# Patient Record
Sex: Female | Born: 1940 | Race: White | Hispanic: No | Smoking: Never smoker
Health system: Southern US, Community
[De-identification: ages and names within clinical notes are randomized; demographics above are authoritative.]

## PROBLEM LIST (undated history)

## (undated) DIAGNOSIS — Z5189 Encounter for other specified aftercare: Secondary | ICD-10-CM

## (undated) DIAGNOSIS — N186 End stage renal disease: Secondary | ICD-10-CM

## (undated) DIAGNOSIS — E119 Type 2 diabetes mellitus without complications: Secondary | ICD-10-CM

## (undated) DIAGNOSIS — D649 Anemia, unspecified: Secondary | ICD-10-CM

## (undated) DIAGNOSIS — E079 Disorder of thyroid, unspecified: Secondary | ICD-10-CM

## (undated) DIAGNOSIS — E039 Hypothyroidism, unspecified: Secondary | ICD-10-CM

## (undated) DIAGNOSIS — Z992 Dependence on renal dialysis: Secondary | ICD-10-CM

## (undated) DIAGNOSIS — E785 Hyperlipidemia, unspecified: Secondary | ICD-10-CM

## (undated) DIAGNOSIS — H269 Unspecified cataract: Secondary | ICD-10-CM

## (undated) DIAGNOSIS — R51 Headache: Secondary | ICD-10-CM

## (undated) DIAGNOSIS — K2970 Gastritis, unspecified, without bleeding: Secondary | ICD-10-CM

## (undated) DIAGNOSIS — R519 Headache, unspecified: Secondary | ICD-10-CM

## (undated) DIAGNOSIS — I2510 Atherosclerotic heart disease of native coronary artery without angina pectoris: Secondary | ICD-10-CM

## (undated) DIAGNOSIS — I10 Essential (primary) hypertension: Secondary | ICD-10-CM

## (undated) DIAGNOSIS — I5042 Chronic combined systolic (congestive) and diastolic (congestive) heart failure: Secondary | ICD-10-CM

## (undated) DIAGNOSIS — I480 Paroxysmal atrial fibrillation: Secondary | ICD-10-CM

## (undated) DIAGNOSIS — K219 Gastro-esophageal reflux disease without esophagitis: Secondary | ICD-10-CM

## (undated) DIAGNOSIS — I214 Non-ST elevation (NSTEMI) myocardial infarction: Secondary | ICD-10-CM

## (undated) DIAGNOSIS — M1990 Unspecified osteoarthritis, unspecified site: Secondary | ICD-10-CM

## (undated) DIAGNOSIS — M109 Gout, unspecified: Secondary | ICD-10-CM

## (undated) DIAGNOSIS — D509 Iron deficiency anemia, unspecified: Secondary | ICD-10-CM

## (undated) DIAGNOSIS — Z972 Presence of dental prosthetic device (complete) (partial): Secondary | ICD-10-CM

## (undated) DIAGNOSIS — N183 Chronic kidney disease, stage 3 (moderate): Secondary | ICD-10-CM

## (undated) HISTORY — DX: Encounter for other specified aftercare: Z51.89

## (undated) HISTORY — DX: Hyperlipidemia, unspecified: E78.5

## (undated) HISTORY — PX: CATARACT EXTRACTION: SUR2

## (undated) HISTORY — DX: Anemia, unspecified: D64.9

## (undated) HISTORY — PX: THYROID SURGERY: SHX805

## (undated) HISTORY — PX: WRIST SURGERY: SHX841

## (undated) HISTORY — DX: Atherosclerotic heart disease of native coronary artery without angina pectoris: I25.10

## (undated) HISTORY — DX: Chronic kidney disease, stage 3 (moderate): N18.3

## (undated) HISTORY — DX: Unspecified cataract: H26.9

## (undated) HISTORY — DX: Non-ST elevation (NSTEMI) myocardial infarction: I21.4

## (undated) HISTORY — DX: Chronic combined systolic (congestive) and diastolic (congestive) heart failure: I50.42

## (undated) HISTORY — DX: Gastro-esophageal reflux disease without esophagitis: K21.9

## (undated) HISTORY — PX: OTHER SURGICAL HISTORY: SHX169

## (undated) HISTORY — DX: Disorder of thyroid, unspecified: E07.9

## (undated) HISTORY — DX: Iron deficiency anemia, unspecified: D50.9

---

## 2013-05-01 DIAGNOSIS — E1169 Type 2 diabetes mellitus with other specified complication: Secondary | ICD-10-CM | POA: Insufficient documentation

## 2015-02-05 ENCOUNTER — Inpatient Hospital Stay (HOSPITAL_COMMUNITY)
Admission: EM | Admit: 2015-02-05 | Discharge: 2015-02-10 | DRG: 470 | Disposition: A | Discharge status: Skilled Nursing Facility | Payer: Medicare PPO | Attending: Internal Medicine | Admitting: Internal Medicine

## 2015-02-05 ENCOUNTER — Encounter (HOSPITAL_COMMUNITY): Payer: Self-pay | Admitting: Emergency Medicine

## 2015-02-05 ENCOUNTER — Emergency Department (HOSPITAL_COMMUNITY): Payer: Medicare PPO

## 2015-02-05 DIAGNOSIS — E1165 Type 2 diabetes mellitus with hyperglycemia: Secondary | ICD-10-CM

## 2015-02-05 DIAGNOSIS — M1612 Unilateral primary osteoarthritis, left hip: Secondary | ICD-10-CM | POA: Diagnosis present

## 2015-02-05 DIAGNOSIS — S72009A Fracture of unspecified part of neck of unspecified femur, initial encounter for closed fracture: Secondary | ICD-10-CM | POA: Diagnosis present

## 2015-02-05 DIAGNOSIS — Z833 Family history of diabetes mellitus: Secondary | ICD-10-CM

## 2015-02-05 DIAGNOSIS — S42302A Unspecified fracture of shaft of humerus, left arm, initial encounter for closed fracture: Secondary | ICD-10-CM | POA: Diagnosis not present

## 2015-02-05 DIAGNOSIS — B029 Zoster without complications: Secondary | ICD-10-CM | POA: Diagnosis present

## 2015-02-05 DIAGNOSIS — M109 Gout, unspecified: Secondary | ICD-10-CM | POA: Diagnosis present

## 2015-02-05 DIAGNOSIS — Z79899 Other long term (current) drug therapy: Secondary | ICD-10-CM

## 2015-02-05 DIAGNOSIS — I10 Essential (primary) hypertension: Secondary | ICD-10-CM

## 2015-02-05 DIAGNOSIS — R509 Fever, unspecified: Secondary | ICD-10-CM

## 2015-02-05 DIAGNOSIS — I169 Hypertensive crisis, unspecified: Secondary | ICD-10-CM

## 2015-02-05 DIAGNOSIS — W19XXXA Unspecified fall, initial encounter: Secondary | ICD-10-CM | POA: Diagnosis present

## 2015-02-05 DIAGNOSIS — S42202A Unspecified fracture of upper end of left humerus, initial encounter for closed fracture: Secondary | ICD-10-CM | POA: Diagnosis present

## 2015-02-05 DIAGNOSIS — M1000 Idiopathic gout, unspecified site: Secondary | ICD-10-CM

## 2015-02-05 DIAGNOSIS — N390 Urinary tract infection, site not specified: Secondary | ICD-10-CM | POA: Diagnosis not present

## 2015-02-05 DIAGNOSIS — E1122 Type 2 diabetes mellitus with diabetic chronic kidney disease: Secondary | ICD-10-CM

## 2015-02-05 DIAGNOSIS — N184 Chronic kidney disease, stage 4 (severe): Secondary | ICD-10-CM

## 2015-02-05 DIAGNOSIS — D649 Anemia, unspecified: Secondary | ICD-10-CM | POA: Diagnosis present

## 2015-02-05 DIAGNOSIS — Z66 Do not resuscitate: Secondary | ICD-10-CM | POA: Diagnosis present

## 2015-02-05 DIAGNOSIS — S72012A Unspecified intracapsular fracture of left femur, initial encounter for closed fracture: Principal | ICD-10-CM | POA: Diagnosis present

## 2015-02-05 DIAGNOSIS — B9620 Unspecified Escherichia coli [E. coli] as the cause of diseases classified elsewhere: Secondary | ICD-10-CM | POA: Diagnosis not present

## 2015-02-05 DIAGNOSIS — Z419 Encounter for procedure for purposes other than remedying health state, unspecified: Secondary | ICD-10-CM

## 2015-02-05 DIAGNOSIS — S72002A Fracture of unspecified part of neck of left femur, initial encounter for closed fracture: Secondary | ICD-10-CM | POA: Diagnosis not present

## 2015-02-05 HISTORY — DX: Gout, unspecified: M10.9

## 2015-02-05 HISTORY — DX: Unspecified osteoarthritis, unspecified site: M19.90

## 2015-02-05 HISTORY — DX: Type 2 diabetes mellitus without complications: E11.9

## 2015-02-05 HISTORY — DX: Essential (primary) hypertension: I10

## 2015-02-05 LAB — BASIC METABOLIC PANEL
ANION GAP: 9 (ref 5–15)
BUN: 25 mg/dL — AB (ref 6–20)
CALCIUM: 9 mg/dL (ref 8.9–10.3)
CO2: 25 mmol/L (ref 22–32)
Chloride: 105 mmol/L (ref 101–111)
Creatinine, Ser: 1.37 mg/dL — ABNORMAL HIGH (ref 0.44–1.00)
GFR calc Af Amer: 43 mL/min — ABNORMAL LOW (ref >60)
GFR, EST NON AFRICAN AMERICAN: 37 mL/min — AB (ref >60)
GLUCOSE: 254 mg/dL — AB (ref 65–99)
Potassium: 4.7 mmol/L (ref 3.5–5.1)
Sodium: 139 mmol/L (ref 135–145)

## 2015-02-05 LAB — CBC WITH DIFFERENTIAL/PLATELET
BASOS ABS: 0 10*3/uL (ref 0.0–0.1)
BASOS PCT: 0 % (ref 0–1)
EOS ABS: 0.1 10*3/uL (ref 0.0–0.7)
EOS PCT: 0 % (ref 0–5)
HCT: 30.5 % — ABNORMAL LOW (ref 36.0–46.0)
Hemoglobin: 10 g/dL — ABNORMAL LOW (ref 12.0–15.0)
Lymphocytes Relative: 8 % — ABNORMAL LOW (ref 12–46)
Lymphs Abs: 1 10*3/uL (ref 0.7–4.0)
MCH: 29.6 pg (ref 26.0–34.0)
MCHC: 32.8 g/dL (ref 30.0–36.0)
MCV: 90.2 fL (ref 78.0–100.0)
MONO ABS: 0.5 10*3/uL (ref 0.1–1.0)
Monocytes Relative: 4 % (ref 3–12)
NEUTROS ABS: 10.6 10*3/uL — AB (ref 1.7–7.7)
Neutrophils Relative %: 88 % — ABNORMAL HIGH (ref 43–77)
PLATELETS: 268 10*3/uL (ref 150–400)
RBC: 3.38 MIL/uL — ABNORMAL LOW (ref 3.87–5.11)
RDW: 15.9 % — AB (ref 11.5–15.5)
WBC: 12.2 10*3/uL — ABNORMAL HIGH (ref 4.0–10.5)

## 2015-02-05 LAB — GLUCOSE, CAPILLARY
Glucose-Capillary: 229 mg/dL — ABNORMAL HIGH (ref 65–99)
Glucose-Capillary: 252 mg/dL — ABNORMAL HIGH (ref 65–99)

## 2015-02-05 LAB — PROTIME-INR
INR: 1.13 (ref 0.00–1.49)
PROTHROMBIN TIME: 14.7 s (ref 11.6–15.2)

## 2015-02-05 LAB — ABO/RH: ABO/RH(D): B POS

## 2015-02-05 LAB — CBG MONITORING, ED: GLUCOSE-CAPILLARY: 221 mg/dL — AB (ref 65–99)

## 2015-02-05 LAB — TYPE AND SCREEN
ABO/RH(D): B POS
Antibody Screen: NEGATIVE

## 2015-02-05 MED ORDER — GABAPENTIN 300 MG PO CAPS
300.0000 mg | ORAL_CAPSULE | Freq: Every day | ORAL | Status: DC
  Administered 2015-02-05 – 2015-02-09 (×5): 300 mg via ORAL
  Filled 2015-02-05 (×5): qty 1

## 2015-02-05 MED ORDER — INSULIN ASPART 100 UNIT/ML ~~LOC~~ SOLN
0.0000 [IU] | Freq: Three times a day (TID) | SUBCUTANEOUS | Status: DC
  Administered 2015-02-05 – 2015-02-06 (×2): 3 [IU] via SUBCUTANEOUS
  Administered 2015-02-06: 2 [IU] via SUBCUTANEOUS
  Administered 2015-02-06: 3 [IU] via SUBCUTANEOUS
  Administered 2015-02-07: 7 [IU] via SUBCUTANEOUS
  Administered 2015-02-07: 3 [IU] via SUBCUTANEOUS
  Administered 2015-02-08: 2 [IU] via SUBCUTANEOUS
  Administered 2015-02-08: 9 [IU] via SUBCUTANEOUS
  Administered 2015-02-08: 2 [IU] via SUBCUTANEOUS
  Administered 2015-02-09: 3 [IU] via SUBCUTANEOUS
  Filled 2015-02-05: qty 1

## 2015-02-05 MED ORDER — MORPHINE SULFATE (PF) 2 MG/ML IV SOLN
2.0000 mg | INTRAVENOUS | Status: DC | PRN
  Administered 2015-02-05 – 2015-02-08 (×9): 2 mg via INTRAVENOUS
  Filled 2015-02-05 (×9): qty 1

## 2015-02-05 MED ORDER — FEBUXOSTAT 40 MG PO TABS
40.0000 mg | ORAL_TABLET | Freq: Every day | ORAL | Status: DC
  Administered 2015-02-05 – 2015-02-09 (×5): 40 mg via ORAL
  Filled 2015-02-05 (×6): qty 1

## 2015-02-05 MED ORDER — INSULIN ASPART 100 UNIT/ML ~~LOC~~ SOLN
0.0000 [IU] | Freq: Every day | SUBCUTANEOUS | Status: DC
  Administered 2015-02-05: 3 [IU] via SUBCUTANEOUS
  Administered 2015-02-06: 2 [IU] via SUBCUTANEOUS
  Administered 2015-02-07 – 2015-02-08 (×2): 3 [IU] via SUBCUTANEOUS

## 2015-02-05 MED ORDER — ONDANSETRON HCL 4 MG/2ML IJ SOLN: 4.0000 mg | Freq: Three times a day (TID) | INTRAMUSCULAR | Status: AC | PRN

## 2015-02-05 MED ORDER — SODIUM CHLORIDE 0.9 % IV SOLN: INTRAVENOUS | Status: DC

## 2015-02-05 MED ORDER — ACETAMINOPHEN 325 MG PO TABS: 650.0000 mg | ORAL_TABLET | Freq: Four times a day (QID) | ORAL | Status: DC | PRN

## 2015-02-05 MED ORDER — DILTIAZEM HCL ER COATED BEADS 120 MG PO CP24
120.0000 mg | ORAL_CAPSULE | Freq: Every day | ORAL | Status: DC
  Administered 2015-02-05 – 2015-02-09 (×5): 120 mg via ORAL
  Filled 2015-02-05 (×6): qty 1

## 2015-02-05 MED ORDER — SODIUM CHLORIDE 0.9 % IV SOLN
1000.0000 mL | INTRAVENOUS | Status: DC
  Administered 2015-02-05: 1000 mL via INTRAVENOUS

## 2015-02-05 MED ORDER — ALUM & MAG HYDROXIDE-SIMETH 200-200-20 MG/5ML PO SUSP: 30.0000 mL | Freq: Four times a day (QID) | ORAL | Status: DC | PRN

## 2015-02-05 MED ORDER — ADULT MULTIVITAMIN W/MINERALS CH
1.0000 | ORAL_TABLET | Freq: Every day | ORAL | Status: DC
  Administered 2015-02-05 – 2015-02-09 (×5): 1 via ORAL
  Filled 2015-02-05 (×6): qty 1

## 2015-02-05 MED ORDER — HEPARIN SODIUM (PORCINE) 5000 UNIT/ML IJ SOLN
5000.0000 [IU] | Freq: Three times a day (TID) | INTRAMUSCULAR | Status: DC
  Administered 2015-02-05 – 2015-02-10 (×13): 5000 [IU] via SUBCUTANEOUS
  Filled 2015-02-05 (×14): qty 1

## 2015-02-05 MED ORDER — SODIUM CHLORIDE 0.9 % IV SOLN
INTRAVENOUS | Status: DC
  Administered 2015-02-06: 1000 mL via INTRAVENOUS
  Administered 2015-02-09 – 2015-02-10 (×3): via INTRAVENOUS

## 2015-02-05 MED ORDER — HYDROMORPHONE HCL 1 MG/ML IJ SOLN
0.5000 mg | Freq: Once | INTRAMUSCULAR | Status: AC
  Administered 2015-02-05: 0.5 mg via INTRAVENOUS
  Filled 2015-02-05: qty 1

## 2015-02-05 MED ORDER — MORPHINE SULFATE (PF) 4 MG/ML IV SOLN
4.0000 mg | Freq: Once | INTRAVENOUS | Status: AC
  Administered 2015-02-05: 4 mg via INTRAMUSCULAR
  Filled 2015-02-05: qty 1

## 2015-02-05 MED ORDER — ONDANSETRON HCL 4 MG PO TABS: 4.0000 mg | ORAL_TABLET | Freq: Four times a day (QID) | ORAL | Status: DC | PRN

## 2015-02-05 MED ORDER — HYDROCODONE-ACETAMINOPHEN 5-325 MG PO TABS
1.0000 | ORAL_TABLET | ORAL | Status: AC | PRN
  Filled 2015-02-05: qty 2

## 2015-02-05 MED ORDER — ACETAMINOPHEN 650 MG RE SUPP: 650.0000 mg | Freq: Four times a day (QID) | RECTAL | Status: DC | PRN

## 2015-02-05 MED ORDER — HYDROCODONE-ACETAMINOPHEN 5-325 MG PO TABS
1.0000 | ORAL_TABLET | ORAL | Status: DC | PRN
  Administered 2015-02-06 – 2015-02-07 (×3): 2 via ORAL
  Administered 2015-02-08 – 2015-02-10 (×3): 1 via ORAL
  Filled 2015-02-05: qty 2
  Filled 2015-02-05: qty 1
  Filled 2015-02-05: qty 2
  Filled 2015-02-05 (×2): qty 1

## 2015-02-05 MED ORDER — DOCUSATE SODIUM 100 MG PO CAPS
100.0000 mg | ORAL_CAPSULE | Freq: Every day | ORAL | Status: DC
  Administered 2015-02-05 – 2015-02-09 (×5): 100 mg via ORAL
  Filled 2015-02-05 (×5): qty 1

## 2015-02-05 MED ORDER — ONDANSETRON HCL 4 MG/2ML IJ SOLN: 4.0000 mg | Freq: Four times a day (QID) | INTRAMUSCULAR | Status: DC | PRN

## 2015-02-05 NOTE — ED Notes (Signed)
Bed: WA29 Expected date:  Expected time:  Means of arrival:  Comments: Triage 3

## 2015-02-05 NOTE — H&P (Addendum)
[facility] History and Physical  [name] [id number] DOB: 10-07-1940 DOA: 02/05/2015   PCP: No primary care provider on file.    Chief Complaint: fall and pain in left shoulder and left hip  HPI: [name] is a 74 y.o. female with HTN and DM who is visiting from [place]. While doing the laundry today, she lost her footing and fell onto her left sided and subsequently had pain in her left hip and shoulder. No loss of consciousness. She was brought to the ER and is found to have a left humeral and left hip fracture. Pain is currently moderate but worse when moved. No other complaints.    General: The patient denies anorexia, fever, weight loss Cardiac: Denies chest pain, syncope, palpitations, pedal edema  Respiratory: Denies cough, shortness of breath, wheezing GI: Denies severe indigestion/heartburn, abdominal pain, nausea, vomiting, diarrhea and constipation GU: Denies hematuria, incontinence, dysuria  Musculoskeletal: pain mentioned in HPI Skin: Denies suspicious skin lesions Neurologic: Denies focal weakness or numbness, change in vision Psychiatry: Denies depression or anxiety. Hematologic: no easy bruising or bleeding  All other systems reviewed and found to be negative.  Past Medical History  Diagnosis Date  . Diabetes   . Arthritis   . HTN (hypertension)   . Gout     History reviewed. No pertinent past surgical history.  Social History: does not smoke or drink Lives at home alone - she is widowed    No Known Allergies  Family history: Mother had DM    Prior to Admission medications   Medication Sig Start Date End Date Taking? Authorizing Provider  CALCIUM PO Take 1 tablet by mouth daily.   Yes Historical Provider, MD  CARTIA XT 180 MG 24 hr capsule Take 1 tablet by mouth daily. 11/19/14  Yes Historical Provider, MD  clobetasol (TEMOVATE) 0.05 % external solution Apply 1 application topically daily as needed. rash 01/10/15  Yes Historical  Provider, MD  gabapentin (NEURONTIN) 300 MG capsule Take 1 capsule by mouth at bedtime. 01/04/15  Yes Historical Provider, MD  glipiZIDE (GLUCOTROL) 10 MG tablet Take 1 tablet by mouth 2 (two) times daily. 01/22/15  Yes Historical Provider, MD  Multiple Vitamin (MULTIVITAMIN WITH MINERALS) TABS tablet Take 1 tablet by mouth daily.   Yes Historical Provider, MD  naproxen sodium (ANAPROX) 220 MG tablet Take 220 mg by mouth daily as needed (pain).   Yes Historical Provider, MD  triamcinolone cream (KENALOG) 0.1 % Apply 1 application topically daily as needed. rash 12/17/14  Yes Historical Provider, MD  ULORIC 40 MG tablet Take 1 tablet by mouth daily. 12/21/14  Yes Historical Provider, MD     Physical Exam: Filed Vitals:   02/05/15 1145 02/05/15 1402  BP: 138/59 106/90  Pulse: 76 71  Temp: 97.6 F (36.4 C)   TempSrc: Oral   Resp: 18 16  SpO2: 94% 90%     General: elderly woman laying in bed-  mild distress when moved for foley placement HEENT: Normocephalic and Atraumatic, Mucous membranes pink                PERRLA; EOM intact; No scleral icterus,                 Nares: Patent, Oropharynx: Clear, Fair Dentition                 Neck: FROM, no cervical lymphadenopathy, thyromegaly, carotid bruit or JVD;  Breasts: deferred CHEST WALL: No tenderness  CHEST: Normal respiration,  clear to auscultation bilaterally  HEART: Regular rate and rhythm; no murmurs rubs or gallops  BACK: No kyphosis or scoliosis; no CVA tenderness  GI: Positive Bowel Sounds, soft, non-tender; no masses, no organomegaly Rectal Exam: deferred MSK: No cyanosis, clubbing, or edema Genitalia: not examined  SKIN:  no rash or ulceration  CNS: Alert and Oriented x 4, Nonfocal exam, CN 2-12 intact  Labs on Admission:  Basic Metabolic Panel: No results for input(s): NA, K, CL, CO2, GLUCOSE, BUN, CREATININE, CALCIUM, MG, PHOS in the last 168 hours. Liver Function Tests: No results for input(s): AST, ALT, ALKPHOS, BILITOT,  PROT, ALBUMIN in the last 168 hours. No results for input(s): LIPASE, AMYLASE in the last 168 hours. No results for input(s): AMMONIA in the last 168 hours. CBC:  Recent Labs Lab 02/05/15 1524  WBC 12.2*  NEUTROABS 10.6*  HGB 10.0*  HCT 30.5*  MCV 90.2  PLT 268   Cardiac Enzymes: No results for input(s): CKTOTAL, CKMB, CKMBINDEX, TROPONINI in the last 168 hours.  BNP (last 3 results) No results for input(s): BNP in the last 8760 hours.  ProBNP (last 3 results) No results for input(s): PROBNP in the last 8760 hours.  CBG: No results for input(s): GLUCAP in the last 168 hours.  Radiological Exams on Admission: Dg Shoulder Left  02/05/2015   CLINICAL DATA:  [place] today.  Pain.  EXAM: LEFT SHOULDER - 2+ VIEW  COMPARISON:  None.  FINDINGS: There is an impacted and comminuted humeral neck fracture, without apparent dislocation. Avulsed greater tuberosity. Soft tissue swelling.  IMPRESSION: Impacted and comminuted humeral neck fracture without apparent dislocation.   Electronically Signed   By: Staci Righter M.D.   On: 02/05/2015 14:09   Dg Hip Unilat With Pelvis 2-3 Views Left  02/05/2015   CLINICAL DATA:  Fall with left hip pain.  Initial encounter.  EXAM: DG HIP (WITH OR WITHOUT PELVIS) 2-3V LEFT  COMPARISON:  None.  FINDINGS: Subcapital left femoral neck fracture with mild impaction. 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  No evidence of pelvic ring fracture or diastasis.  Bulky spondylosis seen in the lower lumbar spine.  IMPRESSION: Mildly impacted subcapital left femoral neck fracture.   Electronically Signed   By: Monte Fantasia M.D.   On: 02/05/2015 14:08    EKG: ordered   Assessment/Plan Principal Problem:   Left femoral fracture/ left shoulder fx - per ortho, Dr Lorin Mercy (ortho), her surgery will be planned for Monday at [facility] and therefore, she is being transferred there - will start Heparin for DVT prophylaxis as surgery is 2 days from today -  can be  held prior to surgery - Vicodin and Morphine for pain control- colace to prevent constipation - obtain EKG for Pre-op assessment  Active Problems:   DM (diabetes mellitus) - hold Glipizide as PO intake may be variable- start sliding scale  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

## 2015-02-05 NOTE — ED Notes (Signed)
Bed: WA20 Expected date:  Expected time:  Means of arrival:  Comments: Fall, hip injury

## 2015-02-05 NOTE — ED Provider Notes (Signed)
CSN: [id number]     Arrival date & time 02/05/15  1141 History   First MD Initiated Contact with Patient 02/05/15 1152     Chief Complaint  Patient presents with  . Shoulder Injury  . Hip Pain     (Consider location/radiation/quality/duration/timing/severity/associated sxs/prior Treatment) HPI Elderly female presents after mechanical fall with pain in her left shoulder, hip. She recalls the entirety of the event, states that she was leaning over to get laundry, fell onto her left side. 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 She denies head trauma, loss of consciousness, subsequent confusion, disorientation, neck pain, visual changes. She has had severe pain in her left shoulder, left hip, worse with any attempted motion. No medication taken for relief   Past Medical History  Diagnosis Date  . Diabetes   . Arthritis   . HTN (hypertension)   . Gout    History reviewed. No pertinent past surgical history. No family history on file. Social History  Substance Use Topics  . Smoking status: None  . Smokeless tobacco: None  . Alcohol Use: None   OB History    No data available     Review of Systems  Constitutional:       Per HPI, otherwise negative  HENT:       Per HPI, otherwise negative  Respiratory:       Per HPI, otherwise negative  Cardiovascular:       Per HPI, otherwise negative  Gastrointestinal: Negative for vomiting.  Endocrine:       Negative aside from HPI  Genitourinary:       Neg aside from HPI   Musculoskeletal:       Per HPI, otherwise negative  Skin: Negative.   Neurological: Negative for syncope.      Allergies  Review of patient's allergies indicates no known allergies.  Home Medications   Prior to Admission medications   Medication Sig Start Date End Date Taking? Authorizing Provider  CALCIUM PO Take 1 tablet by mouth daily.   Yes Historical Provider, MD  CARTIA XT 180 MG 24 hr capsule Take 1 tablet by mouth daily. 11/19/14  Yes  Historical Provider, MD  clobetasol (TEMOVATE) 0.05 % external solution Apply 1 application topically daily as needed. rash 01/10/15  Yes Historical Provider, MD  gabapentin (NEURONTIN) 300 MG capsule Take 1 capsule by mouth at bedtime. 01/04/15  Yes Historical Provider, MD  glipiZIDE (GLUCOTROL) 10 MG tablet Take 1 tablet by mouth 2 (two) times daily. 01/22/15  Yes Historical Provider, MD  Multiple Vitamin (MULTIVITAMIN WITH MINERALS) TABS tablet Take 1 tablet by mouth daily.   Yes Historical Provider, MD  naproxen sodium (ANAPROX) 220 MG tablet Take 220 mg by mouth daily as needed (pain).   Yes Historical Provider, MD  triamcinolone cream (KENALOG) 0.1 % Apply 1 application topically daily as needed. rash 12/17/14  Yes Historical Provider, MD  ULORIC 40 MG tablet Take 1 tablet by mouth daily. 12/21/14  Yes Historical Provider, MD   BP 131/54 mmHg  Pulse 88  Temp(Src) 98.9 F (37.2 C) (Oral)  Resp 16  Ht 5\' 4"  (1.626 m)  Wt 180 lb (81.647 kg)  BMI 30.88 kg/m2  SpO2 92% Physical Exam  Constitutional: She is oriented to person, place, and time. She appears well-developed and well-nourished. No distress.  HENT:  Head: Normocephalic and atraumatic.  Eyes: Conjunctivae and EOM are normal.  Neck: Neck supple. Normal range of motion present.  Cardiovascular: Normal rate and  regular rhythm.   Pulmonary/Chest: Effort normal and breath sounds normal. No stridor. No respiratory distress.  Abdominal: She exhibits no distension.  Musculoskeletal: She exhibits no edema.       Right shoulder: Normal.       Left shoulder: She exhibits decreased range of motion, tenderness, bony tenderness, swelling and pain. She exhibits no effusion and no crepitus.       Left elbow: Normal.       Left wrist: Normal.       Left hip: She exhibits decreased range of motion, tenderness and bony tenderness.       Left knee: Normal.       Left ankle: Normal.  Neurological: She is alert and oriented to person, place, and  time. No cranial nerve deficit.  Skin: Skin is warm and dry.  Psychiatric: She has a normal mood and affect.  Nursing note and vitals reviewed.   ED Course  Procedures (including critical care time) Labs Review Labs Reviewed  BASIC METABOLIC PANEL - Abnormal; Notable for the following:    Glucose, Bld 254 (*)    BUN 25 (*)    Creatinine, Ser 1.37 (*)    GFR calc non Af Amer 37 (*)    GFR calc Af Amer 43 (*)    All other components within normal limits  CBC WITH DIFFERENTIAL/PLATELET - Abnormal; Notable for the following:    WBC 12.2 (*)    RBC 3.38 (*)    Hemoglobin 10.0 (*)    HCT 30.5 (*)    RDW 15.9 (*)    Neutrophils Relative % 88 (*)    Neutro Abs 10.6 (*)    Lymphocytes Relative 8 (*)    All other components within normal limits  GLUCOSE, CAPILLARY - Abnormal; Notable for the following:    Glucose-Capillary 229 (*)    All other components within normal limits  GLUCOSE, CAPILLARY - Abnormal; Notable for the following:    Glucose-Capillary 252 (*)    All other components within normal limits  GLUCOSE, CAPILLARY - Abnormal; Notable for the following:    Glucose-Capillary 187 (*)    All other components within normal limits  CBG MONITORING, ED - Abnormal; Notable for the following:    Glucose-Capillary 221 (*)    All other components within normal limits  MRSA PCR SCREENING  PROTIME-INR  CBC  TYPE AND SCREEN  ABO/RH    Imaging Review Dg Shoulder Left  02/05/2015   CLINICAL DATA:  [place] today.  Pain.  EXAM: LEFT SHOULDER - 2+ VIEW  COMPARISON:  None.  FINDINGS: There is an impacted and comminuted humeral neck fracture, without apparent dislocation. Avulsed greater tuberosity. Soft tissue swelling.  IMPRESSION: Impacted and comminuted humeral neck fracture without apparent dislocation.   Electronically Signed   By: Staci Righter M.D.   On: 02/05/2015 14:09   Dg Hip Unilat With Pelvis 2-3 Views Left  02/05/2015   CLINICAL DATA:  Fall with left hip pain.  Initial  encounter.  EXAM: DG HIP (WITH OR WITHOUT PELVIS) 2-3V LEFT  COMPARISON:  None.  FINDINGS: Subcapital left femoral neck fracture with mild impaction. 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  No evidence of pelvic ring fracture or diastasis.  Bulky spondylosis seen in the lower lumbar spine.  IMPRESSION: Mildly impacted subcapital left femoral neck fracture.   Electronically Signed   By: Monte Fantasia M.D.   On: 02/05/2015 14:08   I have personally reviewed and evaluated these images and lab  results as part of my medical decision-making. Pulse oximetry 100% room air normal  I discussed the patient's case with Dr. Lorin Mercy, who will perform the surgical repair of the patient's injuries.  The patient will be admitted to the hospitalist service.  MDM   Final diagnoses:  Fall, initial encounter  Hip fracture, left, closed, initial encounter  Humerus fracture, left, closed, initial encounter   Patient presents after mechanical fall with pain in the left shoulder, left hip. Patient's is found to have both hip fracture and proximal humerus fracture. Patient is distally neurovascularly intact in both extremities, is awake, alert, without other complaints. Patient does have history of diabetes, has mild renal dysfunction, but is otherwise hemodynamically stable. Patient was admitted for further evaluation, management, repair.  Carmin Muskrat, MD 02/06/15 [phone]

## 2015-02-05 NOTE — ED Notes (Signed)
EMS reports pt fell getting a shirt out of dryer, Left shoulder and hip pain, no visible deformities. CBG 269

## 2015-02-05 NOTE — ED Notes (Signed)
Patient wants more pain medication before going to x-ray.

## 2015-02-06 DIAGNOSIS — W19XXXA Unspecified fall, initial encounter: Secondary | ICD-10-CM

## 2015-02-06 DIAGNOSIS — E1165 Type 2 diabetes mellitus with hyperglycemia: Secondary | ICD-10-CM

## 2015-02-06 LAB — GLUCOSE, CAPILLARY
GLUCOSE-CAPILLARY: 248 mg/dL — AB (ref 65–99)
GLUCOSE-CAPILLARY: 212 mg/dL — AB (ref 65–99)
Glucose-Capillary: 187 mg/dL — ABNORMAL HIGH (ref 65–99)
Glucose-Capillary: 211 mg/dL — ABNORMAL HIGH (ref 65–99)

## 2015-02-06 LAB — ABO/RH: ABO/RH(D): B POS

## 2015-02-06 LAB — MRSA PCR SCREENING: MRSA BY PCR: NEGATIVE

## 2015-02-06 LAB — PREPARE RBC (CROSSMATCH)

## 2015-02-06 MED ORDER — INSULIN ASPART 100 UNIT/ML ~~LOC~~ SOLN
3.0000 [IU] | Freq: Three times a day (TID) | SUBCUTANEOUS | Status: DC
  Administered 2015-02-06 (×2): 3 [IU] via SUBCUTANEOUS

## 2015-02-06 NOTE — Consult Note (Signed)
Reason for Consult:fall with left femoral neck fracture, left proximal humerus fracture Referring Physician: Dr. Latricia [name] is an 74 y.o. female.  HPI: 74 yo female lives in [place] traveling in [place] to visit friends fell by washing machine with above closed injuries. Pain in shoulder and hip left. No head trauma , no LOC. Hx diabetes, HTN , gout.  Some arthritis in her hips , lumbar stiffness.   Past Medical History  Diagnosis Date  . Diabetes   . Arthritis   . HTN (hypertension)   . Gout     History reviewed. No pertinent past surgical history.  No family history on file.  Social History:  has no tobacco, alcohol, and drug history on file.  Allergies: No Known Allergies  Medications: I have reviewed the patient's current medications.  Results for orders placed or performed during the hospital encounter of 02/05/15 (from the past 48 hour(s))  Basic metabolic panel     Status: Abnormal   Collection Time: 02/05/15  3:24 PM  Result Value Ref Range   Sodium 139 135 - 145 mmol/L   Potassium 4.7 3.5 - 5.1 mmol/L   Chloride 105 101 - 111 mmol/L   CO2 25 22 - 32 mmol/L   Glucose, Bld 254 (H) 65 - 99 mg/dL   BUN 25 (H) 6 - 20 mg/dL   Creatinine, Ser 1.37 (H) 0.44 - 1.00 mg/dL   Calcium 9.0 8.9 - 10.3 mg/dL   GFR calc non Af Amer 37 (L) >60 mL/min   GFR calc Af Amer 43 (L) >60 mL/min    Comment: (NOTE) The eGFR has been calculated using the CKD EPI equation. This calculation has not been validated in all clinical situations. eGFR's persistently <60 mL/min signify possible Chronic Kidney Disease.    Anion gap 9 5 - 15  CBC WITH DIFFERENTIAL     Status: Abnormal   Collection Time: 02/05/15  3:24 PM  Result Value Ref Range   WBC 12.2 (H) 4.0 - 10.5 K/uL   RBC 3.38 (L) 3.87 - 5.11 MIL/uL   Hemoglobin 10.0 (L) 12.0 - 15.0 g/dL   HCT 30.5 (L) 36.0 - 46.0 %   MCV 90.2 78.0 - 100.0 fL   MCH 29.6 26.0 - 34.0 pg   MCHC 32.8 30.0 - 36.0 g/dL   RDW 15.9 (H) 11.5  - 15.5 %   Platelets 268 150 - 400 K/uL   Neutrophils Relative % 88 (H) 43 - 77 %   Neutro Abs 10.6 (H) 1.7 - 7.7 K/uL   Lymphocytes Relative 8 (L) 12 - 46 %   Lymphs Abs 1.0 0.7 - 4.0 K/uL   Monocytes Relative 4 3 - 12 %   Monocytes Absolute 0.5 0.1 - 1.0 K/uL   Eosinophils Relative 0 0 - 5 %   Eosinophils Absolute 0.1 0.0 - 0.7 K/uL   Basophils Relative 0 0 - 1 %   Basophils Absolute 0.0 0.0 - 0.1 K/uL  Protime-INR     Status: None   Collection Time: 02/05/15  3:24 PM  Result Value Ref Range   Prothrombin Time 14.7 11.6 - 15.2 seconds   INR 1.13 0.00 - 1.49  ABO/Rh     Status: None   Collection Time: 02/05/15  3:25 PM  Result Value Ref Range   ABO/RH(D) B POS   Type and screen     Status: None   Collection Time: 02/05/15  4:00 PM  Result Value Ref Range  ABO/RH(D) B POS    Antibody Screen NEG    Sample Expiration 02/08/2015   CBG monitoring, ED     Status: Abnormal   Collection Time: 02/05/15  4:43 PM  Result Value Ref Range   Glucose-Capillary 221 (H) 65 - 99 mg/dL  Glucose, capillary     Status: Abnormal   Collection Time: 02/05/15  8:23 PM  Result Value Ref Range   Glucose-Capillary 229 (H) 65 - 99 mg/dL  Glucose, capillary     Status: Abnormal   Collection Time: 02/05/15 10:34 PM  Result Value Ref Range   Glucose-Capillary 252 (H) 65 - 99 mg/dL  MRSA PCR Screening     Status: None   Collection Time: 02/06/15 12:50 AM  Result Value Ref Range   MRSA by PCR NEGATIVE NEGATIVE    Comment:        The GeneXpert MRSA Assay (FDA approved for NASAL specimens only), is one component of a comprehensive MRSA colonization surveillance program. It is not intended to diagnose MRSA infection nor to guide or monitor treatment for MRSA infections.   Glucose, capillary     Status: Abnormal   Collection Time: 02/06/15  6:22 AM  Result Value Ref Range   Glucose-Capillary 187 (H) 65 - 99 mg/dL    Dg Shoulder Left  02/05/2015   CLINICAL DATA:  [place] today.  Pain.  EXAM:  LEFT SHOULDER - 2+ VIEW  COMPARISON:  None.  FINDINGS: There is an impacted and comminuted humeral neck fracture, without apparent dislocation. Avulsed greater tuberosity. Soft tissue swelling.  IMPRESSION: Impacted and comminuted humeral neck fracture without apparent dislocation.   Electronically Signed   By: [name] M.D.   On: 02/05/2015 14:09   Dg Hip Unilat With Pelvis 2-3 Views Left  02/05/2015   CLINICAL DATA:  Fall with left hip pain.  Initial encounter.  EXAM: DG HIP (WITH OR WITHOUT PELVIS) 2-3V LEFT  COMPARISON:  None.  FINDINGS: Subcapital left femoral neck fracture with mild impaction. 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  No evidence of pelvic ring fracture or diastasis.  Bulky spondylosis seen in the lower lumbar spine.  IMPRESSION: Mildly impacted subcapital left femoral neck fracture.   Electronically Signed   By: [name] M.D.   On: 02/05/2015 14:08    Review of Systems  Constitutional: Negative for fever, chills, weight loss and diaphoresis.  Respiratory: Negative for cough, hemoptysis and wheezing.   Cardiovascular: Negative for chest pain.       Pos. HTN  Skin: Negative for rash.  Neurological: Negative for dizziness.  Endo/Heme/Allergies:       Diabetes  Psychiatric/Behavioral: Negative for depression.   Blood pressure 131/54, pulse 88, temperature 98.9 F (37.2 [name]), temperature source Oral, resp. rate 16, height '5\' 4"'  (1.626 m), weight 81.647 kg (180 lb), SpO2 92 %. Physical Exam  Constitutional: She is oriented to person, place, and time. She appears well-developed and well-nourished.  HENT:  Head: Normocephalic.  Eyes: Pupils are equal, round, and reactive to light.  Neck: Normal range of motion.  Cardiovascular: Normal rate.   Respiratory: Effort normal.  GI: Soft.  Musculoskeletal:  Pain with left shouder and left hip movement, pulses intact.   Neurological: She is alert and oriented to person, place, and time.  Skin: Skin is warm  and dry.  Psychiatric: She has a normal mood and affect. Her behavior is normal. Judgment and thought content normal.    Assessment/Plan: Plan left THA due to femoral neck  fx and left hip OA by xray. 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 Plan anterior hip THA then ORIF shouder if stable.  Hgb 10. Will T and X for 2 units.  [name] 02/06/2015, 11:02 AM

## 2015-02-06 NOTE — Progress Notes (Signed)
[facility]                                                                              Patient Demographics  [name], is a 74 y.o. female, DOB - 1940-11-02, [id number]  Admit date - 02/05/2015   Admitting Physician Debbe Odea, MD  Outpatient Primary MD for the patient is No primary care provider on file.  LOS - 1   Chief Complaint  Patient presents with  . Shoulder Injury  . Hip Pain       Brief HPI   [name] is a 74 y.o. female with HTN and DM who is visiting from [place]. While doing the laundry today, she lost her footing and fell onto her left sided and subsequently had pain in her left hip and shoulder. No loss of consciousness. She was brought to the ER and is found to have a left humeral and left hip fracture. Pain is currently moderate but worse when moved. No other complaints.    Assessment & Plan    Principal Problem:   Left Hip fracture status post mechanical fall - Orthopedics consulted, per admitting physician's note, Dr. Lorin Mercy was contacted - Planning for surgery 9/12 - Nothing by mouth after midnight, hold heparin dose after midnight - Patient had no cardiac symptoms, chest pain, shortness of breath, dizziness or a syncopal episode, moderate risk due to her age and comorbidities, can go for surgery.   Active Problems:   DM (diabetes mellitus) -Continue sliding scale insulin, placed on Meal coverage     HTN (hypertension) -Currently stable , continue Cardizem    Gout - Continue uloric    Left humeral fracture - Continue sling for now, further recommendations per orthopedics   Code Status:Full code   Family Communication: Discussed in detail with the patient, all imaging results, lab results explained to the patient  and patient's son, [name] [id number], in [place], on phone   Disposition Plan: Will likely need short-term rehabilitation, patient's son agreeable with the plan   Time Spent in minutes    38minutes  Procedures  None   Consults   ortho  DVT Prophylaxis heparin   Medications  Scheduled Meds: . diltiazem  120 mg Oral Daily  . docusate sodium  100 mg Oral QHS  . febuxostat  40 mg Oral Daily  . gabapentin  300 mg Oral QHS  . heparin  5,000 Units Subcutaneous 3 times per day  . insulin aspart  0-5 Units Subcutaneous QHS  . insulin aspart  0-9 Units Subcutaneous TID WC  . insulin aspart  3 Units Subcutaneous TID WC  . multivitamin with minerals  1 tablet Oral Daily   Continuous Infusions: . sodium chloride 1,000 mL (02/06/15 0556)   PRN Meds:.acetaminophen **OR** acetaminophen, alum & mag hydroxide-simeth, HYDROcodone-acetaminophen, morphine injection, ondansetron **OR** ondansetron (ZOFRAN) IV   Antibiotics   Anti-infectives    None        Subjective:   [name] was seen and examined today.  Patient denies dizziness, chest pain, shortness of breath, abdominal pain, N/V/D/C, new weakness, numbess, tingling. No acute events overnight.  Pain controlled  Objective:   Blood pressure 131/54, pulse 88, temperature 98.9 F (37.2 C), temperature source Oral, resp. rate 16, height 5\' 4"  (1.626 m), weight 81.647 kg (180 lb), SpO2 92 %.  Wt Readings from Last 3 Encounters:  02/05/15 81.647 kg (180 lb)     Intake/Output Summary (Last 24 hours) at 02/06/15 1023 Last data filed at 02/06/15 0730  Gross per 24 hour  Intake    240 ml  Output   1400 ml  Net  -1160 ml    Exam  General: Alert and oriented x 3, NAD  HEENT:  PERRLA, EOMI, Anicteric Sclera, mucous membranes moist.   Neck: Supple, no JVD, no masses  CVS: S1 S2 auscultated, no rubs, murmurs or gallops. Regular rate and rhythm.  Respiratory: Clear to auscultation bilaterally, no wheezing, rales or rhonchi  Abdomen: Soft, nontender, nondistended, + bowel sounds  Ext: no cyanosis clubbing or edema, left shoulder in sling   NeuroNo new deficits  skin: No rashes  Psych: Normal affect  and demeanor, alert and oriented x3    Data Review   Micro Results Recent Results (from the past 240 hour(s))  MRSA PCR Screening     Status: None   Collection Time: 02/06/15 12:50 AM  Result Value Ref Range Status   MRSA by PCR NEGATIVE NEGATIVE Final    Comment:        The GeneXpert MRSA Assay (FDA approved for NASAL specimens only), is one component of a comprehensive MRSA colonization surveillance program. It is not intended to diagnose MRSA infection nor to guide or monitor treatment for MRSA infections.     Radiology Reports Dg Shoulder Left  02/05/2015   CLINICAL DATA:  [place] today.  Pain.  EXAM: LEFT SHOULDER - 2+ VIEW  COMPARISON:  None.  FINDINGS: There is an impacted and comminuted humeral neck fracture, without apparent dislocation. Avulsed greater tuberosity. Soft tissue swelling.  IMPRESSION: Impacted and comminuted humeral neck fracture without apparent dislocation.   Electronically Signed   By: Staci Righter M.D.   On: 02/05/2015 14:09   Dg Hip Unilat With Pelvis 2-3 Views Left  02/05/2015   CLINICAL DATA:  Fall with left hip pain.  Initial encounter.  EXAM: DG HIP (WITH OR WITHOUT PELVIS) 2-3V LEFT  COMPARISON:  None.  FINDINGS: Subcapital left femoral neck fracture with mild impaction. 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  No evidence of pelvic ring fracture or diastasis.  Bulky spondylosis seen in the lower lumbar spine.  IMPRESSION: Mildly impacted subcapital left femoral neck fracture.   Electronically Signed   By: Monte Fantasia M.D.   On: 02/05/2015 14:08    CBC  Recent Labs Lab 02/05/15 1524  WBC 12.2*  HGB 10.0*  HCT 30.5*  PLT 268  MCV 90.2  MCH 29.6  MCHC 32.8  RDW 15.9*  LYMPHSABS 1.0  MONOABS 0.5  EOSABS 0.1  BASOSABS 0.0    Chemistries   Recent Labs Lab 02/05/15 1524  NA 139  K 4.7  CL 105  CO2 25  GLUCOSE 254*  BUN 25*  CREATININE 1.37*  CALCIUM 9.0    ------------------------------------------------------------------------------------------------------------------ estimated creatinine clearance is 36.7 mL/min (by C-G formula based on Cr of 1.37). ------------------------------------------------------------------------------------------------------------------ No results for input(s): HGBA1C in the last 72 hours. ------------------------------------------------------------------------------------------------------------------ No results for input(s): CHOL, HDL, LDLCALC, TRIG, CHOLHDL, LDLDIRECT in the last 72 hours. ------------------------------------------------------------------------------------------------------------------ No results for input(s): TSH, T4TOTAL, T3FREE, THYROIDAB in the last 72 hours.  Invalid input(s): FREET3 ------------------------------------------------------------------------------------------------------------------ No results for  input(s): VITAMINB12, FOLATE, FERRITIN, TIBC, IRON, RETICCTPCT in the last 72 hours.  Coagulation profile  Recent Labs Lab 02/05/15 1524  INR 1.13    No results for input(s): DDIMER in the last 72 hours.  Cardiac Enzymes No results for input(s): CKMB, TROPONINI, MYOGLOBIN in the last 168 hours.  Invalid input(s): CK ------------------------------------------------------------------------------------------------------------------ Invalid input(s): Otho  02/05/15 1643 02/05/15 2023 02/05/15 2234 02/06/15 0622  GLUCAP 221* 51* 37* 187*     Celia Friedland M.D. [facility] 02/06/2015, 10:23 AM  Pager: [phone] Between 7am to 7pm - call Pager - [phone]  After 7pm go to [email or web address] - password [email or web address]  Call night coverage person covering after 7pm

## 2015-02-06 NOTE — Progress Notes (Signed)
Utilization review completed.  

## 2015-02-07 ENCOUNTER — Inpatient Hospital Stay (HOSPITAL_COMMUNITY): Payer: Medicare PPO | Admitting: Anesthesiology

## 2015-02-07 ENCOUNTER — Inpatient Hospital Stay (HOSPITAL_COMMUNITY): Payer: Medicare PPO

## 2015-02-07 ENCOUNTER — Encounter (HOSPITAL_COMMUNITY)
Admission: EM | Disposition: A | Discharge status: Skilled Nursing Facility | Payer: Self-pay | Source: Home / Self Care | Attending: Internal Medicine

## 2015-02-07 ENCOUNTER — Encounter (HOSPITAL_COMMUNITY): Payer: Self-pay | Admitting: Anesthesiology

## 2015-02-07 HISTORY — PX: ORIF HUMERUS FRACTURE: SHX2126

## 2015-02-07 HISTORY — PX: TOTAL HIP ARTHROPLASTY: SHX124

## 2015-02-07 LAB — CBC
HCT: 29.5 % — ABNORMAL LOW (ref 36.0–46.0)
Hemoglobin: 9.7 g/dL — ABNORMAL LOW (ref 12.0–15.0)
MCH: 29.6 pg (ref 26.0–34.0)
MCHC: 32.9 g/dL (ref 30.0–36.0)
MCV: 89.9 fL (ref 78.0–100.0)
PLATELETS: 252 10*3/uL (ref 150–400)
RBC: 3.28 MIL/uL — ABNORMAL LOW (ref 3.87–5.11)
RDW: 16.1 % — AB (ref 11.5–15.5)
WBC: 9.1 10*3/uL (ref 4.0–10.5)

## 2015-02-07 LAB — GLUCOSE, CAPILLARY
GLUCOSE-CAPILLARY: 234 mg/dL — AB (ref 65–99)
Glucose-Capillary: 221 mg/dL — ABNORMAL HIGH (ref 65–99)
Glucose-Capillary: 283 mg/dL — ABNORMAL HIGH (ref 65–99)
Glucose-Capillary: 325 mg/dL — ABNORMAL HIGH (ref 65–99)
Glucose-Capillary: 252 mg/dL — ABNORMAL HIGH (ref 65–99)

## 2015-02-07 LAB — BASIC METABOLIC PANEL
ANION GAP: 8 (ref 5–15)
BUN: 16 mg/dL (ref 6–20)
CALCIUM: 8.6 mg/dL — AB (ref 8.9–10.3)
CO2: 24 mmol/L (ref 22–32)
CREATININE: 1.28 mg/dL — AB (ref 0.44–1.00)
Chloride: 101 mmol/L (ref 101–111)
GFR calc Af Amer: 46 mL/min — ABNORMAL LOW (ref >60)
GFR, EST NON AFRICAN AMERICAN: 40 mL/min — AB (ref >60)
GLUCOSE: 230 mg/dL — AB (ref 65–99)
Potassium: 4.6 mmol/L (ref 3.5–5.1)
Sodium: 133 mmol/L — ABNORMAL LOW (ref 135–145)

## 2015-02-07 SURGERY — ARTHROPLASTY, HIP, TOTAL,POSTERIOR APPROACH
Anesthesia: General | Site: Shoulder | Laterality: Left

## 2015-02-07 MED ORDER — ONDANSETRON HCL 4 MG PO TABS: 4.0000 mg | ORAL_TABLET | Freq: Four times a day (QID) | ORAL | Status: DC | PRN

## 2015-02-07 MED ORDER — PHENOL 1.4 % MT LIQD: 1.0000 | OROMUCOSAL | Status: DC | PRN

## 2015-02-07 MED ORDER — ONDANSETRON HCL 4 MG/2ML IJ SOLN: 4.0000 mg | Freq: Four times a day (QID) | INTRAMUSCULAR | Status: DC | PRN

## 2015-02-07 MED ORDER — ROCURONIUM BROMIDE 100 MG/10ML IV SOLN
INTRAVENOUS | Status: DC | PRN
  Administered 2015-02-07 (×4): 10 mg via INTRAVENOUS
  Administered 2015-02-07: 50 mg via INTRAVENOUS

## 2015-02-07 MED ORDER — PHENYLEPHRINE HCL 10 MG/ML IJ SOLN
INTRAMUSCULAR | Status: DC | PRN
  Administered 2015-02-07 (×5): 80 ug via INTRAVENOUS

## 2015-02-07 MED ORDER — METHOCARBAMOL 1000 MG/10ML IJ SOLN: 500.0000 mg | Freq: Four times a day (QID) | INTRAVENOUS | Status: DC | PRN

## 2015-02-07 MED ORDER — CEFAZOLIN SODIUM-DEXTROSE 2-3 GM-% IV SOLR
INTRAVENOUS | Status: DC | PRN
  Administered 2015-02-07: 2 g via INTRAVENOUS

## 2015-02-07 MED ORDER — ONDANSETRON HCL 4 MG/2ML IJ SOLN: 4.0000 mg | Freq: Once | INTRAMUSCULAR | Status: DC | PRN

## 2015-02-07 MED ORDER — HYDROMORPHONE HCL 1 MG/ML IJ SOLN
0.2500 mg | INTRAMUSCULAR | Status: DC | PRN
  Administered 2015-02-07: 0.25 mg via INTRAVENOUS

## 2015-02-07 MED ORDER — LACTATED RINGERS IV SOLN
INTRAVENOUS | Status: DC | PRN
  Administered 2015-02-07 (×3): via INTRAVENOUS

## 2015-02-07 MED ORDER — MENTHOL 3 MG MT LOZG
1.0000 | LOZENGE | OROMUCOSAL | Status: DC | PRN
  Filled 2015-02-07: qty 9

## 2015-02-07 MED ORDER — SODIUM CHLORIDE 0.9 % IR SOLN
Status: DC | PRN
  Administered 2015-02-07: 1000 mL

## 2015-02-07 MED ORDER — ACETAMINOPHEN 650 MG RE SUPP: 650.0000 mg | Freq: Four times a day (QID) | RECTAL | Status: DC | PRN

## 2015-02-07 MED ORDER — LACTATED RINGERS IV SOLN
INTRAVENOUS | Status: DC
  Administered 2015-02-07: 10:00:00 via INTRAVENOUS

## 2015-02-07 MED ORDER — CEFAZOLIN SODIUM-DEXTROSE 2-3 GM-% IV SOLR
INTRAVENOUS | Status: AC
  Filled 2015-02-07: qty 50

## 2015-02-07 MED ORDER — NEOSTIGMINE METHYLSULFATE 10 MG/10ML IV SOLN
INTRAVENOUS | Status: DC | PRN
  Administered 2015-02-07: 4 mg via INTRAVENOUS

## 2015-02-07 MED ORDER — PHENYLEPHRINE 40 MCG/ML (10ML) SYRINGE FOR IV PUSH (FOR BLOOD PRESSURE SUPPORT)
PREFILLED_SYRINGE | INTRAVENOUS | Status: AC
  Filled 2015-02-07: qty 10

## 2015-02-07 MED ORDER — GLIPIZIDE 5 MG PO TABS
10.0000 mg | ORAL_TABLET | Freq: Two times a day (BID) | ORAL | Status: DC
  Administered 2015-02-07 – 2015-02-09 (×5): 10 mg via ORAL
  Filled 2015-02-07 (×5): qty 2

## 2015-02-07 MED ORDER — PROPOFOL 10 MG/ML IV BOLUS
INTRAVENOUS | Status: AC
  Filled 2015-02-07: qty 20

## 2015-02-07 MED ORDER — PROPOFOL 10 MG/ML IV BOLUS
INTRAVENOUS | Status: DC | PRN
  Administered 2015-02-07: 100 mg via INTRAVENOUS

## 2015-02-07 MED ORDER — ONDANSETRON HCL 4 MG/2ML IJ SOLN
INTRAMUSCULAR | Status: DC | PRN
Start: 2015-02-07 — End: 2015-02-07
  Administered 2015-02-07: 4 mg via INTRAVENOUS

## 2015-02-07 MED ORDER — METOCLOPRAMIDE HCL 5 MG/ML IJ SOLN: 5.0000 mg | Freq: Three times a day (TID) | INTRAMUSCULAR | Status: DC | PRN

## 2015-02-07 MED ORDER — NEOSTIGMINE METHYLSULFATE 10 MG/10ML IV SOLN
INTRAVENOUS | Status: AC
  Filled 2015-02-07: qty 1

## 2015-02-07 MED ORDER — ONDANSETRON HCL 4 MG/2ML IJ SOLN
INTRAMUSCULAR | Status: AC
  Filled 2015-02-07: qty 2

## 2015-02-07 MED ORDER — ASPIRIN EC 325 MG PO TBEC
325.0000 mg | DELAYED_RELEASE_TABLET | Freq: Every day | ORAL | Status: DC
  Administered 2015-02-08 – 2015-02-10 (×3): 325 mg via ORAL
  Filled 2015-02-07 (×3): qty 1

## 2015-02-07 MED ORDER — INSULIN ASPART 100 UNIT/ML ~~LOC~~ SOLN
SUBCUTANEOUS | Status: AC
  Filled 2015-02-07: qty 5

## 2015-02-07 MED ORDER — OXYCODONE HCL 5 MG PO TABS
5.0000 mg | ORAL_TABLET | Freq: Once | ORAL | Status: AC | PRN
  Administered 2015-02-07: 5 mg via ORAL

## 2015-02-07 MED ORDER — SODIUM CHLORIDE 0.9 % IV SOLN
INTRAVENOUS | Status: DC | PRN
  Administered 2015-02-07: 13:00:00 via INTRAVENOUS

## 2015-02-07 MED ORDER — ACETAMINOPHEN 325 MG PO TABS: 650.0000 mg | ORAL_TABLET | Freq: Four times a day (QID) | ORAL | Status: DC | PRN

## 2015-02-07 MED ORDER — LIDOCAINE HCL (CARDIAC) 20 MG/ML IV SOLN
INTRAVENOUS | Status: AC
  Filled 2015-02-07: qty 5

## 2015-02-07 MED ORDER — LIDOCAINE HCL (CARDIAC) 20 MG/ML IV SOLN
INTRAVENOUS | Status: DC | PRN
  Administered 2015-02-07: 80 mg via INTRAVENOUS

## 2015-02-07 MED ORDER — GLYCOPYRROLATE 0.2 MG/ML IJ SOLN
INTRAMUSCULAR | Status: DC | PRN
  Administered 2015-02-07: 0.6 mg via INTRAVENOUS

## 2015-02-07 MED ORDER — ROCURONIUM BROMIDE 50 MG/5ML IV SOLN
INTRAVENOUS | Status: AC
  Filled 2015-02-07: qty 2

## 2015-02-07 MED ORDER — FENTANYL CITRATE (PF) 250 MCG/5ML IJ SOLN
INTRAMUSCULAR | Status: AC
  Filled 2015-02-07: qty 5

## 2015-02-07 MED ORDER — DEXTROSE 5 % IV SOLN
INTRAVENOUS | Status: DC | PRN
  Administered 2015-02-07: 12:00:00 via INTRAVENOUS

## 2015-02-07 MED ORDER — POTASSIUM CHLORIDE IN NACL 20-0.45 MEQ/L-% IV SOLN
INTRAVENOUS | Status: DC
  Administered 2015-02-07: 19:00:00 via INTRAVENOUS
  Filled 2015-02-07 (×5): qty 1000

## 2015-02-07 MED ORDER — OXYCODONE-ACETAMINOPHEN 5-325 MG PO TABS
1.0000 | ORAL_TABLET | Freq: Once | ORAL | Status: DC | PRN
  Administered 2015-02-09 – 2015-02-10 (×2): 1 via ORAL
  Filled 2015-02-07 (×2): qty 1

## 2015-02-07 MED ORDER — OXYCODONE HCL 5 MG/5ML PO SOLN: 5.0000 mg | Freq: Once | ORAL | Status: AC | PRN

## 2015-02-07 MED ORDER — FENTANYL CITRATE (PF) 100 MCG/2ML IJ SOLN
INTRAMUSCULAR | Status: DC | PRN
  Administered 2015-02-07 (×8): 50 ug via INTRAVENOUS

## 2015-02-07 MED ORDER — GLYCOPYRROLATE 0.2 MG/ML IJ SOLN
INTRAMUSCULAR | Status: AC
  Filled 2015-02-07: qty 3

## 2015-02-07 MED ORDER — METHOCARBAMOL 500 MG PO TABS
500.0000 mg | ORAL_TABLET | Freq: Four times a day (QID) | ORAL | Status: DC | PRN
  Administered 2015-02-08 (×2): 500 mg via ORAL
  Filled 2015-02-07 (×2): qty 1

## 2015-02-07 MED ORDER — INSULIN ASPART 100 UNIT/ML ~~LOC~~ SOLN
5.0000 [IU] | Freq: Once | SUBCUTANEOUS | Status: AC
  Administered 2015-02-07: 5 [IU] via SUBCUTANEOUS

## 2015-02-07 MED ORDER — METOCLOPRAMIDE HCL 5 MG PO TABS: 5.0000 mg | ORAL_TABLET | Freq: Three times a day (TID) | ORAL | Status: DC | PRN

## 2015-02-07 MED ORDER — OXYCODONE HCL 5 MG PO TABS
ORAL_TABLET | ORAL | Status: AC
  Filled 2015-02-07: qty 1

## 2015-02-07 MED ORDER — HYDROMORPHONE HCL 1 MG/ML IJ SOLN
0.5000 mg | INTRAMUSCULAR | Status: DC | PRN
  Administered 2015-02-08 – 2015-02-09 (×2): 0.5 mg via INTRAVENOUS
  Filled 2015-02-07 (×2): qty 1

## 2015-02-07 MED ORDER — BUPIVACAINE HCL (PF) 0.25 % IJ SOLN
INTRAMUSCULAR | Status: DC | PRN
  Administered 2015-02-07: 30 mL

## 2015-02-07 MED ORDER — CEFAZOLIN SODIUM 1-5 GM-% IV SOLN
1.0000 g | Freq: Three times a day (TID) | INTRAVENOUS | Status: AC
  Administered 2015-02-07 – 2015-02-08 (×2): 1 g via INTRAVENOUS
  Filled 2015-02-07 (×2): qty 50

## 2015-02-07 MED ORDER — OXYCODONE HCL 5 MG PO TABS
5.0000 mg | ORAL_TABLET | ORAL | Status: DC | PRN
  Administered 2015-02-08: 10 mg via ORAL
  Administered 2015-02-09 – 2015-02-10 (×2): 5 mg via ORAL
  Filled 2015-02-07: qty 2
  Filled 2015-02-07 (×2): qty 1

## 2015-02-07 MED ORDER — HYDROMORPHONE HCL 1 MG/ML IJ SOLN
INTRAMUSCULAR | Status: AC
  Filled 2015-02-07: qty 1

## 2015-02-07 SURGICAL SUPPLY — 111 items
BENZOIN TINCTURE PRP APPL 2/3 (GAUZE/BANDAGES/DRESSINGS) ×4 IMPLANT
BIT DRILL 3.2 (BIT) ×2
BIT DRILL 3.2XCALB NS DISP (BIT) ×2 IMPLANT
BIT DRILL CALIBRATED 2.7 (BIT) ×3 IMPLANT
BIT DRILL CALIBRATED 2.7MM (BIT) ×1
BIT DRL 3.2XCALB NS DISP (BIT) ×2
BLADE SAW SAG 73X25 THK (BLADE) ×2
BLADE SAW SGTL 73X25 THK (BLADE) ×2 IMPLANT
BLADE SURG ROTATE 9660 (MISCELLANEOUS) IMPLANT
BNDG COHESIVE 4X5 TAN STRL (GAUZE/BANDAGES/DRESSINGS) ×4 IMPLANT
BRUSH FEMORAL CANAL (MISCELLANEOUS) IMPLANT
CAPT HIP TOTAL 2 ×4 IMPLANT
CLOSURE WOUND 1/2 X4 (GAUZE/BANDAGES/DRESSINGS)
COVER BACK TABLE 24X17X13 BIG (DRAPES) IMPLANT
COVER SURGICAL LIGHT HANDLE (MISCELLANEOUS) ×4 IMPLANT
DECANTER SPIKE VIAL GLASS SM (MISCELLANEOUS) ×4 IMPLANT
DRAPE C-ARM 42X72 X-RAY (DRAPES) ×8 IMPLANT
DRAPE IMP U-DRAPE 54X76 (DRAPES) ×8 IMPLANT
DRAPE INCISE IOBAN 66X45 STRL (DRAPES) ×4 IMPLANT
DRAPE ORTHO SPLIT 77X108 STRL (DRAPES) ×8
DRAPE SURG 17X23 STRL (DRAPES) ×4 IMPLANT
DRAPE SURG ORHT 6 SPLT 77X108 (DRAPES) ×8 IMPLANT
DRAPE U-SHAPE 47X51 STRL (DRAPES) ×4 IMPLANT
DRILL BIT 7/64X5 (BIT) IMPLANT
DRSG EMULSION OIL 3X3 NADH (GAUZE/BANDAGES/DRESSINGS) ×4 IMPLANT
DRSG MEPILEX BORDER 4X12 (GAUZE/BANDAGES/DRESSINGS) ×8 IMPLANT
DRSG MEPILEX BORDER 4X4 (GAUZE/BANDAGES/DRESSINGS) ×8 IMPLANT
DRSG MEPILEX BORDER 4X8 (GAUZE/BANDAGES/DRESSINGS) IMPLANT
DRSG PAD ABDOMINAL 8X10 ST (GAUZE/BANDAGES/DRESSINGS) IMPLANT
DURAPREP 26ML APPLICATOR (WOUND CARE) ×4 IMPLANT
ELECT BLADE 4.0 EZ CLEAN MEGAD (MISCELLANEOUS) ×4
ELECT CAUTERY BLADE 6.4 (BLADE) ×8 IMPLANT
ELECT REM PT RETURN 9FT ADLT (ELECTROSURGICAL) ×4
ELECTRODE BLDE 4.0 EZ CLN MEGD (MISCELLANEOUS) ×2 IMPLANT
ELECTRODE REM PT RTRN 9FT ADLT (ELECTROSURGICAL) ×2 IMPLANT
EVACUATOR 1/8 PVC DRAIN (DRAIN) IMPLANT
FACESHIELD WRAPAROUND (MASK) ×8 IMPLANT
GAUZE SPONGE 4X4 12PLY STRL (GAUZE/BANDAGES/DRESSINGS) ×4 IMPLANT
GAUZE XEROFORM 5X9 LF (GAUZE/BANDAGES/DRESSINGS) ×4 IMPLANT
GLOVE BIOGEL PI IND STRL 8 (GLOVE) ×4 IMPLANT
GLOVE BIOGEL PI INDICATOR 8 (GLOVE) ×4
GLOVE ORTHO TXT STRL SZ7.5 (GLOVE) ×8 IMPLANT
GOWN STRL REUS W/ TWL LRG LVL3 (GOWN DISPOSABLE) ×2 IMPLANT
GOWN STRL REUS W/ TWL XL LVL3 (GOWN DISPOSABLE) ×2 IMPLANT
GOWN STRL REUS W/TWL 2XL LVL3 (GOWN DISPOSABLE) ×4 IMPLANT
GOWN STRL REUS W/TWL LRG LVL3 (GOWN DISPOSABLE) ×2
GOWN STRL REUS W/TWL XL LVL3 (GOWN DISPOSABLE) ×2
HANDPIECE INTERPULSE COAX TIP (DISPOSABLE)
IMMOBILIZER KNEE 20 (SOFTGOODS) IMPLANT
IMMOBILIZER KNEE 22 UNIV (SOFTGOODS) IMPLANT
IMMOBILIZER KNEE 24 THIGH 36 (MISCELLANEOUS) IMPLANT
IMMOBILIZER KNEE 24 UNIV (MISCELLANEOUS)
K-WIRE 2X5 SS THRDED S3 (WIRE) ×12
KIT BASIN OR (CUSTOM PROCEDURE TRAY) ×4 IMPLANT
KIT ROOM TURNOVER OR (KITS) ×4 IMPLANT
KWIRE 2X5 SS THRDED S3 (WIRE) ×6 IMPLANT
MANIFOLD NEPTUNE II (INSTRUMENTS) ×4 IMPLANT
NEEDLE 1/2 CIR MAYO (NEEDLE) ×4 IMPLANT
NEEDLE HYPO 25GX1X1/2 BEV (NEEDLE) ×4 IMPLANT
NEEDLE MAYO .5 CIRCLE (NEEDLE) IMPLANT
NS IRRIG 1000ML POUR BTL (IV SOLUTION) ×4 IMPLANT
PACK SHOULDER (CUSTOM PROCEDURE TRAY) ×4 IMPLANT
PACK TOTAL JOINT (CUSTOM PROCEDURE TRAY) ×4 IMPLANT
PACK UNIVERSAL I (CUSTOM PROCEDURE TRAY) ×4 IMPLANT
PAD ARMBOARD 7.5X6 YLW CONV (MISCELLANEOUS) ×8 IMPLANT
PEG LOCKING 3.2MMX26MM (Peg) ×8 IMPLANT
PEG LOCKING 3.2MMX44 (Peg) ×4 IMPLANT
PEG LOCKING 3.2MMX46 (Peg) ×8 IMPLANT
PEG LOCKING 3.2X 28MM (Peg) ×4 IMPLANT
PEG LOCKING 3.2X34 (Screw) ×8 IMPLANT
PEG LOCKING 3.2X36 (Screw) ×8 IMPLANT
PENCIL BUTTON HOLSTER BLD 10FT (ELECTRODE) ×4 IMPLANT
PIN STEINMAN 3/16 (PIN) IMPLANT
PLATE PROX HUM HI L 3H 80 (Plate) ×4 IMPLANT
PRESSURIZER FEMORAL UNIV (MISCELLANEOUS) IMPLANT
SCREW LAG  RD HEAD 4.0 30 LTH (Screw) ×8 IMPLANT
SCREW LAG  RD HEAD 4.0 44 LTH (Screw) ×2 IMPLANT
SCREW LAG RD HEAD 4.0 44 LTH (Screw) ×2 IMPLANT
SCREW PEG LOCK 3.2X30MM (Screw) ×4 IMPLANT
SCREW T15 MD 3.5X26MM NS (Screw) ×12 IMPLANT
SCREW T15 MD 3.5X28MM NS (Screw) ×4 IMPLANT
SET HNDPC FAN SPRY TIP SCT (DISPOSABLE) IMPLANT
SLEEVE MEASURING 3.2 (BIT) ×4 IMPLANT
SPONGE LAP 18X18 X RAY DECT (DISPOSABLE) ×8 IMPLANT
SPONGE LAP 4X18 X RAY DECT (DISPOSABLE) ×16 IMPLANT
STAPLER VISISTAT 35W (STAPLE) ×4 IMPLANT
STOCKINETTE IMPERVIOUS 9X36 MD (GAUZE/BANDAGES/DRESSINGS) ×4 IMPLANT
STRIP CLOSURE SKIN 1/2X4 (GAUZE/BANDAGES/DRESSINGS) IMPLANT
SUCTION FRAZIER TIP 10 FR DISP (SUCTIONS) ×4 IMPLANT
SUT BONE WAX W31G (SUTURE) ×4 IMPLANT
SUT ETHIBOND NAB CT1 #1 30IN (SUTURE) ×12 IMPLANT
SUT FIBERWIRE #2 38 T-5 BLUE (SUTURE)
SUT TICRON (SUTURE) ×8 IMPLANT
SUT VIC AB 0 CT1 27 (SUTURE)
SUT VIC AB 0 CT1 27XBRD ANBCTR (SUTURE) IMPLANT
SUT VIC AB 2-0 CT1 27 (SUTURE) ×8
SUT VIC AB 2-0 CT1 TAPERPNT 27 (SUTURE) ×8 IMPLANT
SUT VIC AB 3-0 FS2 27 (SUTURE) ×4 IMPLANT
SUT VICRYL 0 TIES 12 18 (SUTURE) IMPLANT
SUT VICRYL 4-0 PS2 18IN ABS (SUTURE) ×4 IMPLANT
SUT VLOC 180 0 24IN GS25 (SUTURE) ×4 IMPLANT
SUTURE FIBERWR #2 38 T-5 BLUE (SUTURE) IMPLANT
SYR CONTROL 10ML LL (SYRINGE) ×4 IMPLANT
TOWEL OR 17X24 6PK STRL BLUE (TOWEL DISPOSABLE) ×4 IMPLANT
TOWEL OR 17X26 10 PK STRL BLUE (TOWEL DISPOSABLE) ×4 IMPLANT
TOWER CARTRIDGE SMART MIX (DISPOSABLE) IMPLANT
TRAY FOLEY CATH 16FRSI W/METER (SET/KITS/TRAYS/PACK) IMPLANT
TUBE CONNECTING 12'X1/4 (SUCTIONS) ×1
TUBE CONNECTING 12X1/4 (SUCTIONS) ×3 IMPLANT
WATER STERILE IRR 1000ML POUR (IV SOLUTION) IMPLANT
WIRE K 1.6MM 144256 (MISCELLANEOUS) ×12 IMPLANT

## 2015-02-07 NOTE — Progress Notes (Signed)
Inpatient Diabetes Program Recommendations  AACE/ADA: New Consensus Statement on Inpatient Glycemic Control (2013)  Target Ranges:  Prepandial:   less than 140 mg/dL      Peak postprandial:   less than 180 mg/dL (1-2 hours)      Critically ill patients:  140 - 180 mg/dL   Glycemic Control Review:  Results for DEBRIA, [place] (MRN [id number]) as of 02/07/2015 13:57  Ref. Range 02/06/2015 06:22 02/06/2015 11:44 02/06/2015 16:34 02/06/2015 21:20 02/07/2015 06:56 02/07/2015 09:37  Glucose-Capillary Latest Ref Range: 65-99 mg/dL 187 (H) 248 (H) 212 (H) 211 (H) 234 (H) 221 (H)   Diabetes history: Type 2 Outpatient Diabetes medications: Glucotrol 10 mg bid Current orders for Inpatient glycemic control: Novolog 3 units tid with meals and Sensitive correction tid  Note:  Has been NPO for surgery today.  Presently in OR.  In addition to meal coverage and correction, recommend Lantus 16 units at HS in effort to improve glycemic control.  Thank you.  Emerson Schreifels S. Marcelline Mates, RN, CNS, CDE Inpatient Diabetes Program, team pager [phone] (8am to 5pm)

## 2015-02-07 NOTE — Anesthesia Postprocedure Evaluation (Signed)
  Anesthesia Post-op Note  Patient: [name]  Procedure(s) Performed: Procedure(s): TOTAL HIP ARTHROPLASTY ANTERIOR APPROACH  (Left) OPEN REDUCTION INTERNAL FIXATION (ORIF) PROXIMAL HUMERUS FRACTURE (Left)  Patient Location: PACU  Anesthesia Type:General  Level of Consciousness: awake and alert   Airway and Oxygen Therapy: Patient Spontanous Breathing  Post-op Pain: mild  Post-op Assessment: Post-op Vital signs reviewed LLE Motor Response: Purposeful movement, Responds to commands LLE Sensation: No numbness          Post-op Vital Signs: stable  Last Vitals:  Filed Vitals:   02/07/15 1722  BP:   Pulse: 84  Temp:   Resp: 15    Complications: No apparent anesthesia complications

## 2015-02-07 NOTE — Anesthesia Preprocedure Evaluation (Addendum)
Anesthesia Evaluation  Patient identified by MRN, date of birth, ID band Patient awake    Reviewed: Allergy & Precautions, H&P , NPO status , Patient's Chart, lab work & pertinent test results  History of Anesthesia Complications Negative for: history of anesthetic complications  Airway Mallampati: IV  TM Distance: >3 FB Neck ROM: full    Dental  (+) Edentulous Upper, Lower Dentures   Pulmonary neg pulmonary ROS,    Pulmonary exam normal breath sounds clear to auscultation       Cardiovascular hypertension, Pt. on medications Normal cardiovascular exam Rhythm:regular Rate:Normal     Neuro/Psych negative neurological ROS     GI/Hepatic negative GI ROS, Neg liver ROS,   Endo/Other  diabetes, Oral Hypoglycemic Agents  Renal/GU negative Renal ROS     Musculoskeletal  (+) Arthritis ,   Abdominal   Peds  Hematology negative hematology ROS (+)   Anesthesia Other Findings Denies cardiac or pulmonary symptoms, no smoking, no CAD  Reproductive/Obstetrics negative OB ROS                          Anesthesia Physical Anesthesia Plan  ASA: III  Anesthesia Plan: General   Post-op Pain Management:    Induction: Intravenous  Airway Management Planned: Oral ETT  Additional Equipment:   Intra-op Plan:   Post-operative Plan: Extubation in OR  Informed Consent: I have reviewed the patients History and Physical, chart, labs and discussed the procedure including the risks, benefits and alternatives for the proposed anesthesia with the patient or authorized representative who has indicated his/her understanding and acceptance.     Plan Discussed with: Anesthesiologist, CRNA and Surgeon  Anesthesia Plan Comments: (Hip fracture and humerus fracture after mechanical fall, GA with ETT)      Anesthesia Quick Evaluation

## 2015-02-07 NOTE — H&P (View-Only) (Signed)
Reason for Consult:fall with left femoral neck fracture, left proximal humerus fracture Referring Physician: Dr. Latricia Heft is an 74 y.o. female.  HPI: 74 yo female lives in [place] traveling in [place] to visit friends fell by washing machine with above closed injuries. Pain in shoulder and hip left. No head trauma , no LOC. Hx diabetes, HTN , gout.  Some arthritis in her hips , lumbar stiffness.   Past Medical History  Diagnosis Date  . Diabetes   . Arthritis   . HTN (hypertension)   . Gout     History reviewed. No pertinent past surgical history.  No family history on file.  Social History:  has no tobacco, alcohol, and drug history on file.  Allergies: No Known Allergies  Medications: I have reviewed the patient's current medications.  Results for orders placed or performed during the hospital encounter of 02/05/15 (from the past 48 hour(s))  Basic metabolic panel     Status: Abnormal   Collection Time: 02/05/15  3:24 PM  Result Value Ref Range   Sodium 139 135 - 145 mmol/L   Potassium 4.7 3.5 - 5.1 mmol/L   Chloride 105 101 - 111 mmol/L   CO2 25 22 - 32 mmol/L   Glucose, Bld 254 (H) 65 - 99 mg/dL   BUN 25 (H) 6 - 20 mg/dL   Creatinine, Ser 1.37 (H) 0.44 - 1.00 mg/dL   Calcium 9.0 8.9 - 10.3 mg/dL   GFR calc non Af Amer 37 (L) >60 mL/min   GFR calc Af Amer 43 (L) >60 mL/min    Comment: (NOTE) The eGFR has been calculated using the CKD EPI equation. This calculation has not been validated in all clinical situations. eGFR's persistently <60 mL/min signify possible Chronic Kidney Disease.    Anion gap 9 5 - 15  CBC WITH DIFFERENTIAL     Status: Abnormal   Collection Time: 02/05/15  3:24 PM  Result Value Ref Range   WBC 12.2 (H) 4.0 - 10.5 K/uL   RBC 3.38 (L) 3.87 - 5.11 MIL/uL   Hemoglobin 10.0 (L) 12.0 - 15.0 g/dL   HCT 30.5 (L) 36.0 - 46.0 %   MCV 90.2 78.0 - 100.0 fL   MCH 29.6 26.0 - 34.0 pg   MCHC 32.8 30.0 - 36.0 g/dL   RDW 15.9 (H) 11.5  - 15.5 %   Platelets 268 150 - 400 K/uL   Neutrophils Relative % 88 (H) 43 - 77 %   Neutro Abs 10.6 (H) 1.7 - 7.7 K/uL   Lymphocytes Relative 8 (L) 12 - 46 %   Lymphs Abs 1.0 0.7 - 4.0 K/uL   Monocytes Relative 4 3 - 12 %   Monocytes Absolute 0.5 0.1 - 1.0 K/uL   Eosinophils Relative 0 0 - 5 %   Eosinophils Absolute 0.1 0.0 - 0.7 K/uL   Basophils Relative 0 0 - 1 %   Basophils Absolute 0.0 0.0 - 0.1 K/uL  Protime-INR     Status: None   Collection Time: 02/05/15  3:24 PM  Result Value Ref Range   Prothrombin Time 14.7 11.6 - 15.2 seconds   INR 1.13 0.00 - 1.49  ABO/Rh     Status: None   Collection Time: 02/05/15  3:25 PM  Result Value Ref Range   ABO/RH(D) B POS   Type and screen     Status: None   Collection Time: 02/05/15  4:00 PM  Result Value Ref Range  ABO/RH(D) B POS    Antibody Screen NEG    Sample Expiration 02/08/2015   CBG monitoring, ED     Status: Abnormal   Collection Time: 02/05/15  4:43 PM  Result Value Ref Range   Glucose-Capillary 221 (H) 65 - 99 mg/dL  Glucose, capillary     Status: Abnormal   Collection Time: 02/05/15  8:23 PM  Result Value Ref Range   Glucose-Capillary 229 (H) 65 - 99 mg/dL  Glucose, capillary     Status: Abnormal   Collection Time: 02/05/15 10:34 PM  Result Value Ref Range   Glucose-Capillary 252 (H) 65 - 99 mg/dL  MRSA PCR Screening     Status: None   Collection Time: 02/06/15 12:50 AM  Result Value Ref Range   MRSA by PCR NEGATIVE NEGATIVE    Comment:        The GeneXpert MRSA Assay (FDA approved for NASAL specimens only), is one component of a comprehensive MRSA colonization surveillance program. It is not intended to diagnose MRSA infection nor to guide or monitor treatment for MRSA infections.   Glucose, capillary     Status: Abnormal   Collection Time: 02/06/15  6:22 AM  Result Value Ref Range   Glucose-Capillary 187 (H) 65 - 99 mg/dL    Dg Shoulder Left  02/05/2015   CLINICAL DATA:  [place] today.  Pain.  EXAM:  LEFT SHOULDER - 2+ VIEW  COMPARISON:  None.  FINDINGS: There is an impacted and comminuted humeral neck fracture, without apparent dislocation. Avulsed greater tuberosity. Soft tissue swelling.  IMPRESSION: Impacted and comminuted humeral neck fracture without apparent dislocation.   Electronically Signed   By: Staci Righter M.D.   On: 02/05/2015 14:09   Dg Hip Unilat With Pelvis 2-3 Views Left  02/05/2015   CLINICAL DATA:  Fall with left hip pain.  Initial encounter.  EXAM: DG HIP (WITH OR WITHOUT PELVIS) 2-3V LEFT  COMPARISON:  None.  FINDINGS: Subcapital left femoral neck fracture with mild impaction. 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  No evidence of pelvic ring fracture or diastasis.  Bulky spondylosis seen in the lower lumbar spine.  IMPRESSION: Mildly impacted subcapital left femoral neck fracture.   Electronically Signed   By: Monte Fantasia M.D.   On: 02/05/2015 14:08    Review of Systems  Constitutional: Negative for fever, chills, weight loss and diaphoresis.  Respiratory: Negative for cough, hemoptysis and wheezing.   Cardiovascular: Negative for chest pain.       Pos. HTN  Skin: Negative for rash.  Neurological: Negative for dizziness.  Endo/Heme/Allergies:       Diabetes  Psychiatric/Behavioral: Negative for depression.   Blood pressure 131/54, pulse 88, temperature 98.9 F (37.2 C), temperature source Oral, resp. rate 16, height '5\' 4"'  (1.626 m), weight 81.647 kg (180 lb), SpO2 92 %. Physical Exam  Constitutional: She is oriented to person, place, and time. She appears well-developed and well-nourished.  HENT:  Head: Normocephalic.  Eyes: Pupils are equal, round, and reactive to light.  Neck: Normal range of motion.  Cardiovascular: Normal rate.   Respiratory: Effort normal.  GI: Soft.  Musculoskeletal:  Pain with left shouder and left hip movement, pulses intact.   Neurological: She is alert and oriented to person, place, and time.  Skin: Skin is warm  and dry.  Psychiatric: She has a normal mood and affect. Her behavior is normal. Judgment and thought content normal.    Assessment/Plan: Plan left THA due to femoral neck  fx and left hip OA by xray. 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 Plan anterior hip THA then ORIF shouder if stable.  Hgb 10. Will T and X for 2 units.  Malakai Schoenherr C 02/06/2015, 11:02 AM

## 2015-02-07 NOTE — Transfer of Care (Signed)
Immediate Anesthesia Transfer of Care Note  Patient: [name]  Procedure(s) Performed: Procedure(s): TOTAL HIP ARTHROPLASTY ANTERIOR APPROACH  (Left) OPEN REDUCTION INTERNAL FIXATION (ORIF) PROXIMAL HUMERUS FRACTURE (Left)  Patient Location: PACU  Anesthesia Type:General  Level of Consciousness: awake and patient cooperative  Airway & Oxygen Therapy: Patient Spontanous Breathing and Patient connected to nasal cannula oxygen  Post-op Assessment: Report given to RN and Post -op Vital signs reviewed and stable  Post vital signs: Reviewed and stable  Last Vitals:  Filed Vitals:   02/07/15 0909  BP: 127/50  Pulse: 95  Temp: 37.2 C  Resp: 16    Complications: No apparent anesthesia complications

## 2015-02-07 NOTE — Anesthesia Procedure Notes (Signed)
Procedure Name: Intubation Date/Time: 02/07/2015 11:20 AM Performed by: Williemae Area B Pre-anesthesia Checklist: Patient identified, Emergency Drugs available, Suction available and Patient being monitored Patient Re-evaluated:Patient Re-evaluated prior to inductionOxygen Delivery Method: Circle system utilized Preoxygenation: Pre-oxygenation with 100% oxygen Intubation Type: IV induction Ventilation: Mask ventilation without difficulty Laryngoscope Size: Mac and 3 Grade View: Grade II Tube type: Oral Tube size: 7.5 mm Number of attempts: 1 Airway Equipment and Method: Stylet Placement Confirmation: ETT inserted through vocal cords under direct vision,  positive ETCO2 and breath sounds checked- equal and bilateral Secured at: 21 (cm at upper gum) cm Tube secured with: Tape Dental Injury: Teeth and Oropharynx as per pre-operative assessment

## 2015-02-07 NOTE — Progress Notes (Signed)
[facility]                                                                              Patient Demographics  [name], is a 74 y.o. female, DOB - 08/09/1940, [id number]  Admit date - 02/05/2015   Admitting Physician Debbe Odea, MD  Outpatient Primary MD for the patient is No primary care provider on file.  LOS - 2   Chief Complaint  Patient presents with  . Shoulder Injury  . Hip Pain       Brief HPI   [name] is a 74 y.o. female with HTN and DM who is visiting from [place]. While doing the laundry today, she lost her footing and fell onto her left sided and subsequently had pain in her left hip and shoulder. No loss of consciousness. She was brought to the ER and is found to have a left humeral and left hip fracture. Pain is currently moderate but worse when moved. No other complaints.    Assessment & Plan    Principal Problem:   Left Hip fracture status post mechanical fall - Orthopedics consulted, Planning for surgery today, NPO - Patient had no cardiac symptoms, chest pain, shortness of breath, dizziness or a syncopal episode, moderate risk due to her age and comorbidities, can go for surgery.  - Follow CBC post op - DVT prophylaxis per orthopedics  Active Problems:   DM (diabetes mellitus) -Continue sliding scale insulin, placed on Meal coverage     HTN (hypertension) -Currently stable , continue Cardizem    Gout - Continue uloric    Left humeral fracture - Continue sling for now, further recommendations per orthopedics   Code Status:Full code   Family Communication: Discussed in detail with the patient, all imaging results, lab results explained to the patient  and patient's son, Israa [name] [id number], in [place], on phone yesterday   Disposition Plan: Will likely need short-term rehabilitation, patient's son and patient agreeable with the plan   Time Spent in minutes   34minutes  Procedures  None   Consults    ortho  DVT Prophylaxis heparin   Medications  Scheduled Meds: . diltiazem  120 mg Oral Daily  . docusate sodium  100 mg Oral QHS  . febuxostat  40 mg Oral Daily  . gabapentin  300 mg Oral QHS  . heparin  5,000 Units Subcutaneous 3 times per day  . insulin aspart  0-5 Units Subcutaneous QHS  . insulin aspart  0-9 Units Subcutaneous TID WC  . insulin aspart  3 Units Subcutaneous TID WC  . multivitamin with minerals  1 tablet Oral Daily   Continuous Infusions: . sodium chloride 1,000 mL (02/06/15 0556)  . lactated ringers 10 mL/hr at 02/07/15 0947   PRN Meds:.acetaminophen **OR** acetaminophen, alum & mag hydroxide-simeth, bupivacaine (PF), HYDROcodone-acetaminophen, morphine injection, ondansetron **OR** ondansetron (ZOFRAN) IV, sodium chloride irrigation   Antibiotics   Anti-infectives    None        Subjective:   Donnae Donna was seen and examined today. Denies any complaints, awaiting surgery.  Patient denies dizziness, chest pain, shortness of breath, abdominal pain,  N/V/D/C, new weakness, numbess, tingling.   Objective:   Blood pressure 127/50, pulse 95, temperature 98.9 F (37.2 C), temperature source Oral, resp. rate 16, height 5\' 4"  (1.626 m), weight 81.647 kg (180 lb), SpO2 92 %.  Wt Readings from Last 3 Encounters:  02/05/15 81.647 kg (180 lb)     Intake/Output Summary (Last 24 hours) at 02/07/15 1235 Last data filed at 02/07/15 1215  Gross per 24 hour  Intake   1290 ml  Output   2250 ml  Net   -960 ml    Exam  General: Alert and oriented x 3, NAD  HEENT:  PERRLA, EOMI  Neck: Supple, no JVD, no masses  CVS: S1 S2 clear, RRR  Respiratory: CTAB  Abdomen: Soft, nontender, nondistended, + bowel sounds  Ext: no cyanosis clubbing or edema, left shoulder in sling   NeuroNo new deficits  skin: No rashes  Psych: Normal affect and demeanor, alert and oriented x3    Data Review   Micro Results Recent Results (from the past 240  hour(s))  MRSA PCR Screening     Status: None   Collection Time: 02/06/15 12:50 AM  Result Value Ref Range Status   MRSA by PCR NEGATIVE NEGATIVE Final    Comment:        The GeneXpert MRSA Assay (FDA approved for NASAL specimens only), is one component of a comprehensive MRSA colonization surveillance program. It is not intended to diagnose MRSA infection nor to guide or monitor treatment for MRSA infections.     Radiology Reports Dg Shoulder Left  02/05/2015   CLINICAL DATA:  [place] today.  Pain.  EXAM: LEFT SHOULDER - 2+ VIEW  COMPARISON:  None.  FINDINGS: There is an impacted and comminuted humeral neck fracture, without apparent dislocation. Avulsed greater tuberosity. Soft tissue swelling.  IMPRESSION: Impacted and comminuted humeral neck fracture without apparent dislocation.   Electronically Signed   By: Staci Righter M.D.   On: 02/05/2015 14:09   Dg Hip Unilat With Pelvis 2-3 Views Left  02/05/2015   CLINICAL DATA:  Fall with left hip pain.  Initial encounter.  EXAM: DG HIP (WITH OR WITHOUT PELVIS) 2-3V LEFT  COMPARISON:  None.  FINDINGS: Subcapital left femoral neck fracture with mild impaction. 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  No evidence of pelvic ring fracture or diastasis.  Bulky spondylosis seen in the lower lumbar spine.  IMPRESSION: Mildly impacted subcapital left femoral neck fracture.   Electronically Signed   By: Monte Fantasia M.D.   On: 02/05/2015 14:08    CBC  Recent Labs Lab 02/05/15 1524 02/07/15 0505  WBC 12.2* 9.1  HGB 10.0* 9.7*  HCT 30.5* 29.5*  PLT 268 252  MCV 90.2 89.9  MCH 29.6 29.6  MCHC 32.8 32.9  RDW 15.9* 16.1*  LYMPHSABS 1.0  --   MONOABS 0.5  --   EOSABS 0.1  --   BASOSABS 0.0  --     Chemistries   Recent Labs Lab 02/05/15 1524 02/07/15 0505  NA 139 133*  K 4.7 4.6  CL 105 101  CO2 25 24  GLUCOSE 254* 230*  BUN 25* 16  CREATININE 1.37* 1.28*  CALCIUM 9.0 8.6*    ------------------------------------------------------------------------------------------------------------------ estimated creatinine clearance is 39.3 mL/min (by C-G formula based on Cr of 1.28). ------------------------------------------------------------------------------------------------------------------ No results for input(s): HGBA1C in the last 72 hours. ------------------------------------------------------------------------------------------------------------------ No results for input(s): CHOL, HDL, LDLCALC, TRIG, CHOLHDL, LDLDIRECT in the last 72 hours. ------------------------------------------------------------------------------------------------------------------ No results for input(s):  TSH, T4TOTAL, T3FREE, THYROIDAB in the last 72 hours.  Invalid input(s): FREET3 ------------------------------------------------------------------------------------------------------------------ No results for input(s): VITAMINB12, FOLATE, FERRITIN, TIBC, IRON, RETICCTPCT in the last 72 hours.  Coagulation profile  Recent Labs Lab 02/05/15 1524  INR 1.13    No results for input(s): DDIMER in the last 72 hours.  Cardiac Enzymes No results for input(s): CKMB, TROPONINI, MYOGLOBIN in the last 168 hours.  Invalid input(s): CK ------------------------------------------------------------------------------------------------------------------ Invalid input(s): [place]  02/06/15 0622 02/06/15 1144 02/06/15 1634 02/06/15 2120 02/07/15 0656 02/07/15 0937  GLUCAP 187* 248* 212* 73* 44* 48*     RAI,RIPUDEEP M.D. [facility] 02/07/2015, 12:35 PM  Pager: [phone] Between 7am to 7pm - call Pager - [phone]  After 7pm go to [email or web address] - password [email or web address]  Call night coverage person covering after 7pm

## 2015-02-07 NOTE — Brief Op Note (Signed)
02/05/2015 - 02/07/2015  3:33 PM  PATIENT:  Gaynelle Arabian Revere  74 y.o. female  PRE-OPERATIVE DIAGNOSIS:  left proximal humerus fx  POST-OPERATIVE DIAGNOSIS:  Left Proximal Humerus Fx  PROCEDURE:  Procedure(s): TOTAL HIP ARTHROPLASTY ANTERIOR APPROACH  (Left) OPEN REDUCTION INTERNAL FIXATION (ORIF) PROXIMAL HUMERUS FRACTURE (Left)  SURGEON:  Surgeon(s) and Role:    * Marybelle Killings, MD - Primary  PHYSICIAN ASSISTANT: Mahira Petras m. Ricard Dillon ANESTHESIA:   general  EBL:  Total I/O In: 2370 [I.V.:1700; Blood:670] Out: 925 [Urine:525; Blood:400]   DRAINS: none   LOCAL MEDICATIONS USED:  NONE  SPECIMEN:  No Specimen  DISPOSITION OF SPECIMEN:  N/A  COUNTS:  YES  TOURNIQUET:  * No tourniquets in log *  DICTATION: .Dragon Dictation PATIENT DISPOSITION:  PACU - hemodynamically stable.

## 2015-02-07 NOTE — Interval H&P Note (Signed)
History and Physical Interval Note:  02/07/2015 10:45 AM  Landry Mellow  has presented today for surgery, with the diagnosis of left proximal humerus fx  The various methods of treatment have been discussed with the patient and family. After consideration of risks, benefits and other options for treatment, the patient has consented to  Procedure(s): TOTAL HIP ARTHROPLASTY ANTERIOR APPROCH .NEEDS HANA TABLE (Left) OPEN REDUCTION INTERNAL FIXATION (ORIF) PROXIMAL HUMERUS FRACTURE (Left) as a surgical intervention .  The patient's history has been reviewed, patient examined, no change in status, stable for surgery.  I have reviewed the patient's chart and labs.  Questions were answered to the patient's satisfaction.     Carsin Randazzo C

## 2015-02-07 NOTE — Progress Notes (Signed)
Later note report was called to short stay

## 2015-02-08 ENCOUNTER — Encounter (HOSPITAL_COMMUNITY): Payer: Self-pay | Admitting: Orthopaedic Surgery

## 2015-02-08 LAB — GLUCOSE, CAPILLARY
GLUCOSE-CAPILLARY: 355 mg/dL — AB (ref 65–99)
GLUCOSE-CAPILLARY: 280 mg/dL — AB (ref 65–99)
Glucose-Capillary: 313 mg/dL — ABNORMAL HIGH (ref 65–99)
Glucose-Capillary: 155 mg/dL — ABNORMAL HIGH (ref 65–99)
Glucose-Capillary: 172 mg/dL — ABNORMAL HIGH (ref 65–99)

## 2015-02-08 LAB — TYPE AND SCREEN
ABO/RH(D): B POS
Antibody Screen: NEGATIVE
Unit division: 0
Unit division: 0

## 2015-02-08 LAB — URINALYSIS, ROUTINE W REFLEX MICROSCOPIC
BILIRUBIN URINE: NEGATIVE
GLUCOSE, UA: 100 mg/dL — AB
KETONES UR: NEGATIVE mg/dL
NITRITE: NEGATIVE
PH: 5 (ref 5.0–8.0)
Protein, ur: 30 mg/dL — AB
Specific Gravity, Urine: 1.01 (ref 1.005–1.030)
Urobilinogen, UA: 0.2 mg/dL (ref 0.0–1.0)

## 2015-02-08 LAB — URINE MICROSCOPIC-ADD ON

## 2015-02-08 LAB — BASIC METABOLIC PANEL
Anion gap: 8 (ref 5–15)
BUN: 19 mg/dL (ref 6–20)
CALCIUM: 7.7 mg/dL — AB (ref 8.9–10.3)
CO2: 23 mmol/L (ref 22–32)
CREATININE: 1.41 mg/dL — AB (ref 0.44–1.00)
Chloride: 100 mmol/L — ABNORMAL LOW (ref 101–111)
GFR calc Af Amer: 41 mL/min — ABNORMAL LOW (ref >60)
GFR, EST NON AFRICAN AMERICAN: 35 mL/min — AB (ref >60)
GLUCOSE: 149 mg/dL — AB (ref 65–99)
Potassium: 5.2 mmol/L — ABNORMAL HIGH (ref 3.5–5.1)
SODIUM: 131 mmol/L — AB (ref 135–145)

## 2015-02-08 LAB — CBC
HCT: 28.1 % — ABNORMAL LOW (ref 36.0–46.0)
Hemoglobin: 9.5 g/dL — ABNORMAL LOW (ref 12.0–15.0)
MCH: 30 pg (ref 26.0–34.0)
MCHC: 33.8 g/dL (ref 30.0–36.0)
MCV: 88.6 fL (ref 78.0–100.0)
PLATELETS: 241 10*3/uL (ref 150–400)
RBC: 3.17 MIL/uL — AB (ref 3.87–5.11)
RDW: 16.2 % — ABNORMAL HIGH (ref 11.5–15.5)
WBC: 10.6 10*3/uL — ABNORMAL HIGH (ref 4.0–10.5)

## 2015-02-08 LAB — POCT I-STAT 4, (NA,K, GLUC, HGB,HCT)
Glucose, Bld: 219 mg/dL — ABNORMAL HIGH (ref 65–99)
HEMATOCRIT: 23 % — AB (ref 36.0–46.0)
HEMOGLOBIN: 7.8 g/dL — AB (ref 12.0–15.0)
Potassium: 4.9 mmol/L (ref 3.5–5.1)
SODIUM: 133 mmol/L — AB (ref 135–145)

## 2015-02-08 MED ORDER — INSULIN ASPART 100 UNIT/ML ~~LOC~~ SOLN
4.0000 [IU] | Freq: Three times a day (TID) | SUBCUTANEOUS | Status: DC
  Administered 2015-02-09: 4 [IU] via SUBCUTANEOUS

## 2015-02-08 MED ORDER — MENTHOL 3 MG MT LOZG: 1.0000 | LOZENGE | OROMUCOSAL | Status: DC | PRN

## 2015-02-08 NOTE — Evaluation (Signed)
Physical Therapy Evaluation Patient Details Name: MALAJAH KADRMAS MRN: [id number] DOB: 1940-11-08 Today's Date: 02/08/2015   History of Present Illness  Pt is a 74 y/o F who was traveling from [place] to [place] in a motor home w/ her friend.  She had a fall while visiting friends in [place] w/ resultant Lt hip and Lt humerus fxs.  Now s/p Lt THA and Lt humerus ORIF.  Pt's PMH includes DM, arthritis, HTN, gout.  Clinical Impression  Patient is s/p above surgery resulting in functional limitations due to the deficits listed below (see PT Problem List). Pt is not from this area; however she will need SNF services upon d/c 2/2 current +2 total assist level for all mobility. Cues for proper breathing technique provided to pt as she demonstrates valsalva maneuver w/ any exertion.  She needed much encouragement to participate in therapy.  Patient will benefit from skilled PT to increase their independence and safety with mobility to allow discharge to the venue listed below.      Follow Up Recommendations SNF;Supervision/Assistance - 24 hour;Supervision for mobility/OOB    Equipment Recommendations  Other (comment) (TBD at next venue of care)    Recommendations for Other Services       Precautions / Restrictions Precautions Precautions: Fall Precaution Comments: Per note, hana table use, likely direct anterior THA.  Strict no mobility of Lt UE. Required Braces or Orthoses: Sling (sling at all times) Restrictions Weight Bearing Restrictions: Yes LUE Weight Bearing: Non weight bearing LLE Weight Bearing: Weight bearing as tolerated      Mobility  Bed Mobility Overal bed mobility: Needs Assistance;+2 for physical assistance Bed Mobility: Supine to Sit     Supine to sit: Total assist;+2 for physical assistance;HOB elevated     General bed mobility comments: Total +2 assist supporting trunk posteriorly and using bed pad to sit up from supine.  HOB elevated and pt w/ heavy use of bed  rail.  Pt leaning to Rt and reporting that her Rt UE is too weak to hold her up in sitting.  Multiple verbal and hand over hand tacile cues to push up to upright.  Transfers Overall transfer level: Needs assistance Equipment used: 2 person hand held assist Transfers: Sit to/from [facility] Sit to Stand: Total assist;+2 physical assistance;+2 safety/equipment Stand pivot transfers: Total assist;+2 physical assistance;+2 safety/equipment       General transfer comment: +2 total assist blocking Bil knees and using bed pad to assist pt to stand and pivot to recliner chair.  Cues for hand placement, pt w/ minimal help an shuffles her Rt foot minimally to pivot.  Ambulation/Gait                Stairs            Wheelchair Mobility    Modified Rankin (Stroke Patients Only)       Balance Overall balance assessment: Needs assistance;History of Falls Sitting-balance support: Single extremity supported;Feet supported Sitting balance-Leahy Scale: Poor Sitting balance - Comments: leaning Rt Postural control: Right lateral lean Standing balance support: Single extremity supported;During functional activity Standing balance-Leahy Scale: Zero                               Pertinent Vitals/Pain Pain Assessment: 0-10 Pain Score: 10-Worst pain ever Pain Location: Lt UE>Lt LE Pain Descriptors / Indicators: Aching;Throbbing Pain Intervention(s): Limited activity within patient's tolerance;Monitored during session;Repositioned;Patient requesting pain meds-RN notified  Home Living Family/patient expects to be discharged to:: Skilled nursing facility Living Arrangements: Non-relatives/Friends (very good friend, [name]) Available Help at Discharge: Friend(s);Available 24 hours/day Type of Home: Other(Comment) (Motor home) Home Access: Stairs to enter Entrance Stairs-Rails: Left Entrance Stairs-Number of Steps: 3 Home Layout: One level Home Equipment:  None      Prior Function Level of Independence: Independent               Hand Dominance        Extremity/Trunk Assessment   Upper Extremity Assessment: Defer to OT evaluation           Lower Extremity Assessment: LLE deficits/detail;Generalized weakness   LLE Deficits / Details: weakness and limited ROM as expected s/p Lt THA     Communication   Communication: No difficulties  Cognition Arousal/Alertness: Lethargic Behavior During Therapy: WFL for tasks assessed/performed Overall Cognitive Status: Within Functional Limits for tasks assessed                      General Comments General comments (skin integrity, edema, etc.): Pt is not from this area; however she will need SNF services upon d/c 2/2 current +2 total assist level.  Cues for proper breathing technique provided to pt as she demonstrates valsalva maneuver w/ any exertion.  She needed much encouragement to participate in therapy.    Exercises Total Joint Exercises Ankle Circles/Pumps: AROM;Both;15 reps;Supine Gluteal Sets: Strengthening;Both;10 reps;Seated      Assessment/Plan    PT Assessment Patient needs continued PT services  PT Diagnosis Difficulty walking;Abnormality of gait;Generalized weakness;Acute pain   PT Problem List Decreased strength;Decreased range of motion;Decreased activity tolerance;Decreased mobility;Decreased balance;Decreased knowledge of use of DME;Decreased safety awareness;Decreased knowledge of precautions;Impaired sensation;Decreased skin integrity;Pain  PT Treatment Interventions DME instruction;Gait training;Stair training;Functional mobility training;Therapeutic activities;Therapeutic exercise;Balance training;Neuromuscular re-education;Patient/family education;Modalities   PT Goals (Current goals can be found in the Care Plan section) Acute Rehab PT Goals Patient Stated Goal: to go to rehab  PT Goal Formulation: With patient Time For Goal Achievement:  02/22/15 Potential to Achieve Goals: Good    Frequency 7X/week   Barriers to discharge Inaccessible home environment steps to enter home    Co-evaluation               End of Session Equipment Utilized During Treatment: Gait belt Activity Tolerance: Patient limited by pain;Patient limited by lethargy;Patient limited by fatigue Patient left: in chair;with call bell/phone within reach;with family/visitor present Nurse Communication: Mobility status;Need for lift equipment;Patient requests pain meds;Precautions;Weight bearing status         Time: 1132-1208 PT Time Calculation (min) (ACUTE ONLY): 36 min   Charges:   PT Evaluation $Initial PT Evaluation Tier I: 1 Procedure PT Treatments $Therapeutic Activity: 8-22 mins   PT G CodesJoslyn Hy PT, DPT [phone] Pager: [phone] 02/08/2015, 12:39 PM

## 2015-02-08 NOTE — Progress Notes (Signed)
[facility]                                                                              Patient Demographics  [name], is a 74 y.o. female, DOB - 1940-07-20, [id number]  Admit date - 02/05/2015   Admitting Physician Debbe Odea, MD  Outpatient Primary MD for the patient is No primary care provider on file.  LOS - 3   Chief Complaint  Patient presents with  . Shoulder Injury  . Hip Pain       Brief HPI   [name] is a 74 y.o. female with HTN and DM who is visiting from [place]. While doing the laundry today, she lost her footing and fell onto her left sided and subsequently had pain in her left hip and shoulder. No loss of consciousness. She was brought to the ER and is found to have a left humeral and left hip fracture. Pain is currently moderate but worse when moved. No other complaints.    Assessment & Plan    Principal Problem:   Left Hip fracture, left humeral fracture status post mechanical fall : postop day #1 - Orthopedics was consulted, patient underwent total left hip arthroplasty, open ORIF proximal humerus fracture left - Hemoglobin stable today 9.5 -  DVT prophylaxis per orthopedics - PT OT evaluation  Active Problems:   DM (diabetes mellitus) -Continue sliding scale insulin, increased meal coverage - Diet changed to carb modified     HTN (hypertension) -Currently stable , continue Cardizem    Gout - Continue uloric  Low-grade temp with tachycardia - Monitor closely, obtain UA and culture, blood cultures - Portable chest x-ray if patient has any worsening fevers or leukocytosis    Code Status:Full code   Family Communication: Discussed in detail with the patient, all imaging results, lab results explained to the patient . Called patient's son, [name] [phone], in [place], on phone , left detailed message   Disposition Plan: Will likely need short-term rehabilitation, patient's son and patient  agreeable with the plan   Time Spent in minutes   53minutes  Procedures  None   Consults   ortho  DVT Prophylaxis heparin   Medications  Scheduled Meds: . aspirin EC  325 mg Oral Q breakfast  . diltiazem  120 mg Oral Daily  . docusate sodium  100 mg Oral QHS  . febuxostat  40 mg Oral Daily  . gabapentin  300 mg Oral QHS  . glipiZIDE  10 mg Oral BID  . heparin  5,000 Units Subcutaneous 3 times per day  . insulin aspart  0-5 Units Subcutaneous QHS  . insulin aspart  0-9 Units Subcutaneous TID WC  . insulin aspart  3 Units Subcutaneous TID WC  . multivitamin with minerals  1 tablet Oral Daily   Continuous Infusions: . 0.45 % NaCl with KCl 20 mEq / L 90 mL/hr at 02/07/15 1839  . sodium chloride 1,000 mL (02/06/15 0556)  . lactated ringers 10 mL/hr at 02/07/15 0947   PRN Meds:.acetaminophen **OR** acetaminophen, alum & mag hydroxide-simeth, HYDROcodone-acetaminophen, HYDROmorphone (DILAUDID) injection, menthol-cetylpyridinium **OR** phenol, methocarbamol **OR** methocarbamol (ROBAXIN)  IV, metoCLOPramide **OR** metoCLOPramide (REGLAN) injection, morphine injection, ondansetron (ZOFRAN) IV, ondansetron **OR** ondansetron (ZOFRAN) IV, oxyCODONE, oxyCODONE-acetaminophen   Antibiotics   Anti-infectives    Start     Dose/Rate Route Frequency Ordered Stop   02/07/15 2030  ceFAZolin (ANCEF) IVPB 1 g/50 mL premix     1 g 100 mL/hr over 30 Minutes Intravenous 3 times per day 02/07/15 1831 02/08/15 0655        Subjective:   [name] was seen and examined today. Feeling miserable today, postop day #1. Low-grade temperature. Otherwise denies any chest pain, coughing. Patient denies dizziness, chest pain, shortness of breath, abdominal pain, N/V/D/C, new weakness, numbess, tingling.   Objective:   Blood pressure 114/54, pulse 114, temperature 100.1 F (37.8 C), temperature source Oral, resp. rate 18, height 5\' 4"  (1.626 m), weight 81.647 kg (180 lb), SpO2 96 %.  Wt  Readings from Last 3 Encounters:  02/05/15 81.647 kg (180 lb)     Intake/Output Summary (Last 24 hours) at 02/08/15 1137 Last data filed at 02/08/15 0900  Gross per 24 hour  Intake   3910 ml  Output   1625 ml  Net   2285 ml    Exam  General: Alert and oriented x 3, NAD  HEENT:  PERRLA, EOMI  Neck: Supple, no JVD, no masses  CVS: S1 S2 clear, RRR  Respiratory: CTAB  Abdomen: Soft, nontender, nondistended, + bowel sounds  Ext: no cyanosis clubbing or edema, dressings intact   NeuroNo new deficits  skin: No rashes  Psych: Normal affect and demeanor, alert and oriented x3    Data Review   Micro Results Recent Results (from the past 240 hour(s))  MRSA PCR Screening     Status: None   Collection Time: 02/06/15 12:50 AM  Result Value Ref Range Status   MRSA by PCR NEGATIVE NEGATIVE Final    Comment:        The GeneXpert MRSA Assay (FDA approved for NASAL specimens only), is one component of a comprehensive MRSA colonization surveillance program. It is not intended to diagnose MRSA infection nor to guide or monitor treatment for MRSA infections.     Radiology Reports Dg Shoulder Left  02/07/2015   CLINICAL DATA:  ORIF left humeral neck fracture  EXAM: DG C-ARM 61-120 MIN; LEFT SHOULDER - 2+ VIEW  COMPARISON:  None.  FINDINGS: Plate and screw fixation device noted within the proximal left humerus. Placement of additional proximal humeral screws. No complicating feature.  IMPRESSION: Internal fixation without visible complicating feature.   Electronically Signed   By: Rolm Baptise M.D.   On: 02/07/2015 15:36   Dg Shoulder Left  02/05/2015   CLINICAL DATA:  [place] today.  Pain.  EXAM: LEFT SHOULDER - 2+ VIEW  COMPARISON:  None.  FINDINGS: There is an impacted and comminuted humeral neck fracture, without apparent dislocation. Avulsed greater tuberosity. Soft tissue swelling.  IMPRESSION: Impacted and comminuted humeral neck fracture without apparent dislocation.    Electronically Signed   By: Staci Righter M.D.   On: 02/05/2015 14:09   Dg C-arm 1-60 Min  02/07/2015   CLINICAL DATA:  ORIF left humeral neck fracture  EXAM: DG C-ARM 61-120 MIN; LEFT SHOULDER - 2+ VIEW  COMPARISON:  None.  FINDINGS: Plate and screw fixation device noted within the proximal left humerus. Placement of additional proximal humeral screws. No complicating feature.  IMPRESSION: Internal fixation without visible complicating feature.   Electronically Signed   By: Rolm Baptise M.D.  On: 02/07/2015 15:36   Dg Hip Operative Unilat With Pelvis Left  02/07/2015   CLINICAL DATA:  Status post total hip arthroplasty  EXAM: OPERATIVE Left HIP (WITH PELVIS IF PERFORMED) 2 VIEWS  TECHNIQUE: Fluoroscopic spot image(s) were submitted for interpretation post-operatively.  FLUOROSCOPY TIME:  0 minutes 27 seconds.  Six submitted images  COMPARISON:  None.  FINDINGS: Frontal and lateral view showed total hip replacement with prosthetic components appearing well-seated. No fracture or dislocation. No bony destruction.  IMPRESSION: Prosthetic components appear well seated. No acute fracture or dislocation.   Electronically Signed   By: Lowella Grip III M.D.   On: 02/07/2015 15:30   Dg Hip Unilat With Pelvis 2-3 Views Left  02/05/2015   CLINICAL DATA:  Fall with left hip pain.  Initial encounter.  EXAM: DG HIP (WITH OR WITHOUT PELVIS) 2-3V LEFT  COMPARISON:  None.  FINDINGS: Subcapital left femoral neck fracture with mild impaction. 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  No evidence of pelvic ring fracture or diastasis.  Bulky spondylosis seen in the lower lumbar spine.  IMPRESSION: Mildly impacted subcapital left femoral neck fracture.   Electronically Signed   By: Monte Fantasia M.D.   On: 02/05/2015 14:08    CBC  Recent Labs Lab 02/05/15 1524 02/07/15 0505 02/07/15 1247 02/08/15 0524  WBC 12.2* 9.1  --  10.6*  HGB 10.0* 9.7* 7.8* 9.5*  HCT 30.5* 29.5* 23.0* 28.1*  PLT 268 252   --  241  MCV 90.2 89.9  --  88.6  MCH 29.6 29.6  --  30.0  MCHC 32.8 32.9  --  33.8  RDW 15.9* 16.1*  --  16.2*  LYMPHSABS 1.0  --   --   --   MONOABS 0.5  --   --   --   EOSABS 0.1  --   --   --   BASOSABS 0.0  --   --   --     Chemistries   Recent Labs Lab 02/05/15 1524 02/07/15 0505 02/07/15 1247 02/08/15 0524  NA 139 133* 133* 131*  K 4.7 4.6 4.9 5.2*  CL 105 101  --  100*  CO2 25 24  --  23  GLUCOSE 254* 230* 219* 149*  BUN 25* 16  --  19  CREATININE 1.37* 1.28*  --  1.41*  CALCIUM 9.0 8.6*  --  7.7*   ------------------------------------------------------------------------------------------------------------------ estimated creatinine clearance is 35.6 mL/min (by C-G formula based on Cr of 1.41). ------------------------------------------------------------------------------------------------------------------ No results for input(s): HGBA1C in the last 72 hours. ------------------------------------------------------------------------------------------------------------------ No results for input(s): CHOL, HDL, LDLCALC, TRIG, CHOLHDL, LDLDIRECT in the last 72 hours. ------------------------------------------------------------------------------------------------------------------ No results for input(s): TSH, T4TOTAL, T3FREE, THYROIDAB in the last 72 hours.  Invalid input(s): FREET3 ------------------------------------------------------------------------------------------------------------------ No results for input(s): VITAMINB12, FOLATE, FERRITIN, TIBC, IRON, RETICCTPCT in the last 72 hours.  Coagulation profile  Recent Labs Lab 02/05/15 1524  INR 1.13    No results for input(s): DDIMER in the last 72 hours.  Cardiac Enzymes No results for input(s): CKMB, TROPONINI, MYOGLOBIN in the last 168 hours.  Invalid input(s): CK ------------------------------------------------------------------------------------------------------------------ Invalid input(s):  [place]  02/07/15 0937 02/07/15 1614 02/07/15 1829 02/07/15 2234 02/08/15 0630 02/08/15 1109  GLUCAP 221* 283* 325* 23* 155* 172*     RAI,RIPUDEEP M.D. [facility] 02/08/2015, 11:37 AM  Pager: [phone] Between 7am to 7pm - call Pager - [phone]  After 7pm go to [email or web address] - password [email or web address]  Call night coverage person  covering after 7pm

## 2015-02-08 NOTE — Progress Notes (Signed)
Subjective: 1 Day Post-Op Procedure(s) (LRB): TOTAL HIP ARTHROPLASTY ANTERIOR APPROACH  (Left) OPEN REDUCTION INTERNAL FIXATION (ORIF) PROXIMAL HUMERUS FRACTURE (Left) Patient reports pain as moderate.  Patient oversedated on pain meds.   Objective: Vital signs in last 24 hours: Temp:  [97.7 F (36.5 C)-100.1 F (37.8 C)] 100.1 F (37.8 C) (09/13 0610) Pulse Rate:  [76-114] 114 (09/13 0610) Resp:  [11-22] 18 (09/13 0610) BP: (106-145)/(50-59) 114/54 mmHg (09/13 0610) SpO2:  [92 %-98 %] 96 % (09/13 0610)  Intake/Output from previous day: 09/12 0701 - 09/13 0700 In: 3670 [I.V.:2950; Blood:670; IV Piggyback:50] Out: [id number] [Urine:1425; Blood:400] Intake/Output this shift:     Recent Labs  02/05/15 1524 02/07/15 0505 02/07/15 1247 02/08/15 0524  HGB 10.0* 9.7* 7.8* 9.5*    Recent Labs  02/07/15 0505 02/07/15 1247 02/08/15 0524  WBC 9.1  --  10.6*  RBC 3.28*  --  3.17*  HCT 29.5* 23.0* 28.1*  PLT 252  --  241    Recent Labs  02/07/15 0505 02/07/15 1247 02/08/15 0524  NA 133* 133* 131*  K 4.6 4.9 5.2*  CL 101  --  100*  CO2 24  --  23  BUN 16  --  19  CREATININE 1.28*  --  1.41*  GLUCOSE 230* 219* 149*  CALCIUM 8.6*  --  7.7*    Recent Labs  02/05/15 1524  INR 1.13    Neurologically intact  Assessment/Plan: 1 Day Post-Op Procedure(s) (LRB): TOTAL HIP ARTHROPLASTY ANTERIOR APPROACH  (Left) OPEN REDUCTION INTERNAL FIXATION (ORIF) PROXIMAL HUMERUS FRACTURE (Left) Up with therapy  Is NWB on left UE ,   WBAT on Left LE .     [name] C 02/08/2015, 8:23 AM

## 2015-02-08 NOTE — Evaluation (Signed)
Occupational Therapy Evaluation Patient Details Name: [name] MRN: [id number] DOB: 10/26/1940 Today's Date: 02/08/2015    History of Present Illness Pt is a 74 y/o F who was traveling from [place] to [place] in a motor home w/ her friend.  She had a fall while visiting friends in [place] w/ resultant Lt hip and Lt humerus fxs.  Now s/p Lt THA and Lt humerus ORIF.  Pt's PMH includes DM, arthritis, HTN, gout.   Clinical Impression   Pt was independent prior to admission.  Presents with pain, impaired attention and cognition (likely medication related) and requires +2 total assist with all mobility and bathing and dressing.  Pt is able to perform one-handed grooming and self feed with set up.  MD orders are specific for no L UE movement and to maintain shoulder immobilizer at all times. Will follow acutely.  Pt will require post acute rehab.     Follow Up Recommendations  SNF;Supervision/Assistance - 24 hour    Equipment Recommendations       Recommendations for Other Services       Precautions / Restrictions Precautions Precautions: Fall Precaution Comments: Per note, hana table use, likely direct anterior THA.  Strict no mobility of Lt UE. Required Braces or Orthoses: Sling (at all times) Restrictions Weight Bearing Restrictions: Yes LUE Weight Bearing: Non weight bearing LLE Weight Bearing: Weight bearing as tolerated      Mobility Bed Mobility Overal bed mobility: Needs Assistance;+2 for physical assistance Bed Mobility: Supine to Sit;Sit to Supine     Supine to sit: Total assist;+2 for physical assistance;HOB elevated Sit to supine: +2 for safety/equipment;Total assist     Transfers Overall transfer level: Needs assistance Equipment used: 2 person hand held assist Transfers: Squat Pivot Transfers Sit to Stand: Total assist;+2 physical assistance;+2 safety/equipment Stand pivot transfers: Total assist;+2 physical assistance;+2 safety/equipment Squat  pivot transfers: +2 physical assistance;Total assist         Balance Overall balance assessment: Needs assistance;History of Falls Sitting-balance support: Single extremity supported;Feet supported Sitting balance-Leahy Scale: Poor Sitting balance - Comments: leaning Rt Postural control: Right lateral lean Standing balance support: Single extremity supported;During functional activity Standing balance-Leahy Scale: Zero                              ADL Overall ADL's : Needs assistance/impaired Eating/Feeding: Set up;Bed level   Grooming: Wash/dry face;Set up;Bed level                               Functional mobility during ADLs: +2 for physical assistance;Total assistance General ADL Comments: Pt requiring +2 total assist for bed level bathing and dressing.     Vision     Perception     Praxis      Pertinent Vitals/Pain Pain Assessment: 0-10 Pain Score: 9  Pain Location: L LE Pain Descriptors / Indicators: Aching;Grimacing;Guarding Pain Intervention(s): Limited activity within patient's tolerance;Monitored during session;RN gave pain meds during session;Repositioned     Hand Dominance Right   Extremity/Trunk Assessment Upper Extremity Assessment Upper Extremity Assessment: RUE deficits/detail;LUE deficits/detail RUE Deficits / Details: immobilized with strict orders for no movement RUE: Unable to fully assess due to immobilization LUE Deficits / Details: generalized weakness   Lower Extremity Assessment Lower Extremity Assessment: Defer to PT evaluation LLE Deficits / Details: weakness and limited ROM as expected s/p Lt THA LLE Sensation: decreased  light touch       Communication Communication Communication: No difficulties   Cognition Arousal/Alertness: Awake/alert Behavior During Therapy: WFL for tasks assessed/performed Overall Cognitive Status: Impaired/Different from baseline Area of Impairment:  Orientation;Attention;Following commands Orientation Level: Disoriented to;Time Current Attention Level: Sustained (repeatedly losing train of thought)   Following Commands: Follows one step commands with increased time           General Comments       Exercises Exercises: Total Joint     Shoulder Instructions      Home Living Family/patient expects to be discharged to:: Skilled nursing facility Living Arrangements: Non-relatives/Friends (very good friend, [name]) Available Help at Discharge: Friend(s);Available 24 hours/day Type of Home: Other(Comment) (RV) Home Access: Stairs to enter Entrance Stairs-Number of Steps: 3 Entrance Stairs-Rails: Left Home Layout: One level               Home Equipment: None          Prior Functioning/Environment Level of Independence: Independent             OT Diagnosis: Generalized weakness;Cognitive deficits;Acute pain   OT Problem List: Decreased strength;Decreased activity tolerance;Impaired balance (sitting and/or standing);Decreased cognition;Decreased knowledge of use of DME or AE;Obesity;Pain;Impaired UE functional use   OT Treatment/Interventions: Self-care/ADL training;DME and/or AE instruction;Patient/family education;Balance training;Therapeutic activities;Cognitive remediation/compensation    OT Goals(Current goals can be found in the care plan section) Acute Rehab OT Goals Patient Stated Goal: to go to rehab  OT Goal Formulation: With patient Time For Goal Achievement: 02/15/15 Potential to Achieve Goals: Good ADL Goals Pt Will Perform Grooming: with supervision;sitting Pt Will Perform Upper Body Bathing: with mod assist;sitting (adhering to no shoulder movement) Pt Will Perform Upper Body Dressing: with mod assist;sitting (adhering to no shoulder movement) Pt Will Transfer to Toilet: ambulating;bedside commode;with mod assist Additional ADL Goal #1: Pt will use environmental cues for orientation to  time. Additional ADL Goal #2: Pt will perform bed mobility with moderate assistance in preparation for ADL at EOB.  OT Frequency: Min 2X/week   Barriers to D/C:            Co-evaluation              End of Session    Activity Tolerance: Patient limited by pain Patient left: in bed;with call bell/phone within reach;with bed alarm set   Time: 1346-1410 OT Time Calculation (min): 24 min Charges:  OT General Charges $OT Visit: 1 Procedure OT Evaluation $Initial OT Evaluation Tier I: 1 Procedure G-Codes:    Malka So 02/08/2015, 3:04 PM  [phone]

## 2015-02-08 NOTE — Care Management Important Message (Signed)
Important Message  Patient Details  Name: [name] MRN: [id number] Date of Birth: 16-Jul-1940   Medicare Important Message Given:  Yes-second notification given    Delorse Lek 02/08/2015, 8:46 AM

## 2015-02-08 NOTE — Progress Notes (Signed)
PT Cancellation Note  Patient Details Name: [name] MRN: [id number] DOB: [birth year]   Cancelled Treatment:    Reason Eval/Treat Not Completed: Other (comment) (Pt w/ bed rest orders.)  RN to call MD to verify if ok to ambulate.  PT will follow acutely.    Joslyn Hy PT, DPT [phone] Pager: [phone] 02/08/2015, 10:51 AM

## 2015-02-08 NOTE — Op Note (Signed)
[name], [name] NO.:  [id number]  MEDICAL RECORD NO.:  [id number]  LOCATION:  5N24C                        FACILITY:  [facility]  PHYSICIAN:  Arrin Ishler C. Lorin Mercy, M.D.    DATE OF BIRTH:  [birth year]  DATE OF PROCEDURE:  02/07/2015 DATE OF DISCHARGE:                              OPERATIVE REPORT   PREOPERATIVE DIAGNOSES:  Fall with multiple extremity trauma.  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  Left three-part proximal humerus fracture.  POSTOPERATIVE DIAGNOSES:  Fall with multiple extremity trauma.  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  Left three-part proximal humerus fracture.  PROCEDURE: 1. Left total hip arthroplasty with direct anterior approach. 2. Open reduction and internal fixation, left proximal humerus.  SURGEON:  Rodell Perna MD.  ASSISTANT:  Benjiman Core, PA-C, medically necessary and present for the entire procedure.  ANESTHESIA:  General.  TRANSFUSION:  Two units packed cells for starting hemoglobin of 9.7.  DRAINS:  None.  IMPLANTS:  DePuy #11 Corail stem, +5 neck, 48-mm Gription cup without screws, polyethylene liner metal ball +5 neck.  Biomet proximal humerus plate.  Cannulated screws x3 for greater tuberosity fragment, 4.0 Biomet cannulated screws.  DESCRIPTION OF PROCEDURE:  After induction of general anesthesia, the patient was placed on the [facility] table, careful padding and positioning of the arm and the proximal humerus was left in a sling.  Abdominal panniculus was taped across.  Two 10 x 15 drapes were placed.  Standard prepping with DuraPrep, usual split sheets, drapes, large shower curtain, Betadine, Steri-Drape was applied half sheet on the opposite side and at the top.  Time-out procedure was completed.  C-arm had been brought in to make sure there was good visualization of the fracture as well as both hips particularly the lesser trochanter with hips externally rotated at 30 degrees for  measurement of leg lengths after implant.  Direct anterior approach was made.  The rubber skin protector was inserted with the typical difficulty.  Fascia was nicked opened, grasped with Allis clamp, and then medial dissection over the top of the muscle.  Though a cobra was placed medially, capsule was opened, a transverse vein was coagulated with its branches and suture was applied additionally.  Capsule was excised and Cobra was placed medially over the neck, lateral Cobra was placed over the top of the neck, and then the rest of the capsule was removed.  Neck was cut with an oscillating saw after C-arm was brought in to confirm appropriate neck cut.  Neck cut was 13 mm above the lesser trochanter.  Lateral aspect of the cut was finished with osteotome to protect the greater trochanter.  The head was removed with corkscrew, labrum was excised, ligamentum was excised. Sequential reaming the patient has small head measuring 44-45 mm. Sequential reaming up to 47 for a 48 Gription cup in a hole with excellent fit and it was impacted under C-arm to confirm proper position.  Next, the hydraulic hook was placed.  Leg was taken down and under and then hydraulic hook was used to pull the femur out.  Mueller was placed on the lesser trochanter.  Posterior capsule was excised. Lateralization with a  cookie cutter, curette, rongeur, large trochanteric Hohmann was used and then sequential cookie cutter, chili pepper, and then progressing up to a #10.  One this was looked through C- arm, looked like we could start a little bit more lateral since the prosthesis was slightly in varus.  Large prosthesis was selected, 11 broach which gave an excellent tight fit.  We were able to get it little bit further lateral on the entry way using curettes again, rongeur, cookie cutter again, and then progressing up to the #11 broach.  A +0 neck was little short, +5 restored symmetrical leg length.  Metal-on- poly was  used.  A  centralizer had been placed in the midportion of the Gription cup.  Poly with no wall had been inserted, tight checked, and the little nipples on the poly lined up and it was secured tested with a Valora Corporal.  Former prosthesis was inserted, it was stable with external rotation past 90, stable with 45 degrees extension, external rotation at 90-degree Shuck.  The permanent stem had been inserted, trunnion cleaned, and the +5 ball popped on.  Final spot pictures were taken AP and frog leg lateral by externally rotating the hip.  Leg lengths were equal measurement on fluoroscopy.  The patient was then transferred to the [facility] bed to be placed in a beach chair position with the proximal humerus.  Prepping with DuraPrep was performed.  Split sheets, drapes, impervious stockinette, Coban, and time-out procedure repeated. Betadine, Steri-Drape, no repeat antibiotic was indicated. Deltopectoral incision was made.  Vein was taken laterally with the arm. Hohmann retractor was placed.  Fracture was noted, it was reduced, held with K-wires.  A plate was selected Biomet with the greater trochanteric screws.  Bone was extremely soft.  The greater trochanter was a separate fragment posteriorly and was pushed, held with K-wire but the fragment was slightly more posterior starting posterior to the supraspinatus attachment.  The plate was pinned through the center hole with a K-wire exactly in good position.  Distally, screws were placed bicortically, tightened down securely, and then pegs were filled.  The posterior trochanter was still wobbly and 3 cannulated screws were used using a stab incision, poke-through technique, and then  __________ screws in. Greater trochanter had some stability not ideal but the fragment was held securely.  Head was rotated under fluoroscopy to make sure there was no penetration of the pegs through the joint.  Irrigation with saline solution.  Deltopectoral interval was  allowed to fall back into place.  Subcutaneous tissue reapproximated with 2-0 Vicryl, and then skin staple closure.  Incision of the hip had been closed closing the interval with V-Loc suture deep in the fascia, 2-0 in the subcutaneous tissue, skin staple closure in the skin and postop dressing prior to transferring to the [place] bed for the shoulder fixation.  The patient tolerated the procedure well.  Two units of packed cells were given.     Surafel Hilleary C. Lorin Mercy, M.D.     MCY/MEDQ  D:  02/07/2015  T:  02/08/2015  Job:  [id number]

## 2015-02-08 NOTE — Progress Notes (Signed)
Orthopedic Tech Progress Note Patient Details:  [name] 05/23/1941 [id number]  Ortho Devices Type of Ortho Device: Sling immobilizer Ortho Device/Splint Location: lue Ortho Device/Splint Interventions: Application   Joey Lierman 02/08/2015, 8:51 AM

## 2015-02-09 ENCOUNTER — Encounter (HOSPITAL_COMMUNITY): Payer: Self-pay | Admitting: *Deleted

## 2015-02-09 ENCOUNTER — Inpatient Hospital Stay (HOSPITAL_COMMUNITY): Payer: Medicare PPO

## 2015-02-09 LAB — BASIC METABOLIC PANEL
Anion gap: 9 (ref 5–15)
BUN: 26 mg/dL — ABNORMAL HIGH (ref 6–20)
CALCIUM: 7.8 mg/dL — AB (ref 8.9–10.3)
CHLORIDE: 96 mmol/L — AB (ref 101–111)
CO2: 25 mmol/L (ref 22–32)
CREATININE: 1.57 mg/dL — AB (ref 0.44–1.00)
GFR calc Af Amer: 36 mL/min — ABNORMAL LOW (ref >60)
GFR calc non Af Amer: 31 mL/min — ABNORMAL LOW (ref >60)
GLUCOSE: 233 mg/dL — AB (ref 65–99)
Potassium: 5.4 mmol/L — ABNORMAL HIGH (ref 3.5–5.1)
Sodium: 130 mmol/L — ABNORMAL LOW (ref 135–145)

## 2015-02-09 LAB — URINE CULTURE

## 2015-02-09 LAB — CBC
HEMATOCRIT: 27.2 % — AB (ref 36.0–46.0)
HEMOGLOBIN: 8.8 g/dL — AB (ref 12.0–15.0)
MCH: 29.2 pg (ref 26.0–34.0)
MCHC: 32.4 g/dL (ref 30.0–36.0)
MCV: 90.4 fL (ref 78.0–100.0)
Platelets: 235 10*3/uL (ref 150–400)
RBC: 3.01 MIL/uL — ABNORMAL LOW (ref 3.87–5.11)
RDW: 16.3 % — AB (ref 11.5–15.5)
WBC: 9.7 10*3/uL (ref 4.0–10.5)

## 2015-02-09 LAB — GLUCOSE, CAPILLARY
GLUCOSE-CAPILLARY: 205 mg/dL — AB (ref 65–99)
GLUCOSE-CAPILLARY: 157 mg/dL — AB (ref 65–99)
Glucose-Capillary: 265 mg/dL — ABNORMAL HIGH (ref 65–99)
Glucose-Capillary: 279 mg/dL — ABNORMAL HIGH (ref 65–99)
Glucose-Capillary: 236 mg/dL — ABNORMAL HIGH (ref 65–99)

## 2015-02-09 MED ORDER — LEVOFLOXACIN IN D5W 750 MG/150ML IV SOLN: 750.0000 mg | INTRAVENOUS | Status: DC

## 2015-02-09 MED ORDER — SODIUM POLYSTYRENE SULFONATE 15 GM/60ML PO SUSP
30.0000 g | Freq: Once | ORAL | Status: AC
  Administered 2015-02-09: 30 g via ORAL
  Filled 2015-02-09: qty 120

## 2015-02-09 MED ORDER — DEXTROSE 5 % IV SOLN
1.0000 g | INTRAVENOUS | Status: DC
  Administered 2015-02-09 – 2015-02-10 (×2): 1 g via INTRAVENOUS
  Filled 2015-02-09 (×2): qty 10

## 2015-02-09 MED ORDER — INSULIN ASPART 100 UNIT/ML ~~LOC~~ SOLN
0.0000 [IU] | Freq: Three times a day (TID) | SUBCUTANEOUS | Status: DC
  Administered 2015-02-09: 5 [IU] via SUBCUTANEOUS
  Administered 2015-02-09: 8 [IU] via SUBCUTANEOUS
  Administered 2015-02-09: 2 [IU] via SUBCUTANEOUS

## 2015-02-09 NOTE — Clinical Social Work Note (Signed)
Clinical Social Work Assessment  Patient Details  Name: [name] MRN: [id number] Date of Birth: [birth year]  Date of referral:  02/09/15               Reason for consult:  Facility Placement, Discharge Planning                Permission sought to share information with:  Facility Sport and exercise psychologist, Family Supports Permission granted to share information::  Yes, Verbal Permission Granted  Name::     Shirlean Schlein  Agency::  [facility] (preference: [facility])  Relationship::  Friend  Contact Information:  [phone]  Housing/Transportation Living arrangements for the past 2 months:  Mobile Home Source of Information:  Patient, Friend/Neighbor Patient Interpreter Needed:  None Criminal Activity/Legal Involvement Pertinent to Current Situation/Hospitalization:  No - Comment as needed Significant Relationships:  Friend Lives with:  Friends Do you feel safe going back to the place where you live?  No (High fall risk.) Need for family participation in patient care:  Yes (Comment) (Patient's friend active in patient's care.)  Care giving concerns:  Patient nor patient's friend expressed any concern at this time.   Social Worker assessment / plan:  CSW received referral for possible SNF placement at time of discharge. CSW met with patient and patient's friend at bedside. Per patient's friend, patient and patient's friend traveling back to [place] (hometown) when patient's incident occurred. Patient's friend states family/friends would prefer SNF placement at [facility] due to proximity of local friends and where patient's motor home vehicle is parked. CSW to continue to follow and assist with discharge planning needs.  Employment status:  Retired Glass blower/designer) PT Recommendations:  [place] / Referral to community resources:  [place]  Patient/Family's Response to  care:  Patient and patient's friend understanding and agreeable to CSW plan of care.  Patient/Family's Understanding of and Emotional Response to Diagnosis, Current Treatment, and Prognosis:  Patient and patient's friend understanding and agreeable to CSW plan of care.  Emotional Assessment Appearance:  Appears stated age Attitude/Demeanor/Rapport:  Lethargic Affect (typically observed):  Other (Patient resting during assessment. CSW spoke with [name] at bedside.) Orientation:  Oriented to Self, Oriented to Place, Oriented to  Time, Oriented to Situation Alcohol / Substance use:  Not Applicable Psych involvement (Current and /or in the community):  No (Comment) (Not appropriate on this admission.)  Discharge Needs  Concerns to be addressed:  No discharge needs identified Readmission within the last 30 days:  No Current discharge risk:  None Barriers to Discharge:  No Barriers Identified   Caroline Sauger, LCSW 02/09/2015, 12:21 PM [phone]

## 2015-02-09 NOTE — Progress Notes (Signed)
Physical Therapy Treatment Patient Details Name: [name] MRN: [id number] DOB: August 06, 1940 Today's Date: 02/09/2015    History of Present Illness Pt is a 74 y/o F who was traveling from [place] to [place] in a motor home w/ her friend.  She had a fall while visiting friends in [place] w/ resultant Lt hip and Lt humerus fxs.  Now s/p Lt THA and Lt humerus ORIF.  Pt's PMH includes DM, arthritis, HTN, gout.    PT Comments    Patient continues to require +2 assist for all aspects of mobility. Poor effort at this time due to pain and fatigue. Continue to recommend SNF for ongoing Physical Therapy.     Follow Up Recommendations  SNF;Supervision/Assistance - 24 hour;Supervision for mobility/OOB     Equipment Recommendations  Other (comment) (TBD with progress )    Recommendations for Other Services       Precautions / Restrictions Precautions Precautions: Fall Precaution Comments: Per note, hana table use, likely direct anterior THA.  Strict no mobility of Lt UE. Required Braces or Orthoses: Sling (at all times) Restrictions Weight Bearing Restrictions: Yes LUE Weight Bearing: Non weight bearing LLE Weight Bearing: Weight bearing as tolerated    Mobility  Bed Mobility Overal bed mobility: Needs Assistance;+2 for physical assistance Bed Mobility: Rolling Rolling: Total assist   Supine to sit: Total assist;+2 for physical assistance;HOB elevated     General bed mobility comments: Total +2 assist supporting trunk posteriorly and using bed pad to sit up from supine.  HOB elevated and pt w/ heavy use of bed rail.  Max cues for positioning when sitting EOB and attempting to gain upright posture  Transfers Overall transfer level: Needs assistance Equipment used: 2 person hand held assist       Squat pivot transfers: +2 physical assistance;Total assist     General transfer comment: +2 total assist blocking Bil knees and using bed pad to assist pt to stand and pivot to  recliner chair.  Cues for hand placement, pt w/ minimal help an shuffles her Rt foot minimally to pivot. Cues and A for all aspects of transfer  Ambulation/Gait                 Stairs            Wheelchair Mobility    Modified Rankin (Stroke Patients Only)       Balance     Sitting balance-Leahy Scale: Poor Sitting balance - Comments: More upright and midline this session. Required R UE for support on rails     Standing balance-Leahy Scale: Zero                      Cognition Arousal/Alertness: Awake/alert Behavior During Therapy: WFL for tasks assessed/performed   Area of Impairment: Orientation;Attention;Following commands Orientation Level: Disoriented to;Time Current Attention Level: Sustained (Continues to lose train of thought)   Following Commands: Follows one step commands with increased time            Exercises      General Comments        Pertinent Vitals/Pain Pain Score: 9  Pain Location: LLE  Pain Descriptors / Indicators: Sore;Sharp Pain Intervention(s): Monitored during session;Premedicated before session;Repositioned    Home Living                      Prior Function            PT Goals (current goals  can now be found in the care plan section) Progress towards PT goals: Progressing toward goals    Frequency  Min 5X/week    PT Plan Current plan remains appropriate    Co-evaluation             End of Session Equipment Utilized During Treatment: Gait belt Activity Tolerance: Patient limited by fatigue;Patient limited by pain Patient left: in chair;with call bell/phone within reach     Time: 0858-0925 PT Time Calculation (min) (ACUTE ONLY): 27 min  Charges:  $Therapeutic Activity: 23-37 mins                    G Codes:      Jacqualyn Posey 02/09/2015, 9:48 AM  02/09/2015 Selso Mannor, Tonia Brooms PTA

## 2015-02-09 NOTE — Clinical Social Work Placement (Signed)
   CLINICAL SOCIAL WORK PLACEMENT  NOTE  Date:  02/09/2015  Patient Details  Name: [name] MRN: [id number] Date of Birth: 1940/09/16  Clinical Social Work is seeking post-discharge placement for this patient at the [place] level of care (*CSW will initial, date and re-position this form in  chart as items are completed):  Yes   Patient/family provided with [facility] Work Department's list of facilities offering this level of care within the geographic area requested by the patient (or if unable, by the patient's family).  Yes   Patient/family informed of their freedom to choose among providers that offer the needed level of care, that participate in Medicare, Medicaid or managed care program needed by the patient, have an available bed and are willing to accept the patient.  Yes   Patient/family informed of [facility]'s ownership interest in [facility] and [facility], as well as of the fact that they are under no obligation to receive care at these facilities.  PASRR submitted to EDS on 02/09/15     PASRR number received on       Existing PASRR number confirmed on  (n/a)     FL2 transmitted to all facilities in geographic area requested by pt/family on 02/09/15     FL2 transmitted to all facilities within larger geographic area on  (n/a)     Patient informed that his/her managed care company has contracts with or will negotiate with certain facilities, including the following:   (yes, [facility])         Patient/family informed of bed offers received.  Patient chooses bed at       Physician recommends and patient chooses bed at      Patient to be transferred to   on  .  Patient to be transferred to facility by       Patient family notified on   of transfer.  Name of family member notified:        PHYSICIAN Please sign FL2     Additional Comment:    _______________________________________________ Caroline Sauger, LCSW 02/09/2015, 12:25 PM

## 2015-02-09 NOTE — Progress Notes (Signed)
[facility]                                                                              Patient Demographics  [name], is a 74 y.o. female, DOB - 1940-08-19, [id number]  Admit date - 02/05/2015   Admitting Physician Debbe Odea, MD  Outpatient Primary MD for the patient is No primary care provider on file.  LOS - 4   Chief Complaint  Patient presents with  . Shoulder Injury  . Hip Pain       Brief HPI   [name] is a 74 y.o. female with HTN and DM who is visiting from [place]. While doing the laundry today, she lost her footing and fell onto her left sided and subsequently had pain in her left hip and shoulder. No loss of consciousness. She was brought to the ER and is found to have a left humeral and left hip fracture. Pain is currently moderate but worse when moved. No other complaints.    Assessment & Plan    Principal Problem:   Left Hip fracture, left humeral fracture status post mechanical fall : postop day # 2 - Orthopedics was consulted, patient underwent total left hip arthroplasty, open ORIF proximal humerus fracture left - Hemoglobin stable 8.8. - DVT prophylaxis per orthopedics - PT OT evaluation-> SNF   Active Problems:   DM (diabetes mellitus): type 2, uncontrolled with hyperglycemia  - Continue sliding scale insulin, increased to moderate + meal coverage - Diet changed to carb modified     HTN (hypertension) -Currently stable , continue Cardizem    Gout - Continue uloric  Low-grade temp with tachycardia, Ecoli UTI - obtain UA and culture, blood cultures, obtain CXR - started on IV rocephin  Code Status:Full code   Family Communication: Discussed in detail with the patient, all imaging results, lab results explained to the patient . Called patient's son, [name] [phone], in [place], on phone , left detailed message yesterday   Disposition Plan: Will likely need short-term rehabilitation, patient's son  and patient agreeable with the plan   Time Spent in minutes   60minutes  Procedures  None   Consults   ortho  DVT Prophylaxis heparin   Medications  Scheduled Meds: . aspirin EC  325 mg Oral Q breakfast  . cefTRIAXone (ROCEPHIN)  IV  1 g Intravenous Q24H  . diltiazem  120 mg Oral Daily  . docusate sodium  100 mg Oral QHS  . febuxostat  40 mg Oral Daily  . gabapentin  300 mg Oral QHS  . glipiZIDE  10 mg Oral BID  . heparin  5,000 Units Subcutaneous 3 times per day  . insulin aspart  0-15 Units Subcutaneous TID WC  . insulin aspart  0-5 Units Subcutaneous QHS  . insulin aspart  4 Units Subcutaneous TID WC  . multivitamin with minerals  1 tablet Oral Daily   Continuous Infusions: . sodium chloride 1,000 mL (02/09/15 0818)   PRN Meds:.acetaminophen **OR** acetaminophen, alum & mag hydroxide-simeth, HYDROcodone-acetaminophen, menthol-cetylpyridinium **OR** phenol, methocarbamol **OR** methocarbamol (ROBAXIN)  IV, metoCLOPramide **OR** metoCLOPramide (REGLAN) injection, morphine injection, ondansetron (ZOFRAN) IV,  ondansetron **OR** ondansetron (ZOFRAN) IV, oxyCODONE, oxyCODONE-acetaminophen   Antibiotics   Anti-infectives    Start     Dose/Rate Route Frequency Ordered Stop   02/09/15 0830  cefTRIAXone (ROCEPHIN) 1 g in dextrose 5 % 50 mL IVPB     1 g 100 mL/hr over 30 Minutes Intravenous Every 24 hours 02/09/15 0806     02/09/15 0815  levofloxacin (LEVAQUIN) IVPB 750 mg  Status:  Discontinued     750 mg 100 mL/hr over 90 Minutes Intravenous Every 24 hours 02/09/15 0804 02/09/15 0806   02/07/15 2030  ceFAZolin (ANCEF) IVPB 1 g/50 mL premix     1 g 100 mL/hr over 30 Minutes Intravenous 3 times per day 02/07/15 1831 02/08/15 [id number]        Subjective:   Shawnn Rottman was seen and examined today. Still spiking temp, otherwise denies any chest pain, coughing. Patient denies dizziness, chest pain, shortness of breath, abdominal pain, N/V/D/C, new weakness, numbess, tingling.    Objective:   Blood pressure 132/52, pulse 102, temperature 100.2 F (37.9 C), temperature source Oral, resp. rate 16, height 5\' 4"  (1.626 m), weight 81.647 kg (180 lb), SpO2 95 %.  Wt Readings from Last 3 Encounters:  02/05/15 81.647 kg (180 lb)     Intake/Output Summary (Last 24 hours) at 02/09/15 1224 Last data filed at 02/09/15 0030  Gross per 24 hour  Intake    480 ml  Output   2450 ml  Net  -1970 ml    Exam  General: Alert and oriented x 3, NAD  HEENT:  PERRLA, EOMI  Neck: Supple, no JVD, no masses  CVS: S1 S2 clear, RRR  Respiratory: CTAB  Abdomen: Soft, NT, ND, + bowel sounds  Ext: no c/c/e, dressings intact   NeuroNo new deficits  skin: No rashes  Psych: Normal affect and demeanor, alert and oriented x3    Data Review   Micro Results Recent Results (from the past 240 hour(s))  MRSA PCR Screening     Status: None   Collection Time: 02/06/15 12:50 AM  Result Value Ref Range Status   MRSA by PCR NEGATIVE NEGATIVE Final    Comment:        The GeneXpert MRSA Assay (FDA approved for NASAL specimens only), is one component of a comprehensive MRSA colonization surveillance program. It is not intended to diagnose MRSA infection nor to guide or monitor treatment for MRSA infections.   Urine culture     Status: None (Preliminary result)   Collection Time: 02/07/15 11:45 AM  Result Value Ref Range Status   Specimen Description URINE, CATHETERIZED  Final   Special Requests NONE  Final   Culture >=100,000 COLONIES/mL ESCHERICHIA COLI  Final   Report Status PENDING  Incomplete  Urine culture     Status: None (Preliminary result)   Collection Time: 02/08/15  4:11 PM  Result Value Ref Range Status   Specimen Description URINE, CATHETERIZED  Final   Special Requests NONE  Final   Culture NO GROWTH < 24 HOURS  Final   Report Status PENDING  Incomplete    Radiology Reports Dg Chest Port 1 View  02/09/2015   CLINICAL DATA:  Fever. History of  hypertension, diabetes and gout. Initial encounter.  EXAM: PORTABLE CHEST - 1 VIEW  COMPARISON:  None.  FINDINGS: 0826 hours. The heart size and mediastinal contours are normal. The lungs are clear. There is no pleural effusion or pneumothorax. Postsurgical changes are noted within the proximal left humerus.  No acute osseous findings apparent.  IMPRESSION: No active cardiopulmonary process.   Electronically Signed   By: Richardean Sale M.D.   On: 02/09/2015 08:41   Dg Shoulder Left  02/07/2015   CLINICAL DATA:  ORIF left humeral neck fracture  EXAM: DG C-ARM 61-120 MIN; LEFT SHOULDER - 2+ VIEW  COMPARISON:  None.  FINDINGS: Plate and screw fixation device noted within the proximal left humerus. Placement of additional proximal humeral screws. No complicating feature.  IMPRESSION: Internal fixation without visible complicating feature.   Electronically Signed   By: Rolm Baptise M.D.   On: 02/07/2015 15:36   Dg Shoulder Left  02/05/2015   CLINICAL DATA:  [place] today.  Pain.  EXAM: LEFT SHOULDER - 2+ VIEW  COMPARISON:  None.  FINDINGS: There is an impacted and comminuted humeral neck fracture, without apparent dislocation. Avulsed greater tuberosity. Soft tissue swelling.  IMPRESSION: Impacted and comminuted humeral neck fracture without apparent dislocation.   Electronically Signed   By: Staci Righter M.D.   On: 02/05/2015 14:09   Dg C-arm 1-60 Min  02/07/2015   CLINICAL DATA:  ORIF left humeral neck fracture  EXAM: DG C-ARM 61-120 MIN; LEFT SHOULDER - 2+ VIEW  COMPARISON:  None.  FINDINGS: Plate and screw fixation device noted within the proximal left humerus. Placement of additional proximal humeral screws. No complicating feature.  IMPRESSION: Internal fixation without visible complicating feature.   Electronically Signed   By: Rolm Baptise M.D.   On: 02/07/2015 15:36   Dg Hip Operative Unilat With Pelvis Left  02/07/2015   CLINICAL DATA:  Status post total hip arthroplasty  EXAM: OPERATIVE Left HIP  (WITH PELVIS IF PERFORMED) 2 VIEWS  TECHNIQUE: Fluoroscopic spot image(s) were submitted for interpretation post-operatively.  FLUOROSCOPY TIME:  0 minutes 27 seconds.  Six submitted images  COMPARISON:  None.  FINDINGS: Frontal and lateral view showed total hip replacement with prosthetic components appearing well-seated. No fracture or dislocation. No bony destruction.  IMPRESSION: Prosthetic components appear well seated. No acute fracture or dislocation.   Electronically Signed   By: Lowella Grip III M.D.   On: 02/07/2015 15:30   Dg Hip Unilat With Pelvis 2-3 Views Left  02/05/2015   CLINICAL DATA:  Fall with left hip pain.  Initial encounter.  EXAM: DG HIP (WITH OR WITHOUT PELVIS) 2-3V LEFT  COMPARISON:  None.  FINDINGS: Subcapital left femoral neck fracture with mild impaction. 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  No evidence of pelvic ring fracture or diastasis.  Bulky spondylosis seen in the lower lumbar spine.  IMPRESSION: Mildly impacted subcapital left femoral neck fracture.   Electronically Signed   By: Monte Fantasia M.D.   On: 02/05/2015 14:08    CBC  Recent Labs Lab 02/05/15 1524 02/07/15 0505 02/07/15 1247 02/08/15 0524 02/09/15 0443  WBC 12.2* 9.1  --  10.6* 9.7  HGB 10.0* 9.7* 7.8* 9.5* 8.8*  HCT 30.5* 29.5* 23.0* 28.1* 27.2*  PLT 268 252  --  241 235  MCV 90.2 89.9  --  88.6 90.4  MCH 29.6 29.6  --  30.0 29.2  MCHC 32.8 32.9  --  33.8 32.4  RDW 15.9* 16.1*  --  16.2* 16.3*  LYMPHSABS 1.0  --   --   --   --   MONOABS 0.5  --   --   --   --   EOSABS 0.1  --   --   --   --   BASOSABS  0.0  --   --   --   --     Chemistries   Recent Labs Lab 02/05/15 1524 02/07/15 0505 02/07/15 1247 02/08/15 0524 02/09/15 0443  NA 139 133* 133* 131* 130*  K 4.7 4.6 4.9 5.2* 5.4*  CL 105 101  --  100* 96*  CO2 25 24  --  23 25  GLUCOSE 254* 230* 219* 149* 233*  BUN 25* 16  --  19 26*  CREATININE 1.37* 1.28*  --  1.41* 1.57*  CALCIUM 9.0 8.6*  --  7.7*  7.8*   ------------------------------------------------------------------------------------------------------------------ estimated creatinine clearance is 32 mL/min (by C-G formula based on Cr of 1.57). ------------------------------------------------------------------------------------------------------------------ No results for input(s): HGBA1C in the last 72 hours. ------------------------------------------------------------------------------------------------------------------ No results for input(s): CHOL, HDL, LDLCALC, TRIG, CHOLHDL, LDLDIRECT in the last 72 hours. ------------------------------------------------------------------------------------------------------------------ No results for input(s): TSH, T4TOTAL, T3FREE, THYROIDAB in the last 72 hours.  Invalid input(s): FREET3 ------------------------------------------------------------------------------------------------------------------ No results for input(s): VITAMINB12, FOLATE, FERRITIN, TIBC, IRON, RETICCTPCT in the last 72 hours.  Coagulation profile  Recent Labs Lab 02/05/15 1524  INR 1.13    No results for input(s): DDIMER in the last 72 hours.  Cardiac Enzymes No results for input(s): CKMB, TROPONINI, MYOGLOBIN in the last 168 hours.  Invalid input(s): CK ------------------------------------------------------------------------------------------------------------------ Invalid input(s): [place]  02/08/15 1109 02/08/15 1616 02/08/15 2137 02/09/15 0737 02/09/15 0835 02/09/15 1132  GLUCAP 172* 355* 280* 205* 265* 56*     RAI,RIPUDEEP M.D. [facility] 02/09/2015, 12:24 PM  Pager: [phone] Between 7am to 7pm - call Pager - [phone]  After 7pm go to [email or web address] - password [email or web address]  Call night coverage person covering after 7pm

## 2015-02-09 NOTE — Progress Notes (Signed)
Subjective: 2 Days Post-Op Procedure(s) (LRB): TOTAL HIP ARTHROPLASTY ANTERIOR APPROACH  (Left) OPEN REDUCTION INTERNAL FIXATION (ORIF) PROXIMAL HUMERUS FRACTURE (Left) Patient reports pain as 7 on 0-10 scale.  Slightly more alert.   Objective: Vital signs in last 24 hours: Temp:  [98.4 F (36.9 C)-102.3 F (39.1 C)] 100.2 F (37.9 C) (09/14 0636) Pulse Rate:  [90-104] 102 (09/14 0636) Resp:  [16-18] 16 (09/14 0636) BP: (114-132)/(43-56) 132/52 mmHg (09/14 0636) SpO2:  [95 %-96 %] 95 % (09/14 0636)  Intake/Output from previous day: 09/13 0701 - 09/14 0700 In: 720 [P.O.:720] Out: 2450 [Urine:2450] Intake/Output this shift:     Recent Labs  02/07/15 0505 02/07/15 1247 02/08/15 0524 02/09/15 0443  HGB 9.7* 7.8* 9.5* 8.8*    Recent Labs  02/08/15 0524 02/09/15 0443  WBC 10.6* 9.7  RBC 3.17* 3.01*  HCT 28.1* 27.2*  PLT 241 235    Recent Labs  02/08/15 0524 02/09/15 0443  NA 131* 130*  K 5.2* 5.4*  CL 100* 96*  CO2 23 25  BUN 19 26*  CREATININE 1.41* 1.57*  GLUCOSE 149* 233*  CALCIUM 7.7* 7.8*   No results for input(s): LABPT, INR in the last 72 hours.  Neurologically intact  Assessment/Plan: 2 Days Post-Op Procedure(s) (LRB): TOTAL HIP ARTHROPLASTY ANTERIOR APPROACH  (Left) OPEN REDUCTION INTERNAL FIXATION (ORIF) PROXIMAL HUMERUS FRACTURE (Left) Up with therapy   Not been able to get OOB so far.   [name] C 02/09/2015, 7:40 AM

## 2015-02-10 ENCOUNTER — Inpatient Hospital Stay
Admission: RE | Admit: 2015-02-10 | Discharge: 2015-03-03 | Disposition: A | Discharge status: Home / Self Care | Payer: Medicare PPO | Source: Ambulatory Visit | Attending: Internal Medicine | Admitting: Internal Medicine

## 2015-02-10 DIAGNOSIS — W19XXXD Unspecified fall, subsequent encounter: Secondary | ICD-10-CM

## 2015-02-10 LAB — CBC
HEMATOCRIT: 26 % — AB (ref 36.0–46.0)
Hemoglobin: 8 g/dL — ABNORMAL LOW (ref 12.0–15.0)
MCH: 28.5 pg (ref 26.0–34.0)
MCHC: 30.8 g/dL (ref 30.0–36.0)
MCV: 92.5 fL (ref 78.0–100.0)
PLATELETS: 248 10*3/uL (ref 150–400)
RBC: 2.81 MIL/uL — ABNORMAL LOW (ref 3.87–5.11)
RDW: 16.2 % — AB (ref 11.5–15.5)
WBC: 9.5 10*3/uL (ref 4.0–10.5)

## 2015-02-10 LAB — GLUCOSE, CAPILLARY
GLUCOSE-CAPILLARY: 48 mg/dL — AB (ref 65–99)
GLUCOSE-CAPILLARY: 56 mg/dL — AB (ref 65–99)
GLUCOSE-CAPILLARY: 96 mg/dL (ref 65–99)
Glucose-Capillary: 136 mg/dL — ABNORMAL HIGH (ref 65–99)
Glucose-Capillary: 214 mg/dL — ABNORMAL HIGH (ref 65–99)
Glucose-Capillary: 150 mg/dL — ABNORMAL HIGH (ref 65–99)

## 2015-02-10 LAB — URINE CULTURE

## 2015-02-10 LAB — BASIC METABOLIC PANEL
ANION GAP: 8 (ref 5–15)
BUN: 24 mg/dL — AB (ref 6–20)
CALCIUM: 7.5 mg/dL — AB (ref 8.9–10.3)
CO2: 26 mmol/L (ref 22–32)
CREATININE: 1.29 mg/dL — AB (ref 0.44–1.00)
Chloride: 101 mmol/L (ref 101–111)
GFR calc Af Amer: 46 mL/min — ABNORMAL LOW (ref >60)
GFR, EST NON AFRICAN AMERICAN: 39 mL/min — AB (ref >60)
GLUCOSE: 55 mg/dL — AB (ref 65–99)
Potassium: 4 mmol/L (ref 3.5–5.1)
Sodium: 135 mmol/L (ref 135–145)

## 2015-02-10 MED ORDER — ASPIRIN 325 MG PO TBEC: 325.0000 mg | DELAYED_RELEASE_TABLET | Freq: Every day | ORAL | Status: DC

## 2015-02-10 MED ORDER — DOCUSATE SODIUM 100 MG PO CAPS: 100.0000 mg | ORAL_CAPSULE | Freq: Every day | ORAL | Status: DC

## 2015-02-10 MED ORDER — OXYCODONE-ACETAMINOPHEN 5-325 MG PO TABS: 1.0000 | ORAL_TABLET | ORAL | Status: DC | PRN

## 2015-02-10 MED ORDER — CEPHALEXIN 500 MG PO CAPS: 500.0000 mg | ORAL_CAPSULE | Freq: Three times a day (TID) | ORAL | Status: DC

## 2015-02-10 MED ORDER — INSULIN ASPART 100 UNIT/ML ~~LOC~~ SOLN
0.0000 [IU] | Freq: Three times a day (TID) | SUBCUTANEOUS | Status: DC
  Administered 2015-02-10: 3 [IU] via SUBCUTANEOUS

## 2015-02-10 MED ORDER — METHOCARBAMOL 500 MG PO TABS: 500.0000 mg | ORAL_TABLET | Freq: Four times a day (QID) | ORAL | Status: DC | PRN

## 2015-02-10 NOTE — Discharge Summary (Signed)
Physician Discharge Summary   Patient ID: COLINE BUREK MRN: [id number] DOB/AGE: Mar 07, 1941 74 y.o.  Admit date: 02/05/2015 Discharge date: 02/10/2015  Primary Care Physician:  No primary care provider on file.  Discharge Diagnoses:    . left humeral fracture status post ORIF   . 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 Lorin Mercy   Recommendations for Outpatient Follow-up:  Continue physical therapy  Patient may need sliding scale insulin while at skilled nursing facility  [place] BMET, CBC per protocol   DIET: Carb modified diet    Allergies:  No Known Allergies   Discharge Medications:   Medication List    TAKE these medications        aspirin 325 MG EC tablet  Take 1 tablet (325 mg total) by mouth daily.     CALCIUM PO  Take 1 tablet by mouth daily.     CARTIA XT 180 MG 24 hr capsule  Generic drug:  diltiazem  Take 1 tablet by mouth daily.     cephALEXin 500 MG capsule  Commonly known as:  KEFLEX  Take 1 capsule (500 mg total) by mouth 3 (three) times daily. X 3 days     clobetasol 0.05 % external solution  Commonly known as:  TEMOVATE  Apply 1 application topically daily as needed. rash     docusate sodium 100 MG capsule  Commonly known as:  COLACE  Take 1 capsule (100 mg total) by mouth at bedtime.     gabapentin 300 MG capsule  Commonly known as:  NEURONTIN  Take 1 capsule by mouth at bedtime.     glipiZIDE 10 MG tablet  Commonly known as:  GLUCOTROL  Take 1 tablet by mouth 2 (two) times daily.     methocarbamol 500 MG tablet  Commonly known as:  ROBAXIN  Take 1 tablet (500 mg total) by mouth every 6 (six) hours as needed for muscle spasms.     multivitamin with minerals Tabs tablet  Take 1 tablet by mouth daily.     naproxen sodium 220 MG tablet  Commonly known as:  ANAPROX  Take 220 mg by mouth  daily as needed (pain).     oxyCODONE-acetaminophen 5-325 MG per tablet  Commonly known as:  PERCOCET/ROXICET  Take 1 tablet by mouth every 4 (four) hours as needed for moderate pain.     triamcinolone cream 0.1 %  Commonly known as:  KENALOG  Apply 1 application topically daily as needed. rash     ULORIC 40 MG tablet  Generic drug:  febuxostat  Take 1 tablet by mouth daily.         Brief H and P: For complete details please refer to admission H and P, but in brief  EVAJEAN PENISTON is a 74 y.o. female with HTN and DM who is visiting from [place]. While doing the laundry today, she lost her footing and fell onto her left sided and subsequently had pain in her left hip and shoulder. No loss of consciousness. She was brought to the ER and is found to have a left humeral and left hip fracture. Pain is currently moderate but worse when moved. No other complaints.   Hospital Course:  Left Hip fracture, left humeral fracture status post mechanical fall : postop day # 3 - Orthopedics was consulted, patient underwent total left hip arthroplasty, open  ORIF proximal humerus fracture left on 9/12 - Hemoglobin stable 8.0, stable per orthopedics for discharge to skilled nursing facility - PT OT evaluation-> SNF     DM (diabetes mellitus): type 2, uncontrolled with hypo-and hyperglycemia  -Continue carb modified diet, glipizide - Patient may need sliding scale insulin while at the facility   HTN (hypertension) -Currently stable , continue Cardizem   Gout - Continue uloric  Low-grade temp with tachycardia, Ecoli UTI - Urine culture showed Escherichia coli, patient has received 2 days of IV Rocephin, continue oral Keflex to complete course   Day of Discharge BP 132/58 mmHg  Pulse 98  Temp(Src) 98.9 F (37.2 C) (Oral)  Resp 16  Ht 5\' 4"  (1.626 m)  Wt 81.647 kg (180 lb)  BMI 30.88 kg/m2  SpO2 95%  Physical Exam: General: Alert and awake oriented x3 not in any acute  distress. HEENT: anicteric sclera, pupils reactive to light and accommodation CVS: S1-S2 clear no murmur rubs or gallops Chest: clear to auscultation bilaterally, no wheezing rales or rhonchi Abdomen: soft nontender, nondistended, normal bowel sounds Extremities: no cyanosis, clubbing or edema noted bilaterally, dressings intact Neuro: Cranial nerves II-XII intact, no focal neurological deficits   The results of significant diagnostics from this hospitalization (including imaging, microbiology, ancillary and laboratory) are listed below for reference.    LAB RESULTS: Basic Metabolic Panel:  Recent Labs Lab 02/09/15 0443 02/10/15 0524  NA 130* 135  K 5.4* 4.0  CL 96* 101  CO2 25 26  GLUCOSE 233* 55*  BUN 26* 24*  CREATININE 1.57* 1.29*  CALCIUM 7.8* 7.5*   Liver Function Tests: No results for input(s): AST, ALT, ALKPHOS, BILITOT, PROT, ALBUMIN in the last 168 hours. No results for input(s): LIPASE, AMYLASE in the last 168 hours. No results for input(s): AMMONIA in the last 168 hours. CBC:  Recent Labs Lab 02/05/15 1524  02/09/15 0443 02/10/15 0524  WBC 12.2*  < > 9.7 9.5  NEUTROABS 10.6*  --   --   --   HGB 10.0*  < > 8.8* 8.0*  HCT 30.5*  < > 27.2* 26.0*  MCV 90.2  < > 90.4 92.5  PLT 268  < > 235 248  < > = values in this interval not displayed. Cardiac Enzymes: No results for input(s): CKTOTAL, CKMB, CKMBINDEX, TROPONINI in the last 168 hours. BNP: Invalid input(s): POCBNP CBG:  Recent Labs Lab 02/10/15 0717 02/10/15 0757  GLUCAP 96 136*    Significant Diagnostic Studies:  Dg Shoulder Left  02/05/2015   CLINICAL DATA:  [place] today.  Pain.  EXAM: LEFT SHOULDER - 2+ VIEW  COMPARISON:  None.  FINDINGS: There is an impacted and comminuted humeral neck fracture, without apparent dislocation. Avulsed greater tuberosity. Soft tissue swelling.  IMPRESSION: Impacted and comminuted humeral neck fracture without apparent dislocation.   Electronically Signed   By: Staci Righter M.D.   On: 02/05/2015 14:09   Dg Hip Unilat With Pelvis 2-3 Views Left  02/05/2015   CLINICAL DATA:  Fall with left hip pain.  Initial encounter.  EXAM: DG HIP (WITH OR WITHOUT PELVIS) 2-3V LEFT  COMPARISON:  None.  FINDINGS: Subcapital left femoral neck fracture with mild impaction. 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  No evidence of pelvic ring fracture or diastasis.  Bulky spondylosis seen in the lower lumbar spine.  IMPRESSION: Mildly impacted subcapital left femoral neck fracture.   Electronically Signed   By: Monte Fantasia M.D.   On: 02/05/2015 14:08  2D ECHO:   Disposition and Follow-up:     Discharge Instructions    Diet Carb Modified    Complete by:  As directed      Increase activity slowly    Complete by:  As directed             DISPOSITION: Skilled nursing facility   DISCHARGE FOLLOW-UP Follow-up Information    Schedule an appointment as soon as possible for a visit with Marybelle Killings, MD.   Specialty:  Orthopedic Surgery   Why:  needs return office visit 2 weeks postop   Contact information:   [place] [id number] [phone]        Time spent on Discharge: 35 mins   Signed:   RAI,RIPUDEEP M.D. [facility] 02/10/2015, 12:31 PM Pager: [phone]

## 2015-02-10 NOTE — Progress Notes (Signed)
Subjective: Doing ok.  No specific complaints.    Objective: Vital signs in last 24 hours: Temp:  [98.5 F (36.9 C)-99.2 F (37.3 C)] 98.9 F (37.2 C) (09/15 0501) Pulse Rate:  [94-98] 98 (09/15 0501) Resp:  [16] 16 (09/15 0501) BP: (127-132)/(50-64) 132/58 mmHg (09/15 0501) SpO2:  [90 %-98 %] 95 % (09/15 0501)  Intake/Output from previous day: 09/14 0701 - 09/15 0700 In: 3906.7 [P.O.:1510; I.V.:2246.7; IV Piggyback:50] Out: 2151 [Urine:2150; Stool:1] Intake/Output this shift:     Recent Labs  02/07/15 1247 02/08/15 0524 02/09/15 0443 02/10/15 0524  HGB 7.8* 9.5* 8.8* 8.0*    Recent Labs  02/09/15 0443 02/10/15 0524  WBC 9.7 9.5  RBC 3.01* 2.81*  HCT 27.2* 26.0*  PLT 235 248    Recent Labs  02/09/15 0443 02/10/15 0524  NA 130* 135  K 5.4* 4.0  CL 96* 101  CO2 25 26  BUN 26* 24*  CREATININE 1.57* 1.29*  GLUCOSE 233* 55*  CALCIUM 7.8* 7.5*   No results for input(s): LABPT, INR in the last 72 hours.  Exam:  Wounds left shoulder and hip look good.  Staples intact.  No drainage or signs of infection.  Calf nontender.  NVI.    Assessment/Plan: Transfer to snf when medically stable and bed available.  Doing well from our standpoint.    Ryann Leavitt M 02/10/2015, 8:18 AM

## 2015-02-10 NOTE — Progress Notes (Signed)
Physical Therapy Treatment Patient Details Name: [name] MRN: [id number] DOB: [birth year] Today's Date: 02/10/2015    History of Present Illness Pt is a 74 y/o F who was traveling from [place] to [place] in a motor home w/ her friend.  She had a fall while visiting friends in [place] w/ resultant Lt hip and Lt humerus fxs.  Now s/p Lt THA and Lt humerus ORIF.  Pt's PMH includes DM, arthritis, HTN, gout.    PT Comments    Ms. Wachs is not progressing w/ therapy, unsure if this is 2/2 unwillingness or 2/2 pain and lethargy.  She requires total assist +2 for all mobility and repeatedly reports she is "too weak" for any mobility.  Educated pt on importance of mobility and potential consequences.  Attempted to encourage and motivate pt to participate but transfer to chair w/ stedy unsuccessful.  Bed sores noted on sacral area, RN and nurse tech notified of this and that the lift will need to be used to transfer pt to chair to relieve pressure.  Pt will benefit from continued skilled PT services to increase functional independence and safety.   Follow Up Recommendations  SNF;Supervision/Assistance - 24 hour;Supervision for mobility/OOB     Equipment Recommendations  Other (comment) (TBD at next venue of care)    Recommendations for Other Services       Precautions / Restrictions Precautions Precautions: Fall Precaution Comments: Per note, hana table use, likely direct anterior THA.  Strict no mobility of Lt UE. Required Braces or Orthoses: Sling (sling at all times) Restrictions Weight Bearing Restrictions: Yes LUE Weight Bearing: Non weight bearing LLE Weight Bearing: Weight bearing as tolerated    Mobility  Bed Mobility Overal bed mobility: Needs Assistance;+2 for physical assistance Bed Mobility: Supine to Sit;Sit to Supine     Supine to sit: Total assist;+2 for physical assistance;HOB elevated Sit to supine: +2 for physical assistance;+2 for safety/equipment;Total  assist   General bed mobility comments: Total +2 assist supporting trunk posteriorly and using bed pad to sit up from supine.  HOB elevated and pt w/ heavy use of bed rail.  Pt leaning to Rt and reporting that her Rt UE is too weak to hold her up in sitting.  Multiple verbal and hand over hand tacile cues to push up to upright.  Transfers                 General transfer comment: Attempted sit>stand using stedy; however pt not assisting w/ transfer.  Multiple attempts made after providing encouragement, rationale, and motivation.  Pt repeatedly says she is too weak to hold herself up in bed and to stand up.    Ambulation/Gait                 Stairs            Wheelchair Mobility    Modified Rankin (Stroke Patients Only)       Balance Overall balance assessment: Needs assistance Sitting-balance support: Single extremity supported;Feet supported Sitting balance-Leahy Scale: Poor Sitting balance - Comments: Continues to spontaneously begin leaning posteriorly and to the Rt sitting EOB Postural control: Posterior lean;Right lateral lean                          Cognition Arousal/Alertness: Awake/alert Behavior During Therapy: WFL for tasks assessed/performed Overall Cognitive Status: Impaired/Different from baseline Area of Impairment: Attention;Following commands;Safety/judgement   Current Attention Level: Sustained   Following Commands:  Follows one step commands inconsistently;Follows one step commands with increased time Safety/Judgement: Decreased awareness of safety;Decreased awareness of deficits     General Comments: Pt continues to repeat, "hold on a minute" when asked to do something but cannot give a reason why she needs to wait    Exercises Total Joint Exercises Ankle Circles/Pumps: AROM;Both;15 reps;Supine    General Comments General comments (skin integrity, edema, etc.): Pt continues to not asist w/ any mobility despite motivation  and encouragement.  Noted bed sores on her sacrum when sitting EOB and RN and nurse tech notified.  Educated pt on importance of mobility and the consequences of not getting OOB.  RN and nurse tech notified to use lift to assist pt to chair to relieve pressure.      Pertinent Vitals/Pain Pain Assessment: Faces Faces Pain Scale: Hurts whole lot Pain Location: Lt LE Pain Descriptors / Indicators: Moaning;Grimacing;Guarding Pain Intervention(s): Limited activity within patient's tolerance;Monitored during session    Home Living                      Prior Function            PT Goals (current goals can now be found in the care plan section) Acute Rehab PT Goals Patient Stated Goal: to go to rehab and then get back to walking PT Goal Formulation: With patient Time For Goal Achievement: 02/22/15 Potential to Achieve Goals: Fair Progress towards PT goals: Not progressing toward goals - comment (unsure if 2/2 unwillingness or uncontrolled pain & lethargy)    Frequency  Min 5X/week    PT Plan Current plan remains appropriate    Co-evaluation             End of Session   Activity Tolerance: Patient limited by pain;Patient limited by lethargy;Patient limited by fatigue Patient left: with call bell/phone within reach;in bed;with nursing/sitter in room (w/ nurse tech in room)     Time: [id number] PT Time Calculation (min) (ACUTE ONLY): 35 min  Charges:  $Therapeutic Activity: 23-37 mins                    G Codes:      Joslyn Hy PT, DPT [phone] Pager: [phone] 02/10/2015, 2:22 PM

## 2015-02-10 NOTE — Discharge Planning (Signed)
Patient to be discharged to [facility]. Patient's friend, Shirlean Schlein, updated at bedside.  Facility: [facility] RN report number: [id number] Transportation: [place], [place] [phone]) and Surgical [phone])

## 2015-02-10 NOTE — Discharge Instructions (Signed)
INSTRUCTIONS AFTER JOINT REPLACEMENT FOR RIGHT HIP  o Remove items at home which could result in a fall. This includes throw rugs or furniture in walking pathways o ICE to the affected joint every three hours while awake for 30 minutes at a time, for at least the first 3-5 days, and then as needed for pain and swelling.  Continue to use ice for pain and swelling. You may notice swelling that will progress down to the foot and ankle.  This is normal after surgery.  Elevate your leg when you are not up walking on it.   o Continue to use the breathing machine you got in the hospital (incentive spirometer) which will help keep your temperature down.  It is common for your temperature to cycle up and down following surgery, especially at night when you are not up moving around and exerting yourself.  The breathing machine keeps your lungs expanded and your temperature down.   DIET:  As you were doing prior to hospitalization, we recommend a well-balanced diet.  DRESSING / WOUND CARE / SHOWERING  You may change your dressing 3-5 days after surgery.  Then change the dressing every day with sterile gauze.  Please use good hand washing techniques before changing the dressing.  Do not use any lotions or creams on the incision until instructed by your surgeon.  ACTIVITY  o Increase activity slowly as tolerated, but follow the weight bearing instructions below.   o No driving for 6 weeks or until further direction given by your physician.  You cannot drive while taking narcotics.  o No lifting or carrying greater than 10 lbs. until further directed by your surgeon. o Avoid periods of inactivity such as sitting longer than an hour when not asleep. This helps prevent blood clots.  o You may return to work once you are authorized by your doctor.     WEIGHT BEARING   Weight bearing as tolerated with assist device (walker, cane, etc) as directed, use it as long as suggested by your surgeon or therapist,  typically at least 4-6 weeks.   EXERCISES  Results after joint replacement surgery are often greatly improved when you follow the exercise, range of motion and muscle strengthening exercises prescribed by your doctor. Safety measures are also important to protect the joint from further injury. Any time any of these exercises cause you to have increased pain or swelling, decrease what you are doing until you are comfortable again and then slowly increase them. If you have problems or questions, call your caregiver or physical therapist for advice.   Rehabilitation is important following a joint replacement. After just a few days of immobilization, the muscles of the leg can become weakened and shrink (atrophy).  These exercises are designed to build up the tone and strength of the thigh and leg muscles and to improve motion. Often times heat used for twenty to thirty minutes before working out will loosen up your tissues and help with improving the range of motion but do not use heat for the first two weeks following surgery (sometimes heat can increase post-operative swelling).   These exercises can be done on a training (exercise) mat, on the floor, on a table or on a bed. Use whatever works the best and is most comfortable for you.    Use music or television while you are exercising so that the exercises are a pleasant break in your day. This will make your life better with the exercises acting as  a break in your routine that you can look forward to.   Perform all exercises about fifteen times, three times per day or as directed.  You should exercise both the operative leg and the other leg as well.  Exercises include:    Quad Sets - Tighten up the muscle on the front of the thigh (Quad) and hold for 5-10 seconds.    Straight Leg Raises - With your knee straight (if you were given a brace, keep it on), lift the leg to 60 degrees, hold for 3 seconds, and slowly lower the leg.  Perform this exercise  against resistance later as your leg gets stronger.   Leg Slides: Lying on your back, slowly slide your foot toward your buttocks, bending your knee up off the floor (only go as far as is comfortable). Then slowly slide your foot back down until your leg is flat on the floor again.   Angel Wings: Lying on your back spread your legs to the side as far apart as you can without causing discomfort.   Hamstring Strength:  Lying on your back, push your heel against the floor with your leg straight by tightening up the muscles of your buttocks.  Repeat, but this time bend your knee to a comfortable angle, and push your heel against the floor.  You may put a pillow under the heel to make it more comfortable if necessary.   A rehabilitation program following joint replacement surgery can speed recovery and prevent re-injury in the future due to weakened muscles. Contact your doctor or a physical therapist for more information on knee rehabilitation.    CONSTIPATION  Constipation is defined medically as fewer than three stools per week and severe constipation as less than one stool per week.  Even if you have a regular bowel pattern at home, your normal regimen is likely to be disrupted due to multiple reasons following surgery.  Combination of anesthesia, postoperative narcotics, change in appetite and fluid intake all can affect your bowels.   YOU MUST use at least one of the following options; they are listed in order of increasing strength to get the job done.  They are all available over the counter, and you may need to use some, POSSIBLY even all of these options:    Drink plenty of fluids (prune juice may be helpful) and high fiber foods Colace 100 mg by mouth twice a day  Senokot for constipation as directed and as needed Dulcolax (bisacodyl), take with full glass of water  Miralax (polyethylene glycol) once or twice a day as needed.  If you have tried all these things and are unable to have a  bowel movement in the first 3-4 days after surgery call either your surgeon or your primary doctor.    If you experience loose stools or diarrhea, hold the medications until you stool forms back up.  If your symptoms do not get better within 1 week or if they get worse, check with your doctor.  If you experience "the worst abdominal pain ever" or develop nausea or vomiting, please contact the office immediately for further recommendations for treatment.   ITCHING:  If you experience itching with your medications, try taking only a single pain pill, or even half a pain pill at a time.  You can also use Benadryl over the counter for itching or also to help with sleep.   TED HOSE STOCKINGS:  Use stockings on both legs until for at least 2 weeks  or as directed by physician office. They may be removed at night for sleeping.  MEDICATIONS:  See your medication summary on the After Visit Summary that nursing will review with you.  You may have some home medications which will be placed on hold until you complete the course of blood thinner medication.  It is important for you to complete the blood thinner medication as prescribed.  PRECAUTIONS:  If you experience chest pain or shortness of breath - call 911 immediately for transfer to the hospital emergency department.   If you develop a fever greater that 101 F, purulent drainage from wound, increased redness or drainage from wound, foul odor from the wound/dressing, or calf pain - CONTACT YOUR SURGEON.                                                   FOLLOW-UP APPOINTMENTS:  If you do not already have a post-op appointment, please call the office for an appointment to be seen by your surgeon.  Guidelines for how soon to be seen are listed in your After Visit Summary, but are typically between 1-4 weeks after surgery.  OTHER INSTRUCTIONS:   Knee Replacement:  Do not place pillow under knee, focus on keeping the knee straight while resting. CPM  instructions: 0-90 degrees, 2 hours in the morning, 2 hours in the afternoon, and 2 hours in the evening. Place foam block, curve side up under heel at all times except when in CPM or when walking.  DO NOT modify, tear, cut, or change the foam block in any way.  MAKE SURE YOU:   Understand these instructions.   Get help right away if you are not doing well or get worse.    Thank you for letting us be a part of your medical care team.  It is a privilege we respect greatly.  We hope these instructions will help you stay on track for a fast and full recovery!    LEFT SHOULDER INSTRUCTIONS AFTER ORIF PROCEDURE -shoulder immobilizer on at all times. -do not raise arm.  No lifting.

## 2015-02-10 NOTE — Progress Notes (Signed)
Patient discharging to SNF- [facility] given report to [facility]. [place]

## 2015-02-10 NOTE — Progress Notes (Signed)
CNA took pts FSBS this AM as a routine check and noted it was 48. Patient was easily arousable and did not have any complaints. RN observes pt to be minimally diaphoretic. Pt drank 2 cups of orange juice and ate some crackers with peanut butter. Blood sugar increased to 56. Patient drank another cup of orange juice and blood sugar noted to continually rise, as next check read at 96. This RN asked day CNA to follow up in checking FSBS after pt ate breakfast. Now reads 136. Will let day RN know of events and to monitor patient status.

## 2015-02-10 NOTE — Progress Notes (Signed)
Patient discharge to SNF- via [facility] with significant other at bedside. [name]

## 2015-02-10 NOTE — Clinical Social Work Placement (Signed)
   CLINICAL SOCIAL WORK PLACEMENT  NOTE  Date:  02/10/2015  Patient Details  Name: [name] MRN: [id number] Date of Birth: 23-Aug-1940  Clinical Social Work is seeking post-discharge placement for this patient at the [place] level of care (*CSW will initial, date and re-position this form in  chart as items are completed):  Yes   Patient/family provided with [facility] Work Department's list of facilities offering this level of care within the geographic area requested by the patient (or if unable, by the patient's family).  Yes   Patient/family informed of their freedom to choose among providers that offer the needed level of care, that participate in Medicare, Medicaid or managed care program needed by the patient, have an available bed and are willing to accept the patient.  Yes   Patient/family informed of [facility]'s ownership interest in [facility] and [facility], as well as of the fact that they are under no obligation to receive care at these facilities.  PASRR submitted to EDS on 02/09/15     PASRR number received on       Existing PASRR number confirmed on  (n/a)     FL2 transmitted to all facilities in geographic area requested by pt/family on 02/09/15     FL2 transmitted to all facilities within larger geographic area on  (n/a)     Patient informed that his/her managed care company has contracts with or will negotiate with certain facilities, including the following:   (yes, [facility])     Yes   Patient/family informed of bed offers received.  Patient chooses bed at [facility]     Physician recommends and patient chooses bed at      Patient to be transferred to [facility] on 02/10/15.  Patient to be transferred to facility by [facility]     Patient family notified on 02/10/15 of transfer.  Name of family member notified:  Shirlean Schlein     PHYSICIAN       Additional Comment:     _______________________________________________ Caroline Sauger, LCSW 02/10/2015, 3:36 PM

## 2015-02-11 ENCOUNTER — Non-Acute Institutional Stay (SKILLED_NURSING_FACILITY): Payer: Medicare PPO | Admitting: Internal Medicine

## 2015-02-11 DIAGNOSIS — E119 Type 2 diabetes mellitus without complications: Secondary | ICD-10-CM | POA: Diagnosis not present

## 2015-02-11 DIAGNOSIS — S42302D Unspecified fracture of shaft of humerus, left arm, subsequent encounter for fracture with routine healing: Secondary | ICD-10-CM | POA: Diagnosis not present

## 2015-02-11 DIAGNOSIS — S72002D Fracture of unspecified part of neck of left femur, subsequent encounter for closed fracture with routine healing: Secondary | ICD-10-CM

## 2015-02-11 DIAGNOSIS — I10 Essential (primary) hypertension: Secondary | ICD-10-CM | POA: Diagnosis not present

## 2015-02-11 LAB — URINE CULTURE: Culture: 30000

## 2015-02-11 NOTE — Progress Notes (Signed)
Patient ID: [name], female   DOB: [birth year], 74 y.o.   MRN: [id number]    this is an acute visit.  Level care skilled.  Facility [facility].  Chief complaint acute visit status post hospitalization for fall with left hip fracture with repair as well as left humeral fracture status post RIF.  History of present illness.  Patient is a pleasant 74 year old female who was visiting from [place]- while doing laundry she lost her footing and fell on her left side and sustained a left humeral and left hip fracture.   Orthopedics was consulted underwent a total left hip arthroplasty and open ORIF proximal humerus on September 12.   she apparently did nt require transfusion hemoglobin was stable at 8.0.   she does have a history of diabetes type 2 is on glipizide blood sugars appear to be fairly stable in the mid 100s largely today I do see 340 in the afternoon this will have to be monitored she may need sliding scale.   she also has a history of hypertension which was stable in the hospital on Cardizem.   Patient also had a urine culture that showed Escherichia coli she had some tachycardia-she did receive 2 days of IV Rocephin she is on oral Keflex currently.   currently she is not tachycardic does not complain of chest pain or shortness of breath appears to be doing relatively well-when she arrived yesterday she apparently did have possibly some confusion sedation but this has largely resolved today.  Previous medical history.   history of left humeral fracture status post ORIF.  Left hip fracture status post arthroplasty.  Escherichia coli UTI completing Keflex.  Hypertension.  Diabetes type 2.   history of gout     previous surgical history-as noted above.  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   Also proximal humerus ORIF secondary to fracture on 02/07/2015.    medications.  Aspirin 325 mg daily.  Calcium daily.   diltiazem 180 mg daily.   Keflex 500  mg 3 times a day for 3 additional days.   Colace 100 mg daily at bedtime.  Neurontin 300 mg daily at bedtime.  Glipizide 10 mg twice a day.  Robaxin 500 mg every 6 hours when necessary muscle spasms.  Multivitamin daily.   Anaprox 220 mg daily as needed for pain. Percocet 53 25 mg every 4 hours as needed for pain.   Kenalog cream when necessary rash 0.1%.  Uric 40 mg daily.   Temovate cream 1 topically daily when necessary rash   Family medical social history- patient actually lives in [place] have been traveling appear to see friends in her RV when she had the fall she is a former smoker no longer smokes.     review of systems.   in general no complaints fever chills.  Skin does not complain of issues including rashes or itching surgical sites appear to be unremarkable per nursing.  Head ears eyes nose mouth and throat does not complain a sore throat or visual changes-- nursing staff has noted a tongue film.  Respiratory no shortness breath or complaints of cough.  Cardiac does not complain of chest pain does not appear to have significant lower extremity edema.   GI does not complain of nausea or vomiting diarrhea or constipation or abdominal pain.  GU does not really complain of dysuria is being treated for UTI.  Musculoskeletal again does have significant fractures as noted above current pain medication appears to be fairly  effective however she is on oxycodone.  Neurologic does not complain of dizziness headache or numbness.   psych does not complaining of anxiety or depression currently.   Physical exam.  Temperature is 97.4 pulse 88 respirations 20 blood pressure 128/62.  General this is a pleasant elderly resident in no distress lying comfortably in bed.  Her skin is warm and dry she does have staples of her left shoulder area and also left hip area per nursing these are unremarkable from what I could see of the areas I do not see any sign of  infection drainage bleeding or significant surrounding erythema.  Eyes pupils appear reactive to light sclera and conjunctiva are clear visual acuity appears grossly intact.  Oropharynx is clear mucous membranes moist-- she does have a  slight  Light brownish film on her tongue.  Chest is clear to auscultation I could not appreciate any labored breathing or congestion.  Heart is regular rate and rhythm without murmur gallop or rub she does have positive pedal pulses she does not really have significant lower extremity edema.  Abdomen soft nontender positive bowel sounds.  Muscle skeletal-- limited mobility of her left arm and left leg secondary to recent fracture she does have her left arm in a sling radial pulses intact grip strength appears to be intact capillary refill intact moves her right upper and lower extremities appears at baseline with appropriate strength.   neurologic is grossly intact no lateralizing findings her speech is clear.  Psych she is alert and oriented pleasant and appropriate.   Labs.  02/10/2015.  WBC 9.5 hemoglobin 8.0 platelets 248.   sodium 135 potassium 4 BUN 24 creatinine 1.29.   assessment plan. #1left hip fracture with repairclinically appears stable she will have orthopedic follow-up she is on aspirin for anticoagulation for DVT prophylaxis Will switch her to Cymbalta 10 mg daily this was discussed with Dr. Dellia Nims via phone for pain she is receiving Percocetshe's also has naproxen as needed it appears at this point appears to be controlled.  I note she is also on Neurontin-daily at bedtime-she is on calcium supplementation as well  #2 history of left humerus fracture this again was surgically repaired as well she is on pain medication as noted above.  #3 history hypertension at this point appears to be stable she is on diltiazem.   #4 history of diabetes she is on glipizide 10 mg twice a day blood sugars appear to be more in the mid 100s I do see  one spike at 340 today-at this point will monitor she may need sliding scale for the 340 will give her 4 units of NovoLog now.   #5-history UTI she is finishing course of Keflex does not appear to be overtly symptomatic currently.   #6-history gout she is on Uloric I do not see evidence of a flare today.   #7 anemia postop-hemoglobin appears to be stable relatively at 8 will recheck this tomorrow    #8 per chart review I do see that her potassium at one point in the hospital was 5.4 we will update a metabolic panel tomorrow as well to ensure stability she is not on potassium.    #9 history of tongue film Will treat with Magic mouthwash and monitor   #10-history hypertension we have minimal readings so far appears stable she is on diltiazem-   [id number]-of note greater than 40 minutes spent assessing patient-reviewing her chart- and coordinating and formulating a plan of care for numerous diagnoses-of note  greater than 50% of time spent coordinating plan of care      .

## 2015-02-12 ENCOUNTER — Non-Acute Institutional Stay (SKILLED_NURSING_FACILITY): Payer: Medicare PPO | Admitting: Internal Medicine

## 2015-02-12 ENCOUNTER — Encounter (HOSPITAL_COMMUNITY)
Admission: RE | Admit: 2015-02-12 | Discharge: 2015-02-12 | Disposition: A | Discharge status: Home / Self Care | Payer: Medicare PPO | Source: Skilled Nursing Facility | Attending: Internal Medicine | Admitting: Internal Medicine

## 2015-02-12 DIAGNOSIS — R339 Retention of urine, unspecified: Secondary | ICD-10-CM

## 2015-02-12 DIAGNOSIS — Z96642 Presence of left artificial hip joint: Secondary | ICD-10-CM | POA: Diagnosis not present

## 2015-02-12 DIAGNOSIS — E1121 Type 2 diabetes mellitus with diabetic nephropathy: Secondary | ICD-10-CM | POA: Diagnosis not present

## 2015-02-12 DIAGNOSIS — N183 Chronic kidney disease, stage 3 unspecified: Secondary | ICD-10-CM

## 2015-02-12 DIAGNOSIS — I10 Essential (primary) hypertension: Secondary | ICD-10-CM | POA: Diagnosis not present

## 2015-02-12 LAB — CBC WITH DIFFERENTIAL/PLATELET
BASOS ABS: 0 10*3/uL (ref 0.0–0.1)
BASOS PCT: 0 %
Eosinophils Absolute: 0.4 10*3/uL (ref 0.0–0.7)
Eosinophils Relative: 4 %
HEMATOCRIT: 25 % — AB (ref 36.0–46.0)
HEMOGLOBIN: 8 g/dL — AB (ref 12.0–15.0)
LYMPHS PCT: 12 %
Lymphs Abs: 1.3 10*3/uL (ref 0.7–4.0)
MCH: 29.7 pg (ref 26.0–34.0)
MCHC: 32 g/dL (ref 30.0–36.0)
MCV: 92.9 fL (ref 78.0–100.0)
Monocytes Absolute: 0.7 10*3/uL (ref 0.1–1.0)
Monocytes Relative: 6 %
NEUTROS ABS: 8.5 10*3/uL — AB (ref 1.7–7.7)
NEUTROS PCT: 78 %
Platelets: 283 10*3/uL (ref 150–400)
RBC: 2.69 MIL/uL — AB (ref 3.87–5.11)
RDW: 15.5 % (ref 11.5–15.5)
WBC: 10.9 10*3/uL — AB (ref 4.0–10.5)

## 2015-02-12 LAB — BASIC METABOLIC PANEL
ANION GAP: 8 (ref 5–15)
BUN: 30 mg/dL — ABNORMAL HIGH (ref 6–20)
CO2: 28 mmol/L (ref 22–32)
Calcium: 7.4 mg/dL — ABNORMAL LOW (ref 8.9–10.3)
Chloride: 100 mmol/L — ABNORMAL LOW (ref 101–111)
Creatinine, Ser: 1.3 mg/dL — ABNORMAL HIGH (ref 0.44–1.00)
GFR calc non Af Amer: 39 mL/min — ABNORMAL LOW (ref >60)
GFR, EST AFRICAN AMERICAN: 45 mL/min — AB (ref >60)
GLUCOSE: 133 mg/dL — AB (ref 65–99)
POTASSIUM: 3.9 mmol/L (ref 3.5–5.1)
Sodium: 136 mmol/L (ref 135–145)

## 2015-02-12 NOTE — Progress Notes (Signed)
Patient ID: [name], female   DOB: [birth year], 74 y.o.   MRN: [id number]     Facility; [facility] Chief complaint; admission to SNF post admit to [facility] from 9/10 to 02/10/15;  History; this is an independent 74 year old woman who lives in [place]. She has been visiting in [place] was driving back Home. She was doing laundry lost her footing after reaching for something and suffered a left hip fracture as well as a left humeral fracture. She underwent a left total hip arthroplasty and an open ORIF of the proximal humerus on 9/12. She was listed as having an Escherichia coli UTI postop and received 2 days of IV Rocephin and continued on oral Keflex. Her urine culture grew 30,000 Escherichia coli and 20,000 enterococcus both sensitive to ampicillin and therefore should be sensitive to Keflex.  The patient tells me she has no history of falling other than falling in the bathtub 6 years ago. She has had a DEXA scan but doesn't know the results. He is a type II diabetic on oral agents but she can't tell me exactly what she was taking at home. She is currently on glipizide she is listed as having hypo-and hyperglycemia  Past Medical History  Diagnosis Date  . Diabetes type 2   . Arthritis   . HTN (hypertension)   . Gout    Past Surgical History  Procedure Laterality Date  . Total hip arthroplasty Left 02/07/2015    Procedure: TOTAL HIP ARTHROPLASTY ANTERIOR APPROACH ;  Surgeon: Marybelle Killings, MD;  Location: [facility];  Service: Orthopedics;  Laterality: Left;  . Orif humerus fracture Left 02/07/2015    Procedure: OPEN REDUCTION INTERNAL FIXATION (ORIF) PROXIMAL HUMERUS FRACTURE;  Surgeon: Marybelle Killings, MD;  Location: [facility];  Service: Orthopedics;  Laterality: Left;    Current Outpatient Prescriptions on File Prior to Visit  Medication Sig Dispense Refill  . aspirin EC 325 MG EC tablet Take 1 tablet (325 mg total) by mouth daily. 30 tablet 0  . CALCIUM PO Take 1 tablet by  mouth daily.    Marland Kitchen CARTIA XT 180 MG 24 hr capsule Take 1 tablet by mouth daily.    . cephALEXin (KEFLEX) 500 MG capsule Take 1 capsule (500 mg total) by mouth 3 (three) times daily. X 3 days  0  . clobetasol (TEMOVATE) 0.05 % external solution Apply 1 application topically daily as needed. rash    . docusate sodium (COLACE) 100 MG capsule Take 1 capsule (100 mg total) by mouth at bedtime. 10 capsule 0  . gabapentin (NEURONTIN) 300 MG capsule Take 1 capsule by mouth at bedtime.    Marland Kitchen glipiZIDE (GLUCOTROL) 10 MG tablet Take 1 tablet by mouth 2 (two) times daily.    . methocarbamol (ROBAXIN) 500 MG tablet Take 1 tablet (500 mg total) by mouth every 6 (six) hours as needed for muscle spasms. 60 tablet 0  . Multiple Vitamin (MULTIVITAMIN WITH MINERALS) TABS tablet Take 1 tablet by mouth daily.    . naproxen sodium (ANAPROX) 220 MG tablet Take 220 mg by mouth daily as needed (pain).    Marland Kitchen oxyCODONE-acetaminophen (PERCOCET/ROXICET) 5-325 MG per tablet Take 1 tablet by mouth every 4 (four) hours as needed for moderate pain. 60 tablet 0  . triamcinolone cream (KENALOG) 0.1 % Apply 1 application topically daily as needed. rash    . ULORIC 40 MG tablet Take 1 tablet by mouth daily.      Socially; patient lives  in [place] independently. She is not a smoker. No real falling history no ambulatory assist device  reports that she has quit smoking. She does not have any smokeless tobacco history on file.  Review of systems General: no weight loss HEENT no oral problems no sore throat Respiratory no cough no sputum Cardiac no chest pain or palpitations GI states her bowels are moving no abdominal pain GU feels like she cannot void, dysuria Musculoskeletal; has a history of gout mostly involving her feet and joints in her legs. None currently. Neurologic she does not have a history of falling Endocrine; on oral agents for the last 20 years. Not currently aware of her hemoglobin A1c. Was not one done in  the hospital  Physical examination Gen. the patient is not in any distress. Discusses her situation clearly Vitals; O2 sat is 92% on room air respirations 18 and unlabored pulse rate 71 HEENT no oral lesions are seen. Her tongue is coated Respiratory clear entry bilaterally Cardiac; heart sounds are normal no murmurs no gallops she appears to be euvolemic Abdomen; she is distended bowel sounds present and rashes. No liver no spleen no tenderness no masses GU; there is suprapubic tenderness however I cannot obviously detect a distended bladder. Musculoskeletal; left hip incision looks very stable left leg is without any evidence of a DVT. I did not look at her left shoulder incision Neurologic; seems able to move all her limbs Mental status; she is bright alert no overt depression or delirium Skin; no pressure areas on her heels. She is at high risk for pressure ulcers. I will have the staff check her coccyx  Impression/plan #1 status post left hip arthroplasty and ORIF of the left arm. This is a particularly difficult set of fractures in terms of rehabilitation. #2 I agree with changing this woman to Xarelto from an aspirin. She had is at very high risk for DVT. Her aspirin should be discontinued. 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 There are comments in the hospital about hyper and hypoglycemia. I do not see a hemoglobin A1c. I'll research the actual CBG values. #4 hypertension we'll monitor while she is here. #5 gout; there is no evidence of active joints. Patient's in the postoperative or post hospitalization state are often at risk for acute flares. Continue her urologic #6 chronic renal failure stage III #7 listed as having a UTI in the hospital, small amounts of 2 different organisms. This may have been contamination. She is on Keflex which should cover those organisms however #8 tip. Blood sugars yesterday 164/160/340/213. This morning she was 62. I  will check a hemoglobin A1c on her. She has likely some degree of chronic renal failure. She should be on an ACE inhibitor for this or an ARB. Especially in the face of her hypertension. #9? Urinary retention. A bladder scan and I did now is 335 patient states she cannot void. I'll give her about another hour or so to see if she can pass her urine if not she may need catheterization.   CBC Latest Ref Rng 02/12/2015 02/10/2015 02/09/2015  WBC 4.0 - 10.5 K/uL 10.9(H) 9.5 9.7  Hemoglobin 12.0 - 15.0 g/dL 8.0(L) 8.0(L) 8.8(L)  Hematocrit 36.0 - 46.0 % 25.0(L) 26.0(L) 27.2(L)  Platelets 150 - 400 K/uL 283 248 235   BMP Latest Ref Rng 02/12/2015 02/10/2015 02/09/2015  Glucose 65 - 99 mg/dL 133(H) 55(L) 233(H)  BUN 6 - 20 mg/dL 30(H) 24(H) 26(H)  Creatinine  0.44 - 1.00 mg/dL 1.30(H) 1.29(H) 1.57(H)  Sodium 135 - 145 mmol/L 136 135 130(L)  Potassium 3.5 - 5.1 mmol/L 3.9 4.0 5.4(H)  Chloride 101 - 111 mmol/L 100(L) 101 96(L)  CO2 22 - 32 mmol/L 28 26 25   Calcium 8.9 - 10.3 mg/dL 7.4(L) 7.5(L) 7.8(L)   Estimated Creatinine Clearance: 38.7 mL/min (by C-G formula based on Cr of 1.3).

## 2015-02-13 ENCOUNTER — Encounter: Payer: Self-pay | Admitting: Internal Medicine

## 2015-02-13 LAB — CULTURE, BLOOD (ROUTINE X 2)
Culture: NO GROWTH
Culture: NO GROWTH

## 2015-02-14 ENCOUNTER — Non-Acute Institutional Stay (SKILLED_NURSING_FACILITY): Payer: Medicare PPO | Admitting: Internal Medicine

## 2015-02-14 ENCOUNTER — Encounter (HOSPITAL_COMMUNITY)
Admission: RE | Admit: 2015-02-14 | Discharge: 2015-02-14 | Disposition: A | Discharge status: Home / Self Care | Payer: Medicare PPO | Source: Skilled Nursing Facility | Attending: Internal Medicine | Admitting: Internal Medicine

## 2015-02-14 DIAGNOSIS — N183 Chronic kidney disease, stage 3 unspecified: Secondary | ICD-10-CM

## 2015-02-14 DIAGNOSIS — E1121 Type 2 diabetes mellitus with diabetic nephropathy: Secondary | ICD-10-CM | POA: Diagnosis not present

## 2015-02-14 NOTE — Progress Notes (Signed)
Patient ID: [name], female   DOB: 12/16/1940, 74 y.o.   MRN: [id number]

## 2015-02-15 LAB — HEMOGLOBIN A1C
HEMOGLOBIN A1C: 7.6 % — AB (ref 4.8–5.6)
Mean Plasma Glucose: 171 mg/dL

## 2015-02-16 ENCOUNTER — Other Ambulatory Visit: Payer: Self-pay | Admitting: *Deleted

## 2015-02-16 MED ORDER — OXYCODONE-ACETAMINOPHEN 5-325 MG PO TABS: 1.0000 | ORAL_TABLET | ORAL | Status: DC | PRN

## 2015-02-19 NOTE — Progress Notes (Addendum)
Patient ID: [name], female   DOB: 25-Apr-1941, 74 y.o.   MRN: [id number]                PROGRESS NOTE  DATE:  02/14/2015        FACILITY: [facility]         LEVEL OF CARE:   SNF   Acute Visit             CHIEF COMPLAINT:  Follow up diabetes.      HISTORY OF PRESENT ILLNESS:  This is a 74 year-old woman who came to [place] after suffering a left hip fracture as well as a left humerus fracture.  She underwent a left hip arthroplasty and an ORIF of the proximal humerus.    The patient is a type 2 diabetic.  She has some degree of renal insufficiency.  She is on glipizide at 10 mg b.i.d.   She tells me that her blood sugars have probably not been under great control, quoting me a possible hemoglobin A1c of 8.  Her doctor tried to add additional medications, although there are affordability issues.  So far, her blood sugars on Saturday, 02/12/2015, were 62, 189, 166, and 126.  Yesterday, 02/13/2015:  92, 221, 286, and 161.  I do not yet have her hemoglobin A1c.     REVIEW OF SYSTEMS:    GENERAL:  The patient is not feeling systemically ill, although she is somewhat despondent about her current situation.   CHEST/RESPIRATORY:  No shortness of breath.   CARDIAC:  No chest pain.   GI:   No nausea, vomiting, or diarrhea.         GU:  She tells me she has urinary frequency secondary to what sounds like a uterine prolapse.  She is not having dysuria.  I had some concerns when I saw her two days ago about voiding issues, although she did not have urinary retention.  ENDOCRINE: She could not afford a DDP-4 I believe.       PHYSICAL EXAMINATION:   GENERAL APPEARANCE:  The patient is not in any distress.         CHEST/RESPIRATORY:  Clear air entry bilaterally.    CARDIOVASCULAR:   CARDIAC:  Heart sounds are normal.  There are no murmurs.    GASTROINTESTINAL:   ABDOMEN:  Somewhat distended.  No masses.     LIVER/SPLEEN/KIDNEYS:  No liver, no spleen.  No tenderness.       GENITOURINARY:   BLADDER:  Not distended.  No tenderness.  No CVA tenderness.    CIRCULATION:   EDEMA/VARICOSITIES:  Extremities:  There is no edema.    ASSESSMENT/PLAN:                  Type 2 diabetes with nephropathy.   The patient tells me she can ill afford expensive diabetes medications.  I have changed her from pure glipizide to glipizide/metformin at 5/500, 1 p.o. b.i.d.   By the time I review this, I should have her hemoglobin A1c.  She tells me the last value she had was 8, although she is not a completely thorough historian.    Stage 3 chronic renal failure. Will monitor  Answered may questions about when she will be able to be transported to [place] by camper. Told her not before 3 weeks assurming her rehab goes well

## 2015-02-23 ENCOUNTER — Other Ambulatory Visit (HOSPITAL_COMMUNITY)
Admission: RE | Admit: 2015-02-23 | Discharge: 2015-02-23 | Disposition: A | Discharge status: Home / Self Care | Payer: Medicare PPO | Source: Skilled Nursing Facility | Attending: Internal Medicine | Admitting: Internal Medicine

## 2015-02-23 ENCOUNTER — Encounter: Payer: Self-pay | Admitting: Internal Medicine

## 2015-02-23 ENCOUNTER — Non-Acute Institutional Stay (SKILLED_NURSING_FACILITY): Payer: Medicare PPO | Admitting: Internal Medicine

## 2015-02-23 DIAGNOSIS — I10 Essential (primary) hypertension: Secondary | ICD-10-CM

## 2015-02-23 DIAGNOSIS — D631 Anemia in chronic kidney disease: Secondary | ICD-10-CM | POA: Diagnosis not present

## 2015-02-23 DIAGNOSIS — Z7901 Long term (current) use of anticoagulants: Secondary | ICD-10-CM

## 2015-02-23 DIAGNOSIS — S72002D Fracture of unspecified part of neck of left femur, subsequent encounter for closed fracture with routine healing: Secondary | ICD-10-CM | POA: Diagnosis not present

## 2015-02-23 DIAGNOSIS — S42302D Unspecified fracture of shaft of humerus, left arm, subsequent encounter for fracture with routine healing: Secondary | ICD-10-CM

## 2015-02-23 DIAGNOSIS — N189 Chronic kidney disease, unspecified: Secondary | ICD-10-CM | POA: Diagnosis not present

## 2015-02-23 LAB — CBC WITH DIFFERENTIAL/PLATELET
BASOS PCT: 0 %
Basophils Absolute: 0 10*3/uL (ref 0.0–0.1)
EOS ABS: 0.1 10*3/uL (ref 0.0–0.7)
Eosinophils Relative: 1 %
HCT: 34 % — ABNORMAL LOW (ref 36.0–46.0)
Hemoglobin: 10.4 g/dL — ABNORMAL LOW (ref 12.0–15.0)
Lymphocytes Relative: 19 %
Lymphs Abs: 2 10*3/uL (ref 0.7–4.0)
MCH: 29.1 pg (ref 26.0–34.0)
MCHC: 30.6 g/dL (ref 30.0–36.0)
MCV: 95 fL (ref 78.0–100.0)
MONO ABS: 0.6 10*3/uL (ref 0.1–1.0)
MONOS PCT: 6 %
Neutro Abs: 7.8 10*3/uL — ABNORMAL HIGH (ref 1.7–7.7)
Neutrophils Relative %: 74 %
Platelets: 260 10*3/uL (ref 150–400)
RBC: 3.58 MIL/uL — ABNORMAL LOW (ref 3.87–5.11)
RDW: 16.4 % — AB (ref 11.5–15.5)
WBC: 10.5 10*3/uL (ref 4.0–10.5)

## 2015-02-23 LAB — BASIC METABOLIC PANEL
Anion gap: 12 (ref 5–15)
BUN: 40 mg/dL — ABNORMAL HIGH (ref 6–20)
CALCIUM: 8.6 mg/dL — AB (ref 8.9–10.3)
CO2: 27 mmol/L (ref 22–32)
CREATININE: 1.97 mg/dL — AB (ref 0.44–1.00)
Chloride: 96 mmol/L — ABNORMAL LOW (ref 101–111)
GFR calc non Af Amer: 24 mL/min — ABNORMAL LOW (ref >60)
GFR, EST AFRICAN AMERICAN: 27 mL/min — AB (ref >60)
GLUCOSE: 251 mg/dL — AB (ref 65–99)
Potassium: 5 mmol/L (ref 3.5–5.1)
Sodium: 135 mmol/L (ref 135–145)

## 2015-02-23 NOTE — Progress Notes (Signed)
Patient ID: [name], female   DOB: 07/06/1940, 74 y.o.   MRN: [id number]      this is an acute visit.  Level care skilled.  Facility [facility].  Chief complaint  Acute visit follow-up hypertension-anticoagulation issues with history of left hip fracture with repair and left humeral fracture-follow-up renal insufficiency-anemia  History of present illness.  Patient is a pleasant 74 year old female who was visiting from [place]- while doing laundry she lost her footing and fell on her left side and sustained a left humeral and left hip fracture.   Orthopedics was consulted underwent a total left hip arthroplasty and open ORIF proximal humerus on September 12.   she apparently did nt require transfusion hemoglobin was stable at 8.0.    Marland Kitchen   she also has a history of hypertension which was stable in the hospital on Cardizem.  Shortly after her arrival at the facility she was switched from aspirin to Cymbalta oh for anticoagulation secondary to her high risk for a DVT-pharmacy has left [place] a note saying there may be an interaction between the Xarelto and Cardizem-as well as Naprosyn she is on-t could be at increased risk of bleeding with the renal insufficiency--.  Currently her vital signs are stable recent blood pressures 116/54 141/59 134/63 I got 136/72 manually today-pulse rate appears to be in the 70s and 80s.  She reports she is feeling more energetic apparently is doing fairly well with therapy does not really have any acute complaints-she did complain of constipation earlier but says she has had a large bowel movement.    Previous medical history.   history of left humeral fracture status post ORIF.  Left hip fracture status post arthroplasty.  Escherichia coli UTI completing Keflex.  Hypertension.  Diabetes type 2.   history of gout     previous surgical history-as noted above.  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   Also proximal humerus ORIF  secondary to fracture on 02/07/2015.    medications.  Aspirin 325 mg daily.  Calcium daily.   diltiazem 180 mg daily.   Keflex 500 mg 3 times a day for 3 additional days.   Colace 100 mg daily at bedtime.  Neurontin 300 mg daily at bedtime.  Glipizide 10 mg twice a day.  Robaxin 500 mg every 6 hours when necessary muscle spasms.  Multivitamin daily.   Anaprox 220 mg daily as needed for pain. Percocet 53 25 mg every 4 hours as needed for pain.   Kenalog cream when necessary rash 0.1%.  Uric 40 mg daily.   Temovate cream 1 topically daily when necessary rash   Family medical social history- patient actually lives in [place] have been traveling appear to see friends in her RV when she had the fall she is a former smoker no longer smokes.     review of systems.   in general no complaints fever chills.  Skin does not complain of issues including rashes or itching -per nursing surgical site left hip is stable  Head ears eyes nose mouth and throat does not complain a sore throat or visual changes   Respiratory no shortness breath or complaints of cough.  Cardiac does not complain of chest pain does not appear to have significant lower extremity edema.   GI does not complain of nausea or vomiting diarrhea -states she had been constipated but had a large bowel movement today  GU does not really complain of dysuria status post treatment for UTI.  Musculoskeletal  again does have significant fractures as noted above current pain medication appears to be fairly effective however she is on oxycodone.  Neurologic does not complain of dizziness headache or numbness.   psych does not complaining of anxiety or depression currently.   Physical exam.  Temperature 97.9 pulse 82 respirations 20 blood pressure 116/54-taken manually today 136/72  General this is a pleasant elderly resident in no distress .  Her skin is warm and dry  Per nursing surgical sites left  hip and left shoulder area appeared to be stable without sign of infection.  Eyes pupils appear reactive to light sclera and conjunctiva are clear visual acuity appears grossly intact.  Oropharynx is clear mucous membranes moist-- she does have a  slight  Light brownish film on her tongue.  Chest is clear to auscultation I could not appreciate any labored breathing or congestion.  Heart is regular rate and rhythm without murmur gallop or rub she does have positive pedal pulses she does not really have significant lower extremity edema.  Abdomen soft nontender positive bowel sounds.  Muscle skeletal-- limited mobility of her left arm and left leg secondary to recent fracture she does have her left arm in a sling radial pulses intact grip strength appears to be intact capillary refill intact moves her right upper and lower extremities appears at baseline with appropriate strength.   neurologic is grossly intact no lateralizing findings her speech is clear.  Psych she is alert and oriented pleasant and appropriate.   Labs.  02/12/2015.  Sodium 136 potassium 3.9 BUN 30 creatinine 1.3.  WBC 10.9 hemoglobin 8.0 platelets 283.    02/10/2015.  WBC 9.5 hemoglobin 8.0 platelets 248.   sodium 135 potassium 4 BUN 24 creatinine 1.29.   assessment plan. #1left hip fracture with repairclinically appears stable she will have orthopedic follow-up  For anticoagulation she has been switched to Xarelto-at this point appear stable will update a CBC to ensure stability of her hemoglobin  #2 history of left humerus fracture this again was surgically repaired as well--us appears to be stable  #3 history hypertension at this point appears to be stable she is on diltiazem--however there are concerns of drug interaction between the diltiazem and her Xarelro-at this point will discontinue the diltiazem and monitor her blood pressures closely every shift with a log for review for follow-up-if pressures  pulses are elevated we will need to institute another medication.   #4 pain management she continues on oxycodone we'll discontinue her Naprosyn as well secondary to concerns of increased bleeding risk this was a when necessary medication she appears to be stable in this regard some pain appears to be controlled but this will have to be watched as well.  #5 history of anemia thought be postop apparently she did not require transfusion will update a CBC.  #6 history of renal insufficiency creatinine appear to be at baseline at 1.3 on recent lab will update this to ensure stability  [id number]       .

## 2015-02-25 ENCOUNTER — Encounter (HOSPITAL_COMMUNITY)
Admission: RE | Admit: 2015-02-25 | Discharge: 2015-02-25 | Disposition: A | Discharge status: Home / Self Care | Payer: Medicare PPO | Source: Skilled Nursing Facility | Attending: Internal Medicine | Admitting: Internal Medicine

## 2015-02-25 ENCOUNTER — Non-Acute Institutional Stay (SKILLED_NURSING_FACILITY): Payer: Medicare PPO | Admitting: Internal Medicine

## 2015-02-25 ENCOUNTER — Encounter: Payer: Self-pay | Admitting: Internal Medicine

## 2015-02-25 DIAGNOSIS — D62 Acute posthemorrhagic anemia: Secondary | ICD-10-CM | POA: Diagnosis not present

## 2015-02-25 DIAGNOSIS — I10 Essential (primary) hypertension: Secondary | ICD-10-CM | POA: Diagnosis not present

## 2015-02-25 DIAGNOSIS — N289 Disorder of kidney and ureter, unspecified: Secondary | ICD-10-CM

## 2015-02-25 LAB — CBC WITH DIFFERENTIAL/PLATELET
BASOS PCT: 1 %
Basophils Absolute: 0.1 10*3/uL (ref 0.0–0.1)
Eosinophils Absolute: 0.2 10*3/uL (ref 0.0–0.7)
Eosinophils Relative: 2 %
HEMATOCRIT: 30.6 % — AB (ref 36.0–46.0)
Hemoglobin: 9.5 g/dL — ABNORMAL LOW (ref 12.0–15.0)
LYMPHS PCT: 23 %
Lymphs Abs: 1.7 10*3/uL (ref 0.7–4.0)
MCH: 29 pg (ref 26.0–34.0)
MCHC: 31 g/dL (ref 30.0–36.0)
MCV: 93.3 fL (ref 78.0–100.0)
MONO ABS: 0.5 10*3/uL (ref 0.1–1.0)
MONOS PCT: 7 %
NEUTROS ABS: 5 10*3/uL (ref 1.7–7.7)
Neutrophils Relative %: 67 %
Platelets: 200 10*3/uL (ref 150–400)
RBC: 3.28 MIL/uL — ABNORMAL LOW (ref 3.87–5.11)
RDW: 16.3 % — AB (ref 11.5–15.5)
WBC: 7.4 10*3/uL (ref 4.0–10.5)

## 2015-02-25 LAB — BASIC METABOLIC PANEL
ANION GAP: 9 (ref 5–15)
BUN: 39 mg/dL — ABNORMAL HIGH (ref 6–20)
CALCIUM: 8.6 mg/dL — AB (ref 8.9–10.3)
CO2: 26 mmol/L (ref 22–32)
Chloride: 102 mmol/L (ref 101–111)
Creatinine, Ser: 1.67 mg/dL — ABNORMAL HIGH (ref 0.44–1.00)
GFR calc Af Amer: 33 mL/min — ABNORMAL LOW (ref >60)
GFR calc non Af Amer: 29 mL/min — ABNORMAL LOW (ref >60)
GLUCOSE: 140 mg/dL — AB (ref 65–99)
Potassium: 4.8 mmol/L (ref 3.5–5.1)
Sodium: 137 mmol/L (ref 135–145)

## 2015-02-25 NOTE — Progress Notes (Signed)
Patient ID: [name], female   DOB: 09-Jan-1941, 74 y.o.   MRN: [id number]       this is an acute visit.  Level care skilled.  Facility [facility].  Chief complaint   Acute visit secondary to renal insufficiency  History of present illness.  Patient is a pleasant 74 year old female who was visiting from [place]- while doing laundry she lost her footing and fell on her left side and sustained a left humeral and left hip fracture.   Orthopedics was consulted underwent a total left hip arthroplasty and open ORIF proximal humerus on September 12.   she apparently did not  require transfusion hemoglobin was stable at 8.0.--This has since risen up to 10.4---    .   she also has a history of hypertension which was stable in the hospital on Cardizem.  Shortly after her arrival at the facility she was switched from aspirin to [facility] for anticoagulation secondary to her high risk for a DVT-pharmacy h left [place] a note saying there may be an interaction between the Xarelto and Cardizem-as well as Naprosyn she is on-t could be at increased risk of bleeding with the renal insufficiency-- Her Naprosyn has been discontinued early she did not take this often-Cardizem also was discontinued-however her blood pressure appears to have tolerated this okay most recently 136/63-129/55.  She does have a history of chronic renal insufficiency lab done on September 28 showed a creatinine 1.97 BUN of 40 which appears to be on the higher end of her baseline appears previous creatinines in the hospital ranged from 1.29--up to 1.57.  She reports she is eating and drinking well-she is also having no problems with urinating or dysuria.    Previous medical history.   history of left humeral fracture status post ORIF.  Left hip fracture status post arthroplasty.  Escherichia coli UTI completing Keflex.  Hypertension.  Diabetes type 2.   history of gout  Chronic kidney disease     previous  surgical history-as noted above.  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   Also proximal humerus ORIF secondary to fracture on 02/07/2015.    medications.  Aspirin 325 mg daily.  Calcium daily.  .   Colace 100 mg daily at bedtime.  Neurontin 300 mg daily at bedtime.  Glipizide 10 mg twice a day.  Robaxin 500 mg every 6 hours when necessary muscle spasms.  Multivitamin daily.  Percocet 53 25 mg every 4 hours as needed for pain.   Kenalog cream when necessary rash 0.1%.  Uric 40 mg daily.   Temovate cream 1 topically daily when necessary rash   Family medical social history- patient actually lives in [place] have been traveling appear to see friends in her RV when she had the fall she is a former smoker no longer smokes.     review of systems.   in general no complaints fever chills.  Skin does not complain of issues including rashes or itching -per nursing surgical site left hip is stable  Head ears eyes nose mouth and throat does not complain a sore throat or visual changes   Respiratory no shortness breath or complaints of cough.  Cardiac does not complain of chest pain does not appear to have significant lower extremity edema.   GI does not complain of nausea or vomiting diarrhea - Or constipation currently GU does not really complain of dysuria status post treatment for UTI.--Says she is urinating well  Musculoskeletal again does have significant fractures as  noted above current pain medication appears to be fairly effective however she is on oxycodone.  Neurologic does not complain of dizziness headache or numbness.   psych does not complaining of anxiety or depression currently.   Physical exam.  Temperature 98.1 pulse 84 respirations 18 blood pressure 136/63  General this is a pleasant elderly resident in no distress .  Her skin is warm and dry --skin turgor appears to be intact  Per nursing surgical sites left hip and left shoulder  area appeared to be stable without sign of infection.  Eyes pupils appear reactive to light sclera and conjunctiva are clear visual acuity appears grossly intact.  Oropharynx is clear mucous membranes moist-- she does have a  slight  Light brownish film on her tongue.  Chest is clear to auscultation I could not appreciate any labored breathing or congestion.  Heart is regular rate and rhythm without murmur gallop or rub she does have positive pedal pulses she does not really have significant lower extremity edema.  Abdomen soft nontender positive bowel sounds.  GU cannot appreciate any suprapubic discomfort  Muscle skeletal-- limited mobility of her left arm and left leg secondary to recent fracture she does have her left arm in a sling radial pulses intact grip strength appears to be intact capillary refill intact moves her right upper and lower extremities appears at baseline with appropriate strength.   neurologic is grossly intact no lateralizing findings her speech is clear.  Psych she is alert and oriented pleasant and appropriate.   Labs.  02/23/2015.  Sodium 135 potassium 5 BUN 40 creatinine 1.97  02/12/2015.  Sodium 136 potassium 3.9 BUN 30 creatinine 1.3.  WBC 10.9 hemoglobin 8.0 platelets 283.    02/10/2015.  WBC 9.5 hemoglobin 8.0 platelets 248.   sodium 135 potassium 4 BUN 24 creatinine 1.29.   assessment plan.  #1-history renal insufficiency-this appears to have progressed slightly with a creatinine of 1.97 BUN of 40 Will update this today to see where we stand clinically she appears to be stable eating drinking well does not complain of any urinary retention physical exam was quite benign-she is not in any diaphoretic or ACE inhibitor at this point will await update labs.    #2 history hypertension at this point appears to be stable  --She is now off the Cardizem secondary to drug interaction with her Cymbalta-at this point continue to  monitor    #3-pain management this appears stable on oxycodone the Naprosyn when necessary has been discontinued  #4 history of anemia thought be postop apparently she did not require a transfusion-hemoglobin has risen up to 10.4   Update-we have obtained updated labs shows creatinine has improved somewhat down to 1.67 BUN of 39-sodium was 137 potassium 4.8-at this point will monitor and update a metabolic panel next week clinically again she appears to be doing well.  I do note her hemoglobin is 9.5 on most recent lab this is slightly down from previous level although significantly up from postop level-will update this next week to ensure stability there's been no report of any rectal bleeding she is feeling stronger-at this point will monitor and update lab    [phone]       .

## 2015-02-28 ENCOUNTER — Encounter (HOSPITAL_COMMUNITY)
Admission: RE | Admit: 2015-02-28 | Discharge: 2015-02-28 | Disposition: A | Discharge status: Home / Self Care | Payer: Medicare PPO | Source: Skilled Nursing Facility | Attending: Internal Medicine | Admitting: Internal Medicine

## 2015-02-28 DIAGNOSIS — N183 Chronic kidney disease, stage 3 (moderate): Secondary | ICD-10-CM | POA: Insufficient documentation

## 2015-02-28 DIAGNOSIS — Z96642 Presence of left artificial hip joint: Secondary | ICD-10-CM | POA: Insufficient documentation

## 2015-02-28 LAB — CBC WITH DIFFERENTIAL/PLATELET
BASOS ABS: 0 10*3/uL (ref 0.0–0.1)
BASOS PCT: 1 %
Eosinophils Absolute: 0.2 10*3/uL (ref 0.0–0.7)
Eosinophils Relative: 3 %
HEMATOCRIT: 30.1 % — AB (ref 36.0–46.0)
Hemoglobin: 9.3 g/dL — ABNORMAL LOW (ref 12.0–15.0)
LYMPHS PCT: 24 %
Lymphs Abs: 1.5 10*3/uL (ref 0.7–4.0)
MCH: 28.6 pg (ref 26.0–34.0)
MCHC: 30.9 g/dL (ref 30.0–36.0)
MCV: 92.6 fL (ref 78.0–100.0)
MONO ABS: 0.5 10*3/uL (ref 0.1–1.0)
Monocytes Relative: 8 %
NEUTROS ABS: 4.2 10*3/uL (ref 1.7–7.7)
Neutrophils Relative %: 64 %
PLATELETS: 220 10*3/uL (ref 150–400)
RBC: 3.25 MIL/uL — AB (ref 3.87–5.11)
RDW: 16.3 % — AB (ref 11.5–15.5)
WBC: 6.4 10*3/uL (ref 4.0–10.5)

## 2015-02-28 LAB — BASIC METABOLIC PANEL
ANION GAP: 8 (ref 5–15)
BUN: 32 mg/dL — ABNORMAL HIGH (ref 6–20)
CALCIUM: 8.6 mg/dL — AB (ref 8.9–10.3)
CO2: 28 mmol/L (ref 22–32)
Chloride: 103 mmol/L (ref 101–111)
Creatinine, Ser: 1.35 mg/dL — ABNORMAL HIGH (ref 0.44–1.00)
GFR, EST AFRICAN AMERICAN: 43 mL/min — AB (ref >60)
GFR, EST NON AFRICAN AMERICAN: 37 mL/min — AB (ref >60)
Glucose, Bld: 129 mg/dL — ABNORMAL HIGH (ref 65–99)
POTASSIUM: 4.7 mmol/L (ref 3.5–5.1)
SODIUM: 139 mmol/L (ref 135–145)

## 2015-03-02 ENCOUNTER — Non-Acute Institutional Stay (SKILLED_NURSING_FACILITY): Payer: Medicare PPO | Admitting: Internal Medicine

## 2015-03-02 DIAGNOSIS — E1121 Type 2 diabetes mellitus with diabetic nephropathy: Secondary | ICD-10-CM

## 2015-03-02 DIAGNOSIS — S72002D Fracture of unspecified part of neck of left femur, subsequent encounter for closed fracture with routine healing: Secondary | ICD-10-CM | POA: Diagnosis not present

## 2015-03-02 DIAGNOSIS — N183 Chronic kidney disease, stage 3 unspecified: Secondary | ICD-10-CM

## 2015-03-06 NOTE — Progress Notes (Addendum)
Patient ID: [name], female   DOB: Apr 03, 1941, 74 y.o.   MRN: [id number]                PROGRESS NOTE  DATE:  03/02/2015            FACILITY: [facility]              LEVEL OF CARE:   SNF   Acute Visit/Discharge Visit   CHIEF COMPLAINT:  Pre-discharge review.     HISTORY OF PRESENT ILLNESS:  This is an independent, 74 year-old woman who comes from [place], [place].  She had been visiting in [place] and was driving back towards [place] in an [place] with a friend.  She fell, suffering a left hip and left humeral fracture.  She had an ORIF of the proximal humerus on 02/07/2015 and a total hip arthroplasty, as well.    She has type 2 diabetes with chronic renal insufficiency stage III with an estimated  GFR of 37.    The patient is doing well.  She has been to see Orthopedics.   She is using a tripod cane in her non-involved right arm.  She was offered a guttered walker, although she does not seem to want this.  They are going to purchase this themselves.    She has been working on stairs.  She feels capable of getting back into her RV and proceeding back home to [place].  Therefore, no home health will be ordered here.    CURRENT MEDICATIONS:  Medication list is reviewed.    LABORATORY DATA:     02/28/2015:   BUN 32, creatinine 1.35, potassium 4.7, total CO2 of 28.    02/14/2015:  Hemoglobin A1c 7.6.   I have her on Glucovance 500 b.i.d. and her blood sugars have really rounded under control nicely, generally in the low to mid 100s at all times during the day, slightly higher at night.    PHYSICAL EXAMINATION:   CHEST/RESPIRATORY:  Exam is clear.        CARDIOVASCULAR:   CARDIAC:  Heart sounds are normal.  There are no murmurs.    GASTROINTESTINAL:   ABDOMEN:  Soft, nontender.     MUSCULOSKELETAL:   EXTREMITIES:   LEFT UPPER EXTREMITY:   She has a brace on the left shoulder and Orthopedics does not want her to begin working on this as of now.     ASSESSMENT/PLAN:                  Status post left hip and left shoulder fractures, as noted.  Left hip arthroplasty and left humerus ORIF.  She has done well.  She will go home with a tripod cane that she will purchase herself.     Chronic renal failure stage III.  She is tolerating the metformin fine and I am going to discharge her on this.    Anemia.  Apparently, the patient has a history of anemia.   Last hemoglobin checked was 9.3.  I did not work this up further.  This may be anemia of chronic disease.  The patient states she has been taking over-the-counter iron at home.  I am going to advise her to have follow-up with her primary physician on this.    Diabetic nephropathy: CBG's are atable  Prescriptions written for Percocet.

## 2015-08-03 DIAGNOSIS — N184 Chronic kidney disease, stage 4 (severe): Secondary | ICD-10-CM | POA: Insufficient documentation

## 2016-09-20 DIAGNOSIS — G629 Polyneuropathy, unspecified: Secondary | ICD-10-CM | POA: Insufficient documentation

## 2016-10-04 ENCOUNTER — Encounter (HOSPITAL_COMMUNITY): Payer: Self-pay | Admitting: *Deleted

## 2016-10-04 ENCOUNTER — Inpatient Hospital Stay (HOSPITAL_COMMUNITY)
Admission: EM | Admit: 2016-10-04 | Discharge: 2016-10-09 | DRG: 377 | Disposition: A | Discharge status: Home / Self Care | Payer: Medicare PPO | Attending: Family Medicine | Admitting: Family Medicine

## 2016-10-04 ENCOUNTER — Ambulatory Visit (INDEPENDENT_AMBULATORY_CARE_PROVIDER_SITE_OTHER): Payer: Medicare PPO | Admitting: Physician Assistant

## 2016-10-04 ENCOUNTER — Encounter: Payer: Self-pay | Admitting: Physician Assistant

## 2016-10-04 ENCOUNTER — Other Ambulatory Visit: Payer: Self-pay

## 2016-10-04 ENCOUNTER — Other Ambulatory Visit (HOSPITAL_COMMUNITY)
Admission: RE | Admit: 2016-10-04 | Discharge: 2016-10-04 | Disposition: A | Discharge status: Home / Self Care | Payer: Medicare PPO | Source: Ambulatory Visit | Attending: Physician Assistant | Admitting: Physician Assistant

## 2016-10-04 VITALS — BP 110/50 | HR 77 | Ht 64.0 in | Wt 186.0 lb

## 2016-10-04 DIAGNOSIS — M109 Gout, unspecified: Secondary | ICD-10-CM | POA: Diagnosis present

## 2016-10-04 DIAGNOSIS — I5043 Acute on chronic combined systolic (congestive) and diastolic (congestive) heart failure: Secondary | ICD-10-CM

## 2016-10-04 DIAGNOSIS — I2510 Atherosclerotic heart disease of native coronary artery without angina pectoris: Secondary | ICD-10-CM | POA: Diagnosis present

## 2016-10-04 DIAGNOSIS — K2970 Gastritis, unspecified, without bleeding: Secondary | ICD-10-CM | POA: Diagnosis present

## 2016-10-04 DIAGNOSIS — I214 Non-ST elevation (NSTEMI) myocardial infarction: Secondary | ICD-10-CM

## 2016-10-04 DIAGNOSIS — N179 Acute kidney failure, unspecified: Secondary | ICD-10-CM | POA: Diagnosis present

## 2016-10-04 DIAGNOSIS — N183 Chronic kidney disease, stage 3 (moderate): Secondary | ICD-10-CM | POA: Diagnosis present

## 2016-10-04 DIAGNOSIS — Z7902 Long term (current) use of antithrombotics/antiplatelets: Secondary | ICD-10-CM

## 2016-10-04 DIAGNOSIS — E785 Hyperlipidemia, unspecified: Secondary | ICD-10-CM | POA: Insufficient documentation

## 2016-10-04 DIAGNOSIS — Z7984 Long term (current) use of oral hypoglycemic drugs: Secondary | ICD-10-CM

## 2016-10-04 DIAGNOSIS — Z7982 Long term (current) use of aspirin: Secondary | ICD-10-CM

## 2016-10-04 DIAGNOSIS — Z888 Allergy status to other drugs, medicaments and biological substances status: Secondary | ICD-10-CM | POA: Diagnosis not present

## 2016-10-04 DIAGNOSIS — Z79899 Other long term (current) drug therapy: Secondary | ICD-10-CM | POA: Diagnosis not present

## 2016-10-04 DIAGNOSIS — N189 Chronic kidney disease, unspecified: Secondary | ICD-10-CM

## 2016-10-04 DIAGNOSIS — D638 Anemia in other chronic diseases classified elsewhere: Secondary | ICD-10-CM | POA: Insufficient documentation

## 2016-10-04 DIAGNOSIS — D123 Benign neoplasm of transverse colon: Secondary | ICD-10-CM | POA: Diagnosis present

## 2016-10-04 DIAGNOSIS — K921 Melena: Secondary | ICD-10-CM | POA: Diagnosis present

## 2016-10-04 DIAGNOSIS — I10 Essential (primary) hypertension: Secondary | ICD-10-CM | POA: Diagnosis present

## 2016-10-04 DIAGNOSIS — D649 Anemia, unspecified: Secondary | ICD-10-CM | POA: Diagnosis present

## 2016-10-04 DIAGNOSIS — D62 Acute posthemorrhagic anemia: Secondary | ICD-10-CM | POA: Diagnosis present

## 2016-10-04 DIAGNOSIS — Z87891 Personal history of nicotine dependence: Secondary | ICD-10-CM | POA: Diagnosis not present

## 2016-10-04 DIAGNOSIS — I130 Hypertensive heart and chronic kidney disease with heart failure and stage 1 through stage 4 chronic kidney disease, or unspecified chronic kidney disease: Secondary | ICD-10-CM | POA: Diagnosis present

## 2016-10-04 DIAGNOSIS — K5730 Diverticulosis of large intestine without perforation or abscess without bleeding: Secondary | ICD-10-CM | POA: Diagnosis present

## 2016-10-04 DIAGNOSIS — Z96642 Presence of left artificial hip joint: Secondary | ICD-10-CM | POA: Diagnosis present

## 2016-10-04 DIAGNOSIS — E118 Type 2 diabetes mellitus with unspecified complications: Secondary | ICD-10-CM | POA: Diagnosis not present

## 2016-10-04 DIAGNOSIS — E875 Hyperkalemia: Secondary | ICD-10-CM | POA: Diagnosis present

## 2016-10-04 DIAGNOSIS — N184 Chronic kidney disease, stage 4 (severe): Secondary | ICD-10-CM

## 2016-10-04 DIAGNOSIS — I5042 Chronic combined systolic (congestive) and diastolic (congestive) heart failure: Secondary | ICD-10-CM | POA: Diagnosis present

## 2016-10-04 DIAGNOSIS — E1122 Type 2 diabetes mellitus with diabetic chronic kidney disease: Secondary | ICD-10-CM | POA: Diagnosis present

## 2016-10-04 DIAGNOSIS — Z833 Family history of diabetes mellitus: Secondary | ICD-10-CM | POA: Diagnosis not present

## 2016-10-04 DIAGNOSIS — K648 Other hemorrhoids: Secondary | ICD-10-CM | POA: Diagnosis present

## 2016-10-04 DIAGNOSIS — E782 Mixed hyperlipidemia: Secondary | ICD-10-CM | POA: Diagnosis present

## 2016-10-04 DIAGNOSIS — I252 Old myocardial infarction: Secondary | ICD-10-CM | POA: Diagnosis not present

## 2016-10-04 DIAGNOSIS — I169 Hypertensive crisis, unspecified: Secondary | ICD-10-CM | POA: Diagnosis present

## 2016-10-04 LAB — CBC WITH DIFFERENTIAL/PLATELET
BASOS ABS: 0 10*3/uL (ref 0.0–0.1)
BASOS PCT: 0 %
Eosinophils Absolute: 0.2 10*3/uL (ref 0.0–0.7)
Eosinophils Relative: 2 %
HEMATOCRIT: 15.8 % — AB (ref 36.0–46.0)
HEMOGLOBIN: 4.9 g/dL — AB (ref 12.0–15.0)
Lymphocytes Relative: 20 %
Lymphs Abs: 1.6 10*3/uL (ref 0.7–4.0)
MCH: 29.5 pg (ref 26.0–34.0)
MCHC: 31 g/dL (ref 30.0–36.0)
MCV: 95.2 fL (ref 78.0–100.0)
Monocytes Absolute: 0.4 10*3/uL (ref 0.1–1.0)
Monocytes Relative: 5 %
NEUTROS PCT: 73 %
Neutro Abs: 5.7 10*3/uL (ref 1.7–7.7)
Platelets: 423 10*3/uL — ABNORMAL HIGH (ref 150–400)
RBC: 1.66 MIL/uL — AB (ref 3.87–5.11)
RDW: 18.3 % — AB (ref 11.5–15.5)
WBC: 7.9 10*3/uL (ref 4.0–10.5)

## 2016-10-04 LAB — BASIC METABOLIC PANEL
ANION GAP: 7 (ref 5–15)
BUN: 68 mg/dL — ABNORMAL HIGH (ref 6–20)
CALCIUM: 8.6 mg/dL — AB (ref 8.9–10.3)
CO2: 20 mmol/L — ABNORMAL LOW (ref 22–32)
Chloride: 108 mmol/L (ref 101–111)
Creatinine, Ser: 3.28 mg/dL — ABNORMAL HIGH (ref 0.44–1.00)
GFR, EST AFRICAN AMERICAN: 15 mL/min — AB (ref >60)
GFR, EST NON AFRICAN AMERICAN: 13 mL/min — AB (ref >60)
Glucose, Bld: 314 mg/dL — ABNORMAL HIGH (ref 65–99)
POTASSIUM: 4.7 mmol/L (ref 3.5–5.1)
Sodium: 135 mmol/L (ref 135–145)

## 2016-10-04 LAB — CBG MONITORING, ED: Glucose-Capillary: 239 mg/dL — ABNORMAL HIGH (ref 65–99)

## 2016-10-04 LAB — TROPONIN I: Troponin I: <0.03 ng/mL (ref <0.03)

## 2016-10-04 LAB — ABO/RH: ABO/RH(D): B POS

## 2016-10-04 LAB — CBC
HEMATOCRIT: 16.4 % — AB (ref 36.0–46.0)
HEMOGLOBIN: 5 g/dL — AB (ref 12.0–15.0)
MCH: 28.9 pg (ref 26.0–34.0)
MCHC: 30.5 g/dL (ref 30.0–36.0)
MCV: 94.8 fL (ref 78.0–100.0)
Platelets: 456 10*3/uL — ABNORMAL HIGH (ref 150–400)
RBC: 1.73 MIL/uL — AB (ref 3.87–5.11)
RDW: 18.2 % — ABNORMAL HIGH (ref 11.5–15.5)
WBC: 8 10*3/uL (ref 4.0–10.5)

## 2016-10-04 LAB — GLUCOSE, CAPILLARY: Glucose-Capillary: 130 mg/dL — ABNORMAL HIGH (ref 65–99)

## 2016-10-04 LAB — PREPARE RBC (CROSSMATCH)

## 2016-10-04 MED ORDER — SODIUM CHLORIDE 0.9 % IV SOLN
10.0000 mL/h | Freq: Once | INTRAVENOUS | Status: AC
  Administered 2016-10-04: 10 mL/h via INTRAVENOUS

## 2016-10-04 MED ORDER — ONDANSETRON HCL 4 MG/2ML IJ SOLN: 4.0000 mg | Freq: Four times a day (QID) | INTRAMUSCULAR | Status: DC | PRN

## 2016-10-04 MED ORDER — FENOFIBRATE 160 MG PO TABS
160.0000 mg | ORAL_TABLET | Freq: Every day | ORAL | Status: DC
  Administered 2016-10-05 – 2016-10-09 (×5): 160 mg via ORAL
  Filled 2016-10-04 (×5): qty 1

## 2016-10-04 MED ORDER — INSULIN ASPART 100 UNIT/ML ~~LOC~~ SOLN: 0.0000 [IU] | Freq: Every day | SUBCUTANEOUS | Status: DC

## 2016-10-04 MED ORDER — INSULIN ASPART 100 UNIT/ML ~~LOC~~ SOLN
0.0000 [IU] | Freq: Three times a day (TID) | SUBCUTANEOUS | Status: DC
Start: 2016-10-05 — End: 2016-10-05

## 2016-10-04 MED ORDER — SODIUM CHLORIDE 0.9 % IV SOLN: Freq: Once | INTRAVENOUS | Status: DC

## 2016-10-04 MED ORDER — LEVOTHYROXINE SODIUM 25 MCG PO TABS
25.0000 ug | ORAL_TABLET | Freq: Every day | ORAL | Status: DC
  Administered 2016-10-05 – 2016-10-09 (×5): 25 ug via ORAL
  Filled 2016-10-04 (×5): qty 1

## 2016-10-04 MED ORDER — PANTOPRAZOLE SODIUM 40 MG IV SOLR
40.0000 mg | Freq: Two times a day (BID) | INTRAVENOUS | Status: DC
  Administered 2016-10-05 – 2016-10-09 (×9): 40 mg via INTRAVENOUS
  Filled 2016-10-04 (×9): qty 40

## 2016-10-04 MED ORDER — AMLODIPINE BESYLATE 10 MG PO TABS
10.0000 mg | ORAL_TABLET | Freq: Every day | ORAL | Status: DC
  Administered 2016-10-05 – 2016-10-09 (×4): 10 mg via ORAL
  Filled 2016-10-04 (×5): qty 1

## 2016-10-04 MED ORDER — METOPROLOL TARTRATE 12.5 MG HALF TABLET
12.5000 mg | ORAL_TABLET | Freq: Two times a day (BID) | ORAL | Status: DC
  Administered 2016-10-05 – 2016-10-09 (×9): 12.5 mg via ORAL
  Filled 2016-10-04 (×9): qty 1

## 2016-10-04 MED ORDER — ACETAMINOPHEN 650 MG RE SUPP: 650.0000 mg | Freq: Four times a day (QID) | RECTAL | Status: DC | PRN

## 2016-10-04 MED ORDER — ACETAMINOPHEN 325 MG PO TABS: 650.0000 mg | ORAL_TABLET | Freq: Four times a day (QID) | ORAL | Status: DC | PRN

## 2016-10-04 MED ORDER — GABAPENTIN 300 MG PO CAPS
300.0000 mg | ORAL_CAPSULE | Freq: Two times a day (BID) | ORAL | Status: DC
  Administered 2016-10-04 – 2016-10-09 (×10): 300 mg via ORAL
  Filled 2016-10-04 (×11): qty 1

## 2016-10-04 MED ORDER — CLOPIDOGREL BISULFATE 75 MG PO TABS
75.0000 mg | ORAL_TABLET | Freq: Every day | ORAL | Status: DC
  Administered 2016-10-05 – 2016-10-09 (×5): 75 mg via ORAL
  Filled 2016-10-04 (×5): qty 1

## 2016-10-04 MED ORDER — SODIUM CHLORIDE 0.9 % IV SOLN
INTRAVENOUS | Status: DC
  Administered 2016-10-05: 01:00:00 via INTRAVENOUS

## 2016-10-04 MED ORDER — ASPIRIN EC 81 MG PO TBEC
81.0000 mg | DELAYED_RELEASE_TABLET | Freq: Every day | ORAL | Status: DC
  Administered 2016-10-05 – 2016-10-09 (×5): 81 mg via ORAL
  Filled 2016-10-04 (×5): qty 1

## 2016-10-04 MED ORDER — ONDANSETRON HCL 4 MG PO TABS: 4.0000 mg | ORAL_TABLET | Freq: Four times a day (QID) | ORAL | Status: DC | PRN

## 2016-10-04 MED ORDER — HYDRALAZINE HCL 25 MG PO TABS
25.0000 mg | ORAL_TABLET | Freq: Two times a day (BID) | ORAL | Status: DC
  Administered 2016-10-05 – 2016-10-09 (×7): 25 mg via ORAL
  Filled 2016-10-04 (×9): qty 1

## 2016-10-04 MED ORDER — FEBUXOSTAT 40 MG PO TABS
40.0000 mg | ORAL_TABLET | Freq: Every day | ORAL | Status: DC
  Administered 2016-10-05 – 2016-10-09 (×5): 40 mg via ORAL
  Filled 2016-10-04 (×5): qty 1

## 2016-10-04 NOTE — ED Provider Notes (Signed)
[facility] DEPT Provider Note   CSN: [id number] Arrival date & time: 10/04/16  1609     History   Chief Complaint Chief Complaint  Patient presents with  . Abnormal Lab    HPI [name] is a 76 y.o. female.  HPI  Pt was seen at 1620. Per pt, c/o gradual onset and worsening of persistent generalized weakness/fatigue that began approximately 1 week ago. Has been associated with "dark stools" and "sleeping a lot."  Pt endorses taking dual antiplatelet therapy s/p "a small heart attack" at [facility] before her symptoms began. Pt was evaluated by her Cards MD today, had labs drawn, and was told to come to the ED "because of a low hemoglobin."  Denies CP/palpitations, no SOB/cough, no abd pain, no N/V/D, no back pain, no fevers, no syncope, no focal motor weakness.   Past Medical History:  Diagnosis Date  . Anemia   . Arthritis   . Chronic anemia   . Chronic combined systolic and diastolic CHF (congestive heart failure) ([place])    a. 2D echo 08/2016 at [facility]: EF 50-55% with inferior wall HK, impaired LV filling, fair study.  . CKD (chronic kidney disease), stage III   . Diabetes ([place])   . Gout   . HTN (hypertension)   . NSTEMI (non-ST elevated myocardial infarction) ([place])    a. Complex admission 08/2016 - with severe hyperglycemia, AKI on CKD, severe anemia down to Hgb 6.8, acute combined CHF, troponin of 8.5, cath deferred due to renal dysfunction.    Patient Active Problem List   Diagnosis Date Noted  . Renal insufficiency 02/25/2015  . Hip fracture ([place]) 02/05/2015  . DM (diabetes mellitus) ([place]) 02/05/2015  . HTN (hypertension) 02/05/2015  . Gout 02/05/2015  . Left humeral fracture 02/05/2015    Past Surgical History:  Procedure Laterality Date  . ORIF HUMERUS FRACTURE Left 02/07/2015   Procedure: OPEN REDUCTION INTERNAL FIXATION (ORIF) PROXIMAL HUMERUS FRACTURE;  Surgeon: Marybelle Killings, MD;  Location: [facility];  Service: Orthopedics;  Laterality: Left;  . TOTAL HIP  ARTHROPLASTY Left 02/07/2015   Procedure: TOTAL HIP ARTHROPLASTY ANTERIOR APPROACH ;  Surgeon: Marybelle Killings, MD;  Location: [facility];  Service: Orthopedics;  Laterality: Left;  . WRIST SURGERY Left     OB History    No data available       Home Medications    Prior to Admission medications   Medication Sig Start Date End Date Taking? Authorizing Provider  amLODipine (NORVASC) 10 MG tablet Take 10 mg by mouth daily. 07/30/16   [provider]  aspirin EC 81 MG tablet Take 81 mg by mouth daily. 09/21/16   [provider]  CALCIUM PO Take 1 tablet by mouth daily.    [provider]  clopidogrel (PLAVIX) 75 MG tablet Take 75 mg by mouth daily.    [provider]  docusate sodium (COLACE) 100 MG capsule Take 1 capsule (100 mg total) by mouth at bedtime. 02/10/15   Rai, Vernelle Emerald, MD  fenofibrate (TRICOR) 145 MG tablet Take 145 mg by mouth daily.    [provider]  furosemide (LASIX) 20 MG tablet Take 20 mg by mouth as needed. 09/20/16   [provider]  gabapentin (NEURONTIN) 300 MG capsule Take 1 capsule by mouth at bedtime. 01/04/15   [provider]  glipiZIDE (GLUCOTROL) 10 MG tablet Take 1 tablet by mouth 2 (two) times daily. 01/22/15   [provider]  levothyroxine (SYNTHROID, [place])  25 MCG tablet Take 25 mcg by mouth daily before breakfast.    [provider]  metoprolol tartrate (LOPRESSOR) 25 MG tablet Take 12.5 mg by mouth 2 (two) times daily.    [provider]  Multiple Vitamin (MULTIVITAMIN WITH MINERALS) TABS tablet Take 1 tablet by mouth daily.    [provider]  pantoprazole (PROTONIX) 40 MG tablet Take 40 mg by mouth daily.    [provider]  ULORIC 40 MG tablet Take 1 tablet by mouth daily. 12/21/14   [provider]    Family History Family History  Problem Relation Age of Onset  . Diabetes Mother   . CAD Neg Hx     Social History Social History    Substance Use Topics  . Smoking status: Former Research scientist (life sciences)  . Smokeless tobacco: Never Used  . Alcohol use No     Allergies   Patient has no known allergies.   Review of Systems Review of Systems ROS: Statement: All systems negative except as marked or noted in the HPI; Constitutional: Negative for fever and chills. +generalized weakness/fatigue.; ; Eyes: Negative for eye pain, redness and discharge. ; ; ENMT: Negative for ear pain, hoarseness, nasal congestion, sinus pressure and sore throat. ; ; Cardiovascular: Negative for chest pain, palpitations, diaphoresis, dyspnea and peripheral edema. ; ; Respiratory: Negative for cough, wheezing and stridor. ; ; Gastrointestinal: +"dark stools." Negative for nausea, vomiting, diarrhea, abdominal pain, blood in stool, hematemesis, jaundice and rectal bleeding. . ; ; Genitourinary: Negative for dysuria, flank pain and hematuria. ; ; Musculoskeletal: Negative for back pain and neck pain. Negative for swelling and trauma.; ; Skin: Negative for pruritus, rash, abrasions, blisters, bruising and skin lesion.; ; Neuro: Negative for headache, lightheadedness and neck stiffness. Negative for altered level of consciousness, altered mental status, extremity weakness, paresthesias, involuntary movement, seizure and syncope.       Physical Exam Updated Vital Signs BP (!) 132/52   Pulse 80   Temp 97.9 F (36.6 C) (Oral)   Resp 16   Ht 5\' 4"  (1.626 m)   Wt 186 lb (84.4 kg)   SpO2 100%   BMI 31.93 kg/m    Patient Vitals for the past 24 hrs:  BP Temp Temp src Pulse Resp SpO2 Height Weight  10/04/16 1700 (!) 125/51 - - 75 14 96 % - -  10/04/16 1620 (!) 132/52 97.9 F (36.6 C) Oral 80 16 100 % - -  10/04/16 1615 (!) 132/52 - - - - - 5\' 4"  (1.626 m) 186 lb (84.4 kg)     Physical Exam 1625: Physical examination:  Nursing notes reviewed; Vital signs and O2 SAT reviewed;  Constitutional: Well developed, Well nourished, Well hydrated, In no acute distress;  Head:  Normocephalic, atraumatic; Eyes: EOMI, PERRL, No scleral icterus. Conjunctiva pale.; ENMT: Mouth and pharynx normal, Mucous membranes moist; Neck: Supple, Full range of motion, No lymphadenopathy; Cardiovascular: Regular rate and rhythm, No gallop; Respiratory: Breath sounds clear & equal bilaterally, No wheezes.  Speaking full sentences with ease, Normal respiratory effort/excursion; Chest: Nontender, Movement normal; Abdomen: Soft, Nontender, Nondistended, Normal bowel sounds. Rectal exam performed w/permission of pt and ED RN chaperone present.  Anal tone normal.  Non-tender, soft black stool in rectal vault, heme positive.  No fissures, no external hemorrhoids, no palp masses.; Genitourinary: No CVA tenderness; Extremities: Pulses normal, No tenderness, No edema, No calf edema or asymmetry.; Neuro: AA&Ox3, Major CN grossly intact.  Speech clear. No gross focal motor or sensory  deficits in extremities.; Skin: Color pale, Warm, Dry.   ED Treatments / Results  Labs (all labs ordered are listed, but only abnormal results are displayed)   EKG  EKG Interpretation  Date/Time:  Thursday Oct 04 2016 16:29:49 EDT Ventricular Rate:  77 PR Interval:    QRS Duration: 84 QT Interval:  380 QTC Calculation: 430 R Axis:   26 Text Interpretation:  Sinus rhythm Abnormal R-wave progression, early transition Borderline T abnormalities, anterior leads No old tracing to compare Confirmed by [facility]  MD, Nunzio Cory [phone]) on 10/04/2016 4:38:56 PM       Radiology   Procedures Procedures (including critical care time)  Medications Ordered in ED Medications - No data to display   Initial Impression / Assessment and Plan / ED Course  I have reviewed the triage vital signs and the nursing notes.  Pertinent labs & imaging results that were available during my care of the patient were reviewed by me and considered in my medical decision making (see chart for details).  MDM Reviewed: previous chart,  nursing note and vitals Reviewed previous: labs and ECG Interpretation: labs and ECG Total time providing critical care: 30-74 minutes. This excludes time spent performing separately reportable procedures and services. Consults: admitting MD   CRITICAL CARE Performed by: Alfonzo Feller Total critical care time: 35 minutes Critical care time was exclusive of separately billable procedures and treating other patients. Critical care was necessary to treat or prevent imminent or life-threatening deterioration. Critical care was time spent personally by me on the following activities: development of treatment plan with patient and/or surrogate as well as nursing, discussions with consultants, evaluation of patient's response to treatment, examination of patient, obtaining history from patient or surrogate, ordering and performing treatments and interventions, ordering and review of laboratory studies, ordering and review of radiographic studies, pulse oximetry and re-evaluation of patient's condition.  Results for orders placed or performed during the hospital encounter of 61/44/31  Basic metabolic panel  Result Value Ref Range   Sodium 135 135 - 145 mmol/L   Potassium 4.7 3.5 - 5.1 mmol/L   Chloride 108 101 - 111 mmol/L   CO2 20 (L) 22 - 32 mmol/L   Glucose, Bld 314 (H) 65 - 99 mg/dL   BUN 68 (H) 6 - 20 mg/dL   Creatinine, Ser 3.28 (H) 0.44 - 1.00 mg/dL   Calcium 8.6 (L) 8.9 - 10.3 mg/dL   GFR calc non Af Amer 13 (L) >60 mL/min   GFR calc Af Amer 15 (L) >60 mL/min   Anion gap 7 5 - 15  CBC with Differential/Platelet  Result Value Ref Range   WBC 7.9 4.0 - 10.5 K/uL   RBC 1.66 (L) 3.87 - 5.11 MIL/uL   Hemoglobin 4.9 (LL) 12.0 - 15.0 g/dL   HCT 15.8 (L) 36.0 - 46.0 %   MCV 95.2 78.0 - 100.0 fL   MCH 29.5 26.0 - 34.0 pg   MCHC 31.0 30.0 - 36.0 g/dL   RDW 18.3 (H) 11.5 - 15.5 %   Platelets 423 (H) 150 - 400 K/uL   Neutrophils Relative % 73 %   Neutro Abs 5.7 1.7 - 7.7 K/uL    Lymphocytes Relative 20 %   Lymphs Abs 1.6 0.7 - 4.0 K/uL   Monocytes Relative 5 %   Monocytes Absolute 0.4 0.1 - 1.0 K/uL   Eosinophils Relative 2 %   Eosinophils Absolute 0.2 0.0 - 0.7 K/uL   Basophils Relative 0 %   Basophils  Absolute 0.0 0.0 - 0.1 K/uL   Results for orders placed or performed during the hospital encounter of 10/04/16  Troponin I  Result Value Ref Range   Troponin I <0.03 <0.03 ng/mL  CBC  Result Value Ref Range   WBC 8.0 4.0 - 10.5 K/uL   RBC 1.73 (L) 3.87 - 5.11 MIL/uL   Hemoglobin 5.0 (LL) 12.0 - 15.0 g/dL   HCT 16.4 (L) 36.0 - 46.0 %   MCV 94.8 78.0 - 100.0 fL   MCH 28.9 26.0 - 34.0 pg   MCHC 30.5 30.0 - 36.0 g/dL   RDW 18.2 (H) 11.5 - 15.5 %   Platelets 456 (H) 150 - 400 K/uL  CBG monitoring, ED  Result Value Ref Range   Glucose-Capillary 239 (H) 65 - 99 mg/dL   Comment 1 Notify RN   Type and screen [facility]  Result Value Ref Range   ABO/RH(D) B POS    Antibody Screen PENDING    Sample Expiration 10/07/2016     Results for ANALEYA, LUALLEN (MRN [id number]) as of 10/04/2016 16:49  Ref. Range 02/12/2015 01:00 02/23/2015 13:55 02/25/2015 17:17 02/28/2015 06:30 10/04/2016 15:25  Hemoglobin Latest Ref Range: 12.0 - 15.0 g/dL 8.0 (L) 10.4 (L) 9.5 (L) 9.3 (L) 4.9 (LL)  HCT Latest Ref Range: 36.0 - 46.0 % 25.0 (L) 34.0 (L) 30.6 (L) 30.1 (L) 15.8 (L)   Results for MINA, BABULA (MRN [id number]) as of 10/04/2016 16:49  Ref. Range 02/23/2015 13:55 02/25/2015 17:17 02/28/2015 06:30 10/04/2016 15:25  BUN Latest Ref Range: 6 - 20 mg/dL 40 (H) 39 (H) 32 (H) 68 (H)  Creatinine Latest Ref Range: 0.44 - 1.00 mg/dL 1.97 (H) 1.67 (H) 1.35 (H) 3.28 (H)    1640:  New AKI. H/H as above. Will transfuse PRBC's. Dx and testing d/w pt and family.  Questions answered.  Verb understanding, agreeable to admit.  T/C to GI Dr. Gala Romney, case discussed, including:  HPI, pertinent PM/SHx, VS/PE, dx testing, ED course and treatment:  Agreeable to consult, requests to admit to  [facility] service.  1700:  T/C to [facility] Dr. Lorin Mercy, case discussed, including:  HPI, pertinent PM/SHx, VS/PE, dx testing, ED course and treatment:  Agreeable to admit, requests to re-check H/H.    Final Clinical Impressions(s) / ED Diagnoses   Final diagnoses:  None    New Prescriptions New Prescriptions   No medications on file     Francine Graven, DO 10/07/16 1602

## 2016-10-04 NOTE — H&P (Signed)
History and Physical    [name]:[id number] DOB: July 10, 1940 DOA: 10/04/2016  PCP: Ricard Dillon, MD Consultants:  None other than with recent admission at [facility] Patient coming from: home - lives with friend; Donald Prose: friend, [phone] Dorie Rank here from [place] through at least the winter; moved to [place] because friends were here  Chief Complaint: abnormal lab  HPI: [name] is a 76 y.o. female with medical history significant of HTN, DM, prior anemia by labs 2016, CKD (prior baseline 2), recent complex admission for anemia (Hgb 9 in 2016), AKI on CKD, acute combined CHF, NSTEMI.  Patient was admitted to [facility] with a "mild heart attack" - thought she had food poisoning due to nausea (no vomiting) on 4/23.  Went to [facility], doctor thought maybe she had PNA and transferred her to [facility].    Per cardiology PA Dunn: Extensive records reviewed. Initially presented to ED at [facility] on 09/17/16 due to nausea (thought she had food poisoning), found to have marked hyperglycemia to 510 and also noted to have hypoxia to 80% RA requiring bipap, lactic acidosis, severe anemia with Hgb 6.8, low grade fever, AKI (on suspected CKD) with Cr 2.89. Patient reported she'd previously been told Cr was around 2. CXR was concerning for PNA, underlying vascular congestion and mild cardiomegaly. CT chest (not angio) showed small bilateral effusions, ground-glas sopacities, diffuse pulm edema with possible RLL PNA, diffuse coronary calcifications, scattered aortic calcifications. She was treated with IV fluids for concern for sepsis but also IV Lasix, 2 U PRBCs, antibiotics, protonix, and Tylenol and transferred to [place] available from ED visit showed troponins of 0.36, 0.38, lactate 3.9, glucose 402, BUN 56, Cr 2.89, calcium 8.4, TProt 6.7, albumin 3.7, AST 78, ALT 48, Na 136, K 4.7, CO2 20.9, INR 1.2, pBNP 13638, WBC 13, Hgb 6.8, Hct 21. Per [facility] records, troponin was  found to be 8.554 > 7.961 > 7.101. Her ECG had no ischemic changes. TTE showed EF 50-55% with apical LV inferior wall hypokinesis and impaired LV filling. Cardiology was consulted for newly diagnosed combined HF with exacerbation who felt exacerbation most likely induced by NSTEMI. Her antibiotics were discontinued as her presentation was felt c/w CHF rather than PNA. It sounds like she was treated medically with aspirin, Plavix, heparin gtt, and metoprolol. She has h/o statin intolerance so was restarted on home fenofibrate. Upon discharge her Cr was 2.77 with recommendation to consider outpatient ischemic evaluation upon stabilization of renal function. A1C 7.0, LDL 136, HDL 36, trig 310. TSH wnl. Her serum iron and % sat were low, ferritin high in the setting of acute illness. Anemia was felt secondary to chronic disease in setting of likely CKD, although could consider GIB with OP workup. Last AST 56, ALT 45, albumin 3.2, Hgb 9.1, plt 383.  The patient reports that they were unable to do cath due to acute on chronic renal dysfunction.  She did not have a stress test.  The plan at discharge was to contact a heart specialist and to obtain a PCP and decide where to go from there.  She went today to cardiology for a routine hospital follow-up visit.  Her friend reported black stool (they thought it was medication-related) with fatigue and weakness; Hgb was 4.9 and they were sent to the ER.    She was transfused with 1 unit at [facility] and was transfused another unit at [facility].  They did not further evaluate that.  She never had symptoms of heart attack.  On presentation, nausea without vomiting.  She was previously taking iron and so they didn't notice the dark stools.  Went off iron aboiut a month ago and stools cleared. Dark stools came back about 10- days ago and so her friend again thought it was medication-related.  +weak, dizzy.  Unable to walk across the room without help.  No further nausea.  Last c-scope  was 5-6 years ago and it was normal.  +SOB with ambulation - doesn't hit her on the way to the bathroom but it hits her on the way back.  Has chronic neuropathic foot pain.     ED Course: Symptomatic anemia, transfuse 2 units.  ?new AKI.  Patient d/w Dr. Gala Romney who will see the patient in the AM.  Review of Systems: As per HPI; otherwise review of systems reviewed and negative.   Ambulatory Status:  ambulates with a cane  Past Medical History:  Diagnosis Date  . Anemia   . Arthritis   . Chronic anemia   . Chronic combined systolic and diastolic CHF (congestive heart failure) ([place])    a. 2D echo 08/2016 at [facility]: EF 50-55% with inferior wall HK, impaired LV filling, fair study.  . CKD (chronic kidney disease), stage III   . Diabetes ([place])   . Gout   . HTN (hypertension)   . NSTEMI (non-ST elevated myocardial infarction) ([place])    a. Complex admission 08/2016 - with severe hyperglycemia, AKI on CKD, severe anemia down to Hgb 6.8, acute combined CHF, troponin of 8.5, cath deferred due to renal dysfunction.    Past Surgical History:  Procedure Laterality Date  . ORIF HUMERUS FRACTURE Left 02/07/2015   Procedure: OPEN REDUCTION INTERNAL FIXATION (ORIF) PROXIMAL HUMERUS FRACTURE;  Surgeon: Marybelle Killings, MD;  Location: [facility];  Service: Orthopedics;  Laterality: Left;  . TOTAL HIP ARTHROPLASTY Left 02/07/2015   Procedure: TOTAL HIP ARTHROPLASTY ANTERIOR APPROACH ;  Surgeon: Marybelle Killings, MD;  Location: [facility];  Service: Orthopedics;  Laterality: Left;  . WRIST SURGERY Left     Social History   Social History  . Marital status: Widowed    Spouse name: N/A  . Number of children: N/A  . Years of education: N/A   Occupational History  . retired    Social History Main Topics  . Smoking status: Never Smoker  . Smokeless tobacco: Never Used  . Alcohol use No  . Drug use: No  . Sexual activity: Not on file   Other Topics Concern  . Not on file   Social History Narrative  . No narrative  on file    Allergies  Allergen Reactions  . Insulin Glargine Swelling    "Makes me swell like a balloon all over", including face, but without any respiratory distress or rashes. Associated with weight gain.  . Statins Other (See Comments)    "I've tried them all; my muscle aches were so bad I couldn't walk".    Family History  Problem Relation Age of Onset  . Diabetes Mother   . CAD Neg Hx   . GI Bleed Neg Hx     Prior to Admission medications   Medication Sig Start Date End Date Taking? Authorizing Provider  exenatide (BYETTA) 10 MCG/0.04ML SOPN injection Inject 0.4 mLs into the skin daily. 07/31/16  Yes [provider]  felodipine (PLENDIL) 10 MG 24 hr tablet Take 1 tablet by mouth daily. 06/14/16 06/14/17 Yes [provider]  ferrous sulfate 325 (65 FE) MG tablet Take 1 tablet by mouth 2 (two) times daily. 01/04/16  Yes [provider]  amLODipine (NORVASC) 10 MG tablet Take 10 mg by mouth daily. 07/30/16   [provider]  aspirin EC 81 MG tablet Take 81 mg by mouth daily. 09/21/16   [provider]  CALCIUM PO Take 1 tablet by mouth daily.    [provider]  clopidogrel (PLAVIX) 75 MG tablet Take 75 mg by mouth daily.    [provider]  docusate sodium (COLACE) 100 MG capsule Take 1 capsule (100 mg total) by mouth at bedtime. 02/10/15   Rai, Vernelle Emerald, MD  fenofibrate (TRICOR) 145 MG tablet Take 145 mg by mouth daily.    [provider]  furosemide (LASIX) 20 MG tablet Take 20 mg by mouth as needed. 09/20/16   [provider]  gabapentin (NEURONTIN) 300 MG capsule Take 1 capsule by mouth at bedtime. 01/04/15   [provider]  glipiZIDE (GLUCOTROL) 10 MG tablet Take 1 tablet by mouth 2 (two) times daily. 01/22/15   [provider]  levothyroxine (SYNTHROID, LEVOTHROID) 25 MCG tablet Take 25 mcg by mouth daily before breakfast.    [provider]  metoprolol tartrate (LOPRESSOR) 25  MG tablet Take 12.5 mg by mouth 2 (two) times daily.    [provider]  Multiple Vitamin (MULTIVITAMIN WITH MINERALS) TABS tablet Take 1 tablet by mouth daily.    [provider]  pantoprazole (PROTONIX) 40 MG tablet Take 40 mg by mouth daily.    [provider]  ULORIC 40 MG tablet Take 1 tablet by mouth daily. 12/21/14   [provider]    Physical Exam: Vitals:   10/04/16 1830 10/04/16 1924 10/04/16 1942 10/04/16 2012  BP: (!) 129/49 (!) 122/46 (!) 123/45 (!) 142/97  Pulse: 76 74 76 73  Resp: 14 14 14 18   Temp:  98.3 F (36.8 C) 97.9 F (36.6 C) 97.9 F (36.6 C)  TempSrc:  Oral Oral   SpO2: 95% 97% 97% 97%  Weight:      Height:         General:  Appears calm and comfortable and is NAD; she is quite pale Eyes:  PERRL, EOMI, normal lids, iris ENT:  grossly normal hearing, lips & tongue, mmm Neck:  no LAD, masses or thyromegaly Cardiovascular: RRR, no m/r/g. No LE edema.  Respiratory:  CTA bilaterally, no w/r/r. Normal respiratory effort. Abdomen:  soft, ntnd, NABS Skin:  no rash or induration seen on limited exam Musculoskeletal:  grossly normal tone BUE/BLE, good ROM, no bony abnormality Psychiatric:  grossly normal mood and affect, speech fluent and appropriate, AOx3 Neurologic:  CN 2-12 grossly intact, moves all extremities in coordinated fashion, sensation intact  Labs on Admission: I have personally reviewed following labs and imaging studies  CBC:  Recent Labs Lab 10/04/16 1525 10/04/16 1714  WBC 7.9 8.0  NEUTROABS 5.7  --   HGB 4.9* 5.0*  HCT 15.8* 16.4*  MCV 95.2 94.8  PLT 423* 630*   Basic Metabolic Panel:  Recent Labs Lab 10/04/16 1525  NA 135  K 4.7  CL 108  CO2 20*  GLUCOSE 314*  BUN 68*  CREATININE 3.28*  CALCIUM 8.6*   GFR: Estimated Creatinine Clearance: 15.3 mL/min (A) (by C-G formula based on SCr of 3.28 mg/dL (H)). Liver Function Tests: No results for input(s): AST, ALT, ALKPHOS, BILITOT, PROT,  ALBUMIN in the last 168 hours. No  results for input(s): LIPASE, AMYLASE in the last 168 hours. No results for input(s): AMMONIA in the last 168 hours. Coagulation Profile: No results for input(s): INR, PROTIME in the last 168 hours. Cardiac Enzymes:  Recent Labs Lab 10/04/16 1640  TROPONINI <0.03   BNP (last 3 results) No results for input(s): PROBNP in the last 8760 hours. HbA1C: No results for input(s): HGBA1C in the last 72 hours. CBG:  Recent Labs Lab 10/04/16 1717  GLUCAP 239*   Lipid Profile: No results for input(s): CHOL, HDL, LDLCALC, TRIG, CHOLHDL, LDLDIRECT in the last 72 hours. Thyroid Function Tests: No results for input(s): TSH, T4TOTAL, FREET4, T3FREE, THYROIDAB in the last 72 hours. Anemia Panel: No results for input(s): VITAMINB12, FOLATE, FERRITIN, TIBC, IRON, RETICCTPCT in the last 72 hours. Urine analysis:    Component Value Date/Time   COLORURINE YELLOW 02/08/2015 1615   APPEARANCEUR CLOUDY (A) 02/08/2015 1615   LABSPEC 1.010 02/08/2015 1615   PHURINE 5.0 02/08/2015 1615   GLUCOSEU 100 (A) 02/08/2015 1615   HGBUR TRACE (A) 02/08/2015 1615   BILIRUBINUR NEGATIVE 02/08/2015 1615   KETONESUR NEGATIVE 02/08/2015 1615   PROTEINUR 30 (A) 02/08/2015 1615   UROBILINOGEN 0.2 02/08/2015 1615   NITRITE NEGATIVE 02/08/2015 1615   LEUKOCYTESUR SMALL (A) 02/08/2015 1615    Creatinine Clearance: Estimated Creatinine Clearance: 15.3 mL/min (A) (by C-G formula based on SCr of 3.28 mg/dL (H)).  Sepsis Labs: @LABRCNTIP (procalcitonin:4,lacticidven:4) )No results found for this or any previous visit (from the past 240 hour(s)).   Radiological Exams on Admission: No results found.  EKG: Independently reviewed.  NSR with rate 77; nonspecific ST changes with no evidence of acute ischemia    Assessment/Plan Principal Problem:   Symptomatic anemia Active Problems:   DM (diabetes mellitus) (HCC)   HTN (hypertension)   Acute kidney injury superimposed on CKD  (HCC)   History of non-ST elevation myocardial infarction (NSTEMI)   Hyperlipidemia   Symptomatic anemia -Patient is presenting with an incidental finding of anemia on cardiology labs, although she does report tarry stools and fatigue/SOB/dizziness -She was recently hospitalized for NSTEMI and was transfused with 2 units PRBC at that time but had stable anemia at the time of discharge -Hgb on 4/26 9.1 -Iron studies 4/24: Iron 24, Ferritin 279, % sat 7 -Today: Hgb 5.0, MCV 94.8, RDW 18.3 -Heme very positive per Dr. Thurnell Garbe (not currently resulted in labs) -MCV is 80-100, indicative of normocytic anemia -This generally occurs in chronic disease and acute blood loss anemia -She distinctly denies abdominal pain or TTP -She has been taking Indocin, which is being held -The patient is symptomatic and she is also at risk for increasing the burden on her heart with her hemoglobin this low.  This is particularly concerning given her NSTEMI on 4/24.   -Risks/benefits reviewed carefully and after thorough discussion, the patient is in agreement with admission for transfusion of 3 units PRBC and then further evaluation/treatment. -Based on her recent NSTEMI, she is likely too high-risk to undergo anesthesia at [facility] and would benefit from having her evaluation done at [facility]. -She will need GI consultation in the AM for probable EGD. -Since she is not actively bleeding, will not start Protonix drip but will give IV Protonix 40 mg BID. -Clear liquids tonight, NPO after midnight for likely procedure. -If bleeding source is not appreciated on EGD, she may also need colonoscopy.  h/o NSTEMI -Patient hospitalized at [facility] from 4/23-26 and troponin peaked at 8.554 on 4/24. -She was thought to have  an NSTEMI and was discharged on ASA/Plavix DAPT. -Given this recent incident, I am reluctant to hold DAPT unless truly necessary; Plavix would be the medication to hold if required. -Since she is receiving blood  overnight, it seems reasonable to continue DAPT for now. -She was unable to have catheterization performed during her prior hospitalization due to renal dysfunction (see below). -She did not have stress testing performed. -It is likely reasonable to continue this evaluation as an outpatient once her bleeding and renal issues are stabilized, assuming no further cardiac issues. -She was diagnosed with chronic combined CHF during her recent hospitalization but is currently not volume overloaded. -Troponin <0.03  AKI on CKD -BUN 68, Creatinine 3.28, GFR 13 -Creatinine 4/26 2.77 -Suspect that this is contributed to by ongoing acute blood loss anemia. -Will trend with blood and volume repletion. -She may need nephrology consultation if this does not show improvement. -Avoid nephrotoxic agents.  DM -Glucose 314, 239 -Hold Byetta, Glucotrol -Check A1c -Cover with SSI  HTN -Continue Norvasc for now but this may not be her best choice with CHF -Continue hydralazine and Lopressor -No ACE due to renal dysfunction  HLD -Cholesterol on 4/24: TC 228, LDL 136, HDL 36, TG 310 -Reports intolerance to all statins - "I've tried them all; my muscle aches were so bad I couldn't walk." -This is unfortunate given her LDL 136 and her recent h/o NSTEMI -Could try beyond red yeast rice and/or Zetia -Continue Tricor     DVT prophylaxis: SCDs Code Status:  Full - confirmed with patient/family Family Communication: Friend present throughout evaluation  Disposition Plan:  Home once clinically improved Consults called: GI in AM (called [facility] by telephone tonight) Admission status: Admit to [facility] - It is my clinical opinion that admission to [place] is reasonable and necessary because this patient will require at least 2 midnights in the hospital to treat this condition based on the medical complexity of the problems presented.  Given the aforementioned information, the predictability of an adverse outcome  is felt to be significant.    Karmen Bongo MD [facility]  If 7PM-7AM, please contact night-coverage [email or web address] Password [facility]  10/04/2016, 8:37 PM

## 2016-10-04 NOTE — ED Triage Notes (Signed)
Pt sent to ED from cardiac rehab today due to hemoglobin level of 4.9. Pt c/o black stool x several weeks, fatigue and sleepiness x 1 week.

## 2016-10-04 NOTE — ED Notes (Addendum)
CRITICAL VALUE ALERT  Critical value received:  Hgb 5.0, Hmt 16.4  Date of notification:  10/04/16  Time of notification:  8346  Critical value read back:Yes.    Nurse who received alert:  Norm Salt, RN  MD notified (1st page):  Dr. Thurnell Garbe  Time of first page:  1731  MD notified (2nd page):  Time of second page:  Responding MD:  Dr. Thurnell Garbe  Time MD responded:  [phone]

## 2016-10-04 NOTE — Progress Notes (Addendum)
New Admission Note:  Arrival Method: Stretcher via [facility] Mental Orientation: A&O x4 Telemetry: N/A Assessment: Completed Skin: Assessed with Dora, RN, check flowsheets IV: R FA, 1 unit of RBC infusing @ 75 Pain: 0/10 Tubes: N/A Safety Measures: Safety Fall Prevention Plan was given, discussed and signed by patient Admission: Completed [facility] [place] Orientation: Patient has been orientated to the room, unit and the staff. Family: None at bedside.  Orders have been reviewed and implemented. Will continue to monitor the patient. Call light has been placed within reach and bed alarm has been activated.   Nena Polio BSN, RN

## 2016-10-04 NOTE — Patient Instructions (Addendum)
Your physician recommends that you schedule a follow-up appointment in: [place] has recommended you make the following change in your medication:  HOLD Aspirin and Plavix   STOP Taking Hydralazine   You have been referred to GI   You have been referred to PCP   Your physician recommends that you return for lab work in: Today   If you need a refill on your cardiac medications before your next appointment, please call your pharmacy.  Thank you for choosing [facility]!

## 2016-10-04 NOTE — Progress Notes (Signed)
Patient's Bp 135/44, pulse 83. Patient has Metoprolol and Hydralazine ordered. RN notified on call MD, Olevia Bowens.   MD advised to hold medications for tonight and to monitor BP.  RN will continue to monitor patient.  Ermalinda Memos, RN

## 2016-10-04 NOTE — Progress Notes (Signed)
Cardiology Office Note    Date:  10/04/2016  ID:  [name], DOB 11-19-1940, MRN [id number] PCP:  None per patient Cardiologist:  New, reviewed with Dr. Domenic Polite   Chief Complaint: shortness of breath, follow up MI  History of Present Illness:  [name] is a 76 y.o. female with history of HTN, DM, prior anemia by labs 2016, CKD (prior baseline 2), recent complex admission for anemia (Hgb 9 in 2016), AKI on CKD, acute combined CHF, NSTEMI who is referred from [facility] for evaluation of shortness of breath.  Extensive records reviewed. Initially presented to ED at [facility] on 09/17/16 due to nausea (thought she had food poisoning), found to have marked hyperglycemia to 510 and also noted to have hypoxia to 80% RA requiring bipap, lactic acidosis, severe anemia with Hgb 6.8, low grade fever, AKI (on suspected CKD) with Cr 2.89. Patient reported she'd previously been told Cr was around 2. CXR was concerning for PNA, underlying vascular congestion and mild cardiomegaly. CT chest (not angio) showed small bilateral effusions, ground-glas sopacities, diffuse pulm edema with possible RLL PNA, diffuse coronary calcifications, scattered aortic calcifications. She was treated with IV fluids for concern for sepsis but also IV Lasix, 2 U PRBCs, antibiotics, protonix, and Tylenol and transferred to [place] available from ED visit showed troponins of 0.36, 0.38, lactate 3.9, glucose 402, BUN 56, Cr 2.89, calcium 8.4, TProt 6.7, albumin 3.7, AST 78, ALT 48, Na 136, K 4.7, CO2 20.9, INR 1.2, pBNP 13638, WBC 13, Hgb 6.8, Hct 21.  Per [facility] records, troponin was found to be 8.554 > 7.961 > 7.101. Her ECG had no ischemic changes. TTE showed EF 50-55% with apical LV inferior wall hypokinesis and impaired LV filling. Cardiology was consulted for newly diagnosed combined HF with exacerbation who felt exacerbation most likely induced by NSTEMI. Her antibiotics were discontinued as  her presentation was felt c/w CHF rather than PNA. It sounds like she was treated medically with aspirin, Plavix, heparin gtt, and metoprolol. She has h/o statin intolerance so was restarted on home fenofibrate. Upon discharge her Cr was 2.77 with recommendation to consider outpatient ischemic evaluation upon stabilization of renal function. A1C 7.0, LDL 136, HDL 36, trig 310. TSH wnl. Her serum iron and % sat were low, ferritin high in the setting of acute illness. Anemia was felt secondary to chronic disease in setting of likely CKD, although could consider GIB with OP workup. Last AST 56, ALT 45, albumin 3.2, Hgb 9.1, plt 383.  She presents today for new patient evaluation. She is accompanied by her partner [name] who looks after her. Since discharge she has felt generally weak, fatigued and finds herself sleeping a lot. [name] said on one occasion she told him she feels like she's going to pass out but she denies this. Whenever she goes to stand up she has to sit down due to shortness of breath. She feels cold even in 80 degree heat. She denies overt dizziness. She's not had any chest pain or syncope. She has had black stools daily since hospital discharge. She has not seen any other providers in the interim. [name] wondered if it was maybe a medication side effect but she is not on any iron (but is on dual antiplatelet therapy). She has not had any LEE, orthopnea or weight gain and has not had to take her Lasix recently.    Past Medical History:  Diagnosis Date  . Anemia   .  Arthritis   . Chronic anemia   . Chronic combined systolic and diastolic CHF (congestive heart failure) ([place])    a. 2D echo 08/2016 at [facility]: EF 50-55% with inferior wall HK, impaired LV filling, fair study.  . CKD (chronic kidney disease), stage III   . Diabetes ([place])   . Gout   . HTN (hypertension)   . NSTEMI (non-ST elevated myocardial infarction) ([place])    a. Complex admission 08/2016 - with severe hyperglycemia, AKI on CKD, severe  anemia down to Hgb 6.8, acute combined CHF, troponin of 8.5, cath deferred due to renal dysfunction.    Past Surgical History:  Procedure Laterality Date  . ORIF HUMERUS FRACTURE Left 02/07/2015   Procedure: OPEN REDUCTION INTERNAL FIXATION (ORIF) PROXIMAL HUMERUS FRACTURE;  Surgeon: Marybelle Killings, MD;  Location: [place];  Service: Orthopedics;  Laterality: Left;  . TOTAL HIP ARTHROPLASTY Left 02/07/2015   Procedure: TOTAL HIP ARTHROPLASTY ANTERIOR APPROACH ;  Surgeon: Marybelle Killings, MD;  Location: [place];  Service: Orthopedics;  Laterality: Left;    Current Medications: Current Outpatient Prescriptions  Medication Sig Dispense Refill  . amLODipine (NORVASC) 10 MG tablet Take 10 mg by mouth daily.    Marland Kitchen aspirin EC 81 MG tablet Take 81 mg by mouth daily.    Marland Kitchen CALCIUM PO Take 1 tablet by mouth daily.    . clopidogrel (PLAVIX) 75 MG tablet Take 75 mg by mouth daily.    Marland Kitchen docusate sodium (COLACE) 100 MG capsule Take 1 capsule (100 mg total) by mouth at bedtime. 10 capsule 0  . fenofibrate (TRICOR) 145 MG tablet Take 145 mg by mouth daily.    . furosemide (LASIX) 20 MG tablet Take 20 mg by mouth as needed.    . gabapentin (NEURONTIN) 300 MG capsule Take 1 capsule by mouth at bedtime.    Marland Kitchen glipiZIDE (GLUCOTROL) 10 MG tablet Take 1 tablet by mouth 2 (two) times daily.    Marland Kitchen levothyroxine (SYNTHROID, LEVOTHROID) 25 MCG tablet Take 25 mcg by mouth daily before breakfast.    . metoprolol tartrate (LOPRESSOR) 25 MG tablet Take 12.5 mg by mouth 2 (two) times daily.    . Multiple Vitamin (MULTIVITAMIN WITH MINERALS) TABS tablet Take 1 tablet by mouth daily.    . pantoprazole (PROTONIX) 40 MG tablet Take 40 mg by mouth daily.    Marland Kitchen ULORIC 40 MG tablet Take 1 tablet by mouth daily.     No current facility-administered medications for this visit.      Allergies:   Patient has no known allergies.   Social History   Social History  . Marital status: Widowed    Spouse name: N/A  . Number of children: N/A    . Years of education: N/A   Social History Main Topics  . Smoking status: Former Research scientist (life sciences)  . Smokeless tobacco: Never Used  . Alcohol use Not on file  . Drug use: Unknown  . Sexual activity: Not on file   Other Topics Concern  . Not on file   Social History Narrative  . No narrative on file     Family History:  Family History  Problem Relation Age of Onset  . Diabetes Mother   . CAD Neg Hx     ROS:   Please see the history of present illness. + chronic peripheral neuropathy of feet. All other systems are reviewed and otherwise negative.    PHYSICAL EXAM:   VS:  BP (!) 110/50   Pulse 77  Ht 5\' 4"  (1.626 m)   Wt 186 lb (84.4 kg)   SpO2 95%   BMI 31.93 kg/m   BMI: Body mass index is 31.93 kg/m. GEN: Well nourished, well developed pale WF, in no acute distress  HEENT: normocephalic, atraumatic Neck: no JVD or masses - bilateral carotid bruits L>R Cardiac: RRR; no murmurs, rubs, or gallops, no edema  Respiratory:  clear to auscultation bilaterally, normal work of breathing GI: soft, nontender, nondistended, + BS MS: no deformity or atrophy  Skin: warm and dry, no rash Neuro:  Alert and Oriented x 3, Strength and sensation are intact, follows commands Psych: euthymic mood, full affect  Wt Readings from Last 3 Encounters:  10/04/16 186 lb (84.4 kg)  02/05/15 180 lb (81.6 kg)      Studies/Labs Reviewed:   EKG:  EKG was ordered today and personally reviewed by me and demonstrates NSR 73bpm, nonspecific ST-T Changes with subtle TW sagging in I, II, avF, V4-V6 with TWI III, V3-V6  Recent Labs: No results found for requested labs within last 8760 hours.   Lipid Panel No results found for: CHOL, TRIG, HDL, CHOLHDL, VLDL, LDLCALC, LDLDIRECT  Additional studies/ records that were reviewed today include: Summarized above., also:  2D Echo 09/18/16  PROCEDURE Study Quality: Fair. - SUMMARY The left ventricular size is normal. The left ventricle is not well  visualized. LV ejection fraction = 50-55%.  Regional wall motion abnormalities as specified below. There is aortic valve sclerosis. There is no aortic regurgitation. The mitral valve is normal in structure and function. There is trace mitral regurgitation. The tricuspid valve is not well visualized. There is trace tricuspid regurgitation. There is no pericardial effusion. There is no comparison study available. - FINDINGS:  LEFT VENTRICLE The left ventricular size is normal. The left ventricle is not well  visualized. LV ejection fraction = 50-55%. Left ventricular filling pattern  is impaired relaxation. There are regional wall motion abnormalities as  specified below. There is mid LV anterior septal mild hypokinesis. There is  apical LV inferior wall hypokinesis. There is apical LV wall hypokinesis. -  LEFT ATRIUM The left atrial size is normal.  RIGHT ATRIUM  Right atrial size is normal. - AORTIC VALVE There is aortic valve sclerosis. There is no aortic regurgitation. - MITRAL VALVE The mitral valve is normal in structure and function. There is trace mitral  regurgitation. - TRICUSPID VALVE The tricuspid valve is not well visualized. There is trace tricuspid  regurgitation. - PULMONIC VALVE The pulmonic valve is not well visualized. - ARTERIES The aortic root is normal size. - VENOUS Pulmonary venous flow pattern not well visualized. IVC size was mildly  dilated. - EFFUSION There is no pericardial effusion. - - MMode/2D Measurements & Calculations IVSd: 1.2 cm LVIDd: 4.4 cm LVPWd: 1.2 cm LVIDs: 2.8 cm LA dim: 3.5 cm Ao root: 2.5 cm EDV(MOD-sp4): 69.5 ml ESV(MOD-sp4): 27.1 ml LVOT diam: 1.8 cm LVOT area: 2.6 cm2 SV(MOD-sp4): 42.4 ml SI(MOD-sp4): 21.5 ml/m2 LA area A4: 24.2 cm2 RA area A4: 17.5 cm2 Doppler Measurements & Calculations MV E max vel: 122.7 cm/sec MV A max vel: 72.8 cm/sec MV E/A: 1.7 Med Peak E' Vel: 4.1 cm/sec Lat Peak E' Vel:  8.1 cm/sec E/Lat E`: 15.2 E/Med E`: 29.7 MV dec time: 0.13 sec SV(LVOT): 41.8 ml Ao V2 max: 129.6 cm/sec Ao max PG: 6.7 mmHg Ao V2 mean: 96.8 cm/sec Ao mean PG: 3.9 mmHg Ao V2 VTI: 21.8 cm AVA (VTI): 1.9 cm2 LV V1 VTI:  16.0 cm AS Dimensionless Index (VTI): 0.73 AVAi(VTI) cm^2/m^2: 0.97 cm2 SV index(LVOT): 21.2 ml/m2     ASSESSMENT & PLAN:   This patient's case was discussed in depth with Dr. Domenic Polite. The plan below was formulated per our discussion.  1. Melena - the patient relays a history of black stool, fatigue, weakness and cold intolerance since discharge from [facility]. She was recently placed on aspirin and Plavix. Discharge hemoglobin was 9.1. She is not taking any iron so this is extremely concerning for ongoing occult GI bleeding. She was not evaluated by GI during recent admission for hemoglobin of 6.8. Reviewed in depth with MD. Plan to check stat hemoglobin level. If there has been further downtrend in her hemoglobin level, would recommend she proceed to ED for internal medicine admission and inpatient GI evaluation. If CBC is stable will hold aspirin and Plavix and refer for GI evaluation ASAP. ADDENDUM: STAT HEMOGLOBIN HAS RETURNED AT 4.9. Needs to go to ER emergently for further admission of probable ongoing GI bleeding. We had the patient wait in our waiting room pending labs and she will be escorted to the ED. I will notify the ED that she is coming. 2. NSTEMI - recent troponin was higher than one would expect for demand ischemia, suggesting underlying CAD. However, we need to ensure stability of anemia/possible GI bleeding issues and renal insufficiency before proceeding with ischemic evaluation as it would be inappropriate to commit her to uninterrupted DAPT until this is clarified. Continue beta blocker. She has not had any chest pain fortunately. Dyspnea and fatigue may be her anginal equivalent but her recent anemia could also be contributing. 3. Chronic combined CHF -  appears euvolemic. Given her weakness and low-normal blood pressure will stop hydralazine. 4. AKI on CKD - f/u BMET today. 5. Acute on chronic anemia - as above, f/u CBC stat. 6. Dyslipidemia - down the road will need to consider referral to lipid clinic to manage HLD in setting of statin intolerance. Zetia could be considered. 7. Carotid bruits - once GI bleeding status is clarified will need carotid duplex.  Disposition: F/u with cardiology tentatively in 1 month per d/w MD. Also needs ASAP referral for primary care to manage multiple other non-cardiac issues.   Medication Adjustments/Labs and Tests Ordered: Current medicines are reviewed at length with the patient today.  Concerns regarding medicines are outlined above. Medication changes, Labs and Tests ordered today are summarized above and listed in the Patient Instructions accessible in Encounters.   Signed, Charlie Pitter, PA-C  10/04/2016 2:59 PM    [facility] Location in [place], [place] [address] Ph: [phone]; Fax [phone]

## 2016-10-05 DIAGNOSIS — E785 Hyperlipidemia, unspecified: Secondary | ICD-10-CM

## 2016-10-05 DIAGNOSIS — I10 Essential (primary) hypertension: Secondary | ICD-10-CM

## 2016-10-05 DIAGNOSIS — N179 Acute kidney failure, unspecified: Secondary | ICD-10-CM

## 2016-10-05 DIAGNOSIS — N189 Chronic kidney disease, unspecified: Secondary | ICD-10-CM

## 2016-10-05 DIAGNOSIS — E118 Type 2 diabetes mellitus with unspecified complications: Secondary | ICD-10-CM

## 2016-10-05 DIAGNOSIS — I252 Old myocardial infarction: Secondary | ICD-10-CM

## 2016-10-05 LAB — CBC
HCT: 19.5 % — ABNORMAL LOW (ref 36.0–46.0)
Hemoglobin: 6.1 g/dL — CL (ref 12.0–15.0)
MCH: 27.7 pg (ref 26.0–34.0)
MCHC: 31.3 g/dL (ref 30.0–36.0)
MCV: 88.6 fL (ref 78.0–100.0)
PLATELETS: 310 10*3/uL (ref 150–400)
RBC: 2.2 MIL/uL — AB (ref 3.87–5.11)
RDW: 18.8 % — ABNORMAL HIGH (ref 11.5–15.5)
WBC: 7.7 10*3/uL (ref 4.0–10.5)

## 2016-10-05 LAB — BASIC METABOLIC PANEL
Anion gap: 8 (ref 5–15)
BUN: 57 mg/dL — AB (ref 6–20)
CALCIUM: 8.2 mg/dL — AB (ref 8.9–10.3)
CHLORIDE: 112 mmol/L — AB (ref 101–111)
CO2: 19 mmol/L — ABNORMAL LOW (ref 22–32)
CREATININE: 2.88 mg/dL — AB (ref 0.44–1.00)
GFR calc non Af Amer: 15 mL/min — ABNORMAL LOW (ref >60)
GFR, EST AFRICAN AMERICAN: 17 mL/min — AB (ref >60)
Glucose, Bld: 75 mg/dL (ref 65–99)
Potassium: 4.7 mmol/L (ref 3.5–5.1)
Sodium: 139 mmol/L (ref 135–145)

## 2016-10-05 LAB — GLUCOSE, CAPILLARY
GLUCOSE-CAPILLARY: 42 mg/dL — AB (ref 65–99)
GLUCOSE-CAPILLARY: 223 mg/dL — AB (ref 65–99)
GLUCOSE-CAPILLARY: 173 mg/dL — AB (ref 65–99)
Glucose-Capillary: 108 mg/dL — ABNORMAL HIGH (ref 65–99)
Glucose-Capillary: 165 mg/dL — ABNORMAL HIGH (ref 65–99)
Glucose-Capillary: 74 mg/dL (ref 65–99)
Glucose-Capillary: 90 mg/dL (ref 65–99)

## 2016-10-05 LAB — PREPARE RBC (CROSSMATCH)

## 2016-10-05 MED ORDER — SODIUM CHLORIDE 0.9 % IV SOLN
Freq: Once | INTRAVENOUS | Status: AC
  Administered 2016-10-05: 04:00:00 via INTRAVENOUS

## 2016-10-05 MED ORDER — DEXTROSE-NACL 5-0.9 % IV SOLN: INTRAVENOUS | Status: DC

## 2016-10-05 MED ORDER — SODIUM CHLORIDE 0.9 % IV SOLN
INTRAVENOUS | Status: AC
  Administered 2016-10-05: 19:00:00 via INTRAVENOUS

## 2016-10-05 MED ORDER — DEXTROSE 50 % IV SOLN
50.0000 mL | Freq: Once | INTRAVENOUS | Status: AC
  Administered 2016-10-05: 50 mL via INTRAVENOUS

## 2016-10-05 MED ORDER — DEXTROSE 50 % IV SOLN
INTRAVENOUS | Status: AC
  Filled 2016-10-05: qty 50

## 2016-10-05 MED ORDER — INSULIN ASPART 100 UNIT/ML ~~LOC~~ SOLN
0.0000 [IU] | Freq: Three times a day (TID) | SUBCUTANEOUS | Status: DC
  Administered 2016-10-05: 3 [IU] via SUBCUTANEOUS
  Administered 2016-10-06 – 2016-10-07 (×2): 2 [IU] via SUBCUTANEOUS
  Administered 2016-10-08: 1 [IU] via SUBCUTANEOUS
  Administered 2016-10-09: 2 [IU] via SUBCUTANEOUS
  Administered 2016-10-09: 3 [IU] via SUBCUTANEOUS

## 2016-10-05 NOTE — Progress Notes (Signed)
0420 Patient blood sugar down 42 this morning.Patient asymptomatic.0434 hypoglycemia protocol initiated and 1 amp D50 given.0549 blood sugar up 165. Text paged Dr. Olevia Bowens . Will continue to monitor patient.

## 2016-10-05 NOTE — Progress Notes (Signed)
CRITICAL VALUE ALERT  Critical value received:  Hemoglobin 6.1  Date of notification:  10/05/2016  Time of notification:  0218  Critical value read back:yes  Nurse who received alert:  Kathyrn Drown RN  MD notified (1st page):  Dr. Olevia Bowens  Time of first page:  0320  MD notified (2nd page):  Time of second page:  Responding MD: Dr. Olevia Bowens  Time MD responded:  [phone]

## 2016-10-05 NOTE — Progress Notes (Signed)
Since patient came from another hospital blood bank can't dispense blood without new type and screen order.Text paged Dr. Olevia Bowens . Received new order for type and screen.

## 2016-10-05 NOTE — Progress Notes (Signed)
PROGRESS NOTE    [name]  PZW:[id number]  DOB: March 12, 1941  DOA: 10/04/2016 PCP: Ricard Dillon, MD Outpatient Specialists:   Hospital course: 76 y.o. female with medical history significant of HTN, DM, prior anemia by labs 2016, CKD (prior baseline 2), recent complex admission for anemia (Hgb 9 in 2016), AKI on CKD, acute combined CHF, NSTEM admitted with symptomatic anemia (Hg 4.9) and GI bleeding.    Assessment & Plan:    Symptomatic anemia - Pt feels much better after 3 units PRBCs.  GI was consulted and planning for EGD in AM.    CAD s/p recent NSTEMI - per GI hold plavix for now.  Continue aspirin.  Protonix BID for GI protection.   AKI on CKD - Improved slightly with hydration.  Avoiding nephrotoxins.   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    Hypertension - stable, resume home amlodipine, hydralazine and lopressor.   Hyperlipidemia- Statin intolerant, continue tricor, follow up with her cardiologist.   DVT prophylaxis: SCD Code Status: full  Family Communication: bedside Disposition Plan: Home when stable   Consultants:  [facility]  Procedures:  EGD pending  Subjective: Pt says she feels so much better after blood transfusion.    Objective: Vitals:   10/05/16 0500 10/05/16 0512 10/05/16 0814 10/05/16 1236  BP: (!) 105/29 (!) 111/37 (!) 109/36 (!) 138/51  Pulse: 74 76 70 72  Resp: 18 18 18 17   Temp: 98.4 F (36.9 C) 98.7 F (37.1 C) 99 F (37.2 C) 97.5 F (36.4 C)  TempSrc: Oral Oral Oral Oral  SpO2: 95% 95%  97%  Weight:      Height:        Intake/Output Summary (Last 24 hours) at 10/05/16 1258 Last data filed at 10/05/16 1010  Gross per 24 hour  Intake             2624 ml  Output             1075 ml  Net             1549 ml   Filed Weights   10/04/16 1615  Weight: 84.4 kg (186 lb)    Exam:  General exam: awake, sitting up in chair.  Respiratory system: Clear. No increased work of  breathing. Cardiovascular system: S1 & S2 heard, RRR. No JVD, murmurs, gallops, clicks or pedal edema. Gastrointestinal system: Abdomen is nondistended, soft and nontender. Normal bowel sounds heard. Central nervous system: Alert and oriented. No focal neurological deficits. Extremities: no CCE.  Data Reviewed: Basic Metabolic Panel:  Recent Labs Lab 10/04/16 1525 10/05/16 0218  NA 135 139  K 4.7 4.7  CL 108 112*  CO2 20* 19*  GLUCOSE 314* 75  BUN 68* 57*  CREATININE 3.28* 2.88*  CALCIUM 8.6* 8.2*   Liver Function Tests: No results for input(s): AST, ALT, ALKPHOS, BILITOT, PROT, ALBUMIN in the last 168 hours. No results for input(s): LIPASE, AMYLASE in the last 168 hours. No results for input(s): AMMONIA in the last 168 hours. CBC:  Recent Labs Lab 10/04/16 1525 10/04/16 1714 10/05/16 0218  WBC 7.9 8.0 7.7  NEUTROABS 5.7  --   --   HGB 4.9* 5.0* 6.1*  HCT 15.8* 16.4* 19.5*  MCV 95.2 94.8 88.6  PLT 423* 456* 310   Cardiac Enzymes:  Recent Labs Lab 10/04/16 1640  TROPONINI <0.03   CBG (last 3)   Recent Labs  10/05/16 0419 10/05/16 0457 10/05/16  0813  GLUCAP 42* 165* 74   No results found for this or any previous visit (from the past 240 hour(s)).   Studies: No results found.   Scheduled Meds: . amLODipine  10 mg Oral Daily  . aspirin EC  81 mg Oral Daily  . clopidogrel  75 mg Oral Daily  . febuxostat  40 mg Oral Daily  . fenofibrate  160 mg Oral Daily  . gabapentin  300 mg Oral BID  . hydrALAZINE  25 mg Oral BID  . levothyroxine  25 mcg Oral QAC breakfast  . metoprolol tartrate  12.5 mg Oral BID  . pantoprazole  40 mg Intravenous Q12H   Continuous Infusions: . sodium chloride    . dextrose 5 % and 0.9% NaCl      Principal Problem:   Symptomatic anemia Active Problems:   DM (diabetes mellitus) (HCC)   HTN (hypertension)   Acute kidney injury superimposed on CKD (HCC)   History of non-ST elevation myocardial infarction (NSTEMI)    Hyperlipidemia   Time spent:   Irwin Brakeman, MD, FAAFP [facility] Pager [phone]  If 7PM-7AM, please contact night-coverage [email or web address] Password [email or web address] 10/05/2016, 12:58 PM    LOS: 1 day

## 2016-10-05 NOTE — Progress Notes (Signed)
Nutrition Brief Note  Patient identified on the Malnutrition Screening Tool (MST) Report  Wt Readings from Last 15 Encounters:  10/04/16 186 lb (84.4 kg)  10/04/16 186 lb (84.4 kg)  02/05/15 180 lb (81.6 kg)   [name] is a 76 y.o. female with medical history significant of HTN, DM, prior anemia by labs 2016, CKD (prior baseline 2), recent complex admission for anemia (Hgb 9 in 2016), AKI on CKD, acute combined CHF, NSTEMI.  Patient was admitted to [facility] with a "mild heart attack" - thought she had food poisoning due to nausea (no vomiting) on 4/23.  Went to [facility], doctor thought maybe she had PNA and transferred her to [facility].    Pt sleeping soundly in recliner chair at time of visit. She was just advanced to a heart healthy diet. Reviewed wt hx; which reveals no weight loss.   Pt did not appear to have signs of fat or muscle depletion.   Body mass index is 31.93 kg/m. Patient meets criteria for obesity, class I based on current BMI.   Current diet order is Heart Healthy, patient is consuming approximately n/a% of meals at this time. Labs and medications reviewed.   No nutrition interventions warranted at this time. If nutrition issues arise, please consult RD.   Alexus Michael A. Jimmye Norman, RD, LDN, CDE Pager: [phone] After hours Pager: [phone]

## 2016-10-05 NOTE — Progress Notes (Addendum)
Patient has received total of 2 units of blood .Blood bank can't dispense third unit since order from [facility] had order to transfuse total 3 units from [facility] paged Dr. Olevia Bowens to receive new orders.Received new orders to transfuse 2 units of blood since hemoglobin 6.1 this morning for total 4 units blood.

## 2016-10-05 NOTE — Progress Notes (Signed)
Patient received 2 units of blood that is not documented in [facility], unit #[id number] was stared at [facility] on 09/04/16 and patient was in transport with care link during this transfusion. It was completed by care link at 2046. With no reaction. Second unit # [id number] initiated by care link at 2052.  Patient arrived to [address] with this unit infusing and  Blood was completed at  [facility] 10/05/16. Care link documention will be uploaded and accessible.. Chart review--->Media---->EMS run.

## 2016-10-05 NOTE — Consult Note (Signed)
[facility] Consult Note  Referring Provider: No ref. provider found Primary Care Physician:  Ricard Dillon, MD Primary Gastroenterologist:  Dr.  Laurel Dimmer Complaint: Black stools and weakness HPI: [name] is an 76 y.o. white female  admitted with a 10 day history of dark stools and hemoglobin of 4.9. She was recently admitted to [facility] with NSTEMI, AKI and congestive heart failure. She reportedly had drops in hemoglobin requiring transfusion but it is unclear whether she had evidence of GI bleeding. In any rate there was apparently no workup. She was treated with heparin and then switched over to Plavix which she was discharged on. On routine follow-up with her cardiologist yesterday she had a hemoglobin of 4.9. Her husband notes that she has had black stools for at least 10 days. He knows of no mention of GI bleeding when she was hospitalized. She has had 2 colonoscopies the last of which she thinks was in 2014 and has never had an endoscopy. She denies any abdominal pain. The patient was transfused 2 or 3 units packed red blood cells and feels much better with posttransfusion hemoglobin pending  Past Medical History:  Diagnosis Date  . Anemia   . Arthritis   . Chronic anemia   . Chronic combined systolic and diastolic CHF (congestive heart failure) ([place])    a. 2D echo 08/2016 at [facility]: EF 50-55% with inferior wall HK, impaired LV filling, fair study.  . CKD (chronic kidney disease), stage III   . Diabetes ([place])   . Gout   . HTN (hypertension)   . NSTEMI (non-ST elevated myocardial infarction) ([place])    a. Complex admission 08/2016 - with severe hyperglycemia, AKI on CKD, severe anemia down to Hgb 6.8, acute combined CHF, troponin of 8.5, cath deferred due to renal dysfunction.    Past Surgical History:  Procedure Laterality Date  . ORIF HUMERUS FRACTURE Left 02/07/2015   Procedure: OPEN REDUCTION INTERNAL FIXATION (ORIF) PROXIMAL HUMERUS FRACTURE;  Surgeon: Marybelle Killings, MD;  Location: [place];  Service: Orthopedics;  Laterality: Left;  . TOTAL HIP ARTHROPLASTY Left 02/07/2015   Procedure: TOTAL HIP ARTHROPLASTY ANTERIOR APPROACH ;  Surgeon: Marybelle Killings, MD;  Location: [place];  Service: Orthopedics;  Laterality: Left;  . WRIST SURGERY Left     Medications Prior to Admission  Medication Sig Dispense Refill  . amLODipine (NORVASC) 10 MG tablet Take 10 mg by mouth daily.    Marland Kitchen aspirin EC 81 MG tablet Take 81 mg by mouth daily.    . clopidogrel (PLAVIX) 75 MG tablet Take 75 mg by mouth daily.    Marland Kitchen exenatide (BYETTA) 10 MCG/0.04ML SOPN injection Inject 0.4 mLs into the skin daily.    . fenofibrate (TRICOR) 145 MG tablet Take 145 mg by mouth daily.    . furosemide (LASIX) 20 MG tablet Take 20 mg by mouth as needed. Only has to take it when she gains 2 lbs    . gabapentin (NEURONTIN) 300 MG capsule Take 1 capsule by mouth 2 (two) times daily.     Marland Kitchen glipiZIDE (GLUCOTROL) 10 MG tablet Take 1 tablet by mouth 2 (two) times daily.    . hydrALAZINE (APRESOLINE) 25 MG tablet Take 25 mg by mouth 2 (two) times daily.    . indomethacin (INDOCIN) 50 MG capsule Take 50 mg by mouth daily.    Marland Kitchen levothyroxine (SYNTHROID, LEVOTHROID) 25 MCG tablet Take 25 mcg by mouth daily before breakfast.    . metoprolol tartrate (  LOPRESSOR) 25 MG tablet Take 12.5 mg by mouth 2 (two) times daily.    . Multiple Vitamin (MULTIVITAMIN WITH MINERALS) TABS tablet Take 1 tablet by mouth daily.    . pantoprazole (PROTONIX) 40 MG tablet Take 40 mg by mouth daily.    Marland Kitchen ULORIC 40 MG tablet Take 1 tablet by mouth daily.      Allergies:  Allergies  Allergen Reactions  . Insulin Glargine Swelling    "Makes me swell like a balloon all over", including face, but without any respiratory distress or rashes. Associated with weight gain.  . Statins Other (See Comments)    "I've tried them all; my muscle aches were so bad I couldn't walk".    Family History  Problem Relation Age of Onset  . Diabetes  Mother   . CAD Neg Hx   . GI Bleed Neg Hx     Social History:  reports that she has never smoked. She has never used smokeless tobacco. She reports that she does not drink alcohol or use drugs.  Review of Systems: negative except As above   Blood pressure (!) 109/36, pulse 70, temperature 99 F (37.2 C), temperature source Oral, resp. rate 18, height '5\' 4"'  (1.626 m), weight 84.4 kg (186 lb), SpO2 95 %. Head: Normocephalic, without obvious abnormality, atraumatic Neck: no adenopathy, no carotid bruit, no JVD, supple, symmetrical, trachea midline and thyroid not enlarged, symmetric, no tenderness/mass/nodules Resp: clear to auscultation bilaterally Cardio: regular rate and rhythm, S1, S2 normal, no murmur, click, rub or gallop GI: Abdomen soft nondistended with normoactive bowel sounds. one or megaly masses or guarding Extremities: extremities normal, atraumatic, no cyanosis or edema  Results for orders placed or performed during the hospital encounter of 10/04/16 (from the past 48 hour(s))  ABO/Rh     Status: None   Collection Time: 10/04/16  4:34 PM  Result Value Ref Range   ABO/RH(D) B POS   Troponin I     Status: None   Collection Time: 10/04/16  4:40 PM  Result Value Ref Range   Troponin I <0.03 <0.03 ng/mL  Type and screen [facility]     Status: None (Preliminary result)   Collection Time: 10/04/16  4:40 PM  Result Value Ref Range   ABO/RH(D) B POS    Antibody Screen NEG    Sample Expiration 10/07/2016    Unit Number [id number]    Blood Component Type RED CELLS,LR    Unit division 00    Status of Unit ISSUED,FINAL    Transfusion Status OK TO TRANSFUSE    Crossmatch Result Compatible    Unit Number [id number]    Blood Component Type RBC LR PHER2    Unit division 00    Status of Unit ISSUED,FINAL    Transfusion Status OK TO TRANSFUSE    Crossmatch Result Compatible    Unit Number [id number]    Blood Component Type RBC LR PHER2    Unit division 00     Status of Unit ALLOCATED    Transfusion Status OK TO TRANSFUSE    Crossmatch Result Compatible    Unit Number [id number]    Blood Component Type RED CELLS,LR    Unit division 00    Status of Unit ALLOCATED    Transfusion Status OK TO TRANSFUSE    Crossmatch Result Compatible   Prepare RBC     Status: None   Collection Time: 10/04/16  4:58 PM  Result Value Ref Range   Order Confirmation ORDER  PROCESSED BY BLOOD BANK   CBC     Status: Abnormal   Collection Time: 10/04/16  5:14 PM  Result Value Ref Range   WBC 8.0 4.0 - 10.5 K/uL   RBC 1.73 (L) 3.87 - 5.11 MIL/uL   Hemoglobin 5.0 (LL) 12.0 - 15.0 g/dL    Comment: RESULT REPEATED AND VERIFIED CRITICAL RESULT CALLED TO, READ BACK BY AND VERIFIED WITH: WHITE,M AT 1730 ON 5.10.2018 BY ISLEY,B    HCT 16.4 (L) 36.0 - 46.0 %   MCV 94.8 78.0 - 100.0 fL   MCH 28.9 26.0 - 34.0 pg   MCHC 30.5 30.0 - 36.0 g/dL   RDW 18.2 (H) 11.5 - 15.5 %   Platelets 456 (H) 150 - 400 K/uL  CBG monitoring, ED     Status: Abnormal   Collection Time: 10/04/16  5:17 PM  Result Value Ref Range   Glucose-Capillary 239 (H) 65 - 99 mg/dL   Comment 1 Notify RN   Glucose, capillary     Status: Abnormal   Collection Time: 10/04/16  9:23 PM  Result Value Ref Range   Glucose-Capillary 130 (H) 65 - 99 mg/dL  Glucose, capillary     Status: Abnormal   Collection Time: 10/05/16 12:07 AM  Result Value Ref Range   Glucose-Capillary 108 (H) 65 - 99 mg/dL  Basic metabolic panel     Status: Abnormal   Collection Time: 10/05/16  2:18 AM  Result Value Ref Range   Sodium 139 135 - 145 mmol/L   Potassium 4.7 3.5 - 5.1 mmol/L   Chloride 112 (H) 101 - 111 mmol/L   CO2 19 (L) 22 - 32 mmol/L   Glucose, Bld 75 65 - 99 mg/dL   BUN 57 (H) 6 - 20 mg/dL   Creatinine, Ser 2.88 (H) 0.44 - 1.00 mg/dL   Calcium 8.2 (L) 8.9 - 10.3 mg/dL   GFR calc non Af Amer 15 (L) >60 mL/min   GFR calc Af Amer 17 (L) >60 mL/min    Comment: (NOTE) The eGFR has been calculated using the CKD  EPI equation. This calculation has not been validated in all clinical situations. eGFR's persistently <60 mL/min signify possible Chronic Kidney Disease.    Anion gap 8 5 - 15  CBC     Status: Abnormal   Collection Time: 10/05/16  2:18 AM  Result Value Ref Range   WBC 7.7 4.0 - 10.5 K/uL   RBC 2.20 (L) 3.87 - 5.11 MIL/uL   Hemoglobin 6.1 (LL) 12.0 - 15.0 g/dL    Comment: REPEATED TO VERIFY CRITICAL RESULT CALLED TO, READ BACK BY AND VERIFIED WITH: A.LOWE,RN 0302 10/05/16 M.CAMPBELL    HCT 19.5 (L) 36.0 - 46.0 %   MCV 88.6 78.0 - 100.0 fL   MCH 27.7 26.0 - 34.0 pg   MCHC 31.3 30.0 - 36.0 g/dL   RDW 18.8 (H) 11.5 - 15.5 %   Platelets 310 150 - 400 K/uL  Type and screen [place]     Status: None (Preliminary result)   Collection Time: 10/05/16  2:27 AM  Result Value Ref Range   ABO/RH(D) B POS    Antibody Screen NEG    Sample Expiration 10/08/2016    Unit Number [id number]    Blood Component Type RED CELLS,LR    Unit division 00    Status of Unit ISSUED    Transfusion Status OK TO TRANSFUSE    Crossmatch Result Compatible    Unit Number [id number]  Blood Component Type RED CELLS,LR    Unit division 00    Status of Unit ALLOCATED    Transfusion Status OK TO TRANSFUSE    Crossmatch Result Compatible   Prepare RBC     Status: None   Collection Time: 10/05/16  3:22 AM  Result Value Ref Range   Order Confirmation ORDER PROCESSED BY BLOOD BANK   Glucose, capillary     Status: Abnormal   Collection Time: 10/05/16  4:19 AM  Result Value Ref Range   Glucose-Capillary 42 (LL) 65 - 99 mg/dL  Glucose, capillary     Status: Abnormal   Collection Time: 10/05/16  4:57 AM  Result Value Ref Range   Glucose-Capillary 165 (H) 65 - 99 mg/dL  Glucose, capillary     Status: None   Collection Time: 10/05/16  8:13 AM  Result Value Ref Range   Glucose-Capillary 74 65 - 99 mg/dL   No results found.  Assessment: GI bleeding, subacute suspect upper source, a  patient on Plavix. Plan:  Protonix twice a day Patient transfused 3 units packed red blood cells, posttransfusion hemoglobin pending Hold Plavix EGD tomorrow morning Mikayela Deats C 10/05/2016, 12:08 PM  Pager [phone] If no answer or after 5 PM call [phone]

## 2016-10-06 ENCOUNTER — Encounter (HOSPITAL_COMMUNITY)
Admission: EM | Disposition: A | Discharge status: Home / Self Care | Payer: Self-pay | Source: Home / Self Care | Attending: Family Medicine

## 2016-10-06 ENCOUNTER — Encounter (HOSPITAL_COMMUNITY): Payer: Self-pay | Admitting: *Deleted

## 2016-10-06 HISTORY — PX: ESOPHAGOGASTRODUODENOSCOPY: SHX5428

## 2016-10-06 LAB — TYPE AND SCREEN
ABO/RH(D): B POS
Antibody Screen: NEGATIVE
Unit division: 0
Unit division: 0

## 2016-10-06 LAB — GLUCOSE, CAPILLARY
GLUCOSE-CAPILLARY: 114 mg/dL — AB (ref 65–99)
GLUCOSE-CAPILLARY: 151 mg/dL — AB (ref 65–99)
GLUCOSE-CAPILLARY: 166 mg/dL — AB (ref 65–99)
Glucose-Capillary: 116 mg/dL — ABNORMAL HIGH (ref 65–99)
Glucose-Capillary: 140 mg/dL — ABNORMAL HIGH (ref 65–99)

## 2016-10-06 LAB — RENAL FUNCTION PANEL
ALBUMIN: 2.9 g/dL — AB (ref 3.5–5.0)
ANION GAP: 8 (ref 5–15)
BUN: 50 mg/dL — ABNORMAL HIGH (ref 6–20)
CALCIUM: 8.6 mg/dL — AB (ref 8.9–10.3)
CO2: 19 mmol/L — AB (ref 22–32)
CREATININE: 2.81 mg/dL — AB (ref 0.44–1.00)
Chloride: 113 mmol/L — ABNORMAL HIGH (ref 101–111)
GFR, EST AFRICAN AMERICAN: 18 mL/min — AB (ref >60)
GFR, EST NON AFRICAN AMERICAN: 15 mL/min — AB (ref >60)
Glucose, Bld: 156 mg/dL — ABNORMAL HIGH (ref 65–99)
PHOSPHORUS: 4.1 mg/dL (ref 2.5–4.6)
Potassium: 5 mmol/L (ref 3.5–5.1)
SODIUM: 140 mmol/L (ref 135–145)

## 2016-10-06 LAB — BPAM RBC
BLOOD PRODUCT EXPIRATION DATE: 201806032359
Blood Product Expiration Date: 201806032359
ISSUE DATE / TIME: 201805110425
ISSUE DATE / TIME: 201805111456
UNIT TYPE AND RH: 7300
Unit Type and Rh: 7300

## 2016-10-06 LAB — CBC
HCT: 28.3 % — ABNORMAL LOW (ref 36.0–46.0)
HEMOGLOBIN: 8.7 g/dL — AB (ref 12.0–15.0)
MCH: 27.4 pg (ref 26.0–34.0)
MCHC: 30.7 g/dL (ref 30.0–36.0)
MCV: 89 fL (ref 78.0–100.0)
PLATELETS: 291 10*3/uL (ref 150–400)
RBC: 3.18 MIL/uL — ABNORMAL LOW (ref 3.87–5.11)
RDW: 17.3 % — AB (ref 11.5–15.5)
WBC: 7.6 10*3/uL (ref 4.0–10.5)

## 2016-10-06 SURGERY — EGD (ESOPHAGOGASTRODUODENOSCOPY)
Anesthesia: Moderate Sedation

## 2016-10-06 MED ORDER — SODIUM CHLORIDE 0.9 % IV SOLN: INTRAVENOUS | Status: DC

## 2016-10-06 MED ORDER — FENTANYL CITRATE (PF) 100 MCG/2ML IJ SOLN
INTRAMUSCULAR | Status: DC | PRN
  Administered 2016-10-06: 25 ug via INTRAVENOUS

## 2016-10-06 MED ORDER — BUTAMBEN-TETRACAINE-BENZOCAINE 2-2-14 % EX AERO
INHALATION_SPRAY | CUTANEOUS | Status: DC | PRN
Start: 2016-10-06 — End: 2016-10-06
  Administered 2016-10-06: 2 via TOPICAL

## 2016-10-06 MED ORDER — EPINEPHRINE PF 1 MG/10ML IJ SOSY
PREFILLED_SYRINGE | INTRAMUSCULAR | Status: AC
  Filled 2016-10-06: qty 10

## 2016-10-06 MED ORDER — SPOT INK MARKER SYRINGE KIT
PACK | SUBMUCOSAL | Status: AC
  Filled 2016-10-06: qty 5

## 2016-10-06 MED ORDER — MIDAZOLAM HCL 10 MG/2ML IJ SOLN
INTRAMUSCULAR | Status: DC | PRN
  Administered 2016-10-06: 2 mg via INTRAVENOUS
  Administered 2016-10-06: 1 mg via INTRAVENOUS

## 2016-10-06 MED ORDER — MIDAZOLAM HCL 5 MG/ML IJ SOLN
INTRAMUSCULAR | Status: AC
  Filled 2016-10-06: qty 2

## 2016-10-06 MED ORDER — FENTANYL CITRATE (PF) 100 MCG/2ML IJ SOLN
INTRAMUSCULAR | Status: AC
  Filled 2016-10-06: qty 2

## 2016-10-06 NOTE — Progress Notes (Addendum)
[facility] Progress Note  [name] 76 y.o. 11-21-1940  CC:  Melena.   Subjective: Patient denied any further episodes of bleeding. Status post 3 next of blood transfusion. No CBC this morning. Denied any abdominal pain, nausea and vomiting. Last use of Plavix was yesterday according to patient but she is not sure.  ROS : Negative for active chest pain or shortness of breath.   Objective: Vital signs in last 24 hours: Vitals:   10/05/16 2332 10/06/16 0528  BP: (!) 124/42 (!) 127/54  Pulse: 79 72  Resp:  18  Temp:  97.7 F (36.5 C)    Physical Exam:  General:  Alert, cooperative, Anxious no distress, appears stated age  Head:  Normocephalic, without obvious abnormality, atraumatic  Eyes:  , EOM's intact,   Lungs:   Clear to auscultation bilaterally, respirations unlabored  Heart:  Regular rate and rhythm, S1, S2 normal  Abdomen:   Soft, non-tender, bowel sounds active all four quadrants,  no masses,   Extremities: Extremities normal, atraumatic, no  edema  Pulses: 2+ and symmetric    Lab Results:  Recent Labs  10/04/16 1525 10/05/16 0218  NA 135 139  K 4.7 4.7  CL 108 112*  CO2 20* 19*  GLUCOSE 314* 75  BUN 68* 57*  CREATININE 3.28* 2.88*  CALCIUM 8.6* 8.2*   No results for input(s): AST, ALT, ALKPHOS, BILITOT, PROT, ALBUMIN in the last 72 hours.  Recent Labs  10/04/16 1525 10/04/16 1714 10/05/16 0218  WBC 7.9 8.0 7.7  NEUTROABS 5.7  --   --   HGB 4.9* 5.0* 6.1*  HCT 15.8* 16.4* 19.5*  MCV 95.2 94.8 88.6  PLT 423* 456* 310   No results for input(s): LABPROT, INR in the last 72 hours.    Assessment/Plan: - Melena while on Plavix. - Acute blood loss anemia. Last hemoglobin 6.1  - Recent NSTEMI around 10 days ago. Plavix on hold for procedure today. - Chronic kidney disease.  Recommendations ------------------------- - Stat repeat CBC. She may need blood transfusion if repeat hemoglobin is less than 8 - Continue PPI. Monitor  H&H. EGD today for further evaluation. Risk benefits and alternatives discussed with the patient. Verbalized understanding. Further plan based on endoscopic finding.  Otis Brace MD, [employer] 10/06/2016, 8:46 AM  Pager [phone]  If no answer or after 5 PM call [phone]

## 2016-10-06 NOTE — Op Note (Signed)
[facility] Patient Name: [name] Procedure Date : 10/06/2016 MRN: [id number] Attending MD: Otis Brace , MD Date of Birth: April 13, 1941 CSN: [id number] Age: 76 Admit Type: Inpatient Procedure:                Upper GI endoscopy Indications:              Melena Providers:                Otis Brace, MD, Kingsley Plan, RN, Corliss Parish, Technician Referring MD:              Medicines:                Midazolam 3 mg IV, Fentanyl 25 micrograms IV Complications:            No immediate complications. Estimated Blood Loss:     Estimated blood loss was minimal. Procedure:                Pre-Anesthesia Assessment:                           - Prior to the procedure, a History and Physical                            was performed, and patient medications and                            allergies were reviewed. The patient's tolerance of                            previous anesthesia was also reviewed. The risks                            and benefits of the procedure and the sedation                            options and risks were discussed with the patient.                            All questions were answered, and informed consent                            was obtained. Prior Anticoagulants: The patient has                            taken Plavix (clopidogrel), last dose was 1 day                            prior to procedure. ASA Grade Assessment: III - A                            patient with severe systemic disease. After  reviewing the risks and benefits, the patient was                            deemed in satisfactory condition to undergo the                            procedure.                           After obtaining informed consent, the endoscope was                            passed under direct vision. Throughout the                            procedure, the patient's blood pressure,  pulse, and                            oxygen saturations were monitored continuously. The                            EG-[id number] ([id number]) scope was introduced through the                            mouth, and advanced to the second part of duodenum.                            The upper GI endoscopy was accomplished without                            difficulty. The patient tolerated the procedure                            well. Scope In: Scope Out: Findings:      The Z-line was regular and was found 38 cm from the incisors.      There is no endoscopic evidence of inflammation, stricture, ulcerations       or varices in the entire esophagus.      Diffuse mild inflammation characterized by congestion (edema), erythema       and friability was found in the prepyloric region of the stomach.       Biopsies were taken with a cold forceps for histology.      The cardia and gastric fundus were normal on retroflexion.      The duodenal bulb, first portion of the duodenum and second portion of       the duodenum were normal. Impression:               - Z-line regular, 38 cm from the incisors.                           - Gastritis. Biopsied.                           - Normal duodenal bulb, first portion of the  duodenum and second portion of the duodenum. Moderate Sedation:      Moderate (conscious) sedation was administered by the endoscopy nurse       and supervised by the endoscopist. The following parameters were       monitored: oxygen saturation, heart rate, blood pressure, and response       to care. Recommendation:           - Return patient to hospital ward for ongoing care.                           - Resume previous diet.                           - Continue present medications.                           - Await pathology results. Procedure Code(s):        --- Professional ---                           [phone], Esophagogastroduodenoscopy, flexible,                             transoral; with biopsy, single or multiple Diagnosis Code(s):        --- Professional ---                           K29.70, Gastritis, unspecified, without bleeding                           K92.1, Melena (includes Hematochezia) CPT copyright 2016 American Medical Association. All rights reserved. The codes documented in this report are preliminary and upon coder review may  be revised to meet current compliance requirements. Otis Brace, MD Otis Brace, MD 10/06/2016 10:22:16 AM Number of Addenda: 0

## 2016-10-06 NOTE — Progress Notes (Deleted)
Patient keeps refusing labs, and keeps turning off his IV pump. Patient has been advised. Joslyn Hy, MSN, RN, [employer]

## 2016-10-06 NOTE — Brief Op Note (Signed)
10/04/2016 - 10/06/2016  10:25 AM  PATIENT:  [name]  76 y.o. female  PRE-OPERATIVE DIAGNOSIS:  Melena  POST-OPERATIVE DIAGNOSIS:  gastric biopsies for H. Pylori, gastritis  PROCEDURE:  Procedure(s): ESOPHAGOGASTRODUODENOSCOPY (EGD) (N/A)  SURGEON:  Surgeon(s) and Role:    * Niv Darley, MD - Primary  Findings/recommendations ------------------------------------- - No evidence of bleeding during upper endoscopy. - Full liquid diet today. Monitor H&H. - Given her significant drop in hemoglobin, will consider colonoscopy probably on Monday. - Continue holding Plavix is okay with primary team/cardiology in anticipation of possible colonoscopy on Monday. Diagnostic colonoscopy can be done while on Plavix if patient is at high risk for cardiovascular complication by holding Plavix.

## 2016-10-06 NOTE — Progress Notes (Signed)
PROGRESS NOTE   [name]  ENI:[id number]  DOB: 11-21-1940  DOA: 10/04/2016 PCP: Ricard Dillon, MD Outpatient Specialists:  Hospital course: 76 y.o. female with medical history significant of HTN, DM, prior anemia by labs 2016, CKD (prior baseline 2), recent complex admission for anemia (Hgb 9 in 2016), AKI on CKD, acute combined CHF, NSTEM admitted with symptomatic anemia (Hg 4.9) and GI bleeding.    Assessment & Plan:    Symptomatic anemia - Pt feels much better after 3 units PRBCs.  EGD shows gastritis.  Further eval per GI.  Holding plavix.     CAD s/p recent NSTEMI - per GI hold plavix for now.  Continue aspirin.  Protonix BID for GI protection.   AKI on CKD - Improved slightly with hydration.  Avoiding nephrotoxins.   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    Hypertension - stable, resume home amlodipine, hydralazine and lopressor.  Hyperlipidemia- Statin intolerant, continue tricor, follow up with her cardiologist.   DVT prophylaxis: SCD Code Status: full  Family Communication: friend at bedside Disposition Plan: Home when stable  Consultants:  [facility]  Procedures:  EGD 5/12  Subjective: Pt tolerated EGD well.    Objective: Vitals:   10/06/16 1000 10/06/16 1005 10/06/16 1010 10/06/16 1025  BP: (!) 139/56 (!) 150/69 (!) 142/71 (!) 148/66  Pulse:      Resp: 15 14 13 18   Temp:      TempSrc:      SpO2: 98% 99% 100% 100%  Weight:      Height:        Intake/Output Summary (Last 24 hours) at 10/06/16 1320 Last data filed at 10/06/16 6144  Gross per 24 hour  Intake              356 ml  Output              925 ml  Net             -569 ml   Filed Weights   10/04/16 1615  Weight: 84.4 kg (186 lb)    Exam:  General exam: awake, sitting up in chair.  Respiratory system: Clear. No increased work of breathing. Cardiovascular system: S1 & S2 heard, RRR. No JVD, murmurs, gallops, clicks or pedal  edema. Gastrointestinal system: Abdomen is nondistended, soft and nontender. Normal bowel sounds heard. Central nervous system: Alert and oriented. No focal neurological deficits. Extremities: no CCE.  Data Reviewed: Basic Metabolic Panel:  Recent Labs Lab 10/04/16 1525 10/05/16 0218 10/06/16 0834  NA 135 139 140  K 4.7 4.7 5.0  CL 108 112* 113*  CO2 20* 19* 19*  GLUCOSE 314* 75 156*  BUN 68* 57* 50*  CREATININE 3.28* 2.88* 2.81*  CALCIUM 8.6* 8.2* 8.6*  PHOS  --   --  4.1   Liver Function Tests:  Recent Labs Lab 10/06/16 0834  ALBUMIN 2.9*   No results for input(s): LIPASE, AMYLASE in the last 168 hours. No results for input(s): AMMONIA in the last 168 hours. CBC:  Recent Labs Lab 10/04/16 1525 10/04/16 1714 10/05/16 0218 10/06/16 0834  WBC 7.9 8.0 7.7 7.6  NEUTROABS 5.7  --   --   --   HGB 4.9* 5.0* 6.1* 8.7*  HCT 15.8* 16.4* 19.5* 28.3*  MCV 95.2 94.8 88.6 89.0  PLT 423* 456* 310 291   Cardiac Enzymes:  Recent Labs Lab 10/04/16 1640  TROPONINI <0.03   CBG (last 3)  Recent Labs  10/06/16 0525 10/06/16 0735 10/06/16 1224  GLUCAP 116* 140* 151*   No results found for this or any previous visit (from the past 240 hour(s)).   Studies: No results found.   Scheduled Meds: . amLODipine  10 mg Oral Daily  . aspirin EC  81 mg Oral Daily  . clopidogrel  75 mg Oral Daily  . febuxostat  40 mg Oral Daily  . fenofibrate  160 mg Oral Daily  . gabapentin  300 mg Oral BID  . hydrALAZINE  25 mg Oral BID  . insulin aspart  0-9 Units Subcutaneous TID WC  . levothyroxine  25 mcg Oral QAC breakfast  . metoprolol tartrate  12.5 mg Oral BID  . pantoprazole  40 mg Intravenous Q12H   Continuous Infusions: . sodium chloride     Principal Problem:   Symptomatic anemia Active Problems:   DM (diabetes mellitus) ([facility])   HTN (hypertension)   Acute kidney injury superimposed on CKD ([place])   History of non-ST elevation myocardial infarction (NSTEMI)    Hyperlipidemia  Time spent:   Irwin Brakeman, MD, FAAFP [facility] Pager [phone]  If 7PM-7AM, please contact night-coverage [email or web address] Password [email or web address] 10/06/2016, 1:20 PM    LOS: 2 days

## 2016-10-07 LAB — CBC
HEMATOCRIT: 27.4 % — AB (ref 36.0–46.0)
HEMOGLOBIN: 8.8 g/dL — AB (ref 12.0–15.0)
MCH: 29 pg (ref 26.0–34.0)
MCHC: 32.1 g/dL (ref 30.0–36.0)
MCV: 90.4 fL (ref 78.0–100.0)
Platelets: 280 10*3/uL (ref 150–400)
RBC: 3.03 MIL/uL — ABNORMAL LOW (ref 3.87–5.11)
RDW: 17.5 % — ABNORMAL HIGH (ref 11.5–15.5)
WBC: 6.2 10*3/uL (ref 4.0–10.5)

## 2016-10-07 LAB — GLUCOSE, CAPILLARY
GLUCOSE-CAPILLARY: 141 mg/dL — AB (ref 65–99)
GLUCOSE-CAPILLARY: 108 mg/dL — AB (ref 65–99)
GLUCOSE-CAPILLARY: 98 mg/dL (ref 65–99)
Glucose-Capillary: 82 mg/dL (ref 65–99)
Glucose-Capillary: 170 mg/dL — ABNORMAL HIGH (ref 65–99)
Glucose-Capillary: 112 mg/dL — ABNORMAL HIGH (ref 65–99)
Glucose-Capillary: 126 mg/dL — ABNORMAL HIGH (ref 65–99)

## 2016-10-07 LAB — HEMOGLOBIN A1C
Hgb A1c MFr Bld: 6 % — ABNORMAL HIGH (ref 4.8–5.6)
Mean Plasma Glucose: 126 mg/dL

## 2016-10-07 MED ORDER — SODIUM CHLORIDE 0.9 % IV SOLN
INTRAVENOUS | Status: DC
  Administered 2016-10-08: 11:00:00 via INTRAVENOUS

## 2016-10-07 MED ORDER — PEG 3350-KCL-NA BICARB-NACL 420 G PO SOLR
4000.0000 mL | Freq: Once | ORAL | Status: AC
  Administered 2016-10-07: 4000 mL via ORAL
  Filled 2016-10-07: qty 4000

## 2016-10-07 NOTE — Progress Notes (Signed)
PROGRESS NOTE   [name]  ZOX:[id number]  DOB: 1940/10/13  DOA: 10/04/2016 PCP: Ricard Dillon, MD Outpatient Specialists:  Hospital course: 76 y.o. female with medical history significant of HTN, DM, prior anemia by labs 2016, CKD (prior baseline 2), recent complex admission for anemia (Hgb 9 in 2016), AKI on CKD, acute combined CHF, NSTEM admitted with symptomatic anemia (Hg 4.9) and GI bleeding.    Assessment & Plan:    Symptomatic anemia - Pt feels much better after 3 units PRBCs.  EGD shows gastritis.  Further eval per GI with colonoscopy 5/14.  Holding plavix.     CAD s/p recent NSTEMI - per GI hold plavix for now.  Continue aspirin.  Protonix BID for GI protection.   AKI on CKD - Improved slightly with hydration.  Avoiding nephrotoxins.   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    Hypertension - stable, resume home amlodipine, hydralazine and lopressor.  Hyperlipidemia- Statin intolerant, continue tricor, follow up with her cardiologist.   DVT prophylaxis: SCD Code Status: full  Family Communication: friend at bedside Disposition Plan: Home possibly 5/14  Consultants:  [facility]  Procedures:  EGD 5/12  Subjective: Pt feeling better and ambulating in room, agreed to do the colonoscopy in AM  Objective: Vitals:   10/06/16 2232 10/06/16 2236 10/07/16 0459 10/07/16 0623  BP: (!) 133/55 (!) 133/55 (!) 114/38 (!) 101/41  Pulse: 76 76 68 66  Resp:  18 18 18   Temp:  98.3 F (36.8 C) 98 F (36.7 C) 98.3 F (36.8 C)  TempSrc:  Oral Oral Oral  SpO2:  100% 95% 95%  Weight:      Height:        Intake/Output Summary (Last 24 hours) at 10/07/16 1242 Last data filed at 10/07/16 0229  Gross per 24 hour  Intake                0 ml  Output              550 ml  Net             -550 ml   Filed Weights   10/04/16 1615  Weight: 84.4 kg (186 lb)    Exam:  General exam: awake, sitting up in chair.  Respiratory system: Clear. No  increased work of breathing. Cardiovascular system: S1 & S2 heard, RRR. No JVD, murmurs, gallops, clicks or pedal edema. Gastrointestinal system: Abdomen is nondistended, soft and nontender. Normal bowel sounds heard. Central nervous system: Alert and oriented. No focal neurological deficits. Extremities: no CCE.  Data Reviewed: Basic Metabolic Panel:  Recent Labs Lab 10/04/16 1525 10/05/16 0218 10/06/16 0834  NA 135 139 140  K 4.7 4.7 5.0  CL 108 112* 113*  CO2 20* 19* 19*  GLUCOSE 314* 75 156*  BUN 68* 57* 50*  CREATININE 3.28* 2.88* 2.81*  CALCIUM 8.6* 8.2* 8.6*  PHOS  --   --  4.1   Liver Function Tests:  Recent Labs Lab 10/06/16 0834  ALBUMIN 2.9*   No results for input(s): LIPASE, AMYLASE in the last 168 hours. No results for input(s): AMMONIA in the last 168 hours. CBC:  Recent Labs Lab 10/04/16 1525 10/04/16 1714 10/05/16 0218 10/06/16 0834 10/07/16 0848  WBC 7.9 8.0 7.7 7.6 6.2  NEUTROABS 5.7  --   --   --   --   HGB 4.9* 5.0* 6.1* 8.7* 8.8*  HCT 15.8* 16.4* 19.5* 28.3* 27.4*  MCV 95.2 94.8 88.6 89.0 90.4  PLT 423* 456* 310 291 280   Cardiac Enzymes:  Recent Labs Lab 10/04/16 1640  TROPONINI <0.03   CBG (last 3)   Recent Labs  10/07/16 0456 10/07/16 0815 10/07/16 1213  GLUCAP 108* 98 170*   No results found for this or any previous visit (from the past 240 hour(s)).   Studies: No results found.   Scheduled Meds: . amLODipine  10 mg Oral Daily  . aspirin EC  81 mg Oral Daily  . clopidogrel  75 mg Oral Daily  . febuxostat  40 mg Oral Daily  . fenofibrate  160 mg Oral Daily  . gabapentin  300 mg Oral BID  . hydrALAZINE  25 mg Oral BID  . insulin aspart  0-9 Units Subcutaneous TID WC  . levothyroxine  25 mcg Oral QAC breakfast  . metoprolol tartrate  12.5 mg Oral BID  . pantoprazole  40 mg Intravenous Q12H  . polyethylene glycol-electrolytes  4,000 mL Oral Once   Continuous Infusions: . sodium chloride    . sodium chloride 20  mL/hr at 10/07/16 1200   Principal Problem:   Symptomatic anemia Active Problems:   DM (diabetes mellitus) (HCC)   HTN (hypertension)   Acute kidney injury superimposed on CKD (HCC)   History of non-ST elevation myocardial infarction (NSTEMI)   Hyperlipidemia  Time spent:   Irwin Brakeman, MD, FAAFP [facility] Pager [phone]  If 7PM-7AM, please contact night-coverage [email or web address] Password [email or web address] 10/07/2016, 12:42 PM    LOS: 3 days

## 2016-10-07 NOTE — Progress Notes (Signed)
[facility] Gastroenterology Progress Note  [name] 76 y.o. 1940/09/15  CC:  Melena.   Subjective:  No further bleeding episodes. Feeling better after blood transfusion. Denied abdominal pain, nausea or vomiting.  ROS : Negative for active chest pain or shortness of breath.   Objective: Vital signs in last 24 hours: Vitals:   10/07/16 0459 10/07/16 0623  BP: (!) 114/38 (!) 101/41  Pulse: 68 66  Resp: 18 18  Temp: 98 F (36.7 C) 98.3 F (36.8 C)    Physical Exam:  General:  Alert, cooperative, Anxious no distress, appears stated age  Head:  Normocephalic, without obvious abnormality, atraumatic  Eyes:  , EOM's intact,   Lungs:   Clear to auscultation bilaterally, respirations unlabored  Heart:  Regular rate and rhythm, S1, S2 normal  Abdomen:   Soft, non-tender, bowel sounds active all four quadrants,  no masses,   Extremities: Extremities normal, atraumatic, trace edema       Lab Results:  Recent Labs  10/05/16 0218 10/06/16 0834  NA 139 140  K 4.7 5.0  CL 112* 113*  CO2 19* 19*  GLUCOSE 75 156*  BUN 57* 50*  CREATININE 2.88* 2.81*  CALCIUM 8.2* 8.6*  PHOS  --  4.1    Recent Labs  10/06/16 0834  ALBUMIN 2.9*    Recent Labs  10/04/16 1525  10/05/16 0218 10/06/16 0834  WBC 7.9  < > 7.7 7.6  NEUTROABS 5.7  --   --   --   HGB 4.9*  < > 6.1* 8.7*  HCT 15.8*  < > 19.5* 28.3*  MCV 95.2  < > 88.6 89.0  PLT 423*  < > 310 291  < > = values in this interval not displayed. No results for input(s): LABPROT, INR in the last 72 hours.    Assessment/Plan: - Melena while on Plavix.Negative EGD yesterday for any active bleeding. - Acute blood loss anemia. Hemoglobin was down to 4.9 on admission. Status post 5 units of blood transfusion. - Recent NSTEMI around 10 days ago. Plavix on hold since yesterday - Chronic kidney disease.  Recommendations ------------------------- - Patient with significant anemia hemoglobin down to 4.9, received 5 units of  blood transfusion so far. EGD negative for any active bleeding. Given significant drop in hemoglobin , I think we need to pursue colonoscopy to rule out any proximal colonic source of bleeding. Risks benefits alternatives were discussed with the patient. She verbalized understanding. She had  colonoscopy more than 5 years ago in [place]. Further plan based on colonoscopy findings.  Otis Brace MD, [place] 10/07/2016, 9:11 AM  Pager [phone]  If no answer or after 5 PM call [phone]

## 2016-10-08 ENCOUNTER — Inpatient Hospital Stay (HOSPITAL_COMMUNITY): Payer: Medicare PPO | Admitting: Certified Registered Nurse Anesthetist

## 2016-10-08 ENCOUNTER — Encounter (HOSPITAL_COMMUNITY): Payer: Self-pay | Admitting: *Deleted

## 2016-10-08 ENCOUNTER — Encounter (HOSPITAL_COMMUNITY)
Admission: EM | Disposition: A | Discharge status: Home / Self Care | Payer: Self-pay | Source: Home / Self Care | Attending: Family Medicine

## 2016-10-08 HISTORY — PX: GIVENS CAPSULE STUDY: SHX5432

## 2016-10-08 HISTORY — PX: COLONOSCOPY WITH PROPOFOL: SHX5780

## 2016-10-08 LAB — TYPE AND SCREEN
ABO/RH(D): B POS
ANTIBODY SCREEN: NEGATIVE
UNIT DIVISION: 0
Unit division: 0
Unit division: 0
Unit division: 0

## 2016-10-08 LAB — BPAM RBC
BLOOD PRODUCT EXPIRATION DATE: 201805282359
BLOOD PRODUCT EXPIRATION DATE: 201805292359
Blood Product Expiration Date: 201805242359
Blood Product Expiration Date: 201805242359
ISSUE DATE / TIME: 201805101915
ISSUE DATE / TIME: 201805102024
UNIT TYPE AND RH: 1700
UNIT TYPE AND RH: 5100
UNIT TYPE AND RH: 5100
Unit Type and Rh: 1700

## 2016-10-08 LAB — GLUCOSE, CAPILLARY
GLUCOSE-CAPILLARY: 117 mg/dL — AB (ref 65–99)
GLUCOSE-CAPILLARY: 127 mg/dL — AB (ref 65–99)
GLUCOSE-CAPILLARY: 91 mg/dL (ref 65–99)
GLUCOSE-CAPILLARY: 96 mg/dL (ref 65–99)
Glucose-Capillary: 104 mg/dL — ABNORMAL HIGH (ref 65–99)
Glucose-Capillary: 127 mg/dL — ABNORMAL HIGH (ref 65–99)
Glucose-Capillary: 182 mg/dL — ABNORMAL HIGH (ref 65–99)

## 2016-10-08 SURGERY — COLONOSCOPY WITH PROPOFOL
Anesthesia: Monitor Anesthesia Care

## 2016-10-08 MED ORDER — HYDROMORPHONE HCL 1 MG/ML IJ SOLN: 0.2500 mg | INTRAMUSCULAR | Status: DC | PRN

## 2016-10-08 MED ORDER — ONDANSETRON HCL 4 MG/2ML IJ SOLN: 4.0000 mg | Freq: Once | INTRAMUSCULAR | Status: DC | PRN

## 2016-10-08 MED ORDER — MEPERIDINE HCL 25 MG/ML IJ SOLN: 6.2500 mg | INTRAMUSCULAR | Status: DC | PRN

## 2016-10-08 MED ORDER — PROPOFOL 500 MG/50ML IV EMUL
INTRAVENOUS | Status: DC | PRN
  Administered 2016-10-08: 100 ug/kg/min via INTRAVENOUS

## 2016-10-08 NOTE — Op Note (Signed)
[facility] Patient Name: [name] Procedure Date : 10/08/2016 MRN: [id number] Attending MD: Ronnette Juniper , MD Date of Birth: [birth year] CSN: [id number] Age: 76 Admit Type: Inpatient Procedure:                Colonoscopy Indications:              Hematochezia, Melena, Acute post hemorrhagic                            anemia, unremarkable EGD on 10/06/16 Providers:                Ronnette Juniper, MD, Kingsley Plan, RN, Cletis Athens,                            Technician, Clearnce Sorrel, CRNA Referring MD:              Medicines:                Monitored Anesthesia Care Complications:            No immediate complications. Estimated Blood Loss:     Estimated blood loss: none. Procedure:                Pre-Anesthesia Assessment:                           - Prior to the procedure, a History and Physical                            was performed, and patient medications and                            allergies were reviewed. The patient's tolerance of                            previous anesthesia was also reviewed. The risks                            and benefits of the procedure and the sedation                            options and risks were discussed with the patient.                            All questions were answered, and informed consent                            was obtained. Prior Anticoagulants: The patient has                            taken aspirin, last dose was day of procedure. ASA                            Grade Assessment: III - A patient with severe  systemic disease. After reviewing the risks and                            benefits, the patient was deemed in satisfactory                            condition to undergo the procedure.                           - Prior to the procedure, a History and Physical                            was performed, and patient medications and                            allergies were reviewed.  The patient's tolerance of                            previous anesthesia was also reviewed. The risks                            and benefits of the procedure and the sedation                            options and risks were discussed with the patient.                            All questions were answered, and informed consent                            was obtained. Prior Anticoagulants: The patient has                            taken Plavix (clopidogrel), last dose was day of                            procedure. ASA Grade Assessment: III - A patient                            with severe systemic disease. After reviewing the                            risks and benefits, the patient was deemed in                            satisfactory condition to undergo the procedure.                           After obtaining informed consent, the colonoscope                            was passed under direct vision. Throughout the  procedure, the patient's blood pressure, pulse, and                            oxygen saturations were monitored continuously. The                            Colonoscope was introduced through the anus and                            advanced to the the cecum, identified by                            appendiceal orifice and ileocecal valve. The                            colonoscopy was performed without difficulty. The                            patient tolerated the procedure well. The quality                            of the bowel preparation was adequate to identify                            polyps 6 mm and larger in size. The ileocecal                            valve, appendiceal orifice, and rectum were                            photographed. Scope In: 11:01:57 AM Scope Out: 11:11:10 AM Scope Withdrawal Time: 0 hours 7 minutes 4 seconds  Total Procedure Duration: 0 hours 9 minutes 13 seconds  Findings:      The perianal and digital  rectal examinations were normal.      A 5 mm polyp was found in the transverse colon. The polyp was sessile.       Polypectomy was not attempted due to the patient taking anticoagulation       medication.      A few small-mouthed diverticula were found in the sigmoid colon.      Non-bleeding internal hemorrhoids were found during retroflexion. The       hemorrhoids were mild and medium-sized.      The exam was otherwise without abnormality. Impression:               - One 5 mm polyp in the transverse colon. Resection                            not attempted.                           - Diverticulosis in the sigmoid colon.                           - Non-bleeding internal hemorrhoids.                           -  The examination was otherwise normal.                           - No specimens collected. Moderate Sedation:      Moderate (conscious) sedation was personally administered by an       anesthesia professional. The following parameters were monitored: oxygen       saturation, heart rate, blood pressure, and response to care. Recommendation:           - Resume regular diet.                           - NPO after midnight for video capsule in am.                           - Continue present medications.                           - Repeat colonoscopy at appointment to be scheduled                            for removal of polyp as an outpatient when it is                            safe to hold plavix for 5 days. Procedure Code(s):        --- Professional ---                           [phone], Colonoscopy, flexible; diagnostic, including                            collection of specimen(s) by brushing or washing,                            when performed (separate procedure) Diagnosis Code(s):        --- Professional ---                           D12.3, Benign neoplasm of transverse colon (hepatic                            flexure or splenic flexure)                           K64.8,  Other hemorrhoids                           K92.1, Melena (includes Hematochezia)                           D62, Acute posthemorrhagic anemia                           K57.30, Diverticulosis of large intestine without                            perforation or abscess  without bleeding CPT copyright 2016 [employer] Association. All rights reserved. The codes documented in this report are preliminary and upon coder review may  be revised to meet current compliance requirements. Ronnette Juniper, MD 10/08/2016 11:18:08 AM This report has been signed electronically. Number of Addenda: 0

## 2016-10-08 NOTE — H&P (View-Only) (Signed)
[facility] Gastroenterology Progress Note  [name] 76 y.o. [birth year]  CC:  Melena.   Subjective: Patient denied any further episodes of bleeding. Status post 3 next of blood transfusion. No CBC this morning. Denied any abdominal pain, nausea and vomiting. Last use of Plavix was yesterday according to patient but she is not sure.  ROS : Negative for active chest pain or shortness of breath.   Objective: Vital signs in last 24 hours: Vitals:   10/05/16 2332 10/06/16 0528  BP: (!) 124/42 (!) 127/54  Pulse: 79 72  Resp:  18  Temp:  97.7 F (36.5 C)    Physical Exam:  General:  Alert, cooperative, Anxious no distress, appears stated age  Head:  Normocephalic, without obvious abnormality, atraumatic  Eyes:  , EOM's intact,   Lungs:   Clear to auscultation bilaterally, respirations unlabored  Heart:  Regular rate and rhythm, S1, S2 normal  Abdomen:   Soft, non-tender, bowel sounds active all four quadrants,  no masses,   Extremities: Extremities normal, atraumatic, no  edema  Pulses: 2+ and symmetric    Lab Results:  Recent Labs  10/04/16 1525 10/05/16 0218  NA 135 139  K 4.7 4.7  CL 108 112*  CO2 20* 19*  GLUCOSE 314* 75  BUN 68* 57*  CREATININE 3.28* 2.88*  CALCIUM 8.6* 8.2*   No results for input(s): AST, ALT, ALKPHOS, BILITOT, PROT, ALBUMIN in the last 72 hours.  Recent Labs  10/04/16 1525 10/04/16 1714 10/05/16 0218  WBC 7.9 8.0 7.7  NEUTROABS 5.7  --   --   HGB 4.9* 5.0* 6.1*  HCT 15.8* 16.4* 19.5*  MCV 95.2 94.8 88.6  PLT 423* 456* 310   No results for input(s): LABPROT, INR in the last 72 hours.    Assessment/Plan: - Melena while on Plavix. - Acute blood loss anemia. Last hemoglobin 6.1  - Recent NSTEMI around 10 days ago. Plavix on hold for procedure today. - Chronic kidney disease.  Recommendations ------------------------- - Stat repeat CBC. She may need blood transfusion if repeat hemoglobin is less than 8 - Continue PPI. Monitor  H&H. EGD today for further evaluation. Risk benefits and alternatives discussed with the patient. Verbalized understanding. Further plan based on endoscopic finding.  Otis Brace MD, [place] 10/06/2016, 8:46 AM  Pager [phone]  If no answer or after 5 PM call [phone]

## 2016-10-08 NOTE — Op Note (Signed)
Colonoscopy showed small few diverticulosis in sigmoid, a 5 mm polyp in transverse colon(not removed due to concurrent use of Plavix and aspirin) and internal hemorrhoids. As patient presented with severe anemia, clotting multiple units of transfusion and requires dual antiplatelet therapy for recent NSTEMI, unremarkable EGD and colonoscopy , will schedule for a video capsule endoscopy to be performed in a.m.  Gari Crown [phone]

## 2016-10-08 NOTE — Progress Notes (Signed)
PROGRESS NOTE   [name]  YCX:[id number]  DOB: 10/16/1940  DOA: 10/04/2016 PCP: Ricard Dillon, MD Outpatient Specialists:  Hospital course: 76 y.o. female with medical history significant of HTN, DM, prior anemia by labs 2016, CKD (prior baseline 2), recent complex admission for anemia (Hgb 9 in 2016), AKI on CKD, acute combined CHF, NSTEM admitted with symptomatic anemia (Hg 4.9) and GI bleeding.    Assessment & Plan:    Symptomatic anemia - Pt feels much better after 3 units PRBCs.  EGD shows gastritis.  Colonoscopy 5/14 showed small few diverticulosis in sigmoid, a 5 mm polyp in transverse colon(not removed due to concurrent use of Plavix and aspirin) and internal hemorrhoids.  Resumed plavix and aspirin.  NPO after midnight for capsule endoscopy study in AM.     CAD s/p recent NSTEMI - resumed plavix and continue aspirin.  Protonix BID for GI protection.   AKI on CKD - Improved slightly with hydration.  Avoiding nephrotoxins.   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    Hypertension - stable, resume home amlodipine, hydralazine and lopressor.  Hyperlipidemia- Statin intolerant, continue tricor, follow up with her cardiologist.   DVT prophylaxis: SCD Code Status: full  Family Communication: friend at bedside Disposition Plan: home   Consultants:  [facility]  Procedures:  EGD 5/12  Subjective: Pt feeling better and was seen prior to colonoscopy in AM.   Objective: Vitals:   10/08/16 1025 10/08/16 1120 10/08/16 1128 10/08/16 1210  BP: (!) 146/45 (!) 127/53 97/63   Pulse: 73 64 64   Resp: 14 15 18    Temp: 98.3 F (36.8 C) 97.8 F (36.6 C)    TempSrc: Oral Oral    SpO2: 97% 100% 100%   Weight:    83.9 kg (185 lb)  Height:    5\' 4"  (1.626 m)    Intake/Output Summary (Last 24 hours) at 10/08/16 1414 Last data filed at 10/08/16 1111  Gross per 24 hour  Intake              180 ml  Output                0 ml  Net               180 ml   Filed Weights   10/04/16 1615 10/08/16 1210  Weight: 84.4 kg (186 lb) 83.9 kg (185 lb)    Exam:  General exam: awake, sitting up in chair.  Respiratory system: Clear. No increased work of breathing. Cardiovascular system: S1 & S2 heard, RRR. No JVD, murmurs, gallops, clicks or pedal edema. Gastrointestinal system: Abdomen is nondistended, soft and nontender. Normal bowel sounds heard. Central nervous system: Alert and oriented. No focal neurological deficits. Extremities: no CCE.  Data Reviewed: Basic Metabolic Panel:  Recent Labs Lab 10/04/16 1525 10/05/16 0218 10/06/16 0834  NA 135 139 140  K 4.7 4.7 5.0  CL 108 112* 113*  CO2 20* 19* 19*  GLUCOSE 314* 75 156*  BUN 68* 57* 50*  CREATININE 3.28* 2.88* 2.81*  CALCIUM 8.6* 8.2* 8.6*  PHOS  --   --  4.1   Liver Function Tests:  Recent Labs Lab 10/06/16 0834  ALBUMIN 2.9*   No results for input(s): LIPASE, AMYLASE in the last 168 hours. No results for input(s): AMMONIA in the last 168 hours. CBC:  Recent Labs Lab 10/04/16 1525 10/04/16 1714 10/05/16 0218 10/06/16 0834 10/07/16 0848  WBC 7.9  8.0 7.7 7.6 6.2  NEUTROABS 5.7  --   --   --   --   HGB 4.9* 5.0* 6.1* 8.7* 8.8*  HCT 15.8* 16.4* 19.5* 28.3* 27.4*  MCV 95.2 94.8 88.6 89.0 90.4  PLT 423* 456* 310 291 280   Cardiac Enzymes:  Recent Labs Lab 10/04/16 1640  TROPONINI <0.03   CBG (last 3)   Recent Labs  10/08/16 0739 10/08/16 1033 10/08/16 1232  GLUCAP 104* 127* 127*   No results found for this or any previous visit (from the past 240 hour(s)).   Studies: No results found.   Scheduled Meds: . amLODipine  10 mg Oral Daily  . aspirin EC  81 mg Oral Daily  . clopidogrel  75 mg Oral Daily  . febuxostat  40 mg Oral Daily  . fenofibrate  160 mg Oral Daily  . gabapentin  300 mg Oral BID  . hydrALAZINE  25 mg Oral BID  . insulin aspart  0-9 Units Subcutaneous TID WC  . levothyroxine  25 mcg Oral QAC breakfast  . metoprolol  tartrate  12.5 mg Oral BID  . pantoprazole  40 mg Intravenous Q12H   Continuous Infusions: . sodium chloride     Principal Problem:   Symptomatic anemia Active Problems:   DM (diabetes mellitus) (HCC)   HTN (hypertension)   Acute kidney injury superimposed on CKD ([place])   History of non-ST elevation myocardial infarction (NSTEMI)   Hyperlipidemia  Time spent:   Irwin Brakeman, MD, FAAFP [facility] Pager [phone]  If 7PM-7AM, please contact night-coverage [email or web address] Password [email or web address] 10/08/2016, 2:14 PM    LOS: 4 days

## 2016-10-08 NOTE — Anesthesia Preprocedure Evaluation (Signed)
Anesthesia Evaluation  Patient identified by MRN, date of birth, ID band Patient awake    Reviewed: Allergy & Precautions, NPO status , Patient's Chart, lab work & pertinent test results  Airway Mallampati: I  TM Distance: >3 FB Neck ROM: Full    Dental   Pulmonary    Pulmonary exam normal        Cardiovascular hypertension, + Past MI  Normal cardiovascular exam     Neuro/Psych    GI/Hepatic   Endo/Other  diabetes, Type 2, Insulin Dependent, Oral Hypoglycemic Agents  Renal/GU CRFRenal disease     Musculoskeletal   Abdominal   Peds  Hematology   Anesthesia Other Findings   Reproductive/Obstetrics                             Anesthesia Physical Anesthesia Plan  ASA: III  Anesthesia Plan: MAC   Post-op Pain Management:    Induction: Intravenous  Airway Management Planned: Simple Face Mask  Additional Equipment:   Intra-op Plan:   Post-operative Plan:   Informed Consent: I have reviewed the patients History and Physical, chart, labs and discussed the procedure including the risks, benefits and alternatives for the proposed anesthesia with the patient or authorized representative who has indicated his/her understanding and acceptance.     Plan Discussed with: CRNA and Surgeon  Anesthesia Plan Comments:         Anesthesia Quick Evaluation

## 2016-10-08 NOTE — Anesthesia Postprocedure Evaluation (Signed)
Anesthesia Post Note  Patient: [name]  Procedure(s) Performed: Procedure(s) (LRB): COLONOSCOPY WITH PROPOFOL (N/A) GIVENS CAPSULE STUDY  Patient location during evaluation: PACU Anesthesia Type: MAC Level of consciousness: awake and alert Pain management: pain level controlled Vital Signs Assessment: post-procedure vital signs reviewed and stable Respiratory status: spontaneous breathing, nonlabored ventilation, respiratory function stable and patient connected to nasal cannula oxygen Cardiovascular status: stable and blood pressure returned to baseline Anesthetic complications: no       Last Vitals:  Vitals:   10/08/16 1128 10/08/16 1528  BP: 97/63 (!) 126/44  Pulse: 64 64  Resp: 18 16  Temp:  36.7 C    Last Pain:  Vitals:   10/08/16 1528  TempSrc: Oral                 Monetta Lick DAVID

## 2016-10-08 NOTE — H&P (Signed)
[name] is an 76 y.o. female.   Chief Complaint: Melena/hematochezia HPI: No obvious source noted for GI bleeding on EGD. Last colonoscopy over 5 years ago in [place] unremarkable as the patient. Currently on aspirin 81 mg as well as Plavix 75 mg.  Past Medical History:  Diagnosis Date  . Anemia   . Arthritis   . Chronic anemia   . Chronic combined systolic and diastolic CHF (congestive heart failure) ([place])    a. 2D echo 08/2016 at [facility]: EF 50-55% with inferior wall HK, impaired LV filling, fair study.  . CKD (chronic kidney disease), stage III   . Diabetes ([place])   . Gout   . HTN (hypertension)   . NSTEMI (non-ST elevated myocardial infarction) ([place])    a. Complex admission 08/2016 - with severe hyperglycemia, AKI on CKD, severe anemia down to Hgb 6.8, acute combined CHF, troponin of 8.5, cath deferred due to renal dysfunction.    Past Surgical History:  Procedure Laterality Date  . ORIF HUMERUS FRACTURE Left 02/07/2015   Procedure: OPEN REDUCTION INTERNAL FIXATION (ORIF) PROXIMAL HUMERUS FRACTURE;  Surgeon: Marybelle Killings, MD;  Location: [facility];  Service: Orthopedics;  Laterality: Left;  . TOTAL HIP ARTHROPLASTY Left 02/07/2015   Procedure: TOTAL HIP ARTHROPLASTY ANTERIOR APPROACH ;  Surgeon: Marybelle Killings, MD;  Location: [facility];  Service: Orthopedics;  Laterality: Left;  . WRIST SURGERY Left     Family History  Problem Relation Age of Onset  . Diabetes Mother   . CAD Neg Hx   . GI Bleed Neg Hx    Social History:  reports that she has never smoked. She has never used smokeless tobacco. She reports that she does not drink alcohol or use drugs.  Allergies:  Allergies  Allergen Reactions  . Insulin Glargine Swelling    "Makes me swell like a balloon all over", including face, but without any respiratory distress or rashes. Associated with weight gain.  . Statins Other (See Comments)    "I've tried them all; my muscle aches were so bad I couldn't walk".    Medications Prior  to Admission  Medication Sig Dispense Refill  . amLODipine (NORVASC) 10 MG tablet Take 10 mg by mouth daily.    Marland Kitchen aspirin EC 81 MG tablet Take 81 mg by mouth daily.    . clopidogrel (PLAVIX) 75 MG tablet Take 75 mg by mouth daily.    Marland Kitchen exenatide (BYETTA) 10 MCG/0.04ML SOPN injection Inject 0.4 mLs into the skin daily.    . fenofibrate (TRICOR) 145 MG tablet Take 145 mg by mouth daily.    . furosemide (LASIX) 20 MG tablet Take 20 mg by mouth as needed. Only has to take it when she gains 2 lbs    . gabapentin (NEURONTIN) 300 MG capsule Take 1 capsule by mouth 2 (two) times daily.     Marland Kitchen glipiZIDE (GLUCOTROL) 10 MG tablet Take 1 tablet by mouth 2 (two) times daily.    . hydrALAZINE (APRESOLINE) 25 MG tablet Take 25 mg by mouth 2 (two) times daily.    . indomethacin (INDOCIN) 50 MG capsule Take 50 mg by mouth daily.    Marland Kitchen levothyroxine (SYNTHROID, LEVOTHROID) 25 MCG tablet Take 25 mcg by mouth daily before breakfast.    . metoprolol tartrate (LOPRESSOR) 25 MG tablet Take 12.5 mg by mouth 2 (two) times daily.    . Multiple Vitamin (MULTIVITAMIN WITH MINERALS) TABS tablet Take 1 tablet by mouth daily.    Marland Kitchen  pantoprazole (PROTONIX) 40 MG tablet Take 40 mg by mouth daily.    Marland Kitchen ULORIC 40 MG tablet Take 1 tablet by mouth daily.      Results for orders placed or performed during the hospital encounter of 10/04/16 (from the past 48 hour(s))  Glucose, capillary     Status: Abnormal   Collection Time: 10/06/16 12:24 PM  Result Value Ref Range   Glucose-Capillary 151 (H) 65 - 99 mg/dL  Glucose, capillary     Status: Abnormal   Collection Time: 10/06/16  5:19 PM  Result Value Ref Range   Glucose-Capillary 114 (H) 65 - 99 mg/dL  Glucose, capillary     Status: Abnormal   Collection Time: 10/06/16 10:32 PM  Result Value Ref Range   Glucose-Capillary 141 (H) 65 - 99 mg/dL  Glucose, capillary     Status: None   Collection Time: 10/07/16  2:16 AM  Result Value Ref Range   Glucose-Capillary 82 65 - 99 mg/dL   Glucose, capillary     Status: Abnormal   Collection Time: 10/07/16  4:56 AM  Result Value Ref Range   Glucose-Capillary 108 (H) 65 - 99 mg/dL  Glucose, capillary     Status: None   Collection Time: 10/07/16  8:15 AM  Result Value Ref Range   Glucose-Capillary 98 65 - 99 mg/dL  CBC     Status: Abnormal   Collection Time: 10/07/16  8:48 AM  Result Value Ref Range   WBC 6.2 4.0 - 10.5 K/uL   RBC 3.03 (L) 3.87 - 5.11 MIL/uL   Hemoglobin 8.8 (L) 12.0 - 15.0 g/dL   HCT 27.4 (L) 36.0 - 46.0 %   MCV 90.4 78.0 - 100.0 fL   MCH 29.0 26.0 - 34.0 pg   MCHC 32.1 30.0 - 36.0 g/dL   RDW 17.5 (H) 11.5 - 15.5 %   Platelets 280 150 - 400 K/uL  Glucose, capillary     Status: Abnormal   Collection Time: 10/07/16 12:13 PM  Result Value Ref Range   Glucose-Capillary 170 (H) 65 - 99 mg/dL  Glucose, capillary     Status: Abnormal   Collection Time: 10/07/16  6:07 PM  Result Value Ref Range   Glucose-Capillary 112 (H) 65 - 99 mg/dL  Glucose, capillary     Status: Abnormal   Collection Time: 10/07/16  9:01 PM  Result Value Ref Range   Glucose-Capillary 126 (H) 65 - 99 mg/dL  Glucose, capillary     Status: None   Collection Time: 10/08/16 12:47 AM  Result Value Ref Range   Glucose-Capillary 96 65 - 99 mg/dL  Glucose, capillary     Status: None   Collection Time: 10/08/16  2:57 AM  Result Value Ref Range   Glucose-Capillary 91 65 - 99 mg/dL  Glucose, capillary     Status: Abnormal   Collection Time: 10/08/16  7:39 AM  Result Value Ref Range   Glucose-Capillary 104 (H) 65 - 99 mg/dL  Glucose, capillary     Status: Abnormal   Collection Time: 10/08/16 10:33 AM  Result Value Ref Range   Glucose-Capillary 127 (H) 65 - 99 mg/dL   No results found.  ROS  As per previous consultation.  Blood pressure (!) 146/45, pulse 73, temperature 98.3 F (36.8 C), temperature source Oral, resp. rate 14, height 5\' 4"  (1.626 m), weight 84.4 kg (186 lb), SpO2 97 %. Physical Exam  Mild pallor, not in acute  distress, not using accessory muscles of respiration, abdomen appeared  soft nondistended and nontender with normoactive bowel sounds  Assessment/Plan Diagnostic colonoscopy today.  Ronnette Juniper, MD 10/08/2016, 10:54 AM

## 2016-10-08 NOTE — Care Management Important Message (Signed)
Important Message  Patient Details  Name: [name] MRN: [id number] Date of Birth: 1940-07-11   Medicare Important Message Given:  Yes    Nathen May 10/08/2016, 2:19 PM

## 2016-10-08 NOTE — Brief Op Note (Signed)
10/04/2016 - 10/08/2016  11:18 AM  PATIENT:  [name]  76 y.o. female  PRE-OPERATIVE DIAGNOSIS:  Anemia/melena. Negative EGD  POST-OPERATIVE DIAGNOSIS:  descedning colon polyp not removed due to patient on plavix, diverticulosis, hemorrhoids  PROCEDURE:  Procedure(s): COLONOSCOPY WITH PROPOFOL (N/A)  SURGEON:  Surgeon(s) and Role:    Ronnette Juniper, MD - Primary  PHYSICIAN ASSISTANT:   ASSISTANTS: none   ANESTHESIA:   MAC  EBL:  Total I/O In: 100 [I.V.:100] Out: -   BLOOD ADMINISTERED:none  DRAINS: none   LOCAL MEDICATIONS USED:  NONE  SPECIMEN:  No Specimen  DISPOSITION OF SPECIMEN:  N/A  COUNTS:  ACTION TAKEN: None  TOURNIQUET:  * No tourniquets in log *  DICTATION: .Dragon Dictation  PLAN OF CARE: Admit to inpatient   PATIENT DISPOSITION:  PACU - hemodynamically stable.   Delay start of Pharmacological VTE agent (>24hrs) due to surgical blood loss or risk of bleeding: not applicable

## 2016-10-08 NOTE — Interval H&P Note (Signed)
History and Physical Interval Note: EGD was unremarkable on 10/06/2016. Proceed with a diagnostic colonoscopy as patient presented with severe anemia and continues to be on aspirin and Plavix.  10/08/2016 10:53 AM  [name]  has presented today for surgery, with the diagnosis of Anemia/melena. Negative EGD  The various methods of treatment have been discussed with the patient and family. After consideration of risks, benefits and other options for treatment, the patient has consented to  Procedure(s): COLONOSCOPY WITH PROPOFOL (N/A) as a surgical intervention .  The patient's history has been reviewed, patient examined, no change in status, stable for surgery.  I have reviewed the patient's chart and labs.  Questions were answered to the patient's satisfaction.     Ronnette Juniper

## 2016-10-08 NOTE — Transfer of Care (Signed)
Immediate Anesthesia Transfer of Care Note  Patient: [name]  Procedure(s) Performed: Procedure(s): COLONOSCOPY WITH PROPOFOL (N/A)  Patient Location: Endoscopy Unit  Anesthesia Type:MAC  Level of Consciousness: awake, alert  and oriented  Airway & Oxygen Therapy: Patient Spontanous Breathing and Patient connected to nasal cannula oxygen  Post-op Assessment: Report given to RN and Post -op Vital signs reviewed and stable  Post vital signs: Reviewed and stable  Last Vitals:  Vitals:   10/08/16 0940 10/08/16 1025  BP: (!) 120/46 (!) 146/45  Pulse: 70 73  Resp:  14  Temp:  36.8 C    Last Pain:  Vitals:   10/08/16 1025  TempSrc: Oral         Complications: No apparent anesthesia complications

## 2016-10-09 ENCOUNTER — Encounter (HOSPITAL_COMMUNITY): Payer: Self-pay | Admitting: Gastroenterology

## 2016-10-09 ENCOUNTER — Encounter (HOSPITAL_COMMUNITY)
Admission: EM | Disposition: A | Discharge status: Home / Self Care | Payer: Self-pay | Source: Home / Self Care | Attending: Family Medicine

## 2016-10-09 LAB — BASIC METABOLIC PANEL
Anion gap: 7 (ref 5–15)
BUN: 37 mg/dL — ABNORMAL HIGH (ref 6–20)
CALCIUM: 8.5 mg/dL — AB (ref 8.9–10.3)
CO2: 20 mmol/L — AB (ref 22–32)
CREATININE: 2.61 mg/dL — AB (ref 0.44–1.00)
Chloride: 112 mmol/L — ABNORMAL HIGH (ref 101–111)
GFR, EST AFRICAN AMERICAN: 19 mL/min — AB (ref >60)
GFR, EST NON AFRICAN AMERICAN: 17 mL/min — AB (ref >60)
GLUCOSE: 184 mg/dL — AB (ref 65–99)
Potassium: 5.8 mmol/L — ABNORMAL HIGH (ref 3.5–5.1)
Sodium: 139 mmol/L (ref 135–145)

## 2016-10-09 LAB — CBC
HEMATOCRIT: 27.1 % — AB (ref 36.0–46.0)
Hemoglobin: 8.2 g/dL — ABNORMAL LOW (ref 12.0–15.0)
MCH: 27.7 pg (ref 26.0–34.0)
MCHC: 30.3 g/dL (ref 30.0–36.0)
MCV: 91.6 fL (ref 78.0–100.0)
PLATELETS: 286 10*3/uL (ref 150–400)
RBC: 2.96 MIL/uL — ABNORMAL LOW (ref 3.87–5.11)
RDW: 16.7 % — AB (ref 11.5–15.5)
WBC: 5.1 10*3/uL (ref 4.0–10.5)

## 2016-10-09 LAB — GLUCOSE, CAPILLARY
GLUCOSE-CAPILLARY: 202 mg/dL — AB (ref 65–99)
Glucose-Capillary: 206 mg/dL — ABNORMAL HIGH (ref 65–99)
Glucose-Capillary: 188 mg/dL — ABNORMAL HIGH (ref 65–99)

## 2016-10-09 LAB — POTASSIUM: Potassium: 5.6 mmol/L — ABNORMAL HIGH (ref 3.5–5.1)

## 2016-10-09 SURGERY — IMAGING PROCEDURE, GI TRACT, INTRALUMINAL, VIA CAPSULE

## 2016-10-09 MED ORDER — FUROSEMIDE 10 MG/ML IJ SOLN
20.0000 mg | Freq: Once | INTRAMUSCULAR | Status: AC
  Administered 2016-10-09: 20 mg via INTRAVENOUS
  Filled 2016-10-09: qty 2

## 2016-10-09 MED ORDER — FUROSEMIDE 40 MG PO TABS
40.0000 mg | ORAL_TABLET | Freq: Once | ORAL | Status: AC
  Administered 2016-10-09: 40 mg via ORAL
  Filled 2016-10-09: qty 1

## 2016-10-09 MED ORDER — SODIUM POLYSTYRENE SULFONATE 15 GM/60ML PO SUSP
30.0000 g | Freq: Once | ORAL | Status: DC
  Filled 2016-10-09: qty 120

## 2016-10-09 MED ORDER — SODIUM POLYSTYRENE SULFONATE 15 GM/60ML PO SUSP
30.0000 g | Freq: Once | ORAL | Status: AC
  Administered 2016-10-09: 30 g via ORAL
  Filled 2016-10-09: qty 120

## 2016-10-09 NOTE — Discharge Instructions (Signed)
Follow up with GI in 2 weeks to discuss capsule endoscopy results  Return if symptoms recur, worsen or new problems develop.  Have your CBC rechecked in 1-2 weeks with your primary care provider.   Take over the counter iron tablet once per day.

## 2016-10-09 NOTE — Progress Notes (Signed)
Nsg Discharge Note  Admit Date:  10/04/2016 Discharge date: 10/09/2016   Landry Mellow to be D/C'd Home per MD order.  AVS completed.  Copy for chart, and copy for patient signed, and dated. Patient/caregiver able to verbalize understanding.  Discharge Medication: Allergies as of 10/09/2016      Reactions   Insulin Glargine Swelling   "Makes me swell like a balloon all over", including face, but without any respiratory distress or rashes. Associated with weight gain.   Statins Other (See Comments)   "I've tried them all; my muscle aches were so bad I couldn't walk".      Medication List    STOP taking these medications   exenatide 10 MCG/0.04ML Sopn injection Commonly known as:  BYETTA   fenofibrate 145 MG tablet Commonly known as:  TRICOR   indomethacin 50 MG capsule Commonly known as:  INDOCIN     TAKE these medications   amLODipine 10 MG tablet Commonly known as:  NORVASC Take 10 mg by mouth daily.   aspirin EC 81 MG tablet Take 81 mg by mouth daily.   clopidogrel 75 MG tablet Commonly known as:  PLAVIX Take 75 mg by mouth daily.   furosemide 20 MG tablet Commonly known as:  LASIX Take 20 mg by mouth as needed. Only has to take it when she gains 2 lbs   gabapentin 300 MG capsule Commonly known as:  NEURONTIN Take 1 capsule by mouth 2 (two) times daily.   glipiZIDE 10 MG tablet Commonly known as:  GLUCOTROL Take 1 tablet by mouth 2 (two) times daily.   hydrALAZINE 25 MG tablet Commonly known as:  APRESOLINE Take 25 mg by mouth 2 (two) times daily.   levothyroxine 25 MCG tablet Commonly known as:  SYNTHROID, LEVOTHROID Take 25 mcg by mouth daily before breakfast.   metoprolol tartrate 25 MG tablet Commonly known as:  LOPRESSOR Take 12.5 mg by mouth 2 (two) times daily.   multivitamin with minerals Tabs tablet Take 1 tablet by mouth daily.   pantoprazole 40 MG tablet Commonly known as:  PROTONIX Take 40 mg by mouth daily.   ULORIC 40 MG  tablet Generic drug:  febuxostat Take 1 tablet by mouth daily.       Discharge Assessment: Vitals:   10/09/16 0930 10/09/16 1454  BP: (!) 134/44 (!) 119/45  Pulse:  73  Resp:  18  Temp:  98.2 F (36.8 C)   Skin clean, dry and intact without evidence of skin break down, no evidence of skin tears noted. IV catheter discontinued intact. Site without signs and symptoms of complications - no redness or edema noted at insertion site, patient denies c/o pain - only slight tenderness at site.  Dressing with slight pressure applied.  D/c Instructions-Education: Discharge instructions given to patient/family with verbalized understanding. D/c education completed with patient/family including follow up instructions, medication list, d/c activities limitations if indicated, with other d/c instructions as indicated by MD - patient able to verbalize understanding, all questions fully answered. Patient instructed to return to ED, call 911, or call MD for any changes in condition.  Patient escorted via [place], and D/C home via private auto.  Blyss Lugar Margaretha Sheffield, RN 10/09/2016 6:07 PM

## 2016-10-09 NOTE — Discharge Summary (Signed)
Physician Discharge Summary  [name] WVP:[id number] DOB: 09/26/1940 DOA: 10/04/2016  PCP: Ricard Dillon, MD Gastroenterologist:  [facility] Dr. Jonelle Sports   Admit date: 10/04/2016 Discharge date: 10/09/2016  Admitted From: Home  Disposition: Home   Recommendations for Outpatient Follow-up:  1. Follow up with PCP in 1 weeks 2. Follow up with cardiology in 2 weeks 3. Follow up with GI in 2 weeks to discuss capsule endoscopy results 4. Please obtain BMP/CBC in one week 5. Please refer to local nephrologist to establish care as soon as possible 6. Please review medications with patient to be sure she isn't taking nephrotoxins (i'm concerned about what she may be taking at home that providers are not fully aware of) 7. Please follow up on the following pending results: capsule endoscopy results  Discharge Condition: STABLE  CODE STATUS: FULL  Diet recommendation: Renal, heart, Carb modified  Brief/Interim Summary: [name] is a 76 y.o. female with medical history significant of HTN, DM, prior anemia by labs 2016, CKD (prior baseline 2), recent complex admission for anemia (Hgb 9 in 2016), AKI on CKD, acute combined CHF, NSTEMI.  Patient was admitted to [facility] with a "mild heart attack" - thought she had food poisoning due to nausea (no vomiting) on 4/23.  Went to [facility], doctor thought maybe she had PNA and transferred her to [facility].    Per cardiology PA Dunn: Extensive records reviewed. Initially presented to ED at [facility] on 09/17/16 due to nausea (thought she had food poisoning), found to have marked hyperglycemia to 510 and also noted to have hypoxia to 80% RA requiring bipap, lactic acidosis, severe anemia with Hgb 6.8, low grade fever, AKI (on suspected CKD) with Cr 2.89. Patient reported she'd previously been told Cr was around 2. CXR was concerning for PNA, underlying vascular congestion and mild cardiomegaly. CT chest (not angio) showed small bilateral  effusions, ground-glas sopacities, diffuse pulm edema with possible RLL PNA, diffuse coronary calcifications, scattered aortic calcifications. She was treated with IV fluids for concern for sepsis but also IV Lasix, 2 U PRBCs, antibiotics, protonix, and Tylenol and transferred to [place] available from ED visit showed troponins of 0.36, 0.38, lactate 3.9, glucose 402, BUN 56, Cr 2.89, calcium 8.4, TProt 6.7, albumin 3.7, AST 78, ALT 48, Na 136, K 4.7, CO2 20.9, INR 1.2, pBNP 13638, WBC 13, Hgb 6.8, Hct 21. Per [facility] records, troponin was found to be 8.554 > 7.961 > 7.101. Her ECG had no ischemic changes. TTE showed EF 50-55% with apical LV inferior wall hypokinesis and impaired LV filling. Cardiology was consulted for newly diagnosed combined HF with exacerbation who felt exacerbation most likely induced by NSTEMI. Her antibiotics were discontinued as her presentation was felt c/w CHF rather than PNA. It sounds like she was treated medically with aspirin, Plavix, heparin gtt, and metoprolol. She has h/o statin intolerance so was restarted on home fenofibrate. Upon discharge her Cr was 2.77 with recommendation to consider outpatient ischemic evaluation upon stabilization of renal function. A1C 7.0, LDL 136, HDL 36, trig 310. TSH wnl. Her serum iron and % sat were low, ferritin high in the setting of acute illness. Anemia was felt secondary to chronic disease in setting of likely CKD, although could consider GIB with OP workup. Last AST 56, ALT 45, albumin 3.2, Hgb 9.1, plt 383.  The patient reports that they were unable to do cath due to acute on chronic renal dysfunction.  She did  not have a stress test.  The plan at discharge was to contact a heart specialist and to obtain a PCP and decide where to go from there.  She went today to cardiology for a routine hospital follow-up visit.  Her friend reported black stool (they thought it was medication-related) with fatigue and weakness; Hgb was 4.9 and  they were sent to the ER.    She was transfused with 1 unit at [facility] and was transfused another unit at [facility].  They did not further evaluate that.  She never had symptoms of heart attack.  On presentation, nausea without vomiting.  She was previously taking iron and so they didn't notice the dark stools.  Went off iron aboiut a month ago and stools cleared. Dark stools came back about 10- days ago and so her friend again thought it was medication-related.  +weak, dizzy.  Unable to walk across the room without help.  No further nausea.  Last c-scope was 5-6 years ago and it was normal.  +SOB with ambulation - doesn't hit her on the way to the bathroom but it hits her on the way back.  Has chronic neuropathic foot pain.    Hospital course: 76 y.o.femalewith medical history significant of HTN, DM, prior anemia by labs 2016, CKD (prior baseline 2), recent complex admission for anemia (Hgb 9 in 2016), AKI on CKD, acute combined CHF, NSTEM admitted with symptomatic anemia (Hg 4.9) and GI bleeding.    Assessment & Plan:    Symptomatic anemia - Pt feels much better after 3 units PRBCs.  EGD shows gastritis.  Colonoscopy 5/14 showed small few diverticulosis in sigmoid, a 5 mm polyp in transverse colon(not removed due to concurrent use of Plavix and aspirin) and internal hemorrhoids.  Resumed plavix and aspirin.  capsule endoscopy study done but results pending.  I spoke with Dr. Therisa Doyne who will see patient outpatient to follow up results of that study and OK with patient being discharged home.     CAD s/p recent NSTEMI - resumed plavix and continue aspirin.  Protonix for GI protection.   AKI on CKD - Improved slightly with hydration.  Avoiding nephrotoxins. She reported taking exenatide, indomethacin and tricor which are not recommended to be used with her GFR so were discontinued at discharge. Recommend she establish care with a local nephrologist.    DM type 2 - holding home oral meds, treating with  SSI coverage in hospital.    Hypertension - stable, resume home amlodipine, hydralazine and lopressor.  Hyperlipidemia- Statin intolerant, continue tricor, follow up with her cardiologist.   Hyperkalemia - secondary to CKD, given lasix in hospital and recommend recheck BMP next week with PCP.    DVT prophylaxis: SCD Code Status: full  Family Communication: friend at bedside Disposition Plan: home   Consultants:  [facility]  Procedures:  EGD 5/12  Capsule endoscopy 5/14  Colonoscopy 5/14  Discharge Diagnoses:  Principal Problem:   Symptomatic anemia Active Problems:   DM (diabetes mellitus) ([place])   HTN (hypertension)   Acute kidney injury superimposed on CKD ([place])   History of non-ST elevation myocardial infarction (NSTEMI)   Hyperlipidemia  Discharge Instructions  Discharge Instructions    Increase activity slowly    Complete by:  As directed      Allergies as of 10/09/2016      Reactions   Insulin Glargine Swelling   "Makes me swell like a balloon all over", including face, but without any respiratory distress or rashes. Associated  with weight gain.   Statins Other (See Comments)   "I've tried them all; my muscle aches were so bad I couldn't walk".      Medication List    STOP taking these medications   exenatide 10 MCG/0.04ML Sopn injection Commonly known as:  BYETTA   fenofibrate 145 MG tablet Commonly known as:  TRICOR   indomethacin 50 MG capsule Commonly known as:  INDOCIN     TAKE these medications   amLODipine 10 MG tablet Commonly known as:  NORVASC Take 10 mg by mouth daily.   aspirin EC 81 MG tablet Take 81 mg by mouth daily.   clopidogrel 75 MG tablet Commonly known as:  PLAVIX Take 75 mg by mouth daily.   furosemide 20 MG tablet Commonly known as:  LASIX Take 20 mg by mouth as needed. Only has to take it when she gains 2 lbs   gabapentin 300 MG capsule Commonly known as:  NEURONTIN Take 1 capsule by mouth 2 (two) times  daily.   glipiZIDE 10 MG tablet Commonly known as:  GLUCOTROL Take 1 tablet by mouth 2 (two) times daily.   hydrALAZINE 25 MG tablet Commonly known as:  APRESOLINE Take 25 mg by mouth 2 (two) times daily.   levothyroxine 25 MCG tablet Commonly known as:  SYNTHROID, LEVOTHROID Take 25 mcg by mouth daily before breakfast.   metoprolol tartrate 25 MG tablet Commonly known as:  LOPRESSOR Take 12.5 mg by mouth 2 (two) times daily.   multivitamin with minerals Tabs tablet Take 1 tablet by mouth daily.   pantoprazole 40 MG tablet Commonly known as:  PROTONIX Take 40 mg by mouth daily.   ULORIC 40 MG tablet Generic drug:  febuxostat Take 1 tablet by mouth daily.      Follow-up Information    Ricard Dillon, MD. Schedule an appointment as soon as possible for a visit in 1 week(s).   Specialty:  Internal Medicine Contact information: [place] [address] [phone]        Ronnette Juniper, MD. Schedule an appointment as soon as possible for a visit in 2 week(s).   Specialty:  Gastroenterology Why:  Hospital Follow Up  Contact information: [address] [place]  [address] [phone]          Allergies  Allergen Reactions  . Insulin Glargine Swelling    "Makes me swell like a balloon all over", including face, but without any respiratory distress or rashes. Associated with weight gain.  . Statins Other (See Comments)    "I've tried them all; my muscle aches were so bad I couldn't walk".   Procedures/Studies:  No results found. (Echo, Carotid, EGD, Colonoscopy, ERCP)   Colonoscopy showed small few diverticulosis in sigmoid, a 5 mm polyp in transverse colon(not removed due to concurrent use of Plavix and aspirin) and internal hemorrhoids.  EGD: no evidence of bleeding found.   Subjective: Pt says she feels fine and really wants to go home.    Discharge Exam: Vitals:   10/09/16 0930 10/09/16 1454  BP: (!) 134/44  (!) 119/45  Pulse:  73  Resp:  18  Temp:  98.2 F (36.8 C)   Vitals:   10/08/16 2117 10/09/16 0554 10/09/16 0930 10/09/16 1454  BP: (!) 132/44 (!) 114/46 (!) 134/44 (!) 119/45  Pulse: 75 71  73  Resp: 18 18  18   Temp: 98.5 F (36.9 C) 98.8 F (37.1 C)  98.2  F (36.8 C)  TempSrc: Oral   Oral  SpO2: 97% 99%  99%  Weight:      Height:        General exam: awake, sitting up in chair.  Respiratory system: Clear. No increased work of breathing. Cardiovascular system: S1 & S2 heard, RRR. No JVD, murmurs, gallops, clicks or pedal edema. Gastrointestinal system: Abdomen is nondistended, soft and nontender. Normal bowel sounds heard. Central nervous system: Alert and oriented. No focal neurological deficits. Extremities: no CCE.  The results of significant diagnostics from this hospitalization (including imaging, microbiology, ancillary and laboratory) are listed below for reference.     Microbiology: No results found for this or any previous visit (from the past 240 hour(s)).   Labs: BNP (last 3 results) No results for input(s): BNP in the last 8760 hours. Basic Metabolic Panel:  Recent Labs Lab 10/04/16 1525 10/05/16 0218 10/06/16 0834 10/09/16 0706 10/09/16 1547  NA 135 139 140 139  --   K 4.7 4.7 5.0 5.8* 5.6*  CL 108 112* 113* 112*  --   CO2 20* 19* 19* 20*  --   GLUCOSE 314* 75 156* 184*  --   BUN 68* 57* 50* 37*  --   CREATININE 3.28* 2.88* 2.81* 2.61*  --   CALCIUM 8.6* 8.2* 8.6* 8.5*  --   PHOS  --   --  4.1  --   --    Liver Function Tests:  Recent Labs Lab 10/06/16 0834  ALBUMIN 2.9*   No results for input(s): LIPASE, AMYLASE in the last 168 hours. No results for input(s): AMMONIA in the last 168 hours. CBC:  Recent Labs Lab 10/04/16 1525 10/04/16 1714 10/05/16 0218 10/06/16 0834 10/07/16 0848 10/09/16 0706  WBC 7.9 8.0 7.7 7.6 6.2 5.1  NEUTROABS 5.7  --   --   --   --   --   HGB 4.9* 5.0* 6.1* 8.7* 8.8* 8.2*  HCT 15.8* 16.4* 19.5* 28.3*  27.4* 27.1*  MCV 95.2 94.8 88.6 89.0 90.4 91.6  PLT 423* 456* 310 291 280 286   Cardiac Enzymes:  Recent Labs Lab 10/04/16 1640  TROPONINI <0.03   BNP: Invalid input(s): POCBNP CBG:  Recent Labs Lab 10/08/16 1728 10/08/16 2213 10/09/16 0326 10/09/16 0830 10/09/16 1232  GLUCAP 117* 182* 206* 202* 188*   D-Dimer No results for input(s): DDIMER in the last 72 hours. Hgb A1c No results for input(s): HGBA1C in the last 72 hours. Lipid Profile No results for input(s): CHOL, HDL, LDLCALC, TRIG, CHOLHDL, LDLDIRECT in the last 72 hours. Thyroid function studies No results for input(s): TSH, T4TOTAL, T3FREE, THYROIDAB in the last 72 hours.  Invalid input(s): FREET3 Anemia work up No results for input(s): VITAMINB12, FOLATE, FERRITIN, TIBC, IRON, RETICCTPCT in the last 72 hours. Urinalysis    Component Value Date/Time   COLORURINE YELLOW 02/08/2015 1615   APPEARANCEUR CLOUDY (A) 02/08/2015 1615   LABSPEC 1.010 02/08/2015 1615   PHURINE 5.0 02/08/2015 1615   GLUCOSEU 100 (A) 02/08/2015 1615   HGBUR TRACE (A) 02/08/2015 1615   BILIRUBINUR NEGATIVE 02/08/2015 1615   KETONESUR NEGATIVE 02/08/2015 1615   PROTEINUR 30 (A) 02/08/2015 1615   UROBILINOGEN 0.2 02/08/2015 1615   NITRITE NEGATIVE 02/08/2015 1615   LEUKOCYTESUR SMALL (A) 02/08/2015 1615   Sepsis Labs Invalid input(s): PROCALCITONIN,  WBC,  LACTICIDVEN Microbiology No results found for this or any previous visit (from the past 240 hour(s)).  Time coordinating discharge: 34 mins  SIGNED:  Irwin Brakeman, MD  [facility] 10/09/2016, 4:59 PM Pager [phone]  If 7PM-7AM, please contact night-coverage [email or web address] Password [email or web address]

## 2016-10-09 NOTE — Progress Notes (Signed)
10/09/2016  I spoke with GI Dr Therisa Doyne, she said that she was not able to read capsule due to technical problems and would recommend patient discharge home with outpatient follow up with her to review results.  Her Hg has been stable.    Murvin Natal MD

## 2016-10-14 ENCOUNTER — Encounter: Payer: Self-pay | Admitting: Cardiology

## 2016-10-14 ENCOUNTER — Encounter: Payer: Self-pay | Admitting: Internal Medicine

## 2016-10-23 ENCOUNTER — Encounter: Payer: Self-pay | Admitting: Internal Medicine

## 2016-10-25 ENCOUNTER — Emergency Department (HOSPITAL_COMMUNITY): Payer: Medicare PPO

## 2016-10-25 ENCOUNTER — Encounter (HOSPITAL_COMMUNITY): Payer: Self-pay | Admitting: Emergency Medicine

## 2016-10-25 ENCOUNTER — Ambulatory Visit (INDEPENDENT_AMBULATORY_CARE_PROVIDER_SITE_OTHER): Payer: Medicare PPO | Admitting: Family Medicine

## 2016-10-25 ENCOUNTER — Encounter: Payer: Self-pay | Admitting: Family Medicine

## 2016-10-25 ENCOUNTER — Telehealth: Payer: Self-pay

## 2016-10-25 ENCOUNTER — Inpatient Hospital Stay (HOSPITAL_COMMUNITY)
Admission: EM | Admit: 2016-10-25 | Discharge: 2016-10-30 | DRG: 812 | Disposition: A | Discharge status: Home / Self Care | Payer: Medicare PPO | Attending: Internal Medicine | Admitting: Internal Medicine

## 2016-10-25 VITALS — BP 114/56 | HR 96 | Temp 97.9°F | Resp 16 | Ht 64.0 in | Wt 188.1 lb

## 2016-10-25 DIAGNOSIS — Z7982 Long term (current) use of aspirin: Secondary | ICD-10-CM

## 2016-10-25 DIAGNOSIS — I2510 Atherosclerotic heart disease of native coronary artery without angina pectoris: Secondary | ICD-10-CM | POA: Diagnosis present

## 2016-10-25 DIAGNOSIS — N189 Chronic kidney disease, unspecified: Secondary | ICD-10-CM | POA: Diagnosis not present

## 2016-10-25 DIAGNOSIS — K921 Melena: Secondary | ICD-10-CM | POA: Diagnosis not present

## 2016-10-25 DIAGNOSIS — R5383 Other fatigue: Secondary | ICD-10-CM

## 2016-10-25 DIAGNOSIS — I959 Hypotension, unspecified: Secondary | ICD-10-CM | POA: Diagnosis not present

## 2016-10-25 DIAGNOSIS — I700 Atherosclerosis of aorta: Secondary | ICD-10-CM | POA: Diagnosis present

## 2016-10-25 DIAGNOSIS — E1122 Type 2 diabetes mellitus with diabetic chronic kidney disease: Secondary | ICD-10-CM | POA: Diagnosis not present

## 2016-10-25 DIAGNOSIS — Z96642 Presence of left artificial hip joint: Secondary | ICD-10-CM | POA: Diagnosis present

## 2016-10-25 DIAGNOSIS — D649 Anemia, unspecified: Secondary | ICD-10-CM | POA: Diagnosis present

## 2016-10-25 DIAGNOSIS — I5042 Chronic combined systolic (congestive) and diastolic (congestive) heart failure: Secondary | ICD-10-CM | POA: Diagnosis present

## 2016-10-25 DIAGNOSIS — M109 Gout, unspecified: Secondary | ICD-10-CM | POA: Diagnosis present

## 2016-10-25 DIAGNOSIS — D500 Iron deficiency anemia secondary to blood loss (chronic): Secondary | ICD-10-CM

## 2016-10-25 DIAGNOSIS — K219 Gastro-esophageal reflux disease without esophagitis: Secondary | ICD-10-CM | POA: Diagnosis present

## 2016-10-25 DIAGNOSIS — Z79899 Other long term (current) drug therapy: Secondary | ICD-10-CM

## 2016-10-25 DIAGNOSIS — N185 Chronic kidney disease, stage 5: Secondary | ICD-10-CM | POA: Diagnosis not present

## 2016-10-25 DIAGNOSIS — N179 Acute kidney failure, unspecified: Secondary | ICD-10-CM | POA: Diagnosis not present

## 2016-10-25 DIAGNOSIS — K2901 Acute gastritis with bleeding: Secondary | ICD-10-CM | POA: Diagnosis not present

## 2016-10-25 DIAGNOSIS — K2950 Unspecified chronic gastritis without bleeding: Secondary | ICD-10-CM | POA: Diagnosis present

## 2016-10-25 DIAGNOSIS — R531 Weakness: Secondary | ICD-10-CM

## 2016-10-25 DIAGNOSIS — N183 Chronic kidney disease, stage 3 (moderate): Secondary | ICD-10-CM | POA: Diagnosis present

## 2016-10-25 DIAGNOSIS — I252 Old myocardial infarction: Secondary | ICD-10-CM

## 2016-10-25 DIAGNOSIS — Z7984 Long term (current) use of oral hypoglycemic drugs: Secondary | ICD-10-CM

## 2016-10-25 DIAGNOSIS — E039 Hypothyroidism, unspecified: Secondary | ICD-10-CM | POA: Diagnosis present

## 2016-10-25 DIAGNOSIS — D62 Acute posthemorrhagic anemia: Secondary | ICD-10-CM | POA: Diagnosis not present

## 2016-10-25 DIAGNOSIS — Z833 Family history of diabetes mellitus: Secondary | ICD-10-CM

## 2016-10-25 DIAGNOSIS — Z7902 Long term (current) use of antithrombotics/antiplatelets: Secondary | ICD-10-CM

## 2016-10-25 DIAGNOSIS — K5641 Fecal impaction: Secondary | ICD-10-CM | POA: Diagnosis present

## 2016-10-25 DIAGNOSIS — R778 Other specified abnormalities of plasma proteins: Secondary | ICD-10-CM

## 2016-10-25 DIAGNOSIS — E785 Hyperlipidemia, unspecified: Secondary | ICD-10-CM | POA: Diagnosis present

## 2016-10-25 DIAGNOSIS — R7989 Other specified abnormal findings of blood chemistry: Secondary | ICD-10-CM

## 2016-10-25 DIAGNOSIS — Z888 Allergy status to other drugs, medicaments and biological substances status: Secondary | ICD-10-CM

## 2016-10-25 DIAGNOSIS — I130 Hypertensive heart and chronic kidney disease with heart failure and stage 1 through stage 4 chronic kidney disease, or unspecified chronic kidney disease: Secondary | ICD-10-CM | POA: Diagnosis present

## 2016-10-25 DIAGNOSIS — J811 Chronic pulmonary edema: Secondary | ICD-10-CM

## 2016-10-25 HISTORY — DX: Gastritis, unspecified, without bleeding: K29.70

## 2016-10-25 LAB — CBC WITH DIFFERENTIAL/PLATELET
BASOS ABS: 0 10*3/uL (ref 0.0–0.1)
BASOS PCT: 0 %
BASOS PCT: 0 %
Basophils Absolute: 0 cells/uL (ref 0–200)
Eosinophils Absolute: 102 cells/uL (ref 15–500)
Eosinophils Absolute: 0 10*3/uL (ref 0.0–0.7)
Eosinophils Relative: 1 %
Eosinophils Relative: 0 %
HCT: 12.6 % — ABNORMAL LOW (ref 36.0–46.0)
HEMATOCRIT: 14.6 % — AB (ref 35.0–45.0)
Hemoglobin: 4.4 g/dL — CL (ref 11.7–15.5)
Hemoglobin: 3.8 g/dL — CL (ref 12.0–15.0)
LYMPHS ABS: 1530 {cells}/uL (ref 850–3900)
LYMPHS PCT: 15 %
LYMPHS PCT: 12 %
Lymphs Abs: 1.1 10*3/uL (ref 0.7–4.0)
MCH: 27.5 pg (ref 27.0–33.0)
MCH: 27.7 pg (ref 26.0–34.0)
MCHC: 30.1 g/dL — ABNORMAL LOW (ref 32.0–36.0)
MCHC: 30.2 g/dL (ref 30.0–36.0)
MCV: 91.3 fL (ref 80.0–100.0)
MCV: 92 fL (ref 78.0–100.0)
MONO ABS: 510 {cells}/uL (ref 200–950)
MPV: 8.5 fL (ref 7.5–12.5)
Monocytes Absolute: 0.4 10*3/uL (ref 0.1–1.0)
Monocytes Relative: 5 %
Monocytes Relative: 4 %
NEUTROS ABS: 7.9 10*3/uL — AB (ref 1.7–7.7)
Neutro Abs: 8058 cells/uL — ABNORMAL HIGH (ref 1500–7800)
Neutrophils Relative %: 79 %
Neutrophils Relative %: 83 %
PLATELETS: 316 10*3/uL (ref 150–400)
Platelets: 439 10*3/uL — ABNORMAL HIGH (ref 140–400)
RBC: 1.6 MIL/uL — AB (ref 3.80–5.10)
RBC: 1.37 MIL/uL — AB (ref 3.87–5.11)
RDW: 16.6 % — ABNORMAL HIGH (ref 11.0–15.0)
RDW: 17 % — ABNORMAL HIGH (ref 11.5–15.5)
WBC: 10.2 10*3/uL (ref 3.8–10.8)
WBC: 9.5 10*3/uL (ref 4.0–10.5)

## 2016-10-25 LAB — COMPREHENSIVE METABOLIC PANEL
ALT: 21 U/L (ref 14–54)
AST: 36 U/L (ref 15–41)
Albumin: 2.8 g/dL — ABNORMAL LOW (ref 3.5–5.0)
Alkaline Phosphatase: 41 U/L (ref 38–126)
Anion gap: 12 (ref 5–15)
BUN: 60 mg/dL — ABNORMAL HIGH (ref 6–20)
CHLORIDE: 106 mmol/L (ref 101–111)
CO2: 20 mmol/L — ABNORMAL LOW (ref 22–32)
Calcium: 7.9 mg/dL — ABNORMAL LOW (ref 8.9–10.3)
Creatinine, Ser: 2.76 mg/dL — ABNORMAL HIGH (ref 0.44–1.00)
GFR calc non Af Amer: 16 mL/min — ABNORMAL LOW (ref >60)
GFR, EST AFRICAN AMERICAN: 18 mL/min — AB (ref >60)
Glucose, Bld: 229 mg/dL — ABNORMAL HIGH (ref 65–99)
POTASSIUM: 5.1 mmol/L (ref 3.5–5.1)
SODIUM: 138 mmol/L (ref 135–145)
Total Bilirubin: 0.5 mg/dL (ref 0.3–1.2)
Total Protein: 5.7 g/dL — ABNORMAL LOW (ref 6.5–8.1)

## 2016-10-25 LAB — LACTIC ACID, PLASMA: LACTIC ACID, VENOUS: 3.6 mmol/L — AB (ref 0.5–1.9)

## 2016-10-25 LAB — COMPLETE METABOLIC PANEL WITH GFR
ALT: 18 U/L (ref 6–29)
AST: 23 U/L (ref 10–35)
Albumin: 3.3 g/dL — ABNORMAL LOW (ref 3.6–5.1)
Alkaline Phosphatase: 49 U/L (ref 33–130)
BUN: 57 mg/dL — ABNORMAL HIGH (ref 7–25)
CALCIUM: 8.4 mg/dL — AB (ref 8.6–10.4)
CHLORIDE: 106 mmol/L (ref 98–110)
CO2: 21 mmol/L (ref 20–31)
Creat: 2.58 mg/dL — ABNORMAL HIGH (ref 0.60–0.93)
GFR, EST AFRICAN AMERICAN: 20 mL/min — AB (ref >=60)
GFR, EST NON AFRICAN AMERICAN: 17 mL/min — AB (ref >=60)
Glucose, Bld: 232 mg/dL — ABNORMAL HIGH (ref 65–99)
POTASSIUM: 5 mmol/L (ref 3.5–5.3)
Sodium: 140 mmol/L (ref 135–146)
Total Bilirubin: 0.3 mg/dL (ref 0.2–1.2)
Total Protein: 5.8 g/dL — ABNORMAL LOW (ref 6.1–8.1)

## 2016-10-25 LAB — URINALYSIS, ROUTINE W REFLEX MICROSCOPIC
BILIRUBIN URINE: NEGATIVE
GLUCOSE, UA: 50 mg/dL — AB
HGB URINE DIPSTICK: NEGATIVE
Ketones, ur: NEGATIVE mg/dL
LEUKOCYTES UA: NEGATIVE
NITRITE: NEGATIVE
Protein, ur: 30 mg/dL — AB
RBC / HPF: NONE SEEN RBC/hpf (ref 0–5)
SPECIFIC GRAVITY, URINE: 1.013 (ref 1.005–1.030)
Squamous Epithelial / LPF: NONE SEEN
pH: 5 (ref 5.0–8.0)

## 2016-10-25 LAB — PREPARE RBC (CROSSMATCH)

## 2016-10-25 LAB — TROPONIN I: TROPONIN I: 0.27 ng/mL — AB (ref <0.03)

## 2016-10-25 MED ORDER — SODIUM CHLORIDE 0.9 % IV SOLN
10.0000 mL/h | Freq: Once | INTRAVENOUS | Status: AC
  Administered 2016-10-25: 10 mL/h via INTRAVENOUS

## 2016-10-25 MED ORDER — ONDANSETRON HCL 4 MG PO TABS: 4.0000 mg | ORAL_TABLET | Freq: Three times a day (TID) | ORAL | 1 refills | Status: DC | PRN

## 2016-10-25 MED ORDER — SODIUM CHLORIDE 0.9 % IV BOLUS (SEPSIS)
500.0000 mL | Freq: Once | INTRAVENOUS | Status: AC
  Administered 2016-10-25: 500 mL via INTRAVENOUS

## 2016-10-25 NOTE — Telephone Encounter (Signed)
-----   Message from [name], MD sent at 10/25/2016 12:31 PM EDT ----- Needs wheelchair and rolling walker orders please.

## 2016-10-25 NOTE — ED Triage Notes (Signed)
Pt in by ems for generalized weakness all day today.  Pt states she saw dr Stanton Kidney nelson this morning for some tests due to same but she states she just isn't feeling any better this afternoon and is also now running a fever.  Pt's temp on scene was 101.1 per ems and she was given 1 gram of tylenol for same approx 30 minutes ago. Pt denies pain

## 2016-10-25 NOTE — ED Notes (Signed)
Pt unable to stand for orthostatics  

## 2016-10-25 NOTE — ED Provider Notes (Signed)
[facility] DEPT Provider Note   CSN: [id number] Arrival date & time: 10/25/16  1951     History   Chief Complaint Chief Complaint  Patient presents with  . Fatigue    HPI [name] is a 76 y.o. female.     Pt was seen at 2030. Per EMS, pt and her husband, c/o gradual onset and worsening of persistent generalized weakness/fatigue for the past several days, worse today. Pt's husband states pt was "took weak to stand up." Pt endorses black stools. Pt was evaluated by her PMD this morning and "had labs done" but pt and spouse do not know the results. EMS states pt had temp on scene of "101.1" and was given APAP approximately 42min PTA. Denies CP/palpitations, no SOB/cough, no back pain, no abd pain, no N/V/D, no blood in stools.   Past Medical History:  Diagnosis Date  . Anemia   . Arthritis   . Blood transfusion without reported diagnosis   . Cataract   . Chronic anemia   . Chronic combined systolic and diastolic CHF (congestive heart failure) ([place])    a. 2D echo 08/2016 at [facility]: EF 50-55% with inferior wall HK, impaired LV filling, fair study.  . CKD (chronic kidney disease), stage III   . Diabetes ([place])   . Gastritis   . GERD (gastroesophageal reflux disease)   . Gout   . HTN (hypertension)   . Hyperlipidemia   . NSTEMI (non-ST elevated myocardial infarction) ([place])    a. Complex admission 08/2016 - with severe hyperglycemia, AKI on CKD, severe anemia down to Hgb 6.8, acute combined CHF, troponin of 8.5, cath deferred due to renal dysfunction.  . Thyroid disease     Patient Active Problem List   Diagnosis Date Noted  . Aortic atherosclerosis ([place]) 10/25/2016  . Symptomatic anemia 10/04/2016  . Acute kidney injury superimposed on CKD ([place]) 10/04/2016  . History of non-ST elevation myocardial infarction (NSTEMI) 10/04/2016  . Hyperlipidemia 10/04/2016  . Neuropathy 09/20/2016  . Chronic kidney disease, stage IV (severe) ([place]) 08/03/2015  . Renal insufficiency  02/25/2015  . Hip fracture ([place]) 02/05/2015  . DM (diabetes mellitus) ([place]) 02/05/2015  . HTN (hypertension) 02/05/2015  . Gout 02/05/2015  . Left humeral fracture 02/05/2015  . Hyperlipidemia associated with type 2 diabetes mellitus ([place]) 05/01/2013    Past Surgical History:  Procedure Laterality Date  . COLONOSCOPY WITH PROPOFOL N/A 10/08/2016   Procedure: COLONOSCOPY WITH PROPOFOL;  Surgeon: Ronnette Juniper, MD;  Location: [facility];  Service: Gastroenterology;  Laterality: N/A;  . ESOPHAGOGASTRODUODENOSCOPY N/A 10/06/2016   Procedure: ESOPHAGOGASTRODUODENOSCOPY (EGD);  Surgeon: Otis Brace, MD;  Location: [facility] ENDOSCOPY;  Service: Gastroenterology;  Laterality: N/A;  . GIVENS CAPSULE STUDY  10/08/2016   Procedure: GIVENS CAPSULE STUDY;  Surgeon: Ronnette Juniper, MD;  Location: [place];  Service: Gastroenterology;;  . ORIF HUMERUS FRACTURE Left 02/07/2015   Procedure: OPEN REDUCTION INTERNAL FIXATION (ORIF) PROXIMAL HUMERUS FRACTURE;  Surgeon: Marybelle Killings, MD;  Location: [place];  Service: Orthopedics;  Laterality: Left;  . THYROID SURGERY    . TOTAL HIP ARTHROPLASTY Left 02/07/2015   Procedure: TOTAL HIP ARTHROPLASTY ANTERIOR APPROACH ;  Surgeon: Marybelle Killings, MD;  Location: [facility];  Service: Orthopedics;  Laterality: Left;  . WRIST SURGERY Left     OB History    No data available       Home Medications    Prior to Admission medications   Medication Sig Start Date End Date Taking? Authorizing Provider  amLODipine (NORVASC) 10 MG tablet Take 10 mg by mouth daily. 07/30/16   [provider]  aspirin EC 81 MG tablet Take 81 mg by mouth daily. 09/21/16   [provider]  clopidogrel (PLAVIX) 75 MG tablet Take 75 mg by mouth daily.    [provider]  furosemide (LASIX) 20 MG tablet Take 20 mg by mouth as needed. Only has to take it when she gains 2 lbs 09/20/16   [provider]  gabapentin (NEURONTIN) 300 MG capsule Take 1 capsule by mouth 2 (two)  times daily.  01/04/15   [provider]  glipiZIDE (GLUCOTROL) 10 MG tablet Take 1 tablet by mouth 2 (two) times daily. 01/22/15   [provider]  levothyroxine (SYNTHROID, LEVOTHROID) 25 MCG tablet Take 25 mcg by mouth daily before breakfast.    [provider]  metoprolol tartrate (LOPRESSOR) 25 MG tablet Take 12.5 mg by mouth 2 (two) times daily.    [provider]  Multiple Vitamin (MULTIVITAMIN WITH MINERALS) TABS tablet Take 1 tablet by mouth daily.    [provider]  ondansetron (ZOFRAN) 4 MG tablet Take 1 tablet (4 mg total) by mouth every 8 (eight) hours as needed for nausea or vomiting. 10/25/16   [name], MD  pantoprazole (PROTONIX) 40 MG tablet Take 40 mg by mouth daily.    [provider]  ULORIC 40 MG tablet Take 1 tablet by mouth daily. 12/21/14   [provider]    Family History Family History  Problem Relation Age of Onset  . Diabetes Mother   . Asthma Mother   . Early death Mother 38       pneumonia  . Early death Father        killed at rode[place]  . CAD Neg Hx   . GI Bleed Neg Hx     Social History Social History  Substance Use Topics  . Smoking status: Never Smoker  . Smokeless tobacco: Never Used  . Alcohol use No     Allergies   Insulin glargine and Statins   Review of Systems Review of Systems ROS: Statement: All systems negative except as marked or noted in the HPI; Constitutional: Negative for chills. +generalized weakness/fatigue. ; ; Eyes: Negative for eye pain, redness and discharge. ; ; ENMT: Negative for ear pain, hoarseness, nasal congestion, sinus pressure and sore throat. ; ; Cardiovascular: Negative for chest pain, palpitations, diaphoresis, dyspnea and peripheral edema. ; ; Respiratory: Negative for cough, wheezing and stridor. ; ; Gastrointestinal: +black stools. Negative for nausea, vomiting, diarrhea, abdominal pain, blood in stool, hematemesis, jaundice and rectal  bleeding. . ; ; Genitourinary: Negative for dysuria, flank pain and hematuria. ; ; Musculoskeletal: Negative for back pain and neck pain. Negative for swelling and trauma.; ; Skin: Negative for pruritus, rash, abrasions, blisters, bruising and skin lesion.; ; Neuro: Negative for headache, lightheadedness and neck stiffness. Negative for altered level of consciousness, altered mental status, extremity weakness, paresthesias, involuntary movement, seizure and syncope.      Physical Exam Updated Vital Signs BP (!) 104/52   Pulse 89   Temp 98.3 F (36.8 C) (Oral)   Resp 18   Ht 5\' 4"  (1.626 m)   Wt 85.3 kg (188 lb)   SpO2 95%   BMI 32.27 kg/m   21:14 Orthostatic Vital Signs VB  Orthostatic Lying   BP- Lying: 112/50  Pulse- Lying: 93      Orthostatic Sitting  BP- Sitting:  121/99  Pulse- Sitting: 97   Pt unable to stand for orthostatic VS  Patient Vitals for the past 24 hrs:  BP Temp Temp src Pulse Resp SpO2 Height Weight  10/25/16 2307 - 98 F (36.7 C) Oral 87 18 95 % - -  10/25/16 2300 (!) 104/52 - - 89 - 95 % - -  10/25/16 2247 (!) 113/52 98.3 F (36.8 C) Oral 83 18 - - -  10/25/16 2245 - - - 84 - 95 % - -  10/25/16 2230 (!) 113/52 - - 87 - 95 % - -  10/25/16 2215 - - - 86 - 95 % - -  10/25/16 2145 - - - 88 - 96 % - -  10/25/16 2130 (!) 113/49 - - 89 - 92 % - -  10/25/16 2000 - - - - - - 5\' 4"  (1.626 m) 85.3 kg (188 lb)  10/25/16 1959 (!) 110/43 98.8 F (37.1 C) Oral 89 18 96 % - -     Physical Exam 2035: Physical examination:  Nursing notes reviewed; Vital signs and O2 SAT reviewed;  Constitutional: Well developed, Well nourished, In no acute distress; Head:  Normocephalic, atraumatic; Eyes: EOMI, PERRL, No scleral icterus. Conjunctiva pale.; ENMT: Mouth and pharynx normal, Mucous membranes dry; Neck: Supple, Full range of motion, No lymphadenopathy; Cardiovascular: Regular rate and rhythm, No gallop; Respiratory: Breath sounds clear & equal bilaterally, No wheezes.   Speaking full sentences with ease, Normal respiratory effort/excursion; Chest: Nontender, Movement normal; Abdomen: Soft, Nontender, Nondistended, Normal bowel sounds. Rectal exam performed w/permission of pt and ED RN chaperone present.  Anal tone normal.  Non-tender, soft black stool in rectal vault, heme positive.  No fissures, no external hemorrhoids, no palp masses.;;; Genitourinary: No CVA tenderness; Extremities: Pulses normal, No tenderness, No edema, No calf edema or asymmetry.; Neuro: AA&Ox3, Major CN grossly intact.  Speech clear. No gross focal motor or sensory deficits in extremities.; Skin: Color pale, Warm, Dry.   ED Treatments / Results  Labs (all labs ordered are listed, but only abnormal results are displayed)   EKG  EKG Interpretation  Date/Time:  Thursday Oct 25 2016 21:12:32 EDT Ventricular Rate:  95 PR Interval:    QRS Duration: 92 QT Interval:  369 QTC Calculation: 464 R Axis:   19 Text Interpretation:  Sinus rhythm Abnormal R-wave progression, early transition Borderline ST depression, anterolateral leads When compared with ECG of 10/04/2016 Nonspecific ST and T wave abnormality is more pronounced Confirmed by [facility]  MD, Nunzio Cory [phone]) on 10/25/2016 10:18:17 PM       Radiology   Procedures Procedures (including critical care time)  Medications Ordered in ED Medications  0.9 %  sodium chloride infusion (10 mL/hr Intravenous New Bag/Given 10/25/16 2257)  sodium chloride 0.9 % bolus 500 mL (500 mLs Intravenous New Bag/Given 10/25/16 2257)     Initial Impression / Assessment and Plan / ED Course  I have reviewed the triage vital signs and the nursing notes.  Pertinent labs & imaging results that were available during my care of the patient were reviewed by me and considered in my medical decision making (see chart for details).  MDM Reviewed: previous chart, nursing note and vitals Reviewed previous: labs and ECG Interpretation: labs, ECG, x-ray and CT  scan Total time providing critical care: 30-74 minutes. This excludes time spent performing separately reportable procedures and services. Consults: admitting MD and gastrointestinal    CRITICAL CARE Performed by: Alfonzo Feller Total critical care time: 35 minutes Critical care  time was exclusive of separately billable procedures and treating other patients. Critical care was necessary to treat or prevent imminent or life-threatening deterioration. Critical care was time spent personally by me on the following activities: development of treatment plan with patient and/or surrogate as well as nursing, discussions with consultants, evaluation of patient's response to treatment, examination of patient, obtaining history from patient or surrogate, ordering and performing treatments and interventions, ordering and review of laboratory studies, ordering and review of radiographic studies, pulse oximetry and re-evaluation of patient's condition.   Results for orders placed or performed during the hospital encounter of 10/25/16  Culture, blood (routine x 2)  Result Value Ref Range   Specimen Description LEFT ANTECUBITAL    Special Requests      BOTTLES DRAWN AEROBIC AND ANAEROBIC Blood Culture results may not be optimal due to an inadequate volume of blood received in culture bottles   Culture PENDING    Report Status PENDING   Culture, blood (routine x 2)  Result Value Ref Range   Specimen Description BLOOD LEFT HAND    Special Requests      BOTTLES DRAWN AEROBIC AND ANAEROBIC Blood Culture adequate volume   Culture PENDING    Report Status PENDING   Urinalysis, Routine w reflex microscopic  Result Value Ref Range   Color, Urine YELLOW YELLOW   APPearance HAZY (A) CLEAR   Specific Gravity, Urine 1.013 1.005 - 1.030   pH 5.0 5.0 - 8.0   Glucose, UA 50 (A) NEGATIVE mg/dL   Hgb urine dipstick NEGATIVE NEGATIVE   Bilirubin Urine NEGATIVE NEGATIVE   Ketones, ur NEGATIVE NEGATIVE mg/dL     Protein, ur 30 (A) NEGATIVE mg/dL   Nitrite NEGATIVE NEGATIVE   Leukocytes, UA NEGATIVE NEGATIVE   RBC / HPF NONE SEEN 0 - 5 RBC/hpf   WBC, UA 0-5 0 - 5 WBC/hpf   Bacteria, UA RARE (A) NONE SEEN   Squamous Epithelial / LPF NONE SEEN NONE SEEN   Mucous PRESENT   Comprehensive metabolic panel  Result Value Ref Range   Sodium 138 135 - 145 mmol/L   Potassium 5.1 3.5 - 5.1 mmol/L   Chloride 106 101 - 111 mmol/L   CO2 20 (L) 22 - 32 mmol/L   Glucose, Bld 229 (H) 65 - 99 mg/dL   BUN 60 (H) 6 - 20 mg/dL   Creatinine, Ser 2.76 (H) 0.44 - 1.00 mg/dL   Calcium 7.9 (L) 8.9 - 10.3 mg/dL   Total Protein 5.7 (L) 6.5 - 8.1 g/dL   Albumin 2.8 (L) 3.5 - 5.0 g/dL   AST 36 15 - 41 U/L   ALT 21 14 - 54 U/L   Alkaline Phosphatase 41 38 - 126 U/L   Total Bilirubin 0.5 0.3 - 1.2 mg/dL   GFR calc non Af Amer 16 (L) >60 mL/min   GFR calc Af Amer 18 (L) >60 mL/min   Anion gap 12 5 - 15  Troponin I  Result Value Ref Range   Troponin I 0.27 (HH) <0.03 ng/mL  Lactic acid, plasma  Result Value Ref Range   Lactic Acid, Venous 3.6 (HH) 0.5 - 1.9 mmol/L  CBC with Differential  Result Value Ref Range   WBC 9.5 4.0 - 10.5 K/uL   RBC 1.37 (L) 3.87 - 5.11 MIL/uL   Hemoglobin 3.8 (LL) 12.0 - 15.0 g/dL   HCT 12.6 (L) 36.0 - 46.0 %   MCV 92.0 78.0 - 100.0 fL   MCH 27.7 26.0 -  34.0 pg   MCHC 30.2 30.0 - 36.0 g/dL   RDW 17.0 (H) 11.5 - 15.5 %   Platelets 316 150 - 400 K/uL   Neutrophils Relative % 83 %   Neutro Abs 7.9 (H) 1.7 - 7.7 K/uL   Lymphocytes Relative 12 %   Lymphs Abs 1.1 0.7 - 4.0 K/uL   Monocytes Relative 4 %   Monocytes Absolute 0.4 0.1 - 1.0 K/uL   Eosinophils Relative 0 %   Eosinophils Absolute 0.0 0.0 - 0.7 K/uL   Basophils Relative 0 %   Basophils Absolute 0.0 0.0 - 0.1 K/uL  Type and screen  Result Value Ref Range   ABO/RH(D) B POS    Antibody Screen NEG    Sample Expiration 10/28/2016    Unit Number [id number]    Blood Component Type RED CELLS,LR    Unit division 00     Status of Unit ALLOCATED    Transfusion Status OK TO TRANSFUSE    Crossmatch Result Compatible    Unit Number [id number]    Blood Component Type RED CELLS,LR    Unit division 00    Status of Unit ISSUED    Transfusion Status OK TO TRANSFUSE    Crossmatch Result Compatible    Unit Number [id number]    Blood Component Type RED CELLS,LR    Unit division 00    Status of Unit ALLOCATED    Transfusion Status OK TO TRANSFUSE    Crossmatch Result Compatible    Unit Number [id number]    Blood Component Type RBC LR PHER1    Unit division 00    Status of Unit ALLOCATED    Transfusion Status OK TO TRANSFUSE    Crossmatch Result Compatible   Prepare RBC  Result Value Ref Range   Order Confirmation ORDER PROCESSED BY BLOOD BANK   BPAM RBC  Result Value Ref Range   Blood Product Unit Number [id number]    Unit Type and Rh 1700    Blood Product Expiration Date 619509326712    ISSUE DATE / TIME 458099833825    Blood Product Unit Number [id number]    PRODUCT CODE [id number]    Unit Type and Rh 1700    Blood Product Expiration Date 532992426834    Blood Product Unit Number [id number]    Unit Type and Rh 5100    Blood Product Expiration Date 194174081448    Blood Product Unit Number [id number]    Unit Type and Rh 5100    Blood Product Expiration Date 785885027741    Dg Chest 2 View Result Date: 10/25/2016 CLINICAL DATA:  Weakness and fever EXAM: CHEST  2 VIEW COMPARISON:  09/17/2016 FINDINGS: Improved aeration since the prior study. Minimal streaky atelectasis or infiltrate at the left base. No large effusion. Stable cardiomegaly with mild central vascular congestion. No pneumothorax. Multiple surgical screws partially visualized in the proximal left humerus. IMPRESSION: 1. Improved aeration of right lung base since the prior study 2. Minimal streaky atelectasis versus mild infiltrate left lung base 3. Stable cardiomegaly with mild central vascular congestion Electronically  Signed   By: Donavan Foil M.D.   On: 10/25/2016 22:16   Ct Head Wo Contrast Result Date: 10/25/2016 CLINICAL DATA:  76 year old female with generalized weakness today. Fever of 101 F. EXAM: CT HEAD WITHOUT CONTRAST TECHNIQUE: Contiguous axial images were obtained from the base of the skull through the vertex without intravenous contrast. COMPARISON:  None. FINDINGS: Brain: Cerebral volume is within normal limits for age. Mild for age  hypodensity in the right corona radiata. Other gray -white matter differentiation is within normal limits throughout the brain. No midline shift, ventriculomegaly, mass effect, evidence of mass lesion, intracranial hemorrhage or evidence of cortically based acute infarction. Vascular: Calcified atherosclerosis at the skull base. Skull: Hyperostosis of the calvarium, normal variant. No acute osseous abnormality identified. Sinuses/Orbits: Mild mostly right maxillary sinus mucosal thickening. No fluid levels or bubbly opacity. Tympanic cavities and mastoids are clear. Other: Postoperative changes to the left globe. Otherwise negative visualized noncontrast orbit, scalp, and face soft tissues. IMPRESSION: 1. Mild for age nonspecific white matter hypodensity in the right corona radiata, such as due to age indeterminate small vessel disease. Otherwise normal for age noncontrast CT appearance of the brain. 2. Mild maxillary sinus inflammation, significance doubtful. Electronically Signed   By: Genevie Ann M.D.   On: 10/25/2016 21:59    2245:  Pt unable to stand for orthostatic VS.  H/H lower than previous; PRBC's 2 units starting to transfuse. No fevers while in ED; doubt sepsis. BUN/Cr elevated per baseline. Troponin elevated with NS STTW changes on EKG, and pt denies CP; likely demand. Dx and testing d/w pt and family.  Questions answered.  Verb understanding, agreeable to admit.  T/C to [facility] Dr. Darrick Meigs, case discussed, including:  HPI, pertinent PM/SHx, VS/PE, dx testing, ED course and  treatment:  Agreeable to admit, requests to consult GI MD. T/C to GI Dr. Gala Romney, case discussed, including:  HPI, pertinent PM/SHx, VS/PE, dx testing, ED course and treatment:  Agreeable to consult tomorrow morning.      Final Clinical Impressions(s) / ED Diagnoses   Final diagnoses:  Fatigue, unspecified type  Melena  Symptomatic anemia  Elevated troponin    New Prescriptions New Prescriptions   No medications on file      Francine Graven, DO 10/26/16 2353

## 2016-10-25 NOTE — Patient Instructions (Addendum)
Stop hydralazine Follow BP If it stays low, may cut amlodipine in half  Continue to drink water Eat balanced diabetic diet  I am referring you to nephrology I am referring you to endocrinology  Need old records PCP in [place]  Blood work today  I will order rolling walker and wheelchair  See me in 2 weeks  Take zofran as needed nausea

## 2016-10-25 NOTE — Progress Notes (Signed)
Chief Complaint  Patient presents with  . Hypertension   Moved to [place] from [facility] a month ago Has been hospitalized twice since then First on Feb 24, she  had hypoxic respiratory failure, CHF, NSTEMI and was in [facility].  Second on May 10 she had melena, severe anemia (hgb6) and GI bleed.  Her EGD and colo and a pill study revealed a gastritis.  She is on protonix.  To see GI medicine in follow up.  She required 6 u PRBC, and left hosptial with a HGB of 8. She is a poorly controlled diabetic at baseline with an A1c of over 10 She aslo has ESRD with her last GFR measuring 17.  This very sick lady is here to establish with this primary care.  She needs local Cardiology, Endocrinology, Nephrology, and Gastroenterology ASAP.  She feels poorly.  Very weak.  Nauseated.  Off balance.  hypotensive with orthostatic dizziness.  Her BP is low.  She is here with her female friend and caregiver [name].    Patient Active Problem List   Diagnosis Date Noted  . Aortic atherosclerosis ([place]) 10/25/2016  . Symptomatic anemia 10/04/2016  . Acute kidney injury superimposed on CKD ([place]) 10/04/2016  . History of non-ST elevation myocardial infarction (NSTEMI) 10/04/2016  . Hyperlipidemia 10/04/2016  . Neuropathy 09/20/2016  . Chronic kidney disease, stage IV (severe) ([place]) 08/03/2015  . Renal insufficiency 02/25/2015  . Hip fracture ([place]) 02/05/2015  . DM (diabetes mellitus) ([place]) 02/05/2015  . HTN (hypertension) 02/05/2015  . Gout 02/05/2015  . Left humeral fracture 02/05/2015  . Hyperlipidemia associated with type 2 diabetes mellitus ([place]) 05/01/2013    Outpatient Encounter Prescriptions as of 10/25/2016  Medication Sig  . amLODipine (NORVASC) 10 MG tablet Take 10 mg by mouth daily.  Marland Kitchen aspirin EC 81 MG tablet Take 81 mg by mouth daily.  . clopidogrel (PLAVIX) 75 MG tablet Take 75 mg by mouth daily.  . furosemide (LASIX) 20 MG tablet Take 20 mg by mouth as needed. Only has to take it when she  gains 2 lbs  . gabapentin (NEURONTIN) 300 MG capsule Take 1 capsule by mouth 2 (two) times daily.   Marland Kitchen glipiZIDE (GLUCOTROL) 10 MG tablet Take 1 tablet by mouth 2 (two) times daily.  Marland Kitchen levothyroxine (SYNTHROID, LEVOTHROID) 25 MCG tablet Take 25 mcg by mouth daily before breakfast.  . metoprolol tartrate (LOPRESSOR) 25 MG tablet Take 12.5 mg by mouth 2 (two) times daily.  . Multiple Vitamin (MULTIVITAMIN WITH MINERALS) TABS tablet Take 1 tablet by mouth daily.  . pantoprazole (PROTONIX) 40 MG tablet Take 40 mg by mouth daily.  Marland Kitchen ULORIC 40 MG tablet Take 1 tablet by mouth daily.  . [DISCONTINUED] hydrALAZINE (APRESOLINE) 25 MG tablet Take 25 mg by mouth 2 (two) times daily.  . ondansetron (ZOFRAN) 4 MG tablet Take 1 tablet (4 mg total) by mouth every 8 (eight) hours as needed for nausea or vomiting.   No facility-administered encounter medications on file as of 10/25/2016.     Past Medical History:  Diagnosis Date  . Anemia   . Arthritis   . Blood transfusion without reported diagnosis   . Cataract   . Chronic anemia   . Chronic combined systolic and diastolic CHF (congestive heart failure) ([place])    a. 2D echo 08/2016 at [facility]: EF 50-55% with inferior wall HK, impaired LV filling, fair study.  . CKD (chronic kidney disease), stage III   . Diabetes ([place])   . GERD (  gastroesophageal reflux disease)   . Gout   . HTN (hypertension)   . Hyperlipidemia   . NSTEMI (non-ST elevated myocardial infarction) ([place])    a. Complex admission 08/2016 - with severe hyperglycemia, AKI on CKD, severe anemia down to Hgb 6.8, acute combined CHF, troponin of 8.5, cath deferred due to renal dysfunction.  . Thyroid disease     Past Surgical History:  Procedure Laterality Date  . COLONOSCOPY WITH PROPOFOL N/A 10/08/2016   Procedure: COLONOSCOPY WITH PROPOFOL;  Surgeon: Ronnette Juniper, MD;  Location: [place];  Service: Gastroenterology;  Laterality: N/A;  . ESOPHAGOGASTRODUODENOSCOPY N/A 10/06/2016   Procedure:  ESOPHAGOGASTRODUODENOSCOPY (EGD);  Surgeon: Otis Brace, MD;  Location: [facility] ENDOSCOPY;  Service: Gastroenterology;  Laterality: N/A;  . GIVENS CAPSULE STUDY  10/08/2016   Procedure: GIVENS CAPSULE STUDY;  Surgeon: Ronnette Juniper, MD;  Location: [place];  Service: Gastroenterology;;  . ORIF HUMERUS FRACTURE Left 02/07/2015   Procedure: OPEN REDUCTION INTERNAL FIXATION (ORIF) PROXIMAL HUMERUS FRACTURE;  Surgeon: Marybelle Killings, MD;  Location: [place];  Service: Orthopedics;  Laterality: Left;  . THYROID SURGERY    . TOTAL HIP ARTHROPLASTY Left 02/07/2015   Procedure: TOTAL HIP ARTHROPLASTY ANTERIOR APPROACH ;  Surgeon: Marybelle Killings, MD;  Location: [place];  Service: Orthopedics;  Laterality: Left;  . WRIST SURGERY Left     Social History   Social History  . Marital status: Soil scientist    Spouse name: [name]  . Number of children: 2  . Years of education: 12   Occupational History  . retired    Social History Main Topics  . Smoking status: Never Smoker  . Smokeless tobacco: Never Used  . Alcohol use No  . Drug use: No  . Sexual activity: Not on file   Other Topics Concern  . Not on file   Social History Narrative   Lives with significant other/partner [name]   He is her caregiver   M[facility]    Family History  Problem Relation Age of Onset  . Diabetes Mother   . Asthma Mother   . Early death Mother 4       pneumonia  . Early death Father        killed at rodeo  . CAD Neg Hx   . GI Bleed Neg Hx     Review of Systems  Constitutional: Positive for malaise/fatigue.  HENT: Positive for hearing loss and tinnitus. Negative for congestion and sinus pain.        Sounds in L ear  Eyes: Negative for blurred vision and discharge.  Respiratory: Negative for shortness of breath and wheezing.   Cardiovascular: Positive for leg swelling. Negative for chest pain and palpitations.  Gastrointestinal: Negative for blood in stool, constipation and diarrhea.  Genitourinary:  Negative for dysuria and frequency.  Musculoskeletal: Positive for neck pain.  Skin: Negative for itching and rash.  Neurological: Positive for dizziness and weakness. Negative for focal weakness.  Endo/Heme/Allergies: Bruises/bleeds easily.  Psychiatric/Behavioral: Negative for depression. The patient is not nervous/anxious.     BP (!) 114/56 (BP Location: Left Arm, Patient Position: Sitting, Cuff Size: Normal)   Pulse 96   Temp 97.9 F (36.6 C) (Temporal)   Resp 16   Ht 5\' 4"  (1.626 m)   Wt 188 lb 1.9 oz (85.3 kg)   SpO2 95%   BMI 32.29 kg/m   Physical Exam  Constitutional: She is oriented to person, place, and time. She appears well-developed and well-nourished. She  appears distressed.  Appears ill.  Pale.  Weak.  In wheelchair  HENT:  Head: Normocephalic and atraumatic.  Right Ear: External ear normal.  Left Ear: External ear normal.  Mouth/Throat: Oropharynx is clear and moist.  Left TM Is clear  Eyes: EOM are normal. Pupils are equal, round, and reactive to light.  Neck: Normal range of motion. Neck supple.  Cardiovascular: Regular rhythm.   Murmur heard. Tachycardia, soft murmur  Pulmonary/Chest: Effort normal. She has rales.  Rales in bases  Abdominal: Soft. Bowel sounds are normal.  Musculoskeletal: Normal range of motion. She exhibits edema.  Symmetric extremities.  Trace edema  Lymphadenopathy:    She has no cervical adenopathy.  Neurological: She is alert and oriented to person, place, and time.  Skin: There is pallor.  Psychiatric: She has a normal mood and affect. Her behavior is normal.   ASSESSMENT/PLAN:  1. Acute kidney injury superimposed on CKD ([place])  - COMPLETE METABOLIC PANEL WITH GFR - CBC with Differential/Platelet - Ambulatory referral to Nephrology  2. Type 2 diabetes mellitus with stage 5 chronic kidney disease not on chronic dialysis, without long-term current use of insulin (H[facility])  - COMPLETE METABOLIC PANEL WITH GFR - CBC with  Differential/Platelet - Ambulatory referral to Endocrinology  3. Acute gastritis with hemorrhage, unspecified gastritis type - Ambulatory referral to Gastroenterology 4. Aortic atherosclerosis 5. Anemia due to GI bleed 6. Weakness, generalized I [name] certify that this patient is under my care and that I had a face-to-face encounter that meets the physician face-to-face encounter requirements with this patient on 10/25/2016. The encounter with the patient was in whole, or in part for the following medical condition(s) which is the primary reason for home health care (List medical condition): see above.  Needs wheelchair and walker for ambulation within the home and for medical visits.  Greater than 50% of this visit was spent in counseling and coordinating care.  Total face to face time:  60 min.  Much chart review and discussion of recent events, discussed home safety and needs, discussed referrals and compliance.   Patient Instructions  Stop hydralazine Follow BP If it stays low, may cut amlodipine in half  Continue to drink water Eat balanced diabetic diet  I am referring you to nephrology I am referring you to endocrinology  Need old records PCP in [place]  Blood work today  I will order rolling walker and wheelchair  See me in 2 weeks  Take zofran as needed nausea    [name], MD

## 2016-10-25 NOTE — ED Notes (Signed)
CRITICAL VALUE ALERT  Critical Value:  Hemoglobin 3.8  Date & Time Notied:  10/25/2016 @ 2129  Provider Notified: Dr. Thurnell Garbe  Orders Received/Actions taken: MD notified

## 2016-10-26 ENCOUNTER — Inpatient Hospital Stay (HOSPITAL_COMMUNITY): Payer: Medicare PPO

## 2016-10-26 ENCOUNTER — Telehealth: Payer: Self-pay | Admitting: Family Medicine

## 2016-10-26 ENCOUNTER — Encounter: Payer: Self-pay | Admitting: Gastroenterology

## 2016-10-26 ENCOUNTER — Encounter (HOSPITAL_COMMUNITY): Payer: Self-pay

## 2016-10-26 DIAGNOSIS — I5042 Chronic combined systolic (congestive) and diastolic (congestive) heart failure: Secondary | ICD-10-CM | POA: Diagnosis present

## 2016-10-26 DIAGNOSIS — K921 Melena: Secondary | ICD-10-CM | POA: Diagnosis present

## 2016-10-26 DIAGNOSIS — D500 Iron deficiency anemia secondary to blood loss (chronic): Secondary | ICD-10-CM | POA: Diagnosis not present

## 2016-10-26 DIAGNOSIS — J810 Acute pulmonary edema: Secondary | ICD-10-CM | POA: Diagnosis not present

## 2016-10-26 DIAGNOSIS — R778 Other specified abnormalities of plasma proteins: Secondary | ICD-10-CM

## 2016-10-26 DIAGNOSIS — I2510 Atherosclerotic heart disease of native coronary artery without angina pectoris: Secondary | ICD-10-CM | POA: Diagnosis present

## 2016-10-26 DIAGNOSIS — N189 Chronic kidney disease, unspecified: Secondary | ICD-10-CM | POA: Diagnosis not present

## 2016-10-26 DIAGNOSIS — R748 Abnormal levels of other serum enzymes: Secondary | ICD-10-CM | POA: Diagnosis not present

## 2016-10-26 DIAGNOSIS — N183 Chronic kidney disease, stage 3 (moderate): Secondary | ICD-10-CM | POA: Diagnosis present

## 2016-10-26 DIAGNOSIS — E039 Hypothyroidism, unspecified: Secondary | ICD-10-CM | POA: Diagnosis present

## 2016-10-26 DIAGNOSIS — K5641 Fecal impaction: Secondary | ICD-10-CM | POA: Diagnosis present

## 2016-10-26 DIAGNOSIS — R5383 Other fatigue: Secondary | ICD-10-CM

## 2016-10-26 DIAGNOSIS — Z888 Allergy status to other drugs, medicaments and biological substances status: Secondary | ICD-10-CM | POA: Diagnosis not present

## 2016-10-26 DIAGNOSIS — I959 Hypotension, unspecified: Secondary | ICD-10-CM | POA: Diagnosis not present

## 2016-10-26 DIAGNOSIS — D649 Anemia, unspecified: Secondary | ICD-10-CM | POA: Diagnosis not present

## 2016-10-26 DIAGNOSIS — M109 Gout, unspecified: Secondary | ICD-10-CM | POA: Diagnosis present

## 2016-10-26 DIAGNOSIS — I252 Old myocardial infarction: Secondary | ICD-10-CM | POA: Diagnosis not present

## 2016-10-26 DIAGNOSIS — N179 Acute kidney failure, unspecified: Secondary | ICD-10-CM | POA: Diagnosis not present

## 2016-10-26 DIAGNOSIS — Z833 Family history of diabetes mellitus: Secondary | ICD-10-CM | POA: Diagnosis not present

## 2016-10-26 DIAGNOSIS — K2950 Unspecified chronic gastritis without bleeding: Secondary | ICD-10-CM | POA: Diagnosis present

## 2016-10-26 DIAGNOSIS — Z96642 Presence of left artificial hip joint: Secondary | ICD-10-CM | POA: Diagnosis present

## 2016-10-26 DIAGNOSIS — D62 Acute posthemorrhagic anemia: Secondary | ICD-10-CM | POA: Diagnosis present

## 2016-10-26 DIAGNOSIS — I700 Atherosclerosis of aorta: Secondary | ICD-10-CM | POA: Diagnosis present

## 2016-10-26 DIAGNOSIS — Z7902 Long term (current) use of antithrombotics/antiplatelets: Secondary | ICD-10-CM | POA: Diagnosis not present

## 2016-10-26 DIAGNOSIS — Z7984 Long term (current) use of oral hypoglycemic drugs: Secondary | ICD-10-CM | POA: Diagnosis not present

## 2016-10-26 DIAGNOSIS — Z7982 Long term (current) use of aspirin: Secondary | ICD-10-CM | POA: Diagnosis not present

## 2016-10-26 DIAGNOSIS — R7989 Other specified abnormal findings of blood chemistry: Secondary | ICD-10-CM

## 2016-10-26 DIAGNOSIS — I130 Hypertensive heart and chronic kidney disease with heart failure and stage 1 through stage 4 chronic kidney disease, or unspecified chronic kidney disease: Secondary | ICD-10-CM | POA: Diagnosis present

## 2016-10-26 DIAGNOSIS — E1122 Type 2 diabetes mellitus with diabetic chronic kidney disease: Secondary | ICD-10-CM | POA: Diagnosis present

## 2016-10-26 DIAGNOSIS — E785 Hyperlipidemia, unspecified: Secondary | ICD-10-CM | POA: Diagnosis present

## 2016-10-26 DIAGNOSIS — Z79899 Other long term (current) drug therapy: Secondary | ICD-10-CM | POA: Diagnosis not present

## 2016-10-26 DIAGNOSIS — K219 Gastro-esophageal reflux disease without esophagitis: Secondary | ICD-10-CM | POA: Diagnosis present

## 2016-10-26 LAB — TROPONIN I
TROPONIN I: 1.6 ng/mL — AB (ref <0.03)
Troponin I: 1.8 ng/mL (ref <0.03)
Troponin I: 1.36 ng/mL (ref <0.03)

## 2016-10-26 LAB — HEMOGLOBIN AND HEMATOCRIT, BLOOD
HEMATOCRIT: 26.6 % — AB (ref 36.0–46.0)
Hemoglobin: 8.9 g/dL — ABNORMAL LOW (ref 12.0–15.0)

## 2016-10-26 LAB — COMPREHENSIVE METABOLIC PANEL
ALBUMIN: 2.9 g/dL — AB (ref 3.5–5.0)
ALK PHOS: 40 U/L (ref 38–126)
ALT: 23 U/L (ref 14–54)
AST: 38 U/L (ref 15–41)
Anion gap: 10 (ref 5–15)
BUN: 58 mg/dL — AB (ref 6–20)
CALCIUM: 7.9 mg/dL — AB (ref 8.9–10.3)
CHLORIDE: 106 mmol/L (ref 101–111)
CO2: 23 mmol/L (ref 22–32)
CREATININE: 2.73 mg/dL — AB (ref 0.44–1.00)
GFR calc Af Amer: 18 mL/min — ABNORMAL LOW (ref >60)
GFR calc non Af Amer: 16 mL/min — ABNORMAL LOW (ref >60)
GLUCOSE: 164 mg/dL — AB (ref 65–99)
Potassium: 4.4 mmol/L (ref 3.5–5.1)
SODIUM: 139 mmol/L (ref 135–145)
Total Bilirubin: 0.7 mg/dL (ref 0.3–1.2)
Total Protein: 5.8 g/dL — ABNORMAL LOW (ref 6.5–8.1)

## 2016-10-26 LAB — CBC
HCT: 19.9 % — ABNORMAL LOW (ref 36.0–46.0)
HEMOGLOBIN: 6.3 g/dL — AB (ref 12.0–15.0)
MCH: 27.9 pg (ref 26.0–34.0)
MCHC: 31.7 g/dL (ref 30.0–36.0)
MCV: 88.1 fL (ref 78.0–100.0)
PLATELETS: 342 10*3/uL (ref 150–400)
RBC: 2.26 MIL/uL — AB (ref 3.87–5.11)
RDW: 16.4 % — ABNORMAL HIGH (ref 11.5–15.5)
WBC: 8.8 10*3/uL (ref 4.0–10.5)

## 2016-10-26 LAB — GLUCOSE, CAPILLARY
GLUCOSE-CAPILLARY: 241 mg/dL — AB (ref 65–99)
Glucose-Capillary: 135 mg/dL — ABNORMAL HIGH (ref 65–99)
Glucose-Capillary: 179 mg/dL — ABNORMAL HIGH (ref 65–99)
Glucose-Capillary: 161 mg/dL — ABNORMAL HIGH (ref 65–99)

## 2016-10-26 LAB — MRSA PCR SCREENING: MRSA by PCR: NEGATIVE

## 2016-10-26 LAB — PREPARE RBC (CROSSMATCH)

## 2016-10-26 LAB — OCCULT BLOOD X 1 CARD TO LAB, STOOL: FECAL OCCULT BLD: NEGATIVE

## 2016-10-26 MED ORDER — FUROSEMIDE 10 MG/ML IJ SOLN
40.0000 mg | Freq: Once | INTRAMUSCULAR | Status: AC
  Administered 2016-10-26: 40 mg via INTRAVENOUS
  Filled 2016-10-26: qty 4

## 2016-10-26 MED ORDER — ONDANSETRON HCL 4 MG/2ML IJ SOLN
4.0000 mg | Freq: Three times a day (TID) | INTRAMUSCULAR | Status: DC | PRN
  Administered 2016-10-26: 4 mg via INTRAVENOUS
  Filled 2016-10-26: qty 2

## 2016-10-26 MED ORDER — INSULIN ASPART 100 UNIT/ML ~~LOC~~ SOLN
0.0000 [IU] | Freq: Three times a day (TID) | SUBCUTANEOUS | Status: DC
  Administered 2016-10-26 (×2): 1 [IU] via SUBCUTANEOUS
  Administered 2016-10-26 – 2016-10-27 (×2): 2 [IU] via SUBCUTANEOUS
  Administered 2016-10-27: 3 [IU] via SUBCUTANEOUS
  Administered 2016-10-27: 2 [IU] via SUBCUTANEOUS
  Administered 2016-10-28: 3 [IU] via SUBCUTANEOUS
  Administered 2016-10-28: 1 [IU] via SUBCUTANEOUS
  Administered 2016-10-28: 2 [IU] via SUBCUTANEOUS
  Administered 2016-10-29: 5 [IU] via SUBCUTANEOUS
  Administered 2016-10-29: 2 [IU] via SUBCUTANEOUS
  Administered 2016-10-29: 1 [IU] via SUBCUTANEOUS

## 2016-10-26 MED ORDER — PANTOPRAZOLE SODIUM 40 MG IV SOLR
40.0000 mg | Freq: Two times a day (BID) | INTRAVENOUS | Status: DC
  Administered 2016-10-26 – 2016-10-29 (×7): 40 mg via INTRAVENOUS
  Filled 2016-10-26 (×7): qty 40

## 2016-10-26 MED ORDER — FERROUS SULFATE 325 (65 FE) MG PO TABS
325.0000 mg | ORAL_TABLET | Freq: Every day | ORAL | Status: DC
  Administered 2016-10-26 – 2016-10-28 (×3): 325 mg via ORAL
  Filled 2016-10-26 (×3): qty 1

## 2016-10-26 MED ORDER — SODIUM CHLORIDE 0.9 % IV SOLN: Freq: Once | INTRAVENOUS | Status: DC

## 2016-10-26 MED ORDER — ORAL CARE MOUTH RINSE
15.0000 mL | Freq: Two times a day (BID) | OROMUCOSAL | Status: DC
  Administered 2016-10-27 – 2016-10-29 (×6): 15 mL via OROMUCOSAL

## 2016-10-26 MED ORDER — SODIUM CHLORIDE 0.9 % IV SOLN
Freq: Once | INTRAVENOUS | Status: AC
  Administered 2016-10-26: 17:00:00 via INTRAVENOUS

## 2016-10-26 MED ORDER — ACETAMINOPHEN 325 MG PO TABS
650.0000 mg | ORAL_TABLET | Freq: Four times a day (QID) | ORAL | Status: DC | PRN
  Administered 2016-10-26 – 2016-10-27 (×3): 650 mg via ORAL
  Filled 2016-10-26 (×3): qty 2

## 2016-10-26 MED ORDER — LEVOTHYROXINE SODIUM 25 MCG PO TABS
25.0000 ug | ORAL_TABLET | Freq: Every day | ORAL | Status: DC
  Administered 2016-10-26 – 2016-10-30 (×5): 25 ug via ORAL
  Filled 2016-10-26 (×5): qty 1

## 2016-10-26 NOTE — Telephone Encounter (Signed)
Critical lab called in around 11: 30 pm by lab, hemoglobin of 4.4. I made a  call made to patient's home, I spoke with Baxter Kail, who informed me pt was at the hospital being transfused, which I subsequently confirmed on record review

## 2016-10-26 NOTE — Progress Notes (Signed)
Called by RN that patients troponin went up to 1.60, no chest pain. Likely from demand ischemia. Hemoglobin is up to 6.3 after 2 units PRBC. We will transfuse 2 more units. We'll consult cardiology in a.m. Aspirin Plavix currently on hold due to anemia from possible GI bleed. Stool for occult blood is pending.

## 2016-10-26 NOTE — Consult Note (Signed)
Referring Provider: No ref. provider found Primary Care Physician:  [name], MD Primary Gastroenterologist:  [facility]   Date of Admission: 10/25/16 Date of Consultation: 10/26/16  Reason for Consultation:  Severe anemia  HPI:  [name] is a 76 y.o. female with a past medical history of anemia, chronic CHF, CKD, gastritis, GERD, NSTEMI admission 09/18/16 at [facility] (complex admission with AKI, significant anemia hgb to 6.8, elevated troponins and cath on hold due to kidney function, started on Plavix and ASA). She presented to the ER via EMS unresponsive and, per husband, has had worsening fatigue and black stools; on iron. Found to have profound anemia in the ER (hgb 3.8, 4 units PRBCs ordered). She denies frank blood per rectub but admitted to shortness of breath. GI was consulted and serial H/H ordered, started on IV protonix.  She was readmitted for symptomatic anemia 10/04/16 - 10/09/16 at which point it was noted her last colonoscopy was 2014 and had never had an EGD. EGD performed 10/06/16 by Dr. Alessandra Bevels which found Z-linve regular, gastritis s/p biopsy (chronic gastritis without H. Pylori), and normal duodenum. Colonoscopy completed 10/08/16 found 15mm polyp transverse colon polypectomy not attempted due to anticoagulation, a few small mouth diverticula in the sigmoid colon, non-bleeding internal hemorrhoids. Recommend repeat colonoscopy at future date for polypectomy off anticoagulation. Planned capsule endoscopy per notes. This was completed 10/17/16 and found no mucosal abnormality in the small bowel.  Her labs show baseline kidney function (Cr 2.58), troponins positive 0.27 -> 1.60 (cardiology consult ordered), hgb 4.4 -> 3.8 -> 6.4.  Today she states she has had gradually worsening fatigue and DOE ("when walking across the room to go to the restroom.") Denies abdominal pain, N/V, hematochezia. Admits dark stools "but I'm on iron and it tend to constipate me." Denies syncope  and near syncope.   Past Medical History:  Diagnosis Date  . Anemia   . Arthritis   . Blood transfusion without reported diagnosis   . Cataract   . Chronic anemia   . Chronic combined systolic and diastolic CHF (congestive heart failure) ([place])    a. 2D echo 08/2016 at [facility]: EF 50-55% with inferior wall HK, impaired LV filling, fair study.  . CKD (chronic kidney disease), stage III   . Diabetes ([place])   . Gastritis   . GERD (gastroesophageal reflux disease)   . Gout   . HTN (hypertension)   . Hyperlipidemia   . NSTEMI (non-ST elevated myocardial infarction) ([place])    a. Complex admission 08/2016 - with severe hyperglycemia, AKI on CKD, severe anemia down to Hgb 6.8, acute combined CHF, troponin of 8.5, cath deferred due to renal dysfunction.  . Thyroid disease     Past Surgical History:  Procedure Laterality Date  . COLONOSCOPY WITH PROPOFOL N/A 10/08/2016   Procedure: COLONOSCOPY WITH PROPOFOL;  Surgeon: Ronnette Juniper, MD;  Location: [place];  Service: Gastroenterology;  Laterality: N/A;  . ESOPHAGOGASTRODUODENOSCOPY N/A 10/06/2016   Procedure: ESOPHAGOGASTRODUODENOSCOPY (EGD);  Surgeon: Otis Brace, MD;  Location[facility] ENDOSCOPY;  Service: Gastroenterology;  Laterality: N/A;  . GIVENS CAPSULE STUDY  10/08/2016   Procedure: GIVENS CAPSULE STUDY;  Surgeon: Ronnette Juniper, MD;  Location: [place];  Service: Gastroenterology;;  . ORIF HUMERUS FRACTURE Left 02/07/2015   Procedure: OPEN REDUCTION INTERNAL FIXATION (ORIF) PROXIMAL HUMERUS FRACTURE;  Surgeon: Marybelle Killings, MD;  Location: [place];  Service: Orthopedics;  Laterality: Left;  . THYROID SURGERY    . TOTAL HIP ARTHROPLASTY Left 02/07/2015  Procedure: TOTAL HIP ARTHROPLASTY ANTERIOR APPROACH ;  Surgeon: Marybelle Killings, MD;  Location: [place];  Service: Orthopedics;  Laterality: Left;  . WRIST SURGERY Left     Prior to Admission medications   Medication Sig Start Date End Date Taking? Authorizing Provider  amLODipine (NORVASC) 10  MG tablet Take 10 mg by mouth daily. 07/30/16  Yes [provider]  aspirin EC 81 MG tablet Take 81 mg by mouth daily. 09/21/16  Yes [provider]  clopidogrel (PLAVIX) 75 MG tablet Take 75 mg by mouth daily.   Yes [provider]  ferrous sulfate 325 (65 FE) MG tablet Take 325 mg by mouth daily with breakfast.   Yes [provider]  furosemide (LASIX) 20 MG tablet Take 20 mg by mouth as needed. Only has to take it when she gains 2 lbs 09/20/16  Yes [provider]  gabapentin (NEURONTIN) 300 MG capsule Take 1 capsule by mouth 2 (two) times daily.  01/04/15  Yes [provider]  glipiZIDE (GLUCOTROL) 10 MG tablet Take 1 tablet by mouth 2 (two) times daily. 01/22/15  Yes [provider]  levothyroxine (SYNTHROID, LEVOTHROID) 25 MCG tablet Take 25 mcg by mouth daily before breakfast.   Yes [provider]  metoprolol tartrate (LOPRESSOR) 25 MG tablet Take 12.5 mg by mouth 2 (two) times daily.   Yes [provider]  Multiple Vitamin (MULTIVITAMIN WITH MINERALS) TABS tablet Take 1 tablet by mouth daily.   Yes [provider]  ondansetron (ZOFRAN) 4 MG tablet Take 1 tablet (4 mg total) by mouth every 8 (eight) hours as needed for nausea or vomiting. 10/25/16  Yes [name], MD  pantoprazole (PROTONIX) 40 MG tablet Take 40 mg by mouth daily.   Yes [provider]  ULORIC 40 MG tablet Take 1 tablet by mouth daily. 12/21/14  Yes [provider]    Current Facility-Administered Medications  Medication Dose Route Frequency Provider Last Rate Last Dose  . 0.9 %  sodium chloride infusion   Intravenous Once [place], Marge Duncans, MD      . ferrous sulfate tablet 325 mg  325 mg Oral Q breakfast [place], Gagan S, MD      . insulin aspart (novoLOG) injection 0-9 Units  0-9 Units Subcutaneous TID WC Darrick Meigs, Marge Duncans, MD      . levothyroxine (SYNTHROID, LEVOTHROID) tablet 25 mcg  25 mcg Oral QAC breakfast Oswald Hillock,  MD      . MEDLINE mouth rinse  15 mL Mouth Rinse BID Darrick Meigs, Marge Duncans, MD      . pantoprazole (PROTONIX) injection 40 mg  40 mg Intravenous Q12H Oswald Hillock, MD        Allergies as of 10/25/2016 - Review Complete 10/25/2016  Allergen Reaction Noted  . Insulin glargine Swelling 09/18/2016  . Statins Other (See Comments) 09/18/2016    Family History  Problem Relation Age of Onset  . Diabetes Mother   . Asthma Mother   . Early death Mother 35       pneumonia  . Early death Father        killed at rodeo  . CAD Neg Hx   . GI Bleed Neg Hx     Social History   Social History  . Marital status: Soil scientist    Spouse name: [name]  . Number of children: 2  . Years of education: 12   Occupational History  . retired    Science writer  History Main Topics  . Smoking status: Never Smoker  . Smokeless tobacco: Never Used  . Alcohol use No  . Drug use: No  . Sexual activity: Not on file   Other Topics Concern  . Not on file   Social History Narrative   Lives with significant other/partner [name]   He is her caregiver   Moved from [facility]    Review of Systems: General: Negative for anorexia, weight loss, fever, chills, fatigue, weakness. ENT: Negative for hoarseness, difficulty swallowing. CV: Negative for chest pain, angina, palpitations, peripheral edema.  Respiratory: Negative for dyspnea at rest, cough, sputum, wheezing.  GI: See history of present illness. Neuro: Negative for memory loss, confusion.   Endo: Negative for unusual weight change.  Heme: Negative for bruising or bleeding. Allergy: Negative for rash or hives.  Physical Exam: Vital signs in last 24 hours: Temp:  [97.9 F (36.6 C)-99.1 F (37.3 C)] 99.1 F (37.3 C) (06/01 0400) Pulse Rate:  [78-96] 81 (06/01 0600) Resp:  [11-28] 17 (06/01 0600) BP: (98-126)/(33-93) 105/33 (06/01 0600) SpO2:  [88 %-100 %] 94 % (06/01 0600) Weight:  [188 lb (85.3 kg)-192 lb 0.3 oz (87.1 kg)] 192 lb 0.3 oz (87.1 kg) (06/01 0215) Last  BM Date: 10/25/16 General:   Alert,  Well-developed, well-nourished, pleasant and cooperative in NAD Head:  Normocephalic and atraumatic. Eyes:  Sclera clear, no icterus. Conjunctiva pink. Ears:  Normal auditory acuity. Neck:  Supple; no masses or thyromegaly. Lungs:  Clear throughout to auscultation.  No wheezes, crackles, or rhonchi. No acute distress. Heart:  Regular rate and rhythm; no murmurs, clicks, rubs,  or gallops. Abdomen:  Rounded but soft, nontender and nondistended. No masses, hepatosplenomegaly or hernias noted. Normal bowel sounds, without guarding, and without rebound.   Rectal:  Rectal exam performed and minimal stool obtained, appears light brown; sent to the lab for heme testing.   Msk:  Symmetrical without gross deformities. Pulses:  Normal bilateral pedal pulses noted. Extremities:  Without clubbing or edema. Neurologic:  Alert and  oriented x4;  grossly normal neurologically. Psych:  Alert and cooperative. Normal mood and affect.  Intake/Output from previous day: 05/31 0701 - 06/01 0700 In: 1242.5 [Blood:1242.5] Out: -  Intake/Output this shift: No intake/output data recorded.  Lab Results:  Recent Labs  10/25/16 1026 10/25/16 2104 10/26/16 0454  WBC 10.2 9.5 8.8  HGB 4.4* 3.8* 6.3*  HCT 14.6* 12.6* 19.9*  PLT 439* 316 342   BMET  Recent Labs  10/25/16 1026 10/25/16 2104 10/26/16 0454  NA 140 138 139  K 5.0 5.1 4.4  CL 106 106 106  CO2 21 20* 23  GLUCOSE 232* 229* 164*  BUN 57* 60* 58*  CREATININE 2.58* 2.76* 2.73*  CALCIUM 8.4* 7.9* 7.9*   LFT  Recent Labs  10/25/16 1026 10/25/16 2104 10/26/16 0454  PROT 5.8* 5.7* 5.8*  ALBUMIN 3.3* 2.8* 2.9*  AST 23 36 38  ALT 18 21 23   ALKPHOS 49 41 40  BILITOT 0.3 0.5 0.7   PT/INR No results for input(s): LABPROT, INR in the last 72 hours. Hepatitis Panel No results for input(s): HEPBSAG, HCVAB, HEPAIGM, HEPBIGM in the last 72 hours. C-Diff No results for input(s): CDIFFTOX in the last  72 hours.  Studies/Results: Dg Chest 2 View  Result Date: 10/25/2016 CLINICAL DATA:  Weakness and fever EXAM: CHEST  2 VIEW COMPARISON:  09/17/2016 FINDINGS: Improved aeration since the prior study. Minimal streaky atelectasis or infiltrate at the left base. No large effusion.  Stable cardiomegaly with mild central vascular congestion. No pneumothorax. Multiple surgical screws partially visualized in the proximal left humerus. IMPRESSION: 1. Improved aeration of right lung base since the prior study 2. Minimal streaky atelectasis versus mild infiltrate left lung base 3. Stable cardiomegaly with mild central vascular congestion Electronically Signed   By: Donavan Foil M.D.   On: 10/25/2016 22:16   Ct Head Wo Contrast  Result Date: 10/25/2016 CLINICAL DATA:  76 year old female with generalized weakness today. Fever of 101 F. EXAM: CT HEAD WITHOUT CONTRAST TECHNIQUE: Contiguous axial images were obtained from the base of the skull through the vertex without intravenous contrast. COMPARISON:  None. FINDINGS: Brain: Cerebral volume is within normal limits for age. Mild for age hypodensity in the right corona radiata. Other gray -white matter differentiation is within normal limits throughout the brain. No midline shift, ventriculomegaly, mass effect, evidence of mass lesion, intracranial hemorrhage or evidence of cortically based acute infarction. Vascular: Calcified atherosclerosis at the skull base. Skull: Hyperostosis of the calvarium, normal variant. No acute osseous abnormality identified. Sinuses/Orbits: Mild mostly right maxillary sinus mucosal thickening. No fluid levels or bubbly opacity. Tympanic cavities and mastoids are clear. Other: Postoperative changes to the left globe. Otherwise negative visualized noncontrast orbit, scalp, and face soft tissues. IMPRESSION: 1. Mild for age nonspecific white matter hypodensity in the right corona radiata, such as due to age indeterminate small vessel disease.  Otherwise normal for age noncontrast CT appearance of the brain. 2. Mild maxillary sinus inflammation, significance doubtful. Electronically Signed   By: Genevie Ann M.D.   On: 10/25/2016 21:59    Impression: Fairly complex female patient with chronic anemia likely due to kidney disease and a baseline hemoglobin of 9.5-10.0. She was diagnosed with an NSTEMI at [facility] in mid April and placed on Plavix and aspirin. She was admitted in [facility] for symptomatic anemia approximately 2 weeks ago at which point she underwent colonoscopy, upper endoscopy, and capsule endoscopy. No significant findings other than a polyp which has to be removed electively due to anticoagulation at that time. Her hemoglobin at that time was 6.8. She presented by EMS yesterday with gradually worsening fatigue and dyspnea on exertion. Denies other overt GI symptoms. Rectal exam found black stools but no heme stool card can be found in the system. States she has had dark stools, but takes iron. There is always a possibility that endoscopic evaluation, especially capsule endoscopy, may have missed a intermittent bleeding lesion such a Dieulafoy lesion.  Regardless, she is in a difficult situation with anemia on aspirin and Plavix due to NSTEMI unable to undergo cardiac catheterization do to kidney disease. Additionally she has heart failure and during her exam was noted to be satting in the mid 80s which required increase in oxygen setting per nasal cannula. This would complicate efforts for conscious sedation. Subsequently if any procedure is felt to be needed she may benefit from referral to [facility] for increased/standby cardiac resources  Plan: 1. Monitor for recurrent bleeding or melena 2. Transfuse as necessary. 3. Follow H/H closely 4. Follow-up on heme stool card 5. Supportive measures    Thank you for allowing [place] to participate in the care of [name]  Walden Field, DNP,  AGNP-C Adult & Gerontological Nurse Practitioner [facility]    LOS: 0 days     10/26/2016, 8:25 AM

## 2016-10-26 NOTE — Progress Notes (Signed)
PROGRESS NOTE    [name]  JKK:[id number] DOB: 04-28-1941 DOA: 10/25/2016 PCP: [name], MD    Brief Narrative:  [name]  is a 76 y.o. female, With history of CHF, anemia status post EGD, colonoscopy and capsule endoscopy done on 10/08/2016 which showed gastritis, was brought to hospital after patient was found unresponsive at home as per patient's husband. Patient has been feeling fatigued over past several days also complains of black stools but patient takes iron tablets. In the ED patient was found to have severe anemia with hemoglobin 3.8, 4 units PRBC ordered.  Patient complains of mild shortness of breath, no nausea vomiting. Denies vomiting frank blood or bloody stools. Patient has been started on aspirin and Plavix after she was thought to have non-STEMI  She denies chest pain, no abdominal pain. No dysuria.   Assessment & Plan:   Active Problems:   Anemia  Anemia - patient presented with severe anemia hemoglobin 3.8 - Gastroenterology consulted - 4 units PRBC ordered - continue serial H/H draws - just recently had EGD, colonoscopy and capsule study done  ? GI bleed - patient had EGD, colonoscopy as well as endoscopy recently, which only showed gastritis - GI consulted - patient has been on iron supplements - Start Protonix 40 mg IV every 12 hours.  Diabetes mellitus - will hold oral hypoglycemic agents - start sliding scale insulin with NovoLog.  Hypothyroidism -continue Synthroid  Elevated troponin -patient's troponin elevated to 0.27> 1.60 (likely demand), EKG shows ST depression in lead 1, V4 V5 - could be due to significant anemia - recent NSTEMI  History of CHF/non-STEMI - will hold aspirin, Plavix at this time due to anemia - Continue to monitor cardiac enzymes as above.  Hypertension - hold amlodipine, metoprolol at this time due to hypotension.   DVT prophylaxis: SCDs Code Status: Full Code Family Communication: no  family bedside Disposition Plan: home pending anemia workup   Consultants:   Gastroenterology  Cardiology  Procedures:   none  Antimicrobials:   none    Subjective: Patient is asleep initially on exam.  She voices she feels better now than when she came in- just tired.  Objective: Vitals:   10/26/16 0400 10/26/16 0500 10/26/16 0540 10/26/16 0600  BP: (!) 98/45 (!) 105/47  (!) 105/33  Pulse: 79 84 80 81  Resp: 15 16 16 17   Temp: 99.1 F (37.3 C)     TempSrc: Oral     SpO2: 96%  (!) 88% 94%  Weight:      Height:        Intake/Output Summary (Last 24 hours) at 10/26/16 0742 Last data filed at 10/26/16 0106  Gross per 24 hour  Intake           1242.5 ml  Output                0 ml  Net           1242.5 ml   Filed Weights   10/25/16 2000 10/26/16 0215  Weight: 85.3 kg (188 lb) 87.1 kg (192 lb 0.3 oz)    Examination:  General exam: Appears calm and comfortable  Respiratory system: Clear to auscultation. Respiratory effort normal. Cardiovascular system: S1 & S2 heard, RRR. No JVD, murmurs, rubs, gallops or clicks. No pedal edema. Gastrointestinal system: Abdomen is nondistended, soft and nontender. No organomegaly or masses felt. Normal bowel sounds heard. Central nervous system: Alert and oriented. No focal neurological deficits. Extremities: Symmetric  5 x 5 power. Skin: No rashes, lesions or ulcers Psychiatry: Judgement and insight appear normal. Mood & affect appropriate.     Data Reviewed: I have personally reviewed following labs and imaging studies  CBC:  Recent Labs Lab 10/25/16 1026 10/25/16 2104 10/26/16 0454  WBC 10.2 9.5 8.8  NEUTROABS 8,058* 7.9*  --   HGB 4.4* 3.8* 6.3*  HCT 14.6* 12.6* 19.9*  MCV 91.3 92.0 88.1  PLT 439* 316 476   Basic Metabolic Panel:  Recent Labs Lab 10/25/16 1026 10/25/16 2104 10/26/16 0454  NA 140 138 139  K 5.0 5.1 4.4  CL 106 106 106  CO2 21 20* 23  GLUCOSE 232* 229* 164*  BUN 57* 60* 58*    CREATININE 2.58* 2.76* 2.73*  CALCIUM 8.4* 7.9* 7.9*   GFR: Estimated Creatinine Clearance: 18.7 mL/min (A) (by C-G formula based on SCr of 2.73 mg/dL (H)). Liver Function Tests:  Recent Labs Lab 10/25/16 1026 10/25/16 2104 10/26/16 0454  AST 23 36 38  ALT 18 21 23   ALKPHOS 49 41 40  BILITOT 0.3 0.5 0.7  PROT 5.8* 5.7* 5.8*  ALBUMIN 3.3* 2.8* 2.9*   No results for input(s): LIPASE, AMYLASE in the last 168 hours. No results for input(s): AMMONIA in the last 168 hours. Coagulation Profile: No results for input(s): INR, PROTIME in the last 168 hours. Cardiac Enzymes:  Recent Labs Lab 10/25/16 2104 10/26/16 0454  TROPONINI 0.27* 1.60*   BNP (last 3 results) No results for input(s): PROBNP in the last 8760 hours. HbA1C: No results for input(s): HGBA1C in the last 72 hours. CBG: No results for input(s): GLUCAP in the last 168 hours. Lipid Profile: No results for input(s): CHOL, HDL, LDLCALC, TRIG, CHOLHDL, LDLDIRECT in the last 72 hours. Thyroid Function Tests: No results for input(s): TSH, T4TOTAL, FREET4, T3FREE, THYROIDAB in the last 72 hours. Anemia Panel: No results for input(s): VITAMINB12, FOLATE, FERRITIN, TIBC, IRON, RETICCTPCT in the last 72 hours. Sepsis Labs:  Recent Labs Lab 10/25/16 2104  LATICACIDVEN 3.6*    Recent Results (from the past 240 hour(s))  Culture, blood (routine x 2)     Status: None (Preliminary result)   Collection Time: 10/25/16  9:04 PM  Result Value Ref Range Status   Specimen Description LEFT ANTECUBITAL  Final   Special Requests   Final    BOTTLES DRAWN AEROBIC AND ANAEROBIC Blood Culture results may not be optimal due to an inadequate volume of blood received in culture bottles   Culture PENDING  Incomplete   Report Status PENDING  Incomplete  Culture, blood (routine x 2)     Status: None (Preliminary result)   Collection Time: 10/25/16  9:04 PM  Result Value Ref Range Status   Specimen Description BLOOD LEFT HAND  Final    Special Requests   Final    BOTTLES DRAWN AEROBIC AND ANAEROBIC Blood Culture adequate volume   Culture PENDING  Incomplete   Report Status PENDING  Incomplete  MRSA PCR Screening     Status: None   Collection Time: 10/26/16  1:56 AM  Result Value Ref Range Status   MRSA by PCR NEGATIVE NEGATIVE Final    Comment:        The GeneXpert MRSA Assay (FDA approved for NASAL specimens only), is one component of a comprehensive MRSA colonization surveillance program. It is not intended to diagnose MRSA infection nor to guide or monitor treatment for MRSA infections.          Radiology  Studies: Dg Chest 2 View  Result Date: 10/25/2016 CLINICAL DATA:  Weakness and fever EXAM: CHEST  2 VIEW COMPARISON:  09/17/2016 FINDINGS: Improved aeration since the prior study. Minimal streaky atelectasis or infiltrate at the left base. No large effusion. Stable cardiomegaly with mild central vascular congestion. No pneumothorax. Multiple surgical screws partially visualized in the proximal left humerus. IMPRESSION: 1. Improved aeration of right lung base since the prior study 2. Minimal streaky atelectasis versus mild infiltrate left lung base 3. Stable cardiomegaly with mild central vascular congestion Electronically Signed   By: Donavan Foil M.D.   On: 10/25/2016 22:16   Ct Head Wo Contrast  Result Date: 10/25/2016 CLINICAL DATA:  76 year old female with generalized weakness today. Fever of 101 F. EXAM: CT HEAD WITHOUT CONTRAST TECHNIQUE: Contiguous axial images were obtained from the base of the skull through the vertex without intravenous contrast. COMPARISON:  None. FINDINGS: Brain: Cerebral volume is within normal limits for age. Mild for age hypodensity in the right corona radiata. Other gray -white matter differentiation is within normal limits throughout the brain. No midline shift, ventriculomegaly, mass effect, evidence of mass lesion, intracranial hemorrhage or evidence of cortically based  acute infarction. Vascular: Calcified atherosclerosis at the skull base. Skull: Hyperostosis of the calvarium, normal variant. No acute osseous abnormality identified. Sinuses/Orbits: Mild mostly right maxillary sinus mucosal thickening. No fluid levels or bubbly opacity. Tympanic cavities and mastoids are clear. Other: Postoperative changes to the left globe. Otherwise negative visualized noncontrast orbit, scalp, and face soft tissues. IMPRESSION: 1. Mild for age nonspecific white matter hypodensity in the right corona radiata, such as due to age indeterminate small vessel disease. Otherwise normal for age noncontrast CT appearance of the brain. 2. Mild maxillary sinus inflammation, significance doubtful. Electronically Signed   By: Genevie Ann M.D.   On: 10/25/2016 21:59        Scheduled Meds: . ferrous sulfate  325 mg Oral Q breakfast  . insulin aspart  0-9 Units Subcutaneous TID WC  . levothyroxine  25 mcg Oral QAC breakfast  . mouth rinse  15 mL Mouth Rinse BID  . pantoprazole (PROTONIX) IV  40 mg Intravenous Q12H   Continuous Infusions: . sodium chloride       LOS: 0 days    Time spent: 30 minutes    Loretha Stapler, MD [facility] Pager [phone]  If 7PM-7AM, please contact night-coverage [email or web address] Password [facility] 10/26/2016, 7:42 AM

## 2016-10-26 NOTE — H&P (Signed)
TRH H&P    Patient Demographics:    [name], is a 76 y.o. female  MRN: [id number]  DOB - 1940-09-18  Admit Date - 10/25/2016  Referring MD/NP/PA: Dr Thurnell Garbe  Outpatient Primary MD for the patient is [name], MD  Patient coming from: home   Chief Complaint  Patient presents with  . Fatigue      HPI:    [name]  is a 76 y.o. female, With history of CHF, anemia status post EGD, colonoscopy and capsule endoscopy done on 10/08/2016 which showed gastritis, was brought to hospital after patient was found unresponsive at home as per patient's husband. Patient has been feeling fatigued over past several days also complains of black stools but patient takes iron tablets.   In the ED patient was found to have severe anemia with hemoglobin 3.8, 4 units PRBC ordered.  Patient complains of mild shortness of breath, no nausea vomiting. Denies vomiting frank blood or bloody stools. Patient has been started on aspirin and Plavix after she was thought to have non-STEMI  She denies chest pain, no abdominal pain. No dysuria.    Review of systems:      A full 10 point Review of Systems was done, except as stated above, all other Review of Systems were negative.   With Past History of the following :    Past Medical History:  Diagnosis Date  . Anemia   . Arthritis   . Blood transfusion without reported diagnosis   . Cataract   . Chronic anemia   . Chronic combined systolic and diastolic CHF (congestive heart failure) ([place])    a. 2D echo 08/2016 at [facility]: EF 50-55% with inferior wall HK, impaired LV filling, fair study.  . CKD (chronic kidney disease), stage III   . Diabetes ([place])   . Gastritis   . GERD (gastroesophageal reflux disease)   . Gout   . HTN (hypertension)   . Hyperlipidemia   . NSTEMI (non-ST elevated myocardial infarction) ([place])    a. Complex admission 08/2016 - with  severe hyperglycemia, AKI on CKD, severe anemia down to Hgb 6.8, acute combined CHF, troponin of 8.5, cath deferred due to renal dysfunction.  . Thyroid disease       Past Surgical History:  Procedure Laterality Date  . COLONOSCOPY WITH PROPOFOL N/A 10/08/2016   Procedure: COLONOSCOPY WITH PROPOFOL;  Surgeon: Ronnette Juniper, MD;  Location: [place];  Service: Gastroenterology;  Laterality: N/A;  . ESOPHAGOGASTRODUODENOSCOPY N/A 10/06/2016   Procedure: ESOPHAGOGASTRODUODENOSCOPY (EGD);  Surgeon: Otis Brace, MD;  Location[facility] END[facility]COPY;  Service: Gastroenterology;  Laterality: N/A;  . GIVENS CAPSULE STUDY  10/08/2016   Procedure: GIVENS CAPSULE STUDY;  Surgeon: Ronnette Juniper, MD;  Location: [place];  Service: Gastroenterology;;  . ORIF HUMERUS FRACTURE Left 02/07/2015   Procedure: OPEN REDUCTION INTERNAL FIXATION (ORIF) PROXIMAL HUMERUS FRACTURE;  Surgeon: Marybelle Killings, MD;  Location: [place];  Service: Orthopedics;  Laterality: Left;  . THYROID SURGERY    . TOTAL HIP ARTHROPLASTY Left 02/07/2015   Procedure: TOTAL  HIP ARTHROPLASTY ANTERIOR APPROACH ;  Surgeon: Marybelle Killings, MD;  Location: [place];  Service: Orthopedics;  Laterality: Left;  . WRIST SURGERY Left       Social History:      Social History  Substance Use Topics  . Smoking status: Never Smoker  . Smokeless tobacco: Never Used  . Alcohol use No       Family History :     Family History  Problem Relation Age of Onset  . Diabetes Mother   . Asthma Mother   . Early death Mother 63       pneumonia  . Early death Father        killed at [place]  . CAD Neg Hx   . GI Bleed Neg Hx       Home Medications:   Prior to Admission medications   Medication Sig Start Date End Date Taking? Authorizing Provider  amLODipine (NORVASC) 10 MG tablet Take 10 mg by mouth daily. 07/30/16  Yes [provider]  aspirin EC 81 MG tablet Take 81 mg by mouth daily. 09/21/16  Yes [provider]  clopidogrel (PLAVIX) 75  MG tablet Take 75 mg by mouth daily.   Yes [provider]  ferrous sulfate 325 (65 FE) MG tablet Take 325 mg by mouth daily with breakfast.   Yes [provider]  furosemide (LASIX) 20 MG tablet Take 20 mg by mouth as needed. Only has to take it when she gains 2 lbs 09/20/16  Yes [provider]  gabapentin (NEURONTIN) 300 MG capsule Take 1 capsule by mouth 2 (two) times daily.  01/04/15  Yes [provider]  glipiZIDE (GLUCOTROL) 10 MG tablet Take 1 tablet by mouth 2 (two) times daily. 01/22/15  Yes [provider]  levothyroxine (SYNTHROID, LEVOTHROID) 25 MCG tablet Take 25 mcg by mouth daily before breakfast.   Yes [provider]  metoprolol tartrate (LOPRESSOR) 25 MG tablet Take 12.5 mg by mouth 2 (two) times daily.   Yes [provider]  Multiple Vitamin (MULTIVITAMIN WITH MINERALS) TABS tablet Take 1 tablet by mouth daily.   Yes [provider]  ondansetron (ZOFRAN) 4 MG tablet Take 1 tablet (4 mg total) by mouth every 8 (eight) hours as needed for nausea or vomiting. 10/25/16  Yes [name], MD  pantoprazole (PROTONIX) 40 MG tablet Take 40 mg by mouth daily.   Yes [provider]  ULORIC 40 MG tablet Take 1 tablet by mouth daily. 12/21/14  Yes [provider]     Allergies:     Allergies  Allergen Reactions  . Insulin Glargine Swelling    "Makes me swell like a balloon all over", including face, but without any respiratory distress or rashes. Associated with weight gain.  . Statins Other (See Comments)    "I've tried them all; my muscle aches were so bad I couldn't walk".     Physical Exam:   Vitals  Blood pressure (!) 106/45, pulse 85, temperature 98.7 F (37.1 C), temperature source Oral, resp. rate 18, height 5\' 4"  (1.626 m), weight 85.3 kg (188 lb), SpO2 100 %.  1.  General: Appears in no acute distress  2. Psychiatric:  Intact judgement and  insight, awake alert, oriented x  3.  3. Neurologic: No focal neurological deficits, all cranial nerves intact.Strength 5/5 all 4 extremities, sensation intact all 4 extremities, plantars down going.  4. Eyes :  anicteric sclerae, moist conjunctivae with no lid lag.  PERRLA.  5. ENMT:  Oropharynx clear with moist mucous membranes and good dentition  6. Neck:  supple, no cervical lymphadenopathy appriciated, No thyromegaly  7. Respiratory : Normal respiratory effort, good air movement bilaterally,clear to  auscultation bilaterally  8. Cardiovascular : RRR, no gallops, rubs or murmurs, no leg edema  9. Gastrointestinal:  Positive bowel sounds, abdomen soft, non-tender to palpation,no hepatosplenomegaly, no rigidity or guarding       10. Skin:  No cyanosis, normal texture and turgor, no rash, lesions or ulcers  11.Musculoskeletal:  Good muscle tone,  joints appear normal , no effusions,  normal range of motion    Data Review:    CBC  Recent Labs Lab 10/25/16 1026 10/25/16 2104  WBC 10.2 9.5  HGB 4.4* 3.8*  HCT 14.6* 12.6*  PLT 439* 316  MCV 91.3 92.0  MCH 27.5 27.7  MCHC 30.1* 30.2  RDW 16.6* 17.0*  LYMPHSABS 1,530 1.1  MONOABS 510 0.4  EOSABS 102 0.0  BASOSABS 0 0.0   ------------------------------------------------------------------------------------------------------------------  Chemistries   Recent Labs Lab 10/25/16 1026 10/25/16 2104  NA 140 138  K 5.0 5.1  CL 106 106  CO2 21 20*  GLUCOSE 232* 229*  BUN 57* 60*  CREATININE 2.58* 2.76*  CALCIUM 8.4* 7.9*  AST 23 36  ALT 18 21  ALKPHOS 49 41  BILITOT 0.3 0.5   ------------------------------------------------------------------------------------------------------------------  ------------------------------------------------------------------------------------------------------------------ GFR: Estimated Creatinine Clearance: 18.3 mL/min (A) (by C-G formula based on SCr of 2.76 mg/dL (H)). Liver Function Tests:  Recent  Labs Lab 10/25/16 1026 10/25/16 2104  AST 23 36  ALT 18 21  ALKPHOS 49 41  BILITOT 0.3 0.5  PROT 5.8* 5.7*  ALBUMIN 3.3* 2.8*   No results for input(s): LIPASE, AMYLASE in the last 168 hours. No results for input(s): AMMONIA in the last 168 hours. Coagulation Profile: No results for input(s): INR, PROTIME in the last 168 hours. Cardiac Enzymes:  Recent Labs Lab 10/25/16 2104  TROPONINI 0.27*   BNP (last 3 results) No results for input(s): PROBNP in the last 8760 hours. HbA1C: No results for input(s): HGBA1C in the last 72 hours. CBG: No results for input(s): GLUCAP in the last 168 hours. Lipid Profile: No results for input(s): CHOL, HDL, LDLCALC, TRIG, CHOLHDL, LDLDIRECT in the last 72 hours. Thyroid Function Tests: No results for input(s): TSH, T4TOTAL, FREET4, T3FREE, THYROIDAB in the last 72 hours. Anemia Panel: No results for input(s): VITAMINB12, FOLATE, FERRITIN, TIBC, IRON, RETICCTPCT in the last 72 hours.  --------------------------------------------------------------------------------------------------------------- Urine analysis:    Component Value Date/Time   COLORURINE YELLOW 10/25/2016 2045   APPEARANCEUR HAZY (A) 10/25/2016 2045   LABSPEC 1.013 10/25/2016 2045   PHURINE 5.0 10/25/2016 2045   GLUCOSEU 50 (A) 10/25/2016 2045   HGBUR NEGATIVE 10/25/2016 2045   BILIRUBINUR NEGATIVE 10/25/2016 2045   [place] 10/25/2016 2045   PROTEINUR 30 (A) 10/25/2016 2045   UROBILINOGEN 0.2 02/08/2015 1615   NITRITE NEGATIVE 10/25/2016 2045   LEUKOCYTESUR NEGATIVE 10/25/2016 2045      Imaging Results:    Dg Chest 2 View  Result Date: 10/25/2016 CLINICAL DATA:  Weakness and fever EXAM: CHEST  2 VIEW COMPARISON:  09/17/2016 FINDINGS: Improved aeration since the prior study. Minimal streaky atelectasis or infiltrate at the left base. No large effusion. Stable cardiomegaly with mild central vascular congestion. No pneumothorax. Multiple surgical screws  partially visualized in the proximal left humerus. IMPRESSION: 1. Improved aeration of right lung base since the prior study 2. Minimal streaky  atelectasis versus mild infiltrate left lung base 3. Stable cardiomegaly with mild central vascular congestion Electronically Signed   By: Donavan Foil M.D.   On: 10/25/2016 22:16   Ct Head Wo Contrast  Result Date: 10/25/2016 CLINICAL DATA:  76 year old female with generalized weakness today. Fever of 101 F. EXAM: CT HEAD WITHOUT CONTRAST TECHNIQUE: Contiguous axial images were obtained from the base of the skull through the vertex without intravenous contrast. COMPARISON:  None. FINDINGS: Brain: Cerebral volume is within normal limits for age. Mild for age hypodensity in the right corona radiata. Other gray -white matter differentiation is within normal limits throughout the brain. No midline shift, ventriculomegaly, mass effect, evidence of mass lesion, intracranial hemorrhage or evidence of cortically based acute infarction. Vascular: Calcified atherosclerosis at the skull base. Skull: Hyperostosis of the calvarium, normal variant. No acute osseous abnormality identified. Sinuses/Orbits: Mild mostly right maxillary sinus mucosal thickening. No fluid levels or bubbly opacity. Tympanic cavities and mastoids are clear. Other: Postoperative changes to the left globe. Otherwise negative visualized noncontrast orbit, scalp, and face soft tissues. IMPRESSION: 1. Mild for age nonspecific white matter hypodensity in the right corona radiata, such as due to age indeterminate small vessel disease. Otherwise normal for age noncontrast CT appearance of the brain. 2. Mild maxillary sinus inflammation, significance doubtful. Electronically Signed   By: Genevie Ann M.D.   On: 10/25/2016 21:59    My personal review of EKG: Rhythm NSR, ST depression in leads 1, V4 V5   Assessment & Plan:    Active Problems:   Anemia   Elevated troponin   Diabetes mellitus    Hypothyroidism  1. Anemia- patient presented with severe anemia hemoglobin 3.8, patient recently had EGD, colonoscopy and capsule endoscopy which only revealed gastritis. 4 units PRBC has been ordered for blood transfusion. Will follow CBC in a.m. 2. ? GI bleed- patient had EGD colonoscopy as well as endoscopy recently, which only showed gastritis. Will consult gastroenterology in a.m. for further recommendations. Patient does have history of black stools, patient takes iron pills. Stool for occult blood has been ordered. Start Protonix 40 mg IV every 12 hours. 3. Diabetes mellitus- will hold oral hypoglycemic agents, start sliding scale insulin with NovoLog. 4. Hypothyroidism-continue Synthroid 5. Elevated troponin-patient's troponin elevated to 0.27, EKG shows ST depression in lead 1, V4 V5. Will cycle troponin every 6 hours 3. Patient denies chest pain at this time. 6. History of CHF/non-STEMI- will hold aspirin, Plavix at this time due to above. Continue to monitor cardiac enzymes as above. 7. Hypertension-hold amlodipine, metoprolol at this time due to hypotension.   DVT Prophylaxis-   SCDs   AM Labs Ordered, also please review Full Orders  Family Communication: Admission, patients condition and plan of care including tests being ordered have been discussed with the patient and her husband at bedside who indicate understanding and agree with the plan and Code Status.  Code Status:  Full code  Admission status: Observation  Time spent in minutes : 60 minutes   Telesforo Brosnahan S M.D on 10/26/2016 at 2:09 AM  Between 7am to 7pm - Pager - [phone]. After 7pm go to [email or web address] - password [facility]  [facility] - Office  [phone]

## 2016-10-26 NOTE — Progress Notes (Signed)
Called by RN to evaluate patient for worsening shortness of breath, O2 sats in the 80s on 10 L of oxygen via nasal cannula. Patient endorses mild shortness of breath. She has received 4 units PRBC today. Patient does have a history of CHF and takes Lasix when necessary at home.  On exam Lungs-bibasilar crackles  Assessment Pulmonary edema  Plan Check portable chest x-ray Lasix 40 mg IV 1 Check H&H stat

## 2016-10-26 NOTE — Progress Notes (Signed)
Notified physician for worsening shortness of breath, increased oxygen needs, and lung crackles. Physician in and evaluated patient, orders received and completed. Pt has external catheter in use and working. Pt is describing some relief at this time.

## 2016-10-26 NOTE — Consult Note (Signed)
Cardiology Consultation:   Patient ID: [name]; [id number]; [birth year]   Admit date: 10/25/2016 Date of Consult: 10/26/2016  Primary Care Provider: [name], MD Primary Cardiologist: Dr Domenic Polite   Patient Profile:   [name] is a 76 y.o. female with a hx of recent NSTEMI who is being seen today for the evaluation of severe anemia at the request of Dr Adair Patter.  History of Present Illness:   [name] is 76 y/o female who was seen in the office 10/04/16 as a new cardiac evaluation after a recent NSTEMI (Troponin 8.5) during a complicated admission at [facility] in April 2018. The pt has a history of DM, HTN, and CRI-3B. She presented with repsiratory failure to [facility] 09/17/16. Initially this was felt to be CAP but later felt to be diastolic CHF. Echo showed her EF to be 50-55% with inferior wall HK. She was also noted to be anemic (not new for her) and was transfused. Her Hgb at discharge was 9.1. She was not cathed then secondary to SCr of 2.   She was seen in the office 10/04/16 for consideration of further cardiac evaluation. The pt reported generalized weakness (no history of chest pain). She also reported black stools. A STAT Hgb was 4.9 and she was admitted for further work up. She received 3 units PRBCs. Evlauation by [facility] included an endoscopy, colonoscopy, and capsul endoscopy. No obvious source of significant bleeding was found and ASA and Plavix was resumed. Hgb 8.2 at discharge 10/09/16.   She was found unresponsive at home 10/25/16 and brought back to the ED at [facility]. Her Hgb was 4.4. She has been type and crossed 4 units. Troponin #2- 1.6. She denies chest pain. ASA and Plavix have been stopped.    Past Medical History:  Diagnosis Date  . Anemia   . Arthritis   . Blood transfusion without reported diagnosis   . Cataract   . Chronic anemia   . Chronic combined systolic and diastolic CHF (congestive heart failure) ([place])    a. 2D echo  08/2016 at [facility]: EF 50-55% with inferior wall HK, impaired LV filling, fair study.  . CKD (chronic kidney disease), stage III   . Diabetes ([place])   . Gastritis   . GERD (gastroesophageal reflux disease)   . Gout   . HTN (hypertension)   . Hyperlipidemia   . NSTEMI (non-ST elevated myocardial infarction) ([place])    a. Complex admission 08/2016 - with severe hyperglycemia, AKI on CKD, severe anemia down to Hgb 6.8, acute combined CHF, troponin of 8.5, cath deferred due to renal dysfunction.  . Thyroid disease     Past Surgical History:  Procedure Laterality Date  . COLONOSCOPY WITH PROPOFOL N/A 10/08/2016   Procedure: COLONOSCOPY WITH PROPOFOL;  Surgeon: Ronnette Juniper, MD;  Location: [place];  Service: Gastroenterology;  Laterality: N/A;  . ESOPHAGOGASTRODUODENOSCOPY N/A 10/06/2016   Procedure: ESOPHAGOGASTRODUODENOSCOPY (EGD);  Surgeon: Otis Brace, MD;  Location: [facility];  Service: Gastroenterology;  Laterality: N/A;  . GIVENS CAPSULE STUDY  10/08/2016   Procedure: GIVENS CAPSULE STUDY;  Surgeon: Ronnette Juniper, MD;  Location: [place];  Service: Gastroenterology;;  . ORIF HUMERUS FRACTURE Left 02/07/2015   Procedure: OPEN REDUCTION INTERNAL FIXATION (ORIF) PROXIMAL HUMERUS FRACTURE;  Surgeon: Marybelle Killings, MD;  Location: [place];  Service: Orthopedics;  Laterality: Left;  . THYROID SURGERY    . TOTAL HIP ARTHROPLASTY Left 02/07/2015   Procedure: TOTAL HIP ARTHROPLASTY ANTERIOR APPROACH ;  Surgeon: Marybelle Killings, MD;  Location: [place];  Service: Orthopedics;  Laterality: Left;  . WRIST SURGERY Left      Inpatient Medications: Scheduled Meds: . ferrous sulfate  325 mg Oral Q breakfast  . insulin aspart  0-9 Units Subcutaneous TID WC  . levothyroxine  25 mcg Oral QAC breakfast  . mouth rinse  15 mL Mouth Rinse BID  . pantoprazole (PROTONIX) IV  40 mg Intravenous Q12H   Continuous Infusions: . sodium chloride     PRN Meds:   Allergies:    Allergies  Allergen Reactions  .  Insulin Glargine Swelling    "Makes me swell like a balloon all over", including face, but without any respiratory distress or rashes. Associated with weight gain.  . Statins Other (See Comments)    "I've tried them all; my muscle aches were so bad I couldn't walk".    Social History:   Social History   Social History  . Marital status: Soil scientist    Spouse name: [name]  . Number of children: 2  . Years of education: 12   Occupational History  . retired    Social History Main Topics  . Smoking status: Never Smoker  . Smokeless tobacco: Never Used  . Alcohol use No  . Drug use: No  . Sexual activity: Not on file   Other Topics Concern  . Not on file   Social History Narrative   Lives with significant other/partner [name]   He is her caregiver   Moved from [facility]    Family History:   The patient's family history includes Asthma in her mother; Diabetes in her mother; Early death in her father; Early death (age of onset: 33) in her mother. There is no history of CAD or GI Bleed.  ROS:  Please see the history of present illness.  ROS  All other ROS reviewed and negative.     Physical Exam/Data:   Vitals:   10/26/16 0400 10/26/16 0500 10/26/16 0540 10/26/16 0600  BP: (!) 98/45 (!) 105/47  (!) 105/33  Pulse: 79 84 80 81  Resp: 15 16 16 17   Temp: 99.1 F (37.3 C)     TempSrc: Oral     SpO2: 96%  (!) 88% 94%  Weight:      Height:        Intake/Output Summary (Last 24 hours) at 10/26/16 0850 Last data filed at 10/26/16 0106  Gross per 24 hour  Intake           1242.5 ml  Output                0 ml  Net           1242.5 ml   Filed Weights   10/25/16 2000 10/26/16 0215  Weight: 188 lb (85.3 kg) 192 lb 0.3 oz (87.1 kg)   Body mass index is 32.96 kg/m.  General:  Well nourished, well developed, in no acute distress HEENT: normal Lymph: no adenopathy Neck: no JVD Endocrine:  No thryomegaly Vascular: No carotid bruits; FA pulses 2+ bilaterally without bruits   Cardiac:  normal S1, S2; RRR; no murmur  Lungs:  clear to auscultation bilaterally, no wheezing, rhonchi or rales  Abd: obese, soft, nontender, no hepatomegaly  Ext: no edema Musculoskeletal:  No deformities, BUE and BLE strength normal and equal Skin: pale, warm and dry  Neuro:  CNs 2-12 intact, no focal abnormalities noted Psych:  Normal affect    EKG:  The EKG was personally reviewed and demonstrates NSR, borderline lateral ST depression  Relevant CV Studies: Echo 09/18/16 [facility])  TTE showed EF 50-55% with apical LV inferior wall hypokinesis and impaired LV filling.    Laboratory Data:  Chemistry Recent Labs Lab 10/25/16 1026 10/25/16 2104 10/26/16 0454  NA 140 138 139  K 5.0 5.1 4.4  CL 106 106 106  CO2 21 20* 23  GLUCOSE 232* 229* 164*  BUN 57* 60* 58*  CREATININE 2.58* 2.76* 2.73*  CALCIUM 8.4* 7.9* 7.9*  GFRNONAA 17* 16* 16*  GFRAA 20* 18* 18*  ANIONGAP  --  12 10     Recent Labs Lab 10/25/16 1026 10/25/16 2104 10/26/16 0454  PROT 5.8* 5.7* 5.8*  ALBUMIN 3.3* 2.8* 2.9*  AST 23 36 38  ALT 18 21 23   ALKPHOS 49 41 40  BILITOT 0.3 0.5 0.7   Hematology Recent Labs Lab 10/25/16 1026 10/25/16 2104 10/26/16 0454  WBC 10.2 9.5 8.8  RBC 1.60* 1.37* 2.26*  HGB 4.4* 3.8* 6.3*  HCT 14.6* 12.6* 19.9*  MCV 91.3 92.0 88.1  MCH 27.5 27.7 27.9  MCHC 30.1* 30.2 31.7  RDW 16.6* 17.0* 16.4*  PLT 439* 316 342   Cardiac Enzymes Recent Labs Lab 10/25/16 2104 10/26/16 0454  TROPONINI 0.27* 1.60*   No results for input(s): TROPIPOC in the last 168 hours.  BNPNo results for input(s): BNP, PROBNP in the last 168 hours.  DDimer No results for input(s): DDIMER in the last 168 hours.  Radiology/Studies:  Dg Chest 2 View  Result Date: 10/25/2016 CLINICAL DATA:  Weakness and fever EXAM: CHEST  2 VIEW COMPARISON:  09/17/2016 FINDINGS: Improved aeration since the prior study. Minimal streaky atelectasis or infiltrate at the left base. No large effusion. Stable  cardiomegaly with mild central vascular congestion. No pneumothorax. Multiple surgical screws partially visualized in the proximal left humerus. IMPRESSION: 1. Improved aeration of right lung base since the prior study 2. Minimal streaky atelectasis versus mild infiltrate left lung base 3. Stable cardiomegaly with mild central vascular congestion Electronically Signed   By: Donavan Foil M.D.   On: 10/25/2016 22:16   Ct Head Wo Contrast  Result Date: 10/25/2016 CLINICAL DATA:  76 year old female with generalized weakness today. Fever of 101 F. EXAM: CT HEAD WITHOUT CONTRAST TECHNIQUE: Contiguous axial images were obtained from the base of the skull through the vertex without intravenous contrast. COMPARISON:  None. FINDINGS: Brain: Cerebral volume is within normal limits for age. Mild for age hypodensity in the right corona radiata. Other gray -white matter differentiation is within normal limits throughout the brain. No midline shift, ventriculomegaly, mass effect, evidence of mass lesion, intracranial hemorrhage or evidence of cortically based acute infarction. Vascular: Calcified atherosclerosis at the skull base. Skull: Hyperostosis of the calvarium, normal variant. No acute osseous abnormality identified. Sinuses/Orbits: Mild mostly right maxillary sinus mucosal thickening. No fluid levels or bubbly opacity. Tympanic cavities and mastoids are clear. Other: Postoperative changes to the left globe. Otherwise negative visualized noncontrast orbit, scalp, and face soft tissues. IMPRESSION: 1. Mild for age nonspecific white matter hypodensity in the right corona radiata, such as due to age indeterminate small vessel disease. Otherwise normal for age noncontrast CT appearance of the brain. 2. Mild maxillary sinus inflammation, significance doubtful. Electronically Signed   By: Genevie Ann M.D.   On: 10/25/2016 21:59    Assessment and Plan:   1. Recurrent life threatening GI bleed-ASA and Plavix stopped. Pt being  transfused  2. NSTEMI- pt's  Troponin was 8 in April and 1.6 so far this admission. Unable to evaluate further secondary to GI bleeding and SCR  3. CRI-4- GFR 17.   4. Respiratory failure and diastolic CHF April 9150- Echo showed EF 50-55% with DD  5. H/O of dyslipidemia- reported statin intolerance  Plan: MD to see. Fortunately she is chest pain free, (in fact she has never had any chest pain), and VS are stable. Add low dose beta blocker when able to take POs- currently NPO.    Angelena Form, PA-C  10/26/2016 8:50 AM   Attending note Patient seen and discussed with PA Rosalyn Gess, I agree with his documentation above. 76 yo female history of HTN, DM2, CKD, anemia.  From chart review complex admission in 08/2016 with hyperglyemia, hypoxia, CHF, anemia, AKI on CKD and NSTEMI. NSTEMI was treated medically including DAPT, likely due to her advanced renal dysfunction. Notes mention consideration for ischemic evaluation in the future. Ech 08/2016 LVEF 50-55%, grade I diastoilc dysfunction, mild anterior septal hypokinesis, apical LV inferior hypokinesis, apical hypokinesis.  Patient readmitted with severe anemia, Hgb 4.4. DAPT has been stopped. Of note her previous NSTEMI was medically managed and she does not have a stent. 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 Her renal function and recurrent anemia has prohibited ischemic testing. Primary focus would be managing her anemia at this time, no additional cardiac interventions planned. Medical therapy as tolerated. She has statin allergy. Essentially limited to beta blocker therapy when hemodynamics allow, would stay off DAPT at this time. Monitor fluid status with transfusions. Echo 4/018 with normal LVEF, primarily apical WMAs, given her severe systemic issues at that time raises question about possible stress induced CM at that time. May be worth  repeating echo at some point in the future to see if WMAs have resolved.    Carlyle Dolly MD

## 2016-10-26 NOTE — Progress Notes (Signed)
Dr Darrick Meigs notified of critical labs called at 0625 of hbg of 6.3 and of troponin of 1.60. New orders received and noted

## 2016-10-26 NOTE — Progress Notes (Signed)
Note: heme stool card negative based on rectal exam in the ICU  Thank you for allowing [place] to participate in the care of [place], DNP, AGNP-C Adult & Gerontological Nurse Practitioner [facility]    LOS: 0 days    10/26/2016, 1:23 PM

## 2016-10-26 NOTE — Progress Notes (Signed)
Patient was ordered 2 units of blood. After first unit patient had a temperature of 101.9 but she had a fever of 99.5 before blood start and had fever all morning. Decided to give 1st unit. Second unit I called Dr. Adair Patter and I had been told in Blood bank that a total of 6 units had been ordered. In trying to explain this to Dr. Adair Patter all units were cancelled and I had to place order for 1 unit back in and Dr. Adair Patter did agree that she wanted to give the second unit.

## 2016-10-27 ENCOUNTER — Encounter: Payer: Self-pay | Admitting: Family Medicine

## 2016-10-27 ENCOUNTER — Encounter: Payer: Self-pay | Admitting: Physician Assistant

## 2016-10-27 ENCOUNTER — Encounter: Payer: Self-pay | Admitting: Gastroenterology

## 2016-10-27 DIAGNOSIS — D649 Anemia, unspecified: Secondary | ICD-10-CM

## 2016-10-27 DIAGNOSIS — J810 Acute pulmonary edema: Secondary | ICD-10-CM

## 2016-10-27 LAB — GLUCOSE, CAPILLARY
Glucose-Capillary: 200 mg/dL — ABNORMAL HIGH (ref 65–99)
Glucose-Capillary: 207 mg/dL — ABNORMAL HIGH (ref 65–99)
Glucose-Capillary: 156 mg/dL — ABNORMAL HIGH (ref 65–99)
Glucose-Capillary: 173 mg/dL — ABNORMAL HIGH (ref 65–99)

## 2016-10-27 LAB — BPAM RBC
BLOOD PRODUCT EXPIRATION DATE: 201806202359
BLOOD PRODUCT EXPIRATION DATE: 201806232359
Blood Product Expiration Date: 201806172359
Blood Product Expiration Date: 201806192359
ISSUE DATE / TIME: 201805312234
ISSUE DATE / TIME: 201806010958
ISSUE DATE / TIME: 201806010049
ISSUE DATE / TIME: 201806011418
UNIT TYPE AND RH: 5100
UNIT TYPE AND RH: 5100
Unit Type and Rh: 1700
Unit Type and Rh: 1700

## 2016-10-27 LAB — HEMOGLOBIN AND HEMATOCRIT, BLOOD
HEMATOCRIT: 26.6 % — AB (ref 36.0–46.0)
HEMOGLOBIN: 8.8 g/dL — AB (ref 12.0–15.0)

## 2016-10-27 LAB — TYPE AND SCREEN
ABO/RH(D): B POS
Antibody Screen: NEGATIVE
UNIT DIVISION: 0
UNIT DIVISION: 0
Unit division: 0
Unit division: 0

## 2016-10-27 LAB — BASIC METABOLIC PANEL
Anion gap: 8 (ref 5–15)
BUN: 45 mg/dL — AB (ref 6–20)
CALCIUM: 8.5 mg/dL — AB (ref 8.9–10.3)
CHLORIDE: 105 mmol/L (ref 101–111)
CO2: 26 mmol/L (ref 22–32)
CREATININE: 2.67 mg/dL — AB (ref 0.44–1.00)
GFR calc Af Amer: 19 mL/min — ABNORMAL LOW (ref >60)
GFR calc non Af Amer: 16 mL/min — ABNORMAL LOW (ref >60)
Glucose, Bld: 209 mg/dL — ABNORMAL HIGH (ref 65–99)
Potassium: 4.4 mmol/L (ref 3.5–5.1)
Sodium: 139 mmol/L (ref 135–145)

## 2016-10-27 LAB — URINE CULTURE: CULTURE: NO GROWTH

## 2016-10-27 MED ORDER — POLYETHYLENE GLYCOL 3350 17 G PO PACK
17.0000 g | PACK | Freq: Every day | ORAL | Status: DC
  Administered 2016-10-27 – 2016-10-30 (×4): 17 g via ORAL
  Filled 2016-10-27 (×4): qty 1

## 2016-10-27 MED ORDER — FUROSEMIDE 20 MG PO TABS: 20.0000 mg | ORAL_TABLET | Freq: Every day | ORAL | Status: DC

## 2016-10-27 NOTE — Progress Notes (Signed)
  Subjective:  Patient states she feels better. She is not short of breath anymore. She denies chest pain. She says her appetite is good. She ate some of her breakfast. She denies abdominal pain. She has not had a BM today. Her friend [name] wants to know the plan of care.   Objective: Blood pressure (!) 121/53, pulse 77, temperature 97.8 F (36.6 C), temperature source Oral, resp. rate 19, height 5\' 4"  (1.626 m), weight 190 lb 0.6 oz (86.2 kg), SpO2 92 %. Patient is alert and appears to be comfortable.. Abdomen is full but soft and nontender without organomegaly or masses. No LE edema or clubbing noted.  Labs/studies Results:   Recent Labs  10/25/16 1026 10/25/16 2104 10/26/16 0454 10/26/16 1846 10/27/16 0040  WBC 10.2 9.5 8.8  --   --   HGB 4.4* 3.8* 6.3* 8.9* 8.8*  HCT 14.6* 12.6* 19.9* 26.6* 26.6*  PLT 439* 316 342  --   --     BMET   Recent Labs  10/25/16 2104 10/26/16 0454 10/27/16 0821  NA 138 139 139  K 5.1 4.4 4.4  CL 106 106 105  CO2 20* 23 26  GLUCOSE 229* 164* 209*  BUN 60* 58* 45*  CREATININE 2.76* 2.73* 2.67*  CALCIUM 7.9* 7.9* 8.5*    LFT   Recent Labs  10/25/16 1026 10/25/16 2104 10/26/16 0454  PROT 5.8* 5.7* 5.8*  ALBUMIN 3.3* 2.8* 2.9*  AST 23 36 38  ALT 18 21 23   ALKPHOS 49 41 40  BILITOT 0.3 0.5 0.7    PT/INR  No results for input(s): LABPROT, INR in the last 72 hours.  Hepatitis Panel  No results for input(s): HEPBSAG, HCVAB, HEPAIGM, HEPBIGM in the last 72 hours.   Assessment:  #1.Anemia. Anemia appears to be GI bleeding but stool was guaiac-negative yesterday. She possibly has been bleeding intermittently. EGD colonoscopy and small bowel given capsule study but 2 weeks [facility] at [facility] was nonrevealing. DAPT on hold for now. If feasible she will just be treated with one agent rather than 2. She will definitely need to be further evaluated. If stool is heme positive will proceed with GI bleeding scan otherwise will repeat small bowel given  capsule study. #2. CHF. She is doing better with diuretic therapy. Blood transfusion may have made her CHF worse. CHF possibly triggered by anemia in the background of diastolic dysfunction. #3. CAD. Patient had NSTEMI while at [facility] in April this year. Cardiac cath performed because of chronic kidney disease. She is being evaluated by Dr. Roderic Palau branch of cardiology service. #4. Chronic kidney disease. GFR around 16.   Recommendations:  Will do 3 more Hemoccults. If positive will proceed with GI bleeding scan or consider repeating small bowel given capsule study. CBC in a.m.   Please note patient's condition discussed with her friend Baxter Kail who is at bedside and also her son Liliane Channel was in [place].

## 2016-10-27 NOTE — Progress Notes (Signed)
Has not had a bowel movement this afternoon for stool test. Has been in chair for more than 3 hours. Walked around 1 time ICU.

## 2016-10-27 NOTE — Progress Notes (Signed)
PROGRESS NOTE    [name]  UUV:[id number] DOB: 1940-12-27 DOA: 10/25/2016 PCP: [name], MD    Brief Narrative:  [name]  is a 76 y.o. female, With history of CHF, anemia status post EGD, colonoscopy and capsule endoscopy done on 10/08/2016 which showed gastritis, was brought to hospital after patient was found unresponsive at home as per patient's husband. Patient has been feeling fatigued over past several days also complains of black stools but patient takes iron tablets. In the ED patient was found to have severe anemia with hemoglobin 3.8, 4 units PRBC ordered.  Patient complains of mild shortness of breath, no nausea vomiting. Denies vomiting frank blood or bloody stools. Patient has been started on aspirin and Plavix after she was thought to have non-STEMI. She denies chest pain, no abdominal pain. No dysuria.  She was transfused 4 units of PRBC and was seen by cardiology as well as gastroenterology.  She developed shortness of breath overnight from 6/1-6/2 and was given a dose of IV lasix.   Assessment & Plan:   Active Problems:   Anemia   Elevated troponin   Fatigue   Melena  Anemia - patient presented with severe anemia hemoglobin 3.8 - Gastroenterology consulted - s/p 4 units PRBC - H/H stable last two blood draws - just recently had EGD, colonoscopy and capsule study done  ? GI bleed - patient had EGD, colonoscopy as well as endoscopy recently, which only showed gastritis - GI consulted - patient has been on iron supplements - Continue Protonix 40 mg IV every 12 hours.  Diabetes mellitus - will hold oral hypoglycemic agents - start sliding scale insulin with NovoLog  - start carb modified/ heart healthy diet  Hypothyroidism -continue Synthroid  Elevated troponin -patient's troponin elevated to 0.27> 1.60> 1.8 > 1.36 (likely demand), EKG shows ST depression in lead 1, V4 V5 - cardiology consulted - could not get cardiac cath at time  of NSTEMI 2/2 renal function - holding dual antiplatelet 2/2 GI bleeding - recent NSTEMI  History of CHF/non-STEMI - will hold aspirin, Plavix at this time due to anemia - given 1 time dose of IV lasix last night - wean oxygen as tolerated  Hypertension - hold amlodipine, metoprolol at this time due to hypotension.   DVT prophylaxis: SCDs Code Status: Full Code Family Communication: no family bedside Disposition Plan: home pending anemia workup   Consultants:   Gastroenterology  Cardiology  Procedures:   none  Antimicrobials:   none    Subjective: Patient awake and alert.  States that she feels better today than she did yesterday.  Denies any abdominal pain and denies any bowel movements since admission.  Voices she became very short of breath last night but feels better after getting IV lasix.    Objective: Vitals:   10/27/16 0400 10/27/16 0500 10/27/16 0600 10/27/16 0700  BP: (!) 103/51 (!) 120/54 (!) 111/54 (!) 121/53  Pulse: 80 85 77 77  Resp: 18 19 17 19   Temp: 98.5 F (36.9 C)     TempSrc: Oral     SpO2: 93% 92% 94% 92%  Weight: 86.2 kg (190 lb 0.6 oz)     Height:        Intake/Output Summary (Last 24 hours) at 10/27/16 0750 Last data filed at 10/27/16 0400  Gross per 24 hour  Intake             1309 ml  Output  2900 ml  Net            -1591 ml   Filed Weights   10/25/16 2000 10/26/16 0215 10/27/16 0400  Weight: 85.3 kg (188 lb) 87.1 kg (192 lb 0.3 oz) 86.2 kg (190 lb 0.6 oz)    Examination:  General exam: Appears calm and comfortable  Respiratory system: respiratory effort normal. Few crackles in left middle lung and scattered bibasilar crackles otherwise right side clear Cardiovascular system: S1 & S2 heard, RRR. No JVD, murmurs, rubs, gallops or clicks. No pedal edema. Gastrointestinal system: Abdomen is nondistended, soft and nontender. No organomegaly or masses felt. Normal bowel sounds heard. Central nervous system: Alert and  oriented. No focal neurological deficits. Extremities: Symmetric 5 x 5 power. Skin: No rashes, lesions or ulcers Psychiatry: Judgement and insight appear normal. Mood & affect appropriate.     Data Reviewed: I have personally reviewed following labs and imaging studies  CBC:  Recent Labs Lab 10/25/16 1026 10/25/16 2104 10/26/16 0454 10/26/16 1846 10/27/16 0040  WBC 10.2 9.5 8.8  --   --   NEUTROABS 8,058* 7.9*  --   --   --   HGB 4.4* 3.8* 6.3* 8.9* 8.8*  HCT 14.6* 12.6* 19.9* 26.6* 26.6*  MCV 91.3 92.0 88.1  --   --   PLT 439* 316 342  --   --    Basic Metabolic Panel:  Recent Labs Lab 10/25/16 1026 10/25/16 2104 10/26/16 0454  NA 140 138 139  K 5.0 5.1 4.4  CL 106 106 106  CO2 21 20* 23  GLUCOSE 232* 229* 164*  BUN 57* 60* 58*  CREATININE 2.58* 2.76* 2.73*  CALCIUM 8.4* 7.9* 7.9*   GFR: Estimated Creatinine Clearance: 18.6 mL/min (A) (by C-G formula based on SCr of 2.73 mg/dL (H)). Liver Function Tests:  Recent Labs Lab 10/25/16 1026 10/25/16 2104 10/26/16 0454  AST 23 36 38  ALT 18 21 23   ALKPHOS 49 41 40  BILITOT 0.3 0.5 0.7  PROT 5.8* 5.7* 5.8*  ALBUMIN 3.3* 2.8* 2.9*   No results for input(s): LIPASE, AMYLASE in the last 168 hours. No results for input(s): AMMONIA in the last 168 hours. Coagulation Profile: No results for input(s): INR, PROTIME in the last 168 hours. Cardiac Enzymes:  Recent Labs Lab 10/25/16 2104 10/26/16 0454 10/26/16 0932 10/26/16 1846  TROPONINI 0.27* 1.60* 1.80* 1.36*   BNP (last 3 results) No results for input(s): PROBNP in the last 8760 hours. HbA1C: No results for input(s): HGBA1C in the last 72 hours. CBG:  Recent Labs Lab 10/26/16 0818 10/26/16 1146 10/26/16 1624 10/26/16 2155 10/27/16 0731  GLUCAP 135* 179* 161* 241* 200*   Lipid Profile: No results for input(s): CHOL, HDL, LDLCALC, TRIG, CHOLHDL, LDLDIRECT in the last 72 hours. Thyroid Function Tests: No results for input(s): TSH, T4TOTAL,  FREET4, T3FREE, THYROIDAB in the last 72 hours. Anemia Panel: No results for input(s): VITAMINB12, FOLATE, FERRITIN, TIBC, IRON, RETICCTPCT in the last 72 hours. Sepsis Labs:  Recent Labs Lab 10/25/16 2104  LATICACIDVEN 3.6*    Recent Results (from the past 240 hour(s))  Culture, blood (routine x 2)     Status: None (Preliminary result)   Collection Time: 10/25/16  9:04 PM  Result Value Ref Range Status   Specimen Description LEFT ANTECUBITAL  Final   Special Requests   Final    BOTTLES DRAWN AEROBIC AND ANAEROBIC Blood Culture results may not be optimal due to an inadequate volume of blood received in  culture bottles   Culture NO GROWTH < 12 HOURS  Final   Report Status PENDING  Incomplete  Culture, blood (routine x 2)     Status: None (Preliminary result)   Collection Time: 10/25/16  9:04 PM  Result Value Ref Range Status   Specimen Description BLOOD LEFT HAND  Final   Special Requests   Final    BOTTLES DRAWN AEROBIC AND ANAEROBIC Blood Culture adequate volume   Culture NO GROWTH < 12 HOURS  Final   Report Status PENDING  Incomplete  MRSA PCR Screening     Status: None   Collection Time: 10/26/16  1:56 AM  Result Value Ref Range Status   MRSA by PCR NEGATIVE NEGATIVE Final    Comment:        The GeneXpert MRSA Assay (FDA approved for NASAL specimens only), is one component of a comprehensive MRSA colonization surveillance program. It is not intended to diagnose MRSA infection nor to guide or monitor treatment for MRSA infections.          Radiology Studies: Dg Chest 2 View  Result Date: 10/25/2016 CLINICAL DATA:  Weakness and fever EXAM: CHEST  2 VIEW COMPARISON:  09/17/2016 FINDINGS: Improved aeration since the prior study. Minimal streaky atelectasis or infiltrate at the left base. No large effusion. Stable cardiomegaly with mild central vascular congestion. No pneumothorax. Multiple surgical screws partially visualized in the proximal left humerus.  IMPRESSION: 1. Improved aeration of right lung base since the prior study 2. Minimal streaky atelectasis versus mild infiltrate left lung base 3. Stable cardiomegaly with mild central vascular congestion Electronically Signed   By: Donavan Foil M.D.   On: 10/25/2016 22:16   Ct Head Wo Contrast  Result Date: 10/25/2016 CLINICAL DATA:  76 year old female with generalized weakness today. Fever of 101 F. EXAM: CT HEAD WITHOUT CONTRAST TECHNIQUE: Contiguous axial images were obtained from the base of the skull through the vertex without intravenous contrast. COMPARISON:  None. FINDINGS: Brain: Cerebral volume is within normal limits for age. Mild for age hypodensity in the right corona radiata. Other gray -white matter differentiation is within normal limits throughout the brain. No midline shift, ventriculomegaly, mass effect, evidence of mass lesion, intracranial hemorrhage or evidence of cortically based acute infarction. Vascular: Calcified atherosclerosis at the skull base. Skull: Hyperostosis of the calvarium, normal variant. No acute osseous abnormality identified. Sinuses/Orbits: Mild mostly right maxillary sinus mucosal thickening. No fluid levels or bubbly opacity. Tympanic cavities and mastoids are clear. Other: Postoperative changes to the left globe. Otherwise negative visualized noncontrast orbit, scalp, and face soft tissues. IMPRESSION: 1. Mild for age nonspecific white matter hypodensity in the right corona radiata, such as due to age indeterminate small vessel disease. Otherwise normal for age noncontrast CT appearance of the brain. 2. Mild maxillary sinus inflammation, significance doubtful. Electronically Signed   By: Genevie Ann M.D.   On: 10/25/2016 21:59   Dg Chest Port 1 View  Result Date: 10/27/2016 CLINICAL DATA:  Pulmonary edema, weakness and fever EXAM: PORTABLE CHEST 1 VIEW COMPARISON:  10/25/2016 FINDINGS: Postsurgical changes of the proximal left humerus. Stable cardiomegaly.  Development of diffuse opacity in the left thorax with patchy opacity at the right base. Possible small left effusion. No pneumothorax. IMPRESSION: 1. Cardiomegaly. Interim development of diffuse opacity in the left thorax which may reflect asymmetric edema or pneumonia. Increased infiltrate at the right base. Suspect that there is a small left pleural effusion. Electronically Signed   By: Madie Reno.D.  On: 10/27/2016 00:43        Scheduled Meds: . ferrous sulfate  325 mg Oral Q breakfast  . insulin aspart  0-9 Units Subcutaneous TID WC  . levothyroxine  25 mcg Oral QAC breakfast  . mouth rinse  15 mL Mouth Rinse BID  . pantoprazole (PROTONIX) IV  40 mg Intravenous Q12H   Continuous Infusions: . sodium chloride    . sodium chloride       LOS: 1 day    Time spent: 30 minutes    Loretha Stapler, MD [facility] Pager [phone]  If 7PM-7AM, please contact night-coverage [email or web address] Password [email or web address] 10/27/2016, 7:50 AM

## 2016-10-27 NOTE — Progress Notes (Signed)
Patient stated she does not wear oxygen at home and asked to walk without oxygen. In chair saturations were 93% on 5 lpm. While walking without oxygen saturations dropped to 79%. After replacing oxygen saturations within 1 minute increased to 93% on 5 lpm oxygen.

## 2016-10-28 ENCOUNTER — Encounter: Payer: Self-pay | Admitting: Cardiology

## 2016-10-28 ENCOUNTER — Encounter: Payer: Self-pay | Admitting: Family Medicine

## 2016-10-28 ENCOUNTER — Encounter: Payer: Self-pay | Admitting: Physician Assistant

## 2016-10-28 ENCOUNTER — Encounter: Payer: Self-pay | Admitting: Gastroenterology

## 2016-10-28 LAB — GLUCOSE, CAPILLARY
GLUCOSE-CAPILLARY: 178 mg/dL — AB (ref 65–99)
Glucose-Capillary: 133 mg/dL — ABNORMAL HIGH (ref 65–99)
Glucose-Capillary: 225 mg/dL — ABNORMAL HIGH (ref 65–99)

## 2016-10-28 LAB — CBC
HEMATOCRIT: 26.1 % — AB (ref 36.0–46.0)
HEMOGLOBIN: 8.4 g/dL — AB (ref 12.0–15.0)
MCH: 28.9 pg (ref 26.0–34.0)
MCHC: 32.2 g/dL (ref 30.0–36.0)
MCV: 89.7 fL (ref 78.0–100.0)
Platelets: 321 10*3/uL (ref 150–400)
RBC: 2.91 MIL/uL — ABNORMAL LOW (ref 3.87–5.11)
RDW: 15.4 % (ref 11.5–15.5)
WBC: 9.8 10*3/uL (ref 4.0–10.5)

## 2016-10-28 LAB — HEMOGLOBIN A1C
Hgb A1c MFr Bld: 5.3 % (ref 4.8–5.6)
Mean Plasma Glucose: 105 mg/dL

## 2016-10-28 LAB — HEMOGLOBIN AND HEMATOCRIT, BLOOD
HCT: 26 % — ABNORMAL LOW (ref 36.0–46.0)
Hemoglobin: 8.4 g/dL — ABNORMAL LOW (ref 12.0–15.0)

## 2016-10-28 MED ORDER — SENNOSIDES-DOCUSATE SODIUM 8.6-50 MG PO TABS
1.0000 | ORAL_TABLET | Freq: Two times a day (BID) | ORAL | Status: DC
  Administered 2016-10-28 – 2016-10-30 (×4): 1 via ORAL
  Filled 2016-10-28 (×4): qty 1

## 2016-10-28 MED ORDER — FUROSEMIDE 20 MG PO TABS
20.0000 mg | ORAL_TABLET | Freq: Every day | ORAL | Status: DC
  Administered 2016-10-28 – 2016-10-30 (×3): 20 mg via ORAL
  Filled 2016-10-28 (×3): qty 1

## 2016-10-28 NOTE — Progress Notes (Signed)
  Subjective:  Patient continues to feel better. She states she walked in the hall yesterday. She did not feel short of breath. She states she has not had a BM since admission. She states she has been constipated since she has been on iron and not eating well. She denies chest pain.   Objective: Blood pressure (!) 129/53, pulse 76, temperature 98.9 F (37.2 C), temperature source Oral, resp. rate 15, height 5\' 4"  (1.626 m), weight 190 lb 0.6 oz (86.2 kg), SpO2 97 %. Patient is alert. She is sitting in chair and appears to be comfortable. Conjunctiva is pale and sclera nonicteric. Auscultation of lungs revealed bibasilar crackles. Heart with regular rhythm normal S1 and S2. No murmur noted. Abdomen is full but soft and nontender without organomegaly or masses. No LE edema noted.  Labs/studies Results:   Recent Labs  10/25/16 2104 10/26/16 0454 10/26/16 1846 10/27/16 0040 10/28/16 0427  WBC 9.5 8.8  --   --  9.8  HGB 3.8* 6.3* 8.9* 8.8* 8.4*  HCT 12.6* 19.9* 26.6* 26.6* 26.1*  PLT 316 342  --   --  321    BMET   Recent Labs  10/25/16 2104 10/26/16 0454 10/27/16 0821  NA 138 139 139  K 5.1 4.4 4.4  CL 106 106 105  CO2 20* 23 26  GLUCOSE 229* 164* 209*  BUN 60* 58* 45*  CREATININE 2.76* 2.73* 2.67*  CALCIUM 7.9* 7.9* 8.5*    LFT   Recent Labs  10/25/16 2104 10/26/16 0454  PROT 5.7* 5.8*  ALBUMIN 2.8* 2.9*  AST 36 38  ALT 21 23  ALKPHOS 41 40  BILITOT 0.5 0.7    PT/INR  No results for input(s): LABPROT, INR in the last 72 hours.  Hepatitis Panel  No results for input(s): HEPBSAG, HCVAB, HEPAIGM, HEPBIGM in the last 72 hours.   Assessment:  #1.Anemia. Hemoglobin has dropped by 0.4 g since yesterday. No evidence of overt GI bleed. First stool was guaiac negative and subsequent stools have not been tested as she is not had a BM. Patient is on oral iron which may be contribution to her constipation. She has received enough iron dose to transfusion and  therefore by mouth iron will be stopped. If stool remains guaiac negative she may not need further workup and hopefully she can be maintained on one antiplatelet agent rather than 2. #2. CHF. She is doing better with diuretic therapy she still has bibasilar crackles.. #3. CAD. Patient's remains chest pain-free. #4. Chronic kidney disease. Marland Kitchen   Recommendations:  CBC and metabolic 7 in a.m. Discontinue by mouth iron.

## 2016-10-28 NOTE — Progress Notes (Signed)
PROGRESS NOTE    [name]  MHD:[id number] DOB: 10/16/1940 DOA: 10/25/2016 PCP: [name], MD    Brief Narrative:  [name]  is a 76 y.o. female, With history of CHF, anemia status post EGD, colonoscopy and capsule endoscopy done on 10/08/2016 which showed gastritis, was brought to hospital after patient was found unresponsive at home as per patient's husband. Patient has been feeling fatigued over past several days also complains of black stools but patient takes iron tablets. In the ED patient was found to have severe anemia with hemoglobin 3.8, 4 units PRBC ordered.  Patient complains of mild shortness of breath, no nausea vomiting. Denies vomiting frank blood or bloody stools. Patient has been started on aspirin and Plavix after she was thought to have non-STEMI. She denies chest pain, no abdominal pain. No dysuria.  She was transfused 4 units of PRBC and was seen by cardiology as well as gastroenterology.  She developed shortness of breath overnight from 6/1-6/2 and was given a dose of IV lasix.   Assessment & Plan:   Active Problems:   Anemia   Elevated troponin   Fatigue   Melena  Anemia - hemoglobin on presentation 3.8 - Gastroenterology following - s/p 4 units PRBC - serial H/H q12h - just recently had EGD, colonoscopy and capsule study done two weeks ago - repeat small bowel capsule study vs GI bleeding scan  ? GI bleed - patient had EGD, colonoscopy as well as endoscopy slightly more than 2 weeks ago, which only showed gastritis - GI consulted - Continue Protonix 40 mg IV every 12 hours.  Diabetes mellitus - will hold oral hypoglycemic agents - start sliding scale insulin with NovoLog  - start carb modified/ heart healthy diet  Hypothyroidism -continue Synthroid  Elevated troponin -patient's troponin elevated to 0.27> 1.60> 1.8 > 1.36 (likely demand), EKG shows ST depression in lead 1, V4 V5 - cardiology consulted - could not get cardiac  cath at time of NSTEMI 2/2 renal function - holding dual antiplatelet 2/2 GI bleeding - recent NSTEMI  History of CHF/non-STEMI - will hold aspirin, Plavix at this time due to anemia -  Restart PO lasix daily - repeat daily BMP - wean oxygen as tolerated  Hypertension - hold amlodipine, metoprolol at this time due to hypotension.   DVT prophylaxis: SCDs Code Status: Full Code Family Communication: no family bedside Disposition Plan: home pending anemia workup   Consultants:   Gastroenterology  Cardiology  Procedures:   none  Antimicrobials:   none    Subjective: Patient sitting on side of bed.  Reports that she sat in her chair for a good portion of the afternoon yesterday and did walk around outside the ICU.  She voices she has not had a bowel movement.    Objective: Vitals:   10/28/16 0500 10/28/16 0600 10/28/16 0700 10/28/16 0800  BP: (!) 118/48 (!) 114/48 (!) 109/41 (!) 129/53  Pulse: 73 74 67 76  Resp: 18 18 17 15   Temp:    98.9 F (37.2 C)  TempSrc:    Oral  SpO2: 96% 96% 97% 97%  Weight:      Height:        Intake/Output Summary (Last 24 hours) at 10/28/16 0833 Last data filed at 10/28/16 0600  Gross per 24 hour  Intake              720 ml  Output  1650 ml  Net             -930 ml   Filed Weights   10/25/16 2000 10/26/16 0215 10/27/16 0400  Weight: 85.3 kg (188 lb) 87.1 kg (192 lb 0.3 oz) 86.2 kg (190 lb 0.6 oz)    Examination:  General exam:Appears calm and comfortable  Respiratory system: bibasilar crackles, scattered rhonchi bilaterally Cardiovascular system:S1 &S2 heard, RRR. No JVD, murmurs, rubs, gallops or clicks. No pedal edema. Gastrointestinal system:Abdomen is nondistended, soft and nontender. No organomegaly or masses felt. Normal bowel sounds heard. Central nervous system:Alert and oriented. No focal neurological deficits. Extremities: Symmetric 5 x 5 power. Skin: No rashes, lesions or  ulcers Psychiatry:Judgement and insight appear normal. Mood &affect appropriate    Data Reviewed: I have personally reviewed following labs and imaging studies  CBC:  Recent Labs Lab 10/25/16 1026 10/25/16 2104 10/26/16 0454 10/26/16 1846 10/27/16 0040 10/28/16 0427  WBC 10.2 9.5 8.8  --   --  9.8  NEUTROABS 8,058* 7.9*  --   --   --   --   HGB 4.4* 3.8* 6.3* 8.9* 8.8* 8.4*  HCT 14.6* 12.6* 19.9* 26.6* 26.6* 26.1*  MCV 91.3 92.0 88.1  --   --  89.7  PLT 439* 316 342  --   --  650   Basic Metabolic Panel:  Recent Labs Lab 10/25/16 1026 10/25/16 2104 10/26/16 0454 10/27/16 0821  NA 140 138 139 139  K 5.0 5.1 4.4 4.4  CL 106 106 106 105  CO2 21 20* 23 26  GLUCOSE 232* 229* 164* 209*  BUN 57* 60* 58* 45*  CREATININE 2.58* 2.76* 2.73* 2.67*  CALCIUM 8.4* 7.9* 7.9* 8.5*   GFR: Estimated Creatinine Clearance: 19 mL/min (A) (by C-G formula based on SCr of 2.67 mg/dL (H)). Liver Function Tests:  Recent Labs Lab 10/25/16 1026 10/25/16 2104 10/26/16 0454  AST 23 36 38  ALT 18 21 23   ALKPHOS 49 41 40  BILITOT 0.3 0.5 0.7  PROT 5.8* 5.7* 5.8*  ALBUMIN 3.3* 2.8* 2.9*   No results for input(s): LIPASE, AMYLASE in the last 168 hours. No results for input(s): AMMONIA in the last 168 hours. Coagulation Profile: No results for input(s): INR, PROTIME in the last 168 hours. Cardiac Enzymes:  Recent Labs Lab 10/25/16 2104 10/26/16 0454 10/26/16 0932 10/26/16 1846  TROPONINI 0.27* 1.60* 1.80* 1.36*   BNP (last 3 results) No results for input(s): PROBNP in the last 8760 hours. HbA1C: No results for input(s): HGBA1C in the last 72 hours. CBG:  Recent Labs Lab 10/27/16 0731 10/27/16 1116 10/27/16 1651 10/27/16 2116 10/28/16 0753  GLUCAP 200* 207* 156* 173* 133*   Lipid Profile: No results for input(s): CHOL, HDL, LDLCALC, TRIG, CHOLHDL, LDLDIRECT in the last 72 hours. Thyroid Function Tests: No results for input(s): TSH, T4TOTAL, FREET4, T3FREE,  THYROIDAB in the last 72 hours. Anemia Panel: No results for input(s): VITAMINB12, FOLATE, FERRITIN, TIBC, IRON, RETICCTPCT in the last 72 hours. Sepsis Labs:  Recent Labs Lab 10/25/16 2104  LATICACIDVEN 3.6*    Recent Results (from the past 240 hour(s))  Urine culture     Status: None   Collection Time: 10/25/16  8:45 PM  Result Value Ref Range Status   Specimen Description URINE, RANDOM  Final   Special Requests NONE  Final   Culture   Final    NO GROWTH Performed at [facility], [address] [place], [place] [address]    Report  Status 10/27/2016 FINAL  Final  Culture, blood (routine x 2)     Status: None (Preliminary result)   Collection Time: 10/25/16  9:04 PM  Result Value Ref Range Status   Specimen Description LEFT ANTECUBITAL  Final   Special Requests   Final    BOTTLES DRAWN AEROBIC AND ANAEROBIC Blood Culture results may not be optimal due to an inadequate volume of blood received in culture bottles   Culture NO GROWTH 3 DAYS  Final   Report Status PENDING  Incomplete  Culture, blood (routine x 2)     Status: None (Preliminary result)   Collection Time: 10/25/16  9:04 PM  Result Value Ref Range Status   Specimen Description BLOOD LEFT HAND  Final   Special Requests   Final    BOTTLES DRAWN AEROBIC AND ANAEROBIC Blood Culture adequate volume   Culture NO GROWTH 3 DAYS  Final   Report Status PENDING  Incomplete  MRSA PCR Screening     Status: None   Collection Time: 10/26/16  1:56 AM  Result Value Ref Range Status   MRSA by PCR NEGATIVE NEGATIVE Final    Comment:        The GeneXpert MRSA Assay (FDA approved for NASAL specimens only), is one component of a comprehensive MRSA colonization surveillance program. It is not intended to diagnose MRSA infection nor to guide or monitor treatment for MRSA infections.          Radiology Studies: Dg Chest Port 1 View  Result Date: 10/27/2016 CLINICAL DATA:  Pulmonary edema, weakness and fever EXAM:  PORTABLE CHEST 1 VIEW COMPARISON:  10/25/2016 FINDINGS: Postsurgical changes of the proximal left humerus. Stable cardiomegaly. Development of diffuse opacity in the left thorax with patchy opacity at the right base. Possible small left effusion. No pneumothorax. IMPRESSION: 1. Cardiomegaly. Interim development of diffuse opacity in the left thorax which may reflect asymmetric edema or pneumonia. Increased infiltrate at the right base. Suspect that there is a small left pleural effusion. Electronically Signed   By: Donavan Foil M.D.   On: 10/27/2016 00:43        Scheduled Meds: . ferrous sulfate  325 mg Oral Q breakfast  . insulin aspart  0-9 Units Subcutaneous TID WC  . levothyroxine  25 mcg Oral QAC breakfast  . mouth rinse  15 mL Mouth Rinse BID  . pantoprazole (PROTONIX) IV  40 mg Intravenous Q12H  . polyethylene glycol  17 g Oral Daily   Continuous Infusions: . sodium chloride    . sodium chloride       LOS: 2 days    Time spent: 30 minutes    Loretha Stapler, MD [facility] Pager [phone]  If 7PM-7AM, please contact night-coverage [email or web address] Password [facility] 10/28/2016, 8:33 AM

## 2016-10-29 ENCOUNTER — Encounter (HOSPITAL_COMMUNITY): Payer: Self-pay | Admitting: Cardiology

## 2016-10-29 ENCOUNTER — Encounter: Payer: Self-pay | Admitting: Gastroenterology

## 2016-10-29 ENCOUNTER — Telehealth: Payer: Self-pay | Admitting: Gastroenterology

## 2016-10-29 ENCOUNTER — Encounter: Payer: Self-pay | Admitting: General Practice

## 2016-10-29 ENCOUNTER — Encounter (HOSPITAL_COMMUNITY)
Admission: EM | Disposition: A | Discharge status: Home / Self Care | Payer: Self-pay | Source: Home / Self Care | Attending: Family Medicine

## 2016-10-29 DIAGNOSIS — N179 Acute kidney failure, unspecified: Secondary | ICD-10-CM

## 2016-10-29 DIAGNOSIS — N189 Chronic kidney disease, unspecified: Secondary | ICD-10-CM

## 2016-10-29 HISTORY — PX: GIVENS CAPSULE STUDY: SHX5432

## 2016-10-29 LAB — BASIC METABOLIC PANEL
Anion gap: 7 (ref 5–15)
BUN: 46 mg/dL — AB (ref 6–20)
CALCIUM: 8.2 mg/dL — AB (ref 8.9–10.3)
CO2: 27 mmol/L (ref 22–32)
CREATININE: 2.25 mg/dL — AB (ref 0.44–1.00)
Chloride: 104 mmol/L (ref 101–111)
GFR, EST AFRICAN AMERICAN: 23 mL/min — AB (ref >60)
GFR, EST NON AFRICAN AMERICAN: 20 mL/min — AB (ref >60)
Glucose, Bld: 160 mg/dL — ABNORMAL HIGH (ref 65–99)
Potassium: 4.6 mmol/L (ref 3.5–5.1)
SODIUM: 138 mmol/L (ref 135–145)

## 2016-10-29 LAB — GLUCOSE, CAPILLARY
GLUCOSE-CAPILLARY: 293 mg/dL — AB (ref 65–99)
GLUCOSE-CAPILLARY: 146 mg/dL — AB (ref 65–99)
Glucose-Capillary: 144 mg/dL — ABNORMAL HIGH (ref 65–99)
Glucose-Capillary: 148 mg/dL — ABNORMAL HIGH (ref 65–99)
Glucose-Capillary: 175 mg/dL — ABNORMAL HIGH (ref 65–99)
Glucose-Capillary: 187 mg/dL — ABNORMAL HIGH (ref 65–99)

## 2016-10-29 LAB — OCCULT BLOOD X 1 CARD TO LAB, STOOL
FECAL OCCULT BLD: POSITIVE — AB
FECAL OCCULT BLD: POSITIVE — AB

## 2016-10-29 LAB — CBC
HCT: 26.5 % — ABNORMAL LOW (ref 36.0–46.0)
HEMOGLOBIN: 8.4 g/dL — AB (ref 12.0–15.0)
MCH: 28.6 pg (ref 26.0–34.0)
MCHC: 31.7 g/dL (ref 30.0–36.0)
MCV: 90.1 fL (ref 78.0–100.0)
PLATELETS: 330 10*3/uL (ref 150–400)
RBC: 2.94 MIL/uL — ABNORMAL LOW (ref 3.87–5.11)
RDW: 14.9 % (ref 11.5–15.5)
WBC: 8.4 10*3/uL (ref 4.0–10.5)

## 2016-10-29 LAB — HEMOGLOBIN AND HEMATOCRIT, BLOOD
HCT: 27.7 % — ABNORMAL LOW (ref 36.0–46.0)
HEMATOCRIT: 28.2 % — AB (ref 36.0–46.0)
HEMOGLOBIN: 8.9 g/dL — AB (ref 12.0–15.0)
Hemoglobin: 9 g/dL — ABNORMAL LOW (ref 12.0–15.0)

## 2016-10-29 SURGERY — IMAGING PROCEDURE, GI TRACT, INTRALUMINAL, VIA CAPSULE

## 2016-10-29 MED ORDER — DEXTROSE-NACL 5-0.9 % IV SOLN
INTRAVENOUS | Status: DC
  Administered 2016-10-29 – 2016-10-30 (×2): via INTRAVENOUS

## 2016-10-29 MED ORDER — INSULIN ASPART 100 UNIT/ML ~~LOC~~ SOLN
0.0000 [IU] | SUBCUTANEOUS | Status: DC
  Administered 2016-10-29: 1 [IU] via SUBCUTANEOUS
  Administered 2016-10-30: 2 [IU] via SUBCUTANEOUS
  Administered 2016-10-30 (×2): 1 [IU] via SUBCUTANEOUS

## 2016-10-29 MED ORDER — PANTOPRAZOLE SODIUM 40 MG PO TBEC
40.0000 mg | DELAYED_RELEASE_TABLET | Freq: Every day | ORAL | Status: DC
  Administered 2016-10-30: 40 mg via ORAL
  Filled 2016-10-29: qty 1

## 2016-10-29 MED ORDER — SIMETHICONE 40 MG/0.6ML PO SUSP
ORAL | Status: AC
  Administered 2016-10-29: 20:00:00
  Filled 2016-10-29: qty 0.6

## 2016-10-29 MED ORDER — SODIUM CHLORIDE 0.9 % IV SOLN: INTRAVENOUS | Status: DC

## 2016-10-29 NOTE — Telephone Encounter (Signed)
[name]: Patient would like to keep GI care closer to her hometown of [place]. Was seen by [facility] while inpatient during hospitalization in May, but I do not believe she was ever seen as an outpatient. She already has an appt that was previously arranged for July 2018. Please have this moved up to about 2 weeks from now.  [name]: Patient also needs CBC in 1 week as outpatient at the lab.   [name]: Finally, I spoke with patient regarding caregiver's access to MyChart. She is agreeable to having Shirlean Schlein as a proxy user.

## 2016-10-29 NOTE — Progress Notes (Signed)
Subjective: No overt GI bleeding. Wants to go home. Paschal Dopp", at bedside. Fecal impaction this morning addressed by nursing with brown/dark stool, and hemoccults sent to lab.   Objective: Vital signs in last 24 hours: Temp:  [98.3 F (36.8 C)-99.4 F (37.4 C)] 98.4 F (36.9 C) (06/04 0745) Pulse Rate:  [67-107] 77 (06/04 0745) Resp:  [14-25] 16 (06/04 0500) BP: (110-147)/(43-108) 139/55 (06/04 0600) SpO2:  [86 %-100 %] 100 % (06/04 0745) Weight:  [192 lb 0.3 oz (87.1 kg)] 192 lb 0.3 oz (87.1 kg) (06/04 0500) Last BM Date: 10/25/16 General:   Alert and oriented, pleasant Head:  Normocephalic and atraumatic. Eyes:  No icterus, sclera clear. Conjuctiva pink.  Abdomen:  Bowel sounds present, soft, non-tender, non-distended. Extremities:  Without edema. Neurologic:  Alert and  oriented x4 Psych:  Alert and cooperative. Normal mood and affect.  Intake/Output from previous day: 06/03 0701 - 06/04 0700 In: 840 [P.O.:840] Out: 1775 [Urine:1775] Intake/Output this shift: No intake/output data recorded.  Lab Results:  Recent Labs  10/28/16 0427 10/28/16 1455 10/29/16 0454  WBC 9.8  --  8.4  HGB 8.4* 8.4* 8.4*  HCT 26.1* 26.0* 26.5*  PLT 321  --  330   BMET  Recent Labs  10/27/16 0821 10/29/16 0454  NA 139 138  K 4.4 4.6  CL 105 104  CO2 26 27  GLUCOSE 209* 160*  BUN 45* 46*  CREATININE 2.67* 2.25*  CALCIUM 8.5* 8.2*    Assessment: 76 year old female with multiple medical issues, presenting with profound anemia with Hgb 3.8 and just recently evaluated with EGD, colonoscopy, and capsule in [place] mid May 2018 by [facility] GI due to symptomatic anemia without revealing source. Hemoccult negative on exam on admission with subsequent hemoccults ordered. Recent NSTEMI April 2018. Complicated scenario. Hgb remains stable at 8.4 and without overt GI bleeding.  At least 30 minutes were spent with the patient and her friend, [name], who had sent multiple MyChart emails  to all providers. I discussed the use of the patient emails and how these are not for urgent scenarios, as well as these were completed over the weekend when many are not on call. I also discussed that he would need to be identified as a proxy user per patient, as it is not appropriate to use the patient's own account for his use. He stated understanding, and the patient agreed to allow access by Shirlean Schlein as a proxy user. Patient would like to keep care with [facility], as she lives in [place] and transportation can be an issue to [place]. Her upcoming appointment with [place] is not until July, so I assured her this would be moved up sooner. Will also need to follow blood work serially as an outpatient. Finally, she was on Plavix and aspirin prior to admission due to history of NSTEMI recently. Complicated scenario.   Could consider repeat capsule study, although no guarantee an obvious small bowel lesion could be found. Without overt GI bleeding, bleeding scan would not yield much information. Will await hemoccult findings and determine possible repeat capsule study vs monitoring closely as outpatient with serial blood work.   Plan: Await hemoccult results Will discuss with Dr. Oneida Alar resumption of Plavix and aspirin Will arrange closer follow-up with our practice Outpatient CBC in 1 week from today Will arrange proxy user settings for Shirlean Schlein, patient's caregiver and friend   Annitta Needs, PhD, ANP-BC [facility]enter[facility]    LOS: 3 days    10/29/2016,  7:55 AM

## 2016-10-29 NOTE — Care Management Important Message (Signed)
Important Message  Patient Details  Name: [name] MRN: [id number] Date of Birth: [birth year]   Medicare Important Message Given:  Yes    [name], Chauncey Reading, RN 10/29/2016, 11:52 AM

## 2016-10-29 NOTE — Plan of Care (Signed)
Problem: Bowel/Gastric: Goal: Will not experience complications related to bowel motility Outcome: Progressing LARGE IMPACTION REMOVED. PT STARTED ON MIRALAX

## 2016-10-29 NOTE — Progress Notes (Signed)
LARGE RECTAL IMPACTION BROKEN UP PT TOLERATED WELL AND IS MUCH MORE COMFORTABLE NOW. OCCULT STOOL SPEC OBTAINED EAIRLIER AND SPEC OBTAINED NOW.

## 2016-10-29 NOTE — Care Management Note (Addendum)
Case Management Note  Patient Details  Name: [name] MRN: [id number] Date of Birth: 1941-03-31  Subjective/Objective:  Adm with anemia, GIB, s/p 4 units PRBC this visit. From home with a friend who helps take care of her. She has a PCP who recently sent a referral for RW and WC to [facility] per [name] (friend). CM will f/u on this. Currently on O2, no oxygen PTA.                  Action/Plan: Anticipate DC home. CM will follow for oxygen needs and f/u on RW/WC order.   Addendum: CM spoke with [facility], they report not having any orders for RW or WC.  Patient would like WC from [facility] delivered to room prior to discharge.  No oxygen needs, patient ambulated around nurses station twice without desaturation.    Expected Discharge Date:       10/30/2016           Expected Discharge Plan:  Home/Self Care  In-House Referral:     Discharge planning Services  CM Consult  Post Acute Care Choice:    Choice offered to:     DME Arranged:    DME Agency:     HH Arranged:    HH Agency:     Status of Service:  In process, will continue to follow  If discussed at Long Length of Stay Meetings, dates discussed:    Additional Comments:  Jalyric Kaestner, Chauncey Reading, RN 10/29/2016, 11:27 AM

## 2016-10-29 NOTE — Care Management (Signed)
    Durable Medical Equipment        Start     Ordered   10/29/16 1432  For home use only DME standard manual wheelchair with seat cushion  Once    Comments:  Patient suffers from anemia which impairs their ability to perform daily activities like walking long distances to doctor offices/appointmetns. A Rolling walker will not resolve  issue with performing activities of daily living. A wheelchair will allow patient to safely perform daily activities. Patient can safely propel the wheelchair in the home or has a caregiver who can provide assistance.  Accessories: elevating leg rests (ELRs), wheel locks, extensions and anti-tippers.   10/29/16 1433

## 2016-10-29 NOTE — Telephone Encounter (Signed)
PATIENT MOVED TO June 19 AND SHE IS AWARE OF NEW APPOINTMENT DATE AND TIME.

## 2016-10-29 NOTE — Telephone Encounter (Signed)
  Bob's email address is [email or web address], if you need.

## 2016-10-29 NOTE — Progress Notes (Signed)
PT WALKED AROUND ICU NURSES STATION X2. O2 SAT STAYED 94% ON ROOM AIR. TOLERATED WALKING WELL. ALSO, MR BOB NASON GIVEN INSTRUCTIONS FROM MD'S OFFICE AS PROXY TO MRS Hochman;S MY CHART ACCOUNT.

## 2016-10-29 NOTE — Progress Notes (Signed)
CONSENT SIGHED FOR GIVENS CAPSULE STUDY

## 2016-10-29 NOTE — Telephone Encounter (Signed)
I spoke with the patient's nurse(Cindy, RN [phone]) and made her aware that I added Mr Estell Harpin as a proxy to Ms. Balbuena's My Chart account.  I [phone]) the My Chart access Code along with instructions for Mr. Nason to set up his My Chart access.

## 2016-10-29 NOTE — Progress Notes (Signed)
PROGRESS NOTE    JANKI DIKE  XTK:[id number] DOB: March 30, 1941 DOA: 10/25/2016 PCP: [name], MD    Brief Narrative:  [name]  is a 76 y.o. female, With history of CHF, anemia status post EGD, colonoscopy and capsule endoscopy done on 10/08/2016 which showed gastritis, was brought to hospital after patient was found unresponsive at home as per patient's husband. Patient has been feeling fatigued over past several days also complains of black stools but patient takes iron tablets. In the ED patient was found to have severe anemia with hemoglobin 3.8, 4 units PRBC ordered.  Patient complains of mild shortness of breath, no nausea vomiting. Denies vomiting frank blood or bloody stools. Patient has been started on aspirin and Plavix after she was thought to have non-STEMI. She denies chest pain, no abdominal pain. No dysuria.  She was transfused 4 units of PRBC and was seen by cardiology as well as gastroenterology.  She developed shortness of breath overnight from 6/1-6/2 and was given a dose of IV lasix.  Her H/H stabilized but on 10/29/16 her hemoccult was positive.    Assessment & Plan:   Active Problems:   Anemia   Elevated troponin   Fatigue   Melena  Anemia - hemoglobin on presentation 3.8 - Gastroenterology following - s/p 4 units PRBC - serial H/H q12h - H/H has remained stable - just recently had EGD, colonoscopy and capsule study done two weeks ago - hemoccult stool this am positive - will repeat capsule study tomorrow - will place NPO after midnight  ? GI bleed - patient had EGD, colonoscopy as well as endoscopy slightly more than 2 weeks ago, which only showed gastritis - GI following - Continue Protonix 40 mg IV every 12 hours.  Diabetes mellitus - will hold oral hypoglycemic agents - start sliding scale insulin with NovoLog  - carb modified/ heart healthy diet  Hypothyroidism -continue Synthroid  Elevated troponin -patient's troponin  elevated to 0.27> 1.60> 1.8 > 1.36 (likely demand), EKG shows ST depression in lead 1, V4 V5 - cardiology consulted - could not get cardiac cath at time of recent NSTEMI 2/2 renal function - holding dual antiplatelet 2/2 GI bleeding  History of CHF/non-STEMI - will hold aspirin, Plavix at this time due to anemia -  Restart PO lasix daily - repeat daily BMP - patient off oxygen today  Hypertension - hold amlodipine, metoprolol at this time due to hypotension.   DVT prophylaxis: SCDs Code Status: Full Code Family Communication: family friend [name] is bedside Disposition Plan: home pending anemia workup   Consultants:   Gastroenterology  Cardiology  Procedures:   none  Antimicrobials:   none    Subjective: Patient is sitting up in bed and her friend "[name]" is bedside.  She reports she feels well and is hopeful to discharge today.  Was disempacted this am and stool was sent for hemoccult.    Objective: Vitals:   10/29/16 1113 10/29/16 1233 10/29/16 1300 10/29/16 1400  BP:      Pulse: 75 97 80 76  Resp:   16   Temp: 98.1 F (36.7 C)     TempSrc: Oral     SpO2: 99% 96% 98% 99%  Weight:      Height:        Intake/Output Summary (Last 24 hours) at 10/29/16 1510 Last data filed at 10/29/16 1220  Gross per 24 hour  Intake  1200 ml  Output             1675 ml  Net             -475 ml   Filed Weights   10/26/16 0215 10/27/16 0400 10/29/16 0500  Weight: 87.1 kg (192 lb 0.3 oz) 86.2 kg (190 lb 0.6 oz) 87.1 kg (192 lb 0.3 oz)    Examination:  General exam:Appears calm and comfortable  Respiratory system: bibasilar crackles, scattered rhonchi bilaterally Cardiovascular system:S1 &S2 heard, RRR. No JVD, murmurs, rubs, gallops or clicks. No pedal edema. Gastrointestinal system:Abdomen is nondistended, soft and nontender. No organomegaly or masses felt. Normal bowel sounds heard. Central nervous system:Alert and oriented. No focal neurological  deficits. Extremities: Symmetric 5 x 5 power. Skin: No rashes, lesions or ulcers Psychiatry:Judgement and insight appear normal. Mood &affect appropriate    Data Reviewed: I have personally reviewed following labs and imaging studies  CBC:  Recent Labs Lab 10/25/16 1026 10/25/16 2104 10/26/16 0454  10/27/16 0040 10/28/16 0427 10/28/16 1455 10/29/16 0454 10/29/16 1142  WBC 10.2 9.5 8.8  --   --  9.8  --  8.4  --   NEUTROABS 8,058* 7.9*  --   --   --   --   --   --   --   HGB 4.4* 3.8* 6.3*  < > 8.8* 8.4* 8.4* 8.4* 8.9*  HCT 14.6* 12.6* 19.9*  < > 26.6* 26.1* 26.0* 26.5* 27.7*  MCV 91.3 92.0 88.1  --   --  89.7  --  90.1  --   PLT 439* 316 342  --   --  321  --  330  --   < > = values in this interval not displayed. Basic Metabolic Panel:  Recent Labs Lab 10/25/16 1026 10/25/16 2104 10/26/16 0454 10/27/16 0821 10/29/16 0454  NA 140 138 139 139 138  K 5.0 5.1 4.4 4.4 4.6  CL 106 106 106 105 104  CO2 21 20* 23 26 27   GLUCOSE 232* 229* 164* 209* 160*  BUN 57* 60* 58* 45* 46*  CREATININE 2.58* 2.76* 2.73* 2.67* 2.25*  CALCIUM 8.4* 7.9* 7.9* 8.5* 8.2*   GFR: Estimated Creatinine Clearance: 22.7 mL/min (A) (by C-G formula based on SCr of 2.25 mg/dL (H)). Liver Function Tests:  Recent Labs Lab 10/25/16 1026 10/25/16 2104 10/26/16 0454  AST 23 36 38  ALT 18 21 23   ALKPHOS 49 41 40  BILITOT 0.3 0.5 0.7  PROT 5.8* 5.7* 5.8*  ALBUMIN 3.3* 2.8* 2.9*   No results for input(s): LIPASE, AMYLASE in the last 168 hours. No results for input(s): AMMONIA in the last 168 hours. Coagulation Profile: No results for input(s): INR, PROTIME in the last 168 hours. Cardiac Enzymes:  Recent Labs Lab 10/25/16 2104 10/26/16 0454 10/26/16 0932 10/26/16 1846  TROPONINI 0.27* 1.60* 1.80* 1.36*   BNP (last 3 results) No results for input(s): PROBNP in the last 8760 hours. HbA1C: No results for input(s): HGBA1C in the last 72 hours. CBG:  Recent Labs Lab 10/28/16 1103  10/28/16 1610 10/28/16 2111 10/29/16 0743 10/29/16 1112  GLUCAP 225* 187* 178* 175* 293*   Lipid Profile: No results for input(s): CHOL, HDL, LDLCALC, TRIG, CHOLHDL, LDLDIRECT in the last 72 hours. Thyroid Function Tests: No results for input(s): TSH, T4TOTAL, FREET4, T3FREE, THYROIDAB in the last 72 hours. Anemia Panel: No results for input(s): VITAMINB12, FOLATE, FERRITIN, TIBC, IRON, RETICCTPCT in the last 72 hours. Sepsis Labs:  Recent Labs Lab  10/25/16 2104  LATICACIDVEN 3.6*    Recent Results (from the past 240 hour(s))  Urine culture     Status: None   Collection Time: 10/25/16  8:45 PM  Result Value Ref Range Status   Specimen Description URINE, RANDOM  Final   Special Requests NONE  Final   Culture   Final    NO GROWTH Performed at [facility], [address], [place], [place] [address]    Report Status 10/27/2016 FINAL  Final  Culture, blood (routine x 2)     Status: None (Preliminary result)   Collection Time: 10/25/16  9:04 PM  Result Value Ref Range Status   Specimen Description LEFT ANTECUBITAL  Final   Special Requests   Final    BOTTLES DRAWN AEROBIC AND ANAEROBIC Blood Culture results may not be optimal due to an inadequate volume of blood received in culture bottles   Culture NO GROWTH 4 DAYS  Final   Report Status PENDING  Incomplete  Culture, blood (routine x 2)     Status: None (Preliminary result)   Collection Time: 10/25/16  9:04 PM  Result Value Ref Range Status   Specimen Description BLOOD LEFT HAND  Final   Special Requests   Final    BOTTLES DRAWN AEROBIC AND ANAEROBIC Blood Culture adequate volume   Culture NO GROWTH 4 DAYS  Final   Report Status PENDING  Incomplete  MRSA PCR Screening     Status: None   Collection Time: 10/26/16  1:56 AM  Result Value Ref Range Status   MRSA by PCR NEGATIVE NEGATIVE Final    Comment:        The GeneXpert MRSA Assay (FDA approved for NASAL specimens only), is one component of a comprehensive  MRSA colonization surveillance program. It is not intended to diagnose MRSA infection nor to guide or monitor treatment for MRSA infections.          Radiology Studies: No results found.      Scheduled Meds: . furosemide  20 mg Oral Daily  . insulin aspart  0-9 Units Subcutaneous TID WC  . levothyroxine  25 mcg Oral QAC breakfast  . mouth rinse  15 mL Mouth Rinse BID  . [START ON 10/30/2016] pantoprazole  40 mg Oral QAC breakfast  . polyethylene glycol  17 g Oral Daily  . senna-docusate  1 tablet Oral BID   Continuous Infusions: . sodium chloride    . sodium chloride       LOS: 3 days    Time spent: 30 minutes    Loretha Stapler, MD [facility] Pager [phone]  If 7PM-7AM, please contact night-coverage [email or web address] Password [email or web address] 10/29/2016, 3:10 PM

## 2016-10-30 ENCOUNTER — Other Ambulatory Visit: Payer: Self-pay

## 2016-10-30 ENCOUNTER — Telehealth: Payer: Self-pay

## 2016-10-30 ENCOUNTER — Encounter (HOSPITAL_COMMUNITY): Payer: Self-pay | Admitting: Gastroenterology

## 2016-10-30 ENCOUNTER — Telehealth: Payer: Self-pay | Admitting: Physician Assistant

## 2016-10-30 ENCOUNTER — Telehealth: Payer: Self-pay | Admitting: Gastroenterology

## 2016-10-30 ENCOUNTER — Encounter: Payer: Self-pay | Admitting: Family Medicine

## 2016-10-30 ENCOUNTER — Encounter: Payer: Self-pay | Admitting: Physician Assistant

## 2016-10-30 ENCOUNTER — Other Ambulatory Visit: Payer: Self-pay | Admitting: Gastroenterology

## 2016-10-30 DIAGNOSIS — D649 Anemia, unspecified: Secondary | ICD-10-CM

## 2016-10-30 LAB — CULTURE, BLOOD (ROUTINE X 2)
CULTURE: NO GROWTH
Culture: NO GROWTH
SPECIAL REQUESTS: ADEQUATE

## 2016-10-30 LAB — GLUCOSE, CAPILLARY
Glucose-Capillary: 163 mg/dL — ABNORMAL HIGH (ref 65–99)
Glucose-Capillary: 138 mg/dL — ABNORMAL HIGH (ref 65–99)

## 2016-10-30 LAB — HEMOGLOBIN AND HEMATOCRIT, BLOOD
HCT: 26.8 % — ABNORMAL LOW (ref 36.0–46.0)
HEMOGLOBIN: 8.4 g/dL — AB (ref 12.0–15.0)

## 2016-10-30 MED ORDER — PANTOPRAZOLE SODIUM 40 MG PO TBEC
40.0000 mg | DELAYED_RELEASE_TABLET | Freq: Two times a day (BID) | ORAL | Status: DC
Start: 2016-10-30 — End: 2016-10-30
  Filled 2016-10-30: qty 1

## 2016-10-30 MED ORDER — PANTOPRAZOLE SODIUM 40 MG PO TBEC: 40.0000 mg | DELAYED_RELEASE_TABLET | Freq: Two times a day (BID) | ORAL | 3 refills | Status: DC

## 2016-10-30 NOTE — Telephone Encounter (Signed)
Lab order done and mailed to the pt.  

## 2016-10-30 NOTE — Progress Notes (Signed)
REVIEWED. PT SEEN AND EXAMINED. 1149: SPOKE WITH DR. Talbert Cage, DX: NORMOCYTIC TRANSFUSION DEPENDENT ANEMIA. WILL SEE PT IN 5-7 DAYS. AGREE DIFFERENTIAL DIAGNOSIS INCLUDES MULTIPLE MYELOMA.   Subjective: No abdominal pain, no N/V. Wants to go home. Discussed capsule results with patient and friend, [name].   Objective: Vital signs in last 24 hours: Temp:  [98.1 F (36.7 C)-99.8 F (37.7 C)] 98.9 F (37.2 C) (06/05 0720) Pulse Rate:  [67-97] 73 (06/05 0720) Resp:  [14-22] 18 (06/05 0720) BP: (97-152)/(45-89) 134/57 (06/05 0600) SpO2:  [85 %-100 %] 90 % (06/05 0720) Weight:  [184 lb 1.4 oz (83.5 kg)-192 lb (87.1 kg)] 184 lb 1.4 oz (83.5 kg) (06/05 0500) Last BM Date: 10/29/16 General:   Alert and oriented, pleasant Head:  Normocephalic and atraumatic. Eyes:  No icterus, sclera clear. Conjuctiva pink.  Abdomen:  Bowel sounds present, soft, non-tender, non-distended.  Neurologic:  Alert and  oriented x4 Psych:  Alert and cooperative. Normal mood and affect.  Intake/Output from previous day: 06/04 0701 - 06/05 0700 In: 1065 [P.O.:840; I.V.:225] Out: 500 [Urine:500] Intake/Output this shift: No intake/output data recorded.  Lab Results:  Recent Labs  10/28/16 0427  10/29/16 0454 10/29/16 1142 10/29/16 1621 10/30/16 0429  WBC 9.8  --  8.4  --   --   --   HGB 8.4*  < > 8.4* 8.9* 9.0* 8.4*  HCT 26.1*  < > 26.5* 27.7* 28.2* 26.8*  PLT 321  --  330  --   --   --   < > = values in this interval not displayed. BMET  Recent Labs  10/27/16 0821 10/29/16 0454  NA 139 138  K 4.4 4.6  CL 105 104  CO2 26 27  GLUCOSE 209* 160*  BUN 45* 46*  CREATININE 2.67* 2.25*  CALCIUM 8.5* 8.2*     Assessment: 76 year old female with multiple medical issues, presenting with profound anemia with Hgb 3.8 and just recently evaluated with EGD, colonoscopy, and capsule in [place] mid May 2018 by [facility] GI due to symptomatic anemia without revealing source. Recent NSTEMI April 2018. Hemoccult  positive yesterday. Repeat capsule study yesterday evening, read by Dr. Oneida Alar and full report to follow; no obvious lesion, no blood noted.   Hold off on resuming oral iron and will arrange hematology outpatient. Needs BID PPI. Will discuss Plavix and aspirin with Dr. Oneida Alar. Patient appropriate for discharge home with close follow-up as outpatient.   Plan: Resume heart healthy diet No oral iron  Outpatient hematology visit to be arranged  BID PPI Repeat CBC in 1 week as outpatient, which we will arrange Follow-up with GI June 19th at [place], PhD, ANP-BC [facility] Gastroenter[facility]     LOS: 4 days    10/30/2016, 8:05 AM

## 2016-10-30 NOTE — Telephone Encounter (Signed)
Received following message to my staff inbox:  Bernita Raisin, RN  Charlie Pitter, PA-C        Dr. Idolina Primer:   I am [name], [name]'s Friend and caretaker. I have sent the following message to several doctors at [facility], [place], where [name] is hospitalized. I am concerned for her health and well-being. I do not believe she is receiving proper care.   This is [name], [name]'s friend...   I do not believe that [facility] is giving [name] the level of care she needs and deserves. Communication from the doctors is very slim to none.   I am concerned for [name]'s health, she seems to be deteriorating and I Do not like it. Last night she was filling up with fluid and I had to cause a small ruckus in order for them to issue Lasix to rid your body of such fluids. I do not think that it is my responsibility to take such action.   There seems to be no plan of medical treatment to correct her many medical issues.   Could you please intercede and make things easier for her as I care very much for her well-being.   Thanking you in advance, [name]     It appear that this was sent in as a My Chart message. These are intercepted by nursing staff before ever reaching a provider. This never made it to me so I have no way of replying in the system. Can you please call Mr. Estell Harpin and relay that I am sorry I didn't get back to him as the message typically goes through a triage process and not directly to the provider? She was placed on my schedule as a new patient the day I was filling in in [place], so I am unlikely to see her back in follow-up so I strongly recommend close follow-up with one of our regular providers there to establish care in the next several days for continuity (as soon as she is discharged). I see she is presently admitted to [facility] so please relay that if he has concerns about her cardiac care, to please relay them to the team taking care of her to  determine if cardiology evaluation is needed. The internal medicine team is generally extremely receptive to this. They can speak with the on-call cardiology providers covering care there and in fact welcome family/support members to assist in the ongoing care process.  Thanks! Takeysha Bonk PA-C

## 2016-10-30 NOTE — Telephone Encounter (Signed)
Please arrange outpatient hematology appt due to history of profound transfusion dependent anemia, full work-up to include colonoscopy/EGD, and capsule X 2.

## 2016-10-30 NOTE — Progress Notes (Addendum)
[facility] PROGRESS NOTE    Progress Note  [name]  NTZ:[id number] DOB: [birth year] DOA: 10/25/2016 PCP: [name], MD     Brief Narrative:   [name] is an 76 y.o. female With history of NSTEMI on ASA and plavix, anemia status post EGD, colonoscopy and capsule endoscopy done on 10/08/2016 which showed gastritis, was brought to hospital after patient was found unresponsive at home as per patient's husband. Patient has been feeling fatigued over past several days also complains of black stools but patient takes iron tablets.In the ED patient was found to have severe anemia with hemoglobin 3.8, s/p 4 units  Assessment/Plan:   GI bleed: Pt had EGD and colonoscopy about 2 weeks ago, which showed gastritis. Cont IV PPI, GI consulted rec oral iron and follow up with hematology as an outpatient. Recommended to continue to hold aspirin and Plavix and follow-up with them as an outpatient.  Normocytic Anemia: Admitting Hbg 3.8 s/p 4 units. Cont to monitor Hbg.    DVT prophylaxis: SCD Family Communication:husband Disposition Plan/Barrier to D/C: home today Code Status:     Code Status Orders        Start     Ordered   10/26/16 0211  Full code  Continuous     10/26/16 0210    Code Status History    Date Active Date Inactive Code Status Order ID Comments User Context   10/04/2016  9:19 PM 10/09/2016  9:26 PM Full Code [id number]  Karmen Bongo, MD Inpatient   02/07/2015  6:31 PM 02/10/2015  7:49 PM Full Code [id number]  Lanae Crumbly, PA-C Inpatient   02/05/2015  3:40 PM 02/07/2015  6:31 PM Full Code [id number]  Debbe Odea, MD ED        IV Access:    Peripheral IV   Procedures and diagnostic studies:   No results found.   Medical Consultants:    None.  Anti-Infectives:   None  Subjective:    [name] No complains  Objective:    Vitals:   10/30/16 0400 10/30/16 0500 10/30/16 0600 10/30/16 0720  BP: (!) 118/45 (!)  133/52 (!) 134/57   Pulse: 67 78 72 73  Resp: 17 17 (!) 22 18  Temp: 98.8 F (37.1 C)   98.9 F (37.2 C)  TempSrc: Oral   Oral  SpO2: (!) 89% 90% (!) 85% 90%  Weight:  83.5 kg (184 lb 1.4 oz)    Height:        Intake/Output Summary (Last 24 hours) at 10/30/16 0903 Last data filed at 10/29/16 1800  Gross per 24 hour  Intake              465 ml  Output              250 ml  Net              215 ml   Filed Weights   10/29/16 0500 10/29/16 1929 10/30/16 0500  Weight: 87.1 kg (192 lb 0.3 oz) 87.1 kg (192 lb) 83.5 kg (184 lb 1.4 oz)    Exam: General exam: In no acute distress awake alert and oriented 3 Respiratory system: Good air movement and clear to auscultation. The Cardiovascular system: Regular rate and rhythm with positive S1-S2 Gastrointestinal system: Bowel sounds soft nontender nondistended Central nervous system: Awake alert and oriented 3 nonfocal Extremities: No lower extremity edema Skin: No rashes, lesions or ulcers Psychiatry: Judgement and insight appear normal. Mood &  affect appropriate.    Data Reviewed:    Labs: Basic Metabolic Panel:  Recent Labs Lab 10/25/16 1026 10/25/16 2104 10/26/16 0454 10/27/16 0821 10/29/16 0454  NA 140 138 139 139 138  K 5.0 5.1 4.4 4.4 4.6  CL 106 106 106 105 104  CO2 21 20* 23 26 27   GLUCOSE 232* 229* 164* 209* 160*  BUN 57* 60* 58* 45* 46*  CREATININE 2.58* 2.76* 2.73* 2.67* 2.25*  CALCIUM 8.4* 7.9* 7.9* 8.5* 8.2*   GFR Estimated Creatinine Clearance: 22.2 mL/min (A) (by C-G formula based on SCr of 2.25 mg/dL (H)). Liver Function Tests:  Recent Labs Lab 10/25/16 1026 10/25/16 2104 10/26/16 0454  AST 23 36 38  ALT 18 21 23   ALKPHOS 49 41 40  BILITOT 0.3 0.5 0.7  PROT 5.8* 5.7* 5.8*  ALBUMIN 3.3* 2.8* 2.9*   No results for input(s): LIPASE, AMYLASE in the last 168 hours. No results for input(s): AMMONIA in the last 168 hours. Coagulation profile No results for input(s): INR, PROTIME in the last 168  hours.  CBC:  Recent Labs Lab 10/25/16 1026 10/25/16 2104 10/26/16 0454  10/28/16 0427 10/28/16 1455 10/29/16 0454 10/29/16 1142 10/29/16 1621 10/30/16 0429  WBC 10.2 9.5 8.8  --  9.8  --  8.4  --   --   --   NEUTROABS 8,058* 7.9*  --   --   --   --   --   --   --   --   HGB 4.4* 3.8* 6.3*  < > 8.4* 8.4* 8.4* 8.9* 9.0* 8.4*  HCT 14.6* 12.6* 19.9*  < > 26.1* 26.0* 26.5* 27.7* 28.2* 26.8*  MCV 91.3 92.0 88.1  --  89.7  --  90.1  --   --   --   PLT 439* 316 342  --  321  --  330  --   --   --   < > = values in this interval not displayed. Cardiac Enzymes:  Recent Labs Lab 10/25/16 2104 10/26/16 0454 10/26/16 0932 10/26/16 1846  TROPONINI 0.27* 1.60* 1.80* 1.36*   BNP (last 3 results) No results for input(s): PROBNP in the last 8760 hours. CBG:  Recent Labs Lab 10/29/16 1612 10/29/16 2000 10/29/16 2358 10/30/16 0421 10/30/16 0720  GLUCAP 146* 144* 148* 163* 138*   D-Dimer: No results for input(s): DDIMER in the last 72 hours. Hgb A1c: No results for input(s): HGBA1C in the last 72 hours. Lipid Profile: No results for input(s): CHOL, HDL, LDLCALC, TRIG, CHOLHDL, LDLDIRECT in the last 72 hours. Thyroid function studies: No results for input(s): TSH, T4TOTAL, T3FREE, THYROIDAB in the last 72 hours.  Invalid input(s): FREET3 Anemia work up: No results for input(s): VITAMINB12, FOLATE, FERRITIN, TIBC, IRON, RETICCTPCT in the last 72 hours. Sepsis Labs:  Recent Labs Lab 10/25/16 2104 10/26/16 0454 10/28/16 0427 10/29/16 0454  WBC 9.5 8.8 9.8 8.4  LATICACIDVEN 3.6*  --   --   --    Microbiology Recent Results (from the past 240 hour(s))  Urine culture     Status: None   Collection Time: 10/25/16  8:45 PM  Result Value Ref Range Status   Specimen Description URINE, RANDOM  Final   Special Requests NONE  Final   Culture   Final    NO GROWTH Performed at [facility], [address] [place], [place] [address]    Report Status 10/27/2016 FINAL   Final  Culture, blood (routine x 2)  Status: None   Collection Time: 10/25/16  9:04 PM  Result Value Ref Range Status   Specimen Description LEFT ANTECUBITAL  Final   Special Requests   Final    BOTTLES DRAWN AEROBIC AND ANAEROBIC Blood Culture results may not be optimal due to an inadequate volume of blood received in culture bottles   Culture NO GROWTH 5 DAYS  Final   Report Status 10/30/2016 FINAL  Final  Culture, blood (routine x 2)     Status: None   Collection Time: 10/25/16  9:04 PM  Result Value Ref Range Status   Specimen Description BLOOD LEFT HAND  Final   Special Requests   Final    BOTTLES DRAWN AEROBIC AND ANAEROBIC Blood Culture adequate volume   Culture NO GROWTH 5 DAYS  Final   Report Status 10/30/2016 FINAL  Final  MRSA PCR Screening     Status: None   Collection Time: 10/26/16  1:56 AM  Result Value Ref Range Status   MRSA by PCR NEGATIVE NEGATIVE Final    Comment:        The GeneXpert MRSA Assay (FDA approved for NASAL specimens only), is one component of a comprehensive MRSA colonization surveillance program. It is not intended to diagnose MRSA infection nor to guide or monitor treatment for MRSA infections.      Medications:   . furosemide  20 mg Oral Daily  . insulin aspart  0-9 Units Subcutaneous Q4H  . levothyroxine  25 mcg Oral QAC breakfast  . mouth rinse  15 mL Mouth Rinse BID  . pantoprazole  40 mg Oral BID AC  . polyethylene glycol  17 g Oral Daily  . senna-docusate  1 tablet Oral BID   Continuous Infusions: . sodium chloride    . sodium chloride    . dextrose 5 % and 0.9% NaCl 75 mL/hr at 10/30/16 0811    Time spent:    LOS: 4 days   Charlynne Cousins  [facility] Pager [phone]  *Please refer to [email or web address], password [email or web address] to get updated schedule on who will round on this patient, as hospitalists switch teams weekly. If 7PM-7AM, please contact night-coverage at [email or web address], password [email or web address] for any overnight  needs.  10/30/2016, 9:03 AM

## 2016-10-30 NOTE — Telephone Encounter (Signed)
[facility], they do have our rxs for a w/c and walker from last week. The will call pt to make arrangements.

## 2016-10-30 NOTE — Telephone Encounter (Signed)
Referral has been made.

## 2016-10-30 NOTE — Care Management Note (Signed)
Case Management Note  Patient Details  Name: [name] MRN: [id number] Date of Birth: [birth year]     Expected Discharge Date:                  Expected Discharge Plan:  Home/Self Care  In-House Referral:     Discharge planning Services  CM Consult  Post Acute Care Choice:  Durable Medical Equipment Choice offered to:  Patient  DME Arranged:  Wheelchair manual DME Agency:  [place]:    [facility] Agency:     Status of Service:  In process, will continue to follow  If discussed at Long Length of Stay Meetings, dates discussed:  10/30/2016  Additional Comments:  Beyounce Dickens, Chauncey Reading, RN 10/30/2016, 10:20 AM

## 2016-10-31 ENCOUNTER — Telehealth: Payer: Self-pay | Admitting: Gastroenterology

## 2016-10-31 NOTE — Procedures (Signed)
HPI: CHRONIC ANEMIA SINCE 2016(Cr 1.35). ADMITTED TO [facility] WITH NSTEMI APR 2018. APR 2018: FERRITIN/B12 NORMAL. WENT TO ED [facility] ADMITTED WITH Hb 3.8(CR 3.28-->2.61). TRANSFUSED AND EGD/TCS/GIVENS DID NT REVEAL SOURCE FOR ANEMIA. ADMITTED TO [facility] WITH MS CHANGES AND Hb DROPPED TO 4.4 OVER A 2 WEEK PERIOD(Cr 2.58-->2.25). PT ON IRON C/O CONSTIPATION. NO BLACK TARRY STOOLS OR BRBPR.    PATIENT DATA: WEIGHT: 192 LBS   GASTRIC PASSAGE TIME: 31 m, SB PASSAGE TIME: [place] 52m RESULTS: LIMITED views of gastric mucosa due to retained contents. OCCASIONAL EROSION IN STOMACH.  No ULCERS, masses or AVMs seen N SMALL BOWEL.  LIMITED VIEWS OF THE COLON DUE TO RETAINED CONTENTS. No old blood or fresh blood in the stomach, small bowel, or colon.  DIAGNOSIS: NORMAL GIVENS STUDY   Plan: 1. 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 OPV IN 2 MOS

## 2016-10-31 NOTE — Telephone Encounter (Signed)
Discussed with Dr. Oneida Alar. Resume Plavix today. Hold aspirin for now. Will be seeing cardiology Monday. Spoke with [name], her caretaker. Stated understanding.

## 2016-11-05 ENCOUNTER — Ambulatory Visit (INDEPENDENT_AMBULATORY_CARE_PROVIDER_SITE_OTHER): Payer: Medicare PPO | Admitting: Cardiology

## 2016-11-05 ENCOUNTER — Encounter: Payer: Self-pay | Admitting: Cardiology

## 2016-11-05 VITALS — BP 122/64 | HR 81 | Ht 64.0 in | Wt 183.0 lb

## 2016-11-05 DIAGNOSIS — I214 Non-ST elevation (NSTEMI) myocardial infarction: Secondary | ICD-10-CM

## 2016-11-05 NOTE — Patient Instructions (Signed)

## 2016-11-05 NOTE — Progress Notes (Signed)
Clinical Summary Ms. Maclaren is a 76 y.o.female seen today for follow up of the following medical problems.   1. NSTEMI - 2 admissions over the last few months with elevated troponins in setting of severe anemia - her initial NSTEMI was treated medically including with DAPT, primarily due to advanced renal dysfunction and anemia - readmitted 10/2016 with Hgb of 4.4 , elevated troponin again. DAPT stopped. Peak trop 1.8. 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 Her renal function and recurrent anemia has prohibited ischemic testing. She has statin allergy, no ACEI/ARB due to poor renal function  - weight down 5 lbs since 10/25/16 visit - no recent chest pain, no SOB. Occasional LE edema.  - 08/2016 echo at [facility]: EF 50-55% with inferior wall HK, impaired LV filling, fair study.    2. Anemia/GI bleed - followed by GI - received 4 units of blood during recent admission.  - she is awaiting hematology consult   Past Medical History:  Diagnosis Date  . Anemia   . Arthritis   . Blood transfusion without reported diagnosis   . Cataract   . Chronic anemia   . Chronic combined systolic and diastolic CHF (congestive heart failure) ([place])    a. 2D echo 08/2016 at [facility]: EF 50-55% with inferior wall HK, impaired LV filling, fair study.  . CKD (chronic kidney disease), stage III   . Diabetes ([place])   . Gastritis   . GERD (gastroesophageal reflux disease)   . Gout   . HTN (hypertension)   . Hyperlipidemia   . NSTEMI (non-ST elevated myocardial infarction) ([place])    a. Complex admission 08/2016 - with severe hyperglycemia, AKI on CKD, severe anemia down to Hgb 6.8, acute combined CHF, troponin of 8.5, cath deferred due to renal dysfunction.  . Thyroid disease      Allergies  Allergen Reactions  . Insulin Glargine Swelling    "Makes me swell like a balloon all over", including face, but  without any respiratory distress or rashes. Associated with weight gain.  . Statins Other (See Comments)    "I've tried them all; my muscle aches were so bad I couldn't walk".     Current Outpatient Prescriptions  Medication Sig Dispense Refill  . amLODipine (NORVASC) 10 MG tablet Take 10 mg by mouth daily.    . ferrous sulfate 325 (65 FE) MG tablet Take 325 mg by mouth daily with breakfast.    . furosemide (LASIX) 20 MG tablet Take 20 mg by mouth as needed. Only has to take it when she gains 2 lbs    . gabapentin (NEURONTIN) 300 MG capsule Take 1 capsule by mouth 2 (two) times daily.     Marland Kitchen glipiZIDE (GLUCOTROL) 10 MG tablet Take 1 tablet by mouth 2 (two) times daily.    Marland Kitchen levothyroxine (SYNTHROID, LEVOTHROID) 25 MCG tablet Take 25 mcg by mouth daily before breakfast.    . metoprolol tartrate (LOPRESSOR) 25 MG tablet Take 12.5 mg by mouth 2 (two) times daily.    . Multiple Vitamin (MULTIVITAMIN WITH MINERALS) TABS tablet Take 1 tablet by mouth daily.    . ondansetron (ZOFRAN) 4 MG tablet Take 1 tablet (4 mg total) by mouth every 8 (eight) hours as needed for nausea or vomiting. 20 tablet 1  . pantoprazole (PROTONIX) 40 MG tablet Take 40 mg by mouth daily.    . pantoprazole (PROTONIX)  40 MG tablet Take 1 tablet (40 mg total) by mouth 2 (two) times daily before a meal. 60 tablet 3  . ULORIC 40 MG tablet Take 1 tablet by mouth daily.     No current facility-administered medications for this visit.      Past Surgical History:  Procedure Laterality Date  . COLONOSCOPY WITH PROPOFOL N/A 10/08/2016   Procedure: COLONOSCOPY WITH PROPOFOL;  Surgeon: Ronnette Juniper, MD;  Location: [facility];  Service: Gastroenterology;  Laterality: N/A;  . ESOPHAGOGASTRODUODENOSCOPY N/A 10/06/2016   Procedure: ESOPHAGOGASTRODUODENOSCOPY (EGD);  Surgeon: Otis Brace, MD;  Location: [facility] ENDOSCOPY;  Service: Gastroenterology;  Laterality: N/A;  . GIVENS CAPSULE STUDY  10/08/2016   Procedure: GIVENS CAPSULE  STUDY;  Surgeon: Ronnette Juniper, MD;  Location: [facility];  Service: Gastroenterology;;  . Freda Munro CAPSULE STUDY N/A 10/29/2016   Procedure: GIVENS CAPSULE STUDY;  Surgeon: Danie Binder, MD;  Location[name];  Service: Endoscopy;  Laterality: N/A;  . ORIF HUMERUS FRACTURE Left 02/07/2015   Procedure: OPEN REDUCTION INTERNAL FIXATION (ORIF) PROXIMAL HUMERUS FRACTURE;  Surgeon: Marybelle Killings, MD;  Location: [facility];  Service: Orthopedics;  Laterality: Left;  . THYROID SURGERY    . TOTAL HIP ARTHROPLASTY Left 02/07/2015   Procedure: TOTAL HIP ARTHROPLASTY ANTERIOR APPROACH ;  Surgeon: Marybelle Killings, MD;  Location: [facility];  Service: Orthopedics;  Laterality: Left;  . WRIST SURGERY Left      Allergies  Allergen Reactions  . Insulin Glargine Swelling    "Makes me swell like a balloon all over", including face, but without any respiratory distress or rashes. Associated with weight gain.  . Statins Other (See Comments)    "I've tried them all; my muscle aches were so bad I couldn't walk".      Family History  Problem Relation Age of Onset  . Diabetes Mother   . Asthma Mother   . Early death Mother 53       Pneumonia  . Early death Father        Killed at rodeo  . CAD Neg Hx   . GI Bleed Neg Hx      Social History Ms. Sweis reports that she has never smoked. She has never used smokeless tobacco. Ms. Justus reports that she does not drink alcohol.   Review of Systems CONSTITUTIONAL: No weight loss, fever, chills, weakness or fatigue.  HEENT: Eyes: No visual loss, blurred vision, double vision or yellow sclerae.No hearing loss, sneezing, congestion, runny nose or sore throat.  SKIN: No rash or itching.  CARDIOVASCULAR: per hpi RESPIRATORY: No shortness of breath, cough or sputum.  GASTROINTESTINAL: No anorexia, nausea, vomiting or diarrhea. No abdominal pain or blood.  GENITOURINARY: No burning on urination, no polyuria NEUROLOGICAL: No headache, dizziness, syncope,  paralysis, ataxia, numbness or tingling in the extremities. No change in bowel or bladder control.  MUSCULOSKELETAL: No muscle, back pain, joint pain or stiffness.  LYMPHATICS: No enlarged nodes. No history of splenectomy.  PSYCHIATRIC: No history of depression or anxiety.  ENDOCRINOLOGIC: No reports of sweating, cold or heat intolerance. No polyuria or polydipsia.  Marland Kitchen   Physical Examination Vitals:   11/05/16 1506  BP: 122/64  Pulse: 81   Vitals:   11/05/16 1506  Weight: 183 lb (83 kg)  Height: 5\' 4"  (1.626 m)    Gen: resting comfortably, no acute distress HEENT: no scleral icterus, pupils equal round and reactive, no palptable cervical adenopathy,  CV: RRR, no m/r/g, no jvd Resp: Clear to  auscultation bilaterally GI: abdomen is soft, non-tender, non-distended, normal bowel sounds, no hepatosplenomegaly MSK: extremities are warm, no edema.  Skin: warm, no rash Neuro:  no focal deficits Psych: appropriate affect     Assessment and Plan  1. NSTEMI - significant troponin elevations during 2 prior admissions, both in the setting of severe anemia - suspect supply mismatch due to severe anemia, combined with probable chronic CAD given the degree of troponin elevations, particularly during her first admission. She has not had any significant cardiac symptoms - off DAPT at this time, would not restart until H&H remain stable and no evidence of recurrence. Do not suspect ACS at this time, and thus there is not a strong urgency to restart at this time but will follow clinically when to resume. Likely would restart just a single agent.     F/u 6-8 weeks   Arnoldo Lenis, M.D.

## 2016-11-07 ENCOUNTER — Encounter (HOSPITAL_COMMUNITY): Payer: Medicare PPO | Attending: Oncology | Admitting: Oncology

## 2016-11-07 ENCOUNTER — Other Ambulatory Visit (HOSPITAL_COMMUNITY)
Admission: RE | Admit: 2016-11-07 | Discharge: 2016-11-07 | Disposition: A | Discharge status: Home / Self Care | Payer: Medicare PPO | Source: Ambulatory Visit | Attending: Gastroenterology | Admitting: Gastroenterology

## 2016-11-07 ENCOUNTER — Telehealth: Payer: Self-pay | Admitting: Gastroenterology

## 2016-11-07 ENCOUNTER — Encounter (HOSPITAL_COMMUNITY): Payer: Self-pay | Admitting: Oncology

## 2016-11-07 ENCOUNTER — Encounter (HOSPITAL_COMMUNITY): Payer: Medicare PPO

## 2016-11-07 VITALS — BP 125/41 | HR 76 | Temp 98.0°F | Resp 18 | Ht 64.0 in | Wt 184.2 lb

## 2016-11-07 DIAGNOSIS — D649 Anemia, unspecified: Secondary | ICD-10-CM | POA: Insufficient documentation

## 2016-11-07 LAB — FOLATE: FOLATE: 35.6 ng/mL (ref >5.9)

## 2016-11-07 LAB — SAMPLE TO BLOOD BANK

## 2016-11-07 LAB — CBC WITH DIFFERENTIAL/PLATELET
Basophils Absolute: 0 10*3/uL (ref 0.0–0.1)
Basophils Relative: 1 %
EOS ABS: 0.2 10*3/uL (ref 0.0–0.7)
EOS PCT: 4 %
HCT: 31.1 % — ABNORMAL LOW (ref 36.0–46.0)
Hemoglobin: 9.8 g/dL — ABNORMAL LOW (ref 12.0–15.0)
LYMPHS ABS: 1.1 10*3/uL (ref 0.7–4.0)
Lymphocytes Relative: 17 %
MCH: 28.1 pg (ref 26.0–34.0)
MCHC: 31.5 g/dL (ref 30.0–36.0)
MCV: 89.1 fL (ref 78.0–100.0)
MONO ABS: 0.6 10*3/uL (ref 0.1–1.0)
MONOS PCT: 9 %
Neutro Abs: 4.5 10*3/uL (ref 1.7–7.7)
Neutrophils Relative %: 69 %
PLATELETS: 459 10*3/uL — AB (ref 150–400)
RBC: 3.49 MIL/uL — ABNORMAL LOW (ref 3.87–5.11)
RDW: 14.6 % (ref 11.5–15.5)
WBC: 6.4 10*3/uL (ref 4.0–10.5)

## 2016-11-07 LAB — RETICULOCYTES
RBC.: 3.53 MIL/uL — AB (ref 3.87–5.11)
RETIC COUNT ABSOLUTE: 141.2 10*3/uL (ref 19.0–186.0)
RETIC CT PCT: 4 % — AB (ref 0.4–3.1)

## 2016-11-07 LAB — SEDIMENTATION RATE: Sed Rate: 80 mm/hr — ABNORMAL HIGH (ref 0–22)

## 2016-11-07 LAB — COMPREHENSIVE METABOLIC PANEL
ALT: 20 U/L (ref 14–54)
ANION GAP: 12 (ref 5–15)
AST: 32 U/L (ref 15–41)
Albumin: 3.4 g/dL — ABNORMAL LOW (ref 3.5–5.0)
Alkaline Phosphatase: 52 U/L (ref 38–126)
BUN: 42 mg/dL — ABNORMAL HIGH (ref 6–20)
CHLORIDE: 105 mmol/L (ref 101–111)
CO2: 22 mmol/L (ref 22–32)
Calcium: 9.1 mg/dL (ref 8.9–10.3)
Creatinine, Ser: 2.56 mg/dL — ABNORMAL HIGH (ref 0.44–1.00)
GFR, EST AFRICAN AMERICAN: 20 mL/min — AB (ref >60)
GFR, EST NON AFRICAN AMERICAN: 17 mL/min — AB (ref >60)
Glucose, Bld: 275 mg/dL — ABNORMAL HIGH (ref 65–99)
Potassium: 4.1 mmol/L (ref 3.5–5.1)
SODIUM: 139 mmol/L (ref 135–145)
Total Bilirubin: 0.5 mg/dL (ref 0.3–1.2)
Total Protein: 7.1 g/dL (ref 6.5–8.1)

## 2016-11-07 LAB — IRON AND TIBC
Iron: 58 ug/dL (ref 28–170)
Saturation Ratios: 14 % (ref 10.4–31.8)
TIBC: 405 ug/dL (ref 250–450)
UIBC: 347 ug/dL

## 2016-11-07 LAB — C-REACTIVE PROTEIN: CRP: 1.7 mg/dL — ABNORMAL HIGH (ref <1.0)

## 2016-11-07 LAB — LACTATE DEHYDROGENASE: LDH: 154 U/L (ref 98–192)

## 2016-11-07 LAB — VITAMIN B12: VITAMIN B 12: 562 pg/mL (ref 180–914)

## 2016-11-07 LAB — FERRITIN: Ferritin: 39 ng/mL (ref 11–307)

## 2016-11-07 NOTE — Telephone Encounter (Signed)
Received note from Dr. Harl Bowie. She will remain off of aspirin and plavix right now, with evaluation in 6 weeks by cardiology to discuss restarting depending on clinical course.

## 2016-11-07 NOTE — Assessment & Plan Note (Addendum)
Normocytic, normochromic anemia with preservation of WBC and platelet count in the setting of chronic renal disease and POSITIVE stool cards.  Labs today: CBC diff, CMET, pathology smear review, anemia panel, retic count, haptoglobin, EPO level, ESR, CRP, LDH, sample to blood bank.  Positive stools cards were positive in the recent past.  Will screen for multiple myeloma with: SPEP + IFE, light chain assay.  She likely has multifactorial anemia with iron deficiency and anemia of chronic renal disease.  Will replace vitamin deficiency(s) based upon lab results today - if indicated.  Based upon response, may need to consider ESA therapy.    Return in 2-3 weeks for follow-up and review of lab results.

## 2016-11-07 NOTE — Patient Instructions (Addendum)
[place] at [facility] Discharge Instructions  RECOMMENDATIONS MADE BY THE CONSULTANT AND ANY TEST RESULTS WILL BE SENT TO YOUR REFERRING PHYSICIAN.  You saw Kirby Crigler, PA-C, today. Labs today. Follow up in 2-3 weeks.   Thank you for choosing [place] at [facility] to provide your oncology and hematology care.  To afford each patient quality time with our provider, please arrive at least 15 minutes before your scheduled appointment time.    If you have a lab appointment with the [facility] please come in thru the  Main Entrance and check in at the main information desk  You need to re-schedule your appointment should you arrive 10 or more minutes late.  We strive to give you quality time with our providers, and arriving late affects you and other patients whose appointments are after yours.  Also, if you no show three or more times for appointments you may be dismissed from the clinic at the providers discretion.     Again, thank you for choosing [facility].  Our hope is that these requests will decrease the amount of time that you wait before being seen by our physicians.       _____________________________________________________________  Should you have questions after your visit to [facility], please contact our office at [phone] between the hours of 8:30 a.m. and 4:30 p.m.  Voicemails left after 4:30 p.m. will not be returned until the following business day.  For prescription refill requests, have your pharmacy contact our office.       Resources For Cancer Patients and their Caregivers ? American Cancer Society: Can assist with transportation, wigs, general needs, runs Look Good Feel Better.        364-480-4031 ? Cancer Care: Provides financial assistance, online support groups, medication/co-pay assistance.  1-800-813-HOPE 5342497855) ? [facility] Assists  [place] cancer patients and their families through emotional , educational and financial support.  947-383-8012 ? [place] DSS Where to apply for food stamps, Medicaid and utility assistance. 626-315-4539 ? [facility]: Transportation to medical appointments. (343) 604-9565 ? Social Security Administration: May apply for disability if have a Stage IV cancer. 6054586907 720-498-6767 ? [employer], Disability and Transit Services: Assists with nutrition, care and transit needs. [place] Support Programs: @10RELATIVEDAYS @ > Cancer Support Group  2nd Tuesday of the month 1pm-2pm, Journey Room  > Creative Journey  3rd Tuesday of the month 1130am-1pm, Journey Room  > Look Good Feel Better  1st Wednesday of the month 10am-12 noon, Journey Room (Call [place] to register [phone])

## 2016-11-07 NOTE — Progress Notes (Signed)
[facility] Hematology/Oncology Consultation   Name: [name]      MRN: [id number]    Location: Room/bed info not found  Date: 11/07/2016 Time:2:37 PM   REFERRING PHYSICIAN:  Barney Drain, MD (GI)  REASON FOR CONSULT:  Severe anemia   DIAGNOSIS:  Normocytic, normochromic anemia with WBC and platelet count preservation having required recent hospitalization with transfusion of blood products and GI work-up.  HISTORY OF PRESENT ILLNESS:   [name] is a 76 y.o. female with a medical history significant for chronic renal disease, Stage IV, DM, history of NSTEMI, HTN, hyperlipidemia,  who is referred to the [facility] for normocytic, normochromic anemia with preservation of WBC and platelet count in the setting of chronic renal disease and POSITIVE stool cards.  Patient was admitted to [facility] on 10/25/2016 discharged on 10/29/2016 when she was found at home unresponsive per patient's husband.  Patient reported that she was feeling fatigued over the last several days and complained of black stools.  The patient does take iron tablets.  In the emergency department, the patient was found to have severe anemia with a hemoglobin of 3.8 g/dL. she received 4 units of packed red blood cells.  She also has a history of requiring 4 units of packed red blood cells on 10/04/2016 as well.  During her hospitalization, she was found to have fecal occult positive stool cards on 10/29/2016 2.  She is S/P colonoscopy by Dr. Ronnette Juniper on 10/08/2016 which revealed 1 5 mm polyp in the transverse colon and resection was not attempted due to anticoagulation.  Diverticulosis in sigmoid colon and nonbleeding internal hemorrhoids were noted as well.  Examination was otherwise negative.  She also went a camera capsule study on 10/08/2016 by the same position showing no mucosal abnormality within the small bowel.  She reports a history of melanoma.  She notes that this has  improved and resolved.  She admits to a good energy level and denies any weakness.  She notes her stamina is good as well.  She walks [facility] frequently for exercise.  She interestingly admits to pica having consumed a crushed ice for years.  She reports [name] as the best ice.  She reports an appetite of 100%.  She rates her energy level at 50%.  She denies any pain.  Review of Systems  Constitutional: Negative.  Negative for chills, fever and weight loss.  HENT: Negative.   Eyes: Negative.   Respiratory: Negative.  Negative for cough.   Cardiovascular: Negative.  Negative for chest pain.  Gastrointestinal: Negative.  Negative for blood in stool, constipation, diarrhea, melena, nausea and vomiting.  Genitourinary: Negative.   Musculoskeletal: Negative.   Skin: Negative.   Neurological: Positive for sensory change. Negative for weakness.  Endo/Heme/Allergies: Negative.   Psychiatric/Behavioral: Negative.      PAST MEDICAL HISTORY:   Past Medical History:  Diagnosis Date  . Anemia   . Arthritis   . Blood transfusion without reported diagnosis   . Cataract   . Chronic anemia   . Chronic combined systolic and diastolic CHF (congestive heart failure) ([place])    a. 2D echo 08/2016 at [facility]: EF 50-55% with inferior wall HK, impaired LV filling, fair study.  . CKD (chronic kidney disease), stage III   . Diabetes ([place])   . Gastritis   . GERD (gastroesophageal reflux disease)   . Gout   . HTN (hypertension)   .  Hyperlipidemia   . Normocytic anemia 10/26/2016  . NSTEMI (non-ST elevated myocardial infarction) ([place])    a. Complex admission 08/2016 - with severe hyperglycemia, AKI on CKD, severe anemia down to Hgb 6.8, acute combined CHF, troponin of 8.5, cath deferred due to renal dysfunction.  . Thyroid disease     ALLERGIES: Allergies  Allergen Reactions  . Insulin Glargine Swelling    "Makes me swell like a balloon all over", including face, but without any respiratory distress  or rashes. Associated with weight gain.  . Statins Other (See Comments)    "I've tried them all; my muscle aches were so bad I couldn't walk".      MEDICATIONS: I have reviewed the patient's current medications.    Current Outpatient Prescriptions on File Prior to Visit  Medication Sig Dispense Refill  . amLODipine (NORVASC) 10 MG tablet Take 10 mg by mouth daily.    . ferrous sulfate 325 (65 FE) MG tablet Take 325 mg by mouth daily with breakfast.    . furosemide (LASIX) 20 MG tablet Take 20 mg by mouth as needed. Only has to take it when she gains 2 lbs    . gabapentin (NEURONTIN) 300 MG capsule Take 1 capsule by mouth 2 (two) times daily.     Marland Kitchen glipiZIDE (GLUCOTROL) 10 MG tablet Take 1 tablet by mouth 2 (two) times daily.    Marland Kitchen levothyroxine (SYNTHROID, LEVOTHROID) 25 MCG tablet Take 25 mcg by mouth daily before breakfast.    . metoprolol tartrate (LOPRESSOR) 25 MG tablet Take 12.5 mg by mouth 2 (two) times daily.    . Multiple Vitamin (MULTIVITAMIN WITH MINERALS) TABS tablet Take 1 tablet by mouth daily.    . ondansetron (ZOFRAN) 4 MG tablet Take 1 tablet (4 mg total) by mouth every 8 (eight) hours as needed for nausea or vomiting. 20 tablet 1  . pantoprazole (PROTONIX) 40 MG tablet Take 1 tablet (40 mg total) by mouth 2 (two) times daily before a meal. 60 tablet 3  . ULORIC 40 MG tablet Take 1 tablet by mouth daily.     No current facility-administered medications on file prior to visit.      PAST SURGICAL HISTORY Past Surgical History:  Procedure Laterality Date  . COLONOSCOPY WITH PROPOFOL N/A 10/08/2016   Procedure: COLONOSCOPY WITH PROPOFOL;  Surgeon: Ronnette Juniper, MD;  Location: [place];  Service: Gastroenterology;  Laterality: N/A;  . ESOPHAGOGASTRODUODENOSCOPY N/A 10/06/2016   Procedure: ESOPHAGOGASTRODUODENOSCOPY (EGD);  Surgeon: Otis Brace, MD;  Location: [facility] ENDOSCOPY;  Service: Gastroenterology;  Laterality: N/A;  . GIVENS CAPSULE STUDY  10/08/2016   Procedure:  GIVENS CAPSULE STUDY;  Surgeon: Ronnette Juniper, MD;  Location: [place];  Service: Gastroenterology;;  . Freda Munro CAPSULE STUDY N/A 10/29/2016   Procedure: GIVENS CAPSULE STUDY;  Surgeon: Danie Binder, MD;  [name];  Service: Endoscopy;  Laterality: N/A;  . ORIF HUMERUS FRACTURE Left 02/07/2015   Procedure: OPEN REDUCTION INTERNAL FIXATION (ORIF) PROXIMAL HUMERUS FRACTURE;  Surgeon: Marybelle Killings, MD;  Location: [place];  Service: Orthopedics;  Laterality: Left;  . THYROID SURGERY    . TOTAL HIP ARTHROPLASTY Left 02/07/2015   Procedure: TOTAL HIP ARTHROPLASTY ANTERIOR APPROACH ;  Surgeon: Marybelle Killings, MD;  Location: [place];  Service: Orthopedics;  Laterality: Left;  . WRIST SURGERY Left     FAMILY HISTORY: Family History  Problem Relation Age of Onset  . Diabetes Mother   . Asthma Mother   . Early death Mother  58       Pneumonia  . Early death Father        Killed at rodeo  . CAD Neg Hx   . GI Bleed Neg Hx    Patient reports that her mother and aunt have a history of polycythemia vera. Mother deceased at the age of 23 secondary to pneumonia.  Father deceased but estranged from family after divorcing patient's mother when she was 68 years old.  Son 49 years old healthy. Son is 43 years old healthy, lives in [place]. 6 healthy grandchildren.  SOCIAL HISTORY:  reports that she has never smoked. She has never used smokeless tobacco. She reports that she does not drink alcohol or use drugs.  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  She moved to [place], [place] recently.  She is Christian/Baptist and religion.  She is a widow 34 years when her husband passed away from a myocardial infarction.  Social History   Social History  . Marital status: Soil scientist    Spouse name: [name]  . Number of children: 2  . Years of education: 12   Occupational History  . retired    Social History Main Topics  . Smoking status: Never Smoker  .  Smokeless tobacco: Never Used  . Alcohol use No  . Drug use: No  . Sexual activity: Not Currently   Other Topics Concern  . None   Social History Narrative   Lives with significant other/partner [name]   He is her caregiver   Moved from [facility]    PERFORMANCE STATUS: The patient's performance status is 1 - Symptomatic but completely ambulatory  PHYSICAL EXAM: Most Recent Vital Signs: Blood pressure (!) 125/41, pulse 76, temperature 98 F (36.7 C), temperature source Oral, resp. rate 18, height 5' 4" (1.626 m), weight 184 lb 3.2 oz (83.6 kg), SpO2 99 %. BP (!) 125/41 (BP Location: Left Arm, Patient Position: Sitting)   Pulse 76   Temp 98 F (36.7 C) (Oral)   Resp 18   Ht 5' 4" (1.626 m)   Wt 184 lb 3.2 oz (83.6 kg)   SpO2 99%   BMI 31.62 kg/m   General Appearance:    Alert, cooperative, no distress, appears stated age, accompanied by caregiver/friend.  Head:    Normocephalic, without obvious abnormality, atraumatic  Eyes:    Conjunctiva/corneas clear, EOM's intact, both eyes  Ears:    Not examined  Nose:   Nares normal, septum midline, mucosa normal, no drainage    or sinus tenderness  Throat:   Lips, mucosa, and tongue normal.  Neck:   Supple, symmetrical, trachea midline, no adenopathy  Back:     Symmetric, no curvature, ROM normal, no CVA tenderness  Lungs:     Clear to auscultation bilaterally, respirations unlabored  Chest Wall:    No tenderness or deformity   Heart:    Regular rate and rhythm, S1 and S2 normal, no murmur, rub   or gallop  Breast Exam:    Not examined  Abdomen:     Soft, non-tender, bowel sounds active all four quadrants  Genitalia:    Not examined  Rectal:    Not examined  Extremities:   B/L LE edema, 1+ pitting.  Pulses:   Not examined  Skin:   Skin color, texture, turgor normal, no rashes or lesions  Lymph nodes:   Cervical, supraclavicular, and axillary nodes normal  Neurologic:   CNII-XII intact, normal strength, sensation and  reflexes     throughout    LABORATORY DATA:  CBC    Component Value Date/Time   WBC 6.4 11/07/2016 1035   RBC 3.53 (L) 11/07/2016 1235   RBC 3.49 (L) 11/07/2016 1035   HGB 9.8 (L) 11/07/2016 1035   HCT 31.1 (L) 11/07/2016 1035   PLT 459 (H) 11/07/2016 1035   MCV 89.1 11/07/2016 1035   MCH 28.1 11/07/2016 1035   MCHC 31.5 11/07/2016 1035   RDW 14.6 11/07/2016 1035   LYMPHSABS 1.1 11/07/2016 1035   MONOABS 0.6 11/07/2016 1035   EOSABS 0.2 11/07/2016 1035   BASOSABS 0.0 11/07/2016 1035      Chemistry      Component Value Date/Time   NA 139 11/07/2016 1235   K 4.1 11/07/2016 1235   CL 105 11/07/2016 1235   CO2 22 11/07/2016 1235   BUN 42 (H) 11/07/2016 1235   CREATININE 2.56 (H) 11/07/2016 1235   CREATININE 2.58 (H) 10/25/2016 1026      Component Value Date/Time   CALCIUM 9.1 11/07/2016 1235   ALKPHOS 52 11/07/2016 1235   AST 32 11/07/2016 1235   ALT 20 11/07/2016 1235   BILITOT 0.5 11/07/2016 1235       RADIOGRAPHY: No results found.     PATHOLOGY:  N/A  ASSESSMENT/PLAN:   Normocytic anemia Normocytic, normochromic anemia with preservation of WBC and platelet count in the setting of chronic renal disease and POSITIVE stool cards.  Labs today: CBC diff, CMET, pathology smear review, anemia panel, retic count, haptoglobin, EPO level, ESR, CRP, LDH, sample to blood bank.  Positive stools cards were positive in the recent past.  Will screen for multiple myeloma with: SPEP + IFE, light chain assay.  She likely has multifactorial anemia with iron deficiency and anemia of chronic renal disease.  Will replace vitamin deficiency(s) based upon lab results today - if indicated.  Based upon response, may need to consider ESA therapy.    Return in 2-3 weeks for follow-up and review of lab results.   ORDERS PLACED FOR THIS ENCOUNTER: Orders Placed This Encounter  Procedures  . CBC with Differential  . Comprehensive metabolic panel  . Lactate dehydrogenase  . Sedimentation  rate  . Pathologist smear review  . Vitamin B12  . Folate  . Iron and TIBC  . Ferritin  . Erythropoietin  . Haptoglobin  . Reticulocytes  . Kappa/lambda light chains  . IgG, IgA, IgM  . Immunofixation electrophoresis  . Protein electrophoresis, serum  . C-reactive protein  . Sample to Blood Bank    MEDICATIONS PRESCRIBED THIS ENCOUNTER: No orders of the defined types were placed in this encounter.   All questions were answered. The patient knows to call the clinic with any problems, questions or concerns. We can certainly see the patient much sooner if necessary.  Patient discussed with Dr. Forest Gleason and together we ascertained an up-to-date interval history, and examined the patient.  Dr. Forest Gleason developed the patient's assessment and plan.  This was a shared visit-consultation.  His attestation will follow below.  This note is electronically signed by: Doy Mince 11/07/2016 2:37 PM

## 2016-11-07 NOTE — Progress Notes (Signed)
Hgb stable/improved. I have sent a note in mychart.

## 2016-11-08 ENCOUNTER — Encounter (HOSPITAL_COMMUNITY): Payer: Self-pay | Admitting: Oncology

## 2016-11-08 ENCOUNTER — Other Ambulatory Visit (HOSPITAL_COMMUNITY): Payer: Self-pay | Admitting: Oncology

## 2016-11-08 DIAGNOSIS — D500 Iron deficiency anemia secondary to blood loss (chronic): Secondary | ICD-10-CM

## 2016-11-08 DIAGNOSIS — D509 Iron deficiency anemia, unspecified: Secondary | ICD-10-CM

## 2016-11-08 HISTORY — DX: Iron deficiency anemia, unspecified: D50.9

## 2016-11-08 LAB — IMMUNOFIXATION ELECTROPHORESIS
IGM, SERUM: 65 mg/dL (ref 26–217)
IgA: 159 mg/dL (ref 64–422)
IgG (Immunoglobin G), Serum: 808 mg/dL (ref 700–1600)
Total Protein ELP: 6.5 g/dL (ref 6.0–8.5)

## 2016-11-08 LAB — PROTEIN ELECTROPHORESIS, SERUM
A/G RATIO SPE: 0.9 (ref 0.7–1.7)
ALPHA-1-GLOBULIN: 0.3 g/dL (ref 0.0–0.4)
ALPHA-2-GLOBULIN: 1 g/dL (ref 0.4–1.0)
Albumin ELP: 3 g/dL (ref 2.9–4.4)
Beta Globulin: 1.3 g/dL (ref 0.7–1.3)
GLOBULIN, TOTAL: 3.4 g/dL (ref 2.2–3.9)
Gamma Globulin: 0.8 g/dL (ref 0.4–1.8)
TOTAL PROTEIN ELP: 6.4 g/dL (ref 6.0–8.5)

## 2016-11-08 LAB — KAPPA/LAMBDA LIGHT CHAINS
KAPPA FREE LGHT CHN: 56.4 mg/L — AB (ref 3.3–19.4)
KAPPA, LAMDA LIGHT CHAIN RATIO: 1.56 (ref 0.26–1.65)
LAMDA FREE LIGHT CHAINS: 36.2 mg/L — AB (ref 5.7–26.3)

## 2016-11-08 LAB — IGG, IGA, IGM
IGA: 162 mg/dL (ref 64–422)
IGM, SERUM: 64 mg/dL (ref 26–217)
IgG (Immunoglobin G), Serum: 786 mg/dL (ref 700–1600)

## 2016-11-08 LAB — HAPTOGLOBIN: Haptoglobin: 289 mg/dL — ABNORMAL HIGH (ref 34–200)

## 2016-11-08 LAB — ERYTHROPOIETIN: Erythropoietin: 62.4 m[IU]/mL — ABNORMAL HIGH (ref 2.6–18.5)

## 2016-11-12 LAB — PATHOLOGIST SMEAR REVIEW

## 2016-11-13 ENCOUNTER — Ambulatory Visit (INDEPENDENT_AMBULATORY_CARE_PROVIDER_SITE_OTHER): Payer: Medicare PPO | Admitting: Gastroenterology

## 2016-11-13 ENCOUNTER — Encounter: Payer: Self-pay | Admitting: Gastroenterology

## 2016-11-13 ENCOUNTER — Ambulatory Visit: Payer: Medicare PPO | Admitting: Family Medicine

## 2016-11-13 VITALS — BP 129/67 | HR 76 | Temp 97.1°F | Ht 64.0 in | Wt 186.0 lb

## 2016-11-13 DIAGNOSIS — D508 Other iron deficiency anemias: Secondary | ICD-10-CM | POA: Diagnosis not present

## 2016-11-13 NOTE — Progress Notes (Signed)
Referring Provider: [name], MD Primary Care Physician:  [name], MD Primary GI: Dr. Gala Romney  Chief Complaint  Patient presents with  . gastritis    f/u, doing ok    HPI:   [name] is a 76 y.o. female presenting today with a history of profound anemia, with EGD/colonoscopy, and capsule completed in [place] May 2018. Hospitalized again at [facility] in June with acute on chronic anemia and repeat capsule study completed. Plavix and aspirin on hold per cardiology now. Hgb stable, improving. She has seen Hematology and will receive IV Injectafer next week.   Returns today feeling great. No overt GI bleeding. Fatigue much improved. Enjoying time at her house with her friend, [name]. Talking about getting goats. Good appetite. No N/V. No GI complaints. Taking Protonix TID, which she thought she was supposed to be doing.   Past Medical History:  Diagnosis Date  . Anemia   . Arthritis   . Blood transfusion without reported diagnosis   . Cataract   . Chronic anemia   . Chronic combined systolic and diastolic CHF (congestive heart failure) ([place])    a. 2D echo 08/2016 at [facility]: EF 50-55% with inferior wall HK, impaired LV filling, fair study.  . CKD (chronic kidney disease), stage III   . Diabetes ([place])   . Gastritis   . GERD (gastroesophageal reflux disease)   . Gout   . HTN (hypertension)   . Hyperlipidemia   . Iron deficiency anemia 11/08/2016  . Normocytic anemia 10/26/2016  . NSTEMI (non-ST elevated myocardial infarction) ([place])    a. Complex admission 08/2016 - with severe hyperglycemia, AKI on CKD, severe anemia down to Hgb 6.8, acute combined CHF, troponin of 8.5, cath deferred due to renal dysfunction.  . Thyroid disease     Past Surgical History:  Procedure Laterality Date  . COLONOSCOPY WITH PROPOFOL N/A 10/08/2016   Procedure: COLONOSCOPY WITH PROPOFOL;  Surgeon: Ronnette Juniper, MD;  Location: [place];  Service: Gastroenterology;  Laterality:  N/A;  . ESOPHAGOGASTRODUODENOSCOPY N/A 10/06/2016   Procedure: ESOPHAGOGASTRODUODENOSCOPY (EGD);  Surgeon: Otis Brace, MD;  Location: [facility] ENDOSCOPY;  Service: Gastroenterology;  Laterality: N/A;  . GIVENS CAPSULE STUDY  10/08/2016   Procedure: GIVENS CAPSULE STUDY;  Surgeon: Ronnette Juniper, MD;  Location: [place];  Service: Gastroenterology;;  . Freda Munro CAPSULE STUDY N/A 10/29/2016   Procedure: GIVENS CAPSULE STUDY;  Surgeon: Danie Binder, MD;  [name];  Service: Endoscopy;  Laterality: N/A;  . ORIF HUMERUS FRACTURE Left 02/07/2015   Procedure: OPEN REDUCTION INTERNAL FIXATION (ORIF) PROXIMAL HUMERUS FRACTURE;  Surgeon: Marybelle Killings, MD;  Location: [place];  Service: Orthopedics;  Laterality: Left;  . THYROID SURGERY    . TOTAL HIP ARTHROPLASTY Left 02/07/2015   Procedure: TOTAL HIP ARTHROPLASTY ANTERIOR APPROACH ;  Surgeon: Marybelle Killings, MD;  Location: [place];  Service: Orthopedics;  Laterality: Left;  . WRIST SURGERY Left     Current Outpatient Prescriptions  Medication Sig Dispense Refill  . amLODipine (NORVASC) 10 MG tablet Take 10 mg by mouth daily.    . ferrous sulfate 325 (65 FE) MG tablet Take 325 mg by mouth daily with breakfast.    . furosemide (LASIX) 20 MG tablet Take 20 mg by mouth as needed. Only has to take it when she gains 2 lbs    . gabapentin (NEURONTIN) 300 MG capsule Take 2 capsules by mouth at bedtime.     Marland Kitchen glipiZIDE (GLUCOTROL) 10  MG tablet Take 1 tablet by mouth 2 (two) times daily.    Marland Kitchen levothyroxine (SYNTHROID, LEVOTHROID) 25 MCG tablet Take 25 mcg by mouth daily before breakfast.    . metoprolol tartrate (LOPRESSOR) 25 MG tablet Take 12.5 mg by mouth 2 (two) times daily.    . Multiple Vitamin (MULTIVITAMIN WITH MINERALS) TABS tablet Take 1 tablet by mouth daily.    . ondansetron (ZOFRAN) 4 MG tablet Take 1 tablet (4 mg total) by mouth every 8 (eight) hours as needed for nausea or vomiting. 20 tablet 1  . pantoprazole (PROTONIX) 40 MG tablet Take  1 tablet (40 mg total) by mouth 2 (two) times daily before a meal. (Patient taking differently: Take 40 mg by mouth 3 (three) times daily. ) 60 tablet 3  . ULORIC 40 MG tablet Take 1 tablet by mouth daily.     No current facility-administered medications for this visit.     Allergies as of 11/13/2016 - Review Complete 11/13/2016  Allergen Reaction Noted  . Insulin glargine Swelling 09/18/2016  . Statins Other (See Comments) 09/18/2016    Family History  Problem Relation Age of Onset  . Diabetes Mother   . Asthma Mother   . Early death Mother 14       Pneumonia  . Early death Father        Killed at rodeo  . CAD Neg Hx   . GI Bleed Neg Hx     Social History   Social History  . Marital status: Soil scientist    Spouse name: [name]  . Number of children: 2  . Years of education: 12   Occupational History  . retired    Social History Main Topics  . Smoking status: Never Smoker  . Smokeless tobacco: Never Used  . Alcohol use No  . Drug use: No  . Sexual activity: Not Currently   Other Topics Concern  . None   Social History Narrative   Lives with significant other/partner [name]   He is her caregiver   Moved from [facility]    Review of Systems: As mentioned in HPI   Physical Exam: BP 129/67   Pulse 76   Temp 97.1 F (36.2 C) (Oral)   Ht 5\' 4"  (1.626 m)   Wt 186 lb (84.4 kg)   BMI 31.93 kg/m  General:   Alert and oriented. No distress noted. Pleasant and cooperative.  Head:  Normocephalic and atraumatic. Eyes:  Conjuctiva clear without scleral icterus. Mouth:  Oral mucosa pink and moist. Good dentition. No lesions. Abdomen:  +BS, soft, non-tender and non-distended. No rebound or guarding. No HSM or masses noted. Msk:  Symmetrical without gross deformities. Normal posture. Extremities:  Without edema. Neurologic:  Alert and  oriented x4;  grossly normal neurologically. Psych:  Alert and cooperative. Normal mood and affect.  Lab Results  Component Value Date    WBC 6.4 11/07/2016   HGB 9.8 (L) 11/07/2016   HCT 31.1 (L) 11/07/2016   MCV 89.1 11/07/2016   PLT 459 (H) 11/07/2016   Lab Results  Component Value Date   IRON 58 11/07/2016   TIBC 405 11/07/2016   FERRITIN 39 11/07/2016

## 2016-11-13 NOTE — Patient Instructions (Addendum)
We will see you in three months!  Decrease Protonix to twice a day, 30 minutes before breakfast and dinner.  We will see you in 3 months!

## 2016-11-13 NOTE — Assessment & Plan Note (Signed)
76 year old female with prior history of profound anemia requiring transfusion, thorough evaluation to include colonoscopy/EGD and capsule X 2. IV iron scheduled for next week. Clinically, she has improved and Hgb continues to improve, now at 9.8. No overt GI bleeding. Feels great. I have asked her to decrease Protonix from TID to BID dosing. Return in 3 months or sooner if needed.

## 2016-11-14 ENCOUNTER — Encounter: Payer: Self-pay | Admitting: Family Medicine

## 2016-11-14 NOTE — Progress Notes (Signed)
cc'ed to pcp °

## 2016-11-15 ENCOUNTER — Encounter (HOSPITAL_COMMUNITY): Payer: Self-pay

## 2016-11-15 ENCOUNTER — Other Ambulatory Visit (HOSPITAL_COMMUNITY): Payer: Self-pay | Admitting: Nephrology

## 2016-11-15 ENCOUNTER — Encounter (HOSPITAL_BASED_OUTPATIENT_CLINIC_OR_DEPARTMENT_OTHER): Payer: Medicare PPO

## 2016-11-15 VITALS — BP 133/47 | HR 72 | Temp 98.5°F | Resp 18

## 2016-11-15 DIAGNOSIS — D500 Iron deficiency anemia secondary to blood loss (chronic): Secondary | ICD-10-CM

## 2016-11-15 DIAGNOSIS — N183 Chronic kidney disease, stage 3 unspecified: Secondary | ICD-10-CM

## 2016-11-15 DIAGNOSIS — D508 Other iron deficiency anemias: Secondary | ICD-10-CM

## 2016-11-15 MED ORDER — SODIUM CHLORIDE 0.9 % IV SOLN
INTRAVENOUS | Status: DC
  Administered 2016-11-15: 15:00:00 via INTRAVENOUS

## 2016-11-15 MED ORDER — SODIUM CHLORIDE 0.9 % IV SOLN
750.0000 mg | Freq: Once | INTRAVENOUS | Status: AC
  Administered 2016-11-15: 750 mg via INTRAVENOUS
  Filled 2016-11-15: qty 15

## 2016-11-15 NOTE — Progress Notes (Signed)
[name] tolerated Injectafer infusion well without complaints or incident. VSS upon discharge, Pt discharged self ambulatory in satisfactory condition accompanied by family

## 2016-11-15 NOTE — Patient Instructions (Signed)
[facility] Discharge Instructions  RECOMMENDATIONS MADE BY THE CONSULTANT AND ANY TEST RESULTS WILL BE SENT TO YOUR REFERRING PHYSICIAN.  Received Injectafer infusion today. Follow-up as scheduled. Call clinic for any questions or concerns  Thank you for choosing [facility] at [facility] to provide your oncology and hematology care.  To afford each patient quality time with our provider, please arrive at least 15 minutes before your scheduled appointment time.    If you have a lab appointment with the Cancer Center please come in thru the  Main Entrance and check in at the main information desk  You need to re-schedule your appointment should you arrive 10 or more minutes late.  We strive to give you quality time with our providers, and arriving late affects you and other patients whose appointments are after yours.  Also, if you no show three or more times for appointments you may be dismissed from the clinic at the providers discretion.     Again, thank you for choosing [facility].  Our hope is that these requests will decrease the amount of time that you wait before being seen by our physicians.       _____________________________________________________________  Should you have questions after your visit to [facility], please contact our office at [phone] between the hours of 8:30 a.m. and 4:30 p.m.  Voicemails left after 4:30 p.m. will not be returned until the following business day.  For prescription refill requests, have your pharmacy contact our office.       Resources For Cancer Patients and their Caregivers ? American Cancer Society: Can assist with transportation, wigs, general needs, runs Look Good Feel Better.        1-888-227-6333 ? Cancer Care: Provides financial assistance, online support groups, medication/co-pay assistance.  1-800-813-HOPE (4673) ? [facility] Assists [place] cancer patients and their families through emotional , educational and financial support.  336-427-4357 ? [place] DSS Where to apply for food stamps, Medicaid and utility assistance. 336-342-1394 ? [facility]: Transportation to medical appointments. 336-347-2287 ? Social Security Administration: May apply for disability if have a Stage IV cancer. 336-342-7796 1-800-772-1213 ? [place] Aging, Disability and Transit Services: Assists with nutrition, care and transit needs. 336-349-2343  Cancer Center Support Programs: @10RELATIVEDAYS@ > Cancer Support Group  2nd Tuesday of the month 1pm-2pm, Journey Room  > Creative Journey  3rd Tuesday of the month 1130am-1pm, Journey Room  > Look Good Feel Better  1st Wednesday of the month 10am-12 noon, Journey Room (Call American Cancer Society to register 1-800-395-5775)   

## 2016-11-22 ENCOUNTER — Encounter: Payer: Self-pay | Admitting: Family Medicine

## 2016-11-22 ENCOUNTER — Ambulatory Visit (INDEPENDENT_AMBULATORY_CARE_PROVIDER_SITE_OTHER): Payer: Medicare PPO | Admitting: Family Medicine

## 2016-11-22 VITALS — BP 146/60 | HR 84 | Temp 98.2°F | Resp 18 | Ht 64.0 in | Wt 188.0 lb

## 2016-11-22 DIAGNOSIS — N184 Chronic kidney disease, stage 4 (severe): Secondary | ICD-10-CM | POA: Diagnosis not present

## 2016-11-22 DIAGNOSIS — I10 Essential (primary) hypertension: Secondary | ICD-10-CM | POA: Diagnosis not present

## 2016-11-22 DIAGNOSIS — N185 Chronic kidney disease, stage 5: Secondary | ICD-10-CM

## 2016-11-22 DIAGNOSIS — E785 Hyperlipidemia, unspecified: Secondary | ICD-10-CM

## 2016-11-22 DIAGNOSIS — E1169 Type 2 diabetes mellitus with other specified complication: Secondary | ICD-10-CM

## 2016-11-22 DIAGNOSIS — E1122 Type 2 diabetes mellitus with diabetic chronic kidney disease: Secondary | ICD-10-CM | POA: Diagnosis not present

## 2016-11-22 NOTE — Patient Instructions (Addendum)
Increase water to at least one liter a day Need to have labs tomorrow I am adding kidney function to the oncology labs See specialists as scheduled  See me in 3 months Call or e-mail sooner for problems

## 2016-11-22 NOTE — Progress Notes (Signed)
Chief Complaint  Patient presents with  . Follow-up    2 week   Feels much improved Slowly getting energy back Is avoiding salt She mistakenly ate out - had swelling - doubled her lasix and restricted her fluids this week.  I explained to her the delicate balance she is in with her reduced kidney function and that dehydration is to be AVOIDED at all costs.  She is getting labs tomorrow and I will add a BMP. She is seeing oncology to manage the anemia She is seeing GI to manage the GI bleed She is seeing nephrology for her kidney failure She is scheduled to see Dr Dorris Fetch in endocrinology on July 12 for her diabetes.  Wants to know if she can go back on [place].  The short answer is NO.  It is not indicated with her low GFR.  Cannot take metformin.  I want to put her back on insulin but she insists she is allergic. She has an appt with cardiology regarding her diagnosis of heart disease. Overall is new to the area with multiple medical problems, an dis getting oriented to the specialists in this area she needs. Still do not have her old PCP records  Patient Active Problem List   Diagnosis Date Noted  . Iron deficiency anemia 11/08/2016  . Normocytic anemia 10/26/2016  . Elevated troponin   . Fatigue   . Melena   . Aortic atherosclerosis ([place]) 10/25/2016  . Symptomatic anemia 10/04/2016  . Acute kidney injury superimposed on CKD ([place]) 10/04/2016  . History of non-ST elevation myocardial infarction (NSTEMI) 10/04/2016  . Hyperlipidemia 10/04/2016  . Neuropathy 09/20/2016  . Chronic kidney disease, stage IV (severe) ([place]) 08/03/2015  . Hip fracture ([place]) 02/05/2015  . DM (diabetes mellitus) ([place]) 02/05/2015  . HTN (hypertension) 02/05/2015  . Gout 02/05/2015  . Left humeral fracture 02/05/2015  . Hyperlipidemia associated with type 2 diabetes mellitus ([place]) 05/01/2013    Outpatient Encounter Prescriptions as of 11/22/2016  Medication Sig  . amLODipine (NORVASC) 10 MG tablet Take  10 mg by mouth daily.  . ferrous sulfate 325 (65 FE) MG tablet Take 325 mg by mouth daily with breakfast.  . furosemide (LASIX) 20 MG tablet Take 20 mg by mouth as needed. Only has to take it when she gains 2 lbs  . gabapentin (NEURONTIN) 300 MG capsule Take 2 capsules by mouth at bedtime.   Marland Kitchen glipiZIDE (GLUCOTROL) 10 MG tablet Take 1 tablet by mouth 2 (two) times daily.  Marland Kitchen levothyroxine (SYNTHROID, LEVOTHROID) 25 MCG tablet Take 25 mcg by mouth daily before breakfast.  . metoprolol tartrate (LOPRESSOR) 25 MG tablet Take 12.5 mg by mouth 2 (two) times daily.  . Multiple Vitamin (MULTIVITAMIN WITH MINERALS) TABS tablet Take 1 tablet by mouth daily.  . ondansetron (ZOFRAN) 4 MG tablet Take 1 tablet (4 mg total) by mouth every 8 (eight) hours as needed for nausea or vomiting.  . pantoprazole (PROTONIX) 40 MG tablet Take 1 tablet (40 mg total) by mouth 2 (two) times daily before a meal. (Patient taking differently: Take 40 mg by mouth 3 (three) times daily. )  . ULORIC 40 MG tablet Take 1 tablet by mouth daily.   No facility-administered encounter medications on file as of 11/22/2016.     Allergies  Allergen Reactions  . Insulin Glargine Swelling    "Makes me swell like a balloon all over", including face, but without any respiratory distress or rashes. Associated with weight gain.  Marland Kitchen  Statins Other (See Comments)    "I've tried them all; my muscle aches were so bad I couldn't walk".    Review of Systems  Constitutional: Positive for fatigue and unexpected weight change. Negative for activity change and appetite change.       Gain 2 lbs  HENT: Negative for congestion, dental problem, postnasal drip and rhinorrhea.   Eyes: Negative for redness and visual disturbance.  Respiratory: Negative for cough and shortness of breath.   Cardiovascular: Positive for leg swelling. Negative for chest pain and palpitations.  Gastrointestinal: Negative for abdominal pain, blood in stool, constipation and  diarrhea.  Genitourinary: Negative for difficulty urinating and frequency.  Musculoskeletal: Negative for arthralgias and back pain.  Neurological: Positive for weakness. Negative for dizziness and headaches.  Psychiatric/Behavioral: Negative for dysphoric mood and sleep disturbance. The patient is not nervous/anxious.     BP (!) 146/60 (BP Location: Right Arm, Patient Position: Sitting, Cuff Size: Normal)   Pulse 84   Temp 98.2 F (36.8 C) (Temporal)   Resp 18   Ht 5\' 4"  (1.626 m)   Wt 188 lb 0.6 oz (85.3 kg)   SpO2 97%   BMI 32.28 kg/m   Physical Exam  Constitutional: She is oriented to person, place, and time. She appears well-developed and well-nourished. No distress.  Mild fatigue  HENT:  Head: Normocephalic and atraumatic.  Mouth/Throat: Oropharynx is clear and moist.  Eyes: Conjunctivae are normal. Pupils are equal, round, and reactive to light.  Neck: Normal range of motion.  Cardiovascular: Normal rate.   Murmur heard. Pulmonary/Chest: Effort normal and breath sounds normal. She has no rales.  Abdominal: Soft. Bowel sounds are normal.  Musculoskeletal: She exhibits edema.  One +  Lymphadenopathy:    She has no cervical adenopathy.  Neurological: She is alert and oriented to person, place, and time.  Psychiatric: She has a normal mood and affect. Her behavior is normal.    ASSESSMENT/PLAN:  1. Essential hypertension controlled  2. Hyperlipidemia associated with type 2 diabetes mellitus (HCC) Statin intolerant  3. Type 2 diabetes mellitus with stage 5 chronic kidney disease not on chronic dialysis, without long-term current use of insulin (HCC) Insulin allergic. On glipizide  4. Chronic kidney disease, stage IV (severe) ([place]) With recent fluid restriction, need to check.  discussed - BASIC METABOLIC PANEL WITH GFR   Patient Instructions  Increase water to at least one liter a day Need to have labs tomorrow I am adding kidney function to the oncology  labs See specialists as scheduled  See me in 3 months Call or e-mail sooner for problems   [name], MD

## 2016-11-23 ENCOUNTER — Encounter (HOSPITAL_BASED_OUTPATIENT_CLINIC_OR_DEPARTMENT_OTHER): Payer: Medicare PPO | Admitting: Oncology

## 2016-11-23 ENCOUNTER — Encounter (HOSPITAL_COMMUNITY): Payer: Medicare PPO

## 2016-11-23 ENCOUNTER — Encounter (HOSPITAL_COMMUNITY): Payer: Self-pay

## 2016-11-23 VITALS — BP 139/45 | HR 80 | Temp 98.2°F | Resp 18 | Wt 189.0 lb

## 2016-11-23 DIAGNOSIS — N189 Chronic kidney disease, unspecified: Secondary | ICD-10-CM

## 2016-11-23 DIAGNOSIS — D509 Iron deficiency anemia, unspecified: Secondary | ICD-10-CM | POA: Diagnosis not present

## 2016-11-23 DIAGNOSIS — D508 Other iron deficiency anemias: Secondary | ICD-10-CM

## 2016-11-23 DIAGNOSIS — D649 Anemia, unspecified: Secondary | ICD-10-CM | POA: Diagnosis not present

## 2016-11-23 DIAGNOSIS — D631 Anemia in chronic kidney disease: Secondary | ICD-10-CM

## 2016-11-23 LAB — COMPREHENSIVE METABOLIC PANEL
ALBUMIN: 3.5 g/dL (ref 3.5–5.0)
ALK PHOS: 55 U/L (ref 38–126)
ALT: 13 U/L — ABNORMAL LOW (ref 14–54)
AST: 23 U/L (ref 15–41)
Anion gap: 12 (ref 5–15)
BILIRUBIN TOTAL: 0.6 mg/dL (ref 0.3–1.2)
BUN: 49 mg/dL — AB (ref 6–20)
CALCIUM: 8.9 mg/dL (ref 8.9–10.3)
CO2: 25 mmol/L (ref 22–32)
Chloride: 100 mmol/L — ABNORMAL LOW (ref 101–111)
Creatinine, Ser: 2.47 mg/dL — ABNORMAL HIGH (ref 0.44–1.00)
GFR calc Af Amer: 21 mL/min — ABNORMAL LOW (ref >60)
GFR, EST NON AFRICAN AMERICAN: 18 mL/min — AB (ref >60)
GLUCOSE: 275 mg/dL — AB (ref 65–99)
POTASSIUM: 3.7 mmol/L (ref 3.5–5.1)
Sodium: 137 mmol/L (ref 135–145)
TOTAL PROTEIN: 7.1 g/dL (ref 6.5–8.1)

## 2016-11-23 LAB — CBC WITH DIFFERENTIAL/PLATELET
BASOS ABS: 0 10*3/uL (ref 0.0–0.1)
Basophils Relative: 1 %
Eosinophils Absolute: 0.3 10*3/uL (ref 0.0–0.7)
Eosinophils Relative: 4 %
HEMATOCRIT: 31.8 % — AB (ref 36.0–46.0)
HEMOGLOBIN: 10.3 g/dL — AB (ref 12.0–15.0)
LYMPHS PCT: 20 %
Lymphs Abs: 1.3 10*3/uL (ref 0.7–4.0)
MCH: 28.9 pg (ref 26.0–34.0)
MCHC: 32.4 g/dL (ref 30.0–36.0)
MCV: 89.1 fL (ref 78.0–100.0)
MONO ABS: 0.5 10*3/uL (ref 0.1–1.0)
MONOS PCT: 8 %
NEUTROS ABS: 4.6 10*3/uL (ref 1.7–7.7)
NEUTROS PCT: 67 %
Platelets: 306 10*3/uL (ref 150–400)
RBC: 3.57 MIL/uL — ABNORMAL LOW (ref 3.87–5.11)
RDW: 16.2 % — AB (ref 11.5–15.5)
WBC: 6.7 10*3/uL (ref 4.0–10.5)

## 2016-11-23 NOTE — Progress Notes (Signed)
[facility] Follow up:    Briana Everts, MD [phone] [address] [place] [address]   DIAGNOSIS: Normocytic Anemia  HISTORY OF PRESENT ILLNESS:   [name] is a 76 y.o. female with a medical history significant for chronic renal disease, Stage IV, DM, history of NSTEMI, HTN, hyperlipidemia,  who is referred to the [facility] for normocytic, normochromic anemia with preservation of WBC and platelet count in the setting of chronic renal disease and POSITIVE stool cards.  Patient was admitted to [facility] on 10/25/2016 discharged on 10/29/2016 when she was found at home unresponsive per patient's husband.  Patient reported that she was feeling fatigued over the last several days and complained of black stools.  The patient does take iron tablets.  In the emergency department, the patient was found to have severe anemia with a hemoglobin of 3.8 g/dL. she received 4 units of packed red blood cells.  She also has a history of requiring 4 units of packed red blood cells on 10/04/2016 as well.  During her hospitalization, she was found to have fecal occult positive stool cards on 10/29/2016 2.  She is S/P colonoscopy by Dr. Ronnette Juniper on 10/08/2016 which revealed 1 5 mm polyp in the transverse colon and resection was not attempted due to anticoagulation.  Diverticulosis in sigmoid colon and nonbleeding internal hemorrhoids were noted as well.  Examination was otherwise negative.  She also went a camera capsule study on 10/08/2016 by the same position showing no mucosal abnormality within the small bowel.  She reports a history of melanoma.  She notes that this has improved and resolved.  She admits to a good energy level and denies any weakness.  She notes her stamina is good as well.  She walks at [facility] frequently for exercise.  She interestingly admits to pica having consumed a crushed ice for years.  She reports [name] as the best  ice.   INTERVAL HISTORY: [name] 76 y.o. female returns for follow-up for her anemia with her friend. Her anemia workup demonstrated a mild iron deficiency with a ferritin of 39. The rest of her anemia workup including a vitamin B12 level, folate level, SPEP/IFE, LDH were within normal limits. Patient received 1 dose of Injectafer on 11/15/16. She states that she feels an improvement in her energy level. She denies any chest pain, shortness breath, abdominal pain, focal weakness, bleeding.   Patient Active Problem List   Diagnosis Date Noted  . Iron deficiency anemia 11/08/2016  . Normocytic anemia 10/26/2016  . Elevated troponin   . Fatigue   . Melena   . Aortic atherosclerosis ([place]) 10/25/2016  . Symptomatic anemia 10/04/2016  . Acute kidney injury superimposed on CKD ([place]) 10/04/2016  . History of non-ST elevation myocardial infarction (NSTEMI) 10/04/2016  . Hyperlipidemia 10/04/2016  . Neuropathy 09/20/2016  . Chronic kidney disease, stage IV (severe) ([place]) 08/03/2015  . Hip fracture ([place]) 02/05/2015  . DM (diabetes mellitus) ([place]) 02/05/2015  . HTN (hypertension) 02/05/2015  . Gout 02/05/2015  . Left humeral fracture 02/05/2015  . Hyperlipidemia associated with type 2 diabetes mellitus ([place]) 05/01/2013    is allergic to insulin glargine and statins.  MEDICAL HISTORY: Past Medical History:  Diagnosis Date  . Anemia   . Arthritis   . Blood transfusion without reported diagnosis   . Cataract   . Chronic anemia   . Chronic combined systolic and diastolic CHF (congestive heart  failure) ([place])    a. 2D echo 08/2016 at [facility]: EF 50-55% with inferior wall HK, impaired LV filling, fair study.  . CKD (chronic kidney disease), stage III   . Diabetes ([place])   . Gastritis   . GERD (gastroesophageal reflux disease)   . Gout   . HTN (hypertension)   . Hyperlipidemia   . Iron deficiency anemia 11/08/2016  . Normocytic anemia 10/26/2016  . NSTEMI (non-ST elevated myocardial  infarction) ([place])    a. Complex admission 08/2016 - with severe hyperglycemia, AKI on CKD, severe anemia down to Hgb 6.8, acute combined CHF, troponin of 8.5, cath deferred due to renal dysfunction.  . Thyroid disease     SURGICAL HISTORY: Past Surgical History:  Procedure Laterality Date  . COLONOSCOPY WITH PROPOFOL N/A 10/08/2016   Procedure: COLONOSCOPY WITH PROPOFOL;  Surgeon: Ronnette Juniper, MD;  Location: [place];  Service: Gastroenterology;  Laterality: N/A;  . ESOPHAGOGASTRODUODENOSCOPY N/A 10/06/2016   Procedure: ESOPHAGOGASTRODUODENOSCOPY (EGD);  Surgeon: Otis Brace, MD;  Location: [place] [facility];  Service: Gastroenterology;  Laterality: N/A;  . GIVENS CAPSULE STUDY  10/08/2016   Procedure: GIVENS CAPSULE STUDY;  Surgeon: Ronnette Juniper, MD;  Location: [place];  Service: Gastroenterology;;  . Freda Munro CAPSULE STUDY N/A 10/29/2016   Procedure: GIVENS CAPSULE STUDY;  Surgeon: Danie Binder, MD;  [name];  Service: Endoscopy;  Laterality: N/A;  . ORIF HUMERUS FRACTURE Left 02/07/2015   Procedure: OPEN REDUCTION INTERNAL FIXATION (ORIF) PROXIMAL HUMERUS FRACTURE;  Surgeon: Marybelle Killings, MD;  Location: [place];  Service: Orthopedics;  Laterality: Left;  . THYROID SURGERY    . TOTAL HIP ARTHROPLASTY Left 02/07/2015   Procedure: TOTAL HIP ARTHROPLASTY ANTERIOR APPROACH ;  Surgeon: Marybelle Killings, MD;  Location: [place];  Service: Orthopedics;  Laterality: Left;  . WRIST SURGERY Left     SOCIAL HISTORY: Social History   Social History  . Marital status: Soil scientist    Spouse name: [name]  . Number of children: 2  . Years of education: 12   Occupational History  . retired    Social History Main Topics  . Smoking status: Never Smoker  . Smokeless tobacco: Never Used  . Alcohol use No  . Drug use: No  . Sexual activity: Not Currently   Other Topics Concern  . Not on file   Social History Narrative   Lives with significant other/partner [name]   He is her  caregiver   Moved from [facility]    FAMILY HISTORY: Family History  Problem Relation Age of Onset  . Diabetes Mother   . Asthma Mother   . Early death Mother 19       Pneumonia  . Early death Father        Killed at rodeo  . CAD Neg Hx   . GI Bleed Neg Hx     Review of Systems  Constitutional: Negative for appetite change, chills, fatigue and fever.  HENT:   Negative for hearing loss, lump/mass, mouth sores, sore throat and tinnitus.   Eyes: Negative for eye problems and icterus.  Respiratory: Negative for chest tightness, cough, hemoptysis, shortness of breath and wheezing.   Cardiovascular: Negative for chest pain, leg swelling and palpitations.  Gastrointestinal: Negative for abdominal distention, abdominal pain, blood in stool, diarrhea, nausea and vomiting.  Endocrine: Negative.  Negative for hot flashes.  Genitourinary: Negative for difficulty urinating, frequency and hematuria.   Musculoskeletal: Negative for arthralgias and neck pain.  Skin: Negative for itching  and rash.  Neurological: Negative for dizziness, headaches and speech difficulty.  Hematological: Negative for adenopathy. Does not bruise/bleed easily.  Psychiatric/Behavioral: Negative for confusion. The patient is not nervous/anxious.       PHYSICAL EXAMINATION   Vitals:   11/23/16 0910  BP: (!) 139/45  Pulse: 80  Resp: 18  Temp: 98.2 F (36.8 C)    Physical Exam  Constitutional: She is oriented to person, place, and time and well-developed, well-nourished, and in no distress. No distress.  HENT:  Head: Normocephalic and atraumatic.  Mouth/Throat: No oropharyngeal exudate.  Eyes: Conjunctivae are normal. Pupils are equal, round, and reactive to light. No scleral icterus.  Neck: Normal range of motion. Neck supple. No JVD present.  Cardiovascular: Normal rate, regular rhythm and normal heart sounds.  Exam reveals no gallop and no friction rub.   No murmur heard. Pulmonary/Chest: Breath sounds normal.  No respiratory distress. She has no wheezes. She has no rales.  Abdominal: Soft. Bowel sounds are normal. She exhibits no distension. There is no tenderness. There is no guarding.  Musculoskeletal: She exhibits no edema or tenderness.  Lymphadenopathy:    She has no cervical adenopathy.  Neurological: She is alert and oriented to person, place, and time. No cranial nerve deficit.  Skin: Skin is warm and dry. No rash noted. No erythema. No pallor.  Psychiatric: Affect and judgment normal.    LABORATORY DATA:  CBC    Component Value Date/Time   WBC 6.4 11/07/2016 1035   RBC 3.53 (L) 11/07/2016 1235   RBC 3.49 (L) 11/07/2016 1035   HGB 9.8 (L) 11/07/2016 1035   HCT 31.1 (L) 11/07/2016 1035   PLT 459 (H) 11/07/2016 1035   MCV 89.1 11/07/2016 1035   MCH 28.1 11/07/2016 1035   MCHC 31.5 11/07/2016 1035   RDW 14.6 11/07/2016 1035   LYMPHSABS 1.1 11/07/2016 1035   MONOABS 0.6 11/07/2016 1035   EOSABS 0.2 11/07/2016 1035   BASOSABS 0.0 11/07/2016 1035    CMP     Component Value Date/Time   NA 139 11/07/2016 1235   K 4.1 11/07/2016 1235   CL 105 11/07/2016 1235   CO2 22 11/07/2016 1235   GLUCOSE 275 (H) 11/07/2016 1235   BUN 42 (H) 11/07/2016 1235   CREATININE 2.56 (H) 11/07/2016 1235   CREATININE 2.58 (H) 10/25/2016 1026   CALCIUM 9.1 11/07/2016 1235   PROT 7.1 11/07/2016 1235   ALBUMIN 3.4 (L) 11/07/2016 1235   AST 32 11/07/2016 1235   ALT 20 11/07/2016 1235   ALKPHOS 52 11/07/2016 1235   BILITOT 0.5 11/07/2016 1235   GFRNONAA 17 (L) 11/07/2016 1235   GFRNONAA 17 (L) 10/25/2016 1026   GFRAA 20 (L) 11/07/2016 1235   GFRAA 20 (L) 10/25/2016 1026    ASSESSMENT:  Multifactorial anemia from chronic kidney disease and iron deficiency. Received 1 dose of Injectafer on 11/15/16.  I have discussed starting Procrit monthly on the patient for improvement in her anemia and she is agreeable to proceeding. Therefore I'll set her up for next week. Hold Procrit monthly for  hemoglobin greater than 11. Check CBC today. Return to clinic in 3 months for follow-up with labs.     Orders Placed This Encounter  Procedures  . CBC with Differential    Standing Status:   Future    Standing Expiration Date:   11/23/2017  . Comprehensive metabolic panel    Standing Status:   Future    Standing Expiration Date:   11/23/2017  .  Iron and TIBC    Standing Status:   Future    Standing Expiration Date:   11/23/2017  . Ferritin    Standing Status:   Future    Standing Expiration Date:   11/23/2017  . CBC with Differential    Standing Status:   Future    Standing Expiration Date:   11/23/2017  . Comprehensive metabolic panel    Standing Status:   Future    Standing Expiration Date:   11/23/2017    All questions were answered. The patient knows to call the clinic with any problems, questions or concerns. We can certainly see the patient much sooner if necessary. This note was electronically signed. Twana First, MD 11/23/2016

## 2016-11-23 NOTE — Patient Instructions (Signed)
[place] at [facility] Discharge Instructions  RECOMMENDATIONS MADE BY THE CONSULTANT AND ANY TEST RESULTS WILL BE SENT TO YOUR REFERRING PHYSICIAN.  Exam and discussion today with Dr. Talbert Cage. Return as scheduled.   Thank you for choosing [place] at [facility] to provide your oncology and hematology care.  To afford each patient quality time with our provider, please arrive at least 15 minutes before your scheduled appointment time.    If you have a lab appointment with the [facility] please come in thru the  Main Entrance and check in at the main information desk  You need to re-schedule your appointment should you arrive 10 or more minutes late.  We strive to give you quality time with our providers, and arriving late affects you and other patients whose appointments are after yours.  Also, if you no show three or more times for appointments you may be dismissed from the clinic at the providers discretion.     Again, thank you for choosing [facility]  Our hope is that these requests will decrease the amount of time that you wait before being seen by our physicians.       _____________________________________________________________  Should you have questions after your visit to [facility], please contact our office at [phone] between the hours of 8:30 a.m. and 4:30 p.m.  Voicemails left after 4:30 p.m. will not be returned until the following business day.  For prescription refill requests, have your pharmacy contact our office.       Resources For Cancer Patients and their Caregivers ? American Cancer Society: Can assist with transportation, wigs, general needs, runs Look Good Feel Better.        (320) 240-0036 ? Cancer Care: Provides financial assistance, online support groups, medication/co-pay assistance.  1-800-813-HOPE 608-242-7116) ? [facility] Assists [place]  cancer patients and their families through emotional , educational and financial support.  4096357531 ? [place] DSS Where to apply for food stamps, Medicaid and utility assistance. (330)432-0024 ? [facility]: Transportation to medical appointments. 208-735-5896 ? Social Security Administration: May apply for disability if have a Stage IV cancer. 470-394-9472 8788317201 ? [employer], Disability and Transit Services: Assists with nutrition, care and transit needs. [place] Support Programs: @10RELATIVEDAYS @ > Cancer Support Group  2nd Tuesday of the month 1pm-2pm, Journey Room  > Creative Journey  3rd Tuesday of the month 1130am-1pm, Journey Room  > Look Good Feel Better  1st Wednesday of the month 10am-12 noon, Journey Room (Call [place] to register [phone])

## 2016-11-26 DIAGNOSIS — D62 Acute posthemorrhagic anemia: Secondary | ICD-10-CM

## 2016-11-26 NOTE — Discharge Summary (Addendum)
Physician Discharge Summary  [name] EPP:[id number] DOB: December 08, 1940 DOA: 10/25/2016  PCP: [name], MD  Admit date: 10/25/2016 Discharge date: 11/26/2016  Admitted From: home Disposition:  Home  Recommendations for Outpatient Follow-up:  1. Follow up with PCP in 1-2 weeks 2. Please obtain BMP/CBC in one week 3. Please follow up on the following pending results:  Home Health: None Equipment/Devices: None  Discharge Condition:stable CODE STATUS:full Diet recommendation: Heart Healthy  Brief/Interim Summary: 76 y.o. female With history of NSTEMI on ASA and plavix, anemia status post EGD, colonoscopy and capsule endoscopy done on 10/08/2016 which showed gastritis, was brought to hospital after patient was found unresponsive at home as per patient's husband. Patient has been feeling fatigued over past several days also complains of black stools but patient takes iron tablets. Discharge Diagnoses:  Active Problems:   Normocytic anemia   Elevated troponin   Fatigue   Melena   Acute blood loss anemia  GI bleed: Patient had a colonoscopy and EGD about 2 weeks prior to admission we show mild gastritis. He was continue on Protonix, GI was consulted who recommended oral iron and follow-up with hematology as an outpatient. Will hold aspirin and Plavix until he sees his hematologist in outpatient.  Normocytic anemia: Status post 4 units of packed red blood cells her hemoglobin was 3.8 on admission. She will continue oral iron as an outpatient.   Discharge Instructions  Discharge Instructions    Ambulatory referral to Nephrology    Complete by:  As directed    Diet - low sodium heart healthy    Complete by:  As directed    Increase activity slowly    Complete by:  As directed      Allergies as of 10/30/2016      Reactions   Insulin Glargine Swelling   "Makes me swell like a balloon all over", including face, but without any respiratory distress or rashes.  Associated with weight gain.   Statins Other (See Comments)   "I've tried them all; my muscle aches were so bad I couldn't walk".      Medication List    STOP taking these medications   aspirin EC 81 MG tablet   clopidogrel 75 MG tablet Commonly known as:  PLAVIX     TAKE these medications   amLODipine 10 MG tablet Commonly known as:  NORVASC Take 10 mg by mouth daily.   ferrous sulfate 325 (65 FE) MG tablet Take 325 mg by mouth daily with breakfast.   furosemide 20 MG tablet Commonly known as:  LASIX Take 20 mg by mouth as needed. Only has to take it when she gains 2 lbs   gabapentin 300 MG capsule Commonly known as:  NEURONTIN Take 2 capsules by mouth at bedtime.   glipiZIDE 10 MG tablet Commonly known as:  GLUCOTROL Take 1 tablet by mouth 2 (two) times daily.   levothyroxine 25 MCG tablet Commonly known as:  SYNTHROID, LEVOTHROID Take 25 mcg by mouth daily before breakfast.   metoprolol tartrate 25 MG tablet Commonly known as:  LOPRESSOR Take 12.5 mg by mouth 2 (two) times daily.   multivitamin with minerals Tabs tablet Take 1 tablet by mouth daily.   ondansetron 4 MG tablet Commonly known as:  ZOFRAN Take 1 tablet (4 mg total) by mouth every 8 (eight) hours as needed for nausea or vomiting.   pantoprazole 40 MG tablet Commonly known as:  PROTONIX Take 1 tablet (40 mg total) by mouth 2 (  two) times daily before a meal.   ULORIC 40 MG tablet Generic drug:  febuxostat Take 1 tablet by mouth daily.      Follow-up Information    [name], MD. Schedule an appointment as soon as possible for a visit in 1 week(s).   Specialty:  Family Medicine Contact information: [address] [place] [address] [phone]        Arnoldo Lenis, MD. Schedule an appointment as soon as possible for a visit in 2 week(s).   Specialty:  Cardiology Contact information: [address] [place] [address] [phone]        Fran Lowes, MD. Go on 11/13/2016.   Specialty:  Nephrology Why:  11/13/2016 at 10:15 am Contact information: [address] [place] [address] [phone]          Allergies  Allergen Reactions  . Insulin Glargine Swelling    "Makes me swell like a balloon all over", including face, but without any respiratory distress or rashes. Associated with weight gain.  . Statins Other (See Comments)    "I've tried them all; my muscle aches were so bad I couldn't walk".    Consultations:  None   Procedures/Studies: No results found.  Subjective: She relates no new complains.  Discharge Exam: Vitals:   10/30/16 0900 10/30/16 1000  BP: 107/84 (!) 144/60  Pulse: 80 71  Resp: (!) 21 16  Temp:     Vitals:   10/30/16 0720 10/30/16 0800 10/30/16 0900 10/30/16 1000  BP:   107/84 (!) 144/60  Pulse: 73 66 80 71  Resp: 18 16 (!) 21 16  Temp: 98.9 F (37.2 C)     TempSrc: Oral     SpO2: 90% (!) 87% 94% 94%  Weight:      Height:        General: Patient is awake alert and oriented Cardiovascular: Regular rhythm with positive S1-S2 no murmurs rubs gallops. Respiratory: Good air movement and clear to auscultation. Abdominal: Positive bowel sounds soft nontender nondistended Extremities: No edema.    The results of significant diagnostics from this hospitalization (including imaging, microbiology, ancillary and laboratory) are listed below for reference.     Microbiology: No results found for this or any previous visit (from the past 240 hour(s)).   Labs: BNP (last 3 results) No results for input(s): BNP in the last 8760 hours. Basic Metabolic Panel:  Recent Labs Lab 11/23/16 1001  NA 137  K 3.7  CL 100*  CO2 25  GLUCOSE 275*  BUN 49*  CREATININE 2.47*  CALCIUM 8.9   Liver Function Tests:  Recent Labs Lab 11/23/16 1001  AST 23  ALT 13*  ALKPHOS 55  BILITOT 0.6  PROT 7.1  ALBUMIN 3.5   No results for input(s): LIPASE, AMYLASE in the last 168  hours. No results for input(s): AMMONIA in the last 168 hours. CBC:  Recent Labs Lab 11/23/16 1001  WBC 6.7  NEUTROABS 4.6  HGB 10.3*  HCT 31.8*  MCV 89.1  PLT 306   Cardiac Enzymes: No results for input(s): CKTOTAL, CKMB, CKMBINDEX, TROPONINI in the last 168 hours. BNP: Invalid input(s): POCBNP CBG: No results for input(s): GLUCAP in the last 168 hours. D-Dimer No results for input(s): DDIMER in the last 72 hours. Hgb A1c No results for input(s): HGBA1C in the last 72 hours. Lipid Profile No results for input(s): CHOL, HDL, LDLCALC, TRIG, CHOLHDL, LDLDIRECT in the last 72 hours. Thyroid function  studies No results for input(s): TSH, T4TOTAL, T3FREE, THYROIDAB in the last 72 hours.  Invalid input(s): FREET3 Anemia work up No results for input(s): VITAMINB12, FOLATE, FERRITIN, TIBC, IRON, RETICCTPCT in the last 72 hours. Urinalysis    Component Value Date/Time   COLORURINE YELLOW 10/25/2016 2045   APPEARANCEUR HAZY (A) 10/25/2016 2045   LABSPEC 1.013 10/25/2016 2045   PHURINE 5.0 10/25/2016 2045   GLUCOSEU 50 (A) 10/25/2016 2045   HGBUR NEGATIVE 10/25/2016 2045   BILIRUBINUR NEGATIVE 10/25/2016 2045   [place] 10/25/2016 2045   PROTEINUR 30 (A) 10/25/2016 2045   UROBILINOGEN 0.2 02/08/2015 1615   NITRITE NEGATIVE 10/25/2016 2045   LEUKOCYTESUR NEGATIVE 10/25/2016 2045   Sepsis Labs Invalid input(s): PROCALCITONIN,  WBC,  LACTICIDVEN Microbiology No results found for this or any previous visit (from the past 240 hour(s)).   Time coordinating discharge: Over 30 minutes  SIGNED:   Charlynne Cousins, MD  [facility] 11/26/2016, 12:48 PM Pager   If 7PM-7AM, please contact night-coverage [email or web address] Password [email or web address]

## 2016-11-27 ENCOUNTER — Telehealth: Payer: Self-pay | Admitting: *Deleted

## 2016-11-27 NOTE — Telephone Encounter (Signed)
Patient is requesting hyeralavine 25mg  to be refilled to [facility] in [place]. Please advise

## 2016-11-27 NOTE — Telephone Encounter (Signed)
Pt called asking for hydralazine ? 25 mg. She states it was rxd for her in [place]. Aware dr Meda Coffee would be back in office on Thursday, ok with waiting.

## 2016-11-29 ENCOUNTER — Other Ambulatory Visit (HOSPITAL_COMMUNITY): Payer: Self-pay | Admitting: Oncology

## 2016-11-29 MED ORDER — HYDRALAZINE HCL 25 MG PO TABS: 25.0000 mg | ORAL_TABLET | Freq: Three times a day (TID) | ORAL | 0 refills | Status: DC | PRN

## 2016-11-29 NOTE — Telephone Encounter (Signed)
The medication was ordered per Dr Francesca Oman note below. Attempted to call patient to inform, no answer, voice mail box was full, was unable to leave message.

## 2016-11-29 NOTE — Telephone Encounter (Signed)
She may have #30 to take Q 8hours prn

## 2016-11-30 ENCOUNTER — Ambulatory Visit (HOSPITAL_COMMUNITY): Payer: Medicare PPO

## 2016-12-03 ENCOUNTER — Encounter (HOSPITAL_COMMUNITY): Payer: Medicare PPO | Attending: Oncology

## 2016-12-03 ENCOUNTER — Encounter (HOSPITAL_COMMUNITY): Payer: Self-pay

## 2016-12-03 VITALS — BP 125/45 | HR 67 | Temp 97.8°F | Resp 18

## 2016-12-03 DIAGNOSIS — D500 Iron deficiency anemia secondary to blood loss (chronic): Secondary | ICD-10-CM

## 2016-12-03 DIAGNOSIS — D649 Anemia, unspecified: Secondary | ICD-10-CM | POA: Insufficient documentation

## 2016-12-03 DIAGNOSIS — D508 Other iron deficiency anemias: Secondary | ICD-10-CM

## 2016-12-03 DIAGNOSIS — N184 Chronic kidney disease, stage 4 (severe): Secondary | ICD-10-CM

## 2016-12-03 MED ORDER — SODIUM CHLORIDE 0.9 % IV SOLN
750.0000 mg | Freq: Once | INTRAVENOUS | Status: AC
  Administered 2016-12-03: 750 mg via INTRAVENOUS
  Filled 2016-12-03: qty 15

## 2016-12-03 MED ORDER — SODIUM CHLORIDE 0.9 % IV SOLN
INTRAVENOUS | Status: DC
  Administered 2016-12-03: 11:00:00 via INTRAVENOUS

## 2016-12-03 NOTE — Progress Notes (Signed)
Tolerated infusion w/o adverse reaction.  Alert, in no distress.  VSS.  Discharged ambulatory.  

## 2016-12-03 NOTE — Patient Instructions (Signed)
[facility] at [facility] Discharge Instructions  RECOMMENDATIONS MADE BY THE CONSULTANT AND ANY TEST RESULTS WILL BE SENT TO YOUR REFERRING PHYSICIAN.  Received Injectafer infusion today. Follow-up as scheduled. Call clinic for any questions or concerns  Thank you for choosing [facility] at [facility] to provide your oncology and hematology care.  To afford each patient quality time with our provider, please arrive at least 15 minutes before your scheduled appointment time.    If you have a lab appointment with the Cancer Center please come in thru the  Main Entrance and check in at the main information desk  You need to re-schedule your appointment should you arrive 10 or more minutes late.  We strive to give you quality time with our providers, and arriving late affects you and other patients whose appointments are after yours.  Also, if you no show three or more times for appointments you may be dismissed from the clinic at the providers discretion.     Again, thank you for choosing [facility].  Our hope is that these requests will decrease the amount of time that you wait before being seen by our physicians.       _____________________________________________________________  Should you have questions after your visit to [facility], please contact our office at [phone] between the hours of 8:30 a.m. and 4:30 p.m.  Voicemails left after 4:30 p.m. will not be returned until the following business day.  For prescription refill requests, have your pharmacy contact our office.       Resources For Cancer Patients and their Caregivers ? American Cancer Society: Can assist with transportation, wigs, general needs, runs Look Good Feel Better.        1-888-227-6333 ? Cancer Care: Provides financial assistance, online support groups, medication/co-pay assistance.  1-800-813-HOPE (4673) ? [facility] Assists [place] cancer patients and their families through emotional , educational and financial support.  336-427-4357 ? [place] DSS Where to apply for food stamps, Medicaid and utility assistance. 336-342-1394 ? [facility]: Transportation to medical appointments. 336-347-2287 ? Social Security Administration: May apply for disability if have a Stage IV cancer. 336-342-7796 1-800-772-1213 ? [place] Aging, Disability and Transit Services: Assists with nutrition, care and transit needs. 336-349-2343  Cancer Center Support Programs: @[email or web address]@ > Cancer Support Group  2nd Tuesday of the month 1pm-2pm, Journey Room  > Creative Journey  3rd Tuesday of the month 1130am-1pm, Journey Room  > Look Good Feel Better  1st Wednesday of the month 10am-12 noon, Journey Room (Call American Cancer Society to register 1-800-395-5775)   

## 2016-12-05 ENCOUNTER — Ambulatory Visit: Payer: Medicare PPO | Admitting: Gastroenterology

## 2016-12-06 ENCOUNTER — Ambulatory Visit (INDEPENDENT_AMBULATORY_CARE_PROVIDER_SITE_OTHER): Payer: Medicare HMO | Admitting: "Endocrinology

## 2016-12-06 ENCOUNTER — Encounter: Payer: Self-pay | Admitting: "Endocrinology

## 2016-12-06 VITALS — BP 135/70 | HR 76 | Ht 64.0 in | Wt 189.0 lb

## 2016-12-06 DIAGNOSIS — I10 Essential (primary) hypertension: Secondary | ICD-10-CM

## 2016-12-06 DIAGNOSIS — N184 Chronic kidney disease, stage 4 (severe): Secondary | ICD-10-CM | POA: Diagnosis not present

## 2016-12-06 DIAGNOSIS — E1122 Type 2 diabetes mellitus with diabetic chronic kidney disease: Secondary | ICD-10-CM | POA: Diagnosis not present

## 2016-12-06 DIAGNOSIS — E039 Hypothyroidism, unspecified: Secondary | ICD-10-CM

## 2016-12-06 NOTE — Patient Instructions (Signed)

## 2016-12-06 NOTE — Progress Notes (Signed)
Subjective:    Patient ID: [name], female    DOB: 1940/12/17. Patient is being seen in consultation for management of diabetes requested by  Briana Everts, MD  Past Medical History:  Diagnosis Date  . Anemia   . Arthritis   . Blood transfusion without reported diagnosis   . Cataract   . Chronic anemia   . Chronic combined systolic and diastolic CHF (congestive heart failure) ([place])    a. 2D echo 08/2016 at [facility]: EF 50-55% with inferior wall HK, impaired LV filling, fair study.  . CKD (chronic kidney disease), stage III   . Diabetes ([place])   . Gastritis   . GERD (gastroesophageal reflux disease)   . Gout   . HTN (hypertension)   . Hyperlipidemia   . Iron deficiency anemia 11/08/2016  . Normocytic anemia 10/26/2016  . NSTEMI (non-ST elevated myocardial infarction) ([place])    a. Complex admission 08/2016 - with severe hyperglycemia, AKI on CKD, severe anemia down to Hgb 6.8, acute combined CHF, troponin of 8.5, cath deferred due to renal dysfunction.  . Thyroid disease    Past Surgical History:  Procedure Laterality Date  . COLONOSCOPY WITH PROPOFOL N/A 10/08/2016   Procedure: COLONOSCOPY WITH PROPOFOL;  Surgeon: Ronnette Juniper, MD;  Location: [facility];  Service: Gastroenterology;  Laterality: N/A;  . ESOPHAGOGASTRODUODENOSCOPY N/A 10/06/2016   Procedure: ESOPHAGOGASTRODUODENOSCOPY (EGD);  Surgeon: Otis Brace, MD;  Location: [facility] ENDOSCOPY;  Service: Gastroenterology;  Laterality: N/A;  . GIVENS CAPSULE STUDY  10/08/2016   Procedure: GIVENS CAPSULE STUDY;  Surgeon: Ronnette Juniper, MD;  Location: [facility];  Service: Gastroenterology;;  . Freda Munro CAPSULE STUDY N/A 10/29/2016   Procedure: GIVENS CAPSULE STUDY;  Surgeon: Danie Binder, MD;  [name];  Service: Endoscopy;  Laterality: N/A;  . ORIF HUMERUS FRACTURE Left 02/07/2015   Procedure: OPEN REDUCTION INTERNAL FIXATION (ORIF) PROXIMAL HUMERUS FRACTURE;  Surgeon: Marybelle Killings, MD;  Location: [facility];   Service: Orthopedics;  Laterality: Left;  . THYROID SURGERY    . TOTAL HIP ARTHROPLASTY Left 02/07/2015   Procedure: TOTAL HIP ARTHROPLASTY ANTERIOR APPROACH ;  Surgeon: Marybelle Killings, MD;  Location: [facility];  Service: Orthopedics;  Laterality: Left;  . WRIST SURGERY Left    Social History   Social History  . Marital status: Soil scientist    Spouse name: [name]  . Number of children: 2  . Years of education: 12   Occupational History  . retired    Social History Main Topics  . Smoking status: Never Smoker  . Smokeless tobacco: Never Used  . Alcohol use No  . Drug use: No  . Sexual activity: Not Currently   Other Topics Concern  . None   Social History Narrative   Lives with significant other/partner [name]   He is her caregiver   Moved from [facility]   Outpatient Encounter Prescriptions as of 12/06/2016  Medication Sig  . amLODipine (NORVASC) 10 MG tablet Take 10 mg by mouth daily.  . ferrous sulfate 325 (65 FE) MG tablet Take 325 mg by mouth daily with breakfast.  . furosemide (LASIX) 20 MG tablet Take 20 mg by mouth as needed. Only has to take it when she gains 2 lbs  . gabapentin (NEURONTIN) 300 MG capsule Take 2 capsules by mouth at bedtime.   . hydrALAZINE (APRESOLINE) 25 MG tablet Take 1 tablet (25 mg total) by mouth every 8 (eight) hours as needed.  Marland Kitchen levothyroxine (SYNTHROID, LEVOTHROID) 25  MCG tablet Take 25 mcg by mouth daily before breakfast.  . metoprolol tartrate (LOPRESSOR) 25 MG tablet Take 12.5 mg by mouth 2 (two) times daily.  . Multiple Vitamin (MULTIVITAMIN WITH MINERALS) TABS tablet Take 1 tablet by mouth daily.  . ondansetron (ZOFRAN) 4 MG tablet Take 1 tablet (4 mg total) by mouth every 8 (eight) hours as needed for nausea or vomiting.  . pantoprazole (PROTONIX) 40 MG tablet Take 1 tablet (40 mg total) by mouth 2 (two) times daily before a meal. (Patient taking differently: Take 40 mg by mouth daily. )  . ULORIC 40 MG tablet Take 1 tablet by mouth daily.  .  [DISCONTINUED] glipiZIDE (GLUCOTROL) 10 MG tablet Take 1 tablet by mouth 2 (two) times daily.   No facility-administered encounter medications on file as of 12/06/2016.    ALLERGIES: Allergies  Allergen Reactions  . Insulin Glargine Swelling    "Makes me swell like a balloon all over", including face, but without any respiratory distress or rashes. Associated with weight gain.  . Statins Other (See Comments)    "I've tried them all; my muscle aches were so bad I couldn't walk".   VACCINATION STATUS:  There is no immunization history on file for this patient.  Diabetes  She presents for her initial diabetic visit. She has type 2 diabetes mellitus. Onset time: She was diagnosed at approximate age of 72 years. Her disease course has been fluctuating. Hypoglycemia symptoms include confusion, dizziness, mood changes and sweats. Pertinent negatives for hypoglycemia include no headaches, pallor or seizures. There are no diabetic associated symptoms. Pertinent negatives for diabetes include no chest pain, no polydipsia, no polyphagia and no polyuria. There are no hypoglycemic complications. Symptoms are worsening. Diabetic complications include nephropathy. Risk factors for coronary artery disease include diabetes mellitus, hypertension and post-menopausal. Current diabetic treatment includes oral agent (monotherapy) (She is taking glipizide 10 mg by mouth twice a day, her recent A1c is 5.3%. She reports frequent hypoglycemia into the 50s.). Her weight is increasing steadily. She is following a generally unhealthy diet. When asked about meal planning, she reported none. She has not had a previous visit with a dietitian. She never participates in exercise. Her home blood glucose trend is fluctuating dramatically. An ACE inhibitor/angiotensin II receptor blocker is being taken. Eye exam is current.  Hypertension  This is a chronic problem. The current episode started more than 1 year ago. Associated symptoms  include sweats. Pertinent negatives include no chest pain, headaches, palpitations or shortness of breath. Past treatments include calcium channel blockers and direct vasodilators. Hypertensive end-organ damage includes kidney disease.       Review of Systems  Constitutional: Negative for chills, fever and unexpected weight change.  HENT: Negative for trouble swallowing and voice change.   Eyes: Negative for visual disturbance.  Respiratory: Negative for cough, shortness of breath and wheezing.   Cardiovascular: Negative for chest pain, palpitations and leg swelling.  Gastrointestinal: Negative for diarrhea, nausea and vomiting.  Endocrine: Negative for cold intolerance, heat intolerance, polydipsia, polyphagia and polyuria.  Musculoskeletal: Negative for arthralgias and myalgias.  Skin: Negative for color change, pallor, rash and wound.  Neurological: Positive for dizziness. Negative for seizures and headaches.  Psychiatric/Behavioral: Positive for confusion. Negative for suicidal ideas.    Objective:    BP 135/70   Pulse 76   Ht 5\' 4"  (1.626 m)   Wt 189 lb (85.7 kg)   BMI 32.44 kg/m   Wt Readings from Last 3 Encounters:  12/06/16  189 lb (85.7 kg)  11/23/16 189 lb (85.7 kg)  11/22/16 188 lb 0.6 oz (85.3 kg)    Physical Exam  Constitutional: She is oriented to person, place, and time. She appears well-developed.  HENT:  Head: Normocephalic and atraumatic.  Eyes: EOM are normal.  Neck: Normal range of motion. Neck supple. No tracheal deviation present. No thyromegaly present.  Cardiovascular: Normal rate and regular rhythm.   Pulmonary/Chest: Effort normal and breath sounds normal.  Abdominal: Soft. Bowel sounds are normal. There is no tenderness. There is no guarding.  Musculoskeletal: Normal range of motion. She exhibits no edema.  Neurological: She is alert and oriented to person, place, and time. She has normal reflexes. No cranial nerve deficit. Coordination normal.   Skin: Skin is warm and dry. No rash noted. No erythema. No pallor.  Psychiatric: She has a normal mood and affect. Judgment normal.     CMP     Component Value Date/Time   NA 137 11/23/2016 1001   K 3.7 11/23/2016 1001   CL 100 (L) 11/23/2016 1001   CO2 25 11/23/2016 1001   GLUCOSE 275 (H) 11/23/2016 1001   BUN 49 (H) 11/23/2016 1001   CREATININE 2.47 (H) 11/23/2016 1001   CREATININE 2.58 (H) 10/25/2016 1026   CALCIUM 8.9 11/23/2016 1001   PROT 7.1 11/23/2016 1001   ALBUMIN 3.5 11/23/2016 1001   AST 23 11/23/2016 1001   ALT 13 (L) 11/23/2016 1001   ALKPHOS 55 11/23/2016 1001   BILITOT 0.6 11/23/2016 1001   GFRNONAA 18 (L) 11/23/2016 1001   GFRNONAA 17 (L) 10/25/2016 1026   GFRAA 21 (L) 11/23/2016 1001   GFRAA 20 (L) 10/25/2016 1026     Diabetic Labs (most recent): Lab Results  Component Value Date   HGBA1C 5.3 10/25/2016   HGBA1C 6.0 (H) 10/05/2016   HGBA1C 7.6 (H) 02/14/2015     Assessment & Plan:   1. Diabetes mellitus with stage 4 chronic kidney disease ([place])   - Patient has currently uncontrolled symptomatic type 2 DM since 76 years of age. Currently tightly controlled with recent A1c of 5.3 %, on large dose glipizide with frequent hypoglycemia.  Recent labs reviewed, showing stage 4 CKD.   - her diabetes is complicated by CKD and patient remains at a high risk for more acute and chronic complications which include CAD, CVA, ERSD, retinopathy, and neuropathy. These are all discussed in detail with the patient.  - I have counseled the patient on diet management and weight loss, by adopting a carbohydrate restricted/protein rich diet.  - Suggestion is made for patient to avoid simple carbohydrates   from her diet including Cakes , Desserts, Ice Cream,  Soda (  diet and regular) , Sweet Tea , Candies,  Chips, Cookies, Artificial Sweeteners,   and "Sugar-free" Products . This will help patient to have stable blood glucose profile and potentially avoid unintended  weight gain.  - I encouraged the patient to switch to  unprocessed or minimally processed complex starch and increased protein intake (animal or plant source), fruits, and vegetables.  - Patient is advised to stick to a routine mealtimes to eat 3 meals  a day and avoid unnecessary snacks ( to snack only to correct hypoglycemia).  - The patient will be scheduled with [name], [name], [name] for individualized DM education.  - I have approached patient with the following individualized plan to manage diabetes and patient agrees:   - Based on her presentation with frequent hypoglycemia and large  dose sulfonylurea, the first target of treatment in her case would be to avoid hypoglycemia. - I advised her to discontinue glipizide altogether, she will be considered for other safer medications if necessary. - She will start to monitor blood glucose 4 times a day-before meals and at bedtime and return in one week with her meter and logs.  -Patient is encouraged to call clinic for blood glucose levels less than 70 or above 300 mg /dl.  - I will discontinue glipizide , risk outweighs benefit for this patient. -Patient is not a candidate for metformin, SGLT2 inhibitors  due to CKD.  - Patient specific target  A1c;  LDL, HDL, Triglycerides, and  Waist Circumference were discussed in detail.  2) BP/HTN:  controlled. Continue current medications , not on ACEI/ARB. 3) Lipids/HPL:   Control unknown.   Patient is not on  Statins due to adverse reactions. 4)  Weight/Diet: [name] Consult will be initiated , exercise, and detailed carbohydrates information provided.  5) hypothyroidism: I advised her to continue LT4 25 mcg po qam. Will need TFTs on subsequent labs.  6) Chronic Care/Health Maintenance:  -Patient is not  on ACEI/ARB and Statin medications and encouraged to continue to follow up with Ophthalmology, Podiatrist at least yearly or according to recommendations, and advised to  stay away from smoking. I  have recommended yearly flu vaccine and pneumonia vaccination at least every 5 years; moderate intensity exercise for up to 150 minutes weekly; and  sleep for at least 7 hours a day.  - 60 minutes of time was spent on the care of this patient , 50% of which was applied for counseling on diabetes complications and their preventions.  - Patient to bring meter and  blood glucose logs during her next visit.   - I advised patient to maintain close follow up with Briana Coffee Jennette Banker, MD for primary care needs.  Follow up plan: - Return in about 1 week (around 12/13/2016) for follow up with meter and logs- no labs.  Glade [name], MD Phone: [phone]  Fax: [phone]   12/06/2016, 3:58 PM

## 2016-12-10 ENCOUNTER — Ambulatory Visit (HOSPITAL_COMMUNITY): Payer: Medicare PPO

## 2016-12-13 ENCOUNTER — Ambulatory Visit (INDEPENDENT_AMBULATORY_CARE_PROVIDER_SITE_OTHER): Payer: Medicare HMO | Admitting: "Endocrinology

## 2016-12-13 ENCOUNTER — Encounter: Payer: Self-pay | Admitting: "Endocrinology

## 2016-12-13 VITALS — BP 146/66 | HR 80 | Ht 64.0 in | Wt 185.0 lb

## 2016-12-13 DIAGNOSIS — E1122 Type 2 diabetes mellitus with diabetic chronic kidney disease: Secondary | ICD-10-CM

## 2016-12-13 DIAGNOSIS — I10 Essential (primary) hypertension: Secondary | ICD-10-CM | POA: Diagnosis not present

## 2016-12-13 DIAGNOSIS — N184 Chronic kidney disease, stage 4 (severe): Secondary | ICD-10-CM

## 2016-12-13 DIAGNOSIS — E039 Hypothyroidism, unspecified: Secondary | ICD-10-CM | POA: Diagnosis not present

## 2016-12-13 MED ORDER — INSULIN PEN NEEDLE 31G X 8 MM MISC: 1.0000 | 3 refills | Status: DC

## 2016-12-13 MED ORDER — INSULIN GLARGINE 300 UNIT/ML ~~LOC~~ SOPN: 20.0000 [IU] | PEN_INJECTOR | Freq: Every day | SUBCUTANEOUS | 2 refills | Status: DC

## 2016-12-13 NOTE — Patient Instructions (Signed)

## 2016-12-13 NOTE — Progress Notes (Signed)
Subjective:    Patient ID: [name], female    DOB: August 23, 1940. Patient is being seen in f/u for management of diabetes requested by  [name], MD  Past Medical History:  Diagnosis Date  . Anemia   . Arthritis   . Blood transfusion without reported diagnosis   . Cataract   . Chronic anemia   . Chronic combined systolic and diastolic CHF (congestive heart failure) ([place])    a. 2D echo 08/2016 at [facility]: EF 50-55% with inferior wall HK, impaired LV filling, fair study.  . CKD (chronic kidney disease), stage III   . Diabetes ([place])   . Gastritis   . GERD (gastroesophageal reflux disease)   . Gout   . HTN (hypertension)   . Hyperlipidemia   . Iron deficiency anemia 11/08/2016  . Normocytic anemia 10/26/2016  . NSTEMI (non-ST elevated myocardial infarction) ([place])    a. Complex admission 08/2016 - with severe hyperglycemia, AKI on CKD, severe anemia down to Hgb 6.8, acute combined CHF, troponin of 8.5, cath deferred due to renal dysfunction.  . Thyroid disease    Past Surgical History:  Procedure Laterality Date  . COLONOSCOPY WITH PROPOFOL N/A 10/08/2016   Procedure: COLONOSCOPY WITH PROPOFOL;  Surgeon: Ronnette Juniper, MD;  Location: [place];  Service: Gastroenterology;  Laterality: N/A;  . ESOPHAGOGASTRODUODENOSCOPY N/A 10/06/2016   Procedure: ESOPHAGOGASTRODUODENOSCOPY (EGD);  Surgeon: Otis Brace, MD;  Location: [facility] ENDOSCOPY;  Service: Gastroenterology;  Laterality: N/A;  . GIVENS CAPSULE STUDY  10/08/2016   Procedure: GIVENS CAPSULE STUDY;  Surgeon: Ronnette Juniper, MD;  Location: [place];  Service: Gastroenterology;;  . Freda Munro CAPSULE STUDY N/A 10/29/2016   Procedure: GIVENS CAPSULE STUDY;  Surgeon: Danie Binder, MD;  [name];  Service: Endoscopy;  Laterality: N/A;  . ORIF HUMERUS FRACTURE Left 02/07/2015   Procedure: OPEN REDUCTION INTERNAL FIXATION (ORIF) PROXIMAL HUMERUS FRACTURE;  Surgeon: Marybelle Killings, MD;  Location: [place];  Service:  Orthopedics;  Laterality: Left;  . THYROID SURGERY    . TOTAL HIP ARTHROPLASTY Left 02/07/2015   Procedure: TOTAL HIP ARTHROPLASTY ANTERIOR APPROACH ;  Surgeon: Marybelle Killings, MD;  Location: [place];  Service: Orthopedics;  Laterality: Left;  . WRIST SURGERY Left    Social History   Social History  . Marital status: Soil scientist    Spouse name: [name]  . Number of children: 2  . Years of education: 12   Occupational History  . retired    Social History Main Topics  . Smoking status: Never Smoker  . Smokeless tobacco: Never Used  . Alcohol use No  . Drug use: No  . Sexual activity: Not Currently   Other Topics Concern  . None   Social History Narrative   Lives with significant other/partner [name]   He is her caregiver   Moved from [place]   Outpatient Encounter Prescriptions as of 12/13/2016  Medication Sig  . amLODipine (NORVASC) 10 MG tablet Take 10 mg by mouth daily.  . ferrous sulfate 325 (65 FE) MG tablet Take 325 mg by mouth daily with breakfast.  . furosemide (LASIX) 20 MG tablet Take 20 mg by mouth as needed. Only has to take it when she gains 2 lbs  . gabapentin (NEURONTIN) 300 MG capsule Take 2 capsules by mouth at bedtime.   . hydrALAZINE (APRESOLINE) 25 MG tablet Take 1 tablet (25 mg total) by mouth every 8 (eight) hours as needed.  . Insulin Glargine (TOUJEO MAX  SOLOSTAR) 300 UNIT/ML SOPN Inject 20 Units into the skin at bedtime.  . Insulin Pen Needle (B-D ULTRAFINE III SHORT PEN) 31G X 8 MM MISC 1 each by Does not apply route as directed.  Marland Kitchen levothyroxine (SYNTHROID, LEVOTHROID) 25 MCG tablet Take 25 mcg by mouth daily before breakfast.  . metoprolol tartrate (LOPRESSOR) 25 MG tablet Take 12.5 mg by mouth 2 (two) times daily.  . Multiple Vitamin (MULTIVITAMIN WITH MINERALS) TABS tablet Take 1 tablet by mouth daily.  . ondansetron (ZOFRAN) 4 MG tablet Take 1 tablet (4 mg total) by mouth every 8 (eight) hours as needed for nausea or vomiting.  . pantoprazole (PROTONIX) 40  MG tablet Take 1 tablet (40 mg total) by mouth 2 (two) times daily before a meal. (Patient taking differently: Take 40 mg by mouth daily. )  . ULORIC 40 MG tablet Take 1 tablet by mouth daily.   No facility-administered encounter medications on file as of 12/13/2016.    ALLERGIES: Allergies  Allergen Reactions  . Insulin Glargine Swelling    "Makes me swell like a balloon all over", including face, but without any respiratory distress or rashes. Associated with weight gain.  . Statins Other (See Comments)    "I've tried them all; my muscle aches were so bad I couldn't walk".   VACCINATION STATUS:  There is no immunization history on file for this patient.  Diabetes  She presents for her follow-up diabetic visit. She has type 2 diabetes mellitus. Onset time: She was diagnosed at approximate age of 52 years. Her disease course has been worsening. Hypoglycemia symptoms include confusion, dizziness, mood changes and sweats. Pertinent negatives for hypoglycemia include no headaches, pallor or seizures. There are no diabetic associated symptoms. Pertinent negatives for diabetes include no chest pain, no polydipsia, no polyphagia and no polyuria. There are no hypoglycemic complications. Symptoms are worsening. Diabetic complications include nephropathy. Risk factors for coronary artery disease include diabetes mellitus, hypertension and post-menopausal. Current diabetic treatment includes oral agent (monotherapy) (She is taking glipizide 10 mg by mouth twice a day, her recent A1c is 5.3%. She reports frequent hypoglycemia into the 50s.). Her weight is increasing steadily. She is following a generally unhealthy diet. When asked about meal planning, she reported none. She has not had a previous visit with a dietitian. She never participates in exercise. Her home blood glucose trend is increasing steadily. Her breakfast blood glucose range is generally 180-200 mg/dl. Her lunch blood glucose range is generally  180-200 mg/dl. Her dinner blood glucose range is generally 180-200 mg/dl. Her bedtime blood glucose range is generally 180-200 mg/dl. Her overall blood glucose range is 180-200 mg/dl. An ACE inhibitor/angiotensin II receptor blocker is being taken. Eye exam is current.  Hypertension  This is a chronic problem. The current episode started more than 1 year ago. Associated symptoms include sweats. Pertinent negatives include no chest pain, headaches, palpitations or shortness of breath. Past treatments include calcium channel blockers and direct vasodilators. Hypertensive end-organ damage includes kidney disease.     Review of Systems  Constitutional: Negative for chills, fever and unexpected weight change.  HENT: Negative for trouble swallowing and voice change.   Eyes: Negative for visual disturbance.  Respiratory: Negative for cough, shortness of breath and wheezing.   Cardiovascular: Negative for chest pain, palpitations and leg swelling.  Gastrointestinal: Negative for diarrhea, nausea and vomiting.  Endocrine: Negative for cold intolerance, heat intolerance, polydipsia, polyphagia and polyuria.  Musculoskeletal: Negative for arthralgias and myalgias.  Skin: Negative for  color change, pallor, rash and wound.  Neurological: Positive for dizziness. Negative for seizures and headaches.  Psychiatric/Behavioral: Positive for confusion. Negative for suicidal ideas.    Objective:    BP (!) 146/66   Pulse 80   Ht 5\' 4"  (1.626 m)   Wt 185 lb (83.9 kg)   BMI 31.76 kg/m   Wt Readings from Last 3 Encounters:  12/13/16 185 lb (83.9 kg)  12/06/16 189 lb (85.7 kg)  11/23/16 189 lb (85.7 kg)    Physical Exam  Constitutional: She is oriented to person, place, and time. She appears well-developed.  HENT:  Head: Normocephalic and atraumatic.  Eyes: EOM are normal.  Neck: Normal range of motion. Neck supple. No tracheal deviation present. No thyromegaly present.  Cardiovascular: Normal rate and  regular rhythm.   Pulmonary/Chest: Effort normal and breath sounds normal.  Abdominal: Soft. Bowel sounds are normal. There is no tenderness. There is no guarding.  Musculoskeletal: Normal range of motion. She exhibits no edema.  Neurological: She is alert and oriented to person, place, and time. She has normal reflexes. No cranial nerve deficit. Coordination normal.  Skin: Skin is warm and dry. No rash noted. No erythema. No pallor.  Psychiatric: She has a normal mood and affect. Judgment normal.     CMP     Component Value Date/Time   NA 137 11/23/2016 1001   K 3.7 11/23/2016 1001   CL 100 (L) 11/23/2016 1001   CO2 25 11/23/2016 1001   GLUCOSE 275 (H) 11/23/2016 1001   BUN 49 (H) 11/23/2016 1001   CREATININE 2.47 (H) 11/23/2016 1001   CREATININE 2.58 (H) 10/25/2016 1026   CALCIUM 8.9 11/23/2016 1001   PROT 7.1 11/23/2016 1001   ALBUMIN 3.5 11/23/2016 1001   AST 23 11/23/2016 1001   ALT 13 (L) 11/23/2016 1001   ALKPHOS 55 11/23/2016 1001   BILITOT 0.6 11/23/2016 1001   GFRNONAA 18 (L) 11/23/2016 1001   GFRNONAA 17 (L) 10/25/2016 1026   GFRAA 21 (L) 11/23/2016 1001   GFRAA 20 (L) 10/25/2016 1026     Diabetic Labs (most recent): Lab Results  Component Value Date   HGBA1C 5.3 10/25/2016   HGBA1C 6.0 (H) 10/05/2016   HGBA1C 7.6 (H) 02/14/2015     Assessment & Plan:   1. Diabetes mellitus with stage 4 chronic kidney disease ([place])   - Patient has currently uncontrolled symptomatic type 2 DM since 76 years of age. - Over the last 7 days she has 30 readings averaging 190, her average for the last 30 days was 175. - A1c prior to her last visit in May 2018 was 5.3% , when she was on large dose of glipizide  with frequent hypoglycemia.  Recent labs reviewed, showing stage 4 CKD.   - her diabetes is complicated by CKD and patient remains at a high risk for more acute and chronic complications which include CAD, CVA, ERSD, retinopathy, and neuropathy. These are all discussed  in detail with the patient.  - I have counseled the patient on diet management and weight loss, by adopting a carbohydrate restricted/protein rich diet.  - Suggestion is made for patient to avoid simple carbohydrates   from her diet including Cakes , Desserts, Ice Cream,  Soda (  diet and regular) , Sweet Tea , Candies,  Chips, Cookies, Artificial Sweeteners,   and "Sugar-free" Products . This will help patient to have stable blood glucose profile and potentially avoid unintended weight gain.  - I encouraged the  patient to switch to  unprocessed or minimally processed complex starch and increased protein intake (animal or plant source), fruits, and vegetables.  - Patient is advised to stick to a routine mealtimes to eat 3 meals  a day and avoid unnecessary snacks ( to snack only to correct hypoglycemia).  - The patient will be scheduled with Jearld Fenton, RDN, CDE for individualized DM education.  - I have approached patient with the following individualized plan to manage diabetes and patient agrees:   -  Main goal in her therapy would be avoiding hypoglycemia. - Her presentation today is better and several ways including absence of hypoglycemia and more consistent readings of blood glucose averaging between 175 and 190.  - She needs treatment to achieve adequate control of diabetes, however she will stay off of sulfonylureas.  - I   discussed and initiated basal insulin therapy with  Toujeo 20 units daily at bedtime associated with monitoring of blood glucose 2 times daily-before breakfast and bedtime.    -Patient is encouraged to call clinic for blood glucose levels less than 70 or above 300 mg /dl.  -Patient is not a candidate for metformin, SGLT2 inhibitors  due to CKD.  - Patient specific target  A1c;  LDL, HDL, Triglycerides, and  Waist Circumference were discussed in detail.  2) BP/HTN: Uncontrolled. Continue current medications , not on ACEI/ARB- will be considered next visit if  not initiated by nephrology, she has a visit with nephrology next week. 3) Lipids/HPL:   Control unknown.   Patient is not on  Statins due to adverse reactions. 4)  Weight/Diet: CDE Consult will be initiated , exercise, and detailed carbohydrates information provided.  5) hypothyroidism: I advised her to continue LT4 25 mcg po qam. Will need TFTs on subsequent labs.  6) Chronic Care/Health Maintenance:  -Patient is not  on ACEI/ARB and Statin medications and encouraged to continue to follow up with Ophthalmology, Podiatrist at least yearly or according to recommendations, and advised to  stay away from smoking. I have recommended yearly flu vaccine and pneumonia vaccination at least every 5 years; moderate intensity exercise for up to 150 minutes weekly; and  sleep for at least 7 hours a day.  - 30 minutes of time was spent on the care of this patient , 50% of which was applied for counseling on diabetes complications and their preventions.  - Patient to bring meter and  blood glucose logs during her next visit.   - I advised patient to maintain close follow up with Meda Coffee Jennette Banker, MD for primary care needs.  Follow up plan: - Return in about 7 weeks (around 01/31/2017) for follow up with pre-visit labs, meter, and logs.  Glade [name], MD Phone: [phone]  Fax: [phone]   12/13/2016, 2:46 PM

## 2016-12-19 ENCOUNTER — Ambulatory Visit (HOSPITAL_COMMUNITY)
Admission: RE | Admit: 2016-12-19 | Discharge: 2016-12-19 | Disposition: A | Discharge status: Home / Self Care | Payer: Medicare HMO | Source: Ambulatory Visit | Attending: Nephrology | Admitting: Nephrology

## 2016-12-19 ENCOUNTER — Ambulatory Visit: Payer: Medicare PPO | Admitting: Gastroenterology

## 2016-12-19 DIAGNOSIS — N183 Chronic kidney disease, stage 3 unspecified: Secondary | ICD-10-CM

## 2016-12-19 DIAGNOSIS — Z1159 Encounter for screening for other viral diseases: Secondary | ICD-10-CM | POA: Diagnosis not present

## 2016-12-19 DIAGNOSIS — I10 Essential (primary) hypertension: Secondary | ICD-10-CM | POA: Diagnosis not present

## 2016-12-19 DIAGNOSIS — N281 Cyst of kidney, acquired: Secondary | ICD-10-CM | POA: Insufficient documentation

## 2016-12-19 DIAGNOSIS — E559 Vitamin D deficiency, unspecified: Secondary | ICD-10-CM | POA: Diagnosis not present

## 2016-12-19 DIAGNOSIS — R809 Proteinuria, unspecified: Secondary | ICD-10-CM | POA: Diagnosis not present

## 2016-12-19 DIAGNOSIS — D509 Iron deficiency anemia, unspecified: Secondary | ICD-10-CM | POA: Diagnosis not present

## 2016-12-19 DIAGNOSIS — Z79899 Other long term (current) drug therapy: Secondary | ICD-10-CM | POA: Diagnosis not present

## 2016-12-31 ENCOUNTER — Other Ambulatory Visit: Payer: Self-pay | Admitting: Family Medicine

## 2016-12-31 ENCOUNTER — Encounter: Payer: Self-pay | Admitting: Family Medicine

## 2017-01-01 ENCOUNTER — Other Ambulatory Visit: Payer: Self-pay

## 2017-01-01 DIAGNOSIS — I10 Essential (primary) hypertension: Secondary | ICD-10-CM | POA: Diagnosis not present

## 2017-01-01 DIAGNOSIS — N184 Chronic kidney disease, stage 4 (severe): Secondary | ICD-10-CM | POA: Diagnosis not present

## 2017-01-01 DIAGNOSIS — I509 Heart failure, unspecified: Secondary | ICD-10-CM | POA: Diagnosis not present

## 2017-01-01 DIAGNOSIS — D649 Anemia, unspecified: Secondary | ICD-10-CM | POA: Diagnosis not present

## 2017-01-01 MED ORDER — HYDRALAZINE HCL 25 MG PO TABS: 25.0000 mg | ORAL_TABLET | Freq: Three times a day (TID) | ORAL | 0 refills | Status: DC | PRN

## 2017-01-18 ENCOUNTER — Encounter: Payer: Self-pay | Admitting: Cardiology

## 2017-01-18 ENCOUNTER — Ambulatory Visit (INDEPENDENT_AMBULATORY_CARE_PROVIDER_SITE_OTHER): Payer: Medicare HMO | Admitting: Cardiology

## 2017-01-18 VITALS — BP 140/60 | HR 69 | Ht 64.0 in | Wt 185.6 lb

## 2017-01-18 DIAGNOSIS — I214 Non-ST elevation (NSTEMI) myocardial infarction: Secondary | ICD-10-CM

## 2017-01-18 NOTE — Progress Notes (Signed)
Clinical Summary [name] is a 76 y.o.female seen today for follow up of the following medical problems.   1. NSTEMI - 2 admissions over the last few months with elevated troponins in setting of severe anemia - her initial NSTEMI admission was at [facility] was treated medically including with DAPT, primarily due to advanced renal dysfunction and anemia. Peak troponin 8.5.  - readmitted 10/2016 with Hgb of 4.4 , elevated troponin again. DAPT stopped. Peak trop 1.8. 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 Her renal function and recurrent anemia has prohibited ischemic testing. She has statin allergy, no ACEI/ARB due to poor renal function  - weight down 5 lbs since 10/25/16 visit - no recent chest pain, no SOB. Occasional LE edema.  - 08/2016 echo at [facility]: EF 50-55% with inferior wall HK, impaired LV filling, fair study.  - no recent symptoms.   2. Anemia/GI bleed - followed by GI and heme/onc. SHe is to start procrit.  - received 4 units of blood during recent admission.      Past Medical History:  Diagnosis Date  . Anemia   . Arthritis   . Blood transfusion without reported diagnosis   . Cataract   . Chronic anemia   . Chronic combined systolic and diastolic CHF (congestive heart failure) ([place])    a. 2D echo 08/2016 at [facility]: EF 50-55% with inferior wall HK, impaired LV filling, fair study.  . CKD (chronic kidney disease), stage III   . Diabetes ([place])   . Gastritis   . GERD (gastroesophageal reflux disease)   . Gout   . HTN (hypertension)   . Hyperlipidemia   . Iron deficiency anemia 11/08/2016  . Normocytic anemia 10/26/2016  . NSTEMI (non-ST elevated myocardial infarction) ([place])    a. Complex admission 08/2016 - with severe hyperglycemia, AKI on CKD, severe anemia down to Hgb 6.8, acute combined CHF, troponin of 8.5, cath deferred due to renal dysfunction.  .  Thyroid disease      Allergies  Allergen Reactions  . Insulin Glargine Swelling    "Makes me swell like a balloon all over", including face, but without any respiratory distress or rashes. Associated with weight gain.  . Statins Other (See Comments)    "I've tried them all; my muscle aches were so bad I couldn't walk".     Current Outpatient Prescriptions  Medication Sig Dispense Refill  . amLODipine (NORVASC) 10 MG tablet Take 10 mg by mouth daily.    . ferrous sulfate 325 (65 FE) MG tablet Take 325 mg by mouth daily with breakfast.    . furosemide (LASIX) 20 MG tablet Take 20 mg by mouth as needed. Only has to take it when she gains 2 lbs    . gabapentin (NEURONTIN) 300 MG capsule Take 2 capsules by mouth at bedtime.     . hydrALAZINE (APRESOLINE) 25 MG tablet Take 1 tablet (25 mg total) by mouth every 8 (eight) hours as needed. 30 tablet 0  . Insulin Glargine (TOUJEO MAX SOLOSTAR) 300 UNIT/ML SOPN Inject 20 Units into the skin at bedtime. 3 pen 2  . Insulin Pen Needle (B-D ULTRAFINE III SHORT PEN) 31G X 8 MM MISC 1 each by Does not apply route as directed. 50 each 3  . levothyroxine (SYNTHROID, LEVOTHROID) 25 MCG tablet Take 25 mcg by mouth daily before breakfast.    . metoprolol tartrate (  LOPRESSOR) 25 MG tablet Take 12.5 mg by mouth 2 (two) times daily.    . Multiple Vitamin (MULTIVITAMIN WITH MINERALS) TABS tablet Take 1 tablet by mouth daily.    . ondansetron (ZOFRAN) 4 MG tablet Take 1 tablet (4 mg total) by mouth every 8 (eight) hours as needed for nausea or vomiting. 20 tablet 1  . pantoprazole (PROTONIX) 40 MG tablet Take 1 tablet (40 mg total) by mouth 2 (two) times daily before a meal. (Patient taking differently: Take 40 mg by mouth daily. ) 60 tablet 3  . ULORIC 40 MG tablet Take 1 tablet by mouth daily.     No current facility-administered medications for this visit.      Past Surgical History:  Procedure Laterality Date  . COLONOSCOPY WITH PROPOFOL N/A 10/08/2016    Procedure: COLONOSCOPY WITH PROPOFOL;  Surgeon: Ronnette Juniper, MD;  Location: [place];  Service: Gastroenterology;  Laterality: N/A;  . ESOPHAGOGASTRODUODENOSCOPY N/A 10/06/2016   Procedure: ESOPHAGOGASTRODUODENOSCOPY (EGD);  Surgeon: Otis Brace, MD;  Location: [facility] ENDOSCOPY;  Service: Gastroenterology;  Laterality: N/A;  . GIVENS CAPSULE STUDY  10/08/2016   Procedure: GIVENS CAPSULE STUDY;  Surgeon: Ronnette Juniper, MD;  Location: [place];  Service: Gastroenterology;;  . Freda Munro CAPSULE STUDY N/A 10/29/2016   Procedure: GIVENS CAPSULE STUDY;  Surgeon: Danie Binder, MD;  [name];  Service: Endoscopy;  Laterality: N/A;  . ORIF HUMERUS FRACTURE Left 02/07/2015   Procedure: OPEN REDUCTION INTERNAL FIXATION (ORIF) PROXIMAL HUMERUS FRACTURE;  Surgeon: Marybelle Killings, MD;  Location: [place];  Service: Orthopedics;  Laterality: Left;  . THYROID SURGERY    . TOTAL HIP ARTHROPLASTY Left 02/07/2015   Procedure: TOTAL HIP ARTHROPLASTY ANTERIOR APPROACH ;  Surgeon: Marybelle Killings, MD;  Location: [place];  Service: Orthopedics;  Laterality: Left;  . WRIST SURGERY Left      Allergies  Allergen Reactions  . Insulin Glargine Swelling    "Makes me swell like a balloon all over", including face, but without any respiratory distress or rashes. Associated with weight gain.  . Statins Other (See Comments)    "I've tried them all; my muscle aches were so bad I couldn't walk".      Family History  Problem Relation Age of Onset  . Diabetes Mother   . Asthma Mother   . Early death Mother 66       Pneumonia  . Early death Father        Killed at rodeo  . CAD Neg Hx   . GI Bleed Neg Hx      Social History Ms. Stelzner reports that she has never smoked. She has never used smokeless tobacco. [name] reports that she does not drink alcohol.   Review of Systems CONSTITUTIONAL: No weight loss, fever, chills, weakness or fatigue.  HEENT: Eyes: No visual loss, blurred vision, double  vision or yellow sclerae.No hearing loss, sneezing, congestion, runny nose or sore throat.  SKIN: No rash or itching.  CARDIOVASCULAR: per hpi RESPIRATORY: No shortness of breath, cough or sputum.  GASTROINTESTINAL: No anorexia, nausea, vomiting or diarrhea. No abdominal pain or blood.  GENITOURINARY: No burning on urination, no polyuria NEUROLOGICAL: No headache, dizziness, syncope, paralysis, ataxia, numbness or tingling in the extremities. No change in bowel or bladder control.  MUSCULOSKELETAL: No muscle, back pain, joint pain or stiffness.  LYMPHATICS: No enlarged nodes. No history of splenectomy.  PSYCHIATRIC: No history of depression or anxiety.  ENDOCRINOLOGIC: No reports of sweating, cold or  heat intolerance. No polyuria or polydipsia.  Marland Kitchen   Physical Examination Vitals:   01/18/17 1604  BP: 140/60  Pulse: 69  SpO2: 98%   Vitals:   01/18/17 1604  Weight: 185 lb 9.6 oz (84.2 kg)  Height: 5\' 4"  (1.626 m)    Gen: resting comfortably, no acute distress HEENT: no scleral icterus, pupils equal round and reactive, no palptable cervical adenopathy,  CV: RRR, no m/r/g  No jvd Resp: Clear to auscultation bilaterally GI: abdomen is soft, non-tender, non-distended, normal bowel sounds, no hepatosplenomegaly MSK: extremities are warm, no edema.  Skin: warm, no rash Neuro:  no focal deficits Psych: appropriate affect      Assessment and Plan  1. NSTEMI - significant troponin elevations during 2 prior admissions, both in the setting of severe anemia - suspect supply mismatch due to severe anemia, combined with probable chronic CAD given the degree of troponin elevations, particularly during her first admission. She has not had any significant cardiac symptoms - off DAPT at this time. We will touch base with GI about restarting plavix      Arnoldo Lenis, M.D.

## 2017-01-18 NOTE — Patient Instructions (Signed)

## 2017-01-25 ENCOUNTER — Telehealth: Payer: Self-pay

## 2017-01-25 ENCOUNTER — Encounter: Payer: Self-pay | Admitting: Family Medicine

## 2017-01-25 MED ORDER — AMLODIPINE BESYLATE 10 MG PO TABS: 10.0000 mg | ORAL_TABLET | Freq: Every day | ORAL | 3 refills | Status: DC

## 2017-01-25 NOTE — Telephone Encounter (Signed)
Seen [phone]

## 2017-01-29 ENCOUNTER — Telehealth: Payer: Self-pay | Admitting: *Deleted

## 2017-01-29 MED ORDER — CLOPIDOGREL BISULFATE 75 MG PO TABS: 75.0000 mg | ORAL_TABLET | Freq: Every day | ORAL | 1 refills | Status: DC

## 2017-01-29 NOTE — Telephone Encounter (Signed)
-----   Message from Arnoldo Lenis, MD sent at 01/24/2017  1:58 PM EDT ----- Can we restart this patients plavix 75mg  daily, let her know I heart back from GI docs and they were ok with it  J BrancH MD ----- Message ----- From: Annitta Needs, NP Sent: 01/24/2017  12:02 PM To: Arnoldo Lenis, MD  Hi! Yes, restarting Plavix is ok. I know she needs it. Like the idea of holding the aspirin. Thanks so much!   ----- Message ----- From: Arnoldo Lenis, MD Sent: 01/23/2017   6:26 PM To: Annitta Needs, NP  Hello, on our mutual patient how would you guys feel about restarting her plavix, we can hold the aspirin for now   Zandra Abts MD

## 2017-01-29 NOTE — Telephone Encounter (Signed)
Pt voiced understanding - was currently at [facility] and requested we send plavix there - says VM has been full and did not receive my voicemail left on 8/30

## 2017-01-30 ENCOUNTER — Other Ambulatory Visit: Payer: Self-pay | Admitting: Family Medicine

## 2017-01-30 MED ORDER — HYDRALAZINE HCL 25 MG PO TABS: 25.0000 mg | ORAL_TABLET | Freq: Three times a day (TID) | ORAL | 0 refills | Status: DC | PRN

## 2017-01-30 NOTE — Telephone Encounter (Signed)
Seen [phone]

## 2017-02-04 ENCOUNTER — Other Ambulatory Visit (HOSPITAL_COMMUNITY)
Admission: RE | Admit: 2017-02-04 | Discharge: 2017-02-04 | Disposition: A | Discharge status: Home / Self Care | Payer: Medicare HMO | Source: Ambulatory Visit | Attending: "Endocrinology | Admitting: "Endocrinology

## 2017-02-04 ENCOUNTER — Ambulatory Visit: Payer: Medicare HMO | Admitting: "Endocrinology

## 2017-02-04 DIAGNOSIS — E1122 Type 2 diabetes mellitus with diabetic chronic kidney disease: Secondary | ICD-10-CM | POA: Insufficient documentation

## 2017-02-04 DIAGNOSIS — E039 Hypothyroidism, unspecified: Secondary | ICD-10-CM | POA: Insufficient documentation

## 2017-02-04 DIAGNOSIS — N184 Chronic kidney disease, stage 4 (severe): Secondary | ICD-10-CM | POA: Insufficient documentation

## 2017-02-04 LAB — HEMOGLOBIN A1C
Hgb A1c MFr Bld: 6.8 % — ABNORMAL HIGH (ref 4.8–5.6)
Mean Plasma Glucose: 148.46 mg/dL

## 2017-02-04 LAB — TSH: TSH: 5.432 u[IU]/mL — AB (ref 0.350–4.500)

## 2017-02-04 LAB — RENAL FUNCTION PANEL
ALBUMIN: 3.6 g/dL (ref 3.5–5.0)
ANION GAP: 9 (ref 5–15)
BUN: 48 mg/dL — ABNORMAL HIGH (ref 6–20)
CALCIUM: 9.2 mg/dL (ref 8.9–10.3)
CO2: 26 mmol/L (ref 22–32)
Chloride: 105 mmol/L (ref 101–111)
Creatinine, Ser: 2.21 mg/dL — ABNORMAL HIGH (ref 0.44–1.00)
GFR, EST AFRICAN AMERICAN: 24 mL/min — AB (ref >60)
GFR, EST NON AFRICAN AMERICAN: 20 mL/min — AB (ref >60)
Glucose, Bld: 151 mg/dL — ABNORMAL HIGH (ref 65–99)
PHOSPHORUS: 4.1 mg/dL (ref 2.5–4.6)
POTASSIUM: 4 mmol/L (ref 3.5–5.1)
SODIUM: 140 mmol/L (ref 135–145)

## 2017-02-05 ENCOUNTER — Telehealth: Payer: Self-pay | Admitting: "Endocrinology

## 2017-02-05 LAB — T4, FREE: Free T4: 0.99 ng/dL (ref 0.61–1.12)

## 2017-02-05 MED ORDER — PEN NEEDLES 32G X 6 MM MISC: 1.0000 | Freq: Every day | 5 refills | Status: DC

## 2017-02-05 NOTE — Telephone Encounter (Signed)
[name] is calling stating that she needs a Rx for 32 gauge needles sent to her pharmacy, please advise?

## 2017-02-05 NOTE — Telephone Encounter (Signed)
Rx sent 

## 2017-02-13 ENCOUNTER — Encounter: Payer: Self-pay | Admitting: Family Medicine

## 2017-02-13 ENCOUNTER — Ambulatory Visit (INDEPENDENT_AMBULATORY_CARE_PROVIDER_SITE_OTHER): Payer: Medicare HMO | Admitting: Gastroenterology

## 2017-02-13 ENCOUNTER — Telehealth: Payer: Self-pay

## 2017-02-13 VITALS — BP 131/63 | HR 66 | Temp 97.1°F | Ht 64.0 in | Wt 183.2 lb

## 2017-02-13 DIAGNOSIS — D509 Iron deficiency anemia, unspecified: Secondary | ICD-10-CM | POA: Diagnosis not present

## 2017-02-13 MED ORDER — LEVOTHYROXINE SODIUM 25 MCG PO TABS: 25.0000 ug | ORAL_TABLET | Freq: Every day | ORAL | 3 refills | Status: DC

## 2017-02-13 MED ORDER — PANTOPRAZOLE SODIUM 40 MG PO TBEC: 40.0000 mg | DELAYED_RELEASE_TABLET | Freq: Every day | ORAL | 3 refills | Status: DC

## 2017-02-13 NOTE — Patient Instructions (Signed)
I am glad you are doing well!  Please let me know if you need anything.  I will see you in 6 months!

## 2017-02-13 NOTE — Telephone Encounter (Signed)
Seen [phone]

## 2017-02-13 NOTE — Progress Notes (Signed)
Referring Provider: [name], MD Primary Care Physician:  [name], MD Primary GI: Dr. Gala Romney   Chief Complaint  Patient presents with  . Anemia    HPI:   [name] is a 76 y.o. female presenting today with a history of profound anemia, with EGD/colonoscopy, and capsule completed in [place] May 2018. Hospitalized again at [facility] in June with acute on chronic anemia and repeat capsule study completed. Plavix has been restarted in Sept 2018 with aspirin on hold. Last Hgb 10.3 in June 2018. Followed by Hematology with next visit in a few weeks.   She has no overt GI bleeding and feels great. No N/V, abdominal pain, GERD exacerbation, constipation, diarrhea. She has a lot of energy and is fixing up things around her house. [name], her partner, is present. They are both pleased with how things are going and have no GI concerns whatsoever today. She is taking Protonix only once a day.   Past Medical History:  Diagnosis Date  . Anemia   . Arthritis   . Blood transfusion without reported diagnosis   . Cataract   . Chronic anemia   . Chronic combined systolic and diastolic CHF (congestive heart failure) ([place])    a. 2D echo 08/2016 at [facility]: EF 50-55% with inferior wall HK, impaired LV filling, fair study.  . CKD (chronic kidney disease), stage III   . Diabetes ([place])   . Gastritis   . GERD (gastroesophageal reflux disease)   . Gout   . HTN (hypertension)   . Hyperlipidemia   . Iron deficiency anemia 11/08/2016  . Normocytic anemia 10/26/2016  . NSTEMI (non-ST elevated myocardial infarction) ([place])    a. Complex admission 08/2016 - with severe hyperglycemia, AKI on CKD, severe anemia down to Hgb 6.8, acute combined CHF, troponin of 8.5, cath deferred due to renal dysfunction.  . Thyroid disease     Past Surgical History:  Procedure Laterality Date  . COLONOSCOPY WITH PROPOFOL N/A 10/08/2016   Procedure: COLONOSCOPY WITH PROPOFOL;  Surgeon: Ronnette Juniper, MD;   Location: [place];  Service: Gastroenterology;  Laterality: N/A;  . ESOPHAGOGASTRODUODENOSCOPY N/A 10/06/2016   Procedure: ESOPHAGOGASTRODUODENOSCOPY (EGD);  Surgeon: Otis Brace, MD;  Location: [facility] ENDOSCOPY;  Service: Gastroenterology;  Laterality: N/A;  . GIVENS CAPSULE STUDY  10/08/2016   Procedure: GIVENS CAPSULE STUDY;  Surgeon: Ronnette Juniper, MD;  Location: [place];  Service: Gastroenterology;;  . Freda Munro CAPSULE STUDY N/A 10/29/2016   Procedure: GIVENS CAPSULE STUDY;  Surgeon: Danie Binder, MD;  [name];  Service: Endoscopy;  Laterality: N/A;  . ORIF HUMERUS FRACTURE Left 02/07/2015   Procedure: OPEN REDUCTION INTERNAL FIXATION (ORIF) PROXIMAL HUMERUS FRACTURE;  Surgeon: Marybelle Killings, MD;  Location: [place];  Service: Orthopedics;  Laterality: Left;  . THYROID SURGERY    . TOTAL HIP ARTHROPLASTY Left 02/07/2015   Procedure: TOTAL HIP ARTHROPLASTY ANTERIOR APPROACH ;  Surgeon: Marybelle Killings, MD;  Location: [place];  Service: Orthopedics;  Laterality: Left;  . WRIST SURGERY Left     Current Outpatient Prescriptions  Medication Sig Dispense Refill  . amLODipine (NORVASC) 10 MG tablet Take 1 tablet (10 mg total) by mouth daily. 90 tablet 3  . clopidogrel (PLAVIX) 75 MG tablet Take 1 tablet (75 mg total) by mouth daily. 90 tablet 1  . ferrous sulfate 325 (65 FE) MG tablet Take 325 mg by mouth daily with breakfast.    . furosemide (LASIX) 20 MG tablet Take  20 mg by mouth as needed. Only has to take it when she gains 2 lbs    . gabapentin (NEURONTIN) 300 MG capsule Take 2 capsules by mouth at bedtime.     . hydrALAZINE (APRESOLINE) 25 MG tablet Take 1 tablet (25 mg total) by mouth every 8 (eight) hours as needed. 30 tablet 0  . Insulin Glargine (TOUJEO MAX SOLOSTAR) 300 UNIT/ML SOPN Inject 20 Units into the skin at bedtime. 3 pen 2  . Insulin Pen Needle (B-D ULTRAFINE III SHORT PEN) 31G X 8 MM MISC 1 each by Does not apply route as directed. 50 each 3  . Insulin Pen  Needle (PEN NEEDLES) 32G X 6 MM MISC 1 each by Does not apply route at bedtime. 100 each 5  . metoprolol tartrate (LOPRESSOR) 25 MG tablet Take 12.5 mg by mouth 2 (two) times daily.    . Multiple Vitamin (MULTIVITAMIN WITH MINERALS) TABS tablet Take 1 tablet by mouth daily.    . ondansetron (ZOFRAN) 4 MG tablet Take 1 tablet (4 mg total) by mouth every 8 (eight) hours as needed for nausea or vomiting. 20 tablet 1  . pantoprazole (PROTONIX) 40 MG tablet Take 1 tablet (40 mg total) by mouth daily. 30 minutes before breakfast 90 tablet 3  . ULORIC 40 MG tablet Take 1 tablet by mouth daily.    Marland Kitchen levothyroxine (SYNTHROID, LEVOTHROID) 50 MCG tablet Take 1 tablet (50 mcg total) by mouth daily before breakfast. 30 tablet 3   No current facility-administered medications for this visit.     Allergies as of 02/13/2017 - Review Complete 02/13/2017  Allergen Reaction Noted  . Insulin glargine Swelling 09/18/2016  . Statins Other (See Comments) 09/18/2016    Family History  Problem Relation Age of Onset  . Diabetes Mother   . Asthma Mother   . Early death Mother 77       Pneumonia  . Early death Father        Killed at [place]  . CAD Neg Hx   . GI Bleed Neg Hx     Social History   Social History  . Marital status: Widowed    Spouse name: [name]  . Number of children: 2  . Years of education: 12   Occupational History  . retired    Social History Main Topics  . Smoking status: Never Smoker  . Smokeless tobacco: Never Used  . Alcohol use No  . Drug use: No  . Sexual activity: Not Currently   Other Topics Concern  . Not on file   Social History Narrative   Lives with significant other/partner [name]   He is her caregiver   Moved from [facility]    Review of Systems: Gen: Denies fever, chills, anorexia. Denies fatigue, weakness, weight loss.  CV: Denies chest pain, palpitations, syncope, peripheral edema, and claudication. Resp: Denies dyspnea at rest, cough, wheezing, coughing up blood, and  pleurisy. GI: Denies vomiting blood, jaundice, and fecal incontinence.   Denies dysphagia or odynophagia. Derm: Denies rash, itching, dry skin Psych: Denies depression, anxiety, memory loss, confusion. No homicidal or suicidal ideation.  Heme: Denies bruising, bleeding, and enlarged lymph nodes.  Physical Exam: BP 131/63   Pulse 66   Temp (!) 97.1 F (36.2 C) (Oral)   Ht 5\' 4"  (1.626 m)   Wt 183 lb 3.2 oz (83.1 kg)   BMI 31.45 kg/m  General:   Alert and oriented. No distress noted. Pleasant and cooperative.  Head:  Normocephalic and  atraumatic. Eyes:  Conjuctiva clear without scleral icterus. Mouth:  Oral mucosa pink and moist. Good dentition. No lesions. Abdomen:  +BS, soft, non-tender and non-distended. No rebound or guarding. No HSM or masses noted. Msk:  Symmetrical without gross deformities. Normal posture. Extremities:  Without edema. Neurologic:  Alert and  oriented x4 Psych:  Alert and cooperative. Normal mood and affect.

## 2017-02-14 ENCOUNTER — Ambulatory Visit (INDEPENDENT_AMBULATORY_CARE_PROVIDER_SITE_OTHER): Payer: Medicare HMO | Admitting: "Endocrinology

## 2017-02-14 ENCOUNTER — Encounter: Payer: Self-pay | Admitting: "Endocrinology

## 2017-02-14 VITALS — BP 135/68 | HR 61 | Ht 64.0 in | Wt 180.0 lb

## 2017-02-14 DIAGNOSIS — I10 Essential (primary) hypertension: Secondary | ICD-10-CM | POA: Diagnosis not present

## 2017-02-14 DIAGNOSIS — E1122 Type 2 diabetes mellitus with diabetic chronic kidney disease: Secondary | ICD-10-CM | POA: Diagnosis not present

## 2017-02-14 DIAGNOSIS — E039 Hypothyroidism, unspecified: Secondary | ICD-10-CM | POA: Diagnosis not present

## 2017-02-14 DIAGNOSIS — N184 Chronic kidney disease, stage 4 (severe): Secondary | ICD-10-CM | POA: Diagnosis not present

## 2017-02-14 MED ORDER — LEVOTHYROXINE SODIUM 50 MCG PO TABS: 50.0000 ug | ORAL_TABLET | Freq: Every day | ORAL | 3 refills | Status: DC

## 2017-02-14 NOTE — Progress Notes (Signed)
Subjective:    Patient ID: [name], female    DOB: Jul 01, 1940. Patient is being seen in f/u for management of diabetes requested by  [name], MD  Past Medical History:  Diagnosis Date  . Anemia   . Arthritis   . Blood transfusion without reported diagnosis   . Cataract   . Chronic anemia   . Chronic combined systolic and diastolic CHF (congestive heart failure) ([place])    a. 2D echo 08/2016 at [facility]: EF 50-55% with inferior wall HK, impaired LV filling, fair study.  . CKD (chronic kidney disease), stage III   . Diabetes ([place])   . Gastritis   . GERD (gastroesophageal reflux disease)   . Gout   . HTN (hypertension)   . Hyperlipidemia   . Iron deficiency anemia 11/08/2016  . Normocytic anemia 10/26/2016  . NSTEMI (non-ST elevated myocardial infarction) ([place])    a. Complex admission 08/2016 - with severe hyperglycemia, AKI on CKD, severe anemia down to Hgb 6.8, acute combined CHF, troponin of 8.5, cath deferred due to renal dysfunction.  . Thyroid disease    Past Surgical History:  Procedure Laterality Date  . COLONOSCOPY WITH PROPOFOL N/A 10/08/2016   Procedure: COLONOSCOPY WITH PROPOFOL;  Surgeon: Ronnette Juniper, MD;  Location: [place];  Service: Gastroenterology;  Laterality: N/A;  . ESOPHAGOGASTRODUODENOSCOPY N/A 10/06/2016   Procedure: ESOPHAGOGASTRODUODENOSCOPY (EGD);  Surgeon: Otis Brace, MD;  Location: [facility] ENDOSCOPY;  Service: Gastroenterology;  Laterality: N/A;  . GIVENS CAPSULE STUDY  10/08/2016   Procedure: GIVENS CAPSULE STUDY;  Surgeon: Ronnette Juniper, MD;  Location: [place];  Service: Gastroenterology;;  . Freda Munro CAPSULE STUDY N/A 10/29/2016   Procedure: GIVENS CAPSULE STUDY;  Surgeon: Danie Binder, MD;  [name];  Service: Endoscopy;  Laterality: N/A;  . ORIF HUMERUS FRACTURE Left 02/07/2015   Procedure: OPEN REDUCTION INTERNAL FIXATION (ORIF) PROXIMAL HUMERUS FRACTURE;  Surgeon: Marybelle Killings, MD;  Location: [place];  Service:  Orthopedics;  Laterality: Left;  . THYROID SURGERY    . TOTAL HIP ARTHROPLASTY Left 02/07/2015   Procedure: TOTAL HIP ARTHROPLASTY ANTERIOR APPROACH ;  Surgeon: Marybelle Killings, MD;  Location: [place];  Service: Orthopedics;  Laterality: Left;  . WRIST SURGERY Left    Social History   Social History  . Marital status: Widowed    Spouse name: [name]  . Number of children: 2  . Years of education: 12   Occupational History  . retired    Social History Main Topics  . Smoking status: Never Smoker  . Smokeless tobacco: Never Used  . Alcohol use No  . Drug use: No  . Sexual activity: Not Currently   Other Topics Concern  . Not on file   Social History Narrative   Lives with significant other/partner [name]   He is her caregiver   Moved from [facility]   Outpatient Encounter Prescriptions as of 02/14/2017  Medication Sig  . amLODipine (NORVASC) 10 MG tablet Take 1 tablet (10 mg total) by mouth daily.  . clopidogrel (PLAVIX) 75 MG tablet Take 1 tablet (75 mg total) by mouth daily.  . ferrous sulfate 325 (65 FE) MG tablet Take 325 mg by mouth daily with breakfast.  . furosemide (LASIX) 20 MG tablet Take 20 mg by mouth as needed. Only has to take it when she gains 2 lbs  . gabapentin (NEURONTIN) 300 MG capsule Take 2 capsules by mouth at bedtime.   . hydrALAZINE (APRESOLINE) 25 MG tablet  Take 1 tablet (25 mg total) by mouth every 8 (eight) hours as needed.  . Insulin Glargine (TOUJEO MAX SOLOSTAR) 300 UNIT/ML SOPN Inject 20 Units into the skin at bedtime.  . Insulin Pen Needle (B-D ULTRAFINE III SHORT PEN) 31G X 8 MM MISC 1 each by Does not apply route as directed.  . Insulin Pen Needle (PEN NEEDLES) 32G X 6 MM MISC 1 each by Does not apply route at bedtime.  Marland Kitchen levothyroxine (SYNTHROID, LEVOTHROID) 50 MCG tablet Take 1 tablet (50 mcg total) by mouth daily before breakfast.  . metoprolol tartrate (LOPRESSOR) 25 MG tablet Take 12.5 mg by mouth 2 (two) times daily.  . Multiple Vitamin (MULTIVITAMIN WITH  MINERALS) TABS tablet Take 1 tablet by mouth daily.  . ondansetron (ZOFRAN) 4 MG tablet Take 1 tablet (4 mg total) by mouth every 8 (eight) hours as needed for nausea or vomiting.  . pantoprazole (PROTONIX) 40 MG tablet Take 1 tablet (40 mg total) by mouth daily. 30 minutes before breakfast  . ULORIC 40 MG tablet Take 1 tablet by mouth daily.  . [DISCONTINUED] levothyroxine (SYNTHROID, LEVOTHROID) 25 MCG tablet Take 1 tablet (25 mcg total) by mouth daily before breakfast.   No facility-administered encounter medications on file as of 02/14/2017.    ALLERGIES: Allergies  Allergen Reactions  . Insulin Glargine Swelling    "Makes me swell like a balloon all over", including face, but without any respiratory distress or rashes. Associated with weight gain.  . Statins Other (See Comments)    "I've tried them all; my muscle aches were so bad I couldn't walk".   VACCINATION STATUS:  There is no immunization history on file for this patient.  Diabetes  She presents for her follow-up diabetic visit. She has type 2 diabetes mellitus. Onset time: She was diagnosed at approximate age of 25 years. Her disease course has been improving. Hypoglycemia symptoms include confusion, dizziness, mood changes and sweats. Pertinent negatives for hypoglycemia include no headaches, pallor or seizures. There are no diabetic associated symptoms. Pertinent negatives for diabetes include no chest pain, no polydipsia, no polyphagia and no polyuria. There are no hypoglycemic complications. Symptoms are improving. Diabetic complications include nephropathy. Risk factors for coronary artery disease include diabetes mellitus, hypertension and post-menopausal. Current diabetic treatment includes oral agent (monotherapy) (She is taking glipizide 10 mg by mouth twice a day, her recent A1c is 5.3%. She reports frequent hypoglycemia into the 50s.). Her weight is decreasing steadily. She is following a generally unhealthy diet. When  asked about meal planning, she reported none. She has not had a previous visit with a dietitian. She never participates in exercise. Her home blood glucose trend is increasing steadily. Her breakfast blood glucose range is generally 130-140 mg/dl. Her bedtime blood glucose range is generally 140-180 mg/dl. Her overall blood glucose range is 140-180 mg/dl. An ACE inhibitor/angiotensin II receptor blocker is being taken. Eye exam is current.  Hypertension  This is a chronic problem. The current episode started more than 1 year ago. Associated symptoms include sweats. Pertinent negatives include no chest pain, headaches, palpitations or shortness of breath. Past treatments include calcium channel blockers and direct vasodilators. Hypertensive end-organ damage includes kidney disease.     Review of Systems  Constitutional: Negative for chills, fever and unexpected weight change.  HENT: Negative for trouble swallowing and voice change.   Eyes: Negative for visual disturbance.  Respiratory: Negative for cough, shortness of breath and wheezing.   Cardiovascular: Negative for chest pain, palpitations  and leg swelling.  Gastrointestinal: Negative for diarrhea, nausea and vomiting.  Endocrine: Negative for cold intolerance, heat intolerance, polydipsia, polyphagia and polyuria.  Musculoskeletal: Negative for arthralgias and myalgias.  Skin: Negative for color change, pallor, rash and wound.  Neurological: Positive for dizziness. Negative for seizures and headaches.  Psychiatric/Behavioral: Positive for confusion. Negative for suicidal ideas.    Objective:    BP 135/68   Pulse 61   Ht 5\' 4"  (1.626 m)   Wt 180 lb (81.6 kg)   BMI 30.90 kg/m   Wt Readings from Last 3 Encounters:  02/14/17 180 lb (81.6 kg)  02/13/17 183 lb 3.2 oz (83.1 kg)  01/18/17 185 lb 9.6 oz (84.2 kg)    Physical Exam  Constitutional: She is oriented to person, place, and time. She appears well-developed.  HENT:  Head:  Normocephalic and atraumatic.  Eyes: EOM are normal.  Neck: Normal range of motion. Neck supple. No tracheal deviation present. No thyromegaly present.  Cardiovascular: Normal rate and regular rhythm.   Pulmonary/Chest: Effort normal and breath sounds normal.  Abdominal: Soft. Bowel sounds are normal. There is no tenderness. There is no guarding.  Musculoskeletal: Normal range of motion. She exhibits no edema.  Neurological: She is alert and oriented to person, place, and time. She has normal reflexes. No cranial nerve deficit. Coordination normal.  Skin: Skin is warm and dry. No rash noted. No erythema. No pallor.  Psychiatric: She has a normal mood and affect. Judgment normal.     CMP     Component Value Date/Time   NA 140 02/04/2017 1514   K 4.0 02/04/2017 1514   CL 105 02/04/2017 1514   CO2 26 02/04/2017 1514   GLUCOSE 151 (H) 02/04/2017 1514   BUN 48 (H) 02/04/2017 1514   CREATININE 2.21 (H) 02/04/2017 1514   CREATININE 2.58 (H) 10/25/2016 1026   CALCIUM 9.2 02/04/2017 1514   PROT 7.1 11/23/2016 1001   ALBUMIN 3.6 02/04/2017 1514   AST 23 11/23/2016 1001   ALT 13 (L) 11/23/2016 1001   ALKPHOS 55 11/23/2016 1001   BILITOT 0.6 11/23/2016 1001   GFRNONAA 20 (L) 02/04/2017 1514   GFRNONAA 17 (L) 10/25/2016 1026   GFRAA 24 (L) 02/04/2017 1514   GFRAA 20 (L) 10/25/2016 1026     Diabetic Labs (most recent): Lab Results  Component Value Date   HGBA1C 6.8 (H) 02/04/2017   HGBA1C 5.3 10/25/2016   HGBA1C 6.0 (H) 10/05/2016   Results for NARIAH, MORGANO (MRN [id number]) as of 02/14/2017 15:17  Ref. Range 02/04/2017 15:16 02/04/2017 15:17  TSH Latest Ref Range: 0.350 - 4.500 uIU/mL 5.432 (H)   T4,Free(Direct) Latest Ref Range: 0.61 - 1.12 ng/dL  0.99     Assessment & Plan:   1. Diabetes mellitus with stage 4 chronic kidney disease ([place])   - Patient has currently uncontrolled symptomatic type 2 DM since 76 years of age.  -  She came with improved blood glucose profile,  no hypoglycemia. Her A1c is 6.8%, A1c prior to her last visit in May 2018 was 5.3% , when she was on large dose of glipizide  with frequent hypoglycemia.  Recent labs reviewed, showing stage 4 CKD- improving from stage 5.   - her diabetes is complicated by CKD and patient remains at a high risk for more acute and chronic complications which include CAD, CVA, ERSD, retinopathy, and neuropathy. These are all discussed in detail with the patient.  - I have counseled the patient on diet  management and weight loss, by adopting a carbohydrate restricted/protein rich diet.  - Suggestion is made for patient to avoid simple carbohydrates   from her diet including Cakes , Desserts, Ice Cream,  Soda (  diet and regular) , Sweet Tea , Candies,  Chips, Cookies, Artificial Sweeteners,   and "Sugar-free" Products . This will help patient to have stable blood glucose profile and potentially avoid unintended weight gain.  - I encouraged the patient to switch to  unprocessed or minimally processed complex starch and increased protein intake (animal or plant source), fruits, and vegetables.  - Patient is advised to stick to a routine mealtimes to eat 3 meals  a day and avoid unnecessary snacks ( to snack only to correct hypoglycemia).   - I have approached patient with the following individualized plan to manage diabetes and patient agrees:   -  Main goal in her therapy would be avoiding hypoglycemia. - Her presentation today is better in several ways including absence of hypoglycemia and more consistent readings of blood glucose averaging between 126 and 180.  - She needs treatment to achieve adequate control of diabetes, however she will stay off of sulfonylureas. - I   will continue basal insulin therapy with  Toujeo 20 units daily at bedtime associated with monitoring of blood glucose 2 times daily-before breakfast and bedtime.   -Patient is encouraged to call clinic for blood glucose levels less than 70 or  above 300 mg /dl. -Patient is not a candidate for metformin, SGLT2 inhibitors  due to CKD.  - Patient specific target  A1c;  LDL, HDL, Triglycerides, and  Waist Circumference were discussed in detail.  2) BP/HTN: controlled. Continue current medications , not on ACEI/ARB- will be considered next visit if not initiated by nephrology, she has a visit with nephrology next week. 3) Lipids/HPL:   Control unknown.   Patient is not on  Statins due to adverse reactions. 4)  Weight/Diet: CDE Consult will be initiated , exercise, and detailed carbohydrates information provided.  5) hypothyroidism: - Her thyroid function tests are consistent with inadequate replacement.  - I'll proceed to increase her levothyroxine to 50 g by mouth every morning.   - We discussed about correct intake of levothyroxine, at fasting, with water, separated by at least 30 minutes from breakfast, and separated by more than 4 hours from calcium, iron, multivitamins, acid reflux medications (PPIs). -Patient is made aware of the fact that thyroid hormone replacement is needed for life, dose to be adjusted by periodic monitoring of thyroid function tests.  6) Chronic Care/Health Maintenance:  -Patient is not  on ACEI/ARB and Statin medications and encouraged to continue to follow up with Ophthalmology, Podiatrist at least yearly or according to recommendations, and advised to  stay away from smoking. I have recommended yearly flu vaccine and pneumonia vaccination at least every 5 years,  and  sleep for at least 7 hours a day.  -  Time spent with the patient: 25 min, of which >50% was spent in reviewing her sugar logs , discussing her hypo- and hyper-glycemic episodes, reviewing her current and  previous labs and insulin doses and developing a plan to avoid hypo- and hyper-glycemia.   - I advised patient to maintain close follow up with Meda Coffee Jennette Banker, MD for primary care needs.  Follow up plan: - Return in about 4 months  (around 06/16/2017) for follow up with pre-visit labs, meter, and logs.  Glade [name], MD Phone: [phone]  Fax: [phone]  This note was partially dictated with voice recognition software. Similar sounding words can be transcribed inadequately or may not  be corrected upon review.  02/14/2017, 3:30 PM

## 2017-02-17 ENCOUNTER — Other Ambulatory Visit: Payer: Self-pay | Admitting: Family Medicine

## 2017-02-17 NOTE — Assessment & Plan Note (Signed)
76 year old female with history of profound anemia requiring hospitalization and extensive evaluation while inpatient. No overt GI bleeding. Plavix has been restarted with aspirin on hold. Now on Protonix just once daily. Will be seeing hematology in Oct 2018. Stable from a GI standpoint. Return in 6 months .

## 2017-02-18 MED ORDER — HYDRALAZINE HCL 25 MG PO TABS: 25.0000 mg | ORAL_TABLET | Freq: Three times a day (TID) | ORAL | 3 refills | Status: DC | PRN

## 2017-02-18 NOTE — Telephone Encounter (Signed)
Seen [phone]

## 2017-02-18 NOTE — Progress Notes (Signed)
CC'D TO PCP °

## 2017-03-01 ENCOUNTER — Other Ambulatory Visit (HOSPITAL_COMMUNITY): Payer: Medicare PPO

## 2017-03-01 ENCOUNTER — Ambulatory Visit (HOSPITAL_COMMUNITY): Payer: Medicare PPO

## 2017-03-04 ENCOUNTER — Other Ambulatory Visit (HOSPITAL_COMMUNITY): Payer: Medicare HMO

## 2017-03-04 ENCOUNTER — Ambulatory Visit (HOSPITAL_COMMUNITY): Payer: Self-pay | Admitting: Oncology

## 2017-03-08 ENCOUNTER — Inpatient Hospital Stay (HOSPITAL_COMMUNITY): Payer: Medicare HMO

## 2017-03-08 ENCOUNTER — Emergency Department (HOSPITAL_COMMUNITY): Payer: Medicare HMO

## 2017-03-08 ENCOUNTER — Encounter (HOSPITAL_COMMUNITY): Payer: Self-pay | Admitting: Emergency Medicine

## 2017-03-08 ENCOUNTER — Inpatient Hospital Stay (HOSPITAL_COMMUNITY)
Admission: EM | Admit: 2017-03-08 | Discharge: 2017-03-12 | DRG: 812 | Disposition: A | Discharge status: Home / Self Care | Payer: Medicare HMO | Attending: Family Medicine | Admitting: Family Medicine

## 2017-03-08 DIAGNOSIS — M1990 Unspecified osteoarthritis, unspecified site: Secondary | ICD-10-CM | POA: Diagnosis present

## 2017-03-08 DIAGNOSIS — K219 Gastro-esophageal reflux disease without esophagitis: Secondary | ICD-10-CM | POA: Diagnosis not present

## 2017-03-08 DIAGNOSIS — Z79899 Other long term (current) drug therapy: Secondary | ICD-10-CM

## 2017-03-08 DIAGNOSIS — N184 Chronic kidney disease, stage 4 (severe): Secondary | ICD-10-CM | POA: Diagnosis not present

## 2017-03-08 DIAGNOSIS — R531 Weakness: Secondary | ICD-10-CM | POA: Diagnosis not present

## 2017-03-08 DIAGNOSIS — I248 Other forms of acute ischemic heart disease: Secondary | ICD-10-CM | POA: Diagnosis not present

## 2017-03-08 DIAGNOSIS — Z7902 Long term (current) use of antithrombotics/antiplatelets: Secondary | ICD-10-CM

## 2017-03-08 DIAGNOSIS — R7989 Other specified abnormal findings of blood chemistry: Secondary | ICD-10-CM | POA: Diagnosis not present

## 2017-03-08 DIAGNOSIS — Z888 Allergy status to other drugs, medicaments and biological substances status: Secondary | ICD-10-CM | POA: Diagnosis not present

## 2017-03-08 DIAGNOSIS — D649 Anemia, unspecified: Secondary | ICD-10-CM | POA: Diagnosis present

## 2017-03-08 DIAGNOSIS — E1122 Type 2 diabetes mellitus with diabetic chronic kidney disease: Secondary | ICD-10-CM | POA: Diagnosis present

## 2017-03-08 DIAGNOSIS — R778 Other specified abnormalities of plasma proteins: Secondary | ICD-10-CM | POA: Diagnosis present

## 2017-03-08 DIAGNOSIS — I2510 Atherosclerotic heart disease of native coronary artery without angina pectoris: Secondary | ICD-10-CM | POA: Diagnosis present

## 2017-03-08 DIAGNOSIS — I639 Cerebral infarction, unspecified: Secondary | ICD-10-CM | POA: Diagnosis not present

## 2017-03-08 DIAGNOSIS — M109 Gout, unspecified: Secondary | ICD-10-CM | POA: Diagnosis present

## 2017-03-08 DIAGNOSIS — I5042 Chronic combined systolic (congestive) and diastolic (congestive) heart failure: Secondary | ICD-10-CM | POA: Diagnosis not present

## 2017-03-08 DIAGNOSIS — I252 Old myocardial infarction: Secondary | ICD-10-CM | POA: Diagnosis not present

## 2017-03-08 DIAGNOSIS — I10 Essential (primary) hypertension: Secondary | ICD-10-CM | POA: Diagnosis not present

## 2017-03-08 DIAGNOSIS — D509 Iron deficiency anemia, unspecified: Secondary | ICD-10-CM | POA: Diagnosis present

## 2017-03-08 DIAGNOSIS — D72829 Elevated white blood cell count, unspecified: Secondary | ICD-10-CM

## 2017-03-08 DIAGNOSIS — Z794 Long term (current) use of insulin: Secondary | ICD-10-CM | POA: Diagnosis not present

## 2017-03-08 DIAGNOSIS — Z833 Family history of diabetes mellitus: Secondary | ICD-10-CM | POA: Diagnosis not present

## 2017-03-08 DIAGNOSIS — I130 Hypertensive heart and chronic kidney disease with heart failure and stage 1 through stage 4 chronic kidney disease, or unspecified chronic kidney disease: Secondary | ICD-10-CM | POA: Diagnosis present

## 2017-03-08 DIAGNOSIS — Z9842 Cataract extraction status, left eye: Secondary | ICD-10-CM | POA: Diagnosis not present

## 2017-03-08 DIAGNOSIS — J810 Acute pulmonary edema: Secondary | ICD-10-CM | POA: Diagnosis not present

## 2017-03-08 DIAGNOSIS — R4781 Slurred speech: Secondary | ICD-10-CM | POA: Diagnosis present

## 2017-03-08 DIAGNOSIS — Z96642 Presence of left artificial hip joint: Secondary | ICD-10-CM | POA: Diagnosis present

## 2017-03-08 DIAGNOSIS — R509 Fever, unspecified: Secondary | ICD-10-CM | POA: Diagnosis present

## 2017-03-08 DIAGNOSIS — K3189 Other diseases of stomach and duodenum: Secondary | ICD-10-CM | POA: Diagnosis not present

## 2017-03-08 DIAGNOSIS — Z825 Family history of asthma and other chronic lower respiratory diseases: Secondary | ICD-10-CM | POA: Diagnosis not present

## 2017-03-08 DIAGNOSIS — K2970 Gastritis, unspecified, without bleeding: Secondary | ICD-10-CM | POA: Diagnosis present

## 2017-03-08 DIAGNOSIS — K922 Gastrointestinal hemorrhage, unspecified: Secondary | ICD-10-CM | POA: Diagnosis not present

## 2017-03-08 DIAGNOSIS — E039 Hypothyroidism, unspecified: Secondary | ICD-10-CM | POA: Diagnosis present

## 2017-03-08 DIAGNOSIS — R748 Abnormal levels of other serum enzymes: Secondary | ICD-10-CM | POA: Diagnosis not present

## 2017-03-08 DIAGNOSIS — D500 Iron deficiency anemia secondary to blood loss (chronic): Secondary | ICD-10-CM | POA: Diagnosis not present

## 2017-03-08 DIAGNOSIS — R079 Chest pain, unspecified: Secondary | ICD-10-CM | POA: Diagnosis not present

## 2017-03-08 DIAGNOSIS — E611 Iron deficiency: Secondary | ICD-10-CM | POA: Diagnosis not present

## 2017-03-08 LAB — URINALYSIS, ROUTINE W REFLEX MICROSCOPIC
BACTERIA UA: NONE SEEN
Bilirubin Urine: NEGATIVE
GLUCOSE, UA: 50 mg/dL — AB
Hgb urine dipstick: NEGATIVE
KETONES UR: NEGATIVE mg/dL
LEUKOCYTES UA: NEGATIVE
Nitrite: NEGATIVE
PROTEIN: 30 mg/dL — AB
Specific Gravity, Urine: 1.012 (ref 1.005–1.030)
pH: 5 (ref 5.0–8.0)

## 2017-03-08 LAB — CBC
HEMATOCRIT: 14.6 % — AB (ref 36.0–46.0)
Hemoglobin: 4.8 g/dL — CL (ref 12.0–15.0)
MCH: 31.2 pg (ref 26.0–34.0)
MCHC: 32.9 g/dL (ref 30.0–36.0)
MCV: 94.8 fL (ref 78.0–100.0)
Platelets: 302 10*3/uL (ref 150–400)
RBC: 1.54 MIL/uL — AB (ref 3.87–5.11)
RDW: 16.6 % — AB (ref 11.5–15.5)
WBC: 14.1 10*3/uL — AB (ref 4.0–10.5)

## 2017-03-08 LAB — RAPID URINE DRUG SCREEN, HOSP PERFORMED
Amphetamines: NOT DETECTED
BARBITURATES: NOT DETECTED
Benzodiazepines: NOT DETECTED
COCAINE: NOT DETECTED
OPIATES: NOT DETECTED
Tetrahydrocannabinol: NOT DETECTED

## 2017-03-08 LAB — COMPREHENSIVE METABOLIC PANEL
ALK PHOS: 56 U/L (ref 38–126)
ALT: 11 U/L — AB (ref 14–54)
AST: 26 U/L (ref 15–41)
Albumin: 3 g/dL — ABNORMAL LOW (ref 3.5–5.0)
Anion gap: 13 (ref 5–15)
BILIRUBIN TOTAL: 0.3 mg/dL (ref 0.3–1.2)
BUN: 96 mg/dL — ABNORMAL HIGH (ref 6–20)
CALCIUM: 8.4 mg/dL — AB (ref 8.9–10.3)
CO2: 21 mmol/L — ABNORMAL LOW (ref 22–32)
CREATININE: 3.02 mg/dL — AB (ref 0.44–1.00)
Chloride: 104 mmol/L (ref 101–111)
GFR, EST AFRICAN AMERICAN: 16 mL/min — AB (ref >60)
GFR, EST NON AFRICAN AMERICAN: 14 mL/min — AB (ref >60)
Glucose, Bld: 318 mg/dL — ABNORMAL HIGH (ref 65–99)
Potassium: 4.2 mmol/L (ref 3.5–5.1)
Sodium: 138 mmol/L (ref 135–145)
TOTAL PROTEIN: 5.8 g/dL — AB (ref 6.5–8.1)

## 2017-03-08 LAB — DIFFERENTIAL
Basophils Absolute: 0 10*3/uL (ref 0.0–0.1)
Basophils Relative: 0 %
EOS PCT: 0 %
Eosinophils Absolute: 0 10*3/uL (ref 0.0–0.7)
LYMPHS PCT: 10 %
Lymphs Abs: 1.4 10*3/uL (ref 0.7–4.0)
MONO ABS: 0.8 10*3/uL (ref 0.1–1.0)
MONOS PCT: 6 %
NEUTROS ABS: 11.9 10*3/uL — AB (ref 1.7–7.7)
Neutrophils Relative %: 84 %

## 2017-03-08 LAB — PROTIME-INR
INR: 1.24
Prothrombin Time: 15.5 seconds — ABNORMAL HIGH (ref 11.4–15.2)

## 2017-03-08 LAB — GLUCOSE, CAPILLARY: Glucose-Capillary: 289 mg/dL — ABNORMAL HIGH (ref 65–99)

## 2017-03-08 LAB — TROPONIN I: TROPONIN I: 3.97 ng/mL — AB (ref <0.03)

## 2017-03-08 LAB — CBG MONITORING, ED: Glucose-Capillary: 305 mg/dL — ABNORMAL HIGH (ref 65–99)

## 2017-03-08 LAB — PREPARE RBC (CROSSMATCH)

## 2017-03-08 LAB — ETHANOL

## 2017-03-08 LAB — APTT: aPTT: 26 seconds (ref 24–36)

## 2017-03-08 MED ORDER — SODIUM CHLORIDE 0.9 % IV BOLUS (SEPSIS)
500.0000 mL | Freq: Once | INTRAVENOUS | Status: AC
  Administered 2017-03-08: 500 mL via INTRAVENOUS

## 2017-03-08 MED ORDER — METOPROLOL TARTRATE 25 MG PO TABS
12.5000 mg | ORAL_TABLET | Freq: Two times a day (BID) | ORAL | Status: DC
  Administered 2017-03-08 – 2017-03-12 (×8): 12.5 mg via ORAL
  Filled 2017-03-08 (×8): qty 1

## 2017-03-08 MED ORDER — NITROGLYCERIN 0.4 MG SL SUBL: 0.4000 mg | SUBLINGUAL_TABLET | SUBLINGUAL | Status: DC | PRN

## 2017-03-08 MED ORDER — SODIUM CHLORIDE 0.9 % IV SOLN
10.0000 mL/h | Freq: Once | INTRAVENOUS | Status: AC
  Administered 2017-03-08: 10 mL/h via INTRAVENOUS

## 2017-03-08 MED ORDER — PANTOPRAZOLE SODIUM 40 MG IV SOLR
INTRAVENOUS | Status: AC
  Filled 2017-03-08: qty 80

## 2017-03-08 MED ORDER — ONDANSETRON HCL 4 MG/2ML IJ SOLN: 4.0000 mg | Freq: Three times a day (TID) | INTRAMUSCULAR | Status: DC | PRN

## 2017-03-08 MED ORDER — LEVOTHYROXINE SODIUM 50 MCG PO TABS
50.0000 ug | ORAL_TABLET | Freq: Every day | ORAL | Status: DC
  Administered 2017-03-09 – 2017-03-12 (×4): 50 ug via ORAL
  Filled 2017-03-08: qty 2
  Filled 2017-03-08 (×2): qty 1
  Filled 2017-03-08: qty 2

## 2017-03-08 MED ORDER — PANTOPRAZOLE SODIUM 40 MG IV SOLR: 40.0000 mg | Freq: Two times a day (BID) | INTRAVENOUS | Status: DC

## 2017-03-08 MED ORDER — INSULIN GLARGINE 300 UNIT/ML ~~LOC~~ SOPN: 14.0000 [IU] | PEN_INJECTOR | Freq: Every day | SUBCUTANEOUS | Status: DC

## 2017-03-08 MED ORDER — ACETAMINOPHEN 325 MG PO TABS
650.0000 mg | ORAL_TABLET | Freq: Four times a day (QID) | ORAL | Status: DC | PRN
  Administered 2017-03-09: 650 mg via ORAL
  Filled 2017-03-08: qty 2

## 2017-03-08 MED ORDER — GABAPENTIN 300 MG PO CAPS
600.0000 mg | ORAL_CAPSULE | Freq: Every day | ORAL | Status: DC
  Administered 2017-03-08: 600 mg via ORAL
  Filled 2017-03-08: qty 2

## 2017-03-08 MED ORDER — ZOLPIDEM TARTRATE 5 MG PO TABS: 5.0000 mg | ORAL_TABLET | Freq: Every evening | ORAL | Status: DC | PRN

## 2017-03-08 MED ORDER — FEBUXOSTAT 40 MG PO TABS
40.0000 mg | ORAL_TABLET | Freq: Every day | ORAL | Status: DC
  Administered 2017-03-09 – 2017-03-12 (×4): 40 mg via ORAL
  Filled 2017-03-08 (×4): qty 1

## 2017-03-08 MED ORDER — INSULIN ASPART 100 UNIT/ML ~~LOC~~ SOLN: 0.0000 [IU] | Freq: Three times a day (TID) | SUBCUTANEOUS | Status: DC

## 2017-03-08 MED ORDER — MORPHINE SULFATE (PF) 2 MG/ML IV SOLN: 1.0000 mg | INTRAVENOUS | Status: DC | PRN

## 2017-03-08 MED ORDER — ADULT MULTIVITAMIN W/MINERALS CH
1.0000 | ORAL_TABLET | Freq: Every day | ORAL | Status: DC
  Administered 2017-03-09 – 2017-03-12 (×4): 1 via ORAL
  Filled 2017-03-08 (×4): qty 1

## 2017-03-08 MED ORDER — HYDRALAZINE HCL 20 MG/ML IJ SOLN: 5.0000 mg | INTRAMUSCULAR | Status: DC | PRN

## 2017-03-08 MED ORDER — SODIUM CHLORIDE 0.9 % IV SOLN
Freq: Once | INTRAVENOUS | Status: AC
  Administered 2017-03-08: 22:00:00 via INTRAVENOUS

## 2017-03-08 MED ORDER — PANTOPRAZOLE SODIUM 40 MG IV SOLR
80.0000 mg | Freq: Once | INTRAVENOUS | Status: AC
  Administered 2017-03-08: 80 mg via INTRAVENOUS
  Filled 2017-03-08: qty 80

## 2017-03-08 MED ORDER — SODIUM CHLORIDE 0.9 % IV SOLN
8.0000 mg/h | INTRAVENOUS | Status: DC
  Administered 2017-03-08 – 2017-03-09 (×3): 8 mg/h via INTRAVENOUS
  Filled 2017-03-08 (×7): qty 80

## 2017-03-08 MED ORDER — INSULIN ASPART 100 UNIT/ML ~~LOC~~ SOLN
0.0000 [IU] | Freq: Every day | SUBCUTANEOUS | Status: DC
  Administered 2017-03-08: 3 [IU] via SUBCUTANEOUS

## 2017-03-08 MED ORDER — ACETAMINOPHEN 650 MG RE SUPP: 650.0000 mg | Freq: Four times a day (QID) | RECTAL | Status: DC | PRN

## 2017-03-08 MED ORDER — FERROUS SULFATE 325 (65 FE) MG PO TABS
325.0000 mg | ORAL_TABLET | Freq: Every day | ORAL | Status: DC
  Administered 2017-03-09 – 2017-03-10 (×2): 325 mg via ORAL
  Filled 2017-03-08 (×2): qty 1

## 2017-03-08 NOTE — ED Notes (Signed)
Date and time results received: 03/08/17 4:23 PM (use smartphrase ".now" to insert current time)  Test: Troponin Critical Value: 3.97  Name of Provider Notified: Mcmanus  Orders Received? Or Actions Taken?: Orders Received - See Orders for details

## 2017-03-08 NOTE — H&P (Addendum)
History and Physical    [name]:[id number] DOB: July 07, 1940 DOA: 03/08/2017  Referring MD/NP/PA:   PCP: [name], MD   Patient coming from:  The patient is coming from home.  At baseline, pt is independent for most of ADL.   Chief Complaint: Generalized weakness and slurred speech  HPI: [name] is a 76 y.o. female with medical history significant of GI bleeding, gastritis, hypertension, diabetes mellitus, GERD, hypothyroidism, gout, CAD, iron deficiency anemia, CKD-4, CAD, NSTEMI, who presents with generalized weakness, slurred speech.  Patient states that she has been having generalized weakness in the past 48 hours, which has been progressively getting worse. She had slurred speech for several hours intermittently yesterday, which has resolved today. Patient does not have unilateral numbness, tingliness in extremities. No vision change, facial droop or hearing loss. She did not notice any blood in the stool, but per her friend, patient had dark stool recently on wiping. Patient denies any nausea, vomiting, diarrhea, abdominal pain. She does not have symptoms of UTI, fever, chills, chest pain, shortness of breath, cough.  Of note, pt was hospitalized from 05/31-11/26/16 due to severe anemia. Her hgb was 3.8 and transfused with 4 units of blood. Pt had EGD and colonoscopy on 10/08/2016. She was found to have gastritis and 15 mm polyp in the transverse colon and resection was not attempted due to anticoagulation. She was also found to have diverticulosis in sigmoid colon and nonbleeding internal hemorrhoids. She also had camera capsule study on 10/08/2016 showing no mucosal abnormality within the small bowel. In that admission, GI recommended oral iron and follow-up hematology as an outpatient. Her aspirin and the Plavix were on hold. plavix was restarted 2 weeks ago. Pt states that she also had slurred speech when she had severe anemia in May.   ED Course: pt was found  to have hemoglobin dropped from 10.3 on 11/23/16-4.8, WBC 14.1, troponin 3.97 which was 1.36 on 60/1/18, INR 1.24, negative urinalysis, pending FOBT. slightly worsening renal function, temperature 99, no tachycardia, no tachypnea, oxygen saturation 94% on room air, negative chest x-ray. CT head is negative for acute intracranial abnormalities. Patient is admitted to stepdown as inpatient.  Review of Systems:   General: no fevers, chills, no body weight gain, has poor appetite, has fatigue HEENT: no blurry vision, hearing changes or sore throat Respiratory: no dyspnea, coughing, wheezing CV: no chest pain, no palpitations GI: no nausea, vomiting, abdominal pain, diarrhea, constipation, had dark stool. GU: no dysuria, burning on urination, increased urinary frequency, hematuria  Ext: no leg edema Neuro: no unilateral weakness, numbness, or tingling, no vision change or hearing loss. Had slurry speech. Skin: no rash, no skin tear. MSK: No muscle spasm, no deformity, no limitation of range of movement in spin Heme: No easy bruising.  Travel history: No recent long distant travel.  Allergy:  Allergies  Allergen Reactions  . Insulin Glargine Swelling    "Makes me swell like a balloon all over", including face, but without any respiratory distress or rashes. Associated with weight gain.  . Statins Other (See Comments)    "I've tried them all; my muscle aches were so bad I couldn't walk".    Past Medical History:  Diagnosis Date  . Anemia   . Arthritis   . Blood transfusion without reported diagnosis   . Cataract   . Chronic anemia   . Chronic combined systolic and diastolic CHF (congestive heart failure) ([place])    a. 2D echo 08/2016  at [facility]: EF 50-55% with inferior wall HK, impaired LV filling, fair study.  . CKD (chronic kidney disease), stage III ([place])   . Diabetes ([place])   . Gastritis   . GERD (gastroesophageal reflux disease)   . Gout   . HTN (hypertension)   . Hyperlipidemia   .  Iron deficiency anemia 11/08/2016  . Normocytic anemia 10/26/2016  . NSTEMI (non-ST elevated myocardial infarction) ([place])    a. Complex admission 08/2016 - with severe hyperglycemia, AKI on CKD, severe anemia down to Hgb 6.8, acute combined CHF, troponin of 8.5, cath deferred due to renal dysfunction.  . Thyroid disease     Past Surgical History:  Procedure Laterality Date  . COLONOSCOPY WITH PROPOFOL N/A 10/08/2016   Procedure: COLONOSCOPY WITH PROPOFOL;  Surgeon: Ronnette Juniper, MD;  Location: [place];  Service: Gastroenterology;  Laterality: N/A;  . ESOPHAGOGASTRODUODENOSCOPY N/A 10/06/2016   Procedure: ESOPHAGOGASTRODUODENOSCOPY (EGD);  Surgeon: Otis Brace, MD;  Location: [facility] ENDOSCOPY;  Service: Gastroenterology;  Laterality: N/A;  . GIVENS CAPSULE STUDY  10/08/2016   Procedure: GIVENS CAPSULE STUDY;  Surgeon: Ronnette Juniper, MD;  Location: [place];  Service: Gastroenterology;;  . Freda Munro CAPSULE STUDY N/A 10/29/2016   Procedure: GIVENS CAPSULE STUDY;  Surgeon: Danie Binder, MD;  [name];  Service: Endoscopy;  Laterality: N/A;  . ORIF HUMERUS FRACTURE Left 02/07/2015   Procedure: OPEN REDUCTION INTERNAL FIXATION (ORIF) PROXIMAL HUMERUS FRACTURE;  Surgeon: Marybelle Killings, MD;  Location: [place];  Service: Orthopedics;  Laterality: Left;  . THYROID SURGERY    . TOTAL HIP ARTHROPLASTY Left 02/07/2015   Procedure: TOTAL HIP ARTHROPLASTY ANTERIOR APPROACH ;  Surgeon: Marybelle Killings, MD;  Location: [place];  Service: Orthopedics;  Laterality: Left;  . WRIST SURGERY Left     Social History:  reports that she has never smoked. She has never used smokeless tobacco. She reports that she does not drink alcohol or use drugs.  Family History:  Family History  Problem Relation Age of Onset  . Diabetes Mother   . Asthma Mother   . Early death Mother 68       Pneumonia  . Early death Father        Killed at rodeo  . CAD Neg Hx   . GI Bleed Neg Hx      Prior to Admission  medications   Medication Sig Start Date End Date Taking? Authorizing Provider  amLODipine (NORVASC) 10 MG tablet Take 1 tablet (10 mg total) by mouth daily. 01/25/17   [name], MD  clopidogrel (PLAVIX) 75 MG tablet Take 1 tablet (75 mg total) by mouth daily. 01/29/17   Arnoldo Lenis, MD  ferrous sulfate 325 (65 FE) MG tablet Take 325 mg by mouth daily with breakfast.    [provider]  furosemide (LASIX) 20 MG tablet Take 20 mg by mouth as needed. Only has to take it when she gains 2 lbs 09/20/16   [provider]  gabapentin (NEURONTIN) 300 MG capsule Take 2 capsules by mouth at bedtime.  01/04/15   [provider]  hydrALAZINE (APRESOLINE) 25 MG tablet Take 1 tablet (25 mg total) by mouth every 8 (eight) hours as needed. 02/18/17   [name], MD  Insulin Glargine (TOUJEO MAX SOLOSTAR) 300 UNIT/ML SOPN Inject 20 Units into the skin at bedtime. 12/13/16   Cassandria Anger, MD  Insulin Pen Needle (B-D ULTRAFINE III SHORT PEN) 31G X 8 MM MISC 1 each by  Does not apply route as directed. 12/13/16   Cassandria Anger, MD  Insulin Pen Needle (PEN NEEDLES) 32G X 6 MM MISC 1 each by Does not apply route at bedtime. 02/05/17   Cassandria Anger, MD  levothyroxine (SYNTHROID, LEVOTHROID) 50 MCG tablet Take 1 tablet (50 mcg total) by mouth daily before breakfast. 02/14/17   Nida, Marella Chimes, MD  metoprolol tartrate (LOPRESSOR) 25 MG tablet Take 12.5 mg by mouth 2 (two) times daily.    [provider]  Multiple Vitamin (MULTIVITAMIN WITH MINERALS) TABS tablet Take 1 tablet by mouth daily.    [provider]  ondansetron (ZOFRAN) 4 MG tablet Take 1 tablet (4 mg total) by mouth every 8 (eight) hours as needed for nausea or vomiting. 10/25/16   [name], MD  pantoprazole (PROTONIX) 40 MG tablet Take 1 tablet (40 mg total) by mouth daily. 30 minutes before breakfast 02/13/17   Annitta Needs, NP  ULORIC 40 MG tablet Take 1 tablet by  mouth daily. 12/21/14   [provider]    Physical Exam: Vitals:   03/08/17 1752 03/08/17 1755 03/08/17 1800 03/08/17 1813  BP: (!) 121/46 (!) 119/46 (!) 124/50 (!) 120/48  Pulse: 87 86 87 87  Resp: 16 16 16 16   Temp: 99 F (37.2 C)   99.3 F (37.4 C)  TempSrc: Oral   Oral  SpO2: 94% 94% 94% 94%  Weight:      Height:       General: Not in acute distress. Pale looking. HEENT:       Eyes: PERRL, EOMI, no scleral icterus.       ENT: No discharge from the ears and nose, no pharynx injection, no tonsillar enlargement.        Neck: No JVD, no bruit, no mass felt. Heme: No neck lymph node enlargement. Cardiac: S1/S2, RRR, No murmurs, No gallops or rubs. Respiratory: No rales, wheezing, rhonchi or rubs. GI: Soft, nondistended, has minimal tenderness in central abdomen., no rebound pain, no organomegaly, BS present. GU: No hematuria Ext: No pitting leg edema bilaterally. 2+DP/PT pulse bilaterally. Musculoskeletal: No joint deformities, No joint redness or warmth, no limitation of ROM in spin. Skin: No rashes.  Neuro: Alert, oriented X3, cranial nerves II-XII grossly intact, moves all extremities normally.  Psych: Patient is not psychotic, no suicidal or hemocidal ideation.  Labs on Admission: I have personally reviewed following labs and imaging studies  CBC:  Recent Labs Lab 03/08/17 1527  WBC 14.1*  NEUTROABS 11.9*  HGB 4.8*  HCT 14.6*  MCV 94.8  PLT 585   Basic Metabolic Panel:  Recent Labs Lab 03/08/17 1527  NA 138  K 4.2  CL 104  CO2 21*  GLUCOSE 318*  BUN 96*  CREATININE 3.02*  CALCIUM 8.4*   GFR: Estimated Creatinine Clearance: 16.1 mL/min (A) (by C-G formula based on SCr of 3.02 mg/dL (H)). Liver Function Tests:  Recent Labs Lab 03/08/17 1527  AST 26  ALT 11*  ALKPHOS 56  BILITOT 0.3  PROT 5.8*  ALBUMIN 3.0*   No results for input(s): LIPASE, AMYLASE in the last 168 hours. No results for input(s): AMMONIA in the last 168  hours. Coagulation Profile:  Recent Labs Lab 03/08/17 1527  INR 1.24   Cardiac Enzymes:  Recent Labs Lab 03/08/17 1527  TROPONINI 3.97*   BNP (last 3 results) No results for input(s): PROBNP in the last 8760 hours. HbA1C: No results for input(s): HGBA1C in the last 72  hours. CBG:  Recent Labs Lab 03/08/17 1515  GLUCAP 305*   Lipid Profile: No results for input(s): CHOL, HDL, LDLCALC, TRIG, CHOLHDL, LDLDIRECT in the last 72 hours. Thyroid Function Tests: No results for input(s): TSH, T4TOTAL, FREET4, T3FREE, THYROIDAB in the last 72 hours. Anemia Panel: No results for input(s): VITAMINB12, FOLATE, FERRITIN, TIBC, IRON, RETICCTPCT in the last 72 hours. Urine analysis:    Component Value Date/Time   COLORURINE YELLOW 03/08/2017 1630   APPEARANCEUR CLEAR 03/08/2017 1630   LABSPEC 1.012 03/08/2017 1630   PHURINE 5.0 03/08/2017 1630   GLUCOSEU 50 (A) 03/08/2017 1630   HGBUR NEGATIVE 03/08/2017 1630   BILIRUBINUR NEGATIVE 03/08/2017 1630   KETONESUR NEGATIVE 03/08/2017 1630   PROTEINUR 30 (A) 03/08/2017 1630   UROBILINOGEN 0.2 02/08/2015 1615   NITRITE NEGATIVE 03/08/2017 1630   LEUKOCYTESUR NEGATIVE 03/08/2017 1630   Sepsis Labs: @LABRCNTIP (procalcitonin:4,lacticidven:4) )No results found for this or any previous visit (from the past 240 hour(s)).   Radiological Exams on Admission: Dg Chest 2 View  Result Date: 03/08/2017 CLINICAL DATA:  Weakness, onset of generalized weakness 48 hours ago, some slurred speech 24 hours ago intermittently, history CHF, diabetes mellitus, hypertension, NSTEMI, chronic kidney disease EXAM: CHEST  2 VIEW COMPARISON:  10/27/2016 FINDINGS: Enlargement of cardiac silhouette. Mediastinal contours and pulmonary vascularity normal. Lungs clear. No acute infiltrate, pleural effusion or pneumothorax. Bones demineralized with prior proximal LEFT humeral ORIF. IMPRESSION: Enlargement of cardiac silhouette. No acute abnormalities. Electronically  Signed   By: Lavonia Dana M.D.   On: 03/08/2017 17:44   Ct Head Wo Contrast  Result Date: 03/08/2017 CLINICAL DATA:  Slurred speech, weakness. EXAM: CT HEAD WITHOUT CONTRAST TECHNIQUE: Contiguous axial images were obtained from the base of the skull through the vertex without intravenous contrast. COMPARISON:  CT scan of Oct 25, 2016. FINDINGS: Brain: Mild chronic ischemic white matter disease. No mass effect or midline shift is noted. Ventricular size is within normal limits. There is no evidence of mass lesion, hemorrhage or acute infarction. Vascular: No hyperdense vessel or unexpected calcification. Skull: Normal. Negative for fracture or focal lesion. Sinuses/Orbits: No acute finding. Other: None. IMPRESSION: Mild chronic ischemic white matter disease. No acute intracranial abnormality seen. Electronically Signed   By: Marijo Conception, M.D.   On: 03/08/2017 17:20     EKG: Independently reviewed.  Sinus rhythm, QTC 448, early R-wave progression, no ischemic change.   Assessment/Plan Principal Problem:   Symptomatic anemia Active Problems:   Diabetes mellitus with stage 4 chronic kidney disease (HCC)   Gout   Chronic kidney disease, stage IV (severe) (HCC)   Elevated troponin   Iron deficiency anemia   Essential hypertension, benign   Hypothyroidism   Slurred speech   Symptomatic anemia and hx of iron deficiency anemia: Hgb 10.3 on 11/23/16-->4.8 today. Pt had extensive work up in the past, including EGD, colonoscopy, camera capsule study. She was found to have gastritis, diverticulosis and colon polyp. Her plavix was recently resumed 2 weeks ago, which has likely aggravated possible GI bleeding. currently pt is hemodynamically stable. Pending FOBT.   - will admit to sdU as inpt - transfuse 4 units of blood - NPO - IVF: 500 mL NS bolus - Start IV pantoprazole gtt - continue iron supplement - Zofran IV for nausea - Avoid NSAIDs and SQ heparin - Maintain IV access (2 large bore IVs  if possible). - Monitor closely and follow q6h cbc, transfuse as necessary for Hgb<7.0 - LaB: INR, PTT and type screen -  please call GI in AM ( her GI is Dr. Cristopher Estimable). Dr. Ronnette Juniper did EGD and colonoscopy in May.  Slurred speech: CT head is negative for acute intracranial abnormalities. Likely due to severe anemia. -MRI for brain to rule out stroke -swallowing screen -hold plavix due to possible GIB    Elevation of troponin: trop 3.97 in the setting of CKD-IV. No CP. Likely due to combination of a demand ischemia and decreased clearance of troponin secondary to CDK-IV. EDP discussed with card, Dr. Andrez Grime intervention due to severe anemia. -Hold aspirin and Plavix due to possible GI bleeding - cycle CE q6 x3 and repeat EKG in the am  - prn Nitroglycerin, Morphine - continue metoprolol - pt is allergic to statin  - Risk factor stratification: will check FLP (A1C =6.8 on 02/04/17) - 2d echo  Diabetes mellitus with stage 4 chronic kidney disease ([place]): Last A1c 6.8 on 02/04/17, well controled. Patient is taking glargine insulin at home. Pt state that she used to be allergic to glargine insuling, but has been tolerating glargine insulin (Toujeo) recently, but she seems to be not not very sure. -Start SSI  Gout: -continue home allopurinol   GERD: -Protonix iv as above  Hypothyroidism: Last TSH was 5.432 on 02/04/17 -Continue home Synthroid -Check TSH  HTN: -hold Lasix and the amlodipine due to severe anemia -IV hydralazine when necessary -Continue metoprolol  Chronic kidney disease, stage IV (severe) ([place]): Baseline creatinine 2.2-2.5, her creatinine is 3.02, BUN 96. Slightly worsening than the baseline. -Hold Lasix -Follow-up renal function. BMP    DVT ppx: SCD Code Status: Full code Family Communication:  Yes, patient's close friend  at bed side Disposition Plan:  Anticipate discharge back to previous home environment Consults called:  EDP discussed with card, Dr.  Johnsie Cancel Admission status:  SDU/inpation       Date of Service 03/08/2017    Ivor Costa [facility] Pager [phone]  If 7PM-7AM, please contact night-coverage [email or web address] Password [email or web address] 03/08/2017, 8:52 PM

## 2017-03-08 NOTE — ED Notes (Signed)
Date and time results received: 03/08/17 4:02 PM (use smartphrase ".now" to insert current time)  Test: Hemoglobin Critical Value: 4.8  Name of Provider Notified: Thurnell Garbe   Orders Received? Or Actions Taken?: Orders Received - See Orders for details

## 2017-03-08 NOTE — ED Provider Notes (Signed)
[facility] DEPT Provider Note   CSN: [id number] Arrival date & time: 03/08/17  1502     History   Chief Complaint Chief Complaint  Patient presents with  . Weakness    HPI [name] is a 76 y.o. female.  HPI  Pt was seen at 1535. Per pt and her family, c/o gradual onset and worsening of persistent generalized weakness for the past 2 days. Pt states she has had intermittent "slurred speech" for the past 2 days also. Pt describes her symptoms as "when my iron gets low." Pt endorses hx of chronic anemia with frequent transfusions with extensive GI evaluation. Denies black or bloody stools, no CP/palpitations, no SOB/cough, no abd pain, no N/V/D, no fevers.   Past Medical History:  Diagnosis Date  . Anemia   . Arthritis   . Blood transfusion without reported diagnosis   . Cataract   . Chronic anemia   . Chronic combined systolic and diastolic CHF (congestive heart failure) ([place])    a. 2D echo 08/2016 at [facility]: EF 50-55% with inferior wall HK, impaired LV filling, fair study.  . CKD (chronic kidney disease), stage III ([place])   . Diabetes ([place])   . Gastritis   . GERD (gastroesophageal reflux disease)   . Gout   . HTN (hypertension)   . Hyperlipidemia   . Iron deficiency anemia 11/08/2016  . Normocytic anemia 10/26/2016  . NSTEMI (non-ST elevated myocardial infarction) ([place])    a. Complex admission 08/2016 - with severe hyperglycemia, AKI on CKD, severe anemia down to Hgb 6.8, acute combined CHF, troponin of 8.5, cath deferred due to renal dysfunction.  . Thyroid disease     Patient Active Problem List   Diagnosis Date Noted  . Essential hypertension, benign 12/13/2016  . Hypothyroidism 12/13/2016  . Acute blood loss anemia 11/26/2016  . Iron deficiency anemia 11/08/2016  . Normocytic anemia 10/26/2016  . Elevated troponin   . Fatigue   . Melena   . Aortic atherosclerosis ([place]) 10/25/2016  . Symptomatic anemia 10/04/2016  . Acute kidney injury superimposed on CKD  ([place]) 10/04/2016  . History of non-ST elevation myocardial infarction (NSTEMI) 10/04/2016  . Hyperlipidemia 10/04/2016  . Neuropathy 09/20/2016  . Chronic kidney disease, stage IV (severe) ([place]) 08/03/2015  . Hip fracture ([place]) 02/05/2015  . Diabetes mellitus with stage 4 chronic kidney disease ([place]) 02/05/2015  . HTN (hypertension) 02/05/2015  . Gout 02/05/2015  . Left humeral fracture 02/05/2015  . Hyperlipidemia associated with type 2 diabetes mellitus ([place]) 05/01/2013    Past Surgical History:  Procedure Laterality Date  . COLONOSCOPY WITH PROPOFOL N/A 10/08/2016   Procedure: COLONOSCOPY WITH PROPOFOL;  Surgeon: Ronnette Juniper, MD;  Location: [place];  Service: Gastroenterology;  Laterality: N/A;  . ESOPHAGOGASTRODUODENOSCOPY N/A 10/06/2016   Procedure: ESOPHAGOGASTRODUODENOSCOPY (EGD);  Surgeon: Otis Brace, MD;  Location: [facility] END[facility]COPY;  Service: Gastroenterology;  Laterality: N/A;  . GIVENS CAPSULE STUDY  10/08/2016   Procedure: GIVENS CAPSULE STUDY;  Surgeon: Ronnette Juniper, MD;  Location: [place];  Service: Gastroenterology;;  . Freda Munro CAPSULE STUDY N/A 10/29/2016   Procedure: GIVENS CAPSULE STUDY;  Surgeon: Danie Binder, MD;  [name];  Service: Endoscopy;  Laterality: N/A;  . ORIF HUMERUS FRACTURE Left 02/07/2015   Procedure: OPEN REDUCTION INTERNAL FIXATION (ORIF) PROXIMAL HUMERUS FRACTURE;  Surgeon: Marybelle Killings, MD;  Location: [place];  Service: Orthopedics;  Laterality: Left;  . THYROID SURGERY    . TOTAL HIP ARTHROPLASTY Left 02/07/2015   Procedure:  TOTAL HIP ARTHROPLASTY ANTERIOR APPROACH ;  Surgeon: Marybelle Killings, MD;  Location: [place];  Service: Orthopedics;  Laterality: Left;  . WRIST SURGERY Left     OB History    No data available       Home Medications    Prior to Admission medications   Medication Sig Start Date End Date Taking? Authorizing Provider  amLODipine (NORVASC) 10 MG tablet Take 1 tablet (10 mg total) by mouth daily. 01/25/17    [name], MD  clopidogrel (PLAVIX) 75 MG tablet Take 1 tablet (75 mg total) by mouth daily. 01/29/17   Arnoldo Lenis, MD  ferrous sulfate 325 (65 FE) MG tablet Take 325 mg by mouth daily with breakfast.    [provider]  furosemide (LASIX) 20 MG tablet Take 20 mg by mouth as needed. Only has to take it when she gains 2 lbs 09/20/16   [provider]  gabapentin (NEURONTIN) 300 MG capsule Take 2 capsules by mouth at bedtime.  01/04/15   [provider]  hydrALAZINE (APRESOLINE) 25 MG tablet Take 1 tablet (25 mg total) by mouth every 8 (eight) hours as needed. 02/18/17   [name], MD  Insulin Glargine (TOUJEO MAX SOLOSTAR) 300 UNIT/ML SOPN Inject 20 Units into the skin at bedtime. 12/13/16   Cassandria Anger, MD  Insulin Pen Needle (B-D ULTRAFINE III SHORT PEN) 31G X 8 MM MISC 1 each by Does not apply route as directed. 12/13/16   Cassandria Anger, MD  Insulin Pen Needle (PEN NEEDLES) 32G X 6 MM MISC 1 each by Does not apply route at bedtime. 02/05/17   Cassandria Anger, MD  levothyroxine (SYNTHROID, LEVOTHROID) 50 MCG tablet Take 1 tablet (50 mcg total) by mouth daily before breakfast. 02/14/17   Nida, Marella Chimes, MD  metoprolol tartrate (LOPRESSOR) 25 MG tablet Take 12.5 mg by mouth 2 (two) times daily.    [provider]  Multiple Vitamin (MULTIVITAMIN WITH MINERALS) TABS tablet Take 1 tablet by mouth daily.    [provider]  ondansetron (ZOFRAN) 4 MG tablet Take 1 tablet (4 mg total) by mouth every 8 (eight) hours as needed for nausea or vomiting. 10/25/16   [name], MD  pantoprazole (PROTONIX) 40 MG tablet Take 1 tablet (40 mg total) by mouth daily. 30 minutes before breakfast 02/13/17   Annitta Needs, NP  ULORIC 40 MG tablet Take 1 tablet by mouth daily. 12/21/14   [provider]    Family History Family History  Problem Relation Age of Onset  . Diabetes Mother   . Asthma Mother   . Early  death Mother 52       Pneumonia  . Early death Father        Killed at rodeo  . CAD Neg Hx   . GI Bleed Neg Hx     Social History Social History  Substance Use Topics  . Smoking status: Never Smoker  . Smokeless tobacco: Never Used  . Alcohol use No     Allergies   Insulin glargine and Statins   Review of Systems Review of Systems ROS: Statement: All systems negative except as marked or noted in the HPI; Constitutional: +generalized weakness. Negative for fever and chills. ; ; Eyes: Negative for eye pain, redness and discharge. ; ; ENMT: Negative for ear pain, hoarseness, nasal congestion, sinus pressure and sore throat. ; ; Cardiovascular: Negative for chest pain, palpitations, diaphoresis, dyspnea and peripheral  edema. ; ; Respiratory: Negative for cough, wheezing and stridor. ; ; Gastrointestinal: Negative for nausea, vomiting, diarrhea, abdominal pain, blood in stool, hematemesis, jaundice and rectal bleeding. . ; ; Genitourinary: Negative for dysuria, flank pain and hematuria. ; ; Musculoskeletal: Negative for back pain and neck pain. Negative for swelling and trauma.; ; Skin: Negative for pruritus, rash, abrasions, blisters, bruising and skin lesion.; ; Neuro: +intermittent slurred speech. Negative for headache, lightheadedness and neck stiffness. Negative for weakness, altered level of consciousness, altered mental status, extremity weakness, paresthesias, involuntary movement, seizure and syncope.       Physical Exam Updated Vital Signs BP (!) 110/44 (BP Location: Right Arm)   Pulse 85   Temp 98.9 F (37.2 C) (Oral)   Resp 16   Ht 5\' 4"  (1.626 m)   Wt 81.6 kg (180 lb)   SpO2 98%   BMI 30.90 kg/m    Patient Vitals for the past 24 hrs:  BP Temp Temp src Pulse Resp SpO2 Height Weight  03/08/17 1800 (!) 124/50 - - 87 16 94 % - -  03/08/17 1755 (!) 119/46 - - 86 16 94 % - -  03/08/17 1752 (!) 121/46 99 F (37.2 C) Oral 87 16 94 % - -  03/08/17 1630 (!) 122/50 - - 87  18 - - -  03/08/17 1615 (!) 117/52 - - 83 15 - - -  03/08/17 1600 (!) 122/53 - - 81 17 94 % - -  03/08/17 1530 (!) 120/53 - - 77 16 (!) 86 % - -  03/08/17 1508 - - - - - - 5\' 4"  (1.626 m) 81.6 kg (180 lb)  03/08/17 1507 (!) 110/44 98.9 F (37.2 C) Oral 85 16 98 % - -      16:16 Orthostatic Vital Signs VB  Orthostatic Lying   BP- Lying: 117/52  Pulse- Lying: 83      Orthostatic Sitting  BP- Sitting: 127/56  Pulse- Sitting: 87      Orthostatic Standing at 0 minutes  BP- Standing at 0 minutes: 127/45  Pulse- Standing at 0 minutes: 100      Physical Exam 1540: Physical examination:  Nursing notes reviewed; Vital signs and O2 SAT reviewed;  Constitutional: Well developed, Well nourished, Well hydrated, In no acute distress; Head:  Normocephalic, atraumatic; Eyes: EOMI, PERRL, No scleral icterus. Conjunctiva pale.; ENMT: Mouth and pharynx normal, Mucous membranes moist; Neck: Supple, Full range of motion, No lymphadenopathy; Cardiovascular: Regular rate and rhythm, No gallop; Respiratory: Breath sounds clear & equal bilaterally, No wheezes.  Speaking full sentences with ease, Normal respiratory effort/excursion; Chest: Nontender, Movement normal; Abdomen: Soft, Nontender, Nondistended, Normal bowel sounds; Genitourinary: No CVA tenderness; Extremities: Pulses normal, No tenderness, No edema, No calf edema or asymmetry.; Neuro: AA&Ox3, Major CN grossly intact.  Speech clear. No gross focal motor or sensory deficits in extremities.; Skin: Color pale, Warm, Dry.   ED Treatments / Results  Labs (all labs ordered are listed, but only abnormal results are displayed)   EKG  EKG Interpretation  Date/Time:  Friday March 08 2017 15:17:06 EDT Ventricular Rate:  83 PR Interval:  140 QRS Duration: 84 QT Interval:  382 QTC Calculation: 448 R Axis:   25 Text Interpretation:  Normal sinus rhythm Nonspecific ST abnormality When compared with ECG of 10/25/2016 No significant change was  found Confirmed by Francine Graven [phone]) on 03/08/2017 3:43:33 PM       Radiology    Procedures Procedures (including critical care time)  Medications Ordered in ED Medications - No data to display   Initial Impression / Assessment and Plan / ED Course  I have reviewed the triage vital signs and the nursing notes.  Pertinent labs & imaging results that were available during my care of the patient were reviewed by me and considered in my medical decision making (see chart for details).  MDM Reviewed: previous chart, nursing note and vitals Reviewed previous: labs and ECG Interpretation: labs, ECG, x-ray and CT scan Total time providing critical care: 30-74 minutes. This excludes time spent performing separately reportable procedures and services. Consults: admitting MD   CRITICAL CARE Performed by: Alfonzo Feller Total critical care time: 35 minutes Critical care time was exclusive of separately billable procedures and treating other patients. Critical care was necessary to treat or prevent imminent or life-threatening deterioration. Critical care was time spent personally by me on the following activities: development of treatment plan with patient and/or surrogate as well as nursing, discussions with consultants, evaluation of patient's response to treatment, examination of patient, obtaining history from patient or surrogate, ordering and performing treatments and interventions, ordering and review of laboratory studies, ordering and review of radiographic studies, pulse oximetry and re-evaluation of patient's condition.  Results for orders placed or performed during the hospital encounter of 03/08/17  Ethanol  Result Value Ref Range   Alcohol, Ethyl (B) <10 <10 mg/dL  Protime-INR  Result Value Ref Range   Prothrombin Time 15.5 (H) 11.4 - 15.2 seconds   INR 1.24   APTT  Result Value Ref Range   aPTT 26 24 - 36 seconds  CBC  Result Value Ref Range   WBC 14.1  (H) 4.0 - 10.5 K/uL   RBC 1.54 (L) 3.87 - 5.11 MIL/uL   Hemoglobin 4.8 (LL) 12.0 - 15.0 g/dL   HCT 14.6 (L) 36.0 - 46.0 %   MCV 94.8 78.0 - 100.0 fL   MCH 31.2 26.0 - 34.0 pg   MCHC 32.9 30.0 - 36.0 g/dL   RDW 16.6 (H) 11.5 - 15.5 %   Platelets 302 150 - 400 K/uL  Differential  Result Value Ref Range   Neutrophils Relative % 84 %   Neutro Abs 11.9 (H) 1.7 - 7.7 K/uL   Lymphocytes Relative 10 %   Lymphs Abs 1.4 0.7 - 4.0 K/uL   Monocytes Relative 6 %   Monocytes Absolute 0.8 0.1 - 1.0 K/uL   Eosinophils Relative 0 %   Eosinophils Absolute 0.0 0.0 - 0.7 K/uL   Basophils Relative 0 %   Basophils Absolute 0.0 0.0 - 0.1 K/uL  Comprehensive metabolic panel  Result Value Ref Range   Sodium 138 135 - 145 mmol/L   Potassium 4.2 3.5 - 5.1 mmol/L   Chloride 104 101 - 111 mmol/L   CO2 21 (L) 22 - 32 mmol/L   Glucose, Bld 318 (H) 65 - 99 mg/dL   BUN 96 (H) 6 - 20 mg/dL   Creatinine, Ser 3.02 (H) 0.44 - 1.00 mg/dL   Calcium 8.4 (L) 8.9 - 10.3 mg/dL   Total Protein 5.8 (L) 6.5 - 8.1 g/dL   Albumin 3.0 (L) 3.5 - 5.0 g/dL   AST 26 15 - 41 U/L   ALT 11 (L) 14 - 54 U/L   Alkaline Phosphatase 56 38 - 126 U/L   Total Bilirubin 0.3 0.3 - 1.2 mg/dL   GFR calc non Af Amer 14 (L) >60 mL/min   GFR calc Af Amer 16 (L) >60 mL/min  Anion gap 13 5 - 15  Urine rapid drug screen (hosp performed)not at [facility]  Result Value Ref Range   Opiates NONE DETECTED NONE DETECTED   Cocaine NONE DETECTED NONE DETECTED   Benzodiazepines NONE DETECTED NONE DETECTED   Amphetamines NONE DETECTED NONE DETECTED   Tetrahydrocannabinol NONE DETECTED NONE DETECTED   Barbiturates NONE DETECTED NONE DETECTED  Urinalysis, Routine w reflex microscopic  Result Value Ref Range   Color, Urine YELLOW YELLOW   APPearance CLEAR CLEAR   Specific Gravity, Urine 1.012 1.005 - 1.030   pH 5.0 5.0 - 8.0   Glucose, UA 50 (A) NEGATIVE mg/dL   Hgb urine dipstick NEGATIVE NEGATIVE   Bilirubin Urine NEGATIVE NEGATIVE   Ketones, ur  NEGATIVE NEGATIVE mg/dL   Protein, ur 30 (A) NEGATIVE mg/dL   Nitrite NEGATIVE NEGATIVE   Leukocytes, UA NEGATIVE NEGATIVE   RBC / HPF 0-5 0 - 5 RBC/hpf   WBC, UA 0-5 0 - 5 WBC/hpf   Bacteria, UA NONE SEEN NONE SEEN   Squamous Epithelial / LPF 0-5 (A) NONE SEEN  Troponin I  Result Value Ref Range   Troponin I 3.97 (HH) <0.03 ng/mL  CBG monitoring, ED  Result Value Ref Range   Glucose-Capillary 305 (H) 65 - 99 mg/dL  Type and screen [facility]  Result Value Ref Range   ABO/RH(D) B POS    Antibody Screen NEG    Sample Expiration 03/11/2017    Unit Number [id number]    Blood Component Type RED CELLS,LR    Unit division 00    Status of Unit ALLOCATED    Transfusion Status OK TO TRANSFUSE    Crossmatch Result Compatible    Unit Number [id number]    Blood Component Type RED CELLS,LR    Unit division 00    Status of Unit ISSUED    Transfusion Status OK TO TRANSFUSE    Crossmatch Result Compatible    Unit Number [id number]    Blood Component Type RED CELLS,LR    Unit division 00    Status of Unit ALLOCATED    Transfusion Status OK TO TRANSFUSE    Crossmatch Result Compatible    Unit Number [id number]    Blood Component Type RBC LR PHER1    Unit division 00    Status of Unit ALLOCATED    Transfusion Status OK TO TRANSFUSE    Crossmatch Result Compatible   Prepare RBC  Result Value Ref Range   Order Confirmation ORDER PROCESSED BY BLOOD BANK   BPAM RBC  Result Value Ref Range   Blood Product Unit Number [id number]    Unit Type and Rh 5100    Blood Product Expiration Date 932355732202    ISSUE DATE / TIME 542706237628    Blood Product Unit Number [id number]    PRODUCT CODE [id number]    Unit Type and Rh 5100    Blood Product Expiration Date 462703500938    Blood Product Unit Number [id number]    Unit Type and Rh 5100    Blood Product Expiration Date 938101751025    Blood Product Unit Number [id number]    Unit Type and Rh 5100    Blood  Product Expiration Date 144315400867     Dg Chest 2 View Result Date: 03/08/2017 CLINICAL DATA:  Weakness, onset of generalized weakness 48 hours ago, some slurred speech 24 hours ago intermittently, history CHF, diabetes mellitus, hypertension, NSTEMI, chronic kidney disease EXAM: CHEST  2 VIEW COMPARISON:  10/27/2016 FINDINGS: Enlargement of cardiac  silhouette. Mediastinal contours and pulmonary vascularity normal. Lungs clear. No acute infiltrate, pleural effusion or pneumothorax. Bones demineralized with prior proximal LEFT humeral ORIF. IMPRESSION: Enlargement of cardiac silhouette. No acute abnormalities. Electronically Signed   By: Lavonia Dana M.D.   On: 03/08/2017 17:44   Ct Head Wo Contrast Result Date: 03/08/2017 CLINICAL DATA:  Slurred speech, weakness. EXAM: CT HEAD WITHOUT CONTRAST TECHNIQUE: Contiguous axial images were obtained from the base of the skull through the vertex without intravenous contrast. COMPARISON:  CT scan of Oct 25, 2016. FINDINGS: Brain: Mild chronic ischemic white matter disease. No mass effect or midline shift is noted. Ventricular size is within normal limits. There is no evidence of mass lesion, hemorrhage or acute infarction. Vascular: No hyperdense vessel or unexpected calcification. Skull: Normal. Negative for fracture or focal lesion. Sinuses/Orbits: No acute finding. Other: None. IMPRESSION: Mild chronic ischemic white matter disease. No acute intracranial abnormality seen. Electronically Signed   By: Marijo Conception, M.D.   On: 03/08/2017 17:20     Results for KHLOE, HUNKELE (MRN [id number]) as of 03/08/2017 16:38  Ref. Range 10/29/2016 04:54 11/07/2016 12:35 11/23/2016 10:01 02/04/2017 15:14 03/08/2017 15:27  BUN Latest Ref Range: 6 - 20 mg/dL 46 (H) 42 (H) 49 (H) 48 (H) 96 (H)  Creatinine Latest Ref Range: 0.44 - 1.00 mg/dL 2.25 (H) 2.56 (H) 2.47 (H) 2.21 (H) 3.02 (H)   Results for EVALYNNE, LOCURTO (MRN [id number]) as of 03/08/2017 16:38  Ref. Range  10/26/2016 04:54 10/26/2016 09:32 10/26/2016 18:46 11/07/2016 12:35 03/08/2017 15:27  Troponin I Latest Ref Range: <0.03 ng/mL 1.60 (HH) 1.80 (HH) 1.36 (HH)  3.97 [facility])   Results for ABIGALE, DOROW (MRN [id number]) as of 03/08/2017 16:38  Ref. Range 10/30/2016 04:29 11/07/2016 10:35 11/23/2016 10:01 03/08/2017 15:27  Hemoglobin Latest Ref Range: 12.0 - 15.0 g/dL 8.4 (L) 9.8 (L) 10.3 (L) 4.8 (LL)  HCT Latest Ref Range: 36.0 - 46.0 % 26.8 (L) 31.1 (L) 31.8 (L) 14.6 (L)      1630:  Troponin elevated from previous, EKG without ST elevation; likely demand from anemia. PRBC's transfusion ordered.  T/C to [facility] Dr. Johnsie Cancel, case discussed, including:  HPI, pertinent PM/SHx, VS/PE, dx testing, ED course and treatment:  No intervention at this time due to profound anemia, requests to admit to hospitalist service.   1810:  T/C to [facility]. Blaine Hamper, case discussed, including:  HPI, pertinent PM/SHx, VS/PE, dx testing, ED course and treatment:  Agreeable to admit.     Final Clinical Impressions(s) / ED Diagnoses   Final diagnoses:  None    New Prescriptions New Prescriptions   No medications on file     Francine Graven, DO 03/10/17 2252

## 2017-03-08 NOTE — ED Triage Notes (Signed)
Pt states she began feeling generalized weakness about 48 hours ago, has had some slurred 24 hours ago intermittently. Pt is alert and oriented X4 at this time. States this usually has these sx with low iron levels. Denies any hematuria, hematemesis, blood in stool, or dark tarry stool. Is scheduled for iron infusion in next week.

## 2017-03-09 ENCOUNTER — Inpatient Hospital Stay (HOSPITAL_COMMUNITY): Payer: Medicare HMO

## 2017-03-09 DIAGNOSIS — N184 Chronic kidney disease, stage 4 (severe): Secondary | ICD-10-CM

## 2017-03-09 DIAGNOSIS — E1122 Type 2 diabetes mellitus with diabetic chronic kidney disease: Secondary | ICD-10-CM

## 2017-03-09 DIAGNOSIS — D649 Anemia, unspecified: Secondary | ICD-10-CM

## 2017-03-09 DIAGNOSIS — R748 Abnormal levels of other serum enzymes: Secondary | ICD-10-CM

## 2017-03-09 DIAGNOSIS — R079 Chest pain, unspecified: Secondary | ICD-10-CM

## 2017-03-09 DIAGNOSIS — R4781 Slurred speech: Secondary | ICD-10-CM

## 2017-03-09 DIAGNOSIS — D509 Iron deficiency anemia, unspecified: Secondary | ICD-10-CM

## 2017-03-09 DIAGNOSIS — E039 Hypothyroidism, unspecified: Secondary | ICD-10-CM

## 2017-03-09 LAB — TROPONIN I
TROPONIN I: 3.06 ng/mL — AB (ref <0.03)
TROPONIN I: 3.58 ng/mL — AB (ref <0.03)
TROPONIN I: 2.06 ng/mL — AB (ref <0.03)

## 2017-03-09 LAB — LIPID PANEL
CHOLESTEROL: 238 mg/dL — AB (ref 0–200)
HDL: 27 mg/dL — ABNORMAL LOW (ref >40)
LDL Cholesterol: UNDETERMINED mg/dL (ref 0–99)
Total CHOL/HDL Ratio: 8.8 RATIO
Triglycerides: 476 mg/dL — ABNORMAL HIGH (ref <150)
VLDL: UNDETERMINED mg/dL (ref 0–40)

## 2017-03-09 LAB — BASIC METABOLIC PANEL
ANION GAP: 12 (ref 5–15)
BUN: 91 mg/dL — AB (ref 6–20)
CO2: 21 mmol/L — ABNORMAL LOW (ref 22–32)
Calcium: 8 mg/dL — ABNORMAL LOW (ref 8.9–10.3)
Chloride: 107 mmol/L (ref 101–111)
Creatinine, Ser: 2.78 mg/dL — ABNORMAL HIGH (ref 0.44–1.00)
GFR, EST AFRICAN AMERICAN: 18 mL/min — AB (ref >60)
GFR, EST NON AFRICAN AMERICAN: 15 mL/min — AB (ref >60)
GLUCOSE: 320 mg/dL — AB (ref 65–99)
POTASSIUM: 4 mmol/L (ref 3.5–5.1)
Sodium: 140 mmol/L (ref 135–145)

## 2017-03-09 LAB — GLUCOSE, CAPILLARY
GLUCOSE-CAPILLARY: 263 mg/dL — AB (ref 65–99)
GLUCOSE-CAPILLARY: 294 mg/dL — AB (ref 65–99)
GLUCOSE-CAPILLARY: 352 mg/dL — AB (ref 65–99)
Glucose-Capillary: 319 mg/dL — ABNORMAL HIGH (ref 65–99)

## 2017-03-09 LAB — CBC
HCT: 19.5 % — ABNORMAL LOW (ref 36.0–46.0)
HEMATOCRIT: 25.3 % — AB (ref 36.0–46.0)
HEMOGLOBIN: 6.3 g/dL — AB (ref 12.0–15.0)
Hemoglobin: 8.4 g/dL — ABNORMAL LOW (ref 12.0–15.0)
MCH: 30.1 pg (ref 26.0–34.0)
MCH: 30.3 pg (ref 26.0–34.0)
MCHC: 32.3 g/dL (ref 30.0–36.0)
MCHC: 33.2 g/dL (ref 30.0–36.0)
MCV: 93.3 fL (ref 78.0–100.0)
MCV: 91.3 fL (ref 78.0–100.0)
PLATELETS: 252 10*3/uL (ref 150–400)
Platelets: 322 10*3/uL (ref 150–400)
RBC: 2.09 MIL/uL — ABNORMAL LOW (ref 3.87–5.11)
RBC: 2.77 MIL/uL — ABNORMAL LOW (ref 3.87–5.11)
RDW: 16.7 % — AB (ref 11.5–15.5)
RDW: 16.6 % — AB (ref 11.5–15.5)
WBC: 17.5 10*3/uL — AB (ref 4.0–10.5)
WBC: 16.3 10*3/uL — ABNORMAL HIGH (ref 4.0–10.5)

## 2017-03-09 LAB — IRON AND TIBC
Iron: 37 ug/dL (ref 28–170)
Saturation Ratios: 15 % (ref 10.4–31.8)
TIBC: 239 ug/dL — AB (ref 250–450)
UIBC: 202 ug/dL

## 2017-03-09 LAB — URINALYSIS, ROUTINE W REFLEX MICROSCOPIC
BILIRUBIN URINE: NEGATIVE
Glucose, UA: 50 mg/dL — AB
Hgb urine dipstick: NEGATIVE
KETONES UR: NEGATIVE mg/dL
LEUKOCYTES UA: NEGATIVE
NITRITE: NEGATIVE
PH: 5 (ref 5.0–8.0)
PROTEIN: 30 mg/dL — AB
Specific Gravity, Urine: 1.013 (ref 1.005–1.030)

## 2017-03-09 LAB — ECHOCARDIOGRAM COMPLETE
HEIGHTINCHES: 64 in
WEIGHTICAEL: 2839.52 [oz_av]

## 2017-03-09 LAB — FERRITIN: FERRITIN: 2948 ng/mL — AB (ref 11–307)

## 2017-03-09 LAB — MRSA PCR SCREENING: MRSA BY PCR: NEGATIVE

## 2017-03-09 LAB — TSH: TSH: 1.415 u[IU]/mL (ref 0.350–4.500)

## 2017-03-09 LAB — BRAIN NATRIURETIC PEPTIDE: B NATRIURETIC PEPTIDE 5: 1202 pg/mL — AB (ref 0.0–100.0)

## 2017-03-09 MED ORDER — ACETAMINOPHEN 325 MG PO TABS
650.0000 mg | ORAL_TABLET | Freq: Four times a day (QID) | ORAL | Status: DC
Start: 2017-03-09 — End: 2017-03-12
  Administered 2017-03-09 – 2017-03-12 (×12): 650 mg via ORAL
  Filled 2017-03-09 (×12): qty 2

## 2017-03-09 MED ORDER — INSULIN ASPART 100 UNIT/ML ~~LOC~~ SOLN: 0.0000 [IU] | Freq: Four times a day (QID) | SUBCUTANEOUS | Status: DC

## 2017-03-09 MED ORDER — GABAPENTIN 300 MG PO CAPS
600.0000 mg | ORAL_CAPSULE | Freq: Every day | ORAL | Status: DC
  Administered 2017-03-09: 300 mg via ORAL
  Administered 2017-03-10 – 2017-03-11 (×2): 600 mg via ORAL
  Filled 2017-03-09: qty 6
  Filled 2017-03-09 (×2): qty 2

## 2017-03-09 MED ORDER — GABAPENTIN 300 MG PO CAPS
300.0000 mg | ORAL_CAPSULE | Freq: Every day | ORAL | Status: DC
  Administered 2017-03-09: 300 mg via ORAL
  Filled 2017-03-09: qty 1

## 2017-03-09 MED ORDER — HYDRALAZINE HCL 20 MG/ML IJ SOLN: 5.0000 mg | INTRAMUSCULAR | Status: DC | PRN

## 2017-03-09 MED ORDER — INSULIN ASPART 100 UNIT/ML ~~LOC~~ SOLN
0.0000 [IU] | Freq: Three times a day (TID) | SUBCUTANEOUS | Status: DC
  Administered 2017-03-09: 5 [IU] via SUBCUTANEOUS

## 2017-03-09 MED ORDER — INSULIN ASPART 100 UNIT/ML ~~LOC~~ SOLN
10.0000 [IU] | Freq: Once | SUBCUTANEOUS | Status: AC
  Administered 2017-03-09: 10 [IU] via SUBCUTANEOUS

## 2017-03-09 MED ORDER — INSULIN DETEMIR 100 UNIT/ML ~~LOC~~ SOLN
15.0000 [IU] | Freq: Every day | SUBCUTANEOUS | Status: DC
  Administered 2017-03-09: 15 [IU] via SUBCUTANEOUS
  Filled 2017-03-09 (×3): qty 0.15

## 2017-03-09 MED ORDER — INSULIN ASPART 100 UNIT/ML ~~LOC~~ SOLN
4.0000 [IU] | Freq: Three times a day (TID) | SUBCUTANEOUS | Status: DC
  Administered 2017-03-09: 4 [IU] via SUBCUTANEOUS

## 2017-03-09 MED ORDER — GABAPENTIN 300 MG PO CAPS: 600.0000 mg | ORAL_CAPSULE | Freq: Every day | ORAL | Status: DC

## 2017-03-09 NOTE — Consult Note (Addendum)
Referring Provider: Irwin Brakeman, MD Primary Care Physician:  [name], MD Primary Gastroenterologist:  Dr. Laural Golden  Reason for Consultation:    Anemia.  HPI:   Patient is 76 year old Caucasian female with multiple medical problems including diabetes mellitus, chronic kidney disease, CAD who was admitted to [facility] with hemoglobin of 4.8 g.she has received 3 units of PRBCs and is finishing up a fourth unit. She was brought to emergency room by her friend [name] yesterday for progressive weakness and slurred speech.Slurred speech has resolved with transfusion. Patient's GI history is as follows.  April 2018. Patient was admitted to [facility] with progressive weakness,lightheadednessand malaise.she was treated for CHF and diagnosed with non-STEMI. Hemoglobin on admission was 6.8 g. She was given 2 units of PRBCs.she was noted to have heme positive stool. She was discharged on aspirin and Plavix and outpatient evaluation was recommended.  May 2018. She was seen at [facility] carefor follow-up of CHF and non-STEMI on 10/04/2016. Her hemoglobin was 4.9 g and she was therefore hospitalized at [facility]. She did give history of melena and progressive weakness. She received 3 units of PRBCs. She underwent GI evaluation. EGD revealed gastritis but no lesion to account for GI bleed. Colonoscopy revealed 5 mm polyp at transverse colon but once again no bleeding lesions identified. Patient was advised to return for elective colonoscopy when she can be off Clopidogrel. He also had small bowel given capsule study and this was negative as well.  May/June 2018. Patient was admitted to [facility] on 10/25/2016 with hemoglobin of 3.8 g. She was on aspirin and clopidogrel. She received 4 units of PRBCs.she was seen by Dr. Gala Romney of GI service and no further workup was recommended.also treated for CHF. She was discharged on 10/30/2016 and advised not to take aspirin and clopidogrel.  July  2018: She received parenteral iron on 2 different occasions via oncology clinic.  She was seen at [facility] on 02/13/2017 and was doing well. Since she had an appointment to be seen in oncology clinic earlier this week no blood was drawn. Patient's appointment was apparently moved to different date.  Patient states she was begun on aspirin and clopidogrel about 2 weeks ago. She has not experienced melena or rectal bleeding hematuria or vaginal bleeding. Her appetite has been fair. She has not gained or lost any weight. She denies abdominal pain.he does not take other OTC NSAIDs.    Past Medical History:  Diagnosis Date  . History of iron deficiency anemia   . Arthritis   . Blood transfusion without reported diagnosis   . Left cataract extraction       . Chronic combined systolic and diastolic CHF (congestive heart failure) ([place])    a. 2D echo 08/2016 at [facility]: EF 50-55% with inferior wall HK, impaired LV filling, fair study.  . CKD (chronic kidney disease), stage III ([place])   . Diabetes ([place])   . Gastritis   . GERD (gastroesophageal reflux disease)   . Gout   . HTN (hypertension)   . Hyperlipidemia     11/08/2016    10/26/2016  . NSTEMI (non-ST elevated myocardial infarction) ([place])    a. Complex admission 08/2016 - with severe hyperglycemia, AKI on CKD, severe anemia down to Hgb 6.8, acute combined CHF, troponin of 8.5, cath deferred due to renal dysfunction.  . Thyroid disease     Past Surgical History:  Procedure Laterality Date  . COLONOSCOPY WITH PROPOFOL N/A 10/08/2016   Procedure: COLONOSCOPY WITH PROPOFOL;  Surgeon:  Ronnette Juniper, MD;  Location: [facility] ENDOSCOPY;  Service: Gastroenterology;  Laterality: N/A;  . ESOPHAGOGASTRODUODENOSCOPY N/A 10/06/2016   Procedure: ESOPHAGOGASTRODUODENOSCOPY (EGD);  Surgeon: Otis Brace, MD;  Location: [facility] ENDOSCOPY;  Service: Gastroenterology;  Laterality: N/A;  . GIVENS CAPSULE STUDY  10/08/2016   Procedure: GIVENS CAPSULE STUDY;  Surgeon: Ronnette Juniper,  MD;  Location: [place];  Service: Gastroenterology;;  . Freda Munro CAPSULE STUDY N/A 10/29/2016   Procedure: GIVENS CAPSULE STUDY;  Surgeon: Danie Binder, MD;  Location: [name];  Service: Endoscopy;  Laterality: N/A;  . ORIF HUMERUS FRACTURE Left 02/07/2015   Procedure: OPEN REDUCTION INTERNAL FIXATION (ORIF) PROXIMAL HUMERUS FRACTURE;  Surgeon: Marybelle Killings, MD;  Location: [place];  Service: Orthopedics;  Laterality: Left;  . THYROID SURGERY    . TOTAL HIP ARTHROPLASTY Left 02/07/2015   Procedure: TOTAL HIP ARTHROPLASTY ANTERIOR APPROACH ;  Surgeon: Marybelle Killings, MD;  Location: [place];  Service: Orthopedics;  Laterality: Left;  . WRIST SURGERY Left     Prior to Admission medications   Medication Sig Start Date End Date Taking? Authorizing Provider  amLODipine (NORVASC) 10 MG tablet Take 1 tablet (10 mg total) by mouth daily. 01/25/17  Yes [name], MD  clopidogrel (PLAVIX) 75 MG tablet Take 1 tablet (75 mg total) by mouth daily. 01/29/17  Yes BranchAlphonse Guild, MD  ferrous sulfate 325 (65 FE) MG tablet Take 325 mg by mouth daily with breakfast.   Yes [provider]  furosemide (LASIX) 20 MG tablet Take 20 mg by mouth as needed. Only has to take it when she gains 2 lbs 09/20/16  Yes [provider]  gabapentin (NEURONTIN) 300 MG capsule Take 2 capsules by mouth at bedtime.  01/04/15  Yes [provider]  Insulin Glargine (TOUJEO MAX SOLOSTAR) 300 UNIT/ML SOPN Inject 20 Units into the skin at bedtime. 12/13/16  Yes Nida, Marella Chimes, MD  Insulin Pen Needle (B-D ULTRAFINE III SHORT PEN) 31G X 8 MM MISC 1 each by Does not apply route as directed. 12/13/16  Yes Nida, Marella Chimes, MD  Insulin Pen Needle (PEN NEEDLES) 32G X 6 MM MISC 1 each by Does not apply route at bedtime. 02/05/17  Yes Nida, Marella Chimes, MD  levothyroxine (SYNTHROID, LEVOTHROID) 50 MCG tablet Take 1 tablet (50 mcg total) by mouth daily before breakfast. 02/14/17  Yes Nida, Marella Chimes, MD  metoprolol tartrate (LOPRESSOR) 25 MG tablet Take 12.5 mg by mouth 2 (two) times daily.   Yes [provider]  Multiple Vitamin (MULTIVITAMIN WITH MINERALS) TABS tablet Take 1 tablet by mouth daily.   Yes [provider]  ondansetron (ZOFRAN) 4 MG tablet Take 1 tablet (4 mg total) by mouth every 8 (eight) hours as needed for nausea or vomiting. 10/25/16  Yes [name], MD  pantoprazole (PROTONIX) 40 MG tablet Take 1 tablet (40 mg total) by mouth daily. 30 minutes before breakfast 02/13/17  Yes Annitta Needs, NP  ULORIC 40 MG tablet Take 1 tablet by mouth daily. 12/21/14  Yes [provider]  hydrALAZINE (APRESOLINE) 25 MG tablet Take 1 tablet (25 mg total) by mouth every 8 (eight) hours as needed. 02/18/17   [name], MD    Current Facility-Administered Medications  Medication Dose Route Frequency Provider Last Rate Last Dose  . acetaminophen (TYLENOL) tablet 650 mg  650 mg Oral Q6H Johnson, Clanford L, MD   650 mg at 03/09/17 1127  . febuxostat (ULORIC) tablet 40  mg  40 mg Oral Daily Ivor Costa, MD   40 mg at 03/09/17 1000  . ferrous sulfate tablet 325 mg  325 mg Oral Q breakfast Ivor Costa, MD   325 mg at 03/09/17 0756  . gabapentin (NEURONTIN) capsule 300 mg  300 mg Oral QHS Johnson, Clanford L, MD      . hydrALAZINE (APRESOLINE) injection 5 mg  5 mg Intravenous Q4H PRN Johnson, Clanford L, MD      . insulin aspart (novoLOG) injection 0-9 Units  0-9 Units Subcutaneous Q6H Johnson, Clanford L, MD      . insulin detemir (LEVEMIR) injection 15 Units  15 Units Subcutaneous Daily Johnson, Clanford L, MD   15 Units at 03/09/17 1000  . levothyroxine (SYNTHROID, LEVOTHROID) tablet 50 mcg  50 mcg Oral QAC breakfast Ivor Costa, MD   50 mcg at 03/09/17 0756  . metoprolol tartrate (LOPRESSOR) tablet 12.5 mg  12.5 mg Oral BID Ivor Costa, MD   12.5 mg at 03/09/17 1000  . morphine 2 MG/ML injection 1 mg  1 mg Intravenous Q3H PRN Ivor Costa, MD      .  multivitamin with minerals tablet 1 tablet  1 tablet Oral Daily Ivor Costa, MD   1 tablet at 03/09/17 1000  . nitroGLYCERIN (NITROSTAT) SL tablet 0.4 mg  0.4 mg Sublingual Q5 min PRN Ivor Costa, MD      . ondansetron [facility]) injection 4 mg  4 mg Intravenous Q8H PRN Ivor Costa, MD      . pantoprazole (PROTONIX) 80 mg in sodium chloride 0.9 % 250 mL (0.32 mg/mL) infusion  8 mg/hr Intravenous Continuous Ivor Costa, MD 25 mL/hr at 03/09/17 1205 8 mg/hr at 03/09/17 1205  . [START ON 03/12/2017] pantoprazole (PROTONIX) injection 40 mg  40 mg Intravenous Q12H Ivor Costa, MD      . zolpidem (AMBIEN) tablet 5 mg  5 mg Oral QHS PRN Ivor Costa, MD        Allergies as of 03/08/2017 - Review Complete 03/08/2017  Allergen Reaction Noted  . Insulin glargine Swelling 09/18/2016  . Statins Other (See Comments) 09/18/2016    Family History  Problem Relation Age of Onset  . Diabetes Mother   . Asthma Mother   . Early death Mother 56       Pneumonia  . Early death Father        Killed at rode[place]  . CAD Neg Hx   . GI Bleed Neg Hx     Social History   Social History  . Marital status: Widowed    Spouse name: [name]  . Number of children: 2  . Years of education: 12   Occupational History  . retired    Social History Main Topics  . Smoking status: Never Smoker  . Smokeless tobacco: Never Used  . Alcohol use No  . Drug use: No  . Sexual activity: Not Currently   Other Topics Concern  . Not on file   Social History Narrative   Lives with significant other/partner [name]   He is her caregiver   Moved from [facility]    Review of Systems: See HPI, otherwise normal ROS  Physical Exam: Temp:  [97.8 F (36.6 C)-101.6 F (38.7 C)] 97.8 F (36.6 C) (10/13 1214) Pulse Rate:  [69-97] 69 (10/13 1214) Resp:  [13-27] 15 (10/13 1214) BP: (110-149)/(44-67) 122/48 (10/13 1214) SpO2:  [86 %-100 %] 100 % (10/13 1214) Weight:  [176 lb 9.4 oz (80.1 kg)-180 lb (81.6 kg)] 177  lb 7.5 oz (80.5 kg) (10/13  0415) Last BM Date: 03/08/17  Patient appears to be comfortable sitting in a reclining chair. She appears pale. Conjunctiva is also pale and sclerae non-icteric. Oropharyngeal mucosa is unremarkable. She has complete upper and partial lower plate. No neck masses or thyromegaly noted. Cardiac exam with regular rhythm normal S1 and S2. No murmur or gallop noted. Lungs are clear to auscultation. Abdomen is full. Bowel sounds are normal. On palpation abdomen is soft and nontender without organomegaly or masses. No peripheral edema or clubbing noted.   Lab Results:   Recent Labs  03/08/17 1527 03/08/17 2321 03/09/17 0824  WBC 14.1* 17.5* 16.3*  HGB 4.8* 6.3* 8.4*  HCT 14.6* 19.5* 25.3*  PLT 302 322 252   BMET  Recent Labs  03/08/17 1527 03/09/17 0824  NA 138 140  K 4.2 4.0  CL 104 107  CO2 21* 21*  GLUCOSE 318* 320*  BUN 96* 91*  CREATININE 3.02* 2.78*  CALCIUM 8.4* 8.0*   LFT  Recent Labs  03/08/17 1527  PROT 5.8*  ALBUMIN 3.0*  AST 26  ALT 11*  ALKPHOS 56  BILITOT 0.3   PT/INR  Recent Labs  03/08/17 1527  LABPROT 15.5*  INR 1.24   Hepatitis Panel No results for input(s): HEPBSAG, HCVAB, HEPAIGM, HEPBIGM in the last 72 hours.  Studies/Results: Dg Chest 2 View  Result Date: 03/08/2017 CLINICAL DATA:  Weakness, onset of generalized weakness 48 hours ago, some slurred speech 24 hours ago intermittently, history CHF, diabetes mellitus, hypertension, NSTEMI, chronic kidney disease EXAM: CHEST  2 VIEW COMPARISON:  10/27/2016 FINDINGS: Enlargement of cardiac silhouette. Mediastinal contours and pulmonary vascularity normal. Lungs clear. No acute infiltrate, pleural effusion or pneumothorax. Bones demineralized with prior proximal LEFT humeral ORIF. IMPRESSION: Enlargement of cardiac silhouette. No acute abnormalities. Electronically Signed   By: Lavonia Dana M.D.   On: 03/08/2017 17:44   Ct Head Wo Contrast  Result Date: 03/08/2017 CLINICAL DATA:  Slurred  speech, weakness. EXAM: CT HEAD WITHOUT CONTRAST TECHNIQUE: Contiguous axial images were obtained from the base of the skull through the vertex without intravenous contrast. COMPARISON:  CT scan of Oct 25, 2016. FINDINGS: Brain: Mild chronic ischemic white matter disease. No mass effect or midline shift is noted. Ventricular size is within normal limits. There is no evidence of mass lesion, hemorrhage or acute infarction. Vascular: No hyperdense vessel or unexpected calcification. Skull: Normal. Negative for fracture or focal lesion. Sinuses/Orbits: No acute finding. Other: None. IMPRESSION: Mild chronic ischemic white matter disease. No acute intracranial abnormality seen. Electronically Signed   By: Marijo Conception, M.D.   On: 03/08/2017 17:20   Mr Brain Wo Contrast  Result Date: 03/09/2017 CLINICAL DATA:  TIA.  Patient admitted with slurred speech EXAM: MRI HEAD WITHOUT CONTRAST TECHNIQUE: Multiplanar, multiecho pulse sequences of the brain and surrounding structures were obtained without intravenous contrast. COMPARISON:  Head CT from yesterday FINDINGS: Brain: No acute infarction, hemorrhage, hydrocephalus, extra-axial collection or mass lesion. Overall mild chronic microvascular ischemic gliosis in the deep cerebral white matter. 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 Age normal brain volume. Vascular: Major flow voids are preserved Skull and upper cervical spine: No evidence of marrow lesion. Sinuses/Orbits: Left cataract resection.  No acute finding IMPRESSION: 1. No acute finding including infarct. 2. Mild chronic small vessel ischemia in the cerebral white matter. 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 Electronically Signed   By: Neva Seat.D.  On: 03/09/2017 10:09    Assessment;  Patient is 76 year old Caucasian female with multiple medical problems who also has history of iron deficiency anemia and GI bleed(heme positive stools on 3 different  occasions) who presented to emergency room with progressive weakness and slurred speech and found to have hemoglobin of  4.8 g. Hgb this morning was 8.4 g after 3 units and she is receiving fourth unit of PRBCs. Slurred speech has resolved and she feels much better. All in all she has received 9 units of PRBCs since April 2018 not including 4 units from this admission. She has undergone EGD, colonoscopy and small bowel given capsule study(twice)without definite source of GI bleed. Suspect her anemia is multifactorial and iron deficiency is one reason. She may also have bone marrow dysfunction. If indeed her iron stores are depleted which I doubt very much would consider repeating small bowel given capsule study. Given that she has recurrent issues with profound anemia she needs to have her H&H checked on regular basis so that we can prevent her from crashing.  Elevated troponin levels felt to be due to demand ischemia rather than acute injury.  Recommendations;  Modifier carb diet. Serum iron TIBC and ferritin on serum prior to transfusion.I have talked with lab personnel and they have enough serum to do the tests. Hemoccults 3.   LOS: 1 day   Joycelynn Fritsche  03/09/2017, 1:27 PM

## 2017-03-09 NOTE — Progress Notes (Addendum)
PROGRESS NOTE   [name]  OMV:[id number]  DOB: Aug 06, 1940  DOA: 03/08/2017 PCP: [name], MD  Brief Admission Hx: [name] is a 76 y.o. female with medical history significant of GI bleeding, gastritis, hypertension, diabetes mellitus, GERD, hypothyroidism, gout, CAD, iron deficiency anemia, CKD-4, CAD, NSTEMI, who presents with generalized weakness, slurred speech.  pt was found to have hemoglobin dropped from 10.3 on 11/23/16 to 4.8.  She was admitted with severe anemia.    MDM/Assessment & Plan:   1. Severe symptomatic anemia - Pt is ordered to have 4 units PRBC transfused.  Follow Hg closely.  Request GI consult.  FOBT pending.  Pt has had extensive GI work up with past admissions for this recurrent problem.  She was recently restarted on plavix 2 weeks ago.  IV protonix.  Pt is followed by hematology and gets iron transfusions.    2. Mental Status changes - slurred speech, CT head negative for acute findings.  MRI brain pending.    3. Elevated troponin - spoke with cardiology who recommended no intervention at this time, suspect demand ischemia secondary to severe anemia.  4. Stage 4 CKD - avoid nephrotoxins as much as possible and renally dose medications.  Follow renal function closely.  5. Diabetes mellitus type 2 - Has been well controlled on insulin. Monitor and adjust therapy as needed.  6. Hypothyroidism - resume home levothyroxine. 7. Hypertension - holding home meds, IV hydralazine ordered as needed.  8. Fever - will initiate infection work up.  Check urinalysis, culture, blood culture x 2 ordered, chest x ray.  May be reaction to blood transfusion. Tylenol ordered.   DVT prophylaxis: SCDs Code Status: FULL  Family Communication:  Disposition Plan: TBD  Subjective: Pt says she feels a lot better after having blood.  Still has 1 more unit to infuse of PRBC.  Pt says she had just restarted the plavix 2 weeks ago.   Objective: Vitals:   03/09/17  0430 03/09/17 0500 03/09/17 0600 03/09/17 0721  BP: (!) 132/58 122/67 (!) 119/46 (!) 112/51  Pulse: 92 89 85   Resp: (!) 22 (!) 21 18   Temp: (!) 101.6 F (38.7 C)   98.8 F (37.1 C)  TempSrc: Oral   Oral  SpO2: 93% 93% 93%   Weight:      Height:        Intake/Output Summary (Last 24 hours) at 03/09/17 0750 Last data filed at 03/09/17 6283  Gross per 24 hour  Intake          2071.17 ml  Output                0 ml  Net          2071.17 ml   Filed Weights   03/08/17 1508 03/08/17 2119 03/09/17 0415  Weight: 81.6 kg (180 lb) 80.1 kg (176 lb 9.4 oz) 80.5 kg (177 lb 7.5 oz)     REVIEW OF SYSTEMS  As per history otherwise all reviewed and reported negative  Exam:  General exam: awake, alert, NAD. Cooperative. Dry MM.  Respiratory system: Clear. No increased work of breathing. Cardiovascular system: S1 & S2 heard.  Gastrointestinal system: Abdomen is nondistended, soft and nontender. Normal bowel sounds heard. Central nervous system: Alert and oriented. No focal neurological deficits. Extremities: no CCE.  Data Reviewed: Basic Metabolic Panel:  Recent Labs Lab 03/08/17 1527  NA 138  K 4.2  CL 104  CO2 21*  GLUCOSE  318*  BUN 96*  CREATININE 3.02*  CALCIUM 8.4*   Liver Function Tests:  Recent Labs Lab 03/08/17 1527  AST 26  ALT 11*  ALKPHOS 56  BILITOT 0.3  PROT 5.8*  ALBUMIN 3.0*   No results for input(s): LIPASE, AMYLASE in the last 168 hours. No results for input(s): AMMONIA in the last 168 hours. CBC:  Recent Labs Lab 03/08/17 1527 03/08/17 2321  WBC 14.1* 17.5*  NEUTROABS 11.9*  --   HGB 4.8* 6.3*  HCT 14.6* 19.5*  MCV 94.8 93.3  PLT 302 322   Cardiac Enzymes:  Recent Labs Lab 03/08/17 1527 03/08/17 2321  TROPONINI 3.97* 3.06*   CBG (last 3)   Recent Labs  03/08/17 1515 03/08/17 2208 03/09/17 0012  GLUCAP 305* 289* 263*   Recent Results (from the past 240 hour(s))  MRSA PCR Screening     Status: None   Collection Time:  03/08/17  9:20 PM  Result Value Ref Range Status   MRSA by PCR NEGATIVE NEGATIVE Final    Comment:        The GeneXpert MRSA Assay (FDA approved for NASAL specimens only), is one component of a comprehensive MRSA colonization surveillance program. It is not intended to diagnose MRSA infection nor to guide or monitor treatment for MRSA infections.     Studies: Dg Chest 2 View  Result Date: 03/08/2017 CLINICAL DATA:  Weakness, onset of generalized weakness 48 hours ago, some slurred speech 24 hours ago intermittently, history CHF, diabetes mellitus, hypertension, NSTEMI, chronic kidney disease EXAM: CHEST  2 VIEW COMPARISON:  10/27/2016 FINDINGS: Enlargement of cardiac silhouette. Mediastinal contours and pulmonary vascularity normal. Lungs clear. No acute infiltrate, pleural effusion or pneumothorax. Bones demineralized with prior proximal LEFT humeral ORIF. IMPRESSION: Enlargement of cardiac silhouette. No acute abnormalities. Electronically Signed   By: Lavonia Dana M.D.   On: 03/08/2017 17:44   Ct Head Wo Contrast  Result Date: 03/08/2017 CLINICAL DATA:  Slurred speech, weakness. EXAM: CT HEAD WITHOUT CONTRAST TECHNIQUE: Contiguous axial images were obtained from the base of the skull through the vertex without intravenous contrast. COMPARISON:  CT scan of Oct 25, 2016. FINDINGS: Brain: Mild chronic ischemic white matter disease. No mass effect or midline shift is noted. Ventricular size is within normal limits. There is no evidence of mass lesion, hemorrhage or acute infarction. Vascular: No hyperdense vessel or unexpected calcification. Skull: Normal. Negative for fracture or focal lesion. Sinuses/Orbits: No acute finding. Other: None. IMPRESSION: Mild chronic ischemic white matter disease. No acute intracranial abnormality seen. Electronically Signed   By: Marijo Conception, M.D.   On: 03/08/2017 17:20   Scheduled Meds: . febuxostat  40 mg Oral Daily  . ferrous sulfate  325 mg Oral Q  breakfast  . gabapentin  600 mg Oral QHS  . insulin aspart  0-5 Units Subcutaneous QHS  . insulin aspart  0-9 Units Subcutaneous TID WC  . levothyroxine  50 mcg Oral QAC breakfast  . metoprolol tartrate  12.5 mg Oral BID  . multivitamin with minerals  1 tablet Oral Daily  . [START ON 03/12/2017] pantoprazole  40 mg Intravenous Q12H   Continuous Infusions: . pantoprozole (PROTONIX) infusion 8 mg/hr (03/08/17 2208)    Principal Problem:   Symptomatic anemia Active Problems:   Diabetes mellitus with stage 4 chronic kidney disease (HCC)   Gout   Chronic kidney disease, stage IV (severe) (HCC)   Elevated troponin   Iron deficiency anemia   Essential hypertension, benign  Hypothyroidism   Slurred speech  Critical Care Time spent: 33 mins  Irwin Brakeman, MD, FAAFP [facility] Pager [phone]  If 7PM-7AM, please contact night-coverage [email or web address] Password [email or web address] 03/09/2017, 7:50 AM    LOS: 1 day

## 2017-03-09 NOTE — Progress Notes (Signed)
*  PRELIMINARY RESULTS* Echocardiogram 2D Echocardiogram has been performed.  Leavy Cella 03/09/2017, 9:35 AM

## 2017-03-10 ENCOUNTER — Encounter (HOSPITAL_COMMUNITY): Payer: Self-pay | Admitting: Family Medicine

## 2017-03-10 ENCOUNTER — Inpatient Hospital Stay (HOSPITAL_COMMUNITY): Payer: Medicare HMO

## 2017-03-10 DIAGNOSIS — I10 Essential (primary) hypertension: Secondary | ICD-10-CM

## 2017-03-10 LAB — GLUCOSE, CAPILLARY
GLUCOSE-CAPILLARY: 297 mg/dL — AB (ref 65–99)
Glucose-Capillary: 194 mg/dL — ABNORMAL HIGH (ref 65–99)
Glucose-Capillary: 212 mg/dL — ABNORMAL HIGH (ref 65–99)
Glucose-Capillary: 185 mg/dL — ABNORMAL HIGH (ref 65–99)

## 2017-03-10 LAB — COMPREHENSIVE METABOLIC PANEL
ALBUMIN: 2.4 g/dL — AB (ref 3.5–5.0)
ALK PHOS: 43 U/L (ref 38–126)
ALT: 12 U/L — AB (ref 14–54)
AST: 14 U/L — AB (ref 15–41)
Anion gap: 9 (ref 5–15)
BUN: 91 mg/dL — AB (ref 6–20)
CHLORIDE: 110 mmol/L (ref 101–111)
CO2: 22 mmol/L (ref 22–32)
CREATININE: 2.46 mg/dL — AB (ref 0.44–1.00)
Calcium: 7.9 mg/dL — ABNORMAL LOW (ref 8.9–10.3)
GFR calc Af Amer: 21 mL/min — ABNORMAL LOW (ref >60)
GFR calc non Af Amer: 18 mL/min — ABNORMAL LOW (ref >60)
GLUCOSE: 171 mg/dL — AB (ref 65–99)
Potassium: 3.8 mmol/L (ref 3.5–5.1)
SODIUM: 141 mmol/L (ref 135–145)
Total Bilirubin: 0.5 mg/dL (ref 0.3–1.2)
Total Protein: 4.9 g/dL — ABNORMAL LOW (ref 6.5–8.1)

## 2017-03-10 LAB — CBC
HCT: 24.3 % — ABNORMAL LOW (ref 36.0–46.0)
HEMOGLOBIN: 8.1 g/dL — AB (ref 12.0–15.0)
MCH: 30.3 pg (ref 26.0–34.0)
MCHC: 33.3 g/dL (ref 30.0–36.0)
MCV: 91 fL (ref 78.0–100.0)
PLATELETS: 214 10*3/uL (ref 150–400)
RBC: 2.67 MIL/uL — AB (ref 3.87–5.11)
RDW: 17 % — ABNORMAL HIGH (ref 11.5–15.5)
WBC: 10.9 10*3/uL — AB (ref 4.0–10.5)

## 2017-03-10 MED ORDER — INSULIN ASPART 100 UNIT/ML ~~LOC~~ SOLN
5.0000 [IU] | Freq: Three times a day (TID) | SUBCUTANEOUS | Status: DC
  Administered 2017-03-10 (×2): 5 [IU] via SUBCUTANEOUS

## 2017-03-10 MED ORDER — INSULIN ASPART 100 UNIT/ML ~~LOC~~ SOLN
0.0000 [IU] | Freq: Three times a day (TID) | SUBCUTANEOUS | Status: DC
  Administered 2017-03-10: 5 [IU] via SUBCUTANEOUS
  Administered 2017-03-10: 3 [IU] via SUBCUTANEOUS
  Administered 2017-03-10: 8 [IU] via SUBCUTANEOUS
  Administered 2017-03-11: 5 [IU] via SUBCUTANEOUS
  Administered 2017-03-11: 2 [IU] via SUBCUTANEOUS
  Administered 2017-03-11: 11 [IU] via SUBCUTANEOUS
  Administered 2017-03-12: 3 [IU] via SUBCUTANEOUS

## 2017-03-10 MED ORDER — INSULIN DETEMIR 100 UNIT/ML ~~LOC~~ SOLN
20.0000 [IU] | Freq: Every day | SUBCUTANEOUS | Status: DC
  Administered 2017-03-10: 20 [IU] via SUBCUTANEOUS
  Filled 2017-03-10 (×2): qty 0.2

## 2017-03-10 MED ORDER — SENNOSIDES-DOCUSATE SODIUM 8.6-50 MG PO TABS
2.0000 | ORAL_TABLET | Freq: Two times a day (BID) | ORAL | Status: DC
  Administered 2017-03-10 – 2017-03-12 (×5): 2 via ORAL
  Filled 2017-03-10 (×5): qty 2

## 2017-03-10 MED ORDER — PANTOPRAZOLE SODIUM 40 MG PO TBEC
40.0000 mg | DELAYED_RELEASE_TABLET | Freq: Every day | ORAL | Status: DC
  Administered 2017-03-10 – 2017-03-12 (×3): 40 mg via ORAL
  Filled 2017-03-10 (×3): qty 1

## 2017-03-10 MED ORDER — INSULIN ASPART 100 UNIT/ML ~~LOC~~ SOLN: 0.0000 [IU] | Freq: Every day | SUBCUTANEOUS | Status: DC

## 2017-03-10 MED ORDER — INSULIN ASPART 100 UNIT/ML ~~LOC~~ SOLN
6.0000 [IU] | Freq: Three times a day (TID) | SUBCUTANEOUS | Status: DC
  Administered 2017-03-10 – 2017-03-11 (×2): 6 [IU] via SUBCUTANEOUS

## 2017-03-10 NOTE — Plan of Care (Signed)
Problem: Tissue Perfusion: Goal: Risk factors for ineffective tissue perfusion will decrease Outcome: Progressing Pt wearing scds for dvt prevention   

## 2017-03-10 NOTE — Progress Notes (Addendum)
PROGRESS NOTE   [name]  QBH:[id number]  DOB: Jul 04, 1940  DOA: 03/08/2017 PCP: Briana Everts, MD  Brief Admission Hx: [name] is a 76 y.o. female with medical history significant of GI bleeding, gastritis, hypertension, diabetes mellitus, GERD, hypothyroidism, gout, CAD, iron deficiency anemia, CKD-4, CAD, NSTEMI, who presents with generalized weakness, slurred speech.  [name] was found to have hemoglobin dropped from 10.3 on 11/23/16 to 4.8.  She was admitted with severe anemia.    MDM/Assessment & Plan:   1. Severe symptomatic anemia - [name] is ordered to have 4 units PRBC transfused.  Follow Hg closely.  Request GI consult.  FOBT pending.  [name] has had extensive GI work up with past admissions for this recurrent problem.  She was recently restarted on plavix 2 weeks ago.  IV protonix.  [name] is followed by hematology and gets iron transfusions. Transfer to telemetry.   Hemoccults pending. 2. Mental Status changes - slurred speech completely resolved, CT head negative for acute findings.  MRI brain no acute findings.    3. Elevated troponin - spoke with cardiology who recommended no intervention at this time, suspect demand ischemia secondary to severe anemia. Trop trending down. [name] having no symptoms. Echo: EF 60-65% no regional wall abnormalities seen.  4. Stage 4 CKD - avoid nephrotoxins as much as possible and renally dose medications.  Follow renal function closely.  5. Diabetes mellitus type 2 - Adjusting therapy and increasing supplemental coverage.   6. Hypothyroidism - resume home levothyroxine. 7. Hypertension - holding home meds, IV hydralazine ordered as needed.  8. Fever - suspect was related to blood transfusions, no recurrence. No infection found.Tylenol ordered as needed.   DVT prophylaxis: SCDs Code Status: FULL  Family Communication:  Disposition Plan: TBD  Subjective: [name] says she feels fine.  She has no complaints.    Objective: Vitals:   03/10/17 0500  03/10/17 0600 03/10/17 0700 03/10/17 0745  BP: (!) 123/48 (!) 122/56 (!) 106/56   Pulse: 73 66 72 73  Resp: 16 15 15 19   Temp:    98.7 F (37.1 C)  TempSrc:    Oral  SpO2: 97% 97% 96% 96%  Weight: 82.4 kg (181 lb 10.5 oz)     Height:        Intake/Output Summary (Last 24 hours) at 03/10/17 0748 Last data filed at 03/10/17 0640  Gross per 24 hour  Intake          1676.67 ml  Output              750 ml  Net           926.67 ml   Filed Weights   03/08/17 2119 03/09/17 0415 03/10/17 0500  Weight: 80.1 kg (176 lb 9.4 oz) 80.5 kg (177 lb 7.5 oz) 82.4 kg (181 lb 10.5 oz)     REVIEW OF SYSTEMS  As per history otherwise all reviewed and reported negative  Exam:  General exam: awake, alert, NAD. Cooperative. Dry MM.  Respiratory system: Clear. No increased work of breathing. Cardiovascular system: S1 & S2 heard.  Gastrointestinal system: Abdomen is nondistended, soft and nontender. Normal bowel sounds heard. Central nervous system: Alert and oriented. No focal neurological deficits. Extremities: no CCE.  Data Reviewed: Basic Metabolic Panel:  Recent Labs Lab 03/08/17 1527 03/09/17 0824 03/10/17 0450  NA 138 140 141  K 4.2 4.0 3.8  CL 104 107 110  CO2 21* 21* 22  GLUCOSE 318* 320* 171*  BUN 96* 91* 91*  CREATININE 3.02* 2.78* 2.46*  CALCIUM 8.4* 8.0* 7.9*   Liver Function Tests:  Recent Labs Lab 03/08/17 1527 03/10/17 0450  AST 26 14*  ALT 11* 12*  ALKPHOS 56 43  BILITOT 0.3 0.5  PROT 5.8* 4.9*  ALBUMIN 3.0* 2.4*   No results for input(s): LIPASE, AMYLASE in the last 168 hours. No results for input(s): AMMONIA in the last 168 hours. CBC:  Recent Labs Lab 03/08/17 1527 03/08/17 2321 03/09/17 0824 03/10/17 0450  WBC 14.1* 17.5* 16.3* 10.9*  NEUTROABS 11.9*  --   --   --   HGB 4.8* 6.3* 8.4* 8.1*  HCT 14.6* 19.5* 25.3* 24.3*  MCV 94.8 93.3 91.3 91.0  PLT 302 322 252 214   Cardiac Enzymes:  Recent Labs Lab 03/08/17 1527 03/08/17 2321  03/09/17 0823 03/09/17 2026  TROPONINI 3.97* 3.06* 3.58* 2.06*   CBG (last 3)   Recent Labs  03/09/17 1118 03/09/17 2129 03/10/17 0744  GLUCAP 319* 352* 194*   Recent Results (from the past 240 hour(s))  MRSA PCR Screening     Status: None   Collection Time: 03/08/17  9:20 PM  Result Value Ref Range Status   MRSA by PCR NEGATIVE NEGATIVE Final    Comment:        The GeneXpert MRSA Assay (FDA approved for NASAL specimens only), is one component of a comprehensive MRSA colonization surveillance program. It is not intended to diagnose MRSA infection nor to guide or monitor treatment for MRSA infections.   Culture, blood (Routine X 2) w Reflex to ID Panel     Status: None (Preliminary result)   Collection Time: 03/09/17  8:23 AM  Result Value Ref Range Status   Specimen Description LEFT ANTECUBITAL  Final   Special Requests   Final    BOTTLES DRAWN AEROBIC AND ANAEROBIC Blood Culture adequate volume   Culture NO GROWTH < 12 HOURS  Final   Report Status PENDING  Incomplete  Culture, blood (Routine X 2) w Reflex to ID Panel     Status: None (Preliminary result)   Collection Time: 03/09/17  8:23 AM  Result Value Ref Range Status   Specimen Description BLOOD LEFT FOREARM  Final   Special Requests   Final    BOTTLES DRAWN AEROBIC AND ANAEROBIC Blood Culture adequate volume   Culture NO GROWTH < 12 HOURS  Final   Report Status PENDING  Incomplete    Studies: Dg Chest 2 View  Result Date: 03/08/2017 CLINICAL DATA:  Weakness, onset of generalized weakness 48 hours ago, some slurred speech 24 hours ago intermittently, history CHF, diabetes mellitus, hypertension, NSTEMI, chronic kidney disease EXAM: CHEST  2 VIEW COMPARISON:  10/27/2016 FINDINGS: Enlargement of cardiac silhouette. Mediastinal contours and pulmonary vascularity normal. Lungs clear. No acute infiltrate, pleural effusion or pneumothorax. Bones demineralized with prior proximal LEFT humeral ORIF. IMPRESSION:  Enlargement of cardiac silhouette. No acute abnormalities. Electronically Signed   By: Lavonia Dana M.D.   On: 03/08/2017 17:44   Ct Head Wo Contrast  Result Date: 03/08/2017 CLINICAL DATA:  Slurred speech, weakness. EXAM: CT HEAD WITHOUT CONTRAST TECHNIQUE: Contiguous axial images were obtained from the base of the skull through the vertex without intravenous contrast. COMPARISON:  CT scan of Oct 25, 2016. FINDINGS: Brain: Mild chronic ischemic white matter disease. No mass effect or midline shift is noted. Ventricular size is within normal limits. There is no evidence of mass lesion, hemorrhage or acute infarction. Vascular: No hyperdense vessel  or unexpected calcification. Skull: Normal. Negative for fracture or focal lesion. Sinuses/Orbits: No acute finding. Other: None. IMPRESSION: Mild chronic ischemic white matter disease. No acute intracranial abnormality seen. Electronically Signed   By: Marijo Conception, M.D.   On: 03/08/2017 17:20   Mr Brain Wo Contrast  Result Date: 03/09/2017 CLINICAL DATA:  TIA.  Patient admitted with slurred speech EXAM: MRI HEAD WITHOUT CONTRAST TECHNIQUE: Multiplanar, multiecho pulse sequences of the brain and surrounding structures were obtained without intravenous contrast. COMPARISON:  Head CT from yesterday FINDINGS: Brain: No acute infarction, hemorrhage, hydrocephalus, extra-axial collection or mass lesion. Overall mild chronic microvascular ischemic gliosis in the deep cerebral white matter. 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 Age normal brain volume. Vascular: Major flow voids are preserved Skull and upper cervical spine: No evidence of marrow lesion. Sinuses/Orbits: Left cataract resection.  No acute finding IMPRESSION: 1. No acute finding including infarct. 2. Mild chronic small vessel ischemia in the cerebral white matter. 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 Electronically Signed   By: Monte Fantasia M.D.   On:  03/09/2017 10:09   Dg Chest Port 1 View  Result Date: 03/10/2017 CLINICAL DATA:  Fever, leukocytosis EXAM: PORTABLE CHEST 1 VIEW COMPARISON:  03/08/2017 FINDINGS: Pulmonary vascular congestion. No frank interstitial edema. No pleural effusion or pneumothorax. Cardiomegaly. Postsurgical changes involving the left proximal humerus, incompletely visualized. IMPRESSION: Cardiomegaly with pulmonary vascular congestion. No frank interstitial edema. Electronically Signed   By: Julian Hy M.D.   On: 03/10/2017 07:38   Scheduled Meds: . acetaminophen  650 mg Oral Q6H  . febuxostat  40 mg Oral Daily  . ferrous sulfate  325 mg Oral Q breakfast  . gabapentin  600 mg Oral QHS  . insulin aspart  0-15 Units Subcutaneous TID WC  . insulin aspart  0-5 Units Subcutaneous QHS  . insulin aspart  5 Units Subcutaneous TID WC  . insulin detemir  20 Units Subcutaneous Daily  . levothyroxine  50 mcg Oral QAC breakfast  . metoprolol tartrate  12.5 mg Oral BID  . multivitamin with minerals  1 tablet Oral Daily  . pantoprazole  40 mg Oral Q0600   Continuous Infusions:  Principal Problem:   Symptomatic anemia Active Problems:   Diabetes mellitus with stage 4 chronic kidney disease (HCC)   Gout   Chronic kidney disease, stage IV (severe) (HCC)   Elevated troponin   Iron deficiency anemia   Essential hypertension, benign   Hypothyroidism   Slurred speech   Acute on chronic anemia  Critical Care Time spent: 31 mins  Irwin Brakeman, MD, FAAFP [facility] Pager [phone]  If 7PM-7AM, please contact night-coverage [email or web address] Password [email or web address] 03/10/2017, 7:48 AM    LOS: 2 days

## 2017-03-10 NOTE — Progress Notes (Signed)
  Subjective:  Patient has no complaints. She denies chest pain shortness of breath or abdominal pain. She has not had a BM since admission. Her appetite is good.  Objective: Blood pressure (!) 124/47, pulse 75, temperature 98.4 F (36.9 C), temperature source Oral, resp. rate 14, height 5\' 4"  (1.626 m), weight 181 lb 10.5 oz (82.4 kg), SpO2 94 %. Patient is alert and in no acute distress. She remains pale. Abdomen is full butt soft and nontender ithout organomegaly or masses.    Labs/studies Results:   Recent Labs  03/08/17 2321 03/09/17 0824 03/10/17 0450  WBC 17.5* 16.3* 10.9*  HGB 6.3* 8.4* 8.1*  HCT 19.5* 25.3* 24.3*  PLT 322 252 214    BMET   Recent Labs  03/08/17 1527 03/09/17 0824 03/10/17 0450  NA 138 140 141  K 4.2 4.0 3.8  CL 104 107 110  CO2 21* 21* 22  GLUCOSE 318* 320* 171*  BUN 96* 91* 91*  CREATININE 3.02* 2.78* 2.46*  CALCIUM 8.4* 8.0* 7.9*    LFT   Recent Labs  03/08/17 1527 03/10/17 0450  PROT 5.8* 4.9*  ALBUMIN 3.0* 2.4*  AST 26 14*  ALT 11* 12*  ALKPHOS 56 43  BILITOT 0.3 0.5    PT/INR   Recent Labs  03/08/17 1527  LABPROT 15.5*  INR 1.24    Serum iron 37, TIBC 239 and saturation 15% Serum ferritin 2948.  Troponin I level 2.06; it was 3.97 on admission and 3.58 yesterday.  Assessment:  #1. Anemia. Patient has received 4 units of PRBCs and hemoglobin has increased from 4.8 g to 8.1 and she feels a lot better. Iron studies consistent with anemia of chronic disease.  Elevation of serum ferritin appears to be acute phase reaction. It remains to be seen if stool will be heme positive are not. if stool is guaiac negative she could resume antiplatelet agent. Consider single agent rather than dual therapy.finally she may be a candidate for erythropoiesis stimulating agents.  #2. Elevated troponin levelsfelt to be due to demand ischemia. Patient does not have chest pain or shortness of breath.  #3.  Chronic kidney disease.serum  creatinine is down. GFR has improved from 14 on admission to 18 today.  Recommendations:  Await results of stool guaiac. CBC in a.m.

## 2017-03-11 DIAGNOSIS — D649 Anemia, unspecified: Secondary | ICD-10-CM

## 2017-03-11 LAB — COMPREHENSIVE METABOLIC PANEL
ALK PHOS: 50 U/L (ref 38–126)
ALT: 11 U/L — ABNORMAL LOW (ref 14–54)
ANION GAP: 8 (ref 5–15)
AST: 14 U/L — ABNORMAL LOW (ref 15–41)
Albumin: 2.4 g/dL — ABNORMAL LOW (ref 3.5–5.0)
BILIRUBIN TOTAL: 0.6 mg/dL (ref 0.3–1.2)
BUN: 87 mg/dL — ABNORMAL HIGH (ref 6–20)
CO2: 20 mmol/L — ABNORMAL LOW (ref 22–32)
Calcium: 7.9 mg/dL — ABNORMAL LOW (ref 8.9–10.3)
Chloride: 110 mmol/L (ref 101–111)
Creatinine, Ser: 2.44 mg/dL — ABNORMAL HIGH (ref 0.44–1.00)
GFR, EST AFRICAN AMERICAN: 21 mL/min — AB (ref >60)
GFR, EST NON AFRICAN AMERICAN: 18 mL/min — AB (ref >60)
GLUCOSE: 219 mg/dL — AB (ref 65–99)
Potassium: 3.8 mmol/L (ref 3.5–5.1)
Sodium: 138 mmol/L (ref 135–145)
TOTAL PROTEIN: 4.9 g/dL — AB (ref 6.5–8.1)

## 2017-03-11 LAB — GLUCOSE, CAPILLARY
GLUCOSE-CAPILLARY: 302 mg/dL — AB (ref 65–99)
GLUCOSE-CAPILLARY: 131 mg/dL — AB (ref 65–99)
Glucose-Capillary: 213 mg/dL — ABNORMAL HIGH (ref 65–99)
Glucose-Capillary: 104 mg/dL — ABNORMAL HIGH (ref 65–99)

## 2017-03-11 LAB — CBC
HEMATOCRIT: 22.8 % — AB (ref 36.0–46.0)
HEMOGLOBIN: 7.3 g/dL — AB (ref 12.0–15.0)
MCH: 29.8 pg (ref 26.0–34.0)
MCHC: 32 g/dL (ref 30.0–36.0)
MCV: 93.1 fL (ref 78.0–100.0)
Platelets: 149 10*3/uL — ABNORMAL LOW (ref 150–400)
RBC: 2.45 MIL/uL — ABNORMAL LOW (ref 3.87–5.11)
RDW: 17.3 % — ABNORMAL HIGH (ref 11.5–15.5)
WBC: 8.8 10*3/uL (ref 4.0–10.5)

## 2017-03-11 LAB — OCCULT BLOOD X 1 CARD TO LAB, STOOL: Fecal Occult Bld: NEGATIVE

## 2017-03-11 LAB — HEMOGLOBIN AND HEMATOCRIT, BLOOD
HEMATOCRIT: 24.3 % — AB (ref 36.0–46.0)
Hemoglobin: 8 g/dL — ABNORMAL LOW (ref 12.0–15.0)

## 2017-03-11 LAB — PREPARE RBC (CROSSMATCH)

## 2017-03-11 MED ORDER — INSULIN DETEMIR 100 UNIT/ML ~~LOC~~ SOLN
24.0000 [IU] | Freq: Every day | SUBCUTANEOUS | Status: DC
  Administered 2017-03-11 – 2017-03-12 (×2): 24 [IU] via SUBCUTANEOUS
  Filled 2017-03-11 (×3): qty 0.24

## 2017-03-11 MED ORDER — SODIUM CHLORIDE 0.9 % IV SOLN
Freq: Once | INTRAVENOUS | Status: AC
  Administered 2017-03-11: 10:00:00 via INTRAVENOUS

## 2017-03-11 MED ORDER — INSULIN ASPART 100 UNIT/ML ~~LOC~~ SOLN
7.0000 [IU] | Freq: Three times a day (TID) | SUBCUTANEOUS | Status: DC
  Administered 2017-03-11 – 2017-03-12 (×3): 7 [IU] via SUBCUTANEOUS

## 2017-03-11 MED ORDER — BISACODYL 10 MG RE SUPP
10.0000 mg | Freq: Once | RECTAL | Status: AC
  Administered 2017-03-11: 10 mg via RECTAL
  Filled 2017-03-11: qty 1

## 2017-03-11 NOTE — Progress Notes (Signed)
PROGRESS NOTE   [name]  YYT:[id number]  DOB: 10/20/1940  DOA: 03/08/2017 PCP: [name], MD  Brief Admission Hx: [name] is a 76 y.o. female with medical history significant of GI bleeding, gastritis, hypertension, diabetes mellitus, GERD, hypothyroidism, gout, CAD, iron deficiency anemia, CKD-4, CAD, NSTEMI, who presents with generalized weakness, slurred speech.  pt was found to have hemoglobin dropped from 10.3 on 11/23/16 to 4.8.  She was admitted with severe anemia.    MDM/Assessment & Plan:   1. Severe symptomatic anemia - Pt is ordered to have 4 units PRBC transfused.  Follow Hg closely.  Request GI consult.  FOBT pending.  Pt has had extensive GI work up with past admissions for this recurrent problem.  She was recently restarted on plavix 2 weeks ago.  IV protonix.  Pt is followed by hematology and gets iron transfusions. Transfer to telemetry.   Hemoccults pending.  Hg down to 7.1. Will transfuse 1 unit PRBC.   2. Mental Status changes - slurred speech completely resolved, CT head negative for acute findings.  MRI brain no acute findings.    3. Elevated troponin - spoke with cardiology who recommended no intervention at this time, suspect demand ischemia secondary to severe anemia. Trop trending down. Pt having no symptoms. Echo: EF 60-65% no regional wall abnormalities seen.  4. Stage 4 CKD - avoid nephrotoxins as much as possible and renally dose medications.  Follow renal function closely.  5. Diabetes mellitus type 2 - Adjusting therapy and increasing supplemental coverage.   6. Hypothyroidism - resume home levothyroxine. 7. Hypertension - holding home meds, IV hydralazine ordered as needed.  8. Fever - suspect was related to blood transfusions, no recurrence. No infection found.Tylenol ordered as needed.   DVT prophylaxis: SCDs Code Status: FULL  Family Communication:  Disposition Plan: TBD  Subjective: Pt really wants to go home.       Objective: Vitals:   03/10/17 1300 03/10/17 1400 03/10/17 1956 03/11/17 0505  BP: (!) 129/110 (!) 106/48 (!) 124/38 (!) 125/40  Pulse: 74 73 84 81  Resp: 19 18 18 18   Temp:   98.2 F (36.8 C) 98 F (36.7 C)  TempSrc:   Oral Oral  SpO2: 95% 93% 90% 90%  Weight:      Height:        Intake/Output Summary (Last 24 hours) at 03/11/17 0840 Last data filed at 03/11/17 0600  Gross per 24 hour  Intake              720 ml  Output              800 ml  Net              -80 ml   Filed Weights   03/08/17 2119 03/09/17 0415 03/10/17 0500  Weight: 80.1 kg (176 lb 9.4 oz) 80.5 kg (177 lb 7.5 oz) 82.4 kg (181 lb 10.5 oz)   REVIEW OF SYSTEMS  As per history otherwise all reviewed and reported negative  Exam:  General exam: awake, alert, NAD. Cooperative. Dry MM.  Respiratory system: Clear. No increased work of breathing. Cardiovascular system: S1 & S2 heard.  Gastrointestinal system: Abdomen is nondistended, soft and nontender. Normal bowel sounds heard. Central nervous system: Alert and oriented. No focal neurological deficits. Extremities: no CCE.  Data Reviewed: Basic Metabolic Panel:  Recent Labs Lab 03/08/17 1527 03/09/17 0824 03/10/17 0450 03/11/17 0628  NA 138 140 141 138  K 4.2  4.0 3.8 3.8  CL 104 107 110 110  CO2 21* 21* 22 20*  GLUCOSE 318* 320* 171* 219*  BUN 96* 91* 91* 87*  CREATININE 3.02* 2.78* 2.46* 2.44*  CALCIUM 8.4* 8.0* 7.9* 7.9*   Liver Function Tests:  Recent Labs Lab 03/08/17 1527 03/10/17 0450 03/11/17 0628  AST 26 14* 14*  ALT 11* 12* 11*  ALKPHOS 56 43 50  BILITOT 0.3 0.5 0.6  PROT 5.8* 4.9* 4.9*  ALBUMIN 3.0* 2.4* 2.4*   No results for input(s): LIPASE, AMYLASE in the last 168 hours. No results for input(s): AMMONIA in the last 168 hours. CBC:  Recent Labs Lab 03/08/17 1527 03/08/17 2321 03/09/17 0824 03/10/17 0450 03/11/17 0628  WBC 14.1* 17.5* 16.3* 10.9* 8.8  NEUTROABS 11.9*  --   --   --   --   HGB 4.8* 6.3* 8.4* 8.1*  7.3*  HCT 14.6* 19.5* 25.3* 24.3* 22.8*  MCV 94.8 93.3 91.3 91.0 93.1  PLT 302 322 252 214 149*   Cardiac Enzymes:  Recent Labs Lab 03/08/17 1527 03/08/17 2321 03/09/17 0823 03/09/17 2026  TROPONINI 3.97* 3.06* 3.58* 2.06*   CBG (last 3)   Recent Labs  03/10/17 1644 03/10/17 2139 03/11/17 0751  GLUCAP 212* 185* 213*   Recent Results (from the past 240 hour(s))  MRSA PCR Screening     Status: None   Collection Time: 03/08/17  9:20 PM  Result Value Ref Range Status   MRSA by PCR NEGATIVE NEGATIVE Final    Comment:        The GeneXpert MRSA Assay (FDA approved for NASAL specimens only), is one component of a comprehensive MRSA colonization surveillance program. It is not intended to diagnose MRSA infection nor to guide or monitor treatment for MRSA infections.   Culture, blood (Routine X 2) w Reflex to ID Panel     Status: None (Preliminary result)   Collection Time: 03/09/17  8:23 AM  Result Value Ref Range Status   Specimen Description LEFT ANTECUBITAL  Final   Special Requests   Final    BOTTLES DRAWN AEROBIC AND ANAEROBIC Blood Culture adequate volume   Culture NO GROWTH 2 DAYS  Final   Report Status PENDING  Incomplete  Culture, blood (Routine X 2) w Reflex to ID Panel     Status: None (Preliminary result)   Collection Time: 03/09/17  8:23 AM  Result Value Ref Range Status   Specimen Description BLOOD LEFT FOREARM  Final   Special Requests   Final    BOTTLES DRAWN AEROBIC AND ANAEROBIC Blood Culture adequate volume   Culture NO GROWTH 2 DAYS  Final   Report Status PENDING  Incomplete    Studies: Mr Brain Wo Contrast  Result Date: 03/09/2017 CLINICAL DATA:  TIA.  Patient admitted with slurred speech EXAM: MRI HEAD WITHOUT CONTRAST TECHNIQUE: Multiplanar, multiecho pulse sequences of the brain and surrounding structures were obtained without intravenous contrast. COMPARISON:  Head CT from yesterday FINDINGS: Brain: No acute infarction, hemorrhage,  hydrocephalus, extra-axial collection or mass lesion. Overall mild chronic microvascular ischemic gliosis in the deep cerebral white matter. 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 Age normal brain volume. Vascular: Major flow voids are preserved Skull and upper cervical spine: No evidence of marrow lesion. Sinuses/Orbits: Left cataract resection.  No acute finding IMPRESSION: 1. No acute finding including infarct. 2. Mild chronic small vessel ischemia in the cerebral white matter. 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 Electronically Signed  By: Monte Fantasia M.D.   On: 03/09/2017 10:09   Dg Chest Port 1 View  Result Date: 03/10/2017 CLINICAL DATA:  Fever, leukocytosis EXAM: PORTABLE CHEST 1 VIEW COMPARISON:  03/08/2017 FINDINGS: Pulmonary vascular congestion. No frank interstitial edema. No pleural effusion or pneumothorax. Cardiomegaly. Postsurgical changes involving the left proximal humerus, incompletely visualized. IMPRESSION: Cardiomegaly with pulmonary vascular congestion. No frank interstitial edema. Electronically Signed   By: Julian Hy M.D.   On: 03/10/2017 07:38   Scheduled Meds: . acetaminophen  650 mg Oral Q6H  . bisacodyl  10 mg Rectal Once  . febuxostat  40 mg Oral Daily  . ferrous sulfate  325 mg Oral Q breakfast  . gabapentin  600 mg Oral QHS  . insulin aspart  0-15 Units Subcutaneous TID WC  . insulin aspart  0-5 Units Subcutaneous QHS  . insulin aspart  7 Units Subcutaneous TID WC  . insulin detemir  24 Units Subcutaneous Daily  . levothyroxine  50 mcg Oral QAC breakfast  . metoprolol tartrate  12.5 mg Oral BID  . multivitamin with minerals  1 tablet Oral Daily  . pantoprazole  40 mg Oral Q0600  . senna-docusate  2 tablet Oral BID   Continuous Infusions:  Principal Problem:   Symptomatic anemia Active Problems:   Diabetes mellitus with stage 4 chronic kidney disease (HCC)   Gout   Chronic kidney disease, stage IV  (severe) (HCC)   Elevated troponin   Iron deficiency anemia   Essential hypertension, benign   Hypothyroidism   Slurred speech   Acute on chronic anemia   Irwin Brakeman, MD, FAAFP [facility] Pager [phone]  If 7PM-7AM, please contact night-coverage [email or web address] Password [email or web address] 03/11/2017, 8:40 AM    LOS: 3 days

## 2017-03-11 NOTE — Progress Notes (Signed)
Subjective: No overt GI bleeding. No abdominal pain, N/V. Declined rectal exam, stating she wants to go home. Hemoccults still have not been collected: she has not had a BM since admission.   Objective: Vital signs in last 24 hours: Temp:  [98 F (36.7 C)-98.4 F (36.9 C)] 98 F (36.7 C) (10/15 0505) Pulse Rate:  [73-84] 81 (10/15 0505) Resp:  [14-19] 18 (10/15 0505) BP: (106-129)/(38-110) 125/40 (10/15 0505) SpO2:  [88 %-96 %] 90 % (10/15 0505) Last BM Date: 03/08/17 General:   Alert and oriented, pleasant Head:  Normocephalic and atraumatic. Eyes:  No icterus, sclera clear. Conjuctiva pink.  Abdomen:  Bowel sounds present, full but soft, non-tender.      No HSM or hernias noted. No rebound or guarding. No masses appreciated  Msk:  Symmetrical without gross deformities. Normal posture. Extremities:  Without  edema. Neurologic:  Alert and  oriented x4 Psych:  Alert and cooperative. Normal mood and affect.  Intake/Output from previous day: 10/14 0701 - 10/15 0700 In: 960 [P.O.:960] Out: 800 [Urine:800] Intake/Output this shift: No intake/output data recorded.  Lab Results:  Recent Labs  03/09/17 0824 03/10/17 0450 03/11/17 0628  WBC 16.3* 10.9* 8.8  HGB 8.4* 8.1* 7.3*  HCT 25.3* 24.3* 22.8*  PLT 252 214 149*   BMET  Recent Labs  03/09/17 0824 03/10/17 0450 03/11/17 0628  NA 140 141 138  K 4.0 3.8 3.8  CL 107 110 110  CO2 21* 22 20*  GLUCOSE 320* 171* 219*  BUN 91* 91* 87*  CREATININE 2.78* 2.46* 2.44*  CALCIUM 8.0* 7.9* 7.9*   LFT  Recent Labs  03/08/17 1527 03/10/17 0450 03/11/17 0628  PROT 5.8* 4.9* 4.9*  ALBUMIN 3.0* 2.4* 2.4*  AST 26 14* 14*  ALT 11* 12* 11*  ALKPHOS 56 43 50  BILITOT 0.3 0.5 0.6   PT/INR  Recent Labs  03/08/17 1527  LABPROT 15.5*  INR 1.24    Studies/Results: Mr Brain Wo Contrast  Result Date: 03/09/2017 CLINICAL DATA:  TIA.  Patient admitted with slurred speech EXAM: MRI HEAD WITHOUT CONTRAST TECHNIQUE:  Multiplanar, multiecho pulse sequences of the brain and surrounding structures were obtained without intravenous contrast. COMPARISON:  Head CT from yesterday FINDINGS: Brain: No acute infarction, hemorrhage, hydrocephalus, extra-axial collection or mass lesion. Overall mild chronic microvascular ischemic gliosis in the deep cerebral white matter. 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 Age normal brain volume. Vascular: Major flow voids are preserved Skull and upper cervical spine: No evidence of marrow lesion. Sinuses/Orbits: Left cataract resection.  No acute finding IMPRESSION: 1. No acute finding including infarct. 2. Mild chronic small vessel ischemia in the cerebral white matter. 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 Electronically Signed   By: Monte Fantasia M.D.   On: 03/09/2017 10:09   Dg Chest Port 1 View  Result Date: 03/10/2017 CLINICAL DATA:  Fever, leukocytosis EXAM: PORTABLE CHEST 1 VIEW COMPARISON:  03/08/2017 FINDINGS: Pulmonary vascular congestion. No frank interstitial edema. No pleural effusion or pneumothorax. Cardiomegaly. Postsurgical changes involving the left proximal humerus, incompletely visualized. IMPRESSION: Cardiomegaly with pulmonary vascular congestion. No frank interstitial edema. Electronically Signed   By: Julian Hy M.D.   On: 03/10/2017 07:38    Assessment: Very pleasant 76 year old female admitted due to severe anemia (Hgb 4.8)  requiring 4 unit blood transfusion, unknown hemoccult status, reports of black stool but on iron as outpatient and Plavix, which had recently been restarted a few weeks ago due  to concern for chronic obstructive CAD. She has a history of NSTEMI April 2018, unable to undergo ischemic testing due to renal disease. Significant past history including multiple prior blood transfusions and admission to both [facility] and [facility] in May and June 2018. Colonoscopy May 2018 at [facility] with one 5 mm polyp not  manipulated at transverse colon but without source for bleeding, EGD with gastritis, and negative small bowel capsule study. June 2018 at [facility] with repeat capsule that was also normal. In the past, she had been on DAPT, which had been on hold since June 2018.  Plavix as single agent therapy without aspirin restarted 2 weeks ago as mentioned, due to high concern for chronic obstructive CAD.   No overt GI bleeding this admission and declined a rectal exam today (attempt to obtain hemoccult status). Hgb now 7.3 today, previously 8.4 two days ago.  Elevated ferritin this admission likely acute reactant. Appears to have a multifactorial anemia with chronic disease component. Established with Hematology as outpatient, and it appears hematology has been consulted as inpatient. As no overt GI bleeding, capsule study would likely not offer any further details, as this has been completed twice this year. Plavix to remain on hold for now.   Once appropriate for discharge, will need close monitoring of labs as outpatient (weekly until stable and then no fewer than every 2-4 week checks), close hematology follow-up, and could possibly benefit from Procrit as outpatient.   No endoscopic procedures today. Patient has eaten breakfast, feels much improved. I have discussed with RN, Claiborne Billings, completing a hemoccult today so we may have this on file and help guide decision-making.   Plan: Plavix on hold PPI daily Discontinue oral iron during inpatient stay in case of procedures Hematology consult as ordered by attending Continue serial H/H Watch for overt GI bleeding: hemoccult to be obtained today via nursing staff 1 unit PRBCs ordered per attending Will have her NPO after midnight and will reassess tomorrow Elective early interval outpatient colonoscopy will need to be performed at some point due to non-manipulated polyp seen on colonoscopy several months ago at [facility] Once discharged, will need weekly labs until  stable and then no fewer than every 2-4 weeks thereafter Close follow-up as outpatient with Hematology as well   Annitta Needs, PhD, ANP-BC [facility]     LOS: 3 days    03/11/2017, 7:58 AM

## 2017-03-11 NOTE — Significant Event (Signed)
03/11/2017 11:33 AM  Came to rapid response event, patient had acute bradycardia event while on toilet having a BM.  Pt had a large black melanotic stool.  After looking at the stool, it became clear to me that she is loosing a large amount of blood in her GI tract.  Pt was placed on oxygen, placed back in bed, EKG was done and reviewed, HR recovered and back in normal sinus rhythm.  Pt is currently being transfused 1 unit PRBC.  She is NPO after midnight for possible GI procedure tomorrow.   Murvin Natal MD

## 2017-03-11 NOTE — Progress Notes (Signed)
Responded to Rapid Response, RN, NT and MD at bedside. ECG performed per Dr. Wynetta Emery and results handed to him.

## 2017-03-11 NOTE — Progress Notes (Addendum)
Reviewed with Claiborne Billings, RN, most recent events with patient. Became bradycardic while on toilet with large black  stool noted per nursing staff. She was heme negative on rectal exam just prior to this event.  She is currently being transfused 1 unit PRBCs, which had already been ordered earlier this morning. Last oral intake was this morning at breakfast. Vitals now stable with BP 120s/50s, pulse most recently 73. I have made her NPO now. Agree with transfusing as needed. Reviewing with Dr. Gala Romney.     Reviewed with Dr. Gala Romney. She will need Propofol for EGD, and as she has eaten breakfast, would best be served to have tomorrow, 10/16. Iron has been stopped as of this morning, with last dose yesterday. I have reviewed this with Claiborne Billings, RN.   I called into patient's room and spoke with [name], her partner. He was aware of plan. Patient is resting now. Discussed plans for EGD tomorrow, ?capsule study depending on EGD findings.   Continue to monitor H/H, watch for any overt GI bleeding. May have clear liquids now but NPO after midnight.

## 2017-03-11 NOTE — Progress Notes (Signed)
Yolanda, NT called me and told me patient was feeling faint after sitting on the bedside commode having a BM. As I was going to patient's room, central telemetry called and told me patient was bradycardic in the 30's. I called a rapid response. We got the patient back into the bed,  a set of vitals was completed (see vitals flowsheet). Blood pressure and heart rate were fine, oxygen was 86% on room air. We placed her on 4L/[place] to get her oxygen up to 95%. Patient did have a large black stool. I informed Roseanne Kaufman, NP with GI.

## 2017-03-12 ENCOUNTER — Inpatient Hospital Stay (HOSPITAL_COMMUNITY): Payer: Medicare HMO | Admitting: Anesthesiology

## 2017-03-12 ENCOUNTER — Encounter (HOSPITAL_COMMUNITY)
Admission: EM | Disposition: A | Discharge status: Home / Self Care | Payer: Self-pay | Source: Home / Self Care | Attending: Family Medicine

## 2017-03-12 ENCOUNTER — Encounter (HOSPITAL_COMMUNITY): Payer: Self-pay | Admitting: *Deleted

## 2017-03-12 DIAGNOSIS — K922 Gastrointestinal hemorrhage, unspecified: Secondary | ICD-10-CM | POA: Diagnosis not present

## 2017-03-12 DIAGNOSIS — D649 Anemia, unspecified: Secondary | ICD-10-CM | POA: Diagnosis not present

## 2017-03-12 DIAGNOSIS — R4781 Slurred speech: Secondary | ICD-10-CM

## 2017-03-12 DIAGNOSIS — D509 Iron deficiency anemia, unspecified: Principal | ICD-10-CM

## 2017-03-12 DIAGNOSIS — E611 Iron deficiency: Secondary | ICD-10-CM

## 2017-03-12 DIAGNOSIS — K2970 Gastritis, unspecified, without bleeding: Secondary | ICD-10-CM

## 2017-03-12 HISTORY — PX: ESOPHAGOGASTRODUODENOSCOPY (EGD) WITH PROPOFOL: SHX5813

## 2017-03-12 LAB — GLUCOSE, CAPILLARY
Glucose-Capillary: 153 mg/dL — ABNORMAL HIGH (ref 65–99)
Glucose-Capillary: 105 mg/dL — ABNORMAL HIGH (ref 65–99)
Glucose-Capillary: 99 mg/dL (ref 65–99)

## 2017-03-12 LAB — COMPREHENSIVE METABOLIC PANEL
ALT: 11 U/L — ABNORMAL LOW (ref 14–54)
ANION GAP: 10 (ref 5–15)
AST: 13 U/L — ABNORMAL LOW (ref 15–41)
Albumin: 2.4 g/dL — ABNORMAL LOW (ref 3.5–5.0)
Alkaline Phosphatase: 52 U/L (ref 38–126)
BILIRUBIN TOTAL: 0.4 mg/dL (ref 0.3–1.2)
BUN: 73 mg/dL — ABNORMAL HIGH (ref 6–20)
CO2: 22 mmol/L (ref 22–32)
Calcium: 8.4 mg/dL — ABNORMAL LOW (ref 8.9–10.3)
Chloride: 110 mmol/L (ref 101–111)
Creatinine, Ser: 2.15 mg/dL — ABNORMAL HIGH (ref 0.44–1.00)
GFR, EST AFRICAN AMERICAN: 24 mL/min — AB (ref >60)
GFR, EST NON AFRICAN AMERICAN: 21 mL/min — AB (ref >60)
Glucose, Bld: 115 mg/dL — ABNORMAL HIGH (ref 65–99)
POTASSIUM: 3.8 mmol/L (ref 3.5–5.1)
Sodium: 142 mmol/L (ref 135–145)
TOTAL PROTEIN: 5.1 g/dL — AB (ref 6.5–8.1)

## 2017-03-12 LAB — TYPE AND SCREEN
ABO/RH(D): B POS
Antibody Screen: NEGATIVE
UNIT DIVISION: 0
UNIT DIVISION: 0
UNIT DIVISION: 0
UNIT DIVISION: 0
Unit division: 0

## 2017-03-12 LAB — CBC
HEMATOCRIT: 26.3 % — AB (ref 36.0–46.0)
Hemoglobin: 8.5 g/dL — ABNORMAL LOW (ref 12.0–15.0)
MCH: 29.8 pg (ref 26.0–34.0)
MCHC: 32.3 g/dL (ref 30.0–36.0)
MCV: 92.3 fL (ref 78.0–100.0)
Platelets: 260 10*3/uL (ref 150–400)
RBC: 2.85 MIL/uL — ABNORMAL LOW (ref 3.87–5.11)
RDW: 17.7 % — AB (ref 11.5–15.5)
WBC: 7 10*3/uL (ref 4.0–10.5)

## 2017-03-12 LAB — BPAM RBC
BLOOD PRODUCT EXPIRATION DATE: 201811032359
Blood Product Expiration Date: 201810152359
Blood Product Expiration Date: 201811032359
Blood Product Expiration Date: 201811032359
Blood Product Expiration Date: 201811152359
ISSUE DATE / TIME: 201810122359
ISSUE DATE / TIME: 201810151036
ISSUE DATE / TIME: 201810121742
ISSUE DATE / TIME: 201810130357
ISSUE DATE / TIME: 201810131143
UNIT TYPE AND RH: 5100
UNIT TYPE AND RH: 5100
Unit Type and Rh: 5100
Unit Type and Rh: 5100
Unit Type and Rh: 5100

## 2017-03-12 SURGERY — ESOPHAGOGASTRODUODENOSCOPY (EGD) WITH PROPOFOL
Anesthesia: Monitor Anesthesia Care

## 2017-03-12 MED ORDER — PROPOFOL 500 MG/50ML IV EMUL
INTRAVENOUS | Status: DC | PRN
  Administered 2017-03-12: 100 ug/kg/min via INTRAVENOUS

## 2017-03-12 MED ORDER — FENTANYL CITRATE (PF) 100 MCG/2ML IJ SOLN
INTRAMUSCULAR | Status: AC
  Filled 2017-03-12: qty 2

## 2017-03-12 MED ORDER — LIDOCAINE VISCOUS 2 % MT SOLN
5.0000 mL | Freq: Once | OROMUCOSAL | Status: AC
  Administered 2017-03-12: 5 mL via OROMUCOSAL

## 2017-03-12 MED ORDER — SODIUM CHLORIDE 0.9 % IV SOLN
INTRAVENOUS | Status: DC
  Administered 2017-03-12: 07:00:00 via INTRAVENOUS

## 2017-03-12 MED ORDER — LIDOCAINE VISCOUS 2 % MT SOLN
OROMUCOSAL | Status: AC
  Filled 2017-03-12: qty 15

## 2017-03-12 MED ORDER — MIDAZOLAM HCL 2 MG/2ML IJ SOLN
INTRAMUSCULAR | Status: AC
  Filled 2017-03-12: qty 2

## 2017-03-12 MED ORDER — SODIUM CHLORIDE 0.9% FLUSH
INTRAVENOUS | Status: AC
  Filled 2017-03-12: qty 10

## 2017-03-12 MED ORDER — SENNOSIDES-DOCUSATE SODIUM 8.6-50 MG PO TABS: 1.0000 | ORAL_TABLET | Freq: Two times a day (BID) | ORAL | Status: DC

## 2017-03-12 MED ORDER — STERILE WATER FOR IRRIGATION IR SOLN
Status: DC | PRN
  Administered 2017-03-12: 2.5 mL

## 2017-03-12 MED ORDER — LACTATED RINGERS IV SOLN
INTRAVENOUS | Status: DC
  Administered 2017-03-12: 11:00:00 via INTRAVENOUS

## 2017-03-12 MED ORDER — MIDAZOLAM HCL 2 MG/2ML IJ SOLN
1.0000 mg | INTRAMUSCULAR | Status: DC
  Administered 2017-03-12: 2 mg via INTRAVENOUS

## 2017-03-12 MED ORDER — FENTANYL CITRATE (PF) 100 MCG/2ML IJ SOLN
25.0000 ug | Freq: Once | INTRAMUSCULAR | Status: AC
  Administered 2017-03-12: 25 ug via INTRAVENOUS

## 2017-03-12 MED ORDER — PROPOFOL 10 MG/ML IV BOLUS
INTRAVENOUS | Status: DC | PRN
  Administered 2017-03-12: 20 mg via INTRAVENOUS

## 2017-03-12 NOTE — Transfer of Care (Signed)
Immediate Anesthesia Transfer of Care Note  Patient: [name]  Procedure(s) Performed: ESOPHAGOGASTRODUODENOSCOPY (EGD) WITH PROPOFOL (N/A )  Patient Location: PACU  Anesthesia Type:MAC  Level of Consciousness: awake and patient cooperative  Airway & Oxygen Therapy: Patient Spontanous Breathing and Patient connected to nasal cannula oxygen  Post-op Assessment: Report given to RN and Post -op Vital signs reviewed and stable  Post vital signs: Reviewed and stable  Last Vitals:  Vitals:   03/12/17 1115 03/12/17 1120  BP: (!) 150/63   Pulse:    Resp: 15 18  Temp:    SpO2: 98% 92%    Last Pain:  Vitals:   03/12/17 1048  TempSrc: Oral  PainSc:          Complications: No apparent anesthesia complications

## 2017-03-12 NOTE — Progress Notes (Signed)
Patient has gone off the unit for her EGD. Telemetry placed on stand by

## 2017-03-12 NOTE — Anesthesia Procedure Notes (Signed)
Procedure Name: MAC Date/Time: 03/12/2017 11:23 AM Performed by: Vista Deck Pre-anesthesia Checklist: Patient identified, Emergency Drugs available, Suction available, Timeout performed and Patient being monitored Patient Re-evaluated:Patient Re-evaluated prior to induction Oxygen Delivery Method: Non-rebreather mask

## 2017-03-12 NOTE — Op Note (Signed)
[facility] Patient Name: [name] Procedure Date: 03/12/2017 11:00 AM MRN: [id number] Date of Birth: March 28, 1941 Attending MD: Barney Drain MD, MD CSN: [id number] Age: 76 Admit Type: Outpatient Procedure:                Upper GI endoscopy WITH COLD FORCEPS BIOPSY Indications:              Anemia, NORMOCYTIC(OCT 15: Hb 4.8, MCV 94.8,                            FERRITIN 2.9k, Cr 3.08) 2018: EGD/TCS/GIVENS x 2-NO                            SOURCE FOR ANEMIA IDENTIFIED. UPEP: NEGATIVE,                            ADMITTED OCT 12. HEME NEGATIVE OCT 15. ON ORAL IRON                            AS OUTPATIENT. HAS SEEN BLACK STOOL. Providers:                Barney Drain MD, MD, Janeece Riggers, RN, Selena Lesser, Randa Spike, Technician Referring MD:             Lysle Morales Medicines:                Propofol per Anesthesia Complications:            No immediate complications. Estimated Blood Loss:     Estimated blood loss was minimal. Procedure:                Pre-Anesthesia Assessment:                           - Prior to the procedure, a History and Physical                            was performed, and patient medications and                            allergies were reviewed. The patient's tolerance of                            previous anesthesia was also reviewed. The risks                            and benefits of the procedure and the sedation                            options and risks were discussed with the patient.                            All questions were answered, and informed consent  was obtained. Prior Anticoagulants: The patient has                            taken Plavix (clopidogrel), last dose was 5 days                            prior to procedure. ASA Grade Assessment: II - A                            patient with mild systemic disease. After reviewing                            the risks and  benefits, the patient was deemed in                            satisfactory condition to undergo the procedure.                            After obtaining informed consent, the endoscope was                            passed under direct vision. Throughout the                            procedure, the patient's blood pressure, pulse, and                            oxygen saturations were monitored continuously. The                            [id number]-[id number] ([id number]) scope was introduced through the                            mouth, and advanced to the second part of duodenum.                            The upper GI endoscopy was accomplished without                            difficulty. The patient tolerated the procedure                            well. Scope In: 11:36:39 AM Scope Out: 11:43:22 AM Total Procedure Duration: 0 hours 6 minutes 43 seconds  Findings:      The examined esophagus was normal.      Patchy mild inflammation characterized by congestion (edema) and       erythema was found in the gastric antrum.      The examined duodenum was normal. Biopsies for histology were taken with       a cold forceps for evaluation of celiac disease. Impression:               - NO SOURCE FOR ANEMIA IDENTIFIED                           -  MILD Gastritis. Moderate Sedation:      Per Anesthesia Care Recommendation:           - Await pathology results.                           - Soft diet and diabetic (ADA) diet.                           - Continue present medications. STOP ORAL IRON.                            IVFE IF NEEDED. CONSIDER BONE MARROW BIOPSY.                           - Return to my office in 2 months.                           - Patient has a contact number available for                            emergencies. The signs and symptoms of potential                            delayed complications were discussed with the                            patient. Return to normal activities  tomorrow.                            Written discharge instructions were provided to the                            patient. Procedure Code(s):        --- Professional ---                           [phone], Esophagogastroduodenoscopy, flexible,                            transoral; with biopsy, single or multiple Diagnosis Code(s):        --- Professional ---                           K29.70, Gastritis, unspecified, without bleeding                           D50.9, Iron deficiency anemia, unspecified CPT copyright 2016 American Medical Association. All rights reserved. The codes documented in this report are preliminary and upon coder review may  be revised to meet current compliance requirements. Barney Drain, MD Barney Drain MD, MD 03/12/2017 12:04:59 PM This report has been signed electronically. Number of Addenda: 0

## 2017-03-12 NOTE — H&P (Signed)
Primary Care Physician:  [name], MD Primary Gastroenterologist:  Dr. Oneida Alar  Pre-Procedure History & Physical: HPI:  [name] is a 76 y.o. female here for Anemia/heme negative stools.  Past Medical History:  Diagnosis Date  . Anemia   . Arthritis   . Blood transfusion without reported diagnosis   . Cataract   . Chronic anemia   . Chronic combined systolic and diastolic CHF (congestive heart failure) ([place])    a. 2D echo 08/2016 at [facility]: EF 50-55% with inferior wall HK, impaired LV filling, fair study.  . CKD (chronic kidney disease), stage III ([place])   . Diabetes ([place])   . Gastritis   . GERD (gastroesophageal reflux disease)   . Gout   . HTN (hypertension)   . Hyperlipidemia   . Iron deficiency anemia 11/08/2016  . Normocytic anemia 10/26/2016  . NSTEMI (non-ST elevated myocardial infarction) ([place])    a. Complex admission 08/2016 - with severe hyperglycemia, AKI on CKD, severe anemia down to Hgb 6.8, acute combined CHF, troponin of 8.5, cath deferred due to renal dysfunction.  . Thyroid disease     Past Surgical History:  Procedure Laterality Date  . COLONOSCOPY WITH PROPOFOL N/A 10/08/2016   Procedure: COLONOSCOPY WITH PROPOFOL;  Surgeon: Ronnette Juniper, MD;  Location: [place];  Service: Gastroenterology;  Laterality: N/A;  . ESOPHAGOGASTRODUODENOSCOPY N/A 10/06/2016   Procedure: ESOPHAGOGASTRODUODENOSCOPY (EGD);  Surgeon: Otis Brace, MD;  Location: [facility] ENDOSCOPY;  Service: Gastroenterology;  Laterality: N/A;  . GIVENS CAPSULE STUDY  10/08/2016   Procedure: GIVENS CAPSULE STUDY;  Surgeon: Ronnette Juniper, MD;  Location: [place];  Service: Gastroenterology;;  . Freda Munro CAPSULE STUDY N/A 10/29/2016   Procedure: GIVENS CAPSULE STUDY;  Surgeon: Danie Binder, MD;  [name];  Service: Endoscopy;  Laterality: N/A;  . ORIF HUMERUS FRACTURE Left 02/07/2015   Procedure: OPEN REDUCTION INTERNAL FIXATION (ORIF) PROXIMAL HUMERUS FRACTURE;  Surgeon: Marybelle Killings, MD;  Location: [place];  Service: Orthopedics;  Laterality: Left;  . THYROID SURGERY    . TOTAL HIP ARTHROPLASTY Left 02/07/2015   Procedure: TOTAL HIP ARTHROPLASTY ANTERIOR APPROACH ;  Surgeon: Marybelle Killings, MD;  Location: [place];  Service: Orthopedics;  Laterality: Left;  . WRIST SURGERY Left     Prior to Admission medications   Medication Sig Start Date End Date Taking? Authorizing Provider  amLODipine (NORVASC) 10 MG tablet Take 1 tablet (10 mg total) by mouth daily. 01/25/17  Yes [name], MD  clopidogrel (PLAVIX) 75 MG tablet Take 1 tablet (75 mg total) by mouth daily. 01/29/17  Yes BranchAlphonse Guild, MD  ferrous sulfate 325 (65 FE) MG tablet Take 325 mg by mouth daily with breakfast.   Yes [provider]  gabapentin (NEURONTIN) 300 MG capsule Take 2 capsules by mouth at bedtime.  01/04/15  Yes [provider]  Insulin Glargine (TOUJEO MAX SOLOSTAR) 300 UNIT/ML SOPN Inject 20 Units into the skin at bedtime. 12/13/16  Yes Nida, Marella Chimes, MD  levothyroxine (SYNTHROID, LEVOTHROID) 50 MCG tablet Take 1 tablet (50 mcg total) by mouth daily before breakfast. 02/14/17  Yes Nida, Marella Chimes, MD  metoprolol tartrate (LOPRESSOR) 25 MG tablet Take 12.5 mg by mouth 2 (two) times daily.   Yes [provider]  Multiple Vitamin (MULTIVITAMIN WITH MINERALS) TABS tablet Take 1 tablet by mouth daily.   Yes [provider]  pantoprazole (PROTONIX) 40 MG tablet Take 1 tablet (40 mg total) by mouth daily. [place]  minutes before breakfast 02/13/17  Yes Annitta Needs, NP  ULORIC 40 MG tablet Take 1 tablet by mouth daily. 12/21/14  Yes [provider]  furosemide (LASIX) 20 MG tablet Take 20 mg by mouth as needed. Only has to take it when she gains 2 lbs 09/20/16   [provider]  hydrALAZINE (APRESOLINE) 25 MG tablet Take 1 tablet (25 mg total) by mouth every 8 (eight) hours as needed. 02/18/17   [name], MD  Insulin Pen Needle (B-D  ULTRAFINE III SHORT PEN) 31G X 8 MM MISC 1 each by Does not apply route as directed. 12/13/16   Cassandria Anger, MD  Insulin Pen Needle (PEN NEEDLES) 32G X 6 MM MISC 1 each by Does not apply route at bedtime. 02/05/17   Cassandria Anger, MD  ondansetron (ZOFRAN) 4 MG tablet Take 1 tablet (4 mg total) by mouth every 8 (eight) hours as needed for nausea or vomiting. 10/25/16   [name], MD    Allergies as of 03/08/2017 - Review Complete 03/08/2017  Allergen Reaction Noted  . Insulin glargine Swelling 09/18/2016  . Statins Other (See Comments) 09/18/2016    Family History  Problem Relation Age of Onset  . Diabetes Mother   . Asthma Mother   . Early death Mother 48       Pneumonia  . Early death Father        Killed at rodeo  . CAD Neg Hx   . GI Bleed Neg Hx     Social History   Social History  . Marital status: Widowed    Spouse name: [name]  . Number of children: 2  . Years of education: 12   Occupational History  . retired    Social History Main Topics  . Smoking status: Never Smoker  . Smokeless tobacco: Never Used  . Alcohol use No  . Drug use: No  . Sexual activity: Not Currently   Other Topics Concern  . Not on file   Social History Narrative   Lives with significant other/partner [name]   He is her caregiver   Moved from [facility]    Review of Systems: See HPI, otherwise negative ROS   Physical Exam: BP 128/74 (BP Location: Left Arm)   Pulse 62   Temp 98.4 F (36.9 C) (Oral)   Resp 18   Ht 5\' 4"  (1.626 m)   Wt 181 lb 10.5 oz (82.4 kg)   SpO2 98%   BMI 31.18 kg/m  General:   Alert,  pleasant and cooperative in NAD Head:  Normocephalic and atraumatic. Neck:  Supple; Lungs:  Clear throughout to auscultation.    Heart:  Regular rate and rhythm. Abdomen:  Soft, nontender and nondistended. Normal bowel sounds, without guarding, and without rebound.   Neurologic:  Alert and  oriented x4;  grossly normal neurologically.  Impression/Plan:      Anemia/heme negative stools  PLAN:  1. EGD TODAY DISCUSSED PROCEDURE, BENEFITS, & RISKS: < 1% chance of medication reaction, bleeding, perforation, or rupture of spleen/liver.

## 2017-03-12 NOTE — Discharge Instructions (Signed)
STOP TAKING THE IRON TABLETS.  PLEASE FOLLOW UP AT HEMATOLOGY OFFICE ON 03/19/17 AT 3PM AS SCHEDULED TO HAVE LABS DONE RETURN IF SYMPTOMS RECUR, WORSEN OR NEW PROBLEMS DEVELOP   Follow with Primary MD  [name], MD  and other consultant's as instructed your Hospitalist MD  Please get a complete blood count and chemistry panel checked by your Primary MD at your next visit, and again as instructed by your Primary MD.  Get Medicines reviewed and adjusted: Please take all your medications with you for your next visit with your Primary MD  Laboratory/radiological data: Please request your Primary MD to go over all hospital tests and procedure/radiological results at the follow up, please ask your Primary MD to get all Hospital records sent to his/her office.  In some cases, they will be blood work, cultures and biopsy results pending at the time of your discharge. Please request that your primary care M.D. follows up on these results.  Also Note the following: If you experience worsening of your admission symptoms, develop shortness of breath, life threatening emergency, suicidal or homicidal thoughts you must seek medical attention immediately by calling 911 or calling your MD immediately  if symptoms less severe.  You must read complete instructions/literature along with all the possible adverse reactions/side effects for all the Medicines you take and that have been prescribed to you. Take any new Medicines after you have completely understood and accpet all the possible adverse reactions/side effects.   Do not drive when taking Pain medications or sleeping medications (Benzodaizepines)  Do not take more than prescribed Pain, Sleep and Anxiety Medications. It is not advisable to combine anxiety,sleep and pain medications without talking with your primary care practitioner  Special Instructions: If you have smoked or chewed Tobacco  in the last 2 yrs please stop smoking, stop any  regular Alcohol  and or any Recreational drug use.  Wear Seat belts while driving.  Please note: You were cared for by a hospitalist during your hospital stay. Once you are discharged, your primary care physician will handle any further medical issues. Please note that NO REFILLS for any discharge medications will be authorized once you are discharged, as it is imperative that you return to your primary care physician (or establish a relationship with a primary care physician if you do not have one) for your post hospital discharge needs so that they can reassess your need for medications and monitor your lab values.

## 2017-03-12 NOTE — Consult Note (Signed)
[facility] Consultation Oncology  Name: [name]      MRN: [id number]    Location: A306/A306-01  Date: 03/12/2017 Time:1:21 PM   REFERRING PHYSICIAN: Dr. Laural Golden  REASON FOR CONSULT:  Symptomatic anemia  DIAGNOSIS:  Symptomatic severe anemia  HISTORY OF PRESENT ILLNESS: Patient initially presented to the hospital for generalized weakness and slurred speech. On 03/08/2017 she's found to have a hemoglobin 4.8 g/dL. Baseline hemoglobin from 11/23/2016 was 10.3 g/dL, hematocrit 31.8%, MCV 89.1. Patient has had a total 4 units PRBC transfusion while she has been inpatient and her hemoglobin is 8.5 g/dL today. Patient had elevated troponin levels on admission which was thought to be due to demand ischemia, she was asymptomatic and did not have any chest pain or shortness of breath. Guaiac stool tests have been negative for blood. She had a EGD performed today which demonstrated mild gastritis, no source of bleeding found. I spoke with Dr. Oneida Alar on the phone today after she had her EGD, and Dr. Oneida Alar feels that a Givens capsule is not necessary at this time. Today patient states that she feels better. She does not have any chest pain, shortness breath, abdominal pain, focal weakness. Her speech is clear and not slurred.  Patient was following with [place] for her anemia of chronic renal disease and was last seen on 11/23/2016. Somehow she was lost to follow-up. She was supposed to start ESA therapy.  ROS as per HPI otherwise 12 point ROS is negative.   PAST MEDICAL HISTORY:   Past Medical History:  Diagnosis Date  . Anemia   . Arthritis   . Blood transfusion without reported diagnosis   . Cataract   . Chronic anemia   . Chronic combined systolic and diastolic CHF (congestive heart failure) ([place])    a. 2D echo 08/2016 at [facility]: EF 50-55% with inferior wall HK, impaired LV filling, fair study.  . CKD (chronic kidney disease), stage III ([place])   . Diabetes ([place])   . Gastritis   . GERD  (gastroesophageal reflux disease)   . Gout   . HTN (hypertension)   . Hyperlipidemia   . Iron deficiency anemia 11/08/2016  . Normocytic anemia 10/26/2016  . NSTEMI (non-ST elevated myocardial infarction) ([place])    a. Complex admission 08/2016 - with severe hyperglycemia, AKI on CKD, severe anemia down to Hgb 6.8, acute combined CHF, troponin of 8.5, cath deferred due to renal dysfunction.  . Thyroid disease     ALLERGIES: Allergies  Allergen Reactions  . Insulin Glargine Swelling    "Makes me swell like a balloon all over", including face, but without any respiratory distress or rashes. Associated with weight gain.  . Statins Other (See Comments)    "I've tried them all; my muscle aches were so bad I couldn't walk".      MEDICATIONS: I have reviewed the patient's current medications.     PAST SURGICAL HISTORY Past Surgical History:  Procedure Laterality Date  . COLONOSCOPY WITH PROPOFOL N/A 10/08/2016   Procedure: COLONOSCOPY WITH PROPOFOL;  Surgeon: Ronnette Juniper, MD;  Location: [place];  Service: Gastroenterology;  Laterality: N/A;  . ESOPHAGOGASTRODUODENOSCOPY N/A 10/06/2016   Procedure: ESOPHAGOGASTRODUODENOSCOPY (EGD);  Surgeon: Otis Brace, MD;  Location[facility] ENDOSCOPY;  Service: Gastroenterology;  Laterality: N/A;  . GIVENS CAPSULE STUDY  10/08/2016   Procedure: GIVENS CAPSULE STUDY;  Surgeon: Ronnette Juniper, MD;  Location: [place];  Service: Gastroenterology;;  . Freda Munro CAPSULE STUDY N/A 10/29/2016   Procedure: GIVENS CAPSULE STUDY;  Surgeon: Danie Binder, MD;  [name];  Service: Endoscopy;  Laterality: N/A;  . ORIF HUMERUS FRACTURE Left 02/07/2015   Procedure: OPEN REDUCTION INTERNAL FIXATION (ORIF) PROXIMAL HUMERUS FRACTURE;  Surgeon: Marybelle Killings, MD;  Location: [place];  Service: Orthopedics;  Laterality: Left;  . THYROID SURGERY    . TOTAL HIP ARTHROPLASTY Left 02/07/2015   Procedure: TOTAL HIP ARTHROPLASTY ANTERIOR APPROACH ;  Surgeon: Marybelle Killings,  MD;  Location: [place];  Service: Orthopedics;  Laterality: Left;  . WRIST SURGERY Left     FAMILY HISTORY: Family History  Problem Relation Age of Onset  . Diabetes Mother   . Asthma Mother   . Early death Mother 39       Pneumonia  . Early death Father        Killed at rodeo  . CAD Neg Hx   . GI Bleed Neg Hx     SOCIAL HISTORY:  reports that she has never smoked. She has never used smokeless tobacco. She reports that she does not drink alcohol or use drugs.   PHYSICAL EXAM: Most Recent Vital Signs: Blood pressure (!) 135/56, pulse 68, temperature 97.8 F (36.6 C), resp. rate 16, height '5\' 4"'  (1.626 m), weight 181 lb 10.5 oz (82.4 kg), SpO2 94 %. Constitutional: Well-developed, well-nourished, and in no distress.   HENT:  Head: Normocephalic and atraumatic.  Mouth/Throat: No oropharyngeal exudate. Mucosa moist. Eyes: Pupils are equal, round, and reactive to light. Conjunctivae are normal. No scleral icterus.  Neck: Normal range of motion. Neck supple. No JVD present.  Cardiovascular: Normal rate, regular rhythm and normal heart sounds.  Exam reveals no gallop and no friction rub.   No murmur heard. Pulmonary/Chest: Effort normal and breath sounds normal. No respiratory distress. No wheezes.No rales.  Abdominal: Soft. Bowel sounds are normal. No distension. There is no tenderness. There is no guarding.  Musculoskeletal: No edema or tenderness.  Lymphadenopathy:    No cervical or supraclavicular adenopathy.  Neurological: Alert and oriented to person, place, and time. No cranial nerve deficit.  Skin: Skin is warm and dry. No rash noted. No erythema. No pallor.  Psychiatric: Affect and judgment normal.    LABORATORY DATA:  Results for orders placed or performed during the hospital encounter of 03/08/17 (from the past 48 hour(s))  Glucose, capillary     Status: Abnormal   Collection Time: 03/10/17  4:44 PM  Result Value Ref Range   Glucose-Capillary 212 (H) 65 - 99 mg/dL   Glucose, capillary     Status: Abnormal   Collection Time: 03/10/17  9:39 PM  Result Value Ref Range   Glucose-Capillary 185 (H) 65 - 99 mg/dL   Comment 1 Notify RN    Comment 2 Document in Chart   CBC     Status: Abnormal   Collection Time: 03/11/17  6:28 AM  Result Value Ref Range   WBC 8.8 4.0 - 10.5 K/uL   RBC 2.45 (L) 3.87 - 5.11 MIL/uL   Hemoglobin 7.3 (L) 12.0 - 15.0 g/dL   HCT 22.8 (L) 36.0 - 46.0 %   MCV 93.1 78.0 - 100.0 fL   MCH 29.8 26.0 - 34.0 pg   MCHC 32.0 30.0 - 36.0 g/dL   RDW 17.3 (H) 11.5 - 15.5 %   Platelets 149 (L) 150 - 400 K/uL  Comprehensive metabolic panel     Status: Abnormal   Collection Time: 03/11/17  6:28 AM  Result Value Ref Range  Sodium 138 135 - 145 mmol/L   Potassium 3.8 3.5 - 5.1 mmol/L   Chloride 110 101 - 111 mmol/L   CO2 20 (L) 22 - 32 mmol/L   Glucose, Bld 219 (H) 65 - 99 mg/dL   BUN 87 (H) 6 - 20 mg/dL   Creatinine, Ser 2.44 (H) 0.44 - 1.00 mg/dL   Calcium 7.9 (L) 8.9 - 10.3 mg/dL   Total Protein 4.9 (L) 6.5 - 8.1 g/dL   Albumin 2.4 (L) 3.5 - 5.0 g/dL   AST 14 (L) 15 - 41 U/L   ALT 11 (L) 14 - 54 U/L   Alkaline Phosphatase 50 38 - 126 U/L   Total Bilirubin 0.6 0.3 - 1.2 mg/dL   GFR calc non Af Amer 18 (L) >60 mL/min   GFR calc Af Amer 21 (L) >60 mL/min    Comment: (NOTE) The eGFR has been calculated using the CKD EPI equation. This calculation has not been validated in all clinical situations. eGFR's persistently <60 mL/min signify possible Chronic Kidney Disease.    Anion gap 8 5 - 15  Glucose, capillary     Status: Abnormal   Collection Time: 03/11/17  7:51 AM  Result Value Ref Range   Glucose-Capillary 213 (H) 65 - 99 mg/dL   Comment 1 Notify RN    Comment 2 Document in Chart   Prepare RBC     Status: None   Collection Time: 03/11/17  9:30 AM  Result Value Ref Range   Order Confirmation ORDER PROCESSED BY BLOOD BANK   Occult blood card to lab, stool RN will collect     Status: None   Collection Time: 03/11/17 10:09  AM  Result Value Ref Range   Fecal Occult Bld NEGATIVE NEGATIVE  Glucose, capillary     Status: Abnormal   Collection Time: 03/11/17 11:48 AM  Result Value Ref Range   Glucose-Capillary 302 (H) 65 - 99 mg/dL   Comment 1 Notify RN    Comment 2 Document in Chart   Hemoglobin and hematocrit, blood     Status: Abnormal   Collection Time: 03/11/17  3:33 PM  Result Value Ref Range   Hemoglobin 8.0 (L) 12.0 - 15.0 g/dL   HCT 24.3 (L) 36.0 - 46.0 %  Glucose, capillary     Status: Abnormal   Collection Time: 03/11/17  4:45 PM  Result Value Ref Range   Glucose-Capillary 131 (H) 65 - 99 mg/dL  Glucose, capillary     Status: Abnormal   Collection Time: 03/11/17 10:22 PM  Result Value Ref Range   Glucose-Capillary 104 (H) 65 - 99 mg/dL  CBC     Status: Abnormal   Collection Time: 03/12/17  4:13 AM  Result Value Ref Range   WBC 7.0 4.0 - 10.5 K/uL   RBC 2.85 (L) 3.87 - 5.11 MIL/uL   Hemoglobin 8.5 (L) 12.0 - 15.0 g/dL   HCT 26.3 (L) 36.0 - 46.0 %   MCV 92.3 78.0 - 100.0 fL   MCH 29.8 26.0 - 34.0 pg   MCHC 32.3 30.0 - 36.0 g/dL   RDW 17.7 (H) 11.5 - 15.5 %   Platelets 260 150 - 400 K/uL  Comprehensive metabolic panel     Status: Abnormal   Collection Time: 03/12/17  4:13 AM  Result Value Ref Range   Sodium 142 135 - 145 mmol/L   Potassium 3.8 3.5 - 5.1 mmol/L   Chloride 110 101 - 111 mmol/L   CO2 22 22 -  32 mmol/L   Glucose, Bld 115 (H) 65 - 99 mg/dL   BUN 73 (H) 6 - 20 mg/dL   Creatinine, Ser 2.15 (H) 0.44 - 1.00 mg/dL   Calcium 8.4 (L) 8.9 - 10.3 mg/dL   Total Protein 5.1 (L) 6.5 - 8.1 g/dL   Albumin 2.4 (L) 3.5 - 5.0 g/dL   AST 13 (L) 15 - 41 U/L   ALT 11 (L) 14 - 54 U/L   Alkaline Phosphatase 52 38 - 126 U/L   Total Bilirubin 0.4 0.3 - 1.2 mg/dL   GFR calc non Af Amer 21 (L) >60 mL/min   GFR calc Af Amer 24 (L) >60 mL/min    Comment: (NOTE) The eGFR has been calculated using the CKD EPI equation. This calculation has not been validated in all clinical situations. eGFR's  persistently <60 mL/min signify possible Chronic Kidney Disease.    Anion gap 10 5 - 15  Glucose, capillary     Status: Abnormal   Collection Time: 03/12/17  8:03 AM  Result Value Ref Range   Glucose-Capillary 153 (H) 65 - 99 mg/dL   Comment 1 Notify RN   Glucose, capillary     Status: Abnormal   Collection Time: 03/12/17 11:09 AM  Result Value Ref Range   Glucose-Capillary 105 (H) 65 - 99 mg/dL  Glucose, capillary     Status: None   Collection Time: 03/12/17 11:56 AM  Result Value Ref Range   Glucose-Capillary 99 65 - 99 mg/dL      ASSESSMENT:  1. Symptomatic anemia with hemoglobin of 4.8 g/dL on admission. S/p 4 units pRBC transfusion. No source of bleeding found on EGD.  2. Mild iron deficiency. Ferritin elevated as an acute phase reactant. 3. Anemia of CKD stage IV  PLAN: -Patient has outpatient follow up with [place] on 03/19/17 at 3 pm. Will discuss with the patient at that time whether she will want to proceed with a bone marrow biopsy since there is no clear cause of why her hemoglobin dropped down so low other than her baseline anemia from CKD. If bone marrow biopsy negative, will start ESA therapy on the patient. -Will repeat labs on her next outpatient visit.  -No further recommendations while inpatient.   All questions were answered. The patient knows to call the clinic with any problems, questions or concerns. We can certainly see the patient much sooner if necessary.  Twana First

## 2017-03-12 NOTE — Progress Notes (Signed)
Patient scheduled for EGD today, +/- capsule as appropriate. Has been NPO since midnight. Received total of 5 units PRBCs this admission. Hgb improved after 1 unit PRBCs yesterday from 7.3 to 8.5. EGD with Propofol today with Dr. Oneida Alar.

## 2017-03-12 NOTE — Anesthesia Postprocedure Evaluation (Signed)
Anesthesia Post Note  Patient: [name]  Procedure(s) Performed: ESOPHAGOGASTRODUODENOSCOPY (EGD) WITH PROPOFOL (N/A )  Patient location during evaluation: PACU Anesthesia Type: MAC Level of consciousness: awake and alert Pain management: satisfactory to patient Vital Signs Assessment: post-procedure vital signs reviewed and stable Respiratory status: spontaneous breathing Cardiovascular status: stable Postop Assessment: no apparent nausea or vomiting Anesthetic complications: no     Last Vitals:  Vitals:   03/12/17 1200 03/12/17 1215  BP: (!) 124/53 (!) 135/56  Pulse: 64 68  Resp: 13 16  Temp:    SpO2: 92% 94%    Last Pain:  Vitals:   03/12/17 1048  TempSrc: Oral  PainSc:                  Drucie Opitz

## 2017-03-12 NOTE — Anesthesia Preprocedure Evaluation (Signed)
Anesthesia Evaluation  Patient identified by MRN, date of birth, ID band Patient awake    Reviewed: Allergy & Precautions, NPO status , Patient's Chart, lab work & pertinent test results  Airway Mallampati: III  TM Distance: <3 FB Neck ROM: Full    Dental  (+) Edentulous Upper, Partial Lower   Pulmonary neg pulmonary ROS,    Pulmonary exam normal        Cardiovascular hypertension, Pt. on medications + Past MI and +CHF  Normal cardiovascular exam     Neuro/Psych negative neurological ROS     GI/Hepatic GERD  ,  Endo/Other  diabetes, Type 2, Insulin Dependent, Oral Hypoglycemic AgentsHypothyroidism   Renal/GU CRFRenal disease     Musculoskeletal   Abdominal   Peds  Hematology  (+) Blood dyscrasia, anemia ,   Anesthesia Other Findings Tx'x 5 PRBC's  Reproductive/Obstetrics                             Anesthesia Physical Anesthesia Plan  ASA: III  Anesthesia Plan: MAC   Post-op Pain Management:    Induction: Intravenous  PONV Risk Score and Plan:   Airway Management Planned: Simple Face Mask  Additional Equipment:   Intra-op Plan:   Post-operative Plan:   Informed Consent: I have reviewed the patients History and Physical, chart, labs and discussed the procedure including the risks, benefits and alternatives for the proposed anesthesia with the patient or authorized representative who has indicated his/her understanding and acceptance.     Plan Discussed with:   Anesthesia Plan Comments:         Anesthesia Quick Evaluation

## 2017-03-12 NOTE — Discharge Summary (Addendum)
Physician Discharge Summary  [name] OIT:[id number] DOB: 1940-08-06 DOA: 03/08/2017  PCP: [name], MD GI: Dr. Oneida Alar Hematologist: Dr. Talbert Cage  Admit date: 03/08/2017 Discharge date: 03/12/2017  Admitted From: Home  Disposition: Home  Recommendations for Outpatient Follow-up:  1. Follow up with PCP in 2 weeks 2. Follow up with hematologist in 1 week on 03/19/17 at 3 pm as scheduled 3. Follow up with endocrinologist in 2 weeks 4. Follow up with Dr. Oneida Alar in 2 months 5. Please obtain BMP/CBC in one week 6. Make an outpatient decision about restarting plavix.  7. Please follow up on the following pending results: EGD biopsy results  Discharge Condition: STABLE   CODE STATUS: FULL    Brief Hospitalization Summary: Please see all hospital notes, images, labs for full details of the hospitalization.  HPI: [name] is a 76 y.o. female with medical history significant of GI bleeding, gastritis, hypertension, diabetes mellitus, GERD, hypothyroidism, gout, CAD, iron deficiency anemia, CKD-4, CAD, NSTEMI, who presents with generalized weakness, slurred speech.  Patient states that she has been having generalized weakness in the past 48 hours, which has been progressively getting worse. She had slurred speech for several hours intermittently yesterday, which has resolved today. Patient does not have unilateral numbness, tingliness in extremities. No vision change, facial droop or hearing loss. She did not notice any blood in the stool, but per her friend, patient had dark stool recently on wiping. Patient denies any nausea, vomiting, diarrhea, abdominal pain. She does not have symptoms of UTI, fever, chills, chest pain, shortness of breath, cough.  Of note, pt was hospitalized from 05/31-11/26/16 due to severe anemia. Her hgb was 3.8 and transfused with 4 units of blood. Pt had EGD and colonoscopy on 10/08/2016. She was found to have gastritis and 15 mm polyp in the  transverse colon and resection was not attempted due to anticoagulation. She was also found to have diverticulosis in sigmoid colon and nonbleeding internal hemorrhoids. She also had camera capsule study on 10/08/2016 showing no mucosal abnormality within the small bowel. In that admission, GI recommended oral iron and follow-up hematology as an outpatient. Her aspirin and the Plavix were on hold. plavix was restarted 2 weeks ago. Pt states that she also had slurred speech when she had severe anemia in May.   ED Course: pt was found to have hemoglobin dropped from 10.3 on 11/23/16-4.8, WBC 14.1, troponin 3.97 which was 1.36 on 60/1/18, INR 1.24, negative urinalysis, pending FOBT. slightly worsening renal function, temperature 99, no tachycardia, no tachypnea, oxygen saturation 94% on room air, negative chest x-ray. CT head is negative for acute intracranial abnormalities. Patient is admitted to stepdown as inpatient.  Brief Admission Hx: [name] a 76 y.o.femalewith medical history significant ofGI bleeding, gastritis,hypertension, diabetes mellitus, GERD, hypothyroidism, gout, CAD, iron deficiency anemia, CKD-4, CAD, NSTEMI, who presents with generalized weakness, slurred speech.  pt was found to have hemoglobin dropped from 10.3 on 11/23/16 to 4.8.  She was admitted with severe anemia.    MDM/Assessment & Plan:   1. Severe symptomatic anemia - Pt is ordered to have 4 units PRBC transfused.  She received an additional 1 unit on 03/11/17.  Hg is 8.5.   GI consult.  FOBTnegative.  Pt has had extensive GI work up with past admissions for this recurrent problem.  She was recently restarted on plavix 2 weeks ago.  IV protonix.  Pt is followed by hematology and gets iron transfusions. Transfer to telemetry.  Hemoccults negative.  EGD done 10/16 did not find any source of bleeding.  Stop oral iron recommended by GI. Hematologist consulted and recommendations are below.  Pt will need bone marrow  biopsy and possibly start epo.  Pt to follow up in 1 week with hematology as scheduled 03/19/17 at 3 pm.   2. Mental Status changes - slurred speech completely resolved, CT head negative for acute findings.  MRI brain no acute findings.    3. Elevated troponin - spoke with cardiology who recommended no intervention at this time, suspect demand ischemia secondary to severe anemia. Trop trending down. Pt having no symptoms. Echo: EF 60-65% no regional wall abnormalities seen.  4. Stage 4 CKD - avoid nephrotoxins as much as possible and renally dose medications.  Follow renal function closely.  5. Diabetes mellitus type 2 - Adjusting therapy and increasing supplemental coverage.  Follow up with Dr. Dorris Fetch outpatient.  6. Hypothyroidism - resume home levothyroxine. 7. Hypertension - resume home meds at discharge.   8. Fever - suspect was related to blood transfusions, no recurrence. No infection found.Tylenol ordered as needed.   DVT prophylaxis: SCDs Code Status: FULL  Family Communication:  Disposition Plan: Home   GI recommendations Recommendation:  await pathology results.                           - Soft diet and diabetic (ADA) diet.                           - Continue present medications. STOP ORAL IRON.                            IVFE IF NEEDED. CONSIDER BONE MARROW BIOPSY.                           - Return to my office in 2 months.  Hematology recommendations: ASSESSMENT:  1. Symptomatic anemia with hemoglobin of 4.8 g/dL on admission. S/p 4 units pRBC transfusion. No source of bleeding found on EGD.  2. Mild iron deficiency. Ferritin elevated as an acute phase reactant. 3. Anemia of CKD stage IV  PLAN: -Patient has outpatient follow up with [place] on 03/19/17 at 3 pm. Will discuss with the patient at that time whether she will want to proceed with a bone marrow biopsy since there is no clear cause of why her hemoglobin dropped down so low other than her baseline anemia from CKD. If  bone marrow biopsy negative, will start ESA therapy on the patient. -Will repeat labs on her next outpatient visit.  -No further recommendations while inpatient.   All questions were answered. The patient knows to call the clinic with any problems, questions or concerns. We can certainly see the patient much sooner if necessary.  Twana First   Discharge Diagnoses:  Principal Problem:   Symptomatic anemia Active Problems:   Diabetes mellitus with stage 4 chronic kidney disease (HCC)   Gout   Chronic kidney disease, stage IV (severe) (HCC)   Elevated troponin   Iron deficiency anemia   Essential hypertension, benign   Hypothyroidism   Slurred speech   Acute on chronic anemia    Discharge Instructions: Discharge Instructions    Call MD for:  difficulty breathing, headache or visual disturbances    Complete by:  As directed  Call MD for:  extreme fatigue    Complete by:  As directed    Call MD for:  persistant dizziness or light-headedness    Complete by:  As directed    Increase activity slowly    Complete by:  As directed      Allergies as of 03/12/2017      Reactions   Insulin Glargine Swelling   "Makes me swell like a balloon all over", including face, but without any respiratory distress or rashes. Associated with weight gain.   Statins Other (See Comments)   "I've tried them all; my muscle aches were so bad I couldn't walk".      Medication List    STOP taking these medications   clopidogrel 75 MG tablet Commonly known as:  PLAVIX   ferrous sulfate 325 (65 FE) MG tablet     TAKE these medications   amLODipine 10 MG tablet Commonly known as:  NORVASC Take 1 tablet (10 mg total) by mouth daily.   furosemide 20 MG tablet Commonly known as:  LASIX Take 20 mg by mouth as needed. Only has to take it when she gains 2 lbs   gabapentin 300 MG capsule Commonly known as:  NEURONTIN Take 2 capsules by mouth at bedtime.   hydrALAZINE 25 MG tablet Commonly  known as:  APRESOLINE Take 1 tablet (25 mg total) by mouth every 8 (eight) hours as needed.   Insulin Glargine 300 UNIT/ML Sopn Commonly known as:  TOUJEO MAX SOLOSTAR Inject 20 Units into the skin at bedtime.   Insulin Pen Needle 31G X 8 MM Misc Commonly known as:  B-D ULTRAFINE III SHORT PEN 1 each by Does not apply route as directed.   Pen Needles 32G X 6 MM Misc 1 each by Does not apply route at bedtime.   levothyroxine 50 MCG tablet Commonly known as:  SYNTHROID, LEVOTHROID Take 1 tablet (50 mcg total) by mouth daily before breakfast.   metoprolol tartrate 25 MG tablet Commonly known as:  LOPRESSOR Take 12.5 mg by mouth 2 (two) times daily.   multivitamin with minerals Tabs tablet Take 1 tablet by mouth daily.   ondansetron 4 MG tablet Commonly known as:  ZOFRAN Take 1 tablet (4 mg total) by mouth every 8 (eight) hours as needed for nausea or vomiting.   pantoprazole 40 MG tablet Commonly known as:  PROTONIX Take 1 tablet (40 mg total) by mouth daily. 30 minutes before breakfast   ULORIC 40 MG tablet Generic drug:  febuxostat Take 1 tablet by mouth daily.      Follow-up Information    [name], MD. Schedule an appointment as soon as possible for a visit in 2 week(s).   Specialty:  Family Medicine Why:  Hospital Follow Up  Contact information: [address] [place] [address] [phone]        Twana First, MD. Go on 03/19/2017.   Specialty:  Oncology Why:  As scheduled at 3 pm  Contact information: [place] [address] [phone]        Cassandria Anger, MD. Schedule an appointment as soon as possible for a visit in 2 week(s).   Specialty:  Endocrinology Contact information: [place] [address] [phone]        Danie Binder, MD. Schedule an appointment as soon as possible for a visit in 2 month(s).   Specialty:  Gastroenterology Why:  Hospital Follow Up, biopsy results  Contact information: [place] [address] [phone]          Allergies  Allergen Reactions  . Insulin Glargine Swelling    "Makes me swell like a balloon all over", including face, but without any respiratory distress or rashes. Associated with weight gain.  . Statins Other (See Comments)    "I've tried them all; my muscle aches were so bad I couldn't walk".   Current Discharge Medication List    CONTINUE these medications which have NOT CHANGED   Details  amLODipine (NORVASC) 10 MG tablet Take 1 tablet (10 mg total) by mouth daily. Qty: 90 tablet, Refills: 3    gabapentin (NEURONTIN) 300 MG capsule Take 2 capsules by mouth at bedtime.     Insulin Glargine (TOUJEO MAX SOLOSTAR) 300 UNIT/ML SOPN Inject 20 Units into the skin at bedtime. Qty: 3 pen, Refills: 2    levothyroxine (SYNTHROID, LEVOTHROID) 50 MCG tablet Take 1 tablet (50 mcg total) by mouth daily before breakfast. Qty: 30 tablet, Refills: 3    metoprolol tartrate (LOPRESSOR) 25 MG tablet Take 12.5 mg by mouth 2 (two) times daily.    Multiple Vitamin (MULTIVITAMIN WITH MINERALS) TABS tablet Take 1 tablet by mouth daily.    pantoprazole (PROTONIX) 40 MG tablet Take 1 tablet (40 mg total) by mouth daily. 30 minutes before breakfast Qty: 90 tablet, Refills: 3    ULORIC 40 MG tablet Take 1 tablet by mouth daily.    furosemide (LASIX) 20 MG tablet Take 20 mg by mouth as needed. Only has to take it when she gains 2 lbs    hydrALAZINE (APRESOLINE) 25 MG tablet Take 1 tablet (25 mg total) by mouth every 8 (eight) hours as needed. Qty: 30 tablet, Refills: 3    !! Insulin Pen Needle (B-D ULTRAFINE III SHORT PEN) 31G X 8 MM MISC 1 each by Does not apply route as directed. Qty: 50 each, Refills: 3    !! Insulin Pen Needle (PEN NEEDLES) 32G X 6 MM MISC 1 each by Does not apply route at bedtime. Qty: 100 each, Refills: 5    ondansetron (ZOFRAN) 4 MG tablet Take 1 tablet (4 mg total) by mouth every 8  (eight) hours as needed for nausea or vomiting. Qty: 20 tablet, Refills: 1     !! - Potential duplicate medications found. Please discuss with provider.    STOP taking these medications     clopidogrel (PLAVIX) 75 MG tablet      ferrous sulfate 325 (65 FE) MG tablet         Procedures/Studies: Dg Chest 2 View  Result Date: 03/08/2017 CLINICAL DATA:  Weakness, onset of generalized weakness 48 hours ago, some slurred speech 24 hours ago intermittently, history CHF, diabetes mellitus, hypertension, NSTEMI, chronic kidney disease EXAM: CHEST  2 VIEW COMPARISON:  10/27/2016 FINDINGS: Enlargement of cardiac silhouette. Mediastinal contours and pulmonary vascularity normal. Lungs clear. No acute infiltrate, pleural effusion or pneumothorax. Bones demineralized with prior proximal LEFT humeral ORIF. IMPRESSION: Enlargement of cardiac silhouette. No acute abnormalities. Electronically Signed   By: Lavonia Dana M.D.   On: 03/08/2017 17:44   Ct Head Wo Contrast  Result Date: 03/08/2017 CLINICAL DATA:  Slurred speech, weakness. EXAM: CT HEAD WITHOUT CONTRAST TECHNIQUE: Contiguous axial images were obtained from the base of the skull through the vertex without intravenous contrast. COMPARISON:  CT scan of Oct 25, 2016. FINDINGS: Brain: Mild chronic ischemic white matter disease. No mass effect or midline shift is noted.  Ventricular size is within normal limits. There is no evidence of mass lesion, hemorrhage or acute infarction. Vascular: No hyperdense vessel or unexpected calcification. Skull: Normal. Negative for fracture or focal lesion. Sinuses/Orbits: No acute finding. Other: None. IMPRESSION: Mild chronic ischemic white matter disease. No acute intracranial abnormality seen. Electronically Signed   By: Marijo Conception, M.D.   On: 03/08/2017 17:20   Mr Brain Wo Contrast  Result Date: 03/09/2017 CLINICAL DATA:  TIA.  Patient admitted with slurred speech EXAM: MRI HEAD WITHOUT CONTRAST TECHNIQUE:  Multiplanar, multiecho pulse sequences of the brain and surrounding structures were obtained without intravenous contrast. COMPARISON:  Head CT from yesterday FINDINGS: Brain: No acute infarction, hemorrhage, hydrocephalus, extra-axial collection or mass lesion. Overall mild chronic microvascular ischemic gliosis in the deep cerebral white matter. 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 Age normal brain volume. Vascular: Major flow voids are preserved Skull and upper cervical spine: No evidence of marrow lesion. Sinuses/Orbits: Left cataract resection.  No acute finding IMPRESSION: 1. No acute finding including infarct. 2. Mild chronic small vessel ischemia in the cerebral white matter. 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 Electronically Signed   By: Monte Fantasia M.D.   On: 03/09/2017 10:09   Dg Chest Port 1 View  Result Date: 03/10/2017 CLINICAL DATA:  Fever, leukocytosis EXAM: PORTABLE CHEST 1 VIEW COMPARISON:  03/08/2017 FINDINGS: Pulmonary vascular congestion. No frank interstitial edema. No pleural effusion or pneumothorax. Cardiomegaly. Postsurgical changes involving the left proximal humerus, incompletely visualized. IMPRESSION: Cardiomegaly with pulmonary vascular congestion. No frank interstitial edema. Electronically Signed   By: Julian Hy M.D.   On: 03/10/2017 07:38     Subjective: Pt feels much better today.  She would really like to go home. She is eating well.    Discharge Exam: Vitals:   03/12/17 1200 03/12/17 1215  BP: (!) 124/53 (!) 135/56  Pulse: 64 68  Resp: 13 16  Temp:    SpO2: 92% 94%   Vitals:   03/12/17 1120 03/12/17 1155 03/12/17 1200 03/12/17 1215  BP:  (!) 110/55 (!) 124/53 (!) 135/56  Pulse:  64 64 68  Resp: _0 Temp:  97.8 F (36.6 C)    TempSrc:      SpO2: 92% 95% 92% 94%  Weight:      Height:       General: Pt is alert, awake, not in acute distress Cardiovascular: RRR, S1/S2 +, no rubs, no  gallops Respiratory: CTA bilaterally, no wheezing, no rhonchi Abdominal: Soft, NT, ND, bowel sounds + Extremities: no edema, no cyanosis   The results of significant diagnostics from this hospitalization (including imaging, microbiology, ancillary and laboratory) are listed below for reference.     Microbiology: Recent Results (from the past 240 hour(s))  MRSA PCR Screening     Status: None   Collection Time: 03/08/17  9:20 PM  Result Value Ref Range Status   MRSA by PCR NEGATIVE NEGATIVE Final    Comment:        The GeneXpert MRSA Assay (FDA approved for NASAL specimens only), is one component of a comprehensive MRSA colonization surveillance program. It is not intended to diagnose MRSA infection nor to guide or monitor treatment for MRSA infections.   Culture, blood (Routine X 2) w Reflex to ID Panel     Status: None (Preliminary result)   Collection Time: 03/09/17  8:23 AM  Result Value Ref Range Status   Specimen Description BLOOD LEFT  FOREARM  Final   Special Requests   Final    BOTTLES DRAWN AEROBIC AND ANAEROBIC Blood Culture adequate volume   Culture NO GROWTH 3 DAYS  Final   Report Status PENDING  Incomplete  Culture, blood (Routine X 2) w Reflex to ID Panel     Status: None (Preliminary result)   Collection Time: 03/09/17  8:23 AM  Result Value Ref Range Status   Specimen Description BLOOD LEFT ARM  Final   Special Requests   Final    BOTTLES DRAWN AEROBIC AND ANAEROBIC Blood Culture adequate volume   Culture NO GROWTH 3 DAYS  Final   Report Status PENDING  Incomplete     Labs: BNP (last 3 results)  Recent Labs  03/08/17 2321  BNP 1,610.9*   Basic Metabolic Panel:  Recent Labs Lab 03/08/17 1527 03/09/17 0824 03/10/17 0450 03/11/17 0628 03/12/17 0413  NA 138 140 141 138 142  K 4.2 4.0 3.8 3.8 3.8  CL 104 107 110 110 110  CO2 21* 21* 22 20* 22  GLUCOSE 318* 320* 171* 219* 115*  BUN 96* 91* 91* 87* 73*  CREATININE 3.02* 2.78* 2.46* 2.44* 2.15*   CALCIUM 8.4* 8.0* 7.9* 7.9* 8.4*   Liver Function Tests:  Recent Labs Lab 03/08/17 1527 03/10/17 0450 03/11/17 0628 03/12/17 0413  AST 26 14* 14* 13*  ALT 11* 12* 11* 11*  ALKPHOS 56 43 50 52  BILITOT 0.3 0.5 0.6 0.4  PROT 5.8* 4.9* 4.9* 5.1*  ALBUMIN 3.0* 2.4* 2.4* 2.4*   No results for input(s): LIPASE, AMYLASE in the last 168 hours. No results for input(s): AMMONIA in the last 168 hours. CBC:  Recent Labs Lab 03/08/17 1527 03/08/17 2321 03/09/17 0824 03/10/17 0450 03/11/17 0628 03/11/17 1533 03/12/17 0413  WBC 14.1* 17.5* 16.3* 10.9* 8.8  --  7.0  NEUTROABS 11.9*  --   --   --   --   --   --   HGB 4.8* 6.3* 8.4* 8.1* 7.3* 8.0* 8.5*  HCT 14.6* 19.5* 25.3* 24.3* 22.8* 24.3* 26.3*  MCV 94.8 93.3 91.3 91.0 93.1  --  92.3  PLT 302 322 252 214 149*  --  260   Cardiac Enzymes:  Recent Labs Lab 03/08/17 1527 03/08/17 2321 03/09/17 0823 03/09/17 2026  TROPONINI 3.97* 3.06* 3.58* 2.06*   BNP: Invalid input(s): POCBNP CBG:  Recent Labs Lab 03/11/17 1645 03/11/17 2222 03/12/17 0803 03/12/17 1109 03/12/17 1156  GLUCAP 131* 104* 153* 105* 99   D-Dimer No results for input(s): DDIMER in the last 72 hours. Hgb A1c No results for input(s): HGBA1C in the last 72 hours. Lipid Profile No results for input(s): CHOL, HDL, LDLCALC, TRIG, CHOLHDL, LDLDIRECT in the last 72 hours. Thyroid function studies No results for input(s): TSH, T4TOTAL, T3FREE, THYROIDAB in the last 72 hours.  Invalid input(s): FREET3 Anemia work up No results for input(s): VITAMINB12, FOLATE, FERRITIN, TIBC, IRON, RETICCTPCT in the last 72 hours. Urinalysis    Component Value Date/Time   COLORURINE YELLOW 03/09/2017 0801   APPEARANCEUR CLEAR 03/09/2017 0801   LABSPEC 1.013 03/09/2017 0801   PHURINE 5.0 03/09/2017 0801   GLUCOSEU 50 (A) 03/09/2017 0801   HGBUR NEGATIVE 03/09/2017 0801   BILIRUBINUR NEGATIVE 03/09/2017 0801   KETONESUR NEGATIVE 03/09/2017 0801   PROTEINUR 30 (A)  03/09/2017 0801   UROBILINOGEN 0.2 02/08/2015 1615   NITRITE NEGATIVE 03/09/2017 0801   LEUKOCYTESUR NEGATIVE 03/09/2017 0801   Sepsis Labs Invalid input(s): PROCALCITONIN,  WBC,  [place] [facility]  Recent Results (from the past 240 hour(s))  MRSA PCR Screening     Status: None   Collection Time: 03/08/17  9:20 PM  Result Value Ref Range Status   MRSA by PCR NEGATIVE NEGATIVE Final    Comment:        The GeneXpert MRSA Assay (FDA approved for NASAL specimens only), is one component of a comprehensive MRSA colonization surveillance program. It is not intended to diagnose MRSA infection nor to guide or monitor treatment for MRSA infections.   Culture, blood (Routine X 2) w Reflex to ID Panel     Status: None (Preliminary result)   Collection Time: 03/09/17  8:23 AM  Result Value Ref Range Status   Specimen Description BLOOD LEFT FOREARM  Final   Special Requests   Final    BOTTLES DRAWN AEROBIC AND ANAEROBIC Blood Culture adequate volume   Culture NO GROWTH 3 DAYS  Final   Report Status PENDING  Incomplete  Culture, blood (Routine X 2) w Reflex to ID Panel     Status: None (Preliminary result)   Collection Time: 03/09/17  8:23 AM  Result Value Ref Range Status   Specimen Description BLOOD LEFT ARM  Final   Special Requests   Final    BOTTLES DRAWN AEROBIC AND ANAEROBIC Blood Culture adequate volume   Culture NO GROWTH 3 DAYS  Final   Report Status PENDING  Incomplete   Time coordinating discharge: 31 minutes  SIGNED:  Irwin Brakeman, MD  [facility] 03/12/2017, 2:24 PM Pager [phone]  If 7PM-7AM, please contact night-coverage [email or web address] Password [email or web address]

## 2017-03-12 NOTE — Progress Notes (Signed)
Pt NPO after midnight, no food or PO fluids given.

## 2017-03-13 ENCOUNTER — Encounter: Payer: Self-pay | Admitting: "Endocrinology

## 2017-03-13 ENCOUNTER — Encounter: Payer: Self-pay | Admitting: Family Medicine

## 2017-03-14 ENCOUNTER — Encounter (HOSPITAL_COMMUNITY): Payer: Medicare HMO | Attending: Oncology

## 2017-03-14 ENCOUNTER — Other Ambulatory Visit (HOSPITAL_COMMUNITY): Payer: Self-pay

## 2017-03-14 ENCOUNTER — Other Ambulatory Visit: Payer: Self-pay | Admitting: *Deleted

## 2017-03-14 ENCOUNTER — Encounter (HOSPITAL_COMMUNITY): Payer: Self-pay | Admitting: Gastroenterology

## 2017-03-14 DIAGNOSIS — D508 Other iron deficiency anemias: Secondary | ICD-10-CM

## 2017-03-14 DIAGNOSIS — D509 Iron deficiency anemia, unspecified: Secondary | ICD-10-CM | POA: Insufficient documentation

## 2017-03-14 DIAGNOSIS — D649 Anemia, unspecified: Secondary | ICD-10-CM | POA: Diagnosis not present

## 2017-03-14 DIAGNOSIS — N184 Chronic kidney disease, stage 4 (severe): Secondary | ICD-10-CM | POA: Diagnosis not present

## 2017-03-14 DIAGNOSIS — E1122 Type 2 diabetes mellitus with diabetic chronic kidney disease: Secondary | ICD-10-CM | POA: Insufficient documentation

## 2017-03-14 DIAGNOSIS — I129 Hypertensive chronic kidney disease with stage 1 through stage 4 chronic kidney disease, or unspecified chronic kidney disease: Secondary | ICD-10-CM | POA: Insufficient documentation

## 2017-03-14 LAB — CBC WITH DIFFERENTIAL/PLATELET
BASOS PCT: 0 %
Basophils Absolute: 0 10*3/uL (ref 0.0–0.1)
EOS PCT: 3 %
Eosinophils Absolute: 0.2 10*3/uL (ref 0.0–0.7)
HEMATOCRIT: 20.9 % — AB (ref 36.0–46.0)
Hemoglobin: 6.7 g/dL — CL (ref 12.0–15.0)
LYMPHS PCT: 14 %
Lymphs Abs: 1.1 10*3/uL (ref 0.7–4.0)
MCH: 30 pg (ref 26.0–34.0)
MCHC: 32.1 g/dL (ref 30.0–36.0)
MCV: 93.7 fL (ref 78.0–100.0)
MONO ABS: 0.4 10*3/uL (ref 0.1–1.0)
MONOS PCT: 5 %
NEUTROS ABS: 6.2 10*3/uL (ref 1.7–7.7)
Neutrophils Relative %: 79 %
Platelets: 313 10*3/uL (ref 150–400)
RBC: 2.23 MIL/uL — ABNORMAL LOW (ref 3.87–5.11)
RDW: 17.9 % — ABNORMAL HIGH (ref 11.5–15.5)
WBC: 7.8 10*3/uL (ref 4.0–10.5)

## 2017-03-14 LAB — CULTURE, BLOOD (ROUTINE X 2)
CULTURE: NO GROWTH
Culture: NO GROWTH
Special Requests: ADEQUATE
Special Requests: ADEQUATE

## 2017-03-14 LAB — COMPREHENSIVE METABOLIC PANEL
ALT: 13 U/L — ABNORMAL LOW (ref 14–54)
ANION GAP: 8 (ref 5–15)
AST: 28 U/L (ref 15–41)
Albumin: 2.7 g/dL — ABNORMAL LOW (ref 3.5–5.0)
Alkaline Phosphatase: 62 U/L (ref 38–126)
BILIRUBIN TOTAL: 0.3 mg/dL (ref 0.3–1.2)
BUN: 57 mg/dL — ABNORMAL HIGH (ref 6–20)
CO2: 21 mmol/L — ABNORMAL LOW (ref 22–32)
Calcium: 8 mg/dL — ABNORMAL LOW (ref 8.9–10.3)
Chloride: 109 mmol/L (ref 101–111)
Creatinine, Ser: 1.98 mg/dL — ABNORMAL HIGH (ref 0.44–1.00)
GFR, EST AFRICAN AMERICAN: 27 mL/min — AB (ref >60)
GFR, EST NON AFRICAN AMERICAN: 23 mL/min — AB (ref >60)
Glucose, Bld: 252 mg/dL — ABNORMAL HIGH (ref 65–99)
POTASSIUM: 4 mmol/L (ref 3.5–5.1)
Sodium: 138 mmol/L (ref 135–145)
TOTAL PROTEIN: 5.3 g/dL — AB (ref 6.5–8.1)

## 2017-03-14 LAB — IRON AND TIBC
IRON: 35 ug/dL (ref 28–170)
SATURATION RATIOS: 15 % (ref 10.4–31.8)
TIBC: 231 ug/dL — AB (ref 250–450)
UIBC: 196 ug/dL

## 2017-03-14 LAB — PREPARE RBC (CROSSMATCH)

## 2017-03-14 LAB — FERRITIN: Ferritin: 324 ng/mL — ABNORMAL HIGH (ref 11–307)

## 2017-03-14 NOTE — Patient Outreach (Signed)
[place] [facility]) Care Management  03/14/2017  [name] Oct 30, 1940 [id number]  Referral from [place]; patient discharged from [facility] 03/12/2017[ determine if transition needs:  Patient's primary care provider is completing "TOC". MD office to refer to [facility] if needed.  Telephone call to nurse in primary office; message left advising of patient's discharge.   Plan: Will close case.  Sherrin Daisy, RN BSN [place] Management Coordinator [facility] Care Management  [phone]

## 2017-03-14 NOTE — Progress Notes (Signed)
CRITICAL VALUE ALERT Critical value received:  Hgb - 6.7 Date of notification:  03/14/17 Time of notification: 1312 Critical value read back:  Yes.   Nurse who received alert:  J.Cagle,RN MD notified (1st page):  L.Zhou,MD  Patient is scheduled for blood transfusion tomorrow, Friday, 03/15/17.

## 2017-03-15 ENCOUNTER — Encounter (HOSPITAL_BASED_OUTPATIENT_CLINIC_OR_DEPARTMENT_OTHER): Payer: Medicare HMO

## 2017-03-15 ENCOUNTER — Telehealth: Payer: Self-pay | Admitting: Family Medicine

## 2017-03-15 ENCOUNTER — Encounter (HOSPITAL_COMMUNITY): Payer: Self-pay

## 2017-03-15 DIAGNOSIS — N184 Chronic kidney disease, stage 4 (severe): Secondary | ICD-10-CM | POA: Diagnosis not present

## 2017-03-15 DIAGNOSIS — D509 Iron deficiency anemia, unspecified: Secondary | ICD-10-CM | POA: Diagnosis not present

## 2017-03-15 DIAGNOSIS — D649 Anemia, unspecified: Secondary | ICD-10-CM

## 2017-03-15 DIAGNOSIS — I129 Hypertensive chronic kidney disease with stage 1 through stage 4 chronic kidney disease, or unspecified chronic kidney disease: Secondary | ICD-10-CM | POA: Diagnosis not present

## 2017-03-15 DIAGNOSIS — Z23 Encounter for immunization: Secondary | ICD-10-CM

## 2017-03-15 DIAGNOSIS — E1122 Type 2 diabetes mellitus with diabetic chronic kidney disease: Secondary | ICD-10-CM | POA: Diagnosis not present

## 2017-03-15 MED ORDER — INFLUENZA VAC SPLIT QUAD 0.5 ML IM SUSY
0.5000 mL | PREFILLED_SYRINGE | Freq: Once | INTRAMUSCULAR | Status: AC
  Administered 2017-03-15: 0.5 mL via INTRAMUSCULAR

## 2017-03-15 MED ORDER — ACETAMINOPHEN 325 MG PO TABS
650.0000 mg | ORAL_TABLET | Freq: Once | ORAL | Status: AC
  Administered 2017-03-15: 650 mg via ORAL
  Filled 2017-03-15: qty 2

## 2017-03-15 MED ORDER — INFLUENZA VAC SPLIT QUAD 0.5 ML IM SUSY
PREFILLED_SYRINGE | INTRAMUSCULAR | Status: AC
  Filled 2017-03-15: qty 0.5

## 2017-03-15 MED ORDER — DIPHENHYDRAMINE HCL 25 MG PO CAPS
25.0000 mg | ORAL_CAPSULE | Freq: Once | ORAL | Status: AC
  Administered 2017-03-15: 25 mg via ORAL

## 2017-03-15 MED ORDER — SODIUM CHLORIDE 0.9 % IV SOLN
250.0000 mL | Freq: Once | INTRAVENOUS | Status: AC
  Administered 2017-03-15: 250 mL via INTRAVENOUS

## 2017-03-15 MED ORDER — SODIUM CHLORIDE 0.9% FLUSH
10.0000 mL | INTRAVENOUS | Status: AC | PRN
  Administered 2017-03-15: 3 mL

## 2017-03-15 MED ORDER — HEPARIN SOD (PORK) LOCK FLUSH 100 UNIT/ML IV SOLN
250.0000 [IU] | INTRAVENOUS | Status: DC | PRN
Start: 2017-03-15 — End: 2017-03-15

## 2017-03-15 NOTE — Patient Instructions (Signed)
[place] at [facility] Discharge Instructions  RECOMMENDATIONS MADE BY THE CONSULTANT AND ANY TEST RESULTS WILL BE SENT TO YOUR REFERRING PHYSICIAN.  Received 2 units of blood today. Follow-up as scheduled. Call clinic for any questions or cocnerns  Thank you for choosing [place] at [facility] to provide your oncology and hematology care.  To afford each patient quality time with our provider, please arrive at least 15 minutes before your scheduled appointment time.    If you have a lab appointment with the [place] please come in thru the  Main Entrance and check in at the main information desk  You need to re-schedule your appointment should you arrive 10 or more minutes late.  We strive to give you quality time with our providers, and arriving late affects you and other patients whose appointments are after yours.  Also, if you no show three or more times for appointments you may be dismissed from the clinic at the providers discretion.     Again, thank you for choosing [facility].  Our hope is that these requests will decrease the amount of time that you wait before being seen by our physicians.       _____________________________________________________________  Should you have questions after your visit to [facility], please contact our office at [phone] between the hours of 8:30 a.m. and 4:30 p.m.  Voicemails left after 4:30 p.m. will not be returned until the following business day.  For prescription refill requests, have your pharmacy contact our office.       Resources For Cancer Patients and their Caregivers ? American Cancer Society: Can assist with transportation, wigs, general needs, runs Look Good Feel Better.        309-120-7465 ? Cancer Care: Provides financial assistance, online support groups, medication/co-pay assistance.  1-800-813-HOPE (805)313-6834) ? [place] Assists [place] cancer patients and their families through emotional , educational and financial support.  214-758-0254 ? [place] DSS Where to apply for food stamps, Medicaid and utility assistance. 8705402434 ? RCATS: Transportation to medical appointments. 539-715-7703 ? Social Security Administration: May apply for disability if have a Stage IV cancer. (754)538-1421 [phone] ? [employer], Disability and Transit Services: Assists with nutrition, care and transit needs. [place] Support Programs: @10RELATIVEDAYS @ > Cancer Support Group  2nd Tuesday of the month 1pm-2pm, Journey Room  > Creative Journey  3rd Tuesday of the month 1130am-1pm, Journey Room  > Look Good Feel Better  1st Wednesday of the month 10am-12 noon, Journey Room (Call [place] to register [phone])

## 2017-03-15 NOTE — Telephone Encounter (Signed)
Noted.  I know she was in the hospital for GI be=leed, is following up with GI. She is advised to see me PRN

## 2017-03-15 NOTE — Progress Notes (Signed)
[name] tolerated blood transfusion and Flu vaccine well without complaints or incident. VSS upon discharge. Pt discharged via wheelchair in satisfactory condition accompanied by her husband

## 2017-03-15 NOTE — Telephone Encounter (Signed)
[name] (did not specify where from) left message regarding patient. She states she got information from [facility] that patient was d/c'd from [facility] 10/16 and states she thought we handled all of our own transitional care calls/visits.   Questions call# [phone]

## 2017-03-16 LAB — TYPE AND SCREEN
ABO/RH(D): B POS
Antibody Screen: NEGATIVE
Unit division: 0
Unit division: 0

## 2017-03-16 LAB — BPAM RBC
BLOOD PRODUCT EXPIRATION DATE: 201811152359
BLOOD PRODUCT EXPIRATION DATE: 201811162359
ISSUE DATE / TIME: 201810190957
ISSUE DATE / TIME: 201810191233
UNIT TYPE AND RH: 5100
Unit Type and Rh: 5100

## 2017-03-19 ENCOUNTER — Other Ambulatory Visit (HOSPITAL_COMMUNITY): Payer: Medicare HMO

## 2017-03-19 ENCOUNTER — Encounter (HOSPITAL_COMMUNITY): Payer: Medicare HMO

## 2017-03-19 ENCOUNTER — Encounter (HOSPITAL_BASED_OUTPATIENT_CLINIC_OR_DEPARTMENT_OTHER): Payer: Medicare HMO | Admitting: Oncology

## 2017-03-19 ENCOUNTER — Encounter (HOSPITAL_COMMUNITY): Payer: Self-pay | Admitting: Oncology

## 2017-03-19 VITALS — BP 143/52 | HR 76 | Resp 18 | Ht 64.0 in | Wt 181.0 lb

## 2017-03-19 DIAGNOSIS — I129 Hypertensive chronic kidney disease with stage 1 through stage 4 chronic kidney disease, or unspecified chronic kidney disease: Secondary | ICD-10-CM | POA: Diagnosis not present

## 2017-03-19 DIAGNOSIS — D631 Anemia in chronic kidney disease: Secondary | ICD-10-CM

## 2017-03-19 DIAGNOSIS — D509 Iron deficiency anemia, unspecified: Secondary | ICD-10-CM

## 2017-03-19 DIAGNOSIS — D649 Anemia, unspecified: Secondary | ICD-10-CM | POA: Diagnosis not present

## 2017-03-19 DIAGNOSIS — N184 Chronic kidney disease, stage 4 (severe): Secondary | ICD-10-CM | POA: Diagnosis not present

## 2017-03-19 DIAGNOSIS — D62 Acute posthemorrhagic anemia: Secondary | ICD-10-CM

## 2017-03-19 DIAGNOSIS — E1122 Type 2 diabetes mellitus with diabetic chronic kidney disease: Secondary | ICD-10-CM | POA: Diagnosis not present

## 2017-03-19 DIAGNOSIS — N189 Chronic kidney disease, unspecified: Secondary | ICD-10-CM

## 2017-03-19 LAB — CBC WITH DIFFERENTIAL/PLATELET
Basophils Absolute: 0 10*3/uL (ref 0.0–0.1)
Basophils Relative: 0 %
EOS ABS: 0.3 10*3/uL (ref 0.0–0.7)
EOS PCT: 5 %
HCT: 25.1 % — ABNORMAL LOW (ref 36.0–46.0)
HEMOGLOBIN: 8 g/dL — AB (ref 12.0–15.0)
LYMPHS PCT: 17 %
Lymphs Abs: 1.1 10*3/uL (ref 0.7–4.0)
MCH: 29.6 pg (ref 26.0–34.0)
MCHC: 31.9 g/dL (ref 30.0–36.0)
MCV: 93 fL (ref 78.0–100.0)
MONOS PCT: 4 %
Monocytes Absolute: 0.3 10*3/uL (ref 0.1–1.0)
Neutro Abs: 4.7 10*3/uL (ref 1.7–7.7)
Neutrophils Relative %: 74 %
PLATELETS: 352 10*3/uL (ref 150–400)
RBC: 2.7 MIL/uL — ABNORMAL LOW (ref 3.87–5.11)
RDW: 18 % — ABNORMAL HIGH (ref 11.5–15.5)
WBC: 6.4 10*3/uL (ref 4.0–10.5)

## 2017-03-19 LAB — COMPREHENSIVE METABOLIC PANEL
ALK PHOS: 82 U/L (ref 38–126)
ALT: 14 U/L (ref 14–54)
ANION GAP: 8 (ref 5–15)
AST: 17 U/L (ref 15–41)
Albumin: 3 g/dL — ABNORMAL LOW (ref 3.5–5.0)
BUN: 45 mg/dL — ABNORMAL HIGH (ref 6–20)
CALCIUM: 8.5 mg/dL — AB (ref 8.9–10.3)
CO2: 20 mmol/L — ABNORMAL LOW (ref 22–32)
CREATININE: 1.82 mg/dL — AB (ref 0.44–1.00)
Chloride: 111 mmol/L (ref 101–111)
GFR, EST AFRICAN AMERICAN: 30 mL/min — AB (ref >60)
GFR, EST NON AFRICAN AMERICAN: 26 mL/min — AB (ref >60)
Glucose, Bld: 225 mg/dL — ABNORMAL HIGH (ref 65–99)
Potassium: 4.2 mmol/L (ref 3.5–5.1)
SODIUM: 139 mmol/L (ref 135–145)
TOTAL PROTEIN: 6 g/dL — AB (ref 6.5–8.1)
Total Bilirubin: 0.3 mg/dL (ref 0.3–1.2)

## 2017-03-19 LAB — IRON AND TIBC
IRON: 41 ug/dL (ref 28–170)
SATURATION RATIOS: 14 % (ref 10.4–31.8)
TIBC: 295 ug/dL (ref 250–450)
UIBC: 254 ug/dL

## 2017-03-19 LAB — FERRITIN: Ferritin: 155 ng/mL (ref 11–307)

## 2017-03-19 NOTE — Progress Notes (Signed)
[place] [facility] Follow up:    Briana Everts, MD [phone] [address] [place] [address]   DIAGNOSIS: Normocytic Anemia  HISTORY OF PRESENT ILLNESS:   [name] is a 76 y.o. female with a medical history significant for chronic renal disease, Stage IV, DM, history of NSTEMI, HTN, hyperlipidemia,  who is referred to the [facility] for normocytic, normochromic anemia with preservation of WBC and platelet count in the setting of chronic renal disease and POSITIVE stool cards.  Patient was admitted to [facility] on 10/25/2016 discharged on 10/29/2016 when she was found at home unresponsive per patient's husband.  Patient reported that she was feeling fatigued over the last several days and complained of black stools.  The patient does take iron tablets.  In the emergency department, the patient was found to have severe anemia with a hemoglobin of 3.8 g/dL. she received 4 units of packed red blood cells.  She also has a history of requiring 4 units of packed red blood cells on 10/04/2016 as well.  During her hospitalization, she was found to have fecal occult positive stool cards on 10/29/2016 2.  She is S/P colonoscopy by Dr. Ronnette Juniper on 10/08/2016 which revealed 1 5 mm polyp in the transverse colon and resection was not attempted due to anticoagulation.  Diverticulosis in sigmoid colon and nonbleeding internal hemorrhoids were noted as well.  Examination was otherwise negative.  She also went a camera capsule study on 10/08/2016 by the same position showing no mucosal abnormality within the small bowel.  She reports a history of melanoma.  She notes that this has improved and resolved.  She admits to a good energy level and denies any weakness.  She notes her stamina is good as well.  She walks at [facility] frequently for exercise.  She interestingly admits to pica having consumed a crushed ice for years.  She reports [name] as the best  ice.  Her anemia workup demonstrated a mild iron deficiency with a ferritin of 39. The rest of her anemia workup including a vitamin B12 level, folate level, SPEP/IFE, LDH were within normal limits. Patient received 2 doses of Injectafer on 11/15/16 and 12/03/16.  INTERVAL HISTORY: [name] 76 y.o. female returns for follow-up for her anemia with her friend. She states that she feels an improvement in her energy level. She denies any chest pain, shortness breath, abdominal pain, focal weakness, bleeding.  Patient was hospitalized from 10/12-10/16/18 recently. Patient initially presented to the hospital for generalized weakness and slurred speech. On 03/08/2017 she's found to have a hemoglobin 4.8 g/dL. Baseline hemoglobin from 11/23/2016 was 10.3 g/dL, hematocrit 31.8%, MCV 89.1. Patient has had a total 4 units PRBC transfusion while she has been inpatient and her hemoglobin is 8.5 g/dL today. Patient had elevated troponin levels on admission which was thought to be due to demand ischemia, she was asymptomatic and did not have any chest pain or shortness of breath. Guaiac stool tests have been negative for blood. She had a EGD performed on 03/12/17 which demonstrated mild gastritis, no source of bleeding found. She did not have a repeat Givens capsule study this time, but patient states that she had 2 negative ones in the past. Hemoglobin was 8.5 g/dL on the day of discharge. I had repeated her CBC on 03/14/17 and her hemoglobin had dropped down to 6.7 g/dL. I gave her another 2 units pRBC transfusion on 03/15/17.  Patient  states that in the past few weeks her stools have looked black, like "shiny coal" but has become a dark brown in the last few days. She feels fatigued. She denies any chest pain, palpitations, SOB, abdominal pain, focal weakness, dizziness.  Patient Active Problem List   Diagnosis Date Noted  . Acute on chronic anemia   . Slurred speech 03/08/2017  . Essential hypertension,  benign 12/13/2016  . Hypothyroidism 12/13/2016  . Acute blood loss anemia 11/26/2016  . Iron deficiency anemia 11/08/2016  . Normocytic anemia 10/26/2016  . Elevated troponin   . Fatigue   . Melena   . Aortic atherosclerosis ([place]) 10/25/2016  . Symptomatic anemia 10/04/2016  . Acute kidney injury superimposed on CKD ([place]) 10/04/2016  . History of non-ST elevation myocardial infarction (NSTEMI) 10/04/2016  . Hyperlipidemia 10/04/2016  . Neuropathy 09/20/2016  . Chronic kidney disease, stage IV (severe) ([place]) 08/03/2015  . Hip fracture ([place]) 02/05/2015  . Diabetes mellitus with stage 4 chronic kidney disease ([place]) 02/05/2015  . HTN (hypertension) 02/05/2015  . Gout 02/05/2015  . Left humeral fracture 02/05/2015  . Hyperlipidemia associated with type 2 diabetes mellitus ([place]) 05/01/2013    is allergic to insulin glargine and statins.  MEDICAL HISTORY: Past Medical History:  Diagnosis Date  . Anemia   . Arthritis   . Blood transfusion without reported diagnosis   . Cataract   . Chronic anemia   . Chronic combined systolic and diastolic CHF (congestive heart failure) ([place])    a. 2D echo 08/2016 at [facility]: EF 50-55% with inferior wall HK, impaired LV filling, fair study.  . CKD (chronic kidney disease), stage III ([place])   . Diabetes ([place])   . Gastritis   . GERD (gastroesophageal reflux disease)   . Gout   . HTN (hypertension)   . Hyperlipidemia   . Iron deficiency anemia 11/08/2016  . Normocytic anemia 10/26/2016  . NSTEMI (non-ST elevated myocardial infarction) ([place])    a. Complex admission 08/2016 - with severe hyperglycemia, AKI on CKD, severe anemia down to Hgb 6.8, acute combined CHF, troponin of 8.5, cath deferred due to renal dysfunction.  . Thyroid disease     SURGICAL HISTORY: Past Surgical History:  Procedure Laterality Date  . COLONOSCOPY WITH PROPOFOL N/A 10/08/2016   Procedure: COLONOSCOPY WITH PROPOFOL;  Surgeon: Ronnette Juniper, MD;  Location: [facility];  Service:  Gastroenterology;  Laterality: N/A;  . ESOPHAGOGASTRODUODENOSCOPY N/A 10/06/2016   Procedure: ESOPHAGOGASTRODUODENOSCOPY (EGD);  Surgeon: Otis Brace, MD;  Location: [facility] ENDOSCOPY;  Service: Gastroenterology;  Laterality: N/A;  . ESOPHAGOGASTRODUODENOSCOPY (EGD) WITH PROPOFOL N/A 03/12/2017   Procedure: ESOPHAGOGASTRODUODENOSCOPY (EGD) WITH PROPOFOL;  Surgeon: Danie Binder, MD;  [name];  Service: Endoscopy;  Laterality: N/A;  . GIVENS CAPSULE STUDY  10/08/2016   Procedure: GIVENS CAPSULE STUDY;  Surgeon: Ronnette Juniper, MD;  Location: [facility];  Service: Gastroenterology;;  . Freda Munro CAPSULE STUDY N/A 10/29/2016   Procedure: GIVENS CAPSULE STUDY;  Surgeon: Danie Binder, MD;  [name];  Service: Endoscopy;  Laterality: N/A;  . ORIF HUMERUS FRACTURE Left 02/07/2015   Procedure: OPEN REDUCTION INTERNAL FIXATION (ORIF) PROXIMAL HUMERUS FRACTURE;  Surgeon: Marybelle Killings, MD;  Location: [facility];  Service: Orthopedics;  Laterality: Left;  . THYROID SURGERY    . TOTAL HIP ARTHROPLASTY Left 02/07/2015   Procedure: TOTAL HIP ARTHROPLASTY ANTERIOR APPROACH ;  Surgeon: Marybelle Killings, MD;  Location: [facility];  Service: Orthopedics;  Laterality: Left;  . WRIST SURGERY Left  SOCIAL HISTORY: Social History   Social History  . Marital status: Widowed    Spouse name: [name]  . Number of children: 2  . Years of education: 12   Occupational History  . retired    Social History Main Topics  . Smoking status: Never Smoker  . Smokeless tobacco: Never Used  . Alcohol use No  . Drug use: No  . Sexual activity: Not Currently   Other Topics Concern  . Not on file   Social History Narrative   Lives with significant other/partner [name]   He is her caregiver   Moved from [facility]    FAMILY HISTORY: Family History  Problem Relation Age of Onset  . Diabetes Mother   . Asthma Mother   . Early death Mother 47       Pneumonia  . Early death Father        Killed at rodeo  .  CAD Neg Hx   . GI Bleed Neg Hx     Review of Systems  Constitutional: Positive for fatigue. Negative for appetite change, chills and fever.  HENT:   Negative for hearing loss, lump/mass, mouth sores, sore throat and tinnitus.   Eyes: Negative for eye problems and icterus.  Respiratory: Negative for chest tightness, cough, hemoptysis, shortness of breath and wheezing.   Cardiovascular: Negative for chest pain, leg swelling and palpitations.  Gastrointestinal: Negative for abdominal distention, abdominal pain, blood in stool, diarrhea, nausea and vomiting.       Melena  Endocrine: Negative.  Negative for hot flashes.  Genitourinary: Negative for difficulty urinating, frequency and hematuria.   Musculoskeletal: Negative for arthralgias and neck pain.  Skin: Negative for itching and rash.  Neurological: Negative for dizziness, headaches and speech difficulty.  Hematological: Negative for adenopathy. Does not bruise/bleed easily.  Psychiatric/Behavioral: Negative for confusion. The patient is not nervous/anxious.       PHYSICAL EXAMINATION   Vitals:   03/19/17 1455  BP: (!) 143/52  Pulse: 76  Resp: 18  SpO2: 100%    Physical Exam  Constitutional: She is oriented to person, place, and time and well-developed, well-nourished, and in no distress. No distress.  HENT:  Head: Normocephalic and atraumatic.  Mouth/Throat: No oropharyngeal exudate.  Eyes: Pupils are equal, round, and reactive to light. Conjunctivae are normal. No scleral icterus.  Neck: Normal range of motion. Neck supple. No JVD present.  Cardiovascular: Normal rate, regular rhythm and normal heart sounds.  Exam reveals no gallop and no friction rub.   No murmur heard. Pulmonary/Chest: Breath sounds normal. No respiratory distress. She has no wheezes. She has no rales.  Abdominal: Soft. Bowel sounds are normal. She exhibits no distension. There is no tenderness. There is no guarding.  Musculoskeletal: She exhibits no  edema or tenderness.  Lymphadenopathy:    She has no cervical adenopathy.  Neurological: She is alert and oriented to person, place, and time. No cranial nerve deficit.  Skin: Skin is warm and dry. No rash noted. No erythema. No pallor.  Psychiatric: Affect and judgment normal.    LABORATORY DATA:  CBC    Component Value Date/Time   WBC 6.4 03/19/2017 1537   RBC 2.70 (L) 03/19/2017 1537   HGB 8.0 (L) 03/19/2017 1537   HCT 25.1 (L) 03/19/2017 1537   PLT 352 03/19/2017 1537   MCV 93.0 03/19/2017 1537   MCH 29.6 03/19/2017 1537   MCHC 31.9 03/19/2017 1537   RDW 18.0 (H) 03/19/2017 1537   LYMPHSABS 1.1 03/19/2017  1537   MONOABS 0.3 03/19/2017 1537   EOSABS 0.3 03/19/2017 1537   BASOSABS 0.0 03/19/2017 1537    CMP     Component Value Date/Time   NA 139 03/19/2017 1537   K 4.2 03/19/2017 1537   CL 111 03/19/2017 1537   CO2 20 (L) 03/19/2017 1537   GLUCOSE 225 (H) 03/19/2017 1537   BUN 45 (H) 03/19/2017 1537   CREATININE 1.82 (H) 03/19/2017 1537   CREATININE 2.58 (H) 10/25/2016 1026   CALCIUM 8.5 (L) 03/19/2017 1537   PROT 6.0 (L) 03/19/2017 1537   ALBUMIN 3.0 (L) 03/19/2017 1537   AST 17 03/19/2017 1537   ALT 14 03/19/2017 1537   ALKPHOS 82 03/19/2017 1537   BILITOT 0.3 03/19/2017 1537   GFRNONAA 26 (L) 03/19/2017 1537   GFRNONAA 17 (L) 10/25/2016 1026   GFRAA 30 (L) 03/19/2017 1537   GFRAA 20 (L) 10/25/2016 1026    ASSESSMENT:  1. Multifactorial anemia from chronic kidney disease and iron deficiency. Suspect she likely has iron deficiency from GI bleed with melanotic stools, although the source of bleeding has not been found.  2. CKD Stage IV  PLAN: -I will set patient up for 2 doses of feraheme this week for her iron deficiency. -I have also discussed starting ESA therapy with aranesp q2 weeks at this time and she is agreeable to starting. Her first dose will be this week. Adjustments to her dose and frequency of aranesp therapy will be dependent on her response.  Goal is to get her hemoglobin to 11 g/dL. -I have discussed bone marrow biopsy and aspirate to rule out other causes for her anemia, such as MDS with the patient. She is hesitant at this time. I have told her that we will monitor her closely and if we correct her iron deficiency and she has been on aranesp for a few months without any major improvement, then I would like her to get a bone marrow biopsy to rule out other causes. Patient agreed. -RTC in 2 months for follow up with labs below.    Orders Placed This Encounter  Procedures  . CBC with Differential    Standing Status:   Future    Number of Occurrences:   1    Standing Expiration Date:   03/19/2018  . CBC with Differential    Standing Status:   Future    Number of Occurrences:   1    Standing Expiration Date:   03/19/2018  . Comprehensive metabolic panel    Standing Status:   Future    Number of Occurrences:   1    Standing Expiration Date:   03/19/2018  . Iron and TIBC    Standing Status:   Future    Number of Occurrences:   1    Standing Expiration Date:   03/19/2018  . Ferritin    Standing Status:   Future    Number of Occurrences:   1    Standing Expiration Date:   03/19/2018    All questions were answered. The patient knows to call the clinic with any problems, questions or concerns. We can certainly see the patient much sooner if necessary.  This note was electronically signed. Twana First, MD 03/19/2017

## 2017-03-21 ENCOUNTER — Encounter: Payer: Self-pay | Admitting: Gastroenterology

## 2017-03-21 NOTE — Progress Notes (Signed)
Patient scheduled.

## 2017-03-22 ENCOUNTER — Encounter (HOSPITAL_COMMUNITY): Payer: Self-pay

## 2017-03-22 ENCOUNTER — Encounter (HOSPITAL_BASED_OUTPATIENT_CLINIC_OR_DEPARTMENT_OTHER): Payer: Medicare HMO

## 2017-03-22 VITALS — BP 111/35 | HR 66 | Temp 98.6°F | Resp 16

## 2017-03-22 DIAGNOSIS — D631 Anemia in chronic kidney disease: Secondary | ICD-10-CM | POA: Diagnosis not present

## 2017-03-22 DIAGNOSIS — N184 Chronic kidney disease, stage 4 (severe): Secondary | ICD-10-CM

## 2017-03-22 DIAGNOSIS — N189 Chronic kidney disease, unspecified: Secondary | ICD-10-CM | POA: Diagnosis not present

## 2017-03-22 DIAGNOSIS — D500 Iron deficiency anemia secondary to blood loss (chronic): Secondary | ICD-10-CM

## 2017-03-22 MED ORDER — SODIUM CHLORIDE 0.9 % IV SOLN
510.0000 mg | Freq: Once | INTRAVENOUS | Status: AC
  Administered 2017-03-22: 510 mg via INTRAVENOUS
  Filled 2017-03-22: qty 17

## 2017-03-22 MED ORDER — SODIUM CHLORIDE 0.9 % IV SOLN
INTRAVENOUS | Status: DC
  Administered 2017-03-22: 14:00:00 via INTRAVENOUS

## 2017-03-22 MED ORDER — SODIUM CHLORIDE 0.9 % IV SOLN
510.0000 mg | Freq: Once | INTRAVENOUS | Status: DC
  Filled 2017-03-22: qty 17

## 2017-03-22 NOTE — Patient Instructions (Signed)
[place] at [facility] Discharge Instructions  RECOMMENDATIONS MADE BY THE CONSULTANT AND ANY TEST RESULTS WILL BE SENT TO YOUR REFERRING PHYSICIAN.  Feraheme today,aranesp next week. Follow up as scheduled  Thank you for choosing [place] at [facility] to provide your oncology and hematology care.  To afford each patient quality time with our provider, please arrive at least 15 minutes before your scheduled appointment time.    If you have a lab appointment with the [place] please come in thru the  Main Entrance and check in at the main information desk  You need to re-schedule your appointment should you arrive 10 or more minutes late.  We strive to give you quality time with our providers, and arriving late affects you and other patients whose appointments are after yours.  Also, if you no show three or more times for appointments you may be dismissed from the clinic at the providers discretion.     Again, thank you for choosing [facility].  Our hope is that these requests will decrease the amount of time that you wait before being seen by our physicians.       _____________________________________________________________  Should you have questions after your visit to [facility], please contact our office at [phone] between the hours of 8:30 a.m. and 4:30 p.m.  Voicemails left after 4:30 p.m. will not be returned until the following business day.  For prescription refill requests, have your pharmacy contact our office.       Resources For Cancer Patients and their Caregivers ? American Cancer Society: Can assist with transportation, wigs, general needs, runs Look Good Feel Better.        2265355073 ? Cancer Care: Provides financial assistance, online support groups, medication/co-pay assistance.  1-800-813-HOPE 956-473-7555) ? [facility] Assists [place] cancer  patients and their families through emotional , educational and financial support.  (603) 159-1361 ? [place] DSS Where to apply for food stamps, Medicaid and utility assistance. (956)456-8413 ? [employer]ATS: Transportation to medical appointments. (201)255-3258 ? Social Security Administration: May apply for disability if have a Stage IV cancer. 334-853-1102 [phone] ? [employer], Disability and Transit Services: Assists with nutrition, care and transit needs. [place] Support Programs: @10RELATIVEDAYS @ > Cancer Support Group  2nd Tuesday of the month 1pm-2pm, Journey Room  > Creative Journey  3rd Tuesday of the month 1130am-1pm, Journey Room  > Look Good Feel Better  1st Wednesday of the month 10am-12 noon, Journey Room (Call [place] to register [phone])

## 2017-03-22 NOTE — Progress Notes (Signed)
Treatment given per orders. Patient tolerated it well without problems. Vitals stable and discharged home from clinic ambulatory. Follow up as scheduled.  

## 2017-03-25 ENCOUNTER — Other Ambulatory Visit: Payer: Self-pay

## 2017-03-25 ENCOUNTER — Telehealth (HOSPITAL_COMMUNITY): Payer: Self-pay | Admitting: *Deleted

## 2017-03-25 DIAGNOSIS — K900 Celiac disease: Secondary | ICD-10-CM

## 2017-03-25 NOTE — Telephone Encounter (Signed)
spoke with patient and she verbalizes understanding of new appointment schedule. she voices intent on being here on Wednesday morning.

## 2017-03-26 ENCOUNTER — Ambulatory Visit (INDEPENDENT_AMBULATORY_CARE_PROVIDER_SITE_OTHER): Payer: Medicare HMO | Admitting: "Endocrinology

## 2017-03-26 ENCOUNTER — Encounter: Payer: Self-pay | Admitting: "Endocrinology

## 2017-03-26 VITALS — BP 149/68 | HR 75 | Ht 64.0 in | Wt 180.0 lb

## 2017-03-26 DIAGNOSIS — E1122 Type 2 diabetes mellitus with diabetic chronic kidney disease: Secondary | ICD-10-CM

## 2017-03-26 DIAGNOSIS — E039 Hypothyroidism, unspecified: Secondary | ICD-10-CM | POA: Diagnosis not present

## 2017-03-26 DIAGNOSIS — N184 Chronic kidney disease, stage 4 (severe): Secondary | ICD-10-CM | POA: Diagnosis not present

## 2017-03-26 DIAGNOSIS — I10 Essential (primary) hypertension: Secondary | ICD-10-CM

## 2017-03-26 NOTE — Patient Instructions (Signed)

## 2017-03-26 NOTE — Progress Notes (Signed)
Subjective:    Patient ID: [name], female    DOB: [birth year]. Patient is being seen in f/u for management of diabetes requested by  [name], MD  Past Medical History:  Diagnosis Date  . Anemia   . Arthritis   . Blood transfusion without reported diagnosis   . Cataract   . Chronic anemia   . Chronic combined systolic and diastolic CHF (congestive heart failure) ([place])    a. 2D echo 08/2016 at [facility]: EF 50-55% with inferior wall HK, impaired LV filling, fair study.  . CKD (chronic kidney disease), stage III ([place])   . Diabetes ([place])   . Gastritis   . GERD (gastroesophageal reflux disease)   . Gout   . HTN (hypertension)   . Hyperlipidemia   . Iron deficiency anemia 11/08/2016  . Normocytic anemia 10/26/2016  . NSTEMI (non-ST elevated myocardial infarction) ([place])    a. Complex admission 08/2016 - with severe hyperglycemia, AKI on CKD, severe anemia down to Hgb 6.8, acute combined CHF, troponin of 8.5, cath deferred due to renal dysfunction.  . Thyroid disease    Past Surgical History:  Procedure Laterality Date  . COLONOSCOPY WITH PROPOFOL N/A 10/08/2016   Procedure: COLONOSCOPY WITH PROPOFOL;  Surgeon: Ronnette Juniper, MD;  Location: [place];  Service: Gastroenterology;  Laterality: N/A;  . ESOPHAGOGASTRODUODENOSCOPY N/A 10/06/2016   Procedure: ESOPHAGOGASTRODUODENOSCOPY (EGD);  Surgeon: Otis Brace, MD;  Location: [facility] ENDOSCOPY;  Service: Gastroenterology;  Laterality: N/A;  . ESOPHAGOGASTRODUODENOSCOPY (EGD) WITH PROPOFOL N/A 03/12/2017   Procedure: ESOPHAGOGASTRODUODENOSCOPY (EGD) WITH PROPOFOL;  Surgeon: Danie Binder, MD;  [name];  Service: Endoscopy;  Laterality: N/A;  . GIVENS CAPSULE STUDY  10/08/2016   Procedure: GIVENS CAPSULE STUDY;  Surgeon: Ronnette Juniper, MD;  Location: [place];  Service: Gastroenterology;;  . Freda Munro CAPSULE STUDY N/A 10/29/2016   Procedure: GIVENS CAPSULE STUDY;  Surgeon: Danie Binder, MD;  [facility];  Service: Endoscopy;  Laterality: N/A;  . ORIF HUMERUS FRACTURE Left 02/07/2015   Procedure: OPEN REDUCTION INTERNAL FIXATION (ORIF) PROXIMAL HUMERUS FRACTURE;  Surgeon: Marybelle Killings, MD;  Location: [place];  Service: Orthopedics;  Laterality: Left;  . THYROID SURGERY    . TOTAL HIP ARTHROPLASTY Left 02/07/2015   Procedure: TOTAL HIP ARTHROPLASTY ANTERIOR APPROACH ;  Surgeon: Marybelle Killings, MD;  Location: [place];  Service: Orthopedics;  Laterality: Left;  . WRIST SURGERY Left    Social History   Social History  . Marital status: Widowed    Spouse name: [name]  . Number of children: 2  . Years of education: 12   Occupational History  . retired    Social History Main Topics  . Smoking status: Never Smoker  . Smokeless tobacco: Never Used  . Alcohol use No  . Drug use: No  . Sexual activity: Not Currently   Other Topics Concern  . None   Social History Narrative   Lives with significant other/partner [name]   He is her caregiver   Moved from [facility]   Outpatient Encounter Prescriptions as of 03/26/2017  Medication Sig  . amLODipine (NORVASC) 10 MG tablet Take 1 tablet (10 mg total) by mouth daily.  . furosemide (LASIX) 20 MG tablet Take 20 mg by mouth as needed. Only has to take it when she gains 2 lbs  . gabapentin (NEURONTIN) 300 MG capsule Take 2 capsules by mouth at bedtime.   . hydrALAZINE (APRESOLINE) 25 MG tablet Take 1 tablet (  25 mg total) by mouth every 8 (eight) hours as needed.  . Insulin Glargine (TOUJEO MAX SOLOSTAR) 300 UNIT/ML SOPN Inject 20 Units into the skin at bedtime.  . Insulin Pen Needle (B-D ULTRAFINE III SHORT PEN) 31G X 8 MM MISC 1 each by Does not apply route as directed.  . Insulin Pen Needle (PEN NEEDLES) 32G X 6 MM MISC 1 each by Does not apply route at bedtime.  Marland Kitchen levothyroxine (SYNTHROID, LEVOTHROID) 50 MCG tablet Take 1 tablet (50 mcg total) by mouth daily before breakfast.  . metoprolol tartrate (LOPRESSOR) 25 MG tablet Take 12.5 mg by mouth 2 (two)  times daily.  . Multiple Vitamin (MULTIVITAMIN WITH MINERALS) TABS tablet Take 1 tablet by mouth daily.  . ondansetron (ZOFRAN) 4 MG tablet Take 1 tablet (4 mg total) by mouth every 8 (eight) hours as needed for nausea or vomiting.  . pantoprazole (PROTONIX) 40 MG tablet Take 1 tablet (40 mg total) by mouth daily. 30 minutes before breakfast  . ULORIC 40 MG tablet Take 1 tablet by mouth daily.   No facility-administered encounter medications on file as of 03/26/2017.    ALLERGIES: Allergies  Allergen Reactions  . Insulin Glargine Swelling    "Makes me swell like a balloon all over", including face, but without any respiratory distress or rashes. Associated with weight gain.  . Statins Other (See Comments)    "I've tried them all; my muscle aches were so bad I couldn't walk".   VACCINATION STATUS: Immunization History  Administered Date(s) Administered  . Influenza,inj,Quad PF,6+ Mos 03/15/2017    Diabetes  She presents for her follow-up diabetic visit. She has type 2 diabetes mellitus. Onset time: She was diagnosed at approximate age of 76 years. Her disease course has been stable (She comes in prior to her scheduled appointment due to recent hospitalization with GI bleed when she was observed to have above target blood glucose readings. Her blood glucose readings have largely stabilized since her discharge from hospital.). Hypoglycemia symptoms include confusion, dizziness, mood changes and sweats. Pertinent negatives for hypoglycemia include no headaches, pallor or seizures. There are no diabetic associated symptoms. Pertinent negatives for diabetes include no chest pain, no polydipsia, no polyphagia and no polyuria. There are no hypoglycemic complications. Symptoms are stable. Diabetic complications include nephropathy. Risk factors for coronary artery disease include diabetes mellitus, hypertension and post-menopausal. Current diabetic treatment includes oral agent (monotherapy) (She is  taking glipizide 10 mg by mouth twice a day, her recent A1c is 5.3%. She reports frequent hypoglycemia into the 50s.). Her weight is increasing steadily. She is following a generally unhealthy diet. When asked about meal planning, she reported none. She has not had a previous visit with a dietitian. She never participates in exercise. Her home blood glucose trend is increasing steadily. Her breakfast blood glucose range is generally 140-180 mg/dl. An ACE inhibitor/angiotensin II receptor blocker is being taken. Eye exam is current.  Hypertension  This is a chronic problem. The current episode started more than 1 year ago. Associated symptoms include sweats. Pertinent negatives include no chest pain, headaches, palpitations or shortness of breath. Past treatments include calcium channel blockers and direct vasodilators. Hypertensive end-organ damage includes kidney disease.     Review of Systems  Constitutional: Negative for chills, fever and unexpected weight change.  HENT: Negative for trouble swallowing and voice change.   Eyes: Negative for visual disturbance.  Respiratory: Negative for cough, shortness of breath and wheezing.   Cardiovascular: Negative for chest pain,  palpitations and leg swelling.  Gastrointestinal: Negative for diarrhea, nausea and vomiting.  Endocrine: Negative for cold intolerance, heat intolerance, polydipsia, polyphagia and polyuria.  Musculoskeletal: Negative for arthralgias and myalgias.  Skin: Negative for color change, pallor, rash and wound.  Neurological: Positive for dizziness. Negative for seizures and headaches.  Psychiatric/Behavioral: Positive for confusion. Negative for suicidal ideas.    Objective:    BP (!) 149/68   Pulse 75   Ht 5\' 4"  (1.626 m)   Wt 180 lb (81.6 kg)   BMI 30.90 kg/m   Wt Readings from Last 3 Encounters:  03/26/17 180 lb (81.6 kg)  03/19/17 181 lb (82.1 kg)  03/10/17 181 lb 10.5 oz (82.4 kg)    Physical Exam  Constitutional:  She is oriented to person, place, and time. She appears well-developed.  HENT:  Head: Normocephalic and atraumatic.  Eyes: EOM are normal.  Neck: Normal range of motion. Neck supple. No tracheal deviation present. No thyromegaly present.  Cardiovascular: Normal rate and regular rhythm.   Pulmonary/Chest: Effort normal and breath sounds normal.  Abdominal: Soft. Bowel sounds are normal. There is no tenderness. There is no guarding.  Musculoskeletal: Normal range of motion. She exhibits no edema.  Neurological: She is alert and oriented to person, place, and time. She has normal reflexes. No cranial nerve deficit. Coordination normal.  Skin: Skin is warm and dry. No rash noted. No erythema. No pallor.  Psychiatric: She has a normal mood and affect. Judgment normal.     CMP     Component Value Date/Time   NA 139 03/19/2017 1537   K 4.2 03/19/2017 1537   CL 111 03/19/2017 1537   CO2 20 (L) 03/19/2017 1537   GLUCOSE 225 (H) 03/19/2017 1537   BUN 45 (H) 03/19/2017 1537   CREATININE 1.82 (H) 03/19/2017 1537   CREATININE 2.58 (H) 10/25/2016 1026   CALCIUM 8.5 (L) 03/19/2017 1537   PROT 6.0 (L) 03/19/2017 1537   ALBUMIN 3.0 (L) 03/19/2017 1537   AST 17 03/19/2017 1537   ALT 14 03/19/2017 1537   ALKPHOS 82 03/19/2017 1537   BILITOT 0.3 03/19/2017 1537   GFRNONAA 26 (L) 03/19/2017 1537   GFRNONAA 17 (L) 10/25/2016 1026   GFRAA 30 (L) 03/19/2017 1537   GFRAA 20 (L) 10/25/2016 1026     Diabetic Labs (most recent): Lab Results  Component Value Date   HGBA1C 6.8 (H) 02/04/2017   HGBA1C 5.3 10/25/2016   HGBA1C 6.0 (H) 10/05/2016    Assessment & Plan:   1. Diabetes mellitus with stage 4 chronic kidney disease ([place])  - Patient has currently uncontrolled symptomatic type 2 DM since 76 years of age. -  Her recent A1c of 6.8% from 02/04/2017. She was recently hospitalized due to GI bleed when her hemoglobin dropped to 4.8 and currently reversing after repeated blood transfusion. This  history may affect her next A1c.  Recent labs reviewed, showing stage 4 CKD.   - her diabetes is complicated by CKD and patient remains at a high risk for more acute and chronic complications which include CAD, CVA, ERSD, retinopathy, and neuropathy. These are all discussed in detail with the patient.  - I have counseled the patient on diet management and weight loss, by adopting a carbohydrate restricted/protein rich diet.  -  Suggestion is made for her to avoid simple carbohydrates  from her diet including Cakes, Sweet Desserts / Pastries, Ice Cream, Soda (diet and regular), Sweet Tea, Candies, Chips, Cookies, Store Bought Juices, Alcohol in  Excess of  1-2 drinks a day, Artificial Sweeteners, and "Sugar-free" Products. This will help patient to have stable blood glucose profile and potentially avoid unintended weight gain.  - I encouraged the patient to switch to  unprocessed or minimally processed complex starch and increased protein intake (animal or plant source), fruits, and vegetables.  - Patient is advised to stick to a routine mealtimes to eat 3 meals  a day and avoid unnecessary snacks ( to snack only to correct hypoglycemia).   - I have approached patient with the following individualized plan to manage diabetes and patient agrees:   - Main goal in her therapy still  would be to avoid hypoglycemia. - Her presentation today is better and several ways including absence of hypoglycemia and more consistent readings of blood glucose averaging between 140-180. -  She would not need additional treatment for diabetes. I advised her to continue  Toujeo 20 units daily at bedtime associated with monitoring of blood glucose 2 times daily-before breakfast and bedtime.    -Patient is encouraged to call clinic for blood glucose levels less than 70 or above 200 mg /dl.  -Patient is not a candidate for metformin, SGLT2 inhibitors  due to CKD.  - Patient specific target  A1c;  LDL, HDL,  Triglycerides, and  Waist Circumference were discussed in detail.  2) BP/HTN: Uncontrolled. I advised her to continue current medications , not on ACEI/ARB- will be considered next visit if not initiated by nephrology, she has a visit with nephrology next week. 3) Lipids/HPL:   She has a reported history of statin allergy, her recent triglycerides are elevated at 476, total cholesterol 238. She will be given a trial of fenofibrate.  4)  Weight/Diet: CDE Consult has been initiated , exercise, and detailed carbohydrates information provided.  5) hypothyroidism: -Her recent thyroid function tests are consistent with appropriate replacement. I  advised her to continue levothyroxine 125 g by mouth every morning.  - We discussed about correct intake of levothyroxine, at fasting, with water, separated by at least 30 minutes from breakfast, and separated by more than 4 hours from calcium, iron, multivitamins, acid reflux medications (PPIs). -Patient is made aware of the fact that thyroid hormone replacement is needed for life, dose to be adjusted by periodic monitoring of thyroid function tests. 6) Chronic Care/Health Maintenance:  -Patient is not  on ACEI/ARB and Statin medications and encouraged to continue to follow up with Ophthalmology, Podiatrist at least yearly or according to recommendations, and advised to  stay away from smoking. I have recommended yearly flu vaccine and pneumonia vaccination at least every 5 years; moderate intensity exercise for up to 150 minutes weekly; and  sleep for at least 7 hours a day.  - I advised patient to maintain close follow up with Meda Coffee Jennette Banker, MD for primary care needs.  - Time spent with the patient: 25 min, of which >50% was spent in reviewing her sugar logs , discussing her hypo- and hyper-glycemic episodes, reviewing her current and  previous labs and insulin doses and developing a plan to avoid hypo- and hyper-glycemia.    Follow up plan: - Return  keep prior appointment with labs, for meter, and logs.  Glade [name], MD Phone: [phone]  Fax: [phone]   -  This note was partially dictated with voice recognition software. Similar sounding words can be transcribed inadequately or may not  be corrected upon review.  03/26/2017, 11:27 AM

## 2017-03-27 ENCOUNTER — Encounter (HOSPITAL_COMMUNITY): Payer: Self-pay

## 2017-03-27 ENCOUNTER — Encounter (HOSPITAL_COMMUNITY): Payer: Medicare HMO

## 2017-03-27 ENCOUNTER — Encounter (HOSPITAL_BASED_OUTPATIENT_CLINIC_OR_DEPARTMENT_OTHER): Payer: Medicare HMO

## 2017-03-27 VITALS — BP 143/55 | HR 80 | Temp 98.1°F | Resp 18

## 2017-03-27 DIAGNOSIS — I129 Hypertensive chronic kidney disease with stage 1 through stage 4 chronic kidney disease, or unspecified chronic kidney disease: Secondary | ICD-10-CM | POA: Diagnosis not present

## 2017-03-27 DIAGNOSIS — D631 Anemia in chronic kidney disease: Secondary | ICD-10-CM | POA: Diagnosis not present

## 2017-03-27 DIAGNOSIS — N189 Chronic kidney disease, unspecified: Secondary | ICD-10-CM | POA: Diagnosis not present

## 2017-03-27 DIAGNOSIS — D500 Iron deficiency anemia secondary to blood loss (chronic): Secondary | ICD-10-CM

## 2017-03-27 DIAGNOSIS — D509 Iron deficiency anemia, unspecified: Secondary | ICD-10-CM | POA: Diagnosis not present

## 2017-03-27 DIAGNOSIS — D649 Anemia, unspecified: Secondary | ICD-10-CM | POA: Diagnosis not present

## 2017-03-27 DIAGNOSIS — N184 Chronic kidney disease, stage 4 (severe): Secondary | ICD-10-CM | POA: Diagnosis not present

## 2017-03-27 DIAGNOSIS — E1122 Type 2 diabetes mellitus with diabetic chronic kidney disease: Secondary | ICD-10-CM | POA: Diagnosis not present

## 2017-03-27 LAB — HEMOGLOBIN: HEMOGLOBIN: 9.1 g/dL — AB (ref 12.0–15.0)

## 2017-03-27 MED ORDER — DARBEPOETIN ALFA 200 MCG/0.4ML IJ SOSY
PREFILLED_SYRINGE | INTRAMUSCULAR | Status: AC
  Filled 2017-03-27: qty 0.4

## 2017-03-27 MED ORDER — DARBEPOETIN ALFA 200 MCG/0.4ML IJ SOSY
200.0000 ug | PREFILLED_SYRINGE | Freq: Once | INTRAMUSCULAR | Status: AC
  Administered 2017-03-27: 200 ug via SUBCUTANEOUS

## 2017-03-27 NOTE — Progress Notes (Signed)
[name] presents today for injection per the provider's orders.  Aranesp administration without incident; see MAR for injection details.  Patient tolerated procedure well and without incident.  No questions or complaints noted at this time.  Discharged ambulatory in c/o spouse.

## 2017-03-29 ENCOUNTER — Encounter (HOSPITAL_COMMUNITY): Payer: Medicare HMO | Attending: Oncology

## 2017-03-29 VITALS — BP 122/46 | HR 77 | Temp 98.6°F | Resp 16

## 2017-03-29 DIAGNOSIS — D509 Iron deficiency anemia, unspecified: Secondary | ICD-10-CM

## 2017-03-29 DIAGNOSIS — E1122 Type 2 diabetes mellitus with diabetic chronic kidney disease: Secondary | ICD-10-CM | POA: Insufficient documentation

## 2017-03-29 DIAGNOSIS — I129 Hypertensive chronic kidney disease with stage 1 through stage 4 chronic kidney disease, or unspecified chronic kidney disease: Secondary | ICD-10-CM | POA: Insufficient documentation

## 2017-03-29 DIAGNOSIS — D500 Iron deficiency anemia secondary to blood loss (chronic): Secondary | ICD-10-CM

## 2017-03-29 DIAGNOSIS — N184 Chronic kidney disease, stage 4 (severe): Secondary | ICD-10-CM

## 2017-03-29 DIAGNOSIS — D649 Anemia, unspecified: Secondary | ICD-10-CM | POA: Insufficient documentation

## 2017-03-29 MED ORDER — SODIUM CHLORIDE 0.9 % IV SOLN
INTRAVENOUS | Status: DC
  Administered 2017-03-29: 15:00:00 via INTRAVENOUS

## 2017-03-29 MED ORDER — SODIUM CHLORIDE 0.9 % IV SOLN
510.0000 mg | Freq: Once | INTRAVENOUS | Status: DC
  Filled 2017-03-29: qty 17

## 2017-03-29 MED ORDER — SODIUM CHLORIDE 0.9 % IV SOLN
510.0000 mg | Freq: Once | INTRAVENOUS | Status: AC
  Administered 2017-03-29: 510 mg via INTRAVENOUS
  Filled 2017-03-29: qty 17

## 2017-03-29 NOTE — Patient Instructions (Signed)
[facility] at [facility] Discharge Instructions  RECOMMENDATIONS MADE BY THE CONSULTANT AND ANY TEST RESULTS WILL BE SENT TO YOUR REFERRING PHYSICIAN.  Feraheme given  Follow up as scheduled.  Thank you for choosing [facility] at [facility] to provide your oncology and hematology care.  To afford each patient quality time with our provider, please arrive at least 15 minutes before your scheduled appointment time.    If you have a lab appointment with the Cancer Center please come in thru the  Main Entrance and check in at the main information desk  You need to re-schedule your appointment should you arrive 10 or more minutes late.  We strive to give you quality time with our providers, and arriving late affects you and other patients whose appointments are after yours.  Also, if you no show three or more times for appointments you may be dismissed from the clinic at the providers discretion.     Again, thank you for choosing [facility].  Our hope is that these requests will decrease the amount of time that you wait before being seen by our physicians.       _____________________________________________________________  Should you have questions after your visit to  Cancer Center, please contact our office at [phone] between the hours of 8:30 a.m. and 4:30 p.m.  Voicemails left after 4:30 p.m. will not be returned until the following business day.  For prescription refill requests, have your pharmacy contact our office.       Resources For Cancer Patients and their Caregivers ? American Cancer Society: Can assist with transportation, wigs, general needs, runs Look Good Feel Better.        1-888-227-6333 ? Cancer Care: Provides financial assistance, online support groups, medication/co-pay assistance.  1-800-813-HOPE (4673) ? [facility] Assists [place] cancer patients and their  families through emotional , educational and financial support.  336-427-4357 ? [place] DSS Where to apply for food stamps, Medicaid and utility assistance. 336-342-1394 ? [facility]: Transportation to medical appointments. 336-347-2287 ? Social Security Administration: May apply for disability if have a Stage IV cancer. 336-342-7796 1-800-772-1213 ? [place] Aging, Disability and Transit Services: Assists with nutrition, care and transit needs. 336-349-2343  Cancer Center Support Programs: @10RELATIVEDAYS@ > Cancer Support Group  2nd Tuesday of the month 1pm-2pm, Journey Room  > Creative Journey  3rd Tuesday of the month 1130am-1pm, Journey Room  > Look Good Feel Better  1st Wednesday of the month 10am-12 noon, Journey Room (Call American Cancer Society to register 1-800-395-5775)   

## 2017-03-29 NOTE — Progress Notes (Signed)
Treatment given per orders. Patient tolerated it well without problems. Vitals stable and discharged home from clinic ambulatory. Follow up as scheduled.  

## 2017-04-02 DIAGNOSIS — E1129 Type 2 diabetes mellitus with other diabetic kidney complication: Secondary | ICD-10-CM | POA: Diagnosis not present

## 2017-04-02 DIAGNOSIS — D649 Anemia, unspecified: Secondary | ICD-10-CM | POA: Diagnosis not present

## 2017-04-02 DIAGNOSIS — N184 Chronic kidney disease, stage 4 (severe): Secondary | ICD-10-CM | POA: Diagnosis not present

## 2017-04-02 DIAGNOSIS — I10 Essential (primary) hypertension: Secondary | ICD-10-CM | POA: Diagnosis not present

## 2017-04-08 ENCOUNTER — Telehealth: Payer: Self-pay | Admitting: "Endocrinology

## 2017-04-08 MED ORDER — INSULIN GLARGINE 300 UNIT/ML ~~LOC~~ SOPN: 20.0000 [IU] | PEN_INJECTOR | Freq: Every day | SUBCUTANEOUS | 2 refills | Status: DC

## 2017-04-08 NOTE — Telephone Encounter (Signed)
[name] is asking for a refill on Insulin Glargine (TOUJEO MAX SOLOSTAR) 300 UNIT sent to [facility], please advise?

## 2017-04-10 ENCOUNTER — Encounter (HOSPITAL_COMMUNITY): Payer: Medicare HMO

## 2017-04-10 DIAGNOSIS — D500 Iron deficiency anemia secondary to blood loss (chronic): Secondary | ICD-10-CM

## 2017-04-10 DIAGNOSIS — I129 Hypertensive chronic kidney disease with stage 1 through stage 4 chronic kidney disease, or unspecified chronic kidney disease: Secondary | ICD-10-CM | POA: Diagnosis not present

## 2017-04-10 DIAGNOSIS — D649 Anemia, unspecified: Secondary | ICD-10-CM | POA: Diagnosis not present

## 2017-04-10 DIAGNOSIS — E1122 Type 2 diabetes mellitus with diabetic chronic kidney disease: Secondary | ICD-10-CM | POA: Diagnosis not present

## 2017-04-10 DIAGNOSIS — D509 Iron deficiency anemia, unspecified: Secondary | ICD-10-CM | POA: Diagnosis not present

## 2017-04-10 DIAGNOSIS — N184 Chronic kidney disease, stage 4 (severe): Secondary | ICD-10-CM | POA: Diagnosis not present

## 2017-04-10 LAB — HEMOGLOBIN: HEMOGLOBIN: 11.9 g/dL — AB (ref 12.0–15.0)

## 2017-04-10 NOTE — Progress Notes (Signed)
Patients hgb 11.9 today and made aware no need for aranesp shot today.  Instructed the patient to keep next appointment and to call for any changes or questions.  Verbalized understanding.

## 2017-04-24 ENCOUNTER — Encounter (HOSPITAL_COMMUNITY): Payer: Medicare HMO

## 2017-04-24 DIAGNOSIS — N184 Chronic kidney disease, stage 4 (severe): Secondary | ICD-10-CM | POA: Diagnosis not present

## 2017-04-24 DIAGNOSIS — D649 Anemia, unspecified: Secondary | ICD-10-CM | POA: Diagnosis not present

## 2017-04-24 DIAGNOSIS — I129 Hypertensive chronic kidney disease with stage 1 through stage 4 chronic kidney disease, or unspecified chronic kidney disease: Secondary | ICD-10-CM | POA: Diagnosis not present

## 2017-04-24 DIAGNOSIS — D500 Iron deficiency anemia secondary to blood loss (chronic): Secondary | ICD-10-CM

## 2017-04-24 DIAGNOSIS — E1122 Type 2 diabetes mellitus with diabetic chronic kidney disease: Secondary | ICD-10-CM | POA: Diagnosis not present

## 2017-04-24 DIAGNOSIS — D509 Iron deficiency anemia, unspecified: Secondary | ICD-10-CM | POA: Diagnosis not present

## 2017-04-24 LAB — HEMOGLOBIN: Hemoglobin: 12 g/dL (ref 12.0–15.0)

## 2017-04-24 NOTE — Progress Notes (Signed)
Hgb 12 today so Aranesp injection was held per parameters. Pt given this information and verbalized understanding.Pt discharged self ambulatory in satisfactory condition accompanied by her husband

## 2017-04-26 ENCOUNTER — Other Ambulatory Visit: Payer: Self-pay | Admitting: Family Medicine

## 2017-04-29 NOTE — Telephone Encounter (Signed)
Seen [phone]   Pt is over due for an appt with dr Meda Coffee, please sch.

## 2017-04-30 ENCOUNTER — Other Ambulatory Visit: Payer: Self-pay

## 2017-04-30 ENCOUNTER — Encounter: Payer: Self-pay | Admitting: Family Medicine

## 2017-05-01 ENCOUNTER — Ambulatory Visit: Payer: Medicare HMO | Admitting: Gastroenterology

## 2017-05-03 ENCOUNTER — Ambulatory Visit: Payer: Medicare HMO | Admitting: Family Medicine

## 2017-05-03 ENCOUNTER — Ambulatory Visit: Payer: Medicare HMO | Admitting: Gastroenterology

## 2017-05-07 ENCOUNTER — Ambulatory Visit: Payer: Medicare HMO | Admitting: Family Medicine

## 2017-05-08 ENCOUNTER — Encounter (HOSPITAL_COMMUNITY): Payer: Medicare HMO

## 2017-05-08 ENCOUNTER — Encounter (HOSPITAL_COMMUNITY): Payer: Medicare HMO | Attending: Oncology

## 2017-05-08 DIAGNOSIS — E1122 Type 2 diabetes mellitus with diabetic chronic kidney disease: Secondary | ICD-10-CM | POA: Insufficient documentation

## 2017-05-08 DIAGNOSIS — N184 Chronic kidney disease, stage 4 (severe): Secondary | ICD-10-CM | POA: Diagnosis not present

## 2017-05-08 DIAGNOSIS — Z794 Long term (current) use of insulin: Secondary | ICD-10-CM | POA: Diagnosis not present

## 2017-05-08 DIAGNOSIS — D509 Iron deficiency anemia, unspecified: Secondary | ICD-10-CM | POA: Diagnosis not present

## 2017-05-08 DIAGNOSIS — I252 Old myocardial infarction: Secondary | ICD-10-CM | POA: Insufficient documentation

## 2017-05-08 DIAGNOSIS — I5042 Chronic combined systolic (congestive) and diastolic (congestive) heart failure: Secondary | ICD-10-CM | POA: Insufficient documentation

## 2017-05-08 DIAGNOSIS — Z79899 Other long term (current) drug therapy: Secondary | ICD-10-CM | POA: Diagnosis not present

## 2017-05-08 DIAGNOSIS — D631 Anemia in chronic kidney disease: Secondary | ICD-10-CM | POA: Insufficient documentation

## 2017-05-08 DIAGNOSIS — I130 Hypertensive heart and chronic kidney disease with heart failure and stage 1 through stage 4 chronic kidney disease, or unspecified chronic kidney disease: Secondary | ICD-10-CM | POA: Diagnosis not present

## 2017-05-08 DIAGNOSIS — D500 Iron deficiency anemia secondary to blood loss (chronic): Secondary | ICD-10-CM

## 2017-05-08 DIAGNOSIS — Z9889 Other specified postprocedural states: Secondary | ICD-10-CM | POA: Insufficient documentation

## 2017-05-08 DIAGNOSIS — E785 Hyperlipidemia, unspecified: Secondary | ICD-10-CM | POA: Insufficient documentation

## 2017-05-08 LAB — HEMOGLOBIN: Hemoglobin: 12.8 g/dL (ref 12.0–15.0)

## 2017-05-08 NOTE — Progress Notes (Signed)
Hgb 12.8 today. Aranesp injection held per parameters. Pt given this information and verbalized understanding

## 2017-05-20 ENCOUNTER — Other Ambulatory Visit: Payer: Self-pay

## 2017-05-20 ENCOUNTER — Ambulatory Visit (INDEPENDENT_AMBULATORY_CARE_PROVIDER_SITE_OTHER): Payer: Medicare HMO | Admitting: Family Medicine

## 2017-05-20 ENCOUNTER — Encounter: Payer: Self-pay | Admitting: Family Medicine

## 2017-05-20 VITALS — BP 138/70 | HR 64 | Temp 98.2°F | Resp 18 | Ht 64.0 in | Wt 181.0 lb

## 2017-05-20 DIAGNOSIS — E1122 Type 2 diabetes mellitus with diabetic chronic kidney disease: Secondary | ICD-10-CM | POA: Diagnosis not present

## 2017-05-20 DIAGNOSIS — N185 Chronic kidney disease, stage 5: Secondary | ICD-10-CM

## 2017-05-20 DIAGNOSIS — E785 Hyperlipidemia, unspecified: Secondary | ICD-10-CM

## 2017-05-20 DIAGNOSIS — I10 Essential (primary) hypertension: Secondary | ICD-10-CM | POA: Diagnosis not present

## 2017-05-20 DIAGNOSIS — D649 Anemia, unspecified: Secondary | ICD-10-CM

## 2017-05-20 DIAGNOSIS — E1169 Type 2 diabetes mellitus with other specified complication: Secondary | ICD-10-CM

## 2017-05-20 NOTE — Patient Instructions (Signed)
See me in 3 months You are doing well Pneumonia shot today Need urine test for protein   Still need old records ( call again)

## 2017-05-20 NOTE — Progress Notes (Addendum)
Chief Complaint  Patient presents with  . Follow-up   Patient is here for follow-up.  She states she feels well. She was admitted on more than one occasion for symptomatic anemia.  It was thought due to GI blood loss.  She states that now her hemoglobin is stable.  Her last measured hemoglobin is 12.  She has more energy and feels back to herself. Is under the care of Dr. Dorris Fetch for her diabetes management.  She is currently using Jardiance.  She states her sugars have been controlled.  She has not had any symptomatic high or low sugars. Her lipids are not well controlled.  She is unable to take statins. Her blood pressure is well controlled.  She is compliant with medication.  Patient Active Problem List   Diagnosis Date Noted  . Acute on chronic anemia   . Hypothyroidism 12/13/2016  . Iron deficiency anemia 11/08/2016  . Normocytic anemia 10/26/2016  . Elevated troponin   . Melena   . Aortic atherosclerosis ([place]) 10/25/2016  . Symptomatic anemia 10/04/2016  . Acute kidney injury superimposed on CKD ([place]) 10/04/2016  . History of non-ST elevation myocardial infarction (NSTEMI) 10/04/2016  . Hyperlipidemia 10/04/2016  . Neuropathy 09/20/2016  . Chronic kidney disease, stage IV (severe) ([place]) 08/03/2015  . Hip fracture ([place]) 02/05/2015  . Diabetes mellitus with stage 4 chronic kidney disease ([place]) 02/05/2015  . HTN (hypertension) 02/05/2015  . Gout 02/05/2015  . Left humeral fracture 02/05/2015  . Hyperlipidemia associated with type 2 diabetes mellitus ([place]) 05/01/2013    Outpatient Encounter Medications as of 05/20/2017  Medication Sig  . amLODipine (NORVASC) 10 MG tablet Take 1 tablet (10 mg total) by mouth daily.  . furosemide (LASIX) 20 MG tablet Take 20 mg by mouth as needed. Only has to take it when she gains 2 lbs  . gabapentin (NEURONTIN) 300 MG capsule Take 2 capsules by mouth at bedtime.   . hydrALAZINE (APRESOLINE) 25 MG tablet TAKE 1 TABLET BY MOUTH EVERY 8 HOURS  AS NEEDED  . Insulin Glargine (TOUJEO MAX SOLOSTAR) 300 UNIT/ML SOPN Inject 20 Units at bedtime into the skin.  . Insulin Pen Needle (B-D ULTRAFINE III SHORT PEN) 31G X 8 MM MISC 1 each by Does not apply route as directed.  . Insulin Pen Needle (PEN NEEDLES) 32G X 6 MM MISC 1 each by Does not apply route at bedtime.  Marland Kitchen levothyroxine (SYNTHROID, LEVOTHROID) 50 MCG tablet Take 1 tablet (50 mcg total) by mouth daily before breakfast.  . metoprolol tartrate (LOPRESSOR) 25 MG tablet Take 12.5 mg by mouth 2 (two) times daily.  . Multiple Vitamin (MULTIVITAMIN WITH MINERALS) TABS tablet Take 1 tablet by mouth daily.  . ondansetron (ZOFRAN) 4 MG tablet Take 1 tablet (4 mg total) by mouth every 8 (eight) hours as needed for nausea or vomiting.  . pantoprazole (PROTONIX) 40 MG tablet Take 1 tablet (40 mg total) by mouth daily. 30 minutes before breakfast  . ULORIC 40 MG tablet Take 1 tablet by mouth daily.   No facility-administered encounter medications on file as of 05/20/2017.     Allergies  Allergen Reactions  . Insulin Glargine Swelling    "Makes me swell like a balloon all over", including face, but without any respiratory distress or rashes. Associated with weight gain.  . Statins Other (See Comments)    "I've tried them all; my muscle aches were so bad I couldn't walk".    Review of Systems  Constitutional: Negative  for activity change, appetite change and unexpected weight change.  HENT: Negative for congestion, dental problem, postnasal drip and rhinorrhea.   Eyes: Negative for redness and visual disturbance.  Respiratory: Negative for cough and shortness of breath.   Cardiovascular: Negative for chest pain, palpitations and leg swelling.  Gastrointestinal: Negative for abdominal pain, constipation and diarrhea.  Genitourinary: Negative for difficulty urinating and frequency.  Musculoskeletal: Negative for arthralgias and back pain.  Neurological: Negative for dizziness and headaches.    Psychiatric/Behavioral: Negative for dysphoric mood and sleep disturbance. The patient is not nervous/anxious.   Patient denies any complaints, states feels well  BP 138/70   Pulse 64   Temp 98.2 F (36.8 C) (Temporal)   Resp 18   Ht 5\' 4"  (1.626 m)   Wt 181 lb (82.1 kg)   SpO2 97%   BMI 31.07 kg/m   Physical Exam  Constitutional: She is oriented to person, place, and time. She appears well-developed and well-nourished. No distress.  HENT:  Head: Normocephalic and atraumatic.  Mouth/Throat: Oropharynx is clear and moist.  Eyes: Conjunctivae are normal. Pupils are equal, round, and reactive to light.  Neck: Normal range of motion.  Cardiovascular: Normal rate.  Murmur heard. Pulses:      Dorsalis pedis pulses are 1+ on the right side, and 1+ on the left side.       Posterior tibial pulses are 1+ on the right side, and 1+ on the left side.  Pulmonary/Chest: Effort normal and breath sounds normal. She has no rales.  Abdominal: Soft. Bowel sounds are normal.  Musculoskeletal: She exhibits edema.       Right foot: There is normal range of motion and no deformity.       Left foot: There is normal range of motion and no deformity.  One +  Feet:  Right Foot:  Protective Sensation: 4 sites tested. 1 site sensed. Skin Integrity: Positive for callus. Negative for ulcer or blister.  Left Foot:  Protective Sensation: 4 sites tested. 1 site sensed. Skin Integrity: Positive for callus. Negative for ulcer or blister.  Lymphadenopathy:    She has no cervical adenopathy.  Neurological: She is alert and oriented to person, place, and time.  Psychiatric: She has a normal mood and affect. Her behavior is normal.    ASSESSMENT/PLAN:  1. Essential hypertension Controlled  2. Hyperlipidemia associated with type 2 diabetes mellitus ([place]) Improving control under care of Dr. Dorris Fetch  3. Type 2 diabetes mellitus with stage 5 chronic kidney disease not on chronic dialysis, without long-term  current use of insulin ([facility]) Her last measured GFR was improved. - Microalbumin / creatinine urine ratio  4. Normocytic anemia Improving   Patient Instructions  See me in 3 months You are doing well Pneumonia shot today Need urine test for protein   Still need old records ( call again)   [name], MD

## 2017-05-21 NOTE — Progress Notes (Signed)
[name], [place] [address]   CLINIC:  Medical Oncology/Hematology  PCP:  [name], MD [phone] [address]. [address] [place] [address] [phone]   REASON FOR VISIT:  Follow-up for Anemia  CURRENT THERAPY: Aranesp 200 mcg every 2 weeks & IV iron prn      HISTORY OF PRESENT ILLNESS:  (From Dr. Laverle Patter note on 03/19/17)  Briana Booty D Croteauis a 76 y.o.femalewith a medical history significant for chronic renal disease, Stage IV, DM, history of NSTEMI, HTN, hyperlipidemia, who is referred to the [facility] for normocytic, normochromic anemia with preservation of WBC and platelet count in the setting of chronic renal disease and POSITIVE stool cards.  Patient was admitted to [facility] on 10/25/2016 discharged on 10/29/2016 when she was found at home unresponsive per patient's husband. Patient reported that she was feeling fatigued over the last several days and complained of black stools. The patient does take iron tablets. In the emergency department, the patient was found to have severe anemia with a hemoglobin of 3.8 g/dL. she received 4 units of packed red blood cells. She also has a history of requiring 4 units of packed red blood cells on 10/04/2016 as well. During her hospitalization, she was found to have fecal occult positive stool cards on 10/29/2016 2.  She is S/P colonoscopy by Dr. Ronnette Juniper on 5/14/2018which revealed 1 5 mm polyp in the transverse colon and resection was not attempted due to anticoagulation. Diverticulosis in sigmoid colon and nonbleeding internal hemorrhoids were noted as well. Examination was otherwise negative. She also went a camera capsule study on 10/08/2016 by the same position showing no mucosal abnormality within the small bowel.  She reports a history of melanoma. She notes that this has improved and resolved. She admits to a good energy level and denies any weakness. She  notes her stamina is good as well. She walks at [facility] frequently for exercise.  She interestingly admits to pica having consumed a crushed ice for years. She reports [name] as the best ice.  Her anemia workup demonstrated a mild iron deficiency with a ferritin of 39. The rest of her anemia workup including a vitamin B12 level, folate level, SPEP/IFE, LDH were within normal limits. Patient received 2 doses of Injectafer on 11/15/16 and 12/03/16.     INTERVAL HISTORY:  [name] 76 y.o. female returns for routine follow-up for anemia.   Here today with her partner, [name].    Overall, she tells me she has been feeling very well.  Appetite and energy levels both 100%.  She denies any pain.  She has intermittent bilateral lower extremity edema, which she attributes to her gout.  She tells me about 3 different hospitalizations in the past ~8 months, each for rectal bleeding, along with other issues.  She has had no hospitalizations since her last visit to the cancer center.   She is largely without hematologic concerns today.  Denies any frank bleeding episodes including blood in her stools, hematuria, vaginal bleeding, hemoptysis, nosebleeds, or gingival bleeding.  Denies dark/tarry stools. She is no longer taking oral iron supplements.   She has not required Aranesp injection since 03/27/17, as her Hgb has been >11 g/dL.  She received most recent doses of IV iron on 03/22/17 and 03/29/17; she tolerated infusions well.    She sees Dr. Dorris Fetch for her diabetes management. She sees Dr. Hinda Lenis for her CKD.  IV iron administration record:  Oncology Flowsheet 11/15/2016 12/03/2016  ferric carboxymaltose (INJECTAFER) IV 750 mg 750 mg   Oncology Flowsheet 03/22/2017  ferumoxytol [facility]) IV 510 mg   Oncology Flowsheet 03/29/2017  ferumoxytol (FERAHEME) IV 510 mg        REVIEW OF SYSTEMS:  Review of Systems  Constitutional: Negative.  Negative for chills, fatigue and fever.  HENT:   Negative.  Negative for lump/mass and nosebleeds.   Eyes: Negative.   Respiratory: Negative.  Negative for cough and shortness of breath.   Cardiovascular: Positive for leg swelling. Negative for chest pain.  Gastrointestinal: Negative.  Negative for abdominal pain, blood in stool, constipation, diarrhea, nausea and vomiting.  Endocrine: Negative.   Genitourinary: Negative.  Negative for dysuria and hematuria.   Musculoskeletal: Negative.  Negative for arthralgias.  Skin: Negative.  Negative for rash.  Neurological: Negative.  Negative for dizziness and headaches.  Hematological: Negative.  Negative for adenopathy. Does not bruise/bleed easily.  Psychiatric/Behavioral: Negative.  Negative for depression and sleep disturbance. The patient is not nervous/anxious.      PAST MEDICAL/SURGICAL HISTORY:  Past Medical History:  Diagnosis Date  . Anemia   . Arthritis   . Blood transfusion without reported diagnosis   . Cataract   . Chronic anemia   . Chronic combined systolic and diastolic CHF (congestive heart failure) ([place])    a. 2D echo 08/2016 at [facility]: EF 50-55% with inferior wall HK, impaired LV filling, fair study.  . CKD (chronic kidney disease), stage III ([place])   . Diabetes ([place])   . Gastritis   . GERD (gastroesophageal reflux disease)   . Gout   . HTN (hypertension)   . Hyperlipidemia   . Iron deficiency anemia 11/08/2016  . Normocytic anemia 10/26/2016  . NSTEMI (non-ST elevated myocardial infarction) ([place])    a. Complex admission 08/2016 - with severe hyperglycemia, AKI on CKD, severe anemia down to Hgb 6.8, acute combined CHF, troponin of 8.5, cath deferred due to renal dysfunction.  . Thyroid disease    Past Surgical History:  Procedure Laterality Date  . COLONOSCOPY WITH PROPOFOL N/A 10/08/2016   Procedure: COLONOSCOPY WITH PROPOFOL;  Surgeon: Ronnette Juniper, MD;  Location: [facility];  Service: Gastroenterology;  Laterality: N/A;  . ESOPHAGOGASTRODUODENOSCOPY N/A 10/06/2016    Procedure: ESOPHAGOGASTRODUODENOSCOPY (EGD);  Surgeon: Otis Brace, MD;  Location[facility] ENDOSCOPY;  Service: Gastroenterology;  Laterality: N/A;  . ESOPHAGOGASTRODUODENOSCOPY (EGD) WITH PROPOFOL N/A 03/12/2017   Procedure: ESOPHAGOGASTRODUODENOSCOPY (EGD) WITH PROPOFOL;  Surgeon: Danie Binder, MD;  [name];  Service: Endoscopy;  Laterality: N/A;  . GIVENS CAPSULE STUDY  10/08/2016   Procedure: GIVENS CAPSULE STUDY;  Surgeon: Ronnette Juniper, MD;  Location: [facility];  Service: Gastroenterology;;  . Freda Munro CAPSULE STUDY N/A 10/29/2016   Procedure: GIVENS CAPSULE STUDY;  Surgeon: Danie Binder, MD;  [name];  Service: Endoscopy;  Laterality: N/A;  . ORIF HUMERUS FRACTURE Left 02/07/2015   Procedure: OPEN REDUCTION INTERNAL FIXATION (ORIF) PROXIMAL HUMERUS FRACTURE;  Surgeon: Marybelle Killings, MD;  Location: [facility];  Service: Orthopedics;  Laterality: Left;  . THYROID SURGERY    . TOTAL HIP ARTHROPLASTY Left 02/07/2015   Procedure: TOTAL HIP ARTHROPLASTY ANTERIOR APPROACH ;  Surgeon: Marybelle Killings, MD;  Location: [place] [facility];  Service: Orthopedics;  Laterality: Left;  . WRIST SURGERY Left      SOCIAL HISTORY:  Social History   Socioeconomic History  . Marital status: Widowed    Spouse name: [name]  .  Number of children: 2  . Years of education: 26  . Highest education level: Not on file  Social Needs  . Financial resource strain: Not on file  . Food insecurity - worry: Not on file  . Food insecurity - inability: Not on file  . Transportation needs - medical: Not on file  . Transportation needs - non-medical: Not on file  Occupational History  . Occupation: retired  Tobacco Use  . Smoking status: Never Smoker  . Smokeless tobacco: Never Used  Substance and Sexual Activity  . Alcohol use: No    Alcohol/week: 0.0 oz  . Drug use: No  . Sexual activity: Not Currently  Other Topics Concern  . Not on file  Social History Narrative   Lives with significant  other/partner [name]   He is her caregiver   Moved from [facility]    FAMILY HISTORY:  Family History  Problem Relation Age of Onset  . Diabetes Mother   . Asthma Mother   . Early death Mother 69       Pneumonia  . Early death Father        Killed at rodeo  . CAD Neg Hx   . GI Bleed Neg Hx     CURRENT MEDICATIONS:  Outpatient Encounter Medications as of 05/22/2017  Medication Sig  . amLODipine (NORVASC) 10 MG tablet Take 1 tablet (10 mg total) by mouth daily.  . furosemide (LASIX) 20 MG tablet Take 20 mg by mouth as needed. Only has to take it when she gains 2 lbs  . gabapentin (NEURONTIN) 300 MG capsule Take 2 capsules by mouth at bedtime.   . hydrALAZINE (APRESOLINE) 25 MG tablet TAKE 1 TABLET BY MOUTH EVERY 8 HOURS AS NEEDED  . Insulin Glargine (TOUJEO MAX SOLOSTAR) 300 UNIT/ML SOPN Inject 20 Units at bedtime into the skin.  . Insulin Pen Needle (B-D ULTRAFINE III SHORT PEN) 31G X 8 MM MISC 1 each by Does not apply route as directed.  . Insulin Pen Needle (PEN NEEDLES) 32G X 6 MM MISC 1 each by Does not apply route at bedtime.  Marland Kitchen levothyroxine (SYNTHROID, LEVOTHROID) 50 MCG tablet Take 1 tablet (50 mcg total) by mouth daily before breakfast.  . metoprolol tartrate (LOPRESSOR) 25 MG tablet Take 12.5 mg by mouth 2 (two) times daily.  . Multiple Vitamin (MULTIVITAMIN WITH MINERALS) TABS tablet Take 1 tablet by mouth daily.  . ondansetron (ZOFRAN) 4 MG tablet Take 1 tablet (4 mg total) by mouth every 8 (eight) hours as needed for nausea or vomiting.  . pantoprazole (PROTONIX) 40 MG tablet Take 1 tablet (40 mg total) by mouth daily. 30 minutes before breakfast  . ULORIC 40 MG tablet Take 1 tablet by mouth daily.   No facility-administered encounter medications on file as of 05/22/2017.     ALLERGIES:  Allergies  Allergen Reactions  . Insulin Glargine Swelling    "Makes me swell like a balloon all over", including face, but without any respiratory distress or rashes. Associated with  weight gain.  . Statins Other (See Comments)    "I've tried them all; my muscle aches were so bad I couldn't walk".     PHYSICAL EXAM:  ECOG Performance status: 0 - Asymptomatic   Vitals:   05/22/17 1354  BP: (!) 157/58  Pulse: 60  Resp: 16  Temp: 97.8 F (36.6 C)  SpO2: 98%   Filed Weights   05/22/17 1354  Weight: 177 lb 11.2 oz (80.6 kg)  Physical Exam  Constitutional: She is oriented to person, place, and time and well-developed, well-nourished, and in no distress.  HENT:  Head: Normocephalic.  Mouth/Throat: Oropharynx is clear and moist. No oropharyngeal exudate.  Eyes: Conjunctivae are normal. Pupils are equal, round, and reactive to light. No scleral icterus.  Neck: Normal range of motion. Neck supple.  Cardiovascular: Normal rate, regular rhythm and normal heart sounds.  Pulmonary/Chest: Effort normal and breath sounds normal. No respiratory distress. She has no wheezes.  Abdominal: Soft. Bowel sounds are normal. There is no tenderness.  Musculoskeletal: Normal range of motion. She exhibits edema (1+ BLE/pedal edema ).  Lymphadenopathy:    She has no cervical adenopathy.       Right: No supraclavicular adenopathy present.       Left: No supraclavicular adenopathy present.  Neurological: She is alert and oriented to person, place, and time. No cranial nerve deficit. Gait normal.  Skin: Skin is warm and dry. No rash noted.  Psychiatric: Mood, memory, affect and judgment normal.  Nursing note and vitals reviewed.    LABORATORY DATA:  I have reviewed the labs as listed.  CBC    Component Value Date/Time   WBC 7.1 05/22/2017 1456   RBC 4.37 05/22/2017 1456   HGB 13.0 05/22/2017 1456   HCT 38.7 05/22/2017 1456   PLT 331 05/22/2017 1456   MCV 88.6 05/22/2017 1456   MCH 29.7 05/22/2017 1456   MCHC 33.6 05/22/2017 1456   RDW 13.9 05/22/2017 1456   LYMPHSABS 1.4 05/22/2017 1456   MONOABS 0.5 05/22/2017 1456   EOSABS 0.5 05/22/2017 1456   BASOSABS 0.1  05/22/2017 1456   CMP Latest Ref Rng & Units 03/19/2017 03/14/2017 03/12/2017  Glucose 65 - 99 mg/dL 225(H) 252(H) 115(H)  BUN 6 - 20 mg/dL 45(H) 57(H) 73(H)  Creatinine 0.44 - 1.00 mg/dL 1.82(H) 1.98(H) 2.15(H)  Sodium 135 - 145 mmol/L 139 138 142  Potassium 3.5 - 5.1 mmol/L 4.2 4.0 3.8  Chloride 101 - 111 mmol/L 111 109 110  CO2 22 - 32 mmol/L 20(L) 21(L) 22  Calcium 8.9 - 10.3 mg/dL 8.5(L) 8.0(L) 8.4(L)  Total Protein 6.5 - 8.1 g/dL 6.0(L) 5.3(L) 5.1(L)  Total Bilirubin 0.3 - 1.2 mg/dL 0.3 0.3 0.4  Alkaline Phos 38 - 126 U/L 82 62 52  AST 15 - 41 U/L 17 28 13(L)  ALT 14 - 54 U/L 14 13(L) 11(L)    PENDING LABS:    DIAGNOSTIC IMAGING:    PATHOLOGY:       ASSESSMENT & PLAN:   Anemia d/t CKD:  -Received 1 dose of Aranesp 200 mcg on 03/27/17 with excellent response in Hgb since that time (Hgb >11 g/dL after 1 dose of ESA therapy).  -Hgb today excellent at 12.8 g/dL. Her hgb has actually increased since last dose of Aranesp. I think we can stop Aranesp for now; I suspect her iron deficiency may have been the bigger contributor to her anemia than her kidney disease.  Discussed several of the etiologies for anemia with patient/family today.  Explained the role that each treatment plays in managing the causes of anemia.  Will plan on collecting labs regularly; if we begin to see a trend of decreased Hgb with adequate iron stores, then can discuss resuming ESA therapy.  -Patient only had Hgb lab collected today prior to visit; will collect CMET after visit and contact her with results.  Continue follow-up with Dr. Hinda Lenis with nephrology as directed.   -Will collect labs only every  1 month -Return to cancer center in 3 months for follow-up with labs.    Iron deficiency anemia:  -Thought to be d/t chronic GI blood loss with documented h/o heme + stools/melena.   -Received 4 doses of IV iron thus far Oncology Flowsheet 11/15/2016 12/03/2016  ferric carboxymaltose (INJECTAFER) IV 750  mg 750 mg   Oncology Flowsheet 03/22/2017 03/29/2017  ferumoxytol (FERAHEME) IV 510 mg 510 mg   -Hgb excellent at 12.8 g/dL today. Clinically, she feels very well and denies any recent frank bleeding episodes or dark/tarry stools.  Iron studies were not collected prior to visit; will add-on this lab work today and contact her with results. Will help make arrangements for additional IV iron, if clinically indicated.    -Continue follow-up with Dr. Oneida Alar with GI as directed.  -Labs only monthly -Return to cancer center in 3 months for follow-up. Instructed her to call us with any signs/symptoms of active bleeding or dark/tarry stools, or other concerns. We would be happy to see her sooner for blood work or office visit if needed.         Dispo:  -Stop Aranesp.  -Labs only monthly. (CBC with diff, iron studies) -Return to cancer center in 3 months for follow-up with labs. (CBC with diff, CMET, iron studies)   All questions were answered to patient's stated satisfaction. Encouraged patient to call with any new concerns or questions before her next visit to the cancer center and we can certain see her sooner, if needed.      Orders placed this encounter:  Orders Placed This Encounter  Procedures  . CBC with Differential/Platelet  . Comprehensive metabolic panel  . Ferritin  . Iron and TIBC  . CBC with Differential/Platelet  . Comprehensive metabolic panel  . Ferritin  . Iron and TIBC      Mike Craze, NP [place] [phone]

## 2017-05-22 ENCOUNTER — Other Ambulatory Visit: Payer: Self-pay

## 2017-05-22 ENCOUNTER — Encounter (HOSPITAL_COMMUNITY): Payer: Medicare HMO

## 2017-05-22 ENCOUNTER — Encounter (HOSPITAL_COMMUNITY): Payer: Self-pay | Admitting: Adult Health

## 2017-05-22 ENCOUNTER — Encounter (HOSPITAL_BASED_OUTPATIENT_CLINIC_OR_DEPARTMENT_OTHER): Payer: Medicare HMO | Admitting: Adult Health

## 2017-05-22 VITALS — BP 157/58 | HR 60 | Temp 97.8°F | Resp 16 | Wt 177.7 lb

## 2017-05-22 DIAGNOSIS — N184 Chronic kidney disease, stage 4 (severe): Secondary | ICD-10-CM

## 2017-05-22 DIAGNOSIS — D500 Iron deficiency anemia secondary to blood loss (chronic): Secondary | ICD-10-CM

## 2017-05-22 DIAGNOSIS — D631 Anemia in chronic kidney disease: Secondary | ICD-10-CM

## 2017-05-22 DIAGNOSIS — E1122 Type 2 diabetes mellitus with diabetic chronic kidney disease: Secondary | ICD-10-CM | POA: Diagnosis not present

## 2017-05-22 DIAGNOSIS — I5042 Chronic combined systolic (congestive) and diastolic (congestive) heart failure: Secondary | ICD-10-CM | POA: Diagnosis not present

## 2017-05-22 DIAGNOSIS — N189 Chronic kidney disease, unspecified: Secondary | ICD-10-CM | POA: Diagnosis not present

## 2017-05-22 DIAGNOSIS — D509 Iron deficiency anemia, unspecified: Secondary | ICD-10-CM

## 2017-05-22 DIAGNOSIS — I252 Old myocardial infarction: Secondary | ICD-10-CM | POA: Diagnosis not present

## 2017-05-22 DIAGNOSIS — Z9889 Other specified postprocedural states: Secondary | ICD-10-CM | POA: Diagnosis not present

## 2017-05-22 DIAGNOSIS — I130 Hypertensive heart and chronic kidney disease with heart failure and stage 1 through stage 4 chronic kidney disease, or unspecified chronic kidney disease: Secondary | ICD-10-CM | POA: Diagnosis not present

## 2017-05-22 DIAGNOSIS — E785 Hyperlipidemia, unspecified: Secondary | ICD-10-CM | POA: Diagnosis not present

## 2017-05-22 LAB — IRON AND TIBC
Iron: 57 ug/dL (ref 28–170)
SATURATION RATIOS: 20 % (ref 10.4–31.8)
TIBC: 284 ug/dL (ref 250–450)
UIBC: 227 ug/dL

## 2017-05-22 LAB — CBC WITH DIFFERENTIAL/PLATELET
BASOS ABS: 0.1 10*3/uL (ref 0.0–0.1)
Basophils Relative: 1 %
EOS ABS: 0.5 10*3/uL (ref 0.0–0.7)
EOS PCT: 7 %
HCT: 38.7 % (ref 36.0–46.0)
Hemoglobin: 13 g/dL (ref 12.0–15.0)
Lymphocytes Relative: 20 %
Lymphs Abs: 1.4 10*3/uL (ref 0.7–4.0)
MCH: 29.7 pg (ref 26.0–34.0)
MCHC: 33.6 g/dL (ref 30.0–36.0)
MCV: 88.6 fL (ref 78.0–100.0)
MONO ABS: 0.5 10*3/uL (ref 0.1–1.0)
Monocytes Relative: 6 %
Neutro Abs: 4.6 10*3/uL (ref 1.7–7.7)
Neutrophils Relative %: 66 %
PLATELETS: 331 10*3/uL (ref 150–400)
RBC: 4.37 MIL/uL (ref 3.87–5.11)
RDW: 13.9 % (ref 11.5–15.5)
WBC: 7.1 10*3/uL (ref 4.0–10.5)

## 2017-05-22 LAB — FERRITIN: Ferritin: 339 ng/mL — ABNORMAL HIGH (ref 11–307)

## 2017-05-22 LAB — COMPREHENSIVE METABOLIC PANEL
ALBUMIN: 4 g/dL (ref 3.5–5.0)
ALK PHOS: 93 U/L (ref 38–126)
ALT: 14 U/L (ref 14–54)
AST: 18 U/L (ref 15–41)
Anion gap: 12 (ref 5–15)
BILIRUBIN TOTAL: 0.4 mg/dL (ref 0.3–1.2)
BUN: 58 mg/dL — AB (ref 6–20)
CALCIUM: 9.3 mg/dL (ref 8.9–10.3)
CO2: 21 mmol/L — ABNORMAL LOW (ref 22–32)
CREATININE: 2.19 mg/dL — AB (ref 0.44–1.00)
Chloride: 107 mmol/L (ref 101–111)
GFR calc Af Amer: 24 mL/min — ABNORMAL LOW (ref >60)
GFR, EST NON AFRICAN AMERICAN: 21 mL/min — AB (ref >60)
GLUCOSE: 160 mg/dL — AB (ref 65–99)
Potassium: 4.1 mmol/L (ref 3.5–5.1)
Sodium: 140 mmol/L (ref 135–145)
TOTAL PROTEIN: 7.6 g/dL (ref 6.5–8.1)

## 2017-05-22 LAB — HEMOGLOBIN: HEMOGLOBIN: 12.8 g/dL (ref 12.0–15.0)

## 2017-05-22 NOTE — Patient Instructions (Addendum)
[place] at [facility] Discharge Instructions  RECOMMENDATIONS MADE BY THE CONSULTANT AND ANY TEST RESULTS WILL BE SENT TO YOUR REFERRING PHYSICIAN.  You were seen today by Mike Craze, NP. Continue monthly labs. Return in 3 months for labs and follow up. See schedulers up front for appointments   Thank you for choosing [place] at [facility] to provide your oncology and hematology care.  To afford each patient quality time with our provider, please arrive at least 15 minutes before your scheduled appointment time.    If you have a lab appointment with the [facility] please come in thru the  Main Entrance and check in at the main information desk  You need to re-schedule your appointment should you arrive 10 or more minutes late.  We strive to give you quality time with our providers, and arriving late affects you and other patients whose appointments are after yours.  Also, if you no show three or more times for appointments you may be dismissed from the clinic at the providers discretion.     Again, thank you for choosing [facility].  Our hope is that these requests will decrease the amount of time that you wait before being seen by our physicians.       _____________________________________________________________  Should you have questions after your visit to [facility], please contact our office at [phone] between the hours of 8:30 a.m. and 4:30 p.m.  Voicemails left after 4:30 p.m. will not be returned until the following business day.  For prescription refill requests, have your pharmacy contact our office.       Resources For Cancer Patients and their Caregivers ? American Cancer Society: Can assist with transportation, wigs, general needs, runs Look Good Feel Better.        (925) 434-6209 ? Cancer Care: Provides financial assistance, online support groups, medication/co-pay  assistance.  1-800-813-HOPE (872)662-8695) ? [place] Assists [place] cancer patients and their families through emotional , educational and financial support.  (317)335-0626 ? [place] DSS Where to apply for food stamps, Medicaid and utility assistance. (763)042-5063 ? [employer]: Transportation to medical appointments. 8327369739 ? Social Security Administration: May apply for disability if have a Stage IV cancer. 330 826 7605 (737)170-6511 ? [employer], Disability and Transit Services: Assists with nutrition, care and transit needs. [place] Support Programs: @10RELATIVEDAYS @ > Cancer Support Group  2nd Tuesday of the month 1pm-2pm, Journey Room  > Creative Journey  3rd Tuesday of the month 1130am-1pm, Journey Room  > Look Good Feel Better  1st Wednesday of the month 10am-12 noon, Journey Room (Call [place] to register [phone])

## 2017-05-22 NOTE — Progress Notes (Signed)
Hgb 12.8 today. Aranesp held

## 2017-05-31 ENCOUNTER — Encounter: Payer: Self-pay | Admitting: Family Medicine

## 2017-06-03 ENCOUNTER — Telehealth: Payer: Self-pay | Admitting: Family Medicine

## 2017-06-03 NOTE — Telephone Encounter (Signed)
Patient is calling in to check the status of her rx request for allopurinol. Cb#: [place] in [place].

## 2017-06-03 NOTE — Telephone Encounter (Signed)
Left message to call office

## 2017-06-04 ENCOUNTER — Other Ambulatory Visit: Payer: Self-pay

## 2017-06-04 MED ORDER — GABAPENTIN 300 MG PO CAPS: 600.0000 mg | ORAL_CAPSULE | Freq: Every day | ORAL | 1 refills | Status: DC

## 2017-06-04 MED ORDER — FEBUXOSTAT 40 MG PO TABS: 40.0000 mg | ORAL_TABLET | Freq: Every day | ORAL | 3 refills | Status: DC

## 2017-06-04 NOTE — Telephone Encounter (Signed)
I sent her a my chart message.  She cannot take allopurinol with kidney failure

## 2017-06-04 NOTE — Telephone Encounter (Signed)
Called [place], aware, new rx for uloric sent.

## 2017-06-12 ENCOUNTER — Other Ambulatory Visit (HOSPITAL_COMMUNITY)
Admission: RE | Admit: 2017-06-12 | Discharge: 2017-06-12 | Disposition: A | Discharge status: Home / Self Care | Payer: Medicare HMO | Source: Ambulatory Visit | Attending: "Endocrinology | Admitting: "Endocrinology

## 2017-06-12 DIAGNOSIS — N184 Chronic kidney disease, stage 4 (severe): Secondary | ICD-10-CM | POA: Diagnosis not present

## 2017-06-12 DIAGNOSIS — E039 Hypothyroidism, unspecified: Secondary | ICD-10-CM | POA: Diagnosis not present

## 2017-06-12 DIAGNOSIS — E1122 Type 2 diabetes mellitus with diabetic chronic kidney disease: Secondary | ICD-10-CM | POA: Diagnosis not present

## 2017-06-12 LAB — COMPREHENSIVE METABOLIC PANEL
ALBUMIN: 3.8 g/dL (ref 3.5–5.0)
ALT: 14 U/L (ref 14–54)
AST: 18 U/L (ref 15–41)
Alkaline Phosphatase: 79 U/L (ref 38–126)
Anion gap: 12 (ref 5–15)
BILIRUBIN TOTAL: 0.6 mg/dL (ref 0.3–1.2)
BUN: 53 mg/dL — AB (ref 6–20)
CHLORIDE: 103 mmol/L (ref 101–111)
CO2: 24 mmol/L (ref 22–32)
Calcium: 9.4 mg/dL (ref 8.9–10.3)
Creatinine, Ser: 2.21 mg/dL — ABNORMAL HIGH (ref 0.44–1.00)
GFR calc Af Amer: 24 mL/min — ABNORMAL LOW (ref >60)
GFR calc non Af Amer: 20 mL/min — ABNORMAL LOW (ref >60)
GLUCOSE: 187 mg/dL — AB (ref 65–99)
POTASSIUM: 4.5 mmol/L (ref 3.5–5.1)
Sodium: 139 mmol/L (ref 135–145)
TOTAL PROTEIN: 7.6 g/dL (ref 6.5–8.1)

## 2017-06-12 LAB — LIPID PANEL
CHOLESTEROL: 526 mg/dL — AB (ref 0–200)
HDL: 42 mg/dL (ref >40)
LDL Cholesterol: UNDETERMINED mg/dL (ref 0–99)
Total CHOL/HDL Ratio: 12.5 RATIO
Triglycerides: 662 mg/dL — ABNORMAL HIGH (ref <150)
VLDL: UNDETERMINED mg/dL (ref 0–40)

## 2017-06-12 LAB — HEMOGLOBIN A1C
Hgb A1c MFr Bld: 7.3 % — ABNORMAL HIGH (ref 4.8–5.6)
Mean Plasma Glucose: 162.81 mg/dL

## 2017-06-12 LAB — T4, FREE: FREE T4: 1.03 ng/dL (ref 0.61–1.12)

## 2017-06-12 LAB — TSH: TSH: 2.602 u[IU]/mL (ref 0.350–4.500)

## 2017-06-13 LAB — MICROALBUMIN / CREATININE URINE RATIO
Creatinine, Urine: 70.9 mg/dL
MICROALB UR: 2170.9 ug/mL — AB
MICROALB/CREAT RATIO: 3061.9 mg/g{creat} — AB (ref 0.0–30.0)

## 2017-06-13 LAB — VITAMIN D 25 HYDROXY (VIT D DEFICIENCY, FRACTURES): Vit D, 25-Hydroxy: 21.9 ng/mL — ABNORMAL LOW (ref 30.0–100.0)

## 2017-06-18 ENCOUNTER — Encounter: Payer: Self-pay | Admitting: "Endocrinology

## 2017-06-18 ENCOUNTER — Ambulatory Visit: Payer: Medicare HMO | Admitting: "Endocrinology

## 2017-06-18 VITALS — BP 161/89 | HR 68 | Ht 64.0 in | Wt 177.0 lb

## 2017-06-18 DIAGNOSIS — E039 Hypothyroidism, unspecified: Secondary | ICD-10-CM | POA: Diagnosis not present

## 2017-06-18 DIAGNOSIS — N184 Chronic kidney disease, stage 4 (severe): Secondary | ICD-10-CM

## 2017-06-18 DIAGNOSIS — E1122 Type 2 diabetes mellitus with diabetic chronic kidney disease: Secondary | ICD-10-CM

## 2017-06-18 DIAGNOSIS — I10 Essential (primary) hypertension: Secondary | ICD-10-CM | POA: Diagnosis not present

## 2017-06-18 DIAGNOSIS — E782 Mixed hyperlipidemia: Secondary | ICD-10-CM

## 2017-06-18 MED ORDER — FENOFIBRATE 54 MG PO TABS: 54.0000 mg | ORAL_TABLET | Freq: Every day | ORAL | 3 refills | Status: DC

## 2017-06-18 MED ORDER — CALCITRIOL 0.25 MCG PO CAPS: 0.2500 ug | ORAL_CAPSULE | Freq: Every day | ORAL | 3 refills | Status: DC

## 2017-06-18 NOTE — Progress Notes (Signed)
Subjective:    Patient ID: [name], female    DOB: 05-22-1941. Patient is being seen in f/u for management of diabetes requested by  [name], MD  Past Medical History:  Diagnosis Date  . Anemia   . Arthritis   . Blood transfusion without reported diagnosis   . Cataract   . Chronic anemia   . Chronic combined systolic and diastolic CHF (congestive heart failure) ([place])    a. 2D echo 08/2016 at [facility]: EF 50-55% with inferior wall HK, impaired LV filling, fair study.  . CKD (chronic kidney disease), stage III ([place])   . Diabetes ([place])   . Gastritis   . GERD (gastroesophageal reflux disease)   . Gout   . HTN (hypertension)   . Hyperlipidemia   . Iron deficiency anemia 11/08/2016  . Normocytic anemia 10/26/2016  . NSTEMI (non-ST elevated myocardial infarction) ([place])    a. Complex admission 08/2016 - with severe hyperglycemia, AKI on CKD, severe anemia down to Hgb 6.8, acute combined CHF, troponin of 8.5, cath deferred due to renal dysfunction.  . Thyroid disease    Past Surgical History:  Procedure Laterality Date  . COLONOSCOPY WITH PROPOFOL N/A 10/08/2016   Procedure: COLONOSCOPY WITH PROPOFOL;  Surgeon: Ronnette Juniper, MD;  Location: [place];  Service: Gastroenterology;  Laterality: N/A;  . ESOPHAGOGASTRODUODENOSCOPY N/A 10/06/2016   Procedure: ESOPHAGOGASTRODUODENOSCOPY (EGD);  Surgeon: Otis Brace, MD;  Location: [facility] ENDOSCOPY;  Service: Gastroenterology;  Laterality: N/A;  . ESOPHAGOGASTRODUODENOSCOPY (EGD) WITH PROPOFOL N/A 03/12/2017   Procedure: ESOPHAGOGASTRODUODENOSCOPY (EGD) WITH PROPOFOL;  Surgeon: Danie Binder, MD;  [name];  Service: Endoscopy;  Laterality: N/A;  . GIVENS CAPSULE STUDY  10/08/2016   Procedure: GIVENS CAPSULE STUDY;  Surgeon: Ronnette Juniper, MD;  Location: [place];  Service: Gastroenterology;;  . Freda Munro CAPSULE STUDY N/A 10/29/2016   Procedure: GIVENS CAPSULE STUDY;  Surgeon: Danie Binder, MD;  [facility];  Service: Endoscopy;  Laterality: N/A;  . ORIF HUMERUS FRACTURE Left 02/07/2015   Procedure: OPEN REDUCTION INTERNAL FIXATION (ORIF) PROXIMAL HUMERUS FRACTURE;  Surgeon: Marybelle Killings, MD;  Location: [place];  Service: Orthopedics;  Laterality: Left;  . THYROID SURGERY    . TOTAL HIP ARTHROPLASTY Left 02/07/2015   Procedure: TOTAL HIP ARTHROPLASTY ANTERIOR APPROACH ;  Surgeon: Marybelle Killings, MD;  Location: [place];  Service: Orthopedics;  Laterality: Left;  . WRIST SURGERY Left    Social History   Socioeconomic History  . Marital status: Widowed    Spouse name: [name]  . Number of children: 2  . Years of education: 4  . Highest education level: None  Social Needs  . Financial resource strain: None  . Food insecurity - worry: None  . Food insecurity - inability: None  . Transportation needs - medical: None  . Transportation needs - non-medical: None  Occupational History  . Occupation: retired  Tobacco Use  . Smoking status: Never Smoker  . Smokeless tobacco: Never Used  Substance and Sexual Activity  . Alcohol use: No    Alcohol/week: 0.0 oz  . Drug use: No  . Sexual activity: Not Currently  Other Topics Concern  . None  Social History Narrative   Lives with significant other/partner [name]   He is her caregiver   Moved from [facility]   Outpatient Encounter Medications as of 06/18/2017  Medication Sig  . amLODipine (NORVASC) 10 MG tablet Take 1 tablet (10 mg total) by mouth daily.  Marland Kitchen  febuxostat (ULORIC) 40 MG tablet Take 1 tablet (40 mg total) by mouth daily.  . fenofibrate 54 MG tablet Take 1 tablet (54 mg total) by mouth daily.  . furosemide (LASIX) 20 MG tablet Take 20 mg by mouth as needed. Only has to take it when she gains 2 lbs  . gabapentin (NEURONTIN) 300 MG capsule Take 2 capsules (600 mg total) by mouth at bedtime.  . hydrALAZINE (APRESOLINE) 25 MG tablet TAKE 1 TABLET BY MOUTH EVERY 8 HOURS AS NEEDED  . Insulin Glargine (TOUJEO MAX SOLOSTAR) 300 UNIT/ML SOPN Inject 20  Units at bedtime into the skin.  Marland Kitchen levothyroxine (SYNTHROID, LEVOTHROID) 50 MCG tablet Take 1 tablet (50 mcg total) by mouth daily before breakfast.  . metoprolol tartrate (LOPRESSOR) 25 MG tablet Take 12.5 mg by mouth 2 (two) times daily.  . Multiple Vitamin (MULTIVITAMIN WITH MINERALS) TABS tablet Take 1 tablet by mouth daily.  . ondansetron (ZOFRAN) 4 MG tablet Take 1 tablet (4 mg total) by mouth every 8 (eight) hours as needed for nausea or vomiting.  . pantoprazole (PROTONIX) 40 MG tablet Take 1 tablet (40 mg total) by mouth daily. 30 minutes before breakfast  . [DISCONTINUED] Insulin Pen Needle (B-D ULTRAFINE III SHORT PEN) 31G X 8 MM MISC 1 each by Does not apply route as directed.  . [DISCONTINUED] Insulin Pen Needle (PEN NEEDLES) 32G X 6 MM MISC 1 each by Does not apply route at bedtime.   No facility-administered encounter medications on file as of 06/18/2017.    ALLERGIES: Allergies  Allergen Reactions  . Insulin Glargine Swelling    "Makes me swell like a balloon all over", including face, but without any respiratory distress or rashes. Associated with weight gain.  . Statins Other (See Comments)    "I've tried them all; my muscle aches were so bad I couldn't walk".   VACCINATION STATUS: Immunization History  Administered Date(s) Administered  . Influenza,inj,Quad PF,6+ Mos 03/15/2017  . Pneumococcal Polysaccharide-23 09/20/2016    Diabetes  She presents for her follow-up diabetic visit. She has type 2 diabetes mellitus. Onset time: She was diagnosed at approximate age of 47 years. Her disease course has been stable (She comes in prior to her scheduled appointment due to recent hospitalization with GI bleed when she was observed to have above target blood glucose readings. Her blood glucose readings have largely stabilized since her discharge from hospital.). Hypoglycemia symptoms include confusion, dizziness, mood changes and sweats. Pertinent negatives for hypoglycemia include  no headaches, pallor or seizures. There are no diabetic associated symptoms. Pertinent negatives for diabetes include no chest pain, no polydipsia, no polyphagia and no polyuria. There are no hypoglycemic complications. Symptoms are stable. Diabetic complications include nephropathy. Risk factors for coronary artery disease include diabetes mellitus, hypertension and post-menopausal. Current diabetic treatment includes oral agent (monotherapy) (She is taking glipizide 10 mg by mouth twice a day, her recent A1c is 5.3%. She reports frequent hypoglycemia into the 50s.). Her weight is increasing steadily. She is following a generally unhealthy diet. When asked about meal planning, she reported none. She has not had a previous visit with a dietitian. She never participates in exercise. Her home blood glucose trend is increasing steadily. Her breakfast blood glucose range is generally 140-180 mg/dl. An ACE inhibitor/angiotensin II receptor blocker is being taken. Eye exam is current.  Hypertension  This is a chronic problem. The current episode started more than 1 year ago. Associated symptoms include sweats. Pertinent negatives include no chest pain,  headaches, palpitations or shortness of breath. Past treatments include calcium channel blockers and direct vasodilators. Hypertensive end-organ damage includes kidney disease.     Review of Systems  Constitutional: Negative for chills, fever and unexpected weight change.  HENT: Negative for trouble swallowing and voice change.   Eyes: Negative for visual disturbance.  Respiratory: Negative for cough, shortness of breath and wheezing.   Cardiovascular: Negative for chest pain, palpitations and leg swelling.  Gastrointestinal: Negative for diarrhea, nausea and vomiting.  Endocrine: Negative for cold intolerance, heat intolerance, polydipsia, polyphagia and polyuria.  Musculoskeletal: Negative for arthralgias and myalgias.  Skin: Negative for color change,  pallor, rash and wound.  Neurological: Positive for dizziness. Negative for seizures and headaches.  Psychiatric/Behavioral: Positive for confusion. Negative for suicidal ideas.    Objective:    BP (!) 161/89   Pulse 68   Ht 5\' 4"  (1.626 m)   Wt 177 lb (80.3 kg)   BMI 30.38 kg/m   Wt Readings from Last 3 Encounters:  06/18/17 177 lb (80.3 kg)  05/22/17 177 lb 11.2 oz (80.6 kg)  05/20/17 181 lb (82.1 kg)    Physical Exam  Constitutional: She is oriented to person, place, and time. She appears well-developed.  HENT:  Head: Normocephalic and atraumatic.  Eyes: EOM are normal.  Neck: Normal range of motion. Neck supple. No tracheal deviation present. No thyromegaly present.  Cardiovascular: Normal rate and regular rhythm.  Pulmonary/Chest: Effort normal and breath sounds normal.  Abdominal: Soft. Bowel sounds are normal. There is no tenderness. There is no guarding.  Musculoskeletal: Normal range of motion. She exhibits no edema.  Neurological: She is alert and oriented to person, place, and time. She has normal reflexes. No cranial nerve deficit. Coordination normal.  Skin: Skin is warm and dry. No rash noted. No erythema. No pallor.  Psychiatric: She has a normal mood and affect. Judgment normal.     CMP     Component Value Date/Time   NA 139 06/12/2017 1050   K 4.5 06/12/2017 1050   CL 103 06/12/2017 1050   CO2 24 06/12/2017 1050   GLUCOSE 187 (H) 06/12/2017 1050   BUN 53 (H) 06/12/2017 1050   CREATININE 2.21 (H) 06/12/2017 1050   CREATININE 2.58 (H) 10/25/2016 1026   CALCIUM 9.4 06/12/2017 1050   PROT 7.6 06/12/2017 1050   ALBUMIN 3.8 06/12/2017 1050   AST 18 06/12/2017 1050   ALT 14 06/12/2017 1050   ALKPHOS 79 06/12/2017 1050   BILITOT 0.6 06/12/2017 1050   GFRNONAA 20 (L) 06/12/2017 1050   GFRNONAA 17 (L) 10/25/2016 1026   GFRAA 24 (L) 06/12/2017 1050   GFRAA 20 (L) 10/25/2016 1026     Diabetic Labs (most recent): Lab Results  Component Value Date    HGBA1C 7.3 (H) 06/12/2017   HGBA1C 6.8 (H) 02/04/2017   HGBA1C 5.3 10/25/2016    Assessment & Plan:   1. Diabetes mellitus with stage 4 chronic kidney disease ([place])  - Patient has currently uncontrolled symptomatic type 2 DM since 77 years of age. - She came with fast tingling blood glucose near target, still above target postprandial glycemic profile. Her A1c 7.3%, increasing from 6.8%.  Recent labs reviewed, showing stage 4 CKD.   - her diabetes is complicated by CKD and patient remains at a high risk for more acute and chronic complications which include CAD, CVA, ERSD, retinopathy, and neuropathy. These are all discussed in detail with the patient.  - I have counseled the patient  on diet management and weight loss, by adopting a carbohydrate restricted/protein rich diet.  -  Suggestion is made for her to avoid simple carbohydrates  from her diet including Cakes, Sweet Desserts / Pastries, Ice Cream, Soda (diet and regular), Sweet Tea, Candies, Chips, Cookies, Store Bought Juices, Alcohol in Excess of  1-2 drinks a day, Artificial Sweeteners, and "Sugar-free" Products. This will help patient to have stable blood glucose profile and potentially avoid unintended weight gain.   - I encouraged the patient to switch to  unprocessed or minimally processed complex starch and increased protein intake (animal or plant source), fruits, and vegetables.  - Patient is advised to stick to a routine mealtimes to eat 3 meals  a day and avoid unnecessary snacks ( to snack only to correct hypoglycemia).   - I have approached patient with the following individualized plan to manage diabetes and patient agrees:   - Main goal in her therapy still  would be to avoid hypoglycemia. - Her presentation today is better and several ways including absence of hypoglycemia . -  She would not need additional treatment for diabetes. I advised her to increase her basal insulin Toujeo to 24 units daily at bedtime  associated with monitoring of blood glucose 2 times daily-before breakfast and bedtime.    -Patient is encouraged to call clinic for blood glucose levels less than 70 or above 200 mg /dl.  -Patient is not a candidate for metformin, SGLT2 inhibitors  due to CKD.  - Patient specific target  A1c;  LDL, HDL, Triglycerides, and  Waist Circumference were discussed in detail.  2) BP/HTN: Uncontrolled. I advised her to continue current medications , not on ACEI/ARB- will be considered next visit if not initiated by nephrology.  3) Lipids/HPL:   She has a reported history of statin allergy, her recent triglycerides are elevated at 662 increasing from 476. - I discussed and initiated fenofibrate 54 mg by mouth daily to advance if tolerated.   4)  Weight/Diet: CDE Consult has been initiated , exercise, and detailed carbohydrates information provided.  5) hypothyroidism: -Her recent thyroid function tests are consistent with appropriate replacement.  I  advised her to continue levothyroxine 125 g by mouth every morning.  - We discussed about correct intake of levothyroxine, at fasting, with water, separated by at least 30 minutes from breakfast, and separated by more than 4 hours from calcium, iron, multivitamins, acid reflux medications (PPIs). -Patient is made aware of the fact that thyroid hormone replacement is needed for life, dose to be adjusted by periodic monitoring of thyroid function tests.  6) Chronic Care/Health Maintenance:  -Patient is not  on ACEI/ARB and Statin medications and encouraged to continue to follow up with Ophthalmology, Podiatrist at least yearly or according to recommendations, and advised to  stay away from smoking. I have recommended yearly flu vaccine and pneumonia vaccination at least every 5 years; moderate intensity exercise for up to 150 minutes weekly; and  sleep for at least 7 hours a day.  - I advised patient to maintain close follow up with Med[facility] Coffee Jennette Banker,  MD for primary care needs.  - Time spent with the patient: 25 min, of which >50% was spent in reviewing her blood glucose logs , discussing her hypo- and hyper-glycemic episodes, reviewing her current and  previous labs and insulin doses and developing a plan to avoid hypo- and hyper-glycemia. Please refer to Patient Instructions for Blood Glucose Monitoring and Insulin/Medications Dosing Guide"  in media tab  for additional information.  Follow up plan: - Return in about 4 months (around 10/16/2017) for follow up with pre-visit labs, meter, and logs.  Glade [name], MD Phone: [phone]  Fax: [phone]   -  This note was partially dictated with voice recognition software. Similar sounding words can be transcribed inadequately or may not  be corrected upon review.  06/18/2017, 11:37 AM

## 2017-06-18 NOTE — Patient Instructions (Signed)

## 2017-06-19 ENCOUNTER — Ambulatory Visit: Payer: Medicare HMO | Admitting: Gastroenterology

## 2017-06-19 ENCOUNTER — Encounter: Payer: Self-pay | Admitting: Gastroenterology

## 2017-06-19 DIAGNOSIS — D500 Iron deficiency anemia secondary to blood loss (chronic): Secondary | ICD-10-CM

## 2017-06-19 NOTE — Assessment & Plan Note (Signed)
WILE TAKING ASPIRIN AND PLAVIX. DEC 2018 Hb NL OFF ASA/PLAVIX. NO BRBPR OR MELENA.  REVIEWED LABS FROM DEC 2018. DRINK WATER TO KEEP YOUR URINE LIGHT YELLOW. FOLLOW A HIGH FIBER DIET. AVOID ITEMS THAT CAUSE BLOATING & GAS.  HANDOUT GIVEN. PLEASE CALL WITH QUESTIONS OR CONCERNS. FOLLOW UP IN 6 MOS.

## 2017-06-19 NOTE — Progress Notes (Signed)
ON RECALL  °

## 2017-06-19 NOTE — Progress Notes (Signed)
CC'ED TO PCP 

## 2017-06-19 NOTE — Progress Notes (Signed)
Subjective:    Patient ID: [name], female    DOB: Apr 06, 1941, 77 y.o.   MRN: [id number]  [name], MD   HPI No questions or concerns. ONLY HAD TO TAKE TWO PILLS FOR NAUSEA. BLOOD COUNT IS NORMAL. SEE HEMATOLOGY TOMORROW. BMs: GOOD. APPETITE: [employer].  PT DENIES FEVER, CHILLS, HEMATOCHEZIA, HEMATEMESIS, nausea, vomiting, melena, diarrhea, CHEST PAIN, SHORTNESS OF BREATH,  CHANGE IN BOWEL IN HABITS, abdominal pain, problems swallowing, problems with sedation, OR heartburn or indigestion.  Past Medical History:  Diagnosis Date  . Anemia   . Arthritis   . Blood transfusion without reported diagnosis   . Cataract   . Chronic anemia   . Chronic combined systolic and diastolic CHF (congestive heart failure) ([place])    a. 2D echo 08/2016 at [facility]: EF 50-55% with inferior wall HK, impaired LV filling, fair study.  . CKD (chronic kidney disease), stage III ([place])   . Diabetes ([place])   . Gastritis   . GERD (gastroesophageal reflux disease)   . Gout   . HTN (hypertension)   . Hyperlipidemia   . Iron deficiency anemia 11/08/2016  . Normocytic anemia 10/26/2016  . NSTEMI (non-ST elevated myocardial infarction) ([place])    a. Complex admission 08/2016 - with severe hyperglycemia, AKI on CKD, severe anemia down to Hgb 6.8, acute combined CHF, troponin of 8.5, cath deferred due to renal dysfunction.  . Thyroid disease    Past Surgical History:  Procedure Laterality Date  . COLONOSCOPY WITH PROPOFOL N/A 10/08/2016   Procedure: COLONOSCOPY WITH PROPOFOL;  Surgeon: Ronnette Juniper, MD;  Location: [place];  Service: Gastroenterology;  Laterality: N/A;  . ESOPHAGOGASTRODUODENOSCOPY N/A 10/06/2016   Procedure: ESOPHAGOGASTRODUODENOSCOPY (EGD);  Surgeon: Otis Brace, MD;  Location: [facility] ENDOSCOPY;  Service: Gastroenterology;  Laterality: N/A;  . ESOPHAGOGASTRODUODENOSCOPY (EGD) WITH PROPOFOL N/A 03/12/2017   Procedure: ESOPHAGOGASTRODUODENOSCOPY (EGD) WITH PROPOFOL;   Surgeon: Danie Binder, MD;  [name];  Service: Endoscopy;  Laterality: N/A;  . GIVENS CAPSULE STUDY  10/08/2016   Procedure: GIVENS CAPSULE STUDY;  Surgeon: Ronnette Juniper, MD;  Location: [place];  Service: Gastroenterology;;  . Freda Munro CAPSULE STUDY N/A 10/29/2016   Procedure: GIVENS CAPSULE STUDY;  Surgeon: Danie Binder, MD;  [name];  Service: Endoscopy;  Laterality: N/A;  . ORIF HUMERUS FRACTURE Left 02/07/2015   Procedure: OPEN REDUCTION INTERNAL FIXATION (ORIF) PROXIMAL HUMERUS FRACTURE;  Surgeon: Marybelle Killings, MD;  Location: [place];  Service: Orthopedics;  Laterality: Left;  . THYROID SURGERY    . TOTAL HIP ARTHROPLASTY Left 02/07/2015   Procedure: TOTAL HIP ARTHROPLASTY ANTERIOR APPROACH ;  Surgeon: Marybelle Killings, MD;  Location: [place];  Service: Orthopedics;  Laterality: Left;  . WRIST SURGERY Left    Allergies  Allergen Reactions  . Insulin Glargine Swelling    "Makes me swell like a balloon all over", including face, but without any respiratory distress or rashes. Associated with weight gain.  . Statins Other (See Comments)    "I've tried them all; my muscle aches were so bad I couldn't walk".   Current Outpatient Medications  Medication Sig Dispense Refill  . amLODipine (NORVASC) 10 MG tablet Take 1 tablet (10 mg total) by mouth daily.    . febuxostat (ULORIC) 40 MG tablet Take 1 tablet (40 mg total) by mouth daily.    . fenofibrate 54 MG tablet Take 1 tablet (54 mg total) by mouth daily.    . furosemide (LASIX) 20  MG tablet Take 20 mg by mouth as needed. Only has to take it when she gains 2 lbs    . gabapentin (NEURONTIN) 300 MG capsule Take 2 capsules (600 mg total) by mouth at bedtime.    . hydrALAZINE (APRESOLINE) 25 MG tablet TAKE 1 TABLET BY MOUTH EVERY 8 HOURS AS NEEDED    . Insulin Glargine (TOUJEO MAX SOLOSTAR) 300 UNIT/ML SOPN Inject 20 Units at bedtime into the skin. (Patient taking differently: Inject 24 Units into the skin at bedtime.  )    . levothyroxine (SYNTHROID, LEVOTHROID) 50 MCG tablet Take 1 tablet (50 mcg total) by mouth daily before breakfast.    . metoprolol tartrate (LOPRESSOR) 25 MG tablet Take 12.5 mg by mouth 2 (two) times daily.    . Multiple Vitamin (MULTIVITAMIN WITH MINERALS) TABS tablet Take 1 tablet by mouth daily.    . ondansetron (ZOFRAN) 4 MG tablet Take 1 tablet (4 mg total) by mouth every 8 (eight) hours as needed for nausea or vomiting.    . pantoprazole (PROTONIX) 40 MG tablet Take 1 tablet (40 mg total) by mouth daily. 30 minutes before breakfast    .       Review of Systems PER HPI OTHERWISE ALL SYSTEMS ARE NEGATIVE.    Objective:   Physical Exam  Constitutional: She is oriented to person, place, and time. She appears well-developed and well-nourished. No distress.  HENT:  Head: Normocephalic and atraumatic.  Mouth/Throat: Oropharynx is clear and moist. No oropharyngeal exudate.  Eyes: Pupils are equal, round, and reactive to light. No scleral icterus.  Neck: Normal range of motion. Neck supple.  Cardiovascular: Normal rate, regular rhythm and normal heart sounds.  Pulmonary/Chest: Effort normal and breath sounds normal. No respiratory distress.  Abdominal: Soft. Bowel sounds are normal. She exhibits no distension. There is no tenderness.  Musculoskeletal: She exhibits no edema.  Lymphadenopathy:    She has no cervical adenopathy.  Neurological: She is alert and oriented to person, place, and time.  NO FOCAL DEFICITS  Psychiatric: She has a normal mood and affect.  Vitals reviewed.     Assessment & Plan:

## 2017-06-19 NOTE — Patient Instructions (Addendum)
DRINK WATER TO KEEP YOUR URINE LIGHT YELLOW.  FOLLOW A HIGH FIBER DIET. AVOID ITEMS THAT CAUSE BLOATING & GAS. SEE INFO BELOW.  PLEASE CALL WITH QUESTIONS OR CONCERNS.  FOLLOW UP IN 6 MOS.   High-Fiber Diet A high-fiber diet changes your normal diet to include more whole grains, legumes, fruits, and vegetables. Changes in the diet involve replacing refined carbohydrates with unrefined foods. The calorie level of the diet is essentially unchanged. The Dietary Reference Intake (recommended amount) for adult males is 38 grams per day. For adult females, it is 25 grams per day. Pregnant and lactating women should consume 28 grams of fiber per day.Fiber is the intact part of a plant that is not broken down during digestion. Functional fiber is fiber that has been isolated from the plant to provide a beneficial effect in the body.  PURPOSE  Increase stool bulk.   Ease and regulate bowel movements.   Lower cholesterol.   REDUCE RISK OF COLON CANCER  INDICATIONS THAT YOU NEED MORE FIBER  Constipation and hemorrhoids.   Uncomplicated diverticulosis (intestine condition) and irritable bowel syndrome.   Weight management.   As a protective measure against hardening of the arteries (atherosclerosis), diabetes, and cancer.   GUIDELINES FOR INCREASING FIBER IN THE DIET  Start adding fiber to the diet slowly. A gradual increase of about 5 more grams (2 slices of whole-wheat bread, 2 servings of most fruits or vegetables, or 1 bowl of high-fiber cereal) per day is best. Too rapid an increase in fiber may result in constipation, flatulence, and bloating.   Drink enough water and fluids to keep your urine clear or pale yellow. Water, juice, or caffeine-free drinks are recommended. Not drinking enough fluid may cause constipation.   Eat a variety of high-fiber foods rather than one type of fiber.   Try to increase your intake of fiber through using high-fiber foods rather than fiber pills or  supplements that contain small amounts of fiber.   The goal is to change the types of food eaten. Do not supplement your present diet with high-fiber foods, but replace foods in your present diet.   INCLUDE A VARIETY OF FIBER SOURCES  Replace refined and processed grains with whole grains, canned fruits with fresh fruits, and incorporate other fiber sources. White rice, white breads, and most bakery goods contain little or no fiber.   Brown whole-grain rice, buckwheat oats, and many fruits and vegetables are all good sources of fiber. These include: broccoli, Brussels sprouts, cabbage, cauliflower, beets, sweet potatoes, white potatoes (skin on), carrots, tomatoes, eggplant, squash, berries, fresh fruits, and dried fruits.   Cereals appear to be the richest source of fiber. Cereal fiber is found in whole grains and bran. Bran is the fiber-rich outer coat of cereal grain, which is largely removed in refining. In whole-grain cereals, the bran remains. In breakfast cereals, the largest amount of fiber is found in those with "bran" in their names. The fiber content is sometimes indicated on the label.   You may need to include additional fruits and vegetables each day.   In baking, for 1 cup white flour, you may use the following substitutions:   1 cup whole-wheat flour minus 2 tablespoons.   1/2 cup white flour plus 1/2 cup whole-wheat flour.

## 2017-06-20 ENCOUNTER — Inpatient Hospital Stay (HOSPITAL_COMMUNITY): Payer: Medicare HMO | Attending: Oncology

## 2017-06-20 DIAGNOSIS — I130 Hypertensive heart and chronic kidney disease with heart failure and stage 1 through stage 4 chronic kidney disease, or unspecified chronic kidney disease: Secondary | ICD-10-CM | POA: Diagnosis not present

## 2017-06-20 DIAGNOSIS — I252 Old myocardial infarction: Secondary | ICD-10-CM | POA: Diagnosis not present

## 2017-06-20 DIAGNOSIS — E1122 Type 2 diabetes mellitus with diabetic chronic kidney disease: Secondary | ICD-10-CM | POA: Insufficient documentation

## 2017-06-20 DIAGNOSIS — D631 Anemia in chronic kidney disease: Secondary | ICD-10-CM | POA: Diagnosis not present

## 2017-06-20 DIAGNOSIS — E785 Hyperlipidemia, unspecified: Secondary | ICD-10-CM | POA: Insufficient documentation

## 2017-06-20 DIAGNOSIS — D509 Iron deficiency anemia, unspecified: Secondary | ICD-10-CM | POA: Diagnosis not present

## 2017-06-20 DIAGNOSIS — Z794 Long term (current) use of insulin: Secondary | ICD-10-CM | POA: Diagnosis not present

## 2017-06-20 DIAGNOSIS — Z79899 Other long term (current) drug therapy: Secondary | ICD-10-CM | POA: Insufficient documentation

## 2017-06-20 DIAGNOSIS — N184 Chronic kidney disease, stage 4 (severe): Secondary | ICD-10-CM | POA: Insufficient documentation

## 2017-06-20 DIAGNOSIS — D500 Iron deficiency anemia secondary to blood loss (chronic): Secondary | ICD-10-CM

## 2017-06-20 LAB — COMPREHENSIVE METABOLIC PANEL
ALK PHOS: 82 U/L (ref 38–126)
ALT: 15 U/L (ref 14–54)
AST: 20 U/L (ref 15–41)
Albumin: 3.9 g/dL (ref 3.5–5.0)
Anion gap: 12 (ref 5–15)
BILIRUBIN TOTAL: 0.7 mg/dL (ref 0.3–1.2)
BUN: 44 mg/dL — ABNORMAL HIGH (ref 6–20)
CALCIUM: 9.1 mg/dL (ref 8.9–10.3)
CO2: 22 mmol/L (ref 22–32)
CREATININE: 2.15 mg/dL — AB (ref 0.44–1.00)
Chloride: 103 mmol/L (ref 101–111)
GFR calc Af Amer: 24 mL/min — ABNORMAL LOW (ref >60)
GFR, EST NON AFRICAN AMERICAN: 21 mL/min — AB (ref >60)
Glucose, Bld: 135 mg/dL — ABNORMAL HIGH (ref 65–99)
POTASSIUM: 5 mmol/L (ref 3.5–5.1)
Sodium: 137 mmol/L (ref 135–145)
TOTAL PROTEIN: 7.7 g/dL (ref 6.5–8.1)

## 2017-06-20 LAB — IRON AND TIBC
Iron: 70 ug/dL (ref 28–170)
SATURATION RATIOS: 26 % (ref 10.4–31.8)
TIBC: 272 ug/dL (ref 250–450)
UIBC: 202 ug/dL

## 2017-06-20 LAB — CBC WITH DIFFERENTIAL/PLATELET
BASOS ABS: 0.1 10*3/uL (ref 0.0–0.1)
Basophils Relative: 1 %
Eosinophils Absolute: 0.4 10*3/uL (ref 0.0–0.7)
Eosinophils Relative: 7 %
HEMATOCRIT: 40.9 % (ref 36.0–46.0)
Hemoglobin: 13.4 g/dL (ref 12.0–15.0)
LYMPHS PCT: 19 %
Lymphs Abs: 1.2 10*3/uL (ref 0.7–4.0)
MCH: 29.3 pg (ref 26.0–34.0)
MCHC: 32.8 g/dL (ref 30.0–36.0)
MCV: 89.3 fL (ref 78.0–100.0)
MONO ABS: 0.4 10*3/uL (ref 0.1–1.0)
MONOS PCT: 6 %
NEUTROS ABS: 4.4 10*3/uL (ref 1.7–7.7)
Neutrophils Relative %: 67 %
Platelets: 286 10*3/uL (ref 150–400)
RBC: 4.58 MIL/uL (ref 3.87–5.11)
RDW: 13.7 % (ref 11.5–15.5)
WBC: 6.5 10*3/uL (ref 4.0–10.5)

## 2017-06-20 LAB — FERRITIN: FERRITIN: 372 ng/mL — AB (ref 11–307)

## 2017-06-21 ENCOUNTER — Other Ambulatory Visit (HOSPITAL_COMMUNITY): Payer: Medicare HMO

## 2017-06-22 ENCOUNTER — Other Ambulatory Visit: Payer: Self-pay | Admitting: "Endocrinology

## 2017-06-24 ENCOUNTER — Other Ambulatory Visit: Payer: Self-pay

## 2017-06-24 ENCOUNTER — Encounter (HOSPITAL_COMMUNITY): Payer: Self-pay | Admitting: Emergency Medicine

## 2017-06-24 ENCOUNTER — Emergency Department (HOSPITAL_COMMUNITY)
Admission: EM | Admit: 2017-06-24 | Discharge: 2017-06-24 | Disposition: A | Discharge status: Home / Self Care | Payer: Medicare HMO | Attending: Emergency Medicine | Admitting: Emergency Medicine

## 2017-06-24 ENCOUNTER — Emergency Department (HOSPITAL_COMMUNITY): Payer: Medicare HMO

## 2017-06-24 DIAGNOSIS — M25552 Pain in left hip: Secondary | ICD-10-CM | POA: Diagnosis not present

## 2017-06-24 DIAGNOSIS — M5432 Sciatica, left side: Secondary | ICD-10-CM | POA: Insufficient documentation

## 2017-06-24 DIAGNOSIS — I259 Chronic ischemic heart disease, unspecified: Secondary | ICD-10-CM | POA: Insufficient documentation

## 2017-06-24 DIAGNOSIS — M25562 Pain in left knee: Secondary | ICD-10-CM | POA: Diagnosis not present

## 2017-06-24 DIAGNOSIS — N184 Chronic kidney disease, stage 4 (severe): Secondary | ICD-10-CM | POA: Insufficient documentation

## 2017-06-24 DIAGNOSIS — E1122 Type 2 diabetes mellitus with diabetic chronic kidney disease: Secondary | ICD-10-CM | POA: Insufficient documentation

## 2017-06-24 DIAGNOSIS — I130 Hypertensive heart and chronic kidney disease with heart failure and stage 1 through stage 4 chronic kidney disease, or unspecified chronic kidney disease: Secondary | ICD-10-CM | POA: Insufficient documentation

## 2017-06-24 DIAGNOSIS — E039 Hypothyroidism, unspecified: Secondary | ICD-10-CM | POA: Insufficient documentation

## 2017-06-24 DIAGNOSIS — I5042 Chronic combined systolic (congestive) and diastolic (congestive) heart failure: Secondary | ICD-10-CM | POA: Insufficient documentation

## 2017-06-24 DIAGNOSIS — Z96642 Presence of left artificial hip joint: Secondary | ICD-10-CM | POA: Diagnosis not present

## 2017-06-24 DIAGNOSIS — Z471 Aftercare following joint replacement surgery: Secondary | ICD-10-CM | POA: Diagnosis not present

## 2017-06-24 MED ORDER — TRAMADOL HCL 50 MG PO TABS
50.0000 mg | ORAL_TABLET | Freq: Once | ORAL | Status: AC
  Administered 2017-06-24: 50 mg via ORAL
  Filled 2017-06-24: qty 1

## 2017-06-24 MED ORDER — DOCUSATE SODIUM 100 MG PO CAPS: 100.0000 mg | ORAL_CAPSULE | Freq: Two times a day (BID) | ORAL | 0 refills | Status: DC

## 2017-06-24 MED ORDER — OXYCODONE-ACETAMINOPHEN 5-325 MG PO TABS: 1.0000 | ORAL_TABLET | Freq: Three times a day (TID) | ORAL | 0 refills | Status: DC | PRN

## 2017-06-24 NOTE — Discharge Instructions (Signed)

## 2017-06-24 NOTE — ED Triage Notes (Signed)
Pt states had hip surgery 2016. Pt states having pain to left groin with pain going to knee. Worse pain in knee.denies injury or fall.

## 2017-06-24 NOTE — ED Provider Notes (Signed)
Emergency Department Provider Note   I have reviewed the triage vital signs and the nursing notes.   HISTORY  Chief Complaint Knee Pain   HPI [name] is a 77 y.o. female with PMH of CKD, DM, HTN, HLD, CAD, CHF, and thyroid disease resents to the emergency department for evaluation of left buttocks/hip pain radiating to the knee.  Symptoms have been worsening over the past 4-5 days.  She describes pain starting just above her left buttocks radiating to the groin.  She feels pain in the knee as well.  No injury to the knee or hip.  She has had surgery on that side in 2016.  She states this feels similar to her prior sciatica flares but she has not had one in many years.  No dysuria, hesitancy, urgency.  Denies any hematuria.  No flank pain.  No midline lower back pain.  No associated fevers or chills.   Past Medical History:  Diagnosis Date  . Anemia   . Arthritis   . Blood transfusion without reported diagnosis   . Cataract   . Chronic anemia   . Chronic combined systolic and diastolic CHF (congestive heart failure) ([place])    a. 2D echo 08/2016 at [facility]: EF 50-55% with inferior wall HK, impaired LV filling, fair study.  . CKD (chronic kidney disease), stage III ([place])   . Diabetes ([place])   . Gastritis   . GERD (gastroesophageal reflux disease)   . Gout   . HTN (hypertension)   . Hyperlipidemia   . Iron deficiency anemia 11/08/2016  . Normocytic anemia 10/26/2016  . NSTEMI (non-ST elevated myocardial infarction) ([place])    a. Complex admission 08/2016 - with severe hyperglycemia, AKI on CKD, severe anemia down to Hgb 6.8, acute combined CHF, troponin of 8.5, cath deferred due to renal dysfunction.  . Thyroid disease     Patient Active Problem List   Diagnosis Date Noted  . Essential hypertension, benign 12/13/2016  . Hypothyroidism 12/13/2016  . Iron deficiency anemia 11/08/2016  . Aortic atherosclerosis ([place]) 10/25/2016  . History of non-ST elevation myocardial infarction  (NSTEMI) 10/04/2016  . Neuropathy 09/20/2016  . Chronic kidney disease, stage IV (severe) ([place]) 08/03/2015  . Hip fracture ([place]) 02/05/2015  . Diabetes mellitus with stage 4 chronic kidney disease ([place]) 02/05/2015  . HTN (hypertension) 02/05/2015  . Gout 02/05/2015  . Left humeral fracture 02/05/2015  . Hyperlipidemia associated with type 2 diabetes mellitus ([place]) 05/01/2013    Past Surgical History:  Procedure Laterality Date  . COLONOSCOPY WITH PROPOFOL N/A 10/08/2016   Procedure: COLONOSCOPY WITH PROPOFOL;  Surgeon: Ronnette Juniper, MD;  Location: [place];  Service: Gastroenterology;  Laterality: N/A;  . ESOPHAGOGASTRODUODENOSCOPY N/A 10/06/2016   Procedure: ESOPHAGOGASTRODUODENOSCOPY (EGD);  Surgeon: Otis Brace, MD;  Location: [facility] ENDOSCOPY;  Service: Gastroenterology;  Laterality: N/A;  . ESOPHAGOGASTRODUODENOSCOPY (EGD) WITH PROPOFOL N/A 03/12/2017   Procedure: ESOPHAGOGASTRODUODENOSCOPY (EGD) WITH PROPOFOL;  Surgeon: Danie Binder, MD;  Location[name];  Service: Endoscopy;  Laterality: N/A;  . GIVENS CAPSULE STUDY  10/08/2016   Procedure: GIVENS CAPSULE STUDY;  Surgeon: Ronnette Juniper, MD;  Location: [place];  Service: Gastroenterology;;  . Freda Munro CAPSULE STUDY N/A 10/29/2016   Procedure: GIVENS CAPSULE STUDY;  Surgeon: Danie Binder, MD;  Location: [name];  Service: Endoscopy;  Laterality: N/A;  . ORIF HUMERUS FRACTURE Left 02/07/2015   Procedure: OPEN REDUCTION INTERNAL FIXATION (ORIF) PROXIMAL HUMERUS FRACTURE;  Surgeon: Marybelle Killings, MD;  Location: [facility]  OR;  Service: Orthopedics;  Laterality: Left;  . THYROID SURGERY    . TOTAL HIP ARTHROPLASTY Left 02/07/2015   Procedure: TOTAL HIP ARTHROPLASTY ANTERIOR APPROACH ;  Surgeon: Marybelle Killings, MD;  Location: [place];  Service: Orthopedics;  Laterality: Left;  . WRIST SURGERY Left     Current Outpatient Rx  . Order #: [id number] Class: Normal  . Order #: [id number] Class: Normal  . Order #: [id number] Class: Print    . Order #: [id number] Class: Normal  . Order #: [id number] Class: Normal  . Order #: [id number] Class: Historical Med  . Order #: [id number] Class: Normal  . Order #: [id number] Class: Normal  . Order #: [id number] Class: Normal  . Order #: [id number] Class: Normal  . Order #: [id number] Class: Historical Med  . Order #: [id number] Class: Historical Med  . Order #: [id number] Class: Normal  . Order #: [id number] Class: Print  . Order #: [id number] Class: Normal    Allergies Insulin glargine and Statins  Family History  Problem Relation Age of Onset  . Diabetes Mother   . Asthma Mother   . Early death Mother 65       Pneumonia  . Early death Father        Killed at [place]  . CAD Neg Hx   . GI Bleed Neg Hx     Social History Social History   Tobacco Use  . Smoking status: Never Smoker  . Smokeless tobacco: Never Used  Substance Use Topics  . Alcohol use: No    Alcohol/week: 0.0 oz  . Drug use: No    Review of Systems  Constitutional: No fever/chills Eyes: No visual changes. ENT: No sore throat. Cardiovascular: Denies chest pain. Respiratory: Denies shortness of breath. Gastrointestinal: No abdominal pain. No nausea, no vomiting.  No diarrhea.  No constipation. Genitourinary: Negative for dysuria. Musculoskeletal: Negative for back pain. Positive left buttocks and left thigh pain.  Skin: Negative for rash. Neurological: Negative for headaches, focal weakness or numbness.  10-point ROS otherwise negative.  ____________________________________________   PHYSICAL EXAM:  VITAL SIGNS: ED Triage Vitals  Enc Vitals Group     BP 06/24/17 0829 (!) 167/88     Pulse Rate 06/24/17 0829 92     Resp 06/24/17 0829 18     Temp 06/24/17 0829 98.2 F (36.8 C)     Temp Source 06/24/17 0829 Oral     SpO2 06/24/17 0829 100 %     Weight 06/24/17 0830 178 lb (80.7 kg)     Height 06/24/17 0830 5\' 3"  (1.6 m)     Pain Score 06/24/17 0830 10   Constitutional: Alert and oriented. Well appearing  and in no acute distress. Eyes: Conjunctivae are normal. Head: Atraumatic. Nose: No congestion/rhinnorhea. Mouth/Throat: Mucous membranes are moist.  Oropharynx non-erythematous. Neck: No stridor Cardiovascular: Normal rate, regular rhythm. Good peripheral circulation. Grossly normal heart sounds.   Respiratory: Normal respiratory effort.  No retractions. Lungs CTAB. Gastrointestinal: Soft and nontender. No distention.  Musculoskeletal: No lower extremity tenderness nor edema. No gross deformities of extremities. Mild tenderness to palpation of the superior gluteal region. No midline lumbar tenderness. Normal ROM of the left hip and knee. Mild tenderness to palpation of the left knee.  Neurologic:  Normal speech and language. No gross focal neurologic deficits are appreciated. 2+ patellar reflexes.  Skin:  Skin is warm, dry and intact. No rash noted.  ____________________________________________  RADIOLOGY  Dg Knee 2 Views Left  Result Date: 06/24/2017 CLINICAL DATA:  Left knee pain. EXAM: LEFT KNEE - 1-2 VIEW  COMPARISON:  None. FINDINGS: No evidence of fracture, dislocation, or joint effusion. Joint spaces are intact. Spurring of patella is noted. Vascular calcifications are noted. IMPRESSION: Mild patellar spurring. No other significant abnormality seen in the left knee. Electronically Signed   By: Marijo Conception, M.D.   On: 06/24/2017 09:43   Dg Hip Unilat W Or Wo Pelvis 2-3 Views Left  Result Date: 06/24/2017 CLINICAL DATA:  Left hip pain. EXAM: DG HIP (WITH OR WITHOUT PELVIS) 2-3V LEFT COMPARISON:  Fluoroscopic images of February 07, 2015. FINDINGS: Status post left total hip arthroplasty. No fracture or dislocation is noted. Right hip is unremarkable. IMPRESSION: No acute abnormality seen in the left hip. Status post left hip arthroplasty. Electronically Signed   By: Marijo Conception, M.D.   On: 06/24/2017 09:41     ____________________________________________   PROCEDURES  Procedure(s) performed:   Procedures  None ____________________________________________   INITIAL IMPRESSION / ASSESSMENT AND PLAN / ED COURSE  Pertinent labs & imaging results that were available during my care of the patient were reviewed by me and considered in my medical decision making (see chart for details).  Patient presents to the emergency department for evaluation of left hip/buttock pain that is most consistent clinically with sciatica.  The patient has no evidence on my exam to suggest acute spinal cord emergency.  Plan for plain film of the left hip and knee given pain in these joints and prior surgeries.  Will rule out pathologic fracture or other metastatic process.  No urinary symptoms to suspect renal stone.  Plan for pain control with tramadol and reassessment.  The patient's x-rays which are largely normal.  Plan for pain management and primary care physician follow-up.  Discussed return precautions in detail and provided them in writing.   At this time, I do not feel there is any life-threatening condition present. I have reviewed and discussed all results (EKG, imaging, lab, urine as appropriate), exam findings with patient. I have reviewed nursing notes and appropriate previous records.  I feel the patient is safe to be discharged home without further emergent workup. Discussed usual and customary return precautions. Patient and family (if present) verbalize understanding and are comfortable with this plan.  Patient will follow-up with their primary care provider. If they do not have a primary care provider, information for follow-up has been provided to them. All questions have been answered.  ____________________________________________  FINAL CLINICAL IMPRESSION(S) / ED DIAGNOSES  Final diagnoses:  Sciatica of left side  Left hip pain  Acute pain of left knee     MEDICATIONS GIVEN DURING THIS  VISIT:  Medications  traMADol (ULTRAM) tablet 50 mg (50 mg Oral Given 06/24/17 0916)     NEW OUTPATIENT MEDICATIONS STARTED DURING THIS VISIT:  Discharge Medication List as of 06/24/2017 10:01 AM    START taking these medications   Details  docusate sodium (COLACE) 100 MG capsule Take 1 capsule (100 mg total) by mouth every 12 (twelve) hours., Starting Mon 06/24/2017, Print    levothyroxine (SYNTHROID, LEVOTHROID) 50 MCG tablet TAKE 1 TABLET BY MOUTH ONCE DAILY BEFORE  BREAKFAST, Normal    oxyCODONE-acetaminophen (PERCOCET/ROXICET) 5-325 MG tablet Take 1 tablet by mouth every 8 (eight) hours as needed for severe pain., Starting Mon 06/24/2017, Print        Note:  This document was prepared using Dragon voice recognition software and may include unintentional dictation errors.  Nanda [name], MD Emergency Medicine    Long, Wonda [facility], MD 06/24/17  1436  

## 2017-07-12 ENCOUNTER — Other Ambulatory Visit: Payer: Self-pay | Admitting: Family Medicine

## 2017-07-12 DIAGNOSIS — M25562 Pain in left knee: Secondary | ICD-10-CM

## 2017-07-22 ENCOUNTER — Other Ambulatory Visit (HOSPITAL_COMMUNITY)
Admission: RE | Admit: 2017-07-22 | Discharge: 2017-07-22 | Disposition: A | Discharge status: Home / Self Care | Payer: Medicare HMO | Source: Ambulatory Visit | Attending: Nephrology | Admitting: Nephrology

## 2017-07-22 ENCOUNTER — Inpatient Hospital Stay (HOSPITAL_COMMUNITY): Payer: Medicare HMO | Attending: Oncology

## 2017-07-22 DIAGNOSIS — N184 Chronic kidney disease, stage 4 (severe): Secondary | ICD-10-CM | POA: Diagnosis not present

## 2017-07-22 DIAGNOSIS — D631 Anemia in chronic kidney disease: Secondary | ICD-10-CM | POA: Insufficient documentation

## 2017-07-22 DIAGNOSIS — R809 Proteinuria, unspecified: Secondary | ICD-10-CM | POA: Insufficient documentation

## 2017-07-22 DIAGNOSIS — I130 Hypertensive heart and chronic kidney disease with heart failure and stage 1 through stage 4 chronic kidney disease, or unspecified chronic kidney disease: Secondary | ICD-10-CM | POA: Diagnosis not present

## 2017-07-22 DIAGNOSIS — D500 Iron deficiency anemia secondary to blood loss (chronic): Secondary | ICD-10-CM

## 2017-07-22 DIAGNOSIS — Z794 Long term (current) use of insulin: Secondary | ICD-10-CM | POA: Diagnosis not present

## 2017-07-22 DIAGNOSIS — D509 Iron deficiency anemia, unspecified: Secondary | ICD-10-CM | POA: Diagnosis not present

## 2017-07-22 DIAGNOSIS — E559 Vitamin D deficiency, unspecified: Secondary | ICD-10-CM | POA: Insufficient documentation

## 2017-07-22 DIAGNOSIS — E1122 Type 2 diabetes mellitus with diabetic chronic kidney disease: Secondary | ICD-10-CM | POA: Insufficient documentation

## 2017-07-22 DIAGNOSIS — Z79899 Other long term (current) drug therapy: Secondary | ICD-10-CM | POA: Insufficient documentation

## 2017-07-22 DIAGNOSIS — I252 Old myocardial infarction: Secondary | ICD-10-CM | POA: Insufficient documentation

## 2017-07-22 DIAGNOSIS — E785 Hyperlipidemia, unspecified: Secondary | ICD-10-CM | POA: Diagnosis not present

## 2017-07-22 DIAGNOSIS — I10 Essential (primary) hypertension: Secondary | ICD-10-CM | POA: Insufficient documentation

## 2017-07-22 LAB — RENAL FUNCTION PANEL
Albumin: 3.8 g/dL (ref 3.5–5.0)
Anion gap: 10 (ref 5–15)
BUN: 51 mg/dL — AB (ref 6–20)
CALCIUM: 9 mg/dL (ref 8.9–10.3)
CO2: 24 mmol/L (ref 22–32)
CREATININE: 2.51 mg/dL — AB (ref 0.44–1.00)
Chloride: 106 mmol/L (ref 101–111)
GFR calc non Af Amer: 17 mL/min — ABNORMAL LOW (ref >60)
GFR, EST AFRICAN AMERICAN: 20 mL/min — AB (ref >60)
GLUCOSE: 220 mg/dL — AB (ref 65–99)
Phosphorus: 4.9 mg/dL — ABNORMAL HIGH (ref 2.5–4.6)
Potassium: 4.6 mmol/L (ref 3.5–5.1)
SODIUM: 140 mmol/L (ref 135–145)

## 2017-07-22 LAB — HEMOGLOBIN AND HEMATOCRIT, BLOOD
HCT: 36 % (ref 36.0–46.0)
Hemoglobin: 11.5 g/dL — ABNORMAL LOW (ref 12.0–15.0)

## 2017-07-22 LAB — CBC WITH DIFFERENTIAL/PLATELET
BASOS PCT: 1 %
Basophils Absolute: 0 10*3/uL (ref 0.0–0.1)
Eosinophils Absolute: 0.4 10*3/uL (ref 0.0–0.7)
Eosinophils Relative: 8 %
HEMATOCRIT: 36 % (ref 36.0–46.0)
HEMOGLOBIN: 11.6 g/dL — AB (ref 12.0–15.0)
LYMPHS ABS: 1.2 10*3/uL (ref 0.7–4.0)
Lymphocytes Relative: 21 %
MCH: 29.4 pg (ref 26.0–34.0)
MCHC: 32.2 g/dL (ref 30.0–36.0)
MCV: 91.4 fL (ref 78.0–100.0)
MONOS PCT: 6 %
Monocytes Absolute: 0.4 10*3/uL (ref 0.1–1.0)
NEUTROS ABS: 3.7 10*3/uL (ref 1.7–7.7)
NEUTROS PCT: 64 %
Platelets: 294 10*3/uL (ref 150–400)
RBC: 3.94 MIL/uL (ref 3.87–5.11)
RDW: 14.1 % (ref 11.5–15.5)
WBC: 5.7 10*3/uL (ref 4.0–10.5)

## 2017-07-22 LAB — PROTEIN / CREATININE RATIO, URINE
Creatinine, Urine: 125.91 mg/dL
Protein Creatinine Ratio: 2.03 mg/mg{Cre} — ABNORMAL HIGH (ref 0.00–0.15)
Total Protein, Urine: 256 mg/dL

## 2017-07-22 LAB — IRON AND TIBC
IRON: 75 ug/dL (ref 28–170)
SATURATION RATIOS: 23 % (ref 10.4–31.8)
TIBC: 319 ug/dL (ref 250–450)
UIBC: 244 ug/dL

## 2017-07-22 LAB — FERRITIN: Ferritin: 426 ng/mL — ABNORMAL HIGH (ref 11–307)

## 2017-07-23 LAB — VITAMIN D 25 HYDROXY (VIT D DEFICIENCY, FRACTURES): Vit D, 25-Hydroxy: 25.8 ng/mL — ABNORMAL LOW (ref 30.0–100.0)

## 2017-07-23 LAB — PTH, INTACT AND CALCIUM
CALCIUM TOTAL (PTH): 9.1 mg/dL (ref 8.7–10.3)
PTH: 33 pg/mL (ref 15–65)

## 2017-07-24 ENCOUNTER — Telehealth (HOSPITAL_COMMUNITY): Payer: Self-pay

## 2017-07-24 ENCOUNTER — Other Ambulatory Visit (HOSPITAL_COMMUNITY): Payer: Self-pay | Admitting: Adult Health

## 2017-07-24 DIAGNOSIS — D649 Anemia, unspecified: Secondary | ICD-10-CM

## 2017-07-24 NOTE — Telephone Encounter (Signed)
Patient called this afternoon ,worried over the lab results she had received yesterday. She states her blood has dropped from 13.4 to 11.6. She was very constipated and took 1/2 a bottle of mag.citrate and was able to have a BM. She states it was very dark and she is worried she is losing blood. She was adamant about coming in for labs tomorrow morning. She states she is sure her Hgb will be lower. She states it will only "take a few hours to get too low". Reviewed with provider, lab appt made for tomorrow at 1030. Explained to patient we would call with the results once they are received.  She verbalized understanding.

## 2017-07-24 NOTE — Telephone Encounter (Signed)
We can check CBC with diff; I have placed orders. Please ensure the appt is linked to her lab visit.   Thanks! Elzie Rings

## 2017-07-25 ENCOUNTER — Telehealth: Payer: Self-pay | Admitting: Gastroenterology

## 2017-07-25 ENCOUNTER — Telehealth (HOSPITAL_COMMUNITY): Payer: Self-pay

## 2017-07-25 ENCOUNTER — Inpatient Hospital Stay (HOSPITAL_COMMUNITY): Payer: Medicare HMO

## 2017-07-25 DIAGNOSIS — N184 Chronic kidney disease, stage 4 (severe): Secondary | ICD-10-CM

## 2017-07-25 DIAGNOSIS — D500 Iron deficiency anemia secondary to blood loss (chronic): Secondary | ICD-10-CM

## 2017-07-25 DIAGNOSIS — D509 Iron deficiency anemia, unspecified: Secondary | ICD-10-CM | POA: Diagnosis not present

## 2017-07-25 DIAGNOSIS — I130 Hypertensive heart and chronic kidney disease with heart failure and stage 1 through stage 4 chronic kidney disease, or unspecified chronic kidney disease: Secondary | ICD-10-CM | POA: Diagnosis not present

## 2017-07-25 DIAGNOSIS — Z79899 Other long term (current) drug therapy: Secondary | ICD-10-CM | POA: Diagnosis not present

## 2017-07-25 DIAGNOSIS — Z794 Long term (current) use of insulin: Secondary | ICD-10-CM | POA: Diagnosis not present

## 2017-07-25 DIAGNOSIS — I252 Old myocardial infarction: Secondary | ICD-10-CM | POA: Diagnosis not present

## 2017-07-25 DIAGNOSIS — E785 Hyperlipidemia, unspecified: Secondary | ICD-10-CM | POA: Diagnosis not present

## 2017-07-25 DIAGNOSIS — E1122 Type 2 diabetes mellitus with diabetic chronic kidney disease: Secondary | ICD-10-CM | POA: Diagnosis not present

## 2017-07-25 DIAGNOSIS — D631 Anemia in chronic kidney disease: Secondary | ICD-10-CM | POA: Diagnosis not present

## 2017-07-25 LAB — CBC WITH DIFFERENTIAL/PLATELET
BASOS ABS: 0 10*3/uL (ref 0.0–0.1)
BASOS PCT: 1 %
EOS ABS: 0.4 10*3/uL (ref 0.0–0.7)
Eosinophils Relative: 7 %
HCT: 33.1 % — ABNORMAL LOW (ref 36.0–46.0)
HEMOGLOBIN: 10.8 g/dL — AB (ref 12.0–15.0)
Lymphocytes Relative: 16 %
Lymphs Abs: 1 10*3/uL (ref 0.7–4.0)
MCH: 29.8 pg (ref 26.0–34.0)
MCHC: 32.6 g/dL (ref 30.0–36.0)
MCV: 91.4 fL (ref 78.0–100.0)
Monocytes Absolute: 0.6 10*3/uL (ref 0.1–1.0)
Monocytes Relative: 9 %
Neutro Abs: 4.2 10*3/uL (ref 1.7–7.7)
Neutrophils Relative %: 67 %
Platelets: 312 10*3/uL (ref 150–400)
RBC: 3.62 MIL/uL — ABNORMAL LOW (ref 3.87–5.11)
RDW: 14.1 % (ref 11.5–15.5)
WBC: 6.2 10*3/uL (ref 4.0–10.5)

## 2017-07-25 LAB — IRON AND TIBC
IRON: 102 ug/dL (ref 28–170)
Saturation Ratios: 35 % — ABNORMAL HIGH (ref 10.4–31.8)
TIBC: 291 ug/dL (ref 250–450)
UIBC: 189 ug/dL

## 2017-07-25 LAB — FERRITIN: FERRITIN: 353 ng/mL — AB (ref 11–307)

## 2017-07-25 NOTE — Telephone Encounter (Signed)
Pt said she just wants Dr. Oneida Alar to know what is going on with her.  Her hemoglobin is dropping kind of fast again. Oncology ordered and results in epic, but she said she was told that it would be tomorrow before they call her with a plan. She also wants Dr. Oneida Alar to know that she had some constipation yesterday and took 1/2 bottle of mag citrate and had some sandy colored stool and then had BLACK stool. Please advise!

## 2017-07-25 NOTE — Telephone Encounter (Signed)
Pt is aware.  

## 2017-07-25 NOTE — Telephone Encounter (Signed)
PLEASE CALL PT. HER BLOOD COUNT IS STABLE IT WAS 11.3 FEB 25 AND 10.8 FEB 28. IF SHE SEE BLACK STOOL THAT LOOKS LIKE TAR THAT IS CONTINUOUS SHE SHOULD GO TO THE ED. IF NOT SHE SHOULD WAIT TO HEAR FROM THE HEMATOLOGY FOLKS.

## 2017-07-25 NOTE — Telephone Encounter (Signed)
Patient requesting results of lab work today.  Told the patient her hgb was 10.8 today and the Iron studies are still pending.  She is asking if she needs to restart her aranesp shots again.  The last Aranesp 03/27/2017 and her last iron 03/29/2017.  Her next appointment and lab is August 20, 2017, with Dr. Walden Field. Instructed the patient her lab work will be reviewed and she will receive a phone call for next steps.  Verbalized understanding.

## 2017-07-25 NOTE — Telephone Encounter (Signed)
PATIENT CALLED STATING HER HEMOGLOBIN HAS DROPPED AGAIN, PLEASE CALL HER SHE HAS SOME QUESTIONS [phone]

## 2017-07-29 ENCOUNTER — Other Ambulatory Visit (HOSPITAL_COMMUNITY)
Admission: RE | Admit: 2017-07-29 | Discharge: 2017-07-29 | Disposition: A | Discharge status: Home / Self Care | Payer: Medicare HMO | Source: Ambulatory Visit | Attending: Family Medicine | Admitting: Family Medicine

## 2017-07-29 ENCOUNTER — Other Ambulatory Visit: Payer: Self-pay | Admitting: Family Medicine

## 2017-07-29 DIAGNOSIS — D500 Iron deficiency anemia secondary to blood loss (chronic): Secondary | ICD-10-CM | POA: Diagnosis not present

## 2017-07-29 LAB — CBC WITH DIFFERENTIAL/PLATELET
Basophils Absolute: 0 10*3/uL (ref 0.0–0.1)
Basophils Relative: 1 %
EOS ABS: 0.3 10*3/uL (ref 0.0–0.7)
Eosinophils Relative: 5 %
HCT: 27.5 % — ABNORMAL LOW (ref 36.0–46.0)
Hemoglobin: 9 g/dL — ABNORMAL LOW (ref 12.0–15.0)
LYMPHS ABS: 1.3 10*3/uL (ref 0.7–4.0)
LYMPHS PCT: 20 %
MCH: 30.1 pg (ref 26.0–34.0)
MCHC: 32.7 g/dL (ref 30.0–36.0)
MCV: 92 fL (ref 78.0–100.0)
Monocytes Absolute: 0.5 10*3/uL (ref 0.1–1.0)
Monocytes Relative: 7 %
NEUTROS ABS: 4.4 10*3/uL (ref 1.7–7.7)
Neutrophils Relative %: 67 %
Platelets: 312 10*3/uL (ref 150–400)
RBC: 2.99 MIL/uL — ABNORMAL LOW (ref 3.87–5.11)
RDW: 14.6 % (ref 11.5–15.5)
WBC: 6.5 10*3/uL (ref 4.0–10.5)

## 2017-07-30 ENCOUNTER — Other Ambulatory Visit: Payer: Self-pay

## 2017-07-30 ENCOUNTER — Observation Stay (HOSPITAL_COMMUNITY)
Admission: EM | Admit: 2017-07-30 | Discharge: 2017-07-31 | Disposition: A | Discharge status: Home / Self Care | Payer: Medicare HMO | Attending: Internal Medicine | Admitting: Internal Medicine

## 2017-07-30 ENCOUNTER — Encounter (HOSPITAL_COMMUNITY): Payer: Self-pay | Admitting: Emergency Medicine

## 2017-07-30 DIAGNOSIS — I169 Hypertensive crisis, unspecified: Secondary | ICD-10-CM | POA: Diagnosis present

## 2017-07-30 DIAGNOSIS — I252 Old myocardial infarction: Secondary | ICD-10-CM | POA: Diagnosis not present

## 2017-07-30 DIAGNOSIS — E875 Hyperkalemia: Secondary | ICD-10-CM | POA: Diagnosis not present

## 2017-07-30 DIAGNOSIS — K921 Melena: Secondary | ICD-10-CM | POA: Diagnosis not present

## 2017-07-30 DIAGNOSIS — E1122 Type 2 diabetes mellitus with diabetic chronic kidney disease: Secondary | ICD-10-CM | POA: Insufficient documentation

## 2017-07-30 DIAGNOSIS — I130 Hypertensive heart and chronic kidney disease with heart failure and stage 1 through stage 4 chronic kidney disease, or unspecified chronic kidney disease: Secondary | ICD-10-CM | POA: Diagnosis not present

## 2017-07-30 DIAGNOSIS — Z79899 Other long term (current) drug therapy: Secondary | ICD-10-CM | POA: Insufficient documentation

## 2017-07-30 DIAGNOSIS — E039 Hypothyroidism, unspecified: Secondary | ICD-10-CM | POA: Insufficient documentation

## 2017-07-30 DIAGNOSIS — Z794 Long term (current) use of insulin: Secondary | ICD-10-CM | POA: Diagnosis not present

## 2017-07-30 DIAGNOSIS — Z96642 Presence of left artificial hip joint: Secondary | ICD-10-CM | POA: Diagnosis not present

## 2017-07-30 DIAGNOSIS — D500 Iron deficiency anemia secondary to blood loss (chronic): Principal | ICD-10-CM | POA: Insufficient documentation

## 2017-07-30 DIAGNOSIS — K920 Hematemesis: Secondary | ICD-10-CM | POA: Diagnosis not present

## 2017-07-30 DIAGNOSIS — K922 Gastrointestinal hemorrhage, unspecified: Secondary | ICD-10-CM

## 2017-07-30 DIAGNOSIS — I5042 Chronic combined systolic (congestive) and diastolic (congestive) heart failure: Secondary | ICD-10-CM | POA: Diagnosis not present

## 2017-07-30 DIAGNOSIS — D638 Anemia in other chronic diseases classified elsewhere: Secondary | ICD-10-CM | POA: Diagnosis not present

## 2017-07-30 DIAGNOSIS — D649 Anemia, unspecified: Secondary | ICD-10-CM | POA: Diagnosis present

## 2017-07-30 DIAGNOSIS — I10 Essential (primary) hypertension: Secondary | ICD-10-CM | POA: Diagnosis not present

## 2017-07-30 DIAGNOSIS — N184 Chronic kidney disease, stage 4 (severe): Secondary | ICD-10-CM | POA: Insufficient documentation

## 2017-07-30 DIAGNOSIS — E1129 Type 2 diabetes mellitus with other diabetic kidney complication: Secondary | ICD-10-CM | POA: Diagnosis not present

## 2017-07-30 DIAGNOSIS — K625 Hemorrhage of anus and rectum: Secondary | ICD-10-CM | POA: Diagnosis present

## 2017-07-30 LAB — BASIC METABOLIC PANEL
ANION GAP: 11 (ref 5–15)
BUN: 72 mg/dL — ABNORMAL HIGH (ref 6–20)
CHLORIDE: 109 mmol/L (ref 101–111)
CO2: 21 mmol/L — AB (ref 22–32)
CREATININE: 2.65 mg/dL — AB (ref 0.44–1.00)
Calcium: 9.5 mg/dL (ref 8.9–10.3)
GFR calc non Af Amer: 16 mL/min — ABNORMAL LOW (ref >60)
GFR, EST AFRICAN AMERICAN: 19 mL/min — AB (ref >60)
Glucose, Bld: 220 mg/dL — ABNORMAL HIGH (ref 65–99)
Potassium: 5.5 mmol/L — ABNORMAL HIGH (ref 3.5–5.1)
SODIUM: 141 mmol/L (ref 135–145)

## 2017-07-30 LAB — CBC
HCT: 25.6 % — ABNORMAL LOW (ref 36.0–46.0)
HEMOGLOBIN: 8.4 g/dL — AB (ref 12.0–15.0)
MCH: 30.5 pg (ref 26.0–34.0)
MCHC: 32.8 g/dL (ref 30.0–36.0)
MCV: 93.1 fL (ref 78.0–100.0)
Platelets: 347 10*3/uL (ref 150–400)
RBC: 2.75 MIL/uL — AB (ref 3.87–5.11)
RDW: 15 % (ref 11.5–15.5)
WBC: 6.5 10*3/uL (ref 4.0–10.5)

## 2017-07-30 LAB — PREPARE RBC (CROSSMATCH)

## 2017-07-30 LAB — POC OCCULT BLOOD, ED: FECAL OCCULT BLD: POSITIVE — AB

## 2017-07-30 LAB — GLUCOSE, CAPILLARY: Glucose-Capillary: 128 mg/dL — ABNORMAL HIGH (ref 65–99)

## 2017-07-30 MED ORDER — LORAZEPAM 2 MG/ML IJ SOLN: 0.5000 mg | Freq: Four times a day (QID) | INTRAMUSCULAR | Status: DC | PRN

## 2017-07-30 MED ORDER — INSULIN DETEMIR 100 UNIT/ML ~~LOC~~ SOLN
15.0000 [IU] | Freq: Every day | SUBCUTANEOUS | Status: DC
  Administered 2017-07-30: 15 [IU] via SUBCUTANEOUS
  Filled 2017-07-30 (×2): qty 0.15

## 2017-07-30 MED ORDER — DOCUSATE SODIUM 100 MG PO CAPS
100.0000 mg | ORAL_CAPSULE | Freq: Every day | ORAL | Status: DC
  Administered 2017-07-31: 100 mg via ORAL
  Filled 2017-07-30: qty 1

## 2017-07-30 MED ORDER — ZOLPIDEM TARTRATE 5 MG PO TABS
5.0000 mg | ORAL_TABLET | Freq: Every evening | ORAL | Status: DC | PRN
  Administered 2017-07-30: 5 mg via ORAL
  Filled 2017-07-30 (×2): qty 1

## 2017-07-30 MED ORDER — FENOFIBRATE 54 MG PO TABS
54.0000 mg | ORAL_TABLET | Freq: Every day | ORAL | Status: DC
  Administered 2017-07-31: 54 mg via ORAL
  Filled 2017-07-30 (×3): qty 1

## 2017-07-30 MED ORDER — INSULIN ASPART 100 UNIT/ML ~~LOC~~ SOLN: 0.0000 [IU] | Freq: Three times a day (TID) | SUBCUTANEOUS | Status: DC

## 2017-07-30 MED ORDER — GABAPENTIN 300 MG PO CAPS
600.0000 mg | ORAL_CAPSULE | Freq: Every day | ORAL | Status: DC
  Administered 2017-07-30: 600 mg via ORAL
  Filled 2017-07-30: qty 2

## 2017-07-30 MED ORDER — INSULIN ASPART 100 UNIT/ML ~~LOC~~ SOLN: 0.0000 [IU] | Freq: Every day | SUBCUTANEOUS | Status: DC

## 2017-07-30 MED ORDER — ACETAMINOPHEN 325 MG PO TABS: 650.0000 mg | ORAL_TABLET | Freq: Four times a day (QID) | ORAL | Status: DC | PRN

## 2017-07-30 MED ORDER — FEBUXOSTAT 40 MG PO TABS
40.0000 mg | ORAL_TABLET | Freq: Every day | ORAL | Status: DC
  Administered 2017-07-31: 40 mg via ORAL
  Filled 2017-07-30: qty 1

## 2017-07-30 MED ORDER — LEVOTHYROXINE SODIUM 50 MCG PO TABS
50.0000 ug | ORAL_TABLET | Freq: Every day | ORAL | Status: DC
  Administered 2017-07-31: 50 ug via ORAL
  Filled 2017-07-30: qty 1

## 2017-07-30 MED ORDER — HYDROCODONE-ACETAMINOPHEN 5-325 MG PO TABS: 1.0000 | ORAL_TABLET | ORAL | Status: DC | PRN

## 2017-07-30 MED ORDER — ADULT MULTIVITAMIN W/MINERALS CH
1.0000 | ORAL_TABLET | Freq: Every day | ORAL | Status: DC
  Administered 2017-07-31: 1 via ORAL
  Filled 2017-07-30: qty 1

## 2017-07-30 MED ORDER — AMLODIPINE BESYLATE 5 MG PO TABS
10.0000 mg | ORAL_TABLET | Freq: Every day | ORAL | Status: DC
Start: 2017-07-31 — End: 2017-07-31
  Administered 2017-07-31: 10 mg via ORAL
  Filled 2017-07-30: qty 2

## 2017-07-30 MED ORDER — PANTOPRAZOLE SODIUM 40 MG IV SOLR
40.0000 mg | Freq: Two times a day (BID) | INTRAVENOUS | Status: DC
  Administered 2017-07-30 – 2017-07-31 (×2): 40 mg via INTRAVENOUS
  Filled 2017-07-30 (×2): qty 40

## 2017-07-30 MED ORDER — ACETAMINOPHEN 650 MG RE SUPP: 650.0000 mg | Freq: Four times a day (QID) | RECTAL | Status: DC | PRN

## 2017-07-30 MED ORDER — ONDANSETRON HCL 4 MG/2ML IJ SOLN: 4.0000 mg | Freq: Four times a day (QID) | INTRAMUSCULAR | Status: DC | PRN

## 2017-07-30 MED ORDER — ONDANSETRON HCL 4 MG PO TABS: 4.0000 mg | ORAL_TABLET | Freq: Four times a day (QID) | ORAL | Status: DC | PRN

## 2017-07-30 MED ORDER — METOPROLOL TARTRATE 25 MG PO TABS
12.5000 mg | ORAL_TABLET | Freq: Two times a day (BID) | ORAL | Status: DC
  Administered 2017-07-30 – 2017-07-31 (×2): 12.5 mg via ORAL
  Filled 2017-07-30 (×2): qty 1

## 2017-07-30 MED ORDER — SODIUM CHLORIDE 0.9 % IV SOLN: Freq: Once | INTRAVENOUS | Status: DC

## 2017-07-30 MED ORDER — SODIUM CHLORIDE 0.9 % IV SOLN
10.0000 mL/h | Freq: Once | INTRAVENOUS | Status: AC
  Administered 2017-07-30: 10 mL/h via INTRAVENOUS

## 2017-07-30 NOTE — ED Provider Notes (Signed)
[facility] EMERGENCY DEPARTMENT Provider Note   CSN: [id number] Arrival date & time: 07/30/17  1407     History   Chief Complaint Chief Complaint  Patient presents with  . Abnormal Lab    HPI [name] is a 77 y.o. female.  She presents to the emergency department stating she is going to need a transfusion.  She has had a history of GI bleeds where resources have been identified as best as I can tell.  She has required transfusions intermittently, sounds like she stays stable for a while but has an acute drop needs to be transfused and then stabilizes for a while.  She knows when this happens when she starts packing black stool.  She states she has been having this for the last week or 2.  Currently is not associate with any chest pain or shortness of breath or fatigue although when she is been very anemic these have been there in the past.  The history is provided by the patient.  Rectal Bleeding  Quality:  Black and tarry Amount:  Moderate Timing:  Intermittent Chronicity:  Recurrent Context: spontaneously   Context: not hemorrhoids and not rectal pain   Similar prior episodes: yes   Relieved by:  Nothing Worsened by:  Nothing Ineffective treatments:  None tried Associated symptoms: no abdominal pain, no dizziness, no fever, no hematemesis, no light-headedness, no loss of consciousness, no recent illness and no vomiting     Past Medical History:  Diagnosis Date  . Anemia   . Arthritis   . Blood transfusion without reported diagnosis   . Cataract   . Chronic anemia   . Chronic combined systolic and diastolic CHF (congestive heart failure) ([place])    a. 2D echo 08/2016 at [facility]: EF 50-55% with inferior wall HK, impaired LV filling, fair study.  . CKD (chronic kidney disease), stage III ([place])   . Diabetes ([place])   . Gastritis   . GERD (gastroesophageal reflux disease)   . Gout   . HTN (hypertension)   . Hyperlipidemia   . Iron deficiency anemia 11/08/2016  .  Normocytic anemia 10/26/2016  . NSTEMI (non-ST elevated myocardial infarction) ([place])    a. Complex admission 08/2016 - with severe hyperglycemia, AKI on CKD, severe anemia down to Hgb 6.8, acute combined CHF, troponin of 8.5, cath deferred due to renal dysfunction.  . Thyroid disease     Patient Active Problem List   Diagnosis Date Noted  . Essential hypertension, benign 12/13/2016  . Hypothyroidism 12/13/2016  . Iron deficiency anemia 11/08/2016  . Aortic atherosclerosis ([place]) 10/25/2016  . History of non-ST elevation myocardial infarction (NSTEMI) 10/04/2016  . Neuropathy 09/20/2016  . Chronic kidney disease, stage IV (severe) ([place]) 08/03/2015  . Hip fracture ([place]) 02/05/2015  . Diabetes mellitus with stage 4 chronic kidney disease ([place]) 02/05/2015  . HTN (hypertension) 02/05/2015  . Gout 02/05/2015  . Left humeral fracture 02/05/2015  . Hyperlipidemia associated with type 2 diabetes mellitus ([place]) 05/01/2013    Past Surgical History:  Procedure Laterality Date  . COLONOSCOPY WITH PROPOFOL N/A 10/08/2016   Procedure: COLONOSCOPY WITH PROPOFOL;  Surgeon: Ronnette Juniper, MD;  Location: [place];  Service: Gastroenterology;  Laterality: N/A;  . ESOPHAGOGASTRODUODENOSCOPY N/A 10/06/2016   Procedure: ESOPHAGOGASTRODUODENOSCOPY (EGD);  Surgeon: Otis Brace, MD;  Location: [facility] ENDOSCOPY;  Service: Gastroenterology;  Laterality: N/A;  . ESOPHAGOGASTRODUODENOSCOPY (EGD) WITH PROPOFOL N/A 03/12/2017   Procedure: ESOPHAGOGASTRODUODENOSCOPY (EGD) WITH PROPOFOL;  Surgeon: Danie Binder, MD;  [facility]  END[facility];  Service: Endoscopy;  Laterality: N/A;  . GIVENS CAPSULE STUDY  10/08/2016   Procedure: GIVENS CAPSULE STUDY;  Surgeon: Ronnette Juniper, MD;  Location: [place];  Service: Gastroenterology;;  . Freda Munro CAPSULE STUDY N/A 10/29/2016   Procedure: GIVENS CAPSULE STUDY;  Surgeon: Danie Binder, MD;  [name];  Service: Endoscopy;  Laterality: N/A;  . ORIF HUMERUS  FRACTURE Left 02/07/2015   Procedure: OPEN REDUCTION INTERNAL FIXATION (ORIF) PROXIMAL HUMERUS FRACTURE;  Surgeon: Marybelle Killings, MD;  Location: [place];  Service: Orthopedics;  Laterality: Left;  . THYROID SURGERY    . TOTAL HIP ARTHROPLASTY Left 02/07/2015   Procedure: TOTAL HIP ARTHROPLASTY ANTERIOR APPROACH ;  Surgeon: Marybelle Killings, MD;  Location: [place];  Service: Orthopedics;  Laterality: Left;  . WRIST SURGERY Left     OB History    No data available       Home Medications    Prior to Admission medications   Medication Sig Start Date End Date Taking? Authorizing Provider  amLODipine (NORVASC) 10 MG tablet Take 1 tablet (10 mg total) by mouth daily. 01/25/17   [name], MD  calcitRIOL (ROCALTROL) 0.25 MCG capsule Take 1 capsule (0.25 mcg total) by mouth daily. Patient not taking: Reported on 06/19/2017 06/18/17   Cassandria Anger, MD  docusate sodium (COLACE) 100 MG capsule Take 1 capsule (100 mg total) by mouth every 12 (twelve) hours. 06/24/17   Long, Wonda Olds, MD  febuxostat (ULORIC) 40 MG tablet Take 1 tablet (40 mg total) by mouth daily. 06/04/17   [name], MD  fenofibrate 54 MG tablet Take 1 tablet (54 mg total) by mouth daily. 06/18/17   Cassandria Anger, MD  furosemide (LASIX) 20 MG tablet Take 20 mg by mouth as needed. Only has to take it when she gains 2 lbs 09/20/16   [provider]  gabapentin (NEURONTIN) 300 MG capsule Take 2 capsules (600 mg total) by mouth at bedtime. 06/04/17   [name], MD  hydrALAZINE (APRESOLINE) 25 MG tablet TAKE 1 TABLET BY MOUTH EVERY 8 HOURS AS NEEDED 04/29/17   [name], MD  Insulin Glargine (TOUJEO MAX SOLOSTAR) 300 UNIT/ML SOPN Inject 20 Units at bedtime into the skin. Patient taking differently: Inject 24 Units into the skin at bedtime.  04/08/17   Cassandria Anger, MD  levothyroxine (SYNTHROID, LEVOTHROID) 50 MCG tablet TAKE 1 TABLET BY MOUTH ONCE DAILY BEFORE  BREAKFAST 06/24/17   Cassandria Anger, MD  metoprolol tartrate (LOPRESSOR) 25 MG tablet Take 12.5 mg by mouth 2 (two) times daily.    [provider]  Multiple Vitamin (MULTIVITAMIN WITH MINERALS) TABS tablet Take 1 tablet by mouth daily.    [provider]  ondansetron (ZOFRAN) 4 MG tablet Take 1 tablet (4 mg total) by mouth every 8 (eight) hours as needed for nausea or vomiting. 10/25/16   [name], MD  oxyCODONE-acetaminophen (PERCOCET/ROXICET) 5-325 MG tablet Take 1 tablet by mouth every 8 (eight) hours as needed for severe pain. 06/24/17   Long, Wonda Olds, MD  pantoprazole (PROTONIX) 40 MG tablet Take 1 tablet (40 mg total) by mouth daily. 30 minutes before breakfast 02/13/17   Annitta Needs, NP    Family History Family History  Problem Relation Age of Onset  . Diabetes Mother   . Asthma Mother   . Early death Mother 66       Pneumonia  . Early death  Father        Killed at rodeo  . CAD Neg Hx   . GI Bleed Neg Hx     Social History Social History   Tobacco Use  . Smoking status: Never Smoker  . Smokeless tobacco: Never Used  Substance Use Topics  . Alcohol use: No    Alcohol/week: 0.0 oz  . Drug use: No     Allergies   Insulin glargine and Statins   Review of Systems Review of Systems  Constitutional: Negative for chills and fever.  HENT: Negative for ear pain and sore throat.   Eyes: Negative for pain and visual disturbance.  Respiratory: Negative for cough and shortness of breath.   Cardiovascular: Negative for chest pain and palpitations.  Gastrointestinal: Positive for blood in stool and hematochezia. Negative for abdominal pain, hematemesis and vomiting.  Genitourinary: Negative for dysuria and hematuria.  Musculoskeletal: Negative for arthralgias and back pain.  Skin: Negative for color change and rash.  Neurological: Negative for dizziness, seizures, loss of consciousness, syncope and light-headedness.  All other systems reviewed and are  negative.    Physical Exam Updated Vital Signs BP (!) 135/53 (BP Location: Right Arm)   Pulse 68   Temp 97.8 F (36.6 C) (Oral)   Resp 18   Ht 5\' 8"  (1.727 m)   Wt 78.9 kg (174 lb)   SpO2 98%   BMI 26.46 kg/m   Physical Exam  Constitutional: She appears well-developed and well-nourished. No distress.  HENT:  Head: Normocephalic and atraumatic.  Eyes: Conjunctivae are normal.  Neck: Neck supple.  Cardiovascular: Normal rate and regular rhythm.  No murmur heard. Pulmonary/Chest: Effort normal and breath sounds normal. No respiratory distress.  Abdominal: Soft. There is no tenderness.  Genitourinary: Rectal exam shows guaiac positive stool. Rectal exam shows no mass.  Musculoskeletal: She exhibits no edema, tenderness or deformity.  Neurological: She is alert.  Skin: Skin is warm and dry. Capillary refill takes less than 2 seconds.  Psychiatric: She has a normal mood and affect.  Nursing note and vitals reviewed.    ED Treatments / Results  Labs (all labs ordered are listed, but only abnormal results are displayed) Labs Reviewed  CBC - Abnormal; Notable for the following components:      Result Value   RBC 2.75 (*)    Hemoglobin 8.4 (*)    HCT 25.6 (*)    All other components within normal limits  BASIC METABOLIC PANEL - Abnormal; Notable for the following components:   Potassium 5.5 (*)    CO2 21 (*)    Glucose, Bld 220 (*)    BUN 72 (*)    Creatinine, Ser 2.65 (*)    GFR calc non Af Amer 16 (*)    GFR calc Af Amer 19 (*)    All other components within normal limits  POC OCCULT BLOOD, ED - Abnormal; Notable for the following components:   Fecal Occult Bld POSITIVE (*)    All other components within normal limits  TYPE AND SCREEN    EKG  EKG Interpretation None       Radiology No results found.  Procedures Procedures (including critical care time)  Medications Ordered in ED Medications - No data to display   Initial Impression / Assessment  and Plan / ED Course  I have reviewed the triage vital signs and the nursing notes.  Pertinent labs & imaging results that were available during my care of the patient were reviewed by  me and considered in my medical decision making (see chart for details).   discussed with hospitalist Dr Myna Hidalgo who will admit patient. PRBC ordered.    Final Clinical Impressions(s) / ED Diagnoses   Final diagnoses:  Gastrointestinal hemorrhage, unspecified gastrointestinal hemorrhage type  Anemia, unspecified type    ED Discharge Orders    None       Hayden Rasmussen, MD 07/31/17 1758

## 2017-07-30 NOTE — ED Triage Notes (Signed)
Pt was sent from PCP for hemoglobin of 9.0. Pt states having weakness but no other complaints.

## 2017-07-30 NOTE — H&P (Signed)
History and Physical    [name]:[id number] DOB: 1940/08/06 DOA: 07/30/2017  PCP: Caren Macadam, MD   Patient coming from: Home  Chief Complaint: melena, low Hgb   HPI: [name] is a 77 y.o. female with medical history significant for the chronic kidney disease stage IV, insulin-dependent diabetes mellitus, hypothyroidism, hypertension, now presenting to the emergency department for evaluation of fatigue, melena, and low hemoglobin on outpatient blood work.  Patient reports developing melena approximately 1 week ago, had outpatient blood work performed revealing a significant drop in hemoglobin, the ED for further evaluation.  She denies abdominal pain, nausea, or vomiting.  She denies bright red blood per rectum.  She is no longer taking an antiplatelet.  She had extensive GI workup performed last year with the etiology of blood loss not identified.  Hemoglobin had been 13.4 on January 24, down to 11.6 a week ago, and then 9 on recent CBC.  ED Course: Upon arrival to the ED, patient is found to be afebrile, saturating well on room air, and with vitals otherwise normal.  Chemistry panel reveals a potassium of 5.5, BUN of 72, and creatinine of 2.65, slightly up from recent priors.  CBC is notable for hemoglobin of 8.4, down from 11.6 a week ago, and down from 13.4 in late January.  Fecal occult blood testing is positive.  2 units of packed red blood cells were ordered for immediate transfusion by the ED physician, patient remained hemodynamically stable, and she will be observed on the telemetry unit for ongoing evaluation and management of melena with symptomatic anemia.  Review of Systems:  All other systems reviewed and apart from HPI, are negative.  Past Medical History:  Diagnosis Date  . Anemia   . Arthritis   . Blood transfusion without reported diagnosis   . Cataract   . Chronic anemia   . Chronic combined systolic and diastolic CHF (congestive heart failure) ([place])     a. 2D echo 08/2016 at [facility]: EF 50-55% with inferior wall HK, impaired LV filling, fair study.  . CKD (chronic kidney disease), stage III ([place])   . Diabetes ([place])   . Gastritis   . GERD (gastroesophageal reflux disease)   . Gout   . HTN (hypertension)   . Hyperlipidemia   . Iron deficiency anemia 11/08/2016  . Normocytic anemia 10/26/2016  . NSTEMI (non-ST elevated myocardial infarction) ([employer])    a. Complex admission 08/2016 - with severe hyperglycemia, AKI on CKD, severe anemia down to Hgb 6.8, acute combined CHF, troponin of 8.5, cath deferred due to renal dysfunction.  . Thyroid disease     Past Surgical History:  Procedure Laterality Date  . COLONOSCOPY WITH PROPOFOL N/A 10/08/2016   Procedure: COLONOSCOPY WITH PROPOFOL;  Surgeon: Ronnette Juniper, MD;  Location: [place];  Service: Gastroenterology;  Laterality: N/A;  . ESOPHAGOGASTRODUODENOSCOPY N/A 10/06/2016   Procedure: ESOPHAGOGASTRODUODENOSCOPY (EGD);  Surgeon: Otis Brace, MD;  Location: [facility] ENDOSCOPY;  Service: Gastroenterology;  Laterality: N/A;  . ESOPHAGOGASTRODUODENOSCOPY (EGD) WITH PROPOFOL N/A 03/12/2017   Procedure: ESOPHAGOGASTRODUODENOSCOPY (EGD) WITH PROPOFOL;  Surgeon: Danie Binder, MD;  [name];  Service: Endoscopy;  Laterality: N/A;  . GIVENS CAPSULE STUDY  10/08/2016   Procedure: GIVENS CAPSULE STUDY;  Surgeon: Ronnette Juniper, MD;  Location: [place];  Service: Gastroenterology;;  . Freda Munro CAPSULE STUDY N/A 10/29/2016   Procedure: GIVENS CAPSULE STUDY;  Surgeon: Danie Binder, MD;  [name];  Service: Endoscopy;  Laterality: N/A;  .  ORIF HUMERUS FRACTURE Left 02/07/2015   Procedure: OPEN REDUCTION INTERNAL FIXATION (ORIF) PROXIMAL HUMERUS FRACTURE;  Surgeon: Marybelle Killings, MD;  Location: [place];  Service: Orthopedics;  Laterality: Left;  . THYROID SURGERY    . TOTAL HIP ARTHROPLASTY Left 02/07/2015   Procedure: TOTAL HIP ARTHROPLASTY ANTERIOR APPROACH ;  Surgeon: Marybelle Killings, MD;   Location: [place];  Service: Orthopedics;  Laterality: Left;  . WRIST SURGERY Left      reports that  has never smoked. she has never used smokeless tobacco. She reports that she does not drink alcohol or use drugs.  Allergies  Allergen Reactions  . Insulin Glargine Swelling    "Makes me swell like a balloon all over", including face, but without any respiratory distress or rashes. Associated with weight gain.  . Statins Other (See Comments)    "I've tried them all; my muscle aches were so bad I couldn't walk".    Family History  Problem Relation Age of Onset  . Diabetes Mother   . Asthma Mother   . Early death Mother 20       Pneumonia  . Early death Father        Killed at rodeo  . CAD Neg Hx   . GI Bleed Neg Hx      Prior to Admission medications   Medication Sig Start Date End Date Taking? Authorizing Provider  amLODipine (NORVASC) 10 MG tablet Take 1 tablet (10 mg total) by mouth daily. 01/25/17  Yes [name], MD  docusate sodium (COLACE) 100 MG capsule Take 1 capsule (100 mg total) by mouth every 12 (twelve) hours. Patient taking differently: Take 100 mg by mouth daily.  06/24/17  Yes Long, Wonda Olds, MD  febuxostat (ULORIC) 40 MG tablet Take 1 tablet (40 mg total) by mouth daily. 06/04/17  Yes [name], MD  fenofibrate 54 MG tablet Take 1 tablet (54 mg total) by mouth daily. 06/18/17  Yes Nida, Marella Chimes, MD  furosemide (LASIX) 20 MG tablet Take 20 mg by mouth as needed. Only has to take it when she gains 2 lbs 09/20/16  Yes [provider]  gabapentin (NEURONTIN) 300 MG capsule Take 2 capsules (600 mg total) by mouth at bedtime. 06/04/17  Yes [name], MD  hydrALAZINE (APRESOLINE) 25 MG tablet TAKE 1 TABLET BY MOUTH EVERY 8 HOURS AS NEEDED 04/29/17  Yes [name], MD  Insulin Glargine (TOUJEO MAX SOLOSTAR) 300 UNIT/ML SOPN Inject 20 Units at bedtime into the skin. Patient taking differently: Inject 24 Units into the skin at  bedtime.  04/08/17  Yes Nida, Marella Chimes, MD  levothyroxine (SYNTHROID, LEVOTHROID) 50 MCG tablet TAKE 1 TABLET BY MOUTH ONCE DAILY BEFORE  BREAKFAST 06/24/17  Yes Nida, Marella Chimes, MD  metoprolol tartrate (LOPRESSOR) 25 MG tablet Take 12.5 mg by mouth 2 (two) times daily.   Yes [provider]  Multiple Vitamin (MULTIVITAMIN WITH MINERALS) TABS tablet Take 1 tablet by mouth daily.   Yes [provider]  ondansetron (ZOFRAN) 4 MG tablet Take 1 tablet (4 mg total) by mouth every 8 (eight) hours as needed for nausea or vomiting. 10/25/16  Yes [name], MD  pantoprazole (PROTONIX) 40 MG tablet Take 1 tablet (40 mg total) by mouth daily. 30 minutes before breakfast 02/13/17  Yes Annitta Needs, NP  calcitRIOL (ROCALTROL) 0.25 MCG capsule Take 1 capsule (0.25 mcg total) by mouth daily. Patient not taking: Reported on 07/30/2017 06/18/17  Cassandria Anger, MD    Physical Exam: Vitals:   07/30/17 1447 07/30/17 1838 07/30/17 1900 07/30/17 1930  BP: (!) 135/53 137/65 (!) 139/50 (!) 140/52  Pulse: 68 67 70 73  Resp: 18 18 17 17   Temp: 97.8 F (36.6 C)     TempSrc: Oral     SpO2: 98% 98% 98% 97%  Weight:      Height:          Constitutional: NAD, calm  Eyes: PERTLA, lids and conjunctivae normal ENMT: Mucous membranes are moist. Posterior pharynx clear of any exudate or lesions.   Neck: normal, supple, no masses, no thyromegaly Respiratory: clear to auscultation bilaterally, no wheezing, no crackles. Normal respiratory effort.   Cardiovascular: S1 & S2 heard, regular rate and rhythm. No significant JVD. Abdomen: No distension, no tenderness, no masses palpated. Bowel sounds active.  Musculoskeletal: no clubbing / cyanosis. No joint deformity upper and lower extremities.    Skin: no significant rashes, lesions, ulcers. Warm, dry, well-perfused. Neurologic: CN 2-12 grossly intact. Sensation intact. Strength 5/5 in all 4 limbs.  Psychiatric: Alert and  oriented x 3. Anxious, cooperative.     Labs on Admission: I have personally reviewed following labs and imaging studies  CBC: Recent Labs  Lab 07/25/17 1014 07/29/17 1459 07/30/17 1601  WBC 6.2 6.5 6.5  NEUTROABS 4.2 4.4  --   HGB 10.8* 9.0* 8.4*  HCT 33.1* 27.5* 25.6*  MCV 91.4 92.0 93.1  PLT 312 312 811   Basic Metabolic Panel: Recent Labs  Lab 07/30/17 1601  NA 141  K 5.5*  CL 109  CO2 21*  GLUCOSE 220*  BUN 72*  CREATININE 2.65*  CALCIUM 9.5   GFR: Estimated Creatinine Clearance: 19.6 mL/min (A) (by C-G formula based on SCr of 2.65 mg/dL (H)). Liver Function Tests: No results for input(s): AST, ALT, ALKPHOS, BILITOT, PROT, ALBUMIN in the last 168 hours. No results for input(s): LIPASE, AMYLASE in the last 168 hours. No results for input(s): AMMONIA in the last 168 hours. Coagulation Profile: No results for input(s): INR, PROTIME in the last 168 hours. Cardiac Enzymes: No results for input(s): CKTOTAL, CKMB, CKMBINDEX, TROPONINI in the last 168 hours. BNP (last 3 results) No results for input(s): PROBNP in the last 8760 hours. HbA1C: No results for input(s): HGBA1C in the last 72 hours. CBG: No results for input(s): GLUCAP in the last 168 hours. Lipid Profile: No results for input(s): CHOL, HDL, LDLCALC, TRIG, CHOLHDL, LDLDIRECT in the last 72 hours. Thyroid Function Tests: No results for input(s): TSH, T4TOTAL, FREET4, T3FREE, THYROIDAB in the last 72 hours. Anemia Panel: No results for input(s): VITAMINB12, FOLATE, FERRITIN, TIBC, IRON, RETICCTPCT in the last 72 hours. Urine analysis:    Component Value Date/Time   COLORURINE YELLOW 03/09/2017 0801   APPEARANCEUR CLEAR 03/09/2017 0801   LABSPEC 1.013 03/09/2017 0801   PHURINE 5.0 03/09/2017 0801   GLUCOSEU 50 (A) 03/09/2017 0801   HGBUR NEGATIVE 03/09/2017 0801   BILIRUBINUR NEGATIVE 03/09/2017 0801   KETONESUR NEGATIVE 03/09/2017 0801   PROTEINUR 30 (A) 03/09/2017 0801   UROBILINOGEN 0.2  02/08/2015 1615   NITRITE NEGATIVE 03/09/2017 0801   LEUKOCYTESUR NEGATIVE 03/09/2017 0801   Sepsis Labs: @LABRCNTIP (procalcitonin:4,lacticidven:4) )No results found for this or any previous visit (from the past 240 hour(s)).   Radiological Exams on Admission: No results found.  EKG: Ordered and pending.   Assessment/Plan  1. Symptomatic anemia; GI blood-loss  - Presents with fatigue and Hgb 8.4, down from 10.8  one week earlier, and from 13.4 in late January 2019  - She reports recent melena, FOBT+ on admission, BUN increased to 72  - She is hemodynamically stable without abd pain or vomiting  - Extensive GI-workup last year with internal hemorrhoids and single transverse colon polyp on colonoscopy, mild gastritis on EGD, no mucosal abnormality on capsule study  - 2 units RBC's ordered from ED for transfusion  - Continue IV PPI q12h, check CBC post-transfusion   2. CKD stage IV; hyperkalemia    - SCr is 2.65 on admission, slightly up from recent priors  - Serum potassium is 5.5  - Continue cardiac monitoring, trend potassium, renally-dose medications, repeat chem panel in am    3. Insulin-dependent DM  - A1c was 7.3% in January 2019  - Managed at home with Toujeo 24 units qHS  - Follow CBG's and start Levemir 15 units qHS with Novolog per sliding-scale while in hospital    4. Hypertension  - BP at goal  - Continue Lopressor and Norvasc   5. Hypothyroidism  - Continue Synthroid    DVT prophylaxis: SCD's  Code Status: Full  Family Communication: Significant other updated at bedside  Consults called: None Admission status: Observation     Vianne Bulls, MD [facility] Pager [phone]  If 7PM-7AM, please contact night-coverage [email or web address] Password [facility]  07/30/2017, 7:54 PM

## 2017-07-31 ENCOUNTER — Other Ambulatory Visit: Payer: Self-pay

## 2017-07-31 DIAGNOSIS — K922 Gastrointestinal hemorrhage, unspecified: Secondary | ICD-10-CM | POA: Diagnosis not present

## 2017-07-31 DIAGNOSIS — E1122 Type 2 diabetes mellitus with diabetic chronic kidney disease: Secondary | ICD-10-CM | POA: Diagnosis not present

## 2017-07-31 DIAGNOSIS — N184 Chronic kidney disease, stage 4 (severe): Secondary | ICD-10-CM | POA: Diagnosis not present

## 2017-07-31 DIAGNOSIS — D649 Anemia, unspecified: Secondary | ICD-10-CM | POA: Diagnosis not present

## 2017-07-31 DIAGNOSIS — E039 Hypothyroidism, unspecified: Secondary | ICD-10-CM | POA: Diagnosis not present

## 2017-07-31 DIAGNOSIS — I10 Essential (primary) hypertension: Secondary | ICD-10-CM

## 2017-07-31 LAB — BASIC METABOLIC PANEL
Anion gap: 9 (ref 5–15)
BUN: 69 mg/dL — AB (ref 6–20)
CO2: 23 mmol/L (ref 22–32)
CREATININE: 2.51 mg/dL — AB (ref 0.44–1.00)
Calcium: 8.9 mg/dL (ref 8.9–10.3)
Chloride: 111 mmol/L (ref 101–111)
GFR calc Af Amer: 20 mL/min — ABNORMAL LOW (ref >60)
GFR, EST NON AFRICAN AMERICAN: 17 mL/min — AB (ref >60)
Glucose, Bld: 86 mg/dL (ref 65–99)
POTASSIUM: 4.9 mmol/L (ref 3.5–5.1)
SODIUM: 143 mmol/L (ref 135–145)

## 2017-07-31 LAB — CBC
HCT: 29.6 % — ABNORMAL LOW (ref 36.0–46.0)
Hemoglobin: 9.6 g/dL — ABNORMAL LOW (ref 12.0–15.0)
MCH: 29 pg (ref 26.0–34.0)
MCHC: 32.4 g/dL (ref 30.0–36.0)
MCV: 89.4 fL (ref 78.0–100.0)
PLATELETS: 270 10*3/uL (ref 150–400)
RBC: 3.31 MIL/uL — AB (ref 3.87–5.11)
RDW: 15.6 % — AB (ref 11.5–15.5)
WBC: 5.9 10*3/uL (ref 4.0–10.5)

## 2017-07-31 LAB — GLUCOSE, CAPILLARY
GLUCOSE-CAPILLARY: 78 mg/dL (ref 65–99)
GLUCOSE-CAPILLARY: 74 mg/dL (ref 65–99)
Glucose-Capillary: 116 mg/dL — ABNORMAL HIGH (ref 65–99)

## 2017-07-31 LAB — POTASSIUM: POTASSIUM: 5 mmol/L (ref 3.5–5.1)

## 2017-07-31 MED ORDER — PANTOPRAZOLE SODIUM 40 MG PO TBEC: 40.0000 mg | DELAYED_RELEASE_TABLET | Freq: Two times a day (BID) | ORAL | 1 refills | Status: DC

## 2017-07-31 MED ORDER — ACETAMINOPHEN 325 MG PO TABS: 650.0000 mg | ORAL_TABLET | Freq: Four times a day (QID) | ORAL | 0 refills | Status: DC | PRN

## 2017-07-31 NOTE — Progress Notes (Signed)
Patient discharged home with personal belongings. IVs removed and sites intact. Patient discharged with prescriptions as well.

## 2017-07-31 NOTE — Discharge Summary (Signed)
Physician Discharge Summary  [name] ZOX:[id number] DOB: [birth year] DOA: 07/30/2017  PCP: Caren Macadam, MD  Admit date: 07/30/2017 Discharge date: 07/31/2017  Time spent: 35 minutes  Recommendations for Outpatient Follow-up:  1. Repeat CBC to follow hemoglobin trend 2. Repeat basic metabolic panel to follow electrolytes and renal function 3. Follow patient's CBGs and further adjust her hypoglycemic regimen as needed.   Discharge Diagnoses:  Principal Problem:   Symptomatic anemia Active Problems:   Diabetes mellitus with stage 4 chronic kidney disease (HCC)   HTN (hypertension)   Chronic kidney disease, stage IV (severe) (HCC)   Essential hypertension, benign   Hypothyroidism   GI bleed   Hyperkalemia   Discharge Condition: Stable and improved.  Patient discharged home with instructions to follow-up with PCP in 1 week and to follow-up with her gastroenterologist Dr. feels as an outpatient.  Diet recommendation: Heart healthy and modified carbohydrates.  Filed Weights   07/30/17 1446 07/30/17 2204  Weight: 78.9 kg (174 lb) 75.6 kg (166 lb 10.7 oz)    History of present illness:  As per H&P written by Dr. Myna Hidalgo on 07/30/17 77 y.o. female with medical history significant for the chronic kidney disease stage IV, insulin-dependent diabetes mellitus, hypothyroidism, hypertension, now presenting to the emergency department for evaluation of fatigue, melena, and low hemoglobin on outpatient blood work.  Patient reports developing melena approximately 1 week ago, had outpatient blood work performed revealing a significant drop in hemoglobin, the ED for further evaluation.  She denies abdominal pain, nausea, or vomiting.  She denies bright red blood per rectum.  She is no longer taking an antiplatelet.  She had extensive GI workup performed last year with the etiology of blood loss not identified.  Hemoglobin had been 13.4 on January 24, down to 11.6 a week ago, and then 9 on recent  CBC.  ED Course: Upon arrival to the ED, patient is found to be afebrile, saturating well on room air, and with vitals otherwise normal.  Chemistry panel reveals a potassium of 5.5, BUN of 72, and creatinine of 2.65, slightly up from recent priors.  CBC is notable for hemoglobin of 8.4, down from 11.6 a week ago, and down from 13.4 in late January.  Fecal occult blood testing is positive.  2 units of packed red blood cells were ordered for immediate transfusion by the ED physician, patient remained hemodynamically stable, and she will be observed on the telemetry unit for ongoing evaluation and management of melena with symptomatic anemia.   Hospital Course:  1-symptomatic anemia: In a patient with acute on chronic GI bleed. -None identified source of bleeding until this moment. -Extensive GI workup last year, including endoscopy, colonoscopy and capsule study -Patient received 2 units of PRBCs with good response and a stable hemoglobin after transfusion (hemoglobin 9.6). -Patient asymptomatic and requesting to go home. -GI was curbside about patient's situation and need for further immediate intervention while inpatient and recommendations was given for outpatient follow-up and further decision on workup and treatment. -She is advised to stop the use of NSAIDs and her PPI was changed to twice a day.  2-chronic kidney disease stage IV -Serum creatinine back to her baseline around 2.5 -Electrolytes are stable and within normal limits -No abnormalities seen on telemetry.  3-insulin-dependent diabetes with nephropathy-last hemoglobin A1c in January 2019 7.3 -Will resume home hypoglycemic regimen and the patient has been advised to follow modified carbohydrate diet. -Further adjustment to her hypoglycemic regimen as per PCP recommendations in the  outpatient setting.  4-hypertension -Stable and at goal -Continue home antihypertensive regimen -Patient advised to follow low-sodium  diet.  5-hypothyroidism -Continue Synthroid  6-history of gout: -No acute flare appreciated -Continue the use of Uloric  Procedures:  None   Consultations:  GI curbside (Dr. Gala Romney, recommended outpatient follow up and expressed ok to discharge patient home given her stable condition and response to transfusion)  Discharge Exam: Vitals:   07/31/17 0526 07/31/17 1332  BP: (!) 118/40 (!) 121/42  Pulse: 60 62  Resp: 18 18  Temp: 98.4 F (36.9 C) 97.8 F (36.6 C)  SpO2: 97% 98%    General: Afebrile, in no acute distress; patient denies chest pain, shortness of breath, nausea, vomiting, abdominal pain or any other complaints. Cardiovascular: S1 and S2, no rubs, no gallops, no JVD. Respiratory: Clear to auscultation bilaterally, normal respiratory effort, good oxygen saturation on room air. Abdomen: Soft, nontender, nondistended, positive bowel sounds Extremities: No edema, no cyanosis, no clubbing Neurologic exam: Alert, awake and oriented x3; cranial nerve grossly intact, no focal neurologic deficit.  Discharge Instructions   Discharge Instructions    Diet - low sodium heart healthy   Complete by:  As directed    Discharge instructions   Complete by:  As directed    -Avoid the use of NSAIDs -Maintain adequate hydration -Arrange follow-up with PCP in 10 days -Follow-up with gastroenterologist as instructed (Dr. fields office will contact you with appointment details) -Pay close attention to your bowel movement for any signs of further overt bleeding.     Allergies as of 07/31/2017      Reactions   Insulin Glargine Swelling   "Makes me swell like a balloon all over", including face, but without any respiratory distress or rashes. Associated with weight gain.   Statins Other (See Comments)   "I've tried them all; my muscle aches were so bad I couldn't walk".      Medication List    STOP taking these medications   fenofibrate 54 MG tablet     TAKE these  medications   acetaminophen 325 MG tablet Commonly known as:  TYLENOL Take 2 tablets (650 mg total) by mouth every 6 (six) hours as needed for mild pain or headache (or Fever >/= 101).   amLODipine 10 MG tablet Commonly known as:  NORVASC Take 1 tablet (10 mg total) by mouth daily.   calcitRIOL 0.25 MCG capsule Commonly known as:  ROCALTROL Take 1 capsule (0.25 mcg total) by mouth daily.   docusate sodium 100 MG capsule Commonly known as:  COLACE Take 1 capsule (100 mg total) by mouth every 12 (twelve) hours. What changed:  when to take this   febuxostat 40 MG tablet Commonly known as:  ULORIC Take 1 tablet (40 mg total) by mouth daily.   furosemide 20 MG tablet Commonly known as:  LASIX Take 20 mg by mouth as needed. Only has to take it when she gains 2 lbs   gabapentin 300 MG capsule Commonly known as:  NEURONTIN Take 2 capsules (600 mg total) by mouth at bedtime.   hydrALAZINE 25 MG tablet Commonly known as:  APRESOLINE TAKE 1 TABLET BY MOUTH EVERY 8 HOURS AS NEEDED   Insulin Glargine 300 UNIT/ML Sopn Commonly known as:  TOUJEO MAX SOLOSTAR Inject 20 Units at bedtime into the skin. What changed:  how much to take   levothyroxine 50 MCG tablet Commonly known as:  SYNTHROID, LEVOTHROID TAKE 1 TABLET BY MOUTH ONCE DAILY BEFORE  BREAKFAST  metoprolol tartrate 25 MG tablet Commonly known as:  LOPRESSOR Take 12.5 mg by mouth 2 (two) times daily.   multivitamin with minerals Tabs tablet Take 1 tablet by mouth daily.   ondansetron 4 MG tablet Commonly known as:  ZOFRAN Take 1 tablet (4 mg total) by mouth every 8 (eight) hours as needed for nausea or vomiting.   pantoprazole 40 MG tablet Commonly known as:  PROTONIX Take 1 tablet (40 mg total) by mouth 2 (two) times daily. 30 minutes before breakfast What changed:  when to take this      Allergies  Allergen Reactions  . Insulin Glargine Swelling    "Makes me swell like a balloon all over", including face,  but without any respiratory distress or rashes. Associated with weight gain.  . Statins Other (See Comments)    "I've tried them all; my muscle aches were so bad I couldn't walk".   Follow-up Information    Caren Macadam, MD. Schedule an appointment as soon as possible for a visit in 10 day(s).   Specialty:  Family Medicine Contact information: [place] [address] [phone]        Danie Binder, MD Follow up.   Specialty:  Gastroenterology Why:  office will contact you with appointment details  Contact information: [address] [place] [address] [phone]           The results of significant diagnostics from this hospitalization (including imaging, microbiology, ancillary and laboratory) are listed below for reference.    Labs: Basic Metabolic Panel: Recent Labs  Lab 07/30/17 1601 07/31/17 0017 07/31/17 0508  NA 141  --  143  K 5.5* 5.0 4.9  CL 109  --  111  CO2 21*  --  23  GLUCOSE 220*  --  86  BUN 72*  --  69*  CREATININE 2.65*  --  2.51*  CALCIUM 9.5  --  8.9   CBC: Recent Labs  Lab 07/25/17 1014 07/29/17 1459 07/30/17 1601 07/31/17 0508  WBC 6.2 6.5 6.5 5.9  NEUTROABS 4.2 4.4  --   --   HGB 10.8* 9.0* 8.4* 9.6*  HCT 33.1* 27.5* 25.6* 29.6*  MCV 91.4 92.0 93.1 89.4  PLT 312 312 347 270    BNP (last 3 results) Recent Labs    03/08/17 2321  BNP 1,202.0*   CBG: Recent Labs  Lab 07/30/17 2220 07/31/17 0734 07/31/17 1131 07/31/17 1616  GLUCAP 128* 78 116* 74    Signed:  Barton Dubois MD.  [facility] 07/31/2017, 5:07 PM

## 2017-07-31 NOTE — Care Management Obs Status (Signed)
[place] NOTIFICATION   Patient Details  Name: [name] MRN: [id number] Date of Birth: 07-19-1940   Medicare Observation Status Notification Given:  Yes    Sherald Barge, RN 07/31/2017, 11:19 AM

## 2017-08-01 ENCOUNTER — Other Ambulatory Visit (HOSPITAL_COMMUNITY): Payer: Self-pay

## 2017-08-01 DIAGNOSIS — N184 Chronic kidney disease, stage 4 (severe): Secondary | ICD-10-CM

## 2017-08-01 LAB — TYPE AND SCREEN
ABO/RH(D): B POS
Antibody Screen: NEGATIVE
UNIT DIVISION: 0
Unit division: 0

## 2017-08-01 LAB — BPAM RBC
Blood Product Expiration Date: 201904022359
Blood Product Expiration Date: 201904052359
ISSUE DATE / TIME: 201903051959
ISSUE DATE / TIME: 201903060003
Unit Type and Rh: 1700
Unit Type and Rh: 1700

## 2017-08-02 ENCOUNTER — Inpatient Hospital Stay (HOSPITAL_COMMUNITY): Payer: Medicare HMO | Attending: Internal Medicine | Admitting: Internal Medicine

## 2017-08-02 ENCOUNTER — Inpatient Hospital Stay (HOSPITAL_COMMUNITY): Payer: Medicare HMO

## 2017-08-02 ENCOUNTER — Encounter (HOSPITAL_COMMUNITY): Payer: Self-pay | Admitting: Internal Medicine

## 2017-08-02 VITALS — BP 155/78 | HR 74 | Temp 97.7°F | Resp 18 | Wt 174.3 lb

## 2017-08-02 DIAGNOSIS — M109 Gout, unspecified: Secondary | ICD-10-CM | POA: Insufficient documentation

## 2017-08-02 DIAGNOSIS — E785 Hyperlipidemia, unspecified: Secondary | ICD-10-CM | POA: Diagnosis not present

## 2017-08-02 DIAGNOSIS — E875 Hyperkalemia: Secondary | ICD-10-CM | POA: Insufficient documentation

## 2017-08-02 DIAGNOSIS — Z794 Long term (current) use of insulin: Secondary | ICD-10-CM | POA: Insufficient documentation

## 2017-08-02 DIAGNOSIS — I130 Hypertensive heart and chronic kidney disease with heart failure and stage 1 through stage 4 chronic kidney disease, or unspecified chronic kidney disease: Secondary | ICD-10-CM | POA: Insufficient documentation

## 2017-08-02 DIAGNOSIS — Z79899 Other long term (current) drug therapy: Secondary | ICD-10-CM | POA: Insufficient documentation

## 2017-08-02 DIAGNOSIS — D500 Iron deficiency anemia secondary to blood loss (chronic): Secondary | ICD-10-CM

## 2017-08-02 DIAGNOSIS — K219 Gastro-esophageal reflux disease without esophagitis: Secondary | ICD-10-CM | POA: Insufficient documentation

## 2017-08-02 DIAGNOSIS — K922 Gastrointestinal hemorrhage, unspecified: Secondary | ICD-10-CM | POA: Diagnosis not present

## 2017-08-02 DIAGNOSIS — Z85820 Personal history of malignant melanoma of skin: Secondary | ICD-10-CM | POA: Diagnosis not present

## 2017-08-02 DIAGNOSIS — I5042 Chronic combined systolic (congestive) and diastolic (congestive) heart failure: Secondary | ICD-10-CM | POA: Insufficient documentation

## 2017-08-02 DIAGNOSIS — N184 Chronic kidney disease, stage 4 (severe): Secondary | ICD-10-CM | POA: Diagnosis not present

## 2017-08-02 DIAGNOSIS — E1122 Type 2 diabetes mellitus with diabetic chronic kidney disease: Secondary | ICD-10-CM | POA: Diagnosis not present

## 2017-08-02 DIAGNOSIS — I252 Old myocardial infarction: Secondary | ICD-10-CM | POA: Diagnosis not present

## 2017-08-02 DIAGNOSIS — D649 Anemia, unspecified: Secondary | ICD-10-CM

## 2017-08-02 DIAGNOSIS — E039 Hypothyroidism, unspecified: Secondary | ICD-10-CM | POA: Diagnosis not present

## 2017-08-02 LAB — CBC WITH DIFFERENTIAL/PLATELET
Basophils Absolute: 0 10*3/uL (ref 0.0–0.1)
Basophils Relative: 1 %
Eosinophils Absolute: 0.3 10*3/uL (ref 0.0–0.7)
Eosinophils Relative: 6 %
HEMATOCRIT: 28.6 % — AB (ref 36.0–46.0)
HEMOGLOBIN: 9.3 g/dL — AB (ref 12.0–15.0)
LYMPHS ABS: 1 10*3/uL (ref 0.7–4.0)
LYMPHS PCT: 18 %
MCH: 29.9 pg (ref 26.0–34.0)
MCHC: 32.5 g/dL (ref 30.0–36.0)
MCV: 92 fL (ref 78.0–100.0)
MONOS PCT: 7 %
Monocytes Absolute: 0.4 10*3/uL (ref 0.1–1.0)
NEUTROS ABS: 3.8 10*3/uL (ref 1.7–7.7)
NEUTROS PCT: 68 %
Platelets: 293 10*3/uL (ref 150–400)
RBC: 3.11 MIL/uL — ABNORMAL LOW (ref 3.87–5.11)
RDW: 15.6 % — ABNORMAL HIGH (ref 11.5–15.5)
WBC: 5.5 10*3/uL (ref 4.0–10.5)

## 2017-08-02 LAB — COMPREHENSIVE METABOLIC PANEL
ALT: 14 U/L (ref 14–54)
ANION GAP: 9 (ref 5–15)
AST: 17 U/L (ref 15–41)
Albumin: 3.5 g/dL (ref 3.5–5.0)
Alkaline Phosphatase: 54 U/L (ref 38–126)
BILIRUBIN TOTAL: 0.5 mg/dL (ref 0.3–1.2)
BUN: 70 mg/dL — ABNORMAL HIGH (ref 6–20)
CALCIUM: 9.2 mg/dL (ref 8.9–10.3)
CO2: 24 mmol/L (ref 22–32)
CREATININE: 2.5 mg/dL — AB (ref 0.44–1.00)
Chloride: 107 mmol/L (ref 101–111)
GFR calc non Af Amer: 17 mL/min — ABNORMAL LOW (ref >60)
GFR, EST AFRICAN AMERICAN: 20 mL/min — AB (ref >60)
GLUCOSE: 176 mg/dL — AB (ref 65–99)
Potassium: 5.5 mmol/L — ABNORMAL HIGH (ref 3.5–5.1)
Sodium: 140 mmol/L (ref 135–145)
TOTAL PROTEIN: 6.4 g/dL — AB (ref 6.5–8.1)

## 2017-08-02 MED ORDER — DARBEPOETIN ALFA 200 MCG/0.4ML IJ SOSY
200.0000 ug | PREFILLED_SYRINGE | Freq: Once | INTRAMUSCULAR | Status: AC
  Administered 2017-08-02: 200 ug via SUBCUTANEOUS
  Filled 2017-08-02: qty 0.4

## 2017-08-02 NOTE — Patient Instructions (Addendum)
[place] at [facility] Discharge Instructions   You were seen today by Dr. Zoila Shutter. She discussed your kidney function and how your condition is going. We will give you your injection here today. We will continue checking your blood work monthly. We will continue monthly injections as well. Return in 4 months for labs and follow up.     Thank you for choosing [place] at [facility] to provide your oncology and hematology care.  To afford each patient quality time with our provider, please arrive at least 15 minutes before your scheduled appointment time.    If you have a lab appointment with the [facility] please come in thru the  Main Entrance and check in at the main information desk  You need to re-schedule your appointment should you arrive 10 or more minutes late.  We strive to give you quality time with our providers, and arriving late affects you and other patients whose appointments are after yours.  Also, if you no show three or more times for appointments you may be dismissed from the clinic at the providers discretion.     Again, thank you for choosing [facility].  Our hope is that these requests will decrease the amount of time that you wait before being seen by our physicians.       _____________________________________________________________  Should you have questions after your visit to [facility], please contact our office at [phone] between the hours of 8:30 a.m. and 4:30 p.m.  Voicemails left after 4:30 p.m. will not be returned until the following business day.  For prescription refill requests, have your pharmacy contact our office.       Resources For Cancer Patients and their Caregivers ? American Cancer Society: Can assist with transportation, wigs, general needs, runs Look Good Feel Better.        956 576 2796 ? Cancer Care: Provides financial assistance, online  support groups, medication/co-pay assistance.  1-800-813-HOPE 331-013-8401) ? [place] Assists [place] cancer patients and their families through emotional , educational and financial support.  213 430 5396 ? [place] DSS Where to apply for food stamps, Medicaid and utility assistance. 303-739-0714 ? [facility]: Transportation to medical appointments. 856-172-8533 ? Social Security Administration: May apply for disability if have a Stage IV cancer. 806-648-2219 929-437-6205 ? [employer], Disability and Transit Services: Assists with nutrition, care and transit needs. [employer] Support Programs:   > Cancer Support Group  2nd Tuesday of the month 1pm-2pm, Journey Room   > Creative Journey  3rd Tuesday of the month 1130am-1pm, Journey Room

## 2017-08-02 NOTE — Progress Notes (Signed)
Oncology follow up today.  Ok to treat with aranesp verbal order Dr. Walden Field.   Patient tolerated aranesp shot with no complaints voiced.  Injection site clean and dry with no bruising or swelling noted at site.  VSS with discharge and left ambulatory with caregiver.  No s/s of distress noted.

## 2017-08-05 ENCOUNTER — Encounter: Payer: Self-pay | Admitting: Orthopedic Surgery

## 2017-08-05 ENCOUNTER — Telehealth: Payer: Self-pay | Admitting: Family Medicine

## 2017-08-05 NOTE — Telephone Encounter (Signed)
ERROR

## 2017-08-06 ENCOUNTER — Encounter: Payer: Self-pay | Admitting: Family Medicine

## 2017-08-06 ENCOUNTER — Other Ambulatory Visit (HOSPITAL_COMMUNITY)
Admission: RE | Admit: 2017-08-06 | Discharge: 2017-08-06 | Disposition: A | Discharge status: Home / Self Care | Payer: Medicare HMO | Source: Ambulatory Visit | Attending: *Deleted | Admitting: *Deleted

## 2017-08-06 ENCOUNTER — Ambulatory Visit (INDEPENDENT_AMBULATORY_CARE_PROVIDER_SITE_OTHER): Payer: Medicare HMO | Admitting: Family Medicine

## 2017-08-06 VITALS — BP 132/60 | HR 76 | Temp 98.2°F | Ht 64.0 in | Wt 173.0 lb

## 2017-08-06 DIAGNOSIS — Z79890 Hormone replacement therapy (postmenopausal): Secondary | ICD-10-CM | POA: Diagnosis not present

## 2017-08-06 DIAGNOSIS — N184 Chronic kidney disease, stage 4 (severe): Secondary | ICD-10-CM | POA: Diagnosis not present

## 2017-08-06 DIAGNOSIS — E785 Hyperlipidemia, unspecified: Secondary | ICD-10-CM | POA: Diagnosis present

## 2017-08-06 DIAGNOSIS — I2510 Atherosclerotic heart disease of native coronary artery without angina pectoris: Secondary | ICD-10-CM | POA: Diagnosis present

## 2017-08-06 DIAGNOSIS — M109 Gout, unspecified: Secondary | ICD-10-CM | POA: Diagnosis present

## 2017-08-06 DIAGNOSIS — R5383 Other fatigue: Secondary | ICD-10-CM | POA: Diagnosis not present

## 2017-08-06 DIAGNOSIS — D631 Anemia in chronic kidney disease: Secondary | ICD-10-CM | POA: Diagnosis present

## 2017-08-06 DIAGNOSIS — I132 Hypertensive heart and chronic kidney disease with heart failure and with stage 5 chronic kidney disease, or end stage renal disease: Secondary | ICD-10-CM | POA: Diagnosis present

## 2017-08-06 DIAGNOSIS — Z888 Allergy status to other drugs, medicaments and biological substances status: Secondary | ICD-10-CM | POA: Diagnosis not present

## 2017-08-06 DIAGNOSIS — K219 Gastro-esophageal reflux disease without esophagitis: Secondary | ICD-10-CM | POA: Diagnosis present

## 2017-08-06 DIAGNOSIS — K2951 Unspecified chronic gastritis with bleeding: Secondary | ICD-10-CM | POA: Diagnosis present

## 2017-08-06 DIAGNOSIS — D649 Anemia, unspecified: Secondary | ICD-10-CM | POA: Diagnosis not present

## 2017-08-06 DIAGNOSIS — R42 Dizziness and giddiness: Secondary | ICD-10-CM | POA: Diagnosis not present

## 2017-08-06 DIAGNOSIS — I5042 Chronic combined systolic (congestive) and diastolic (congestive) heart failure: Secondary | ICD-10-CM | POA: Diagnosis present

## 2017-08-06 DIAGNOSIS — E1122 Type 2 diabetes mellitus with diabetic chronic kidney disease: Secondary | ICD-10-CM | POA: Diagnosis not present

## 2017-08-06 DIAGNOSIS — I10 Essential (primary) hypertension: Secondary | ICD-10-CM | POA: Diagnosis not present

## 2017-08-06 DIAGNOSIS — I252 Old myocardial infarction: Secondary | ICD-10-CM | POA: Diagnosis not present

## 2017-08-06 DIAGNOSIS — D500 Iron deficiency anemia secondary to blood loss (chronic): Secondary | ICD-10-CM | POA: Diagnosis not present

## 2017-08-06 DIAGNOSIS — R531 Weakness: Secondary | ICD-10-CM | POA: Diagnosis not present

## 2017-08-06 DIAGNOSIS — K921 Melena: Secondary | ICD-10-CM | POA: Diagnosis not present

## 2017-08-06 DIAGNOSIS — Z794 Long term (current) use of insulin: Secondary | ICD-10-CM | POA: Diagnosis not present

## 2017-08-06 DIAGNOSIS — K319 Disease of stomach and duodenum, unspecified: Secondary | ICD-10-CM | POA: Diagnosis not present

## 2017-08-06 DIAGNOSIS — K922 Gastrointestinal hemorrhage, unspecified: Secondary | ICD-10-CM | POA: Diagnosis not present

## 2017-08-06 DIAGNOSIS — E1169 Type 2 diabetes mellitus with other specified complication: Secondary | ICD-10-CM | POA: Diagnosis present

## 2017-08-06 DIAGNOSIS — Z79899 Other long term (current) drug therapy: Secondary | ICD-10-CM | POA: Diagnosis not present

## 2017-08-06 DIAGNOSIS — K449 Diaphragmatic hernia without obstruction or gangrene: Secondary | ICD-10-CM | POA: Diagnosis present

## 2017-08-06 DIAGNOSIS — Z96642 Presence of left artificial hip joint: Secondary | ICD-10-CM | POA: Diagnosis present

## 2017-08-06 DIAGNOSIS — Z833 Family history of diabetes mellitus: Secondary | ICD-10-CM | POA: Diagnosis not present

## 2017-08-06 DIAGNOSIS — E039 Hypothyroidism, unspecified: Secondary | ICD-10-CM | POA: Diagnosis not present

## 2017-08-06 DIAGNOSIS — N185 Chronic kidney disease, stage 5: Secondary | ICD-10-CM | POA: Diagnosis present

## 2017-08-06 LAB — CBC WITH DIFFERENTIAL/PLATELET
BASOS ABS: 0.1 10*3/uL (ref 0.0–0.1)
BASOS PCT: 1 %
EOS ABS: 0.4 10*3/uL (ref 0.0–0.7)
EOS PCT: 6 %
HCT: 26.6 % — ABNORMAL LOW (ref 36.0–46.0)
Hemoglobin: 8.4 g/dL — ABNORMAL LOW (ref 12.0–15.0)
Lymphocytes Relative: 21 %
Lymphs Abs: 1.4 10*3/uL (ref 0.7–4.0)
MCH: 29.8 pg (ref 26.0–34.0)
MCHC: 31.6 g/dL (ref 30.0–36.0)
MCV: 94.3 fL (ref 78.0–100.0)
Monocytes Absolute: 0.3 10*3/uL (ref 0.1–1.0)
Monocytes Relative: 5 %
NEUTROS PCT: 67 %
Neutro Abs: 4.6 10*3/uL (ref 1.7–7.7)
PLATELETS: 323 10*3/uL (ref 150–400)
RBC: 2.82 MIL/uL — AB (ref 3.87–5.11)
RDW: 16.6 % — ABNORMAL HIGH (ref 11.5–15.5)
WBC: 6.8 10*3/uL (ref 4.0–10.5)

## 2017-08-06 NOTE — Progress Notes (Signed)
Chief Complaint  Patient presents with  . Follow-up    hospital last week low hemoglobin    Patient was hospitalized last week with another GI bleed causing her to have a low hemoglobin, requiring transfusions.  She was hospitalized on 07/31/17 to 08/02/17.  Her hemoglobin when she was discharged was 9.3 after 2 units of packed red cells.  She has a follow-up scheduled with gastroenterology on 08/08/17. She tells me that she is continuing to have black stools, she is feeling tired, and she is pretty sure that her hemoglobin is down again. She expresses frustration that no one can identify what is causing her GI bleed to stop it.  The cycle of GI bleeding, hospitalization, transfusions, home and then another GI bleed is worrisome and tedious for her.  She actually stated, " one  of these days I am going to die". 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  Her GFR is 17.  She knows that when she was under 14 she is going to need dialysis.  Her significant other, [name], has done a lot of research regarding the kidney failure.  He wants her to have a transplant consultation now so that she does not need dialysis.  Both of her sons a volunteer to be donors.  He would like him tested.  He wants preplanning done so that if her kidney function fails, they are ready to take care of her.  I did reassure them that if her kidney function diminishes, dialysis until transplantation can be accomplished as the usual transition.  She is under the care of nephrology.  I encouraged them to discuss this with him. She has diabetes mellitus that is traditionally well controlled.  Her blood pressure is well controlled.  Patient Active Problem List   Diagnosis Date Noted  . GI bleed 07/30/2017  . Hyperkalemia 07/30/2017  . Essential hypertension, benign 12/13/2016  . Hypothyroidism 12/13/2016  . Iron deficiency anemia 11/08/2016  . Aortic atherosclerosis ([place]) 10/25/2016  . Symptomatic anemia 10/04/2016  . History of  non-ST elevation myocardial infarction (NSTEMI) 10/04/2016  . Neuropathy 09/20/2016  . Chronic kidney disease, stage IV (severe) ([place]) 08/03/2015  . Hip fracture ([place]) 02/05/2015  . Diabetes mellitus with stage 4 chronic kidney disease ([place]) 02/05/2015  . HTN (hypertension) 02/05/2015  . Gout 02/05/2015  . Left humeral fracture 02/05/2015  . Hyperlipidemia associated with type 2 diabetes mellitus ([place]) 05/01/2013    Outpatient Encounter Medications as of 08/06/2017  Medication Sig  . acetaminophen (TYLENOL) 325 MG tablet Take 2 tablets (650 mg total) by mouth every 6 (six) hours as needed for mild pain or headache (or Fever >/= 101).  Marland Kitchen amLODipine (NORVASC) 10 MG tablet Take 1 tablet (10 mg total) by mouth daily.  Marland Kitchen docusate sodium (COLACE) 100 MG capsule Take 1 capsule (100 mg total) by mouth every 12 (twelve) hours. (Patient taking differently: Take 100 mg by mouth daily. )  . furosemide (LASIX) 20 MG tablet Take 20 mg by mouth as needed. Only has to take it when she gains 2 lbs  . gabapentin (NEURONTIN) 300 MG capsule Take 2 capsules (600 mg total) by mouth at bedtime.  . hydrALAZINE (APRESOLINE) 25 MG tablet TAKE 1 TABLET BY MOUTH EVERY 8 HOURS AS NEEDED  . Insulin Glargine (TOUJEO MAX SOLOSTAR) 300 UNIT/ML SOPN Inject 20 Units at bedtime into the skin. (Patient taking differently: Inject 24 Units into the skin at bedtime. )  . levothyroxine (SYNTHROID, LEVOTHROID) 50 MCG tablet TAKE  1 TABLET BY MOUTH ONCE DAILY BEFORE  BREAKFAST  . metoprolol tartrate (LOPRESSOR) 25 MG tablet Take 12.5 mg by mouth 2 (two) times daily.  . Multiple Vitamin (MULTIVITAMIN WITH MINERALS) TABS tablet Take 1 tablet by mouth daily.  . ondansetron (ZOFRAN) 4 MG tablet Take 1 tablet (4 mg total) by mouth every 8 (eight) hours as needed for nausea or vomiting.  . pantoprazole (PROTONIX) 40 MG tablet Take 1 tablet (40 mg total) by mouth 2 (two) times daily. 30 minutes before breakfast   No facility-administered  encounter medications on file as of 08/06/2017.     Allergies  Allergen Reactions  . Insulin Glargine Swelling    "Makes me swell like a balloon all over", including face, but without any respiratory distress or rashes. Associated with weight gain.  . Statins Other (See Comments)    "I've tried them all; my muscle aches were so bad I couldn't walk".    Review of Systems  Constitutional: Positive for fatigue. Negative for activity change, appetite change and unexpected weight change.  HENT: Negative for congestion, dental problem, postnasal drip and rhinorrhea.   Eyes: Negative for redness and visual disturbance.  Respiratory: Negative for cough and shortness of breath.   Cardiovascular: Negative for chest pain, palpitations and leg swelling.  Gastrointestinal: Positive for diarrhea. Negative for abdominal pain and constipation.       Loose black stool  Genitourinary: Negative for difficulty urinating and frequency.  Musculoskeletal: Negative for arthralgias and back pain.  Skin: Positive for pallor.  Neurological: Positive for weakness. Negative for dizziness and headaches.       Generally feels weak  Psychiatric/Behavioral: Negative for dysphoric mood and sleep disturbance. The patient is not nervous/anxious.     BP 132/60 (BP Location: Right Arm, Patient Position: Sitting, Cuff Size: Normal)   Pulse 76   Temp 98.2 F (36.8 C) (Temporal)   Ht 5\' 4"  (1.626 m)   Wt 173 lb (78.5 kg)   SpO2 96%   BMI 29.70 kg/m   Physical Exam  Constitutional: She is oriented to person, place, and time. She appears well-developed and well-nourished. No distress.  Mild fatigue  HENT:  Head: Normocephalic and atraumatic.  Mouth/Throat: Oropharynx is clear and moist.  Eyes: Conjunctivae are normal. Pupils are equal, round, and reactive to light.  Neck: Normal range of motion.  Cardiovascular: Normal rate.  Murmur heard. Pulmonary/Chest: Effort normal and breath sounds normal. She has no rales.   Abdominal: Soft. Bowel sounds are normal.  Musculoskeletal: She exhibits edema.  tr  Lymphadenopathy:    She has no cervical adenopathy.  Neurological: She is alert and oriented to person, place, and time.  Skin: There is pallor.  Psychiatric: She has a normal mood and affect. Her behavior is normal.    ASSESSMENT/PLAN:  1. Anemia due to gastrointestinal blood loss - CBC w/Diff/Platelet  2. Weakness  3.  Chronic renal failure  See HPI discussion  Patient Instructions  Need blood count I will send you a letter with your test results.  If there is anything of concern, we will call right away. See Randall Hiss on Thursday for GI  Need primary care follow up in 3-6 months     [name], MD  Addendum: Results for [name], [name] (MRN [id number]) as of 08/06/2017 17:00  Ref. Range 08/06/2017 16:15  WBC Latest Ref Range: 4.0 - 10.5 K/uL 6.8  RBC Latest Ref Range: 3.87 - 5.11 MIL/uL 2.82 (L)  Hemoglobin Latest Ref Range:  12.0 - 15.0 g/dL 8.4 (L)  HCT Latest Ref Range: 36.0 - 46.0 % 26.6 (L)  MCV Latest Ref Range: 78.0 - 100.0 fL 94.3  MCH Latest Ref Range: 26.0 - 34.0 pg 29.8  MCHC Latest Ref Range: 30.0 - 36.0 g/dL 31.6  RDW Latest Ref Range: 11.5 - 15.5 % 16.6 (H)  Platelets Latest Ref Range: 150 - 400 K/uL 323  Neutrophils Latest Units: % 67  Lymphocytes Latest Units: % 21  Monocytes Relative Latest Units: % 5  Eosinophil Latest Units: % 6  Basophil Latest Units: % 1  NEUT# Latest Ref Range: 1.7 - 7.7 K/uL 4.6  Lymphocyte # Latest Ref Range: 0.7 - 4.0 K/uL 1.4  Monocyte # Latest Ref Range: 0.1 - 1.0 K/uL 0.3  Eosinophils Absolute Latest Ref Range: 0.0 - 0.7 K/uL 0.4  Basophils Absolute Latest Ref Range: 0.0 - 0.1 K/uL 0.1   I called [place] involved with her home to tell them about her test results.  We will call her tomorrow with appropriate follow-up.  She does not want to go to the emergency room for transfusions tonight.

## 2017-08-06 NOTE — Patient Instructions (Addendum)
Need blood count I will send you a letter with your test results.  If there is anything of concern, we will call right away. See Randall Hiss on Thursday for GI  Need primary care follow up in 3-6 months

## 2017-08-07 ENCOUNTER — Encounter (HOSPITAL_COMMUNITY): Payer: Self-pay | Admitting: Emergency Medicine

## 2017-08-07 ENCOUNTER — Other Ambulatory Visit: Payer: Self-pay

## 2017-08-07 ENCOUNTER — Inpatient Hospital Stay (HOSPITAL_COMMUNITY)
Admission: EM | Admit: 2017-08-07 | Discharge: 2017-08-09 | DRG: 378 | Disposition: A | Discharge status: Home / Self Care | Payer: Medicare HMO | Attending: Internal Medicine | Admitting: Internal Medicine

## 2017-08-07 DIAGNOSIS — M109 Gout, unspecified: Secondary | ICD-10-CM | POA: Diagnosis present

## 2017-08-07 DIAGNOSIS — N185 Chronic kidney disease, stage 5: Secondary | ICD-10-CM | POA: Diagnosis present

## 2017-08-07 DIAGNOSIS — Z96642 Presence of left artificial hip joint: Secondary | ICD-10-CM | POA: Diagnosis present

## 2017-08-07 DIAGNOSIS — K921 Melena: Secondary | ICD-10-CM | POA: Diagnosis present

## 2017-08-07 DIAGNOSIS — D631 Anemia in chronic kidney disease: Secondary | ICD-10-CM | POA: Diagnosis not present

## 2017-08-07 DIAGNOSIS — Z888 Allergy status to other drugs, medicaments and biological substances status: Secondary | ICD-10-CM

## 2017-08-07 DIAGNOSIS — K219 Gastro-esophageal reflux disease without esophagitis: Secondary | ICD-10-CM | POA: Diagnosis present

## 2017-08-07 DIAGNOSIS — Z794 Long term (current) use of insulin: Secondary | ICD-10-CM

## 2017-08-07 DIAGNOSIS — I132 Hypertensive heart and chronic kidney disease with heart failure and with stage 5 chronic kidney disease, or end stage renal disease: Secondary | ICD-10-CM | POA: Diagnosis present

## 2017-08-07 DIAGNOSIS — I2510 Atherosclerotic heart disease of native coronary artery without angina pectoris: Secondary | ICD-10-CM | POA: Diagnosis present

## 2017-08-07 DIAGNOSIS — K922 Gastrointestinal hemorrhage, unspecified: Secondary | ICD-10-CM | POA: Diagnosis not present

## 2017-08-07 DIAGNOSIS — E1169 Type 2 diabetes mellitus with other specified complication: Secondary | ICD-10-CM | POA: Diagnosis present

## 2017-08-07 DIAGNOSIS — E039 Hypothyroidism, unspecified: Secondary | ICD-10-CM | POA: Diagnosis present

## 2017-08-07 DIAGNOSIS — D649 Anemia, unspecified: Secondary | ICD-10-CM | POA: Diagnosis present

## 2017-08-07 DIAGNOSIS — Z79899 Other long term (current) drug therapy: Secondary | ICD-10-CM

## 2017-08-07 DIAGNOSIS — K449 Diaphragmatic hernia without obstruction or gangrene: Secondary | ICD-10-CM | POA: Diagnosis present

## 2017-08-07 DIAGNOSIS — N184 Chronic kidney disease, stage 4 (severe): Secondary | ICD-10-CM | POA: Diagnosis not present

## 2017-08-07 DIAGNOSIS — E785 Hyperlipidemia, unspecified: Secondary | ICD-10-CM | POA: Diagnosis present

## 2017-08-07 DIAGNOSIS — Z79890 Hormone replacement therapy (postmenopausal): Secondary | ICD-10-CM

## 2017-08-07 DIAGNOSIS — K2951 Unspecified chronic gastritis with bleeding: Principal | ICD-10-CM | POA: Diagnosis present

## 2017-08-07 DIAGNOSIS — I5042 Chronic combined systolic (congestive) and diastolic (congestive) heart failure: Secondary | ICD-10-CM | POA: Diagnosis present

## 2017-08-07 DIAGNOSIS — I10 Essential (primary) hypertension: Secondary | ICD-10-CM | POA: Diagnosis present

## 2017-08-07 DIAGNOSIS — I252 Old myocardial infarction: Secondary | ICD-10-CM

## 2017-08-07 DIAGNOSIS — Z833 Family history of diabetes mellitus: Secondary | ICD-10-CM

## 2017-08-07 DIAGNOSIS — E1122 Type 2 diabetes mellitus with diabetic chronic kidney disease: Secondary | ICD-10-CM | POA: Diagnosis present

## 2017-08-07 HISTORY — DX: Hypothyroidism, unspecified: E03.9

## 2017-08-07 LAB — BASIC METABOLIC PANEL
Anion gap: 10 (ref 5–15)
BUN: 59 mg/dL — AB (ref 6–20)
CHLORIDE: 104 mmol/L (ref 101–111)
CO2: 22 mmol/L (ref 22–32)
CREATININE: 2.71 mg/dL — AB (ref 0.44–1.00)
Calcium: 8.9 mg/dL (ref 8.9–10.3)
GFR calc Af Amer: 18 mL/min — ABNORMAL LOW (ref >60)
GFR calc non Af Amer: 16 mL/min — ABNORMAL LOW (ref >60)
GLUCOSE: 132 mg/dL — AB (ref 65–99)
POTASSIUM: 4.7 mmol/L (ref 3.5–5.1)
Sodium: 136 mmol/L (ref 135–145)

## 2017-08-07 LAB — GLUCOSE, CAPILLARY
GLUCOSE-CAPILLARY: 141 mg/dL — AB (ref 65–99)
Glucose-Capillary: 129 mg/dL — ABNORMAL HIGH (ref 65–99)
Glucose-Capillary: 127 mg/dL — ABNORMAL HIGH (ref 65–99)
Glucose-Capillary: 117 mg/dL — ABNORMAL HIGH (ref 65–99)

## 2017-08-07 LAB — CBC WITH DIFFERENTIAL/PLATELET
Basophils Absolute: 0 10*3/uL (ref 0.0–0.1)
Basophils Relative: 1 %
EOS ABS: 0.5 10*3/uL (ref 0.0–0.7)
Eosinophils Relative: 6 %
HCT: 24 % — ABNORMAL LOW (ref 36.0–46.0)
HEMOGLOBIN: 7.5 g/dL — AB (ref 12.0–15.0)
LYMPHS PCT: 17 %
Lymphs Abs: 1.3 10*3/uL (ref 0.7–4.0)
MCH: 29.9 pg (ref 26.0–34.0)
MCHC: 31.3 g/dL (ref 30.0–36.0)
MCV: 95.6 fL (ref 78.0–100.0)
MONOS PCT: 7 %
Monocytes Absolute: 0.5 10*3/uL (ref 0.1–1.0)
NEUTROS PCT: 69 %
Neutro Abs: 5.5 10*3/uL (ref 1.7–7.7)
Platelets: 320 10*3/uL (ref 150–400)
RBC: 2.51 MIL/uL — AB (ref 3.87–5.11)
RDW: 16.3 % — ABNORMAL HIGH (ref 11.5–15.5)
WBC: 7.8 10*3/uL (ref 4.0–10.5)

## 2017-08-07 LAB — HEMOGLOBIN AND HEMATOCRIT, BLOOD
HEMATOCRIT: 33.1 % — AB (ref 36.0–46.0)
HEMOGLOBIN: 10.5 g/dL — AB (ref 12.0–15.0)

## 2017-08-07 LAB — CBG MONITORING, ED: GLUCOSE-CAPILLARY: 139 mg/dL — AB (ref 65–99)

## 2017-08-07 LAB — PREPARE RBC (CROSSMATCH)

## 2017-08-07 MED ORDER — SODIUM CHLORIDE 0.9 % IV SOLN
10.0000 mL/h | Freq: Once | INTRAVENOUS | Status: AC
  Administered 2017-08-07: 10 mL/h via INTRAVENOUS

## 2017-08-07 MED ORDER — SODIUM CHLORIDE 0.9 % IV SOLN: 250.0000 mL | INTRAVENOUS | Status: DC | PRN

## 2017-08-07 MED ORDER — AMLODIPINE BESYLATE 5 MG PO TABS
10.0000 mg | ORAL_TABLET | Freq: Every day | ORAL | Status: DC
  Administered 2017-08-07 – 2017-08-09 (×3): 10 mg via ORAL
  Filled 2017-08-07 (×3): qty 2

## 2017-08-07 MED ORDER — ACETAMINOPHEN 325 MG PO TABS: 650.0000 mg | ORAL_TABLET | Freq: Four times a day (QID) | ORAL | Status: DC | PRN

## 2017-08-07 MED ORDER — METOPROLOL TARTRATE 25 MG PO TABS
12.5000 mg | ORAL_TABLET | Freq: Two times a day (BID) | ORAL | Status: DC
  Administered 2017-08-07 – 2017-08-09 (×5): 12.5 mg via ORAL
  Filled 2017-08-07 (×5): qty 1

## 2017-08-07 MED ORDER — DOCUSATE SODIUM 100 MG PO CAPS
100.0000 mg | ORAL_CAPSULE | Freq: Every day | ORAL | Status: DC
  Administered 2017-08-07 – 2017-08-09 (×3): 100 mg via ORAL
  Filled 2017-08-07 (×3): qty 1

## 2017-08-07 MED ORDER — ENSURE ENLIVE PO LIQD
237.0000 mL | Freq: Two times a day (BID) | ORAL | Status: DC
  Administered 2017-08-09: 237 mL via ORAL

## 2017-08-07 MED ORDER — SODIUM CHLORIDE 0.9% FLUSH
3.0000 mL | Freq: Two times a day (BID) | INTRAVENOUS | Status: DC
  Administered 2017-08-07 – 2017-08-09 (×4): 3 mL via INTRAVENOUS

## 2017-08-07 MED ORDER — LEVOTHYROXINE SODIUM 50 MCG PO TABS
50.0000 ug | ORAL_TABLET | Freq: Every day | ORAL | Status: DC
  Administered 2017-08-07 – 2017-08-09 (×3): 50 ug via ORAL
  Filled 2017-08-07 (×3): qty 1

## 2017-08-07 MED ORDER — INSULIN ASPART 100 UNIT/ML ~~LOC~~ SOLN: 0.0000 [IU] | Freq: Three times a day (TID) | SUBCUTANEOUS | Status: DC

## 2017-08-07 MED ORDER — INSULIN GLARGINE 100 UNIT/ML ~~LOC~~ SOLN: 24.0000 [IU] | Freq: Every day | SUBCUTANEOUS | Status: DC

## 2017-08-07 MED ORDER — ONDANSETRON HCL 4 MG PO TABS: 4.0000 mg | ORAL_TABLET | Freq: Three times a day (TID) | ORAL | Status: DC | PRN

## 2017-08-07 MED ORDER — GABAPENTIN 300 MG PO CAPS
600.0000 mg | ORAL_CAPSULE | Freq: Every day | ORAL | Status: DC
  Administered 2017-08-07 – 2017-08-08 (×2): 600 mg via ORAL
  Filled 2017-08-07 (×3): qty 2

## 2017-08-07 MED ORDER — SODIUM CHLORIDE 0.9% FLUSH: 3.0000 mL | INTRAVENOUS | Status: DC | PRN

## 2017-08-07 MED ORDER — INSULIN GLARGINE 100 UNIT/ML ~~LOC~~ SOLN
24.0000 [IU] | Freq: Every day | SUBCUTANEOUS | Status: DC
  Filled 2017-08-07: qty 0.24

## 2017-08-07 MED ORDER — PANTOPRAZOLE SODIUM 40 MG PO TBEC
40.0000 mg | DELAYED_RELEASE_TABLET | Freq: Two times a day (BID) | ORAL | Status: DC
  Administered 2017-08-07 – 2017-08-09 (×5): 40 mg via ORAL
  Filled 2017-08-07 (×5): qty 1

## 2017-08-07 NOTE — Progress Notes (Signed)
Patient admitted to the hospital earlier this morning by Dr. Darrick Meigs.  Patient seen and examined.  She is not had any shortness of breath or chest pain.  Lungs are clear bilaterally, heart sounds are regular.  Patient has been admitted to the hospital with symptomatic anemia.  She is been having dark colored stools for the past 2 weeks.  She was recently in the hospital approximately a week ago and was transfused 2 units PRBC.  She returned to the ER today with symptomatic anemia requiring further transfusions.  2 units of PRBCs have been ordered and she is already received 1 unit.  She is currently n.p.o.  Iron studies were performed 2 weeks ago which showed adequate iron stores.  GI has been consulted to determine further workup.  She is currently n.p.o.  [name]

## 2017-08-07 NOTE — Care Management Obs Status (Signed)
[place] NOTIFICATION   Patient Details  Name: [name] MRN: [id number] Date of Birth: 1940/06/29   Medicare Observation Status Notification Given:  Yes    Sherald Barge, RN 08/07/2017, 10:51 AM

## 2017-08-07 NOTE — ED Triage Notes (Signed)
Pt states she received a call from her PCP telling her that her Hgb was 8.4. Pt states she wanted to be seen "because I almost died before when it was too low."

## 2017-08-07 NOTE — Consult Note (Addendum)
[facility] Consultation Oncology  Name: TUANA HOHEISEL      MRN: [id number]    Location: A326/A326-01  Date: 08/07/2017 Time:11:37 AM   REFERRING PHYSICIAN:  Dr. Roderic Palau, [facility] Hospitalist  REASON FOR CONSULT:  Anemia    DIAGNOSIS:  Anemia d/t blood loss with element of anemia d/t CKD  HISTORY OF PRESENT ILLNESS: Ms. Renken 77 y.o. female, who is well-known to [facility].  She has been followed by our hematology/oncology team for chronic h/o normocytic normochromic anemia in the setting of CKD and positive heme stool cards.  Historically, we treated her with Aranesp inj until 05/22/17 when ESA therapy was discontinued d/t adequate Hgb >11 g/dL consistently without Aranesp.   This current episode of anemia dates back to late 06/2017, when the patient called cancer center concerned about dark stools on 07/24/17.  Her Hgb on 07/25/17 was 10.8 g/dL; her iron studies at that time were largely normal.  Hgb prior on 07/22/17 was stable at 11.3 g/dL.  Recommended she follow-up with GI if she is having black stools. Dr. Oneida Alar recommended that if patient was having continuous black stools, then she should go to the ED; if not, then she recommended pt wait to hear from hematology/oncology.  Patient elected not to go to the ED at that time.  She waited and saw her PCP on 07/29/17, who ordered CBC revealing further decrease in Hgb to 9 g/dL.  Patient did present to ED on 07/30/17 with Hgb 8.4 g/dL and was admitted from  07/30/17-07/31/17. Fecal occult blood testing positive. Received 2 units PRBCs during hospitalization.  Hgb after 2 units PRBCs was 9.6 g/dL and she was felt stable for discharge home at that time.    On 08/02/17, she had follow-up with Dr. Walden Field ([facility] onc[facility]) at [facility]. Hgb was stable at 9.3 g/dL Aranesp 200 mcg was resumed at that visit. Hemoglobin continued to trend down.  On 08/06/17, she saw her PCP as an outpatient; Hgb at that time was 8.4 g/dL. She declined to go to ED at  that time for further evaluation per PCP's documentation.   Today, 08/07/17, she presented to ED with symptomatic anemia; Hgb low at 7.5 g/dL.  Continues to report dark/melanotic stools and endorses weakness and dizziness.  She has been admitted for symptomatic anemia and further evaluation.    ---  Seen today in inpatient room at [facility] with Dr. Delton Coombes.  Overall, she tells [place] she has been feeling better.  Continues to have black/tarry stools.  She is aware that GI has seen her for evaluation since she has been in the hospital. She has received 2 units PRBCs and reportedly tolerated infusion well.     Hemoglobin trend noted below:     PAST MEDICAL HISTORY:   Past Medical History:  Diagnosis Date  . Anemia   . Arthritis   . Blood transfusion without reported diagnosis   . Cataract   . Chronic anemia   . Chronic combined systolic and diastolic CHF (congestive heart failure) ([place])    a. 2D echo 08/2016 at [facility]: EF 50-55% with inferior wall HK, impaired LV filling, fair study.  . CKD (chronic kidney disease), stage III ([place])   . Diabetes ([place])   . Gastritis   . GERD (gastroesophageal reflux disease)   . Gout   . HTN (hypertension)   . Hyperlipidemia   . Iron deficiency anemia 11/08/2016  . Normocytic anemia 10/26/2016  . NSTEMI (non-ST elevated myocardial infarction) ([place])  a. Complex admission 08/2016 - with severe hyperglycemia, AKI on CKD, severe anemia down to Hgb 6.8, acute combined CHF, troponin of 8.5, cath deferred due to renal dysfunction.  . Thyroid disease     ALLERGIES: Allergies  Allergen Reactions  . Insulin Glargine Swelling    "Makes me swell like a balloon all over", including face, but without any respiratory distress or rashes. Associated with weight gain.  . Statins Other (See Comments)    "I've tried them all; my muscle aches were so bad I couldn't walk".      MEDICATIONS: I have reviewed the patient's current medications.     PAST SURGICAL HISTORY Past  Surgical History:  Procedure Laterality Date  . COLONOSCOPY WITH PROPOFOL N/A 10/08/2016   Procedure: COLONOSCOPY WITH PROPOFOL;  Surgeon: Ronnette Juniper, MD;  Location: [place];  Service: Gastroenterology;  Laterality: N/A;  . ESOPHAGOGASTRODUODENOSCOPY N/A 10/06/2016   Procedure: ESOPHAGOGASTRODUODENOSCOPY (EGD);  Surgeon: Otis Brace, MD;  Location: [facility] ENDOSCOPY;  Service: Gastroenterology;  Laterality: N/A;  . ESOPHAGOGASTRODUODENOSCOPY (EGD) WITH PROPOFOL N/A 03/12/2017   Procedure: ESOPHAGOGASTRODUODENOSCOPY (EGD) WITH PROPOFOL;  Surgeon: Danie Binder, MD;  [name];  Service: Endoscopy;  Laterality: N/A;  . GIVENS CAPSULE STUDY  10/08/2016   Procedure: GIVENS CAPSULE STUDY;  Surgeon: Ronnette Juniper, MD;  Location: [place];  Service: Gastroenterology;;  . Freda Munro CAPSULE STUDY N/A 10/29/2016   Procedure: GIVENS CAPSULE STUDY;  Surgeon: Danie Binder, MD;  [name];  Service: Endoscopy;  Laterality: N/A;  . ORIF HUMERUS FRACTURE Left 02/07/2015   Procedure: OPEN REDUCTION INTERNAL FIXATION (ORIF) PROXIMAL HUMERUS FRACTURE;  Surgeon: Marybelle Killings, MD;  Location: [place];  Service: Orthopedics;  Laterality: Left;  . THYROID SURGERY    . TOTAL HIP ARTHROPLASTY Left 02/07/2015   Procedure: TOTAL HIP ARTHROPLASTY ANTERIOR APPROACH ;  Surgeon: Marybelle Killings, MD;  Location: [place];  Service: Orthopedics;  Laterality: Left;  . WRIST SURGERY Left     FAMILY HISTORY: Family History  Problem Relation Age of Onset  . Diabetes Mother   . Asthma Mother   . Early death Mother 39       Pneumonia  . Early death Father        Killed at rodeo  . CAD Neg Hx   . GI Bleed Neg Hx     SOCIAL HISTORY:  reports that  has never smoked. she has never used smokeless tobacco. She reports that she does not drink alcohol or use drugs.  PERFORMANCE STATUS: The patient's performance status is 1 - Symptomatic but completely ambulatory  PHYSICAL EXAM: Most Recent Vital  Signs: Blood pressure (!) 148/53, pulse 79, temperature 98.7 F (37.1 C), temperature source Oral, resp. rate 16, height '5\' 4"'  (1.626 m), weight 172 lb 3.2 oz (78.1 kg), SpO2 100 %.  Physical Exam  Constitutional: She is oriented to person, place, and time.  HENT:  Head: Normocephalic.  Mouth/Throat: Oropharynx is clear and moist.  Eyes: Conjunctivae are normal. No scleral icterus.  Neck: Normal range of motion.  Cardiovascular: Normal rate and regular rhythm.  Pulmonary/Chest: Effort normal and breath sounds normal. No respiratory distress.  Abdominal: Soft. Bowel sounds are normal. There is no tenderness.  Neurological: She is alert and oriented to person, place, and time.  Skin: Skin is warm and dry.  Psychiatric: Mood and affect normal.  Nursing note and vitals reviewed.    LABORATORY DATA:  Results for orders placed or performed during the hospital  encounter of 08/07/17 (from the past 48 hour(s))  CBC with Differential/Platelet     Status: Abnormal   Collection Time: 08/07/17  2:17 AM  Result Value Ref Range   WBC 7.8 4.0 - 10.5 K/uL   RBC 2.51 (L) 3.87 - 5.11 MIL/uL   Hemoglobin 7.5 (L) 12.0 - 15.0 g/dL   HCT 24.0 (L) 36.0 - 46.0 %   MCV 95.6 78.0 - 100.0 fL   MCH 29.9 26.0 - 34.0 pg   MCHC 31.3 30.0 - 36.0 g/dL   RDW 16.3 (H) 11.5 - 15.5 %   Platelets 320 150 - 400 K/uL   Neutrophils Relative % 69 %   Neutro Abs 5.5 1.7 - 7.7 K/uL   Lymphocytes Relative 17 %   Lymphs Abs 1.3 0.7 - 4.0 K/uL   Monocytes Relative 7 %   Monocytes Absolute 0.5 0.1 - 1.0 K/uL   Eosinophils Relative 6 %   Eosinophils Absolute 0.5 0.0 - 0.7 K/uL   Basophils Relative 1 %   Basophils Absolute 0.0 0.0 - 0.1 K/uL    Comment: Performed at [facility], [address], [place], [place] [address]  Basic metabolic panel     Status: Abnormal   Collection Time: 08/07/17  2:17 AM  Result Value Ref Range   Sodium 136 135 - 145 mmol/L   Potassium 4.7 3.5 - 5.1 mmol/L   Chloride 104 101 - 111 mmol/L    CO2 22 22 - 32 mmol/L   Glucose, Bld 132 (H) 65 - 99 mg/dL   BUN 59 (H) 6 - 20 mg/dL   Creatinine, Ser 2.71 (H) 0.44 - 1.00 mg/dL   Calcium 8.9 8.9 - 10.3 mg/dL   GFR calc non Af Amer 16 (L) >60 mL/min   GFR calc Af Amer 18 (L) >60 mL/min    Comment: (NOTE) The eGFR has been calculated using the CKD EPI equation. This calculation has not been validated in all clinical situations. eGFR's persistently <60 mL/min signify possible Chronic Kidney Disease.    Anion gap 10 5 - 15    Comment: Performed at [facility], [address] [place], [place] [address]  Type and screen     Status: None (Preliminary result)   Collection Time: 08/07/17  2:17 AM  Result Value Ref Range   ABO/RH(D) B POS    Antibody Screen NEG    Sample Expiration 08/10/2017    Unit Number [id number]    Blood Component Type RED CELLS,LR    Unit division 00    Status of Unit ISSUED    Transfusion Status OK TO TRANSFUSE    Crossmatch Result      Compatible Performed at [facility], [address] [place], [place] [address]    Unit Number [id number]    Blood Component Type RED CELLS,LR    Unit division 00    Status of Unit ALLOCATED    Transfusion Status OK TO TRANSFUSE    Crossmatch Result Compatible   Prepare RBC     Status: None   Collection Time: 08/07/17  2:46 AM  Result Value Ref Range   Order Confirmation      ORDER PROCESSED BY BLOOD BANK Performed at [facility], [address] [place],  [address]   CBG monitoring, ED     Status: Abnormal   Collection Time: 08/07/17  6:11 AM  Result Value Ref Range   Glucose-Capillary 139 (H) 65 - 99 mg/dL  Glucose, capillary     Status: Abnormal  Collection Time: 08/07/17  9:07 AM  Result Value Ref Range   Glucose-Capillary 129 (H) 65 - 99 mg/dL   Comment 1 Notify RN    Comment 2 Document in Chart   Glucose, capillary     Status: Abnormal   Collection Time: 08/07/17 11:23 AM  Result Value Ref Range   Glucose-Capillary 141 (H) 65 - 99 mg/dL    Comment 1 Notify RN    Comment 2 Document in Chart       RADIOGRAPHY: No results found.     PATHOLOGY:           ASSESSMENT & PLAN:   Anemia:  -Likely multifactorial. Current symptomatic anemia likely d/t GI bleed given fecal occult blood positive on 07/30/17.  She has had extensive GI work-up in the past, without definitive source of bleeding identified.  GI consult pending for this hospitalization.   -Likely an element of iron deficiency given bleeding. Can check her iron studies as an outpatient, as recent blood transfusions would alter these values.  -Also with element of anemia d/t stage 4 CKD. Historically treated with Aranesp with excellent improvement in her Hgb to >11 g/dL.  ESA therapy was subsequently discontinued in late 04/2017 d/t adequate and stable hemoglobin in the ~12 g/dL range.  Her hemoglobin began to trend down in late 06/2017 with recent GIB episodes.  Aranesp 200 mcg Q4weeks was resumed on 08/02/17.  Will plan to continue Aranesp as an outpatient.   -Vitamin B12 and folate have historically been normal; we can recheck as an outpatient if needed.        Dispo:  -Patient well-known to [facility]. We will plan for short-interval outpatient follow-up to discuss further plan of care once she recovers from current hospitalization. Will ask our scheduling team to make an appointment at the cancer center in ~7-10 days for outpatient follow-up.     All questions were answered. The patient knows to call the clinic with any problems, questions or concerns. We can certainly see the patient much sooner if necessary.   The patient and plan discussed with Dr. Deland Pretty [facility] Medical Oncologist/Hematologist and he is in agreement with the aforementioned.   Mike Craze, NP [place] [phone]  I have seen this patient with my nurse practitioner independently.  She has anemia which is multifactorial, from underlying CKD and possible  GI blood loss.  She was previously treated with Aranesp injections.  She will follow-up in my clinic, to reinitiate erythropoiesis stimulating agents and intravenous iron.  She was given an appointment to our clinic.  Derek Jack, MD [place]

## 2017-08-07 NOTE — H&P (Signed)
TRH H&P    Patient Demographics:    [name], is a 77 y.o. female  MRN: [id number]  DOB - 12/29/1940  Admit Date - 08/07/2017  Referring MD/NP/PA: Joseph Berkshire  Outpatient Primary MD for the patient is Caren Macadam, MD  Patient coming from: Home  Chief complaint-dizziness   HPI:    Chivon [name]  is a 77 y.o. female, with history of GI bleed, severe anemia, CKD stage IV, diabetes mellitus, hypertension, hyperlipidemia who was recently discharged from the hospital a week ago, After she received blood transfusion fo severe symptomatic anemia.  Patient has had extensive GI workup including EGD, colonoscopy, capsule endoscopy which has been unremarkable.  No clear source of bleeding was identified during these procedures. Patient says that she noticed black colored stool for past 1 week, and started feeling dizziness today.  She denies passing out.  No chest pain or shortness of breath. Did not notice frank blood in the stool  Denies vomiting No fever or dysuria. Denies abdominal pain in the ED  In the ED hemoglobin was found to be 7.5 down from 8.4 yesterday. Patient is currently receiving 2 units of PRBC.    Review of systems:      All other systems reviewed and are negative.   With Past History of the following :    Past Medical History:  Diagnosis Date  . Anemia   . Arthritis   . Blood transfusion without reported diagnosis   . Cataract   . Chronic anemia   . Chronic combined systolic and diastolic CHF (congestive heart failure) ([place])    a. 2D echo 08/2016 at [facility]: EF 50-55% with inferior wall HK, impaired LV filling, fair study.  . CKD (chronic kidney disease), stage III ([place])   . Diabetes ([place])   . Gastritis   . GERD (gastroesophageal reflux disease)   . Gout   . HTN (hypertension)   . Hyperlipidemia   . Iron deficiency anemia 11/08/2016  . Normocytic anemia 10/26/2016  .  NSTEMI (non-ST elevated myocardial infarction) ([place])    a. Complex admission 08/2016 - with severe hyperglycemia, AKI on CKD, severe anemia down to Hgb 6.8, acute combined CHF, troponin of 8.5, cath deferred due to renal dysfunction.  . Thyroid disease       Past Surgical History:  Procedure Laterality Date  . COLONOSCOPY WITH PROPOFOL N/A 10/08/2016   Procedure: COLONOSCOPY WITH PROPOFOL;  Surgeon: Ronnette Juniper, MD;  Location: [place];  Service: Gastroenterology;  Laterality: N/A;  . ESOPHAGOGASTRODUODENOSCOPY N/A 10/06/2016   Procedure: ESOPHAGOGASTRODUODENOSCOPY (EGD);  Surgeon: Otis Brace, MD;  Location: [facility] ENDOSCOPY;  Service: Gastroenterology;  Laterality: N/A;  . ESOPHAGOGASTRODUODENOSCOPY (EGD) WITH PROPOFOL N/A 03/12/2017   Procedure: ESOPHAGOGASTRODUODENOSCOPY (EGD) WITH PROPOFOL;  Surgeon: Danie Binder, MD;  [name];  Service: Endoscopy;  Laterality: N/A;  . GIVENS CAPSULE STUDY  10/08/2016   Procedure: GIVENS CAPSULE STUDY;  Surgeon: Ronnette Juniper, MD;  Location: [place];  Service: Gastroenterology;;  . Freda Munro CAPSULE STUDY N/A 10/29/2016   Procedure: GIVENS CAPSULE STUDY;  Surgeon: Danie Binder, MD;  [name];  Service: Endoscopy;  Laterality: N/A;  . ORIF HUMERUS FRACTURE Left 02/07/2015   Procedure: OPEN REDUCTION INTERNAL FIXATION (ORIF) PROXIMAL HUMERUS FRACTURE;  Surgeon: Marybelle Killings, MD;  Location: [place];  Service: Orthopedics;  Laterality: Left;  . THYROID SURGERY    . TOTAL HIP ARTHROPLASTY Left 02/07/2015   Procedure: TOTAL HIP ARTHROPLASTY ANTERIOR APPROACH ;  Surgeon: Marybelle Killings, MD;  Location: [place];  Service: Orthopedics;  Laterality: Left;  . WRIST SURGERY Left       Social History:      Social History   Tobacco Use  . Smoking status: Never Smoker  . Smokeless tobacco: Never Used  Substance Use Topics  . Alcohol use: No    Alcohol/week: 0.0 oz       Family History :     Family History  Problem  Relation Age of Onset  . Diabetes Mother   . Asthma Mother   . Early death Mother 61       Pneumonia  . Early death Father        Killed at rodeo  . CAD Neg Hx   . GI Bleed Neg Hx       Home Medications:   Prior to Admission medications   Medication Sig Start Date End Date Taking? Authorizing Provider  acetaminophen (TYLENOL) 325 MG tablet Take 2 tablets (650 mg total) by mouth every 6 (six) hours as needed for mild pain or headache (or Fever >/= 101). 07/31/17   Barton Dubois, MD  amLODipine (NORVASC) 10 MG tablet Take 1 tablet (10 mg total) by mouth daily. 01/25/17   [name], MD  docusate sodium (COLACE) 100 MG capsule Take 1 capsule (100 mg total) by mouth every 12 (twelve) hours. Patient taking differently: Take 100 mg by mouth daily.  06/24/17   Long, Wonda Olds, MD  furosemide (LASIX) 20 MG tablet Take 20 mg by mouth as needed. Only has to take it when she gains 2 lbs 09/20/16   [provider]  gabapentin (NEURONTIN) 300 MG capsule Take 2 capsules (600 mg total) by mouth at bedtime. 06/04/17   [name], MD  hydrALAZINE (APRESOLINE) 25 MG tablet TAKE 1 TABLET BY MOUTH EVERY 8 HOURS AS NEEDED 04/29/17   [name], MD  Insulin Glargine (TOUJEO MAX SOLOSTAR) 300 UNIT/ML SOPN Inject 20 Units at bedtime into the skin. Patient taking differently: Inject 24 Units into the skin at bedtime.  04/08/17   Cassandria Anger, MD  levothyroxine (SYNTHROID, LEVOTHROID) 50 MCG tablet TAKE 1 TABLET BY MOUTH ONCE DAILY BEFORE  BREAKFAST 06/24/17   Cassandria Anger, MD  metoprolol tartrate (LOPRESSOR) 25 MG tablet Take 12.5 mg by mouth 2 (two) times daily.    [provider]  Multiple Vitamin (MULTIVITAMIN WITH MINERALS) TABS tablet Take 1 tablet by mouth daily.    [provider]  ondansetron (ZOFRAN) 4 MG tablet Take 1 tablet (4 mg total) by mouth every 8 (eight) hours as needed for nausea or vomiting. 10/25/16   [name], MD    pantoprazole (PROTONIX) 40 MG tablet Take 1 tablet (40 mg total) by mouth 2 (two) times daily. 30 minutes before breakfast 07/31/17   Barton Dubois, MD     Allergies:     Allergies  Allergen Reactions  . Insulin Glargine Swelling    "Makes me swell like a balloon all over", including face, but without  any respiratory distress or rashes. Associated with weight gain.  . Statins Other (See Comments)    "I've tried them all; my muscle aches were so bad I couldn't walk".     Physical Exam:   Vitals  Blood pressure (!) 134/50, pulse 74, temperature 97.9 F (36.6 C), resp. rate 18, weight 78.5 kg (173 lb), SpO2 98 %.  1.  General: Appears in no acute distress  2. Psychiatric:  Intact judgement and  insight, awake alert, oriented x 3.  3. Neurologic: No focal neurological deficits, all cranial nerves intact.Strength 5/5 all 4 extremities, sensation intact all 4 extremities, plantars down going.  4. Eyes :  anicteric sclerae, moist conjunctivae with no lid lag. PERRLA.  5. ENMT:  Oropharynx clear with moist mucous membranes and good dentition  6. Neck:  supple, no cervical lymphadenopathy appriciated, No thyromegaly  7. Respiratory : Normal respiratory effort, good air movement bilaterally,clear to  auscultation bilaterally  8. Cardiovascular : RRR, no gallops, rubs or murmurs, no leg edema  9. Gastrointestinal:  Positive bowel sounds, abdomen soft, non-tender to palpation,no hepatosplenomegaly, no rigidity or guarding       10. Skin:  No cyanosis, normal texture and turgor, no rash, lesions or ulcers  11.Musculoskeletal:  Good muscle tone,  joints appear normal , no effusions,  normal range of motion    Data Review:    CBC Recent Labs  Lab 08/02/17 0919 08/06/17 1615 08/07/17 0217  WBC 5.5 6.8 7.8  HGB 9.3* 8.4* 7.5*  HCT 28.6* 26.6* 24.0*  PLT 293 323 320  MCV 92.0 94.3 95.6  MCH 29.9 29.8 29.9  MCHC 32.5 31.6 31.3  RDW 15.6* 16.6* 16.3*  LYMPHSABS 1.0  1.4 1.3  MONOABS 0.4 0.3 0.5  EOSABS 0.3 0.4 0.5  BASOSABS 0.0 0.1 0.0   ------------------------------------------------------------------------------------------------------------------  Chemistries  Recent Labs  Lab 08/02/17 0919 08/07/17 0217  NA 140 136  K 5.5* 4.7  CL 107 104  CO2 24 22  GLUCOSE 176* 132*  BUN 70* 59*  CREATININE 2.50* 2.71*  CALCIUM 9.2 8.9  AST 17  --   ALT 14  --   ALKPHOS 54  --   BILITOT 0.5  --    ------------------------------------------------------------------------------------------------------------------  ------------------------------------------------------------------------------------------------------------------ GFR: Estimated Creatinine Clearance: 17.6 mL/min (A) (by C-G formula based on SCr of 2.71 mg/dL (H)). Liver Function Tests: Recent Labs  Lab 08/02/17 0919  AST 17  ALT 14  ALKPHOS 54  BILITOT 0.5  PROT 6.4*  ALBUMIN 3.5   No results for input(s): LIPASE, AMYLASE in the last 168 hours. No results for input(s): AMMONIA in the last 168 hours. Coagulation Profile: No results for input(s): INR, PROTIME in the last 168 hours. Cardiac Enzymes: No results for input(s): CKTOTAL, CKMB, CKMBINDEX, TROPONINI in the last 168 hours. BNP (last 3 results) No results for input(s): PROBNP in the last 8760 hours. HbA1C: No results for input(s): HGBA1C in the last 72 hours. CBG: Recent Labs  Lab 07/31/17 0734 07/31/17 1131 07/31/17 1616 08/07/17 0611  GLUCAP 78 116* 74 139*   Lipid Profile: No results for input(s): CHOL, HDL, LDLCALC, TRIG, CHOLHDL, LDLDIRECT in the last 72 hours. Thyroid Function Tests: No results for input(s): TSH, T4TOTAL, FREET4, T3FREE, THYROIDAB in the last 72 hours. Anemia Panel: No results for input(s): VITAMINB12, FOLATE, FERRITIN, TIBC, IRON, RETICCTPCT in the last 72 hours.  --------------------------------------------------------------------------------------------------------------- Urine  analysis:    Component Value Date/Time   COLORURINE YELLOW 03/09/2017 0801   APPEARANCEUR CLEAR 03/09/2017 0801  LABSPEC 1.013 03/09/2017 0801   PHURINE 5.0 03/09/2017 0801   GLUCOSEU 50 (A) 03/09/2017 0801   HGBUR NEGATIVE 03/09/2017 0801   BILIRUBINUR NEGATIVE 03/09/2017 0801   KETONESUR NEGATIVE 03/09/2017 0801   PROTEINUR 30 (A) 03/09/2017 0801   UROBILINOGEN 0.2 02/08/2015 1615   NITRITE NEGATIVE 03/09/2017 0801   LEUKOCYTESUR NEGATIVE 03/09/2017 0801      Imaging Results:    No results found.  My personal review of EKG-normal sinus rhythm, nonspecific ST changes.   Assessment & Plan:    Active Problems:   Anemia   1. Symptomatic anemia-patient hemoglobin is 7.5, came with dizziness.  2 units of PRBC ordered.  Follow CBC in a.m.  Will also consult hematology for possible underlying hematological abnormality contributing to patient's anemia.  Consider bone marrow biopsy. 2. Chronic kidney disease stage IV-creatinine is stable at 2.71. 3. Melena-we will check FOBT, previously patient has had with positive stools and had extensive GI workup including EGD, colonoscopy, capsule endoscopy which has been unremarkable for GI bleed.  Will consult GI for further evaluation  4. Hypertension-continue home antihypertensive regimen 5. Diabetes mellitus-start sliding scale insulin with NovoLog, continue  insulin glargine 6. Hypothyroidism-continue Synthroid    DVT Prophylaxis-    SCDs   AM Labs Ordered, also please review Full Orders  Family Communication: Admission, patients condition and plan of care including tests being ordered have been discussed with the patient and her husband at bedside* who indicate understanding and agree with the plan and Code Status.  Code Status: Full code  Admission status: Observation  Time spent in minutes : 60 minutes   Oswald Hillock M.D on 08/07/2017 at 6:31 AM  Between 7am to 7pm - Pager - [phone]. After 7pm go to [email or web address] -  password [facility]  [facility] - Office  [phone]

## 2017-08-07 NOTE — ED Provider Notes (Addendum)
[facility] EMERGENCY DEPARTMENT Provider Note   CSN: [id number] Arrival date & time: 08/07/17  0050     History   Chief Complaint Chief Complaint  Patient presents with  . Abnormal Lab    HPI [name] is a 77 y.o. female.  Patient presents to the ER for evaluation of anemia and GI bleeding.  Patient reports that she has a history of profound anemia requiring large volume transfusion secondary to GI bleeding in the past.  She has been doing well the early part of this year.  She was receiving iron transfusions as an outpatient and her hemoglobin was over 13.  She started having melanotic stools again and ended up being admitted for a hemoglobin of 8.4 earlier this month.  She was transfused 2 units of red blood cells and discharged.  Since discharge she has continued to see dark and melanotic stools and now has become weak, dizzy with standing.  Hemoglobin at her doctor yesterday was once again 8.4.      Past Medical History:  Diagnosis Date  . Anemia   . Arthritis   . Blood transfusion without reported diagnosis   . Cataract   . Chronic anemia   . Chronic combined systolic and diastolic CHF (congestive heart failure) ([place])    a. 2D echo 08/2016 at [facility]: EF 50-55% with inferior wall HK, impaired LV filling, fair study.  . CKD (chronic kidney disease), stage III ([place])   . Diabetes ([place])   . Gastritis   . GERD (gastroesophageal reflux disease)   . Gout   . HTN (hypertension)   . Hyperlipidemia   . Iron deficiency anemia 11/08/2016  . Normocytic anemia 10/26/2016  . NSTEMI (non-ST elevated myocardial infarction) ([place])    a. Complex admission 08/2016 - with severe hyperglycemia, AKI on CKD, severe anemia down to Hgb 6.8, acute combined CHF, troponin of 8.5, cath deferred due to renal dysfunction.  . Thyroid disease     Patient Active Problem List   Diagnosis Date Noted  . GI bleed 07/30/2017  . Hyperkalemia 07/30/2017  . Essential hypertension, benign 12/13/2016  .  Hypothyroidism 12/13/2016  . Iron deficiency anemia 11/08/2016  . Aortic atherosclerosis ([place]) 10/25/2016  . Symptomatic anemia 10/04/2016  . History of non-ST elevation myocardial infarction (NSTEMI) 10/04/2016  . Neuropathy 09/20/2016  . Chronic kidney disease, stage IV (severe) ([place]) 08/03/2015  . Hip fracture ([place]) 02/05/2015  . Diabetes mellitus with stage 4 chronic kidney disease ([place]) 02/05/2015  . HTN (hypertension) 02/05/2015  . Gout 02/05/2015  . Left humeral fracture 02/05/2015  . Hyperlipidemia associated with type 2 diabetes mellitus ([place]) 05/01/2013    Past Surgical History:  Procedure Laterality Date  . COLONOSCOPY WITH PROPOFOL N/A 10/08/2016   Procedure: COLONOSCOPY WITH PROPOFOL;  Surgeon: Ronnette Juniper, MD;  Location: [place];  Service: Gastroenterology;  Laterality: N/A;  . ESOPHAGOGASTRODUODENOSCOPY N/A 10/06/2016   Procedure: ESOPHAGOGASTRODUODENOSCOPY (EGD);  Surgeon: Otis Brace, MD;  Location[facility] ENDOSCOPY;  Service: Gastroenterology;  Laterality: N/A;  . ESOPHAGOGASTRODUODENOSCOPY (EGD) WITH PROPOFOL N/A 03/12/2017   Procedure: ESOPHAGOGASTRODUODENOSCOPY (EGD) WITH PROPOFOL;  Surgeon: Danie Binder, MD;  [name];  Service: Endoscopy;  Laterality: N/A;  . GIVENS CAPSULE STUDY  10/08/2016   Procedure: GIVENS CAPSULE STUDY;  Surgeon: Ronnette Juniper, MD;  Location: [place];  Service: Gastroenterology;;  . Freda Munro CAPSULE STUDY N/A 10/29/2016   Procedure: GIVENS CAPSULE STUDY;  Surgeon: Danie Binder, MD;  [name];  Service: Endoscopy;  Laterality: N/A;  . ORIF HUMERUS FRACTURE Left 02/07/2015   Procedure: OPEN REDUCTION INTERNAL FIXATION (ORIF) PROXIMAL HUMERUS FRACTURE;  Surgeon: Marybelle Killings, MD;  Location: [place];  Service: Orthopedics;  Laterality: Left;  . THYROID SURGERY    . TOTAL HIP ARTHROPLASTY Left 02/07/2015   Procedure: TOTAL HIP ARTHROPLASTY ANTERIOR APPROACH ;  Surgeon: Marybelle Killings, MD;  Location: [place];   Service: Orthopedics;  Laterality: Left;  . WRIST SURGERY Left     OB History    No data available       Home Medications    Prior to Admission medications   Medication Sig Start Date End Date Taking? Authorizing Provider  acetaminophen (TYLENOL) 325 MG tablet Take 2 tablets (650 mg total) by mouth every 6 (six) hours as needed for mild pain or headache (or Fever >/= 101). 07/31/17   Barton Dubois, MD  amLODipine (NORVASC) 10 MG tablet Take 1 tablet (10 mg total) by mouth daily. 01/25/17   [name], MD  docusate sodium (COLACE) 100 MG capsule Take 1 capsule (100 mg total) by mouth every 12 (twelve) hours. Patient taking differently: Take 100 mg by mouth daily.  06/24/17   Long, Wonda Olds, MD  furosemide (LASIX) 20 MG tablet Take 20 mg by mouth as needed. Only has to take it when she gains 2 lbs 09/20/16   [provider]  gabapentin (NEURONTIN) 300 MG capsule Take 2 capsules (600 mg total) by mouth at bedtime. 06/04/17   [name], MD  hydrALAZINE (APRESOLINE) 25 MG tablet TAKE 1 TABLET BY MOUTH EVERY 8 HOURS AS NEEDED 04/29/17   [name], MD  Insulin Glargine (TOUJEO MAX SOLOSTAR) 300 UNIT/ML SOPN Inject 20 Units at bedtime into the skin. Patient taking differently: Inject 24 Units into the skin at bedtime.  04/08/17   Cassandria Anger, MD  levothyroxine (SYNTHROID, LEVOTHROID) 50 MCG tablet TAKE 1 TABLET BY MOUTH ONCE DAILY BEFORE  BREAKFAST 06/24/17   Cassandria Anger, MD  metoprolol tartrate (LOPRESSOR) 25 MG tablet Take 12.5 mg by mouth 2 (two) times daily.    [provider]  Multiple Vitamin (MULTIVITAMIN WITH MINERALS) TABS tablet Take 1 tablet by mouth daily.    [provider]  ondansetron (ZOFRAN) 4 MG tablet Take 1 tablet (4 mg total) by mouth every 8 (eight) hours as needed for nausea or vomiting. 10/25/16   [name], MD  pantoprazole (PROTONIX) 40 MG tablet Take 1 tablet (40 mg total) by mouth 2 (two) times  daily. 30 minutes before breakfast 07/31/17   Barton Dubois, MD    Family History Family History  Problem Relation Age of Onset  . Diabetes Mother   . Asthma Mother   . Early death Mother 53       Pneumonia  . Early death Father        Killed at rodeo  . CAD Neg Hx   . GI Bleed Neg Hx     Social History Social History   Tobacco Use  . Smoking status: Never Smoker  . Smokeless tobacco: Never Used  Substance Use Topics  . Alcohol use: No    Alcohol/week: 0.0 oz  . Drug use: No     Allergies   Insulin glargine and Statins   Review of Systems Review of Systems  Constitutional: Positive for fatigue.  Gastrointestinal: Positive for blood in stool.  Neurological: Positive for dizziness.  All other systems reviewed and are negative.  Physical Exam Updated Vital Signs BP (!) 132/37   Pulse 80   Temp 98 F (36.7 C)   Resp 18   Wt 78.5 kg (173 lb)   SpO2 98%   BMI 29.70 kg/m   Physical Exam  Constitutional: She is oriented to person, place, and time. She appears well-developed and well-nourished. No distress.  HENT:  Head: Normocephalic and atraumatic.  Right Ear: Hearing normal.  Left Ear: Hearing normal.  Nose: Nose normal.  Mouth/Throat: Oropharynx is clear and moist and mucous membranes are normal.  Eyes: Conjunctivae and EOM are normal. Pupils are equal, round, and reactive to light.  Neck: Normal range of motion. Neck supple.  Cardiovascular: Regular rhythm, S1 normal and S2 normal. Exam reveals no gallop and no friction rub.  No murmur heard. Pulmonary/Chest: Effort normal and breath sounds normal. No respiratory distress. She exhibits no tenderness.  Abdominal: Soft. Normal appearance and bowel sounds are normal. There is no hepatosplenomegaly. There is no tenderness. There is no rebound, no guarding, no tenderness at McBurney's point and negative Murphy's sign. No hernia.  Musculoskeletal: Normal range of motion.  Neurological: She is alert and  oriented to person, place, and time. She has normal strength. No cranial nerve deficit or sensory deficit. Coordination normal. GCS eye subscore is 4. GCS verbal subscore is 5. GCS motor subscore is 6.  Skin: Skin is warm, dry and intact. No rash noted. No cyanosis. There is pallor.  Psychiatric: She has a normal mood and affect. Her speech is normal and behavior is normal. Thought content normal.  Nursing note and vitals reviewed.    ED Treatments / Results  Labs (all labs ordered are listed, but only abnormal results are displayed) Labs Reviewed  CBC WITH DIFFERENTIAL/PLATELET - Abnormal; Notable for the following components:      Result Value   RBC 2.51 (*)    Hemoglobin 7.5 (*)    HCT 24.0 (*)    RDW 16.3 (*)    All other components within normal limits  BASIC METABOLIC PANEL - Abnormal; Notable for the following components:   Glucose, Bld 132 (*)    BUN 59 (*)    Creatinine, Ser 2.71 (*)    GFR calc non Af Amer 16 (*)    GFR calc Af Amer 18 (*)    All other components within normal limits  TYPE AND SCREEN  PREPARE RBC (CROSSMATCH)    EKG  EKG Interpretation None       Radiology No results found.  Procedures Procedures (including critical care time)  Medications Ordered in ED Medications  0.9 %  sodium chloride infusion (not administered)     Initial Impression / Assessment and Plan / ED Course  I have reviewed the triage vital signs and the nursing notes.  Pertinent labs & imaging results that were available during my care of the patient were reviewed by me and considered in my medical decision making (see chart for details).     Patient presents to the emergency department for evaluation of weakness, dizziness in the setting of anemia.  She has a history of recurrent GI bleeding with multiple GI workups not explaining whether he blood loss is coming from.  She was hospitalized earlier this month and received 2 units of packed red blood cells.  Since  leaving the hospital she has continued to have melanotic stools.  Blood counts yesterday were down to 8.4.  Repeat tonight is down to 7.5.  She will require hospitalization for  transfusion for symptomatic anemia.  Final Clinical Impressions(s) / ED Diagnoses   Final diagnoses:  Symptomatic anemia  Gastrointestinal hemorrhage, unspecified gastrointestinal hemorrhage type   CRITICAL CARE Performed by: Orpah Greek.   Total critical care time: 30 minutes  Critical care time was exclusive of separately billable procedures and treating other patients.  Critical care was necessary to treat or prevent imminent or life-threatening deterioration.  Critical care was time spent personally by me on the following activities: development of treatment plan with patient and/or surrogate as well as nursing, discussions with consultants, evaluation of patient's response to treatment, examination of patient, obtaining history from patient or surrogate, ordering and performing treatments and interventions, ordering and review of laboratory studies, ordering and review of radiographic studies, pulse oximetry and re-evaluation of patient's condition.   ED Discharge Orders    None       Pollina, Gwenyth Allegra, MD 08/07/17 3496    Orpah Greek, MD 08/28/17 [phone]

## 2017-08-07 NOTE — Consult Note (Signed)
Reason for Consult: GI bleed Referring Physician:   [name] is an 77 y.o. female.  HPI: admitted thru the ED with melena. Had been having black stools for about 10 days . [facility] to [facility]/2017 for same and received 2 units of PRBCs.  She has received 1 units since this admission. Hx of GIB. Underwent and EGD 03/12/2017 for melena. No source for anemia identified. Mild gastritis. Colonoscopy 10/08/2016 39m polyp  In trasnverse colon, small few diverticulosis, internal hemorrhoids.  Capsule endoscopy 10/08/2016 reveal no abnormalities.  Past Medical History:  Diagnosis Date  . Anemia   . Arthritis   . Blood transfusion without reported diagnosis   . Cataract   . Chronic anemia   . Chronic combined systolic and diastolic CHF (congestive heart failure) ([place]    [facility] with inferior wall HK, impaired LV filling, fair study.  . CKD (chronic kidney disease), stage III ([place]   . Diabetes ([place]   . Gastritis   . GERD (gastroesophageal reflux disease)   . Gout   . HTN (hypertension)   . Hyperlipidemia   . Iron deficiency anemia 11/08/2016  . Normocytic anemia 10/26/2016  . NSTEMI (non-ST elevated myocardial infarction) ([place]    a. Complex admission 08/2016 - with severe hyperglycemia, AKI on CKD, severe anemia down to Hgb 6.8, acute combined CHF, troponin of 8.5, cath deferred due to renal dysfunction.  . Thyroid disease     Past Surgical History:  Procedure Laterality Date  . COLONOSCOPY WITH PROPOFOL N/A 10/08/2016   Procedure: COLONOSCOPY WITH PROPOFOL;  Surgeon: KRonnette Juniper MD;  [place]  Service: Gastroenterology;  Laterality: N/A;  . ESOPHAGOGASTRODUODENOSCOPY N/A 10/06/2016   Procedure: ESOPHAGOGASTRODUODENOSCOPY (EGD);  Surgeon: BOtis Brace MD;  [facility];  Service: Gastroenterology;  Laterality: N/A;  . ESOPHAGOGASTRODUODENOSCOPY (EGD) WITH PROPOFOL N/A 03/12/2017   Procedure: ESOPHAGOGASTRODUODENOSCOPY  (EGD) WITH PROPOFOL;  Surgeon: FDanie Binder MD;  [name];  Service: Endoscopy;  Laterality: N/A;  . GIVENS CAPSULE STUDY  10/08/2016   Procedure: GIVENS CAPSULE STUDY;  Surgeon: KRonnette Juniper MD;  [place]  Service: Gastroenterology;;  . GFreda MunroCAPSULE STUDY N/A 10/29/2016   Procedure: GIVENS CAPSULE STUDY;  Surgeon: FDanie Binder MD;  [name];  Service: Endoscopy;  Laterality: N/A;  . ORIF HUMERUS FRACTURE Left 02/07/2015   Procedure: OPEN REDUCTION INTERNAL FIXATION (ORIF) PROXIMAL HUMERUS FRACTURE;  Surgeon: MMarybelle Killings MD;  [place]  Service: Orthopedics;  Laterality: Left;  . THYROID SURGERY    . TOTAL HIP ARTHROPLASTY Left 02/07/2015   Procedure: TOTAL HIP ARTHROPLASTY ANTERIOR APPROACH ;  Surgeon: MMarybelle Killings MD;  [place]  Service: Orthopedics;  Laterality: Left;  . WRIST SURGERY Left     Family History  Problem Relation Age of Onset  . Diabetes Mother   . Asthma Mother   . Early death Mother 519      Pneumonia  . Early death Father        Killed at rodeo  . CAD Neg Hx   . GI Bleed Neg Hx     Social History:  reports that  has never smoked. she has never used smokeless tobacco. She reports that she does not drink alcohol or use drugs.  Allergies:  Allergies  Allergen Reactions  . Insulin Glargine Swelling    "Makes me swell like a balloon all over", including face, but without any  respiratory distress or rashes. Associated with weight gain.  . Statins Other (See Comments)    "I've tried them all; my muscle aches were so bad I couldn't walk".    Medications: I have reviewed the patient's current medications.  Results for orders placed or performed during the hospital encounter of 08/07/17 (from the past 48 hour(s))  CBC with Differential/Platelet     Status: Abnormal   Collection Time: 08/07/17  2:17 AM  Result Value Ref Range   WBC 7.8 4.0 - 10.5 K/uL   RBC 2.51 (L) 3.87 - 5.11 MIL/uL   Hemoglobin  7.5 (L) 12.0 - 15.0 g/dL   HCT 24.0 (L) 36.0 - 46.0 %   MCV 95.6 78.0 - 100.0 fL   MCH 29.9 26.0 - 34.0 pg   MCHC 31.3 30.0 - 36.0 g/dL   RDW 16.3 (H) 11.5 - 15.5 %   Platelets 320 150 - 400 K/uL   Neutrophils Relative % 69 %   Neutro Abs 5.5 1.7 - 7.7 K/uL   Lymphocytes Relative 17 %   Lymphs Abs 1.3 0.7 - 4.0 K/uL   Monocytes Relative 7 %   Monocytes Absolute 0.5 0.1 - 1.0 K/uL   Eosinophils Relative 6 %   Eosinophils Absolute 0.5 0.0 - 0.7 K/uL   Basophils Relative 1 %   Basophils Absolute 0.0 0.0 - 0.1 K/uL    Comment: Performed at [facility], [address] [place], [place] [address]  Basic metabolic panel     Status: Abnormal   Collection Time: 08/07/17  2:17 AM  Result Value Ref Range   Sodium 136 135 - 145 mmol/L   Potassium 4.7 3.5 - 5.1 mmol/L   Chloride 104 101 - 111 mmol/L   CO2 22 22 - 32 mmol/L   Glucose, Bld 132 (H) 65 - 99 mg/dL   BUN 59 (H) 6 - 20 mg/dL   Creatinine, Ser 2.71 (H) 0.44 - 1.00 mg/dL   Calcium 8.9 8.9 - 10.3 mg/dL   GFR calc non Af Amer 16 (L) >60 mL/min   GFR calc Af Amer 18 (L) >60 mL/min    Comment: (NOTE) The eGFR has been calculated using the CKD EPI equation. This calculation has not been validated in all clinical situations. eGFR's persistently <60 mL/min signify possible Chronic Kidney Disease.    Anion gap 10 5 - 15    Comment: Performed at [facility], [address], [place], [place] [address]  Type and screen     Status: None (Preliminary result)   Collection Time: 08/07/17  2:17 AM  Result Value Ref Range   ABO/RH(D) B POS    Antibody Screen NEG    Sample Expiration 08/10/2017    Unit Number [id number]    Blood Component Type RED CELLS,LR    Unit division 00    Status of Unit ISSUED    Transfusion Status OK TO TRANSFUSE    Crossmatch Result      Compatible Performed at [facility], [address], [place], [place] [address]    Unit Number [id number]    Blood Component Type RED CELLS,LR    Unit division 00     Status of Unit ALLOCATED    Transfusion Status OK TO TRANSFUSE    Crossmatch Result Compatible   Prepare RBC     Status: None   Collection Time: 08/07/17  2:46 AM  Result Value Ref Range   Order Confirmation      ORDER PROCESSED BY BLOOD BANK Performed  at [facility], [address], [place], [place] [address]   CBG monitoring, ED     Status: Abnormal   Collection Time: 08/07/17  6:11 AM  Result Value Ref Range   Glucose-Capillary 139 (H) 65 - 99 mg/dL    No results found.  ROS Blood pressure (!) 152/70, pulse 83, temperature 97.6 F (36.4 C), temperature source Oral, resp. rate 16, weight 173 lb (78.5 kg), SpO2 100 %. Physical Exam Alert and oriented. Skin warm and dry. Oral mucosa is moist.   . Sclera anicteric, conjunctivae is pink. Thyroid not enlarged. No cervical lymphadenopathy. Lungs clear. Heart regular rate and rhythm.  Abdomen is soft. Bowel sounds are positive. No hepatomegaly. No abdominal masses felt. No tenderness.  No edema to lower extremities.      Assessment/Plan: GIB. Will discuss with Dr. Laural Golden.   Etai Copado L Cormick Moss 08/07/2017, 8:57 AM

## 2017-08-08 ENCOUNTER — Ambulatory Visit: Payer: Medicare HMO | Admitting: Nurse Practitioner

## 2017-08-08 ENCOUNTER — Inpatient Hospital Stay (HOSPITAL_COMMUNITY): Payer: Medicare HMO | Admitting: Certified Registered"

## 2017-08-08 ENCOUNTER — Encounter (HOSPITAL_COMMUNITY): Payer: Self-pay | Admitting: *Deleted

## 2017-08-08 ENCOUNTER — Encounter (HOSPITAL_COMMUNITY)
Admission: EM | Disposition: A | Discharge status: Home / Self Care | Payer: Self-pay | Source: Home / Self Care | Attending: Internal Medicine

## 2017-08-08 DIAGNOSIS — K921 Melena: Secondary | ICD-10-CM | POA: Diagnosis present

## 2017-08-08 DIAGNOSIS — I132 Hypertensive heart and chronic kidney disease with heart failure and with stage 5 chronic kidney disease, or end stage renal disease: Secondary | ICD-10-CM | POA: Diagnosis present

## 2017-08-08 DIAGNOSIS — E039 Hypothyroidism, unspecified: Secondary | ICD-10-CM | POA: Diagnosis present

## 2017-08-08 DIAGNOSIS — E785 Hyperlipidemia, unspecified: Secondary | ICD-10-CM | POA: Diagnosis present

## 2017-08-08 DIAGNOSIS — I10 Essential (primary) hypertension: Secondary | ICD-10-CM | POA: Diagnosis not present

## 2017-08-08 DIAGNOSIS — Z79890 Hormone replacement therapy (postmenopausal): Secondary | ICD-10-CM | POA: Diagnosis not present

## 2017-08-08 DIAGNOSIS — E1169 Type 2 diabetes mellitus with other specified complication: Secondary | ICD-10-CM | POA: Diagnosis present

## 2017-08-08 DIAGNOSIS — D631 Anemia in chronic kidney disease: Secondary | ICD-10-CM | POA: Diagnosis present

## 2017-08-08 DIAGNOSIS — M109 Gout, unspecified: Secondary | ICD-10-CM | POA: Diagnosis present

## 2017-08-08 DIAGNOSIS — K219 Gastro-esophageal reflux disease without esophagitis: Secondary | ICD-10-CM | POA: Diagnosis present

## 2017-08-08 DIAGNOSIS — I2510 Atherosclerotic heart disease of native coronary artery without angina pectoris: Secondary | ICD-10-CM | POA: Diagnosis present

## 2017-08-08 DIAGNOSIS — D649 Anemia, unspecified: Secondary | ICD-10-CM | POA: Diagnosis present

## 2017-08-08 DIAGNOSIS — Z794 Long term (current) use of insulin: Secondary | ICD-10-CM | POA: Diagnosis not present

## 2017-08-08 DIAGNOSIS — K449 Diaphragmatic hernia without obstruction or gangrene: Secondary | ICD-10-CM | POA: Diagnosis present

## 2017-08-08 DIAGNOSIS — Z79899 Other long term (current) drug therapy: Secondary | ICD-10-CM | POA: Diagnosis not present

## 2017-08-08 DIAGNOSIS — N185 Chronic kidney disease, stage 5: Secondary | ICD-10-CM | POA: Diagnosis present

## 2017-08-08 DIAGNOSIS — Z833 Family history of diabetes mellitus: Secondary | ICD-10-CM | POA: Diagnosis not present

## 2017-08-08 DIAGNOSIS — I5042 Chronic combined systolic (congestive) and diastolic (congestive) heart failure: Secondary | ICD-10-CM | POA: Diagnosis present

## 2017-08-08 DIAGNOSIS — I252 Old myocardial infarction: Secondary | ICD-10-CM | POA: Diagnosis not present

## 2017-08-08 DIAGNOSIS — Z888 Allergy status to other drugs, medicaments and biological substances status: Secondary | ICD-10-CM | POA: Diagnosis not present

## 2017-08-08 DIAGNOSIS — N184 Chronic kidney disease, stage 4 (severe): Secondary | ICD-10-CM | POA: Diagnosis not present

## 2017-08-08 DIAGNOSIS — K922 Gastrointestinal hemorrhage, unspecified: Secondary | ICD-10-CM | POA: Diagnosis not present

## 2017-08-08 DIAGNOSIS — K319 Disease of stomach and duodenum, unspecified: Secondary | ICD-10-CM

## 2017-08-08 DIAGNOSIS — K2951 Unspecified chronic gastritis with bleeding: Secondary | ICD-10-CM | POA: Diagnosis present

## 2017-08-08 DIAGNOSIS — Z96642 Presence of left artificial hip joint: Secondary | ICD-10-CM | POA: Diagnosis present

## 2017-08-08 DIAGNOSIS — E1122 Type 2 diabetes mellitus with diabetic chronic kidney disease: Secondary | ICD-10-CM | POA: Diagnosis present

## 2017-08-08 HISTORY — PX: ENTEROSCOPY: SHX5533

## 2017-08-08 HISTORY — PX: ESOPHAGOGASTRODUODENOSCOPY (EGD) WITH PROPOFOL: SHX5813

## 2017-08-08 LAB — CBC
HCT: 30.3 % — ABNORMAL LOW (ref 36.0–46.0)
HEMOGLOBIN: 9.6 g/dL — AB (ref 12.0–15.0)
MCH: 28.7 pg (ref 26.0–34.0)
MCHC: 31.7 g/dL (ref 30.0–36.0)
MCV: 90.7 fL (ref 78.0–100.0)
Platelets: 279 10*3/uL (ref 150–400)
RBC: 3.34 MIL/uL — AB (ref 3.87–5.11)
RDW: 17.7 % — ABNORMAL HIGH (ref 11.5–15.5)
WBC: 5.7 10*3/uL (ref 4.0–10.5)

## 2017-08-08 LAB — IRON AND TIBC
Iron: 29 ug/dL (ref 28–170)
SATURATION RATIOS: 9 % — AB (ref 10.4–31.8)
TIBC: 312 ug/dL (ref 250–450)
UIBC: 283 ug/dL

## 2017-08-08 LAB — TYPE AND SCREEN
ABO/RH(D): B POS
ANTIBODY SCREEN: NEGATIVE
UNIT DIVISION: 0
Unit division: 0

## 2017-08-08 LAB — COMPREHENSIVE METABOLIC PANEL
ALK PHOS: 46 U/L (ref 38–126)
ALT: 12 U/L — AB (ref 14–54)
ANION GAP: 10 (ref 5–15)
AST: 17 U/L (ref 15–41)
Albumin: 3.2 g/dL — ABNORMAL LOW (ref 3.5–5.0)
BUN: 53 mg/dL — ABNORMAL HIGH (ref 6–20)
CO2: 21 mmol/L — ABNORMAL LOW (ref 22–32)
CREATININE: 2.44 mg/dL — AB (ref 0.44–1.00)
Calcium: 8.8 mg/dL — ABNORMAL LOW (ref 8.9–10.3)
Chloride: 110 mmol/L (ref 101–111)
GFR, EST AFRICAN AMERICAN: 21 mL/min — AB (ref >60)
GFR, EST NON AFRICAN AMERICAN: 18 mL/min — AB (ref >60)
Glucose, Bld: 95 mg/dL (ref 65–99)
Potassium: 4.6 mmol/L (ref 3.5–5.1)
Sodium: 141 mmol/L (ref 135–145)
TOTAL PROTEIN: 5.9 g/dL — AB (ref 6.5–8.1)
Total Bilirubin: 0.8 mg/dL (ref 0.3–1.2)

## 2017-08-08 LAB — GLUCOSE, CAPILLARY
GLUCOSE-CAPILLARY: 102 mg/dL — AB (ref 65–99)
GLUCOSE-CAPILLARY: 100 mg/dL — AB (ref 65–99)
GLUCOSE-CAPILLARY: 103 mg/dL — AB (ref 65–99)
GLUCOSE-CAPILLARY: 115 mg/dL — AB (ref 65–99)
GLUCOSE-CAPILLARY: 175 mg/dL — AB (ref 65–99)
Glucose-Capillary: 119 mg/dL — ABNORMAL HIGH (ref 65–99)
Glucose-Capillary: 95 mg/dL (ref 65–99)
Glucose-Capillary: 95 mg/dL (ref 65–99)

## 2017-08-08 LAB — RETICULOCYTES
RBC.: 3.4 MIL/uL — ABNORMAL LOW (ref 3.87–5.11)
RETIC CT PCT: 5.4 % — AB (ref 0.4–3.1)
Retic Count, Absolute: 183.6 10*3/uL (ref 19.0–186.0)

## 2017-08-08 LAB — HEMOGLOBIN AND HEMATOCRIT, BLOOD
HEMATOCRIT: 30.9 % — AB (ref 36.0–46.0)
HEMATOCRIT: 31.6 % — AB (ref 36.0–46.0)
Hemoglobin: 9.8 g/dL — ABNORMAL LOW (ref 12.0–15.0)
Hemoglobin: 10.1 g/dL — ABNORMAL LOW (ref 12.0–15.0)

## 2017-08-08 LAB — BPAM RBC
Blood Product Expiration Date: 201903182359
Blood Product Expiration Date: 201903182359
ISSUE DATE / TIME: 201903130454
ISSUE DATE / TIME: 201903131204
UNIT TYPE AND RH: 1700
Unit Type and Rh: 1700

## 2017-08-08 LAB — VITAMIN B12: Vitamin B-12: 424 pg/mL (ref 180–914)

## 2017-08-08 LAB — FERRITIN: FERRITIN: 331 ng/mL — AB (ref 11–307)

## 2017-08-08 LAB — FOLATE: Folate: 46.2 ng/mL (ref >5.9)

## 2017-08-08 SURGERY — ESOPHAGOGASTRODUODENOSCOPY (EGD) WITH PROPOFOL
Anesthesia: Monitor Anesthesia Care

## 2017-08-08 MED ORDER — SODIUM CHLORIDE 0.9% FLUSH
INTRAVENOUS | Status: AC
  Filled 2017-08-08: qty 10

## 2017-08-08 MED ORDER — PROPOFOL 500 MG/50ML IV EMUL
INTRAVENOUS | Status: DC | PRN
  Administered 2017-08-08: 150 ug/kg/min via INTRAVENOUS
  Administered 2017-08-08: 15:00:00 via INTRAVENOUS

## 2017-08-08 MED ORDER — MIDAZOLAM HCL 2 MG/2ML IJ SOLN
1.0000 mg | INTRAMUSCULAR | Status: DC
  Administered 2017-08-08: 2 mg via INTRAVENOUS

## 2017-08-08 MED ORDER — FENTANYL CITRATE (PF) 100 MCG/2ML IJ SOLN
25.0000 ug | Freq: Once | INTRAMUSCULAR | Status: AC
  Administered 2017-08-08: 25 ug via INTRAVENOUS

## 2017-08-08 MED ORDER — LACTATED RINGERS IV SOLN
INTRAVENOUS | Status: DC
  Administered 2017-08-08: 14:00:00 via INTRAVENOUS

## 2017-08-08 MED ORDER — LACTATED RINGERS IV SOLN: INTRAVENOUS | Status: DC | PRN

## 2017-08-08 MED ORDER — MIDAZOLAM HCL 2 MG/2ML IJ SOLN
INTRAMUSCULAR | Status: AC
  Filled 2017-08-08: qty 2

## 2017-08-08 MED ORDER — LIDOCAINE VISCOUS 2 % MT SOLN
OROMUCOSAL | Status: AC
  Filled 2017-08-08: qty 15

## 2017-08-08 MED ORDER — SPOT INK MARKER SYRINGE KIT
PACK | SUBMUCOSAL | Status: DC | PRN
  Administered 2017-08-08: 3 mL via SUBMUCOSAL

## 2017-08-08 MED ORDER — INSULIN GLARGINE 100 UNIT/ML ~~LOC~~ SOLN
15.0000 [IU] | Freq: Every day | SUBCUTANEOUS | Status: DC
  Filled 2017-08-08: qty 0.15

## 2017-08-08 MED ORDER — LIDOCAINE VISCOUS 2 % MT SOLN
5.0000 mL | Freq: Once | OROMUCOSAL | Status: AC
  Administered 2017-08-08: 5 mL via OROMUCOSAL

## 2017-08-08 MED ORDER — PROPOFOL 10 MG/ML IV BOLUS
INTRAVENOUS | Status: AC
  Filled 2017-08-08: qty 20

## 2017-08-08 MED ORDER — SODIUM CHLORIDE 0.9 % IV SOLN: INTRAVENOUS | Status: DC

## 2017-08-08 MED ORDER — INSULIN DETEMIR 100 UNIT/ML ~~LOC~~ SOLN
15.0000 [IU] | Freq: Every day | SUBCUTANEOUS | Status: DC
  Filled 2017-08-08 (×4): qty 0.15

## 2017-08-08 MED ORDER — SPOT INK MARKER SYRINGE KIT
PACK | SUBMUCOSAL | Status: AC
  Filled 2017-08-08: qty 5

## 2017-08-08 MED ORDER — FENTANYL CITRATE (PF) 100 MCG/2ML IJ SOLN
INTRAMUSCULAR | Status: AC
  Filled 2017-08-08: qty 2

## 2017-08-08 MED ORDER — LIDOCAINE HCL (PF) 1 % IJ SOLN
INTRAMUSCULAR | Status: AC
  Filled 2017-08-08: qty 5

## 2017-08-08 NOTE — Progress Notes (Signed)
Subjective:  Reports passing black stools for 10 days. Large black stool yesterday afternoon and another last night. No abdominal pain. No N/V. Feels better after blood transfusions.   Objective: Vital signs in last 24 hours: Temp:  [97.7 F (36.5 C)-98.7 F (37.1 C)] 98.5 F (36.9 C) (03/14 0500) Pulse Rate:  [64-83] 64 (03/14 0500) Resp:  [16] 16 (03/13 1455) BP: (112-152)/(34-70) 112/39 (03/14 0500) SpO2:  [94 %-100 %] 94 % (03/14 0500) Weight:  [172 lb 3.2 oz (78.1 kg)-173 lb 1 oz (78.5 kg)] 173 lb 1 oz (78.5 kg) (03/14 0500) Last BM Date: 08/07/17 General:   Alert,  Well-developed, well-nourished, pleasant and cooperative in NAD Head:  Normocephalic and atraumatic. Eyes:  Sclera clear, no icterus.  Abdomen:  Soft, nontender and nondistended. Normal bowel sounds, without guarding, and without rebound.   Extremities:  Without clubbing, deformity or edema. Neurologic:  Alert and  oriented x4;  grossly normal neurologically. Skin:  Intact without significant lesions or rashes. Psych:  Alert and cooperative. Normal mood and affect.  Intake/Output from previous day: 03/13 0701 - 03/14 0700 In: 712.5 [Blood:712.5] Out: -  Intake/Output this shift: No intake/output data recorded.  Lab Results: CBC Recent Labs    08/06/17 1615 08/07/17 0217 08/07/17 2003 08/08/17 0355  WBC 6.8 7.8  --  5.7  HGB 8.4* 7.5* 10.5* 9.6*  HCT 26.6* 24.0* 33.1* 30.3*  MCV 94.3 95.6  --  90.7  PLT 323 320  --  279   BMET Recent Labs    08/07/17 0217 08/08/17 0355  NA 136 141  K 4.7 4.6  CL 104 110  CO2 22 21*  GLUCOSE 132* 95  BUN 59* 53*  CREATININE 2.71* 2.44*  CALCIUM 8.9 8.8*   LFTs Recent Labs    08/08/17 0355  BILITOT 0.8  ALKPHOS 46  AST 17  ALT 12*  PROT 5.9*  ALBUMIN 3.2*   No results for input(s): LIPASE in the last 72 hours. PT/INR No results for input(s): LABPROT, INR in the last 72 hours.    Imaging Studies: No results found.[2 weeks]   Assessment:  77  y/o female with CKD, DM, CAD, IDA who presents with symptomatic anemia and melena. Patient discharged on 07/31/17 after 2 units of prbcs, Hgb 9.3 at time of discharge. Outpatient bloodwork showed Hgb 8.4, in ED 7.5. She has been having black stools for 10 days. She has received additional 2 units of prbcs this admission.   She is followed by hematology, has been receiving Aranesp and has received iron infusions in the past. Hgb up to 13.4 in 05/2017. Down to 9 in less than 6 weeks. Has received 4 units of prbcs since late 05/2017.   EGD/TCS 09/2016 in [place]: mild chronic gastritis but no H.pylori, 28mm transverse colon polyp NOT resected due to Plavix, hemorrhoids.   [place] 09/2016 in [place]: no small bowel mucosal abnormalities  EGD 02/2017 by Dr. Oneida Alar: mild edema/erythema of stomach, small bowel bx with focal villous tip lymphocytosis ?partially developed Celiac.   [place] 10/2016 Dr. Oneida Alar: occasional gastric erosion. Small bowel unremarkable.   Plan: 1. Plan for EGD today with push enteroscopy.  I have discussed the risks, alternatives, benefits with regards to but not limited to the risk of reaction to medication, bleeding, infection, perforation and the patient is agreeable to proceed. Written consent to be obtained. 2. Follow up celiac serologies.   Laureen Ochs. Bernarda Caffey [facility] [phone] 3/14/20198:41 AM     LOS: 0  days

## 2017-08-08 NOTE — Progress Notes (Signed)
Order for lantus insulin noted when patient back from endoscopy this afternoon. Noted pt has documented allergy to lantus. Notified Dr. Candiss Norse. Stated nursing to let pharmacy know and have them substitute. Notified Mickel Baas, pharmD. Stated she had discussed with nursing yesterday and thought levemir had been ordered but appeared to have been discontinued. Stated MD could substitute Levemir. Notified Dr. Candiss Norse, who requested nursing place order for pharmacy to dose Levemir. Donavan Foil, RN

## 2017-08-08 NOTE — Anesthesia Postprocedure Evaluation (Signed)
Anesthesia Post Note  Patient: [name]  Procedure(s) Performed: ESOPHAGOGASTRODUODENOSCOPY (EGD) WITH PROPOFOL (N/A ) ENTEROSCOPY (N/A )  Patient location during evaluation: PACU Anesthesia Type: MAC Level of consciousness: awake and alert, oriented and patient cooperative Pain management: pain level controlled Vital Signs Assessment: post-procedure vital signs reviewed and stable Respiratory status: spontaneous breathing and respiratory function stable Cardiovascular status: stable Postop Assessment: no apparent nausea or vomiting Anesthetic complications: no     Last Vitals:  Vitals:   08/08/17 1410 08/08/17 1415  BP: (!) 139/106 (!) 130/59  Pulse:    Resp: 16 16  Temp:    SpO2: 95% 94%    Last Pain:  Vitals:   08/08/17 1311  TempSrc: Oral  PainSc:                  ADAMS, AMY A

## 2017-08-08 NOTE — Progress Notes (Signed)
Orders for hemoglobin/hematocrit every 6 hours. None resulted for this evening's lab result. Notified lab, stated they had not drawn and will send someone up. Donavan Foil, RN

## 2017-08-08 NOTE — Anesthesia Procedure Notes (Signed)
Procedure Name: MAC Date/Time: 08/08/2017 2:17 PM Performed by: Andree Elk Lavarr President A, CRNA Pre-anesthesia Checklist: Patient identified, Emergency Drugs available, Suction available, Patient being monitored and Timeout performed Oxygen Delivery Method: Simple face mask

## 2017-08-08 NOTE — Progress Notes (Signed)
Patient on clears post-endoscopy. Asked if she could have "regular food for supper". Discussed clear liquids, given some chicken broth, jello and diet ginger ale to try and discussed nursing would ask Dr. Gala Romney if diet could be advanced if she tolerates clears. Received call from Dr. Gala Romney, stated he would advance diet. Order received for carb modified diet. Donavan Foil, RN

## 2017-08-08 NOTE — Progress Notes (Signed)
Patient requested "I want to go home tonight and the doctor said I could go home if doing well after my procedure". Text-paged Dr. Candiss Norse to notify of her request concerning he . Stated patient should stay overnight for continued monitoring of hemoglobins and they will reassess her tomorrow. Discussed with patient. Donavan Foil, RN

## 2017-08-08 NOTE — Anesthesia Preprocedure Evaluation (Signed)
Anesthesia Evaluation  Patient identified by MRN, date of birth, ID band Patient awake    Reviewed: Allergy & Precautions, NPO status , Patient's Chart, lab work & pertinent test results  Airway Mallampati: III  TM Distance: <3 FB Neck ROM: Full    Dental  (+) Edentulous Upper, Partial Lower   Pulmonary neg pulmonary ROS,    Pulmonary exam normal        Cardiovascular hypertension, Pt. on medications + Past MI and +CHF  Normal cardiovascular exam     Neuro/Psych negative neurological ROS     GI/Hepatic GERD  ,  Endo/Other  diabetes, Type 2, Insulin Dependent, Oral Hypoglycemic AgentsHypothyroidism   Renal/GU CRFRenal disease     Musculoskeletal   Abdominal   Peds  Hematology  (+) Blood dyscrasia, anemia ,   Anesthesia Other Findings   Reproductive/Obstetrics                             Anesthesia Physical Anesthesia Plan  ASA: III  Anesthesia Plan: MAC   Post-op Pain Management:    Induction: Intravenous  PONV Risk Score and Plan:   Airway Management Planned: Simple Face Mask  Additional Equipment:   Intra-op Plan:   Post-operative Plan:   Informed Consent: I have reviewed the patients History and Physical, chart, labs and discussed the procedure including the risks, benefits and alternatives for the proposed anesthesia with the patient or authorized representative who has indicated his/her understanding and acceptance.     Plan Discussed with:   Anesthesia Plan Comments:         Anesthesia Quick Evaluation

## 2017-08-08 NOTE — Transfer of Care (Signed)
Immediate Anesthesia Transfer of Care Note  Patient: [name]  Procedure(s) Performed: ESOPHAGOGASTRODUODENOSCOPY (EGD) WITH PROPOFOL (N/A ) ENTEROSCOPY (N/A )  Patient Location: PACU  Anesthesia Type:MAC  Level of Consciousness: awake, alert , oriented and patient cooperative  Airway & Oxygen Therapy: Patient Spontanous Breathing  Post-op Assessment: Report given to RN and Post -op Vital signs reviewed and stable  Post vital signs: Reviewed and stable  Last Vitals:  Vitals:   08/08/17 1410 08/08/17 1415  BP: (!) 139/106 (!) 130/59  Pulse:    Resp: 16 16  Temp:    SpO2: 95% 94%    Last Pain:  Vitals:   08/08/17 1311  TempSrc: Oral  PainSc:       Patients Stated Pain Goal: 5 (33/58/25 1898)  Complications: No apparent anesthesia complications

## 2017-08-08 NOTE — Op Note (Signed)
[facility] Patient Name: [name] Procedure Date: 08/08/2017 2:01 PM MRN: [id number] Date of Birth: 28-Sep-1940 Attending MD: Norvel Richards , MD CSN: [id number] Age: 77 Admit Type: Outpatient Procedure:                Upper GI endoscopy Indications:              Melena Providers:                Norvel Richards, MD, Jeanann Lewandowsky. Ronya Seller, RN,                            Randa Spike, Technician Referring MD:              Medicines:                Propofol per Anesthesia Complications:            No immediate complications. Estimated Blood Loss:     Estimated blood loss was minimal. Procedure:                Pre-Anesthesia Assessment:                           - Prior to the procedure, a History and Physical                            was performed, and patient medications and                            allergies were reviewed. The patient's tolerance of                            previous anesthesia was also reviewed. The risks                            and benefits of the procedure and the sedation                            options and risks were discussed with the patient.                            All questions were answered, and informed consent                            was obtained. Prior Anticoagulants: The patient has                            taken no previous anticoagulant or antiplatelet                            agents. ASA Grade Assessment: II - A patient with                            mild systemic disease. After reviewing the risks  and benefits, the patient was deemed in                            satisfactory condition to undergo the procedure.                           After obtaining informed consent, the endoscope was                            passed under direct vision. Throughout the                            procedure, the patient's blood pressure, pulse, and                            oxygen saturations  were monitored continuously. The                            [id number]-[id number] ([id number]) was introduced through the and                            advanced to the mid-jejunum. The upper GI endoscopy                            was accomplished without difficulty. The patient                            tolerated the procedure well. Scope In: 2:24:38 PM Scope Out: 2:53:59 PM Total Procedure Duration: 0 hours 29 minutes 21 seconds  Findings:      The examined esophagus was normal.      A small hiatal hernia was present.      The duodenal bulb, second portion of the duodenum, third portion of the       duodenum and fourth portion of the duodenum were normal. No blood in the       stomach. The pediatric colonoscope was advanced well into the jejunum.       The mucosa initially appeared normal with no blood in this segment of       the GI tract. I slowly started withdrawing the scope. In the area of the       junction between the fourth portion of the duodenum and the proximal       jejunum, there was a focal area of mucosal oozing noted with a 5 mm area       of underlying vascular blush. I gently touched the tip of the scope       against this lesion and brisk bleeding ensued. I subsequently sealed       this area with 4 hemostasis clips. Finally, I marked the spot with Ink. Impression:               - Normal esophagus.                           - Small hiatal hernia.                           -  Normal duodenal bulb, second portion of the                            duodenum, third portion of the duodenum and fourth                            portion of the duodenum. 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 Sealed with hemostasis clips. Lesion                            tattooed.                           - No specimens  collected. Moderate Sedation:      Moderate (conscious) sedation was personally administered by an       anesthesia professional. The following parameters were monitored: oxygen       saturation, heart rate, blood pressure, respiratory rate, EKG, adequacy       of pulmonary ventilation, and response to care. Total physician       intraservice time was 33 minutes. Recommendation:           - Return patient to hospital ward for ongoing care.                            Clear liquid diet. Follow H&H. No future MRI until                            clips proven to no longer be present.                           - Clear liquid diet.                           - Continue present medications. Procedure Code(s):        --- Professional ---                           [id number], Esophagogastroduodenoscopy, flexible,                            transoral; diagnostic, including collection of                            specimen(s) by brushing or washing, when performed                            (separate procedure) Diagnosis Code(s):        ---  Professional ---                           K44.9, Diaphragmatic hernia without obstruction or                            gangrene                           K92.1, Melena (includes Hematochezia) CPT copyright 2016 [employer]. All rights reserved. The codes documented in this report are preliminary and upon coder review may  be revised to meet current compliance requirements. Cristopher Estimable. Rourk, MD Norvel Richards, MD 08/08/2017 3:13:15 PM This report has been signed electronically. Number of Addenda: 0

## 2017-08-08 NOTE — Progress Notes (Signed)
Nutrition Brief Note  Patient identified on the Malnutrition Screening Tool (MST) Report. She had reported weight loss and poor intake due to decreased appetite  Wt Readings from Last 15 Encounters:  08/08/17 173 lb 1 oz (78.5 kg)  08/06/17 173 lb (78.5 kg)  08/02/17 174 lb 4.8 oz (79.1 kg)  07/30/17 166 lb 10.7 oz (75.6 kg)  06/24/17 178 lb (80.7 kg)  06/19/17 178 lb 9.6 oz (81 kg)  06/18/17 177 lb (80.3 kg)  05/22/17 177 lb 11.2 oz (80.6 kg)  05/20/17 181 lb (82.1 kg)  03/26/17 180 lb (81.6 kg)  03/19/17 181 lb (82.1 kg)  03/10/17 181 lb 10.5 oz (82.4 kg)  02/14/17 180 lb (81.6 kg)  02/13/17 183 lb 3.2 oz (83.1 kg)  01/18/17 185 lb 9.6 oz (84.2 kg)   Body mass index is 29.71 kg/m. Patient meets criteria for overweight based on current BMI.   Patient today contradicts MST. She says her appetite is "something I havent lost". She always eat well, including since she has been dealing with the anemia.   Regarding her weight, she says the weight loss was mostly intentional. She says she moved from [place] 1 year ago and at that time she was 207 lbs. She has purchased a treadmill at her new house and was exercising until she hurt her knee. She has also been more active outside since it is not as hot here.   Patient is planned for EGD shortly. She is quite hungry and RD took her requests for post-procedure dinner.   No nutrition interventions warranted at this time. If nutrition issues arise, please consult RD.   Burtis Junes RD, LDN, CNSC Clinical Nutrition Pager: [phone] 08/08/2017 12:49 PM

## 2017-08-08 NOTE — Progress Notes (Signed)
@IPLOG @        PROGRESS NOTE                                                                                                                                                                                                             Patient Demographics:    [name], is a 77 y.o. female, DOB - 02-27-1941, YFV:[id number]  Admit date - 08/07/2017   Admitting Physician Oswald Hillock, MD  Outpatient Primary MD for the patient is Caren Macadam, MD  LOS - 0  Chief Complaint  Patient presents with  . Abnormal Lab       Brief Narrative   [name]  is a 77 y.o. female, with history of GI bleed, severe anemia, CKD stage IV, diabetes mellitus, hypertension, hyperlipidemia who was recently discharged from the hospital a week ago, After she received blood transfusion fo severe symptomatic anemia.  Patient has had extensive GI workup including EGD, colonoscopy, capsule endoscopy which has been unremarkable, he was seen by GI again and is now scheduled for a EGD and push enteroscopy later today.   Subjective:    [name] today has, No headache, No chest pain, No abdominal pain - No Nausea, No new weakness tingling or numbness, No Cough - SOB.  Had a large black bowel movement late night yesterday.   Assessment  & Plan :     1.  Most likely upper GI bleed induced acute on chronic iron deficiency anemia.  At this time does not require transfusion, continue PPI, will check anemia panel and if needed IV iron will be given, H&H will be monitored every 6 hours, transfuse if hemoglobin around 7.5, GI on board and planning to do EGD and push enteroscopy later today.    2.  Hypertension.  Currently stable on beta-blocker Norvasc continue.  3.  DM type II.  Cut Lantus dose in half as she is n.p.o. for EGD, sliding scale q. before meals, no at bedtime coverage for now.  Monitor CBGs and adjust.  4.  Hypohyroidism.  Continue home dose Synthroid.  5.  CKD 5 baseline creatinine is close to  2.5.  Continue to monitor.  6.  History of CAD.  Currently no acute issues continue beta-blocker for secondary prevention, cannot use aspirin due to #1 above.     Diet : Diet NPO time specified    Family Communication  :  None  Code Status :  Full  Disposition Plan  :  TBD  Consults  :  GI  Procedures  :    EGD  DVT Prophylaxis  :   SCDs    Lab Results  Component Value Date   PLT 279 08/08/2017    Inpatient Medications  Scheduled Meds: . amLODipine  10 mg Oral Daily  . docusate sodium  100 mg Oral Daily  . feeding supplement (ENSURE ENLIVE)  237 mL Oral BID BM  . gabapentin  600 mg Oral QHS  . insulin aspart  0-9 Units Subcutaneous TID WC  . insulin glargine  24 Units Subcutaneous QHS  . levothyroxine  50 mcg Oral QAC breakfast  . metoprolol tartrate  12.5 mg Oral BID  . pantoprazole  40 mg Oral BID  . sodium chloride flush  3 mL Intravenous Q12H   Continuous Infusions: . sodium chloride     PRN Meds:.sodium chloride, acetaminophen, ondansetron, sodium chloride flush  Antibiotics  :    Anti-infectives (From admission, onward)   None         Objective:   Vitals:   08/07/17 1228 08/07/17 1455 08/07/17 2023 08/08/17 0500  BP: (!) 133/42 (!) 114/34 139/67 (!) 112/39  Pulse: 68 66 71 64  Resp: 16 16    Temp: 97.7 F (36.5 C) 98.5 F (36.9 C) 98.5 F (36.9 C) 98.5 F (36.9 C)  TempSrc: Oral Oral Oral Oral  SpO2: 100% 99% 100% 94%  Weight:    78.5 kg (173 lb 1 oz)  Height:        Wt Readings from Last 3 Encounters:  08/08/17 78.5 kg (173 lb 1 oz)  08/06/17 78.5 kg (173 lb)  08/02/17 79.1 kg (174 lb 4.8 oz)     Intake/Output Summary (Last 24 hours) at 08/08/2017 1010 Last data filed at 08/07/2017 1455 Gross per 24 hour  Intake 397.5 ml  Output -  Net 397.5 ml     Physical Exam  Awake Alert, Oriented X 3, No new F.N deficits, Normal affect [place].AT,PERRAL Supple Neck,No JVD, No cervical lymphadenopathy appriciated.  Symmetrical Chest  wall movement, Good air movement bilaterally, CTAB RRR,No Gallops,Rubs or new Murmurs, No Parasternal Heave +ve B.Sounds, Abd Soft, No tenderness, No organomegaly appriciated, No rebound - guarding or rigidity. No Cyanosis, Clubbing or edema, No new Rash or bruise       Data Review:    CBC Recent Labs  Lab 08/02/17 0919 08/06/17 1615 08/07/17 0217 08/07/17 2003 08/08/17 0355  WBC 5.5 6.8 7.8  --  5.7  HGB 9.3* 8.4* 7.5* 10.5* 9.6*  HCT 28.6* 26.6* 24.0* 33.1* 30.3*  PLT 293 323 320  --  279  MCV 92.0 94.3 95.6  --  90.7  MCH 29.9 29.8 29.9  --  28.7  MCHC 32.5 31.6 31.3  --  31.7  RDW 15.6* 16.6* 16.3*  --  17.7*  LYMPHSABS 1.0 1.4 1.3  --   --   MONOABS 0.4 0.3 0.5  --   --   EOSABS 0.3 0.4 0.5  --   --   BASOSABS 0.0 0.1 0.0  --   --     Chemistries  Recent Labs  Lab 08/02/17 0919 08/07/17 0217 08/08/17 0355  NA 140 136 141  K 5.5* 4.7 4.6  CL 107 104 110  CO2 24 22 21*  GLUCOSE 176* 132* 95  BUN 70* 59* 53*  CREATININE 2.50* 2.71* 2.44*  CALCIUM 9.2 8.9 8.8*  AST 17  --  17  ALT 14  --  12*  ALKPHOS 54  --  46  BILITOT 0.5  --  0.8   ------------------------------------------------------------------------------------------------------------------ No results for input(s): CHOL, HDL, LDLCALC, TRIG, CHOLHDL, LDLDIRECT in the last 72 hours.  Lab Results  Component Value Date   HGBA1C 7.3 (H) 06/12/2017   ------------------------------------------------------------------------------------------------------------------ No results for input(s): TSH, T4TOTAL, T3FREE, THYROIDAB in the last 72 hours.  Invalid input(s): FREET3 ------------------------------------------------------------------------------------------------------------------ No results for input(s): VITAMINB12, FOLATE, FERRITIN, TIBC, IRON, RETICCTPCT in the last 72 hours.  Coagulation profile No results for input(s): INR, PROTIME in the last 168 hours.  No results for input(s): DDIMER in the  last 72 hours.  Cardiac Enzymes No results for input(s): CKMB, TROPONINI, MYOGLOBIN in the last 168 hours.  Invalid input(s): CK   Micro Results No results found for this or any previous visit (from the past 240 hour(s)).  Radiology Reports No results found.  Time Spent in minutes  30   Lala Lund M.D on 08/08/2017 at 10:10 AM  Between 7am to 7pm - Pager - [phone] ( page via [email or web address], text pages only, please mention full 10 digit call back number). After 7pm go to [email or web address] - password [email or web address]

## 2017-08-09 ENCOUNTER — Encounter (HOSPITAL_COMMUNITY): Payer: Self-pay | Admitting: Internal Medicine

## 2017-08-09 ENCOUNTER — Telehealth: Payer: Self-pay | Admitting: Nurse Practitioner

## 2017-08-09 DIAGNOSIS — E039 Hypothyroidism, unspecified: Secondary | ICD-10-CM

## 2017-08-09 DIAGNOSIS — K922 Gastrointestinal hemorrhage, unspecified: Secondary | ICD-10-CM

## 2017-08-09 DIAGNOSIS — I10 Essential (primary) hypertension: Secondary | ICD-10-CM

## 2017-08-09 DIAGNOSIS — E1122 Type 2 diabetes mellitus with diabetic chronic kidney disease: Secondary | ICD-10-CM

## 2017-08-09 DIAGNOSIS — N184 Chronic kidney disease, stage 4 (severe): Secondary | ICD-10-CM

## 2017-08-09 LAB — BASIC METABOLIC PANEL
ANION GAP: 9 (ref 5–15)
BUN: 53 mg/dL — AB (ref 6–20)
CO2: 22 mmol/L (ref 22–32)
Calcium: 8.7 mg/dL — ABNORMAL LOW (ref 8.9–10.3)
Chloride: 109 mmol/L (ref 101–111)
Creatinine, Ser: 2.48 mg/dL — ABNORMAL HIGH (ref 0.44–1.00)
GFR calc Af Amer: 20 mL/min — ABNORMAL LOW (ref >60)
GFR, EST NON AFRICAN AMERICAN: 18 mL/min — AB (ref >60)
Glucose, Bld: 124 mg/dL — ABNORMAL HIGH (ref 65–99)
POTASSIUM: 4.7 mmol/L (ref 3.5–5.1)
SODIUM: 140 mmol/L (ref 135–145)

## 2017-08-09 LAB — GLUCOSE, CAPILLARY
Glucose-Capillary: 103 mg/dL — ABNORMAL HIGH (ref 65–99)
Glucose-Capillary: 187 mg/dL — ABNORMAL HIGH (ref 65–99)

## 2017-08-09 LAB — HEMOGLOBIN AND HEMATOCRIT, BLOOD
HCT: 28.4 % — ABNORMAL LOW (ref 36.0–46.0)
HEMATOCRIT: 31.9 % — AB (ref 36.0–46.0)
HEMOGLOBIN: 9.2 g/dL — AB (ref 12.0–15.0)
HEMOGLOBIN: 10.2 g/dL — AB (ref 12.0–15.0)

## 2017-08-09 LAB — IGA: IGA: 96 mg/dL (ref 64–422)

## 2017-08-09 NOTE — Addendum Note (Signed)
Addendum  created 08/09/17 0752 by Mickel Baas, CRNA   Sign clinical note

## 2017-08-09 NOTE — Care Management Important Message (Signed)
Important Message  Patient Details  Name: QUENTINA FRONEK MRN: [id number] Date of Birth: Oct 18, 1940   Medicare Important Message Given:  Yes    Sherald Barge, RN 08/09/2017, 12:58 PM

## 2017-08-09 NOTE — Telephone Encounter (Signed)
Please schedule 6-8 week hosp f/u appointment with AB, LSL, or myself.

## 2017-08-09 NOTE — Discharge Summary (Signed)
Physician Discharge Summary  [name] JOA:[id number] DOB: [birth year] DOA: 08/07/2017  PCP: Caren Macadam, MD  Admit date: 08/07/2017 Discharge date: 08/09/2017  Admitted From: Home Disposition: Home  Recommendations for Outpatient Follow-up:  1. Follow up with PCP in 1-2 weeks 2. Please obtain BMP/CBC in one week 3. Patient will be scheduled for outpatient follow-up with GI 4. Follow-up with hematology as previous scheduled  Home Health: Equipment/Devices:  Discharge Condition: Stable CODE STATUS: Full code Diet recommendation: Heart Healthy / Carb Modified   Brief/Interim Summary: 77 year old female with a history of chronic kidney disease stage IV, diabetes, hypertension, anemia of chronic disease previously on Aranesp, presents to the hospital with dark stools and worsening anemia.  Found to have GI bleeding.  Patient was seen by gastroenterology and underwent EGD with push enteroscopy.  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  Her hemoglobin has been stable.  She was transfused 2 units of PRBC on admission, but since then hemoglobin has stabilized.  Hemodynamics have been stable.  She was also seen by hematology who agreed that worsening anemia likely related to GI bleeding.  She recently received a dose of Aranesp and will follow up with hematology as an outpatient.  Since the patient is better, she is requesting discharge home.  She will be continued on proton pump inhibitors and has been advised to abstain from any NSAID use.  The remainder of her chronic medical medical issues including chronic kidney disease and diabetes remained stable in the hospital.  Patient is otherwise stable for discharge home.  Discharge Diagnoses:  Active Problems:   Diabetes mellitus with stage 4 chronic kidney disease (HCC)   Chronic kidney disease, stage IV (severe) (HCC)   Hyperlipidemia associated with type 2 diabetes mellitus ([place])   Essential hypertension, benign    Hypothyroidism   GI bleed   Anemia    Discharge Instructions  Discharge Instructions    Diet - low sodium heart healthy   Complete by:  As directed    Increase activity slowly   Complete by:  As directed      Allergies as of 08/09/2017      Reactions   Insulin Glargine Swelling   "Makes me swell like a balloon all over", including face, but without any respiratory distress or rashes. Associated with weight gain.   Statins Other (See Comments)   "I've tried them all; my muscle aches were so bad I couldn't walk".      Medication List    TAKE these medications   acetaminophen 325 MG tablet Commonly known as:  TYLENOL Take 2 tablets (650 mg total) by mouth every 6 (six) hours as needed for mild pain or headache (or Fever >/= 101).   amLODipine 10 MG tablet Commonly known as:  NORVASC Take 1 tablet (10 mg total) by mouth daily.   docusate sodium 100 MG capsule Commonly known as:  COLACE Take 1 capsule (100 mg total) by mouth every 12 (twelve) hours. What changed:  when to take this   furosemide 20 MG tablet Commonly known as:  LASIX Take 20 mg by mouth as needed. Only has to take it when she gains 2 lbs   gabapentin 300 MG capsule Commonly known as:  NEURONTIN Take 2 capsules (600 mg total) by mouth at bedtime.   hydrALAZINE 25 MG tablet Commonly known as:  APRESOLINE TAKE 1 TABLET BY MOUTH EVERY 8 HOURS AS NEEDED   Insulin Glargine 300 UNIT/ML Sopn Commonly known as:  TOUJEO MAX  SOLOSTAR Inject 20 Units at bedtime into the skin. What changed:  how much to take   levothyroxine 50 MCG tablet Commonly known as:  SYNTHROID, LEVOTHROID TAKE 1 TABLET BY MOUTH ONCE DAILY BEFORE  BREAKFAST   metoprolol tartrate 25 MG tablet Commonly known as:  LOPRESSOR Take 12.5 mg by mouth 2 (two) times daily.   multivitamin with minerals Tabs tablet Take 1 tablet by mouth daily.   ondansetron 4 MG tablet Commonly known as:  ZOFRAN Take 1 tablet (4 mg total) by mouth every 8  (eight) hours as needed for nausea or vomiting.   pantoprazole 40 MG tablet Commonly known as:  PROTONIX Take 1 tablet (40 mg total) by mouth 2 (two) times daily. 30 minutes before breakfast      [place] On 08/16/2017.   Why:  at 11:20 am for labs. Dr. Walden Field at 11:50 am. Contact information: [place] [address] [phone]         Allergies  Allergen Reactions  . Insulin Glargine Swelling    "Makes me swell like a balloon all over", including face, but without any respiratory distress or rashes. Associated with weight gain.  . Statins Other (See Comments)    "I've tried them all; my muscle aches were so bad I couldn't walk".    Consultations:  Gastroenterology  Hematology   Procedures/Studies: EGD:- Normal esophagus.                           - Small hiatal hernia.                           - Normal duodenal bulb, second portion of the                            duodenum, third portion of the duodenum and fourth                            portion of the duodenum. 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 Sealed with hemostasis clips. Lesion                            tattooed.                           - No specimens collected.     Subjective: Patient is feeling better today.  Denies any dizziness or lightheadedness.  Wants to go home.  Discharge Exam: Vitals:   08/08/17 2044 08/09/17 0503  BP: (!) 111/49 (!) 126/52  Pulse: 73 67  Resp: 16 16  Temp: 98.4 F (36.9 C) 98.4 F (36.9 C)  SpO2: 98% 97%   Vitals:  08/08/17 1528 08/08/17 1537 08/08/17 2044 08/09/17 0503  BP: (!) 142/59 (!) 149/61 (!) 111/49 (!) 126/52  Pulse: 66 65 73 67  Resp: 11 16 16 16   Temp:  98.4 F (36.9 C) 98.4 F (36.9 C) 98.4 F (36.9 C)   TempSrc:   Oral Oral  SpO2: 94% 100% 98% 97%  Weight:      Height:        General: Pt is alert, awake, not in acute distress Cardiovascular: RRR, S1/S2 +, no rubs, no gallops Respiratory: CTA bilaterally, no wheezing, no rhonchi Abdominal: Soft, NT, ND, bowel sounds + Extremities: no edema, no cyanosis    The results of significant diagnostics from this hospitalization (including imaging, microbiology, ancillary and laboratory) are listed below for reference.     Microbiology: No results found for this or any previous visit (from the past 240 hour(s)).   Labs: BNP (last 3 results) Recent Labs    03/08/17 2321  BNP 4,431.5*   Basic Metabolic Panel: Recent Labs  Lab 08/07/17 0217 08/08/17 0355 08/09/17 0233  NA 136 141 140  K 4.7 4.6 4.7  CL 104 110 109  CO2 22 21* 22  GLUCOSE 132* 95 124*  BUN 59* 53* 53*  CREATININE 2.71* 2.44* 2.48*  CALCIUM 8.9 8.8* 8.7*   Liver Function Tests: Recent Labs  Lab 08/08/17 0355  AST 17  ALT 12*  ALKPHOS 46  BILITOT 0.8  PROT 5.9*  ALBUMIN 3.2*   No results for input(s): LIPASE, AMYLASE in the last 168 hours. No results for input(s): AMMONIA in the last 168 hours. CBC: Recent Labs  Lab 08/06/17 1615 08/07/17 0217  08/08/17 0355 08/08/17 1101 08/08/17 2025 08/09/17 0233 08/09/17 0844  WBC 6.8 7.8  --  5.7  --   --   --   --   NEUTROABS 4.6 5.5  --   --   --   --   --   --   HGB 8.4* 7.5*   < > 9.6* 9.8* 10.1* 9.2* 10.2*  HCT 26.6* 24.0*   < > 30.3* 30.9* 31.6* 28.4* 31.9*  MCV 94.3 95.6  --  90.7  --   --   --   --   PLT 323 320  --  279  --   --   --   --    < > = values in this interval not displayed.   Cardiac Enzymes: No results for input(s): CKTOTAL, CKMB, CKMBINDEX, TROPONINI in the last 168 hours. BNP: Invalid input(s): POCBNP CBG: Recent Labs  Lab 08/08/17 1511 08/08/17 1624 08/08/17 2014 08/09/17 0752 08/09/17 1133  GLUCAP 103* 115* 175* 103* 187*   D-Dimer No results for input(s): DDIMER  in the last 72 hours. Hgb A1c No results for input(s): HGBA1C in the last 72 hours. Lipid Profile No results for input(s): CHOL, HDL, LDLCALC, TRIG, CHOLHDL, LDLDIRECT in the last 72 hours. Thyroid function studies No results for input(s): TSH, T4TOTAL, T3FREE, THYROIDAB in the last 72 hours.  Invalid input(s): FREET3 Anemia work up Recent Labs    08/08/17 1101  VITAMINB12 424  FOLATE 46.2  FERRITIN 331*  TIBC 312  IRON 29  RETICCTPCT 5.4*   Urinalysis    Component Value Date/Time   COLORURINE YELLOW 03/09/2017 0801   APPEARANCEUR CLEAR 03/09/2017 0801   LABSPEC 1.013 03/09/2017 0801   PHURINE 5.0 03/09/2017 0801   GLUCOSEU 50 (A) 03/09/2017 0801   HGBUR NEGATIVE 03/09/2017 0801   BILIRUBINUR NEGATIVE 03/09/2017 0801  KETONESUR NEGATIVE 03/09/2017 0801   PROTEINUR 30 (A) 03/09/2017 0801   UROBILINOGEN 0.2 02/08/2015 1615   NITRITE NEGATIVE 03/09/2017 0801   LEUKOCYTESUR NEGATIVE 03/09/2017 0801   Sepsis Labs Invalid input(s): PROCALCITONIN,  WBC,  LACTICIDVEN Microbiology No results found for this or any previous visit (from the past 240 hour(s)).   Time coordinating discharge: Over 30 minutes  SIGNED:   Kathie Dike, MD  [facility] 08/09/2017, 12:50 PM Pager   If 7PM-7AM, please contact night-coverage [email or web address] Password [email or web address]

## 2017-08-09 NOTE — Progress Notes (Signed)
Patient is to be discharged home and in stable condition. Patient's IV removed, WNL. Patient given discharge instructions and verbalized understanding. Patient made aware than she can not have a MRI due to clip placement. Patient awaiting transportation and will be escorted out by staff via wheelchair upon arrival.  Celestia Khat, RN

## 2017-08-09 NOTE — Progress Notes (Signed)
Subjective: Feels great today. No noted repeat bleeding. No GI symptoms today.  Objective: Vital signs in last 24 hours: Temp:  [97.9 F (36.6 C)-98.4 F (36.9 C)] 98.4 F (36.9 C) (03/15 0503) Pulse Rate:  [65-73] 67 (03/15 0503) Resp:  [10-19] 16 (03/15 0503) BP: (92-149)/(49-106) 126/52 (03/15 0503) SpO2:  [91 %-100 %] 97 % (03/15 0503) Weight:  [173 lb (78.5 kg)] 173 lb (78.5 kg) (03/14 1311) Last BM Date: 08/07/17 General:   Alert and oriented, pleasant Eyes:  No icterus, sclera clear. Conjuctiva pink.  Heart:  S1, S2 present, no murmurs noted.  Lungs: Clear to auscultation bilaterally, without wheezing, rales, or rhonchi.  Abdomen:  Bowel sounds present, soft, non-tender, non-distended. No HSM or hernias noted. No rebound or guarding. No masses appreciated  Msk:  Symmetrical without gross deformities. Extremities:  Without clubbing or edema. Neurologic:  Alert and  oriented x4;  grossly normal neurologically. Skin:  Warm and dry, intact without significant lesions.   Intake/Output from previous day: 03/14 0701 - 03/15 0700 In: 1390 [P.O.:840; I.V.:550] Out: 451 [Urine:451] Intake/Output this shift: No intake/output data recorded.  Lab Results: Recent Labs    08/06/17 1615 08/07/17 0217  08/08/17 0355 08/08/17 1101 08/08/17 2025 08/09/17 0233  WBC 6.8 7.8  --  5.7  --   --   --   HGB 8.4* 7.5*   < > 9.6* 9.8* 10.1* 9.2*  HCT 26.6* 24.0*   < > 30.3* 30.9* 31.6* 28.4*  PLT 323 320  --  279  --   --   --    < > = values in this interval not displayed.   BMET Recent Labs    08/07/17 0217 08/08/17 0355 08/09/17 0233  NA 136 141 140  K 4.7 4.6 4.7  CL 104 110 109  CO2 22 21* 22  GLUCOSE 132* 95 124*  BUN 59* 53* 53*  CREATININE 2.71* 2.44* 2.48*  CALCIUM 8.9 8.8* 8.7*   LFT Recent Labs    08/08/17 0355  PROT 5.9*  ALBUMIN 3.2*  AST 17  ALT 12*  ALKPHOS 46  BILITOT 0.8   PT/INR No results for input(s): LABPROT, INR in the last 72  hours. Hepatitis Panel No results for input(s): HEPBSAG, HCVAB, HEPAIGM, HEPBIGM in the last 72 hours.   Studies/Results: No results found.  Assessment: 77 y/o female with CKD, DM, CAD, IDA who presents with symptomatic anemia and melena. Patient discharged on 07/31/17 after 2 units of prbcs, Hgb 9.3 at time of discharge. Outpatient bloodwork showed Hgb 8.4, in ED 7.5. She has been having black stools for 10 days. She has received additional 2 units of prbcs this admission.   She is followed by hematology, has been receiving Aranesp and has received iron infusions in the past. Hgb up to 13.4 in 05/2017. Down to 9 in less than 6 weeks. Has received 4 units of prbcs since late 05/2017.   EGD/TCS 09/2016 in [place]: mild chronic gastritis but no H.pylori, 24mm transverse colon polyp NOT resected due to Plavix, hemorrhoids.   [place] 09/2016 in [place]: no small bowel mucosal abnormalities  EGD 02/2017 by Dr. Oneida Alar: mild edema/erythema of stomach, small bowel bx with focal villous tip lymphocytosis ?partially developed Celiac.   [place] 10/2016 Dr. Oneida Alar: occasional gastric erosion. Small bowel unremarkable.   The patient underwent EGD with push enteroscopy yesterday.  Findings include normal esophagus, small hiatal hernia.  Normal duodenal bulb, second portion of the duodenum, portion of the duodenum,  and fourth portion of the duodenum.  Abnormal small bowel junction of duodenum and jejunum noted with active bleeding likely represented a Dieulafoy lesion which was healed with hemostasis clips and tattooed.  Recommended clear liquid diet, continue medications, follow H&H.  No future MRI until clips proven to no longer be present.  Today she is clinically improved. I reviewed the EGD results with her she verbalized understanding.  Denies abdominal pain or any further GI bleed.  Hemoglobin this morning shows improvement to 10.2 from 9.2 yesterday.  Potentially can anticipate some "washout"  melena.  At this point she is essentially asymptomatic from a GI standpoint.   Plan: 1. Anticipate discharge in the next 24 hours 2. Continue PPI at home 3. We will schedule a follow-up appointment in our office for the patient in 6-8 weeks 4. She is to call our office if she notes any further bleeding 5. We will sign off for now.   Thank you for allowing [place] to participate in the care of [place], DNP, AGNP-C Adult & Gerontological Nurse Practitioner [facility]     LOS: 1 day    08/09/2017, 8:32 AM

## 2017-08-09 NOTE — Anesthesia Postprocedure Evaluation (Signed)
Anesthesia Post Note  Patient: [name]  Procedure(s) Performed: ESOPHAGOGASTRODUODENOSCOPY (EGD) WITH PROPOFOL (N/A ) ENTEROSCOPY (N/A )  Patient location during evaluation: Nursing Unit Anesthesia Type: MAC Level of consciousness: awake and alert, oriented and patient cooperative Pain management: pain level controlled Vital Signs Assessment: post-procedure vital signs reviewed and stable Respiratory status: spontaneous breathing Cardiovascular status: stable Postop Assessment: no apparent nausea or vomiting Anesthetic complications: no     Last Vitals:  Vitals:   08/08/17 2044 08/09/17 0503  BP: (!) 111/49 (!) 126/52  Pulse: 73 67  Resp: 16 16  Temp: 36.9 C 36.9 C  SpO2: 98% 97%    Last Pain:  Vitals:   08/09/17 0503  TempSrc: Oral  PainSc:                  Deloras Reichard A

## 2017-08-10 LAB — TISSUE TRANSGLUTAMINASE, IGA: Tissue Transglutaminase Ab, IgA: <2 U/mL (ref 0–3)

## 2017-08-12 ENCOUNTER — Encounter: Payer: Self-pay | Admitting: Gastroenterology

## 2017-08-13 ENCOUNTER — Ambulatory Visit: Payer: Medicare HMO | Admitting: Gastroenterology

## 2017-08-16 ENCOUNTER — Inpatient Hospital Stay (HOSPITAL_BASED_OUTPATIENT_CLINIC_OR_DEPARTMENT_OTHER): Payer: Medicare HMO | Admitting: Internal Medicine

## 2017-08-16 ENCOUNTER — Inpatient Hospital Stay (HOSPITAL_COMMUNITY): Payer: Medicare HMO

## 2017-08-16 ENCOUNTER — Encounter (HOSPITAL_COMMUNITY): Payer: Self-pay | Admitting: Internal Medicine

## 2017-08-16 ENCOUNTER — Other Ambulatory Visit (HOSPITAL_COMMUNITY): Payer: Self-pay

## 2017-08-16 ENCOUNTER — Other Ambulatory Visit: Payer: Self-pay

## 2017-08-16 VITALS — BP 145/51 | HR 62 | Temp 98.1°F | Resp 18 | Wt 177.0 lb

## 2017-08-16 DIAGNOSIS — E875 Hyperkalemia: Secondary | ICD-10-CM | POA: Diagnosis not present

## 2017-08-16 DIAGNOSIS — K219 Gastro-esophageal reflux disease without esophagitis: Secondary | ICD-10-CM | POA: Diagnosis not present

## 2017-08-16 DIAGNOSIS — E785 Hyperlipidemia, unspecified: Secondary | ICD-10-CM

## 2017-08-16 DIAGNOSIS — Z794 Long term (current) use of insulin: Secondary | ICD-10-CM | POA: Diagnosis not present

## 2017-08-16 DIAGNOSIS — K922 Gastrointestinal hemorrhage, unspecified: Secondary | ICD-10-CM | POA: Diagnosis not present

## 2017-08-16 DIAGNOSIS — E039 Hypothyroidism, unspecified: Secondary | ICD-10-CM

## 2017-08-16 DIAGNOSIS — D500 Iron deficiency anemia secondary to blood loss (chronic): Secondary | ICD-10-CM

## 2017-08-16 DIAGNOSIS — I129 Hypertensive chronic kidney disease with stage 1 through stage 4 chronic kidney disease, or unspecified chronic kidney disease: Secondary | ICD-10-CM

## 2017-08-16 DIAGNOSIS — Z79899 Other long term (current) drug therapy: Secondary | ICD-10-CM | POA: Diagnosis not present

## 2017-08-16 DIAGNOSIS — N184 Chronic kidney disease, stage 4 (severe): Secondary | ICD-10-CM

## 2017-08-16 DIAGNOSIS — I130 Hypertensive heart and chronic kidney disease with heart failure and stage 1 through stage 4 chronic kidney disease, or unspecified chronic kidney disease: Secondary | ICD-10-CM | POA: Diagnosis not present

## 2017-08-16 DIAGNOSIS — I252 Old myocardial infarction: Secondary | ICD-10-CM

## 2017-08-16 DIAGNOSIS — E1122 Type 2 diabetes mellitus with diabetic chronic kidney disease: Secondary | ICD-10-CM

## 2017-08-16 DIAGNOSIS — Z85820 Personal history of malignant melanoma of skin: Secondary | ICD-10-CM | POA: Diagnosis not present

## 2017-08-16 DIAGNOSIS — Z79890 Hormone replacement therapy (postmenopausal): Secondary | ICD-10-CM

## 2017-08-16 DIAGNOSIS — M109 Gout, unspecified: Secondary | ICD-10-CM | POA: Diagnosis not present

## 2017-08-16 DIAGNOSIS — D649 Anemia, unspecified: Secondary | ICD-10-CM

## 2017-08-16 DIAGNOSIS — I5042 Chronic combined systolic (congestive) and diastolic (congestive) heart failure: Secondary | ICD-10-CM | POA: Diagnosis not present

## 2017-08-16 LAB — CBC WITH DIFFERENTIAL/PLATELET
Basophils Absolute: 0 10*3/uL (ref 0.0–0.1)
Basophils Relative: 1 %
Eosinophils Absolute: 0.4 10*3/uL (ref 0.0–0.7)
Eosinophils Relative: 7 %
HEMATOCRIT: 33.5 % — AB (ref 36.0–46.0)
HEMOGLOBIN: 10.5 g/dL — AB (ref 12.0–15.0)
LYMPHS PCT: 17 %
Lymphs Abs: 0.9 10*3/uL (ref 0.7–4.0)
MCH: 29.4 pg (ref 26.0–34.0)
MCHC: 31.3 g/dL (ref 30.0–36.0)
MCV: 93.8 fL (ref 78.0–100.0)
MONOS PCT: 5 %
Monocytes Absolute: 0.3 10*3/uL (ref 0.1–1.0)
NEUTROS ABS: 4 10*3/uL (ref 1.7–7.7)
NEUTROS PCT: 70 %
Platelets: 305 10*3/uL (ref 150–400)
RBC: 3.57 MIL/uL — ABNORMAL LOW (ref 3.87–5.11)
RDW: 17.2 % — ABNORMAL HIGH (ref 11.5–15.5)
WBC: 5.6 10*3/uL (ref 4.0–10.5)

## 2017-08-16 LAB — SAMPLE TO BLOOD BANK

## 2017-08-16 LAB — COMPREHENSIVE METABOLIC PANEL
ALBUMIN: 3.5 g/dL (ref 3.5–5.0)
ALK PHOS: 56 U/L (ref 38–126)
ALT: 12 U/L — ABNORMAL LOW (ref 14–54)
ANION GAP: 11 (ref 5–15)
AST: 16 U/L (ref 15–41)
BILIRUBIN TOTAL: 0.6 mg/dL (ref 0.3–1.2)
BUN: 50 mg/dL — ABNORMAL HIGH (ref 6–20)
CALCIUM: 9 mg/dL (ref 8.9–10.3)
CO2: 21 mmol/L — ABNORMAL LOW (ref 22–32)
Chloride: 112 mmol/L — ABNORMAL HIGH (ref 101–111)
Creatinine, Ser: 2.25 mg/dL — ABNORMAL HIGH (ref 0.44–1.00)
GFR calc Af Amer: 23 mL/min — ABNORMAL LOW (ref >60)
GFR calc non Af Amer: 20 mL/min — ABNORMAL LOW (ref >60)
GLUCOSE: 97 mg/dL (ref 65–99)
Potassium: 4.8 mmol/L (ref 3.5–5.1)
Sodium: 144 mmol/L (ref 135–145)
TOTAL PROTEIN: 6.7 g/dL (ref 6.5–8.1)

## 2017-08-16 LAB — FERRITIN: Ferritin: 204 ng/mL (ref 11–307)

## 2017-08-16 LAB — LACTATE DEHYDROGENASE: LDH: 134 U/L (ref 98–192)

## 2017-08-16 MED ORDER — DARBEPOETIN ALFA 200 MCG/0.4ML IJ SOSY
PREFILLED_SYRINGE | INTRAMUSCULAR | Status: AC
  Filled 2017-08-16: qty 0.4

## 2017-08-16 MED ORDER — DARBEPOETIN ALFA 200 MCG/0.4ML IJ SOSY
200.0000 ug | PREFILLED_SYRINGE | Freq: Once | INTRAMUSCULAR | Status: AC
  Administered 2017-08-16: 200 ug via SUBCUTANEOUS

## 2017-08-16 NOTE — Progress Notes (Signed)
Diagnosis Symptomatic anemia - Plan: CBC with Differential/Platelet, Comprehensive metabolic panel, Lactate dehydrogenase, Protein electrophoresis, serum, Ferritin  Iron deficiency anemia due to chronic blood loss - Plan: SCHEDULING COMMUNICATION LAB, SCHEDULING COMMUNICATION INJECTION, ARANESP TREATMENT CONDITION, Darbepoetin Alfa (ARANESP) injection 200 mcg  Chronic kidney disease, stage IV (severe) (HCC) - Plan: SCHEDULING COMMUNICATION LAB, SCHEDULING COMMUNICATION INJECTION, ARANESP TREATMENT CONDITION, Darbepoetin Alfa (ARANESP) injection 200 mcg  Staging Cancer Staging No matching staging information was found for the patient.  Assessment and Plan:  1.  [place], [place] Anemia.  Pt was previously followed by Dr. Talbert Cage.  She has CKD and IDA and has been treated in the past with IV iron and Aranesp.  Hb today is 9.3.  She will receive Aranesp today.  She has recently been transfused.  She will continue to have labs monthly for evaluation for possible Aranesp.  She will be seen for follow-up in 4 months.    2.  CKD.  Cr 2.5.  Will check SPEP at next lab visit.    3.  IDA.  She has been treated with IV iron in the past.  She will have repeat ferritin level at next blood draw.    4.  GI bleed.  Continue to follow-up with GI as directed.    Interval History:  77 y.o. female with a medical history significant for chronic renal disease, Stage IV, DM, history of NSTEMI, HTN, hyperlipidemia.  Pt was followed by Dr. Talbert Cage for  normocytic, normochromic anemia with preservation of WBC and platelet count in the setting of chronic renal disease and POSITIVE stool cards.  She was admitted to [facility] on 10/25/2016 discharged on 10/29/2016 when she was found at home unresponsive per patient's husband.  Patient reported that she was feeling fatigued over the last several days and complained of black stools.  The patient does take iron tablets.  In the emergency department, the patient was found to have  severe anemia with a hemoglobin of 3.8 g/dL. she received 4 units of packed red blood cells.  She also has a history of requiring 4 units of packed red blood cells on 10/04/2016 as well.  During her hospitalization, she was found to have fecal occult positive stool cards on 10/29/2016 2.  She is S/P colonoscopy by Dr. Ronnette Juniper on 10/08/2016 which revealed 1 5 mm polyp in the transverse colon and resection was not attempted due to anticoagulation.  Diverticulosis in sigmoid colon and nonbleeding internal hemorrhoids were noted as well.  Examination was otherwise negative.  She also went a camera capsule study on 10/08/2016 by the same position showing no mucosal abnormality within the small bowel.  She reports a history of melanoma.  She notes that this has improved and resolved.  She admits to a good energy level and denies any weakness.  She notes her stamina is good as well.  She walks at [facility] frequently for exercise.  She interestingly admits to pica having consumed a crushed ice for years.  She reports [name] as the best ice.  Her anemia workup demonstrated a mild iron deficiency with a ferritin of 39. The rest of her anemia workup including a vitamin B12 level, folate level, SPEP/IFE, LDH were within normal limits. Patient received 2 doses of Injectafer on 11/15/16 and 12/03/16.  Current Status:   Pt is seen today for follow-up to go over labs.  She is here for possible Aranesp injection.    Problem List Patient Active Problem List   Diagnosis  Date Noted  . Anemia [D64.9] 08/07/2017  . GI bleed [K92.2] 07/30/2017  . Hyperkalemia [E87.5] 07/30/2017  . Essential hypertension, benign [I10] 12/13/2016  . Hypothyroidism [E03.9] 12/13/2016  . Iron deficiency anemia [D50.9] 11/08/2016  . Aortic atherosclerosis ([place]) [I70.0] 10/25/2016  . Symptomatic anemia [D64.9] 10/04/2016  . History of non-ST elevation myocardial infarction (NSTEMI) [I25.2] 10/04/2016  . Neuropathy [G62.9] 09/20/2016   . Chronic kidney disease, stage IV (severe) ([place]) [N18.4] 08/03/2015  . Hip fracture ([place]) [S72.009A] 02/05/2015  . Diabetes mellitus with stage 4 chronic kidney disease ([place]) [Z32.99, N18.4] 02/05/2015  . HTN (hypertension) [I10] 02/05/2015  . Gout [M10.9] 02/05/2015  . Left humeral fracture [S42.302A] 02/05/2015  . Hyperlipidemia associated with type 2 diabetes mellitus ([place]) [E11.69, E78.5] 05/01/2013    Past Medical History Past Medical History:  Diagnosis Date  . Anemia   . Arthritis   . Blood transfusion without reported diagnosis   . Cataract   . Chronic anemia   . Chronic combined systolic and diastolic CHF (congestive heart failure) ([place])    a. 2D echo 08/2016 at [facility]: EF 50-55% with inferior wall HK, impaired LV filling, fair study.  . CKD (chronic kidney disease), stage III ([place])   . Diabetes ([place])   . Gastritis   . GERD (gastroesophageal reflux disease)   . Gout   . HTN (hypertension)   . Hyperlipidemia   . Hypothyroidism   . Iron deficiency anemia 11/08/2016  . Normocytic anemia 10/26/2016  . NSTEMI (non-ST elevated myocardial infarction) ([place])    a. Complex admission 08/2016 - with severe hyperglycemia, AKI on CKD, severe anemia down to Hgb 6.8, acute combined CHF, troponin of 8.5, cath deferred due to renal dysfunction.  . Thyroid disease     Past Surgical History Past Surgical History:  Procedure Laterality Date  . COLONOSCOPY WITH PROPOFOL N/A 10/08/2016   Procedure: COLONOSCOPY WITH PROPOFOL;  Surgeon: Ronnette Juniper, MD;  Location: [place];  Service: Gastroenterology;  Laterality: N/A;  . ENTEROSCOPY N/A 08/08/2017   Procedure: ENTEROSCOPY;  Surgeon: Daneil Dolin, MD;  [name];  Service: Endoscopy;  Laterality: N/A;  . ESOPHAGOGASTRODUODENOSCOPY N/A 10/06/2016   Procedure: ESOPHAGOGASTRODUODENOSCOPY (EGD);  Surgeon: Otis Brace, MD;  Location[facility] END[facility]COPY;  Service: Gastroenterology;  Laterality: N/A;  . ESOPHAGOGASTRODUODENOSCOPY  (EGD) WITH PROPOFOL N/A 03/12/2017   Procedure: ESOPHAGOGASTRODUODENOSCOPY (EGD) WITH PROPOFOL;  Surgeon: Danie Binder, MD;  [name];  Service: Endoscopy;  Laterality: N/A;  . ESOPHAGOGASTRODUODENOSCOPY (EGD) WITH PROPOFOL N/A 08/08/2017   Procedure: ESOPHAGOGASTRODUODENOSCOPY (EGD) WITH PROPOFOL;  Surgeon: Daneil Dolin, MD;  [name];  Service: Endoscopy;  Laterality: N/A;  use pediatric colonoscope per Dr. Gala Romney  . GIVENS CAPSULE STUDY  10/08/2016   Procedure: GIVENS CAPSULE STUDY;  Surgeon: Ronnette Juniper, MD;  Location: [place];  Service: Gastroenterology;;  . Freda Munro CAPSULE STUDY N/A 10/29/2016   Procedure: GIVENS CAPSULE STUDY;  Surgeon: Danie Binder, MD;  [name];  Service: Endoscopy;  Laterality: N/A;  . ORIF HUMERUS FRACTURE Left 02/07/2015   Procedure: OPEN REDUCTION INTERNAL FIXATION (ORIF) PROXIMAL HUMERUS FRACTURE;  Surgeon: Marybelle Killings, MD;  Location: [place];  Service: Orthopedics;  Laterality: Left;  . THYROID SURGERY    . TOTAL HIP ARTHROPLASTY Left 02/07/2015   Procedure: TOTAL HIP ARTHROPLASTY ANTERIOR APPROACH ;  Surgeon: Marybelle Killings, MD;  Location: [place];  Service: Orthopedics;  Laterality: Left;  . WRIST SURGERY Left     Family History  Family History  Problem Relation Age of Onset  . Diabetes Mother   . Asthma Mother   . Early death Mother 43       Pneumonia  . Early death Father        Killed at rodeo  . CAD Neg Hx   . GI Bleed Neg Hx      Social History  reports that she has never smoked. She has never used smokeless tobacco. She reports that she does not drink alcohol or use drugs.  Medications  Current Outpatient Medications:  .  acetaminophen (TYLENOL) 325 MG tablet, Take 2 tablets (650 mg total) by mouth every 6 (six) hours as needed for mild pain or headache (or Fever >/= 101)., Disp: 40 tablet, Rfl: 0 .  amLODipine (NORVASC) 10 MG tablet, Take 1 tablet (10 mg total) by mouth daily., Disp: 90 tablet,  Rfl: 3 .  docusate sodium (COLACE) 100 MG capsule, Take 1 capsule (100 mg total) by mouth every 12 (twelve) hours. (Patient taking differently: Take 100 mg by mouth daily. ), Disp: 60 capsule, Rfl: 0 .  furosemide (LASIX) 20 MG tablet, Take 20 mg by mouth as needed. Only has to take it when she gains 2 lbs, Disp: , Rfl:  .  gabapentin (NEURONTIN) 300 MG capsule, Take 2 capsules (600 mg total) by mouth at bedtime., Disp: 180 capsule, Rfl: 1 .  hydrALAZINE (APRESOLINE) 25 MG tablet, TAKE 1 TABLET BY MOUTH EVERY 8 HOURS AS NEEDED, Disp: 30 tablet, Rfl: 3 .  Insulin Glargine (TOUJEO MAX SOLOSTAR) 300 UNIT/ML SOPN, Inject 20 Units at bedtime into the skin. (Patient taking differently: Inject 24 Units into the skin at bedtime. ), Disp: 3 pen, Rfl: 2 .  levothyroxine (SYNTHROID, LEVOTHROID) 50 MCG tablet, TAKE 1 TABLET BY MOUTH ONCE DAILY BEFORE  BREAKFAST, Disp: 30 tablet, Rfl: 3 .  metoprolol tartrate (LOPRESSOR) 25 MG tablet, Take 12.5 mg by mouth 2 (two) times daily., Disp: , Rfl:  .  Multiple Vitamin (MULTIVITAMIN WITH MINERALS) TABS tablet, Take 1 tablet by mouth daily., Disp: , Rfl:  .  ondansetron (ZOFRAN) 4 MG tablet, Take 1 tablet (4 mg total) by mouth every 8 (eight) hours as needed for nausea or vomiting., Disp: 20 tablet, Rfl: 1 .  pantoprazole (PROTONIX) 40 MG tablet, Take 1 tablet (40 mg total) by mouth 2 (two) times daily. 30 minutes before breakfast, Disp: 60 tablet, Rfl: 1  Allergies Insulin glargine and Statins  Review of Systems Review of Systems - Oncology ROS as per HPI otherwise 12 point ROS is negative other than fatigue.     Physical Exam  Vitals Wt Readings from Last 3 Encounters:  08/08/17 173 lb (78.5 kg)  08/06/17 173 lb (78.5 kg)  08/02/17 174 lb 4.8 oz (79.1 kg)   Temp Readings from Last 3 Encounters:  08/09/17 98.4 F (36.9 C) (Oral)  08/06/17 98.2 F (36.8 C) (Temporal)  08/02/17 97.7 F (36.5 C) (Oral)   BP Readings from Last 3 Encounters:  08/09/17 (!)  126/52  08/06/17 132/60  08/02/17 (!) 155/78   Pulse Readings from Last 3 Encounters:  08/09/17 67  08/06/17 76  08/02/17 74    Constitutional: Well-developed, well-nourished, and in no distress.   HENT: Head: Normocephalic and atraumatic.  Mouth/Throat: No oropharyngeal exudate. Mucosa moist. Eyes: Pupils are equal, round, and reactive to light. Conjunctivae are normal. No scleral icterus.  Neck: Normal range of motion. Neck supple. No JVD present.  Cardiovascular: Normal rate, regular rhythm  and normal heart sounds.  Exam reveals no gallop and no friction rub.   No murmur heard. Pulmonary/Chest: Effort normal and breath sounds normal. No respiratory distress. No wheezes.No rales.  Abdominal: Soft. Bowel sounds are normal. No distension. There is no tenderness. There is no guarding.  Musculoskeletal: No edema or tenderness.  Lymphadenopathy: No cervical, axillary or supraclavicular adenopathy.  Neurological: Alert and oriented to person, place, and time. No cranial nerve deficit.  Skin: Skin is warm and dry. No rash noted. No erythema. No pallor.  Psychiatric: Affect and judgment normal.   Labs Appointment on 08/02/2017  Component Date Value Ref Range Status  . WBC 08/02/2017 5.5  4.0 - 10.5 K/uL Final  . RBC 08/02/2017 3.11* 3.87 - 5.11 MIL/uL Final  . Hemoglobin 08/02/2017 9.3* 12.0 - 15.0 g/dL Final  . HCT 08/02/2017 28.6* 36.0 - 46.0 % Final  . MCV 08/02/2017 92.0  78.0 - 100.0 fL Final  . MCH 08/02/2017 29.9  26.0 - 34.0 pg Final  . MCHC 08/02/2017 32.5  30.0 - 36.0 g/dL Final  . RDW 08/02/2017 15.6* 11.5 - 15.5 % Final  . Platelets 08/02/2017 293  150 - 400 K/uL Final  . Neutrophils Relative % 08/02/2017 68  % Final  . Neutro Abs 08/02/2017 3.8  1.7 - 7.7 K/uL Final  . Lymphocytes Relative 08/02/2017 18  % Final  . Lymphs Abs 08/02/2017 1.0  0.7 - 4.0 K/uL Final  . Monocytes Relative 08/02/2017 7  % Final  . Monocytes Absolute 08/02/2017 0.4  0.1 - 1.0 K/uL Final  .  Eosinophils Relative 08/02/2017 6  % Final  . Eosinophils Absolute 08/02/2017 0.3  0.0 - 0.7 K/uL Final  . Basophils Relative 08/02/2017 1  % Final  . Basophils Absolute 08/02/2017 0.0  0.0 - 0.1 K/uL Final   Performed at [facility], [address] [place], [place] [address]  . Sodium 08/02/2017 140  135 - 145 mmol/L Final  . Potassium 08/02/2017 5.5* 3.5 - 5.1 mmol/L Final  . Chloride 08/02/2017 107  101 - 111 mmol/L Final  . CO2 08/02/2017 24  22 - 32 mmol/L Final  . Glucose, Bld 08/02/2017 176* 65 - 99 mg/dL Final  . BUN 08/02/2017 70* 6 - 20 mg/dL Final  . Creatinine, Ser 08/02/2017 2.50* 0.44 - 1.00 mg/dL Final  . Calcium 08/02/2017 9.2  8.9 - 10.3 mg/dL Final  . Total Protein 08/02/2017 6.4* 6.5 - 8.1 g/dL Final  . Albumin 08/02/2017 3.5  3.5 - 5.0 g/dL Final  . AST 08/02/2017 17  15 - 41 U/L Final  . ALT 08/02/2017 14  14 - 54 U/L Final  . Alkaline Phosphatase 08/02/2017 54  38 - 126 U/L Final  . Total Bilirubin 08/02/2017 0.5  0.3 - 1.2 mg/dL Final  . GFR calc non Af Amer 08/02/2017 17* >60 mL/min Final  . GFR calc Af Amer 08/02/2017 20* >60 mL/min Final   Comment: (NOTE) The eGFR has been calculated using the CKD EPI equation. This calculation has not been validated in all clinical situations. eGFR's persistently <60 mL/min signify possible Chronic Kidney Disease.   Georgiann Hahn gap 08/02/2017 9  5 - 15 Final   Performed at [facility], [address], [place], [place] [address]  Admission on 07/30/2017, Discharged on 07/31/2017  Component Date Value Ref Range Status  . WBC 07/30/2017 6.5  4.0 - 10.5 K/uL Final  . RBC 07/30/2017 2.75* 3.87 - 5.11 MIL/uL Final  . Hemoglobin 07/30/2017 8.4* 12.0 -  15.0 g/dL Final  . HCT 07/30/2017 25.6* 36.0 - 46.0 % Final  . MCV 07/30/2017 93.1  78.0 - 100.0 fL Final  . MCH 07/30/2017 30.5  26.0 - 34.0 pg Final  . MCHC 07/30/2017 32.8  30.0 - 36.0 g/dL Final  . RDW 07/30/2017 15.0  11.5 - 15.5 % Final  . Platelets 07/30/2017 347  150 - 400  K/uL Final   Performed at [facility], [address], [place], [place] [address]  . Sodium 07/30/2017 141  135 - 145 mmol/L Final  . Potassium 07/30/2017 5.5* 3.5 - 5.1 mmol/L Final  . Chloride 07/30/2017 109  101 - 111 mmol/L Final  . CO2 07/30/2017 21* 22 - 32 mmol/L Final  . Glucose, Bld 07/30/2017 220* 65 - 99 mg/dL Final  . BUN 07/30/2017 72* 6 - 20 mg/dL Final  . Creatinine, Ser 07/30/2017 2.65* 0.44 - 1.00 mg/dL Final  . Calcium 07/30/2017 9.5  8.9 - 10.3 mg/dL Final  . GFR calc non Af Amer 07/30/2017 16* >60 mL/min Final  . GFR calc Af Amer 07/30/2017 19* >60 mL/min Final   Comment: (NOTE) The eGFR has been calculated using the CKD EPI equation. This calculation has not been validated in all clinical situations. eGFR's persistently <60 mL/min signify possible Chronic Kidney Disease.   Georgiann Hahn gap 07/30/2017 11  5 - 15 Final   Performed at [facility], [address], [place], [place] [address]  . ABO/RH(D) 07/30/2017 B POS   Final  . Antibody Screen 07/30/2017 NEG   Final  . Sample Expiration 07/30/2017 08/02/2017   Final  . Unit Number 07/30/2017 [id number]   Final  . Blood Component Type 07/30/2017 RED CELLS,LR   Final  . Unit division 07/30/2017 00   Final  . Status of Unit 07/30/2017 ISSUED,FINAL   Final  . Transfusion Status 07/30/2017 OK TO TRANSFUSE   Final  . Crossmatch Result 07/30/2017 Compatible   Final  . Unit Number 07/30/2017 [id number]   Final  . Blood Component Type 07/30/2017 RED CELLS,LR   Final  . Unit division 07/30/2017 00   Final  . Status of Unit 07/30/2017 ISSUED,FINAL   Final  . Transfusion Status 07/30/2017 OK TO TRANSFUSE   Final  . Crossmatch Result 07/30/2017    Final                   Value:Compatible Performed at [facility], [address] [place], [place] [address]   . Fecal Occult Bld 07/30/2017 POSITIVE* NEGATIVE Final  . Order Confirmation 07/30/2017    Final                   Value:ORDER PROCESSED BY BLOOD BANK Performed at  [facility], [address], [place],  [address]   . Sodium 07/31/2017 143  135 - 145 mmol/L Final  . Potassium 07/31/2017 4.9  3.5 - 5.1 mmol/L Final  . Chloride 07/31/2017 111  101 - 111 mmol/L Final  . CO2 07/31/2017 23  22 - 32 mmol/L Final  . Glucose, Bld 07/31/2017 86  65 - 99 mg/dL Final  . BUN 07/31/2017 69* 6 - 20 mg/dL Final  . Creatinine, Ser 07/31/2017 2.51* 0.44 - 1.00 mg/dL Final  . Calcium 07/31/2017 8.9  8.9 - 10.3 mg/dL Final  . GFR calc non Af Amer 07/31/2017 17* >60 mL/min Final  . GFR calc Af Amer 07/31/2017 20* >60 mL/min Final   Comment: (NOTE) The eGFR has been calculated using the  CKD EPI equation. This calculation has not been validated in all clinical situations. eGFR's persistently <60 mL/min signify possible Chronic Kidney Disease.   Georgiann Hahn gap 07/31/2017 9  5 - 15 Final   Performed at [facility], [address] [place], [place] [address]  . Potassium 07/31/2017 5.0  3.5 - 5.1 mmol/L Final   Performed at [facility], [address] [place], [place] [address]  . ISSUE DATE / TIME 07/30/2017 [id number]   Final  . Blood Product Unit Number 07/30/2017 [id number]   Final  . PRODUCT CODE 07/30/2017 [id number]   Final  . Unit Type and Rh 07/30/2017 1700   Final  . Blood Product Expiration Date 07/30/2017 [id number]   Final  . ISSUE DATE / TIME 07/30/2017 [id number]   Final  . Blood Product Unit Number 07/30/2017 [id number]   Final  . PRODUCT CODE 07/30/2017 [id number]   Final  . Unit Type and Rh 07/30/2017 1700   Final  . Blood Product Expiration Date 07/30/2017 [id number]   Final  . Glucose-Capillary 07/30/2017 128* 65 - 99 mg/dL Final  . WBC 07/31/2017 5.9  4.0 - 10.5 K/uL Final  . RBC 07/31/2017 3.31* 3.87 - 5.11 MIL/uL Final  . Hemoglobin 07/31/2017 9.6* 12.0 - 15.0 g/dL Final  . HCT 07/31/2017 29.6* 36.0 - 46.0 % Final  . MCV 07/31/2017 89.4  78.0 - 100.0 fL Final  . MCH 07/31/2017 29.0  26.0 - 34.0 pg Final  . MCHC 07/31/2017 32.4   30.0 - 36.0 g/dL Final  . RDW 07/31/2017 15.6* 11.5 - 15.5 % Final  . Platelets 07/31/2017 270  150 - 400 K/uL Final   Performed at [facility], [address] [place], [place] [address]  . Glucose-Capillary 07/31/2017 116* 65 - 99 mg/dL Final  . Comment 1 07/31/2017 Notify RN   Final  . Comment 2 07/31/2017 Document in Chart   Final  . Glucose-Capillary 07/31/2017 78  65 - 99 mg/dL Final  . Comment 1 07/31/2017 Notify RN   Final  . Comment 2 07/31/2017 Document in Chart   Final  . Glucose-Capillary 07/31/2017 74  65 - 99 mg/dL Final  . Comment 1 07/31/2017 Notify RN   Final  . Comment 2 07/31/2017 Document in Chart   Final     Pathology Orders Placed This Encounter  Procedures  . CBC with Differential/Platelet    Standing Status:   Future    Standing Expiration Date:   08/03/2018  . Comprehensive metabolic panel    Standing Status:   Future    Standing Expiration Date:   08/03/2018  . Lactate dehydrogenase    Standing Status:   Future    Standing Expiration Date:   08/03/2018  . Protein electrophoresis, serum    Standing Status:   Future    Standing Expiration Date:   08/03/2018  . Ferritin    Standing Status:   Future    Standing Expiration Date:   08/03/2018  . [facility] COMMUNICATION LAB    Lab appointment 10 min.  Marland Kitchen SCHEDULING COMMUNICATION INJECTION    Schedule injection appointment 15 min  . ARANESP TREATMENT CONDITION    Hold Aranesp: Chemotherapy Induced Anemia hold for Hemoglobin greater than 10 / Renal hold for Hemoglobin greater than 11       Mathis Dad Ladislav Caselli MD

## 2017-08-16 NOTE — Patient Instructions (Addendum)
[place] at [facility] Discharge Instructions  You were seen today by Dr. Walden Field. She went over your recent lab results and your numbers are looking a little better. We will refer you to a dermatologist to do a skin evaluation. We will give you your Aranesp injection today if it's due. We will see you back in 2 weeks for labs and injection. We will see you back in 3 months for labs, injection and follow up.   Thank you for choosing [place] at [facility] to provide your oncology and hematology care.  To afford each patient quality time with our provider, please arrive at least 15 minutes before your scheduled appointment time.   If you have a lab appointment with the [facility] [place] please come in thru the  Main Entrance and check in at the main information desk  You need to re-schedule your appointment should you arrive 10 or more minutes late.  We strive to give you quality time with our providers, and arriving late affects you and other patients whose appointments are after yours.  Also, if you no show three or more times for appointments you may be dismissed from the clinic at the providers discretion.     Again, thank you for choosing [facility].  Our hope is that these requests will decrease the amount of time that you wait before being seen by our physicians.       _____________________________________________________________  Should you have questions after your visit to [facility], please contact our office at [phone] between the hours of 8:30 a.m. and 4:30 p.m.  Voicemails left after 4:30 p.m. will not be returned until the following business day.  For prescription refill requests, have your pharmacy contact our office.       Resources For Cancer Patients and their Caregivers ? American Cancer Society: Can assist with transportation, wigs, general needs, runs Look Good Feel Better.         806-031-9319 ? Cancer Care: Provides financial assistance, online support groups, medication/co-pay assistance.  1-800-813-HOPE (213)790-6793) ? [facility] Assists [place] cancer patients and their families through emotional , educational and financial support.  (952)066-8229 ? [place] DSS Where to apply for food stamps, Medicaid and utility assistance. (580) 043-0303 ? [facility]: Transportation to medical appointments. 585-761-7460 ? Social Security Administration: May apply for disability if have a Stage IV cancer. ([phone] 105-3046 2052244486 ? [employer], Disability and Transit Services: Assists with nutrition, care and transit needs. [place] Support Programs:   > Cancer Support Group  2nd Tuesday of the month 1pm-2pm, Journey Room   > Creative Journey  3rd Tuesday of the month 1130am-1pm, Journey Room

## 2017-08-16 NOTE — Progress Notes (Signed)
Pt given Aranesp injection in Left arm. Pt tolerated well. Pt stable and discharged home ambulatory. Pt to return in 2 weeks for labs and injection.

## 2017-08-19 LAB — PROTEIN ELECTROPHORESIS, SERUM
A/G Ratio: 1.2 (ref 0.7–1.7)
Albumin ELP: 3.4 g/dL (ref 2.9–4.4)
Alpha-1-Globulin: 0.2 g/dL (ref 0.0–0.4)
Alpha-2-Globulin: 0.8 g/dL (ref 0.4–1.0)
BETA GLOBULIN: 1.1 g/dL (ref 0.7–1.3)
GAMMA GLOBULIN: 0.7 g/dL (ref 0.4–1.8)
Globulin, Total: 2.8 g/dL (ref 2.2–3.9)
Total Protein ELP: 6.2 g/dL (ref 6.0–8.5)

## 2017-08-20 ENCOUNTER — Ambulatory Visit (HOSPITAL_COMMUNITY): Payer: Medicare HMO | Admitting: Internal Medicine

## 2017-08-20 ENCOUNTER — Other Ambulatory Visit (HOSPITAL_COMMUNITY): Payer: Medicare HMO

## 2017-08-26 ENCOUNTER — Encounter: Payer: Self-pay | Admitting: Family Medicine

## 2017-08-28 ENCOUNTER — Other Ambulatory Visit: Payer: Self-pay | Admitting: Family Medicine

## 2017-08-30 ENCOUNTER — Inpatient Hospital Stay (HOSPITAL_COMMUNITY): Payer: Medicare HMO

## 2017-08-30 ENCOUNTER — Other Ambulatory Visit (HOSPITAL_COMMUNITY): Payer: Medicare HMO

## 2017-08-30 ENCOUNTER — Inpatient Hospital Stay (HOSPITAL_COMMUNITY): Payer: Medicare HMO | Attending: Internal Medicine

## 2017-08-30 ENCOUNTER — Ambulatory Visit (HOSPITAL_COMMUNITY): Payer: Medicare HMO | Admitting: Internal Medicine

## 2017-08-30 ENCOUNTER — Ambulatory Visit: Payer: Medicare HMO | Admitting: Family Medicine

## 2017-08-30 ENCOUNTER — Ambulatory Visit (HOSPITAL_COMMUNITY): Payer: Medicare HMO

## 2017-08-30 DIAGNOSIS — D649 Anemia, unspecified: Secondary | ICD-10-CM

## 2017-08-30 DIAGNOSIS — E039 Hypothyroidism, unspecified: Secondary | ICD-10-CM | POA: Insufficient documentation

## 2017-08-30 DIAGNOSIS — D500 Iron deficiency anemia secondary to blood loss (chronic): Secondary | ICD-10-CM | POA: Insufficient documentation

## 2017-08-30 DIAGNOSIS — M109 Gout, unspecified: Secondary | ICD-10-CM | POA: Diagnosis not present

## 2017-08-30 DIAGNOSIS — N184 Chronic kidney disease, stage 4 (severe): Secondary | ICD-10-CM | POA: Insufficient documentation

## 2017-08-30 DIAGNOSIS — Z79899 Other long term (current) drug therapy: Secondary | ICD-10-CM | POA: Diagnosis not present

## 2017-08-30 DIAGNOSIS — I5042 Chronic combined systolic (congestive) and diastolic (congestive) heart failure: Secondary | ICD-10-CM | POA: Insufficient documentation

## 2017-08-30 DIAGNOSIS — I130 Hypertensive heart and chronic kidney disease with heart failure and stage 1 through stage 4 chronic kidney disease, or unspecified chronic kidney disease: Secondary | ICD-10-CM | POA: Diagnosis not present

## 2017-08-30 DIAGNOSIS — K219 Gastro-esophageal reflux disease without esophagitis: Secondary | ICD-10-CM | POA: Insufficient documentation

## 2017-08-30 DIAGNOSIS — K922 Gastrointestinal hemorrhage, unspecified: Secondary | ICD-10-CM | POA: Diagnosis not present

## 2017-08-30 DIAGNOSIS — Z794 Long term (current) use of insulin: Secondary | ICD-10-CM | POA: Insufficient documentation

## 2017-08-30 DIAGNOSIS — E875 Hyperkalemia: Secondary | ICD-10-CM | POA: Diagnosis not present

## 2017-08-30 DIAGNOSIS — Z85820 Personal history of malignant melanoma of skin: Secondary | ICD-10-CM | POA: Insufficient documentation

## 2017-08-30 DIAGNOSIS — E785 Hyperlipidemia, unspecified: Secondary | ICD-10-CM | POA: Diagnosis not present

## 2017-08-30 DIAGNOSIS — E1122 Type 2 diabetes mellitus with diabetic chronic kidney disease: Secondary | ICD-10-CM | POA: Diagnosis not present

## 2017-08-30 DIAGNOSIS — I252 Old myocardial infarction: Secondary | ICD-10-CM | POA: Insufficient documentation

## 2017-08-30 LAB — LACTATE DEHYDROGENASE: LDH: 145 U/L (ref 98–192)

## 2017-08-30 LAB — CBC WITH DIFFERENTIAL/PLATELET
BASOS ABS: 0.1 10*3/uL (ref 0.0–0.1)
BASOS PCT: 2 %
EOS ABS: 0.4 10*3/uL (ref 0.0–0.7)
EOS PCT: 7 %
HCT: 36.7 % (ref 36.0–46.0)
Hemoglobin: 11.6 g/dL — ABNORMAL LOW (ref 12.0–15.0)
Lymphocytes Relative: 16 %
Lymphs Abs: 0.9 10*3/uL (ref 0.7–4.0)
MCH: 29.1 pg (ref 26.0–34.0)
MCHC: 31.6 g/dL (ref 30.0–36.0)
MCV: 92.2 fL (ref 78.0–100.0)
MONO ABS: 0.4 10*3/uL (ref 0.1–1.0)
Monocytes Relative: 7 %
Neutro Abs: 4.1 10*3/uL (ref 1.7–7.7)
Neutrophils Relative %: 68 %
PLATELETS: 288 10*3/uL (ref 150–400)
RBC: 3.98 MIL/uL (ref 3.87–5.11)
RDW: 16 % — AB (ref 11.5–15.5)
WBC: 6 10*3/uL (ref 4.0–10.5)

## 2017-08-30 LAB — COMPREHENSIVE METABOLIC PANEL
ALBUMIN: 3.9 g/dL (ref 3.5–5.0)
ALT: 13 U/L — ABNORMAL LOW (ref 14–54)
AST: 21 U/L (ref 15–41)
Alkaline Phosphatase: 66 U/L (ref 38–126)
Anion gap: 12 (ref 5–15)
BUN: 36 mg/dL — AB (ref 6–20)
CHLORIDE: 111 mmol/L (ref 101–111)
CO2: 22 mmol/L (ref 22–32)
Calcium: 9.4 mg/dL (ref 8.9–10.3)
Creatinine, Ser: 2.1 mg/dL — ABNORMAL HIGH (ref 0.44–1.00)
GFR calc Af Amer: 25 mL/min — ABNORMAL LOW (ref >60)
GFR calc non Af Amer: 22 mL/min — ABNORMAL LOW (ref >60)
Glucose, Bld: 114 mg/dL — ABNORMAL HIGH (ref 65–99)
POTASSIUM: 4.1 mmol/L (ref 3.5–5.1)
SODIUM: 145 mmol/L (ref 135–145)
Total Bilirubin: 0.7 mg/dL (ref 0.3–1.2)
Total Protein: 7.3 g/dL (ref 6.5–8.1)

## 2017-08-30 LAB — FERRITIN: FERRITIN: 159 ng/mL (ref 11–307)

## 2017-08-30 NOTE — Progress Notes (Signed)
Hemoglobin 11.6. No need for aranesp today per protocol. Follow up as scheduled.

## 2017-09-04 LAB — PROTEIN ELECTROPHORESIS, SERUM
A/G Ratio: 1 (ref 0.7–1.7)
ALPHA-1-GLOBULIN: 0.2 g/dL (ref 0.0–0.4)
Albumin ELP: 3.2 g/dL (ref 2.9–4.4)
Alpha-2-Globulin: 1 g/dL (ref 0.4–1.0)
BETA GLOBULIN: 1.1 g/dL (ref 0.7–1.3)
GAMMA GLOBULIN: 0.7 g/dL (ref 0.4–1.8)
Globulin, Total: 3.1 g/dL (ref 2.2–3.9)
TOTAL PROTEIN ELP: 6.3 g/dL (ref 6.0–8.5)

## 2017-09-16 ENCOUNTER — Inpatient Hospital Stay (HOSPITAL_COMMUNITY): Payer: Medicare HMO

## 2017-09-16 DIAGNOSIS — D500 Iron deficiency anemia secondary to blood loss (chronic): Secondary | ICD-10-CM | POA: Diagnosis not present

## 2017-09-16 DIAGNOSIS — E1122 Type 2 diabetes mellitus with diabetic chronic kidney disease: Secondary | ICD-10-CM | POA: Diagnosis not present

## 2017-09-16 DIAGNOSIS — E785 Hyperlipidemia, unspecified: Secondary | ICD-10-CM | POA: Diagnosis not present

## 2017-09-16 DIAGNOSIS — I5042 Chronic combined systolic (congestive) and diastolic (congestive) heart failure: Secondary | ICD-10-CM | POA: Diagnosis not present

## 2017-09-16 DIAGNOSIS — N184 Chronic kidney disease, stage 4 (severe): Secondary | ICD-10-CM | POA: Diagnosis not present

## 2017-09-16 DIAGNOSIS — D649 Anemia, unspecified: Secondary | ICD-10-CM

## 2017-09-16 DIAGNOSIS — K922 Gastrointestinal hemorrhage, unspecified: Secondary | ICD-10-CM | POA: Diagnosis not present

## 2017-09-16 DIAGNOSIS — Z79899 Other long term (current) drug therapy: Secondary | ICD-10-CM | POA: Diagnosis not present

## 2017-09-16 DIAGNOSIS — I130 Hypertensive heart and chronic kidney disease with heart failure and stage 1 through stage 4 chronic kidney disease, or unspecified chronic kidney disease: Secondary | ICD-10-CM | POA: Diagnosis not present

## 2017-09-16 DIAGNOSIS — E039 Hypothyroidism, unspecified: Secondary | ICD-10-CM | POA: Diagnosis not present

## 2017-09-16 LAB — CBC WITH DIFFERENTIAL/PLATELET
Basophils Absolute: 0 10*3/uL (ref 0.0–0.1)
Basophils Relative: 1 %
EOS ABS: 0.4 10*3/uL (ref 0.0–0.7)
EOS PCT: 7 %
HCT: 36.8 % (ref 36.0–46.0)
Hemoglobin: 12 g/dL (ref 12.0–15.0)
Lymphocytes Relative: 22 %
Lymphs Abs: 1.3 10*3/uL (ref 0.7–4.0)
MCH: 29.8 pg (ref 26.0–34.0)
MCHC: 32.6 g/dL (ref 30.0–36.0)
MCV: 91.3 fL (ref 78.0–100.0)
MONO ABS: 0.4 10*3/uL (ref 0.1–1.0)
MONOS PCT: 6 %
Neutro Abs: 3.9 10*3/uL (ref 1.7–7.7)
Neutrophils Relative %: 64 %
PLATELETS: 295 10*3/uL (ref 150–400)
RBC: 4.03 MIL/uL (ref 3.87–5.11)
RDW: 14.8 % (ref 11.5–15.5)
WBC: 6 10*3/uL (ref 4.0–10.5)

## 2017-09-16 NOTE — Progress Notes (Signed)
Hgb 12 today so Aranesp injection held per parameters given

## 2017-09-26 ENCOUNTER — Other Ambulatory Visit (HOSPITAL_COMMUNITY)
Admission: RE | Admit: 2017-09-26 | Discharge: 2017-09-26 | Disposition: A | Discharge status: Home / Self Care | Payer: Medicare HMO | Source: Ambulatory Visit | Attending: Nephrology | Admitting: Nephrology

## 2017-09-26 DIAGNOSIS — E559 Vitamin D deficiency, unspecified: Secondary | ICD-10-CM | POA: Insufficient documentation

## 2017-09-26 DIAGNOSIS — D509 Iron deficiency anemia, unspecified: Secondary | ICD-10-CM | POA: Insufficient documentation

## 2017-09-26 DIAGNOSIS — I10 Essential (primary) hypertension: Secondary | ICD-10-CM | POA: Insufficient documentation

## 2017-09-26 DIAGNOSIS — R809 Proteinuria, unspecified: Secondary | ICD-10-CM | POA: Insufficient documentation

## 2017-09-26 DIAGNOSIS — N183 Chronic kidney disease, stage 3 (moderate): Secondary | ICD-10-CM | POA: Insufficient documentation

## 2017-09-26 DIAGNOSIS — Z79899 Other long term (current) drug therapy: Secondary | ICD-10-CM | POA: Insufficient documentation

## 2017-09-26 LAB — RENAL FUNCTION PANEL
ALBUMIN: 3.6 g/dL (ref 3.5–5.0)
Anion gap: 13 (ref 5–15)
BUN: 30 mg/dL — AB (ref 6–20)
CHLORIDE: 105 mmol/L (ref 101–111)
CO2: 23 mmol/L (ref 22–32)
CREATININE: 2.55 mg/dL — AB (ref 0.44–1.00)
Calcium: 9.3 mg/dL (ref 8.9–10.3)
GFR calc non Af Amer: 17 mL/min — ABNORMAL LOW (ref >60)
GFR, EST AFRICAN AMERICAN: 20 mL/min — AB (ref >60)
Glucose, Bld: 128 mg/dL — ABNORMAL HIGH (ref 65–99)
PHOSPHORUS: 4.5 mg/dL (ref 2.5–4.6)
POTASSIUM: 4.3 mmol/L (ref 3.5–5.1)
Sodium: 141 mmol/L (ref 135–145)

## 2017-09-26 LAB — HEMOGLOBIN AND HEMATOCRIT, BLOOD
HEMATOCRIT: 36.5 % (ref 36.0–46.0)
HEMOGLOBIN: 11.9 g/dL — AB (ref 12.0–15.0)

## 2017-09-26 LAB — PROTEIN / CREATININE RATIO, URINE
Creatinine, Urine: 133.44 mg/dL
Protein Creatinine Ratio: 2.43 mg/mg{Cre} — ABNORMAL HIGH (ref 0.00–0.15)
Total Protein, Urine: 324 mg/dL

## 2017-09-27 ENCOUNTER — Ambulatory Visit (HOSPITAL_COMMUNITY): Payer: Medicare HMO

## 2017-09-27 ENCOUNTER — Other Ambulatory Visit (HOSPITAL_COMMUNITY): Payer: Self-pay

## 2017-09-27 ENCOUNTER — Other Ambulatory Visit (HOSPITAL_COMMUNITY): Payer: Medicare HMO

## 2017-09-27 DIAGNOSIS — D649 Anemia, unspecified: Secondary | ICD-10-CM

## 2017-09-27 DIAGNOSIS — N184 Chronic kidney disease, stage 4 (severe): Secondary | ICD-10-CM

## 2017-09-27 DIAGNOSIS — D500 Iron deficiency anemia secondary to blood loss (chronic): Secondary | ICD-10-CM

## 2017-09-27 LAB — IRON AND TIBC
Iron: 52 ug/dL (ref 28–170)
Saturation Ratios: 22 % (ref 10.4–31.8)
TIBC: 234 ug/dL — ABNORMAL LOW (ref 250–450)
UIBC: 182 ug/dL

## 2017-09-27 LAB — FERRITIN: Ferritin: 232 ng/mL (ref 11–307)

## 2017-09-27 LAB — VITAMIN D 25 HYDROXY (VIT D DEFICIENCY, FRACTURES): Vit D, 25-Hydroxy: 22.2 ng/mL — ABNORMAL LOW (ref 30.0–100.0)

## 2017-09-27 LAB — PARATHYROID HORMONE, INTACT (NO CA): PTH: 56 pg/mL (ref 15–65)

## 2017-09-30 ENCOUNTER — Inpatient Hospital Stay (HOSPITAL_COMMUNITY): Payer: Medicare HMO

## 2017-09-30 ENCOUNTER — Inpatient Hospital Stay (HOSPITAL_COMMUNITY): Payer: Medicare HMO | Attending: Hematology

## 2017-09-30 DIAGNOSIS — D649 Anemia, unspecified: Secondary | ICD-10-CM

## 2017-09-30 DIAGNOSIS — D500 Iron deficiency anemia secondary to blood loss (chronic): Secondary | ICD-10-CM | POA: Insufficient documentation

## 2017-09-30 DIAGNOSIS — I130 Hypertensive heart and chronic kidney disease with heart failure and stage 1 through stage 4 chronic kidney disease, or unspecified chronic kidney disease: Secondary | ICD-10-CM | POA: Insufficient documentation

## 2017-09-30 DIAGNOSIS — K922 Gastrointestinal hemorrhage, unspecified: Secondary | ICD-10-CM | POA: Insufficient documentation

## 2017-09-30 DIAGNOSIS — Z79899 Other long term (current) drug therapy: Secondary | ICD-10-CM | POA: Insufficient documentation

## 2017-09-30 DIAGNOSIS — N184 Chronic kidney disease, stage 4 (severe): Secondary | ICD-10-CM | POA: Insufficient documentation

## 2017-09-30 LAB — CBC WITH DIFFERENTIAL/PLATELET
BASOS ABS: 0 10*3/uL (ref 0.0–0.1)
Basophils Relative: 1 %
Eosinophils Absolute: 0.5 10*3/uL (ref 0.0–0.7)
Eosinophils Relative: 7 %
HEMATOCRIT: 34.5 % — AB (ref 36.0–46.0)
HEMOGLOBIN: 11.3 g/dL — AB (ref 12.0–15.0)
LYMPHS PCT: 20 %
Lymphs Abs: 1.4 10*3/uL (ref 0.7–4.0)
MCH: 29.5 pg (ref 26.0–34.0)
MCHC: 32.8 g/dL (ref 30.0–36.0)
MCV: 90.1 fL (ref 78.0–100.0)
MONO ABS: 0.5 10*3/uL (ref 0.1–1.0)
MONOS PCT: 7 %
NEUTROS ABS: 4.6 10*3/uL (ref 1.7–7.7)
NEUTROS PCT: 65 %
Platelets: 288 10*3/uL (ref 150–400)
RBC: 3.83 MIL/uL — ABNORMAL LOW (ref 3.87–5.11)
RDW: 14.4 % (ref 11.5–15.5)
WBC: 7 10*3/uL (ref 4.0–10.5)

## 2017-09-30 NOTE — Progress Notes (Signed)
Reviewed hgb 11.3 with the patient and no need for aranesp today.  Copy of lab work given to the patient and reminded to keep next scheduled appointment.  No complaints voiced by the patient.  No s/s of distress noted.

## 2017-10-01 DIAGNOSIS — D638 Anemia in other chronic diseases classified elsewhere: Secondary | ICD-10-CM | POA: Diagnosis not present

## 2017-10-01 DIAGNOSIS — I509 Heart failure, unspecified: Secondary | ICD-10-CM | POA: Diagnosis not present

## 2017-10-01 DIAGNOSIS — R809 Proteinuria, unspecified: Secondary | ICD-10-CM | POA: Diagnosis not present

## 2017-10-01 DIAGNOSIS — I10 Essential (primary) hypertension: Secondary | ICD-10-CM | POA: Diagnosis not present

## 2017-10-01 DIAGNOSIS — E1129 Type 2 diabetes mellitus with other diabetic kidney complication: Secondary | ICD-10-CM | POA: Diagnosis not present

## 2017-10-01 DIAGNOSIS — N184 Chronic kidney disease, stage 4 (severe): Secondary | ICD-10-CM | POA: Diagnosis not present

## 2017-10-02 ENCOUNTER — Ambulatory Visit: Payer: Medicare HMO | Admitting: Gastroenterology

## 2017-10-02 ENCOUNTER — Encounter

## 2017-10-08 ENCOUNTER — Encounter: Payer: Self-pay | Admitting: Gastroenterology

## 2017-10-08 ENCOUNTER — Ambulatory Visit: Payer: Medicare HMO | Admitting: Gastroenterology

## 2017-10-08 ENCOUNTER — Encounter: Payer: Self-pay | Admitting: Family Medicine

## 2017-10-08 VITALS — BP 130/69 | HR 80 | Temp 97.1°F | Ht 64.0 in | Wt 180.6 lb

## 2017-10-08 DIAGNOSIS — D123 Benign neoplasm of transverse colon: Secondary | ICD-10-CM | POA: Diagnosis not present

## 2017-10-08 DIAGNOSIS — K635 Polyp of colon: Secondary | ICD-10-CM | POA: Insufficient documentation

## 2017-10-08 DIAGNOSIS — D500 Iron deficiency anemia secondary to blood loss (chronic): Secondary | ICD-10-CM

## 2017-10-08 DIAGNOSIS — K921 Melena: Secondary | ICD-10-CM

## 2017-10-08 NOTE — Patient Instructions (Signed)
1. Continue to monitor for black or bloody stools.  2. We will see you back in three months and consider colonoscopy to remove the colon polyp if you are ready.  3. Continue to follow with the hematologist for your anemia.

## 2017-10-08 NOTE — Progress Notes (Signed)
Primary Care Physician: Caren Macadam, MD  Primary Gastroenterologist:  Barney Drain, MD   Chief Complaint  Patient presents with  . hfu    improved, doing better. Has not seen any blood recently.     HPI: [name] is a 77 y.o. female here for hospital follow up from 07/2017 when she presented with 10 day h/o melena. Hgb dropped to 7/5. Required blood transfusion. H/H at discharge was 10.2.   EGD/TCS 09/2016 in [place]: mild chronic gastritis but no H.pylori, 4mm transverse colon polyp NOT resected due to Plavix, hemorrhoids.   She had had givens capsule in 09/2016 and 10/2016 unremarkable.   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   Hgb 11.3 most recently 09/30/17. Clinically she reports feeling well. No melena, brbpr. She continues to follow with hematology and nephrology. She has not needed Aranesp with Hgb remaining above 11. Denies abd pain. No heartburn. She is not ready for colonoscopy to have colon polyp removed.      Current Outpatient Medications  Medication Sig Dispense Refill  . acetaminophen (TYLENOL) 325 MG tablet Take 2 tablets (650 mg total) by mouth every 6 (six) hours as needed for mild pain or headache (or Fever >/= 101). 40 tablet 0  . amLODipine (NORVASC) 10 MG tablet Take 1 tablet (10 mg total) by mouth daily. 90 tablet 3  . docusate sodium (COLACE) 100 MG capsule Take 1 capsule (100 mg total) by mouth every 12 (twelve) hours. (Patient taking differently: Take 100 mg by mouth daily. ) 60 capsule 0  . furosemide (LASIX) 20 MG tablet Take 20 mg by mouth as needed. Only has to take it when she gains 2 lbs    . gabapentin (NEURONTIN) 300 MG capsule Take 2 capsules (600 mg total) by mouth at bedtime. 180 capsule 1  . hydrALAZINE  (APRESOLINE) 25 MG tablet TAKE 1 TABLET BY MOUTH EVERY 8 HOURS AS NEEDED 30 tablet 3  . Insulin Glargine (TOUJEO MAX SOLOSTAR) 300 UNIT/ML SOPN Inject 20 Units at bedtime into the skin. (Patient taking differently: Inject 24 Units into the skin at bedtime. ) 3 pen 2  . levothyroxine (SYNTHROID, LEVOTHROID) 50 MCG tablet TAKE 1 TABLET BY MOUTH ONCE DAILY BEFORE  BREAKFAST 30 tablet 3  . metoprolol tartrate (LOPRESSOR) 25 MG tablet Take 12.5 mg by mouth 2 (two) times daily.    . Multiple Vitamin (MULTIVITAMIN WITH MINERALS) TABS tablet Take 1 tablet by mouth daily.    . ondansetron (ZOFRAN) 4 MG tablet Take 1 tablet (4 mg total) by mouth every 8 (eight) hours as needed for nausea or vomiting. 20 tablet 1  . pantoprazole (PROTONIX) 40 MG tablet Take 1 tablet (40 mg total) by mouth 2 (two) times daily. 30 minutes before breakfast 60 tablet 1   No current facility-administered medications for this visit.     Allergies as of 10/08/2017 - Review Complete 10/08/2017  Allergen Reaction Noted  . Insulin glargine Swelling 09/18/2016  . Statins Other (See Comments) 09/18/2016    ROS:  General: Negative for anorexia, weight loss, fever, chills, fatigue, weakness. ENT: Negative for hoarseness, difficulty swallowing , nasal congestion. CV: Negative for chest pain, angina, palpitations, dyspnea on exertion, peripheral edema.  Respiratory: Negative for dyspnea at rest, dyspnea on exertion, cough, sputum, wheezing.  GI: See history of present illness. GU:  Negative for dysuria, hematuria, urinary incontinence, urinary frequency, nocturnal urination.  Endo: Negative for unusual weight change.    Physical Examination:   BP 130/69   Pulse 80   Temp (!) 97.1 F (36.2 C) (Oral)   Ht 5\' 4"  (1.626 m)   Wt 180 lb 9.6 oz (81.9 kg)   BMI 31.00 kg/m   General: Well-nourished, well-developed in no acute distress.  Eyes: No icterus. Mouth: Oropharyngeal mucosa moist and pink  Abdomen: Bowel sounds are  normal, nontender, nondistended, no hepatosplenomegaly or masses, no abdominal bruits or hernia  Extremities: No lower extremity edema. No clubbing or deformities. Neuro: Alert and oriented x 4   Skin: Warm and dry, no jaundice.   Psych: Alert and cooperative, normal mood and affect.  Labs:  Lab Results  Component Value Date   CREATININE 2.55 (H) 09/26/2017   BUN 30 (H) 09/26/2017   NA 141 09/26/2017   K 4.3 09/26/2017   CL 105 09/26/2017   CO2 23 09/26/2017   Lab Results  Component Value Date   ALT 13 (L) 08/30/2017   AST 21 08/30/2017   ALKPHOS 66 08/30/2017   BILITOT 0.7 08/30/2017   Lab Results  Component Value Date   WBC 7.0 09/30/2017   HGB 11.3 (L) 09/30/2017   HCT 34.5 (L) 09/30/2017   MCV 90.1 09/30/2017   PLT 288 09/30/2017   Lab Results  Component Value Date   IRON 52 09/26/2017   TIBC 234 (L) 09/26/2017   FERRITIN 232 09/26/2017    Imaging Studies: No results found.

## 2017-10-09 ENCOUNTER — Other Ambulatory Visit (HOSPITAL_COMMUNITY): Payer: Self-pay

## 2017-10-09 DIAGNOSIS — D500 Iron deficiency anemia secondary to blood loss (chronic): Secondary | ICD-10-CM

## 2017-10-09 DIAGNOSIS — N184 Chronic kidney disease, stage 4 (severe): Secondary | ICD-10-CM

## 2017-10-10 ENCOUNTER — Other Ambulatory Visit: Payer: Self-pay | Admitting: "Endocrinology

## 2017-10-10 ENCOUNTER — Other Ambulatory Visit: Payer: Self-pay

## 2017-10-10 ENCOUNTER — Telehealth: Payer: Self-pay

## 2017-10-10 MED ORDER — LEVOTHYROXINE SODIUM 50 MCG PO TABS: ORAL_TABLET | ORAL | 3 refills | Status: DC

## 2017-10-10 MED ORDER — METOPROLOL TARTRATE 25 MG PO TABS: 12.5000 mg | ORAL_TABLET | Freq: Two times a day (BID) | ORAL | 1 refills | Status: DC

## 2017-10-10 MED ORDER — HYDRALAZINE HCL 25 MG PO TABS: 25.0000 mg | ORAL_TABLET | Freq: Three times a day (TID) | ORAL | 0 refills | Status: DC | PRN

## 2017-10-10 NOTE — Telephone Encounter (Signed)
Patient is former Designer, industrial/product patient. Is requesting refill of Hydralazine. Please advise

## 2017-10-10 NOTE — Telephone Encounter (Signed)
Please refill the medication for 2 months and make sure that she understands that she needs to schedule a follow up. Gwen Her. Mannie Stabile, MD

## 2017-10-10 NOTE — Telephone Encounter (Signed)
Done. Hsas appt June 14

## 2017-10-10 NOTE — Telephone Encounter (Signed)
Pt is requesting Levothyroxine 59mcg. The notes states she should be taking 159mcg?

## 2017-10-10 NOTE — Telephone Encounter (Signed)
125 mcg seems to eb an error. I will send a refill for 40mcg .

## 2017-10-13 NOTE — Assessment & Plan Note (Signed)
H/H has been stable after Dieulafoy lesion treated. She will continue to follow with hematology.

## 2017-10-13 NOTE — Assessment & Plan Note (Signed)
Patient does not want to pursue colonoscopy at this time. She will consider after next ov in 3 months.

## 2017-10-14 ENCOUNTER — Inpatient Hospital Stay (HOSPITAL_COMMUNITY): Payer: Medicare HMO

## 2017-10-14 ENCOUNTER — Inpatient Hospital Stay (HOSPITAL_COMMUNITY): Admit: 2017-10-14 | Payer: Self-pay

## 2017-10-14 DIAGNOSIS — D500 Iron deficiency anemia secondary to blood loss (chronic): Secondary | ICD-10-CM

## 2017-10-14 DIAGNOSIS — N184 Chronic kidney disease, stage 4 (severe): Secondary | ICD-10-CM

## 2017-10-14 DIAGNOSIS — Z79899 Other long term (current) drug therapy: Secondary | ICD-10-CM | POA: Diagnosis not present

## 2017-10-14 DIAGNOSIS — I130 Hypertensive heart and chronic kidney disease with heart failure and stage 1 through stage 4 chronic kidney disease, or unspecified chronic kidney disease: Secondary | ICD-10-CM | POA: Diagnosis not present

## 2017-10-14 DIAGNOSIS — K922 Gastrointestinal hemorrhage, unspecified: Secondary | ICD-10-CM | POA: Diagnosis not present

## 2017-10-14 LAB — CBC WITH DIFFERENTIAL/PLATELET
BASOS PCT: 1 %
Basophils Absolute: 0.1 10*3/uL (ref 0.0–0.1)
EOS ABS: 0.4 10*3/uL (ref 0.0–0.7)
EOS PCT: 6 %
HCT: 33.5 % — ABNORMAL LOW (ref 36.0–46.0)
Hemoglobin: 10.8 g/dL — ABNORMAL LOW (ref 12.0–15.0)
Lymphocytes Relative: 19 %
Lymphs Abs: 1.3 10*3/uL (ref 0.7–4.0)
MCH: 29.2 pg (ref 26.0–34.0)
MCHC: 32.2 g/dL (ref 30.0–36.0)
MCV: 90.5 fL (ref 78.0–100.0)
MONO ABS: 0.5 10*3/uL (ref 0.1–1.0)
Monocytes Relative: 8 %
Neutro Abs: 4.5 10*3/uL (ref 1.7–7.7)
Neutrophils Relative %: 66 %
PLATELETS: 292 10*3/uL (ref 150–400)
RBC: 3.7 MIL/uL — ABNORMAL LOW (ref 3.87–5.11)
RDW: 14.5 % (ref 11.5–15.5)
WBC: 6.8 10*3/uL (ref 4.0–10.5)

## 2017-10-14 MED ORDER — DARBEPOETIN ALFA 200 MCG/0.4ML IJ SOSY: 200.0000 ug | PREFILLED_SYRINGE | Freq: Once | INTRAMUSCULAR | Status: DC

## 2017-10-14 NOTE — Progress Notes (Unsigned)
Prior auth not obtained for Aranesp just yet. Injection not given today. Angie to follow up and schedule pt for injection later this week. Patient given Angie and Amy's contact information. HGB 10.8 today.

## 2017-10-14 NOTE — Progress Notes (Signed)
cc'ed to pcp °

## 2017-10-16 ENCOUNTER — Ambulatory Visit: Payer: Medicare HMO | Admitting: "Endocrinology

## 2017-10-17 ENCOUNTER — Observation Stay (HOSPITAL_COMMUNITY)
Admission: EM | Admit: 2017-10-17 | Discharge: 2017-10-18 | Disposition: A | Discharge status: Home / Self Care | Payer: Medicare HMO | Attending: Internal Medicine | Admitting: Internal Medicine

## 2017-10-17 ENCOUNTER — Emergency Department (HOSPITAL_COMMUNITY): Payer: Medicare HMO

## 2017-10-17 ENCOUNTER — Other Ambulatory Visit: Payer: Self-pay

## 2017-10-17 ENCOUNTER — Ambulatory Visit (HOSPITAL_COMMUNITY): Payer: Medicare HMO

## 2017-10-17 ENCOUNTER — Encounter (HOSPITAL_COMMUNITY): Payer: Self-pay | Admitting: *Deleted

## 2017-10-17 ENCOUNTER — Observation Stay (HOSPITAL_BASED_OUTPATIENT_CLINIC_OR_DEPARTMENT_OTHER): Payer: Medicare HMO

## 2017-10-17 DIAGNOSIS — R0600 Dyspnea, unspecified: Secondary | ICD-10-CM | POA: Diagnosis present

## 2017-10-17 DIAGNOSIS — E1169 Type 2 diabetes mellitus with other specified complication: Secondary | ICD-10-CM | POA: Diagnosis present

## 2017-10-17 DIAGNOSIS — E039 Hypothyroidism, unspecified: Secondary | ICD-10-CM | POA: Diagnosis not present

## 2017-10-17 DIAGNOSIS — I252 Old myocardial infarction: Secondary | ICD-10-CM | POA: Diagnosis not present

## 2017-10-17 DIAGNOSIS — N184 Chronic kidney disease, stage 4 (severe): Secondary | ICD-10-CM | POA: Diagnosis not present

## 2017-10-17 DIAGNOSIS — E785 Hyperlipidemia, unspecified: Secondary | ICD-10-CM | POA: Diagnosis not present

## 2017-10-17 DIAGNOSIS — I130 Hypertensive heart and chronic kidney disease with heart failure and stage 1 through stage 4 chronic kidney disease, or unspecified chronic kidney disease: Secondary | ICD-10-CM | POA: Diagnosis not present

## 2017-10-17 DIAGNOSIS — R079 Chest pain, unspecified: Secondary | ICD-10-CM | POA: Diagnosis not present

## 2017-10-17 DIAGNOSIS — I110 Hypertensive heart disease with heart failure: Secondary | ICD-10-CM | POA: Diagnosis not present

## 2017-10-17 DIAGNOSIS — J9601 Acute respiratory failure with hypoxia: Principal | ICD-10-CM

## 2017-10-17 DIAGNOSIS — I509 Heart failure, unspecified: Secondary | ICD-10-CM

## 2017-10-17 DIAGNOSIS — R748 Abnormal levels of other serum enzymes: Secondary | ICD-10-CM | POA: Diagnosis not present

## 2017-10-17 DIAGNOSIS — D649 Anemia, unspecified: Secondary | ICD-10-CM | POA: Diagnosis present

## 2017-10-17 DIAGNOSIS — Z96642 Presence of left artificial hip joint: Secondary | ICD-10-CM | POA: Insufficient documentation

## 2017-10-17 DIAGNOSIS — R0602 Shortness of breath: Secondary | ICD-10-CM | POA: Diagnosis not present

## 2017-10-17 DIAGNOSIS — R0902 Hypoxemia: Secondary | ICD-10-CM | POA: Diagnosis not present

## 2017-10-17 DIAGNOSIS — Z79899 Other long term (current) drug therapy: Secondary | ICD-10-CM | POA: Insufficient documentation

## 2017-10-17 DIAGNOSIS — R7989 Other specified abnormal findings of blood chemistry: Secondary | ICD-10-CM

## 2017-10-17 DIAGNOSIS — E1122 Type 2 diabetes mellitus with diabetic chronic kidney disease: Secondary | ICD-10-CM | POA: Diagnosis present

## 2017-10-17 DIAGNOSIS — I169 Hypertensive crisis, unspecified: Secondary | ICD-10-CM | POA: Diagnosis present

## 2017-10-17 DIAGNOSIS — I5042 Chronic combined systolic (congestive) and diastolic (congestive) heart failure: Secondary | ICD-10-CM | POA: Diagnosis not present

## 2017-10-17 DIAGNOSIS — R778 Other specified abnormalities of plasma proteins: Secondary | ICD-10-CM

## 2017-10-17 DIAGNOSIS — Z794 Long term (current) use of insulin: Secondary | ICD-10-CM | POA: Insufficient documentation

## 2017-10-17 DIAGNOSIS — I10 Essential (primary) hypertension: Secondary | ICD-10-CM | POA: Diagnosis present

## 2017-10-17 LAB — ECHOCARDIOGRAM COMPLETE
Height: 64 in
Weight: 2857.16 oz

## 2017-10-17 LAB — CBC WITH DIFFERENTIAL/PLATELET
BASOS ABS: 0 10*3/uL (ref 0.0–0.1)
Basophils Relative: 0 %
EOS PCT: 3 %
Eosinophils Absolute: 0.3 10*3/uL (ref 0.0–0.7)
HEMATOCRIT: 32.4 % — AB (ref 36.0–46.0)
Hemoglobin: 10.5 g/dL — ABNORMAL LOW (ref 12.0–15.0)
LYMPHS ABS: 1.1 10*3/uL (ref 0.7–4.0)
Lymphocytes Relative: 11 %
MCH: 29.6 pg (ref 26.0–34.0)
MCHC: 32.4 g/dL (ref 30.0–36.0)
MCV: 91.3 fL (ref 78.0–100.0)
Monocytes Absolute: 0.7 10*3/uL (ref 0.1–1.0)
Monocytes Relative: 7 %
NEUTROS ABS: 7.9 10*3/uL — AB (ref 1.7–7.7)
Neutrophils Relative %: 79 %
PLATELETS: 289 10*3/uL (ref 150–400)
RBC: 3.55 MIL/uL — AB (ref 3.87–5.11)
RDW: 14.7 % (ref 11.5–15.5)
WBC: 10 10*3/uL (ref 4.0–10.5)

## 2017-10-17 LAB — COMPREHENSIVE METABOLIC PANEL
ALK PHOS: 61 U/L (ref 38–126)
ALT: 12 U/L — AB (ref 14–54)
AST: 15 U/L (ref 15–41)
Albumin: 3.5 g/dL (ref 3.5–5.0)
Anion gap: 10 (ref 5–15)
BILIRUBIN TOTAL: 0.8 mg/dL (ref 0.3–1.2)
BUN: 49 mg/dL — AB (ref 6–20)
CO2: 24 mmol/L (ref 22–32)
CREATININE: 2.56 mg/dL — AB (ref 0.44–1.00)
Calcium: 8.9 mg/dL (ref 8.9–10.3)
Chloride: 108 mmol/L (ref 101–111)
GFR, EST AFRICAN AMERICAN: 20 mL/min — AB (ref >60)
GFR, EST NON AFRICAN AMERICAN: 17 mL/min — AB (ref >60)
Glucose, Bld: 119 mg/dL — ABNORMAL HIGH (ref 65–99)
Potassium: 4.2 mmol/L (ref 3.5–5.1)
Sodium: 142 mmol/L (ref 135–145)
Total Protein: 6.5 g/dL (ref 6.5–8.1)

## 2017-10-17 LAB — TROPONIN I
Troponin I: 0.49 ng/mL (ref <0.03)
Troponin I: 0.53 ng/mL (ref <0.03)
Troponin I: 0.71 ng/mL (ref <0.03)
Troponin I: 0.73 ng/mL (ref <0.03)

## 2017-10-17 LAB — GLUCOSE, CAPILLARY
Glucose-Capillary: 110 mg/dL — ABNORMAL HIGH (ref 65–99)
Glucose-Capillary: 109 mg/dL — ABNORMAL HIGH (ref 65–99)
Glucose-Capillary: 123 mg/dL — ABNORMAL HIGH (ref 65–99)
Glucose-Capillary: 190 mg/dL — ABNORMAL HIGH (ref 65–99)

## 2017-10-17 LAB — CBC
HEMATOCRIT: 32.5 % — AB (ref 36.0–46.0)
Hemoglobin: 10.5 g/dL — ABNORMAL LOW (ref 12.0–15.0)
MCH: 29.6 pg (ref 26.0–34.0)
MCHC: 32.3 g/dL (ref 30.0–36.0)
MCV: 91.5 fL (ref 78.0–100.0)
PLATELETS: 280 10*3/uL (ref 150–400)
RBC: 3.55 MIL/uL — ABNORMAL LOW (ref 3.87–5.11)
RDW: 14.6 % (ref 11.5–15.5)
WBC: 9.4 10*3/uL (ref 4.0–10.5)

## 2017-10-17 LAB — MRSA PCR SCREENING: MRSA BY PCR: NEGATIVE

## 2017-10-17 LAB — CREATININE, SERUM
Creatinine, Ser: 2.52 mg/dL — ABNORMAL HIGH (ref 0.44–1.00)
GFR, EST AFRICAN AMERICAN: 20 mL/min — AB (ref >60)
GFR, EST NON AFRICAN AMERICAN: 17 mL/min — AB (ref >60)

## 2017-10-17 LAB — BRAIN NATRIURETIC PEPTIDE: B Natriuretic Peptide: 682 pg/mL — ABNORMAL HIGH (ref 0.0–100.0)

## 2017-10-17 MED ORDER — FUROSEMIDE 10 MG/ML IJ SOLN
40.0000 mg | Freq: Once | INTRAMUSCULAR | Status: AC
  Administered 2017-10-17: 40 mg via INTRAVENOUS
  Filled 2017-10-17: qty 4

## 2017-10-17 MED ORDER — HEPARIN SODIUM (PORCINE) 5000 UNIT/ML IJ SOLN: 5000.0000 [IU] | Freq: Three times a day (TID) | INTRAMUSCULAR | Status: DC

## 2017-10-17 MED ORDER — DOCUSATE SODIUM 100 MG PO CAPS
100.0000 mg | ORAL_CAPSULE | Freq: Every day | ORAL | Status: DC
  Administered 2017-10-17 – 2017-10-18 (×2): 100 mg via ORAL
  Filled 2017-10-17 (×2): qty 1

## 2017-10-17 MED ORDER — SODIUM CHLORIDE 0.9% FLUSH: 3.0000 mL | INTRAVENOUS | Status: DC | PRN

## 2017-10-17 MED ORDER — INSULIN GLARGINE 300 UNIT/ML ~~LOC~~ SOPN
24.0000 [IU] | PEN_INJECTOR | Freq: Every day | SUBCUTANEOUS | Status: DC
  Administered 2017-10-17: 24 [IU] via SUBCUTANEOUS

## 2017-10-17 MED ORDER — PANTOPRAZOLE SODIUM 40 MG PO TBEC
40.0000 mg | DELAYED_RELEASE_TABLET | Freq: Two times a day (BID) | ORAL | Status: DC
  Administered 2017-10-17 – 2017-10-18 (×3): 40 mg via ORAL
  Filled 2017-10-17 (×3): qty 1

## 2017-10-17 MED ORDER — SODIUM CHLORIDE 0.9 % IV SOLN: 250.0000 mL | INTRAVENOUS | Status: DC | PRN

## 2017-10-17 MED ORDER — ASPIRIN EC 81 MG PO TBEC
81.0000 mg | DELAYED_RELEASE_TABLET | Freq: Every day | ORAL | Status: DC
  Administered 2017-10-17 – 2017-10-18 (×2): 81 mg via ORAL
  Filled 2017-10-17 (×2): qty 1

## 2017-10-17 MED ORDER — FUROSEMIDE 10 MG/ML IJ SOLN
40.0000 mg | Freq: Two times a day (BID) | INTRAMUSCULAR | Status: DC
  Administered 2017-10-17: 40 mg via INTRAVENOUS
  Filled 2017-10-17: qty 4

## 2017-10-17 MED ORDER — LEVOTHYROXINE SODIUM 50 MCG PO TABS
50.0000 ug | ORAL_TABLET | Freq: Every day | ORAL | Status: DC
  Administered 2017-10-17 – 2017-10-18 (×2): 50 ug via ORAL
  Filled 2017-10-17: qty 2
  Filled 2017-10-17: qty 1

## 2017-10-17 MED ORDER — ACETAMINOPHEN 325 MG PO TABS: 650.0000 mg | ORAL_TABLET | Freq: Four times a day (QID) | ORAL | Status: DC | PRN

## 2017-10-17 MED ORDER — ADULT MULTIVITAMIN W/MINERALS CH
1.0000 | ORAL_TABLET | Freq: Every day | ORAL | Status: DC
  Administered 2017-10-17 – 2017-10-18 (×2): 1 via ORAL
  Filled 2017-10-17 (×2): qty 1

## 2017-10-17 MED ORDER — ACETAMINOPHEN 650 MG RE SUPP: 650.0000 mg | Freq: Four times a day (QID) | RECTAL | Status: DC | PRN

## 2017-10-17 MED ORDER — SODIUM CHLORIDE 0.9% FLUSH
3.0000 mL | Freq: Two times a day (BID) | INTRAVENOUS | Status: DC
  Administered 2017-10-17 – 2017-10-18 (×3): 3 mL via INTRAVENOUS

## 2017-10-17 MED ORDER — INSULIN ASPART 100 UNIT/ML ~~LOC~~ SOLN
0.0000 [IU] | Freq: Three times a day (TID) | SUBCUTANEOUS | Status: DC
  Administered 2017-10-17: 1 [IU] via SUBCUTANEOUS
  Administered 2017-10-18: 2 [IU] via SUBCUTANEOUS

## 2017-10-17 MED ORDER — METOPROLOL TARTRATE 25 MG PO TABS
12.5000 mg | ORAL_TABLET | Freq: Two times a day (BID) | ORAL | Status: DC
  Administered 2017-10-17 – 2017-10-18 (×3): 12.5 mg via ORAL
  Filled 2017-10-17 (×3): qty 1

## 2017-10-17 MED ORDER — FUROSEMIDE 10 MG/ML IJ SOLN
60.0000 mg | Freq: Two times a day (BID) | INTRAMUSCULAR | Status: DC
  Administered 2017-10-17 – 2017-10-18 (×2): 60 mg via INTRAVENOUS
  Filled 2017-10-17 (×2): qty 6

## 2017-10-17 MED ORDER — GABAPENTIN 300 MG PO CAPS
600.0000 mg | ORAL_CAPSULE | Freq: Every day | ORAL | Status: DC
  Administered 2017-10-17: 600 mg via ORAL
  Filled 2017-10-17: qty 2

## 2017-10-17 MED ORDER — INSULIN ASPART 100 UNIT/ML ~~LOC~~ SOLN: 0.0000 [IU] | Freq: Every day | SUBCUTANEOUS | Status: DC

## 2017-10-17 MED ORDER — HYDRALAZINE HCL 25 MG PO TABS
25.0000 mg | ORAL_TABLET | Freq: Three times a day (TID) | ORAL | Status: DC
  Administered 2017-10-17 – 2017-10-18 (×4): 25 mg via ORAL
  Filled 2017-10-17 (×4): qty 1

## 2017-10-17 MED ORDER — AMLODIPINE BESYLATE 5 MG PO TABS
10.0000 mg | ORAL_TABLET | Freq: Every day | ORAL | Status: DC
  Administered 2017-10-17 – 2017-10-18 (×2): 10 mg via ORAL
  Filled 2017-10-17 (×2): qty 2

## 2017-10-17 MED ORDER — ONDANSETRON HCL 4 MG PO TABS
4.0000 mg | ORAL_TABLET | Freq: Three times a day (TID) | ORAL | Status: DC | PRN
Start: 2017-10-17 — End: 2017-10-18

## 2017-10-17 NOTE — Progress Notes (Signed)
Patient arrived to floor via wheelchair. Assisted to bed.

## 2017-10-17 NOTE — Progress Notes (Signed)
PROGRESS NOTE    [name]  RSW:[id number] DOB: August 20, 1940 DOA: 10/17/2017 PCP: Arnoldo Lenis, MD    Brief Narrative:  77 year old with past medical history relevant for type 2 diabetes on insulin, hypothyroidism, hypertension, hyperlipidemia, stage IV chronic kidney disease, gout, anemia of chronic disease who comes in with hypoxia and found to have pulmonary edema and elevated troponin.   Assessment & Plan:   Principal Problem:   Acute hypoxemic respiratory failure (HCC) Active Problems:   Diabetes mellitus with stage 4 chronic kidney disease (HCC)   HTN (hypertension)   History of non-ST elevation myocardial infarction (NSTEMI)   Hyperlipidemia associated with type 2 diabetes mellitus (HCC)   Elevated troponin   Hypothyroidism   Anemia   Dyspnea   #) Acute hypoxic respiratory failure: Patient noted to have pulmonary edema on chest x-ray.  Suspect likely secondary to either acute systolic or diastolic heart failure.  Her last echo approximately 1 year ago in 2018 was unremarkable. -Strict ins and outs, weigh daily, sodium, renal diet, fluid restriction -IV furosemide 60 mg twice daily Telemetry -Echo pending  #) Elevated troponin: Patient reportedly had elevated troponin approximately 1 year ago which was in the setting of significant anemia.  At this time suspect that she likely has fluid overload possibly from her progressive renal failure and that this is causing the elevated troponin.  She has no chest pain and her EKG does not show any ST segment changes. - Check echo -We will hold on heparin at this time pending echo -Continue cycle troponins until downtrending  #) Stage IV CKD: Patient CKD appears to be stable at this time.  She has a mildly elevated BUN however she has had this level before in the past. -Hold nephrotoxins  #) Hypertension: -Continue amlodipine 10 mg daily -Hold home furosemide 20 mg as needed -Continue hydralazine 25 mg 3 times  daily -Continue metoprolol tartrate 12.5 mg twice daily  #) Type 2 diabetes on insulin: -Continue glargine 3R 24 units nightly -Sliding scale insulin, AC at bedtime  #) Hypothyroidism: -Continue levothyroxine 50 mcg  #) Pain/psych: -Continue gabapentin 600 mg nightly  Fluids: Restrict Electrolytes: Monitor and supplement Nutrition: Renal, fluid restricted diet  Prophylaxis: Subcu heparin  Disposition: Pending baseline respiratory status and echo  Full code    Consultants:   None  Procedures: (Don't include imaging studies which can be auto populated. Include things that cannot be auto populated i.e. Echo, Carotid and venous dopplers, Foley, Bipap, HD, tubes/drains, wound vac, central lines etc)  10/17/2017: Echo pending  Antimicrobials: (specify start and planned stop date. Auto populated tables are space occupying and do not give end dates)  1   Subjective: Patient reports that she is doing better.  Her breathing is significantly improved and her shortness of breath is getting better.  She denies any nausea, vomiting, chest pain.  Of note she did exercise 3 days ago without any significant chest pain.  Objective: Vitals:   10/17/17 0800 10/17/17 1000 10/17/17 1100 10/17/17 1122  BP:   (!) 114/40   Pulse:  91 83 84  Resp:  20 20 20   Temp: 99.3 F (37.4 C)   99.5 F (37.5 C)  TempSrc: Oral   Oral  SpO2:  90% (!) 89% (!) 86%  Weight:      Height:       No intake or output data in the 24 hours ending 10/17/17 1140 Filed Weights   10/17/17 0101 10/17/17 0620  Weight: 81.6  kg (180 lb) 81 kg (178 lb 9.2 oz)    Examination:  General exam: Appears calm and comfortable  Respiratory system: Mildly increased work of breathing, scattered crackles at bilateral bases, intermittent rhonchi, no wheezes Cardiovascular system: Distant heart sounds, regular rate and rhythm, no murmurs Gastrointestinal system: Soft, nondistended, plus bowel sounds, no rebound or  guarding Central nervous system: Alert and oriented. No focal neurological deficits. Extremities: 1+ lower extremity edema Skin: No rashes on visible skin Psychiatry: Judgement and insight appear normal. Mood & affect appropriate.     Data Reviewed: I have personally reviewed following labs and imaging studies  CBC: Recent Labs  Lab 10/14/17 1033 10/17/17 0216 10/17/17 0452  WBC 6.8 10.0 9.4  NEUTROABS 4.5 7.9*  --   HGB 10.8* 10.5* 10.5*  HCT 33.5* 32.4* 32.5*  MCV 90.5 91.3 91.5  PLT 292 289 962   Basic Metabolic Panel: Recent Labs  Lab 10/17/17 0216 10/17/17 0452  NA 142  --   K 4.2  --   CL 108  --   CO2 24  --   GLUCOSE 119*  --   BUN 49*  --   CREATININE 2.56* 2.52*  CALCIUM 8.9  --    GFR: Estimated Creatinine Clearance: 19.2 mL/min (A) (by C-G formula based on SCr of 2.52 mg/dL (H)). Liver Function Tests: Recent Labs  Lab 10/17/17 0216  AST 15  ALT 12*  ALKPHOS 61  BILITOT 0.8  PROT 6.5  ALBUMIN 3.5   No results for input(s): LIPASE, AMYLASE in the last 168 hours. No results for input(s): AMMONIA in the last 168 hours. Coagulation Profile: No results for input(s): INR, PROTIME in the last 168 hours. Cardiac Enzymes: Recent Labs  Lab 10/17/17 0216 10/17/17 0452 10/17/17 1024  TROPONINI 0.49* 0.53* 0.71*   BNP (last 3 results) No results for input(s): PROBNP in the last 8760 hours. HbA1C: No results for input(s): HGBA1C in the last 72 hours. CBG: Recent Labs  Lab 10/17/17 0722  GLUCAP 110*   Lipid Profile: No results for input(s): CHOL, HDL, LDLCALC, TRIG, CHOLHDL, LDLDIRECT in the last 72 hours. Thyroid Function Tests: No results for input(s): TSH, T4TOTAL, FREET4, T3FREE, THYROIDAB in the last 72 hours. Anemia Panel: No results for input(s): VITAMINB12, FOLATE, FERRITIN, TIBC, IRON, RETICCTPCT in the last 72 hours. Sepsis Labs: No results for input(s): PROCALCITON, LATICACIDVEN in the last 168 hours.  No results found for this  or any previous visit (from the past 240 hour(s)).       Radiology Studies: Dg Chest 2 View  Result Date: 10/17/2017 CLINICAL DATA:  Shortness of breath since yesterday. EXAM: CHEST - 2 VIEW COMPARISON:  03/10/2017 FINDINGS: Cardiomegaly appears similar. There is moderate interstitial edema. Small bilateral pleural effusions, left greater than right. Associated bibasilar opacities, with increased retrocardiac opacity on the lateral view. No pneumothorax. Postsurgical change in the left proximal humerus. IMPRESSION: 1. CHF with pulmonary edema and small pleural effusions, left greater than right. Cardiomegaly is similar. 2. Bibasilar opacities, favor compressive atelectasis related to pleural effusion, superimposed pneumonia is also considered. Electronically Signed   By: Jeb Levering M.D.   On: 10/17/2017 02:20        Scheduled Meds: . amLODipine  10 mg Oral Daily  . aspirin EC  81 mg Oral Daily  . docusate sodium  100 mg Oral Daily  . furosemide  40 mg Intravenous Q12H  . gabapentin  600 mg Oral QHS  . hydrALAZINE  25 mg Oral Q8H  .  insulin aspart  0-5 Units Subcutaneous QHS  . insulin aspart  0-9 Units Subcutaneous TID WC  . Insulin Glargine  24 Units Subcutaneous QHS  . levothyroxine  50 mcg Oral QAC breakfast  . metoprolol tartrate  12.5 mg Oral BID  . multivitamin with minerals  1 tablet Oral Daily  . pantoprazole  40 mg Oral BID  . sodium chloride flush  3 mL Intravenous Q12H   Continuous Infusions: . sodium chloride       LOS: 0 days    Time spent: [place], MD [facility]   If 7PM-7AM, please contact night-coverage [email or web address] Password [email or web address] 10/17/2017, 11:40 AM

## 2017-10-17 NOTE — ED Triage Notes (Signed)
Pt reports sob for most of the day. Pt's o2 on RA upon arrival to the ED is 86%.

## 2017-10-17 NOTE — ED Notes (Signed)
Patient given bed side commode at this time

## 2017-10-17 NOTE — ED Provider Notes (Signed)
[facility] EMERGENCY DEPARTMENT Provider Note   CSN: [id number] Arrival date & time: 10/17/17  0048     History   Chief Complaint Chief Complaint  Patient presents with  . Shortness of Breath    HPI [name] is a 77 y.o. female.  This patient is a 77 year old female with past medical history of chronic anemia, chronic renal insufficiency, CHF.  She presents today for evaluation of difficulty breathing.  This began yesterday and is worsening.  She has a home pulse oximetry monitor which has been reading lower than normal.  She denies any chest pain or productive cough.  She denies any fevers or chills.  She denies increased swelling of her legs.  She reports normal urine output.  It sounds as though she has received erythropoietin injections in the past, however has not recently required this due to her anemia stabilizing.  The history is provided by the patient.  Shortness of Breath  This is a new problem. The average episode lasts 2 days. The problem occurs continuously.The problem has been gradually worsening. Pertinent negatives include no fever, no sputum production, no chest pain, no leg pain and no leg swelling. She has tried nothing for the symptoms.    Past Medical History:  Diagnosis Date  . Anemia   . Arthritis   . Blood transfusion without reported diagnosis   . Cataract   . Chronic anemia   . Chronic combined systolic and diastolic CHF (congestive heart failure) ([place])    a. 2D echo 08/2016 at [facility]: EF 50-55% with inferior wall HK, impaired LV filling, fair study.  . CKD (chronic kidney disease), stage III ([place])   . Diabetes ([place])   . Gastritis   . GERD (gastroesophageal reflux disease)   . Gout   . HTN (hypertension)   . Hyperlipidemia   . Hypothyroidism   . Iron deficiency anemia 11/08/2016  . Normocytic anemia 10/26/2016  . NSTEMI (non-ST elevated myocardial infarction) ([place])    a. Complex admission 08/2016 - with severe hyperglycemia, AKI on CKD,  severe anemia down to Hgb 6.8, acute combined CHF, troponin of 8.5, cath deferred due to renal dysfunction.  . Thyroid disease     Patient Active Problem List   Diagnosis Date Noted  . Polyp of transverse colon 10/08/2017  . Anemia 08/07/2017  . GI bleed 07/30/2017  . Hyperkalemia 07/30/2017  . Essential hypertension, benign 12/13/2016  . Hypothyroidism 12/13/2016  . Iron deficiency anemia 11/08/2016  . Aortic atherosclerosis ([place]) 10/25/2016  . Symptomatic anemia 10/04/2016  . History of non-ST elevation myocardial infarction (NSTEMI) 10/04/2016  . Neuropathy 09/20/2016  . Chronic kidney disease, stage IV (severe) ([place]) 08/03/2015  . Hip fracture ([place]) 02/05/2015  . Diabetes mellitus with stage 4 chronic kidney disease ([place]) 02/05/2015  . HTN (hypertension) 02/05/2015  . Gout 02/05/2015  . Left humeral fracture 02/05/2015  . Hyperlipidemia associated with type 2 diabetes mellitus ([place]) 05/01/2013    Past Surgical History:  Procedure Laterality Date  . COLONOSCOPY WITH PROPOFOL N/A 10/08/2016   Procedure: COLONOSCOPY WITH PROPOFOL;  Surgeon: Ronnette Juniper, MD;  Location: [place];  Service: Gastroenterology;  Laterality: N/A;  . ENTEROSCOPY N/A 08/08/2017   Procedure: ENTEROSCOPY;  Surgeon: Daneil Dolin, MD;  [name];  Service: Endoscopy;  Laterality: N/A;  . ESOPHAGOGASTRODUODENOSCOPY N/A 10/06/2016   Procedure: ESOPHAGOGASTRODUODENOSCOPY (EGD);  Surgeon: Otis Brace, MD;  Location: [facility] ENDOSCOPY;  Service: Gastroenterology;  Laterality: N/A;  . ESOPHAGOGASTRODUODENOSCOPY (EGD) WITH PROPOFOL N/A 03/12/2017  Procedure: ESOPHAGOGASTRODUODENOSCOPY (EGD) WITH PROPOFOL;  Surgeon: Danie Binder, MD;  [name];  Service: Endoscopy;  Laterality: N/A;  . ESOPHAGOGASTRODUODENOSCOPY (EGD) WITH PROPOFOL N/A 08/08/2017   Procedure: ESOPHAGOGASTRODUODENOSCOPY (EGD) WITH PROPOFOL;  Surgeon: Daneil Dolin, MD;  [name];  Service:  Endoscopy;  Laterality: N/A;  use pediatric colonoscope per Dr. Gala Romney  . GIVENS CAPSULE STUDY  10/08/2016   Procedure: GIVENS CAPSULE STUDY;  Surgeon: Ronnette Juniper, MD;  Location: [place];  Service: Gastroenterology;;  . Freda Munro CAPSULE STUDY N/A 10/29/2016   Procedure: GIVENS CAPSULE STUDY;  Surgeon: Danie Binder, MD;  [name];  Service: Endoscopy;  Laterality: N/A;  . ORIF HUMERUS FRACTURE Left 02/07/2015   Procedure: OPEN REDUCTION INTERNAL FIXATION (ORIF) PROXIMAL HUMERUS FRACTURE;  Surgeon: Marybelle Killings, MD;  Location: [place];  Service: Orthopedics;  Laterality: Left;  . THYROID SURGERY    . TOTAL HIP ARTHROPLASTY Left 02/07/2015   Procedure: TOTAL HIP ARTHROPLASTY ANTERIOR APPROACH ;  Surgeon: Marybelle Killings, MD;  Location: [place];  Service: Orthopedics;  Laterality: Left;  . WRIST SURGERY Left      OB History   None      Home Medications    Prior to Admission medications   Medication Sig Start Date End Date Taking? Authorizing Provider  acetaminophen (TYLENOL) 325 MG tablet Take 2 tablets (650 mg total) by mouth every 6 (six) hours as needed for mild pain or headache (or Fever >/= 101). 07/31/17   Barton Dubois, MD  amLODipine (NORVASC) 10 MG tablet Take 1 tablet (10 mg total) by mouth daily. 01/25/17   [name], MD  docusate sodium (COLACE) 100 MG capsule Take 1 capsule (100 mg total) by mouth every 12 (twelve) hours. Patient taking differently: Take 100 mg by mouth daily.  06/24/17   Long, Wonda Olds, MD  furosemide (LASIX) 20 MG tablet Take 20 mg by mouth as needed. Only has to take it when she gains 2 lbs 09/20/16   [provider]  gabapentin (NEURONTIN) 300 MG capsule Take 2 capsules (600 mg total) by mouth at bedtime. 06/04/17   [name], MD  hydrALAZINE (APRESOLINE) 25 MG tablet Take 1 tablet (25 mg total) by mouth every 8 (eight) hours as needed. 10/10/17   Caren Macadam, MD  Insulin Glargine (TOUJEO MAX SOLOSTAR) 300 UNIT/ML SOPN Inject  20 Units at bedtime into the skin. Patient taking differently: Inject 24 Units into the skin at bedtime.  04/08/17   Cassandria Anger, MD  levothyroxine (SYNTHROID, LEVOTHROID) 50 MCG tablet TAKE 1 TABLET BY MOUTH ONCE DAILY BEFORE  BREAKFAST 10/10/17   Cassandria Anger, MD  metoprolol tartrate (LOPRESSOR) 25 MG tablet Take 0.5 tablets (12.5 mg total) by mouth 2 (two) times daily. 10/10/17   Caren Macadam, MD  Multiple Vitamin (MULTIVITAMIN WITH MINERALS) TABS tablet Take 1 tablet by mouth daily.    [provider]  ondansetron (ZOFRAN) 4 MG tablet Take 1 tablet (4 mg total) by mouth every 8 (eight) hours as needed for nausea or vomiting. 10/25/16   [name], MD  pantoprazole (PROTONIX) 40 MG tablet Take 1 tablet (40 mg total) by mouth 2 (two) times daily. 30 minutes before breakfast 07/31/17   Barton Dubois, MD    Family History Family History  Problem Relation Age of Onset  . Diabetes Mother   . Asthma Mother   . Early death Mother 12  Pneumonia  . Early death Father        Killed at rodeo  . CAD Neg Hx   . GI Bleed Neg Hx     Social History Social History   Tobacco Use  . Smoking status: Never Smoker  . Smokeless tobacco: Never Used  Substance Use Topics  . Alcohol use: No    Alcohol/week: 0.0 oz  . Drug use: No     Allergies   Insulin glargine and Statins   Review of Systems Review of Systems  Constitutional: Negative for fever.  Respiratory: Positive for shortness of breath. Negative for sputum production.   Cardiovascular: Negative for chest pain and leg swelling.  All other systems reviewed and are negative.    Physical Exam Updated Vital Signs BP (!) 143/60 (BP Location: Right Arm)   Pulse 100   Temp 98.2 F (36.8 C) (Oral)   Resp (!) 22   Ht 5\' 4"  (1.626 m)   Wt 81.6 kg (180 lb)   SpO2 93%   BMI 30.90 kg/m   Physical Exam  Constitutional: She is oriented to person, place, and time. She appears well-developed and  well-nourished. No distress.  HENT:  Head: Normocephalic and atraumatic.  Neck: Normal range of motion. Neck supple.  Cardiovascular: Normal rate and regular rhythm. Exam reveals no gallop and no friction rub.  No murmur heard. Pulmonary/Chest: Effort normal. No respiratory distress. She has no wheezes. She has rales.  There are slight rales in the bases bilaterally.  Abdominal: Soft. Bowel sounds are normal. She exhibits no distension. There is no tenderness.  Musculoskeletal: Normal range of motion.       Right lower leg: Normal. She exhibits edema. She exhibits no tenderness.       Left lower leg: Normal. She exhibits edema. She exhibits no tenderness.  There is trace edema of both lower extremities.  Neurological: She is alert and oriented to person, place, and time.  Skin: Skin is warm and dry. She is not diaphoretic.  Nursing note and vitals reviewed.    ED Treatments / Results  Labs (all labs ordered are listed, but only abnormal results are displayed) Labs Reviewed  COMPREHENSIVE METABOLIC PANEL  BRAIN NATRIURETIC PEPTIDE  CBC WITH DIFFERENTIAL/PLATELET  TROPONIN I    ED ECG REPORT   Date: 10/17/2017  Rate: 94  Rhythm: normal sinus rhythm with abnormal R-Wave transition  QRS Axis: normal  Intervals: normal  ST/T Wave abnormalities: nonspecific T wave changes  Conduction Disutrbances:none  Narrative Interpretation:   Old EKG Reviewed: unchanged  I have personally reviewed the EKG tracing and agree with the computerized printout as noted.   Radiology No results found.  Procedures Procedures (including critical care time)  Medications Ordered in ED Medications - No data to display   Initial Impression / Assessment and Plan / ED Course  I have reviewed the triage vital signs and the nursing notes.  Pertinent labs & imaging results that were available during my care of the patient were reviewed by me and considered in my medical decision making (see chart  for details).  Patient presenting with shortness of breath.  She is hypoxic on room air and becomes more hypoxic with ambulation with oxygen saturations into the low 80s.  Her work-up reveals an elevated BNP, crackles on exam, and chest x-ray showing an exacerbation of CHF.  She will be admitted for diuresis and further observation.  He does have an elevated troponin, the significance of which I am uncertain.  She does have chronic renal insufficiency.  CRITICAL CARE Performed by: Veryl Speak Total critical care time: 35 minutes Critical care time was exclusive of separately billable procedures and treating other patients. Critical care was necessary to treat or prevent imminent or life-threatening deterioration. Critical care was time spent personally by me on the following activities: development of treatment plan with patient and/or surrogate as well as nursing, discussions with consultants, evaluation of patient's response to treatment, examination of patient, obtaining history from patient or surrogate, ordering and performing treatments and interventions, ordering and review of laboratory studies, ordering and review of radiographic studies, pulse oximetry and re-evaluation of patient's condition.   Final Clinical Impressions(s) / ED Diagnoses   Final diagnoses:  None    ED Discharge Orders    None       Veryl Speak, MD 10/17/17 [phone]

## 2017-10-17 NOTE — H&P (Signed)
History and Physical    [name] DOB: [birth year] DOA: 10/17/2017  PCP: Caren Macadam, MD   Patient coming from: Home  Chief Complaint: Dyspnea  HPI: [name] is a 77 y.o. female with medical history significant for insulin-dependent diabetes, hypertension, dyslipidemia, CKD stage IV, prior NSTEMI due to demand ischemia, and normocytic anemia who presented to the emergency department for worsening dyspnea that began yesterday evening.  She noted decreased oxygen saturations in the 80th percentile on her home pulse oximetry monitor that was quite concerning as well.  This has been worsening over the last 2 days and is not improved or worsened with anything that she does in particular. She denies any chest pain, cough, fever, chills, lower extremity edema, palpitations, nausea, or vomiting.   ED Course: Vital signs are noted to be stable and hemoglobin is stable at 10.5.  BUN is 49 and creatinine 2.56 which is stable from prior.  BNP is 682 and troponin 0.49.  Two-view chest x-ray with findings of cardiomegaly suggestive of CHF with bilateral interstitial edema and pleural effusions left greater than right.  EKG with sinus rhythm at 94 bpm.  Patient given 1 dose of IV Lasix 40 mg with minimal diuresis noted thus far.  Review of Systems: All others reviewed and otherwise negative.  Past Medical History:  Diagnosis Date  . Anemia   . Arthritis   . Blood transfusion without reported diagnosis   . Cataract   . Chronic anemia   . Chronic combined systolic and diastolic CHF (congestive heart failure) ([place])    a. 2D echo 08/2016 at [facility]: EF 50-55% with inferior wall HK, impaired LV filling, fair study.  . CKD (chronic kidney disease), stage III ([place])   . Diabetes ([place])   . Gastritis   . GERD (gastroesophageal reflux disease)   . Gout   . HTN (hypertension)   . Hyperlipidemia   . Hypothyroidism   . Iron deficiency anemia 11/08/2016  . Normocytic anemia 10/26/2016    . NSTEMI (non-ST elevated myocardial infarction) ([place])    a. Complex admission 08/2016 - with severe hyperglycemia, AKI on CKD, severe anemia down to Hgb 6.8, acute combined CHF, troponin of 8.5, cath deferred due to renal dysfunction.  . Thyroid disease     Past Surgical History:  Procedure Laterality Date  . COLONOSCOPY WITH PROPOFOL N/A 10/08/2016   Procedure: COLONOSCOPY WITH PROPOFOL;  Surgeon: Ronnette Juniper, MD;  Location: [place];  Service: Gastroenterology;  Laterality: N/A;  . ENTEROSCOPY N/A 08/08/2017   Procedure: ENTEROSCOPY;  Surgeon: Daneil Dolin, MD;  [name];  Service: Endoscopy;  Laterality: N/A;  . ESOPHAGOGASTRODUODENOSCOPY N/A 10/06/2016   Procedure: ESOPHAGOGASTRODUODENOSCOPY (EGD);  Surgeon: Otis Brace, MD;  Location: [facility] ENDOSCOPY;  Service: Gastroenterology;  Laterality: N/A;  . ESOPHAGOGASTRODUODENOSCOPY (EGD) WITH PROPOFOL N/A 03/12/2017   Procedure: ESOPHAGOGASTRODUODENOSCOPY (EGD) WITH PROPOFOL;  Surgeon: Danie Binder, MD;  [name];  Service: Endoscopy;  Laterality: N/A;  . ESOPHAGOGASTRODUODENOSCOPY (EGD) WITH PROPOFOL N/A 08/08/2017   Procedure: ESOPHAGOGASTRODUODENOSCOPY (EGD) WITH PROPOFOL;  Surgeon: Daneil Dolin, MD;  [name];  Service: Endoscopy;  Laterality: N/A;  use pediatric colonoscope per Dr. Gala Romney  . GIVENS CAPSULE STUDY  10/08/2016   Procedure: GIVENS CAPSULE STUDY;  Surgeon: Ronnette Juniper, MD;  Location: [place];  Service: Gastroenterology;;  . Freda Munro CAPSULE STUDY N/A 10/29/2016   Procedure: GIVENS CAPSULE STUDY;  Surgeon: Danie Binder, MD;  [name];  Service: Endoscopy;  Laterality: N/A;  . ORIF HUMERUS FRACTURE Left 02/07/2015   Procedure: OPEN REDUCTION INTERNAL FIXATION (ORIF) PROXIMAL HUMERUS FRACTURE;  Surgeon: Marybelle Killings, MD;  Location: [place];  Service: Orthopedics;  Laterality: Left;  . THYROID SURGERY    . TOTAL HIP ARTHROPLASTY Left 02/07/2015   Procedure:  TOTAL HIP ARTHROPLASTY ANTERIOR APPROACH ;  Surgeon: Marybelle Killings, MD;  Location: [place];  Service: Orthopedics;  Laterality: Left;  . WRIST SURGERY Left      reports that she has never smoked. She has never used smokeless tobacco. She reports that she does not drink alcohol or use drugs.  Allergies  Allergen Reactions  . Insulin Glargine Swelling    "Makes me swell like a balloon all over", including face, but without any respiratory distress or rashes. Associated with weight gain.  . Statins Other (See Comments)    "I've tried them all; my muscle aches were so bad I couldn't walk".    Family History  Problem Relation Age of Onset  . Diabetes Mother   . Asthma Mother   . Early death Mother 72       Pneumonia  . Early death Father        Killed at rodeo  . CAD Neg Hx   . GI Bleed Neg Hx     Prior to Admission medications   Medication Sig Start Date End Date Taking? Authorizing Provider  acetaminophen (TYLENOL) 325 MG tablet Take 2 tablets (650 mg total) by mouth every 6 (six) hours as needed for mild pain or headache (or Fever >/= 101). 07/31/17   Barton Dubois, MD  amLODipine (NORVASC) 10 MG tablet Take 1 tablet (10 mg total) by mouth daily. 01/25/17   [name], MD  docusate sodium (COLACE) 100 MG capsule Take 1 capsule (100 mg total) by mouth every 12 (twelve) hours. Patient taking differently: Take 100 mg by mouth daily.  06/24/17   Long, Wonda Olds, MD  furosemide (LASIX) 20 MG tablet Take 20 mg by mouth as needed. Only has to take it when she gains 2 lbs 09/20/16   [provider]  gabapentin (NEURONTIN) 300 MG capsule Take 2 capsules (600 mg total) by mouth at bedtime. 06/04/17   [name], MD  hydrALAZINE (APRESOLINE) 25 MG tablet Take 1 tablet (25 mg total) by mouth every 8 (eight) hours as needed. 10/10/17   Caren Macadam, MD  Insulin Glargine (TOUJEO MAX SOLOSTAR) 300 UNIT/ML SOPN Inject 20 Units at bedtime into the skin. Patient taking differently:  Inject 24 Units into the skin at bedtime.  04/08/17   Cassandria Anger, MD  levothyroxine (SYNTHROID, LEVOTHROID) 50 MCG tablet TAKE 1 TABLET BY MOUTH ONCE DAILY BEFORE  BREAKFAST 10/10/17   Cassandria Anger, MD  metoprolol tartrate (LOPRESSOR) 25 MG tablet Take 0.5 tablets (12.5 mg total) by mouth 2 (two) times daily. 10/10/17   Caren Macadam, MD  Multiple Vitamin (MULTIVITAMIN WITH MINERALS) TABS tablet Take 1 tablet by mouth daily.    [provider]  ondansetron (ZOFRAN) 4 MG tablet Take 1 tablet (4 mg total) by mouth every 8 (eight) hours as needed for nausea or vomiting. 10/25/16   [name], MD  pantoprazole (PROTONIX) 40 MG tablet Take 1 tablet (40 mg total) by mouth 2 (two) times daily. 30 minutes before breakfast 07/31/17   Barton Dubois, MD    Physical Exam: Vitals:   10/17/17 0101 10/17/17 0130 10/17/17 0200 10/17/17 0230  BP:  (!) 145/65 132/69 (!) 152/66  Pulse:  99  97  Resp:   (!) 21 19  Temp:      TempSrc:      SpO2:  90% 95% 90%  Weight: 81.6 kg (180 lb)     Height: 5\' 4"  (1.626 m)       Constitutional: NAD, calm, comfortable Vitals:   10/17/17 0101 10/17/17 0130 10/17/17 0200 10/17/17 0230  BP:  (!) 145/65 132/69 (!) 152/66  Pulse:  99  97  Resp:   (!) 21 19  Temp:      TempSrc:      SpO2:  90% 95% 90%  Weight: 81.6 kg (180 lb)     Height: 5\' 4"  (1.626 m)      Eyes: lids and conjunctivae normal ENMT: Mucous membranes are moist.  Neck: normal, supple Respiratory: clear to auscultation bilaterally. Normal respiratory effort. No accessory muscle use. Currently on 2L [place]. Cardiovascular: Regular rate and rhythm, no murmurs. No extremity edema. Abdomen: no tenderness, no distention. Bowel sounds positive.  Musculoskeletal:  No joint deformity upper and lower extremities.   Skin: no rashes, lesions, ulcers.  Psychiatric: Normal judgment and insight. Alert and oriented x 3. Normal mood.   Labs on Admission: I have personally reviewed  following labs and imaging studies  CBC: Recent Labs  Lab 10/14/17 1033 10/17/17 0216  WBC 6.8 10.0  NEUTROABS 4.5 7.9*  HGB 10.8* 10.5*  HCT 33.5* 32.4*  MCV 90.5 91.3  PLT 292 694   Basic Metabolic Panel: Recent Labs  Lab 10/17/17 0216  NA 142  K 4.2  CL 108  CO2 24  GLUCOSE 119*  BUN 49*  CREATININE 2.56*  CALCIUM 8.9   GFR: Estimated Creatinine Clearance: 19 mL/min (A) (by C-G formula based on SCr of 2.56 mg/dL (H)). Liver Function Tests: Recent Labs  Lab 10/17/17 0216  AST 15  ALT 12*  ALKPHOS 61  BILITOT 0.8  PROT 6.5  ALBUMIN 3.5   No results for input(s): LIPASE, AMYLASE in the last 168 hours. No results for input(s): AMMONIA in the last 168 hours. Coagulation Profile: No results for input(s): INR, PROTIME in the last 168 hours. Cardiac Enzymes: Recent Labs  Lab 10/17/17 0216  TROPONINI 0.49*   BNP (last 3 results) No results for input(s): PROBNP in the last 8760 hours. HbA1C: No results for input(s): HGBA1C in the last 72 hours. CBG: No results for input(s): GLUCAP in the last 168 hours. Lipid Profile: No results for input(s): CHOL, HDL, LDLCALC, TRIG, CHOLHDL, LDLDIRECT in the last 72 hours. Thyroid Function Tests: No results for input(s): TSH, T4TOTAL, FREET4, T3FREE, THYROIDAB in the last 72 hours. Anemia Panel: No results for input(s): VITAMINB12, FOLATE, FERRITIN, TIBC, IRON, RETICCTPCT in the last 72 hours. Urine analysis:    Component Value Date/Time   COLORURINE YELLOW 03/09/2017 0801   APPEARANCEUR CLEAR 03/09/2017 0801   LABSPEC 1.013 03/09/2017 0801   PHURINE 5.0 03/09/2017 0801   GLUCOSEU 50 (A) 03/09/2017 0801   HGBUR NEGATIVE 03/09/2017 0801   BILIRUBINUR NEGATIVE 03/09/2017 0801   KETONESUR NEGATIVE 03/09/2017 0801   PROTEINUR 30 (A) 03/09/2017 0801   UROBILINOGEN 0.2 02/08/2015 1615   NITRITE NEGATIVE 03/09/2017 0801   LEUKOCYTESUR NEGATIVE 03/09/2017 0801    Radiological Exams on Admission: Dg Chest 2  View  Result Date: 10/17/2017 CLINICAL DATA:  Shortness of breath since yesterday. EXAM: CHEST - 2 VIEW COMPARISON:  03/10/2017 FINDINGS: Cardiomegaly appears similar. There is moderate interstitial edema.  Small bilateral pleural effusions, left greater than right. Associated bibasilar opacities, with increased retrocardiac opacity on the lateral view. No pneumothorax. Postsurgical change in the left proximal humerus. IMPRESSION: 1. CHF with pulmonary edema and small pleural effusions, left greater than right. Cardiomegaly is similar. 2. Bibasilar opacities, favor compressive atelectasis related to pleural effusion, superimposed pneumonia is also considered. Electronically Signed   By: Jeb Levering M.D.   On: 10/17/2017 02:20    EKG: Independently reviewed. SR at 94bpm.  Assessment/Plan Principal Problem:   Acute hypoxemic respiratory failure (HCC) Active Problems:   Diabetes mellitus with stage 4 chronic kidney disease (HCC)   HTN (hypertension)   History of non-ST elevation myocardial infarction (NSTEMI)   Hyperlipidemia associated with type 2 diabetes mellitus (HCC)   Elevated troponin   Hypothyroidism   Anemia    1. Acute hypoxemic respiratory failure secondary to pulmonary edema.  This is thought to be related to a CHF decompensation, but patient has no prior evidence on echo 02/2017 with noted LVEF 60 to 11% and no diastolic dysfunction.  Will repeat 2D echocardiogram and monitor daily weights as well as I's and O's with ongoing diuresis which will be continued with Lasix 40 mg IV twice daily. 2. Elevated troponin.  This is likely secondary to the above processes with demand ischemia.  Patient has been noted to have this in the past with significant elevations in the face of severe anemia.  Continue to trend troponins and consult cardiology for further recommendations.  Maintain on aspirin for now. 3. Insulin-dependent diabetes.  Continue home insulin regimen with sliding scale  insulin coverage and monitor. 4. CKD stage IV.  Continue to monitor renal panel in face of diuresis.  Currently per baseline. 5. Hypertension.  Continue amlodipine, metoprolol, and hydralazine. 6. Hypothyroidism.  Continue Synthroid. 7. GERD.  PPI. 8. Anemia.  Currently stable with no active bleeding noted.   DVT prophylaxis: Heparin  Code Status: Full Family Communication: Caregiver/friend at bedside Disposition Plan:Diuresis and troponin monitoring for ACS Consults called:Cardiology Admission status: Obs, tele   Khari Mally Darleen Crocker DO [facility] Pager [phone]  If 7PM-7AM, please contact night-coverage [email or web address] Password [email or web address]  10/17/2017, 4:07 AM

## 2017-10-17 NOTE — Progress Notes (Signed)
  Echocardiogram 2D Echocardiogram has been performed.  [name] 10/17/2017, 12:08 PM

## 2017-10-17 NOTE — ED Notes (Signed)
CRITICAL VALUE ALERT  Critical Value:  Troponin 0.49  Date & Time Notied:  10/17/17 0321  Provider Notified: Dr. Stark Jock  Orders Received/Actions taken:

## 2017-10-17 NOTE — ED Notes (Signed)
Pt ambulated to the restroom and back to her room. Pt's o2 dropped to 82%. EDP made aware.

## 2017-10-17 NOTE — ED Notes (Signed)
CRITICAL VALUE ALERT  Critical Value: troponin 0.53  Date & Time Notied:  10/17/2017 0545  Provider Notified: dr.[email or web address]  Orders Received/Actions taken: md notified

## 2017-10-18 DIAGNOSIS — J9601 Acute respiratory failure with hypoxia: Secondary | ICD-10-CM | POA: Diagnosis not present

## 2017-10-18 LAB — CBC
HCT: 31.5 % — ABNORMAL LOW (ref 36.0–46.0)
Hemoglobin: 10 g/dL — ABNORMAL LOW (ref 12.0–15.0)
MCH: 29.1 pg (ref 26.0–34.0)
MCHC: 31.7 g/dL (ref 30.0–36.0)
MCV: 91.6 fL (ref 78.0–100.0)
PLATELETS: 265 10*3/uL (ref 150–400)
RBC: 3.44 MIL/uL — ABNORMAL LOW (ref 3.87–5.11)
RDW: 14.7 % (ref 11.5–15.5)
WBC: 6.2 10*3/uL (ref 4.0–10.5)

## 2017-10-18 LAB — TROPONIN I: Troponin I: 0.74 ng/mL (ref <0.03)

## 2017-10-18 LAB — COMPREHENSIVE METABOLIC PANEL WITH GFR
Albumin: 3 g/dL — ABNORMAL LOW (ref 3.5–5.0)
Anion gap: 11 (ref 5–15)
CO2: 29 mmol/L (ref 22–32)
Glucose, Bld: 95 mg/dL (ref 65–99)
Potassium: 3.9 mmol/L (ref 3.5–5.1)
Sodium: 141 mmol/L (ref 135–145)

## 2017-10-18 LAB — GLUCOSE, CAPILLARY
Glucose-Capillary: 88 mg/dL (ref 65–99)
Glucose-Capillary: 155 mg/dL — ABNORMAL HIGH (ref 65–99)

## 2017-10-18 LAB — COMPREHENSIVE METABOLIC PANEL
ALT: 8 U/L — ABNORMAL LOW (ref 14–54)
AST: 13 U/L — ABNORMAL LOW (ref 15–41)
Alkaline Phosphatase: 49 U/L (ref 38–126)
BUN: 50 mg/dL — ABNORMAL HIGH (ref 6–20)
Calcium: 8.9 mg/dL (ref 8.9–10.3)
Chloride: 101 mmol/L (ref 101–111)
Creatinine, Ser: 2.77 mg/dL — ABNORMAL HIGH (ref 0.44–1.00)
GFR calc Af Amer: 18 mL/min — ABNORMAL LOW (ref >60)
GFR calc non Af Amer: 15 mL/min — ABNORMAL LOW (ref >60)
Total Bilirubin: 0.7 mg/dL (ref 0.3–1.2)
Total Protein: 6.4 g/dL — ABNORMAL LOW (ref 6.5–8.1)

## 2017-10-18 NOTE — Progress Notes (Signed)
Patient is to be discharged home and in stable condition. Patient's IV and telemetry removed, WNL. Patient given discharge instructions and verbalized understanding. Patient will be escorted out by staff via wheelchair upon arrival of transportation.  Celestia Khat, RN

## 2017-10-18 NOTE — Discharge Instructions (Signed)
Chronic Kidney Disease, Adult Chronic kidney disease (CKD) happens when the kidneys are damaged during a time of 3 or more months. The kidneys are two organs that do many important jobs in the body. These jobs include:  Removing wastes and extra fluids from the blood.  Making hormones that maintain the amount of fluid in your tissues and blood vessels.  Making sure that the body has the right amount of fluids and chemicals.  Most of the time, this condition does not go away, but it can usually be controlled. Steps must be taken to slow down the kidney damage or stop it from getting worse. Otherwise, the kidneys may stop working. Follow these instructions at home:  Follow your diet as told by your doctor. You may need to avoid alcohol, salty foods (sodium), and foods that are high in potassium, calcium, and protein.  Take over-the-counter and prescription medicines only as told by your doctor. Do not take any new medicines unless your doctor says you can do that. These include vitamins and minerals. ? Medicines and nutritional supplements can make kidney damage worse. ? Your doctor may need to change how much medicine you take.  Do not use any tobacco products. These include cigarettes, chewing tobacco, and e-cigarettes. If you need help quitting, ask your doctor.  Keep all follow-up visits as told by your doctor. This is important.  Check your blood pressure. Tell your doctor if there are changes to your blood pressure.  Get to a healthy weight. Stay at that weight. If you need help with this, ask your doctor.  Start or continue an exercise plan. Try to exercise at least 30 minutes a day, 5 days a week.  Stay up-to-date with your shots (immunizations) as told by your doctor. Contact a doctor if:  Your symptoms get worse.  You have new symptoms. Get help right away if:  You have symptoms of end-stage kidney disease. These include: ? Headaches. ? Skin that is darker or lighter  than normal. ? Numbness in your hands or feet. ? Easy bruising. ? Having hiccups often. ? Chest pain. ? Shortness of breath. ? Stopping of menstrual periods in women.  You have a fever.  You are making very little pee (urine).  You have pain or bleeding when you pee (urinate). This information is not intended to replace advice given to you by your health care provider. Make sure you discuss any questions you have with your health care provider. Document Released: 08/08/2009 Document Revised: 10/20/2015 Document Reviewed: 01/11/2012 Elsevier Interactive Patient Education  2017 Elsevier Inc.  

## 2017-10-18 NOTE — Care Management Obs Status (Signed)
[facility] NOTIFICATION   Patient Details  Name: SERENITI WAN MRN: [id number] Date of Birth: July 19, 1940   Medicare Observation Status Notification Given:  Yes    Tabitha Riggins, Chauncey Reading, RN 10/18/2017, 7:49 AM

## 2017-10-18 NOTE — Discharge Summary (Signed)
Physician Discharge Summary  [name] WVP:[id number] DOB: [birth year] DOA: 10/17/2017  PCP: Arnoldo Lenis, MD  Admit date: 10/17/2017 Discharge date: 10/18/2017  Admitted From: Home Disposition:  Home  Recommendations for Outpatient Follow-up:  1. Follow up with PCP in 1-2 weeks 2. Please obtain BMP/CBC in one week 3. Please weigh yourself daily and keep a log of your weight.  Please take an dose of your water pill if your weight is gone up by more than 3 to 4 pounds.  Home Health: None Equipment/Devices: None  Discharge Condition: Stable CODE STATUS: Full e) Diet recommendation: Carb restricted, renal diet  Brief/Interim Summary:  #) Acute hypoxic respiratory failure: Patient was admitted with acute hypoxic respiratory failure secondary to pulmonary edema.  This was felt to be largely secondary to her stage IV chronic kidney disease and type III cardiorenal syndrome.  Patient was given IV diuresis with furosemide 60 mg twice a day with resolution of hypoxia and improvement in dyspnea and symptoms.  She was told to weigh herself daily and take her oral diuretic at home if her weight went up more than 3 to 4 pounds.  #) Elevated troponin: Patient was noted to have elevated troponin that plateaued.  Her EKG was unremarkable.  She did not have any exertional chest pain.  Her echo that was done during this admission showed a normal ejection fraction with no wall motion abnormalities and diastolic dysfunction.  Patient had a similar presentation approximately 1 year ago with troponin elevation much higher and it was deemed due to her CKD that medical management should be done if any coronary artery disease was present.  No heparin was given during this hospitalization.  It is felt that at least some component of the troponin elevation was due to her stage IV chronic kidney disease.  #) Stage IV CKD: Patient was at baseline creatinine on admission and remained so.  Patient has  outpatient follow-up with nephrology.  #) Hypertension: Patient was continued on amlodipine 10 mg, hydralazine 25 mg 3 times daily, metoprolol tartrate 12.5 mg twice daily.  Her home furosemide was held on admission.  #) Type 2 diabetes on insulin: Patient was continued on glargine 3 ER per home dose and sliding scale insulin here.  #) Hypo-thyroidism: Patient was continued on levothyroxine 50 mcg daily.  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)   HTN (hypertension)   History of non-ST elevation myocardial infarction (NSTEMI)   Hyperlipidemia associated with type 2 diabetes mellitus (HCC)   Elevated troponin   Hypothyroidism   Anemia   Dyspnea    Discharge Instructions  Discharge Instructions    Call MD for:  difficulty breathing, headache or visual disturbances   Complete by:  As directed    Call MD for:  hives   Complete by:  As directed    Call MD for:  persistant dizziness or light-headedness   Complete by:  As directed    Call MD for:  persistant nausea and vomiting   Complete by:  As directed    Call MD for:  redness, tenderness, or signs of infection (pain, swelling, redness, odor or green/yellow discharge around incision site)   Complete by:  As directed    Call MD for:  severe uncontrolled pain   Complete by:  As directed  Call MD for:  temperature >100.4   Complete by:  As directed    Diet - low sodium heart healthy   Complete by:  As directed    Discharge instructions   Complete by:  As directed    Please follow-up with your primary care doctor in 1 to 2 weeks.  Please weigh yourself daily and please take an dose of your water pill if your weight is up by more than 3 to 4 pounds.  Please call your PCP that you have taken a dose of your water pill.   Increase  activity slowly   Complete by:  As directed      Allergies as of 10/18/2017      Reactions   Insulin Glargine Swelling   "Makes me swell like a balloon all over", including face, but without any respiratory distress or rashes. Associated with weight gain.   Statins Other (See Comments)   "I've tried them all; my muscle aches were so bad I couldn't walk".      Medication List    TAKE these medications   acetaminophen 325 MG tablet Commonly known as:  TYLENOL Take 2 tablets (650 mg total) by mouth every 6 (six) hours as needed for mild pain or headache (or Fever >/= 101).   amLODipine 10 MG tablet Commonly known as:  NORVASC Take 1 tablet (10 mg total) by mouth daily.   docusate sodium 100 MG capsule Commonly known as:  COLACE Take 1 capsule (100 mg total) by mouth every 12 (twelve) hours. What changed:  when to take this   furosemide 20 MG tablet Commonly known as:  LASIX Take 20 mg by mouth as needed. Only has to take it when she gains 2 lbs   gabapentin 300 MG capsule Commonly known as:  NEURONTIN Take 2 capsules (600 mg total) by mouth at bedtime.   hydrALAZINE 25 MG tablet Commonly known as:  APRESOLINE Take 1 tablet (25 mg total) by mouth every 8 (eight) hours as needed. What changed:  when to take this   Insulin Glargine 300 UNIT/ML Sopn Commonly known as:  TOUJEO MAX SOLOSTAR Inject 20 Units at bedtime into the skin. What changed:  how much to take   levothyroxine 50 MCG tablet Commonly known as:  SYNTHROID, LEVOTHROID TAKE 1 TABLET BY MOUTH ONCE DAILY BEFORE  BREAKFAST   metoprolol tartrate 25 MG tablet Commonly known as:  LOPRESSOR Take 0.5 tablets (12.5 mg total) by mouth 2 (two) times daily.   multivitamin with minerals Tabs tablet Take 1 tablet by mouth daily.   ondansetron 4 MG tablet Commonly known as:  ZOFRAN Take 1 tablet (4 mg total) by mouth every 8 (eight) hours as needed for nausea or vomiting.   pantoprazole 40 MG tablet Commonly known  as:  PROTONIX Take 1 tablet (40 mg total) by mouth 2 (two) times daily. 30 minutes before breakfast   ULORIC 40 MG tablet Generic drug:  febuxostat Take 1 tablet by mouth daily.       Allergies  Allergen Reactions  . Insulin Glargine Swelling    "Makes me swell like a balloon all over", including face, but without any respiratory distress or rashes. Associated with weight gain.  . Statins Other (See Comments)    "I've tried them all; my muscle aches were so bad I couldn't walk".    Consultations: None  Procedures/Studies: Dg Chest 2 View  Result Date: 10/17/2017 CLINICAL DATA:  Shortness of breath since yesterday. EXAM: CHEST -  2 VIEW COMPARISON:  03/10/2017 FINDINGS: Cardiomegaly appears similar. There is moderate interstitial edema. Small bilateral pleural effusions, left greater than right. Associated bibasilar opacities, with increased retrocardiac opacity on the lateral view. No pneumothorax. Postsurgical change in the left proximal humerus. IMPRESSION: 1. CHF with pulmonary edema and small pleural effusions, left greater than right. Cardiomegaly is similar. 2. Bibasilar opacities, favor compressive atelectasis related to pleural effusion, superimposed pneumonia is also considered. Electronically Signed   By: Jeb Levering M.D.   On: 10/17/2017 02:20    Echo 10/17/2017:- Left ventricle: The cavity size was normal. There was moderate   concentric hypertrophy. Systolic function was normal. The   estimated ejection fraction was in the range of 60% to 65%.   Images were inadequate for LV wall motion assessment. Features   are consistent with a pseudonormal left ventricular filling   pattern, with concomitant abnormal relaxation and increased   filling pressure (grade 2 diastolic dysfunction). Doppler   parameters are consistent with high ventricular filling pressure. - Aortic valve: Poorly visualized. Valve area (VTI): 2.38 cm^2.   Valve area (Vmax): 2.16 cm^2. Valve area  (Vmean): 2.26 cm^2. - Mitral valve: Calcified annulus. Moderate focal calcification of   the anterior leaflet (medial segment(s)). - Left atrium: The atrium was mildly dilated. - Atrial septum: There was increased thickness of the septum,   consistent with lipomatous hypertrophy. - Tricuspid valve: There was trivial regurgitation. - Pulmonary arteries: PA peak pressure: 38 mm Hg (S).    Subjective:   Discharge Exam: Vitals:   10/18/17 0710 10/18/17 1000  BP: (!) 119/56   Pulse:    Resp:    Temp:    SpO2:  93%   Vitals:   10/18/17 0457 10/18/17 0500 10/18/17 0710 10/18/17 1000  BP: (!) 108/56  (!) 119/56   Pulse: 70     Resp: 17     Temp: 98.4 F (36.9 C)     TempSrc: Oral     SpO2: 96%   93%  Weight:  80.5 kg (177 lb 7.5 oz)    Height:  5\' 4"  (1.626 m)     General exam: Appears calm and comfortable  Respiratory system:  No increased work of breathing, no crackles, rhonchi, rales, wheezes Cardiovascular system: Distant heart sounds, regular rate and rhythm, no murmurs Gastrointestinal system: Soft, nondistended, plus bowel sounds, no rebound or guarding Central nervous system: Alert and oriented. No focal neurological deficits. Extremities:  Trace lower extremity edema Skin: No rashes on visible skin Psychiatry: Judgement and insight appear normal. Mood & affect appropriate.     The results of significant diagnostics from this hospitalization (including imaging, microbiology, ancillary and laboratory) are listed below for reference.     Microbiology: Recent Results (from the past 240 hour(s))  MRSA PCR Screening     Status: None   Collection Time: 10/17/17  6:14 AM  Result Value Ref Range Status   MRSA by PCR NEGATIVE NEGATIVE Final    Comment:        The GeneXpert MRSA Assay (FDA approved for NASAL specimens only), is one component of a comprehensive MRSA colonization surveillance program. It is not intended to diagnose MRSA infection nor to guide  or monitor treatment for MRSA infections. Performed at [facility], [address], [place], [place] [address]      Labs: BNP (last 3 results) Recent Labs    03/08/17 2321 10/17/17 0216  BNP 1,202.0* 202.5*   Basic Metabolic Panel: Recent Labs  Lab 10/17/17  0216 10/17/17 0452 10/18/17 0518  NA 142  --  141  K 4.2  --  3.9  CL 108  --  101  CO2 24  --  29  GLUCOSE 119*  --  95  BUN 49*  --  50*  CREATININE 2.56* 2.52* 2.77*  CALCIUM 8.9  --  8.9   Liver Function Tests: Recent Labs  Lab 10/17/17 0216 10/18/17 0518  AST 15 13*  ALT 12* 8*  ALKPHOS 61 49  BILITOT 0.8 0.7  PROT 6.5 6.4*  ALBUMIN 3.5 3.0*   No results for input(s): LIPASE, AMYLASE in the last 168 hours. No results for input(s): AMMONIA in the last 168 hours. CBC: Recent Labs  Lab 10/14/17 1033 10/17/17 0216 10/17/17 0452 10/18/17 0518  WBC 6.8 10.0 9.4 6.2  NEUTROABS 4.5 7.9*  --   --   HGB 10.8* 10.5* 10.5* 10.0*  HCT 33.5* 32.4* 32.5* 31.5*  MCV 90.5 91.3 91.5 91.6  PLT 292 289 280 265   Cardiac Enzymes: Recent Labs  Lab 10/17/17 0216 10/17/17 0452 10/17/17 1024 10/17/17 1655 10/18/17 0518  TROPONINI 0.49* 0.53* 0.71* 0.73* 0.74*   BNP: Invalid input(s): POCBNP CBG: Recent Labs  Lab 10/17/17 0722 10/17/17 1131 10/17/17 1633 10/17/17 2024 10/18/17 0723  GLUCAP 110* 109* 123* 190* 88   D-Dimer No results for input(s): DDIMER in the last 72 hours. Hgb A1c No results for input(s): HGBA1C in the last 72 hours. Lipid Profile No results for input(s): CHOL, HDL, LDLCALC, TRIG, CHOLHDL, LDLDIRECT in the last 72 hours. Thyroid function studies No results for input(s): TSH, T4TOTAL, T3FREE, THYROIDAB in the last 72 hours.  Invalid input(s): FREET3 Anemia work up No results for input(s): VITAMINB12, FOLATE, FERRITIN, TIBC, IRON, RETICCTPCT in the last 72 hours. Urinalysis    Component Value Date/Time   COLORURINE YELLOW 03/09/2017 0801   APPEARANCEUR CLEAR 03/09/2017  0801   LABSPEC 1.013 03/09/2017 0801   PHURINE 5.0 03/09/2017 0801   GLUCOSEU 50 (A) 03/09/2017 0801   HGBUR NEGATIVE 03/09/2017 0801   BILIRUBINUR NEGATIVE 03/09/2017 0801   KETONESUR NEGATIVE 03/09/2017 0801   PROTEINUR 30 (A) 03/09/2017 0801   UROBILINOGEN 0.2 02/08/2015 1615   NITRITE NEGATIVE 03/09/2017 0801   LEUKOCYTESUR NEGATIVE 03/09/2017 0801   Sepsis Labs Invalid input(s): PROCALCITONIN,  WBC,  LACTICIDVEN Microbiology Recent Results (from the past 240 hour(s))  MRSA PCR Screening     Status: None   Collection Time: 10/17/17  6:14 AM  Result Value Ref Range Status   MRSA by PCR NEGATIVE NEGATIVE Final    Comment:        The GeneXpert MRSA Assay (FDA approved for NASAL specimens only), is one component of a comprehensive MRSA colonization surveillance program. It is not intended to diagnose MRSA infection nor to guide or monitor treatment for MRSA infections. Performed at [facility], [address] [place], [place] [address]      Time coordinating discharge: Over 30 minutes  SIGNED:   Cristy Folks, MD  [facility] 10/18/2017, 10:08 AM  If 7PM-7AM, please contact night-coverage [email or web address] Password [email or web address]

## 2017-10-22 ENCOUNTER — Telehealth: Payer: Self-pay

## 2017-10-22 ENCOUNTER — Ambulatory Visit (HOSPITAL_COMMUNITY): Payer: Medicare HMO

## 2017-10-22 NOTE — Telephone Encounter (Signed)
Transition Care Management Follow-up Telephone Call   Date discharged? 10/18/17               How have you been since you were released from the hospital? I've been feeling really good   Do you understand why you were in the hospital? Shortness of breath. O2 at home 88%, fluid around heart   Do you understand the discharge instructions? yes   Where were you discharged to? home   Items Reviewed:  Medications reviewed: yes  Allergies reviewed: yes  Dietary changes reviewed: yes carb restricted renal diet  Referrals reviewed: no new referrals   Functional Questionnaire:   Activities of Daily Living (ADLs): yes     Any transportation issues/concerns?: no    Any patient concerns? no   Confirmed importance and date/time of follow-up visits scheduled      Confirmed with patient if condition begins to worsen call PCP or go to the ER.  Patient was given the office number and encouraged to call back with question or concerns.  yes with verbal understanding.

## 2017-10-22 NOTE — Telephone Encounter (Signed)
When she is scheduled for TOC make sure that it is for 40 min visit. Gwen Her. Mannie Stabile, MD

## 2017-10-24 ENCOUNTER — Other Ambulatory Visit (HOSPITAL_COMMUNITY): Payer: Self-pay

## 2017-10-24 DIAGNOSIS — D500 Iron deficiency anemia secondary to blood loss (chronic): Secondary | ICD-10-CM

## 2017-10-24 DIAGNOSIS — N184 Chronic kidney disease, stage 4 (severe): Secondary | ICD-10-CM

## 2017-10-25 ENCOUNTER — Inpatient Hospital Stay (HOSPITAL_COMMUNITY): Payer: Medicare HMO

## 2017-10-25 ENCOUNTER — Inpatient Hospital Stay (HOSPITAL_COMMUNITY): Payer: Medicare HMO | Attending: Internal Medicine

## 2017-10-25 ENCOUNTER — Inpatient Hospital Stay (HOSPITAL_BASED_OUTPATIENT_CLINIC_OR_DEPARTMENT_OTHER): Payer: Medicare HMO | Admitting: Internal Medicine

## 2017-10-25 ENCOUNTER — Other Ambulatory Visit (HOSPITAL_COMMUNITY): Payer: Medicare HMO

## 2017-10-25 ENCOUNTER — Ambulatory Visit (HOSPITAL_COMMUNITY): Payer: Medicare HMO

## 2017-10-25 ENCOUNTER — Encounter (HOSPITAL_COMMUNITY): Payer: Self-pay | Admitting: Internal Medicine

## 2017-10-25 ENCOUNTER — Encounter (HOSPITAL_COMMUNITY): Payer: Self-pay

## 2017-10-25 VITALS — BP 141/54 | HR 82 | Temp 98.3°F | Resp 18 | Wt 177.2 lb

## 2017-10-25 DIAGNOSIS — D500 Iron deficiency anemia secondary to blood loss (chronic): Secondary | ICD-10-CM | POA: Insufficient documentation

## 2017-10-25 DIAGNOSIS — K922 Gastrointestinal hemorrhage, unspecified: Secondary | ICD-10-CM | POA: Diagnosis not present

## 2017-10-25 DIAGNOSIS — I130 Hypertensive heart and chronic kidney disease with heart failure and stage 1 through stage 4 chronic kidney disease, or unspecified chronic kidney disease: Secondary | ICD-10-CM | POA: Diagnosis not present

## 2017-10-25 DIAGNOSIS — N184 Chronic kidney disease, stage 4 (severe): Secondary | ICD-10-CM | POA: Diagnosis not present

## 2017-10-25 DIAGNOSIS — Z79899 Other long term (current) drug therapy: Secondary | ICD-10-CM

## 2017-10-25 LAB — COMPREHENSIVE METABOLIC PANEL
ALBUMIN: 3.5 g/dL (ref 3.5–5.0)
ALK PHOS: 66 U/L (ref 38–126)
ALT: 10 U/L — ABNORMAL LOW (ref 14–54)
AST: 15 U/L (ref 15–41)
Anion gap: 11 (ref 5–15)
BILIRUBIN TOTAL: 0.6 mg/dL (ref 0.3–1.2)
BUN: 53 mg/dL — AB (ref 6–20)
CALCIUM: 8.8 mg/dL — AB (ref 8.9–10.3)
CO2: 25 mmol/L (ref 22–32)
Chloride: 106 mmol/L (ref 101–111)
Creatinine, Ser: 2.5 mg/dL — ABNORMAL HIGH (ref 0.44–1.00)
GFR calc Af Amer: 20 mL/min — ABNORMAL LOW (ref >60)
GFR, EST NON AFRICAN AMERICAN: 17 mL/min — AB (ref >60)
GLUCOSE: 120 mg/dL — AB (ref 65–99)
Potassium: 4.2 mmol/L (ref 3.5–5.1)
Sodium: 142 mmol/L (ref 135–145)
TOTAL PROTEIN: 7 g/dL (ref 6.5–8.1)

## 2017-10-25 LAB — CBC WITH DIFFERENTIAL/PLATELET
BASOS ABS: 0.1 10*3/uL (ref 0.0–0.1)
BASOS PCT: 1 %
Eosinophils Absolute: 0.4 10*3/uL (ref 0.0–0.7)
Eosinophils Relative: 7 %
HEMATOCRIT: 32.8 % — AB (ref 36.0–46.0)
HEMOGLOBIN: 10.5 g/dL — AB (ref 12.0–15.0)
LYMPHS PCT: 20 %
Lymphs Abs: 1.2 10*3/uL (ref 0.7–4.0)
MCH: 29 pg (ref 26.0–34.0)
MCHC: 32 g/dL (ref 30.0–36.0)
MCV: 90.6 fL (ref 78.0–100.0)
MONO ABS: 0.4 10*3/uL (ref 0.1–1.0)
MONOS PCT: 7 %
NEUTROS ABS: 4 10*3/uL (ref 1.7–7.7)
NEUTROS PCT: 65 %
Platelets: 303 10*3/uL (ref 150–400)
RBC: 3.62 MIL/uL — ABNORMAL LOW (ref 3.87–5.11)
RDW: 14 % (ref 11.5–15.5)
WBC: 6.1 10*3/uL (ref 4.0–10.5)

## 2017-10-25 LAB — FERRITIN: Ferritin: 206 ng/mL (ref 11–307)

## 2017-10-25 LAB — LACTATE DEHYDROGENASE: LDH: 149 U/L (ref 98–192)

## 2017-10-25 MED ORDER — DARBEPOETIN ALFA 200 MCG/0.4ML IJ SOSY
200.0000 ug | PREFILLED_SYRINGE | Freq: Once | INTRAMUSCULAR | Status: AC
  Administered 2017-10-25: 200 ug via SUBCUTANEOUS
  Filled 2017-10-25: qty 0.4

## 2017-10-25 NOTE — Progress Notes (Signed)
Tony Friscia Boeke tolerated Aranesp injection well without complaints or incident. Hgb 10.5 today. VSS Pt discharged self ambulatory in satisfactory condition accompanied by family member

## 2017-10-25 NOTE — Patient Instructions (Signed)
[facility] Discharge Instructions  Received Aranesp injection today. Follow-up as scheduled. Call clinic for any questions or concerns   Thank you for choosing [facility] at [facility] to provide your oncology and hematology care.  To afford each patient quality time with our provider, please arrive at least 15 minutes before your scheduled appointment time.   If you have a lab appointment with the Cancer Center please come in thru the  Main Entrance and check in at the main information desk  You need to re-schedule your appointment should you arrive 10 or more minutes late.  We strive to give you quality time with our providers, and arriving late affects you and other patients whose appointments are after yours.  Also, if you no show three or more times for appointments you may be dismissed from the clinic at the providers discretion.     Again, thank you for choosing [facility].  Our hope is that these requests will decrease the amount of time that you wait before being seen by our physicians.       _____________________________________________________________  Should you have questions after your visit to [facility], please contact our office at [phone] between the hours of 8:30 a.m. and 4:30 p.m.  Voicemails left after 4:30 p.m. will not be returned until the following business day.  For prescription refill requests, have your pharmacy contact our office.       Resources For Cancer Patients and their Caregivers ? American Cancer Society: Can assist with transportation, wigs, general needs, runs Look Good Feel Better.        1-888-227-6333 ? Cancer Care: Provides financial assistance, online support groups, medication/co-pay assistance.  1-800-813-HOPE (4673) ? [facility] Assists [place] cancer patients and their families through emotional , educational and  financial support.  336-427-4357 ? [place] DSS Where to apply for food stamps, Medicaid and utility assistance. 336-342-1394 ? [facility]: Transportation to medical appointments. 336-347-2287 ? Social Security Administration: May apply for disability if have a Stage IV cancer. 336-342-7796 1-800-772-1213 ? [place] Aging, Disability and Transit Services: Assists with nutrition, care and transit needs. 336-349-2343  Cancer Center Support Programs:   > Cancer Support Group  2nd Tuesday of the month 1pm-2pm, Journey Room   > Creative Journey  3rd Tuesday of the month 1130am-1pm, Journey Room    

## 2017-10-25 NOTE — Patient Instructions (Signed)
[facility]  Discharge Instructions:  You were seen by dr. higgs today.  _______________________________________________________________  Thank you for choosing [facility] at [facility] to provide your oncology and hematology care.  To afford each patient quality time with our providers, please arrive at least 15 minutes before your scheduled appointment.  You need to re-schedule your appointment if you arrive 10 or more minutes late.  We strive to give you quality time with our providers, and arriving late affects you and other patients whose appointments are after yours.  Also, if you no show three or more times for appointments you may be dismissed from the clinic.  Again, thank you for choosing [facility] at [facility]. Our hope is that these requests will allow you access to exceptional care and in a timely manner. _______________________________________________________________  If you have questions after your visit, please contact our office at [phone] between the hours of 8:30 a.m. and 5:00 p.m. Voicemails left after 4:30 p.m. will not be returned until the following business day. _______________________________________________________________  For prescription refill requests, have your pharmacy contact our office. _______________________________________________________________  Recommendations made by the consultant and any test results will be sent to your referring physician. _______________________________________________________________ 

## 2017-10-25 NOTE — Progress Notes (Signed)
Diagnosis Iron deficiency anemia due to chronic blood loss - Plan: CBC with Differential/Platelet, Comprehensive metabolic panel, Lactate dehydrogenase, Ferritin, CBC with Differential/Platelet  Staging Cancer Staging No matching staging information was found for the patient.  Assessment and Plan:  1.  [name], [name] Anemia.  Pt was previously followed by Dr. Talbert Cage.  She has CKD and IDA and has been treated in the past with IV iron and Aranesp.  Labs done 10/25/2017 reviewed with pt show WBC 6.1 HB 10.5 and plts 303,000.  Pt will receive Aranesp tooday.  She will have monthly lab checks and possible aranesp.  She will  Hb today is 9.3.  She will receive Aranesp today.  She has recently been transfused.  She will continue to have labs monthly for evaluation for possible Aranesp.  She will be seen for follow-up with labs in 6 months.  Ferritin level pending and pt will be notified of results.    2.  CKD.  Cr stable at 2.5.  SPEP on RTC in 1 month.    3.  IDA.  She has been treated with IV iron in the past.  HB 10.5.  Ferritin pending.  She will be notified of results.    4.  GI bleed.  Continue to follow-up with GI as directed.    5.  Health maintenance.  Follow-up with PCP.  Pt should also discuss with them mammograms which she has not had done.    Interval History:  77 y.o. female with a medical history significant for chronic renal disease, Stage IV, DM, history of NSTEMI, HTN, hyperlipidemia.  Pt was followed by Dr. Talbert Cage for  normocytic, normochromic anemia with preservation of WBC and platelet count in the setting of chronic renal disease and POSITIVE stool cards.  She was admitted to [name] on 10/25/2016 discharged on 10/29/2016 when she was found at home unresponsive per patient's husband.  Patient reported that she was feeling fatigued over the last several days and complained of black stools.  The patient does take iron tablets.  In the emergency department, the patient was found to  have severe anemia with a hemoglobin of 3.8 g/dL. she received 4 units of packed red blood cells.  She also has a history of requiring 4 units of packed red blood cells on 10/04/2016 as well.  During her hospitalization, she was found to have fecal occult positive stool cards on 10/29/2016 2.  She is S/P colonoscopy by Dr. Ronnette Juniper on 10/08/2016 which revealed 1 5 mm polyp in the transverse colon and resection was not attempted due to anticoagulation.  Diverticulosis in sigmoid colon and nonbleeding internal hemorrhoids were noted as well.  Examination was otherwise negative.  She also went a camera capsule study on 10/08/2016 by the same position showing no mucosal abnormality within the small bowel.  She reports a history of melanoma.  She notes that this has improved and resolved.  She admits to a good energy level and denies any weakness.  She notes her stamina is good as well.  She walks at [name] frequently for exercise.  She interestingly admits to pica having consumed a crushed ice for years.  She reports [name] as the best ice.  Her anemia workup demonstrated a mild iron deficiency with a ferritin of 39. The rest of her anemia workup including a vitamin B12 level, folate level, SPEP/IFE, LDH were within normal limits. Patient received 2 doses of Injectafer on 11/15/16 and 12/03/16.  Current Status:  Pt is seen today for follow-up to go over labs.  She is here for possible Aranesp injection.    Problem List Patient Active Problem List   Diagnosis Date Noted  . Acute hypoxemic respiratory failure ([name]) [J96.01] 10/17/2017  . Dyspnea [R06.00] 10/17/2017  . Polyp of transverse colon [D12.3] 10/08/2017  . Anemia [D64.9] 08/07/2017  . GI bleed [K92.2] 07/30/2017  . Hyperkalemia [E87.5] 07/30/2017  . Essential hypertension, benign [I10] 12/13/2016  . Hypothyroidism [E03.9] 12/13/2016  . Iron deficiency anemia [D50.9] 11/08/2016  . Elevated troponin [R74.8]   . Aortic atherosclerosis  ([name]) [I70.0] 10/25/2016  . Symptomatic anemia [D64.9] 10/04/2016  . History of non-ST elevation myocardial infarction (NSTEMI) [I25.2] 10/04/2016  . Neuropathy [G62.9] 09/20/2016  . Chronic kidney disease, stage IV (severe) ([name]) [N18.4] 08/03/2015  . Hip fracture ([place]) [S72.009A] 02/05/2015  . Diabetes mellitus with stage 4 chronic kidney disease ([name]) [B71.69, N18.4] 02/05/2015  . HTN (hypertension) [I10] 02/05/2015  . Gout [M10.9] 02/05/2015  . Left humeral fracture [S42.302A] 02/05/2015  . Hyperlipidemia associated with type 2 diabetes mellitus ([name]) [E11.69, E78.5] 05/01/2013    Past Medical History Past Medical History:  Diagnosis Date  . Anemia   . Arthritis   . Blood transfusion without reported diagnosis   . Cataract   . Chronic anemia   . Chronic combined systolic and diastolic CHF (congestive heart failure) ([place])    a. 2D echo 08/2016 at [name]: EF 50-55% with inferior wall HK, impaired LV filling, fair study.  . CKD (chronic kidney disease), stage III ([name])   . Diabetes ([name])   . Gastritis   . GERD (gastroesophageal reflux disease)   . Gout   . HTN (hypertension)   . Hyperlipidemia   . Hypothyroidism   . Iron deficiency anemia 11/08/2016  . Normocytic anemia 10/26/2016  . NSTEMI (non-ST elevated myocardial infarction) ([name])    a. Complex admission 08/2016 - with severe hyperglycemia, AKI on CKD, severe anemia down to Hgb 6.8, acute combined CHF, troponin of 8.5, cath deferred due to renal dysfunction.  . Thyroid disease     Past Surgical History Past Surgical History:  Procedure Laterality Date  . COLONOSCOPY WITH PROPOFOL N/A 10/08/2016   Procedure: COLONOSCOPY WITH PROPOFOL;  Surgeon: Ronnette Juniper, MD;  Location: [name];  Service: Gastroenterology;  Laterality: N/A;  . ENTEROSCOPY N/A 08/08/2017   Procedure: ENTEROSCOPY;  Surgeon: Daneil Dolin, MD;  [name];  Service: Endoscopy;  Laterality: N/A;  . ESOPHAGOGASTRODUODENOSCOPY N/A 10/06/2016    Procedure: ESOPHAGOGASTRODUODENOSCOPY (EGD);  Surgeon: Otis Brace, MD;  Location[facility] ENDOSCOPY;  Service: Gastroenterology;  Laterality: N/A;  . ESOPHAGOGASTRODUODENOSCOPY (EGD) WITH PROPOFOL N/A 03/12/2017   Procedure: ESOPHAGOGASTRODUODENOSCOPY (EGD) WITH PROPOFOL;  Surgeon: Danie Binder, MD;  [name];  Service: Endoscopy;  Laterality: N/A;  . ESOPHAGOGASTRODUODENOSCOPY (EGD) WITH PROPOFOL N/A 08/08/2017   Procedure: ESOPHAGOGASTRODUODENOSCOPY (EGD) WITH PROPOFOL;  Surgeon: Daneil Dolin, MD;  [name];  Service: Endoscopy;  Laterality: N/A;  use pediatric colonoscope per Dr. Gala Romney  . GIVENS CAPSULE STUDY  10/08/2016   Procedure: GIVENS CAPSULE STUDY;  Surgeon: Ronnette Juniper, MD;  Location: [place];  Service: Gastroenterology;;  . Freda Munro CAPSULE STUDY N/A 10/29/2016   Procedure: GIVENS CAPSULE STUDY;  Surgeon: Danie Binder, MD;  [name];  Service: Endoscopy;  Laterality: N/A;  . ORIF HUMERUS FRACTURE Left 02/07/2015   Procedure: OPEN REDUCTION INTERNAL FIXATION (ORIF) PROXIMAL HUMERUS FRACTURE;  Surgeon: Marybelle Killings, MD;  Location: [place];  Service: Orthopedics;  Laterality: Left;  . THYROID SURGERY    . TOTAL HIP ARTHROPLASTY Left 02/07/2015   Procedure: TOTAL HIP ARTHROPLASTY ANTERIOR APPROACH ;  Surgeon: Marybelle Killings, MD;  Location: [place];  Service: Orthopedics;  Laterality: Left;  . WRIST SURGERY Left     Family History Family History  Problem Relation Age of Onset  . Diabetes Mother   . Asthma Mother   . Early death Mother 41       Pneumonia  . Early death Father        Killed at rodeo  . CAD Neg Hx   . GI Bleed Neg Hx      Social History  reports that she has never smoked. She has never used smokeless tobacco. She reports that she does not drink alcohol or use drugs.  Medications  Current Outpatient Medications:  .  acetaminophen (TYLENOL) 325 MG tablet, Take 2 tablets (650 mg total) by mouth every 6 (six) hours as  needed for mild pain or headache (or Fever >/= 101)., Disp: 40 tablet, Rfl: 0 .  amLODipine (NORVASC) 10 MG tablet, Take 1 tablet (10 mg total) by mouth daily., Disp: 90 tablet, Rfl: 3 .  docusate sodium (COLACE) 100 MG capsule, Take 1 capsule (100 mg total) by mouth every 12 (twelve) hours. (Patient taking differently: Take 100 mg by mouth daily. ), Disp: 60 capsule, Rfl: 0 .  furosemide (LASIX) 20 MG tablet, Take 20 mg by mouth as needed. Only has to take it when she gains 2 lbs, Disp: , Rfl:  .  gabapentin (NEURONTIN) 300 MG capsule, Take 2 capsules (600 mg total) by mouth at bedtime., Disp: 180 capsule, Rfl: 1 .  hydrALAZINE (APRESOLINE) 25 MG tablet, Take 1 tablet (25 mg total) by mouth every 8 (eight) hours as needed. (Patient taking differently: Take 25 mg by mouth every morning. ), Disp: 90 tablet, Rfl: 0 .  Insulin Glargine (TOUJEO MAX SOLOSTAR) 300 UNIT/ML SOPN, Inject 20 Units at bedtime into the skin. (Patient taking differently: Inject 24 Units into the skin at bedtime. ), Disp: 3 pen, Rfl: 2 .  levothyroxine (SYNTHROID, LEVOTHROID) 50 MCG tablet, TAKE 1 TABLET BY MOUTH ONCE DAILY BEFORE  BREAKFAST, Disp: 30 tablet, Rfl: 3 .  metoprolol tartrate (LOPRESSOR) 25 MG tablet, Take 0.5 tablets (12.5 mg total) by mouth 2 (two) times daily., Disp: 30 tablet, Rfl: 1 .  Multiple Vitamin (MULTIVITAMIN WITH MINERALS) TABS tablet, Take 1 tablet by mouth daily., Disp: , Rfl:  .  ondansetron (ZOFRAN) 4 MG tablet, Take 1 tablet (4 mg total) by mouth every 8 (eight) hours as needed for nausea or vomiting., Disp: 20 tablet, Rfl: 1 .  pantoprazole (PROTONIX) 40 MG tablet, Take 1 tablet (40 mg total) by mouth 2 (two) times daily. 30 minutes before breakfast, Disp: 60 tablet, Rfl: 1 .  ULORIC 40 MG tablet, Take 1 tablet by mouth daily., Disp: , Rfl: 3 No current facility-administered medications for this visit.   Facility-Administered Medications Ordered in Other Visits:  .  Darbepoetin Alfa (ARANESP)  injection 200 mcg, 200 mcg, Subcutaneous, Once, Joane Postel, MD  Allergies Insulin glargine and Statins  Review of Systems Review of Systems - Oncology ROS as per HPI otherwise 12 point ROS is negative.   Physical Exam  Vitals Wt Readings from Last 3 Encounters:  10/25/17 177 lb 3.2 oz (80.4 kg)  10/18/17 177 lb 7.5 oz (80.5 kg)  10/08/17 180 lb 9.6 oz (  81.9 kg)   Temp Readings from Last 3 Encounters:  10/25/17 98.3 F (36.8 C) (Oral)  10/18/17 98.4 F (36.9 C) (Oral)  10/08/17 (!) 97.1 F (36.2 C) (Oral)   BP Readings from Last 3 Encounters:  10/25/17 (!) 141/54  10/18/17 (!) 119/56  10/08/17 130/69   Pulse Readings from Last 3 Encounters:  10/25/17 82  10/18/17 70  10/08/17 80    Constitutional: Well-developed, well-nourished, and in no distress.   HENT: Head: Normocephalic and atraumatic.  Mouth/Throat: No oropharyngeal exudate. Mucosa moist. Eyes: Pupils are equal, round, and reactive to light. Conjunctivae are normal. No scleral icterus.  Neck: Normal range of motion. Neck supple. No JVD present.  Cardiovascular: Normal rate, regular rhythm and normal heart sounds.  Exam reveals no gallop and no friction rub.   No murmur heard. Pulmonary/Chest: Effort normal and breath sounds normal. No respiratory distress. No wheezes.No rales.  Abdominal: Soft. Bowel sounds are normal. No distension. There is no tenderness. There is no guarding.  Musculoskeletal: No edema or tenderness.  Lymphadenopathy:No cervical, axillary or supraclavicular adenopathy.  Neurological: Alert and oriented to person, place, and time. No cranial nerve deficit.  Skin: Skin is warm and dry. No rash noted. No erythema. No pallor.  Psychiatric: Affect and judgment normal.   Labs Appointment on 10/25/2017  Component Date Value Ref Range Status  . WBC 10/25/2017 6.1  4.0 - 10.5 K/uL Final  . RBC 10/25/2017 3.62* 3.87 - 5.11 MIL/uL Final  . Hemoglobin 10/25/2017 10.5* 12.0 - 15.0 g/dL Final  .  HCT 10/25/2017 32.8* 36.0 - 46.0 % Final  . MCV 10/25/2017 90.6  78.0 - 100.0 fL Final  . MCH 10/25/2017 29.0  26.0 - 34.0 pg Final  . MCHC 10/25/2017 32.0  30.0 - 36.0 g/dL Final  . RDW 10/25/2017 14.0  11.5 - 15.5 % Final  . Platelets 10/25/2017 303  150 - 400 K/uL Final  . Neutrophils Relative % 10/25/2017 65  % Final  . Neutro Abs 10/25/2017 4.0  1.7 - 7.7 K/uL Final  . Lymphocytes Relative 10/25/2017 20  % Final  . Lymphs Abs 10/25/2017 1.2  0.7 - 4.0 K/uL Final  . Monocytes Relative 10/25/2017 7  % Final  . Monocytes Absolute 10/25/2017 0.4  0.1 - 1.0 K/uL Final  . Eosinophils Relative 10/25/2017 7  % Final  . Eosinophils Absolute 10/25/2017 0.4  0.0 - 0.7 K/uL Final  . Basophils Relative 10/25/2017 1  % Final  . Basophils Absolute 10/25/2017 0.1  0.0 - 0.1 K/uL Final   Performed at [facility], [address], [place], [place] [address]  . Sodium 10/25/2017 142  135 - 145 mmol/L Final  . Potassium 10/25/2017 4.2  3.5 - 5.1 mmol/L Final  . Chloride 10/25/2017 106  101 - 111 mmol/L Final  . CO2 10/25/2017 25  22 - 32 mmol/L Final  . Glucose, Bld 10/25/2017 120* 65 - 99 mg/dL Final  . BUN 10/25/2017 53* 6 - 20 mg/dL Final  . Creatinine, Ser 10/25/2017 2.50* 0.44 - 1.00 mg/dL Final  . Calcium 10/25/2017 8.8* 8.9 - 10.3 mg/dL Final  . Total Protein 10/25/2017 7.0  6.5 - 8.1 g/dL Final  . Albumin 10/25/2017 3.5  3.5 - 5.0 g/dL Final  . AST 10/25/2017 15  15 - 41 U/L Final  . ALT 10/25/2017 10* 14 - 54 U/L Final  . Alkaline Phosphatase 10/25/2017 66  38 - 126 U/L Final  . Total Bilirubin 10/25/2017 0.6  0.3 - 1.2  mg/dL Final  . GFR calc non Af Amer 10/25/2017 17* >60 mL/min Final  . GFR calc Af Amer 10/25/2017 20* >60 mL/min Final   Comment: (NOTE) The eGFR has been calculated using the CKD EPI equation. This calculation has not been validated in all clinical situations. eGFR's persistently <60 mL/min signify possible Chronic Kidney Disease.   Georgiann Hahn gap 10/25/2017 11  5 - 15  Final   Performed at [facility], [address], [place], [place] [address]  . LDH 10/25/2017 149  98 - 192 U/L Final   Performed at [facility], [address], [place], [place] [address]     Pathology Orders Placed This Encounter  Procedures  . CBC with Differential/Platelet    Standing Status:   Future    Standing Expiration Date:   10/26/2018  . Comprehensive metabolic panel    Standing Status:   Future    Standing Expiration Date:   10/26/2018  . Lactate dehydrogenase    Standing Status:   Future    Standing Expiration Date:   10/26/2018  . Ferritin    Standing Status:   Future    Standing Expiration Date:   10/26/2018  . CBC with Differential/Platelet    Standing Status:   Standing    Number of Occurrences:   12    Standing Expiration Date:   10/26/2018       Zoila Shutter MD

## 2017-10-29 ENCOUNTER — Other Ambulatory Visit (HOSPITAL_COMMUNITY): Payer: Medicare HMO

## 2017-10-29 ENCOUNTER — Ambulatory Visit (HOSPITAL_COMMUNITY): Payer: Medicare HMO

## 2017-10-29 ENCOUNTER — Ambulatory Visit (HOSPITAL_COMMUNITY): Payer: Medicare HMO | Admitting: Internal Medicine

## 2017-11-06 ENCOUNTER — Other Ambulatory Visit (HOSPITAL_COMMUNITY)
Admission: RE | Admit: 2017-11-06 | Discharge: 2017-11-06 | Disposition: A | Discharge status: Home / Self Care | Payer: Medicare HMO | Source: Ambulatory Visit | Attending: "Endocrinology | Admitting: "Endocrinology

## 2017-11-06 DIAGNOSIS — E1122 Type 2 diabetes mellitus with diabetic chronic kidney disease: Secondary | ICD-10-CM | POA: Diagnosis not present

## 2017-11-06 LAB — COMPREHENSIVE METABOLIC PANEL
ALBUMIN: 3.7 g/dL (ref 3.5–5.0)
ALT: 13 U/L — ABNORMAL LOW (ref 14–54)
ANION GAP: 11 (ref 5–15)
AST: 24 U/L (ref 15–41)
Alkaline Phosphatase: 64 U/L (ref 38–126)
BUN: 44 mg/dL — AB (ref 6–20)
CO2: 26 mmol/L (ref 22–32)
Calcium: 9.2 mg/dL (ref 8.9–10.3)
Chloride: 105 mmol/L (ref 101–111)
Creatinine, Ser: 2.48 mg/dL — ABNORMAL HIGH (ref 0.44–1.00)
GFR calc Af Amer: 20 mL/min — ABNORMAL LOW (ref >60)
GFR calc non Af Amer: 18 mL/min — ABNORMAL LOW (ref >60)
GLUCOSE: 112 mg/dL — AB (ref 65–99)
POTASSIUM: 4.4 mmol/L (ref 3.5–5.1)
SODIUM: 142 mmol/L (ref 135–145)
Total Bilirubin: 1 mg/dL (ref 0.3–1.2)
Total Protein: 7.1 g/dL (ref 6.5–8.1)

## 2017-11-06 LAB — HEMOGLOBIN A1C
Hgb A1c MFr Bld: 6.1 % — ABNORMAL HIGH (ref 4.8–5.6)
Mean Plasma Glucose: 128.37 mg/dL

## 2017-11-06 NOTE — Progress Notes (Signed)
Diagnosis Symptomatic anemia - Plan: CBC with Differential/Platelet, Comprehensive metabolic panel, Lactate dehydrogenase, Protein electrophoresis, serum, Ferritin, CBC with Differential/Platelet, Comprehensive metabolic panel, Lactate dehydrogenase, Ferritin  Iron deficiency anemia due to chronic blood loss - Plan: SCHEDULING COMMUNICATION LAB, SCHEDULING COMMUNICATION INJECTION, ARANESP TREATMENT CONDITION, Darbepoetin Alfa (ARANESP) injection 200 mcg, DISCONTINUED: Darbepoetin Alfa (ARANESP) injection 200 mcg  Chronic kidney disease, stage IV (severe) (HCC) - Plan: SCHEDULING COMMUNICATION LAB, SCHEDULING COMMUNICATION INJECTION, ARANESP TREATMENT CONDITION, Darbepoetin Alfa (ARANESP) injection 200 mcg, DISCONTINUED: Darbepoetin Alfa (ARANESP) injection 200 mcg  Staging Cancer Staging No matching staging information was found for the patient.  Assessment and Plan:  1.  [name], [place] Anemia.  Pt was previously followed by Dr. Talbert Cage.  She has CKD and IDA and has been treated in the past with IV iron and Aranesp.  Hb today is 10.5.  She will receive Aranesp today.   She will continue to have labs monthly for evaluation for possible Aranesp.  She will be seen for follow-up in 10/2017.    2.  CKD.  Cr 2.25.  SPEP is negative.  Follow-up with nephrology as directed.    3.  IDA.  She was last treated with IV iron 03/2017.  Ferritin 204.  Will repeat iron levels in 10/2017.    4.  GI bleed.  Continue to follow-up with GI as directed.    Interval History:  77 y.o. female with a medical history significant for chronic renal disease, Stage IV, DM, history of NSTEMI, HTN, hyperlipidemia.  Pt was followed by Dr. Talbert Cage for  normocytic, normochromic anemia with preservation of WBC and platelet count in the setting of chronic renal disease and POSITIVE stool cards.  She was admitted to [facility] on 10/25/2016 discharged on 10/29/2016 when she was found at home unresponsive per patient's husband.  Patient  reported that she was feeling fatigued over the last several days and complained of black stools.  The patient does take iron tablets.  In the emergency department, the patient was found to have severe anemia with a hemoglobin of 3.8 g/dL. she received 4 units of packed red blood cells.  She also has a history of requiring 4 units of packed red blood cells on 10/04/2016 as well.  During her hospitalization, she was found to have fecal occult positive stool cards on 10/29/2016 2.  She is S/P colonoscopy by Dr. Ronnette Juniper on 10/08/2016 which revealed 1 5 mm polyp in the transverse colon and resection was not attempted due to anticoagulation.  Diverticulosis in sigmoid colon and nonbleeding internal hemorrhoids were noted as well.  Examination was otherwise negative.  She also went a camera capsule study on 10/08/2016 by the same position showing no mucosal abnormality within the small bowel.  Her anemia workup demonstrated a mild iron deficiency with a ferritin of 39. The rest of her anemia workup including a vitamin B12 level, folate level, SPEP/IFE, LDH were within normal limits. Patient was last treated with IV iron in 2018.    Current Status:   Pt is seen today for follow-up to go over labs.  She is here for evaluation prior to  Aranesp injection.    Problem List Patient Active Problem List   Diagnosis Date Noted  . Acute hypoxemic respiratory failure ([place]) [J96.01] 10/17/2017  . Dyspnea [R06.00] 10/17/2017  . Polyp of transverse colon [D12.3] 10/08/2017  . Anemia [D64.9] 08/07/2017  . GI bleed [K92.2] 07/30/2017  . Hyperkalemia [E87.5] 07/30/2017  . Essential hypertension, benign [I10] 12/13/2016  .  Hypothyroidism [E03.9] 12/13/2016  . Iron deficiency anemia [D50.9] 11/08/2016  . Elevated troponin [R74.8]   . Aortic atherosclerosis ([place]) [I70.0] 10/25/2016  . Symptomatic anemia [D64.9] 10/04/2016  . History of non-ST elevation myocardial infarction (NSTEMI) [I25.2] 10/04/2016  . Neuropathy  [G62.9] 09/20/2016  . Chronic kidney disease, stage IV (severe) ([place]) [N18.4] 08/03/2015  . Hip fracture ([place]) [S72.009A] 02/05/2015  . Diabetes mellitus with stage 4 chronic kidney disease ([place]) [S82.70, N18.4] 02/05/2015  . HTN (hypertension) [I10] 02/05/2015  . Gout [M10.9] 02/05/2015  . Left humeral fracture [S42.302A] 02/05/2015  . Hyperlipidemia associated with type 2 diabetes mellitus ([place]) [E11.69, E78.5] 05/01/2013    Past Medical History Past Medical History:  Diagnosis Date  . Anemia   . Arthritis   . Blood transfusion without reported diagnosis   . Cataract   . Chronic anemia   . Chronic combined systolic and diastolic CHF (congestive heart failure) ([place])    a. 2D echo 08/2016 at [facility]: EF 50-55% with inferior wall HK, impaired LV filling, fair study.  . CKD (chronic kidney disease), stage III ([place])   . Diabetes ([place])   . Gastritis   . GERD (gastroesophageal reflux disease)   . Gout   . HTN (hypertension)   . Hyperlipidemia   . Hypothyroidism   . Iron deficiency anemia 11/08/2016  . Normocytic anemia 10/26/2016  . NSTEMI (non-ST elevated myocardial infarction) ([place])    a. Complex admission 08/2016 - with severe hyperglycemia, AKI on CKD, severe anemia down to Hgb 6.8, acute combined CHF, troponin of 8.5, cath deferred due to renal dysfunction.  . Thyroid disease     Past Surgical History Past Surgical History:  Procedure Laterality Date  . COLONOSCOPY WITH PROPOFOL N/A 10/08/2016   Procedure: COLONOSCOPY WITH PROPOFOL;  Surgeon: Ronnette Juniper, MD;  Location: [place];  Service: Gastroenterology;  Laterality: N/A;  . ENTEROSCOPY N/A 08/08/2017   Procedure: ENTEROSCOPY;  Surgeon: Daneil Dolin, MD;  [name];  Service: Endoscopy;  Laterality: N/A;  . ESOPHAGOGASTRODUODENOSCOPY N/A 10/06/2016   Procedure: ESOPHAGOGASTRODUODENOSCOPY (EGD);  Surgeon: Otis Brace, MD;  Location: [facility] ENDOSCOPY;  Service: Gastroenterology;  Laterality: N/A;  .  ESOPHAGOGASTRODUODENOSCOPY (EGD) WITH PROPOFOL N/A 03/12/2017   Procedure: ESOPHAGOGASTRODUODENOSCOPY (EGD) WITH PROPOFOL;  Surgeon: Danie Binder, MD;  [name];  Service: Endoscopy;  Laterality: N/A;  . ESOPHAGOGASTRODUODENOSCOPY (EGD) WITH PROPOFOL N/A 08/08/2017   Procedure: ESOPHAGOGASTRODUODENOSCOPY (EGD) WITH PROPOFOL;  Surgeon: Daneil Dolin, MD;  [name];  Service: Endoscopy;  Laterality: N/A;  use pediatric colonoscope per Dr. Gala Romney  . GIVENS CAPSULE STUDY  10/08/2016   Procedure: GIVENS CAPSULE STUDY;  Surgeon: Ronnette Juniper, MD;  Location: [place];  Service: Gastroenterology;;  . Freda Munro CAPSULE STUDY N/A 10/29/2016   Procedure: GIVENS CAPSULE STUDY;  Surgeon: Danie Binder, MD;  [name];  Service: Endoscopy;  Laterality: N/A;  . ORIF HUMERUS FRACTURE Left 02/07/2015   Procedure: OPEN REDUCTION INTERNAL FIXATION (ORIF) PROXIMAL HUMERUS FRACTURE;  Surgeon: Marybelle Killings, MD;  Location: [place];  Service: Orthopedics;  Laterality: Left;  . THYROID SURGERY    . TOTAL HIP ARTHROPLASTY Left 02/07/2015   Procedure: TOTAL HIP ARTHROPLASTY ANTERIOR APPROACH ;  Surgeon: Marybelle Killings, MD;  Location: [place];  Service: Orthopedics;  Laterality: Left;  . WRIST SURGERY Left     Family History Family History  Problem Relation Age of Onset  . Diabetes Mother   . Asthma Mother   . Early  death Mother 52       Pneumonia  . Early death Father        Killed at rodeo  . CAD Neg Hx   . GI Bleed Neg Hx      Social History  reports that she has never smoked. She has never used smokeless tobacco. She reports that she does not drink alcohol or use drugs.  Medications  Current Outpatient Medications:  .  acetaminophen (TYLENOL) 325 MG tablet, Take 2 tablets (650 mg total) by mouth every 6 (six) hours as needed for mild pain or headache (or Fever >/= 101)., Disp: 40 tablet, Rfl: 0 .  amLODipine (NORVASC) 10 MG tablet, Take 1 tablet (10 mg total) by  mouth daily., Disp: 90 tablet, Rfl: 3 .  docusate sodium (COLACE) 100 MG capsule, Take 1 capsule (100 mg total) by mouth every 12 (twelve) hours. (Patient taking differently: Take 100 mg by mouth daily. ), Disp: 60 capsule, Rfl: 0 .  furosemide (LASIX) 20 MG tablet, Take 20 mg by mouth as needed. Only has to take it when she gains 2 lbs, Disp: , Rfl:  .  gabapentin (NEURONTIN) 300 MG capsule, Take 2 capsules (600 mg total) by mouth at bedtime., Disp: 180 capsule, Rfl: 1 .  Insulin Glargine (TOUJEO MAX SOLOSTAR) 300 UNIT/ML SOPN, Inject 20 Units at bedtime into the skin. (Patient taking differently: Inject 24 Units into the skin at bedtime. ), Disp: 3 pen, Rfl: 2 .  Multiple Vitamin (MULTIVITAMIN WITH MINERALS) TABS tablet, Take 1 tablet by mouth daily., Disp: , Rfl:  .  ondansetron (ZOFRAN) 4 MG tablet, Take 1 tablet (4 mg total) by mouth every 8 (eight) hours as needed for nausea or vomiting., Disp: 20 tablet, Rfl: 1 .  pantoprazole (PROTONIX) 40 MG tablet, Take 1 tablet (40 mg total) by mouth 2 (two) times daily. 30 minutes before breakfast, Disp: 60 tablet, Rfl: 1 .  hydrALAZINE (APRESOLINE) 25 MG tablet, Take 1 tablet (25 mg total) by mouth every 8 (eight) hours as needed. (Patient taking differently: Take 25 mg by mouth every morning. ), Disp: 90 tablet, Rfl: 0 .  levothyroxine (SYNTHROID, LEVOTHROID) 50 MCG tablet, TAKE 1 TABLET BY MOUTH ONCE DAILY BEFORE  BREAKFAST, Disp: 30 tablet, Rfl: 3 .  metoprolol tartrate (LOPRESSOR) 25 MG tablet, Take 0.5 tablets (12.5 mg total) by mouth 2 (two) times daily., Disp: 30 tablet, Rfl: 1 .  ULORIC 40 MG tablet, Take 1 tablet by mouth daily., Disp: , Rfl: 3 No current facility-administered medications for this visit.   Facility-Administered Medications Ordered in Other Visits:  .  Darbepoetin Alfa (ARANESP) injection 200 mcg, 200 mcg, Subcutaneous, Once, Idalie Canto, MD  Allergies Insulin glargine and Statins  Review of Systems Review of Systems -  Oncology ROS as per HPI otherwise 12 point ROS is negative.   Physical Exam  Vitals Wt Readings from Last 3 Encounters:  10/25/17 177 lb 3.2 oz (80.4 kg)  10/18/17 177 lb 7.5 oz (80.5 kg)  10/08/17 180 lb 9.6 oz (81.9 kg)   Temp Readings from Last 3 Encounters:  10/25/17 98.3 F (36.8 C) (Oral)  10/18/17 98.4 F (36.9 C) (Oral)  10/08/17 (!) 97.1 F (36.2 C) (Oral)   BP Readings from Last 3 Encounters:  10/25/17 (!) 141/54  10/18/17 (!) 119/56  10/08/17 130/69   Pulse Readings from Last 3 Encounters:  10/25/17 82  10/18/17 70  10/08/17 80   Constitutional: Well-developed, well-nourished, and in no distress.  HENT: Head: Normocephalic and atraumatic.  Mouth/Throat: No oropharyngeal exudate. Mucosa moist. Eyes: Pupils are equal, round, and reactive to light. Conjunctivae are normal. No scleral icterus.  Neck: Normal range of motion. Neck supple. No JVD present.  Cardiovascular: Normal rate, regular rhythm and normal heart sounds.  Exam reveals no gallop and no friction rub.   No murmur heard. Pulmonary/Chest: Effort normal and breath sounds normal. No respiratory distress. No wheezes.No rales.  Abdominal: Soft. Bowel sounds are normal. No distension. There is no tenderness. There is no guarding.  Musculoskeletal: No edema or tenderness.  Lymphadenopathy: No cervical, axillary or supraclavicular adenopathy.  Neurological: Alert and oriented to person, place, and time. No cranial nerve deficit.  Skin: Skin is warm and dry. No rash noted. No erythema. No pallor.  Psychiatric: Affect and judgment normal.   Labs Orders Only on 08/16/2017  Component Date Value Ref Range Status  . WBC 08/16/2017 5.6  4.0 - 10.5 K/uL Final  . RBC 08/16/2017 3.57* 3.87 - 5.11 MIL/uL Final  . Hemoglobin 08/16/2017 10.5* 12.0 - 15.0 g/dL Final  . HCT 08/16/2017 33.5* 36.0 - 46.0 % Final  . MCV 08/16/2017 93.8  78.0 - 100.0 fL Final  . MCH 08/16/2017 29.4  26.0 - 34.0 pg Final  . MCHC  08/16/2017 31.3  30.0 - 36.0 g/dL Final  . RDW 08/16/2017 17.2* 11.5 - 15.5 % Final  . Platelets 08/16/2017 305  150 - 400 K/uL Final  . Neutrophils Relative % 08/16/2017 70  % Final  . Neutro Abs 08/16/2017 4.0  1.7 - 7.7 K/uL Final  . Lymphocytes Relative 08/16/2017 17  % Final  . Lymphs Abs 08/16/2017 0.9  0.7 - 4.0 K/uL Final  . Monocytes Relative 08/16/2017 5  % Final  . Monocytes Absolute 08/16/2017 0.3  0.1 - 1.0 K/uL Final  . Eosinophils Relative 08/16/2017 7  % Final  . Eosinophils Absolute 08/16/2017 0.4  0.0 - 0.7 K/uL Final  . Basophils Relative 08/16/2017 1  % Final  . Basophils Absolute 08/16/2017 0.0  0.0 - 0.1 K/uL Final   Performed at [facility], [address] [place], [place] [address]  . Sodium 08/16/2017 144  135 - 145 mmol/L Final  . Potassium 08/16/2017 4.8  3.5 - 5.1 mmol/L Final  . Chloride 08/16/2017 112* 101 - 111 mmol/L Final  . CO2 08/16/2017 21* 22 - 32 mmol/L Final  . Glucose, Bld 08/16/2017 97  65 - 99 mg/dL Final  . BUN 08/16/2017 50* 6 - 20 mg/dL Final  . Creatinine, Ser 08/16/2017 2.25* 0.44 - 1.00 mg/dL Final  . Calcium 08/16/2017 9.0  8.9 - 10.3 mg/dL Final  . Total Protein 08/16/2017 6.7  6.5 - 8.1 g/dL Final  . Albumin 08/16/2017 3.5  3.5 - 5.0 g/dL Final  . AST 08/16/2017 16  15 - 41 U/L Final  . ALT 08/16/2017 12* 14 - 54 U/L Final  . Alkaline Phosphatase 08/16/2017 56  38 - 126 U/L Final  . Total Bilirubin 08/16/2017 0.6  0.3 - 1.2 mg/dL Final  . GFR calc non Af Amer 08/16/2017 20* >60 mL/min Final  . GFR calc Af Amer 08/16/2017 23* >60 mL/min Final   Comment: (NOTE) The eGFR has been calculated using the CKD EPI equation. This calculation has not been validated in all clinical situations. eGFR's persistently <60 mL/min signify possible Chronic Kidney Disease.   Georgiann Hahn gap 08/16/2017 11  5 - 15 Final   Performed at [facility], [place]  [address] [place], [place] [address]  . LDH 08/16/2017 134  98 - 192 U/L Final   Performed at [facility], [address], [place], [place] [address]  . Total Protein ELP 08/16/2017 6.2  6.0 - 8.5 g/dL Final  . Albumin ELP 08/16/2017 3.4  2.9 - 4.4 g/dL Final  . Alpha-1-Globulin 08/16/2017 0.2  0.0 - 0.4 g/dL Final  . Alpha-2-Globulin 08/16/2017 0.8  0.4 - 1.0 g/dL Final  . Beta Globulin 08/16/2017 1.1  0.7 - 1.3 g/dL Final  . Gamma Globulin 08/16/2017 0.7  0.4 - 1.8 g/dL Final  . M-Spike, % 08/16/2017 Not Observed  Not Observed g/dL Final  . SPE Interp. 08/16/2017 Comment   Final   Comment: (NOTE) The SPE pattern appears essentially unremarkable. Evidence of monoclonal protein is not apparent. Performed At: [facility] [name], [place] [id number] Rush Farmer MD GG:[id number]   . Comment 08/16/2017 Comment   Final   Comment: (NOTE) Protein electrophoresis scan will follow via computer, mail, or courier delivery.   Marland Kitchen GLOBULIN, TOTAL 08/16/2017 2.8  2.2 - 3.9 g/dL Corrected  . A/G Ratio 08/16/2017 1.2  0.7 - 1.7 Corrected   Performed at [facility], [address], [place], [place] [address]  . Ferritin 08/16/2017 204  11 - 307 ng/mL Final   Performed at [facility], [place] [address] [place], [place] [address]  . Blood Bank Specimen 08/16/2017 SAMPLE AVAILABLE FOR TESTING   Final  . Sample Expiration 08/16/2017    Final                   Value:08/19/2017 Performed at [facility], [address] [place], [place] [address]      Pathology Orders Placed This Encounter  Procedures  . CBC with Differential/Platelet    Standing Status:   Future    Number of Occurrences:   1    Standing Expiration Date:   08/17/2018  . Comprehensive metabolic panel    Standing Status:   Future    Number of Occurrences:   1    Standing Expiration Date:   08/17/2018  . Lactate dehydrogenase    Standing Status:   Future    Number of Occurrences:   1    Standing Expiration Date:   08/17/2018  . Protein electrophoresis, serum    Standing Status:   Future    Number  of Occurrences:   1    Standing Expiration Date:   08/17/2018  . Ferritin    Standing Status:   Future    Number of Occurrences:   1    Standing Expiration Date:   08/17/2018  . CBC with Differential/Platelet    Standing Status:   Future    Number of Occurrences:   1    Standing Expiration Date:   08/17/2018  . Comprehensive metabolic panel    Standing Status:   Future    Standing Expiration Date:   08/17/2018  . Lactate dehydrogenase    Standing Status:   Future    Standing Expiration Date:   08/17/2018  . Ferritin    Standing Status:   Future    Standing Expiration Date:   08/17/2018  . [place] COMMUNICATION LAB    Lab appointment 10 min.  Marland Kitchen SCHEDULING COMMUNICATION INJECTION    Schedule injection appointment 15 min  . ARANESP TREATMENT CONDITION    Hold Aranesp: Chemotherapy Induced Anemia hold for Hemoglobin greater than 10 / Renal hold for Hemoglobin  greater than 11       Zoila Shutter MD

## 2017-11-08 ENCOUNTER — Ambulatory Visit: Payer: Medicare HMO | Admitting: Family Medicine

## 2017-11-08 ENCOUNTER — Ambulatory Visit (HOSPITAL_COMMUNITY): Payer: Medicare HMO | Admitting: Adult Health

## 2017-11-08 ENCOUNTER — Other Ambulatory Visit (HOSPITAL_COMMUNITY): Payer: Medicare HMO

## 2017-11-08 ENCOUNTER — Ambulatory Visit (HOSPITAL_COMMUNITY): Payer: Medicare HMO

## 2017-11-11 ENCOUNTER — Encounter: Payer: Self-pay | Admitting: "Endocrinology

## 2017-11-11 ENCOUNTER — Ambulatory Visit: Payer: Medicare HMO | Admitting: "Endocrinology

## 2017-11-11 VITALS — BP 131/68 | HR 76 | Ht 64.0 in | Wt 181.0 lb

## 2017-11-11 DIAGNOSIS — I10 Essential (primary) hypertension: Secondary | ICD-10-CM

## 2017-11-11 DIAGNOSIS — N184 Chronic kidney disease, stage 4 (severe): Secondary | ICD-10-CM

## 2017-11-11 DIAGNOSIS — E039 Hypothyroidism, unspecified: Secondary | ICD-10-CM | POA: Diagnosis not present

## 2017-11-11 DIAGNOSIS — E1122 Type 2 diabetes mellitus with diabetic chronic kidney disease: Secondary | ICD-10-CM | POA: Diagnosis not present

## 2017-11-11 DIAGNOSIS — E782 Mixed hyperlipidemia: Secondary | ICD-10-CM | POA: Diagnosis not present

## 2017-11-11 MED ORDER — INSULIN GLARGINE 300 UNIT/ML ~~LOC~~ SOPN: 20.0000 [IU] | PEN_INJECTOR | Freq: Every day | SUBCUTANEOUS | 2 refills | Status: DC

## 2017-11-11 MED ORDER — LEVOTHYROXINE SODIUM 50 MCG PO TABS: ORAL_TABLET | ORAL | 1 refills | Status: DC

## 2017-11-11 NOTE — Patient Instructions (Signed)

## 2017-11-11 NOTE — Progress Notes (Signed)
Subjective:    Patient ID: [name], female    DOB: Feb 12, 1941. Patient is being seen in f/u for management of diabetes requested by  Arnoldo Lenis, MD  Past Medical History:  Diagnosis Date  . Anemia   . Arthritis   . Blood transfusion without reported diagnosis   . Cataract   . Chronic anemia   . Chronic combined systolic and diastolic CHF (congestive heart failure) ([place])    a. 2D echo 08/2016 at [facility]: EF 50-55% with inferior wall HK, impaired LV filling, fair study.  . CKD (chronic kidney disease), stage III ([place])   . Diabetes ([place])   . Gastritis   . GERD (gastroesophageal reflux disease)   . Gout   . HTN (hypertension)   . Hyperlipidemia   . Hypothyroidism   . Iron deficiency anemia 11/08/2016  . Normocytic anemia 10/26/2016  . NSTEMI (non-ST elevated myocardial infarction) ([place])    a. Complex admission 08/2016 - with severe hyperglycemia, AKI on CKD, severe anemia down to Hgb 6.8, acute combined CHF, troponin of 8.5, cath deferred due to renal dysfunction.  . Thyroid disease    Past Surgical History:  Procedure Laterality Date  . COLONOSCOPY WITH PROPOFOL N/A 10/08/2016   Procedure: COLONOSCOPY WITH PROPOFOL;  Surgeon: Ronnette Juniper, MD;  Location: [place];  Service: Gastroenterology;  Laterality: N/A;  . ENTEROSCOPY N/A 08/08/2017   Procedure: ENTEROSCOPY;  Surgeon: Daneil Dolin, MD;  [name];  Service: Endoscopy;  Laterality: N/A;  . ESOPHAGOGASTRODUODENOSCOPY N/A 10/06/2016   Procedure: ESOPHAGOGASTRODUODENOSCOPY (EGD);  Surgeon: Otis Brace, MD;  Location: [facility] ENDOSCOPY;  Service: Gastroenterology;  Laterality: N/A;  . ESOPHAGOGASTRODUODENOSCOPY (EGD) WITH PROPOFOL N/A 03/12/2017   Procedure: ESOPHAGOGASTRODUODENOSCOPY (EGD) WITH PROPOFOL;  Surgeon: Danie Binder, MD;  [name];  Service: Endoscopy;  Laterality: N/A;  . ESOPHAGOGASTRODUODENOSCOPY (EGD) WITH PROPOFOL N/A 08/08/2017   Procedure: ESOPHAGOGASTRODUODENOSCOPY  (EGD) WITH PROPOFOL;  Surgeon: Daneil Dolin, MD;  [name];  Service: Endoscopy;  Laterality: N/A;  use pediatric colonoscope per Dr. Gala Romney  . GIVENS CAPSULE STUDY  10/08/2016   Procedure: GIVENS CAPSULE STUDY;  Surgeon: Ronnette Juniper, MD;  Location: [place];  Service: Gastroenterology;;  . Freda Munro CAPSULE STUDY N/A 10/29/2016   Procedure: GIVENS CAPSULE STUDY;  Surgeon: Danie Binder, MD;  [name];  Service: Endoscopy;  Laterality: N/A;  . ORIF HUMERUS FRACTURE Left 02/07/2015   Procedure: OPEN REDUCTION INTERNAL FIXATION (ORIF) PROXIMAL HUMERUS FRACTURE;  Surgeon: Marybelle Killings, MD;  Location: [place];  Service: Orthopedics;  Laterality: Left;  . THYROID SURGERY    . TOTAL HIP ARTHROPLASTY Left 02/07/2015   Procedure: TOTAL HIP ARTHROPLASTY ANTERIOR APPROACH ;  Surgeon: Marybelle Killings, MD;  Location: [place];  Service: Orthopedics;  Laterality: Left;  . WRIST SURGERY Left    Social History   Socioeconomic History  . Marital status: Widowed    Spouse name: [name]  . Number of children: 2  . Years of education: 21  . Highest education level: Not on file  Occupational History  . Occupation: retired  Scientific laboratory technician  . Financial resource strain: Not on file  . Food insecurity:    Worry: Not on file    Inability: Not on file  . Transportation needs:    Medical: Not on file    Non-medical: Not on file  Tobacco Use  . Smoking status: Never Smoker  . Smokeless tobacco: Never Used  Substance and  Sexual Activity  . Alcohol use: No    Alcohol/week: 0.0 oz  . Drug use: No  . Sexual activity: Not Currently  Lifestyle  . Physical activity:    Days per week: Not on file    Minutes per session: Not on file  . Stress: Not on file  Relationships  . Social connections:    Talks on phone: Not on file    Gets together: Not on file    Attends religious service: Not on file    Active member of club or organization: Not on file    Attends meetings of clubs or  organizations: Not on file    Relationship status: Not on file  Other Topics Concern  . Not on file  Social History Narrative   Lives with significant other/partner [name]   He is her caregiver   [place] from [facility]   Outpatient Encounter Medications as of 11/11/2017  Medication Sig  . acetaminophen (TYLENOL) 325 MG tablet Take 2 tablets (650 mg total) by mouth every 6 (six) hours as needed for mild pain or headache (or Fever >/= 101).  Marland Kitchen amLODipine (NORVASC) 10 MG tablet Take 1 tablet (10 mg total) by mouth daily.  Marland Kitchen docusate sodium (COLACE) 100 MG capsule Take 1 capsule (100 mg total) by mouth every 12 (twelve) hours. (Patient taking differently: Take 100 mg by mouth daily. )  . furosemide (LASIX) 20 MG tablet Take 20 mg by mouth as needed. Only has to take it when she gains 2 lbs  . gabapentin (NEURONTIN) 300 MG capsule Take 2 capsules (600 mg total) by mouth at bedtime.  . hydrALAZINE (APRESOLINE) 25 MG tablet Take 1 tablet (25 mg total) by mouth every 8 (eight) hours as needed. (Patient taking differently: Take 25 mg by mouth every morning. )  . Insulin Glargine (TOUJEO MAX SOLOSTAR) 300 UNIT/ML SOPN Inject 20 Units into the skin at bedtime.  Marland Kitchen levothyroxine (SYNTHROID, LEVOTHROID) 50 MCG tablet TAKE 1 TABLET BY MOUTH ONCE DAILY BEFORE  BREAKFAST  . metoprolol tartrate (LOPRESSOR) 25 MG tablet Take 0.5 tablets (12.5 mg total) by mouth 2 (two) times daily.  . Multiple Vitamin (MULTIVITAMIN WITH MINERALS) TABS tablet Take 1 tablet by mouth daily.  . ondansetron (ZOFRAN) 4 MG tablet Take 1 tablet (4 mg total) by mouth every 8 (eight) hours as needed for nausea or vomiting.  . pantoprazole (PROTONIX) 40 MG tablet Take 1 tablet (40 mg total) by mouth 2 (two) times daily. 30 minutes before breakfast  . ULORIC 40 MG tablet Take 1 tablet by mouth daily.  . [DISCONTINUED] Insulin Glargine (TOUJEO MAX SOLOSTAR) 300 UNIT/ML SOPN Inject 20 Units at bedtime into the skin. (Patient taking differently: Inject 24  Units into the skin at bedtime. )  . [DISCONTINUED] levothyroxine (SYNTHROID, LEVOTHROID) 50 MCG tablet TAKE 1 TABLET BY MOUTH ONCE DAILY BEFORE  BREAKFAST   Facility-Administered Encounter Medications as of 11/11/2017  Medication  . Darbepoetin Alfa (ARANESP) injection 200 mcg   ALLERGIES: Allergies  Allergen Reactions  . Insulin Glargine Swelling    "Makes me swell like a balloon all over", including face, but without any respiratory distress or rashes. Associated with weight gain.  . Statins Other (See Comments)    "I've tried them all; my muscle aches were so bad I couldn't walk".   VACCINATION STATUS: Immunization History  Administered Date(s) Administered  . Influenza,inj,Quad PF,6+ Mos 03/15/2017  . Pneumococcal Polysaccharide-23 09/20/2016    Diabetes  She presents for her follow-up diabetic  visit. She has type 2 diabetes mellitus. Onset time: She was diagnosed at approximate age of 32 years. Her disease course has been improving (She comes in prior to her scheduled appointment due to recent hospitalization with GI bleed when she was observed to have above target blood glucose readings. Her blood glucose readings have largely stabilized since her discharge from hospital.). Hypoglycemia symptoms include confusion, dizziness, mood changes and sweats. Pertinent negatives for hypoglycemia include no headaches, pallor or seizures. There are no diabetic associated symptoms. Pertinent negatives for diabetes include no chest pain, no polydipsia, no polyphagia and no polyuria. There are no hypoglycemic complications. Symptoms are improving. Diabetic complications include nephropathy. Risk factors for coronary artery disease include diabetes mellitus, hypertension and post-menopausal. Current diabetic treatment includes oral agent (monotherapy) (She is taking glipizide 10 mg by mouth twice a day, her recent A1c is 5.3%. She reports frequent hypoglycemia into the 50s.). Her weight is increasing  steadily. She is following a generally unhealthy diet. When asked about meal planning, she reported none. She has not had a previous visit with a dietitian. She never participates in exercise. Her home blood glucose trend is increasing steadily. Her breakfast blood glucose range is generally 130-140 mg/dl. Her bedtime blood glucose range is generally 140-180 mg/dl. Her overall blood glucose range is 130-140 mg/dl. An ACE inhibitor/angiotensin II receptor blocker is being taken. Eye exam is current.  Hypertension  This is a chronic problem. The current episode started more than 1 year ago. Associated symptoms include sweats. Pertinent negatives include no chest pain, headaches, palpitations or shortness of breath. Past treatments include calcium channel blockers and direct vasodilators. Hypertensive end-organ damage includes kidney disease.     Review of Systems  Constitutional: Negative for chills, fever and unexpected weight change.  HENT: Negative for trouble swallowing and voice change.   Eyes: Negative for visual disturbance.  Respiratory: Negative for cough, shortness of breath and wheezing.   Cardiovascular: Negative for chest pain, palpitations and leg swelling.  Gastrointestinal: Negative for diarrhea, nausea and vomiting.  Endocrine: Negative for cold intolerance, heat intolerance, polydipsia, polyphagia and polyuria.  Musculoskeletal: Negative for arthralgias and myalgias.  Skin: Negative for color change, pallor, rash and wound.  Neurological: Positive for dizziness. Negative for seizures and headaches.  Psychiatric/Behavioral: Positive for confusion. Negative for suicidal ideas.    Objective:    BP 131/68   Pulse 76   Ht 5\' 4"  (1.626 m)   Wt 181 lb (82.1 kg)   BMI 31.07 kg/m   Wt Readings from Last 3 Encounters:  11/11/17 181 lb (82.1 kg)  10/25/17 177 lb 3.2 oz (80.4 kg)  10/18/17 177 lb 7.5 oz (80.5 kg)    Physical Exam  Constitutional: She is oriented to person,  place, and time. She appears well-developed.  HENT:  Head: Normocephalic and atraumatic.  Eyes: EOM are normal.  Neck: Normal range of motion. Neck supple. No tracheal deviation present. No thyromegaly present.  Cardiovascular: Normal rate.  Pulmonary/Chest: Effort normal.  Abdominal: There is no tenderness. There is no guarding.  Musculoskeletal: Normal range of motion. She exhibits no edema.  Neurological: She is alert and oriented to person, place, and time. No cranial nerve deficit. Coordination normal.  Skin: Skin is warm and dry. No rash noted. No erythema. No pallor.  Psychiatric: She has a normal mood and affect. Judgment normal.     CMP     Component Value Date/Time   NA 142 11/06/2017 1123   K 4.4 11/06/2017 1123  CL 105 11/06/2017 1123   CO2 26 11/06/2017 1123   GLUCOSE 112 (H) 11/06/2017 1123   BUN 44 (H) 11/06/2017 1123   CREATININE 2.48 (H) 11/06/2017 1123   CREATININE 2.58 (H) 10/25/2016 1026   CALCIUM 9.2 11/06/2017 1123   CALCIUM 9.1 07/22/2017 1120   PROT 7.1 11/06/2017 1123   ALBUMIN 3.7 11/06/2017 1123   AST 24 11/06/2017 1123   ALT 13 (L) 11/06/2017 1123   ALKPHOS 64 11/06/2017 1123   BILITOT 1.0 11/06/2017 1123   GFRNONAA 18 (L) 11/06/2017 1123   GFRNONAA 17 (L) 10/25/2016 1026   GFRAA 20 (L) 11/06/2017 1123   GFRAA 20 (L) 10/25/2016 1026     Diabetic Labs (most recent): Lab Results  Component Value Date   HGBA1C 6.1 (H) 11/06/2017   HGBA1C 7.3 (H) 06/12/2017   HGBA1C 6.8 (H) 02/04/2017    Assessment & Plan:   1. Diabetes mellitus with stage 4 chronic kidney disease ([place])  - Patient has currently uncontrolled symptomatic type 2 DM since 77 years of age. - She came with fasting and postprandial blood glucose near target.  Her recent A1c is 6.1% improving from 7.3%.     Recent labs reviewed, showing stage 4 CKD.   - her diabetes is complicated by CKD and patient remains at a high risk for more acute and chronic complications which include  CAD, CVA, ERSD, retinopathy, and neuropathy. These are all discussed in detail with the patient.  - I have counseled the patient on diet management and weight loss, by adopting a carbohydrate restricted/protein rich diet.  -  Suggestion is made for her to avoid simple carbohydrates  from her diet including Cakes, Sweet Desserts / Pastries, Ice Cream, Soda (diet and regular), Sweet Tea, Candies, Chips, Cookies, Store Bought Juices, Alcohol in Excess of  1-2 drinks a day, Artificial Sweeteners, and "Sugar-free" Products. This will help patient to have stable blood glucose profile and potentially avoid unintended weight gain.  - I encouraged the patient to switch to  unprocessed or minimally processed complex starch and increased protein intake (animal or plant source), fruits, and vegetables.  - Patient is advised to stick to a routine mealtimes to eat 3 meals  a day and avoid unnecessary snacks ( to snack only to correct hypoglycemia).   - I have approached patient with the following individualized plan to manage diabetes and patient agrees:   - Main goal in her therapy still  would be to avoid hypoglycemia. - Her presentation today is better in several ways including absence of hypoglycemia . -  She would not need additional treatment for diabetes. I advised her to decrease her basal insulin Toujeo to 20 units daily at bedtime associated with monitoring of blood glucose 2 times daily-before breakfast and bedtime.    -Patient is encouraged to call clinic for blood glucose levels less than 70 or above 200 mg /dl.  -Patient is not a candidate for metformin, SGLT2 inhibitors  due to CKD.  - Patient specific target  A1c;  LDL, HDL, Triglycerides, and  Waist Circumference were discussed in detail.  2) BP/HTN: Her blood pressure is controlled to target.  She is advised to continue her current blood pressure medications including amlodipine 10 mg p.o. daily, Lasix as needed, hydralazine 25 mg every 8  hours, metoprolol 12.5 mg p.o. twice daily.  She is not on ACEI/ARB- will be considered next visit if not initiated by nephrology.  3) Lipids/HPL:   She has a reported history  of statin allergy, her recent triglycerides are elevated at 662 increasing from 476. -She did not tolerate fenofibrate, statins.   4)  Weight/Diet: CDE Consult has been initiated , exercise, and detailed carbohydrates information provided.  5) hypothyroidism: -Her recent thyroid function tests are consistent with appropriate replacement.  I  advised her to continue levothyroxine 125 g by mouth every morning.   - We discussed about correct intake of levothyroxine, at fasting, with water, separated by at least 30 minutes from breakfast, and separated by more than 4 hours from calcium, iron, multivitamins, acid reflux medications (PPIs). -Patient is made aware of the fact that thyroid hormone replacement is needed for life, dose to be adjusted by periodic monitoring of thyroid function tests.  6) Chronic Care/Health Maintenance:  -Patient is not  on ACEI/ARB and Statin medications and encouraged to continue to follow up with Ophthalmology, Podiatrist at least yearly or according to recommendations, and advised to  stay away from smoking. I have recommended yearly flu vaccine and pneumonia vaccination at least every 5 years; moderate intensity exercise for up to 150 minutes weekly; and  sleep for at least 7 hours a day.  - I advised patient to maintain close follow up with Branch, Alphonse Guild, MD cardiology care and her PMD for primary care needs.  - Time spent with the patient: 25 min, of which >50% was spent in reviewing her blood glucose logs , discussing her hypo- and hyper-glycemic episodes, reviewing her current and  previous labs and insulin doses and developing a plan to avoid hypo- and hyper-glycemia. Please refer to Patient Instructions for Blood Glucose Monitoring and Insulin/Medications Dosing Guide"  in media tab  for additional information. Landry Mellow participated in the discussions, expressed understanding, and voiced agreement with the above plans.  All questions were answered to her satisfaction. she is encouraged to contact clinic should she have any questions or concerns prior to her return visit.   Follow up plan: - Return in about 6 months (around 05/13/2018) for meter, and logs.  Glade [name], MD Phone: [phone]  Fax: [phone]   -  This note was partially dictated with voice recognition software. Similar sounding words can be transcribed inadequately or may not  be corrected upon review.  11/11/2017, 5:22 PM

## 2017-11-12 ENCOUNTER — Telehealth: Payer: Self-pay | Admitting: Gastroenterology

## 2017-11-12 MED ORDER — PANTOPRAZOLE SODIUM 40 MG PO TBEC: 40.0000 mg | DELAYED_RELEASE_TABLET | Freq: Two times a day (BID) | ORAL | 3 refills | Status: DC

## 2017-11-12 NOTE — Addendum Note (Signed)
Addended by: Annitta Needs on: 11/12/2017 04:50 PM   Modules accepted: Orders

## 2017-11-12 NOTE — Telephone Encounter (Signed)
Done

## 2017-11-12 NOTE — Telephone Encounter (Signed)
Pt said she needed a 90 day supply of pantoprazole called to [facility]. [phone]

## 2017-11-12 NOTE — Telephone Encounter (Signed)
Forwarding to the refill box.  

## 2017-11-17 ENCOUNTER — Encounter: Payer: Self-pay | Admitting: Gastroenterology

## 2017-11-18 MED ORDER — GABAPENTIN 300 MG PO CAPS: 600.0000 mg | ORAL_CAPSULE | Freq: Every day | ORAL | 0 refills | Status: DC

## 2017-11-21 ENCOUNTER — Other Ambulatory Visit (HOSPITAL_COMMUNITY): Payer: Self-pay | Admitting: Pharmacist

## 2017-11-22 ENCOUNTER — Ambulatory Visit (HOSPITAL_COMMUNITY): Payer: Medicare HMO

## 2017-11-22 ENCOUNTER — Ambulatory Visit (HOSPITAL_COMMUNITY): Payer: Medicare HMO | Admitting: Hematology

## 2017-11-22 ENCOUNTER — Inpatient Hospital Stay (HOSPITAL_COMMUNITY): Payer: Medicare HMO | Attending: Hematology

## 2017-11-22 ENCOUNTER — Other Ambulatory Visit (HOSPITAL_COMMUNITY): Payer: Medicare HMO

## 2017-11-22 ENCOUNTER — Inpatient Hospital Stay (HOSPITAL_COMMUNITY): Payer: Medicare HMO

## 2017-11-22 ENCOUNTER — Other Ambulatory Visit (HOSPITAL_COMMUNITY): Payer: Self-pay | Admitting: Pharmacist

## 2017-11-22 DIAGNOSIS — Z79899 Other long term (current) drug therapy: Secondary | ICD-10-CM | POA: Insufficient documentation

## 2017-11-22 DIAGNOSIS — I130 Hypertensive heart and chronic kidney disease with heart failure and stage 1 through stage 4 chronic kidney disease, or unspecified chronic kidney disease: Secondary | ICD-10-CM | POA: Diagnosis not present

## 2017-11-22 DIAGNOSIS — K922 Gastrointestinal hemorrhage, unspecified: Secondary | ICD-10-CM | POA: Diagnosis not present

## 2017-11-22 DIAGNOSIS — D500 Iron deficiency anemia secondary to blood loss (chronic): Secondary | ICD-10-CM | POA: Insufficient documentation

## 2017-11-22 DIAGNOSIS — N184 Chronic kidney disease, stage 4 (severe): Secondary | ICD-10-CM | POA: Insufficient documentation

## 2017-11-22 DIAGNOSIS — D509 Iron deficiency anemia, unspecified: Secondary | ICD-10-CM

## 2017-11-22 LAB — COMPREHENSIVE METABOLIC PANEL
ALBUMIN: 3.9 g/dL (ref 3.5–5.0)
ALT: 9 U/L (ref 0–44)
AST: 15 U/L (ref 15–41)
Alkaline Phosphatase: 68 U/L (ref 38–126)
Anion gap: 9 (ref 5–15)
BUN: 48 mg/dL — ABNORMAL HIGH (ref 8–23)
CHLORIDE: 107 mmol/L (ref 98–111)
CO2: 25 mmol/L (ref 22–32)
Calcium: 9.3 mg/dL (ref 8.9–10.3)
Creatinine, Ser: 2.46 mg/dL — ABNORMAL HIGH (ref 0.44–1.00)
GFR calc non Af Amer: 18 mL/min — ABNORMAL LOW (ref >60)
GFR, EST AFRICAN AMERICAN: 21 mL/min — AB (ref >60)
Glucose, Bld: 120 mg/dL — ABNORMAL HIGH (ref 70–99)
Potassium: 5.1 mmol/L (ref 3.5–5.1)
SODIUM: 141 mmol/L (ref 135–145)
Total Bilirubin: 0.5 mg/dL (ref 0.3–1.2)
Total Protein: 7.4 g/dL (ref 6.5–8.1)

## 2017-11-22 LAB — CBC WITH DIFFERENTIAL/PLATELET
BASOS PCT: 1 %
Basophils Absolute: 0.1 10*3/uL (ref 0.0–0.1)
Eosinophils Absolute: 0.3 10*3/uL (ref 0.0–0.7)
Eosinophils Relative: 5 %
HCT: 34.9 % — ABNORMAL LOW (ref 36.0–46.0)
HEMOGLOBIN: 11.2 g/dL — AB (ref 12.0–15.0)
LYMPHS ABS: 1.3 10*3/uL (ref 0.7–4.0)
Lymphocytes Relative: 22 %
MCH: 29 pg (ref 26.0–34.0)
MCHC: 32.1 g/dL (ref 30.0–36.0)
MCV: 90.4 fL (ref 78.0–100.0)
Monocytes Absolute: 0.4 10*3/uL (ref 0.1–1.0)
Monocytes Relative: 8 %
NEUTROS PCT: 64 %
Neutro Abs: 3.8 10*3/uL (ref 1.7–7.7)
Platelets: 299 10*3/uL (ref 150–400)
RBC: 3.86 MIL/uL — ABNORMAL LOW (ref 3.87–5.11)
RDW: 14.5 % (ref 11.5–15.5)
WBC: 5.9 10*3/uL (ref 4.0–10.5)

## 2017-11-22 LAB — PROTEIN / CREATININE RATIO, URINE
CREATININE, URINE: 87.01 mg/dL
Protein Creatinine Ratio: 2.44 mg/mg{Cre} — ABNORMAL HIGH (ref 0.00–0.15)
TOTAL PROTEIN, URINE: 212 mg/dL

## 2017-11-22 LAB — LACTATE DEHYDROGENASE: LDH: 146 U/L (ref 98–192)

## 2017-11-22 NOTE — Progress Notes (Signed)
Medication not given due to Hgb 11.2. Per Dr. Delton Coombes hold Retacrit at this time. Pt aware and a new schedule given.

## 2017-11-23 LAB — PTH, INTACT AND CALCIUM
Calcium, Total (PTH): 9.3 mg/dL (ref 8.7–10.3)
PTH: 64 pg/mL (ref 15–65)

## 2017-11-23 LAB — IRON AND TIBC
Iron: 73 ug/dL (ref 28–170)
Saturation Ratios: 27 % (ref 10.4–31.8)
TIBC: 270 ug/dL (ref 250–450)
UIBC: 197 ug/dL

## 2017-11-23 LAB — VITAMIN D 25 HYDROXY (VIT D DEFICIENCY, FRACTURES): VIT D 25 HYDROXY: 26.5 ng/mL — AB (ref 30.0–100.0)

## 2017-11-23 LAB — FERRITIN: Ferritin: 210 ng/mL (ref 11–307)

## 2017-11-24 LAB — PROTEIN ELECTROPHORESIS, SERUM
A/G RATIO SPE: 1.1 (ref 0.7–1.7)
ALBUMIN ELP: 3.5 g/dL (ref 2.9–4.4)
Alpha-1-Globulin: 0.2 g/dL (ref 0.0–0.4)
Alpha-2-Globulin: 1 g/dL (ref 0.4–1.0)
Beta Globulin: 1.1 g/dL (ref 0.7–1.3)
GLOBULIN, TOTAL: 3.1 g/dL (ref 2.2–3.9)
Gamma Globulin: 0.8 g/dL (ref 0.4–1.8)
TOTAL PROTEIN ELP: 6.6 g/dL (ref 6.0–8.5)

## 2017-11-25 ENCOUNTER — Other Ambulatory Visit: Payer: Self-pay

## 2017-11-25 ENCOUNTER — Other Ambulatory Visit: Payer: Self-pay | Admitting: *Deleted

## 2017-11-25 MED ORDER — GLUCOSE BLOOD VI STRP: 1.0000 | ORAL_STRIP | Freq: Four times a day (QID) | 5 refills | Status: DC

## 2017-11-26 DIAGNOSIS — I10 Essential (primary) hypertension: Secondary | ICD-10-CM | POA: Diagnosis not present

## 2017-11-26 DIAGNOSIS — M9080 Osteopathy in diseases classified elsewhere, unspecified site: Secondary | ICD-10-CM | POA: Diagnosis not present

## 2017-11-26 DIAGNOSIS — E889 Metabolic disorder, unspecified: Secondary | ICD-10-CM | POA: Diagnosis not present

## 2017-11-26 DIAGNOSIS — E1129 Type 2 diabetes mellitus with other diabetic kidney complication: Secondary | ICD-10-CM | POA: Diagnosis not present

## 2017-11-26 DIAGNOSIS — N184 Chronic kidney disease, stage 4 (severe): Secondary | ICD-10-CM | POA: Diagnosis not present

## 2017-11-26 DIAGNOSIS — I509 Heart failure, unspecified: Secondary | ICD-10-CM | POA: Diagnosis not present

## 2017-11-26 DIAGNOSIS — R809 Proteinuria, unspecified: Secondary | ICD-10-CM | POA: Diagnosis not present

## 2017-11-26 NOTE — Telephone Encounter (Signed)
Patient called stating the pharmacy never received Rx for pantoprazole last month.

## 2017-11-27 ENCOUNTER — Telehealth: Payer: Self-pay | Admitting: Gastroenterology

## 2017-11-27 ENCOUNTER — Encounter: Payer: Self-pay | Admitting: Gastroenterology

## 2017-11-27 ENCOUNTER — Other Ambulatory Visit: Payer: Self-pay

## 2017-11-27 MED ORDER — GLUCOSE BLOOD VI STRP: 1.0000 | ORAL_STRIP | Freq: Four times a day (QID) | 2 refills | Status: DC

## 2017-11-27 MED ORDER — PANTOPRAZOLE SODIUM 40 MG PO TBEC: 40.0000 mg | DELAYED_RELEASE_TABLET | Freq: Two times a day (BID) | ORAL | 3 refills | Status: DC

## 2017-11-27 MED ORDER — GLUCOSE BLOOD VI STRP: 1.0000 | ORAL_STRIP | Freq: Four times a day (QID) | 5 refills | Status: DC

## 2017-11-27 NOTE — Telephone Encounter (Signed)
Patient called and stated she has called 3 times and her protonix is still not filled.  It was supposed to be sent to her  [place].  Can we have someone please send in the refill

## 2017-11-27 NOTE — Telephone Encounter (Signed)
PT is aware Rx was sent in this morning to her pharmacy and note said it was received there.

## 2017-12-13 ENCOUNTER — Other Ambulatory Visit: Payer: Self-pay | Admitting: Gastroenterology

## 2017-12-13 ENCOUNTER — Encounter: Payer: Self-pay | Admitting: Gastroenterology

## 2017-12-13 MED ORDER — ONDANSETRON HCL 4 MG PO TABS: 4.0000 mg | ORAL_TABLET | Freq: Three times a day (TID) | ORAL | 3 refills | Status: DC | PRN

## 2017-12-13 NOTE — Progress Notes (Signed)
Zofran sent to pharmacy

## 2017-12-20 ENCOUNTER — Inpatient Hospital Stay (HOSPITAL_COMMUNITY): Payer: Medicare HMO | Attending: Hematology

## 2017-12-20 ENCOUNTER — Inpatient Hospital Stay (HOSPITAL_COMMUNITY): Payer: Medicare HMO

## 2017-12-20 VITALS — BP 144/51 | HR 61 | Resp 16

## 2017-12-20 DIAGNOSIS — D500 Iron deficiency anemia secondary to blood loss (chronic): Secondary | ICD-10-CM

## 2017-12-20 DIAGNOSIS — N184 Chronic kidney disease, stage 4 (severe): Secondary | ICD-10-CM | POA: Diagnosis not present

## 2017-12-20 DIAGNOSIS — I130 Hypertensive heart and chronic kidney disease with heart failure and stage 1 through stage 4 chronic kidney disease, or unspecified chronic kidney disease: Secondary | ICD-10-CM | POA: Diagnosis not present

## 2017-12-20 DIAGNOSIS — Z79899 Other long term (current) drug therapy: Secondary | ICD-10-CM | POA: Insufficient documentation

## 2017-12-20 DIAGNOSIS — K922 Gastrointestinal hemorrhage, unspecified: Secondary | ICD-10-CM | POA: Insufficient documentation

## 2017-12-20 LAB — CBC WITH DIFFERENTIAL/PLATELET
BASOS ABS: 0 10*3/uL (ref 0.0–0.1)
BASOS PCT: 1 %
Eosinophils Absolute: 0.4 10*3/uL (ref 0.0–0.7)
Eosinophils Relative: 7 %
HCT: 32.7 % — ABNORMAL LOW (ref 36.0–46.0)
HEMOGLOBIN: 10.5 g/dL — AB (ref 12.0–15.0)
LYMPHS PCT: 24 %
Lymphs Abs: 1.4 10*3/uL (ref 0.7–4.0)
MCH: 28.8 pg (ref 26.0–34.0)
MCHC: 32.1 g/dL (ref 30.0–36.0)
MCV: 89.8 fL (ref 78.0–100.0)
MONO ABS: 0.3 10*3/uL (ref 0.1–1.0)
Monocytes Relative: 5 %
NEUTROS ABS: 3.7 10*3/uL (ref 1.7–7.7)
NEUTROS PCT: 63 %
PLATELETS: 249 10*3/uL (ref 150–400)
RBC: 3.64 MIL/uL — AB (ref 3.87–5.11)
RDW: 15.1 % (ref 11.5–15.5)
WBC: 5.9 10*3/uL (ref 4.0–10.5)

## 2017-12-20 MED ORDER — DARBEPOETIN ALFA 200 MCG/0.4ML IJ SOSY
200.0000 ug | PREFILLED_SYRINGE | Freq: Once | INTRAMUSCULAR | Status: AC
  Administered 2017-12-20: 200 ug via SUBCUTANEOUS
  Filled 2017-12-20: qty 0.4

## 2017-12-23 ENCOUNTER — Other Ambulatory Visit: Payer: Self-pay | Admitting: *Deleted

## 2017-12-23 ENCOUNTER — Encounter: Payer: Self-pay | Admitting: Cardiology

## 2017-12-23 MED ORDER — METOPROLOL TARTRATE 25 MG PO TABS: 12.5000 mg | ORAL_TABLET | Freq: Two times a day (BID) | ORAL | 0 refills | Status: DC

## 2017-12-23 MED ORDER — HYDRALAZINE HCL 25 MG PO TABS: 25.0000 mg | ORAL_TABLET | Freq: Three times a day (TID) | ORAL | 0 refills | Status: DC | PRN

## 2017-12-26 ENCOUNTER — Other Ambulatory Visit: Payer: Self-pay

## 2017-12-26 ENCOUNTER — Encounter (HOSPITAL_COMMUNITY): Payer: Self-pay

## 2017-12-26 NOTE — Progress Notes (Signed)
Late entry- on Friday July 26th patient received Aranesp as ordered. See MAR for details.  Patient tolerated it well without problems. Vitals stable and discharged home from clinic ambulatory. Follow up as scheduled.

## 2018-01-16 ENCOUNTER — Other Ambulatory Visit (HOSPITAL_COMMUNITY): Payer: Self-pay | Admitting: Internal Medicine

## 2018-01-16 ENCOUNTER — Ambulatory Visit: Payer: Medicare HMO | Admitting: Gastroenterology

## 2018-01-16 ENCOUNTER — Encounter: Payer: Self-pay | Admitting: Gastroenterology

## 2018-01-16 VITALS — BP 173/69 | HR 64 | Temp 97.0°F | Ht 64.0 in | Wt 180.2 lb

## 2018-01-16 DIAGNOSIS — D500 Iron deficiency anemia secondary to blood loss (chronic): Secondary | ICD-10-CM | POA: Diagnosis not present

## 2018-01-16 NOTE — Progress Notes (Signed)
Referring Provider: Caren Macadam, MD  Primary GI: Dr. Oneida Alar   Chief Complaint  Patient presents with  . Anemia    f/u. Doing well    HPI:   [name] is a 77 y.o. female presenting today with a history of IDA due to chronic blood loss, s/p multiple EGDs and capsule studies. 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    She is followed by Hematology closely. No overt GI bleeding. Feels much improved. No abdominal pain, N/V. Needs elective colonoscopy in the future as polyp remains, but she wants to hold off on this currently. Most recent Hgb 11.   Past Medical History:  Diagnosis Date  . Anemia   . Arthritis   . Blood transfusion without reported diagnosis   . Cataract   . Chronic anemia   . Chronic combined systolic and diastolic CHF (congestive heart failure) ([place])    a. 2D echo 08/2016 at [facility]: EF 50-55% with inferior wall HK, impaired LV filling, fair study.  . CKD (chronic kidney disease), stage III ([place])   . Diabetes ([place])   . Gastritis   . GERD (gastroesophageal reflux disease)   . Gout   . HTN (hypertension)   . Hyperlipidemia   . Hypothyroidism   . Iron deficiency anemia 11/08/2016  . Normocytic anemia 10/26/2016  . NSTEMI (non-ST elevated myocardial infarction) ([place])    a. Complex admission 08/2016 - with severe hyperglycemia, AKI on CKD, severe anemia down to Hgb 6.8, acute combined CHF, troponin of 8.5, cath deferred due to renal dysfunction.  . Thyroid disease     Past Surgical History:  Procedure Laterality Date  . COLONOSCOPY WITH PROPOFOL N/A 10/08/2016   5 mm transverse colon polyp note resected due to plavix. hemorrhoids  . ENTEROSCOPY N/A 08/08/2017   Procedure: ENTEROSCOPY;  Surgeon: Daneil Dolin, MD;  [name];  Service: Endoscopy;  Laterality: N/A;  . ESOPHAGOGASTRODUODENOSCOPY N/A 10/06/2016   mild chroni gastritis, negative H.pylori  .  ESOPHAGOGASTRODUODENOSCOPY (EGD) WITH PROPOFOL N/A 03/12/2017   mild edema/erythema of stomach, small bowel biopsy with focal villous tip lymphocytosis, ?partially developed celiac  . 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 GIVENS CAPSULE STUDY  10/08/2016   normal  . GIVENS CAPSULE STUDY N/A 10/29/2016   occasional gastric erosion, unremarkable small bowel  . ORIF HUMERUS FRACTURE Left 02/07/2015   Procedure: OPEN REDUCTION INTERNAL FIXATION (ORIF) PROXIMAL HUMERUS FRACTURE;  Surgeon: Marybelle Killings, MD;  Location: [place];  Service: Orthopedics;  Laterality: Left;  . THYROID SURGERY    . TOTAL HIP ARTHROPLASTY Left 02/07/2015   Procedure: TOTAL HIP ARTHROPLASTY ANTERIOR APPROACH ;  Surgeon: Marybelle Killings, MD;  Location: [place];  Service: Orthopedics;  Laterality: Left;  . WRIST SURGERY Left     Current Outpatient Medications  Medication Sig Dispense Refill  . acetaminophen (TYLENOL) 325 MG tablet Take 2 tablets (650 mg total) by mouth every 6 (six) hours as needed for mild pain or headache (or Fever >/= 101). 40 tablet 0  . amLODipine (NORVASC) 10 MG tablet Take 1 tablet (10 mg total) by mouth daily. 90 tablet 3  . docusate sodium (COLACE) 100 MG capsule Take 1 capsule (100 mg total) by mouth every  12 (twelve) hours. (Patient taking differently: Take 100 mg by mouth daily. ) 60 capsule 0  . furosemide (LASIX) 20 MG tablet Take 20 mg by mouth as needed. Only has to take it when she gains 2 lbs    . gabapentin (NEURONTIN) 300 MG capsule Take 2 capsules (600 mg total) by mouth at bedtime. 180 capsule 0  . glucose blood test strip 1 each by Other route 4 (four) times daily. Use as instructed bid. E11.65 400 each 2  . hydrALAZINE (APRESOLINE) 25 MG tablet Take 1 tablet (25 mg  total) by mouth every 8 (eight) hours as needed. 90 tablet 0  . Insulin Glargine (TOUJEO MAX SOLOSTAR) 300 UNIT/ML SOPN Inject 20 Units into the skin at bedtime. 3 pen 2  . levothyroxine (SYNTHROID, LEVOTHROID) 50 MCG tablet TAKE 1 TABLET BY MOUTH ONCE DAILY BEFORE  BREAKFAST 90 tablet 1  . metoprolol tartrate (LOPRESSOR) 25 MG tablet Take 0.5 tablets (12.5 mg total) by mouth 2 (two) times daily. 30 tablet 0  . Multiple Vitamin (MULTIVITAMIN WITH MINERALS) TABS tablet Take 1 tablet by mouth daily.    . ondansetron (ZOFRAN) 4 MG tablet Take 1 tablet (4 mg total) by mouth every 8 (eight) hours as needed for nausea or vomiting. 30 tablet 3  . pantoprazole (PROTONIX) 40 MG tablet Take 1 tablet (40 mg total) by mouth 2 (two) times daily. 30 minutes before breakfast 180 tablet 3  . ULORIC 40 MG tablet Take 1 tablet by mouth daily.  3   No current facility-administered medications for this visit.    Facility-Administered Medications Ordered in Other Visits  Medication Dose Route Frequency Provider Last Rate Last Dose  . Darbepoetin Alfa (ARANESP) injection 200 mcg  200 mcg Subcutaneous Once Higgs, Mathis Dad, MD        Allergies as of 01/16/2018 - Review Complete 01/16/2018  Allergen Reaction Noted  . Insulin glargine Swelling 09/18/2016  . Statins Other (See Comments) 09/18/2016    Family History  Problem Relation Age of Onset  . Diabetes Mother   . Asthma Mother   . Early death Mother 32       Pneumonia  . Early death Father        Killed at rodeo  . CAD Neg Hx   . GI Bleed Neg Hx     Social History   Socioeconomic History  . Marital status: Widowed    Spouse name: [name]  . Number of children: 2  . Years of education: 16  . Highest education level: Not on file  Occupational History  . Occupation: retired  Scientific laboratory technician  . Financial resource strain: Not on file  . Food insecurity:    Worry: Not on file    Inability: Not on file  . Transportation needs:    Medical: Not on file     Non-medical: Not on file  Tobacco Use  . Smoking status: Never Smoker  . Smokeless tobacco: Never Used  Substance and Sexual Activity  . Alcohol use: No    Alcohol/week: 0.0 standard drinks  . Drug use: No  . Sexual activity: Not Currently  Lifestyle  . Physical activity:    Days per week: Not on file    Minutes per session: Not on file  . Stress: Not on file  Relationships  . Social connections:    Talks on phone: Not on file    Gets together: Not on file    Attends religious service: Not on file  Active member of club or organization: Not on file    Attends meetings of clubs or organizations: Not on file    Relationship status: Not on file  Other Topics Concern  . Not on file  Social History Narrative   Lives with significant other/partner [name]   He is her caregiver   Moved from [facility]    Review of Systems: Gen: Denies fever, chills, anorexia. Denies fatigue, weakness, weight loss.  CV: Denies chest pain, palpitations, syncope, peripheral edema, and claudication. Resp: Denies dyspnea at rest, cough, wheezing, coughing up blood, and pleurisy. GI: see HPI  Derm: Denies rash, itching, dry skin Psych: Denies depression, anxiety, memory loss, confusion. No homicidal or suicidal ideation.  Heme: Denies bruising, bleeding, and enlarged lymph nodes.  Physical Exam: BP (!) 173/69   Pulse 64   Temp (!) 97 F (36.1 C) (Oral)   Ht 5\' 4"  (1.626 m)   Wt 180 lb 3.2 oz (81.7 kg)   BMI 30.93 kg/m  General:   Alert and oriented. No distress noted. Pleasant and cooperative.  Head:  Normocephalic and atraumatic. Eyes:  Conjuctiva clear without scleral icterus. Mouth:  Oral mucosa pink and moist.  Abdomen:  +BS, soft, non-tender and non-distended. No rebound or guarding. No HSM or masses noted. Msk:  Symmetrical without gross deformities. Normal posture. Extremities:  Without edema. Neurologic:  Alert and  oriented x4 Psych:  Alert and cooperative. Normal mood and affect.

## 2018-01-16 NOTE — Patient Instructions (Signed)
I am so glad you are doing well!  Please call if you need anything.  We can talk about a colonoscopy at the next visit in 6 months.  Enjoy your burger!  It was a pleasure to see you today. I strive to create trusting relationships with patients to provide genuine, compassionate, and quality care. I value your feedback. If you receive a survey regarding your visit,  I greatly appreciate you taking time to fill this out.   Annitta Needs, PhD, ANP-BC [facility]

## 2018-01-17 ENCOUNTER — Inpatient Hospital Stay (HOSPITAL_COMMUNITY): Payer: Medicare HMO

## 2018-01-17 ENCOUNTER — Inpatient Hospital Stay (HOSPITAL_COMMUNITY): Payer: Medicare HMO | Attending: Hematology

## 2018-01-17 VITALS — BP 166/56 | HR 68 | Temp 98.2°F | Resp 18

## 2018-01-17 DIAGNOSIS — N184 Chronic kidney disease, stage 4 (severe): Secondary | ICD-10-CM

## 2018-01-17 DIAGNOSIS — K922 Gastrointestinal hemorrhage, unspecified: Secondary | ICD-10-CM | POA: Insufficient documentation

## 2018-01-17 DIAGNOSIS — D631 Anemia in chronic kidney disease: Secondary | ICD-10-CM

## 2018-01-17 DIAGNOSIS — Z79899 Other long term (current) drug therapy: Secondary | ICD-10-CM | POA: Insufficient documentation

## 2018-01-17 DIAGNOSIS — D500 Iron deficiency anemia secondary to blood loss (chronic): Secondary | ICD-10-CM | POA: Diagnosis not present

## 2018-01-17 DIAGNOSIS — I130 Hypertensive heart and chronic kidney disease with heart failure and stage 1 through stage 4 chronic kidney disease, or unspecified chronic kidney disease: Secondary | ICD-10-CM | POA: Diagnosis not present

## 2018-01-17 LAB — CBC WITH DIFFERENTIAL/PLATELET
BASOS ABS: 0 10*3/uL (ref 0.0–0.1)
BASOS PCT: 1 %
EOS PCT: 5 %
Eosinophils Absolute: 0.4 10*3/uL (ref 0.0–0.7)
HCT: 34.6 % — ABNORMAL LOW (ref 36.0–46.0)
Hemoglobin: 11 g/dL — ABNORMAL LOW (ref 12.0–15.0)
LYMPHS PCT: 22 %
Lymphs Abs: 1.5 10*3/uL (ref 0.7–4.0)
MCH: 28.3 pg (ref 26.0–34.0)
MCHC: 31.8 g/dL (ref 30.0–36.0)
MCV: 88.9 fL (ref 78.0–100.0)
Monocytes Absolute: 0.4 10*3/uL (ref 0.1–1.0)
Monocytes Relative: 6 %
NEUTROS ABS: 4.6 10*3/uL (ref 1.7–7.7)
Neutrophils Relative %: 66 %
Platelets: 262 10*3/uL (ref 150–400)
RBC: 3.89 MIL/uL (ref 3.87–5.11)
RDW: 15.3 % (ref 11.5–15.5)
WBC: 7 10*3/uL (ref 4.0–10.5)

## 2018-01-17 MED ORDER — DARBEPOETIN ALFA 200 MCG/0.4ML IJ SOSY: 200.0000 ug | PREFILLED_SYRINGE | Freq: Once | INTRAMUSCULAR | Status: DC

## 2018-01-17 NOTE — Progress Notes (Signed)
Hgb 11 today. Dr. Walden Field made aware and Aranesp injection held per MD. Pt informed of this information and reminded of her appt for next month. Understanding verbalized

## 2018-01-20 ENCOUNTER — Encounter: Payer: Self-pay | Admitting: Gastroenterology

## 2018-01-20 NOTE — Assessment & Plan Note (Signed)
Continue to follow Hematology. Doing well s/p EGD/enteroscopy earlier this year, revealing Dieulafoy lesion. Continue to avoid NSAIDs, continue PPI daily. Return in 6 months to discuss elective colonoscopy as polyp was not resected last year.

## 2018-01-21 ENCOUNTER — Other Ambulatory Visit (HOSPITAL_COMMUNITY): Payer: Self-pay | Admitting: Pharmacist

## 2018-01-22 DIAGNOSIS — L989 Disorder of the skin and subcutaneous tissue, unspecified: Secondary | ICD-10-CM | POA: Diagnosis not present

## 2018-01-22 DIAGNOSIS — E119 Type 2 diabetes mellitus without complications: Secondary | ICD-10-CM | POA: Diagnosis not present

## 2018-01-22 DIAGNOSIS — I10 Essential (primary) hypertension: Secondary | ICD-10-CM | POA: Diagnosis not present

## 2018-01-22 DIAGNOSIS — L84 Corns and callosities: Secondary | ICD-10-CM | POA: Diagnosis not present

## 2018-01-24 ENCOUNTER — Other Ambulatory Visit (HOSPITAL_COMMUNITY): Payer: Self-pay | Admitting: Internal Medicine

## 2018-02-10 ENCOUNTER — Other Ambulatory Visit: Payer: Self-pay | Admitting: *Deleted

## 2018-02-10 MED ORDER — FUROSEMIDE 20 MG PO TABS: 20.0000 mg | ORAL_TABLET | ORAL | 1 refills | Status: DC | PRN

## 2018-02-11 DIAGNOSIS — Z0189 Encounter for other specified special examinations: Secondary | ICD-10-CM | POA: Diagnosis not present

## 2018-02-11 DIAGNOSIS — E1142 Type 2 diabetes mellitus with diabetic polyneuropathy: Secondary | ICD-10-CM | POA: Diagnosis not present

## 2018-02-11 DIAGNOSIS — L97511 Non-pressure chronic ulcer of other part of right foot limited to breakdown of skin: Secondary | ICD-10-CM | POA: Diagnosis not present

## 2018-02-11 DIAGNOSIS — M79671 Pain in right foot: Secondary | ICD-10-CM | POA: Diagnosis not present

## 2018-02-12 ENCOUNTER — Other Ambulatory Visit (HOSPITAL_COMMUNITY): Payer: Self-pay

## 2018-02-12 DIAGNOSIS — L8990 Pressure ulcer of unspecified site, unspecified stage: Secondary | ICD-10-CM | POA: Diagnosis not present

## 2018-02-12 DIAGNOSIS — D500 Iron deficiency anemia secondary to blood loss (chronic): Secondary | ICD-10-CM

## 2018-02-12 DIAGNOSIS — D631 Anemia in chronic kidney disease: Secondary | ICD-10-CM

## 2018-02-12 DIAGNOSIS — N184 Chronic kidney disease, stage 4 (severe): Secondary | ICD-10-CM

## 2018-02-14 ENCOUNTER — Encounter (HOSPITAL_COMMUNITY): Payer: Self-pay

## 2018-02-14 ENCOUNTER — Inpatient Hospital Stay (HOSPITAL_COMMUNITY): Payer: Medicare HMO | Attending: Hematology

## 2018-02-14 ENCOUNTER — Inpatient Hospital Stay (HOSPITAL_COMMUNITY): Payer: Medicare HMO

## 2018-02-14 ENCOUNTER — Other Ambulatory Visit: Payer: Self-pay

## 2018-02-14 VITALS — BP 153/51 | HR 67 | Temp 98.2°F | Resp 18

## 2018-02-14 DIAGNOSIS — D631 Anemia in chronic kidney disease: Secondary | ICD-10-CM

## 2018-02-14 DIAGNOSIS — D500 Iron deficiency anemia secondary to blood loss (chronic): Secondary | ICD-10-CM | POA: Diagnosis not present

## 2018-02-14 DIAGNOSIS — N184 Chronic kidney disease, stage 4 (severe): Secondary | ICD-10-CM

## 2018-02-14 DIAGNOSIS — I130 Hypertensive heart and chronic kidney disease with heart failure and stage 1 through stage 4 chronic kidney disease, or unspecified chronic kidney disease: Secondary | ICD-10-CM | POA: Insufficient documentation

## 2018-02-14 DIAGNOSIS — K922 Gastrointestinal hemorrhage, unspecified: Secondary | ICD-10-CM | POA: Insufficient documentation

## 2018-02-14 LAB — CBC WITH DIFFERENTIAL/PLATELET
BASOS PCT: 0 %
Basophils Absolute: 0 10*3/uL (ref 0.0–0.1)
EOS ABS: 0.5 10*3/uL (ref 0.0–0.7)
Eosinophils Relative: 6 %
HEMATOCRIT: 32.3 % — AB (ref 36.0–46.0)
HEMOGLOBIN: 10.6 g/dL — AB (ref 12.0–15.0)
Lymphocytes Relative: 20 %
Lymphs Abs: 1.4 10*3/uL (ref 0.7–4.0)
MCH: 29 pg (ref 26.0–34.0)
MCHC: 32.8 g/dL (ref 30.0–36.0)
MCV: 88.5 fL (ref 78.0–100.0)
MONOS PCT: 8 %
Monocytes Absolute: 0.6 10*3/uL (ref 0.1–1.0)
NEUTROS ABS: 4.7 10*3/uL (ref 1.7–7.7)
NEUTROS PCT: 66 %
Platelets: 253 10*3/uL (ref 150–400)
RBC: 3.65 MIL/uL — AB (ref 3.87–5.11)
RDW: 15.6 % — ABNORMAL HIGH (ref 11.5–15.5)
WBC: 7.2 10*3/uL (ref 4.0–10.5)

## 2018-02-14 MED ORDER — EPOETIN ALFA-EPBX 40000 UNIT/ML IJ SOLN
40000.0000 [IU] | Freq: Once | INTRAMUSCULAR | Status: AC
  Administered 2018-02-14: 40000 [IU] via SUBCUTANEOUS
  Filled 2018-02-14: qty 1

## 2018-02-14 NOTE — Patient Instructions (Signed)
[facility] at [facility] Discharge Instructions     Thank you for choosing [facility] at [facility] to provide your oncology and hematology care.  To afford each patient quality time with our provider, please arrive at least 15 minutes before your scheduled appointment time.   If you have a lab appointment with the Cancer Center please come in thru the  Main Entrance and check in at the main information desk  You need to re-schedule your appointment should you arrive 10 or more minutes late.  We strive to give you quality time with our providers, and arriving late affects you and other patients whose appointments are after yours.  Also, if you no show three or more times for appointments you may be dismissed from the clinic at the providers discretion.     Again, thank you for choosing [facility].  Our hope is that these requests will decrease the amount of time that you wait before being seen by our physicians.       _____________________________________________________________  Should you have questions after your visit to [facility], please contact our office at [phone] between the hours of 8:00 a.m. and 4:30 p.m.  Voicemails left after 4:00 p.m. will not be returned until the following business day.  For prescription refill requests, have your pharmacy contact our office and allow 72 hours.    Cancer Center Support Programs:   > Cancer Support Group  2nd Tuesday of the month 1pm-2pm, Journey Room    

## 2018-02-14 NOTE — Progress Notes (Signed)
Patient is to receive Retacrit  40,000 units every 2 weeks now due insurance reasons. Durene Cal Facilities manager verifed authorization and Dr. Walden Field verified orders.    Patient tolerated it well without problems. Vitals stable and discharged home from clinic ambulatory. Follow up as scheduled.

## 2018-02-18 ENCOUNTER — Ambulatory Visit: Payer: Medicare HMO | Admitting: Cardiology

## 2018-02-18 ENCOUNTER — Encounter: Payer: Self-pay | Admitting: Cardiology

## 2018-02-18 VITALS — BP 183/73 | HR 60 | Ht 64.0 in | Wt 178.2 lb

## 2018-02-18 DIAGNOSIS — I10 Essential (primary) hypertension: Secondary | ICD-10-CM

## 2018-02-18 DIAGNOSIS — I2510 Atherosclerotic heart disease of native coronary artery without angina pectoris: Secondary | ICD-10-CM | POA: Diagnosis not present

## 2018-02-18 DIAGNOSIS — R0989 Other specified symptoms and signs involving the circulatory and respiratory systems: Secondary | ICD-10-CM | POA: Diagnosis not present

## 2018-02-18 MED ORDER — HYDRALAZINE HCL 25 MG PO TABS: 25.0000 mg | ORAL_TABLET | Freq: Two times a day (BID) | ORAL | 1 refills | Status: DC

## 2018-02-18 NOTE — Patient Instructions (Addendum)
Your physician wants you to follow-up in: [place] will receive a reminder letter in the mail two months in advance. If you don't receive a letter, please call our office to schedule the follow-up appointment.  Your physician has recommended you make the following change in your medication:   CHANGE HYDRALAZINE 25 [place] physician has requested that you regularly monitor and record your blood pressure readings at home FOR 1 [place]. Please use the same machine at the same time of day to check your readings and record them  Your physician has requested that you have a carotid duplex. This test is an ultrasound of the carotid arteries in your neck. It looks at blood flow through these arteries that supply the brain with blood. Allow one hour for this exam. There are no restrictions or special instructions.  Thank you for choosing [facility]!!

## 2018-02-18 NOTE — Progress Notes (Signed)
Clinical Summary Ms. Luepke is a 77 y.o.female seen today for follow up of the following medical problems.   1. NSTEMI - 2 admissions over the last few months with elevated troponins in setting of severe anemia - her initial NSTEMI admission was at [facility] was treated medically including with DAPT, primarily due to advanced renal dysfunction and anemia. Peak troponin 8.5.  - readmitted 10/2016 with Hgb of 4.4 , elevated troponin again. DAPT stopped. Peak trop 1.8. 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 Her renal function and recurrent anemia has prohibited ischemic testing. She has statin allergy, no ACEI/ARB due to poor renal function  - weight down 5 lbs since 10/25/16 visit - no recent chest pain, no SOB. Occasional LE edema.  - 08/2016 echo at [facility]: EF 50-55% with inferior wall HK, impaired LV filling, fair study.  - no recent symptoms.   07/2017 admit with dark stools and anemia, off DAPT - no recent chest pain.   2. Anemia/GI bleed - followed by GI and heme/onc. SHe is to start procrit.  - received 4 units of blood during recent admission.   - no recent bleeding.    3.HTN - home SBP 150s. Appears she is only taking her hydrlazine 25mg  once daily    Past Medical History:  Diagnosis Date  . Anemia   . Arthritis   . Blood transfusion without reported diagnosis   . Cataract   . Chronic anemia   . Chronic combined systolic and diastolic CHF (congestive heart failure) ([place])    a. 2D echo 08/2016 at [facility]: EF 50-55% with inferior wall HK, impaired LV filling, fair study.  . CKD (chronic kidney disease), stage III ([place])   . Diabetes ([place])   . Gastritis   . GERD (gastroesophageal reflux disease)   . Gout   . HTN (hypertension)   . Hyperlipidemia   . Hypothyroidism   . Iron deficiency anemia 11/08/2016  . Normocytic anemia 10/26/2016  . NSTEMI (non-ST elevated  myocardial infarction) ([place])    a. Complex admission 08/2016 - with severe hyperglycemia, AKI on CKD, severe anemia down to Hgb 6.8, acute combined CHF, troponin of 8.5, cath deferred due to renal dysfunction.  . Thyroid disease      Allergies  Allergen Reactions  . Insulin Glargine Swelling    "Makes me swell like a balloon all over", including face, but without any respiratory distress or rashes. Associated with weight gain.  . Statins Other (See Comments)    "I've tried them all; my muscle aches were so bad I couldn't walk".     Current Outpatient Medications  Medication Sig Dispense Refill  . acetaminophen (TYLENOL) 325 MG tablet Take 2 tablets (650 mg total) by mouth every 6 (six) hours as needed for mild pain or headache (or Fever >/= 101). 40 tablet 0  . amLODipine (NORVASC) 10 MG tablet Take 1 tablet (10 mg total) by mouth daily. 90 tablet 3  . docusate sodium (COLACE) 100 MG capsule Take 1 capsule (100 mg total) by mouth every 12 (twelve) hours. (Patient taking differently: Take 100 mg by mouth daily. ) 60 capsule 0  . furosemide (LASIX) 20 MG tablet Take 1 tablet (20 mg total) by mouth as needed. Only has to take it when she gains 2 lbs 30 tablet 1  . gabapentin (NEURONTIN) 300 MG capsule Take 2 capsules (600 mg total)  by mouth at bedtime. 180 capsule 0  . glucose blood test strip 1 each by Other route 4 (four) times daily. Use as instructed bid. E11.65 400 each 2  . hydrALAZINE (APRESOLINE) 25 MG tablet Take 1 tablet (25 mg total) by mouth every 8 (eight) hours as needed. 90 tablet 0  . Insulin Glargine (TOUJEO MAX SOLOSTAR) 300 UNIT/ML SOPN Inject 20 Units into the skin at bedtime. 3 pen 2  . levothyroxine (SYNTHROID, LEVOTHROID) 50 MCG tablet TAKE 1 TABLET BY MOUTH ONCE DAILY BEFORE  BREAKFAST 90 tablet 1  . metoprolol tartrate (LOPRESSOR) 25 MG tablet Take 0.5 tablets (12.5 mg total) by mouth 2 (two) times daily. 30 tablet 0  . Multiple Vitamin (MULTIVITAMIN WITH MINERALS)  TABS tablet Take 1 tablet by mouth daily.    . ondansetron (ZOFRAN) 4 MG tablet Take 1 tablet (4 mg total) by mouth every 8 (eight) hours as needed for nausea or vomiting. 30 tablet 3  . pantoprazole (PROTONIX) 40 MG tablet Take 1 tablet (40 mg total) by mouth 2 (two) times daily. 30 minutes before breakfast 180 tablet 3  . ULORIC 40 MG tablet Take 1 tablet by mouth daily.  3   No current facility-administered medications for this visit.    Facility-Administered Medications Ordered in Other Visits  Medication Dose Route Frequency Provider Last Rate Last Dose  . Darbepoetin Alfa (ARANESP) injection 200 mcg  200 mcg Subcutaneous Once Higgs, Mathis Dad, MD         Past Surgical History:  Procedure Laterality Date  . COLONOSCOPY WITH PROPOFOL N/A 10/08/2016   5 mm transverse colon polyp note resected due to plavix. hemorrhoids  . ENTEROSCOPY N/A 08/08/2017   Procedure: ENTEROSCOPY;  Surgeon: Daneil Dolin, MD;  [name];  Service: Endoscopy;  Laterality: N/A;  . ESOPHAGOGASTRODUODENOSCOPY N/A 10/06/2016   mild chroni gastritis, negative H.pylori  . ESOPHAGOGASTRODUODENOSCOPY (EGD) WITH PROPOFOL N/A 03/12/2017   mild edema/erythema of stomach, small bowel biopsy with focal villous tip lymphocytosis, ?partially developed celiac  . 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 GIVENS CAPSULE STUDY  10/08/2016   normal  . GIVENS CAPSULE STUDY N/A 10/29/2016   occasional gastric erosion, unremarkable small bowel  . ORIF HUMERUS FRACTURE Left 02/07/2015   Procedure: OPEN REDUCTION INTERNAL FIXATION (ORIF) PROXIMAL HUMERUS FRACTURE;  Surgeon: Marybelle Killings, MD;  Location: [place];  Service: Orthopedics;  Laterality: Left;  . THYROID SURGERY    . TOTAL HIP ARTHROPLASTY Left  02/07/2015   Procedure: TOTAL HIP ARTHROPLASTY ANTERIOR APPROACH ;  Surgeon: Marybelle Killings, MD;  Location: [facility];  Service: Orthopedics;  Laterality: Left;  . WRIST SURGERY Left      Allergies  Allergen Reactions  . Insulin Glargine Swelling    "Makes me swell like a balloon all over", including face, but without any respiratory distress or rashes. Associated with weight gain.  . Statins Other (See Comments)    "I've tried them all; my muscle aches were so bad I couldn't walk".      Family History  Problem Relation Age of Onset  . Diabetes Mother   . Asthma Mother   . Early death Mother 77       Pneumonia  . Early death Father        Killed at rodeo  .  CAD Neg Hx   . GI Bleed Neg Hx      Social History Ms. Aguallo reports that she has never smoked. She has never used smokeless tobacco. Ms. Crossland reports that she does not drink alcohol.   Review of Systems CONSTITUTIONAL: No weight loss, fever, chills, weakness or fatigue.  HEENT: Eyes: No visual loss, blurred vision, double vision or yellow sclerae.No hearing loss, sneezing, congestion, runny nose or sore throat.  SKIN: No rash or itching.  CARDIOVASCULAR: per hpi RESPIRATORY: No shortness of breath, cough or sputum.  GASTROINTESTINAL: No anorexia, nausea, vomiting or diarrhea. No abdominal pain or blood.  GENITOURINARY: No burning on urination, no polyuria NEUROLOGICAL: No headache, dizziness, syncope, paralysis, ataxia, numbness or tingling in the extremities. No change in bowel or bladder control.  MUSCULOSKELETAL: No muscle, back pain, joint pain or stiffness.  LYMPHATICS: No enlarged nodes. No history of splenectomy.  PSYCHIATRIC: No history of depression or anxiety.  ENDOCRINOLOGIC: No reports of sweating, cold or heat intolerance. No polyuria or polydipsia.  Marland Kitchen   Physical Examination Vitals:   02/18/18 1336  BP: (!) 183/73  Pulse: 60  SpO2: 98%   Vitals:   02/18/18 1336  Weight: 178 lb 3.2 oz (80.8 kg)   Height: 5\' 4"  (1.626 m)    Gen: resting comfortably, no acute distress HEENT: no scleral icterus, pupils equal round and reactive, no palptable cervical adenopathy,  CV: RRR, 2/6 systolic murmur rusb, right carotid bruit Resp: Clear to auscultation bilaterally GI: abdomen is soft, non-tender, non-distended, normal bowel sounds, no hepatosplenomegaly MSK: extremities are warm, no edema.  Skin: warm, no rash Neuro:  no focal deficits Psych: appropriate affect     Assessment and Plan  1. Presumed CAD  - multiple admissions with elevated troponins in setting of severe anemia or volume overload. She has not had caridac chest pain during these admissions.  - she has not had ischemic testing due to her advanced kidney failure, and chronic anemia/GI bleed with inability to tolerate DAPT - no recent symptoms, continue current meds. Intolerant to statins, f/u pcp labs to see if alternative is indicated  - if Hgb remains stable, would ask GI if could try low dose aspirin  2. HTN - elevated in clinic, only taking hydralazine 25mg  once daily. We will change to once daily, ask for bp log in 1 week, titrate hydralazine as needed.   3. Carotid bruits - order carotid US.       Arnoldo Lenis, M.D.

## 2018-02-20 DIAGNOSIS — L97521 Non-pressure chronic ulcer of other part of left foot limited to breakdown of skin: Secondary | ICD-10-CM | POA: Diagnosis not present

## 2018-02-28 ENCOUNTER — Inpatient Hospital Stay (HOSPITAL_COMMUNITY): Payer: Medicare HMO | Attending: Hematology

## 2018-02-28 ENCOUNTER — Inpatient Hospital Stay (HOSPITAL_COMMUNITY): Payer: Medicare HMO

## 2018-02-28 ENCOUNTER — Encounter (HOSPITAL_COMMUNITY): Payer: Self-pay

## 2018-02-28 VITALS — BP 170/52 | HR 56 | Temp 97.5°F | Resp 18 | Wt 176.6 lb

## 2018-02-28 DIAGNOSIS — D500 Iron deficiency anemia secondary to blood loss (chronic): Secondary | ICD-10-CM

## 2018-02-28 DIAGNOSIS — D631 Anemia in chronic kidney disease: Secondary | ICD-10-CM | POA: Diagnosis not present

## 2018-02-28 DIAGNOSIS — N183 Chronic kidney disease, stage 3 (moderate): Secondary | ICD-10-CM | POA: Diagnosis not present

## 2018-02-28 DIAGNOSIS — N184 Chronic kidney disease, stage 4 (severe): Secondary | ICD-10-CM

## 2018-02-28 LAB — CBC WITH DIFFERENTIAL/PLATELET
BASOS ABS: 0.1 10*3/uL (ref 0.0–0.1)
BASOS PCT: 1 %
EOS PCT: 5 %
Eosinophils Absolute: 0.3 10*3/uL (ref 0.0–0.7)
HEMATOCRIT: 33.4 % — AB (ref 36.0–46.0)
Hemoglobin: 10.8 g/dL — ABNORMAL LOW (ref 12.0–15.0)
Lymphocytes Relative: 21 %
Lymphs Abs: 1.3 10*3/uL (ref 0.7–4.0)
MCH: 29.2 pg (ref 26.0–34.0)
MCHC: 32.3 g/dL (ref 30.0–36.0)
MCV: 90.3 fL (ref 78.0–100.0)
MONO ABS: 0.4 10*3/uL (ref 0.1–1.0)
Monocytes Relative: 6 %
NEUTROS ABS: 4.3 10*3/uL (ref 1.7–7.7)
Neutrophils Relative %: 67 %
PLATELETS: 256 10*3/uL (ref 150–400)
RBC: 3.7 MIL/uL — ABNORMAL LOW (ref 3.87–5.11)
RDW: 16.6 % — AB (ref 11.5–15.5)
WBC: 6.4 10*3/uL (ref 4.0–10.5)

## 2018-02-28 MED ORDER — EPOETIN ALFA-EPBX 40000 UNIT/ML IJ SOLN
40000.0000 [IU] | Freq: Once | INTRAMUSCULAR | Status: AC
  Administered 2018-02-28: 40000 [IU] via SUBCUTANEOUS
  Filled 2018-02-28: qty 1

## 2018-02-28 NOTE — Progress Notes (Signed)
[name] tolerated Retacrit injection today Hgb 10.8 today. VSS Pt discharged self ambulatory in satisfactory condition accompanied by her husband

## 2018-02-28 NOTE — Patient Instructions (Signed)
[place] at [facility] Discharge Instructions  Received Retacrit injection today. Follow-up as scheduled. Call clinic for any questions or concerns   Thank you for choosing [place] at [facility] to provide your oncology and hematology care.  To afford each patient quality time with our provider, please arrive at least 15 minutes before your scheduled appointment time.   If you have a lab appointment with the [facility] please come in thru the  Main Entrance and check in at the main information desk  You need to re-schedule your appointment should you arrive 10 or more minutes late.  We strive to give you quality time with our providers, and arriving late affects you and other patients whose appointments are after yours.  Also, if you no show three or more times for appointments you may be dismissed from the clinic at the providers discretion.     Again, thank you for choosing [facility].  Our hope is that these requests will decrease the amount of time that you wait before being seen by our physicians.       _____________________________________________________________  Should you have questions after your visit to [facility], please contact our office at [phone] between the hours of 8:00 a.m. and 4:30 p.m.  Voicemails left after 4:00 p.m. will not be returned until the following business day.  For prescription refill requests, have your pharmacy contact our office and allow 72 hours.    Cancer Center Support Programs:   > Cancer Support Group  2nd Tuesday of the month 1pm-2pm, Journey Room

## 2018-03-07 ENCOUNTER — Telehealth: Payer: Self-pay | Admitting: *Deleted

## 2018-03-07 NOTE — Telephone Encounter (Signed)
Pt voiced understanding

## 2018-03-07 NOTE — Telephone Encounter (Signed)
-----   Message from Arnoldo Lenis, MD sent at 03/07/2018 12:38 PM EDT ----- BP log reviewed, bp's are ok. Top numbers are a little elevated at times but bottom numbers are on the lower end, I would not want to increase any meds at this time   Zandra Abts MD

## 2018-03-13 DIAGNOSIS — L97521 Non-pressure chronic ulcer of other part of left foot limited to breakdown of skin: Secondary | ICD-10-CM | POA: Diagnosis not present

## 2018-03-14 ENCOUNTER — Inpatient Hospital Stay (HOSPITAL_COMMUNITY): Payer: Medicare HMO

## 2018-03-14 DIAGNOSIS — N183 Chronic kidney disease, stage 3 (moderate): Secondary | ICD-10-CM | POA: Diagnosis not present

## 2018-03-14 DIAGNOSIS — D631 Anemia in chronic kidney disease: Secondary | ICD-10-CM | POA: Diagnosis not present

## 2018-03-14 DIAGNOSIS — D500 Iron deficiency anemia secondary to blood loss (chronic): Secondary | ICD-10-CM

## 2018-03-14 LAB — CBC WITH DIFFERENTIAL/PLATELET
Abs Immature Granulocytes: 0.01 10*3/uL (ref 0.00–0.07)
Basophils Absolute: 0.1 10*3/uL (ref 0.0–0.1)
Basophils Relative: 1 %
EOS ABS: 0.8 10*3/uL — AB (ref 0.0–0.5)
Eosinophils Relative: 12 %
HEMATOCRIT: 38.7 % (ref 36.0–46.0)
HEMOGLOBIN: 11.8 g/dL — AB (ref 12.0–15.0)
Immature Granulocytes: 0 %
LYMPHS ABS: 1.3 10*3/uL (ref 0.7–4.0)
LYMPHS PCT: 19 %
MCH: 28 pg (ref 26.0–34.0)
MCHC: 30.5 g/dL (ref 30.0–36.0)
MCV: 91.7 fL (ref 80.0–100.0)
MONO ABS: 0.5 10*3/uL (ref 0.1–1.0)
Monocytes Relative: 7 %
NRBC: 0 % (ref 0.0–0.2)
Neutro Abs: 4.5 10*3/uL (ref 1.7–7.7)
Neutrophils Relative %: 61 %
Platelets: 244 10*3/uL (ref 150–400)
RBC: 4.22 MIL/uL (ref 3.87–5.11)
RDW: 16.1 % — ABNORMAL HIGH (ref 11.5–15.5)
WBC: 7.2 10*3/uL (ref 4.0–10.5)

## 2018-03-14 NOTE — Progress Notes (Signed)
Pt here for Retacrit supportive therapy. Labs today. Hgb 11.7. Hold injection today. F/U as scheduled. Appt schedule printed off and given to patient.

## 2018-03-25 ENCOUNTER — Other Ambulatory Visit (HOSPITAL_COMMUNITY): Payer: Self-pay

## 2018-03-25 DIAGNOSIS — N184 Chronic kidney disease, stage 4 (severe): Secondary | ICD-10-CM

## 2018-03-25 DIAGNOSIS — D631 Anemia in chronic kidney disease: Secondary | ICD-10-CM

## 2018-03-27 ENCOUNTER — Other Ambulatory Visit (HOSPITAL_COMMUNITY): Payer: Self-pay | Admitting: *Deleted

## 2018-03-27 DIAGNOSIS — D631 Anemia in chronic kidney disease: Secondary | ICD-10-CM

## 2018-03-27 DIAGNOSIS — N184 Chronic kidney disease, stage 4 (severe): Principal | ICD-10-CM

## 2018-03-27 NOTE — Progress Notes (Signed)
Orders received from Dr. Florentina Addison office for renal panel, hematocrit, hemoglobin, iron and tibc, ferritin, PTH intact, Vitamin D, Urine protein/creatinine ration. Labs will be drawn and then faxed to his office tomorrow.

## 2018-03-28 ENCOUNTER — Inpatient Hospital Stay (HOSPITAL_COMMUNITY): Payer: Medicare HMO

## 2018-03-28 ENCOUNTER — Inpatient Hospital Stay (HOSPITAL_COMMUNITY): Payer: Medicare HMO | Attending: Internal Medicine

## 2018-03-28 DIAGNOSIS — Z7902 Long term (current) use of antithrombotics/antiplatelets: Secondary | ICD-10-CM | POA: Diagnosis not present

## 2018-03-28 DIAGNOSIS — D631 Anemia in chronic kidney disease: Secondary | ICD-10-CM

## 2018-03-28 DIAGNOSIS — I10 Essential (primary) hypertension: Secondary | ICD-10-CM | POA: Diagnosis not present

## 2018-03-28 DIAGNOSIS — N183 Chronic kidney disease, stage 3 (moderate): Secondary | ICD-10-CM | POA: Diagnosis not present

## 2018-03-28 DIAGNOSIS — Z794 Long term (current) use of insulin: Secondary | ICD-10-CM | POA: Diagnosis not present

## 2018-03-28 DIAGNOSIS — N184 Chronic kidney disease, stage 4 (severe): Principal | ICD-10-CM

## 2018-03-28 DIAGNOSIS — E119 Type 2 diabetes mellitus without complications: Secondary | ICD-10-CM | POA: Insufficient documentation

## 2018-03-28 LAB — COMPREHENSIVE METABOLIC PANEL
ALT: 10 U/L (ref 0–44)
ANION GAP: 8 (ref 5–15)
AST: 15 U/L (ref 15–41)
Albumin: 3.6 g/dL (ref 3.5–5.0)
Alkaline Phosphatase: 89 U/L (ref 38–126)
BUN: 47 mg/dL — AB (ref 8–23)
CHLORIDE: 112 mmol/L — AB (ref 98–111)
CO2: 20 mmol/L — ABNORMAL LOW (ref 22–32)
Calcium: 8.6 mg/dL — ABNORMAL LOW (ref 8.9–10.3)
Creatinine, Ser: 2.5 mg/dL — ABNORMAL HIGH (ref 0.44–1.00)
GFR calc Af Amer: 20 mL/min — ABNORMAL LOW (ref >60)
GFR, EST NON AFRICAN AMERICAN: 17 mL/min — AB (ref >60)
Glucose, Bld: 142 mg/dL — ABNORMAL HIGH (ref 70–99)
POTASSIUM: 4.5 mmol/L (ref 3.5–5.1)
Sodium: 140 mmol/L (ref 135–145)
Total Bilirubin: 0.8 mg/dL (ref 0.3–1.2)
Total Protein: 7.1 g/dL (ref 6.5–8.1)

## 2018-03-28 LAB — CBC WITH DIFFERENTIAL/PLATELET
Abs Immature Granulocytes: 0.01 10*3/uL (ref 0.00–0.07)
Basophils Absolute: 0.1 10*3/uL (ref 0.0–0.1)
Basophils Relative: 1 %
Eosinophils Absolute: 1.1 10*3/uL — ABNORMAL HIGH (ref 0.0–0.5)
Eosinophils Relative: 15 %
HEMATOCRIT: 36.8 % (ref 36.0–46.0)
HEMOGLOBIN: 11.6 g/dL — AB (ref 12.0–15.0)
Immature Granulocytes: 0 %
LYMPHS ABS: 1.1 10*3/uL (ref 0.7–4.0)
LYMPHS PCT: 16 %
MCH: 28.4 pg (ref 26.0–34.0)
MCHC: 31.5 g/dL (ref 30.0–36.0)
MCV: 90.2 fL (ref 80.0–100.0)
MONO ABS: 0.4 10*3/uL (ref 0.1–1.0)
MONOS PCT: 6 %
Neutro Abs: 4.4 10*3/uL (ref 1.7–7.7)
Neutrophils Relative %: 62 %
Platelets: 272 10*3/uL (ref 150–400)
RBC: 4.08 MIL/uL (ref 3.87–5.11)
RDW: 15.2 % (ref 11.5–15.5)
WBC: 7.1 10*3/uL (ref 4.0–10.5)
nRBC: 0 % (ref 0.0–0.2)

## 2018-03-28 LAB — IRON AND TIBC
IRON: 53 ug/dL (ref 28–170)
SATURATION RATIOS: 20 % (ref 10.4–31.8)
TIBC: 268 ug/dL (ref 250–450)
UIBC: 215 ug/dL

## 2018-03-28 LAB — FERRITIN: FERRITIN: 175 ng/mL (ref 11–307)

## 2018-03-28 NOTE — Progress Notes (Signed)
Procrit held today d/t hemoglobin 11.6.

## 2018-03-29 LAB — PTH, INTACT AND CALCIUM
CALCIUM TOTAL (PTH): 8.6 mg/dL — AB (ref 8.7–10.3)
PTH: 106 pg/mL — AB (ref 15–65)

## 2018-03-29 LAB — VITAMIN D 25 HYDROXY (VIT D DEFICIENCY, FRACTURES): Vit D, 25-Hydroxy: 16.9 ng/mL — ABNORMAL LOW (ref 30.0–100.0)

## 2018-04-01 DIAGNOSIS — D638 Anemia in other chronic diseases classified elsewhere: Secondary | ICD-10-CM | POA: Diagnosis not present

## 2018-04-01 DIAGNOSIS — M109 Gout, unspecified: Secondary | ICD-10-CM | POA: Diagnosis not present

## 2018-04-01 DIAGNOSIS — E559 Vitamin D deficiency, unspecified: Secondary | ICD-10-CM | POA: Diagnosis not present

## 2018-04-01 DIAGNOSIS — N184 Chronic kidney disease, stage 4 (severe): Secondary | ICD-10-CM | POA: Diagnosis not present

## 2018-04-01 DIAGNOSIS — E1129 Type 2 diabetes mellitus with other diabetic kidney complication: Secondary | ICD-10-CM | POA: Diagnosis not present

## 2018-04-01 DIAGNOSIS — I10 Essential (primary) hypertension: Secondary | ICD-10-CM | POA: Diagnosis not present

## 2018-04-01 DIAGNOSIS — R809 Proteinuria, unspecified: Secondary | ICD-10-CM | POA: Diagnosis not present

## 2018-04-02 ENCOUNTER — Other Ambulatory Visit: Payer: Self-pay | Admitting: Cardiology

## 2018-04-02 ENCOUNTER — Ambulatory Visit (INDEPENDENT_AMBULATORY_CARE_PROVIDER_SITE_OTHER): Payer: Medicare HMO

## 2018-04-02 DIAGNOSIS — R0989 Other specified symptoms and signs involving the circulatory and respiratory systems: Secondary | ICD-10-CM | POA: Diagnosis not present

## 2018-04-02 MED ORDER — AMLODIPINE BESYLATE 10 MG PO TABS: 10.0000 mg | ORAL_TABLET | Freq: Every day | ORAL | 3 refills | Status: DC

## 2018-04-02 NOTE — Telephone Encounter (Signed)
°*  STAT* If patient is at the pharmacy, call can be transferred to refill team.   1. Which medications need to be refilled?   amLODipine (NORVASC) 10 MG tablet    2. Which pharmacy/location (including street and city if local pharmacy) is medication to be sent to? [name]  3. Do they need a 30 day or 90 day supply?

## 2018-04-03 DIAGNOSIS — L97521 Non-pressure chronic ulcer of other part of left foot limited to breakdown of skin: Secondary | ICD-10-CM | POA: Diagnosis not present

## 2018-04-07 DIAGNOSIS — E119 Type 2 diabetes mellitus without complications: Secondary | ICD-10-CM | POA: Diagnosis not present

## 2018-04-07 DIAGNOSIS — I10 Essential (primary) hypertension: Secondary | ICD-10-CM | POA: Diagnosis not present

## 2018-04-07 DIAGNOSIS — E039 Hypothyroidism, unspecified: Secondary | ICD-10-CM | POA: Diagnosis not present

## 2018-04-11 ENCOUNTER — Inpatient Hospital Stay (HOSPITAL_COMMUNITY): Payer: Medicare HMO

## 2018-04-11 DIAGNOSIS — D631 Anemia in chronic kidney disease: Secondary | ICD-10-CM

## 2018-04-11 DIAGNOSIS — I10 Essential (primary) hypertension: Secondary | ICD-10-CM | POA: Diagnosis not present

## 2018-04-11 DIAGNOSIS — N183 Chronic kidney disease, stage 3 (moderate): Secondary | ICD-10-CM | POA: Diagnosis not present

## 2018-04-11 DIAGNOSIS — N184 Chronic kidney disease, stage 4 (severe): Secondary | ICD-10-CM

## 2018-04-11 DIAGNOSIS — Z7902 Long term (current) use of antithrombotics/antiplatelets: Secondary | ICD-10-CM | POA: Diagnosis not present

## 2018-04-11 DIAGNOSIS — Z794 Long term (current) use of insulin: Secondary | ICD-10-CM | POA: Diagnosis not present

## 2018-04-11 DIAGNOSIS — E119 Type 2 diabetes mellitus without complications: Secondary | ICD-10-CM | POA: Diagnosis not present

## 2018-04-11 LAB — CBC
HEMATOCRIT: 36.8 % (ref 36.0–46.0)
HEMOGLOBIN: 11.3 g/dL — AB (ref 12.0–15.0)
MCH: 28.2 pg (ref 26.0–34.0)
MCHC: 30.7 g/dL (ref 30.0–36.0)
MCV: 91.8 fL (ref 80.0–100.0)
Platelets: 274 10*3/uL (ref 150–400)
RBC: 4.01 MIL/uL (ref 3.87–5.11)
RDW: 15.1 % (ref 11.5–15.5)
WBC: 7.3 10*3/uL (ref 4.0–10.5)
nRBC: 0 % (ref 0.0–0.2)

## 2018-04-14 ENCOUNTER — Ambulatory Visit (HOSPITAL_COMMUNITY): Payer: Medicare HMO

## 2018-04-14 ENCOUNTER — Encounter (HOSPITAL_COMMUNITY): Payer: Self-pay | Admitting: Hematology

## 2018-04-14 ENCOUNTER — Other Ambulatory Visit: Payer: Self-pay

## 2018-04-14 ENCOUNTER — Inpatient Hospital Stay (HOSPITAL_COMMUNITY): Payer: Medicare HMO

## 2018-04-14 ENCOUNTER — Inpatient Hospital Stay (HOSPITAL_BASED_OUTPATIENT_CLINIC_OR_DEPARTMENT_OTHER): Payer: Medicare HMO | Admitting: Hematology

## 2018-04-14 ENCOUNTER — Other Ambulatory Visit (HOSPITAL_COMMUNITY): Payer: Medicare HMO | Admitting: Hematology

## 2018-04-14 ENCOUNTER — Telehealth: Payer: Self-pay | Admitting: *Deleted

## 2018-04-14 ENCOUNTER — Ambulatory Visit (HOSPITAL_COMMUNITY): Payer: Medicare HMO | Admitting: Hematology

## 2018-04-14 DIAGNOSIS — Z79899 Other long term (current) drug therapy: Secondary | ICD-10-CM | POA: Diagnosis not present

## 2018-04-14 DIAGNOSIS — I10 Essential (primary) hypertension: Secondary | ICD-10-CM

## 2018-04-14 DIAGNOSIS — N183 Chronic kidney disease, stage 3 (moderate): Secondary | ICD-10-CM

## 2018-04-14 DIAGNOSIS — D631 Anemia in chronic kidney disease: Secondary | ICD-10-CM | POA: Diagnosis not present

## 2018-04-14 DIAGNOSIS — E119 Type 2 diabetes mellitus without complications: Secondary | ICD-10-CM

## 2018-04-14 DIAGNOSIS — Z794 Long term (current) use of insulin: Secondary | ICD-10-CM

## 2018-04-14 DIAGNOSIS — Z7902 Long term (current) use of antithrombotics/antiplatelets: Secondary | ICD-10-CM | POA: Diagnosis not present

## 2018-04-14 DIAGNOSIS — D649 Anemia, unspecified: Secondary | ICD-10-CM

## 2018-04-14 NOTE — Patient Instructions (Signed)
[facility] Discharge Instructions     Thank you for choosing [facility] to provide your oncology and hematology care.  To afford each patient quality time with our provider, please arrive at least 15 minutes before your scheduled appointment time.   If you have a lab appointment with the Cancer Center please come in thru the  Main Entrance and check in at the main information desk  You need to re-schedule your appointment should you arrive 10 or more minutes late.  We strive to give you quality time with our providers, and arriving late affects you and other patients whose appointments are after yours.  Also, if you no show three or more times for appointments you may be dismissed from the clinic at the providers discretion.     Again, thank you for choosing [facility].  Our hope is that these requests will decrease the amount of time that you wait before being seen by our physicians.       _____________________________________________________________  Should you have questions after your visit to [facility], please contact our office at [phone] between the hours of 8:00 a.m. and 4:30 p.m.  Voicemails left after 4:00 p.m. will not be returned until the following business day.  For prescription refill requests, have your pharmacy contact our office and allow 72 hours.    Cancer Center Support Programs:   > Cancer Support Group  2nd Tuesday of the month 1pm-2pm, Journey Room    

## 2018-04-14 NOTE — Telephone Encounter (Signed)
-----   Message from Arnoldo Lenis, MD sent at 04/14/2018 10:19 AM EST ----- Mild placque in the arteries of the neck, not of significant concern at this time, we will continue to monitor   Zandra Abts MD

## 2018-04-14 NOTE — Progress Notes (Signed)
[name], [name]   CLINIC:  Medical Oncology/Hematology  PCP:  Doree Albee, MD [place] [address] [phone]   REASON FOR VISIT: Follow-up for normocytic anemia   CURRENT THERAPY: Procrit every 4 weeks    INTERVAL HISTORY:  [name] 77 y.o. female returns for routine follow-up for normocytic anemia. She is here today and feeling good. She is tolerating the injections well. She has no complaints at this time. She lives at home with her husband and performs all her own ADLs. She denies any new pains. Denies any headaches or vision changes. Denies any nausea, vomiting, or diarrhea. Denies any recent infections. Denies any easy bruising or bleeding. Her appetite and energy level is 100% and she is maintaining her weight well.    REVIEW OF SYSTEMS:  Review of Systems  All other systems reviewed and are negative.    PAST MEDICAL/SURGICAL HISTORY:  Past Medical History:  Diagnosis Date  . Anemia   . Arthritis   . Blood transfusion without reported diagnosis   . Cataract   . Chronic anemia   . Chronic combined systolic and diastolic CHF (congestive heart failure) ([place])    a. 2D echo 08/2016 at [facility]: EF 50-55% with inferior wall HK, impaired LV filling, fair study.  . CKD (chronic kidney disease), stage III ([place])   . Diabetes ([place])   . Gastritis   . GERD (gastroesophageal reflux disease)   . Gout   . HTN (hypertension)   . Hyperlipidemia   . Hypothyroidism   . Iron deficiency anemia 11/08/2016  . Normocytic anemia 10/26/2016  . NSTEMI (non-ST elevated myocardial infarction) ([place])    a. Complex admission 08/2016 - with severe hyperglycemia, AKI on CKD, severe anemia down to Hgb 6.8, acute combined CHF, troponin of 8.5, cath deferred due to renal dysfunction.  . Thyroid disease    Past Surgical History:  Procedure Laterality Date  . COLONOSCOPY WITH PROPOFOL N/A 10/08/2016   5 mm transverse colon polyp  note resected due to plavix. hemorrhoids  . ENTEROSCOPY N/A 08/08/2017   Procedure: ENTEROSCOPY;  Surgeon: Daneil Dolin, MD;  [name];  Service: Endoscopy;  Laterality: N/A;  . ESOPHAGOGASTRODUODENOSCOPY N/A 10/06/2016   mild chroni gastritis, negative H.pylori  . ESOPHAGOGASTRODUODENOSCOPY (EGD) WITH PROPOFOL N/A 03/12/2017   mild edema/erythema of stomach, small bowel biopsy with focal villous tip lymphocytosis, ?partially developed celiac  . 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 GIVENS CAPSULE STUDY  10/08/2016   normal  . GIVENS CAPSULE STUDY N/A 10/29/2016   occasional gastric erosion, unremarkable small bowel  . ORIF HUMERUS FRACTURE Left 02/07/2015   Procedure: OPEN REDUCTION INTERNAL FIXATION (ORIF) PROXIMAL HUMERUS FRACTURE;  Surgeon: Marybelle Killings, MD;  Location: [place];  Service: Orthopedics;  Laterality: Left;  . THYROID SURGERY    . TOTAL HIP ARTHROPLASTY Left 02/07/2015   Procedure: TOTAL HIP ARTHROPLASTY ANTERIOR APPROACH ;  Surgeon: Marybelle Killings, MD;  Location: [place];  Service: Orthopedics;  Laterality: Left;  . WRIST SURGERY Left      SOCIAL HISTORY:  Social History   Socioeconomic History  . Marital status: Widowed    Spouse name: [name]  . Number of children: 2  . Years of education: 19  .  Highest education level: Not on file  Occupational History  . Occupation: retired  Scientific laboratory technician  . Financial resource strain: Not on file  . Food insecurity:    Worry: Not on file    Inability: Not on file  . Transportation needs:    Medical: Not on file    Non-medical: Not on file  Tobacco Use  . Smoking status: Never Smoker  . Smokeless tobacco: Never Used  Substance and Sexual Activity  . Alcohol use: No    Alcohol/week: 0.0  standard drinks  . Drug use: No  . Sexual activity: Not Currently  Lifestyle  . Physical activity:    Days per week: Not on file    Minutes per session: Not on file  . Stress: Not on file  Relationships  . Social connections:    Talks on phone: Not on file    Gets together: Not on file    Attends religious service: Not on file    Active member of club or organization: Not on file    Attends meetings of clubs or organizations: Not on file    Relationship status: Not on file  . Intimate partner violence:    Fear of current or ex partner: Not on file    Emotionally abused: Not on file    Physically abused: Not on file    Forced sexual activity: Not on file  Other Topics Concern  . Not on file  Social History Narrative   Lives with significant other/partner [name]   He is her caregiver   Moved [facility]    FAMILY HISTORY:  Family History  Problem Relation Age of Onset  . Diabetes Mother   . Asthma Mother   . Early death Mother 66       Pneumonia  . Early death Father        Killed at rodeo  . CAD Neg Hx   . GI Bleed Neg Hx     CURRENT MEDICATIONS:  Outpatient Encounter Medications as of 04/14/2018  Medication Sig  . acetaminophen (TYLENOL) 325 MG tablet Take 2 tablets (650 mg total) by mouth every 6 (six) hours as needed for mild pain or headache (or Fever >/= 101).  . Alcohol Swabs (B-D SINGLE USE SWABS REGULAR) PADS   . amLODipine (NORVASC) 10 MG tablet Take 1 tablet (10 mg total) by mouth daily.  . cephALEXin (KEFLEX) 500 MG capsule   . docusate sodium (COLACE) 100 MG capsule Take 1 capsule (100 mg total) by mouth every 12 (twelve) hours. (Patient taking differently: Take 100 mg by mouth daily. )  . furosemide (LASIX) 20 MG tablet Take 1 tablet (20 mg total) by mouth as needed. Only has to take it when she gains 2 lbs  . gabapentin (NEURONTIN) 300 MG capsule Take 2 capsules (600 mg total) by mouth at bedtime.  Marland Kitchen glucose blood test strip 1 each by Other route 4 (four)  times daily. Use as instructed bid. E11.65  . hydrALAZINE (APRESOLINE) 25 MG tablet Take 1 tablet (25 mg total) by mouth 2 (two) times daily.  Marland Kitchen levothyroxine (SYNTHROID, LEVOTHROID) 50 MCG tablet TAKE 1 TABLET BY MOUTH ONCE DAILY BEFORE  BREAKFAST  . metoprolol tartrate (LOPRESSOR) 25 MG tablet Take 0.5 tablets (12.5 mg total) by mouth 2 (two) times daily.  . Multiple Vitamin (MULTIVITAMIN WITH MINERALS) TABS tablet Take 1 tablet by mouth daily.  . ondansetron (ZOFRAN) 4 MG tablet Take 1 tablet (4 mg total) by mouth every 8 (  eight) hours as needed for nausea or vomiting.  . pantoprazole (PROTONIX) 40 MG tablet Take 1 tablet (40 mg total) by mouth 2 (two) times daily. 30 minutes before breakfast  . ULORIC 40 MG tablet Take 1 tablet by mouth daily.  . [DISCONTINUED] Insulin Glargine (TOUJEO MAX SOLOSTAR) 300 UNIT/ML SOPN Inject 20 Units into the skin at bedtime.  . [DISCONTINUED] TOUJEO SOLOSTAR 300 UNIT/ML SOPN    Facility-Administered Encounter Medications as of 04/14/2018  Medication  . Darbepoetin Alfa (ARANESP) injection 200 mcg    ALLERGIES:  Allergies  Allergen Reactions  . Insulin Glargine Swelling    "Makes me swell like a balloon all over", including face, but without any respiratory distress or rashes. Associated with weight gain.  . Statins Other (See Comments)    "I've tried them all; my muscle aches were so bad I couldn't walk".     PHYSICAL EXAM:  ECOG Performance status: 1  Vitals:   04/14/18 1037  BP: (!) 147/52  Pulse: 62  Resp: 16  Temp: 97.9 F (36.6 C)  SpO2: 97%   Filed Weights   04/14/18 1037  Weight: 175 lb 6.4 oz (79.6 kg)    Physical Exam  Constitutional: She is oriented to person, place, and time. She appears well-developed and well-nourished.  Cardiovascular: Normal rate, regular rhythm and normal heart sounds.  Pulmonary/Chest: Effort normal and breath sounds normal.  Musculoskeletal: Normal range of motion.  Neurological: She is alert and  oriented to person, place, and time.  Skin: Skin is warm and dry.  Psychiatric: She has a normal mood and affect. Her behavior is normal. Judgment and thought content normal.     LABORATORY DATA:  I have reviewed the labs as listed.  CBC    Component Value Date/Time   WBC 7.3 04/11/2018 1258   RBC 4.01 04/11/2018 1258   HGB 11.3 (L) 04/11/2018 1258   HCT 36.8 04/11/2018 1258   PLT 274 04/11/2018 1258   MCV 91.8 04/11/2018 1258   MCH 28.2 04/11/2018 1258   MCHC 30.7 04/11/2018 1258   RDW 15.1 04/11/2018 1258   LYMPHSABS 1.1 03/28/2018 1207   MONOABS 0.4 03/28/2018 1207   EOSABS 1.1 (H) 03/28/2018 1207   BASOSABS 0.1 03/28/2018 1207   CMP Latest Ref Rng & Units 03/28/2018 03/28/2018 11/22/2017  Glucose 70 - 99 mg/dL 142(H) - 120(H)  BUN 8 - 23 mg/dL 47(H) - 48(H)  Creatinine 0.44 - 1.00 mg/dL 2.50(H) - 2.46(H)  Sodium 135 - 145 mmol/L 140 - 141  Potassium 3.5 - 5.1 mmol/L 4.5 - 5.1  Chloride 98 - 111 mmol/L 112(H) - 107  CO2 22 - 32 mmol/L 20(L) - 25  Calcium 8.7 - 10.3 mg/dL 8.6(L) 8.6(L) 9.3  Total Protein 6.5 - 8.1 g/dL 7.1 - 7.4  Total Bilirubin 0.3 - 1.2 mg/dL 0.8 - 0.5  Alkaline Phos 38 - 126 U/L 89 - 68  AST 15 - 41 U/L 15 - 15  ALT 0 - 44 U/L 10 - 9          ASSESSMENT & PLAN:   Normocytic anemia 1. Normocytic anemia: - Secondary to CKD and relative iron deficiency. -Denies any bleeding per rectum or melena.  She has been off of aspirin and Plavix. - She was previously receiving Aranesp, which was changed to Retacrit on 02/14/2018.  Last dose of reticulocyte crit was on 02/28/2018. - Feraheme was given on 03/22/2018 and 03/29/2018. - Her hemoglobin today improved to 11.3.  She does not  require retacrit today. -I will switch her blood check and treatments to every 4 weeks. -She will come back in 3 months for follow-up with repeat ferritin and iron panel.      Orders placed this encounter:  Orders Placed This Encounter  Procedures  . CBC with  Differential/Platelet  . Comprehensive metabolic panel  . Lactate dehydrogenase  . CBC with Differential/Platelet  . Comprehensive metabolic panel      Derek Jack, MD [place] [phone]

## 2018-04-14 NOTE — Telephone Encounter (Signed)
LM to return call.

## 2018-04-14 NOTE — Assessment & Plan Note (Signed)
1. Normocytic anemia: - Secondary to CKD and relative iron deficiency. -Denies any bleeding per rectum or melena.  She has been off of aspirin and Plavix. - She was previously receiving Aranesp, which was changed to Retacrit on 02/14/2018.  Last dose of reticulocyte crit was on 02/28/2018. - Feraheme was given on 03/22/2018 and 03/29/2018. - Her hemoglobin today improved to 11.3.  She does not require retacrit today. -I will switch her blood check and treatments to every 4 weeks. -She will come back in 3 months for follow-up with repeat ferritin and iron panel.

## 2018-04-14 NOTE — Progress Notes (Signed)
[name] presents today for Retacrit injection. Hgb reviewed, 11.3. Retacrit held per parameters.

## 2018-04-17 NOTE — Telephone Encounter (Signed)
-----   Message from Arnoldo Lenis, MD sent at 04/14/2018 10:19 AM EST ----- Mild placque in the arteries of the neck, not of significant concern at this time, we will continue to monitor   Zandra Abts MD

## 2018-04-17 NOTE — Telephone Encounter (Signed)
Pt sent message through [place] - routed to pcp

## 2018-04-22 DIAGNOSIS — L97511 Non-pressure chronic ulcer of other part of right foot limited to breakdown of skin: Secondary | ICD-10-CM | POA: Diagnosis not present

## 2018-05-01 ENCOUNTER — Other Ambulatory Visit: Payer: Self-pay

## 2018-05-01 DIAGNOSIS — N185 Chronic kidney disease, stage 5: Secondary | ICD-10-CM

## 2018-05-07 DIAGNOSIS — N390 Urinary tract infection, site not specified: Secondary | ICD-10-CM | POA: Diagnosis not present

## 2018-05-13 ENCOUNTER — Encounter (HOSPITAL_COMMUNITY): Payer: Self-pay

## 2018-05-13 ENCOUNTER — Inpatient Hospital Stay (HOSPITAL_COMMUNITY): Payer: Medicare HMO | Attending: Hematology

## 2018-05-13 ENCOUNTER — Other Ambulatory Visit: Payer: Self-pay

## 2018-05-13 ENCOUNTER — Inpatient Hospital Stay (HOSPITAL_COMMUNITY): Payer: Medicare HMO

## 2018-05-13 VITALS — BP 134/53 | HR 68 | Temp 97.8°F | Resp 16

## 2018-05-13 DIAGNOSIS — D631 Anemia in chronic kidney disease: Secondary | ICD-10-CM

## 2018-05-13 DIAGNOSIS — N184 Chronic kidney disease, stage 4 (severe): Secondary | ICD-10-CM

## 2018-05-13 DIAGNOSIS — N185 Chronic kidney disease, stage 5: Secondary | ICD-10-CM | POA: Diagnosis not present

## 2018-05-13 LAB — CBC
HEMATOCRIT: 33.1 % — AB (ref 36.0–46.0)
Hemoglobin: 10.3 g/dL — ABNORMAL LOW (ref 12.0–15.0)
MCH: 28.9 pg (ref 26.0–34.0)
MCHC: 31.1 g/dL (ref 30.0–36.0)
MCV: 92.7 fL (ref 80.0–100.0)
PLATELETS: 288 10*3/uL (ref 150–400)
RBC: 3.57 MIL/uL — ABNORMAL LOW (ref 3.87–5.11)
RDW: 14.2 % (ref 11.5–15.5)
WBC: 9 10*3/uL (ref 4.0–10.5)
nRBC: 0 % (ref 0.0–0.2)

## 2018-05-13 MED ORDER — EPOETIN ALFA-EPBX 40000 UNIT/ML IJ SOLN
40000.0000 [IU] | Freq: Once | INTRAMUSCULAR | Status: AC
  Administered 2018-05-13: 40000 [IU] via SUBCUTANEOUS
  Filled 2018-05-13: qty 1

## 2018-05-13 NOTE — Progress Notes (Signed)
[name] presents today for injection per the provider's orders.  Retacrit administration without incident; see MAR for injection details.  Patient tolerated procedure well and without incident.  No questions or complaints noted at this time.  Discharged ambulatory in c/o spouse.

## 2018-05-14 ENCOUNTER — Ambulatory Visit: Payer: Medicare HMO | Admitting: "Endocrinology

## 2018-05-30 ENCOUNTER — Other Ambulatory Visit (HOSPITAL_COMMUNITY)
Admission: RE | Admit: 2018-05-30 | Discharge: 2018-05-30 | Disposition: A | Discharge status: Home / Self Care | Payer: Medicare HMO | Source: Ambulatory Visit | Attending: Nephrology | Admitting: Nephrology

## 2018-05-30 DIAGNOSIS — Z79899 Other long term (current) drug therapy: Secondary | ICD-10-CM | POA: Insufficient documentation

## 2018-05-30 DIAGNOSIS — D509 Iron deficiency anemia, unspecified: Secondary | ICD-10-CM | POA: Insufficient documentation

## 2018-05-30 DIAGNOSIS — N183 Chronic kidney disease, stage 3 (moderate): Secondary | ICD-10-CM | POA: Insufficient documentation

## 2018-05-30 DIAGNOSIS — I10 Essential (primary) hypertension: Secondary | ICD-10-CM | POA: Insufficient documentation

## 2018-05-30 DIAGNOSIS — E559 Vitamin D deficiency, unspecified: Secondary | ICD-10-CM | POA: Diagnosis not present

## 2018-05-30 DIAGNOSIS — R809 Proteinuria, unspecified: Secondary | ICD-10-CM | POA: Insufficient documentation

## 2018-05-30 LAB — IRON AND TIBC
Iron: 47 ug/dL (ref 28–170)
Saturation Ratios: 19 % (ref 10.4–31.8)
TIBC: 246 ug/dL — AB (ref 250–450)
UIBC: 199 ug/dL

## 2018-05-30 LAB — PROTEIN / CREATININE RATIO, URINE
Creatinine, Urine: 71.99 mg/dL
Protein Creatinine Ratio: 3.97 mg/mg{Cre} — ABNORMAL HIGH (ref 0.00–0.15)
Total Protein, Urine: 286 mg/dL

## 2018-05-30 LAB — HEMOGLOBIN AND HEMATOCRIT, BLOOD
HCT: 35.8 % — ABNORMAL LOW (ref 36.0–46.0)
Hemoglobin: 10.8 g/dL — ABNORMAL LOW (ref 12.0–15.0)

## 2018-05-30 LAB — RENAL FUNCTION PANEL
Albumin: 3.3 g/dL — ABNORMAL LOW (ref 3.5–5.0)
Anion gap: 10 (ref 5–15)
BUN: 61 mg/dL — ABNORMAL HIGH (ref 8–23)
CO2: 16 mmol/L — ABNORMAL LOW (ref 22–32)
Calcium: 8.5 mg/dL — ABNORMAL LOW (ref 8.9–10.3)
Chloride: 114 mmol/L — ABNORMAL HIGH (ref 98–111)
Creatinine, Ser: 2.92 mg/dL — ABNORMAL HIGH (ref 0.44–1.00)
GFR calc Af Amer: 17 mL/min — ABNORMAL LOW (ref >60)
GFR calc non Af Amer: 15 mL/min — ABNORMAL LOW (ref >60)
GLUCOSE: 162 mg/dL — AB (ref 70–99)
POTASSIUM: 4.6 mmol/L (ref 3.5–5.1)
Phosphorus: 5.3 mg/dL — ABNORMAL HIGH (ref 2.5–4.6)
Sodium: 140 mmol/L (ref 135–145)

## 2018-05-31 LAB — PARATHYROID HORMONE, INTACT (NO CA): PTH: 46 pg/mL (ref 15–65)

## 2018-05-31 LAB — VITAMIN D 25 HYDROXY (VIT D DEFICIENCY, FRACTURES): Vit D, 25-Hydroxy: 16.3 ng/mL — ABNORMAL LOW (ref 30.0–100.0)

## 2018-06-02 ENCOUNTER — Encounter: Payer: Self-pay | Admitting: Vascular Surgery

## 2018-06-02 ENCOUNTER — Ambulatory Visit: Payer: Medicare HMO | Admitting: Vascular Surgery

## 2018-06-02 VITALS — BP 168/72 | HR 71 | Temp 96.0°F | Resp 18 | Ht 64.0 in | Wt 175.8 lb

## 2018-06-02 DIAGNOSIS — N185 Chronic kidney disease, stage 5: Secondary | ICD-10-CM | POA: Diagnosis not present

## 2018-06-02 NOTE — Progress Notes (Signed)
[facility] [place]  Patient name: [name] MRN: [id number] DOB: 1940/12/10 Sex: female  REASON FOR CONSULT: Discuss access for hemodialysis  Seen today in our [place] office  HPI: [name] is a 78 y.o. female, who is here today for discussion of access for hemodialysis.  She is not yet on dialysis.  Her renal insufficiency is felt to be related to hypertension.  Right-handed.  She has had a left humerus fracture with plates and is unable to take her arm to a 90 degree level.  Has no history of peripheral vascular occlusive disease.  He is diabetic.  She does not have a pacemaker  Past Medical History:  Diagnosis Date  . Anemia   . Arthritis   . Blood transfusion without reported diagnosis   . Cataract   . Chronic anemia   . Chronic combined systolic and diastolic CHF (congestive heart failure) ([place])    a. 2D echo 08/2016 at [facility]: EF 50-55% with inferior wall HK, impaired LV filling, fair study.  . CKD (chronic kidney disease), stage III ([place])   . Diabetes ([place])   . Gastritis   . GERD (gastroesophageal reflux disease)   . Gout   . HTN (hypertension)   . Hyperlipidemia   . Hypothyroidism   . Iron deficiency anemia 11/08/2016  . Normocytic anemia 10/26/2016  . NSTEMI (non-ST elevated myocardial infarction) ([place])    a. Complex admission 08/2016 - with severe hyperglycemia, AKI on CKD, severe anemia down to Hgb 6.8, acute combined CHF, troponin of 8.5, cath deferred due to renal dysfunction.  . Thyroid disease     Family History  Problem Relation Age of Onset  . Diabetes Mother   . Asthma Mother   . Lachell Rochette death Mother 80       Pneumonia  . Dennard Vezina death Father        Killed at rodeo  . CAD Neg Hx   . GI Bleed Neg Hx     SOCIAL HISTORY: Social History   Socioeconomic History  . Marital status: Widowed    Spouse name: [name]  . Number of children: 2  . Years of education: 64  . Highest education  level: Not on file  Occupational History  . Occupation: retired  Scientific laboratory technician  . Financial resource strain: Not on file  . Food insecurity:    Worry: Not on file    Inability: Not on file  . Transportation needs:    Medical: Not on file    Non-medical: Not on file  Tobacco Use  . Smoking status: Never Smoker  . Smokeless tobacco: Never Used  Substance and Sexual Activity  . Alcohol use: No    Alcohol/week: 0.0 standard drinks  . Drug use: No  . Sexual activity: Not Currently  Lifestyle  . Physical activity:    Days per week: Not on file    Minutes per session: Not on file  . Stress: Not on file  Relationships  . Social connections:    Talks on phone: Not on file    Gets together: Not on file    Attends religious service: Not on file    Active member of club or organization: Not on file    Attends meetings of clubs or organizations: Not on file    Relationship status: Not on file  . Intimate partner violence:    Fear of current or ex partner: Not on file    Emotionally abused: Not on  file    Physically abused: Not on file    Forced sexual activity: Not on file  Other Topics Concern  . Not on file  Social History Narrative   Lives with significant other/partner [name]   He is her caregiver   Moved from [facility]    Allergies  Allergen Reactions  . Insulin Glargine Swelling    "Makes me swell like a balloon all over", including face, but without any respiratory distress or rashes. Associated with weight gain.  . Statins Other (See Comments)    "I've tried them all; my muscle aches were so bad I couldn't walk".    Current Outpatient Medications  Medication Sig Dispense Refill  . acetaminophen (TYLENOL) 325 MG tablet Take 2 tablets (650 mg total) by mouth every 6 (six) hours as needed for mild pain or headache (or Fever >/= 101). 40 tablet 0  . Alcohol Swabs (B-D SINGLE USE SWABS REGULAR) PADS     . amLODipine (NORVASC) 10 MG tablet Take 1 tablet (10 mg total) by mouth  daily. 90 tablet 3  . docusate sodium (COLACE) 100 MG capsule Take 1 capsule (100 mg total) by mouth every 12 (twelve) hours. (Patient taking differently: Take 100 mg by mouth daily. ) 60 capsule 0  . furosemide (LASIX) 20 MG tablet Take 1 tablet (20 mg total) by mouth as needed. Only has to take it when she gains 2 lbs 30 tablet 1  . gabapentin (NEURONTIN) 300 MG capsule Take 2 capsules (600 mg total) by mouth at bedtime. 180 capsule 0  . glucose blood test strip 1 each by Other route 4 (four) times daily. Use as instructed bid. E11.65 400 each 2  . hydrALAZINE (APRESOLINE) 25 MG tablet Take 1 tablet (25 mg total) by mouth 2 (two) times daily. 180 tablet 1  . levothyroxine (SYNTHROID, LEVOTHROID) 50 MCG tablet TAKE 1 TABLET BY MOUTH ONCE DAILY BEFORE  BREAKFAST 90 tablet 1  . metoprolol tartrate (LOPRESSOR) 25 MG tablet Take 0.5 tablets (12.5 mg total) by mouth 2 (two) times daily. 30 tablet 0  . Multiple Vitamin (MULTIVITAMIN WITH MINERALS) TABS tablet Take 1 tablet by mouth daily.    . ondansetron (ZOFRAN) 4 MG tablet Take 1 tablet (4 mg total) by mouth every 8 (eight) hours as needed for nausea or vomiting. 30 tablet 3  . pantoprazole (PROTONIX) 40 MG tablet Take 1 tablet (40 mg total) by mouth 2 (two) times daily. 30 minutes before breakfast 180 tablet 3  . ULORIC 40 MG tablet Take 1 tablet by mouth daily.  3   No current facility-administered medications for this visit.    Facility-Administered Medications Ordered in Other Visits  Medication Dose Route Frequency Provider Last Rate Last Dose  . Darbepoetin Alfa (ARANESP) injection 200 mcg  200 mcg Subcutaneous Once Higgs, Vetta, MD        REVIEW OF SYSTEMS:  [X]  denotes positive finding, [ ]  denotes negative finding Cardiac  Comments:  Chest pain or chest pressure:    Shortness of breath upon exertion:    Short of breath when lying flat:    Irregular heart rhythm:        Vascular    Pain in calf, thigh, or hip brought on by  ambulation:    Pain in feet at night that wakes you up from your sleep:     Blood clot in your veins:    Leg swelling:         Pulmonary  Oxygen at home:    Productive cough:     Wheezing:         Neurologic    Sudden weakness in arms or legs:     Sudden numbness in arms or legs:     Sudden onset of difficulty speaking or slurred speech:    Temporary loss of vision in one eye:     Problems with dizziness:         Gastrointestinal    Blood in stool:     Vomited blood:         Genitourinary    Burning when urinating:     Blood in urine:        Psychiatric    Major depression:         Hematologic    Bleeding problems:    Problems with blood clotting too easily:        Skin    Rashes or ulcers:        Constitutional    Fever or chills:      PHYSICAL EXAM: Vitals:   06/02/18 1500  BP: (!) 168/72  Pulse: 71  Resp: 18  Temp: (!) 96 F (35.6 C)  TempSrc: Temporal  Weight: 175 lb 12.8 oz (79.7 kg)  Height: 5\' 4"  (1.626 m)    GENERAL: The patient is a well-nourished female, in no acute distress. The vital signs are documented above. CARDIOVASCULAR: 2+ radial pulses bilaterally.  Carotid arteries without bruits bilaterally.  Small surface veins bilateral PULMONARY: There is good air exchange  ABDOMEN: Soft and non-tender  MUSCULOSKELETAL: There are no major deformities or cyanosis. NEUROLOGIC: No focal weakness or paresthesias are detected. SKIN: There are no ulcers or rashes noted. PSYCHIATRIC: The patient has a normal affect.  DATA:  I imaged her upper extremity veins with SonoSite ultrasound.  Her cephalic veins are small bilaterally.  These are not adequate for fistula creation.  She does have moderate basilic veins bilaterally.  This is larger on the right than on the left.  MEDICAL ISSUES: Long discussion with the patient and her caretaker present.  I have discussed the option of tunneled catheter for acute hemodialysis, AV graft and AV fistula.  She does  appear to be a good candidate for a right arm basilic vein fistula.  I explained that this would require 2 separate outpatient surgeries.  The first would be creation of a brachial artery to basilic vein fistula.  We would then see her in the office in 4 to 6 weeks to assure good sized maturation.  Would then proceed with stage transposition also as an outpatient.  Understands the potential for non-maturation.  Also discussed options for AV graft if fistula failed.  We will schedule this at her earliest [place]. Lucerito Rosinski, MD [facility] Tel [phone] Pager [phone]

## 2018-06-02 NOTE — H&P (View-Only) (Signed)
[facility] [place]  Patient name: [name] MRN: [id number] DOB: 02-23-1941 Sex: female  REASON FOR CONSULT: Discuss access for hemodialysis  Seen today in our [place] office  HPI: [name] is a 78 y.o. female, who is here today for discussion of access for hemodialysis.  She is not yet on dialysis.  Her renal insufficiency is felt to be related to hypertension.  Right-handed.  She has had a left humerus fracture with plates and is unable to take her arm to a 90 degree level.  Has no history of peripheral vascular occlusive disease.  He is diabetic.  She does not have a pacemaker  Past Medical History:  Diagnosis Date  . Anemia   . Arthritis   . Blood transfusion without reported diagnosis   . Cataract   . Chronic anemia   . Chronic combined systolic and diastolic CHF (congestive heart failure) ([place])    a. 2D echo 08/2016 at [facility]: EF 50-55% with inferior wall HK, impaired LV filling, fair study.  . CKD (chronic kidney disease), stage III ([place])   . Diabetes ([place])   . Gastritis   . GERD (gastroesophageal reflux disease)   . Gout   . HTN (hypertension)   . Hyperlipidemia   . Hypothyroidism   . Iron deficiency anemia 11/08/2016  . Normocytic anemia 10/26/2016  . NSTEMI (non-ST elevated myocardial infarction) ([place])    a. Complex admission 08/2016 - with severe hyperglycemia, AKI on CKD, severe anemia down to Hgb 6.8, acute combined CHF, troponin of 8.5, cath deferred due to renal dysfunction.  . Thyroid disease     Family History  Problem Relation Age of Onset  . Diabetes Mother   . Asthma Mother   . Early death Mother 80       Pneumonia  . Early death Father        Killed at rodeo  . CAD Neg Hx   . GI Bleed Neg Hx     SOCIAL HISTORY: Social History   Socioeconomic History  . Marital status: Widowed    Spouse name: [name]  . Number of children: 2  . Years of education: 12  . Highest education  level: Not on file  Occupational History  . Occupation: retired  Scientific laboratory technician  . Financial resource strain: Not on file  . Food insecurity:    Worry: Not on file    Inability: Not on file  . Transportation needs:    Medical: Not on file    Non-medical: Not on file  Tobacco Use  . Smoking status: Never Smoker  . Smokeless tobacco: Never Used  Substance and Sexual Activity  . Alcohol use: No    Alcohol/week: 0.0 standard drinks  . Drug use: No  . Sexual activity: Not Currently  Lifestyle  . Physical activity:    Days per week: Not on file    Minutes per session: Not on file  . Stress: Not on file  Relationships  . Social connections:    Talks on phone: Not on file    Gets together: Not on file    Attends religious service: Not on file    Active member of club or organization: Not on file    Attends meetings of clubs or organizations: Not on file    Relationship status: Not on file  . Intimate partner violence:    Fear of current or ex partner: Not on file    Emotionally abused: Not on  file    Physically abused: Not on file    Forced sexual activity: Not on file  Other Topics Concern  . Not on file  Social History Narrative   Lives with significant other/partner [name]   He is her caregiver   Moved from [facility]    Allergies  Allergen Reactions  . Insulin Glargine Swelling    "Makes me swell like a balloon all over", including face, but without any respiratory distress or rashes. Associated with weight gain.  . Statins Other (See Comments)    "I've tried them all; my muscle aches were so bad I couldn't walk".    Current Outpatient Medications  Medication Sig Dispense Refill  . acetaminophen (TYLENOL) 325 MG tablet Take 2 tablets (650 mg total) by mouth every 6 (six) hours as needed for mild pain or headache (or Fever >/= 101). 40 tablet 0  . Alcohol Swabs (B-D SINGLE USE SWABS REGULAR) PADS     . amLODipine (NORVASC) 10 MG tablet Take 1 tablet (10 mg total) by mouth  daily. 90 tablet 3  . docusate sodium (COLACE) 100 MG capsule Take 1 capsule (100 mg total) by mouth every 12 (twelve) hours. (Patient taking differently: Take 100 mg by mouth daily. ) 60 capsule 0  . furosemide (LASIX) 20 MG tablet Take 1 tablet (20 mg total) by mouth as needed. Only has to take it when she gains 2 lbs 30 tablet 1  . gabapentin (NEURONTIN) 300 MG capsule Take 2 capsules (600 mg total) by mouth at bedtime. 180 capsule 0  . glucose blood test strip 1 each by Other route 4 (four) times daily. Use as instructed bid. E11.65 400 each 2  . hydrALAZINE (APRESOLINE) 25 MG tablet Take 1 tablet (25 mg total) by mouth 2 (two) times daily. 180 tablet 1  . levothyroxine (SYNTHROID, LEVOTHROID) 50 MCG tablet TAKE 1 TABLET BY MOUTH ONCE DAILY BEFORE  BREAKFAST 90 tablet 1  . metoprolol tartrate (LOPRESSOR) 25 MG tablet Take 0.5 tablets (12.5 mg total) by mouth 2 (two) times daily. 30 tablet 0  . Multiple Vitamin (MULTIVITAMIN WITH MINERALS) TABS tablet Take 1 tablet by mouth daily.    . ondansetron (ZOFRAN) 4 MG tablet Take 1 tablet (4 mg total) by mouth every 8 (eight) hours as needed for nausea or vomiting. 30 tablet 3  . pantoprazole (PROTONIX) 40 MG tablet Take 1 tablet (40 mg total) by mouth 2 (two) times daily. 30 minutes before breakfast 180 tablet 3  . ULORIC 40 MG tablet Take 1 tablet by mouth daily.  3   No current facility-administered medications for this visit.    Facility-Administered Medications Ordered in Other Visits  Medication Dose Route Frequency Provider Last Rate Last Dose  . Darbepoetin Alfa (ARANESP) injection 200 mcg  200 mcg Subcutaneous Once Higgs, Vetta, MD        REVIEW OF SYSTEMS:  [X]  denotes positive finding, [ ]  denotes negative finding Cardiac  Comments:  Chest pain or chest pressure:    Shortness of breath upon exertion:    Short of breath when lying flat:    Irregular heart rhythm:        Vascular    Pain in calf, thigh, or hip brought on by  ambulation:    Pain in feet at night that wakes you up from your sleep:     Blood clot in your veins:    Leg swelling:         Pulmonary  Oxygen at home:    Productive cough:     Wheezing:         Neurologic    Sudden weakness in arms or legs:     Sudden numbness in arms or legs:     Sudden onset of difficulty speaking or slurred speech:    Temporary loss of vision in one eye:     Problems with dizziness:         Gastrointestinal    Blood in stool:     Vomited blood:         Genitourinary    Burning when urinating:     Blood in urine:        Psychiatric    Major depression:         Hematologic    Bleeding problems:    Problems with blood clotting too easily:        Skin    Rashes or ulcers:        Constitutional    Fever or chills:      PHYSICAL EXAM: Vitals:   06/02/18 1500  BP: (!) 168/72  Pulse: 71  Resp: 18  Temp: (!) 96 F (35.6 C)  TempSrc: Temporal  Weight: 175 lb 12.8 oz (79.7 kg)  Height: 5\' 4"  (1.626 m)    GENERAL: The patient is a well-nourished female, in no acute distress. The vital signs are documented above. CARDIOVASCULAR: 2+ radial pulses bilaterally.  Carotid arteries without bruits bilaterally.  Small surface veins bilateral PULMONARY: There is good air exchange  ABDOMEN: Soft and non-tender  MUSCULOSKELETAL: There are no major deformities or cyanosis. NEUROLOGIC: No focal weakness or paresthesias are detected. SKIN: There are no ulcers or rashes noted. PSYCHIATRIC: The patient has a normal affect.  DATA:  I imaged her upper extremity veins with SonoSite ultrasound.  Her cephalic veins are small bilaterally.  These are not adequate for fistula creation.  She does have moderate basilic veins bilaterally.  This is larger on the right than on the left.  MEDICAL ISSUES: Long discussion with the patient and her caretaker present.  I have discussed the option of tunneled catheter for acute hemodialysis, AV graft and AV fistula.  She does  appear to be a good candidate for a right arm basilic vein fistula.  I explained that this would require 2 separate outpatient surgeries.  The first would be creation of a brachial artery to basilic vein fistula.  We would then see her in the office in 4 to 6 weeks to assure good sized maturation.  Would then proceed with stage transposition also as an outpatient.  Understands the potential for non-maturation.  Also discussed options for AV graft if fistula failed.  We will schedule this at her earliest [place]. Early, MD [facility] Tel [phone] Pager [phone]

## 2018-06-03 ENCOUNTER — Telehealth: Payer: Self-pay

## 2018-06-03 ENCOUNTER — Other Ambulatory Visit: Payer: Self-pay

## 2018-06-03 DIAGNOSIS — E872 Acidosis: Secondary | ICD-10-CM | POA: Diagnosis not present

## 2018-06-03 DIAGNOSIS — D638 Anemia in other chronic diseases classified elsewhere: Secondary | ICD-10-CM | POA: Diagnosis not present

## 2018-06-03 DIAGNOSIS — E1129 Type 2 diabetes mellitus with other diabetic kidney complication: Secondary | ICD-10-CM | POA: Diagnosis not present

## 2018-06-03 DIAGNOSIS — L97511 Non-pressure chronic ulcer of other part of right foot limited to breakdown of skin: Secondary | ICD-10-CM | POA: Diagnosis not present

## 2018-06-03 DIAGNOSIS — I10 Essential (primary) hypertension: Secondary | ICD-10-CM | POA: Diagnosis not present

## 2018-06-03 DIAGNOSIS — N184 Chronic kidney disease, stage 4 (severe): Secondary | ICD-10-CM | POA: Diagnosis not present

## 2018-06-03 NOTE — Telephone Encounter (Signed)
Spoke to pt. To schedule surgery date/time. Gave pre-operative instructions. Pt. Verbalized understanding and has no further questions at this time.

## 2018-06-04 DIAGNOSIS — I10 Essential (primary) hypertension: Secondary | ICD-10-CM | POA: Diagnosis not present

## 2018-06-04 DIAGNOSIS — E039 Hypothyroidism, unspecified: Secondary | ICD-10-CM | POA: Diagnosis not present

## 2018-06-04 DIAGNOSIS — E119 Type 2 diabetes mellitus without complications: Secondary | ICD-10-CM | POA: Diagnosis not present

## 2018-06-04 DIAGNOSIS — I129 Hypertensive chronic kidney disease with stage 1 through stage 4 chronic kidney disease, or unspecified chronic kidney disease: Secondary | ICD-10-CM | POA: Diagnosis not present

## 2018-06-10 ENCOUNTER — Encounter (HOSPITAL_COMMUNITY): Payer: Self-pay

## 2018-06-10 ENCOUNTER — Inpatient Hospital Stay (HOSPITAL_COMMUNITY): Payer: Medicare HMO

## 2018-06-10 ENCOUNTER — Inpatient Hospital Stay (HOSPITAL_COMMUNITY): Payer: Medicare HMO | Attending: Hematology

## 2018-06-10 VITALS — BP 150/48 | HR 62 | Temp 98.2°F | Resp 18

## 2018-06-10 DIAGNOSIS — D631 Anemia in chronic kidney disease: Secondary | ICD-10-CM | POA: Diagnosis not present

## 2018-06-10 DIAGNOSIS — N184 Chronic kidney disease, stage 4 (severe): Secondary | ICD-10-CM

## 2018-06-10 DIAGNOSIS — Z79899 Other long term (current) drug therapy: Secondary | ICD-10-CM | POA: Diagnosis not present

## 2018-06-10 DIAGNOSIS — N185 Chronic kidney disease, stage 5: Secondary | ICD-10-CM | POA: Insufficient documentation

## 2018-06-10 LAB — CBC
HCT: 34 % — ABNORMAL LOW (ref 36.0–46.0)
HEMOGLOBIN: 10.5 g/dL — AB (ref 12.0–15.0)
MCH: 28.6 pg (ref 26.0–34.0)
MCHC: 30.9 g/dL (ref 30.0–36.0)
MCV: 92.6 fL (ref 80.0–100.0)
Platelets: 247 10*3/uL (ref 150–400)
RBC: 3.67 MIL/uL — ABNORMAL LOW (ref 3.87–5.11)
RDW: 14.5 % (ref 11.5–15.5)
WBC: 7.1 10*3/uL (ref 4.0–10.5)
nRBC: 0 % (ref 0.0–0.2)

## 2018-06-10 MED ORDER — FULVESTRANT 250 MG/5ML IM SOLN
INTRAMUSCULAR | Status: AC
  Filled 2018-06-10: qty 5

## 2018-06-10 MED ORDER — EPOETIN ALFA-EPBX 40000 UNIT/ML IJ SOLN
40000.0000 [IU] | Freq: Once | INTRAMUSCULAR | Status: AC
  Administered 2018-06-10: 40000 [IU] via SUBCUTANEOUS
  Filled 2018-06-10: qty 1

## 2018-06-10 NOTE — Progress Notes (Signed)
[name] tolerated Retacrit injection well without complaints or incident. Hgb 10.5 today. VSS Pt discharged self ambulatory in satisfactory condition

## 2018-06-10 NOTE — Patient Instructions (Signed)
[place] at [facility] Discharge Instructions  Received Retacrit injection today. Follow-up as scheduled. Call clinic for any questions or concerns   Thank you for choosing [place] at [facility] to provide your oncology and hematology care.  To afford each patient quality time with our provider, please arrive at least 15 minutes before your scheduled appointment time.   If you have a lab appointment with the [facility] please come in thru the  Main Entrance and check in at the main information desk  You need to re-schedule your appointment should you arrive 10 or more minutes late.  We strive to give you quality time with our providers, and arriving late affects you and other patients whose appointments are after yours.  Also, if you no show three or more times for appointments you may be dismissed from the clinic at the providers discretion.     Again, thank you for choosing [facility].  Our hope is that these requests will decrease the amount of time that you wait before being seen by our physicians.       _____________________________________________________________  Should you have questions after your visit to [facility], please contact our office at [phone] between the hours of 8:00 a.m. and 4:30 p.m.  Voicemails left after 4:00 p.m. will not be returned until the following business day.  For prescription refill requests, have your pharmacy contact our office and allow 72 hours.    Cancer Center Support Programs:   > Cancer Support Group  2nd Tuesday of the month 1pm-2pm, Journey Room

## 2018-06-11 ENCOUNTER — Encounter (HOSPITAL_COMMUNITY): Payer: Self-pay | Admitting: *Deleted

## 2018-06-11 ENCOUNTER — Other Ambulatory Visit: Payer: Self-pay

## 2018-06-11 NOTE — Progress Notes (Signed)
Pt denies SOB and chest pain. Pt under the care of Dr. Harl Bowie, Cardiology. Pt denies having a stress test and cardiac cath. Pt denies having an A1c within the last 2 months. Pt made aware to stop taking vitamins, fish oil and herbal medications. Do not take any NSAIDs ie: Ibuprofen, Advil, Naproxen (Aleve), Motrin, BC and Goody Powder. Pt made aware to take 5 units of [place] insulin tonight. Pt made aware to check BG every 2 hours prior to arrival to hospital on DOS. Pt made aware to treat a BG < 70 with 4 glucose tabs or glucose gel or 4 ounces of apple or cranberry juice, wait 15 minutes after intervention to recheck BG, if BG remains < 70, call Short Stay unit to speak with a nurse. Pt verbalized understanding of all pre-op instructions.

## 2018-06-11 NOTE — Anesthesia Preprocedure Evaluation (Addendum)
Anesthesia Evaluation  Patient identified by MRN, date of birth, ID band Patient awake    Reviewed: Allergy & Precautions, H&P , NPO status , Patient's Chart, lab work & pertinent test results, reviewed documented beta blocker date and time   Airway Mallampati: III  TM Distance: >3 FB Neck ROM: Full    Dental no notable dental hx. (+) Upper Dentures, Partial Lower, Dental Advisory Given   Pulmonary neg pulmonary ROS,    Pulmonary exam normal breath sounds clear to auscultation       Cardiovascular hypertension, Pt. on medications and Pt. on home beta blockers + Past MI and +CHF   Rhythm:Regular Rate:Normal     Neuro/Psych  Headaches, negative psych ROS   GI/Hepatic Neg liver ROS, GERD  Medicated and Controlled,  Endo/Other  diabetesHypothyroidism   Renal/GU ESRFRenal disease  negative genitourinary   Musculoskeletal  (+) Arthritis , Osteoarthritis,    Abdominal   Peds  Hematology  (+) Blood dyscrasia, anemia ,   Anesthesia Other Findings   Reproductive/Obstetrics negative OB ROS                           Anesthesia Physical Anesthesia Plan  ASA: III  Anesthesia Plan: MAC   Post-op Pain Management:    Induction: Intravenous  PONV Risk Score and Plan: 3 and Ondansetron, Dexamethasone and Propofol infusion  Airway Management Planned: Simple Face Mask  Additional Equipment:   Intra-op Plan:   Post-operative Plan:   Informed Consent: I have reviewed the patients History and Physical, chart, labs and discussed the procedure including the risks, benefits and alternatives for the proposed anesthesia with the patient or authorized representative who has indicated his/her understanding and acceptance.     Dental advisory given  Plan Discussed with: CRNA  Anesthesia Plan Comments: (See PAT note by Karoline Caldwell, PA-C )       Anesthesia Quick Evaluation

## 2018-06-11 NOTE — Progress Notes (Signed)
Anesthesia Chart Review: SAME DAY WORKUP   Case:  [id number] Date/Time:  06/12/18 0911   Procedure:  FIRST STAGE BASCILIC VEIN TRANSPOSITION RIGHT ARM (Right )   Anesthesia type:  Monitor Anesthesia Care   Pre-op diagnosis:  CHRONIC KIDNEY DISEASE   Location:  [facility] ROOM 11 / [facility]   Surgeon:  Rosetta Posner, MD      DISCUSSION: 78 yo female never smoker. 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  Pt follows with Dr. Harl Bowie for hx of NSTEMI. Per his last OV note 02/18/2018: "- 2 admissions over the last few months with elevated troponins in setting of severe anemia - her initial NSTEMIadmission was at [facility] treated medically including with DAPT, primarily due to advanced renal dysfunction and anemia. Peak troponin 8.5. - readmitted 10/2016 with Hgb of 4.4 , elevated troponin again. DAPT stopped. Peak trop 1.8. 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 Her renal function and recurrent anemia has prohibited ischemic testing. - no recent symptoms, continue current meds. Intolerant to statins, f/u pcp labs to see if alternative is indicated "  Echo 10/17/17 showed EF 60-65%, moderate LVH, grade 2 dd, muld pulm HTN, no significant valvular disease.  Pt receives regular Retacrit injections for treatment of anemia.  Will need DOS eval by assigned anesthesiologist. Anticipate she can proceed as planned barring acute status change.   VS: There were no vitals taken for this visit.  PROVIDERS: Doree Albee, MD is PCP  Kerry Hough, MD is Cardiologist  Fran Lowes, MD is Nephrologist  LABS: Will need DOS labs  Labs Reviewed - No data to display   IMAGES: CHEST - 2 VIEW 10/17/2017  COMPARISON:  03/10/2017  FINDINGS: Cardiomegaly appears similar. There is moderate interstitial edema. Small bilateral pleural  effusions, left greater than right. Associated bibasilar opacities, with increased retrocardiac opacity on the lateral view. No pneumothorax. Postsurgical change in the left proximal humerus.  IMPRESSION: 1. CHF with pulmonary edema and small pleural effusions, left greater than right. Cardiomegaly is similar. 2. Bibasilar opacities, favor compressive atelectasis related to pleural effusion, superimposed pneumonia is also considered.   EKG: 10/17/2017: Sinus rhythm. Rate 94.  Abnormal R-wave progression, early transition. Borderline repolarization abnormality  CV: Carotid US 04/02/2018: Summary: Right Carotid: Velocities in the right ICA are consistent with a 1-39% stenosis.                The ECA appears >50% stenosed.  Left Carotid: Velocities in the left ICA are consistent with a 1-39% stenosis.               The ECA appears >50% stenosed.  Vertebrals:  Bilateral vertebral arteries demonstrate antegrade flow. Subclavians: Normal flow hemodynamics were seen in bilateral subclavian              arteries.  TTE 10/17/2017: Study Conclusions  - Left ventricle: The cavity size was normal. There was moderate   concentric hypertrophy. Systolic function was normal. The   estimated ejection fraction was in the range of 60% to 65%.   Images were inadequate for LV wall motion assessment. Features   are consistent with a pseudonormal left ventricular filling   pattern, with concomitant abnormal relaxation and increased   filling pressure (grade 2 diastolic dysfunction). Doppler   parameters are consistent with high ventricular filling pressure. -  Aortic valve: Poorly visualized. Valve area (VTI): 2.38 cm^2.   Valve area (Vmax): 2.16 cm^2. Valve area (Vmean): 2.26 cm^2. - Mitral valve: Calcified annulus. Moderate focal calcification of   the anterior leaflet (medial segment(s)). - Left atrium: The atrium was mildly dilated. - Atrial septum: There was increased thickness of the  septum,   consistent with lipomatous hypertrophy. - Tricuspid valve: There was trivial regurgitation. - Pulmonary arteries: PA peak pressure: 38 mm Hg (S).  Impressions:  - COmpared to prior study, PASP is slightly increased from 35 to   52mmHg. The right ventricular systolic pressure was increased   consistent with mild pulmonary hypertension.  Past Medical History:  Diagnosis Date  . Anemia   . Arthritis   . Blood transfusion without reported diagnosis   . Cataract   . Chronic anemia   . Chronic combined systolic and diastolic CHF (congestive heart failure) ([place])    a. 2D echo 08/2016 at [facility]: EF 50-55% with inferior wall HK, impaired LV filling, fair study.  . CKD (chronic kidney disease), stage III ([place])   . Diabetes ([place])   . Gastritis   . GERD (gastroesophageal reflux disease)   . Gout   . HTN (hypertension)   . Hyperlipidemia   . Hypothyroidism   . Iron deficiency anemia 11/08/2016  . Normocytic anemia 10/26/2016  . NSTEMI (non-ST elevated myocardial infarction) ([place])    a. Complex admission 08/2016 - with severe hyperglycemia, AKI on CKD, severe anemia down to Hgb 6.8, acute combined CHF, troponin of 8.5, cath deferred due to renal dysfunction.  . Thyroid disease     Past Surgical History:  Procedure Laterality Date  . COLONOSCOPY WITH PROPOFOL N/A 10/08/2016   5 mm transverse colon polyp note resected due to plavix. hemorrhoids  . ENTEROSCOPY N/A 08/08/2017   Procedure: ENTEROSCOPY;  Surgeon: Daneil Dolin, MD;  Location[name];  Service: Endoscopy;  Laterality: N/A;  . ESOPHAGOGASTRODUODENOSCOPY N/A 10/06/2016   mild chroni gastritis, negative H.pylori  . ESOPHAGOGASTRODUODENOSCOPY (EGD) WITH PROPOFOL N/A 03/12/2017   mild edema/erythema of stomach, small bowel biopsy with focal villous tip lymphocytosis, ?partially developed celiac  . 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 GIVENS CAPSULE STUDY  10/08/2016   normal  . GIVENS CAPSULE STUDY N/A 10/29/2016   occasional gastric erosion, unremarkable small bowel  . ORIF HUMERUS FRACTURE Left 02/07/2015   Procedure: OPEN REDUCTION INTERNAL FIXATION (ORIF) PROXIMAL HUMERUS FRACTURE;  Surgeon: Marybelle Killings, MD;  Location: [facility];  Service: Orthopedics;  Laterality: Left;  . THYROID SURGERY    . TOTAL HIP ARTHROPLASTY Left 02/07/2015   Procedure: TOTAL HIP ARTHROPLASTY ANTERIOR APPROACH ;  Surgeon: Marybelle Killings, MD;  Location: [place];  Service: Orthopedics;  Laterality: Left;  . WRIST SURGERY Left     MEDICATIONS: No current facility-administered medications for this encounter.    Marland Kitchen acetaminophen (TYLENOL) 325 MG tablet  . amLODipine (NORVASC) 10 MG tablet  . Calcium-Magnesium-Zinc (CAL-MAG-ZINC PO)  . Cholecalciferol (VITAMIN D-3) 125 MCG (5000 UT) TABS  . docusate sodium (COLACE) 100 MG capsule  . furosemide (LASIX) 20 MG tablet  . gabapentin (NEURONTIN) 300 MG capsule  . hydrALAZINE (APRESOLINE) 25 MG tablet  . metoprolol tartrate (LOPRESSOR) 25 MG tablet  . Multiple Vitamin (MULTIVITAMIN WITH MINERALS) TABS tablet  . ondansetron (  ZOFRAN) 4 MG tablet  . pantoprazole (PROTONIX) 40 MG tablet  . sodium bicarbonate 650 MG tablet  . thyroid (ARMOUR) 60 MG tablet  . ULORIC 40 MG tablet  . Alcohol Swabs (B-D SINGLE USE SWABS REGULAR) PADS  . glucose blood test strip  . levothyroxine (SYNTHROID, LEVOTHROID) 50 MCG tablet  . sodium bicarbonate 650 MG tablet   . Darbepoetin Alfa (ARANESP) injection 200 mcg      Wynonia Musty [facility] Short Stay Center/Anesthesiology Phone [phone] 06/11/2018 1:56 PM

## 2018-06-12 ENCOUNTER — Encounter (HOSPITAL_COMMUNITY): Payer: Self-pay

## 2018-06-12 ENCOUNTER — Ambulatory Visit (HOSPITAL_COMMUNITY): Payer: Medicare HMO | Admitting: Physician Assistant

## 2018-06-12 ENCOUNTER — Encounter (HOSPITAL_COMMUNITY)
Admission: RE | Disposition: A | Discharge status: Home / Self Care | Payer: Self-pay | Source: Home / Self Care | Attending: Vascular Surgery

## 2018-06-12 ENCOUNTER — Ambulatory Visit (HOSPITAL_COMMUNITY)
Admission: RE | Admit: 2018-06-12 | Discharge: 2018-06-12 | Disposition: A | Discharge status: Home / Self Care | Payer: Medicare HMO | Attending: Vascular Surgery | Admitting: Vascular Surgery

## 2018-06-12 DIAGNOSIS — K2970 Gastritis, unspecified, without bleeding: Secondary | ICD-10-CM | POA: Diagnosis not present

## 2018-06-12 DIAGNOSIS — I5042 Chronic combined systolic (congestive) and diastolic (congestive) heart failure: Secondary | ICD-10-CM | POA: Insufficient documentation

## 2018-06-12 DIAGNOSIS — N183 Chronic kidney disease, stage 3 (moderate): Secondary | ICD-10-CM | POA: Insufficient documentation

## 2018-06-12 DIAGNOSIS — E1122 Type 2 diabetes mellitus with diabetic chronic kidney disease: Secondary | ICD-10-CM | POA: Diagnosis not present

## 2018-06-12 DIAGNOSIS — N185 Chronic kidney disease, stage 5: Secondary | ICD-10-CM | POA: Diagnosis not present

## 2018-06-12 DIAGNOSIS — Z825 Family history of asthma and other chronic lower respiratory diseases: Secondary | ICD-10-CM | POA: Insufficient documentation

## 2018-06-12 DIAGNOSIS — Z992 Dependence on renal dialysis: Secondary | ICD-10-CM | POA: Insufficient documentation

## 2018-06-12 DIAGNOSIS — Z79899 Other long term (current) drug therapy: Secondary | ICD-10-CM | POA: Insufficient documentation

## 2018-06-12 DIAGNOSIS — I130 Hypertensive heart and chronic kidney disease with heart failure and stage 1 through stage 4 chronic kidney disease, or unspecified chronic kidney disease: Secondary | ICD-10-CM | POA: Diagnosis not present

## 2018-06-12 DIAGNOSIS — J90 Pleural effusion, not elsewhere classified: Secondary | ICD-10-CM | POA: Insufficient documentation

## 2018-06-12 DIAGNOSIS — E039 Hypothyroidism, unspecified: Secondary | ICD-10-CM | POA: Insufficient documentation

## 2018-06-12 DIAGNOSIS — D631 Anemia in chronic kidney disease: Secondary | ICD-10-CM | POA: Insufficient documentation

## 2018-06-12 DIAGNOSIS — R51 Headache: Secondary | ICD-10-CM | POA: Diagnosis not present

## 2018-06-12 DIAGNOSIS — Z833 Family history of diabetes mellitus: Secondary | ICD-10-CM | POA: Diagnosis not present

## 2018-06-12 DIAGNOSIS — Z888 Allergy status to other drugs, medicaments and biological substances status: Secondary | ICD-10-CM | POA: Insufficient documentation

## 2018-06-12 DIAGNOSIS — M109 Gout, unspecified: Secondary | ICD-10-CM | POA: Diagnosis not present

## 2018-06-12 DIAGNOSIS — I252 Old myocardial infarction: Secondary | ICD-10-CM | POA: Diagnosis not present

## 2018-06-12 DIAGNOSIS — M1990 Unspecified osteoarthritis, unspecified site: Secondary | ICD-10-CM | POA: Diagnosis not present

## 2018-06-12 DIAGNOSIS — N184 Chronic kidney disease, stage 4 (severe): Secondary | ICD-10-CM

## 2018-06-12 DIAGNOSIS — N189 Chronic kidney disease, unspecified: Secondary | ICD-10-CM | POA: Diagnosis not present

## 2018-06-12 DIAGNOSIS — I129 Hypertensive chronic kidney disease with stage 1 through stage 4 chronic kidney disease, or unspecified chronic kidney disease: Secondary | ICD-10-CM | POA: Diagnosis not present

## 2018-06-12 DIAGNOSIS — E785 Hyperlipidemia, unspecified: Secondary | ICD-10-CM | POA: Diagnosis not present

## 2018-06-12 DIAGNOSIS — K219 Gastro-esophageal reflux disease without esophagitis: Secondary | ICD-10-CM | POA: Insufficient documentation

## 2018-06-12 HISTORY — DX: Headache, unspecified: R51.9

## 2018-06-12 HISTORY — DX: Headache: R51

## 2018-06-12 HISTORY — PX: BASCILIC VEIN TRANSPOSITION: SHX5742

## 2018-06-12 HISTORY — DX: Presence of dental prosthetic device (complete) (partial): Z97.2

## 2018-06-12 LAB — GLUCOSE, CAPILLARY: Glucose-Capillary: 95 mg/dL (ref 70–99)

## 2018-06-12 LAB — POCT I-STAT 4, (NA,K, GLUC, HGB,HCT)
Glucose, Bld: 111 mg/dL — ABNORMAL HIGH (ref 70–99)
HCT: 29 % — ABNORMAL LOW (ref 36.0–46.0)
Hemoglobin: 9.9 g/dL — ABNORMAL LOW (ref 12.0–15.0)
Potassium: 4.5 mmol/L (ref 3.5–5.1)
Sodium: 141 mmol/L (ref 135–145)

## 2018-06-12 SURGERY — TRANSPOSITION, VEIN, BASILIC
Anesthesia: Monitor Anesthesia Care | Site: Arm Upper | Laterality: Right

## 2018-06-12 MED ORDER — SODIUM CHLORIDE 0.9 % IV SOLN
INTRAVENOUS | Status: DC
  Administered 2018-06-12: 10:00:00 via INTRAVENOUS

## 2018-06-12 MED ORDER — CEFAZOLIN SODIUM-DEXTROSE 2-4 GM/100ML-% IV SOLN
2.0000 g | INTRAVENOUS | Status: AC
  Administered 2018-06-12: 2 g via INTRAVENOUS
  Filled 2018-06-12: qty 100

## 2018-06-12 MED ORDER — PROPOFOL 10 MG/ML IV BOLUS
INTRAVENOUS | Status: AC
  Filled 2018-06-12: qty 20

## 2018-06-12 MED ORDER — LIDOCAINE HCL (CARDIAC) PF 100 MG/5ML IV SOSY
PREFILLED_SYRINGE | INTRAVENOUS | Status: DC | PRN
  Administered 2018-06-12: 100 mg via INTRAVENOUS

## 2018-06-12 MED ORDER — LIDOCAINE-EPINEPHRINE 0.5 %-1:200000 IJ SOLN
INTRAMUSCULAR | Status: DC | PRN
  Administered 2018-06-12: 30 mL

## 2018-06-12 MED ORDER — SODIUM CHLORIDE 0.9 % IV SOLN
INTRAVENOUS | Status: AC
  Filled 2018-06-12: qty 1.2

## 2018-06-12 MED ORDER — CHLORHEXIDINE GLUCONATE CLOTH 2 % EX PADS: 6.0000 | MEDICATED_PAD | Freq: Once | CUTANEOUS | Status: DC

## 2018-06-12 MED ORDER — LIDOCAINE-EPINEPHRINE 0.5 %-1:200000 IJ SOLN
INTRAMUSCULAR | Status: AC
  Filled 2018-06-12: qty 1

## 2018-06-12 MED ORDER — DEXAMETHASONE SODIUM PHOSPHATE 4 MG/ML IJ SOLN
INTRAMUSCULAR | Status: DC | PRN
  Administered 2018-06-12: 5 mg via INTRAVENOUS

## 2018-06-12 MED ORDER — 0.9 % SODIUM CHLORIDE (POUR BTL) OPTIME
TOPICAL | Status: DC | PRN
  Administered 2018-06-12: 1000 mL

## 2018-06-12 MED ORDER — PROPOFOL 1000 MG/100ML IV EMUL
INTRAVENOUS | Status: AC
  Filled 2018-06-12: qty 300

## 2018-06-12 MED ORDER — OXYCODONE-ACETAMINOPHEN 5-325 MG PO TABS: 1.0000 | ORAL_TABLET | Freq: Four times a day (QID) | ORAL | 0 refills | Status: DC | PRN

## 2018-06-12 MED ORDER — FENTANYL CITRATE (PF) 250 MCG/5ML IJ SOLN
INTRAMUSCULAR | Status: AC
  Filled 2018-06-12: qty 5

## 2018-06-12 MED ORDER — PROPOFOL 10 MG/ML IV BOLUS
INTRAVENOUS | Status: DC | PRN
  Administered 2018-06-12: 20 mg via INTRAVENOUS

## 2018-06-12 MED ORDER — FENTANYL CITRATE (PF) 100 MCG/2ML IJ SOLN
INTRAMUSCULAR | Status: DC | PRN
  Administered 2018-06-12: 25 ug via INTRAVENOUS

## 2018-06-12 MED ORDER — SODIUM CHLORIDE 0.9 % IV SOLN
INTRAVENOUS | Status: DC | PRN
  Administered 2018-06-12: 10:00:00

## 2018-06-12 MED ORDER — PROPOFOL 500 MG/50ML IV EMUL
INTRAVENOUS | Status: DC | PRN
  Administered 2018-06-12: 75 ug/kg/min via INTRAVENOUS

## 2018-06-12 MED ORDER — ONDANSETRON HCL 4 MG/2ML IJ SOLN
INTRAMUSCULAR | Status: DC | PRN
  Administered 2018-06-12: 4 mg via INTRAVENOUS

## 2018-06-12 SURGICAL SUPPLY — 28 items
ARMBAND PINK RESTRICT EXTREMIT (MISCELLANEOUS) ×3 IMPLANT
CANISTER SUCT 3000ML PPV (MISCELLANEOUS) ×3 IMPLANT
CANNULA VESSEL 3MM 2 BLNT TIP (CANNULA) ×3 IMPLANT
CLIP LIGATING EXTRA MED SLVR (CLIP) ×3 IMPLANT
CLIP LIGATING EXTRA SM BLUE (MISCELLANEOUS) ×3 IMPLANT
COVER PROBE W GEL 5X96 (DRAPES) ×3 IMPLANT
COVER WAND RF STERILE (DRAPES) ×3 IMPLANT
DECANTER SPIKE VIAL GLASS SM (MISCELLANEOUS) ×3 IMPLANT
DERMABOND ADVANCED (GAUZE/BANDAGES/DRESSINGS) ×2
DERMABOND ADVANCED .7 DNX12 (GAUZE/BANDAGES/DRESSINGS) ×1 IMPLANT
ELECT REM PT RETURN 9FT ADLT (ELECTROSURGICAL) ×3
ELECTRODE REM PT RTRN 9FT ADLT (ELECTROSURGICAL) ×1 IMPLANT
GLOVE SS BIOGEL STRL SZ 7.5 (GLOVE) ×1 IMPLANT
GLOVE SUPERSENSE BIOGEL SZ 7.5 (GLOVE) ×2
GOWN STRL REUS W/ TWL LRG LVL3 (GOWN DISPOSABLE) ×3 IMPLANT
GOWN STRL REUS W/TWL LRG LVL3 (GOWN DISPOSABLE) ×6
KIT BASIN OR (CUSTOM PROCEDURE TRAY) ×3 IMPLANT
KIT TURNOVER KIT B (KITS) ×3 IMPLANT
NS IRRIG 1000ML POUR BTL (IV SOLUTION) ×3 IMPLANT
PACK CV ACCESS (CUSTOM PROCEDURE TRAY) ×3 IMPLANT
PAD ARMBOARD 7.5X6 YLW CONV (MISCELLANEOUS) ×6 IMPLANT
SUT PROLENE 6 0 CC (SUTURE) ×3 IMPLANT
SUT SILK 2 0 SH (SUTURE) IMPLANT
SUT VIC AB 3-0 SH 27 (SUTURE) ×2
SUT VIC AB 3-0 SH 27X BRD (SUTURE) ×1 IMPLANT
TOWEL GREEN STERILE (TOWEL DISPOSABLE) ×3 IMPLANT
UNDERPAD 30X30 (UNDERPADS AND DIAPERS) ×3 IMPLANT
WATER STERILE IRR 1000ML POUR (IV SOLUTION) ×3 IMPLANT

## 2018-06-12 NOTE — Anesthesia Postprocedure Evaluation (Signed)
Anesthesia Post Note  Patient: [name]  Procedure(s) Performed: FIRST STAGE BASCILIC VEIN TRANSPOSITION RIGHT ARM (Right Arm Upper)     Patient location during evaluation: PACU Anesthesia Type: MAC Level of consciousness: awake and alert Pain management: pain level controlled Vital Signs Assessment: post-procedure vital signs reviewed and stable Respiratory status: spontaneous breathing, nonlabored ventilation and respiratory function stable Cardiovascular status: stable and blood pressure returned to baseline Postop Assessment: no apparent nausea or vomiting Anesthetic complications: no    Last Vitals:  Vitals:   06/12/18 1122 06/12/18 1132  BP: (!) 126/49 (!) 135/53  Pulse:    Resp:    Temp: 36.5 C   SpO2:      Last Pain:  Vitals:   06/12/18 0730  PainSc: 0-No pain                 Nephtali Docken,W. EDMOND

## 2018-06-12 NOTE — Transfer of Care (Signed)
Immediate Anesthesia Transfer of Care Note  Patient: [name]  Procedure(s) Performed: FIRST STAGE BASCILIC VEIN TRANSPOSITION RIGHT ARM (Right Arm Upper)  Patient Location: PACU  Anesthesia Type:MAC  Level of Consciousness: awake, alert  and oriented  Airway & Oxygen Therapy: Patient Spontanous Breathing  Post-op Assessment: Report given to RN, Post -op Vital signs reviewed and stable and Patient moving all extremities  Post vital signs: Reviewed and stable  Last Vitals:  Vitals Value Taken Time  BP 126/49 06/12/2018 11:22 AM  Temp    Pulse 72 06/12/2018 11:25 AM  Resp 14 06/12/2018 11:25 AM  SpO2 96 % 06/12/2018 11:25 AM  Vitals shown include unvalidated device data.  Last Pain:  Vitals:   06/12/18 0730  PainSc: 0-No pain         Complications: No apparent anesthesia complications

## 2018-06-12 NOTE — Op Note (Signed)
    OPERATIVE REPORT  DATE OF SURGERY: 06/12/2018  PATIENT: [name], 78 y.o. female MRN: [id number]  DOB: [birth year]  PRE-OPERATIVE DIAGNOSIS: Chronic renal insufficiency  POST-OPERATIVE DIAGNOSIS:  Same  PROCEDURE: Right first stage brachial artery to basilic vein fistula  SURGEON:  Curt Jews, M.D.  PHYSICIAN ASSISTANT: Liana Crocker, PA-C  ANESTHESIA: Local with sedation  EBL: per anesthesia record  No intake/output data recorded.  BLOOD ADMINISTERED: none  DRAINS: none  SPECIMEN: none  COUNTS CORRECT:  YES  PATIENT DISPOSITION:  PACU - hemodynamically stable  PROCEDURE DETAILS: Patient was taken to the operative placed supine position where the area of the right arm was prepped draped you sterile fashion.  SonoSite ultrasound was used to visualize the basilic vein which was of good caliber.  The patient was made over the antecubital space and carried and isolate the brachial artery which was of large caliber and the basilic vein which was also large caliber.  The vein was ligated distally and divided and was gently dilated and was found to be of good caliber.  The brachial artery was occluded proximally distally and was opened 11 blade simultaneous Potts scissors.  Small arteriotomy was created.  The vein was spatulated and sewn end-to-side to the artery with a running 6-0 Prolene suture.  Clamps removed and excellent thrill was noted.  Patient did have a large branch just above the incision and this was ligated with 3-0 silk ties and divided.  Wounds irrigated with saline.  Hemostasis electrocautery.  The patient had a good radial pulse.  Wound was closed with 3-0 Vicryl in the subcutaneous and subcuticular tissue.  Sterile dressing was applied the patient was transferred to the recovery room in stable condition   Rosetta Posner, M.D., [facility] 06/12/2018 11:22 AM

## 2018-06-12 NOTE — Interval H&P Note (Signed)
History and Physical Interval Note:  06/12/2018 9:35 AM  [name]  has presented today for surgery, with the diagnosis of CHRONIC KIDNEY DISEASE  The various methods of treatment have been discussed with the patient and family. After consideration of risks, benefits and other options for treatment, the patient has consented to  Procedure(s): [place] (Right) as a surgical intervention .  The patient's history has been reviewed, patient examined, no change in status, stable for surgery.  I have reviewed the patient's chart and labs.  Questions were answered to the patient's satisfaction.     Curt Jews

## 2018-06-12 NOTE — Discharge Instructions (Signed)
° °  [facility]  Discharge Instructions  AV Fistula or Graft Surgery for Dialysis Access  Please refer to the following instructions for your post-procedure care. Your surgeon or physician assistant will discuss any changes with you.  Activity  You may drive the day following your surgery, if you are comfortable and no longer taking prescription pain medication. Resume full activity as the soreness in your incision resolves.  Bathing/Showering  You may shower after you go home. Keep your incision dry for 48 hours. Do not soak in a bathtub, hot tub, or swim until the incision heals completely. You may not shower if you have a hemodialysis catheter.  Incision Care  Clean your incision with mild soap and water after 48 hours. Pat the area dry with a clean towel. You do not need a bandage unless otherwise instructed. Do not apply any ointments or creams to your incision. You may have skin glue on your incision. Do not peel it off. It will come off on its own in about one week. Your arm may swell a bit after surgery. To reduce swelling use pillows to elevate your arm so it is above your heart. Your doctor will tell you if you need to lightly wrap your arm with an ACE bandage.  Diet  Resume your normal diet. There are not special food restrictions following this procedure. In order to heal from your surgery, it is CRITICAL to get adequate nutrition. Your body requires vitamins, minerals, and protein. Vegetables are the best source of vitamins and minerals. Vegetables also provide the perfect balance of protein. Processed food has little nutritional value, so try to avoid this.  Medications  Resume taking all of your medications. If your incision is causing pain, you may take over-the counter pain relievers such as acetaminophen (Tylenol). If you were prescribed a stronger pain medication, please be aware these medications can cause nausea and constipation. Prevent  nausea by taking the medication with a snack or meal. Avoid constipation by drinking plenty of fluids and eating foods with high amount of fiber, such as fruits, vegetables, and grains.  Do not take Tylenol if you are taking prescription pain medications.  Follow up Your surgeon may want to see you in the office following your access surgery. If so, this will be arranged at the time of your surgery.  Please call us immediately for any of the following conditions:  Increased pain, redness, drainage (pus) from your incision site Fever of 101 degrees or higher Severe or worsening pain at your incision site Hand pain or numbness.  Reduce your risk of vascular disease:  Stop smoking. If you would like help, call [facility] at 1-800-QUIT-NOW 785-042-2902) or [place] at [place] your cholesterol Maintain a desired weight Control your diabetes Keep your blood pressure down  Dialysis  It will take several weeks to several months for your new dialysis access to be ready for use. Your surgeon will determine when it is okay to use it. Your nephrologist will continue to direct your dialysis. You can continue to use your Permcath until your new access is ready for use.   06/12/2018 [name] [id number] 14-Feb-1941  Surgeon(s): Early, Arvilla Meres, MD  Procedure(s): FIRST STAGE BASILIC VEIN TRANSPOSITION RIGHT ARM  x Do not stick fistula for 12 weeks    If you have any questions, please call the office at [phone].

## 2018-06-13 ENCOUNTER — Encounter (HOSPITAL_COMMUNITY): Payer: Self-pay | Admitting: Vascular Surgery

## 2018-07-01 DIAGNOSIS — L97511 Non-pressure chronic ulcer of other part of right foot limited to breakdown of skin: Secondary | ICD-10-CM | POA: Diagnosis not present

## 2018-07-08 ENCOUNTER — Other Ambulatory Visit (HOSPITAL_COMMUNITY): Payer: Medicare HMO

## 2018-07-08 ENCOUNTER — Ambulatory Visit (HOSPITAL_COMMUNITY): Payer: Medicare HMO

## 2018-07-08 ENCOUNTER — Inpatient Hospital Stay (HOSPITAL_COMMUNITY): Payer: Medicare HMO

## 2018-07-08 ENCOUNTER — Inpatient Hospital Stay (HOSPITAL_COMMUNITY): Payer: Medicare HMO | Attending: Hematology

## 2018-07-08 ENCOUNTER — Other Ambulatory Visit: Payer: Self-pay

## 2018-07-08 ENCOUNTER — Ambulatory Visit (HOSPITAL_COMMUNITY): Payer: Medicare HMO | Admitting: Hematology

## 2018-07-08 ENCOUNTER — Inpatient Hospital Stay (HOSPITAL_BASED_OUTPATIENT_CLINIC_OR_DEPARTMENT_OTHER): Payer: Medicare HMO | Admitting: Hematology

## 2018-07-08 ENCOUNTER — Encounter (HOSPITAL_COMMUNITY): Payer: Self-pay | Admitting: Hematology

## 2018-07-08 VITALS — BP 146/56 | HR 74 | Temp 98.3°F | Resp 16 | Wt 175.2 lb

## 2018-07-08 DIAGNOSIS — N184 Chronic kidney disease, stage 4 (severe): Secondary | ICD-10-CM

## 2018-07-08 DIAGNOSIS — D649 Anemia, unspecified: Secondary | ICD-10-CM

## 2018-07-08 DIAGNOSIS — N183 Chronic kidney disease, stage 3 (moderate): Secondary | ICD-10-CM | POA: Insufficient documentation

## 2018-07-08 DIAGNOSIS — E611 Iron deficiency: Secondary | ICD-10-CM

## 2018-07-08 DIAGNOSIS — D631 Anemia in chronic kidney disease: Secondary | ICD-10-CM

## 2018-07-08 DIAGNOSIS — Z79899 Other long term (current) drug therapy: Secondary | ICD-10-CM

## 2018-07-08 DIAGNOSIS — D638 Anemia in other chronic diseases classified elsewhere: Secondary | ICD-10-CM

## 2018-07-08 LAB — CBC WITH DIFFERENTIAL/PLATELET
Abs Immature Granulocytes: 0.03 10*3/uL (ref 0.00–0.07)
BASOS PCT: 1 %
Basophils Absolute: 0.1 10*3/uL (ref 0.0–0.1)
EOS ABS: 0.5 10*3/uL (ref 0.0–0.5)
Eosinophils Relative: 7 %
HCT: 31.6 % — ABNORMAL LOW (ref 36.0–46.0)
Hemoglobin: 10 g/dL — ABNORMAL LOW (ref 12.0–15.0)
Immature Granulocytes: 0 %
Lymphocytes Relative: 20 %
Lymphs Abs: 1.5 10*3/uL (ref 0.7–4.0)
MCH: 28.7 pg (ref 26.0–34.0)
MCHC: 31.6 g/dL (ref 30.0–36.0)
MCV: 90.8 fL (ref 80.0–100.0)
Monocytes Absolute: 0.5 10*3/uL (ref 0.1–1.0)
Monocytes Relative: 7 %
Neutro Abs: 4.8 10*3/uL (ref 1.7–7.7)
Neutrophils Relative %: 65 %
Platelets: 314 10*3/uL (ref 150–400)
RBC: 3.48 MIL/uL — ABNORMAL LOW (ref 3.87–5.11)
RDW: 14.6 % (ref 11.5–15.5)
WBC: 7.4 10*3/uL (ref 4.0–10.5)
nRBC: 0 % (ref 0.0–0.2)

## 2018-07-08 LAB — COMPREHENSIVE METABOLIC PANEL
ALT: 11 U/L (ref 0–44)
AST: 14 U/L — AB (ref 15–41)
Albumin: 3.5 g/dL (ref 3.5–5.0)
Alkaline Phosphatase: 66 U/L (ref 38–126)
Anion gap: 11 (ref 5–15)
BUN: 53 mg/dL — AB (ref 8–23)
CO2: 18 mmol/L — ABNORMAL LOW (ref 22–32)
Calcium: 8.6 mg/dL — ABNORMAL LOW (ref 8.9–10.3)
Chloride: 110 mmol/L (ref 98–111)
Creatinine, Ser: 3.01 mg/dL — ABNORMAL HIGH (ref 0.44–1.00)
GFR calc Af Amer: 16 mL/min — ABNORMAL LOW (ref >60)
GFR calc non Af Amer: 14 mL/min — ABNORMAL LOW (ref >60)
GLUCOSE: 112 mg/dL — AB (ref 70–99)
Potassium: 4.3 mmol/L (ref 3.5–5.1)
Sodium: 139 mmol/L (ref 135–145)
Total Bilirubin: 0.3 mg/dL (ref 0.3–1.2)
Total Protein: 6.7 g/dL (ref 6.5–8.1)

## 2018-07-08 LAB — LACTATE DEHYDROGENASE: LDH: 145 U/L (ref 98–192)

## 2018-07-08 MED ORDER — EPOETIN ALFA-EPBX 40000 UNIT/ML IJ SOLN
40000.0000 [IU] | Freq: Once | INTRAMUSCULAR | Status: AC
  Administered 2018-07-08: 40000 [IU] via SUBCUTANEOUS
  Filled 2018-07-08: qty 1

## 2018-07-08 NOTE — Patient Instructions (Signed)
[place] at [facility] Discharge Instructions  Received Retacrit injection today. Follow-up as scheduled. Call clinic for any questions or concerns   Thank you for choosing [facility] to provide your oncology and hematology care.  To afford each patient quality time with our provider, please arrive at least 15 minutes before your scheduled appointment time.   If you have a lab appointment with the [place] please come in thru the  Main Entrance and check in at the main information desk  You need to re-schedule your appointment should you arrive 10 or more minutes late.  We strive to give you quality time with our providers, and arriving late affects you and other patients whose appointments are after yours.  Also, if you no show three or more times for appointments you may be dismissed from the clinic at the providers discretion.     Again, thank you for choosing [facility].  Our hope is that these requests will decrease the amount of time that you wait before being seen by our physicians.       _____________________________________________________________  Should you have questions after your visit to [facility], please contact our office at [phone] between the hours of 8:00 a.m. and 4:30 p.m.  Voicemails left after 4:00 p.m. will not be returned until the following business day.  For prescription refill requests, have your pharmacy contact our office and allow 72 hours.    Cancer Center Support Programs:   > Cancer Support Group  2nd Tuesday of the month 1pm-2pm, Journey Room

## 2018-07-08 NOTE — Assessment & Plan Note (Signed)
1. Normocytic anemia: - Secondary to CKD and iron deficiency. -Denies any bleeding per rectum or melena.  She has been off of aspirin and Plavix.   - She was previously receiving Aranesp, which was changed to Retacrit on 02/14/2018.  - She received Feraheme on 03/22/2017 and 03/29/2017. - I have reviewed her labs.  Percent saturation is low at 18.  Ferritin was 175 in November. - I have recommended 2 weekly infusions of Feraheme to keep the percent saturation close to 30. - She will continue Retacrit injections. -I will reevaluate her in 3 months with repeat blood work.

## 2018-07-08 NOTE — Patient Instructions (Signed)
[facility] at [facility] Discharge Instructions  Follow up in 3 months with labs    Thank you for choosing [facility] to provide your oncology and hematology care.  To afford each patient quality time with our provider, please arrive at least 15 minutes before your scheduled appointment time.   If you have a lab appointment with the Cancer Center please come in thru the  Main Entrance and check in at the main information desk  You need to re-schedule your appointment should you arrive 10 or more minutes late.  We strive to give you quality time with our providers, and arriving late affects you and other patients whose appointments are after yours.  Also, if you no show three or more times for appointments you may be dismissed from the clinic at the providers discretion.     Again, thank you for choosing [facility].  Our hope is that these requests will decrease the amount of time that you wait before being seen by our physicians.       _____________________________________________________________  Should you have questions after your visit to [facility], please contact our office at [phone] between the hours of 8:00 a.m. and 4:30 p.m.  Voicemails left after 4:00 p.m. will not be returned until the following business day.  For prescription refill requests, have your pharmacy contact our office and allow 72 hours.    Cancer Center Support Programs:   > Cancer Support Group  2nd Tuesday of the month 1pm-2pm, Journey Room    

## 2018-07-08 NOTE — Progress Notes (Signed)
[name] tolerated Retacrit injection well without complaints or incident. Hgb 10 today VSS Pt discharged self ambulatory in satisfactory condition accompanied by family member

## 2018-07-08 NOTE — Progress Notes (Signed)
[place], [place] [address]   CLINIC:  Medical Oncology/Hematology  PCP:  Doree Albee, MD [place] [address] [phone]   REASON FOR VISIT: Follow-up for normocytic anemia   CURRENT THERAPY: Retacrit   INTERVAL HISTORY:  Ms. Frumkin 78 y.o. female returns for follow-up of her anemia.  She denies any bleeding per rectum or melena.  Denies any fevers, night sweats or weight loss.  Denies any recurrent infections or hospitalizations.  Tolerating erythropoiesis stimulating agent injections very well.  Reports appetite and energy at 100%.  Denies any chest pains or lightheadedness.  Is able to do all her ADLs and IADLs.   REVIEW OF SYSTEMS:  Review of Systems  All other systems reviewed and are negative.    PAST MEDICAL/SURGICAL HISTORY:  Past Medical History:  Diagnosis Date  . Anemia   . Arthritis   . Blood transfusion without reported diagnosis   . Cataract   . Chronic anemia   . Chronic combined systolic and diastolic CHF (congestive heart failure) ([place])    a. 2D echo 08/2016 at [facility]: EF 50-55% with inferior wall HK, impaired LV filling, fair study.  . CKD (chronic kidney disease), stage III ([place])   . Diabetes ([place])   . Gastritis   . GERD (gastroesophageal reflux disease)   . Gout   . Headache   . HTN (hypertension)   . Hyperlipidemia   . Hypothyroidism   . Iron deficiency anemia 11/08/2016  . Normocytic anemia 10/26/2016  . NSTEMI (non-ST elevated myocardial infarction) ([place])    a. Complex admission 08/2016 - with severe hyperglycemia, AKI on CKD, severe anemia down to Hgb 6.8, acute combined CHF, troponin of 8.5, cath deferred due to renal dysfunction.  . Thyroid disease   . Wears dentures    Past Surgical History:  Procedure Laterality Date  . BASCILIC VEIN TRANSPOSITION Right 06/12/2018   Procedure: FIRST STAGE BASCILIC VEIN TRANSPOSITION RIGHT ARM;  Surgeon: Rosetta Posner, MD;  Location: [place];   Service: Vascular;  Laterality: Right;  . CATARACT EXTRACTION     left  . COLONOSCOPY WITH PROPOFOL N/A 10/08/2016   5 mm transverse colon polyp note resected due to plavix. hemorrhoids  . ENTEROSCOPY N/A 08/08/2017   Procedure: ENTEROSCOPY;  Surgeon: Daneil Dolin, MD;  [name];  Service: Endoscopy;  Laterality: N/A;  . ESOPHAGOGASTRODUODENOSCOPY N/A 10/06/2016   mild chroni gastritis, negative H.pylori  . ESOPHAGOGASTRODUODENOSCOPY (EGD) WITH PROPOFOL N/A 03/12/2017   mild edema/erythema of stomach, small bowel biopsy with focal villous tip lymphocytosis, ?partially developed celiac  . 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 GIVENS CAPSULE STUDY  10/08/2016   normal  . GIVENS CAPSULE STUDY N/A 10/29/2016   occasional gastric erosion, unremarkable small bowel  . ORIF HUMERUS FRACTURE Left 02/07/2015   Procedure: OPEN REDUCTION INTERNAL FIXATION (ORIF) PROXIMAL HUMERUS FRACTURE;  Surgeon: Marybelle Killings, MD;  Location: [place];  Service: Orthopedics;  Laterality: Left;  . teeth extractions    . THYROID SURGERY    . TOTAL HIP ARTHROPLASTY Left 02/07/2015   Procedure: TOTAL HIP ARTHROPLASTY ANTERIOR APPROACH ;  Surgeon: Marybelle Killings, MD;  Location: [place];  Service: Orthopedics;  Laterality: Left;  . WRIST SURGERY Left  SOCIAL HISTORY:  Social History   Socioeconomic History  . Marital status: Widowed    Spouse name: [name]  . Number of children: 2  . Years of education: 38  . Highest education level: Not on file  Occupational History  . Occupation: retired  Scientific laboratory technician  . Financial resource strain: Not on file  . Food insecurity:    Worry: Not on file    Inability: Not on file  . Transportation needs:    Medical: Not on file     Non-medical: Not on file  Tobacco Use  . Smoking status: Never Smoker  . Smokeless tobacco: Never Used  Substance and Sexual Activity  . Alcohol use: No    Alcohol/week: 0.0 standard drinks  . Drug use: No  . Sexual activity: Not Currently  Lifestyle  . Physical activity:    Days per week: Not on file    Minutes per session: Not on file  . Stress: Not on file  Relationships  . Social connections:    Talks on phone: Not on file    Gets together: Not on file    Attends religious service: Not on file    Active member of club or organization: Not on file    Attends meetings of clubs or organizations: Not on file    Relationship status: Not on file  . Intimate partner violence:    Fear of current or ex partner: Not on file    Emotionally abused: Not on file    Physically abused: Not on file    Forced sexual activity: Not on file  Other Topics Concern  . Not on file  Social History Narrative   Lives with significant other/partner [name]   He is her caregiver   Moved from [facility]    FAMILY HISTORY:  Family History  Problem Relation Age of Onset  . Diabetes Mother   . Asthma Mother   . Early death Mother 13       Pneumonia  . Early death Father        Killed at rodeo  . CAD Neg Hx   . GI Bleed Neg Hx     CURRENT MEDICATIONS:  Outpatient Encounter Medications as of 07/08/2018  Medication Sig Note  . acetaminophen (TYLENOL) 325 MG tablet Take 2 tablets (650 mg total) by mouth every 6 (six) hours as needed for mild pain or headache (or Fever >/= 101).   . Alcohol Swabs (B-D SINGLE USE SWABS REGULAR) PADS    . amLODipine (NORVASC) 10 MG tablet Take 1 tablet (10 mg total) by mouth daily.   . Calcium-Magnesium-Zinc (CAL-MAG-ZINC PO) Take 1 tablet by mouth daily.   . Cholecalciferol (VITAMIN D-3) 125 MCG (5000 UT) TABS Take 5,000 mg by mouth daily.   Marland Kitchen docusate sodium (COLACE) 100 MG capsule Take 1 capsule (100 mg total) by mouth every 12 (twelve) hours. (Patient taking differently:  Take 100 mg by mouth at bedtime. )   . furosemide (LASIX) 20 MG tablet Take 1 tablet (20 mg total) by mouth as needed. Only has to take it when she gains 2 lbs (Patient taking differently: Take 20 mg by mouth daily as needed (for fluid retention/weight gain of 2 lbs). )   . gabapentin (NEURONTIN) 300 MG capsule Take 2 capsules (600 mg total) by mouth at bedtime.   Marland Kitchen glucose blood test strip 1 each by Other route 4 (four) times daily. Use as instructed bid. E11.65   . hydrALAZINE (APRESOLINE) 25 MG  tablet Take 1 tablet (25 mg total) by mouth 2 (two) times daily.   . metoprolol tartrate (LOPRESSOR) 25 MG tablet Take 0.5 tablets (12.5 mg total) by mouth 2 (two) times daily.   . Multiple Vitamin (MULTIVITAMIN WITH MINERALS) TABS tablet Take 1 tablet by mouth daily.   . NP THYROID 90 MG tablet Take 90 mg by mouth daily.   . ondansetron (ZOFRAN) 4 MG tablet Take 1 tablet (4 mg total) by mouth every 8 (eight) hours as needed for nausea or vomiting.   Marland Kitchen oxyCODONE-acetaminophen (PERCOCET) 5-325 MG tablet Take 1 tablet by mouth every 6 (six) hours as needed for severe pain.   . pantoprazole (PROTONIX) 40 MG tablet Take 1 tablet (40 mg total) by mouth 2 (two) times daily. 30 minutes before breakfast (Patient taking differently: Take 40 mg by mouth daily before breakfast. )   . sodium bicarbonate 650 MG tablet Take 650 mg by mouth 2 (two) times daily.   . sodium bicarbonate 650 MG tablet Take by mouth.   Marland Kitchen ULORIC 40 MG tablet Take 40 mg by mouth daily.    Marland Kitchen UNABLE TO FIND [place] 06/11/2018: Pt takes 10 units at bedtime  . [DISCONTINUED] thyroid (ARMOUR) 60 MG tablet Take 60 mg by mouth daily before breakfast.    Facility-Administered Encounter Medications as of 07/08/2018  Medication  . Darbepoetin Alfa (ARANESP) injection 200 mcg    ALLERGIES:  Allergies  Allergen Reactions  . Insulin Glargine Swelling    "Makes me swell like a balloon all over", including face, but without any respiratory distress or  rashes. Associated with weight gain.  . Statins Other (See Comments)    "I've tried them all; my muscle aches were so bad I couldn't walk".     PHYSICAL EXAM:  ECOG Performance status: 1  Vitals:   07/08/18 1343  BP: (!) 146/56  Pulse: 74  Resp: 16  Temp: 98.3 F (36.8 C)  SpO2: 100%   Filed Weights   07/08/18 1343  Weight: 175 lb 3.2 oz (79.5 kg)    Physical Exam Constitutional:      Appearance: She is well-developed.  Cardiovascular:     Rate and Rhythm: Normal rate and regular rhythm.     Heart sounds: Normal heart sounds.  Pulmonary:     Effort: Pulmonary effort is normal.     Breath sounds: Normal breath sounds.  Musculoskeletal: Normal range of motion.  Skin:    General: Skin is warm and dry.  Neurological:     Mental Status: She is alert and oriented to person, place, and time.  Psychiatric:        Behavior: Behavior normal.        Thought Content: Thought content normal.        Judgment: Judgment normal.      LABORATORY DATA:  I have reviewed the labs as listed.  CBC    Component Value Date/Time   WBC 7.4 07/08/2018 1308   RBC 3.48 (L) 07/08/2018 1308   HGB 10.0 (L) 07/08/2018 1308   HCT 31.6 (L) 07/08/2018 1308   PLT 314 07/08/2018 1308   MCV 90.8 07/08/2018 1308   MCH 28.7 07/08/2018 1308   MCHC 31.6 07/08/2018 1308   RDW 14.6 07/08/2018 1308   LYMPHSABS 1.5 07/08/2018 1308   MONOABS 0.5 07/08/2018 1308   EOSABS 0.5 07/08/2018 1308   BASOSABS 0.1 07/08/2018 1308   CMP Latest Ref Rng & Units 07/08/2018 06/12/2018 05/30/2018  Glucose 70 - 99 mg/dL  112(H) 111(H) 162(H)  BUN 8 - 23 mg/dL 53(H) - 61(H)  Creatinine 0.44 - 1.00 mg/dL 3.01(H) - 2.92(H)  Sodium 135 - 145 mmol/L 139 141 140  Potassium 3.5 - 5.1 mmol/L 4.3 4.5 4.6  Chloride 98 - 111 mmol/L 110 - 114(H)  CO2 22 - 32 mmol/L 18(L) - 16(L)  Calcium 8.9 - 10.3 mg/dL 8.6(L) - 8.5(L)  Total Protein 6.5 - 8.1 g/dL 6.7 - -  Total Bilirubin 0.3 - 1.2 mg/dL 0.3 - -  Alkaline Phos 38 - 126  U/L 66 - -  AST 15 - 41 U/L 14(L) - -  ALT 0 - 44 U/L 11 - -          ASSESSMENT & PLAN:   Normocytic anemia 1. Normocytic anemia: - Secondary to CKD and iron deficiency. -Denies any bleeding per rectum or melena.  She has been off of aspirin and Plavix.   - She was previously receiving Aranesp, which was changed to Retacrit on 02/14/2018.  - She received Feraheme on 03/22/2017 and 03/29/2017. - I have reviewed her labs.  Percent saturation is low at 18.  Ferritin was 175 in November. - I have recommended 2 weekly infusions of Feraheme to keep the percent saturation close to 30. - She will continue Retacrit injections. -I will reevaluate her in 3 months with repeat blood work.      Orders placed this encounter:  Orders Placed This Encounter  Procedures  . CBC with Differential/Platelet  . Comprehensive metabolic panel  . Ferritin  . Iron and TIBC  . Vitamin B12  . Folate      Derek Jack, MD [place] [phone]

## 2018-07-11 ENCOUNTER — Encounter (HOSPITAL_COMMUNITY): Payer: Self-pay

## 2018-07-11 ENCOUNTER — Inpatient Hospital Stay (HOSPITAL_COMMUNITY): Payer: Medicare HMO

## 2018-07-11 VITALS — BP 139/43 | HR 68 | Temp 97.6°F | Resp 18

## 2018-07-11 DIAGNOSIS — D638 Anemia in other chronic diseases classified elsewhere: Secondary | ICD-10-CM | POA: Diagnosis not present

## 2018-07-11 DIAGNOSIS — E611 Iron deficiency: Secondary | ICD-10-CM | POA: Diagnosis not present

## 2018-07-11 DIAGNOSIS — Z79899 Other long term (current) drug therapy: Secondary | ICD-10-CM | POA: Diagnosis not present

## 2018-07-11 DIAGNOSIS — N184 Chronic kidney disease, stage 4 (severe): Secondary | ICD-10-CM

## 2018-07-11 DIAGNOSIS — D631 Anemia in chronic kidney disease: Secondary | ICD-10-CM

## 2018-07-11 DIAGNOSIS — N183 Chronic kidney disease, stage 3 (moderate): Secondary | ICD-10-CM | POA: Diagnosis not present

## 2018-07-11 MED ORDER — SODIUM CHLORIDE 0.9 % IV SOLN
510.0000 mg | Freq: Once | INTRAVENOUS | Status: AC
  Administered 2018-07-11: 510 mg via INTRAVENOUS
  Filled 2018-07-11: qty 510

## 2018-07-11 MED ORDER — SODIUM CHLORIDE 0.9 % IV SOLN
INTRAVENOUS | Status: DC
  Administered 2018-07-11: 10:00:00 via INTRAVENOUS

## 2018-07-11 NOTE — Progress Notes (Signed)
[name] tolerated Feraheme infusion well without complaints or incident. VSS upon discharge Pt discharged self ambulatory in satisfactory condition accompanied by her husband

## 2018-07-11 NOTE — Patient Instructions (Signed)
[facility] at [facility] Discharge Instructions  Received Feraheme infusion today. Follow-up as scheduled. Call clinic for any questions or concerns   Thank you for choosing [facility] at [facility] to provide your oncology and hematology care.  To afford each patient quality time with our provider, please arrive at least 15 minutes before your scheduled appointment time.   If you have a lab appointment with the Cancer Center please come in thru the  Main Entrance and check in at the main information desk  You need to re-schedule your appointment should you arrive 10 or more minutes late.  We strive to give you quality time with our providers, and arriving late affects you and other patients whose appointments are after yours.  Also, if you no show three or more times for appointments you may be dismissed from the clinic at the providers discretion.     Again, thank you for choosing [facility].  Our hope is that these requests will decrease the amount of time that you wait before being seen by our physicians.       _____________________________________________________________  Should you have questions after your visit to [facility], please contact our office at [phone] between the hours of 8:00 a.m. and 4:30 p.m.  Voicemails left after 4:00 p.m. will not be returned until the following business day.  For prescription refill requests, have your pharmacy contact our office and allow 72 hours.    Cancer Center Support Programs:   > Cancer Support Group  2nd Tuesday of the month 1pm-2pm, Journey Room   

## 2018-07-15 DIAGNOSIS — E039 Hypothyroidism, unspecified: Secondary | ICD-10-CM | POA: Diagnosis not present

## 2018-07-15 DIAGNOSIS — E119 Type 2 diabetes mellitus without complications: Secondary | ICD-10-CM | POA: Diagnosis not present

## 2018-07-15 DIAGNOSIS — I129 Hypertensive chronic kidney disease with stage 1 through stage 4 chronic kidney disease, or unspecified chronic kidney disease: Secondary | ICD-10-CM | POA: Diagnosis not present

## 2018-07-15 DIAGNOSIS — I10 Essential (primary) hypertension: Secondary | ICD-10-CM | POA: Diagnosis not present

## 2018-07-18 ENCOUNTER — Encounter (HOSPITAL_COMMUNITY): Payer: Self-pay

## 2018-07-18 ENCOUNTER — Other Ambulatory Visit: Payer: Self-pay

## 2018-07-18 ENCOUNTER — Other Ambulatory Visit: Payer: Self-pay | Admitting: Cardiology

## 2018-07-18 ENCOUNTER — Inpatient Hospital Stay (HOSPITAL_COMMUNITY): Payer: Medicare HMO

## 2018-07-18 ENCOUNTER — Ambulatory Visit (HOSPITAL_COMMUNITY): Payer: Medicare HMO

## 2018-07-18 VITALS — BP 133/46 | HR 63 | Temp 98.1°F | Resp 18

## 2018-07-18 DIAGNOSIS — Z79899 Other long term (current) drug therapy: Secondary | ICD-10-CM | POA: Diagnosis not present

## 2018-07-18 DIAGNOSIS — E611 Iron deficiency: Secondary | ICD-10-CM | POA: Diagnosis not present

## 2018-07-18 DIAGNOSIS — N183 Chronic kidney disease, stage 3 (moderate): Secondary | ICD-10-CM | POA: Diagnosis not present

## 2018-07-18 DIAGNOSIS — D631 Anemia in chronic kidney disease: Secondary | ICD-10-CM

## 2018-07-18 DIAGNOSIS — N184 Chronic kidney disease, stage 4 (severe): Secondary | ICD-10-CM

## 2018-07-18 DIAGNOSIS — D638 Anemia in other chronic diseases classified elsewhere: Secondary | ICD-10-CM | POA: Diagnosis not present

## 2018-07-18 MED ORDER — SODIUM CHLORIDE 0.9 % IV SOLN
510.0000 mg | Freq: Once | INTRAVENOUS | Status: AC
  Administered 2018-07-18: 510 mg via INTRAVENOUS
  Filled 2018-07-18: qty 17

## 2018-07-18 MED ORDER — SODIUM CHLORIDE 0.9 % IV SOLN
Freq: Once | INTRAVENOUS | Status: AC
  Administered 2018-07-18: 13:00:00 via INTRAVENOUS

## 2018-07-18 NOTE — Progress Notes (Signed)
fereheme given per orders. Patient tolerated it well without problems. Vitals stable and discharged home from clinic ambulatory. Follow up as scheduled.

## 2018-07-18 NOTE — Progress Notes (Signed)
Pt presents today for Feraheme infusion. VSS. Pt has no complaints of any changes since the last visit.

## 2018-07-18 NOTE — Patient Instructions (Signed)
[facility] at [facility]  Discharge Instructions:   _______________________________________________________________  Thank you for choosing [facility] at [facility] to provide your oncology and hematology care.  To afford each patient quality time with our providers, please arrive at least 15 minutes before your scheduled appointment.  You need to re-schedule your appointment if you arrive 10 or more minutes late.  We strive to give you quality time with our providers, and arriving late affects you and other patients whose appointments are after yours.  Also, if you no show three or more times for appointments you may be dismissed from the clinic.  Again, thank you for choosing [facility] at [facility]. Our hope is that these requests will allow you access to exceptional care and in a timely manner. _______________________________________________________________  If you have questions after your visit, please contact our office at [phone] between the hours of 8:30 a.m. and 5:00 p.m. Voicemails left after 4:30 p.m. will not be returned until the following business day. _______________________________________________________________  For prescription refill requests, have your pharmacy contact our office. _______________________________________________________________  Recommendations made by the consultant and any test results will be sent to your referring physician. _______________________________________________________________ 

## 2018-07-21 NOTE — Progress Notes (Deleted)
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 Most recently May 2019 with EGD/enteroscopy and Dieulafoy lesion found as likely culprit s/p clip placement

## 2018-07-22 ENCOUNTER — Encounter: Payer: Self-pay | Admitting: Gastroenterology

## 2018-07-22 ENCOUNTER — Telehealth: Payer: Self-pay | Admitting: Gastroenterology

## 2018-07-22 ENCOUNTER — Ambulatory Visit: Payer: Medicare HMO | Admitting: Gastroenterology

## 2018-07-22 NOTE — Telephone Encounter (Signed)
Patient was a no show and letter sent  °

## 2018-07-28 ENCOUNTER — Telehealth: Payer: Self-pay

## 2018-07-28 ENCOUNTER — Other Ambulatory Visit: Payer: Self-pay

## 2018-07-28 ENCOUNTER — Ambulatory Visit (INDEPENDENT_AMBULATORY_CARE_PROVIDER_SITE_OTHER): Payer: Self-pay | Admitting: Vascular Surgery

## 2018-07-28 ENCOUNTER — Encounter: Payer: Self-pay | Admitting: Vascular Surgery

## 2018-07-28 ENCOUNTER — Other Ambulatory Visit (HOSPITAL_COMMUNITY)
Admission: RE | Admit: 2018-07-28 | Discharge: 2018-07-28 | Disposition: A | Discharge status: Home / Self Care | Payer: Medicare HMO | Source: Ambulatory Visit | Attending: Nephrology | Admitting: Nephrology

## 2018-07-28 VITALS — BP 152/68 | HR 68 | Temp 96.9°F | Resp 18 | Wt 183.4 lb

## 2018-07-28 DIAGNOSIS — E559 Vitamin D deficiency, unspecified: Secondary | ICD-10-CM | POA: Diagnosis not present

## 2018-07-28 DIAGNOSIS — Z79899 Other long term (current) drug therapy: Secondary | ICD-10-CM | POA: Diagnosis not present

## 2018-07-28 DIAGNOSIS — D509 Iron deficiency anemia, unspecified: Secondary | ICD-10-CM | POA: Diagnosis not present

## 2018-07-28 DIAGNOSIS — N184 Chronic kidney disease, stage 4 (severe): Secondary | ICD-10-CM | POA: Insufficient documentation

## 2018-07-28 DIAGNOSIS — R809 Proteinuria, unspecified: Secondary | ICD-10-CM | POA: Diagnosis not present

## 2018-07-28 DIAGNOSIS — I10 Essential (primary) hypertension: Secondary | ICD-10-CM | POA: Insufficient documentation

## 2018-07-28 DIAGNOSIS — N185 Chronic kidney disease, stage 5: Secondary | ICD-10-CM

## 2018-07-28 LAB — RENAL FUNCTION PANEL
Albumin: 3.7 g/dL (ref 3.5–5.0)
Anion gap: 11 (ref 5–15)
BUN: 43 mg/dL — ABNORMAL HIGH (ref 8–23)
CO2: 19 mmol/L — ABNORMAL LOW (ref 22–32)
Calcium: 8.5 mg/dL — ABNORMAL LOW (ref 8.9–10.3)
Chloride: 112 mmol/L — ABNORMAL HIGH (ref 98–111)
Creatinine, Ser: 3.23 mg/dL — ABNORMAL HIGH (ref 0.44–1.00)
GFR calc Af Amer: 15 mL/min — ABNORMAL LOW (ref >60)
GFR calc non Af Amer: 13 mL/min — ABNORMAL LOW (ref >60)
Glucose, Bld: 148 mg/dL — ABNORMAL HIGH (ref 70–99)
Phosphorus: 4.9 mg/dL — ABNORMAL HIGH (ref 2.5–4.6)
Potassium: 4.9 mmol/L (ref 3.5–5.1)
Sodium: 142 mmol/L (ref 135–145)

## 2018-07-28 LAB — FERRITIN: Ferritin: 660 ng/mL — ABNORMAL HIGH (ref 11–307)

## 2018-07-28 LAB — PROTEIN / CREATININE RATIO, URINE
Creatinine, Urine: 62.45 mg/dL
Total Protein, Urine: 319 mg/dL

## 2018-07-28 LAB — IRON AND TIBC
IRON: 47 ug/dL (ref 28–170)
Saturation Ratios: 20 % (ref 10.4–31.8)
TIBC: 230 ug/dL — ABNORMAL LOW (ref 250–450)
UIBC: 183 ug/dL

## 2018-07-28 LAB — HEMOGLOBIN AND HEMATOCRIT, BLOOD
HCT: 35.1 % — ABNORMAL LOW (ref 36.0–46.0)
Hemoglobin: 10.4 g/dL — ABNORMAL LOW (ref 12.0–15.0)

## 2018-07-28 NOTE — Telephone Encounter (Signed)
Spoke to pt to schedule surgery date/time. Pre-op instructions provided: arrive at [facility] at 0530, npo after midnight, high protein snack the night before, have a driver for discharge. Pt verbalized understanding.

## 2018-07-28 NOTE — H&P (View-Only) (Signed)
Patient name: [name] MRN: [id number] DOB: 02/26/1941 Sex: female   [place] office  REASON FOR VISIT: Follow-up right first stage brachiobasilic fistula  HPI: [name] is a 78 y.o. female here today for follow-up.  She underwent first stage brachiobasilic fistula on 4/40/1027.  She has had no steal type symptoms.  She does report itching over her torso and also 10 pound weight gain and swelling in her lower extremities.  She has follow-up with her nephrologist later this month  Current Outpatient Medications  Medication Sig Dispense Refill  . acetaminophen (TYLENOL) 325 MG tablet Take 2 tablets (650 mg total) by mouth every 6 (six) hours as needed for mild pain or headache (or Fever >/= 101). 40 tablet 0  . Alcohol Swabs (B-D SINGLE USE SWABS REGULAR) PADS     . amLODipine (NORVASC) 10 MG tablet Take 1 tablet (10 mg total) by mouth daily. 90 tablet 3  . Calcium-Magnesium-Zinc (CAL-MAG-ZINC PO) Take 1 tablet by mouth daily.    . Cholecalciferol (VITAMIN D-3) 125 MCG (5000 UT) TABS Take 5,000 mg by mouth daily.    Marland Kitchen docusate sodium (COLACE) 100 MG capsule Take 1 capsule (100 mg total) by mouth every 12 (twelve) hours. (Patient taking differently: Take 100 mg by mouth at bedtime. ) 60 capsule 0  . furosemide (LASIX) 20 MG tablet TAKE 1 TABLET BY MOUTH AS NEEDED ONLY  HAS  TO  TAKE  IT  WHEN  SHE  GAINS  2  POUNDS 30 tablet 0  . gabapentin (NEURONTIN) 300 MG capsule Take 2 capsules (600 mg total) by mouth at bedtime. 180 capsule 0  . glucose blood test strip 1 each by Other route 4 (four) times daily. Use as instructed bid. E11.65 400 each 2  . hydrALAZINE (APRESOLINE) 25 MG tablet Take 1 tablet (25 mg total) by mouth 2 (two) times daily. 180 tablet 1  . metoprolol tartrate (LOPRESSOR) 25 MG tablet Take 0.5 tablets (12.5 mg total) by mouth 2 (two) times daily. 30 tablet 0  . Multiple Vitamin (MULTIVITAMIN WITH MINERALS) TABS tablet Take 1  tablet by mouth daily.    . NP THYROID 90 MG tablet Take 90 mg by mouth daily.    . pantoprazole (PROTONIX) 40 MG tablet Take 1 tablet (40 mg total) by mouth 2 (two) times daily. 30 minutes before breakfast (Patient taking differently: Take 40 mg by mouth daily before breakfast. ) 180 tablet 3  . sodium bicarbonate 650 MG tablet Take 650 mg by mouth 2 (two) times daily.    . sodium bicarbonate 650 MG tablet Take by mouth.    Marland Kitchen ULORIC 40 MG tablet Take 40 mg by mouth daily.   3  . UNABLE TO FIND Toujeo     No current facility-administered medications for this visit.    Facility-Administered Medications Ordered in Other Visits  Medication Dose Route Frequency Provider Last Rate Last Dose  . Darbepoetin Alfa (ARANESP) injection 200 mcg  200 mcg Subcutaneous Once Higgs, Vetta, MD         PHYSICAL EXAM: Vitals:   07/28/18 0857  BP: (!) 152/68  Pulse: 68  Resp: 18  Temp: (!) 96.9 F (36.1 C)  TempSrc: Temporal  Weight: 183 lb 6.4 oz (83.2 kg)    GENERAL: The patient is a well-nourished female, in no acute distress. The vital signs are documented above. Well-healed anastomosis at the antecubital space.  Excellent thrill.  I imaged her veins with SonoSite ultrasound and she  has very nice maturation throughout her upper arm.  MEDICAL ISSUES: Stable following first stage brachiobasilic fistula.  Have recommended transposition at her earliest convenience.  She will contact her nephrologist to notify them of her itching and weight gain.  We would be available for placement of catheter at the same setting if indicated.  Will take an additional 4 to 6 weeks for maturation after transposition   Rosetta Posner, MD [facility] Tel [phone] Pager [phone]

## 2018-07-28 NOTE — Progress Notes (Signed)
Patient name: [name] MRN: [id number] DOB: 1940/11/11 Sex: female   [place] office  REASON FOR VISIT: Follow-up right first stage brachiobasilic fistula  HPI: [name] is a 78 y.o. female here today for follow-up.  She underwent first stage brachiobasilic fistula on 5/00/9381.  She has had no steal type symptoms.  She does report itching over her torso and also 10 pound weight gain and swelling in her lower extremities.  She has follow-up with her nephrologist later this month  Current Outpatient Medications  Medication Sig Dispense Refill  . acetaminophen (TYLENOL) 325 MG tablet Take 2 tablets (650 mg total) by mouth every 6 (six) hours as needed for mild pain or headache (or Fever >/= 101). 40 tablet 0  . Alcohol Swabs (B-D SINGLE USE SWABS REGULAR) PADS     . amLODipine (NORVASC) 10 MG tablet Take 1 tablet (10 mg total) by mouth daily. 90 tablet 3  . Calcium-Magnesium-Zinc (CAL-MAG-ZINC PO) Take 1 tablet by mouth daily.    . Cholecalciferol (VITAMIN D-3) 125 MCG (5000 UT) TABS Take 5,000 mg by mouth daily.    Marland Kitchen docusate sodium (COLACE) 100 MG capsule Take 1 capsule (100 mg total) by mouth every 12 (twelve) hours. (Patient taking differently: Take 100 mg by mouth at bedtime. ) 60 capsule 0  . furosemide (LASIX) 20 MG tablet TAKE 1 TABLET BY MOUTH AS NEEDED ONLY  HAS  TO  TAKE  IT  WHEN  SHE  GAINS  2  POUNDS 30 tablet 0  . gabapentin (NEURONTIN) 300 MG capsule Take 2 capsules (600 mg total) by mouth at bedtime. 180 capsule 0  . glucose blood test strip 1 each by Other route 4 (four) times daily. Use as instructed bid. E11.65 400 each 2  . hydrALAZINE (APRESOLINE) 25 MG tablet Take 1 tablet (25 mg total) by mouth 2 (two) times daily. 180 tablet 1  . metoprolol tartrate (LOPRESSOR) 25 MG tablet Take 0.5 tablets (12.5 mg total) by mouth 2 (two) times daily. 30 tablet 0  . Multiple Vitamin (MULTIVITAMIN WITH MINERALS) TABS tablet Take 1  tablet by mouth daily.    . NP THYROID 90 MG tablet Take 90 mg by mouth daily.    . pantoprazole (PROTONIX) 40 MG tablet Take 1 tablet (40 mg total) by mouth 2 (two) times daily. 30 minutes before breakfast (Patient taking differently: Take 40 mg by mouth daily before breakfast. ) 180 tablet 3  . sodium bicarbonate 650 MG tablet Take 650 mg by mouth 2 (two) times daily.    . sodium bicarbonate 650 MG tablet Take by mouth.    Marland Kitchen ULORIC 40 MG tablet Take 40 mg by mouth daily.   3  . UNABLE TO FIND Toujeo     No current facility-administered medications for this visit.    Facility-Administered Medications Ordered in Other Visits  Medication Dose Route Frequency Provider Last Rate Last Dose  . Darbepoetin Alfa (ARANESP) injection 200 mcg  200 mcg Subcutaneous Once Higgs, Vetta, MD         PHYSICAL EXAM: Vitals:   07/28/18 0857  BP: (!) 152/68  Pulse: 68  Resp: 18  Temp: (!) 96.9 F (36.1 C)  TempSrc: Temporal  Weight: 183 lb 6.4 oz (83.2 kg)    GENERAL: The patient is a well-nourished female, in no acute distress. The vital signs are documented above. Well-healed anastomosis at the antecubital space.  Excellent thrill.  I imaged her veins with SonoSite ultrasound and she  has very nice maturation throughout her upper arm.  MEDICAL ISSUES: Stable following first stage brachiobasilic fistula.  Have recommended transposition at her earliest convenience.  She will contact her nephrologist to notify them of her itching and weight gain.  We would be available for placement of catheter at the same setting if indicated.  Will take an additional 4 to 6 weeks for maturation after transposition   Rosetta Posner, MD [facility] Tel [phone] Pager [phone]

## 2018-07-29 DIAGNOSIS — I509 Heart failure, unspecified: Secondary | ICD-10-CM | POA: Diagnosis not present

## 2018-07-29 DIAGNOSIS — E1129 Type 2 diabetes mellitus with other diabetic kidney complication: Secondary | ICD-10-CM | POA: Diagnosis not present

## 2018-07-29 DIAGNOSIS — D638 Anemia in other chronic diseases classified elsewhere: Secondary | ICD-10-CM | POA: Diagnosis not present

## 2018-07-29 DIAGNOSIS — N184 Chronic kidney disease, stage 4 (severe): Secondary | ICD-10-CM | POA: Diagnosis not present

## 2018-07-29 DIAGNOSIS — E8889 Other specified metabolic disorders: Secondary | ICD-10-CM | POA: Diagnosis not present

## 2018-07-29 DIAGNOSIS — I10 Essential (primary) hypertension: Secondary | ICD-10-CM | POA: Diagnosis not present

## 2018-07-29 LAB — VITAMIN D 25 HYDROXY (VIT D DEFICIENCY, FRACTURES): Vit D, 25-Hydroxy: 20.5 ng/mL — ABNORMAL LOW (ref 30.0–100.0)

## 2018-07-29 LAB — PARATHYROID HORMONE, INTACT (NO CA): PTH: 79 pg/mL — ABNORMAL HIGH (ref 15–65)

## 2018-07-31 ENCOUNTER — Telehealth: Payer: Self-pay | Admitting: *Deleted

## 2018-07-31 NOTE — Telephone Encounter (Signed)
Phone call to patient and notified of time change for 08/07/2018 surgery. To arrive at admitting department at 8:00 am. All other pre-op instructions unchanged. Verbalized understanding.

## 2018-08-05 ENCOUNTER — Telehealth: Payer: Self-pay | Admitting: *Deleted

## 2018-08-05 ENCOUNTER — Encounter (HOSPITAL_COMMUNITY): Payer: Self-pay

## 2018-08-05 ENCOUNTER — Inpatient Hospital Stay (HOSPITAL_COMMUNITY): Payer: Medicare HMO

## 2018-08-05 ENCOUNTER — Other Ambulatory Visit: Payer: Self-pay

## 2018-08-05 ENCOUNTER — Inpatient Hospital Stay (HOSPITAL_COMMUNITY): Payer: Medicare HMO | Attending: Hematology

## 2018-08-05 VITALS — BP 163/51 | HR 71 | Temp 97.9°F | Resp 18

## 2018-08-05 DIAGNOSIS — D638 Anemia in other chronic diseases classified elsewhere: Secondary | ICD-10-CM | POA: Diagnosis not present

## 2018-08-05 DIAGNOSIS — D631 Anemia in chronic kidney disease: Secondary | ICD-10-CM

## 2018-08-05 DIAGNOSIS — N183 Chronic kidney disease, stage 3 (moderate): Secondary | ICD-10-CM | POA: Diagnosis not present

## 2018-08-05 DIAGNOSIS — N184 Chronic kidney disease, stage 4 (severe): Secondary | ICD-10-CM

## 2018-08-05 DIAGNOSIS — E611 Iron deficiency: Secondary | ICD-10-CM | POA: Diagnosis not present

## 2018-08-05 DIAGNOSIS — Z79899 Other long term (current) drug therapy: Secondary | ICD-10-CM | POA: Insufficient documentation

## 2018-08-05 LAB — CBC
HCT: 34.4 % — ABNORMAL LOW (ref 36.0–46.0)
Hemoglobin: 10.6 g/dL — ABNORMAL LOW (ref 12.0–15.0)
MCH: 29.4 pg (ref 26.0–34.0)
MCHC: 30.8 g/dL (ref 30.0–36.0)
MCV: 95.3 fL (ref 80.0–100.0)
Platelets: 262 10*3/uL (ref 150–400)
RBC: 3.61 MIL/uL — ABNORMAL LOW (ref 3.87–5.11)
RDW: 15.1 % (ref 11.5–15.5)
WBC: 7.4 10*3/uL (ref 4.0–10.5)
nRBC: 0 % (ref 0.0–0.2)

## 2018-08-05 MED ORDER — EPOETIN ALFA-EPBX 40000 UNIT/ML IJ SOLN
40000.0000 [IU] | Freq: Once | INTRAMUSCULAR | Status: AC
  Administered 2018-08-05: 40000 [IU] via SUBCUTANEOUS
  Filled 2018-08-05: qty 1

## 2018-08-05 MED ORDER — FENTANYL CITRATE (PF) 100 MCG/2ML IJ SOLN: 25.0000 ug | INTRAMUSCULAR | Status: DC | PRN

## 2018-08-05 NOTE — Progress Notes (Signed)
[name] tolerated Retacrit injection well without complaints or incident. Hgb 10.6 today. VSS Pt discharged self ambulatory in satisfactory condition accompanied by family member

## 2018-08-05 NOTE — Patient Instructions (Signed)
[place] at [facility] Discharge Instructions  Received Retacrit injection today. Follow-up as scheduled. Call clinic for any questions or concerns   Thank you for choosing [place] at [facility] to provide your oncology and hematology care.  To afford each patient quality time with our provider, please arrive at least 15 minutes before your scheduled appointment time.   If you have a lab appointment with the [facility] please come in thru the  Main Entrance and check in at the main information desk  You need to re-schedule your appointment should you arrive 10 or more minutes late.  We strive to give you quality time with our providers, and arriving late affects you and other patients whose appointments are after yours.  Also, if you no show three or more times for appointments you may be dismissed from the clinic at the providers discretion.     Again, thank you for choosing [facility].  Our hope is that these requests will decrease the amount of time that you wait before being seen by our physicians.       _____________________________________________________________  Should you have questions after your visit to [facility], please contact our office at [phone] between the hours of 8:00 a.m. and 4:30 p.m.  Voicemails left after 4:00 p.m. will not be returned until the following business day.  For prescription refill requests, have your pharmacy contact our office and allow 72 hours.    Cancer Center Support Programs:   > Cancer Support Group  2nd Tuesday of the month 1pm-2pm, Journey Room

## 2018-08-05 NOTE — Telephone Encounter (Signed)
Message left on voice mail of both phone numbers given for patient. Notified of time change for surgery on 08/07/2018 with Dr. Donnetta Hutching. Instructed to be at [facility] admitting department at 5:30 am. All other instructions unchanged.

## 2018-08-06 ENCOUNTER — Other Ambulatory Visit: Payer: Self-pay

## 2018-08-06 ENCOUNTER — Encounter (HOSPITAL_COMMUNITY): Payer: Self-pay | Admitting: *Deleted

## 2018-08-06 NOTE — Progress Notes (Signed)
Pt denies SOB and chest pain. Pt under the care of Dr. Harl Bowie, Cardiology. Pt denies having a stress test and cardiac cath. Pt denies having an A1c within the last 2 months. Pt made aware to stop taking vitamins, fish oil and herbal medications. Do not take any NSAIDs ie: Ibuprofen, Advil, Naproxen (Aleve), Motrin, BC and Goody Powder. Pt made aware to take 2 units of Toujeo insulin tonight. Pt made aware to check BG every 2 hours prior to arrival to hospital on DOS. Pt made aware to treat a BG < 70 with 4 glucose tabs or glucose gel or 4 ounces of apple or cranberry juice, wait 15 minutes after intervention to recheck BG, if BG remains < 70, call Short Stay unit to speak with a nurse. Pt verbalized understanding of all pre-op instructions.

## 2018-08-06 NOTE — Anesthesia Preprocedure Evaluation (Addendum)
Anesthesia Evaluation  Patient identified by MRN, date of birth, ID band Patient awake    Reviewed: Allergy & Precautions, H&P , NPO status , Patient's Chart, lab work & pertinent test results, reviewed documented beta blocker date and time   Airway Mallampati: II  TM Distance: >3 FB Neck ROM: Full    Dental no notable dental hx. (+) Teeth Intact, Dental Advisory Given   Pulmonary neg pulmonary ROS,    Pulmonary exam normal breath sounds clear to auscultation       Cardiovascular Exercise Tolerance: Good hypertension, Pt. on medications and Pt. on home beta blockers + Past MI and +CHF   Rhythm:Regular Rate:Normal     Neuro/Psych  Headaches, negative psych ROS   GI/Hepatic Neg liver ROS, GERD  Medicated and Controlled,  Endo/Other  diabetes, Insulin DependentHypothyroidism   Renal/GU ESRFRenal disease  negative genitourinary   Musculoskeletal  (+) Arthritis ,   Abdominal   Peds  Hematology  (+) Blood dyscrasia, anemia ,   Anesthesia Other Findings   Reproductive/Obstetrics negative OB ROS                            Anesthesia Physical Anesthesia Plan  ASA: III  Anesthesia Plan: MAC   Post-op Pain Management:    Induction: Intravenous  PONV Risk Score and Plan: 3 and Ondansetron, Propofol infusion and Treatment may vary due to age or medical condition  Airway Management Planned: Simple Face Mask  Additional Equipment:   Intra-op Plan:   Post-operative Plan:   Informed Consent: I have reviewed the patients History and Physical, chart, labs and discussed the procedure including the risks, benefits and alternatives for the proposed anesthesia with the patient or authorized representative who has indicated his/her understanding and acceptance.     Dental advisory given  Plan Discussed with: CRNA  Anesthesia Plan Comments:         Anesthesia Quick Evaluation

## 2018-08-07 ENCOUNTER — Telehealth: Payer: Self-pay | Admitting: Vascular Surgery

## 2018-08-07 ENCOUNTER — Ambulatory Visit (HOSPITAL_COMMUNITY): Payer: Medicare HMO | Admitting: Certified Registered Nurse Anesthetist

## 2018-08-07 ENCOUNTER — Other Ambulatory Visit: Payer: Self-pay

## 2018-08-07 ENCOUNTER — Ambulatory Visit (HOSPITAL_COMMUNITY)
Admission: RE | Admit: 2018-08-07 | Discharge: 2018-08-07 | Disposition: A | Discharge status: Home / Self Care | Payer: Medicare HMO | Attending: Vascular Surgery | Admitting: Vascular Surgery

## 2018-08-07 ENCOUNTER — Encounter (HOSPITAL_COMMUNITY)
Admission: RE | Disposition: A | Discharge status: Home / Self Care | Payer: Self-pay | Source: Home / Self Care | Attending: Vascular Surgery

## 2018-08-07 ENCOUNTER — Encounter (HOSPITAL_COMMUNITY): Payer: Self-pay

## 2018-08-07 DIAGNOSIS — N189 Chronic kidney disease, unspecified: Secondary | ICD-10-CM | POA: Diagnosis present

## 2018-08-07 DIAGNOSIS — E1122 Type 2 diabetes mellitus with diabetic chronic kidney disease: Secondary | ICD-10-CM | POA: Insufficient documentation

## 2018-08-07 DIAGNOSIS — K219 Gastro-esophageal reflux disease without esophagitis: Secondary | ICD-10-CM | POA: Diagnosis not present

## 2018-08-07 DIAGNOSIS — I132 Hypertensive heart and chronic kidney disease with heart failure and with stage 5 chronic kidney disease, or end stage renal disease: Secondary | ICD-10-CM | POA: Diagnosis not present

## 2018-08-07 DIAGNOSIS — D631 Anemia in chronic kidney disease: Secondary | ICD-10-CM | POA: Insufficient documentation

## 2018-08-07 DIAGNOSIS — I509 Heart failure, unspecified: Secondary | ICD-10-CM | POA: Diagnosis not present

## 2018-08-07 DIAGNOSIS — N186 End stage renal disease: Secondary | ICD-10-CM | POA: Diagnosis not present

## 2018-08-07 DIAGNOSIS — M1990 Unspecified osteoarthritis, unspecified site: Secondary | ICD-10-CM | POA: Diagnosis not present

## 2018-08-07 DIAGNOSIS — R51 Headache: Secondary | ICD-10-CM | POA: Diagnosis not present

## 2018-08-07 DIAGNOSIS — Z79899 Other long term (current) drug therapy: Secondary | ICD-10-CM | POA: Diagnosis not present

## 2018-08-07 DIAGNOSIS — Z794 Long term (current) use of insulin: Secondary | ICD-10-CM | POA: Diagnosis not present

## 2018-08-07 DIAGNOSIS — I129 Hypertensive chronic kidney disease with stage 1 through stage 4 chronic kidney disease, or unspecified chronic kidney disease: Secondary | ICD-10-CM | POA: Diagnosis not present

## 2018-08-07 DIAGNOSIS — N184 Chronic kidney disease, stage 4 (severe): Secondary | ICD-10-CM | POA: Diagnosis not present

## 2018-08-07 DIAGNOSIS — I252 Old myocardial infarction: Secondary | ICD-10-CM | POA: Insufficient documentation

## 2018-08-07 HISTORY — PX: BASCILIC VEIN TRANSPOSITION: SHX5742

## 2018-08-07 LAB — GLUCOSE, CAPILLARY
Glucose-Capillary: 149 mg/dL — ABNORMAL HIGH (ref 70–99)
Glucose-Capillary: 151 mg/dL — ABNORMAL HIGH (ref 70–99)

## 2018-08-07 LAB — POCT I-STAT 4, (NA,K, GLUC, HGB,HCT)
Glucose, Bld: 158 mg/dL — ABNORMAL HIGH (ref 70–99)
HCT: 29 % — ABNORMAL LOW (ref 36.0–46.0)
Hemoglobin: 9.9 g/dL — ABNORMAL LOW (ref 12.0–15.0)
POTASSIUM: 4.9 mmol/L (ref 3.5–5.1)
Sodium: 140 mmol/L (ref 135–145)

## 2018-08-07 SURGERY — TRANSPOSITION, VEIN, BASILIC
Anesthesia: Monitor Anesthesia Care | Site: Arm Upper | Laterality: Right

## 2018-08-07 MED ORDER — LIDOCAINE-EPINEPHRINE 0.5 %-1:200000 IJ SOLN
INTRAMUSCULAR | Status: DC | PRN
  Administered 2018-08-07: 50 mL

## 2018-08-07 MED ORDER — ONDANSETRON HCL 4 MG/2ML IJ SOLN
INTRAMUSCULAR | Status: AC
  Filled 2018-08-07: qty 2

## 2018-08-07 MED ORDER — ACETAMINOPHEN 500 MG PO TABS
1000.0000 mg | ORAL_TABLET | Freq: Once | ORAL | Status: AC
  Administered 2018-08-07: 1000 mg via ORAL
  Filled 2018-08-07: qty 2

## 2018-08-07 MED ORDER — MIDAZOLAM HCL 2 MG/2ML IJ SOLN
INTRAMUSCULAR | Status: AC
  Filled 2018-08-07: qty 2

## 2018-08-07 MED ORDER — FENTANYL CITRATE (PF) 250 MCG/5ML IJ SOLN
INTRAMUSCULAR | Status: DC | PRN
  Administered 2018-08-07 (×3): 25 ug via INTRAVENOUS

## 2018-08-07 MED ORDER — CEFAZOLIN SODIUM-DEXTROSE 2-4 GM/100ML-% IV SOLN
2.0000 g | INTRAVENOUS | Status: AC
  Administered 2018-08-07: 2 g via INTRAVENOUS
  Filled 2018-08-07: qty 100

## 2018-08-07 MED ORDER — MIDAZOLAM HCL 2 MG/2ML IJ SOLN
INTRAMUSCULAR | Status: DC | PRN
  Administered 2018-08-07: 1 mg via INTRAVENOUS

## 2018-08-07 MED ORDER — CHLORHEXIDINE GLUCONATE CLOTH 2 % EX PADS: 6.0000 | MEDICATED_PAD | Freq: Once | CUTANEOUS | Status: DC

## 2018-08-07 MED ORDER — LIDOCAINE-EPINEPHRINE (PF) 1 %-1:200000 IJ SOLN
INTRAMUSCULAR | Status: AC
  Filled 2018-08-07: qty 30

## 2018-08-07 MED ORDER — 0.9 % SODIUM CHLORIDE (POUR BTL) OPTIME
TOPICAL | Status: DC | PRN
  Administered 2018-08-07: 1000 mL

## 2018-08-07 MED ORDER — SODIUM CHLORIDE 0.9 % IV SOLN
INTRAVENOUS | Status: DC | PRN
  Administered 2018-08-07: 09:00:00

## 2018-08-07 MED ORDER — FENTANYL CITRATE (PF) 250 MCG/5ML IJ SOLN
INTRAMUSCULAR | Status: AC
  Filled 2018-08-07: qty 5

## 2018-08-07 MED ORDER — PHENYLEPHRINE 40 MCG/ML (10ML) SYRINGE FOR IV PUSH (FOR BLOOD PRESSURE SUPPORT)
PREFILLED_SYRINGE | INTRAVENOUS | Status: AC
  Filled 2018-08-07: qty 10

## 2018-08-07 MED ORDER — SODIUM CHLORIDE 0.9 % IV SOLN
INTRAVENOUS | Status: DC | PRN
  Administered 2018-08-07: 25 ug/min via INTRAVENOUS

## 2018-08-07 MED ORDER — ONDANSETRON HCL 4 MG/2ML IJ SOLN
INTRAMUSCULAR | Status: DC | PRN
  Administered 2018-08-07: 4 mg via INTRAVENOUS

## 2018-08-07 MED ORDER — SODIUM CHLORIDE 0.9 % IV SOLN
INTRAVENOUS | Status: DC
  Administered 2018-08-07: 07:00:00 via INTRAVENOUS

## 2018-08-07 MED ORDER — PROPOFOL 10 MG/ML IV BOLUS
INTRAVENOUS | Status: AC
  Filled 2018-08-07: qty 20

## 2018-08-07 MED ORDER — FENTANYL CITRATE (PF) 100 MCG/2ML IJ SOLN: 25.0000 ug | INTRAMUSCULAR | Status: DC | PRN

## 2018-08-07 MED ORDER — PROPOFOL 500 MG/50ML IV EMUL
INTRAVENOUS | Status: DC | PRN
  Administered 2018-08-07: 25 ug/kg/min via INTRAVENOUS

## 2018-08-07 MED ORDER — LIDOCAINE HCL (PF) 1 % IJ SOLN
INTRAMUSCULAR | Status: AC
  Filled 2018-08-07: qty 30

## 2018-08-07 MED ORDER — SODIUM CHLORIDE 0.9 % IV SOLN
INTRAVENOUS | Status: AC
  Filled 2018-08-07: qty 1.2

## 2018-08-07 MED ORDER — LIDOCAINE-EPINEPHRINE 0.5 %-1:200000 IJ SOLN
INTRAMUSCULAR | Status: AC
  Filled 2018-08-07: qty 1

## 2018-08-07 SURGICAL SUPPLY — 39 items
ARMBAND PINK RESTRICT EXTREMIT (MISCELLANEOUS) ×3 IMPLANT
CANISTER SUCT 3000ML PPV (MISCELLANEOUS) ×3 IMPLANT
CANNULA VESSEL 3MM 2 BLNT TIP (CANNULA) ×3 IMPLANT
CLIP LIGATING EXTRA MED SLVR (CLIP) ×3 IMPLANT
CLIP LIGATING EXTRA SM BLUE (MISCELLANEOUS) ×3 IMPLANT
COVER PROBE W GEL 5X96 (DRAPES) ×3 IMPLANT
COVER WAND RF STERILE (DRAPES) ×3 IMPLANT
DECANTER SPIKE VIAL GLASS SM (MISCELLANEOUS) ×3 IMPLANT
DERMABOND ADHESIVE PROPEN (GAUZE/BANDAGES/DRESSINGS) ×2
DERMABOND ADVANCED (GAUZE/BANDAGES/DRESSINGS) ×2
DERMABOND ADVANCED .7 DNX12 (GAUZE/BANDAGES/DRESSINGS) ×1 IMPLANT
DERMABOND ADVANCED .7 DNX6 (GAUZE/BANDAGES/DRESSINGS) ×1 IMPLANT
ELECT REM PT RETURN 9FT ADLT (ELECTROSURGICAL) ×3
ELECTRODE REM PT RTRN 9FT ADLT (ELECTROSURGICAL) ×1 IMPLANT
GLOVE BIO SURGEON STRL SZ8 (GLOVE) ×3 IMPLANT
GLOVE BIOGEL PI IND STRL 7.5 (GLOVE) ×1 IMPLANT
GLOVE BIOGEL PI INDICATOR 7.5 (GLOVE) ×2
GLOVE SS BIOGEL STRL SZ 7.5 (GLOVE) ×1 IMPLANT
GLOVE SUPERSENSE BIOGEL SZ 7.5 (GLOVE) ×2
GOWN STRL REUS W/ TWL LRG LVL3 (GOWN DISPOSABLE) ×3 IMPLANT
GOWN STRL REUS W/ TWL XL LVL3 (GOWN DISPOSABLE) ×1 IMPLANT
GOWN STRL REUS W/TWL LRG LVL3 (GOWN DISPOSABLE) ×6
GOWN STRL REUS W/TWL XL LVL3 (GOWN DISPOSABLE) ×2
KIT BASIN OR (CUSTOM PROCEDURE TRAY) ×3 IMPLANT
KIT TURNOVER KIT B (KITS) ×3 IMPLANT
NS IRRIG 1000ML POUR BTL (IV SOLUTION) ×3 IMPLANT
PACK CV ACCESS (CUSTOM PROCEDURE TRAY) ×3 IMPLANT
PAD ARMBOARD 7.5X6 YLW CONV (MISCELLANEOUS) ×6 IMPLANT
SUT PROLENE 6 0 CC (SUTURE) ×6 IMPLANT
SUT SILK 2 0 SH (SUTURE) IMPLANT
SUT SILK 3 0 (SUTURE) ×2
SUT SILK 3-0 18XBRD TIE 12 (SUTURE) ×1 IMPLANT
SUT SILK 4 0 (SUTURE) ×2
SUT SILK 4-0 18XBRD TIE 12 (SUTURE) ×1 IMPLANT
SUT VIC AB 3-0 SH 27 (SUTURE) ×2
SUT VIC AB 3-0 SH 27X BRD (SUTURE) ×1 IMPLANT
TOWEL GREEN STERILE (TOWEL DISPOSABLE) ×3 IMPLANT
UNDERPAD 30X30 (UNDERPADS AND DIAPERS) ×3 IMPLANT
WATER STERILE IRR 1000ML POUR (IV SOLUTION) ×3 IMPLANT

## 2018-08-07 NOTE — Transfer of Care (Signed)
Immediate Anesthesia Transfer of Care Note  Patient: [name]  Procedure(s) Performed: [place] STAGE RIGHT ARM (Right Arm Upper)  Patient Location: PACU  Anesthesia Type:MAC  Level of Consciousness: drowsy and patient cooperative  Airway & Oxygen Therapy: Patient Spontanous Breathing and Patient connected to face mask oxygen  Post-op Assessment: Report given to RN and Post -op Vital signs reviewed and stable  Post vital signs: Reviewed and stable  Last Vitals:  Vitals Value Taken Time  BP 116/49 08/07/2018 10:40 AM  Temp    Pulse 66 08/07/2018 10:49 AM  Resp 26 08/07/2018 10:49 AM  SpO2 93 % 08/07/2018 10:49 AM  Vitals shown include unvalidated device data.  Last Pain:  Vitals:   08/07/18 1023  TempSrc:   PainSc: 0-No pain         Complications: No apparent anesthesia complications

## 2018-08-07 NOTE — Op Note (Signed)
    OPERATIVE REPORT  DATE OF SURGERY: 08/07/2018  PATIENT: [name], 78 y.o. female MRN: [id number]  DOB: 1941/01/04  PRE-OPERATIVE DIAGNOSIS: Chronic renal insufficiency  POST-OPERATIVE DIAGNOSIS:  Same  PROCEDURE: Second stage basilic vein fistula transposition right arm  SURGEON:  Curt Jews, M.D.  PHYSICIAN ASSISTANT: Matt Eveland, PA-C  ANESTHESIA: Local with sedation  EBL: per anesthesia record  Total I/O In: -  Out: 10 [Blood:10]  BLOOD ADMINISTERED: none  DRAINS: none  SPECIMEN: none  COUNTS CORRECT:  YES  PATIENT DISPOSITION:  PACU - hemodynamically stable  PROCEDURE DETAILS: Patient was taken to the operating placed supine position where the area of the right arm was prepped and draped in usual sterile fashion.  SonoSite ultrasound was used to visualize and mark the location of the basilic vein which was of large caliber.  Using local anesthesia, incision was made over the antecubital space and carried down to isolate the prior brachial artery to basilic vein anastomosis.  2 separate incisions were made in the mid upper arm and near the axilla for exposure of the vein.  Tributary branches were ligated with 3-0 silk ties and divided.  The vein was mobilized circumferentially from the antecubital space to the axilla was a very good caliber.  The vein was occluded near the arterial anastomosis and was transected.  The vein was brought out through the tunnel and was marked to reduce risk of twisting.  A new tunnel was created from the antecubital space to the axilla and the subcutaneous layer.  The vein was brought back through the tunnel.  The vein was dilated again with saline and was placed on with appropriate tension in the tunnel.  The vein was spatulated and sewn into into itself with a running 6-0 Prolene suture.  Clamps removed and excellent thrill was noted.  The wounds were irrigated with saline.  Hemostasis electrocautery.  Wounds were closed with 3-0  Vicryl in the subcutaneous and subcuticular tissue.  Sterile dressing was applied and the patient was transferred to the recovery room in stable condition with a palpable radial pulse   [name], M.D., [facility] 08/07/2018 10:50 AM

## 2018-08-07 NOTE — Anesthesia Procedure Notes (Signed)
Procedure Name: MAC Date/Time: 08/07/2018 7:44 AM Performed by: Kathryne Hitch, CRNA Pre-anesthesia Checklist: Patient identified, Emergency Drugs available, Suction available, Patient being monitored and Timeout performed Patient Re-evaluated:Patient Re-evaluated prior to induction Oxygen Delivery Method: Simple face mask Induction Type: IV induction Dental Injury: Teeth and Oropharynx as per pre-operative assessment

## 2018-08-07 NOTE — Interval H&P Note (Signed)
History and Physical Interval Note:  08/07/2018 7:19 AM  [name]  has presented today for surgery, with the diagnosis of CHRONIC KIDNEY DISEASE.  The various methods of treatment have been discussed with the patient and family. After consideration of risks, benefits and other options for treatment, the patient has consented to  Procedure(s): Branch (Right) as a surgical intervention.  The patient's history has been reviewed, patient examined, no change in status, stable for surgery.  I have reviewed the patient's chart and labs.  Questions were answered to the patient's satisfaction.     Curt Jews

## 2018-08-07 NOTE — Telephone Encounter (Signed)
Called and left voice mail about appt on 09/15/18 @ 9:00 am will mail reminder letter and follow up paper work.

## 2018-08-07 NOTE — Anesthesia Postprocedure Evaluation (Signed)
Anesthesia Post Note  Patient: [name]  Procedure(s) Performed: [place] STAGE RIGHT ARM (Right Arm Upper)     Patient location during evaluation: PACU Anesthesia Type: MAC Level of consciousness: awake and alert Pain management: pain level controlled Vital Signs Assessment: post-procedure vital signs reviewed and stable Respiratory status: spontaneous breathing, nonlabored ventilation and respiratory function stable Cardiovascular status: stable and blood pressure returned to baseline Postop Assessment: no apparent nausea or vomiting Anesthetic complications: no    Last Vitals:  Vitals:   08/07/18 1053 08/07/18 1104  BP: (!) 124/48 (!) 122/45  Pulse: 62 63  Resp: (!) 22 19  Temp: (!) 36.2 C   SpO2: 95% 96%    Last Pain:  Vitals:   08/07/18 1104  TempSrc:   PainSc: 0-No pain                 Tonji Elliff,W. EDMOND

## 2018-08-08 ENCOUNTER — Encounter (HOSPITAL_COMMUNITY): Payer: Self-pay | Admitting: Vascular Surgery

## 2018-08-15 ENCOUNTER — Encounter (INDEPENDENT_AMBULATORY_CARE_PROVIDER_SITE_OTHER): Payer: Self-pay | Admitting: Nurse Practitioner

## 2018-08-20 DIAGNOSIS — Z139 Encounter for screening, unspecified: Secondary | ICD-10-CM | POA: Diagnosis not present

## 2018-08-20 DIAGNOSIS — Z7189 Other specified counseling: Secondary | ICD-10-CM | POA: Diagnosis not present

## 2018-08-20 DIAGNOSIS — E039 Hypothyroidism, unspecified: Secondary | ICD-10-CM | POA: Diagnosis not present

## 2018-08-20 DIAGNOSIS — E119 Type 2 diabetes mellitus without complications: Secondary | ICD-10-CM | POA: Diagnosis not present

## 2018-08-26 ENCOUNTER — Telehealth: Payer: Self-pay | Admitting: *Deleted

## 2018-08-26 NOTE — Telephone Encounter (Signed)
Patient returned call to [place].  °

## 2018-08-26 NOTE — Telephone Encounter (Signed)
Pt contacted and requested virtual appt with Dr Harl Bowie via [place] [place] - reviewed pt medications/allergies/pharmacy - pt will have vitals and weight available - pt sent MyChart message with instructions and consent

## 2018-08-26 NOTE — Telephone Encounter (Signed)
Pt contacted - requesting a virtual appt but would like her partner to speak with [place] before confirming. Pt will have him contact us when he returns home

## 2018-08-29 ENCOUNTER — Encounter: Payer: Self-pay | Admitting: *Deleted

## 2018-08-29 ENCOUNTER — Encounter: Payer: Self-pay | Admitting: Cardiology

## 2018-08-29 ENCOUNTER — Telehealth (INDEPENDENT_AMBULATORY_CARE_PROVIDER_SITE_OTHER): Payer: Medicare HMO | Admitting: Cardiology

## 2018-08-29 VITALS — BP 140/55 | Ht 64.0 in | Wt 177.0 lb

## 2018-08-29 DIAGNOSIS — I2510 Atherosclerotic heart disease of native coronary artery without angina pectoris: Secondary | ICD-10-CM

## 2018-08-29 DIAGNOSIS — I10 Essential (primary) hypertension: Secondary | ICD-10-CM

## 2018-08-29 DIAGNOSIS — E782 Mixed hyperlipidemia: Secondary | ICD-10-CM

## 2018-08-29 MED ORDER — HYDRALAZINE HCL 25 MG PO TABS: 37.5000 mg | ORAL_TABLET | Freq: Two times a day (BID) | ORAL | 1 refills | Status: DC

## 2018-08-29 NOTE — Patient Instructions (Addendum)
Your physician recommends that you schedule a follow-up appointment in: [place] has recommended you make the following change in your medication:  INCREASE HYDRALAZINE 37.5 MG (1 AND 1/2 TABLETS) TWICE DAILY   Thank you for choosing [facility]!!

## 2018-08-29 NOTE — Progress Notes (Signed)
Virtual Visit via Telephone Note    Evaluation Performed:  Follow-up visit  This visit type was conducted due to national recommendations for restrictions regarding the COVID-19 Pandemic (e.g. social distancing).  This format is felt to be most appropriate for this patient at this time.  All issues noted in this document were discussed and addressed.  No physical exam was performed (except for noted visual exam findings with Video Visits).  Please refer to the patient's chart (MyChart message for video visits and phone note for telephone visits) for the patient's consent to telehealth for [facility].  Date:  08/29/2018   ID:  [name], DOB 1940-09-03, MRN [id number]  Patient Location:  [place], [place]  Provider location:   [place], [place]  PCP:  Doree Albee, MD  Cardiologist:  Dr Carlyle Dolly MD Electrophysiologist:  None   Chief Complaint:  6 months follow up  History of Present Illness:    [name] is a 78 y.o. female who presents via audio/video conferencing for a telehealth visit today.  Seen today for follow up of the following medical problems.   1. NSTEMI - 2 admissions over the last few months with elevated troponins in setting of severe anemia - her initial NSTEMIadmission was at [facility] treated medically including with DAPT, primarily due to advanced renal dysfunction and anemia. Peak troponin 8.5. - readmitted 10/2016 with Hgb of 4.4 , elevated troponin again. DAPT stopped. Peak trop 1.8. 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 Her renal function and recurrent anemia has prohibited ischemic testing. She has statin allergy, no ACEI/ARB due to poor renal function  - 08/2016 echo [facility]: EF 50-55% with inferior wall HK, impaired LV filling, fair study.  07/2017 admit with dark stools and anemia, off DAPT  - no recent chest pain. No recent  SOb/DOE  2. 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 Has not been on ASA.   3.HTN bp 140/55 today, typically SBP in 150s.    4. 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    Prior CV studies:   The following studies were reviewed today:  n/a  Past Medical History:  Diagnosis Date  . Anemia   . Arthritis   . Blood transfusion without reported diagnosis   . Cataract   . Chronic anemia   . Chronic combined systolic and diastolic CHF (congestive heart failure) ([place])    a. 2D echo 08/2016 at [facility]: EF 50-55% with inferior wall HK, impaired LV filling, fair study.  . CKD (chronic kidney disease), stage III ([place])   . Diabetes ([place])   . Gastritis   . GERD (gastroesophageal reflux disease)   . Gout   . Headache   . HTN (hypertension)   . Hyperlipidemia   . Hypothyroidism   . Iron deficiency anemia 11/08/2016  . Normocytic anemia 10/26/2016  . NSTEMI (non-ST elevated myocardial infarction) ([place])    a. Complex admission 08/2016 - with severe hyperglycemia, AKI on CKD, severe anemia down to Hgb 6.8, acute combined CHF, troponin of 8.5, cath deferred due to renal dysfunction.  . Thyroid disease   . Wears dentures    Past Surgical History:  Procedure Laterality Date  . BASCILIC VEIN TRANSPOSITION Right  06/12/2018   Procedure: FIRST STAGE BASCILIC VEIN TRANSPOSITION RIGHT ARM;  Surgeon: Rosetta Posner, MD;  Location: [facility];  Service: Vascular;  Laterality: Right;  . BASCILIC VEIN TRANSPOSITION Right 08/07/2018   Procedure: BASCILIC VEIN TRANSPOSITION SECOND STAGE RIGHT ARM;  Surgeon: Rosetta Posner, MD;  Location: [facility];  Service: Vascular;  Laterality: Right;  . CATARACT EXTRACTION     left  . COLONOSCOPY WITH PROPOFOL N/A 10/08/2016   5 mm transverse colon polyp note resected due to plavix. hemorrhoids   . ENTEROSCOPY N/A 08/08/2017   Procedure: ENTEROSCOPY;  Surgeon: Daneil Dolin, MD;  [name];  Service: Endoscopy;  Laterality: N/A;  . ESOPHAGOGASTRODUODENOSCOPY N/A 10/06/2016   mild chroni gastritis, negative H.pylori  . ESOPHAGOGASTRODUODENOSCOPY (EGD) WITH PROPOFOL N/A 03/12/2017   mild edema/erythema of stomach, small bowel biopsy with focal villous tip lymphocytosis, ?partially developed celiac  . 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 GIVENS CAPSULE STUDY  10/08/2016   normal  . GIVENS CAPSULE STUDY N/A 10/29/2016   occasional gastric erosion, unremarkable small bowel  . ORIF HUMERUS FRACTURE Left 02/07/2015   Procedure: OPEN REDUCTION INTERNAL FIXATION (ORIF) PROXIMAL HUMERUS FRACTURE;  Surgeon: Marybelle Killings, MD;  Location: [facility];  Service: Orthopedics;  Laterality: Left;  . teeth extractions    . THYROID SURGERY    . TOTAL HIP ARTHROPLASTY Left 02/07/2015   Procedure: TOTAL HIP ARTHROPLASTY ANTERIOR APPROACH ;  Surgeon: Marybelle Killings, MD;  Location: [facility];  Service: Orthopedics;  Laterality: Left;  . WRIST SURGERY Left      No outpatient medications have been marked as taking for the 08/29/18 encounter (Appointment) with Arnoldo Lenis, MD.     Allergies:   Insulin glargine and Statins   Social History   Tobacco Use  . Smoking status: Never Smoker  . Smokeless tobacco: Never Used  Substance Use Topics  . Alcohol use: No    Alcohol/week: 0.0 standard drinks  . Drug use: No     Family Hx: The patient's family history includes Asthma in her mother; Diabetes in her mother; Early death in her father; Early death (age of onset: 56) in her mother. There is no history of CAD or GI Bleed.  ROS:   Please see the history of present  illness.    All other systems reviewed and are negative.   Labs/Other Tests and Data Reviewed:    Recent Labs: 10/17/2017: B Natriuretic Peptide 682.0 07/08/2018: ALT 11 07/28/2018: BUN 43; Creatinine, Ser 3.23 08/05/2018: Platelets 262 08/07/2018: Hemoglobin 9.9; Potassium 4.9; Sodium 140   Recent Lipid Panel Lab Results  Component Value Date/Time   CHOL 526 (H) 06/12/2017 10:52 AM   TRIG 662 (H) 06/12/2017 10:52 AM   HDL 42 06/12/2017 10:52 AM   CHOLHDL 12.5 06/12/2017 10:52 AM   LDLCALC UNABLE TO CALCULATE IF TRIGLYCERIDE OVER 400 mg/dL 06/12/2017 10:52 AM    Wt Readings from Last 3 Encounters:  08/07/18 183 lb 6.4 oz (83.2 kg)  07/28/18 183 lb 6.4 oz (83.2 kg)  07/08/18 175 lb 3.2 oz (79.5 kg)     Objective:    Vital Signs:  There were no vitals taken for this visit.   Normal affect, normal speech pattern. Sounds comfortable in no distress  ASSESSMENT & PLAN:  1. Presumed CAD  - multiple admissions with elevated troponins in setting of severe anemia or volume overload. She has not had caridac chest pain during these admissions.  - she has not had ischemic testing due to her advanced kidney failure, and chronic anemia/GI bleed with inability to tolerate DAPT  - no recent symptoms, we will touch base with GI to see if can consider restarting either ASA or plavix.   2. HTN - above goal, increase hydralazine to 37.5mg  bid  3. Hyperlipidemia - intolerant to statins - request labs from pcp to see if recent panel. Need to see if we should conider statin alternative based on more recent panel         COVID-19 Education: The signs and symptoms of COVID-19 were discussed with the patient and how to seek care for testing (follow up with PCP or arrange E-visit).  The importance of social distancing was discussed today.  Patient Risk:   After full review of this patient's clinical status, I feel that they are at least moderate risk at this time.  Time:   Today, I  have spent 15 minutes with the patient with telehealth technology discussing the above medical problems. .     Medication Adjustments/Labs and Tests Ordered: Current medicines are reviewed at length with the patient today.  Concerns regarding medicines are outlined above.  Tests Ordered: No orders of the defined types were placed in this encounter.  Medication Changes: No orders of the defined types were placed in this encounter.   Disposition:  Follow up 4 months  Signed, Carlyle Dolly, MD  08/29/2018 11:14 AM    [place]

## 2018-09-01 ENCOUNTER — Other Ambulatory Visit: Payer: Self-pay

## 2018-09-02 ENCOUNTER — Inpatient Hospital Stay (HOSPITAL_COMMUNITY): Payer: Medicare HMO

## 2018-09-02 ENCOUNTER — Inpatient Hospital Stay (HOSPITAL_COMMUNITY): Payer: Medicare HMO | Attending: Hematology

## 2018-09-02 ENCOUNTER — Other Ambulatory Visit: Payer: Self-pay

## 2018-09-02 DIAGNOSIS — D631 Anemia in chronic kidney disease: Secondary | ICD-10-CM

## 2018-09-02 DIAGNOSIS — D638 Anemia in other chronic diseases classified elsewhere: Secondary | ICD-10-CM | POA: Diagnosis not present

## 2018-09-02 DIAGNOSIS — N183 Chronic kidney disease, stage 3 (moderate): Secondary | ICD-10-CM | POA: Diagnosis not present

## 2018-09-02 DIAGNOSIS — Z79899 Other long term (current) drug therapy: Secondary | ICD-10-CM | POA: Insufficient documentation

## 2018-09-02 DIAGNOSIS — N184 Chronic kidney disease, stage 4 (severe): Secondary | ICD-10-CM

## 2018-09-02 DIAGNOSIS — E611 Iron deficiency: Secondary | ICD-10-CM | POA: Diagnosis not present

## 2018-09-02 LAB — CBC
HCT: 35.1 % — ABNORMAL LOW (ref 36.0–46.0)
Hemoglobin: 11.1 g/dL — ABNORMAL LOW (ref 12.0–15.0)
MCH: 30.3 pg (ref 26.0–34.0)
MCHC: 31.6 g/dL (ref 30.0–36.0)
MCV: 95.9 fL (ref 80.0–100.0)
Platelets: 267 10*3/uL (ref 150–400)
RBC: 3.66 MIL/uL — ABNORMAL LOW (ref 3.87–5.11)
RDW: 14.9 % (ref 11.5–15.5)
WBC: 7.9 10*3/uL (ref 4.0–10.5)
nRBC: 0 % (ref 0.0–0.2)

## 2018-09-02 NOTE — Progress Notes (Signed)
Hgb 11.1. Retacrit held per parameters.

## 2018-09-15 ENCOUNTER — Encounter: Payer: Medicare HMO | Admitting: Vascular Surgery

## 2018-09-16 ENCOUNTER — Encounter: Payer: Medicare HMO | Admitting: Vascular Surgery

## 2018-09-17 ENCOUNTER — Telehealth: Payer: Self-pay

## 2018-09-17 MED ORDER — CLOPIDOGREL BISULFATE 75 MG PO TABS: 75.0000 mg | ORAL_TABLET | Freq: Every day | ORAL | 3 refills | Status: DC

## 2018-09-17 NOTE — Telephone Encounter (Signed)
Called pt. No answer, left message for pt to return call. Medicine sent in.

## 2018-09-17 NOTE — Telephone Encounter (Signed)
-----   Message from Arnoldo Lenis, MD sent at 09/17/2018  8:37 AM EDT ----- Can we let patient know we heart back from GI, they are ok restarting plavix, please start plavix 75mg  daily   Zandra Abts MD ----- Message ----- From: Annitta Needs, NP Sent: 09/16/2018   4:29 PM EDT To: Arnoldo Lenis, MD  Hi! Spoke with Dr. Gala Romney. Would rather she resume plavix and avoid any aspirin or other NSAIDs. Thanks!  ----- Message ----- From: Arnoldo Lenis, MD Sent: 08/29/2018   2:49 PM EDT To: Annitta Needs, NP  Mutual patient of ours, what are yours and Dr Roseanne Kaufman thoughts about her restarting either aspirin or plavix. We suspect she has CAD, she had a GI bleed 1 year ago. I know you are do to see her later this month    Zandra Abts MD

## 2018-09-18 ENCOUNTER — Telehealth: Payer: Self-pay

## 2018-09-18 NOTE — Telephone Encounter (Signed)
-----   Message from Arnoldo Lenis, MD sent at 09/17/2018  8:37 AM EDT ----- Can we let patient know we heart back from GI, they are ok restarting plavix, please start plavix 75mg  daily   Zandra Abts MD ----- Message ----- From: Annitta Needs, NP Sent: 09/16/2018   4:29 PM EDT To: Arnoldo Lenis, MD  Hi! Spoke with Dr. Gala Romney. Would rather she resume plavix and avoid any aspirin or other NSAIDs. Thanks!  ----- Message ----- From: Arnoldo Lenis, MD Sent: 08/29/2018   2:49 PM EDT To: Annitta Needs, NP  Mutual patient of ours, what are yours and Dr Roseanne Kaufman thoughts about her restarting either aspirin or plavix. We suspect she has CAD, she had a GI bleed 1 year ago. I know you are do to see her later this month    Zandra Abts MD

## 2018-09-18 NOTE — Telephone Encounter (Signed)
Called pt., informed her to restart PLavix (per Dr. Harl Bowie) She voiced understanding.

## 2018-09-24 DIAGNOSIS — J181 Lobar pneumonia, unspecified organism: Secondary | ICD-10-CM | POA: Diagnosis not present

## 2018-09-24 DIAGNOSIS — J189 Pneumonia, unspecified organism: Secondary | ICD-10-CM | POA: Diagnosis not present

## 2018-09-24 DIAGNOSIS — I5031 Acute diastolic (congestive) heart failure: Secondary | ICD-10-CM | POA: Diagnosis not present

## 2018-09-24 DIAGNOSIS — E1142 Type 2 diabetes mellitus with diabetic polyneuropathy: Secondary | ICD-10-CM | POA: Diagnosis not present

## 2018-09-24 DIAGNOSIS — R790 Abnormal level of blood mineral: Secondary | ICD-10-CM | POA: Diagnosis not present

## 2018-09-24 DIAGNOSIS — J9811 Atelectasis: Secondary | ICD-10-CM | POA: Diagnosis not present

## 2018-09-24 DIAGNOSIS — R0602 Shortness of breath: Secondary | ICD-10-CM | POA: Diagnosis not present

## 2018-09-24 DIAGNOSIS — J9600 Acute respiratory failure, unspecified whether with hypoxia or hypercapnia: Secondary | ICD-10-CM | POA: Diagnosis not present

## 2018-09-24 DIAGNOSIS — F419 Anxiety disorder, unspecified: Secondary | ICD-10-CM | POA: Diagnosis not present

## 2018-09-24 DIAGNOSIS — Z20828 Contact with and (suspected) exposure to other viral communicable diseases: Secondary | ICD-10-CM | POA: Diagnosis not present

## 2018-09-24 DIAGNOSIS — J810 Acute pulmonary edema: Secondary | ICD-10-CM | POA: Diagnosis not present

## 2018-09-24 DIAGNOSIS — N189 Chronic kidney disease, unspecified: Secondary | ICD-10-CM | POA: Diagnosis not present

## 2018-09-24 DIAGNOSIS — I10 Essential (primary) hypertension: Secondary | ICD-10-CM | POA: Diagnosis not present

## 2018-09-24 DIAGNOSIS — J90 Pleural effusion, not elsewhere classified: Secondary | ICD-10-CM | POA: Diagnosis not present

## 2018-09-24 DIAGNOSIS — R0902 Hypoxemia: Secondary | ICD-10-CM | POA: Diagnosis not present

## 2018-09-24 DIAGNOSIS — J9621 Acute and chronic respiratory failure with hypoxia: Secondary | ICD-10-CM | POA: Diagnosis not present

## 2018-09-24 DIAGNOSIS — E119 Type 2 diabetes mellitus without complications: Secondary | ICD-10-CM | POA: Diagnosis not present

## 2018-09-24 DIAGNOSIS — I130 Hypertensive heart and chronic kidney disease with heart failure and stage 1 through stage 4 chronic kidney disease, or unspecified chronic kidney disease: Secondary | ICD-10-CM | POA: Diagnosis not present

## 2018-09-24 DIAGNOSIS — R531 Weakness: Secondary | ICD-10-CM | POA: Diagnosis not present

## 2018-09-24 DIAGNOSIS — N183 Chronic kidney disease, stage 3 (moderate): Secondary | ICD-10-CM | POA: Diagnosis not present

## 2018-09-24 DIAGNOSIS — E1122 Type 2 diabetes mellitus with diabetic chronic kidney disease: Secondary | ICD-10-CM | POA: Diagnosis not present

## 2018-09-25 ENCOUNTER — Other Ambulatory Visit: Payer: Self-pay

## 2018-09-25 ENCOUNTER — Telehealth: Payer: Self-pay

## 2018-09-25 ENCOUNTER — Ambulatory Visit: Payer: Medicare HMO | Admitting: Gastroenterology

## 2018-09-25 NOTE — Telephone Encounter (Signed)
APPOINTMENT CANCELLED 

## 2018-09-25 NOTE — Telephone Encounter (Signed)
Tried to call pt for [facility] visit, friend stated she is in hospital at [facility]. He took her to hospital yesterday.  Erline Levine, please cancel visit for today.

## 2018-09-30 ENCOUNTER — Other Ambulatory Visit: Payer: Self-pay

## 2018-09-30 ENCOUNTER — Encounter (HOSPITAL_COMMUNITY): Payer: Self-pay | Admitting: Hematology

## 2018-09-30 ENCOUNTER — Inpatient Hospital Stay (HOSPITAL_COMMUNITY): Payer: Medicare HMO | Admitting: Hematology

## 2018-09-30 ENCOUNTER — Inpatient Hospital Stay (HOSPITAL_COMMUNITY): Payer: Medicare HMO

## 2018-09-30 ENCOUNTER — Inpatient Hospital Stay (HOSPITAL_COMMUNITY): Payer: Medicare HMO | Attending: Hematology

## 2018-09-30 ENCOUNTER — Encounter (HOSPITAL_COMMUNITY): Payer: Self-pay

## 2018-09-30 VITALS — BP 134/53 | HR 72 | Temp 97.6°F | Wt 172.6 lb

## 2018-09-30 DIAGNOSIS — D649 Anemia, unspecified: Secondary | ICD-10-CM

## 2018-09-30 DIAGNOSIS — I130 Hypertensive heart and chronic kidney disease with heart failure and stage 1 through stage 4 chronic kidney disease, or unspecified chronic kidney disease: Secondary | ICD-10-CM

## 2018-09-30 DIAGNOSIS — E1122 Type 2 diabetes mellitus with diabetic chronic kidney disease: Secondary | ICD-10-CM

## 2018-09-30 DIAGNOSIS — D631 Anemia in chronic kidney disease: Secondary | ICD-10-CM

## 2018-09-30 DIAGNOSIS — Z7902 Long term (current) use of antithrombotics/antiplatelets: Secondary | ICD-10-CM | POA: Diagnosis not present

## 2018-09-30 DIAGNOSIS — D509 Iron deficiency anemia, unspecified: Secondary | ICD-10-CM

## 2018-09-30 DIAGNOSIS — Z7982 Long term (current) use of aspirin: Secondary | ICD-10-CM | POA: Diagnosis not present

## 2018-09-30 DIAGNOSIS — Z79899 Other long term (current) drug therapy: Secondary | ICD-10-CM

## 2018-09-30 DIAGNOSIS — N184 Chronic kidney disease, stage 4 (severe): Secondary | ICD-10-CM

## 2018-09-30 DIAGNOSIS — N183 Chronic kidney disease, stage 3 (moderate): Secondary | ICD-10-CM | POA: Diagnosis not present

## 2018-09-30 LAB — COMPREHENSIVE METABOLIC PANEL
ALT: 22 U/L (ref 0–44)
AST: 33 U/L (ref 15–41)
Albumin: 3.5 g/dL (ref 3.5–5.0)
Alkaline Phosphatase: 67 U/L (ref 38–126)
Anion gap: 14 (ref 5–15)
BUN: 103 mg/dL — ABNORMAL HIGH (ref 8–23)
CO2: 19 mmol/L — ABNORMAL LOW (ref 22–32)
Calcium: 8.9 mg/dL (ref 8.9–10.3)
Chloride: 107 mmol/L (ref 98–111)
Creatinine, Ser: 4.53 mg/dL — ABNORMAL HIGH (ref 0.44–1.00)
GFR calc Af Amer: 10 mL/min — ABNORMAL LOW (ref >60)
GFR calc non Af Amer: 9 mL/min — ABNORMAL LOW (ref >60)
Glucose, Bld: 134 mg/dL — ABNORMAL HIGH (ref 70–99)
Potassium: 4.9 mmol/L (ref 3.5–5.1)
Sodium: 140 mmol/L (ref 135–145)
Total Bilirubin: 0.4 mg/dL (ref 0.3–1.2)
Total Protein: 7.5 g/dL (ref 6.5–8.1)

## 2018-09-30 LAB — FOLATE: Folate: 51.6 ng/mL (ref >5.9)

## 2018-09-30 LAB — CBC WITH DIFFERENTIAL/PLATELET
Abs Immature Granulocytes: 0.02 10*3/uL (ref 0.00–0.07)
Basophils Absolute: 0.1 10*3/uL (ref 0.0–0.1)
Basophils Relative: 1 %
Eosinophils Absolute: 0.4 10*3/uL (ref 0.0–0.5)
Eosinophils Relative: 6 %
HCT: 32.7 % — ABNORMAL LOW (ref 36.0–46.0)
Hemoglobin: 10.5 g/dL — ABNORMAL LOW (ref 12.0–15.0)
Immature Granulocytes: 0 %
Lymphocytes Relative: 19 %
Lymphs Abs: 1.3 10*3/uL (ref 0.7–4.0)
MCH: 30.3 pg (ref 26.0–34.0)
MCHC: 32.1 g/dL (ref 30.0–36.0)
MCV: 94.2 fL (ref 80.0–100.0)
Monocytes Absolute: 0.7 10*3/uL (ref 0.1–1.0)
Monocytes Relative: 10 %
Neutro Abs: 4.4 10*3/uL (ref 1.7–7.7)
Neutrophils Relative %: 64 %
Platelets: 327 10*3/uL (ref 150–400)
RBC: 3.47 MIL/uL — ABNORMAL LOW (ref 3.87–5.11)
RDW: 14.1 % (ref 11.5–15.5)
WBC: 6.8 10*3/uL (ref 4.0–10.5)
nRBC: 0 % (ref 0.0–0.2)

## 2018-09-30 LAB — IRON AND TIBC
Iron: 48 ug/dL (ref 28–170)
Saturation Ratios: 21 % (ref 10.4–31.8)
TIBC: 227 ug/dL — ABNORMAL LOW (ref 250–450)
UIBC: 179 ug/dL

## 2018-09-30 LAB — VITAMIN B12: Vitamin B-12: 458 pg/mL (ref 180–914)

## 2018-09-30 LAB — FERRITIN: Ferritin: 1139 ng/mL — ABNORMAL HIGH (ref 11–307)

## 2018-09-30 MED ORDER — EPOETIN ALFA-EPBX 40000 UNIT/ML IJ SOLN
INTRAMUSCULAR | Status: AC
  Filled 2018-09-30: qty 1

## 2018-09-30 MED ORDER — EPOETIN ALFA-EPBX 40000 UNIT/ML IJ SOLN
40000.0000 [IU] | Freq: Once | INTRAMUSCULAR | Status: DC
  Administered 2018-09-30: 11:00:00 40000 [IU] via SUBCUTANEOUS

## 2018-09-30 NOTE — Progress Notes (Signed)
Per Dr. Delton Coombes, we will proceed with injection today. Hgb 10.5. Shot given without incident.

## 2018-09-30 NOTE — Progress Notes (Signed)
Per Dr. Delton Coombes, we will hold Retacrit injection today.

## 2018-09-30 NOTE — Assessment & Plan Note (Signed)
1.  Normocytic anemia: -This is from a combination of CKD and iron deficiency. -She is currently on aspirin and Plavix.  Denies any bleeding per rectum or melena. -She was previously treated with Aranesp, which was changed to Retacrit 40,000 units every 28 days on 02/14/2018. - Feraheme infusion on 07/11/2018 and 07/18/2018. - She was admitted to [facility] from 09/15/2018 through 09/28/2018 for pneumonia. -I reviewed her blood work.  Hemoglobin today is 10.5.  Ferritin is 1139 and percent saturation is 21.  A12 and folic acid was within normal limits. - She will continue monthly injections of retacrit. -I will see her back in 3 months with repeat labs.

## 2018-09-30 NOTE — Progress Notes (Signed)
[place], [place] [address]   CLINIC:  Medical Oncology/Hematology  PCP:  Doree Albee, MD [place] [address] [phone]   REASON FOR VISIT:  Follow-up for Normocytic anemia secondary to CKD  CURRENT THERAPY: Monthly Retacrit    INTERVAL HISTORY:  [name] 78 y.o. female  Presents today for follow up. She reports overall doing well. She denies any significant fatigue. She denies any spontaneous bleeding. She denies any abnormal weight loss or weight gain. Denies any nausea, vomiting, or diarrhea. Denies any abdominal pain. Continues on monthly Retacrit injections and PRN ferhaeme infusions, tolerating well.    REVIEW OF SYSTEMS:  Review of Systems  Constitutional: Positive for fatigue.  HENT:  Negative.   Eyes: Negative.   Respiratory: Negative.   Cardiovascular: Negative.   Gastrointestinal: Negative.   Endocrine: Negative.   Genitourinary: Negative.    Musculoskeletal: Negative.   Skin: Negative.   Neurological: Negative.   Hematological: Negative.   Psychiatric/Behavioral: Negative.      PAST MEDICAL/SURGICAL HISTORY:  Past Medical History:  Diagnosis Date  . Anemia   . Arthritis   . Blood transfusion without reported diagnosis   . Cataract   . Chronic anemia   . Chronic combined systolic and diastolic CHF (congestive heart failure) ([place])    a. 2D echo 08/2016 at [facility]: EF 50-55% with inferior wall HK, impaired LV filling, fair study.  . CKD (chronic kidney disease), stage III ([place])   . Diabetes ([place])   . Gastritis   . GERD (gastroesophageal reflux disease)   . Gout   . Headache   . HTN (hypertension)   . Hyperlipidemia   . Hypothyroidism   . Iron deficiency anemia 11/08/2016  . Normocytic anemia 10/26/2016  . NSTEMI (non-ST elevated myocardial infarction) ([place])    a. Complex admission 08/2016 - with severe hyperglycemia, AKI on CKD, severe anemia down to Hgb 6.8, acute combined CHF,  troponin of 8.5, cath deferred due to renal dysfunction.  . Thyroid disease   . Wears dentures    Past Surgical History:  Procedure Laterality Date  . BASCILIC VEIN TRANSPOSITION Right 06/12/2018   Procedure: FIRST STAGE BASCILIC VEIN TRANSPOSITION RIGHT ARM;  Surgeon: Rosetta Posner, MD;  Location: [facility];  Service: Vascular;  Laterality: Right;  . BASCILIC VEIN TRANSPOSITION Right 08/07/2018   Procedure: BASCILIC VEIN TRANSPOSITION SECOND STAGE RIGHT ARM;  Surgeon: Rosetta Posner, MD;  Location: [facility];  Service: Vascular;  Laterality: Right;  . CATARACT EXTRACTION     left  . COLONOSCOPY WITH PROPOFOL N/A 10/08/2016   5 mm transverse colon polyp note resected due to plavix. hemorrhoids  . ENTEROSCOPY N/A 08/08/2017   Procedure: ENTEROSCOPY;  Surgeon: Daneil Dolin, MD;  [name];  Service: Endoscopy;  Laterality: N/A;  . ESOPHAGOGASTRODUODENOSCOPY N/A 10/06/2016   mild chroni gastritis, negative H.pylori  . ESOPHAGOGASTRODUODENOSCOPY (EGD) WITH PROPOFOL N/A 03/12/2017   mild edema/erythema of stomach, small bowel biopsy with focal villous tip lymphocytosis, ?partially developed celiac  . 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 GIVENS CAPSULE STUDY  10/08/2016   normal  . GIVENS CAPSULE STUDY N/A 10/29/2016   occasional gastric erosion, unremarkable small bowel  . ORIF HUMERUS FRACTURE Left 02/07/2015   Procedure: OPEN REDUCTION  INTERNAL FIXATION (ORIF) PROXIMAL HUMERUS FRACTURE;  Surgeon: Marybelle Killings, MD;  Location: [facility];  Service: Orthopedics;  Laterality: Left;  . teeth extractions    . THYROID SURGERY    . TOTAL HIP ARTHROPLASTY Left 02/07/2015   Procedure: TOTAL HIP ARTHROPLASTY ANTERIOR APPROACH ;  Surgeon: Marybelle Killings, MD;  Location: [facility];   Service: Orthopedics;  Laterality: Left;  . WRIST SURGERY Left      SOCIAL HISTORY:  Social History   Socioeconomic History  . Marital status: Widowed    Spouse name: [name]  . Number of children: 2  . Years of education: 75  . Highest education level: Not on file  Occupational History  . Occupation: retired  Scientific laboratory technician  . Financial resource strain: Not on file  . Food insecurity:    Worry: Not on file    Inability: Not on file  . Transportation needs:    Medical: Not on file    Non-medical: Not on file  Tobacco Use  . Smoking status: Never Smoker  . Smokeless tobacco: Never Used  Substance and Sexual Activity  . Alcohol use: No    Alcohol/week: 0.0 standard drinks  . Drug use: No  . Sexual activity: Not Currently  Lifestyle  . Physical activity:    Days per week: Not on file    Minutes per session: Not on file  . Stress: Not on file  Relationships  . Social connections:    Talks on phone: Not on file    Gets together: Not on file    Attends religious service: Not on file    Active member of club or organization: Not on file    Attends meetings of clubs or organizations: Not on file    Relationship status: Not on file  . Intimate partner violence:    Fear of current or ex partner: Not on file    Emotionally abused: Not on file    Physically abused: Not on file    Forced sexual activity: Not on file  Other Topics Concern  . Not on file  Social History Narrative   Lives with significant other/partner [name]   He is her caregiver   Moved from [facility]    FAMILY HISTORY:  Family History  Problem Relation Age of Onset  . Diabetes Mother   . Asthma Mother   . Early death Mother 59       Pneumonia  . Early death Father        Killed at rodeo  . CAD Neg Hx   . GI Bleed Neg Hx     CURRENT MEDICATIONS:  Outpatient Encounter Medications as of 09/30/2018  Medication Sig  . Alcohol Swabs (B-D SINGLE USE SWABS REGULAR) PADS   . amLODipine (NORVASC) 10 MG tablet Take 1  tablet (10 mg total) by mouth daily.  Marland Kitchen amoxicillin-clavulanate (AUGMENTIN) 875-125 MG tablet   . BD PEN NEEDLE MICRO U/F 32G X 6 MM MISC   . calcium acetate (PHOSLO) 667 MG capsule Take 667 mg by mouth 3 (three) times daily with meals.  . Cholecalciferol (VITAMIN D-3) 125 MCG (5000 UT) TABS Take 5,000 mg by mouth 3 (three) times a week.   . clopidogrel (PLAVIX) 75 MG tablet Take 1 tablet (75 mg total) by mouth daily.  Marland Kitchen docusate sodium (COLACE) 100 MG capsule Take 1 capsule (100 mg total) by mouth every 12 (twelve) hours. (Patient taking differently: Take 100 mg by mouth daily. )  . furosemide (LASIX)  20 MG tablet TAKE 1 TABLET BY MOUTH AS NEEDED ONLY  HAS  TO  TAKE  IT  WHEN  SHE  GAINS  2  POUNDS (Patient taking differently: Take 60 mg by mouth daily. )  . gabapentin (NEURONTIN) 300 MG capsule Take 2 capsules (600 mg total) by mouth at bedtime.  Marland Kitchen glucose blood test strip 1 each by Other route 4 (four) times daily. Use as instructed bid. E11.65  . hydrALAZINE (APRESOLINE) 25 MG tablet Take 1.5 tablets (37.5 mg total) by mouth 2 (two) times daily.  . Insulin Glargine (TOUJEO SOLOSTAR [place]) Inject 5 Units into the skin at bedtime.  . metoprolol tartrate (LOPRESSOR) 25 MG tablet Take 0.5 tablets (12.5 mg total) by mouth 2 (two) times daily.  . Multiple Vitamin (MULTIVITAMIN WITH MINERALS) TABS tablet Take 1 tablet by mouth daily.  . NP THYROID 120 MG tablet Take 120 mg by mouth daily.  . pantoprazole (PROTONIX) 40 MG tablet Take 1 tablet (40 mg total) by mouth 2 (two) times daily. 30 minutes before breakfast (Patient taking differently: Take 40 mg by mouth daily before breakfast. )  . sodium bicarbonate 650 MG tablet Take 650 mg by mouth 2 (two) times daily.  Marland Kitchen ULORIC 40 MG tablet Take 40 mg by mouth daily.   . vitamin C (ASCORBIC ACID) 500 MG tablet Take 500 mg by mouth daily.   Facility-Administered Encounter Medications as of 09/30/2018  Medication  . Darbepoetin Alfa (ARANESP) injection 200 mcg   . [COMPLETED] epoetin alfa-epbx (RETACRIT) injection 40,000 Units    ALLERGIES:  Allergies  Allergen Reactions  . Insulin Glargine Swelling    "Makes me swell like a balloon all over", including face, but without any respiratory distress or rashes. Associated with weight gain.  . Statins Other (See Comments)    "I've tried them all; my muscle aches were so bad I couldn't walk".     PHYSICAL EXAM:  ECOG Performance status: 1 Blood pressure is 134/53.  Pulse rate is 72.  Respiratory 17.  Temperature 98.  Saturations are 99%. Physical Exam Vitals signs reviewed.  Constitutional:      Appearance: Normal appearance.  Cardiovascular:     Rate and Rhythm: Normal rate and regular rhythm.     Heart sounds: Normal heart sounds.  Pulmonary:     Effort: Pulmonary effort is normal.     Breath sounds: Normal breath sounds.  Abdominal:     General: There is no distension.     Palpations: Abdomen is soft. There is no mass.  Musculoskeletal:        General: No swelling.  Skin:    General: Skin is warm.  Neurological:     General: No focal deficit present.     Mental Status: She is alert and oriented to person, place, and time.  Psychiatric:        Mood and Affect: Mood normal.        Behavior: Behavior normal.      LABORATORY DATA:  I have reviewed the labs as listed.  CBC    Component Value Date/Time   WBC 6.8 09/30/2018 1015   RBC 3.47 (L) 09/30/2018 1015   HGB 10.5 (L) 09/30/2018 1015   HCT 32.7 (L) 09/30/2018 1015   PLT 327 09/30/2018 1015   MCV 94.2 09/30/2018 1015   MCH 30.3 09/30/2018 1015   MCHC 32.1 09/30/2018 1015   RDW 14.1 09/30/2018 1015   LYMPHSABS 1.3 09/30/2018 1015   MONOABS 0.7 09/30/2018 1015  EOSABS 0.4 09/30/2018 1015   BASOSABS 0.1 09/30/2018 1015   CMP Latest Ref Rng & Units 09/30/2018 08/07/2018 07/28/2018  Glucose 70 - 99 mg/dL 134(H) 158(H) 148(H)  BUN 8 - 23 mg/dL 103(H) - 43(H)  Creatinine 0.44 - 1.00 mg/dL 4.53(H) - 3.23(H)  Sodium 135 - 145  mmol/L 140 140 142  Potassium 3.5 - 5.1 mmol/L 4.9 4.9 4.9  Chloride 98 - 111 mmol/L 107 - 112(H)  CO2 22 - 32 mmol/L 19(L) - 19(L)  Calcium 8.9 - 10.3 mg/dL 8.9 - 8.5(L)  Total Protein 6.5 - 8.1 g/dL 7.5 - -  Total Bilirubin 0.3 - 1.2 mg/dL 0.4 - -  Alkaline Phos 38 - 126 U/L 67 - -  AST 15 - 41 U/L 33 - -  ALT 0 - 44 U/L 22 - -       DIAGNOSTIC IMAGING:  I have independently reviewed the scans and discussed with the patient.   I have reviewed Elodia Florence, NP's note and agree with the documentation.  I personally performed a face-to-face visit, made revisions and my assessment and plan is as follows.    ASSESSMENT & PLAN:   Normocytic anemia 1.  Normocytic anemia: -This is from a combination of CKD and iron deficiency. -She is currently on aspirin and Plavix.  Denies any bleeding per rectum or melena. -She was previously treated with Aranesp, which was changed to Retacrit 40,000 units every 28 days on 02/14/2018. - Feraheme infusion on 07/11/2018 and 07/18/2018. - She was admitted to [facility] from 09/15/2018 through 09/28/2018 for pneumonia. -I reviewed her blood work.  Hemoglobin today is 10.5.  Ferritin is 1139 and percent saturation is 21.  X83 and folic acid was within normal limits. - She will continue monthly injections of retacrit. -I will see her back in 3 months with repeat labs.      Orders placed this encounter:  No orders of the defined types were placed in this encounter.     Derek Jack, MD [place] [phone]

## 2018-09-30 NOTE — Patient Instructions (Signed)
[facility] at [facility]  Discharge Instructions:  Retacrit injection received today. _______________________________________________________________  Thank you for choosing [facility] at [facility] to provide your oncology and hematology care.  To afford each patient quality time with our providers, please arrive at least 15 minutes before your scheduled appointment.  You need to re-schedule your appointment if you arrive 10 or more minutes late.  We strive to give you quality time with our providers, and arriving late affects you and other patients whose appointments are after yours.  Also, if you no show three or more times for appointments you may be dismissed from the clinic.  Again, thank you for choosing [facility] at [facility]. Our hope is that these requests will allow you access to exceptional care and in a timely manner. _______________________________________________________________  If you have questions after your visit, please contact our office at [phone] between the hours of 8:30 a.m. and 5:00 p.m. Voicemails left after 4:30 p.m. will not be returned until the following business day. _______________________________________________________________  For prescription refill requests, have your pharmacy contact our office. _______________________________________________________________  Recommendations made by the consultant and any test results will be sent to your referring physician. _______________________________________________________________ 

## 2018-10-06 DIAGNOSIS — J189 Pneumonia, unspecified organism: Secondary | ICD-10-CM | POA: Diagnosis not present

## 2018-10-15 ENCOUNTER — Ambulatory Visit (INDEPENDENT_AMBULATORY_CARE_PROVIDER_SITE_OTHER): Payer: Medicare HMO | Admitting: Internal Medicine

## 2018-10-27 ENCOUNTER — Telehealth (HOSPITAL_COMMUNITY): Payer: Self-pay | Admitting: Rehabilitation

## 2018-10-27 NOTE — Telephone Encounter (Signed)

## 2018-10-28 ENCOUNTER — Other Ambulatory Visit (HOSPITAL_COMMUNITY): Payer: Self-pay | Admitting: *Deleted

## 2018-10-28 ENCOUNTER — Encounter: Payer: Self-pay | Admitting: Vascular Surgery

## 2018-10-28 ENCOUNTER — Ambulatory Visit (INDEPENDENT_AMBULATORY_CARE_PROVIDER_SITE_OTHER): Payer: Medicare HMO | Admitting: Vascular Surgery

## 2018-10-28 ENCOUNTER — Other Ambulatory Visit: Payer: Self-pay

## 2018-10-28 VITALS — BP 147/61 | HR 69 | Temp 97.1°F | Resp 20 | Ht 64.0 in | Wt 175.7 lb

## 2018-10-28 DIAGNOSIS — D509 Iron deficiency anemia, unspecified: Secondary | ICD-10-CM

## 2018-10-28 DIAGNOSIS — D631 Anemia in chronic kidney disease: Secondary | ICD-10-CM

## 2018-10-28 DIAGNOSIS — N185 Chronic kidney disease, stage 5: Secondary | ICD-10-CM

## 2018-10-28 DIAGNOSIS — D649 Anemia, unspecified: Secondary | ICD-10-CM

## 2018-10-28 DIAGNOSIS — D500 Iron deficiency anemia secondary to blood loss (chronic): Secondary | ICD-10-CM

## 2018-10-28 NOTE — Progress Notes (Signed)
Patient name: [name] MRN: [id number] DOB: [birth year] Sex: female  REASON FOR VISIT: Follow-up second stage right basilic vein transposition fistula  HPI: [name] is a 78 y.o. female here today for follow-up.  She underwent second stage basilic vein transposition approximately 6 weeks ago.  She has no steal symptoms.  She has not yet on hemodialysis  Current Outpatient Medications  Medication Sig Dispense Refill  . Alcohol Swabs (B-D SINGLE USE SWABS REGULAR) PADS     . amLODipine (NORVASC) 10 MG tablet Take 1 tablet (10 mg total) by mouth daily. 90 tablet 3  . BD PEN NEEDLE MICRO U/F 32G X 6 MM MISC     . calcium acetate (PHOSLO) 667 MG capsule Take 667 mg by mouth 3 (three) times daily with meals.    . Cholecalciferol (VITAMIN D-3) 125 MCG (5000 UT) TABS Take 5,000 mg by mouth 3 (three) times a week.     . clopidogrel (PLAVIX) 75 MG tablet Take 1 tablet (75 mg total) by mouth daily. 90 tablet 3  . docusate sodium (COLACE) 100 MG capsule Take 1 capsule (100 mg total) by mouth every 12 (twelve) hours. (Patient taking differently: Take 100 mg by mouth daily. ) 60 capsule 0  . furosemide (LASIX) 20 MG tablet TAKE 1 TABLET BY MOUTH AS NEEDED ONLY  HAS  TO  TAKE  IT  WHEN  SHE  GAINS  2  POUNDS (Patient taking differently: Take 60 mg by mouth daily. ) 30 tablet 0  . gabapentin (NEURONTIN) 300 MG capsule Take 2 capsules (600 mg total) by mouth at bedtime. 180 capsule 0  . glucose blood test strip 1 each by Other route 4 (four) times daily. Use as instructed bid. E11.65 400 each 2  . hydrALAZINE (APRESOLINE) 25 MG tablet Take 1.5 tablets (37.5 mg total) by mouth 2 (two) times daily. 270 tablet 1  . Insulin Glargine (TOUJEO SOLOSTAR [place]) Inject 5 Units into the skin at bedtime.    . metoprolol tartrate (LOPRESSOR) 25 MG tablet Take 0.5 tablets (12.5 mg total) by mouth 2 (two) times daily. 30 tablet 0  . Multiple Vitamin (MULTIVITAMIN WITH MINERALS)  TABS tablet Take 1 tablet by mouth daily.    . NP THYROID 120 MG tablet Take 120 mg by mouth daily.    . pantoprazole (PROTONIX) 40 MG tablet Take 1 tablet (40 mg total) by mouth 2 (two) times daily. 30 minutes before breakfast (Patient taking differently: Take 40 mg by mouth daily before breakfast. ) 180 tablet 3  . sodium bicarbonate 650 MG tablet Take 650 mg by mouth 2 (two) times daily.    Marland Kitchen ULORIC 40 MG tablet Take 40 mg by mouth daily.   3  . vitamin C (ASCORBIC ACID) 500 MG tablet Take 500 mg by mouth daily.     No current facility-administered medications for this visit.    Facility-Administered Medications Ordered in Other Visits  Medication Dose Route Frequency Provider Last Rate Last Dose  . Darbepoetin Alfa (ARANESP) injection 200 mcg  200 mcg Subcutaneous Once Higgs, Vetta, MD         PHYSICAL EXAM: Vitals:   10/28/18 1409  BP: (!) 147/61  Pulse: 69  Resp: 20  Temp: (!) 97.1 F (36.2 C)  SpO2: 97%  Weight: 175 lb 11.2 oz (79.7 kg)  Height: 5\' 4"  (1.626 m)    GENERAL: The patient is a well-nourished female, in no acute distress. The vital signs are documented above.  Excellent thrill with a very superficial vein with good diameter.  Palpable radial pulse  MEDICAL ISSUES: Successful second stage basilic vein transposition fistula.  Will continue follow-up with nephrology.  Should be acceptable for use if hemodialysis is required   Rosetta Posner, MD [facility] Tel [phone] Pager [phone]

## 2018-10-29 ENCOUNTER — Inpatient Hospital Stay (HOSPITAL_COMMUNITY): Payer: Medicare HMO

## 2018-10-29 ENCOUNTER — Encounter (HOSPITAL_COMMUNITY): Payer: Self-pay

## 2018-10-29 ENCOUNTER — Other Ambulatory Visit (HOSPITAL_COMMUNITY)
Admission: RE | Admit: 2018-10-29 | Discharge: 2018-10-29 | Disposition: A | Discharge status: Home / Self Care | Payer: Medicare HMO | Source: Ambulatory Visit | Attending: Nephrology | Admitting: Nephrology

## 2018-10-29 ENCOUNTER — Inpatient Hospital Stay (HOSPITAL_COMMUNITY): Payer: Medicare HMO | Attending: Hematology

## 2018-10-29 VITALS — BP 137/43 | HR 63 | Temp 98.3°F | Resp 18

## 2018-10-29 DIAGNOSIS — D631 Anemia in chronic kidney disease: Secondary | ICD-10-CM

## 2018-10-29 DIAGNOSIS — D638 Anemia in other chronic diseases classified elsewhere: Secondary | ICD-10-CM | POA: Insufficient documentation

## 2018-10-29 DIAGNOSIS — D500 Iron deficiency anemia secondary to blood loss (chronic): Secondary | ICD-10-CM

## 2018-10-29 DIAGNOSIS — N184 Chronic kidney disease, stage 4 (severe): Secondary | ICD-10-CM | POA: Diagnosis not present

## 2018-10-29 DIAGNOSIS — D649 Anemia, unspecified: Secondary | ICD-10-CM

## 2018-10-29 DIAGNOSIS — D509 Iron deficiency anemia, unspecified: Secondary | ICD-10-CM

## 2018-10-29 DIAGNOSIS — I509 Heart failure, unspecified: Secondary | ICD-10-CM | POA: Insufficient documentation

## 2018-10-29 DIAGNOSIS — N183 Chronic kidney disease, stage 3 (moderate): Secondary | ICD-10-CM | POA: Diagnosis not present

## 2018-10-29 DIAGNOSIS — R809 Proteinuria, unspecified: Secondary | ICD-10-CM | POA: Insufficient documentation

## 2018-10-29 DIAGNOSIS — E559 Vitamin D deficiency, unspecified: Secondary | ICD-10-CM | POA: Diagnosis not present

## 2018-10-29 LAB — PROTEIN / CREATININE RATIO, URINE
Creatinine, Urine: 40.5 mg/dL
Protein Creatinine Ratio: 2.77 mg/mg{Cre} — ABNORMAL HIGH (ref 0.00–0.15)
Total Protein, Urine: 112 mg/dL

## 2018-10-29 LAB — RENAL FUNCTION PANEL
Albumin: 3.7 g/dL (ref 3.5–5.0)
Anion gap: 14 (ref 5–15)
BUN: 84 mg/dL — ABNORMAL HIGH (ref 8–23)
CO2: 17 mmol/L — ABNORMAL LOW (ref 22–32)
Calcium: 9 mg/dL (ref 8.9–10.3)
Chloride: 108 mmol/L (ref 98–111)
Creatinine, Ser: 3.89 mg/dL — ABNORMAL HIGH (ref 0.44–1.00)
GFR calc Af Amer: 12 mL/min — ABNORMAL LOW (ref >60)
GFR calc non Af Amer: 10 mL/min — ABNORMAL LOW (ref >60)
Glucose, Bld: 106 mg/dL — ABNORMAL HIGH (ref 70–99)
Phosphorus: 5.7 mg/dL — ABNORMAL HIGH (ref 2.5–4.6)
Potassium: 5.4 mmol/L — ABNORMAL HIGH (ref 3.5–5.1)
Sodium: 139 mmol/L (ref 135–145)

## 2018-10-29 LAB — CBC
HCT: 30.5 % — ABNORMAL LOW (ref 36.0–46.0)
Hemoglobin: 9.9 g/dL — ABNORMAL LOW (ref 12.0–15.0)
MCH: 30.8 pg (ref 26.0–34.0)
MCHC: 32.5 g/dL (ref 30.0–36.0)
MCV: 95 fL (ref 80.0–100.0)
Platelets: 268 10*3/uL (ref 150–400)
RBC: 3.21 MIL/uL — ABNORMAL LOW (ref 3.87–5.11)
RDW: 14.1 % (ref 11.5–15.5)
WBC: 6.3 10*3/uL (ref 4.0–10.5)
nRBC: 0 % (ref 0.0–0.2)

## 2018-10-29 LAB — IRON AND TIBC
Iron: 54 ug/dL (ref 28–170)
Saturation Ratios: 22 % (ref 10.4–31.8)
TIBC: 242 ug/dL — ABNORMAL LOW (ref 250–450)
UIBC: 188 ug/dL

## 2018-10-29 LAB — FERRITIN: Ferritin: 525 ng/mL — ABNORMAL HIGH (ref 11–307)

## 2018-10-29 MED ORDER — EPOETIN ALFA-EPBX 40000 UNIT/ML IJ SOLN
40000.0000 [IU] | Freq: Once | INTRAMUSCULAR | Status: AC
  Administered 2018-10-29: 40000 [IU] via SUBCUTANEOUS
  Filled 2018-10-29: qty 1

## 2018-10-29 NOTE — Progress Notes (Signed)
Patient tolerated injection with no complaints voiced.  Site clean and dry with no bruising or swelling noted at site.  Band aid applied.  Vss with discharge and left ambulatory with no s/s of distress noted.  

## 2018-10-30 DIAGNOSIS — L97519 Non-pressure chronic ulcer of other part of right foot with unspecified severity: Secondary | ICD-10-CM | POA: Diagnosis not present

## 2018-10-30 DIAGNOSIS — L97511 Non-pressure chronic ulcer of other part of right foot limited to breakdown of skin: Secondary | ICD-10-CM | POA: Diagnosis not present

## 2018-10-30 DIAGNOSIS — M19071 Primary osteoarthritis, right ankle and foot: Secondary | ICD-10-CM | POA: Diagnosis not present

## 2018-10-30 LAB — HEPATITIS C ANTIBODY: HCV Ab: <0.1 s/co ratio (ref 0.0–0.9)

## 2018-10-30 LAB — PARATHYROID HORMONE, INTACT (NO CA): PTH: 114 pg/mL — ABNORMAL HIGH (ref 15–65)

## 2018-10-30 LAB — VITAMIN D 25 HYDROXY (VIT D DEFICIENCY, FRACTURES): Vit D, 25-Hydroxy: 21.5 ng/mL — ABNORMAL LOW (ref 30.0–100.0)

## 2018-10-30 LAB — HEPATITIS B SURFACE ANTIGEN: Hepatitis B Surface Ag: NEGATIVE

## 2018-11-05 DIAGNOSIS — N185 Chronic kidney disease, stage 5: Secondary | ICD-10-CM | POA: Diagnosis not present

## 2018-11-05 DIAGNOSIS — R809 Proteinuria, unspecified: Secondary | ICD-10-CM | POA: Diagnosis not present

## 2018-11-05 DIAGNOSIS — E8889 Other specified metabolic disorders: Secondary | ICD-10-CM | POA: Diagnosis not present

## 2018-11-05 DIAGNOSIS — E039 Hypothyroidism, unspecified: Secondary | ICD-10-CM | POA: Diagnosis not present

## 2018-11-05 DIAGNOSIS — I509 Heart failure, unspecified: Secondary | ICD-10-CM | POA: Diagnosis not present

## 2018-11-05 DIAGNOSIS — H66009 Acute suppurative otitis media without spontaneous rupture of ear drum, unspecified ear: Secondary | ICD-10-CM | POA: Diagnosis not present

## 2018-11-05 DIAGNOSIS — I129 Hypertensive chronic kidney disease with stage 1 through stage 4 chronic kidney disease, or unspecified chronic kidney disease: Secondary | ICD-10-CM | POA: Diagnosis not present

## 2018-11-05 DIAGNOSIS — E119 Type 2 diabetes mellitus without complications: Secondary | ICD-10-CM | POA: Diagnosis not present

## 2018-11-05 DIAGNOSIS — E559 Vitamin D deficiency, unspecified: Secondary | ICD-10-CM | POA: Diagnosis not present

## 2018-11-06 ENCOUNTER — Emergency Department (HOSPITAL_COMMUNITY): Payer: Medicare HMO

## 2018-11-06 ENCOUNTER — Other Ambulatory Visit: Payer: Self-pay

## 2018-11-06 ENCOUNTER — Encounter (HOSPITAL_COMMUNITY): Payer: Self-pay

## 2018-11-06 ENCOUNTER — Inpatient Hospital Stay (HOSPITAL_COMMUNITY)
Admission: EM | Admit: 2018-11-06 | Discharge: 2018-11-30 | DRG: 233 | Disposition: A | Discharge status: Home Health | Payer: Medicare HMO | Attending: Thoracic Surgery (Cardiothoracic Vascular Surgery) | Admitting: Thoracic Surgery (Cardiothoracic Vascular Surgery)

## 2018-11-06 DIAGNOSIS — J811 Chronic pulmonary edema: Secondary | ICD-10-CM | POA: Diagnosis not present

## 2018-11-06 DIAGNOSIS — Z1159 Encounter for screening for other viral diseases: Secondary | ICD-10-CM

## 2018-11-06 DIAGNOSIS — I5030 Unspecified diastolic (congestive) heart failure: Secondary | ICD-10-CM | POA: Diagnosis not present

## 2018-11-06 DIAGNOSIS — I132 Hypertensive heart and chronic kidney disease with heart failure and with stage 5 chronic kidney disease, or end stage renal disease: Secondary | ICD-10-CM | POA: Diagnosis present

## 2018-11-06 DIAGNOSIS — J189 Pneumonia, unspecified organism: Secondary | ICD-10-CM | POA: Diagnosis present

## 2018-11-06 DIAGNOSIS — L97509 Non-pressure chronic ulcer of other part of unspecified foot with unspecified severity: Secondary | ICD-10-CM | POA: Diagnosis present

## 2018-11-06 DIAGNOSIS — E11621 Type 2 diabetes mellitus with foot ulcer: Secondary | ICD-10-CM | POA: Diagnosis present

## 2018-11-06 DIAGNOSIS — I5043 Acute on chronic combined systolic (congestive) and diastolic (congestive) heart failure: Secondary | ICD-10-CM | POA: Diagnosis present

## 2018-11-06 DIAGNOSIS — I2584 Coronary atherosclerosis due to calcified coronary lesion: Secondary | ICD-10-CM | POA: Diagnosis present

## 2018-11-06 DIAGNOSIS — E1121 Type 2 diabetes mellitus with diabetic nephropathy: Secondary | ICD-10-CM | POA: Diagnosis present

## 2018-11-06 DIAGNOSIS — R5381 Other malaise: Secondary | ICD-10-CM | POA: Diagnosis not present

## 2018-11-06 DIAGNOSIS — D649 Anemia, unspecified: Secondary | ICD-10-CM | POA: Diagnosis not present

## 2018-11-06 DIAGNOSIS — E1169 Type 2 diabetes mellitus with other specified complication: Secondary | ICD-10-CM | POA: Diagnosis present

## 2018-11-06 DIAGNOSIS — N186 End stage renal disease: Secondary | ICD-10-CM | POA: Diagnosis not present

## 2018-11-06 DIAGNOSIS — E872 Acidosis: Secondary | ICD-10-CM | POA: Diagnosis not present

## 2018-11-06 DIAGNOSIS — I10 Essential (primary) hypertension: Secondary | ICD-10-CM | POA: Diagnosis not present

## 2018-11-06 DIAGNOSIS — R079 Chest pain, unspecified: Secondary | ICD-10-CM

## 2018-11-06 DIAGNOSIS — E1142 Type 2 diabetes mellitus with diabetic polyneuropathy: Secondary | ICD-10-CM | POA: Diagnosis present

## 2018-11-06 DIAGNOSIS — E033 Postinfectious hypothyroidism: Secondary | ICD-10-CM | POA: Diagnosis not present

## 2018-11-06 DIAGNOSIS — I340 Nonrheumatic mitral (valve) insufficiency: Secondary | ICD-10-CM | POA: Diagnosis not present

## 2018-11-06 DIAGNOSIS — R918 Other nonspecific abnormal finding of lung field: Secondary | ICD-10-CM | POA: Diagnosis not present

## 2018-11-06 DIAGNOSIS — I25110 Atherosclerotic heart disease of native coronary artery with unstable angina pectoris: Secondary | ICD-10-CM | POA: Diagnosis present

## 2018-11-06 DIAGNOSIS — R0602 Shortness of breath: Secondary | ICD-10-CM

## 2018-11-06 DIAGNOSIS — N19 Unspecified kidney failure: Secondary | ICD-10-CM | POA: Diagnosis not present

## 2018-11-06 DIAGNOSIS — I272 Pulmonary hypertension, unspecified: Secondary | ICD-10-CM | POA: Diagnosis present

## 2018-11-06 DIAGNOSIS — Z9842 Cataract extraction status, left eye: Secondary | ICD-10-CM

## 2018-11-06 DIAGNOSIS — E785 Hyperlipidemia, unspecified: Secondary | ICD-10-CM | POA: Diagnosis not present

## 2018-11-06 DIAGNOSIS — Z888 Allergy status to other drugs, medicaments and biological substances status: Secondary | ICD-10-CM

## 2018-11-06 DIAGNOSIS — J9601 Acute respiratory failure with hypoxia: Secondary | ICD-10-CM | POA: Diagnosis not present

## 2018-11-06 DIAGNOSIS — E782 Mixed hyperlipidemia: Secondary | ICD-10-CM | POA: Diagnosis present

## 2018-11-06 DIAGNOSIS — E1122 Type 2 diabetes mellitus with diabetic chronic kidney disease: Secondary | ICD-10-CM | POA: Diagnosis present

## 2018-11-06 DIAGNOSIS — I5033 Acute on chronic diastolic (congestive) heart failure: Secondary | ICD-10-CM | POA: Diagnosis present

## 2018-11-06 DIAGNOSIS — E118 Type 2 diabetes mellitus with unspecified complications: Secondary | ICD-10-CM | POA: Diagnosis not present

## 2018-11-06 DIAGNOSIS — I120 Hypertensive chronic kidney disease with stage 5 chronic kidney disease or end stage renal disease: Secondary | ICD-10-CM | POA: Diagnosis not present

## 2018-11-06 DIAGNOSIS — Z01810 Encounter for preprocedural cardiovascular examination: Secondary | ICD-10-CM | POA: Diagnosis not present

## 2018-11-06 DIAGNOSIS — I200 Unstable angina: Secondary | ICD-10-CM | POA: Diagnosis not present

## 2018-11-06 DIAGNOSIS — Z992 Dependence on renal dialysis: Secondary | ICD-10-CM | POA: Diagnosis not present

## 2018-11-06 DIAGNOSIS — I130 Hypertensive heart and chronic kidney disease with heart failure and stage 1 through stage 4 chronic kidney disease, or unspecified chronic kidney disease: Secondary | ICD-10-CM | POA: Diagnosis not present

## 2018-11-06 DIAGNOSIS — E875 Hyperkalemia: Secondary | ICD-10-CM | POA: Diagnosis not present

## 2018-11-06 DIAGNOSIS — N179 Acute kidney failure, unspecified: Secondary | ICD-10-CM | POA: Diagnosis not present

## 2018-11-06 DIAGNOSIS — I5032 Chronic diastolic (congestive) heart failure: Secondary | ICD-10-CM | POA: Diagnosis present

## 2018-11-06 DIAGNOSIS — J90 Pleural effusion, not elsewhere classified: Secondary | ICD-10-CM | POA: Diagnosis not present

## 2018-11-06 DIAGNOSIS — Z79890 Hormone replacement therapy (postmenopausal): Secondary | ICD-10-CM

## 2018-11-06 DIAGNOSIS — R0789 Other chest pain: Secondary | ICD-10-CM | POA: Diagnosis not present

## 2018-11-06 DIAGNOSIS — Z96642 Presence of left artificial hip joint: Secondary | ICD-10-CM | POA: Diagnosis present

## 2018-11-06 DIAGNOSIS — J9691 Respiratory failure, unspecified with hypoxia: Secondary | ICD-10-CM | POA: Diagnosis present

## 2018-11-06 DIAGNOSIS — Z825 Family history of asthma and other chronic lower respiratory diseases: Secondary | ICD-10-CM

## 2018-11-06 DIAGNOSIS — L8990 Pressure ulcer of unspecified site, unspecified stage: Secondary | ICD-10-CM | POA: Insufficient documentation

## 2018-11-06 DIAGNOSIS — I2510 Atherosclerotic heart disease of native coronary artery without angina pectoris: Secondary | ICD-10-CM

## 2018-11-06 DIAGNOSIS — D62 Acute posthemorrhagic anemia: Secondary | ICD-10-CM | POA: Diagnosis not present

## 2018-11-06 DIAGNOSIS — D631 Anemia in chronic kidney disease: Secondary | ICD-10-CM | POA: Diagnosis present

## 2018-11-06 DIAGNOSIS — I480 Paroxysmal atrial fibrillation: Secondary | ICD-10-CM | POA: Diagnosis not present

## 2018-11-06 DIAGNOSIS — E038 Other specified hypothyroidism: Secondary | ICD-10-CM | POA: Diagnosis not present

## 2018-11-06 DIAGNOSIS — R7989 Other specified abnormal findings of blood chemistry: Secondary | ICD-10-CM | POA: Diagnosis not present

## 2018-11-06 DIAGNOSIS — Z09 Encounter for follow-up examination after completed treatment for conditions other than malignant neoplasm: Secondary | ICD-10-CM

## 2018-11-06 DIAGNOSIS — N185 Chronic kidney disease, stage 5: Secondary | ICD-10-CM | POA: Diagnosis not present

## 2018-11-06 DIAGNOSIS — J9811 Atelectasis: Secondary | ICD-10-CM | POA: Diagnosis not present

## 2018-11-06 DIAGNOSIS — I252 Old myocardial infarction: Secondary | ICD-10-CM

## 2018-11-06 DIAGNOSIS — J9690 Respiratory failure, unspecified, unspecified whether with hypoxia or hypercapnia: Secondary | ICD-10-CM | POA: Diagnosis present

## 2018-11-06 DIAGNOSIS — K5900 Constipation, unspecified: Secondary | ICD-10-CM | POA: Diagnosis not present

## 2018-11-06 DIAGNOSIS — Z833 Family history of diabetes mellitus: Secondary | ICD-10-CM

## 2018-11-06 DIAGNOSIS — Z792 Long term (current) use of antibiotics: Secondary | ICD-10-CM

## 2018-11-06 DIAGNOSIS — Z951 Presence of aortocoronary bypass graft: Secondary | ICD-10-CM | POA: Diagnosis not present

## 2018-11-06 DIAGNOSIS — E039 Hypothyroidism, unspecified: Secondary | ICD-10-CM | POA: Diagnosis present

## 2018-11-06 DIAGNOSIS — Z794 Long term (current) use of insulin: Secondary | ICD-10-CM

## 2018-11-06 DIAGNOSIS — K922 Gastrointestinal hemorrhage, unspecified: Secondary | ICD-10-CM | POA: Diagnosis not present

## 2018-11-06 DIAGNOSIS — Z7902 Long term (current) use of antithrombotics/antiplatelets: Secondary | ICD-10-CM

## 2018-11-06 DIAGNOSIS — M109 Gout, unspecified: Secondary | ICD-10-CM | POA: Diagnosis present

## 2018-11-06 DIAGNOSIS — K219 Gastro-esophageal reflux disease without esophagitis: Secondary | ICD-10-CM | POA: Diagnosis present

## 2018-11-06 DIAGNOSIS — I361 Nonrheumatic tricuspid (valve) insufficiency: Secondary | ICD-10-CM | POA: Diagnosis not present

## 2018-11-06 DIAGNOSIS — I214 Non-ST elevation (NSTEMI) myocardial infarction: Principal | ICD-10-CM

## 2018-11-06 DIAGNOSIS — I517 Cardiomegaly: Secondary | ICD-10-CM | POA: Diagnosis not present

## 2018-11-06 DIAGNOSIS — R778 Other specified abnormalities of plasma proteins: Secondary | ICD-10-CM

## 2018-11-06 DIAGNOSIS — I509 Heart failure, unspecified: Secondary | ICD-10-CM | POA: Diagnosis not present

## 2018-11-06 DIAGNOSIS — R0600 Dyspnea, unspecified: Secondary | ICD-10-CM | POA: Diagnosis not present

## 2018-11-06 DIAGNOSIS — N2581 Secondary hyperparathyroidism of renal origin: Secondary | ICD-10-CM | POA: Diagnosis present

## 2018-11-06 DIAGNOSIS — I5031 Acute diastolic (congestive) heart failure: Secondary | ICD-10-CM | POA: Diagnosis not present

## 2018-11-06 DIAGNOSIS — I169 Hypertensive crisis, unspecified: Secondary | ICD-10-CM | POA: Diagnosis present

## 2018-11-06 DIAGNOSIS — Z79899 Other long term (current) drug therapy: Secondary | ICD-10-CM

## 2018-11-06 DIAGNOSIS — Z972 Presence of dental prosthetic device (complete) (partial): Secondary | ICD-10-CM

## 2018-11-06 DIAGNOSIS — R001 Bradycardia, unspecified: Secondary | ICD-10-CM | POA: Diagnosis not present

## 2018-11-06 LAB — BASIC METABOLIC PANEL
Anion gap: 18 — ABNORMAL HIGH (ref 5–15)
BUN: 100 mg/dL — ABNORMAL HIGH (ref 8–23)
CO2: 17 mmol/L — ABNORMAL LOW (ref 22–32)
Calcium: 8.5 mg/dL — ABNORMAL LOW (ref 8.9–10.3)
Chloride: 104 mmol/L (ref 98–111)
Creatinine, Ser: 6.82 mg/dL — ABNORMAL HIGH (ref 0.44–1.00)
GFR calc Af Amer: 6 mL/min — ABNORMAL LOW (ref >60)
GFR calc non Af Amer: 5 mL/min — ABNORMAL LOW (ref >60)
Glucose, Bld: 108 mg/dL — ABNORMAL HIGH (ref 70–99)
Potassium: 5.4 mmol/L — ABNORMAL HIGH (ref 3.5–5.1)
Sodium: 139 mmol/L (ref 135–145)

## 2018-11-06 LAB — CBC WITH DIFFERENTIAL/PLATELET
Abs Immature Granulocytes: 0.02 10*3/uL (ref 0.00–0.07)
Basophils Absolute: 0 10*3/uL (ref 0.0–0.1)
Basophils Relative: 0 %
Eosinophils Absolute: 0.1 10*3/uL (ref 0.0–0.5)
Eosinophils Relative: 1 %
HCT: 29 % — ABNORMAL LOW (ref 36.0–46.0)
Hemoglobin: 9 g/dL — ABNORMAL LOW (ref 12.0–15.0)
Immature Granulocytes: 0 %
Lymphocytes Relative: 9 %
Lymphs Abs: 0.9 10*3/uL (ref 0.7–4.0)
MCH: 30.6 pg (ref 26.0–34.0)
MCHC: 31 g/dL (ref 30.0–36.0)
MCV: 98.6 fL (ref 80.0–100.0)
Monocytes Absolute: 0.6 10*3/uL (ref 0.1–1.0)
Monocytes Relative: 6 %
Neutro Abs: 9 10*3/uL — ABNORMAL HIGH (ref 1.7–7.7)
Neutrophils Relative %: 84 %
Platelets: 225 10*3/uL (ref 150–400)
RBC: 2.94 MIL/uL — ABNORMAL LOW (ref 3.87–5.11)
RDW: 15.3 % (ref 11.5–15.5)
WBC: 10.7 10*3/uL — ABNORMAL HIGH (ref 4.0–10.5)
nRBC: 0 % (ref 0.0–0.2)

## 2018-11-06 LAB — D-DIMER, QUANTITATIVE: D-Dimer, Quant: 1.77 ug/mL-FEU — ABNORMAL HIGH (ref 0.00–0.50)

## 2018-11-06 LAB — TROPONIN I
Troponin I: 0.03 ng/mL (ref <0.03)
Troponin I: 0.04 ng/mL (ref <0.03)
Troponin I: 0.05 ng/mL (ref <0.03)

## 2018-11-06 LAB — SARS CORONAVIRUS 2 BY RT PCR (HOSPITAL ORDER, PERFORMED IN ~~LOC~~ HOSPITAL LAB): SARS Coronavirus 2: NEGATIVE

## 2018-11-06 LAB — TSH: TSH: 0.326 u[IU]/mL — ABNORMAL LOW (ref 0.350–4.500)

## 2018-11-06 LAB — CBG MONITORING, ED: Glucose-Capillary: 114 mg/dL — ABNORMAL HIGH (ref 70–99)

## 2018-11-06 MED ORDER — METOPROLOL TARTRATE 12.5 MG HALF TABLET
12.5000 mg | ORAL_TABLET | Freq: Two times a day (BID) | ORAL | Status: DC
  Administered 2018-11-06 – 2018-11-13 (×13): 12.5 mg via ORAL
  Filled 2018-11-06 (×17): qty 1

## 2018-11-06 MED ORDER — NITROGLYCERIN 0.4 MG SL SUBL
0.4000 mg | SUBLINGUAL_TABLET | SUBLINGUAL | Status: DC | PRN
  Administered 2018-11-07: 0.4 mg via SUBLINGUAL
  Filled 2018-11-06 (×2): qty 1

## 2018-11-06 MED ORDER — ONDANSETRON HCL 4 MG PO TABS: 4.0000 mg | ORAL_TABLET | Freq: Four times a day (QID) | ORAL | Status: DC | PRN

## 2018-11-06 MED ORDER — ACETAMINOPHEN 325 MG PO TABS: 650.0000 mg | ORAL_TABLET | Freq: Four times a day (QID) | ORAL | Status: DC | PRN

## 2018-11-06 MED ORDER — DOCUSATE SODIUM 100 MG PO CAPS
100.0000 mg | ORAL_CAPSULE | Freq: Two times a day (BID) | ORAL | Status: DC
  Administered 2018-11-06 – 2018-11-19 (×27): 100 mg via ORAL
  Filled 2018-11-06 (×27): qty 1

## 2018-11-06 MED ORDER — HYDRALAZINE HCL 25 MG PO TABS
37.5000 mg | ORAL_TABLET | Freq: Two times a day (BID) | ORAL | Status: DC
  Administered 2018-11-06 – 2018-11-19 (×22): 37.5 mg via ORAL
  Filled 2018-11-06 (×25): qty 2

## 2018-11-06 MED ORDER — HEPARIN SODIUM (PORCINE) 5000 UNIT/ML IJ SOLN
5000.0000 [IU] | Freq: Three times a day (TID) | INTRAMUSCULAR | Status: DC
  Administered 2018-11-06 – 2018-11-19 (×35): 5000 [IU] via SUBCUTANEOUS
  Filled 2018-11-06 (×36): qty 1

## 2018-11-06 MED ORDER — ORAL CARE MOUTH RINSE
15.0000 mL | Freq: Two times a day (BID) | OROMUCOSAL | Status: DC
  Administered 2018-11-07 – 2018-11-19 (×23): 15 mL via OROMUCOSAL

## 2018-11-06 MED ORDER — ONDANSETRON HCL 4 MG/2ML IJ SOLN
4.0000 mg | Freq: Four times a day (QID) | INTRAMUSCULAR | Status: DC | PRN
  Administered 2018-11-08 (×2): 4 mg via INTRAVENOUS
  Filled 2018-11-06 (×2): qty 2

## 2018-11-06 MED ORDER — THYROID 120 MG PO TABS
120.0000 mg | ORAL_TABLET | Freq: Every day | ORAL | Status: DC
  Administered 2018-11-07 – 2018-11-19 (×13): 120 mg via ORAL
  Filled 2018-11-06: qty 1
  Filled 2018-11-06 (×2): qty 4
  Filled 2018-11-06: qty 1
  Filled 2018-11-06: qty 4
  Filled 2018-11-06: qty 1
  Filled 2018-11-06 (×2): qty 4
  Filled 2018-11-06 (×4): qty 1
  Filled 2018-11-06 (×2): qty 4
  Filled 2018-11-06 (×3): qty 1

## 2018-11-06 MED ORDER — ASPIRIN EC 81 MG PO TBEC
81.0000 mg | DELAYED_RELEASE_TABLET | Freq: Every day | ORAL | Status: DC
  Administered 2018-11-07 – 2018-11-19 (×12): 81 mg via ORAL
  Filled 2018-11-06 (×13): qty 1

## 2018-11-06 MED ORDER — INSULIN ASPART 100 UNIT/ML ~~LOC~~ SOLN
0.0000 [IU] | SUBCUTANEOUS | Status: DC
  Administered 2018-11-07: 1 [IU] via SUBCUTANEOUS

## 2018-11-06 MED ORDER — POLYETHYLENE GLYCOL 3350 17 G PO PACK
17.0000 g | PACK | Freq: Every day | ORAL | Status: DC | PRN
  Administered 2018-11-12: 17 g via ORAL
  Filled 2018-11-06: qty 1

## 2018-11-06 MED ORDER — ACETAMINOPHEN 650 MG RE SUPP: 650.0000 mg | Freq: Four times a day (QID) | RECTAL | Status: DC | PRN

## 2018-11-06 MED ORDER — AMLODIPINE BESYLATE 5 MG PO TABS: 10.0000 mg | ORAL_TABLET | Freq: Every day | ORAL | Status: DC

## 2018-11-06 MED ORDER — TECHNETIUM TO 99M ALBUMIN AGGREGATED
1.5000 | Freq: Once | INTRAVENOUS | Status: AC | PRN
  Administered 2018-11-06: 1.5 via INTRAVENOUS

## 2018-11-06 MED ORDER — PANTOPRAZOLE SODIUM 40 MG PO TBEC
40.0000 mg | DELAYED_RELEASE_TABLET | Freq: Every day | ORAL | Status: DC
  Administered 2018-11-07 – 2018-11-19 (×12): 40 mg via ORAL
  Filled 2018-11-06 (×11): qty 1

## 2018-11-06 MED ORDER — SODIUM POLYSTYRENE SULFONATE 15 GM/60ML PO SUSP
15.0000 g | Freq: Once | ORAL | Status: AC
  Administered 2018-11-06: 15 g via ORAL
  Filled 2018-11-06: qty 60

## 2018-11-06 MED ORDER — SULFAMETHOXAZOLE-TRIMETHOPRIM 400-80 MG PO TABS
1.0000 | ORAL_TABLET | Freq: Every day | ORAL | Status: DC
  Filled 2018-11-06 (×2): qty 1

## 2018-11-06 MED ORDER — SODIUM BICARBONATE 650 MG PO TABS
650.0000 mg | ORAL_TABLET | Freq: Two times a day (BID) | ORAL | Status: DC
  Administered 2018-11-06 – 2018-11-09 (×7): 650 mg via ORAL
  Filled 2018-11-06 (×8): qty 1

## 2018-11-06 MED ORDER — CLOPIDOGREL BISULFATE 75 MG PO TABS
75.0000 mg | ORAL_TABLET | Freq: Every day | ORAL | Status: DC
  Administered 2018-11-07 – 2018-11-12 (×5): 75 mg via ORAL
  Filled 2018-11-06 (×6): qty 1

## 2018-11-06 MED ORDER — CALCIUM ACETATE (PHOS BINDER) 667 MG PO CAPS
667.0000 mg | ORAL_CAPSULE | Freq: Three times a day (TID) | ORAL | Status: DC
  Administered 2018-11-07 – 2018-11-30 (×56): 667 mg via ORAL
  Filled 2018-11-06 (×59): qty 1

## 2018-11-06 MED ORDER — FEBUXOSTAT 40 MG PO TABS
40.0000 mg | ORAL_TABLET | Freq: Every day | ORAL | Status: DC
  Administered 2018-11-07 – 2018-11-19 (×13): 40 mg via ORAL
  Filled 2018-11-06 (×14): qty 1

## 2018-11-06 NOTE — ED Notes (Signed)
Pt sleeping with equal rise and chest fall  

## 2018-11-06 NOTE — H&P (Addendum)
History and Physical    [name] PXT:[id number] DOB: [birth year] DOA: 11/06/2018  PCP: Doree Albee, MD   Patient coming from: Home  I have personally briefly reviewed patient's old medical records in [place]  Chief Complaint: Chest Pain  HPI: [name] is a 78 y.o. female with medical history significant for progressive renal insufficiency, gout, NSTEMI, DM 2, hypertension, hypothyroidism, who presented to the ED with complaints of Central chest pain that started this morning 4 AM in the morning while she was trying to sleep.  Chest pain is nonradiating and associated with difficulty breathing.  No associated nausea or vomiting.  Chest pain persisted so she came to the ED.  No known aggravating or relieving factors.  At the time of my evaluation chest pain has mostly resolved.  She has not exerted herself to see if she will have chest pain.  ED Course: Vitals stable.  Two-view chest x-ray, no overt edema, bibasilar atelectasis.  Troponin mildly elevated at 0.03 > 0.04.  Retinae elevated at 6.82, recent prior 3.8-4.53.  BUN of 100.  Potassium 5.4.  EKG nonspecific mild T wave changes 3 and aVF.  D-dimer elevated at 1.77, subsequent VQ scan showed normal perfusion of the lung.  Chronic anemia with hemoglobin of 9.  Hospitalist admit for chest pain rule out ACS.  Review of Systems: As per HPI all other systems reviewed and negative.  Past Medical History:  Diagnosis Date  . Anemia   . Arthritis   . Blood transfusion without reported diagnosis   . Cataract   . Chronic anemia   . Chronic combined systolic and diastolic CHF (congestive heart failure) ([place])    a. 2D echo 08/2016 at [facility]: EF 50-55% with inferior wall HK, impaired LV filling, fair study.  . CKD (chronic kidney disease), stage III ([place])   . Diabetes ([place])   . Gastritis   . GERD (gastroesophageal reflux disease)   . Gout   . Headache   . HTN (hypertension)   . Hyperlipidemia   . Hypothyroidism   .  Iron deficiency anemia 11/08/2016  . Normocytic anemia 10/26/2016  . NSTEMI (non-ST elevated myocardial infarction) ([place])    a. Complex admission 08/2016 - with severe hyperglycemia, AKI on CKD, severe anemia down to Hgb 6.8, acute combined CHF, troponin of 8.5, cath deferred due to renal dysfunction.  . Thyroid disease   . Wears dentures     Past Surgical History:  Procedure Laterality Date  . BASCILIC VEIN TRANSPOSITION Right 06/12/2018   Procedure: FIRST STAGE BASCILIC VEIN TRANSPOSITION RIGHT ARM;  Surgeon: Rosetta Posner, MD;  Location: [place];  Service: Vascular;  Laterality: Right;  . BASCILIC VEIN TRANSPOSITION Right 08/07/2018   Procedure: BASCILIC VEIN TRANSPOSITION SECOND STAGE RIGHT ARM;  Surgeon: Rosetta Posner, MD;  Location: [place];  Service: Vascular;  Laterality: Right;  . CATARACT EXTRACTION     left  . COLONOSCOPY WITH PROPOFOL N/A 10/08/2016   5 mm transverse colon polyp note resected due to plavix. hemorrhoids  . ENTEROSCOPY N/A 08/08/2017   Procedure: ENTEROSCOPY;  Surgeon: Daneil Dolin, MD;  [name];  Service: Endoscopy;  Laterality: N/A;  . ESOPHAGOGASTRODUODENOSCOPY N/A 10/06/2016   mild chroni gastritis, negative H.pylori  . ESOPHAGOGASTRODUODENOSCOPY (EGD) WITH PROPOFOL N/A 03/12/2017   mild edema/erythema of stomach, small bowel biopsy with focal villous tip lymphocytosis, ?partially developed celiac  . 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 GIVENS CAPSULE STUDY  10/08/2016   normal  . GIVENS CAPSULE STUDY N/A 10/29/2016   occasional gastric erosion, unremarkable small bowel  . ORIF HUMERUS FRACTURE Left 02/07/2015   Procedure: OPEN REDUCTION INTERNAL FIXATION (ORIF) PROXIMAL HUMERUS FRACTURE;  Surgeon: Marybelle Killings, MD;   Location: [place];  Service: Orthopedics;  Laterality: Left;  . teeth extractions    . THYROID SURGERY    . TOTAL HIP ARTHROPLASTY Left 02/07/2015   Procedure: TOTAL HIP ARTHROPLASTY ANTERIOR APPROACH ;  Surgeon: Marybelle Killings, MD;  Location: [place];  Service: Orthopedics;  Laterality: Left;  . WRIST SURGERY Left      reports that she has never smoked. She has never used smokeless tobacco. She reports that she does not drink alcohol or use drugs.  Allergies  Allergen Reactions  . Insulin Glargine Swelling    "Makes me swell like a balloon all over", including face, but without any respiratory distress or rashes. Associated with weight gain.  . Statins Other (See Comments)    "I've tried them all; my muscle aches were so bad I couldn't walk".    Family History  Problem Relation Age of Onset  . Diabetes Mother   . Asthma Mother   . Early death Mother 59       Pneumonia  . Early death Father        Killed at rodeo  . CAD Neg Hx   . GI Bleed Neg Hx     Prior to Admission medications   Medication Sig Start Date End Date Taking? Authorizing Provider  Alcohol Swabs (B-D SINGLE USE SWABS REGULAR) PADS  02/26/18  Yes [provider]  amLODipine (NORVASC) 10 MG tablet Take 1 tablet (10 mg total) by mouth daily. 04/02/18  Yes Branch, Alphonse Guild, MD  BD PEN NEEDLE MICRO U/F 32G X 6 MM MISC  08/04/18  Yes [provider]  calcium acetate (PHOSLO) 667 MG capsule Take 667 mg by mouth 3 (three) times daily with meals.   Yes [provider]  Cholecalciferol (VITAMIN D-3) 125 MCG (5000 UT) TABS Take 5,000 mg by mouth 3 (three) times a week.    Yes [provider]  clopidogrel (PLAVIX) 75 MG tablet Take 1 tablet (75 mg total) by mouth daily. 09/17/18  Yes Branch, Alphonse Guild, MD  docusate sodium (COLACE) 100 MG capsule Take 1 capsule (100 mg total) by mouth every 12 (twelve) hours. Patient taking differently: Take 100 mg by mouth daily.  06/24/17  Yes Long, Wonda Olds, MD   furosemide (LASIX) 20 MG tablet TAKE 1 TABLET BY MOUTH AS NEEDED ONLY  HAS  TO  TAKE  IT  WHEN  SHE  GAINS  2  POUNDS Patient taking differently: Take 60 mg by mouth daily.  07/18/18  Yes BranchAlphonse Guild, MD  gabapentin (NEURONTIN) 300 MG capsule Take 2 capsules (600 mg total) by mouth at bedtime. 11/18/17  Yes Mahala Menghini, PA-C  glucose blood test strip 1 each by Other route 4 (four) times daily. Use as instructed bid. E11.65 11/27/17  Yes Nida, Marella Chimes, MD  hydrALAZINE (APRESOLINE) 25 MG tablet Take 1.5 tablets (37.5 mg total) by mouth 2 (two) times daily. 08/29/18  Yes Branch, Alphonse Guild, MD  Insulin Glargine (TOUJEO SOLOSTAR [place]) Inject 5 Units into the skin at bedtime.  Yes [provider]  metoprolol tartrate (LOPRESSOR) 25 MG tablet Take 0.5 tablets (12.5 mg total) by mouth 2 (two) times daily. 12/23/17  Yes BranchAlphonse Guild, MD  Multiple Vitamin (MULTIVITAMIN WITH MINERALS) TABS tablet Take 1 tablet by mouth daily.   Yes [provider]  NP THYROID 120 MG tablet Take 120 mg by mouth daily. 08/28/18  Yes [provider]  pantoprazole (PROTONIX) 40 MG tablet Take 1 tablet (40 mg total) by mouth 2 (two) times daily. 30 minutes before breakfast Patient taking differently: Take 40 mg by mouth daily before breakfast.  11/27/17  Yes Annitta Needs, NP  sodium bicarbonate 650 MG tablet Take 650 mg by mouth 2 (two) times daily.   Yes [provider]  sulfamethoxazole-trimethoprim (BACTRIM DS) 800-160 MG tablet Take 1 tablet by mouth 2 (two) times a day. 10/30/18  Yes [provider]  ULORIC 40 MG tablet Take 40 mg by mouth daily.  09/11/17  Yes [provider]  vitamin C (ASCORBIC ACID) 500 MG tablet Take 500 mg by mouth daily.   Yes [provider]    Physical Exam: Vitals:   11/06/18 1730 11/06/18 1800 11/06/18 1830 11/06/18 1944  BP: (!) 137/46 (!) 134/45 (!) 142/51 (!) 145/50  Pulse: 69 67 71 73  Resp: 16 14 11 18   Temp:     98.4 F (36.9 C)  TempSrc:    Oral  SpO2: 94% 90% 94% 93%  Weight:      Height:        Constitutional: NAD, calm, comfortable Vitals:   11/06/18 1730 11/06/18 1800 11/06/18 1830 11/06/18 1944  BP: (!) 137/46 (!) 134/45 (!) 142/51 (!) 145/50  Pulse: 69 67 71 73  Resp: 16 14 11 18   Temp:    98.4 F (36.9 C)  TempSrc:    Oral  SpO2: 94% 90% 94% 93%  Weight:      Height:       Eyes: PERRL, lids and conjunctivae normal ENMT: Mucous membranes are moist. Posterior pharynx clear of any exudate or lesions  Neck: normal, supple, no masses, no thyromegaly Respiratory: Basilar crackles, no wheezing, . Normal respiratory effort. No accessory muscle use.  Cardiovascular: Regular rate and rhythm, no murmurs / rubs / gallops. No extremity edema. 2+ pedal pulses. No carotid bruits.  Abdomen: no tenderness, no masses palpated. No hepatosplenomegaly. Bowel sounds positive.  Musculoskeletal: no clubbing / cyanosis. No joint deformity upper and lower extremities. Good ROM, no contractures. Normal muscle tone.  New HD access with bruit to right arm Skin: ~3 by 3 cm healing dry wound with callus on sole of right lower extremity.  No drainage.  No surrounding redness.  No induration Neurologic: CN 2-12 grossly intact. Strength 5/5 in all 4.  Psychiatric: Normal judgment and insight. Alert and oriented x 3. Normal mood.   Labs on Admission: I have personally reviewed following labs and imaging studies  CBC: Recent Labs  Lab 11/06/18 0926  WBC 10.7*  NEUTROABS 9.0*  HGB 9.0*  HCT 29.0*  MCV 98.6  PLT 710   Basic Metabolic Panel: Recent Labs  Lab 11/06/18 0926  NA 139  K 5.4*  CL 104  CO2 17*  GLUCOSE 108*  BUN 100*  CREATININE 6.82*  CALCIUM 8.5*   Cardiac Enzymes: Recent Labs  Lab 11/06/18 0926 11/06/18 1253 11/06/18 1848  TROPONINI 0.03* 0.04* 0.05*   CBG: Recent Labs  Lab 11/06/18 1338  GLUCAP 114*   Thyroid Function Tests:  Recent Labs    11/06/18 0944  TSH 0.326*     Radiological Exams on Admission: Nm Pulmonary Perfusion  Result Date: 11/06/2018 CLINICAL DATA:  Chest pain and shortness of breath for 1 day, elevated D-dimer, chronic kidney disease with elevated creatinine EXAM: NUCLEAR MEDICINE PERFUSION LUNG SCAN TECHNIQUE: Perfusion images were obtained in multiple projections after intravenous injection of radiopharmaceutical. Ventilation scans intentionally deferred if perfusion scan and chest x-ray adequate for interpretation during COVID 19 epidemic. RADIOPHARMACEUTICALS:  1.5 mCi Tc-40m MAA IV COMPARISON:  09/27/2018 Correlation: Chest radiograph 11/06/2018 FINDINGS: Normal perfusion images of both lungs. No segmental or subsegmental perfusion defects. Ventilation exam not performed. IMPRESSION: Normal perfusion lung scan, unchanged. Electronically Signed   By: Lavonia Dana M.D.   On: 11/06/2018 16:27   Dg Chest Port 1 View  Result Date: 11/06/2018 CLINICAL DATA:  Chest pain EXAM: PORTABLE CHEST 1 VIEW COMPARISON:  09/26/2018 FINDINGS: Cardiomegaly. No overt edema. Bibasilar atelectasis noted. No visible significant effusions or acute bony abnormality. IMPRESSION: Cardiomegaly, bibasilar atelectasis.  No overt edema. Electronically Signed   By: Rolm Baptise M.D.   On: 11/06/2018 09:57    EKG: Independently reviewed.  Sinus rhythm.  Rate 79.  Mild Nonspecific T wave abnormalities 3 aVF.  QTc 449.  Assessment/Plan Active Problems:   Chest pain    Chest pain- Symptoms not exactly typical for ACS, troponin 0.03 > 0.04 > 0.05.  EKG without significant abnormalities.  VQ scan with normal lung perfusion.  With history of NSTEMI on Prior admissions.  Elevated troponin in the setting of severe anemia.  Patient has been undergoing medical treatment dual antiplatelets due to advanced renal dysfunction and anemia.  Peak troponin 8.5.  Follows with Dr. Velva Harman chronic obstructive coronary artery disease with combined demand ischemia as opposed to acute  occlusive CAD.  Per notes- renal failure and anemia has prohibited ischemic testing.  -Trend troponin -EKG a.m. -Echocardiogram -May benefit from curbside consult with cardiology. -N.p.o. midnight -Continue medical therapy with Plavix, aspirin, metoprolol -Allergy to statins noted  Chronic anemia-hemoglobin stable at 9.  Likely secondary to chronic renal disease.  Follows with Dr. Delton Coombes.  Monthly injections of  retacrit.  CKD 5-creatinine 6.82 today, BUN 100, K- 5.4. Recent Cr 8 days ago 3.89.  Follows with Dr. Lowanda Foster.  Patient has HD access established and right arm.  No Sign of volume overload.  On Lasix 60 mg daily. - Hold Lasix for now - Kayexalate X 1 - BMP a.m. - Will need to follow-up with nephrology may need to resume HD sooner rather than later  Hypertension- Stable. -Continue metoprolol, hydralazine,  DM2-random glucose 100. -Hold home Toujeo - SSI  Right foot wound-she has a healing circular wound that is dry, for which was prescribed Bactrim to complete 2 more doses.  DVT prophylaxis: Hepain Code Status: Full Family Communication: None at bedside Disposition Plan: 1- 2 days Consults called: None Admission status: Obs, tele  Bethena Roys MD [facility]  11/06/2018, 9:02 PM

## 2018-11-06 NOTE — ED Notes (Signed)
CRITICAL VALUE ALERT  Critical Value:  Troponin 0.03   Date & Time Notied:  11/06/2018 0954  Provider Notified: Dr. Lacinda Axon   Orders Received/Actions taken: None yet

## 2018-11-06 NOTE — ED Notes (Signed)
Pt resting with equal rise and chest fall

## 2018-11-06 NOTE — ED Provider Notes (Addendum)
[facility] EMERGENCY DEPARTMENT Provider Note   CSN: [id number] Arrival date & time: 11/06/18  0820    History   Chief Complaint Chief Complaint  Patient presents with  . Chest Pain    HPI [name] is a 78 y.o. female.     Central chest pain upon awakening this morning with slight dyspnea.  No diaphoresis or nausea.  She apparently was admitted to the hospital approximately 1 month ago for dyspnea, but I cannot find her chart in epic.  No previous cardiac disease.  Past medical history includes chronic renal failure stage V, hypertension, diabetes CHF, anemia, hypothyroidism.  She is taking nothing for the pain.  Severity is moderate.  She was able to walk from the car into the emergency department.     Past Medical History:  Diagnosis Date  . Anemia   . Arthritis   . Blood transfusion without reported diagnosis   . Cataract   . Chronic anemia   . Chronic combined systolic and diastolic CHF (congestive heart failure) ([place])    a. 2D echo 08/2016 at [facility]: EF 50-55% with inferior wall HK, impaired LV filling, fair study.  . CKD (chronic kidney disease), stage III ([place])   . Diabetes ([place])   . Gastritis   . GERD (gastroesophageal reflux disease)   . Gout   . Headache   . HTN (hypertension)   . Hyperlipidemia   . Hypothyroidism   . Iron deficiency anemia 11/08/2016  . Normocytic anemia 10/26/2016  . NSTEMI (non-ST elevated myocardial infarction) ([place])    a. Complex admission 08/2016 - with severe hyperglycemia, AKI on CKD, severe anemia down to Hgb 6.8, acute combined CHF, troponin of 8.5, cath deferred due to renal dysfunction.  . Thyroid disease   . Wears dentures     Patient Active Problem List   Diagnosis Date Noted  . Acute hypoxemic respiratory failure ([place]) 10/17/2017  . Dyspnea 10/17/2017  . Polyp of transverse colon 10/08/2017  . Anemia 08/07/2017  . GI bleed 07/30/2017  . Hyperkalemia 07/30/2017  . Essential hypertension, benign 12/13/2016  .  Hypothyroidism 12/13/2016  . Iron deficiency anemia 11/08/2016  . Normocytic anemia 10/26/2016  . Elevated troponin   . Aortic atherosclerosis ([place]) 10/25/2016  . Symptomatic anemia 10/04/2016  . History of non-ST elevation myocardial infarction (NSTEMI) 10/04/2016  . Mixed hyperlipidemia 10/04/2016  . Neuropathy 09/20/2016  . Chronic kidney disease, stage IV (severe) ([place]) 08/03/2015  . Hip fracture ([place]) 02/05/2015  . Diabetes mellitus with stage 4 chronic kidney disease ([place]) 02/05/2015  . HTN (hypertension) 02/05/2015  . Gout 02/05/2015  . Left humeral fracture 02/05/2015  . Hyperlipidemia associated with type 2 diabetes mellitus ([place]) 05/01/2013    Past Surgical History:  Procedure Laterality Date  . BASCILIC VEIN TRANSPOSITION Right 06/12/2018   Procedure: FIRST STAGE BASCILIC VEIN TRANSPOSITION RIGHT ARM;  Surgeon: Rosetta Posner, MD;  Location: [place];  Service: Vascular;  Laterality: Right;  . BASCILIC VEIN TRANSPOSITION Right 08/07/2018   Procedure: BASCILIC VEIN TRANSPOSITION SECOND STAGE RIGHT ARM;  Surgeon: Rosetta Posner, MD;  Location: [place];  Service: Vascular;  Laterality: Right;  . CATARACT EXTRACTION     left  . COLONOSCOPY WITH PROPOFOL N/A 10/08/2016   5 mm transverse colon polyp note resected due to plavix. hemorrhoids  . ENTEROSCOPY N/A 08/08/2017   Procedure: ENTEROSCOPY;  Surgeon: Daneil Dolin, MD;  [name];  Service: Endoscopy;  Laterality: N/A;  . ESOPHAGOGASTRODUODENOSCOPY N/A 10/06/2016  mild chroni gastritis, negative H.pylori  . ESOPHAGOGASTRODUODENOSCOPY (EGD) WITH PROPOFOL N/A 03/12/2017   mild edema/erythema of stomach, small bowel biopsy with focal villous tip lymphocytosis, ?partially developed celiac  . 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 GIVENS CAPSULE STUDY  10/08/2016   normal  . GIVENS CAPSULE STUDY N/A 10/29/2016   occasional gastric erosion, unremarkable small bowel  . ORIF HUMERUS FRACTURE Left 02/07/2015   Procedure: OPEN REDUCTION INTERNAL FIXATION (ORIF) PROXIMAL HUMERUS FRACTURE;  Surgeon: Marybelle Killings, MD;  Location: [place];  Service: Orthopedics;  Laterality: Left;  . teeth extractions    . THYROID SURGERY    . TOTAL HIP ARTHROPLASTY Left 02/07/2015   Procedure: TOTAL HIP ARTHROPLASTY ANTERIOR APPROACH ;  Surgeon: Marybelle Killings, MD;  Location: [place];  Service: Orthopedics;  Laterality: Left;  . WRIST SURGERY Left      OB History   No obstetric history on file.      Home Medications    Prior to Admission medications   Medication Sig Start Date End Date Taking? Authorizing Provider  Alcohol Swabs (B-D SINGLE USE SWABS REGULAR) PADS  02/26/18  Yes [provider]  amLODipine (NORVASC) 10 MG tablet Take 1 tablet (10 mg total) by mouth daily. 04/02/18  Yes Branch, Alphonse Guild, MD  BD PEN NEEDLE MICRO U/F 32G X 6 MM MISC  08/04/18  Yes [provider]  calcium acetate (PHOSLO) 667 MG capsule Take 667 mg by mouth 3 (three) times daily with meals.   Yes [provider]  Cholecalciferol (VITAMIN D-3) 125 MCG (5000 UT) TABS Take 5,000 mg by mouth 3 (three) times a week.    Yes [provider]  clopidogrel (PLAVIX) 75 MG tablet Take 1 tablet (75 mg total) by mouth daily. 09/17/18  Yes Branch, Alphonse Guild, MD  docusate sodium (COLACE) 100 MG capsule Take 1 capsule (100 mg total) by mouth every 12 (twelve) hours. Patient taking differently: Take 100 mg by mouth daily.  06/24/17  Yes Long, Wonda Olds, MD  furosemide (LASIX) 20 MG tablet TAKE 1 TABLET BY MOUTH AS NEEDED ONLY  HAS  TO  TAKE  IT  WHEN  SHE  GAINS  2  POUNDS Patient taking differently: Take 60 mg by mouth daily.  07/18/18  Yes BranchAlphonse Guild, MD  gabapentin (NEURONTIN) 300 MG capsule Take 2  capsules (600 mg total) by mouth at bedtime. 11/18/17  Yes Mahala Menghini, PA-C  glucose blood test strip 1 each by Other route 4 (four) times daily. Use as instructed bid. E11.65 11/27/17  Yes Nida, Marella Chimes, MD  hydrALAZINE (APRESOLINE) 25 MG tablet Take 1.5 tablets (37.5 mg total) by mouth 2 (two) times daily. 08/29/18  Yes Branch, Alphonse Guild, MD  Insulin Glargine (TOUJEO SOLOSTAR [place]) Inject 5 Units into the skin at bedtime.   Yes [provider]  metoprolol tartrate (LOPRESSOR) 25 MG tablet Take 0.5 tablets (12.5 mg total) by mouth 2 (two) times daily. 12/23/17  Yes BranchAlphonse Guild, MD  Multiple Vitamin (MULTIVITAMIN WITH MINERALS) TABS tablet Take 1 tablet by mouth daily.   Yes [provider]  NP THYROID 120 MG tablet Take 120 mg by mouth daily. 08/28/18  Yes [provider]  pantoprazole (PROTONIX) 40 MG tablet Take 1 tablet (40 mg total)  by mouth 2 (two) times daily. 30 minutes before breakfast Patient taking differently: Take 40 mg by mouth daily before breakfast.  11/27/17  Yes Annitta Needs, NP  sodium bicarbonate 650 MG tablet Take 650 mg by mouth 2 (two) times daily.   Yes [provider]  sulfamethoxazole-trimethoprim (BACTRIM DS) 800-160 MG tablet Take 1 tablet by mouth 2 (two) times a day. 10/30/18  Yes [provider]  ULORIC 40 MG tablet Take 40 mg by mouth daily.  09/11/17  Yes [provider]  vitamin C (ASCORBIC ACID) 500 MG tablet Take 500 mg by mouth daily.   Yes [provider]    Family History Family History  Problem Relation Age of Onset  . Diabetes Mother   . Asthma Mother   . Early death Mother 83       Pneumonia  . Early death Father        Killed at [place]  . CAD Neg Hx   . GI Bleed Neg Hx     Social History Social History   Tobacco Use  . Smoking status: Never Smoker  . Smokeless tobacco: Never Used  Substance Use Topics  . Alcohol use: No    Alcohol/week: 0.0 standard drinks  . Drug use:  No     Allergies   Insulin glargine and Statins   Review of Systems Review of Systems  All other systems reviewed and are negative.    Physical Exam Updated Vital Signs BP (!) 127/46   Pulse 69   Resp 14   Ht 5\' 4"  (1.626 m)   Wt 78.9 kg   SpO2 93%   BMI 29.87 kg/m   Physical Exam Vitals signs and nursing note reviewed.  Constitutional:      Appearance: She is well-developed.  HENT:     Head: Normocephalic and atraumatic.  Eyes:     Conjunctiva/sclera: Conjunctivae normal.  Neck:     Musculoskeletal: Neck supple.  Cardiovascular:     Rate and Rhythm: Normal rate and regular rhythm.  Pulmonary:     Effort: Pulmonary effort is normal.     Breath sounds: Normal breath sounds.  Abdominal:     General: Bowel sounds are normal.     Palpations: Abdomen is soft.  Musculoskeletal: Normal range of motion.  Skin:    General: Skin is warm and dry.  Neurological:     Mental Status: She is alert and oriented to person, place, and time.  Psychiatric:        Behavior: Behavior normal.      ED Treatments / Results  Labs (all labs ordered are listed, but only abnormal results are displayed) Labs Reviewed  CBC WITH DIFFERENTIAL/PLATELET - Abnormal; Notable for the following components:      Result Value   WBC 10.7 (*)    RBC 2.94 (*)    Hemoglobin 9.0 (*)    HCT 29.0 (*)    Neutro Abs 9.0 (*)    All other components within normal limits  BASIC METABOLIC PANEL - Abnormal; Notable for the following components:   Potassium 5.4 (*)    CO2 17 (*)    Glucose, Bld 108 (*)    BUN 100 (*)    Creatinine, Ser 6.82 (*)    Calcium 8.5 (*)    GFR calc non Af Amer 5 (*)    GFR calc Af Amer 6 (*)    Anion gap 18 (*)    All other components within normal limits  TROPONIN I - Abnormal; Notable for the following components:   Troponin I 0.03 (*)    All other components within normal limits  D-DIMER, QUANTITATIVE (NOT AT [facility]) - Abnormal; Notable for the following components:    D-Dimer, Quant 1.77 (*)    All other components within normal limits  TSH - Abnormal; Notable for the following components:   TSH 0.326 (*)    All other components within normal limits  TROPONIN I - Abnormal; Notable for the following components:   Troponin I 0.04 (*)    All other components within normal limits  CBG MONITORING, ED - Abnormal; Notable for the following components:   Glucose-Capillary 114 (*)    All other components within normal limits  SARS CORONAVIRUS 2 (HOSPITAL ORDER, [place] [facility])    EKG EKG Interpretation  Date/Time:  Thursday November 06 2018 08:37:16 EDT Ventricular Rate:  79 PR Interval:    QRS Duration: 100 QT Interval:  391 QTC Calculation: 449 R Axis:   10 Text Interpretation:  Sinus rhythm Minimal ST depression, lateral leads Confirmed by Nat Christen [phone]) on 11/06/2018 9:49:00 AM   Date: 11/06/2018  Rate: 79  Rhythm: normal sinus rhythm  QRS Axis: normal  Intervals: normal  ST/T Wave abnormalities: normal  Conduction Disutrbances: none  Narrative Interpretation: unremarkable    Radiology Dg Chest Port 1 View  Result Date: 11/06/2018 CLINICAL DATA:  Chest pain EXAM: PORTABLE CHEST 1 VIEW COMPARISON:  09/26/2018 FINDINGS: Cardiomegaly. No overt edema. Bibasilar atelectasis noted. No visible significant effusions or acute bony abnormality. IMPRESSION: Cardiomegaly, bibasilar atelectasis.  No overt edema. Electronically Signed   By: Rolm Baptise M.D.   On: 11/06/2018 09:57    Procedures Procedures (including critical care time)  Medications Ordered in ED Medications - No data to display   Initial Impression / Assessment and Plan / ED Course  I have reviewed the triage vital signs and the nursing notes.  Pertinent labs & imaging results that were available during my care of the patient were reviewed by me and considered in my medical decision making (see chart for details).        Chief complaint chest pain.   Will initiate typical work-up.  Patient is hemodynamically stable.  1200: Patient is hemodynamically stable.  No respiratory distress.  First troponin 0 0.03.  D-dimer 1.77.  Creatinine 6.82 EKG and chest x-ray negative.  Will obtain second troponin and ventilation perfusion study secondary to elevated creatinine   1500: Patient is hemodynamically stable.  Discussed with Dr. Roderic Palau.  CRITICAL CARE Performed by: Nat Christen Total critical care time: 30 minutes Critical care time was exclusive of separately billable procedures and treating other patients. Critical care was necessary to treat or prevent imminent or life-threatening deterioration. Critical care was time spent personally by me on the following activities: development of treatment plan with patient and/or surrogate as well as nursing, discussions with consultants, evaluation of patient's response to treatment, examination of patient, obtaining history from patient or surrogate, ordering and performing treatments and interventions, ordering and review of laboratory studies, ordering and review of radiographic studies, pulse oximetry and re-evaluation of patient's condition. Final Clinical Impressions(s) / ED Diagnoses   Final diagnoses:  Chest pain, unspecified type  Elevated troponin  CKD (chronic kidney disease) stage 5, GFR less than 15 ml/min [facility])    ED Discharge Orders    None       Nat Christen, MD 11/06/18 [id number]    Nat Christen,  MD 11/06/18 [id number]    Nat Christen, MD 11/06/18 1308    Nat Christen, MD 11/06/18 [phone]

## 2018-11-06 NOTE — ED Notes (Signed)
CRITICAL VALUE ALERT  Critical Value:  Troponin 0.04  Date & Time Notied:  11/06/2018 1346  Provider Notified: Dr. Lacinda Axon   Orders Received/Actions taken: None yet

## 2018-11-06 NOTE — ED Notes (Signed)
Pt to xray for VQ Scan

## 2018-11-06 NOTE — ED Triage Notes (Signed)
Pt reports started having pain in center of chest approx 6 hours ago.  Reports pain woke her up.  Denies any  cough  or fever.  Pt says has "some" sob.  Reports was tested for covid approx 1 month ago when she was admitted for SOB.

## 2018-11-06 NOTE — ED Notes (Signed)
Have given pt a meal tray  

## 2018-11-06 NOTE — ED Notes (Signed)
Have placed pt on 1L [place]. O2 sats 95%

## 2018-11-07 ENCOUNTER — Encounter (HOSPITAL_COMMUNITY): Payer: Self-pay

## 2018-11-07 ENCOUNTER — Observation Stay (HOSPITAL_COMMUNITY): Payer: Medicare HMO

## 2018-11-07 DIAGNOSIS — N185 Chronic kidney disease, stage 5: Secondary | ICD-10-CM

## 2018-11-07 DIAGNOSIS — I2510 Atherosclerotic heart disease of native coronary artery without angina pectoris: Secondary | ICD-10-CM | POA: Diagnosis not present

## 2018-11-07 DIAGNOSIS — E039 Hypothyroidism, unspecified: Secondary | ICD-10-CM

## 2018-11-07 DIAGNOSIS — D649 Anemia, unspecified: Secondary | ICD-10-CM

## 2018-11-07 DIAGNOSIS — N19 Unspecified kidney failure: Secondary | ICD-10-CM

## 2018-11-07 DIAGNOSIS — E1121 Type 2 diabetes mellitus with diabetic nephropathy: Secondary | ICD-10-CM

## 2018-11-07 DIAGNOSIS — I214 Non-ST elevation (NSTEMI) myocardial infarction: Principal | ICD-10-CM

## 2018-11-07 DIAGNOSIS — I272 Pulmonary hypertension, unspecified: Secondary | ICD-10-CM | POA: Diagnosis present

## 2018-11-07 DIAGNOSIS — L97509 Non-pressure chronic ulcer of other part of unspecified foot with unspecified severity: Secondary | ICD-10-CM | POA: Diagnosis present

## 2018-11-07 DIAGNOSIS — J90 Pleural effusion, not elsewhere classified: Secondary | ICD-10-CM | POA: Diagnosis not present

## 2018-11-07 DIAGNOSIS — R0789 Other chest pain: Secondary | ICD-10-CM | POA: Diagnosis present

## 2018-11-07 DIAGNOSIS — E1142 Type 2 diabetes mellitus with diabetic polyneuropathy: Secondary | ICD-10-CM | POA: Diagnosis present

## 2018-11-07 DIAGNOSIS — I132 Hypertensive heart and chronic kidney disease with heart failure and with stage 5 chronic kidney disease, or end stage renal disease: Secondary | ICD-10-CM | POA: Diagnosis present

## 2018-11-07 DIAGNOSIS — K922 Gastrointestinal hemorrhage, unspecified: Secondary | ICD-10-CM

## 2018-11-07 DIAGNOSIS — Z951 Presence of aortocoronary bypass graft: Secondary | ICD-10-CM | POA: Diagnosis not present

## 2018-11-07 DIAGNOSIS — I200 Unstable angina: Secondary | ICD-10-CM | POA: Diagnosis not present

## 2018-11-07 DIAGNOSIS — I25110 Atherosclerotic heart disease of native coronary artery with unstable angina pectoris: Secondary | ICD-10-CM | POA: Diagnosis present

## 2018-11-07 DIAGNOSIS — E1122 Type 2 diabetes mellitus with diabetic chronic kidney disease: Secondary | ICD-10-CM | POA: Diagnosis present

## 2018-11-07 DIAGNOSIS — I5031 Acute diastolic (congestive) heart failure: Secondary | ICD-10-CM | POA: Diagnosis not present

## 2018-11-07 DIAGNOSIS — I340 Nonrheumatic mitral (valve) insufficiency: Secondary | ICD-10-CM | POA: Diagnosis not present

## 2018-11-07 DIAGNOSIS — I2584 Coronary atherosclerosis due to calcified coronary lesion: Secondary | ICD-10-CM | POA: Diagnosis present

## 2018-11-07 DIAGNOSIS — N179 Acute kidney failure, unspecified: Secondary | ICD-10-CM

## 2018-11-07 DIAGNOSIS — I517 Cardiomegaly: Secondary | ICD-10-CM | POA: Diagnosis not present

## 2018-11-07 DIAGNOSIS — J9811 Atelectasis: Secondary | ICD-10-CM | POA: Diagnosis not present

## 2018-11-07 DIAGNOSIS — R7989 Other specified abnormal findings of blood chemistry: Secondary | ICD-10-CM | POA: Diagnosis not present

## 2018-11-07 DIAGNOSIS — I480 Paroxysmal atrial fibrillation: Secondary | ICD-10-CM | POA: Diagnosis not present

## 2018-11-07 DIAGNOSIS — Z1159 Encounter for screening for other viral diseases: Secondary | ICD-10-CM | POA: Diagnosis not present

## 2018-11-07 DIAGNOSIS — I10 Essential (primary) hypertension: Secondary | ICD-10-CM | POA: Diagnosis not present

## 2018-11-07 DIAGNOSIS — I361 Nonrheumatic tricuspid (valve) insufficiency: Secondary | ICD-10-CM | POA: Diagnosis not present

## 2018-11-07 DIAGNOSIS — Z01810 Encounter for preprocedural cardiovascular examination: Secondary | ICD-10-CM | POA: Diagnosis not present

## 2018-11-07 DIAGNOSIS — R079 Chest pain, unspecified: Secondary | ICD-10-CM | POA: Diagnosis not present

## 2018-11-07 DIAGNOSIS — N2581 Secondary hyperparathyroidism of renal origin: Secondary | ICD-10-CM | POA: Diagnosis present

## 2018-11-07 DIAGNOSIS — N186 End stage renal disease: Secondary | ICD-10-CM | POA: Diagnosis present

## 2018-11-07 DIAGNOSIS — D62 Acute posthemorrhagic anemia: Secondary | ICD-10-CM | POA: Diagnosis not present

## 2018-11-07 DIAGNOSIS — J189 Pneumonia, unspecified organism: Secondary | ICD-10-CM | POA: Diagnosis not present

## 2018-11-07 DIAGNOSIS — I5043 Acute on chronic combined systolic (congestive) and diastolic (congestive) heart failure: Secondary | ICD-10-CM | POA: Diagnosis present

## 2018-11-07 DIAGNOSIS — E11621 Type 2 diabetes mellitus with foot ulcer: Secondary | ICD-10-CM | POA: Diagnosis present

## 2018-11-07 DIAGNOSIS — J9601 Acute respiratory failure with hypoxia: Secondary | ICD-10-CM | POA: Diagnosis not present

## 2018-11-07 DIAGNOSIS — I5033 Acute on chronic diastolic (congestive) heart failure: Secondary | ICD-10-CM | POA: Diagnosis not present

## 2018-11-07 DIAGNOSIS — E1169 Type 2 diabetes mellitus with other specified complication: Secondary | ICD-10-CM | POA: Diagnosis present

## 2018-11-07 DIAGNOSIS — E782 Mixed hyperlipidemia: Secondary | ICD-10-CM | POA: Diagnosis present

## 2018-11-07 DIAGNOSIS — J811 Chronic pulmonary edema: Secondary | ICD-10-CM | POA: Diagnosis not present

## 2018-11-07 DIAGNOSIS — E872 Acidosis: Secondary | ICD-10-CM | POA: Diagnosis present

## 2018-11-07 DIAGNOSIS — D631 Anemia in chronic kidney disease: Secondary | ICD-10-CM | POA: Diagnosis present

## 2018-11-07 LAB — GLUCOSE, CAPILLARY
Glucose-Capillary: 107 mg/dL — ABNORMAL HIGH (ref 70–99)
Glucose-Capillary: 111 mg/dL — ABNORMAL HIGH (ref 70–99)
Glucose-Capillary: 116 mg/dL — ABNORMAL HIGH (ref 70–99)
Glucose-Capillary: 118 mg/dL — ABNORMAL HIGH (ref 70–99)
Glucose-Capillary: 145 mg/dL — ABNORMAL HIGH (ref 70–99)
Glucose-Capillary: 93 mg/dL (ref 70–99)
Glucose-Capillary: 94 mg/dL (ref 70–99)

## 2018-11-07 LAB — TROPONIN I
Troponin I: 0.05 ng/mL (ref <0.03)
Troponin I: 0.05 ng/mL (ref <0.03)
Troponin I: 4.14 ng/mL (ref <0.03)

## 2018-11-07 LAB — BASIC METABOLIC PANEL
Anion gap: 14 (ref 5–15)
BUN: 103 mg/dL — ABNORMAL HIGH (ref 8–23)
CO2: 18 mmol/L — ABNORMAL LOW (ref 22–32)
Calcium: 8.1 mg/dL — ABNORMAL LOW (ref 8.9–10.3)
Chloride: 107 mmol/L (ref 98–111)
Creatinine, Ser: 7.1 mg/dL — ABNORMAL HIGH (ref 0.44–1.00)
GFR calc Af Amer: 6 mL/min — ABNORMAL LOW (ref >60)
GFR calc non Af Amer: 5 mL/min — ABNORMAL LOW (ref >60)
Glucose, Bld: 112 mg/dL — ABNORMAL HIGH (ref 70–99)
Potassium: 5.1 mmol/L (ref 3.5–5.1)
Sodium: 139 mmol/L (ref 135–145)

## 2018-11-07 LAB — LACTIC ACID, PLASMA
Lactic Acid, Venous: 0.7 mmol/L (ref 0.5–1.9)
Lactic Acid, Venous: 0.9 mmol/L (ref 0.5–1.9)

## 2018-11-07 LAB — MRSA PCR SCREENING: MRSA by PCR: NEGATIVE

## 2018-11-07 MED ORDER — VANCOMYCIN HCL IN DEXTROSE 750-5 MG/150ML-% IV SOLN: 750.0000 mg | Freq: Once | INTRAVENOUS | Status: DC

## 2018-11-07 MED ORDER — LIDOCAINE HCL (PF) 1 % IJ SOLN
5.0000 mL | INTRAMUSCULAR | Status: DC | PRN
  Administered 2018-11-07: 18:00:00 0.2 mL via INTRADERMAL

## 2018-11-07 MED ORDER — VANCOMYCIN HCL 1.5 G IV SOLR
1500.0000 mg | Freq: Once | INTRAVENOUS | Status: AC
  Administered 2018-11-07: 1500 mg via INTRAVENOUS
  Filled 2018-11-07: qty 1500

## 2018-11-07 MED ORDER — MORPHINE SULFATE (PF) 4 MG/ML IV SOLN
4.0000 mg | Freq: Once | INTRAVENOUS | Status: AC
  Administered 2018-11-07: 4 mg via INTRAVENOUS
  Filled 2018-11-07: qty 1

## 2018-11-07 MED ORDER — SODIUM CHLORIDE 0.9 % IV SOLN: 100.0000 mL | INTRAVENOUS | Status: DC | PRN

## 2018-11-07 MED ORDER — SODIUM CHLORIDE 0.9 % IV SOLN: 1.0000 g | INTRAVENOUS | Status: DC

## 2018-11-07 MED ORDER — LIDOCAINE-PRILOCAINE 2.5-2.5 % EX CREA
1.0000 "application " | TOPICAL_CREAM | CUTANEOUS | Status: DC | PRN
  Filled 2018-11-07: qty 5

## 2018-11-07 MED ORDER — GABAPENTIN 300 MG PO CAPS
300.0000 mg | ORAL_CAPSULE | Freq: Once | ORAL | Status: AC
  Administered 2018-11-07: 300 mg via ORAL
  Filled 2018-11-07: qty 1

## 2018-11-07 MED ORDER — VANCOMYCIN HCL IN DEXTROSE 1-5 GM/200ML-% IV SOLN: 1000.0000 mg | Freq: Once | INTRAVENOUS | Status: DC

## 2018-11-07 MED ORDER — SODIUM CHLORIDE 0.9 % IV SOLN
1.0000 g | INTRAVENOUS | Status: DC
  Administered 2018-11-07: 1 g via INTRAVENOUS
  Filled 2018-11-07: qty 1

## 2018-11-07 MED ORDER — LIDOCAINE HCL (PF) 1 % IJ SOLN
INTRAMUSCULAR | Status: AC
  Administered 2018-11-07: 0.2 mL via INTRADERMAL
  Filled 2018-11-07: qty 5

## 2018-11-07 MED ORDER — PENTAFLUOROPROP-TETRAFLUOROETH EX AERO: 1.0000 "application " | INHALATION_SPRAY | CUTANEOUS | Status: DC | PRN

## 2018-11-07 MED ORDER — CHLORHEXIDINE GLUCONATE CLOTH 2 % EX PADS
6.0000 | MEDICATED_PAD | Freq: Every day | CUTANEOUS | Status: DC
  Administered 2018-11-07 – 2018-11-11 (×5): 6 via TOPICAL

## 2018-11-07 MED ORDER — HEPARIN SODIUM (PORCINE) 1000 UNIT/ML DIALYSIS
20.0000 [IU]/kg | INTRAMUSCULAR | Status: DC | PRN
  Filled 2018-11-07 (×2): qty 2

## 2018-11-07 NOTE — Consult Note (Addendum)
Cardiology Consultation:   Patient ID: [name] [name]; [id number]; 17-Jul-1940   Admit date: 11/06/2018 Date of Consult: 11/07/2018  Primary Care Provider: Doree Albee, MD Primary Cardiologist: Carlyle Dolly, MD 08/29/2018 virtual visit Primary Electrophysiologist:  None   Patient Profile:   [name] [name] is a 78 y.o. female with a hx of NSTEMI rx medically due to renal dz and anemia, on DAPT>>stopped 2nd anemia, intol statins, no ACE/ARB 2nd poor renal function, EF 50-55% on echo 2018, GIB req transfusions, HTN, CKD nearing HD, hypothyroid, gout, who is being seen today for the evaluation of NSTEMI at the request of Dr Dyann Kief.  History of Present Illness:   [name] [name] was admitted 06/11 with CP, trop 4.14>>cards asked to see.   [name] [name] does not normally get chest pain.  She is able to do housework and hang clothes and do laundry and dishes.  About a month ago, she had pneumonia.  She does not feel like her breathing is all the way back to normal from that but she is improving.  She does not wake with lower extremity edema, denies orthopnea and PND.  This is the first episode of chest pain she has had in a long time.  She has a history of GI bleed, but believes that that she had a bleeding vessel that was clipped by her GI doctor.  She is not aware that she is currently losing blood.  Her chest pain has responded to nitroglycerin and has resolved.   Past Medical History:  Diagnosis Date   Anemia    Arthritis    Blood transfusion without reported diagnosis    Cataract    Chronic anemia    Chronic combined systolic and diastolic CHF (congestive heart failure) ([place])    a. 2D echo 08/2016 at [facility]: EF 50-55% with inferior wall HK, impaired LV filling, fair study.   CKD (chronic kidney disease), stage III (HCC)    Diabetes (HCC)    Gastritis    GERD (gastroesophageal reflux disease)    Gout    Headache    HTN (hypertension)    Hyperlipidemia     Hypothyroidism    Iron deficiency anemia 11/08/2016   Normocytic anemia 10/26/2016   NSTEMI (non-ST elevated myocardial infarction) ([place])    a. Complex admission 08/2016 - with severe hyperglycemia, AKI on CKD, severe anemia down to Hgb 6.8, acute combined CHF, troponin of 8.5, cath deferred due to renal dysfunction.   Thyroid disease    Wears dentures     Past Surgical History:  Procedure Laterality Date   BASCILIC VEIN TRANSPOSITION Right 06/12/2018   Procedure: FIRST STAGE BASCILIC VEIN TRANSPOSITION RIGHT ARM;  Surgeon: Rosetta Posner, MD;  Location: [facility] OR;  Service: Vascular;  Laterality: Right;   [place] Right 08/07/2018   Procedure: [place]y STAGE RIGHT ARM;  Surgeon: Rosetta Posner, MD;  Location: [facility] OR;  Service: Vascular;  Laterality: Right;   CATARACT EXTRACTION     left   COLONOSCOPY WITH PROPOFOL N/A 10/08/2016   5 mm transverse colon polyp note resected due to plavix. hemorrhoids   ENTEROSCOPY N/A 08/08/2017   Procedure: ENTEROSCOPY;  Surgeon: Daneil Dolin, MD;  [facility]: [name];  Service: Endoscopy;  Laterality: N/A;   ESOPHAGOGASTRODUODENOSCOPY N/A 10/06/2016   mild chroni gastritis, negative H.pylori   ESOPHAGOGASTRODUODENOSCOPY (EGD) WITH PROPOFOL N/A 03/12/2017   mild edema/erythema of stomach, small bowel biopsy with focal villous tip lymphocytosis, ?partially developed celiac  ESOPHAGOGASTRODUODENOSCOPY (EGD) WITH PROPOFOL N/A 08/08/2017   normal esophagus, small hiatal hernia, normal duodenal bulb, abnormal small bowel junction of duodenum and jejunum lwith active bleeding 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, s/p clips and lesion tattooed   GIVENS CAPSULE STUDY  10/08/2016   normal   GIVENS CAPSULE STUDY N/A 10/29/2016   occasional gastric erosion, unremarkable small bowel   ORIF HUMERUS FRACTURE Left 02/07/2015   Procedure: OPEN REDUCTION INTERNAL FIXATION (ORIF) PROXIMAL HUMERUS FRACTURE;  Surgeon: Marybelle Killings, MD;  Location: [facility];  Service: Orthopedics;  Laterality: Left;   teeth extractions     THYROID SURGERY     TOTAL HIP ARTHROPLASTY Left 02/07/2015   Procedure: TOTAL HIP ARTHROPLASTY ANTERIOR APPROACH ;  Surgeon: Marybelle Killings, MD;  Location: [facility];  Service: Orthopedics;  Laterality: Left;   WRIST SURGERY Left      Prior to Admission medications   Medication Sig Start Date End Date Taking? Authorizing Provider  Alcohol Swabs (B-D SINGLE USE SWABS REGULAR) PADS  02/26/18  Yes [provider]  amLODipine (NORVASC) 10 MG tablet Take 1 tablet (10 mg total) by mouth daily. 04/02/18  Yes Branch, Alphonse Guild, MD  BD PEN NEEDLE MICRO U/F 32G X 6 MM MISC  08/04/18  Yes [provider]  calcium acetate (PHOSLO) 667 MG capsule Take 667 mg by mouth 3 (three) times daily with meals.   Yes [provider]  Cholecalciferol (VITAMIN D-3) 125 MCG (5000 UT) TABS Take 5,000 mg by mouth 3 (three) times a week.    Yes [provider]  clopidogrel (PLAVIX) 75 MG tablet Take 1 tablet (75 mg total) by mouth daily. 09/17/18  Yes Branch, Alphonse Guild, MD  docusate sodium (COLACE) 100 MG capsule Take 1 capsule (100 mg total) by mouth every 12 (twelve) hours. Patient taking differently: Take 100 mg by mouth daily.  06/24/17  Yes Long, Wonda Olds, MD  furosemide (LASIX) 20 MG tablet TAKE 1 TABLET BY MOUTH AS NEEDED ONLY  HAS  TO  TAKE  IT  WHEN  SHE  GAINS  2  POUNDS Patient taking differently: Take 60 mg by mouth daily.  07/18/18  Yes BranchAlphonse Guild, MD  gabapentin (NEURONTIN) 300 MG capsule Take 2 capsules (600 mg total) by mouth at bedtime. 11/18/17  Yes Mahala Menghini, PA-C  glucose blood test strip 1 each by Other route 4 (four) times daily. Use as instructed bid. E11.65 11/27/17  Yes Nida, Marella Chimes, MD  hydrALAZINE (APRESOLINE) 25 MG tablet Take 1.5 tablets (37.5 mg total) by mouth 2 (two) times daily. 08/29/18  Yes Branch, Alphonse Guild, MD  Insulin Glargine (TOUJEO SOLOSTAR  [place]) Inject 5 Units into the skin at bedtime.   Yes [provider]  metoprolol tartrate (LOPRESSOR) 25 MG tablet Take 0.5 tablets (12.5 mg total) by mouth 2 (two) times daily. 12/23/17  Yes BranchAlphonse Guild, MD  Multiple Vitamin (MULTIVITAMIN WITH MINERALS) TABS tablet Take 1 tablet by mouth daily.   Yes [provider]  NP THYROID 120 MG tablet Take 120 mg by mouth daily. 08/28/18  Yes [provider]  pantoprazole (PROTONIX) 40 MG tablet Take 1 tablet (40 mg total) by mouth 2 (two) times daily. 30 minutes before breakfast Patient taking differently: Take 40 mg by mouth daily before breakfast.  11/27/17  Yes Annitta Needs, NP  sodium bicarbonate 650 MG tablet Take 650 mg by mouth 2 (two) times daily.   Yes [provider]  sulfamethoxazole-trimethoprim (BACTRIM DS) 800-160 MG tablet Take 1 tablet by mouth 2 (two) times a day. 10/30/18  Yes [provider]  ULORIC 40 MG tablet Take 40 mg by mouth daily.  09/11/17  Yes [provider]  vitamin C (ASCORBIC ACID) 500 MG tablet Take 500 mg by mouth daily.   Yes [provider]    Inpatient Medications: Scheduled Meds:  aspirin EC  81 mg Oral Daily   calcium acetate  667 mg Oral TID WC   Chlorhexidine Gluconate Cloth  6 each Topical Q0600   clopidogrel  75 mg Oral Daily   docusate sodium  100 mg Oral Q12H   febuxostat  40 mg Oral Daily   heparin  5,000 Units Subcutaneous Q8H   hydrALAZINE  37.5 mg Oral BID   insulin aspart  0-9 Units Subcutaneous Q4H   mouth rinse  15 mL Mouth Rinse BID   metoprolol tartrate  12.5 mg Oral BID   pantoprazole  40 mg Oral QAC breakfast   sodium bicarbonate  650 mg Oral BID   thyroid  120 mg Oral Q0600   Continuous Infusions:  PRN Meds: acetaminophen **OR** acetaminophen, nitroGLYCERIN, ondansetron **OR** ondansetron (ZOFRAN) IV, polyethylene glycol  Allergies:    Allergies  Allergen Reactions   Insulin Glargine Swelling    "Makes me  swell like a balloon all over", including face, but without any respiratory distress or rashes. Associated with weight gain.   Statins Other (See Comments)    "I've tried them all; my muscle aches were so bad I couldn't walk".    Social History:   Social History   Socioeconomic History   Marital status: Widowed    Spouse name: [name]   Number of children: 2   Years of education: 12   Highest education level: Not on file  Occupational History   Occupation: retired  Scientist, product/process development strain: Not on file   Food insecurity    Worry: Not on file    Inability: Not on Lexicographer needs    Medical: Not on file    Non-medical: Not on file  Tobacco Use   Smoking status: Never Smoker   Smokeless tobacco: Never Used  Substance and Sexual Activity   Alcohol use: No    Alcohol/week: 0.0 standard drinks   Drug use: No   Sexual activity: Not Currently  Lifestyle   Physical activity    Days per week: Not on file    Minutes per session: Not on file   Stress: Not on file  Relationships   Social connections    Talks on phone: Not on file    Gets together: Not on file    Attends religious service: Not on file    Active member of club or organization: Not on file    Attends meetings of clubs or organizations: Not on file    Relationship status: Not on file   Intimate partner violence    Fear of current or ex partner: Not on file    Emotionally abused: Not on file    Physically abused: Not on file    Forced sexual activity: Not on file  Other Topics Concern   Not on file  Social History Narrative   Lives with significant other/partner [name]   He is her caregiver   Moved from [facility]    Family History:   Family History  Problem Relation Age of Onset   Diabetes Mother    Asthma  Mother    Early death Mother 87       Pneumonia   Early death Father        Killed at rodeo   CAD Neg Hx    GI Bleed Neg Hx    Family Status:  Family Status   Relation Name Status   Mother  Deceased   Father  Deceased   Son bill Alive   Son rick Alive   MGM  Deceased   MGF  Deceased   PGM  Deceased   PGF  Deceased   Neg Hx  (Not Specified)    ROS:  Please see the history of present illness.  All other ROS reviewed and negative.     Physical Exam/Data:   Vitals:   11/07/18 0900 11/07/18 0941 11/07/18 1000 11/07/18 1140  BP: (!) 131/49  (!) 130/54   Pulse: 76 81 78   Resp: 15  15   Temp:    98.7 F (37.1 C)  TempSrc:    Oral  SpO2: 94%  93%   Weight:      Height:        Intake/Output Summary (Last 24 hours) at 11/07/2018 1256 Last data filed at 11/07/2018 1030 Gross per 24 hour  Intake 778.17 ml  Output 4 ml  Net 774.17 ml   Filed Weights   11/06/18 0834  Weight: 78.9 kg   Body mass index is 29.87 kg/m.  General:  Well nourished, well developed, in no acute distress HEENT: normal Lymph: no adenopathy Neck: Mild JVD Endocrine:  No thryomegaly Vascular: No carotid bruits; 4/4 extremity pulses 2+, without bruits  Cardiac:  normal S1, S2; RRR; no murmur  Lungs: Bilateral rales, no wheezing, rhonchi    Abd: soft, nontender, no hepatomegaly  Ext: no edema Musculoskeletal:  No deformities, BUE and BLE strength normal and equal Skin: warm and dry  Neuro:  CNs 2-12 intact, no focal abnormalities noted Psych:  Normal affect   EKG:  The EKG was personally reviewed and demonstrates: Sinus rhythm, heart rate 91, no acute ischemic abnormalities Telemetry:  Telemetry was personally reviewed and demonstrates: Sinus rhythm, sinus tach  Relevant CV Studies:  ECHO: 10/17/2017 - Left ventricle: The cavity size was normal. There was moderate   concentric hypertrophy. Systolic function was normal. The   estimated ejection fraction was in the range of 60% to 65%.   Images were inadequate for LV wall motion assessment. Features   are consistent with a pseudonormal left ventricular filling   pattern, with concomitant  abnormal relaxation and increased   filling pressure (grade 2 diastolic dysfunction). Doppler   parameters are consistent with high ventricular filling pressure. - Aortic valve: Poorly visualized. Valve area (VTI): 2.38 cm^2.   Valve area (Vmax): 2.16 cm^2. Valve area (Vmean): 2.26 cm^2. - Mitral valve: Calcified annulus. Moderate focal calcification of   the anterior leaflet (medial segment(s)). - Left atrium: The atrium was mildly dilated. - Atrial septum: There was increased thickness of the septum,   consistent with lipomatous hypertrophy. - Tricuspid valve: There was trivial regurgitation. - Pulmonary arteries: PA peak pressure: 38 mm Hg (S).  Impressions:  - COmpared to prior study, PASP is slightly increased from 35 to   7mmHg. The right ventricular systolic pressure was increased   consistent with mild pulmonary hypertension.   Laboratory Data:  Chemistry Recent Labs  Lab 11/06/18 0926 11/07/18 0453  NA 139 139  K 5.4* 5.1  CL 104 107  CO2 17* 18*  GLUCOSE 108* 112*  BUN 100* 103*  CREATININE 6.82* 7.10*  CALCIUM 8.5* 8.1*  GFRNONAA 5* 5*  GFRAA 6* 6*  ANIONGAP 18* 14    Lab Results  Component Value Date   ALT 22 09/30/2018   AST 33 09/30/2018   ALKPHOS 67 09/30/2018   BILITOT 0.4 09/30/2018   Hematology Recent Labs  Lab 11/06/18 0926  WBC 10.7*  RBC 2.94*  HGB 9.0*  HCT 29.0*  MCV 98.6  MCH 30.6  MCHC 31.0  RDW 15.3  PLT 225   Cardiac Enzymes Recent Labs  Lab 11/06/18 0926 11/06/18 1253 11/06/18 1848 11/07/18 0033 11/07/18 0453 11/07/18 1125  TROPONINI 0.03* 0.04* 0.05* 0.05* 0.05* 4.14*   No results for input(s): TROPIPOC in the last 168 hours.  BNPNo results for input(s): BNP, PROBNP in the last 168 hours.  DDimer  Recent Labs  Lab 11/06/18 0926  DDIMER 1.77*   TSH:  Lab Results  Component Value Date   TSH 0.326 (L) 11/06/2018   Lipids: Lab Results  Component Value Date   CHOL 526 (H) 06/12/2017   HDL 42 06/12/2017     LDLCALC UNABLE TO CALCULATE IF TRIGLYCERIDE OVER 400 mg/dL 06/12/2017   TRIG 662 (H) 06/12/2017   CHOLHDL 12.5 06/12/2017   HgbA1c: Lab Results  Component Value Date   HGBA1C 6.1 (H) 11/06/2017   Magnesium: No results found for: MG   Radiology/Studies:  Ct Chest Wo Contrast  Result Date: 11/07/2018 CLINICAL DATA:  Evaluate chest wall pain EXAM: CT CHEST WITHOUT CONTRAST TECHNIQUE: Multidetector CT imaging of the chest was performed following the standard protocol without IV contrast. COMPARISON:  CT chest 09/17/2016 FINDINGS: Cardiovascular: Cardiac enlargement. No pericardial effusion. Aortic atherosclerosis. Lad, left circumflex and RCA coronary artery calcifications. Mediastinum/Nodes: Normal appearance of the thyroid gland. The trachea appears patent and is midline. Normal appearance of the esophagus. Prominent mediastinal lymph nodes measuring up to 1.2 cm within the right paratracheal chain noted. No adenopathy. The hilar structures are suboptimally evaluated due to lack of IV contrast. Lungs/Pleura: There are small bilateral pleural effusions. Interlobular septal thickening is identified bilaterally compatible with interstitial edema. Airspace consolidation and subsegmental atelectasis noted in both lower lobes, lingula and posterolateral left upper lobe. Upper Abdomen: No acute abnormality. Musculoskeletal: No chest wall mass or suspicious bone lesions identified. IMPRESSION: 1. Bilateral multifocal airspace consolidation and atelectasis. Findings consistent with multifocal pneumonia. 2. Cardiac enlargement, 3 vessel coronary artery calcifications, pulmonary edema, and bilateral pleural effusions compatible with CHF. 3.  Aortic Atherosclerosis (ICD10-I70.0). Electronically Signed   By: Kerby Moors M.D.   On: 11/07/2018 08:05   Nm Pulmonary Perfusion  Result Date: 11/06/2018 CLINICAL DATA:  Chest pain and shortness of breath for 1 day, elevated D-dimer, chronic kidney disease with  elevated creatinine EXAM: NUCLEAR MEDICINE PERFUSION LUNG SCAN TECHNIQUE: Perfusion images were obtained in multiple projections after intravenous injection of radiopharmaceutical. Ventilation scans intentionally deferred if perfusion scan and chest x-ray adequate for interpretation during COVID 19 epidemic. RADIOPHARMACEUTICALS:  1.5 mCi Tc-20m MAA IV COMPARISON:  09/27/2018 Correlation: Chest radiograph 11/06/2018 FINDINGS: Normal perfusion images of both lungs. No segmental or subsegmental perfusion defects. Ventilation exam not performed. IMPRESSION: Normal perfusion lung scan, unchanged. Electronically Signed   By: Lavonia Dana M.D.   On: 11/06/2018 16:27   Dg Chest Port 1 View  Result Date: 11/06/2018 CLINICAL DATA:  Chest pain EXAM: PORTABLE CHEST 1 VIEW COMPARISON:  09/26/2018 FINDINGS: Cardiomegaly. No overt edema. Bibasilar atelectasis noted. No visible significant effusions  or acute bony abnormality. IMPRESSION: Cardiomegaly, bibasilar atelectasis.  No overt edema. Electronically Signed   By: Rolm Baptise M.D.   On: 11/06/2018 09:57    Assessment and Plan:   Principal Problem:   Non-ST elevation (NSTEMI) myocardial infarction [facility]) Active Problems:   Multifocal pneumonia  1.  Non-STEMI  -Continue to cycle enzymes - She is currently on aspirin, Plavix, beta-blocker but no statin. -She has been intolerant to statins in the past, need to refer her to the lipid clinic - Previously, no cardiac catheterization due to renal insufficiency. - However, if she is starting dialysis now and this is to continue, could possibly do a heart catheterization next week.  2.  Multifocal pneumonia: -Per IM, O2 sats are in the high 80s on 6 L by nasal cannula  3.  Acute on chronic renal failure - volume management per IM/Nephrology  For questions or updates, please contact [facility] Please consult [email or web address] for contact info under Cardiology/STEMI.   Signed, Rosaria Ferries, PA-C  11/07/2018  12:56 PM  The patient was seen and examined, and I agree with the history, physical exam, assessment and plan as documented above, with modifications as noted below. I have also personally reviewed all relevant documentation, old records, labs, and both radiographic and cardiovascular studies. I have also independently interpreted old and new ECG's.  Briefly, this is a 78 year old woman with a history of chronic kidney disease stage V, normocytic anemia, GI bleeding, and a history of non-STEMI who presented to the ED on 11/06/2018 with chest pain.  Troponins have peaked at 4.14.  She was given nitroglycerin and her chest pain resolved.  She is currently resting comfortably.  ECG demonstrates sinus rhythm with nonspecific IVCD and nonspecific ST segment abnormalities.  Chest x-ray yesterday showed bibasilar atelectasis with no overt or edema.  Due to elevated d-dimer, she underwent a VQ scan yesterday which showed normal lung perfusion.  Chest CT today demonstrates bilateral multifocal pneumonia with cardiac enlargement and three-vessel coronary artery calcifications with some degree of pulmonary edema and pleural effusions.  She is markedly uremic with a BUN of 103 and creatinine is 7.1.  Nephrology has evaluated her and plans to start hemodialysis with ultrafiltration today.  She has a history of normocytic anemia (due to advanced CKD and iron deficiency) and has been receiving Retacrit injections monthly with hematology.  She is currently on dual antiplatelet therapy with aspirin Plavix and is also on a beta-blocker.  She has not tolerated statin therapy in the past.  Echocardiogram on 10/17/2017 reviewed with normal LV systolic function and grade 2 diastolic dysfunction, LVEF 60 to 65%.  There was moderate focal calcification of the anterior mitral leaflet.  While she has a non-STEMI, it is unclear if symptoms on presentation were more related to hypervolemia and multifocal pneumonia versus  cardiac ischemia.  She currently denies chest pain.  She will ultimately require cardiac catheterization once she has demonstrated stability with hemodialysis and after treatment for presumed multifocal pneumonia (may be all CHF).  Whether this needs to be done next week while hospitalized is yet to be determined.  I would continue to trend troponins in the meantime.  I will order a 2-D echocardiogram with Doppler to evaluate cardiac structure, function, and regional wall motion.     Kate Sable, MD, [facility]  11/07/2018 1:25 PM

## 2018-11-07 NOTE — Procedures (Signed)
   INITIAL HEMODIALYSIS TREATMENT NOTE:  First ever hemodialysis session completed via right upper arm AVF (17g/antegrade). Ultrafiltration limited by new onset Afib, 115-135 sustained, during last hour of treatment.  Pt asymptomatic and BP stable but UF was suspended.  Discussed with Dr. Marval Regal.  No further orders received.   All blood was returned and hemostasis was achieved in 15 minutes.  Run time: 2.5 hours Net UF 559cc   Rockwell Alexandria, RN

## 2018-11-07 NOTE — Consult Note (Signed)
Reason for Consult: AKI/CKD stage 5 Referring Physician: Dyann Kief, MD  [name] is an 78 y.o. female.  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  Workup in the ED was notable for mildly elevated troponin, nonspecific t wave changes, VQ scan normal, but also noted to have BUN/Cr of 100/6.82.  We have been consulted to help further evaluate and manage her AKI/CKD.  She is normally followed by [name] and was last seen 11/05/18.  Her Cr has been climbing since January but this more recent jump is likely due to her being treated with Bactrim DS bid for the past 2 weeks for a foot ulcer.  She admits to feeling more fatigued, with decreased appetite and funny metallic taste in her mouth.  She also reports jerking of her limbs and difficulty with ambulation.  She has a RUE AVF placed in January by [name] and has been preparing for dialysis for the past few months.  The trend in Scr is seen below.   Trend in Creatinine: Creatinine, Ser  Date/Time Value Ref Range Status  11/07/2018 04:53 AM 7.10 (H) 0.44 - 1.00 mg/dL Final  11/06/2018 09:26 AM 6.82 (H) 0.44 - 1.00 mg/dL Final  10/29/2018 12:24 PM 3.89 (H) 0.44 - 1.00 mg/dL Final  09/30/2018 10:15 AM 4.53 (H) 0.44 - 1.00 mg/dL Final  07/28/2018 10:12 AM 3.23 (H) 0.44 - 1.00 mg/dL Final  07/08/2018 01:08 PM 3.01 (H) 0.44 - 1.00 mg/dL Final  05/30/2018 12:00 PM 2.92 (H) 0.44 - 1.00 mg/dL Final  03/28/2018 12:07 PM 2.50 (H) 0.44 - 1.00 mg/dL Final  11/22/2017 10:55 AM 2.46 (H) 0.44 - 1.00 mg/dL Final  11/06/2017 11:23 AM 2.48 (H) 0.44 - 1.00 mg/dL Final  10/25/2017 08:33 AM 2.50 (H) 0.44 - 1.00 mg/dL Final  10/18/2017 05:18 AM 2.77 (H) 0.44 - 1.00 mg/dL Final  10/17/2017 04:52 AM 2.52 (H) 0.44 - 1.00 mg/dL Final  10/17/2017 02:16 AM 2.56 (H) 0.44 - 1.00 mg/dL Final  09/26/2017 11:32 AM  2.55 (H) 0.44 - 1.00 mg/dL Final  08/30/2017 09:06 AM 2.10 (H) 0.44 - 1.00 mg/dL Final  08/16/2017 11:51 AM 2.25 (H) 0.44 - 1.00 mg/dL Final  08/09/2017 02:33 AM 2.48 (H) 0.44 - 1.00 mg/dL Final  08/08/2017 03:55 AM 2.44 (H) 0.44 - 1.00 mg/dL Final  08/07/2017 02:17 AM 2.71 (H) 0.44 - 1.00 mg/dL Final  08/02/2017 09:19 AM 2.50 (H) 0.44 - 1.00 mg/dL Final  07/31/2017 05:08 AM 2.51 (H) 0.44 - 1.00 mg/dL Final  07/30/2017 04:01 PM 2.65 (H) 0.44 - 1.00 mg/dL Final  07/22/2017 11:20 AM 2.51 (H) 0.44 - 1.00 mg/dL Final  06/20/2017 11:36 AM 2.15 (H) 0.44 - 1.00 mg/dL Final  06/12/2017 10:50 AM 2.21 (H) 0.44 - 1.00 mg/dL Final  05/22/2017 02:56 PM 2.19 (H) 0.44 - 1.00 mg/dL Final  03/19/2017 03:37 PM 1.82 (H) 0.44 - 1.00 mg/dL Final  03/14/2017 12:44 PM 1.98 (H) 0.44 - 1.00 mg/dL Final  03/12/2017 04:13 AM 2.15 (H) 0.44 - 1.00 mg/dL Final  03/11/2017 06:28 AM 2.44 (H) 0.44 - 1.00 mg/dL Final  03/10/2017 04:50 AM 2.46 (H) 0.44 - 1.00 mg/dL Final  03/09/2017 08:24 AM 2.78 (H) 0.44 - 1.00 mg/dL Final  03/08/2017 03:27 PM 3.02 (H) 0.44 - 1.00 mg/dL Final  02/04/2017 03:14 PM 2.21 (H) 0.44 - 1.00 mg/dL  Final  11/23/2016 10:01 AM 2.47 (H) 0.44 - 1.00 mg/dL Final  11/07/2016 12:35 PM 2.56 (H) 0.44 - 1.00 mg/dL Final  10/29/2016 04:54 AM 2.25 (H) 0.44 - 1.00 mg/dL Final  10/27/2016 08:21 AM 2.67 (H) 0.44 - 1.00 mg/dL Final  10/26/2016 04:54 AM 2.73 (H) 0.44 - 1.00 mg/dL Final  10/25/2016 09:04 PM 2.76 (H) 0.44 - 1.00 mg/dL Final  10/09/2016 07:06 AM 2.61 (H) 0.44 - 1.00 mg/dL Final  10/06/2016 08:34 AM 2.81 (H) 0.44 - 1.00 mg/dL Final  10/05/2016 02:18 AM 2.88 (H) 0.44 - 1.00 mg/dL Final  10/04/2016 03:25 PM 3.28 (H) 0.44 - 1.00 mg/dL Final  02/28/2015 06:30 AM 1.35 (H) 0.44 - 1.00 mg/dL Final  02/25/2015 05:17 PM 1.67 (H) 0.44 - 1.00 mg/dL Final  02/23/2015 01:55 PM 1.97 (H) 0.44 - 1.00 mg/dL Final  02/12/2015 01:53 AM 1.30 (H) 0.44 - 1.00 mg/dL Final  02/10/2015 05:24 AM 1.29 (H) 0.44 - 1.00  mg/dL Final  02/09/2015 04:43 AM 1.57 (H) 0.44 - 1.00 mg/dL Final  02/08/2015 05:24 AM 1.41 (H) 0.44 - 1.00 mg/dL Final    PMH:   Past Medical History:  Diagnosis Date  . Anemia   . Arthritis   . Blood transfusion without reported diagnosis   . Cataract   . Chronic anemia   . Chronic combined systolic and diastolic CHF (congestive heart failure) ([place])    a. 2D echo 08/2016 at [facility]: EF 50-55% with inferior wall HK, impaired LV filling, fair study.  . CKD (chronic kidney disease), stage III ([place])   . Diabetes ([place])   . Gastritis   . GERD (gastroesophageal reflux disease)   . Gout   . Headache   . HTN (hypertension)   . Hyperlipidemia   . Hypothyroidism   . Iron deficiency anemia 11/08/2016  . Normocytic anemia 10/26/2016  . NSTEMI (non-ST elevated myocardial infarction) ([employer])    a. Complex admission 08/2016 - with severe hyperglycemia, AKI on CKD, severe anemia down to Hgb 6.8, acute combined CHF, troponin of 8.5, cath deferred due to renal dysfunction.  . Thyroid disease   . Wears dentures     PSH:   Past Surgical History:  Procedure Laterality Date  . BASCILIC VEIN TRANSPOSITION Right 06/12/2018   Procedure: FIRST STAGE BASCILIC VEIN TRANSPOSITION RIGHT ARM;  Surgeon: Rosetta Posner, MD;  Location: [place];  Service: Vascular;  Laterality: Right;  . BASCILIC VEIN TRANSPOSITION Right 08/07/2018   Procedure: BASCILIC VEIN TRANSPOSITION SECOND STAGE RIGHT ARM;  Surgeon: Rosetta Posner, MD;  Location: [place];  Service: Vascular;  Laterality: Right;  . CATARACT EXTRACTION     left  . COLONOSCOPY WITH PROPOFOL N/A 10/08/2016   5 mm transverse colon polyp note resected due to plavix. hemorrhoids  . ENTEROSCOPY N/A 08/08/2017   Procedure: ENTEROSCOPY;  Surgeon: Daneil Dolin, MD;  [name];  Service: Endoscopy;  Laterality: N/A;  . ESOPHAGOGASTRODUODENOSCOPY N/A 10/06/2016   mild chroni gastritis, negative H.pylori  . ESOPHAGOGASTRODUODENOSCOPY (EGD) WITH PROPOFOL N/A  03/12/2017   mild edema/erythema of stomach, small bowel biopsy with focal villous tip lymphocytosis, ?partially developed celiac  . 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 GIVENS CAPSULE STUDY  10/08/2016   normal  . GIVENS CAPSULE STUDY N/A 10/29/2016   occasional gastric erosion, unremarkable small  bowel  . ORIF HUMERUS FRACTURE Left 02/07/2015   Procedure: OPEN REDUCTION INTERNAL FIXATION (ORIF) PROXIMAL HUMERUS FRACTURE;  Surgeon: Marybelle Killings, MD;  Location: [place];  Service: Orthopedics;  Laterality: Left;  . teeth extractions    . THYROID SURGERY    . TOTAL HIP ARTHROPLASTY Left 02/07/2015   Procedure: TOTAL HIP ARTHROPLASTY ANTERIOR APPROACH ;  Surgeon: Marybelle Killings, MD;  Location: [place];  Service: Orthopedics;  Laterality: Left;  . WRIST SURGERY Left     Allergies:  Allergies  Allergen Reactions  . Insulin Glargine Swelling    "Makes me swell like a balloon all over", including face, but without any respiratory distress or rashes. Associated with weight gain.  . Statins Other (See Comments)    "I've tried them all; my muscle aches were so bad I couldn't walk".    Medications:   Prior to Admission medications   Medication Sig Start Date End Date Taking? Authorizing Provider  Alcohol Swabs (B-D SINGLE USE SWABS REGULAR) PADS  02/26/18  Yes [provider]  amLODipine (NORVASC) 10 MG tablet Take 1 tablet (10 mg total) by mouth daily. 04/02/18  Yes Branch, Alphonse Guild, MD  BD PEN NEEDLE MICRO U/F 32G X 6 MM MISC  08/04/18  Yes [provider]  calcium acetate (PHOSLO) 667 MG capsule Take 667 mg by mouth 3 (three) times daily with meals.   Yes [provider]  Cholecalciferol (VITAMIN D-3) 125 MCG (5000 UT) TABS Take 5,000 mg by mouth 3  (three) times a week.    Yes [provider]  clopidogrel (PLAVIX) 75 MG tablet Take 1 tablet (75 mg total) by mouth daily. 09/17/18  Yes Branch, Alphonse Guild, MD  docusate sodium (COLACE) 100 MG capsule Take 1 capsule (100 mg total) by mouth every 12 (twelve) hours. Patient taking differently: Take 100 mg by mouth daily.  06/24/17  Yes Long, Wonda Olds, MD  furosemide (LASIX) 20 MG tablet TAKE 1 TABLET BY MOUTH AS NEEDED ONLY  HAS  TO  TAKE  IT  WHEN  SHE  GAINS  2  POUNDS Patient taking differently: Take 60 mg by mouth daily.  07/18/18  Yes BranchAlphonse Guild, MD  gabapentin (NEURONTIN) 300 MG capsule Take 2 capsules (600 mg total) by mouth at bedtime. 11/18/17  Yes Mahala Menghini, PA-C  glucose blood test strip 1 each by Other route 4 (four) times daily. Use as instructed bid. E11.65 11/27/17  Yes Nida, Marella Chimes, MD  hydrALAZINE (APRESOLINE) 25 MG tablet Take 1.5 tablets (37.5 mg total) by mouth 2 (two) times daily. 08/29/18  Yes Branch, Alphonse Guild, MD  Insulin Glargine (TOUJEO SOLOSTAR [place]) Inject 5 Units into the skin at bedtime.   Yes [provider]  metoprolol tartrate (LOPRESSOR) 25 MG tablet Take 0.5 tablets (12.5 mg total) by mouth 2 (two) times daily. 12/23/17  Yes BranchAlphonse Guild, MD  Multiple Vitamin (MULTIVITAMIN WITH MINERALS) TABS tablet Take 1 tablet by mouth daily.   Yes [provider]  NP THYROID 120 MG tablet Take 120 mg by mouth daily. 08/28/18  Yes [provider]  pantoprazole (PROTONIX) 40 MG tablet Take 1 tablet (40 mg total) by mouth 2 (two) times daily. 30 minutes before breakfast Patient taking differently: Take 40 mg by mouth daily before breakfast.  11/27/17  Yes Annitta Needs, NP  sodium bicarbonate 650 MG tablet Take 650 mg by mouth 2 (two) times daily.   Yes [provider]  sulfamethoxazole-trimethoprim (BACTRIM DS) 800-160 MG tablet Take 1 tablet by mouth 2 (two) times a day. 10/30/18  Yes [provider]  ULORIC 40 MG  tablet Take 40 mg by mouth daily.  09/11/17  Yes [provider]  vitamin C (ASCORBIC ACID) 500 MG tablet Take 500 mg by mouth daily.   Yes [provider]    Inpatient medications: . aspirin EC  81 mg Oral Daily  . calcium acetate  667 mg Oral TID WC  . clopidogrel  75 mg Oral Daily  . docusate sodium  100 mg Oral Q12H  . febuxostat  40 mg Oral Daily  . heparin  5,000 Units Subcutaneous Q8H  . hydrALAZINE  37.5 mg Oral BID  . insulin aspart  0-9 Units Subcutaneous Q4H  . mouth rinse  15 mL Mouth Rinse BID  . metoprolol tartrate  12.5 mg Oral BID  . pantoprazole  40 mg Oral QAC breakfast  . sodium bicarbonate  650 mg Oral BID  . sulfamethoxazole-trimethoprim  1 tablet Oral Q2200  . thyroid  120 mg Oral Q0600    Discontinued Meds:   Medications Discontinued During This Encounter  Medication Reason  . amLODipine (NORVASC) tablet 10 mg   . vancomycin (VANCOCIN) IVPB 1000 mg/200 mL premix     Social History:  reports that she has never smoked. She has never used smokeless tobacco. She reports that she does not drink alcohol or use drugs.  Family History:   Family History  Problem Relation Age of Onset  . Diabetes Mother   . Asthma Mother   . Early death Mother 13       Pneumonia  . Early death Father        Killed at [place]  . CAD Neg Hx   . GI Bleed Neg Hx     Pertinent items are noted in HPI. Weight change:   Intake/Output Summary (Last 24 hours) at 11/07/2018 1115 Last data filed at 11/07/2018 1030 Gross per 24 hour  Intake 778.17 ml  Output 4 ml  Net 774.17 ml   BP (!) 130/54   Pulse 78   Temp 98.7 F (37.1 C) (Axillary)   Resp 15   Ht _0  (1.626 m)   Wt 78.9 kg   SpO2 93%   BMI 29.87 kg/m  Vitals:   11/07/18 0830 11/07/18 0900 11/07/18 0941 11/07/18 1000  BP: (!) 118/43 (!) 131/49  (!) 130/54  Pulse: 75 76 81 78  Resp: _1 Temp:      TempSrc:      SpO2: 96% 94%  93%  Weight:      Height:         General appearance:  cooperative, fatigued and no distress Head: Normocephalic, without obvious abnormality, atraumatic Resp: clear to auscultation bilaterally Cardio: no rub GI: soft, non-tender; bowel sounds normal; no masses,  no organomegaly Extremities: extremities normal, atraumatic, no cyanosis or edema and RUE AVF +T/B Neuro:  +asterixis  Labs: Basic Metabolic Panel: Recent Labs  Lab 11/06/18 0926 11/07/18 0453  NA 139 139  K 5.4* 5.1  CL 104 107  CO2 17* 18*  GLUCOSE 108* 112*  BUN 100* 103*  CREATININE 6.82* 7.10*  CALCIUM 8.5* 8.1*   Liver Function Tests: No results for input(s): AST, ALT, ALKPHOS, BILITOT, PROT, ALBUMIN in the last 168 hours. No results for input(s): LIPASE, AMYLASE in the last 168 hours. No results for input(s): AMMONIA in the last 168 hours. CBC:  Recent Labs  Lab 11/06/18 0926  WBC 10.7*  NEUTROABS 9.0*  HGB 9.0*  HCT 29.0*  MCV 98.6  PLT 225   PT/INR: _0 (inr:5) Cardiac Enzymes: ) Recent Labs  Lab 11/06/18 0926 11/06/18 1253 11/06/18 1848 11/07/18 0033 11/07/18 0453  TROPONINI 0.03* 0.04* 0.05* 0.05* 0.05*   CBG: Recent Labs  Lab 11/06/18 1338 11/07/18 0006 11/07/18 0417 11/07/18 0738  GLUCAP 114* 93 107* 118*    Iron Studies: No results for input(s): IRON, TIBC, TRANSFERRIN, FERRITIN in the last 168 hours.  Xrays/Other Studies: Ct Chest Wo Contrast  Result Date: 11/07/2018 CLINICAL DATA:  Evaluate chest wall pain EXAM: CT CHEST WITHOUT CONTRAST TECHNIQUE: Multidetector CT imaging of the chest was performed following the standard protocol without IV contrast. COMPARISON:  CT chest 09/17/2016 FINDINGS: Cardiovascular: Cardiac enlargement. No pericardial effusion. Aortic atherosclerosis. Lad, left circumflex and RCA coronary artery calcifications. Mediastinum/Nodes: Normal appearance of the thyroid gland. The trachea appears patent and is midline. Normal appearance of the esophagus. Prominent mediastinal lymph nodes measuring up to 1.2  cm within the right paratracheal chain noted. No adenopathy. The hilar structures are suboptimally evaluated due to lack of IV contrast. Lungs/Pleura: There are small bilateral pleural effusions. Interlobular septal thickening is identified bilaterally compatible with interstitial edema. Airspace consolidation and subsegmental atelectasis noted in both lower lobes, lingula and posterolateral left upper lobe. Upper Abdomen: No acute abnormality. Musculoskeletal: No chest wall mass or suspicious bone lesions identified. IMPRESSION: 1. Bilateral multifocal airspace consolidation and atelectasis. Findings consistent with multifocal pneumonia. 2. Cardiac enlargement, 3 vessel coronary artery calcifications, pulmonary edema, and bilateral pleural effusions compatible with CHF. 3.  Aortic Atherosclerosis (ICD10-I70.0). Electronically Signed   By: Kerby Moors M.D.   On: 11/07/2018 08:05   Nm Pulmonary Perfusion  Result Date: 11/06/2018 CLINICAL DATA:  Chest pain and shortness of breath for 1 day, elevated D-dimer, chronic kidney disease with elevated creatinine EXAM: NUCLEAR MEDICINE PERFUSION LUNG SCAN TECHNIQUE: Perfusion images were obtained in multiple projections after intravenous injection of radiopharmaceutical. Ventilation scans intentionally deferred if perfusion scan and chest x-ray adequate for interpretation during COVID 19 epidemic. RADIOPHARMACEUTICALS:  1.5 mCi Tc-48mMAA IV COMPARISON:  09/27/2018 Correlation: Chest radiograph 11/06/2018 FINDINGS: Normal perfusion images of both lungs. No segmental or subsegmental perfusion defects. Ventilation exam not performed. IMPRESSION: Normal perfusion lung scan, unchanged. Electronically Signed   By: MLavonia DanaM.D.   On: 11/06/2018 16:27   Dg Chest Port 1 View  Result Date: 11/06/2018 CLINICAL DATA:  Chest pain EXAM: PORTABLE CHEST 1 VIEW COMPARISON:  09/26/2018 FINDINGS: Cardiomegaly. No overt edema. Bibasilar atelectasis noted. No visible significant  effusions or acute bony abnormality. IMPRESSION: Cardiomegaly, bibasilar atelectasis.  No overt edema. Electronically Signed   By: KRolm BaptiseM.D.   On: 11/06/2018 09:57     Assessment/Plan: 1.  AKI/CKD stage 5- presumably due to bactrim DS with advanced underlying CKD.  She has asterixis, metabolic acidosis, hyperkalemia, and has had a eGFR of 10 or less for the past few months.  Given her uremia, will plan to initiate HD today and again tomorrow.  She will need to have outpatient HD arranged by Dr. BLowanda Fosterand I will contact CRamiro Harvest PA-C and notify that she has started dialysis. 2. Chest pain- resolved.  W/u per primary 3. SOB- CT with pleural effusions and interstitial pulmonary infiltrates, concerning for CHF.  Will plan for UF with HD and recheck CXR and if improved infiltrates, makes pneumonia less likely 4. Anemia of CKD- will  start ESA and replete iron stores. 5. SHPTH- iPTH 114.   Will need to check phos. 6. DM- per primary 7. HTN- stable 8. Foot ulcer- placed on bactrim DS by podiatry.  Stop bactrim and follow. 9. CAD- stable   Governor Rooks Esra Frankowski 11/07/2018, 11:15 AM

## 2018-11-07 NOTE — TOC Initial Note (Signed)
Transition of Care [facility]) - Initial/Assessment Note    Patient Details  Name: [name] MRN: [id number] Date of Birth: Aug 24, 1940  [facility] CM/SW Contact:    Shade Flood, LCSW Phone Number: 11/07/2018, 3:45 PM  Clinical Narrative:                  Pt admitted from home. MD indicating pt will be initiated on HD while in the hospital. Pt has been following with Dr. Lowanda Foster and had a fistula placed earlier this year in anticipation of HD. Will send referral to [facility] once pt has first treatment notes and Hep B Panel labs results in the record. Will follow to assist with TOC needs including outpatient HD chair schedule.    Expected Discharge Plan: Home/Self Care Barriers to Discharge: Continued Medical Work up   Patient Goals and CMS Choice        Expected Discharge Plan and Services Expected Discharge Plan: Home/Self Care In-house Referral: Clinical Social Work   Post Acute Care Choice: Dialysis Living arrangements for the past 2 months: Single Family Home                                      Prior Living Arrangements/Services Living arrangements for the past 2 months: Single Family Home Lives with:: Self Patient language and need for interpreter reviewed:: Yes Do you feel safe going back to the place where you live?: Yes      Need for Family Participation in Patient Care: No (Comment) Care giver support system in place?: Yes (comment)   Criminal Activity/Legal Involvement Pertinent to Current Situation/Hospitalization: No - Comment as needed  Activities of Daily Living Home Assistive Devices/Equipment: None ADL Screening (condition at time of admission) Patient's cognitive ability adequate to safely complete daily activities?: Yes Is the patient deaf or have difficulty hearing?: No Does the patient have difficulty seeing, even when wearing glasses/contacts?: No Does the patient have difficulty concentrating, remembering,  or making decisions?: No Patient able to express need for assistance with ADLs?: Yes Does the patient have difficulty dressing or bathing?: No Independently performs ADLs?: Yes (appropriate for developmental age) Does the patient have difficulty walking or climbing stairs?: Yes Weakness of Legs: Both Weakness of Arms/Hands: None  Permission Sought/Granted                  Emotional Assessment Appearance:: Appears stated age     Orientation: : Oriented to Self, Oriented to Place, Oriented to Situation Alcohol / Substance Use: Not Applicable Psych Involvement: No (comment)  Admission diagnosis:  Atypical chest pain [R07.89] CKD (chronic kidney disease) stage 5, GFR less than 15 ml/min (HCC) [N18.5] Elevated troponin [R79.89] Chest pain [R07.9] Chest pain, unspecified type [R07.9] Patient Active Problem List   Diagnosis Date Noted  . Multifocal pneumonia 11/07/2018  . Non-ST elevation (NSTEMI) myocardial infarction ([place]) 11/06/2018  . Acute hypoxemic respiratory failure ([place]) 10/17/2017  . Dyspnea 10/17/2017  . Polyp of transverse colon 10/08/2017  . Anemia 08/07/2017  . GI bleed 07/30/2017  . Hyperkalemia 07/30/2017  . Essential hypertension, benign 12/13/2016  . Hypothyroidism 12/13/2016  . Iron deficiency anemia 11/08/2016  . Normocytic anemia 10/26/2016  . Elevated troponin   . Aortic atherosclerosis ([place]) 10/25/2016  . Symptomatic anemia 10/04/2016  . History of non-ST elevation myocardial infarction (NSTEMI) 10/04/2016  . Mixed hyperlipidemia 10/04/2016  . Neuropathy 09/20/2016  . Chronic  kidney disease, stage IV (severe) ([place]) 08/03/2015  . Hip fracture ([place]) 02/05/2015  . Diabetes mellitus with stage 4 chronic kidney disease ([place]) 02/05/2015  . HTN (hypertension) 02/05/2015  . Gout 02/05/2015  . Left humeral fracture 02/05/2015  . Hyperlipidemia associated with type 2 diabetes mellitus ([place]) 05/01/2013   PCP:  Doree Albee, MD Pharmacy:   [facility] [address] [place] [address] Phone: [phone] Fax: [phone]  [place] [address], [place] - [place] [address] [place] [address] [place] [address] Phone: [phone] Fax: [facility] [place], [place] [address] Phone: [phone] Fax: [phone]     Social Determinants of Health (SDOH) Interventions    Readmission Risk Interventions No flowsheet data found.

## 2018-11-07 NOTE — Progress Notes (Signed)
Placed patient on 12L HFNC, patient maintaining SATs 96-97%. RT will continue to monitor.

## 2018-11-07 NOTE — Progress Notes (Signed)
MD to floor to assess patient.   MD orders carried out.  Patient sent to CT scan and will be transferred to stepdown per MD order.

## 2018-11-07 NOTE — Progress Notes (Signed)
Pharmacy Antibiotic Note  [name] is a 78 y.o. female admitted on 11/06/2018 with pneumonia.  Pharmacy has been consulted for Vancomycin and Cefepime dosing.  Plan: Vancomycin 1500 mg IV x 1 dose. Vancomycin 750 mg IV post HD (monitor patient for dialysis daily) Cefepime 1000 mg IV every 24 hours- time adjusted for after dialysis. Monitor labs, c/s, and vanco levels as indicated.  Height: 5\' 4"  (162.6 cm) Weight: 174 lb (78.9 kg) IBW/kg (Calculated) : 54.7  Temp (24hrs), Avg:98.6 F (37 C), Min:98.4 F (36.9 C), Max:98.7 F (37.1 C)  Recent Labs  Lab 11/06/18 0926 11/07/18 0453 11/07/18 0738 11/07/18 1125  WBC 10.7*  --   --   --   CREATININE 6.82* 7.10*  --   --   LATICACIDVEN  --   --  0.7 0.9    Estimated Creatinine Clearance: 6.6 mL/min (A) (by C-G formula based on SCr of 7.1 mg/dL (H)).    Allergies  Allergen Reactions  . Insulin Glargine Swelling    "Makes me swell like a balloon all over", including face, but without any respiratory distress or rashes. Associated with weight gain.  . Statins Other (See Comments)    "I've tried them all; my muscle aches were so bad I couldn't walk".    Antimicrobials this admission: Vanco 6/12 >>  Cefepime 6/12 >>   Dose adjustments this admission: Vanco/Cefepime  Microbiology results: 6/12 BCx: pending  6/12 Sputum: pending  6/12 MRSA PCR: pending  Thank you for allowing pharmacy to be a part of this patient's care.  Ramond Craver 11/07/2018 12:10 PM

## 2018-11-07 NOTE — Progress Notes (Signed)
CRITICAL VALUE ALERT  Critical Value:  Troponin 4.14  Date & Time Notied: 11/07/18 @ 1212  Provider Notified: Dr. Dyann Kief  Orders Received/Actions taken: Awaiting orders, see chart

## 2018-11-07 NOTE — Progress Notes (Signed)
Patient still c/o chest pain.  Medications given per MD order.  Oxygen saturation dropped to low 80s.  Patient placed on NRB, EKG obtained, and md will come to floor to assesss patient. Blood pressure and pulse remain stable.  Patient still alert and oriented and able to answer questions appropriately at this time.

## 2018-11-07 NOTE — Progress Notes (Signed)
Patient ID: [name], female   DOB: 03-30-1941, 78 y.o.   MRN: [id number]

## 2018-11-07 NOTE — Progress Notes (Signed)
Patient c/o chest pain.  Bp 129/57, pulse 91.  Oxygen on patient at 1 liter, increased to 2 liters.  Patient states pain is middle of chest.  Nitro administered per md order.

## 2018-11-07 NOTE — Progress Notes (Signed)
Patient transferred to stepdown.  Patient belongings and cell phone moved downstairs with patient.  Asked if patient would like to contact family/significant other, patient denied contacting family at this time.

## 2018-11-07 NOTE — Progress Notes (Signed)
PROGRESS NOTE    [name]  TDV:[id number] DOB: 1940-09-15 DOA: 11/06/2018 PCP: Doree Albee, MD     Brief Narrative:  78 y.o. female with medical history significant for progressive renal insufficiency, gout, NSTEMI, DM 2, hypertension, hypothyroidism, who presented to the ED with complaints of Central chest pain that started this morning 4 AM in the morning while she was trying to sleep.  Chest pain is nonradiating and associated with difficulty breathing.  No associated nausea or vomiting.  Chest pain persisted so she came to the ED.  No known aggravating or relieving factors.  Patient found with acute on chronic renal failure, acute on chronic diastolic heart failure, worsening shortness of breath and end STEMI.  She has been admitted for further evaluation and management.   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 -Increased shortness of breath and imaging studies suggesting vascular congestion and interstitial edema. -Given patient's uremia nephrology service has been contacted and the plan is for initiation on hemodialysis. -Follow clinical response  2-chest pain/NSTEMI -Patient with underlying history of coronary artery disease medically manage due to chronic renal failure and inability for heart cath. -Pain improve/resolved with the use of nitroglycerin -Troponin has peaked at 4.4 -Cardiology service has been consulted for further recommendation -Will continue the use of beta-blocker, Plavix and aspirin.  3-hypothyroidism -Continue Armour  4-gastroesophageal reflux disease -Continue PPI.  5-type 2 diabetes mellitus -Continue sliding scale insulin and follow CBGs.  6-essential hypertension -Continue current medication -Blood pressure stable.  7-recent pneumonia -Patient completed treatment as an outpatient has  been repeat imaging studies suggesting still ongoing multifocal infiltrates -No fever, no WBCs and no complaints of coughing spells -Will discontinue antibiotics and focus on volume control and relieve her vascular congestion. -Follow clinical response.  8-acute on chronic diastolic heart failure -Follow daily weights -Volume stabilization with dialysis initiation -Follow low-sodium diet -Strict I's and O's. -Follow clinical response.  9-hyperkalemia -Kayexalate given at time of admission -Further electrolytes and stabilization to be accomplished with hemodialysis -Follow nephrology recommendations.      DVT prophylaxis: Heparin Code Status: Full code Family Communication: No family at bedside. Disposition Plan: Remains inpatient, for now continue monitoring in a stepdown unit.  Following nephrology recommendations patient will be initiated on hemodialysis to assist with electrolytes stability, control of her uremia and also volume status.  Will follow cardiology recommendation.  Consultants:   Cardiology  Nephrology  Procedures:   See below for x-ray reports.  Antimicrobials:  Anti-infectives (From admission, onward)   Start     Dose/Rate Route Frequency Ordered Stop   11/07/18 2200  ceFEPIme (MAXIPIME) 1 g in sodium chloride 0.9 % 100 mL IVPB  Status:  Discontinued     1 g 200 mL/hr over 30 Minutes Intravenous Every 24 hours 11/07/18 1209 11/07/18 1227   11/07/18 1800  vancomycin (VANCOCIN) IVPB 750 mg/150 ml premix  Status:  Discontinued     750 mg 150 mL/hr over 60 Minutes Intravenous  Once 11/07/18 1214 11/07/18 1227   11/07/18 0745  vancomycin (VANCOCIN) 1,500 mg in sodium chloride 0.9 % 500 mL IVPB     1,500 mg 250 mL/hr over 120 Minutes Intravenous  Once 11/07/18 0735 11/07/18 1044   11/07/18 0730  ceFEPIme (MAXIPIME) 1 g in sodium chloride 0.9 % 100 mL IVPB  Status:  Discontinued     1 g  200 mL/hr over 30 Minutes Intravenous Every 24 hours 11/07/18 0726  11/07/18 1209   11/07/18 0730  vancomycin (VANCOCIN) IVPB 1000 mg/200 mL premix  Status:  Discontinued     1,000 mg 200 mL/hr over 60 Minutes Intravenous  Once 11/07/18 0728 11/07/18 0735   11/06/18 2200  sulfamethoxazole-trimethoprim (BACTRIM) 400-80 MG per tablet 1 tablet  Status:  Discontinued     1 tablet Oral Daily at 10 pm 11/06/18 2106 11/07/18 1227       Subjective: Afebrile, no coughing, currently chest pain-free.  Using nasal cannula supplementation (high flow nasal cannula) with improvement overall in her breathing.  Positive asterixis.  Decreased urine output.  Objective: Vitals:   11/07/18 1755 11/07/18 1800 11/07/18 1815 11/07/18 1830  BP: (!) 159/49 (!) 163/48 (!) 159/56 (!) 158/49  Pulse: 68 67 70 68  Resp: 18 15 15 14   Temp: 99 F (37.2 C)     TempSrc: Oral     SpO2: 94%     Weight:      Height:        Intake/Output Summary (Last 24 hours) at 11/07/2018 1838 Last data filed at 11/07/2018 1300 Gross per 24 hour  Intake 778.17 ml  Output 4 ml  Net 774.17 ml   Filed Weights   11/06/18 0834  Weight: 78.9 kg    Examination: General exam: Alert, awake, oriented x 3; breathing easier on high flow nasal cannula currently.  Currently denying chest pain or palpitations.  Positive asterixis on examination. Respiratory system: Decreased breath sounds at the bases and fine crackles on auscultation; no wheezing, positive rhonchi.  No using accessory muscles.   Cardiovascular system: Rate control, no rubs, no gallops; no JVD on exam. Gastrointestinal system: Abdomen is nondistended, soft and nontender. No organomegaly or masses felt. Normal bowel sounds heard. Central nervous system: Alert and oriented. No focal neurological deficits. Extremities: No cyanosis or clubbing; no lower extremity edema.  Positive thrill appreciated on her right upper extremity aVF. Skin: No rashes, no petechiae.  Patient with a 3 x 3 cm healing dry wound with callus on the sole of her right  lower extremity.  No drainage, no surrounding erythema, no pain. Psychiatry: Judgement and insight appear normal. Mood & affect appropriate.     Data Reviewed: I have personally reviewed following labs and imaging studies  CBC: Recent Labs  Lab 11/06/18 0926  WBC 10.7*  NEUTROABS 9.0*  HGB 9.0*  HCT 29.0*  MCV 98.6  PLT 295   Basic Metabolic Panel: Recent Labs  Lab 11/06/18 0926 11/07/18 0453  NA 139 139  K 5.4* 5.1  CL 104 107  CO2 17* 18*  GLUCOSE 108* 112*  BUN 100* 103*  CREATININE 6.82* 7.10*  CALCIUM 8.5* 8.1*   GFR: Estimated Creatinine Clearance: 6.6 mL/min (A) (by C-G formula based on SCr of 7.1 mg/dL (H)).  Cardiac Enzymes: Recent Labs  Lab 11/06/18 1253 11/06/18 1848 11/07/18 0033 11/07/18 0453 11/07/18 1125  TROPONINI 0.04* 0.05* 0.05* 0.05* 4.14*   CBG: Recent Labs  Lab 11/07/18 0006 11/07/18 0417 11/07/18 0738 11/07/18 1145 11/07/18 1609  GLUCAP 93 107* 118* 145* 111*   Thyroid Function Tests: Recent Labs    11/06/18 0944  TSH 0.326*   Urine analysis:    Component Value Date/Time   COLORURINE YELLOW 03/09/2017 0801   APPEARANCEUR CLEAR 03/09/2017 0801   LABSPEC 1.013 03/09/2017 0801   PHURINE 5.0 03/09/2017 0801   GLUCOSEU 50 (A) 03/09/2017 0801   HGBUR NEGATIVE 03/09/2017  0801   BILIRUBINUR NEGATIVE 03/09/2017 0801   KETONESUR NEGATIVE 03/09/2017 0801   PROTEINUR 30 (A) 03/09/2017 0801   UROBILINOGEN 0.2 02/08/2015 1615   NITRITE NEGATIVE 03/09/2017 0801   LEUKOCYTESUR NEGATIVE 03/09/2017 0801    Recent Results (from the past 240 hour(s))  SARS Coronavirus 2 (CEPHEID - Performed in [facility] hospital lab), Hosp Order     Status: None   Collection Time: 11/06/18  1:30 PM   Specimen: Nasopharyngeal Swab  Result Value Ref Range Status   SARS Coronavirus 2 NEGATIVE NEGATIVE Final    Comment: (NOTE) If result is NEGATIVE SARS-CoV-2 target nucleic acids are NOT DETECTED. The SARS-CoV-2 RNA is generally detectable in  upper and lower  respiratory specimens during the acute phase of infection. The lowest  concentration of SARS-CoV-2 viral copies this assay can detect is 250  copies / mL. A negative result does not preclude SARS-CoV-2 infection  and should not be used as the sole basis for treatment or other  patient management decisions.  A negative result may occur with  improper specimen collection / handling, submission of specimen other  than nasopharyngeal swab, presence of viral mutation(s) within the  areas targeted by this assay, and inadequate number of viral copies  (<250 copies / mL). A negative result must be combined with clinical  observations, patient history, and epidemiological information. If result is POSITIVE SARS-CoV-2 target nucleic acids are DETECTED. The SARS-CoV-2 RNA is generally detectable in upper and lower  respiratory specimens dur ing the acute phase of infection.  Positive  results are indicative of active infection with SARS-CoV-2.  Clinical  correlation with patient history and other diagnostic information is  necessary to determine patient infection status.  Positive results do  not rule out bacterial infection or co-infection with other viruses. If result is PRESUMPTIVE POSTIVE SARS-CoV-2 nucleic acids MAY BE PRESENT.   A presumptive positive result was obtained on the submitted specimen  and confirmed on repeat testing.  While 2019 novel coronavirus  (SARS-CoV-2) nucleic acids may be present in the submitted sample  additional confirmatory testing may be necessary for epidemiological  and / or clinical management purposes  to differentiate between  SARS-CoV-2 and other Sarbecovirus currently known to infect humans.  If clinically indicated additional testing with an alternate test  methodology [phone]) is advised. The SARS-CoV-2 RNA is generally  detectable in upper and lower respiratory sp ecimens during the acute  phase of infection. The expected result is  Negative. Fact Sheet for Patients:  StrictlyIdeas.no Fact Sheet for Healthcare Providers: BankingDealers.co.za This test is not yet approved or cleared by the [place] FDA and has been authorized for detection and/or diagnosis of SARS-CoV-2 by FDA under an Emergency Use Authorization (EUA).  This EUA will remain in effect (meaning this test can be used) for the duration of the COVID-19 declaration under Section 564(b)(1) of the Act, 21 U.S.C. section 360bbb-3(b)(1), unless the authorization is terminated or revoked sooner. Performed at [facility], [address] [place], [place] [address]   MRSA PCR Screening     Status: None   Collection Time: 11/07/18  6:09 AM   Specimen: Nasal Mucosa; Nasopharyngeal  Result Value Ref Range Status   MRSA by PCR NEGATIVE NEGATIVE Final    Comment:        The GeneXpert MRSA Assay (FDA approved for NASAL specimens only), is one component of a comprehensive MRSA colonization surveillance program. It is not intended to diagnose MRSA infection nor to guide or  monitor treatment for MRSA infections. Performed at [facility], [address] [place], [place] [address]   Culture, blood (routine x 2)     Status: None (Preliminary result)   Collection Time: 11/07/18  7:38 AM   Specimen: Right Antecubital; Blood  Result Value Ref Range Status   Specimen Description   Final    RIGHT ANTECUBITAL BOTTLES DRAWN AEROBIC AND ANAEROBIC   Special Requests   Final    Blood Culture adequate volume Performed at [facility], [address] [place], [place] [address]    Culture PENDING  Incomplete   Report Status PENDING  Incomplete  Culture, blood (routine x 2)     Status: None (Preliminary result)   Collection Time: 11/07/18  7:38 AM   Specimen: BLOOD LEFT FOREARM  Result Value Ref Range Status   Specimen Description   Final    BLOOD LEFT FOREARM BOTTLES DRAWN AEROBIC AND ANAEROBIC   Special Requests    Final    Blood Culture adequate volume Performed at [facility], [address], [place], [place] [address]    Culture PENDING  Incomplete   Report Status PENDING  Incomplete     Radiology Studies: Ct Chest Wo Contrast  Result Date: 11/07/2018 CLINICAL DATA:  Evaluate chest wall pain EXAM: CT CHEST WITHOUT CONTRAST TECHNIQUE: Multidetector CT imaging of the chest was performed following the standard protocol without IV contrast. COMPARISON:  CT chest 09/17/2016 FINDINGS: Cardiovascular: Cardiac enlargement. No pericardial effusion. Aortic atherosclerosis. Lad, left circumflex and RCA coronary artery calcifications. Mediastinum/Nodes: Normal appearance of the thyroid gland. The trachea appears patent and is midline. Normal appearance of the esophagus. Prominent mediastinal lymph nodes measuring up to 1.2 cm within the right paratracheal chain noted. No adenopathy. The hilar structures are suboptimally evaluated due to lack of IV contrast. Lungs/Pleura: There are small bilateral pleural effusions. Interlobular septal thickening is identified bilaterally compatible with interstitial edema. Airspace consolidation and subsegmental atelectasis noted in both lower lobes, lingula and posterolateral left upper lobe. Upper Abdomen: No acute abnormality. Musculoskeletal: No chest wall mass or suspicious bone lesions identified. IMPRESSION: 1. Bilateral multifocal airspace consolidation and atelectasis. Findings consistent with multifocal pneumonia. 2. Cardiac enlargement, 3 vessel coronary artery calcifications, pulmonary edema, and bilateral pleural effusions compatible with CHF. 3.  Aortic Atherosclerosis (ICD10-I70.0). Electronically Signed   By: Kerby Moors M.D.   On: 11/07/2018 08:05   Nm Pulmonary Perfusion  Result Date: 11/06/2018 CLINICAL DATA:  Chest pain and shortness of breath for 1 day, elevated D-dimer, chronic kidney disease with elevated creatinine EXAM: NUCLEAR MEDICINE PERFUSION LUNG SCAN  TECHNIQUE: Perfusion images were obtained in multiple projections after intravenous injection of radiopharmaceutical. Ventilation scans intentionally deferred if perfusion scan and chest x-ray adequate for interpretation during COVID 19 epidemic. RADIOPHARMACEUTICALS:  1.5 mCi Tc-59m MAA IV COMPARISON:  09/27/2018 Correlation: Chest radiograph 11/06/2018 FINDINGS: Normal perfusion images of both lungs. No segmental or subsegmental perfusion defects. Ventilation exam not performed. IMPRESSION: Normal perfusion lung scan, unchanged. Electronically Signed   By: Lavonia Dana M.D.   On: 11/06/2018 16:27   Dg Chest Port 1 View  Result Date: 11/06/2018 CLINICAL DATA:  Chest pain EXAM: PORTABLE CHEST 1 VIEW COMPARISON:  09/26/2018 FINDINGS: Cardiomegaly. No overt edema. Bibasilar atelectasis noted. No visible significant effusions or acute bony abnormality. IMPRESSION: Cardiomegaly, bibasilar atelectasis.  No overt edema. Electronically Signed   By: Rolm Baptise M.D.   On: 11/06/2018 09:57    Scheduled Meds: . aspirin EC  81 mg Oral Daily  .  calcium acetate  667 mg Oral TID WC  . Chlorhexidine Gluconate Cloth  6 each Topical Q0600  . clopidogrel  75 mg Oral Daily  . docusate sodium  100 mg Oral Q12H  . febuxostat  40 mg Oral Daily  . heparin  5,000 Units Subcutaneous Q8H  . hydrALAZINE  37.5 mg Oral BID  . insulin aspart  0-9 Units Subcutaneous Q4H  . mouth rinse  15 mL Mouth Rinse BID  . metoprolol tartrate  12.5 mg Oral BID  . pantoprazole  40 mg Oral QAC breakfast  . sodium bicarbonate  650 mg Oral BID  . thyroid  120 mg Oral Q0600   Continuous Infusions: . sodium chloride    . sodium chloride       LOS: 0 days    Time spent: 35 minutes. Greater than 50% of this time was spent in direct contact with the patient, coordinating care and discussing relevant ongoing clinical issues, including discussion about increased vascular congestion and concern for acute on chronic diastolic heart failure;  also with worsening renal function and the need of hemodialysis to further stabilize her symptoms.  Patient with elevated BUN and most likely having high component of uremia as part of presenting symptoms.  We discussed elevated troponin level and the fact that her chest discomfort got relief with the use of nitroglycerin.  Cardiology service has been consulted and will follow their recommendations.  Renal service is on board.     Barton Dubois, MD [facility] Pager [phone]   11/07/2018, 6:38 PM

## 2018-11-07 NOTE — H&P (Signed)
Night shift telemetry coverage note.  The patient was seen due to having pleuritic chest pain that did not respond to sublingual nitroglycerin and partially responded to 4 mg of morphine IVP.  Subsequently, patient stated that her pain was better, but she became hypoxic in the mid to high 80s despite having 6 LPM of O2 via nasal cannula.  The rest of the vital signs were stable with a pulse of 89, respirations 22, blood pressure 136/56 mmHg.   General: Looks acutely ill. HEENT PERRLA, oral mucosa is mildly dry. Neck: Supple, no JVD. Lungs decreased breath sounds bilaterally with rales on bases.  No wheezing/rhonchi. CV: S1-S2, RRR. Abdomen: Soft, nontender. Extremities: No edema or cyanosis. Neuro: Grossly nonfocal.  Hypoxia Pleuritic chest pain NRB oxygen was placed and the patient was sent for CT scan. EKG, troponin, BMP, CBC with differential and lactic acid. Pending final report, CT shows bilateral consolidations R>>L. Blood cultures x2, followed by HCAP antibiotic coverage. Continue supplemental oxygen and BiPAP as needed.  Tennis Must, MD.  This document was prepared using Dragon voice recognition software and may contain some unintended transcription errors.  About 35 minutes of critical care time were spent during this emergent event.

## 2018-11-08 LAB — CBC WITH DIFFERENTIAL/PLATELET
Abs Immature Granulocytes: 0.04 10*3/uL (ref 0.00–0.07)
Basophils Absolute: 0 10*3/uL (ref 0.0–0.1)
Basophils Relative: 0 %
Eosinophils Absolute: 0 10*3/uL (ref 0.0–0.5)
Eosinophils Relative: 0 %
HCT: 27.3 % — ABNORMAL LOW (ref 36.0–46.0)
Hemoglobin: 8.3 g/dL — ABNORMAL LOW (ref 12.0–15.0)
Immature Granulocytes: 1 %
Lymphocytes Relative: 10 %
Lymphs Abs: 0.9 10*3/uL (ref 0.7–4.0)
MCH: 30.3 pg (ref 26.0–34.0)
MCHC: 30.4 g/dL (ref 30.0–36.0)
MCV: 99.6 fL (ref 80.0–100.0)
Monocytes Absolute: 0.7 10*3/uL (ref 0.1–1.0)
Monocytes Relative: 8 %
Neutro Abs: 7.2 10*3/uL (ref 1.7–7.7)
Neutrophils Relative %: 81 %
Platelets: 182 10*3/uL (ref 150–400)
RBC: 2.74 MIL/uL — ABNORMAL LOW (ref 3.87–5.11)
RDW: 14.8 % (ref 11.5–15.5)
WBC: 8.9 10*3/uL (ref 4.0–10.5)
nRBC: 0 % (ref 0.0–0.2)

## 2018-11-08 LAB — RENAL FUNCTION PANEL
Albumin: 3 g/dL — ABNORMAL LOW (ref 3.5–5.0)
Anion gap: 14 (ref 5–15)
BUN: 64 mg/dL — ABNORMAL HIGH (ref 8–23)
CO2: 24 mmol/L (ref 22–32)
Calcium: 8.1 mg/dL — ABNORMAL LOW (ref 8.9–10.3)
Chloride: 101 mmol/L (ref 98–111)
Creatinine, Ser: 4.64 mg/dL — ABNORMAL HIGH (ref 0.44–1.00)
GFR calc Af Amer: 10 mL/min — ABNORMAL LOW (ref >60)
GFR calc non Af Amer: 8 mL/min — ABNORMAL LOW (ref >60)
Glucose, Bld: 97 mg/dL (ref 70–99)
Phosphorus: 5.6 mg/dL — ABNORMAL HIGH (ref 2.5–4.6)
Potassium: 4.6 mmol/L (ref 3.5–5.1)
Sodium: 139 mmol/L (ref 135–145)

## 2018-11-08 LAB — GLUCOSE, CAPILLARY
Glucose-Capillary: 100 mg/dL — ABNORMAL HIGH (ref 70–99)
Glucose-Capillary: 105 mg/dL — ABNORMAL HIGH (ref 70–99)
Glucose-Capillary: 112 mg/dL — ABNORMAL HIGH (ref 70–99)
Glucose-Capillary: 119 mg/dL — ABNORMAL HIGH (ref 70–99)
Glucose-Capillary: 107 mg/dL — ABNORMAL HIGH (ref 70–99)

## 2018-11-08 LAB — HEPATITIS B SURFACE ANTIBODY, QUANTITATIVE: Hep B S AB Quant (Post): 5.3 m[IU]/mL — ABNORMAL LOW (ref >9.9)

## 2018-11-08 LAB — HEPATITIS B CORE ANTIBODY, TOTAL: Hep B Core Total Ab: NEGATIVE

## 2018-11-08 LAB — HEPATITIS B SURFACE ANTIGEN: Hepatitis B Surface Ag: NEGATIVE

## 2018-11-08 MED ORDER — INSULIN ASPART 100 UNIT/ML ~~LOC~~ SOLN
0.0000 [IU] | Freq: Every day | SUBCUTANEOUS | Status: DC
  Administered 2018-11-14: 2 [IU] via SUBCUTANEOUS

## 2018-11-08 MED ORDER — CIPROFLOXACIN-DEXAMETHASONE 0.3-0.1 % OT SUSP
4.0000 [drp] | Freq: Two times a day (BID) | OTIC | Status: DC
  Administered 2018-11-08 – 2018-11-30 (×44): 4 [drp] via OTIC
  Filled 2018-11-08 (×4): qty 7.5

## 2018-11-08 MED ORDER — INSULIN ASPART 100 UNIT/ML ~~LOC~~ SOLN
0.0000 [IU] | Freq: Three times a day (TID) | SUBCUTANEOUS | Status: DC
  Administered 2018-11-09: 1 [IU] via SUBCUTANEOUS
  Administered 2018-11-09 – 2018-11-10 (×2): 2 [IU] via SUBCUTANEOUS
  Administered 2018-11-11: 1 [IU] via SUBCUTANEOUS
  Administered 2018-11-11: 2 [IU] via SUBCUTANEOUS
  Administered 2018-11-12: 1 [IU] via SUBCUTANEOUS
  Administered 2018-11-13 – 2018-11-15 (×4): 2 [IU] via SUBCUTANEOUS
  Administered 2018-11-16 (×2): 1 [IU] via SUBCUTANEOUS
  Administered 2018-11-17: 2 [IU] via SUBCUTANEOUS
  Administered 2018-11-17 – 2018-11-18 (×2): 1 [IU] via SUBCUTANEOUS
  Administered 2018-11-18 – 2018-11-19 (×2): 2 [IU] via SUBCUTANEOUS
  Administered 2018-11-19 (×2): 1 [IU] via SUBCUTANEOUS

## 2018-11-08 MED ORDER — GABAPENTIN 300 MG PO CAPS
300.0000 mg | ORAL_CAPSULE | Freq: Every day | ORAL | Status: DC
  Administered 2018-11-08 – 2018-11-19 (×12): 300 mg via ORAL
  Filled 2018-11-08 (×8): qty 1
  Filled 2018-11-08: qty 3
  Filled 2018-11-08 (×3): qty 1

## 2018-11-08 NOTE — Progress Notes (Signed)
PROGRESS NOTE    [name]  YDX:[id number] DOB: April 30, 1941 DOA: 11/06/2018 PCP: Doree Albee, MD     Brief Narrative:  78 y.o. female with medical history significant for progressive renal insufficiency, gout, NSTEMI, DM 2, hypertension, hypothyroidism, who presented to the ED with complaints of Central chest pain that started this morning 4 AM in the morning while she was trying to sleep.  Chest pain is nonradiating and associated with difficulty breathing.  No associated nausea or vomiting.  Chest pain persisted so she came to the ED.  No known aggravating or relieving factors.  Patient found with acute on chronic renal failure, acute on chronic diastolic heart failure, worsening shortness of breath and end STEMI.  She has been admitted for further evaluation and management.   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 -Increased shortness of breath and imaging studies suggesting vascular congestion and interstitial edema. -Patient tolerated well first dialysis session; plan is to most likely repeat another treatment today.  Will follow nephrology recommendations. -Follow clinical response  2-chest pain/NSTEMI -Patient with underlying history of coronary artery disease medically manage due to chronic renal failure and inability for heart cath. -Pain improve/resolved with the use of nitroglycerin -Troponin has peaked at 4.4 -Cardiology service has been consulted for further recommendation -Will continue the use of beta-blocker, Plavix and aspirin.  3-hypothyroidism -Continue Armour  4-gastroesophageal reflux disease -Continue PPI.  5-type 2 diabetes mellitus -Continue sliding scale insulin and follow CBGs.  6-essential hypertension -Continue current medication -Blood pressure stable and well-controlled.  7-concerns for  pneumonia -No fever, no WBCs and no complaints of coughing spells currently -Will continue monitoring patient off antibiotics -Continue to focus on volume control and relieve her vascular congestion. -Follow clinical response and repeat chest x-ray at to assess infiltrates.Marland Kitchen  8-acute on chronic diastolic heart failure -Follow daily weights -Volume stabilization with dialysis initiation -Follow low-sodium diet -Strict I's and O's. -Follow clinical response.  9-hyperkalemia -Kayexalate given at time of admission -Further electrolytes and stabilization to be accomplished with hemodialysis -Follow nephrology recommendations. -Potassium within normal limits at this time.   DVT prophylaxis: Heparin Code Status: Full code Family Communication: No family at bedside. Disposition Plan: Remains inpatient, for now continue monitoring in a stepdown unit (given high flow nasal cannula needs).  Following nephrology recommendations patient will be initiated on hemodialysis to assist with electrolytes stability, control of her uremia and also volume status.  Will follow cardiology recommendation.  Consultants:   Cardiology  Nephrology  Procedures:   See below for x-ray reports.  Antimicrobials:  Anti-infectives (From admission, onward)   Start     Dose/Rate Route Frequency Ordered Stop   11/07/18 2200  ceFEPIme (MAXIPIME) 1 g in sodium chloride 0.9 % 100 mL IVPB  Status:  Discontinued     1 g 200 mL/hr over 30 Minutes Intravenous Every 24 hours 11/07/18 1209 11/07/18 1227   11/07/18 1800  vancomycin (VANCOCIN) IVPB 750 mg/150 ml premix  Status:  Discontinued     750 mg 150 mL/hr over 60 Minutes Intravenous  Once 11/07/18 1214 11/07/18 1227   11/07/18 0745  vancomycin (VANCOCIN) 1,500 mg in sodium chloride 0.9 % 500 mL IVPB     1,500 mg 250 mL/hr over 120 Minutes Intravenous  Once 11/07/18 0735 11/07/18 1044   11/07/18 0730  ceFEPIme (MAXIPIME) 1 g in sodium chloride 0.9 %  100 mL IVPB   Status:  Discontinued     1 g 200 mL/hr over 30 Minutes Intravenous Every 24 hours 11/07/18 0726 11/07/18 1209   11/07/18 0730  vancomycin (VANCOCIN) IVPB 1000 mg/200 mL premix  Status:  Discontinued     1,000 mg 200 mL/hr over 60 Minutes Intravenous  Once 11/07/18 0728 11/07/18 0735   11/06/18 2200  sulfamethoxazole-trimethoprim (BACTRIM) 400-80 MG per tablet 1 tablet  Status:  Discontinued     1 tablet Oral Daily at 10 pm 11/06/18 2106 11/07/18 1227       Subjective: No fever, no coughing, no chest pain.  Still using high flow nasal cannula supplementation (10-12 L); reports improvement in appetite and overall feeling better after first dialysis yesterday.  Objective: Vitals:   11/08/18 1245 11/08/18 1300 11/08/18 1315 11/08/18 1330  BP: (!) 140/57 (!) 143/58 (!) 147/60 (!) 154/62  Pulse: 68 72 70 72  Resp: 17 16 16 14   Temp:      TempSrc:      SpO2:      Weight:      Height:        Intake/Output Summary (Last 24 hours) at 11/08/2018 1332 Last data filed at 11/07/2018 2055 Gross per 24 hour  Intake 0 ml  Output 556 ml  Net -556 ml   Filed Weights   11/06/18 0834 11/08/18 1030  Weight: 78.9 kg 80.4 kg    Examination: General exam: Alert, awake, oriented x 3, breathing a lot easier and feeling better.  Reports having improvement in her appetite.  Patient is afebrile.  Still using 10-12 L nasal cannula supplementation. Respiratory system: Decreased breath sounds at the bases, no wheezing, no using accessory muscles.   Cardiovascular system: RRR. No murmurs, rubs, gallops. Gastrointestinal system: Abdomen is nondistended, soft and nontender. No organomegaly or masses felt. Normal bowel sounds heard. Central nervous system: Alert and oriented. No focal neurological deficits. Extremities: No cyanosis or clubbing; no lower extremity edema appreciated.  Positive thrill appreciated on the right upper extremity aVF. Skin: No rashes, no petechiae; 3 x 3 cm healing dry wound with  callus on the sole of her right lower extremity without signs of superimposed infection currently. Psychiatry: Judgement and insight appear normal. Mood & affect appropriate.    Data Reviewed: I have personally reviewed following labs and imaging studies  CBC: Recent Labs  Lab 11/06/18 0926 11/08/18 0433  WBC 10.7* 8.9  NEUTROABS 9.0* 7.2  HGB 9.0* 8.3*  HCT 29.0* 27.3*  MCV 98.6 99.6  PLT 225 993   Basic Metabolic Panel: Recent Labs  Lab 11/06/18 0926 11/07/18 0453 11/08/18 0433  NA 139 139 139  K 5.4* 5.1 4.6  CL 104 107 101  CO2 17* 18* 24  GLUCOSE 108* 112* 97  BUN 100* 103* 64*  CREATININE 6.82* 7.10* 4.64*  CALCIUM 8.5* 8.1* 8.1*  PHOS  --   --  5.6*   GFR: Estimated Creatinine Clearance: 10.3 mL/min (A) (by C-G formula based on SCr of 4.64 mg/dL (H)).  Cardiac Enzymes: Recent Labs  Lab 11/06/18 1253 11/06/18 1848 11/07/18 0033 11/07/18 0453 11/07/18 1125  TROPONINI 0.04* 0.05* 0.05* 0.05* 4.14*   CBG: Recent Labs  Lab 11/07/18 2009 11/07/18 2358 11/08/18 0415 11/08/18 0817 11/08/18 1143  GLUCAP 94 116* 100* 105* 112*   Thyroid Function Tests: Recent Labs    11/06/18 0944  TSH 0.326*   Urine analysis:    Component Value Date/Time   COLORURINE YELLOW 03/09/2017 0801  APPEARANCEUR CLEAR 03/09/2017 0801   LABSPEC 1.013 03/09/2017 0801   PHURINE 5.0 03/09/2017 0801   GLUCOSEU 50 (A) 03/09/2017 0801   HGBUR NEGATIVE 03/09/2017 0801   BILIRUBINUR NEGATIVE 03/09/2017 0801   KETONESUR NEGATIVE 03/09/2017 0801   PROTEINUR 30 (A) 03/09/2017 0801   UROBILINOGEN 0.2 02/08/2015 1615   NITRITE NEGATIVE 03/09/2017 0801   LEUKOCYTESUR NEGATIVE 03/09/2017 0801    Recent Results (from the past 240 hour(s))  SARS Coronavirus 2 (CEPHEID - Performed in [place] hospital lab), Hosp Order     Status: None   Collection Time: 11/06/18  1:30 PM   Specimen: Nasopharyngeal Swab  Result Value Ref Range Status   SARS Coronavirus 2 NEGATIVE NEGATIVE  Final    Comment: (NOTE) If result is NEGATIVE SARS-CoV-2 target nucleic acids are NOT DETECTED. The SARS-CoV-2 RNA is generally detectable in upper and lower  respiratory specimens during the acute phase of infection. The lowest  concentration of SARS-CoV-2 viral copies this assay can detect is 250  copies / mL. A negative result does not preclude SARS-CoV-2 infection  and should not be used as the sole basis for treatment or other  patient management decisions.  A negative result may occur with  improper specimen collection / handling, submission of specimen other  than nasopharyngeal swab, presence of viral mutation(s) within the  areas targeted by this assay, and inadequate number of viral copies  (<250 copies / mL). A negative result must be combined with clinical  observations, patient history, and epidemiological information. If result is POSITIVE SARS-CoV-2 target nucleic acids are DETECTED. The SARS-CoV-2 RNA is generally detectable in upper and lower  respiratory specimens dur ing the acute phase of infection.  Positive  results are indicative of active infection with SARS-CoV-2.  Clinical  correlation with patient history and other diagnostic information is  necessary to determine patient infection status.  Positive results do  not rule out bacterial infection or co-infection with other viruses. If result is PRESUMPTIVE POSTIVE SARS-CoV-2 nucleic acids MAY BE PRESENT.   A presumptive positive result was obtained on the submitted specimen  and confirmed on repeat testing.  While 2019 novel coronavirus  (SARS-CoV-2) nucleic acids may be present in the submitted sample  additional confirmatory testing may be necessary for epidemiological  and / or clinical management purposes  to differentiate between  SARS-CoV-2 and other Sarbecovirus currently known to infect humans.  If clinically indicated additional testing with an alternate test  methodology [phone]) is advised. The  SARS-CoV-2 RNA is generally  detectable in upper and lower respiratory sp ecimens during the acute  phase of infection. The expected result is Negative. Fact Sheet for Patients:  StrictlyIdeas.no Fact Sheet for Healthcare Providers: BankingDealers.co.za This test is not yet approved or cleared by the [place] FDA and has been authorized for detection and/or diagnosis of SARS-CoV-2 by FDA under an Emergency Use Authorization (EUA).  This EUA will remain in effect (meaning this test can be used) for the duration of the COVID-19 declaration under Section 564(b)(1) of the Act, 21 U.S.C. section 360bbb-3(b)(1), unless the authorization is terminated or revoked sooner. Performed at [facility], [address], [place], [place] [address]   MRSA PCR Screening     Status: None   Collection Time: 11/07/18  6:09 AM   Specimen: Nasal Mucosa; Nasopharyngeal  Result Value Ref Range Status   MRSA by PCR NEGATIVE NEGATIVE Final    Comment:        The GeneXpert MRSA Assay (  FDA approved for NASAL specimens only), is one component of a comprehensive MRSA colonization surveillance program. It is not intended to diagnose MRSA infection nor to guide or monitor treatment for MRSA infections. Performed at [facility], [address] [place], [place] [address]   Culture, blood (routine x 2)     Status: None (Preliminary result)   Collection Time: 11/07/18  7:38 AM   Specimen: Right Antecubital; Blood  Result Value Ref Range Status   Specimen Description   Final    RIGHT ANTECUBITAL BOTTLES DRAWN AEROBIC AND ANAEROBIC   Special Requests Blood Culture adequate volume  Final   Culture   Final    NO GROWTH 1 DAY Performed at [facility], [address], [place], [place] [address]    Report Status PENDING  Incomplete  Culture, blood (routine x 2)     Status: None (Preliminary result)   Collection Time: 11/07/18  7:38 AM   Specimen: BLOOD LEFT  FOREARM  Result Value Ref Range Status   Specimen Description   Final    BLOOD LEFT FOREARM BOTTLES DRAWN AEROBIC AND ANAEROBIC   Special Requests Blood Culture adequate volume  Final   Culture   Final    NO GROWTH 1 DAY Performed at [facility], [address], [place], [place] [address]    Report Status PENDING  Incomplete     Radiology Studies: Ct Chest Wo Contrast  Result Date: 11/07/2018 CLINICAL DATA:  Evaluate chest wall pain EXAM: CT CHEST WITHOUT CONTRAST TECHNIQUE: Multidetector CT imaging of the chest was performed following the standard protocol without IV contrast. COMPARISON:  CT chest 09/17/2016 FINDINGS: Cardiovascular: Cardiac enlargement. No pericardial effusion. Aortic atherosclerosis. Lad, left circumflex and RCA coronary artery calcifications. Mediastinum/Nodes: Normal appearance of the thyroid gland. The trachea appears patent and is midline. Normal appearance of the esophagus. Prominent mediastinal lymph nodes measuring up to 1.2 cm within the right paratracheal chain noted. No adenopathy. The hilar structures are suboptimally evaluated due to lack of IV contrast. Lungs/Pleura: There are small bilateral pleural effusions. Interlobular septal thickening is identified bilaterally compatible with interstitial edema. Airspace consolidation and subsegmental atelectasis noted in both lower lobes, lingula and posterolateral left upper lobe. Upper Abdomen: No acute abnormality. Musculoskeletal: No chest wall mass or suspicious bone lesions identified. IMPRESSION: 1. Bilateral multifocal airspace consolidation and atelectasis. Findings consistent with multifocal pneumonia. 2. Cardiac enlargement, 3 vessel coronary artery calcifications, pulmonary edema, and bilateral pleural effusions compatible with CHF. 3.  Aortic Atherosclerosis (ICD10-I70.0). Electronically Signed   By: Kerby Moors M.D.   On: 11/07/2018 08:05   Nm Pulmonary Perfusion  Result Date: 11/06/2018 CLINICAL DATA:   Chest pain and shortness of breath for 1 day, elevated D-dimer, chronic kidney disease with elevated creatinine EXAM: NUCLEAR MEDICINE PERFUSION LUNG SCAN TECHNIQUE: Perfusion images were obtained in multiple projections after intravenous injection of radiopharmaceutical. Ventilation scans intentionally deferred if perfusion scan and chest x-ray adequate for interpretation during COVID 19 epidemic. RADIOPHARMACEUTICALS:  1.5 mCi Tc-75m MAA IV COMPARISON:  09/27/2018 Correlation: Chest radiograph 11/06/2018 FINDINGS: Normal perfusion images of both lungs. No segmental or subsegmental perfusion defects. Ventilation exam not performed. IMPRESSION: Normal perfusion lung scan, unchanged. Electronically Signed   By: Lavonia Dana M.D.   On: 11/06/2018 16:27    Scheduled Meds:  aspirin EC  81 mg Oral Daily   calcium acetate  667 mg Oral TID WC   Chlorhexidine Gluconate Cloth  6 each Topical Q0600   ciprofloxacin-dexamethasone  4 drop Left EAR  BID   clopidogrel  75 mg Oral Daily   docusate sodium  100 mg Oral Q12H   febuxostat  40 mg Oral Daily   heparin  5,000 Units Subcutaneous Q8H   hydrALAZINE  37.5 mg Oral BID   insulin aspart  0-9 Units Subcutaneous Q4H   mouth rinse  15 mL Mouth Rinse BID   metoprolol tartrate  12.5 mg Oral BID   pantoprazole  40 mg Oral QAC breakfast   sodium bicarbonate  650 mg Oral BID   thyroid  120 mg Oral Q0600   Continuous Infusions:  sodium chloride     sodium chloride       LOS: 1 day    Time spent: 35 minutes.  Greater than 50% of this time was spent in direct contact with the patient, coordinating care and discussing relevant ongoing clinical issues, including discussion about increased vascular congestion and concern for acute on chronic diastolic heart failure; also with worsening of her renal function and the need for hemodialysis initiation.  We discussed how could she failed after first dialysis treatment and the plan that we are looking for  another HD treatment today.  We have a long discussion about recommendation by cardiology and the possibility of heart cath after stabilization of her renal failure with dialysis initiation.    Barton Dubois, MD [facility] Pager [phone]   11/08/2018, 1:32 PM

## 2018-11-08 NOTE — Progress Notes (Signed)
Dr. Silas [place] paged- pts blood sugars order ac&hs but no order to change insulin from q4h.Insulin is still scheduled q4h. Dr. Curly Rim to see if he could change insulin order. Waiting for call back/orders. Will continue to monitor pt

## 2018-11-08 NOTE — Progress Notes (Signed)
[facility] Progress Note    Assessment/ Plan:   78 y.o. female PMH significant for CAD s/p NSTEMI, DM, HTN, gout, combined systolic and diastolic CHF, and CKD stage 5 not yet on HD who presented to [facility] with chest pain and worsening SOB.  Workup in the ED was notable for mildly elevated troponin, nonspecific t wave changes, VQ scan normal, but also noted to have BUN/Cr of 100/6.82.  She is normally followed by Dr. Lowanda Foster and was last seen 11/05/18 w/ rt AVF already placed (05/2018).  Acute incr in Cr related to Bactrim DS bid for the past 2 weeks for a foot ulcer.     1. AKI/CKD stage 5- presumably due to bactrim DS with advanced underlying CKD.  She was initiated on HD for uremia  and has had a eGFR of 10 or less for the past few months.   - HD #1 6/12  Seen on HD #2 2/2.5 bath  VSS UF goal 1L and appears that we will reach it 17ga needles in the RUA AVF  She wants a break so will plan next HD for Tues. Still being CLIP'd to be followed as an outpatient HD with  Dr. Lowanda Foster and Ramiro Harvest, PA-C and notify that she has started dialysis.  She will be seen by Dr. Justin Mend on Monday; she is stable and will not req HD tomorrow.  2. Chest pain- resolved.  W/u per primary 3. SOB- CT with pleural effusions and interstitial pulmonary infiltrates, concerning for CHF.  Will plan for UF with HD and recheck CXR and if improved infiltrates, makes pneumonia less likely 4. Anemia of CKD- will start ESA and replete iron stores. Will also check iron stores 5. SHPTH- iPTH 114.   Will need to check phos -> 5.6. Cont phoslo and will titrate as needed. 6. DM- per primary 7. HTN- stable 8. Foot ulcer- placed on bactrim DS by podiatry.  Stopped bactrim and follow. 9. CAD- stable  Subjective:   Feeling much better. Denies f/c/n/vcough. Mild nausea this am.   Objective:   BP (!) 143/58   Pulse 72   Temp 98 F (36.7 C) (Oral)   Resp 16   Ht _0  (1.626 m)   Wt 80.4 kg   SpO2 98%    BMI 30.42 kg/m   Intake/Output Summary (Last 24 hours) at 11/08/2018 1303 Last data filed at 11/07/2018 2055 Gross per 24 hour  Intake 0 ml  Output 556 ml  Net -556 ml   Weight change:   Physical Exam: General appearance: cooperative, fatigued and no distress Head: NCAT Resp: CTA B/L Cardio: RRR today GI: SNDNT +BS Extremities: no edema Access: RUE AVF +T/B   Imaging: Ct Chest Wo Contrast  Result Date: 11/07/2018 CLINICAL DATA:  Evaluate chest wall pain EXAM: CT CHEST WITHOUT CONTRAST TECHNIQUE: Multidetector CT imaging of the chest was performed following the standard protocol without IV contrast. COMPARISON:  CT chest 09/17/2016 FINDINGS: Cardiovascular: Cardiac enlargement. No pericardial effusion. Aortic atherosclerosis. Lad, left circumflex and RCA coronary artery calcifications. Mediastinum/Nodes: Normal appearance of the thyroid gland. The trachea appears patent and is midline. Normal appearance of the esophagus. Prominent mediastinal lymph nodes measuring up to 1.2 cm within the right paratracheal chain noted. No adenopathy. The hilar structures are suboptimally evaluated due to lack of IV contrast. Lungs/Pleura: There are small bilateral pleural effusions. Interlobular septal thickening is identified bilaterally compatible with interstitial edema. Airspace consolidation and subsegmental atelectasis noted in both lower lobes, lingula and  posterolateral left upper lobe. Upper Abdomen: No acute abnormality. Musculoskeletal: No chest wall mass or suspicious bone lesions identified. IMPRESSION: 1. Bilateral multifocal airspace consolidation and atelectasis. Findings consistent with multifocal pneumonia. 2. Cardiac enlargement, 3 vessel coronary artery calcifications, pulmonary edema, and bilateral pleural effusions compatible with CHF. 3.  Aortic Atherosclerosis (ICD10-I70.0). Electronically Signed   By: Kerby Moors M.D.   On: 11/07/2018 08:05   Nm Pulmonary Perfusion  Result Date:  11/06/2018 CLINICAL DATA:  Chest pain and shortness of breath for 1 day, elevated D-dimer, chronic kidney disease with elevated creatinine EXAM: NUCLEAR MEDICINE PERFUSION LUNG SCAN TECHNIQUE: Perfusion images were obtained in multiple projections after intravenous injection of radiopharmaceutical. Ventilation scans intentionally deferred if perfusion scan and chest x-ray adequate for interpretation during COVID 19 epidemic. RADIOPHARMACEUTICALS:  1.5 mCi Tc-62mMAA IV COMPARISON:  09/27/2018 Correlation: Chest radiograph 11/06/2018 FINDINGS: Normal perfusion images of both lungs. No segmental or subsegmental perfusion defects. Ventilation exam not performed. IMPRESSION: Normal perfusion lung scan, unchanged. Electronically Signed   By: MLavonia DanaM.D.   On: 11/06/2018 16:27    Labs: BMET Recent Labs  Lab 11/06/18 0926 11/07/18 0453 11/08/18 0433  NA 139 139 139  K 5.4* 5.1 4.6  CL 104 107 101  CO2 17* 18* 24  GLUCOSE 108* 112* 97  BUN 100* 103* 64*  CREATININE 6.82* 7.10* 4.64*  CALCIUM 8.5* 8.1* 8.1*  PHOS  --   --  5.6*   CBC Recent Labs  Lab 11/06/18 0926 11/08/18 0433  WBC 10.7* 8.9  NEUTROABS 9.0* 7.2  HGB 9.0* 8.3*  HCT 29.0* 27.3*  MCV 98.6 99.6  PLT 225 182    Medications:    . aspirin EC  81 mg Oral Daily  . calcium acetate  667 mg Oral TID WC  . Chlorhexidine Gluconate Cloth  6 each Topical Q0600  . ciprofloxacin-dexamethasone  4 drop Left EAR BID  . clopidogrel  75 mg Oral Daily  . docusate sodium  100 mg Oral Q12H  . febuxostat  40 mg Oral Daily  . heparin  5,000 Units Subcutaneous Q8H  . hydrALAZINE  37.5 mg Oral BID  . insulin aspart  0-9 Units Subcutaneous Q4H  . mouth rinse  15 mL Mouth Rinse BID  . metoprolol tartrate  12.5 mg Oral BID  . pantoprazole  40 mg Oral QAC breakfast  . sodium bicarbonate  650 mg Oral BID  . thyroid  120 mg Oral Q0600      JOtelia Santee MD 11/08/2018, 1:03 PM

## 2018-11-08 NOTE — Progress Notes (Signed)
Dr. Silas [place] paged to see if he could order pts Gabapentin for sleep. Pt takes at home, not ordered for admit.

## 2018-11-08 NOTE — Procedures (Signed)
    HEMODIALYSIS TREATMENT NOTE (HD #2):  Uneventful hemodialysis session completed via right upper arm AVF (17g/antegrade). Goal met: 1 liter removed without interruption in ultrafiltration.  All blood was returned and hemostasis was achieved in 15 minutes.  Needs iron studies and ESA orders.  Discussed with Dr. Augustin Coupe.  Rockwell Alexandria, RN

## 2018-11-09 LAB — CBC WITH DIFFERENTIAL/PLATELET
Abs Immature Granulocytes: 0.03 10*3/uL (ref 0.00–0.07)
Basophils Absolute: 0 10*3/uL (ref 0.0–0.1)
Basophils Relative: 1 %
Eosinophils Absolute: 0.3 10*3/uL (ref 0.0–0.5)
Eosinophils Relative: 4 %
HCT: 26.9 % — ABNORMAL LOW (ref 36.0–46.0)
Hemoglobin: 8.4 g/dL — ABNORMAL LOW (ref 12.0–15.0)
Immature Granulocytes: 1 %
Lymphocytes Relative: 12 %
Lymphs Abs: 0.8 10*3/uL (ref 0.7–4.0)
MCH: 30.3 pg (ref 26.0–34.0)
MCHC: 31.2 g/dL (ref 30.0–36.0)
MCV: 97.1 fL (ref 80.0–100.0)
Monocytes Absolute: 0.6 10*3/uL (ref 0.1–1.0)
Monocytes Relative: 9 %
Neutro Abs: 4.9 10*3/uL (ref 1.7–7.7)
Neutrophils Relative %: 73 %
Platelets: 186 10*3/uL (ref 150–400)
RBC: 2.77 MIL/uL — ABNORMAL LOW (ref 3.87–5.11)
RDW: 14.4 % (ref 11.5–15.5)
WBC: 6.6 10*3/uL (ref 4.0–10.5)
nRBC: 0 % (ref 0.0–0.2)

## 2018-11-09 LAB — GLUCOSE, CAPILLARY
Glucose-Capillary: 96 mg/dL (ref 70–99)
Glucose-Capillary: 137 mg/dL — ABNORMAL HIGH (ref 70–99)
Glucose-Capillary: 174 mg/dL — ABNORMAL HIGH (ref 70–99)
Glucose-Capillary: 89 mg/dL (ref 70–99)

## 2018-11-09 LAB — STREP PNEUMONIAE URINARY ANTIGEN: Strep Pneumo Urinary Antigen: NEGATIVE

## 2018-11-09 NOTE — Progress Notes (Signed)
PROGRESS NOTE    Briana CANDO  [name]:[id number] DOB: [birth year] DOA: 11/06/2018 PCP: Doree Albee, MD     Brief Narrative:  78 y.o. female with medical history significant for progressive renal insufficiency, gout, NSTEMI, DM 2, hypertension, hypothyroidism, who presented to the ED with complaints of Central chest pain that started this morning 4 AM in the morning while she was trying to sleep.  Chest pain is nonradiating and associated with difficulty breathing.  No associated nausea or vomiting.  Chest pain persisted so she came to the ED.  No known aggravating or relieving factors.  Patient found with acute on chronic renal failure, acute on chronic diastolic heart failure, worsening shortness of breath and end STEMI.  She has been admitted for further evaluation and management.   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 -Increased shortness of breath and imaging studies suggesting vascular congestion and interstitial edema. -Patient tolerated well first and second dialysis session; plan is to repeat another treatment on 6/15.  Will follow nephrology recommendations. -Follow clinical response  2-chest pain/NSTEMI -Patient with underlying history of coronary artery disease medically manage due to chronic renal failure and inability for heart cath. -Pain improve/resolved with the use of nitroglycerin -Troponin has peaked at 4.14 -Cardiology service has been consulted for further recommendation -Will continue the use of beta-blocker, Plavix and aspirin.  3-hypothyroidism -Continue Armour  4-gastroesophageal reflux disease -Continue PPI.  5-type 2 diabetes mellitus -Continue sliding scale insulin and follow CBGs.  6-essential hypertension -Continue current medication -Blood pressure stable and well-controlled.  7-concerns for  pneumonia -No fever, no WBCs and no complaints of coughing spells currently -Will continue monitoring patient off antibiotics -Continue to focus on volume control and relieve her vascular congestion. -Follow clinical response and repeat chest x-ray at to assess infiltrates.Marland Kitchen  8-acute on chronic diastolic heart failure -Follow daily weights -Volume stabilization with dialysis initiation -Follow low-sodium diet -Strict I's and O's. -Follow clinical response.  9-hyperkalemia -Kayexalate given at time of admission -Further electrolytes and stabilization to be accomplished with hemodialysis -Follow nephrology recommendations. -Potassium within normal limits at this time.   DVT prophylaxis: Heparin Code Status: Full code Family Communication: No family at bedside. Disposition Plan: Remains inpatient, for now continue monitoring in a stepdown unit (given high flow nasal cannula needs).  Continue to follow nephrology recommendations.  Anticipated anticipatedNext dialysis treatment on 11/10/2018.  Will follow further cardiology recommendation.  Consultants:   Cardiology  Nephrology  Procedures:   See below for x-ray reports.  Antimicrobials:  Anti-infectives (From admission, onward)   Start     Dose/Rate Route Frequency Ordered Stop   11/07/18 2200  ceFEPIme (MAXIPIME) 1 g in sodium chloride 0.9 % 100 mL IVPB  Status:  Discontinued     1 g 200 mL/hr over 30 Minutes Intravenous Every 24 hours 11/07/18 1209 11/07/18 1227   11/07/18 1800  vancomycin (VANCOCIN) IVPB 750 mg/150 ml premix  Status:  Discontinued     750 mg 150 mL/hr over 60 Minutes Intravenous  Once 11/07/18 1214 11/07/18 1227   11/07/18 0745  vancomycin (VANCOCIN) 1,500 mg in sodium chloride 0.9 % 500 mL IVPB     1,500 mg 250 mL/hr over 120 Minutes Intravenous  Once 11/07/18 0735 11/07/18 1044   11/07/18 0730  ceFEPIme (MAXIPIME) 1 g in sodium chloride 0.9 % 100 mL IVPB  Status:  Discontinued  1 g 200 mL/hr over  30 Minutes Intravenous Every 24 hours 11/07/18 0726 11/07/18 1209   11/07/18 0730  vancomycin (VANCOCIN) IVPB 1000 mg/200 mL premix  Status:  Discontinued     1,000 mg 200 mL/hr over 60 Minutes Intravenous  Once 11/07/18 0728 11/07/18 0735   11/06/18 2200  sulfamethoxazole-trimethoprim (BACTRIM) 400-80 MG per tablet 1 tablet  Status:  Discontinued     1 tablet Oral Daily at 10 pm 11/06/18 2106 11/07/18 1227       Subjective: No fever, no coughing, no chest pain.  Still requiring high flow nasal cannula oxygen supplementation (9 L).  Slowly improving.  Tolerated secondary to dialysis without complications.  Objective: Vitals:   11/09/18 0900 11/09/18 1000 11/09/18 1100 11/09/18 1200  BP: (!) 145/55 108/68 (!) 139/54 (!) 150/62  Pulse: 62 71 62 (!) 59  Resp: 13 15 11 13   Temp:      TempSrc:      SpO2: 95% 97% 98% 92%  Weight:      Height:        Intake/Output Summary (Last 24 hours) at 11/09/2018 1319 Last data filed at 11/09/2018 0634 Gross per 24 hour  Intake 240 ml  Output 1300 ml  Net -1060 ml   Filed Weights   11/08/18 1030 11/08/18 1400 11/09/18 0500  Weight: 80.4 kg 79.3 kg 79.6 kg    Examination: General exam: Alert, awake, oriented x 3; in no acute distress.  Reports no further chest pain.  Breathing continued to be easier.  Using high flow nasal cannula supplementation 9 L. Respiratory system: No wheezing, decreased breath sounds at the bases; no using accessory muscles.  Cardiovascular system: RRR. No murmurs, rubs, gallops. Gastrointestinal system: Abdomen is nondistended, soft and nontender. No organomegaly or masses felt. Normal bowel sounds heard. Central nervous system: Alert and oriented. No focal neurological deficits. Extremities: No C/C/E, + right upper extremity aVF thrills. Skin: No rashes, no petechiae.  3 x 3 cm healing dry one with callus on the sole of her right lower extremity without signs of superimposed infection currently. Psychiatry: Judgement  and insight appear normal. Mood & affect appropriate.   Data Reviewed: I have personally reviewed following labs and imaging studies  CBC: Recent Labs  Lab 11/06/18 0926 11/08/18 0433 11/09/18 0439  WBC 10.7* 8.9 6.6  NEUTROABS 9.0* 7.2 4.9  HGB 9.0* 8.3* 8.4*  HCT 29.0* 27.3* 26.9*  MCV 98.6 99.6 97.1  PLT 225 182 161   Basic Metabolic Panel: Recent Labs  Lab 11/06/18 0926 11/07/18 0453 11/08/18 0433  NA 139 139 139  K 5.4* 5.1 4.6  CL 104 107 101  CO2 17* 18* 24  GLUCOSE 108* 112* 97  BUN 100* 103* 64*  CREATININE 6.82* 7.10* 4.64*  CALCIUM 8.5* 8.1* 8.1*  PHOS  --   --  5.6*   GFR: Estimated Creatinine Clearance: 10.2 mL/min (A) (by C-G formula based on SCr of 4.64 mg/dL (H)).  Cardiac Enzymes: Recent Labs  Lab 11/06/18 1253 11/06/18 1848 11/07/18 0033 11/07/18 0453 11/07/18 1125  TROPONINI 0.04* 0.05* 0.05* 0.05* 4.14*   CBG: Recent Labs  Lab 11/08/18 1143 11/08/18 1620 11/08/18 2132 11/09/18 0747 11/09/18 1140  GLUCAP 112* 119* 107* 96 137*   Urine analysis:    Component Value Date/Time   COLORURINE YELLOW 03/09/2017 0801   APPEARANCEUR CLEAR 03/09/2017 0801   LABSPEC 1.013 03/09/2017 0801   PHURINE 5.0 03/09/2017 0801   GLUCOSEU 50 (A) 03/09/2017 0801   HGBUR NEGATIVE  03/09/2017 0801   BILIRUBINUR NEGATIVE 03/09/2017 0801   KETONESUR NEGATIVE 03/09/2017 0801   PROTEINUR 30 (A) 03/09/2017 0801   UROBILINOGEN 0.2 02/08/2015 1615   NITRITE NEGATIVE 03/09/2017 0801   LEUKOCYTESUR NEGATIVE 03/09/2017 0801    Recent Results (from the past 240 hour(s))  SARS Coronavirus 2 (CEPHEID - Performed in [facility] hospital lab), Hosp Order     Status: None   Collection Time: 11/06/18  1:30 PM   Specimen: Nasopharyngeal Swab  Result Value Ref Range Status   SARS Coronavirus 2 NEGATIVE NEGATIVE Final    Comment: (NOTE) If result is NEGATIVE SARS-CoV-2 target nucleic acids are NOT DETECTED. The SARS-CoV-2 RNA is generally detectable in upper and  lower  respiratory specimens during the acute phase of infection. The lowest  concentration of SARS-CoV-2 viral copies this assay can detect is 250  copies / mL. A negative result does not preclude SARS-CoV-2 infection  and should not be used as the sole basis for treatment or other  patient management decisions.  A negative result may occur with  improper specimen collection / handling, submission of specimen other  than nasopharyngeal swab, presence of viral mutation(s) within the  areas targeted by this assay, and inadequate number of viral copies  (<250 copies / mL). A negative result must be combined with clinical  observations, patient history, and epidemiological information. If result is POSITIVE SARS-CoV-2 target nucleic acids are DETECTED. The SARS-CoV-2 RNA is generally detectable in upper and lower  respiratory specimens dur ing the acute phase of infection.  Positive  results are indicative of active infection with SARS-CoV-2.  Clinical  correlation with patient history and other diagnostic information is  necessary to determine patient infection status.  Positive results do  not rule out bacterial infection or co-infection with other viruses. If result is PRESUMPTIVE POSTIVE SARS-CoV-2 nucleic acids MAY BE PRESENT.   A presumptive positive result was obtained on the submitted specimen  and confirmed on repeat testing.  While 2019 novel coronavirus  (SARS-CoV-2) nucleic acids may be present in the submitted sample  additional confirmatory testing may be necessary for epidemiological  and / or clinical management purposes  to differentiate between  SARS-CoV-2 and other Sarbecovirus currently known to infect humans.  If clinically indicated additional testing with an alternate test  methodology [phone]) is advised. The SARS-CoV-2 RNA is generally  detectable in upper and lower respiratory sp ecimens during the acute  phase of infection. The expected result is Negative.  Fact Sheet for Patients:  StrictlyIdeas.no Fact Sheet for Healthcare Providers: BankingDealers.co.za This test is not yet approved or cleared by the [place] FDA and has been authorized for detection and/or diagnosis of SARS-CoV-2 by FDA under an Emergency Use Authorization (EUA).  This EUA will remain in effect (meaning this test can be used) for the duration of the COVID-19 declaration under Section 564(b)(1) of the Act, 21 U.S.C. section 360bbb-3(b)(1), unless the authorization is terminated or revoked sooner. Performed at [facility], [address], [place], [place] [address]   MRSA PCR Screening     Status: None   Collection Time: 11/07/18  6:09 AM   Specimen: Nasal Mucosa; Nasopharyngeal  Result Value Ref Range Status   MRSA by PCR NEGATIVE NEGATIVE Final    Comment:        The GeneXpert MRSA Assay (FDA approved for NASAL specimens only), is one component of a comprehensive MRSA colonization surveillance program. It is not intended to diagnose MRSA infection nor to guide  or monitor treatment for MRSA infections. Performed at [facility], [address], [place], [place] [address]   Culture, blood (routine x 2)     Status: None (Preliminary result)   Collection Time: 11/07/18  7:38 AM   Specimen: Right Antecubital; Blood  Result Value Ref Range Status   Specimen Description   Final    RIGHT ANTECUBITAL BOTTLES DRAWN AEROBIC AND ANAEROBIC   Special Requests Blood Culture adequate volume  Final   Culture   Final    NO GROWTH 2 DAYS Performed at [facility], [address] [place], [place] [address]    Report Status PENDING  Incomplete  Culture, blood (routine x 2)     Status: None (Preliminary result)   Collection Time: 11/07/18  7:38 AM   Specimen: BLOOD LEFT FOREARM  Result Value Ref Range Status   Specimen Description   Final    BLOOD LEFT FOREARM BOTTLES DRAWN AEROBIC AND ANAEROBIC   Special Requests Blood  Culture adequate volume  Final   Culture   Final    NO GROWTH 2 DAYS Performed at [facility], [address], [place], [place] [address]    Report Status PENDING  Incomplete     Radiology Studies: No results found.  Scheduled Meds: . aspirin EC  81 mg Oral Daily  . calcium acetate  667 mg Oral TID WC  . Chlorhexidine Gluconate Cloth  6 each Topical Q0600  . ciprofloxacin-dexamethasone  4 drop Left EAR BID  . clopidogrel  75 mg Oral Daily  . docusate sodium  100 mg Oral Q12H  . febuxostat  40 mg Oral Daily  . gabapentin  300 mg Oral QHS  . heparin  5,000 Units Subcutaneous Q8H  . hydrALAZINE  37.5 mg Oral BID  . insulin aspart  0-5 Units Subcutaneous QHS  . insulin aspart  0-9 Units Subcutaneous TID WC  . mouth rinse  15 mL Mouth Rinse BID  . metoprolol tartrate  12.5 mg Oral BID  . pantoprazole  40 mg Oral QAC breakfast  . sodium bicarbonate  650 mg Oral BID  . thyroid  120 mg Oral Q0600   Continuous Infusions: . sodium chloride    . sodium chloride       LOS: 2 days    Time spent: 35 minutes.  Greater than 50% of this time was spent in direct contact with the patient, coordinating care and discussing relevant ongoing clinical issues, including discussion about ongoing vascular congestion, still high oxygen supplementation requirements and the need of further dialysis treatment to further stabilize her condition.  Patient continue requiring high flow nasal cannula supplementation (now on 9 L), no further chest pain.  All questions were answered to patient satisfaction.  She tolerated well second day of dialysis on 11/08/2018.   Barton Dubois, MD [facility] Pager [phone]   11/09/2018, 1:19 PM

## 2018-11-09 NOTE — Progress Notes (Signed)
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 Workup in the ED was notable for mildly elevated troponin, nonspecific t wave changes, VQ scan normal, but also noted to have BUN/Cr of 100/6.82.  She is normally followed by Dr. Lowanda Foster and was last seen 11/05/18 w/ rt AVF already placed (05/2018). Acute incr in Cr related to Bactrim DS bid for the past 2 weeks for a foot ulcer.    1. AKI/CKD stage 5- presumably due to bactrim DS with advanced underlying CKD. She was initiated on HD for uremia  and has had a eGFR of 10 or less for the past few months.  - HD #1 6/12,  - HD #2 6/13 (1L net UF)  Will plan next HD for Mon to facilitate outpt planning; even if TTS then she can miss tues and start outpt Thur. Will limit UF to 500 ML net.  Still being CLIP'd toFeeling much better.  Denies f/c/n/vcough.   Nausea overnight, last at [name]; she can be followed as an outpatient HD with  Dr. Lowanda Foster and Ramiro Harvest, PA-C and notify that she has started dialysis.General appearance:cooperative, fatigued and no distress    2. Chest pain- resolved. W/u per primary 3. SOB- CT with pleural effusions and interstitial pulmonary infiltrates, concerning for CHF. Will plan for UF with HD and recheck CXR and if improved infiltrates, makes pneumonia less likely 4. Anemia of CKD- will start ESA and replete iron stores. Will also check iron stores 5. SHPTH- iPTH 114. Will need to check phos -> 5.6. Cont phoslo and will titrate as needed. 6. DM- per primary 7. HTN- stable 8. Foot ulcer- placed on bactrim DS by podiatry. Stopped bactrim and follow. 9. CAD- stable  Subjective:   Feeling much better as far as breathing but nausea overnight. Denies f/c/n/vcough.    Objective:   BP (!) 130/51 (BP  Location: Left Wrist)   Pulse (!) 59   Temp 97.8 F (36.6 C) (Oral)   Resp 15   Ht '5\' 4"'  (1.626 m)   Wt 79.3 kg   SpO2 93%   BMI 30.01 kg/m   Intake/Output Summary (Last 24 hours) at 11/09/2018 9794 Last data filed at 11/08/2018 2222 Gross per 24 hour  Intake 240 ml  Output 1000 ml  Net -760 ml   Weight change:   Physical Exam: General appearance:cooperative, fatigued and no distress Head:NCAT Resp:CTA B/L Cardio:RRR today IA:XKPVV +BS Extremities:no edema Access: RUE BCF +T/B  Imaging: No results found.  Labs: BMET Recent Labs  Lab 11/06/18 0926 11/07/18 0453 11/08/18 0433  NA 139 139 139  K 5.4* 5.1 4.6  CL 104 107 101  CO2 17* 18* 24  GLUCOSE 108* 112* 97  BUN 100* 103* 64*  CREATININE 6.82* 7.10* 4.64*  CALCIUM 8.5* 8.1* 8.1*  PHOS  --   --  5.6*   CBC Recent Labs  Lab 11/06/18 0926 11/08/18 0433 11/09/18 0439  WBC 10.7* 8.9 6.6  NEUTROABS 9.0* 7.2 4.9  HGB 9.0* 8.3* 8.4*  HCT 29.0* 27.3* 26.9*  MCV 98.6 99.6 97.1  PLT 225 182 186    Medications:    . aspirin EC  81 mg Oral Daily  . calcium acetate  667 mg Oral TID WC  . Chlorhexidine Gluconate Cloth  6  each Topical [id number]  . ciprofloxacin-dexamethasone  4 drop Left EAR BID  . clopidogrel  75 mg Oral Daily  . docusate sodium  100 mg Oral Q12H  . febuxostat  40 mg Oral Daily  . gabapentin  300 mg Oral QHS  . heparin  5,000 Units Subcutaneous Q8H  . hydrALAZINE  37.5 mg Oral BID  . insulin aspart  0-5 Units Subcutaneous QHS  . insulin aspart  0-9 Units Subcutaneous TID WC  . mouth rinse  15 mL Mouth Rinse BID  . metoprolol tartrate  12.5 mg Oral BID  . pantoprazole  40 mg Oral QAC breakfast  . sodium bicarbonate  650 mg Oral BID  . thyroid  120 mg Oral Q0600      Briana Santee, MD 11/09/2018, 6:05 AM

## 2018-11-10 ENCOUNTER — Other Ambulatory Visit (HOSPITAL_COMMUNITY): Payer: Self-pay | Admitting: *Deleted

## 2018-11-10 ENCOUNTER — Inpatient Hospital Stay (HOSPITAL_COMMUNITY): Payer: Medicare HMO

## 2018-11-10 DIAGNOSIS — I361 Nonrheumatic tricuspid (valve) insufficiency: Secondary | ICD-10-CM

## 2018-11-10 DIAGNOSIS — R0789 Other chest pain: Secondary | ICD-10-CM

## 2018-11-10 DIAGNOSIS — K219 Gastro-esophageal reflux disease without esophagitis: Secondary | ICD-10-CM

## 2018-11-10 DIAGNOSIS — I340 Nonrheumatic mitral (valve) insufficiency: Secondary | ICD-10-CM

## 2018-11-10 LAB — RENAL FUNCTION PANEL
Albumin: 2.6 g/dL — ABNORMAL LOW (ref 3.5–5.0)
Anion gap: 15 (ref 5–15)
BUN: 64 mg/dL — ABNORMAL HIGH (ref 8–23)
CO2: 24 mmol/L (ref 22–32)
Calcium: 7.9 mg/dL — ABNORMAL LOW (ref 8.9–10.3)
Chloride: 99 mmol/L (ref 98–111)
Creatinine, Ser: 4.43 mg/dL — ABNORMAL HIGH (ref 0.44–1.00)
GFR calc Af Amer: 10 mL/min — ABNORMAL LOW (ref >60)
GFR calc non Af Amer: 9 mL/min — ABNORMAL LOW (ref >60)
Glucose, Bld: 99 mg/dL (ref 70–99)
Phosphorus: 6.2 mg/dL — ABNORMAL HIGH (ref 2.5–4.6)
Potassium: 4.2 mmol/L (ref 3.5–5.1)
Sodium: 138 mmol/L (ref 135–145)

## 2018-11-10 LAB — ECHOCARDIOGRAM COMPLETE
Height: 64 in
Weight: 2776.03 oz

## 2018-11-10 LAB — GLUCOSE, CAPILLARY
Glucose-Capillary: 106 mg/dL — ABNORMAL HIGH (ref 70–99)
Glucose-Capillary: 156 mg/dL — ABNORMAL HIGH (ref 70–99)
Glucose-Capillary: 120 mg/dL — ABNORMAL HIGH (ref 70–99)
Glucose-Capillary: 170 mg/dL — ABNORMAL HIGH (ref 70–99)

## 2018-11-10 LAB — CBC WITH DIFFERENTIAL/PLATELET
Abs Immature Granulocytes: 0.01 10*3/uL (ref 0.00–0.07)
Basophils Absolute: 0 10*3/uL (ref 0.0–0.1)
Basophils Relative: 1 %
Eosinophils Absolute: 0.5 10*3/uL (ref 0.0–0.5)
Eosinophils Relative: 8 %
HCT: 28.3 % — ABNORMAL LOW (ref 36.0–46.0)
Hemoglobin: 8.6 g/dL — ABNORMAL LOW (ref 12.0–15.0)
Immature Granulocytes: 0 %
Lymphocytes Relative: 14 %
Lymphs Abs: 0.9 10*3/uL (ref 0.7–4.0)
MCH: 30.1 pg (ref 26.0–34.0)
MCHC: 30.4 g/dL (ref 30.0–36.0)
MCV: 99 fL (ref 80.0–100.0)
Monocytes Absolute: 0.6 10*3/uL (ref 0.1–1.0)
Monocytes Relative: 9 %
Neutro Abs: 4.4 10*3/uL (ref 1.7–7.7)
Neutrophils Relative %: 68 %
Platelets: 223 10*3/uL (ref 150–400)
RBC: 2.86 MIL/uL — ABNORMAL LOW (ref 3.87–5.11)
RDW: 14.4 % (ref 11.5–15.5)
WBC: 6.3 10*3/uL (ref 4.0–10.5)
nRBC: 0 % (ref 0.0–0.2)

## 2018-11-10 LAB — CREATININE, SERUM
Creatinine, Ser: 4.27 mg/dL — ABNORMAL HIGH (ref 0.44–1.00)
GFR calc Af Amer: 11 mL/min — ABNORMAL LOW (ref >60)
GFR calc non Af Amer: 9 mL/min — ABNORMAL LOW (ref >60)

## 2018-11-10 MED ORDER — DARBEPOETIN ALFA 100 MCG/0.5ML IJ SOSY
100.0000 ug | PREFILLED_SYRINGE | INTRAMUSCULAR | Status: DC
  Administered 2018-11-10 – 2018-11-17 (×2): 100 ug via INTRAVENOUS
  Filled 2018-11-10 (×4): qty 0.5

## 2018-11-10 NOTE — TOC Progression Note (Addendum)
Transition of Care [facility]) - Progression Note    Patient Details  Name: [name] MRN: [id number] Date of Birth: [birth year]  Transition of Care [facility]) CM/SW Contact  Shade Flood, LCSW Phone Number: 11/10/2018, 8:42 AM  Clinical Narrative:     TOC following. Faxed referral to [facility] this AM. Will await their review and assignment of chair time. Looking at [place] in [facility].  Spoke with pt's friend/caregiver, Shirlean Schlein [phone]) late in the day Friday 6/12 and he stated that he would be assisting pt with transportation to and from Dialysis. He states that he helps pt with ADLs as needed. He states that he lives with pt.  9:54 Received return call from [place] requesting additional information which was provided. Coordinator at [employer] is [place] [phone]).      Expected Discharge Plan: Home/Self Care Barriers to Discharge: Continued Medical Work up  Expected Discharge Plan and Services Expected Discharge Plan: Home/Self Care In-house Referral: Clinical Social Work   Post Acute Care Choice: Dialysis Living arrangements for the past 2 months: Single Family Home                                       Social Determinants of Health (SDOH) Interventions    Readmission Risk Interventions No flowsheet data found.

## 2018-11-10 NOTE — Progress Notes (Signed)
PROGRESS NOTE    [name]  EGB:[id number] DOB: September 21, 1940 DOA: 11/06/2018 PCP: Doree Albee, MD     Brief Narrative:  78 y.o. female with medical history significant for progressive renal insufficiency, gout, NSTEMI, DM 2, hypertension, hypothyroidism, who presented to the ED with complaints of Central chest pain that started this morning 4 AM in the morning while she was trying to sleep.  Chest pain is nonradiating and associated with difficulty breathing.  No associated nausea or vomiting.  Chest pain persisted so she came to the ED.  No known aggravating or relieving factors.  Patient found with acute on chronic renal failure, acute on chronic diastolic heart failure, worsening shortness of breath and end STEMI.  She has been admitted for further evaluation and management.   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 -Increased shortness of breath and imaging studies suggesting vascular congestion and interstitial edema. -Patient tolerated well first and second dialysis session (6/12, 6/13); plan is to repeat another treatment later today 6/15.  Will follow nephrology recommendations. -Sleeping process to secure outpatient hemodialysis spot in progress. -Follow clinical response  2-chest pain/NSTEMI -Patient with underlying history of coronary artery disease medically manage due to chronic renal failure and inability for heart cath. -Pain improve/resolved with the use of nitroglycerin -Troponin has peaked at 4.14 -Cardiology service has been consulted for further recommendation -Will continue the use of beta-blocker, Plavix and aspirin.  3-hypothyroidism -Continue Armour  4-gastroesophageal reflux disease -Continue PPI.  5-type 2 diabetes mellitus -Continue sliding scale insulin and follow CBGs.  6-essential  hypertension -Continue current medication -Blood pressure stable and overall well-controlled.  7-concerns for pneumonia -No fever, no WBCs and no complaints of coughing spells currently -Will continue monitoring patient off antibiotics -Continue to focus on volume control and relieve her vascular congestion. -Follow clinical response and repeat chest x-ray at to assess infiltrates. -Repeat chest x-ray 2 views in a.m.  8-acute on chronic diastolic heart failure -Follow daily weights -Volume stabilization with dialysis initiation -Follow low-sodium diet -Strict I's and O's. -Follow clinical response. -2D echo has been ordered by cardiology service, follow results.  9-hyperkalemia -Kayexalate given at time of admission -Further electrolytes and stabilization to be accomplished with hemodialysis -We will continue to follow nephrology recommendations. -Potassium within normal limits at this time.  10-secondary hyperparathyroidism -continue Phoslo  11-anemia of chronic renal failure -No signs of operative bleeding -Continue IV iron and Epogen therapy as per renal discretion -Hemoglobin 8.7 currently.  DVT prophylaxis: Heparin Code Status: Full code Family Communication: No family at bedside. Disposition Plan: Remains inpatient, for now continue monitoring in a stepdown unit (given high flow nasal cannula needs).  Continue to follow nephrology recommendations.  Next dialysis treatment later today.  11/10/2018.  Will follow further cardiology recommendation.  Consultants:   Cardiology  Nephrology  Procedures:   See below for x-ray reports.  2D echo: Pending  Antimicrobials:  Anti-infectives (From admission, onward)   Start     Dose/Rate Route Frequency Ordered Stop   11/07/18 2200  ceFEPIme (MAXIPIME) 1 g in sodium chloride 0.9 % 100 mL IVPB  Status:  Discontinued     1 g 200 mL/hr over 30 Minutes Intravenous Every 24 hours 11/07/18 1209 11/07/18 1227   11/07/18 1800   vancomycin (VANCOCIN) IVPB 750 mg/150 ml premix  Status:  Discontinued     750 mg  150 mL/hr over 60 Minutes Intravenous  Once 11/07/18 1214 11/07/18 1227   11/07/18 0745  vancomycin (VANCOCIN) 1,500 mg in sodium chloride 0.9 % 500 mL IVPB     1,500 mg 250 mL/hr over 120 Minutes Intravenous  Once 11/07/18 0735 11/07/18 1044   11/07/18 0730  ceFEPIme (MAXIPIME) 1 g in sodium chloride 0.9 % 100 mL IVPB  Status:  Discontinued     1 g 200 mL/hr over 30 Minutes Intravenous Every 24 hours 11/07/18 0726 11/07/18 1209   11/07/18 0730  vancomycin (VANCOCIN) IVPB 1000 mg/200 mL premix  Status:  Discontinued     1,000 mg 200 mL/hr over 60 Minutes Intravenous  Once 11/07/18 0728 11/07/18 0735   11/06/18 2200  sulfamethoxazole-trimethoprim (BACTRIM) 400-80 MG per tablet 1 tablet  Status:  Discontinued     1 tablet Oral Daily at 10 pm 11/06/18 2106 11/07/18 1227      Subjective: No fever, no coughing, no chest pain.  Still requiring high flow nasal cannula supplementation (down to 7 L), overall feeling better and slowly improving.   Objective: Vitals:   11/10/18 1136 11/10/18 1200 11/10/18 1300 11/10/18 1400  BP:  (!) 155/57 (!) 140/52 (!) 147/50  Pulse: 66 70 64 65  Resp: 13 19 16 14   Temp: 98.5 F (36.9 C)   98.1 F (36.7 C)  TempSrc: Oral     SpO2: 98% 97% 99% 98%  Weight:    78.9 kg  Height:        Intake/Output Summary (Last 24 hours) at 11/10/2018 1442 Last data filed at 11/10/2018 1300 Gross per 24 hour  Intake 700 ml  Output 400 ml  Net 300 ml   Filed Weights   11/09/18 0500 11/10/18 0500 11/10/18 1400  Weight: 79.6 kg 78.7 kg 78.9 kg    Examination: General exam: Alert, awake, oriented x 3; denies chest pain, no nausea, no vomiting.  Continue feeling better in general and breathing easier.  Reports improving her appetite.  Still requiring oxygen supplementation 7 L high flow nasal cannula. Respiratory system: Decreased breath sounds at the bases, no wheezing, no using  accessory muscles. Cardiovascular system:RRR. No murmurs, rubs, gallops. Gastrointestinal system: Abdomen is nondistended, soft and nontender. No organomegaly or masses felt. Normal bowel sounds heard. Central nervous system: Alert and oriented. No focal neurological deficits. Extremities: No cyanosis or clubbing; no edema.  Right upper extremity aVF with positive thrill Skin: No rashes, no petechiae.  Healing dry wound with callus on the sole of her right lower extremity without signs of superimposed infection.  Patient denies any pain. Psychiatry: Judgement and insight appear normal. Mood & affect appropriate.    Data Reviewed: I have personally reviewed following labs and imaging studies  CBC: Recent Labs  Lab 11/06/18 0926 11/08/18 0433 11/09/18 0439 11/10/18 0429  WBC 10.7* 8.9 6.6 6.3  NEUTROABS 9.0* 7.2 4.9 4.4  HGB 9.0* 8.3* 8.4* 8.6*  HCT 29.0* 27.3* 26.9* 28.3*  MCV 98.6 99.6 97.1 99.0  PLT 225 182 186 532   Basic Metabolic Panel: Recent Labs  Lab 11/06/18 0926 11/07/18 0453 11/08/18 0433 11/10/18 0429  NA 139 139 139  --   K 5.4* 5.1 4.6  --   CL 104 107 101  --   CO2 17* 18* 24  --   GLUCOSE 108* 112* 97  --   BUN 100* 103* 64*  --   CREATININE 6.82* 7.10* 4.64* 4.27*  CALCIUM 8.5* 8.1* 8.1*  --   PHOS  --   --  5.6*  --    GFR: Estimated Creatinine Clearance: 11 mL/min (A) (by C-G formula based on SCr of 4.27 mg/dL (H)).  Cardiac Enzymes: Recent Labs  Lab 11/06/18 1253 11/06/18 1848 11/07/18 0033 11/07/18 0453 11/07/18 1125  TROPONINI 0.04* 0.05* 0.05* 0.05* 4.14*   CBG: Recent Labs  Lab 11/09/18 1140 11/09/18 1619 11/09/18 2125 11/10/18 0732 11/10/18 1135  GLUCAP 137* 174* 89 106* 156*   Urine analysis:    Component Value Date/Time   COLORURINE YELLOW 03/09/2017 0801   APPEARANCEUR CLEAR 03/09/2017 0801   LABSPEC 1.013 03/09/2017 0801   PHURINE 5.0 03/09/2017 0801   GLUCOSEU 50 (A) 03/09/2017 0801   HGBUR NEGATIVE 03/09/2017 0801    BILIRUBINUR NEGATIVE 03/09/2017 0801   KETONESUR NEGATIVE 03/09/2017 0801   PROTEINUR 30 (A) 03/09/2017 0801   UROBILINOGEN 0.2 02/08/2015 1615   NITRITE NEGATIVE 03/09/2017 0801   LEUKOCYTESUR NEGATIVE 03/09/2017 0801    Recent Results (from the past 240 hour(s))  SARS Coronavirus 2 (CEPHEID - Performed in [place] hospital lab), Hosp Order     Status: None   Collection Time: 11/06/18  1:30 PM   Specimen: Nasopharyngeal Swab  Result Value Ref Range Status   SARS Coronavirus 2 NEGATIVE NEGATIVE Final    Comment: (NOTE) If result is NEGATIVE SARS-CoV-2 target nucleic acids are NOT DETECTED. The SARS-CoV-2 RNA is generally detectable in upper and lower  respiratory specimens during the acute phase of infection. The lowest  concentration of SARS-CoV-2 viral copies this assay can detect is 250  copies / mL. A negative result does not preclude SARS-CoV-2 infection  and should not be used as the sole basis for treatment or other  patient management decisions.  A negative result may occur with  improper specimen collection / handling, submission of specimen other  than nasopharyngeal swab, presence of viral mutation(s) within the  areas targeted by this assay, and inadequate number of viral copies  (<250 copies / mL). A negative result must be combined with clinical  observations, patient history, and epidemiological information. If result is POSITIVE SARS-CoV-2 target nucleic acids are DETECTED. The SARS-CoV-2 RNA is generally detectable in upper and lower  respiratory specimens dur ing the acute phase of infection.  Positive  results are indicative of active infection with SARS-CoV-2.  Clinical  correlation with patient history and other diagnostic information is  necessary to determine patient infection status.  Positive results do  not rule out bacterial infection or co-infection with other viruses. If result is PRESUMPTIVE POSTIVE SARS-CoV-2 nucleic acids MAY BE PRESENT.    A presumptive positive result was obtained on the submitted specimen  and confirmed on repeat testing.  While 2019 novel coronavirus  (SARS-CoV-2) nucleic acids may be present in the submitted sample  additional confirmatory testing may be necessary for epidemiological  and / or clinical management purposes  to differentiate between  SARS-CoV-2 and other Sarbecovirus currently known to infect humans.  If clinically indicated additional testing with an alternate test  methodology [phone]) is advised. The SARS-CoV-2 RNA is generally  detectable in upper and lower respiratory sp ecimens during the acute  phase of infection. The expected result is Negative. Fact Sheet for Patients:  StrictlyIdeas.no Fact Sheet for Healthcare Providers: BankingDealers.co.za This test is not yet approved or cleared by the [place] FDA and has been authorized for detection and/or diagnosis of SARS-CoV-2 by FDA under an Emergency Use Authorization (EUA).  This EUA will remain in effect (meaning this test can be used) for  the duration of the COVID-19 declaration under Section 564(b)(1) of the Act, 21 U.S.C. section 360bbb-3(b)(1), unless the authorization is terminated or revoked sooner. Performed at [facility], [address], [place], [place] [address]   MRSA PCR Screening     Status: None   Collection Time: 11/07/18  6:09 AM   Specimen: Nasal Mucosa; Nasopharyngeal  Result Value Ref Range Status   MRSA by PCR NEGATIVE NEGATIVE Final    Comment:        The GeneXpert MRSA Assay (FDA approved for NASAL specimens only), is one component of a comprehensive MRSA colonization surveillance program. It is not intended to diagnose MRSA infection nor to guide or monitor treatment for MRSA infections. Performed at [facility], [address], [place], [place] [address]   Culture, blood (routine x 2)     Status: None (Preliminary result)   Collection Time:  11/07/18  7:38 AM   Specimen: Right Antecubital; Blood  Result Value Ref Range Status   Specimen Description   Final    RIGHT ANTECUBITAL BOTTLES DRAWN AEROBIC AND ANAEROBIC   Special Requests Blood Culture adequate volume  Final   Culture   Final    NO GROWTH 2 DAYS Performed at [facility], [address], [place], [place] [address]    Report Status PENDING  Incomplete  Culture, blood (routine x 2)     Status: None (Preliminary result)   Collection Time: 11/07/18  7:38 AM   Specimen: BLOOD LEFT FOREARM  Result Value Ref Range Status   Specimen Description   Final    BLOOD LEFT FOREARM BOTTLES DRAWN AEROBIC AND ANAEROBIC   Special Requests Blood Culture adequate volume  Final   Culture   Final    NO GROWTH 2 DAYS Performed at [facility], [address] [place],  [address]    Report Status PENDING  Incomplete     Radiology Studies: No results found.  Scheduled Meds:  aspirin EC  81 mg Oral Daily   calcium acetate  667 mg Oral TID WC   Chlorhexidine Gluconate Cloth  6 each Topical Q0600   ciprofloxacin-dexamethasone  4 drop Left EAR BID   clopidogrel  75 mg Oral Daily   darbepoetin (ARANESP) injection - DIALYSIS  100 mcg Intravenous Q Mon-HD   docusate sodium  100 mg Oral Q12H   febuxostat  40 mg Oral Daily   gabapentin  300 mg Oral QHS   heparin  5,000 Units Subcutaneous Q8H   hydrALAZINE  37.5 mg Oral BID   insulin aspart  0-5 Units Subcutaneous QHS   insulin aspart  0-9 Units Subcutaneous TID WC   mouth rinse  15 mL Mouth Rinse BID   metoprolol tartrate  12.5 mg Oral BID   pantoprazole  40 mg Oral QAC breakfast   thyroid  120 mg Oral Q0600   Continuous Infusions:  sodium chloride     sodium chloride       LOS: 3 days    Time spent: 30 minutes.    Barton Dubois, MD [facility] Pager [phone]   11/10/2018, 2:42 PM

## 2018-11-10 NOTE — Progress Notes (Signed)
*  PRELIMINARY RESULTS* Echocardiogram 2D Echocardiogram has been performed.  [name] 11/10/2018, 10:24 AM

## 2018-11-10 NOTE — Care Management Important Message (Signed)
Important Message  Patient Details  Name: [name] MRN: [id number] Date of Birth: 12-09-1940   Medicare Important Message Given:  Yes    Tommy Medal 11/10/2018, 4:04 PM

## 2018-11-10 NOTE — Procedures (Signed)
     HEMODIALYSIS TREATMENT NOTE (HD #3):  3.5 hour low-heparin dialysis treatment completed via right upper arm AVF (17g/antegrade). Max Qb 275.  Goal met: 500cc removed.  Ultrafiltration was suspended for 15 minutes when pt converted to Afib 130-140.  With interruption of fluid removal, pulse decreased to 100-115 and UF was resumed at a lower rate.  Pt remained asymptomatic and, after 45 minutes, she converted back to NSR with occasional PACs.  BP stable throughout session.  Weaned to 3L O2 via [place].  All blood was returned and hemostasis was achieved in 15 minutes.  Rockwell Alexandria, RN

## 2018-11-10 NOTE — Progress Notes (Signed)
Progress Note  Patient Name: [name] Date of Encounter: 11/10/2018  Primary Cardiologist: Carlyle Dolly, MD   Subjective   No complaints  Inpatient Medications    Scheduled Meds: . aspirin EC  81 mg Oral Daily  . calcium acetate  667 mg Oral TID WC  . Chlorhexidine Gluconate Cloth  6 each Topical Q0600  . ciprofloxacin-dexamethasone  4 drop Left EAR BID  . clopidogrel  75 mg Oral Daily  . docusate sodium  100 mg Oral Q12H  . febuxostat  40 mg Oral Daily  . gabapentin  300 mg Oral QHS  . heparin  5,000 Units Subcutaneous Q8H  . hydrALAZINE  37.5 mg Oral BID  . insulin aspart  0-5 Units Subcutaneous QHS  . insulin aspart  0-9 Units Subcutaneous TID WC  . mouth rinse  15 mL Mouth Rinse BID  . metoprolol tartrate  12.5 mg Oral BID  . pantoprazole  40 mg Oral QAC breakfast  . sodium bicarbonate  650 mg Oral BID  . thyroid  120 mg Oral Q0600   Continuous Infusions: . sodium chloride    . sodium chloride     PRN Meds: sodium chloride, sodium chloride, acetaminophen **OR** acetaminophen, heparin, lidocaine (PF), lidocaine-prilocaine, nitroGLYCERIN, ondansetron **OR** ondansetron (ZOFRAN) IV, pentafluoroprop-tetrafluoroeth, polyethylene glycol   Vital Signs    Vitals:   11/10/18 0300 11/10/18 0400 11/10/18 0500 11/10/18 0735  BP: (!) 117/46 (!) 116/45 (!) 139/54   Pulse: (!) 56 (!) 56 60 61  Resp: 14 15 13 16   Temp:  98.4 F (36.9 C)  98.1 F (36.7 C)  TempSrc:  Oral  Oral  SpO2: 93% 92% 96% 96%  Weight:   78.7 kg   Height:        Intake/Output Summary (Last 24 hours) at 11/10/2018 0854 Last data filed at 11/10/2018 0500 Gross per 24 hour  Intake 220 ml  Output 400 ml  Net -180 ml   Last 3 Weights 11/10/2018 11/09/2018 11/08/2018  Weight (lbs) 173 lb 8 oz 175 lb 7.8 oz 174 lb 13.2 oz  Weight (kg) 78.7 kg 79.6 kg 79.3 kg      Telemetry    SR - Personally Reviewed  ECG    n/a - Personally Reviewed  Physical Exam   GEN: No acute distress.    Neck: No JVD Cardiac: RRR, no murmurs, rubs, or gallops.  Respiratory: Clear to auscultation bilaterally. GI: Soft, nontender, non-distended  MS: No edema; No deformity. Neuro:  Nonfocal  Psych: Normal affect   Labs    Chemistry Recent Labs  Lab 11/06/18 0926 11/07/18 0453 11/08/18 0433 11/10/18 0429  NA 139 139 139  --   K 5.4* 5.1 4.6  --   CL 104 107 101  --   CO2 17* 18* 24  --   GLUCOSE 108* 112* 97  --   BUN 100* 103* 64*  --   CREATININE 6.82* 7.10* 4.64* 4.27*  CALCIUM 8.5* 8.1* 8.1*  --   ALBUMIN  --   --  3.0*  --   GFRNONAA 5* 5* 8* 9*  GFRAA 6* 6* 10* 11*  ANIONGAP 18* 14 14  --      Hematology Recent Labs  Lab 11/08/18 0433 11/09/18 0439 11/10/18 0429  WBC 8.9 6.6 6.3  RBC 2.74* 2.77* 2.86*  HGB 8.3* 8.4* 8.6*  HCT 27.3* 26.9* 28.3*  MCV 99.6 97.1 99.0  MCH 30.3 30.3 30.1  MCHC 30.4 31.2 30.4  RDW 14.8 14.4  14.4  PLT 182 186 223    Cardiac Enzymes Recent Labs  Lab 11/06/18 1848 11/07/18 0033 11/07/18 0453 11/07/18 1125  TROPONINI 0.05* 0.05* 0.05* 4.14*   No results for input(s): TROPIPOC in the last 168 hours.   BNPNo results for input(s): BNP, PROBNP in the last 168 hours.   DDimer  Recent Labs  Lab 11/06/18 0926  DDIMER 1.77*     Radiology    No results found.  Cardiac Studies    Patient Profile     [name] is a 78 y.o. female with a hx of NSTEMI rx medically due to renal dz and anemia, on DAPT>>stopped 2nd anemia, intol statins, no ACE/ARB 2nd poor renal function, EF 50-55% on echo 2018, GIB req transfusions, HTN, CKD nearing HD, hypothyroid, gout, who is being seen today for the evaluation of NSTEMI at the request of Dr Dyann Kief.  Assessment & Plan    1. NSTEMI - multiple prior admissions with NSTEMI in the setting of severe anemia - - her initial NSTEMIadmission was at [facility] treated medically including with DAPT, primarily due to advanced renal dysfunction and anemia atht time. Peak troponin 8.5. -  readmitted 10/2016 with Hgb of 4.4 , elevated troponin again. DAPT stopped. Peak trop 1.8. - Her renal function and recurrent anemia had prohibited ischemic testing. - 07/2017 admit with dark stools and anemia, off DAPT temporarily  - this admit recurrent NSTEMI with some evidence of fluid overload and worsening renal failure. Trop up to 4. Symptoms much improved with fluid removal with HD - medical therapy with ASA, plavix 75, lopressor 12.5mg  bid. She has statin allergy - repeat echo   2. CKD - started on HD newly this admission    3. Pneumonia - per primary team    Still with high O2 requirement. Anticipate cath once respiratory status has improved further with abx and fluid removal with HD, likely mid to late week    For questions or updates, please contact [place] Please consult [email or web address] for contact info under        Signed, Carlyle Dolly, MD  11/10/2018, 8:54 AM

## 2018-11-10 NOTE — Progress Notes (Addendum)
[facility] ROUNDING NOTE   Subjective:   Brief history 78 year old female with history of coronary artery disease status post non-STEMI.  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  However last 2D echo 10/17/2017 showed an ejection fraction of 60 to 65%.  She has a 2D echo scheduled for 11/10/2018 appreciate assistance from Dr. Harl Bowie.  Patient was admitted with uremic signs and symptoms 11/06/2018  Patient is followed by Dr. Lowanda Foster and had right AV fistula placed January 2020.  Dialysis #1 11/07/2018 Dialysis #2 11/08/2018 Dialysis #3 11/10/2018   Blood pressure 160/47 pulse 67 temperature 98.1 O2 sats 100%.  Urine output 600 cc 11/09/2018 weight 78.7 kg unchanged from admission weight 11/06/2018  Medications aspirin 81 mg daily PhosLo 667 mg 3 times daily with meals Plavix 75 mg daily, Colace 100 mg twice daily, Uloric 40 mg daily, Neurontin 300 mg daily, hydralazine 37.5 mg twice daily, insulin sliding scale, metoprolol 12.5 mg twice daily, Protonix 40 mg daily, sodium bicarbonate 650 mg twice daily, thyroid tablet 120 mg daily  Hemoglobin 8.7   sodium 139 potassium 4.6 chloride 101 CO2 24 BUN 64 creatinine 4.64 glucose 97 calcium 8.1 phosphorus 5.6 albumin 3.0   iron saturations 22% 10/30/2018   TSH 0.326 11/06/2018   PTH 114 10/29/2018  Blood cultures negative  Chest CT multifocal airspace consolidation atelectasis cardiac enlargement 3 vessel coronary artery calcifications pulmonary edema bilateral effusions aortic atherosclerosis 11/07/2018  VQ scan negative  Objective:  Vital signs in last 24 hours:  Temp:  [97.7 F (36.5 C)-98.4 F (36.9 C)] 98.1 F (36.7 C) (06/15 0735) Pulse Rate:  [56-71] 61 (06/15 0735) Resp:  [11-24] 16 (06/15 0735) BP: (108-167)/(45-68) 139/54 (06/15 0500) SpO2:  [89 %-99 %] 96 % (06/15 0735) Weight:  [78.7 kg] 78.7 kg (06/15 0500)  Weight change: -1.7 kg Filed Weights   11/08/18 1400 11/09/18  0500 11/10/18 0500  Weight: 79.3 kg 79.6 kg 78.7 kg    Intake/Output: I/O last 3 completed shifts: In: 460 [P.O.:460] Out: 700 [Urine:700]   Intake/Output this shift:  No intake/output data recorded.  CVS- RRR  no JVP no murmurs rubs or gallops RS- CTA  ABD- BS present soft non-distended external urinary catheter EXT- no edema   right AV fistula      Basic Metabolic Panel: Recent Labs  Lab 11/06/18 0926 11/07/18 0453 11/08/18 0433 11/10/18 0429  NA 139 139 139  --   K 5.4* 5.1 4.6  --   CL 104 107 101  --   CO2 17* 18* 24  --   GLUCOSE 108* 112* 97  --   BUN 100* 103* 64*  --   CREATININE 6.82* 7.10* 4.64* 4.27*  CALCIUM 8.5* 8.1* 8.1*  --   PHOS  --   --  5.6*  --     Liver Function Tests: Recent Labs  Lab 11/08/18 0433  ALBUMIN 3.0*   No results for input(s): LIPASE, AMYLASE in the last 168 hours. No results for input(s): AMMONIA in the last 168 hours.  CBC: Recent Labs  Lab 11/06/18 0926 11/08/18 0433 11/09/18 0439 11/10/18 0429  WBC 10.7* 8.9 6.6 6.3  NEUTROABS 9.0* 7.2 4.9 4.4  HGB 9.0* 8.3* 8.4* 8.6*  HCT 29.0* 27.3* 26.9* 28.3*  MCV 98.6 99.6 97.1 99.0  PLT 225 182 186 223    Cardiac Enzymes: Recent Labs  Lab 11/06/18 1253 11/06/18 1848 11/07/18 0033 11/07/18 0453 11/07/18 1125  TROPONINI 0.04* 0.05* 0.05* 0.05* 4.14*  BNP: Invalid input(s): POCBNP  CBG: Recent Labs  Lab 11/09/18 0747 11/09/18 1140 11/09/18 1619 11/09/18 2125 11/10/18 0732  GLUCAP 96 137* 174* 89 106*    Microbiology: Results for orders placed or performed during the hospital encounter of 11/06/18  SARS Coronavirus 2 (CEPHEID - Performed in [place] hospital lab), Hosp Order     Status: None   Collection Time: 11/06/18  1:30 PM   Specimen: Nasopharyngeal Swab  Result Value Ref Range Status   SARS Coronavirus 2 NEGATIVE NEGATIVE Final    Comment: (NOTE) If result is NEGATIVE SARS-CoV-2 target nucleic acids are NOT DETECTED. The SARS-CoV-2 RNA is  generally detectable in upper and lower  respiratory specimens during the acute phase of infection. The lowest  concentration of SARS-CoV-2 viral copies this assay can detect is 250  copies / mL. A negative result does not preclude SARS-CoV-2 infection  and should not be used as the sole basis for treatment or other  patient management decisions.  A negative result may occur with  improper specimen collection / handling, submission of specimen other  than nasopharyngeal swab, presence of viral mutation(s) within the  areas targeted by this assay, and inadequate number of viral copies  (<250 copies / mL). A negative result must be combined with clinical  observations, patient history, and epidemiological information. If result is POSITIVE SARS-CoV-2 target nucleic acids are DETECTED. The SARS-CoV-2 RNA is generally detectable in upper and lower  respiratory specimens dur ing the acute phase of infection.  Positive  results are indicative of active infection with SARS-CoV-2.  Clinical  correlation with patient history and other diagnostic information is  necessary to determine patient infection status.  Positive results do  not rule out bacterial infection or co-infection with other viruses. If result is PRESUMPTIVE POSTIVE SARS-CoV-2 nucleic acids MAY BE PRESENT.   A presumptive positive result was obtained on the submitted specimen  and confirmed on repeat testing.  While 2019 novel coronavirus  (SARS-CoV-2) nucleic acids may be present in the submitted sample  additional confirmatory testing may be necessary for epidemiological  and / or clinical management purposes  to differentiate between  SARS-CoV-2 and other Sarbecovirus currently known to infect humans.  If clinically indicated additional testing with an alternate test  methodology [phone]) is advised. The SARS-CoV-2 RNA is generally  detectable in upper and lower respiratory sp ecimens during the acute  phase of  infection. The expected result is Negative. Fact Sheet for Patients:  StrictlyIdeas.no Fact Sheet for Healthcare Providers: BankingDealers.co.za This test is not yet approved or cleared by the [place] FDA and has been authorized for detection and/or diagnosis of SARS-CoV-2 by FDA under an Emergency Use Authorization (EUA).  This EUA will remain in effect (meaning this test can be used) for the duration of the COVID-19 declaration under Section 564(b)(1) of the Act, 21 U.S.C. section 360bbb-3(b)(1), unless the authorization is terminated or revoked sooner. Performed at [facility], [address], [place], [place] [address]   MRSA PCR Screening     Status: None   Collection Time: 11/07/18  6:09 AM   Specimen: Nasal Mucosa; Nasopharyngeal  Result Value Ref Range Status   MRSA by PCR NEGATIVE NEGATIVE Final    Comment:        The GeneXpert MRSA Assay (FDA approved for NASAL specimens only), is one component of a comprehensive MRSA colonization surveillance program. It is not intended to diagnose MRSA infection nor to guide or monitor treatment for MRSA infections.  Performed at [facility], [address] [place], [place] [address]   Culture, blood (routine x 2)     Status: None (Preliminary result)   Collection Time: 11/07/18  7:38 AM   Specimen: Right Antecubital; Blood  Result Value Ref Range Status   Specimen Description   Final    RIGHT ANTECUBITAL BOTTLES DRAWN AEROBIC AND ANAEROBIC   Special Requests Blood Culture adequate volume  Final   Culture   Final    NO GROWTH 2 DAYS Performed at [facility], [address] [place], [place] [address]    Report Status PENDING  Incomplete  Culture, blood (routine x 2)     Status: None (Preliminary result)   Collection Time: 11/07/18  7:38 AM   Specimen: BLOOD LEFT FOREARM  Result Value Ref Range Status   Specimen Description   Final    BLOOD LEFT FOREARM BOTTLES DRAWN  AEROBIC AND ANAEROBIC   Special Requests Blood Culture adequate volume  Final   Culture   Final    NO GROWTH 2 DAYS Performed at [facility], [address] [place], [place] [address]    Report Status PENDING  Incomplete    Coagulation Studies: No results for input(s): LABPROT, INR in the last 72 hours.  Urinalysis: No results for input(s): COLORURINE, LABSPEC, PHURINE, GLUCOSEU, HGBUR, BILIRUBINUR, KETONESUR, PROTEINUR, UROBILINOGEN, NITRITE, LEUKOCYTESUR in the last 72 hours.  Invalid input(s): APPERANCEUR    Imaging: No results found.   Medications:   . sodium chloride    . sodium chloride     . aspirin EC  81 mg Oral Daily  . calcium acetate  667 mg Oral TID WC  . Chlorhexidine Gluconate Cloth  6 each Topical Q0600  . ciprofloxacin-dexamethasone  4 drop Left EAR BID  . clopidogrel  75 mg Oral Daily  . docusate sodium  100 mg Oral Q12H  . febuxostat  40 mg Oral Daily  . gabapentin  300 mg Oral QHS  . heparin  5,000 Units Subcutaneous Q8H  . hydrALAZINE  37.5 mg Oral BID  . insulin aspart  0-5 Units Subcutaneous QHS  . insulin aspart  0-9 Units Subcutaneous TID WC  . mouth rinse  15 mL Mouth Rinse BID  . metoprolol tartrate  12.5 mg Oral BID  . pantoprazole  40 mg Oral QAC breakfast  . sodium bicarbonate  650 mg Oral BID  . thyroid  120 mg Oral Q0600   sodium chloride, sodium chloride, acetaminophen **OR** acetaminophen, heparin, lidocaine (PF), lidocaine-prilocaine, nitroGLYCERIN, ondansetron **OR** ondansetron (ZOFRAN) IV, pentafluoroprop-tetrafluoroeth, polyethylene glycol  Assessment/ Plan:   End-stage renal disease admitted for uremic signs and symptoms initiated dialysis this will be her third dialysis treatment 11/10/2018.  Clip process in place  Congestive heart failure diastolic dysfunction last 2D echo current 2D echo pending  Metabolic acidosis resolved will discontinue sodium bicarbonate  Coronary artery disease continues Plavix and  aspirin  Hypertension appears to be adequately controlled continue ultrafiltration for volume overload  Foot ulcer continue wound care no antibiotics at this present time will need podiatry  Diabetes mellitus controlled  Peripheral neuropathy stable with gabapentin  Gastroesophageal reflux disease continues Protonix.  Anemia iron stores intact will start darbepoetin 100 mcg weekly  Bones continues on binders PTH within goal.   LOS: 3 Sherril Croon @TODAY @9 :30 AM

## 2018-11-11 ENCOUNTER — Inpatient Hospital Stay (HOSPITAL_COMMUNITY): Payer: Medicare HMO

## 2018-11-11 LAB — CBC WITH DIFFERENTIAL/PLATELET
Abs Immature Granulocytes: 0.02 10*3/uL (ref 0.00–0.07)
Basophils Absolute: 0.1 10*3/uL (ref 0.0–0.1)
Basophils Relative: 1 %
Eosinophils Absolute: 0.5 10*3/uL (ref 0.0–0.5)
Eosinophils Relative: 8 %
HCT: 28.5 % — ABNORMAL LOW (ref 36.0–46.0)
Hemoglobin: 8.7 g/dL — ABNORMAL LOW (ref 12.0–15.0)
Immature Granulocytes: 0 %
Lymphocytes Relative: 15 %
Lymphs Abs: 1 10*3/uL (ref 0.7–4.0)
MCH: 30 pg (ref 26.0–34.0)
MCHC: 30.5 g/dL (ref 30.0–36.0)
MCV: 98.3 fL (ref 80.0–100.0)
Monocytes Absolute: 0.7 10*3/uL (ref 0.1–1.0)
Monocytes Relative: 12 %
Neutro Abs: 4 10*3/uL (ref 1.7–7.7)
Neutrophils Relative %: 64 %
Platelets: 245 10*3/uL (ref 150–400)
RBC: 2.9 MIL/uL — ABNORMAL LOW (ref 3.87–5.11)
RDW: 14 % (ref 11.5–15.5)
WBC: 6.2 10*3/uL (ref 4.0–10.5)
nRBC: 0 % (ref 0.0–0.2)

## 2018-11-11 LAB — GLUCOSE, CAPILLARY
Glucose-Capillary: 97 mg/dL (ref 70–99)
Glucose-Capillary: 190 mg/dL — ABNORMAL HIGH (ref 70–99)
Glucose-Capillary: 124 mg/dL — ABNORMAL HIGH (ref 70–99)
Glucose-Capillary: 133 mg/dL — ABNORMAL HIGH (ref 70–99)

## 2018-11-11 MED ORDER — SODIUM CHLORIDE 0.9% FLUSH: 3.0000 mL | INTRAVENOUS | Status: DC | PRN

## 2018-11-11 MED ORDER — NEPRO/CARBSTEADY PO LIQD
237.0000 mL | ORAL | Status: DC
  Administered 2018-11-11 – 2018-11-19 (×6): 237 mL via ORAL

## 2018-11-11 MED ORDER — CHLORHEXIDINE GLUCONATE CLOTH 2 % EX PADS
6.0000 | MEDICATED_PAD | Freq: Every day | CUTANEOUS | Status: DC
  Administered 2018-11-11 – 2018-11-18 (×5): 6 via TOPICAL

## 2018-11-11 MED ORDER — SODIUM CHLORIDE 0.9 % IV SOLN
INTRAVENOUS | Status: DC
  Administered 2018-11-12: 07:00:00 via INTRAVENOUS

## 2018-11-11 NOTE — Progress Notes (Signed)
PROGRESS NOTE    [name]  ZOX:[id number] DOB: [birth year] DOA: 11/06/2018 PCP: Doree Albee, MD     Brief Narrative:  78 y.o. female with medical history significant for progressive renal insufficiency, gout, NSTEMI, DM 2, hypertension, hypothyroidism, who presented to the ED with complaints of Central chest pain that started this morning 4 AM in the morning while she was trying to sleep.  Chest pain is nonradiating and associated with difficulty breathing.  No associated nausea or vomiting.  Chest pain persisted so she came to the ED.  No known aggravating or relieving factors.  Patient found with acute on chronic renal failure, acute on chronic diastolic heart failure, worsening shortness of breath and end STEMI.  She has been admitted for further evaluation and management.   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 -Increased shortness of breath and imaging studies suggesting vascular congestion and interstitial edema. -Patient tolerated well first and second dialysis session (6/12, 6/13); Third treatment completed and well tolerated on 6/15.  Will follow nephrology further recommendations. -clipping process to secure outpatient hemodialysis spot in progress (so far appears to have anticipated spot for T-T-S). -Follow clinical response  2-chest pain/NSTEMI -Patient with underlying history of coronary artery disease medically manage due to chronic renal failure and inability for heart cath. -Pain improve/resolved with the use of nitroglycerin on admission. -Troponin has peaked at 4.14 -Cardiology service has been consulted for further recommendation -Will continue the use of beta-blocker, Plavix and aspirin. -Plan is for the patient to be transferred to [facility] with anticipated heart cath on 11/12/2018.   3-hypothyroidism -Continue Armour  4-gastroesophageal reflux disease -Continue PPI.  5-type 2 diabetes mellitus -Continue sliding scale insulin and follow CBGs. -So far well controlled.  6-essential hypertension -Continue current medication -Blood pressure stable and overall well-controlled.  7-concerns for pneumonia -Patient has remained afebrile, normal WBCs, no complaining of coughing spells. -Breathing progressively improving while taking fluid off with dialysis treatment.   -Repeat x-ray on 6/16 demonstrating bilateral pleural effusion and atelectasis. -Will continue monitoring patient off antibiotics -Continue to focus on volume control and relieve her vascular congestion.  8-acute on chronic diastolic heart failure -Follow daily weights -Continue volume stabilization with dialysis treatment. -Follow low-sodium diet -Strict I's and O's. -Follow clinical response. -2D echo demonstrating preserved ejection fraction and grade 1 diastolic dysfunction.  9-hyperkalemia -Kayexalate given at time of admission -Further electrolytes and stabilization to be accomplished with hemodialysis -We will continue to follow nephrology recommendations. -Potassium within normal limits at this time.  10-secondary hyperparathyroidism -continue Phoslo  11-anemia of chronic renal failure -No signs of operative bleeding -Continue IV iron and Epogen therapy as per renal discretion -Hemoglobin 8.7 currently.  DVT prophylaxis: Heparin Code Status: Full code Family Communication: No family at bedside. Disposition Plan: Remains inpatient; patient will be transferred to [facility] telemetry bed for anticipated heart cath on 11/12/2018.  Continue dialysis as per nephrology recommendations and follow further recommendation by cardiology.   Consultants:   Cardiology  Nephrology  Procedures:   See below for x-ray reports.  2D echo: ejection fraction 81-19%, grade 1 diastolic  dysfunction.  Left heart cath anticipated for 11/12/2018  Antimicrobials:  Anti-infectives (From admission, onward)   Start     Dose/Rate Route Frequency Ordered Stop   11/07/18 2200  ceFEPIme (MAXIPIME) 1 g in sodium chloride 0.9 %  100 mL IVPB  Status:  Discontinued     1 g 200 mL/hr over 30 Minutes Intravenous Every 24 hours 11/07/18 1209 11/07/18 1227   11/07/18 1800  vancomycin (VANCOCIN) IVPB 750 mg/150 ml premix  Status:  Discontinued     750 mg 150 mL/hr over 60 Minutes Intravenous  Once 11/07/18 1214 11/07/18 1227   11/07/18 0745  vancomycin (VANCOCIN) 1,500 mg in sodium chloride 0.9 % 500 mL IVPB     1,500 mg 250 mL/hr over 120 Minutes Intravenous  Once 11/07/18 0735 11/07/18 1044   11/07/18 0730  ceFEPIme (MAXIPIME) 1 g in sodium chloride 0.9 % 100 mL IVPB  Status:  Discontinued     1 g 200 mL/hr over 30 Minutes Intravenous Every 24 hours 11/07/18 0726 11/07/18 1209   11/07/18 0730  vancomycin (VANCOCIN) IVPB 1000 mg/200 mL premix  Status:  Discontinued     1,000 mg 200 mL/hr over 60 Minutes Intravenous  Once 11/07/18 0728 11/07/18 0735   11/06/18 2200  sulfamethoxazole-trimethoprim (BACTRIM) 400-80 MG per tablet 1 tablet  Status:  Discontinued     1 tablet Oral Daily at 10 pm 11/06/18 2106 11/07/18 1227      Subjective: No fever, no coughing, no chest pain currently.  Still requiring oxygen supplementation through nasal cannula but down to 4 L.   Objective: Vitals:   11/11/18 0800 11/11/18 0900 11/11/18 1000 11/11/18 1120  BP: (!) 165/63  (!) 154/49   Pulse: 67 64 63 76  Resp: 18 15 (!) 22 19  Temp:    98.4 F (36.9 C)  TempSrc:    Oral  SpO2: 93% 97% 97% 97%  Weight:      Height:        Intake/Output Summary (Last 24 hours) at 11/11/2018 1358 Last data filed at 11/11/2018 0942 Gross per 24 hour  Intake 240 ml  Output 800 ml  Net -560 ml   Filed Weights   11/09/18 0500 11/10/18 0500 11/10/18 1400  Weight: 79.6 kg 78.7 kg 78.9 kg    Examination: General  exam: Alert, awake, oriented x 3, no CP, no SOB, no nausea, no vomiting, continue to have improvement in her breathing and appetite; HFNC down to 4L.  No fever. Respiratory system: Normal respiratory effort, no using accessory muscle.  Decreased breath sounds at the bases. Cardiovascular system:RRR. No murmurs, rubs, gallops. Gastrointestinal system: Abdomen is nondistended, soft and nontender. No organomegaly or masses felt. Normal bowel sounds heard. Central nervous system: Alert and oriented. No focal neurological deficits. Extremities: No cyanosis or clubbing; no lower extremity edema.  Right upper extremity with positive thrill on aVF. Skin: No rashes, no petechiae.  Healing dry wound with callus on the sole of her right lower extremity without signs of superimposed infection.  No pain reported. Psychiatry: Judgement and insight appear normal. Mood & affect appropriate.    Data Reviewed: I have personally reviewed following labs and imaging studies  CBC: Recent Labs  Lab 11/06/18 0926 11/08/18 0433 11/09/18 0439 11/10/18 0429 11/11/18 0519  WBC 10.7* 8.9 6.6 6.3 6.2  NEUTROABS 9.0* 7.2 4.9 4.4 4.0  HGB 9.0* 8.3* 8.4* 8.6* 8.7*  HCT 29.0* 27.3* 26.9* 28.3* 28.5*  MCV 98.6 99.6 97.1 99.0 98.3  PLT 225 182 186 223 841   Basic Metabolic Panel: Recent Labs  Lab 11/06/18 0926 11/07/18 0453 11/08/18 0433 11/10/18 0429  NA 139 139 139 138  K 5.4* 5.1 4.6 4.2  CL 104 107 101 99  CO2 17*  18* 24 24  GLUCOSE 108* 112* 97 99  BUN 100* 103* 64* 64*  CREATININE 6.82* 7.10* 4.64* 4.43*  4.27*  CALCIUM 8.5* 8.1* 8.1* 7.9*  PHOS  --   --  5.6* 6.2*   GFR: Estimated Creatinine Clearance: 11 mL/min (A) (by C-G formula based on SCr of 4.27 mg/dL (H)).  Cardiac Enzymes: Recent Labs  Lab 11/06/18 1253 11/06/18 1848 11/07/18 0033 11/07/18 0453 11/07/18 1125  TROPONINI 0.04* 0.05* 0.05* 0.05* 4.14*   CBG: Recent Labs  Lab 11/10/18 1135 11/10/18 1615 11/10/18 2046 11/11/18  0725 11/11/18 1118  GLUCAP 156* 120* 170* 97 190*   Urine analysis:    Component Value Date/Time   COLORURINE YELLOW 03/09/2017 0801   APPEARANCEUR CLEAR 03/09/2017 0801   LABSPEC 1.013 03/09/2017 0801   PHURINE 5.0 03/09/2017 0801   GLUCOSEU 50 (A) 03/09/2017 0801   HGBUR NEGATIVE 03/09/2017 0801   BILIRUBINUR NEGATIVE 03/09/2017 0801   KETONESUR NEGATIVE 03/09/2017 0801   PROTEINUR 30 (A) 03/09/2017 0801   UROBILINOGEN 0.2 02/08/2015 1615   NITRITE NEGATIVE 03/09/2017 0801   LEUKOCYTESUR NEGATIVE 03/09/2017 0801    Recent Results (from the past 240 hour(s))  SARS Coronavirus 2 (CEPHEID - Performed in [place] hospital lab), Hosp Order     Status: None   Collection Time: 11/06/18  1:30 PM   Specimen: Nasopharyngeal Swab  Result Value Ref Range Status   SARS Coronavirus 2 NEGATIVE NEGATIVE Final    Comment: (NOTE) If result is NEGATIVE SARS-CoV-2 target nucleic acids are NOT DETECTED. The SARS-CoV-2 RNA is generally detectable in upper and lower  respiratory specimens during the acute phase of infection. The lowest  concentration of SARS-CoV-2 viral copies this assay can detect is 250  copies / mL. A negative result does not preclude SARS-CoV-2 infection  and should not be used as the sole basis for treatment or other  patient management decisions.  A negative result may occur with  improper specimen collection / handling, submission of specimen other  than nasopharyngeal swab, presence of viral mutation(s) within the  areas targeted by this assay, and inadequate number of viral copies  (<250 copies / mL). A negative result must be combined with clinical  observations, patient history, and epidemiological information. If result is POSITIVE SARS-CoV-2 target nucleic acids are DETECTED. The SARS-CoV-2 RNA is generally detectable in upper and lower  respiratory specimens dur ing the acute phase of infection.  Positive  results are indicative of active infection with  SARS-CoV-2.  Clinical  correlation with patient history and other diagnostic information is  necessary to determine patient infection status.  Positive results do  not rule out bacterial infection or co-infection with other viruses. If result is PRESUMPTIVE POSTIVE SARS-CoV-2 nucleic acids MAY BE PRESENT.   A presumptive positive result was obtained on the submitted specimen  and confirmed on repeat testing.  While 2019 novel coronavirus  (SARS-CoV-2) nucleic acids may be present in the submitted sample  additional confirmatory testing may be necessary for epidemiological  and / or clinical management purposes  to differentiate between  SARS-CoV-2 and other Sarbecovirus currently known to infect humans.  If clinically indicated additional testing with an alternate test  methodology [phone]) is advised. The SARS-CoV-2 RNA is generally  detectable in upper and lower respiratory sp ecimens during the acute  phase of infection. The expected result is Negative. Fact Sheet for Patients:  StrictlyIdeas.no Fact Sheet for Healthcare Providers: BankingDealers.co.za This test is not yet approved or cleared by the  [place] Architectural technologist and has been authorized for detection and/or diagnosis of SARS-CoV-2 by FDA under an Print production planner (EUA).  This EUA will remain in effect (meaning this test can be used) for the duration of the COVID-19 declaration under Section 564(b)(1) of the Act, 21 U.S.C. section 360bbb-3(b)(1), unless the authorization is terminated or revoked sooner. Performed at [facility], [address], [place], [place] [address]   MRSA PCR Screening     Status: None   Collection Time: 11/07/18  6:09 AM   Specimen: Nasal Mucosa; Nasopharyngeal  Result Value Ref Range Status   MRSA by PCR NEGATIVE NEGATIVE Final    Comment:        The GeneXpert MRSA Assay (FDA approved for NASAL specimens only), is one component of a  comprehensive MRSA colonization surveillance program. It is not intended to diagnose MRSA infection nor to guide or monitor treatment for MRSA infections. Performed at [facility], [address] [place], [place] [address]   Culture, blood (routine x 2)     Status: None (Preliminary result)   Collection Time: 11/07/18  7:38 AM   Specimen: Right Antecubital; Blood  Result Value Ref Range Status   Specimen Description   Final    RIGHT ANTECUBITAL BOTTLES DRAWN AEROBIC AND ANAEROBIC   Special Requests Blood Culture adequate volume  Final   Culture   Final    NO GROWTH 4 DAYS Performed at [facility], [address] [place], [place] [address]    Report Status PENDING  Incomplete  Culture, blood (routine x 2)     Status: None (Preliminary result)   Collection Time: 11/07/18  7:38 AM   Specimen: BLOOD LEFT FOREARM  Result Value Ref Range Status   Specimen Description   Final    BLOOD LEFT FOREARM BOTTLES DRAWN AEROBIC AND ANAEROBIC   Special Requests Blood Culture adequate volume  Final   Culture   Final    NO GROWTH 4 DAYS Performed at [facility], [address], [place], [place] [address]    Report Status PENDING  Incomplete     Radiology Studies: Dg Chest 2 View  Result Date: 11/11/2018 CLINICAL DATA:  78 year old female with a history of weakness EXAM: CHEST - 2 VIEW COMPARISON:  CT 11/07/2018, plain film 11/06/2018 FINDINGS: Cardiomediastinal silhouette unchanged in size and contour. Improved aeration at the right lung base with blunting of the right costophrenic angle and cardiophrenic angle. Persistent opacity at the left lung base obscuring the left hemidiaphragm with blunting at the left costophrenic angle. Meniscus at the bilateral lung bases on the lateral view. No pneumothorax. Coarsened interstitial markings throughout. No interlobular septal thickening. Surgical changes of the left humerus. Degenerative changes of the spine. No displaced fracture IMPRESSION: Bilateral  pleural effusions, larger on the left, with associated atelectasis/consolidation. Electronically Signed   By: Corrie Mckusick D.O.   On: 11/11/2018 13:01    Scheduled Meds: . aspirin EC  81 mg Oral Daily  . calcium acetate  667 mg Oral TID WC  . Chlorhexidine Gluconate Cloth  6 each Topical Q0600  . Chlorhexidine Gluconate Cloth  6 each Topical Q0600  . ciprofloxacin-dexamethasone  4 drop Left EAR BID  . clopidogrel  75 mg Oral Daily  . darbepoetin (ARANESP) injection - DIALYSIS  100 mcg Intravenous Q Mon-HD  . docusate sodium  100 mg Oral Q12H  . febuxostat  40 mg Oral Daily  . gabapentin  300 mg Oral QHS  . heparin  5,000 Units  Subcutaneous Q8H  . hydrALAZINE  37.5 mg Oral BID  . insulin aspart  0-5 Units Subcutaneous QHS  . insulin aspart  0-9 Units Subcutaneous TID WC  . mouth rinse  15 mL Mouth Rinse BID  . metoprolol tartrate  12.5 mg Oral BID  . pantoprazole  40 mg Oral QAC breakfast  . thyroid  120 mg Oral Q0600   Continuous Infusions:    LOS: 4 days    Time spent: 30 minutes.    Barton Dubois, MD [facility] Pager [phone]   11/11/2018, 1:58 PM

## 2018-11-11 NOTE — Progress Notes (Signed)
Pt. [facility] from [facility]. Pt. In alert and stable condition. No distress noted. Pt. Placed on telemetry CCMD notified. Call light placed within reach. Pt. Oriented to room. Pt. Told to call for assistance with ambulating. Admitting paged to inform of pts. Arrival to the unit.

## 2018-11-11 NOTE — H&P (View-Only) (Signed)
Progress Note  Patient Name: [name] Date of Encounter: 11/11/2018  Primary Cardiologist: Carlyle D[place], MD   Subjective   No complaints  Inpatient Medications    Scheduled Meds: . aspirin EC  81 mg Oral Daily  . calcium acetate  667 mg Oral TID WC  . Chlorhexidine Gluconate Cloth  6 each Topical Q0600  . ciprofloxacin-dexamethasone  4 drop Left EAR BID  . clopidogrel  75 mg Oral Daily  . darbepoetin (ARANESP) injection - DIALYSIS  100 mcg Intravenous Q Mon-HD  . docusate sodium  100 mg Oral Q12H  . febuxostat  40 mg Oral Daily  . gabapentin  300 mg Oral QHS  . heparin  5,000 Units Subcutaneous Q8H  . hydrALAZINE  37.5 mg Oral BID  . insulin aspart  0-5 Units Subcutaneous QHS  . insulin aspart  0-9 Units Subcutaneous TID WC  . mouth rinse  15 mL Mouth Rinse BID  . metoprolol tartrate  12.5 mg Oral BID  . pantoprazole  40 mg Oral QAC breakfast  . thyroid  120 mg Oral Q0600   Continuous Infusions: . sodium chloride    . sodium chloride     PRN Meds: sodium chloride, sodium chloride, acetaminophen **OR** acetaminophen, heparin, lidocaine (PF), lidocaine-prilocaine, nitroGLYCERIN, ondansetron **OR** ondansetron (ZOFRAN) IV, pentafluoroprop-tetrafluoroeth, polyethylene glycol   Vital Signs    Vitals:   11/11/18 0400 11/11/18 0500 11/11/18 0600 11/11/18 0729  BP: (!) 148/56  (!) 131/46   Pulse: (!) 57 (!) 59 60 64  Resp: 16 15 (!) 8 19  Temp:    98.7 F (37.1 C)  TempSrc:    Oral  SpO2: 99% 92% 92% 96%  Weight:      Height:        Intake/Output Summary (Last 24 hours) at 11/11/2018 1010 Last data filed at 11/11/2018 0942 Gross per 24 hour  Intake 240 ml  Output 800 ml  Net -560 ml   Last 3 Weights 11/10/2018 11/10/2018 11/09/2018  Weight (lbs) 173 lb 15.1 oz 173 lb 8 oz 175 lb 7.8 oz  Weight (kg) 78.9 kg 78.7 kg 79.6 kg      Telemetry    SR - Personally Reviewed  ECG    na - Personally Reviewed  Physical Exam   GEN: No acute distress.    Neck: No JVD Cardiac: RRR, no murmurs, rubs, or gallops.  Respiratory: Clear to auscultation bilaterally. GI: Soft, nontender, non-distended  MS: No edema; No deformity. Neuro:  Nonfocal  Psych: Normal affect   Labs    Chemistry Recent Labs  Lab 11/07/18 0453 11/08/18 0433 11/10/18 0429  NA 139 139 138  K 5.1 4.6 4.2  CL 107 101 99  CO2 18* 24 24  GLUCOSE 112* 97 99  BUN 103* 64* 64*  CREATININE 7.10* 4.64* 4.43*  4.27*  CALCIUM 8.1* 8.1* 7.9*  ALBUMIN  --  3.0* 2.6*  GFRNONAA 5* 8* 9*  9*  GFRAA 6* 10* 10*  11*  ANIONGAP 14 14 15      Hematology Recent Labs  Lab 11/09/18 0439 11/10/18 0429 11/11/18 0519  WBC 6.6 6.3 6.2  RBC 2.77* 2.86* 2.90*  HGB 8.4* 8.6* 8.7*  HCT 26.9* 28.3* 28.5*  MCV 97.1 99.0 98.3  MCH 30.3 30.1 30.0  MCHC 31.2 30.4 30.5  RDW 14.4 14.4 14.0  PLT 186 223 245    Cardiac Enzymes Recent Labs  Lab 11/06/18 1848 11/07/18 0033 11/07/18 0453 11/07/18 1125  TROPONINI 0.05* 0.05* 0.05*  4.14*   No results for input(s): TROPIPOC in the last 168 hours.   BNPNo results for input(s): BNP, PROBNP in the last 168 hours.   DDimer  Recent Labs  Lab 11/06/18 0926  DDIMER 1.77*     Radiology    No results found.  Cardiac Studies    Patient Profile     Briana Speakman Croteauis a 78 y.o.femalewith a hx of NSTEMI rx medically due to renal dz and anemia, on DAPT>>stopped 2nd anemia, intol statins, no ACE/ARB 2nd poor renal function, EF 50-55% on echo 2018, GIB req transfusions, HTN, CKD nearing HD, hypothyroid, gout,who is being seen today for the evaluation ofNSTEMIat the request of Dr Dyann Kief.  Assessment & Plan    1. NSTEMI - multiple prior admissions with NSTEMI in the setting of severe anemia - - her initial NSTEMIadmission was at [facility] treated medically including with DAPT, primarily due to advanced renal dysfunction and anemia atht time. Peak troponin 8.5. - readmitted 10/2016 with Hgb of 4.4 , elevated troponin again.  DAPT stopped. Peak trop 1.8. - Her renal function and recurrent anemia had prohibited ischemic testing in the past. - 07/2017 admit with dark stools and anemia, off DAPT temporarily  - this admit recurrent NSTEMI with evidence of fluid overload and worsening renal failure. Trop up to 4. Symptoms much improved with fluid removal with HD - medical therapy with ASA, plavix 75, lopressor 12.5mg  bid. She has statin allergy - repeat echo LVE 62-22%, grade I diastolic dysfunction  - she is now established on HD this admission - plan for cath once respiratory status has further improved, making significant progress since starting HD and also on abx for pneumonia - down to 3L [place] today, I think she would be fine for cath tomorrow.    2. CKD - started on HD newly this admission    3. Pneumonia - per primary team  4. Anemia - Prior issues with anemia, Hgb stable this admission   I have reviewed the risks, indications, and alternatives to cardiac catheterization, possible angioplasty, and stenting with the patient today. Risks include but are not limited to bleeding, infection, vascular injury, stroke, myocardial infection, arrhythmia, kidney injury, radiation-related injury in the case of prolonged fluoroscopy use, emergency cardiac surgery, and death. The patient understands the risks of serious complication is 1-2 in [id number] with diagnostic cardiac cath and 1-2% or less with angioplasty/stenting.   For questions or updates, please contact [place] Please consult [email or web address] for contact info under        Signed, Carlyle Dolly, MD  11/11/2018, 10:10 AM

## 2018-11-11 NOTE — TOC Progression Note (Addendum)
Transition of Care [facility]) - Progression Note    Patient Details  Name: [name] MRN: [id number] Date of Birth: 07-10-1940  Transition of [facility]) CM/SW Contact  Shade Flood, LCSW Phone Number: 11/11/2018, 11:33 AM  Clinical Narrative:     TOC following. Spoke with Reginold Agent at [facility] today for an update on pt's chair time. Per Reginold Agent, pt is being given a TRS 11:30 schedule at this time. Updated pt and her caregiver. They are in agreement with the plan.  Cardiology notes indicate pt likely will be stable for cardiac cath tomorrow. Pt will transfer to [facility] either today or tomorrow for that care. [facility] TOC team member can continue to work on pt's outpatient dialysis needs. Per Reginold Agent, she needs to be called with an update of when pt needs her first outpatient treatment. If pt is not discharged in time to start on Thursday this week, they can only start her on Saturday if she is able to go to [facility] on Friday to sign consents.  Pt has been on O2 here and was not on Home O2. Watch for O2 needs and potentially HH RN.  Updated MD. Donella Stade will follow.  Expected Discharge Plan: Home/Self Care Barriers to Discharge: Continued Medical Work up  Expected Discharge Plan and Services Expected Discharge Plan: Home/Self Care In-house Referral: Clinical Social Work   Post Acute Care Choice: Dialysis Living arrangements for the past 2 months: Single Family Home                                       Social Determinants of Health (SDOH) Interventions    Readmission Risk Interventions No flowsheet data found.

## 2018-11-11 NOTE — Progress Notes (Signed)
Patient alert and oriented x4. No complaints of pain, shortness of breath, chest pain, dizziness, nausea or vomiting. Patient sitting up in chair and ambulatory with x1 assist to commode. Gait unsteady prompting the need for the 1 assist. Patient tolerated meds and diet well, appetite good. Nurse to nurse report called and given to [place] at [facility] on the [place] unit. Patient to be transferred to [facility] via [facility] when they are available. Patient called contact(s) and made them aware.

## 2018-11-11 NOTE — Progress Notes (Signed)
[facility] ROUNDING NOTE   Subjective:   Brief history 78 year old female with history of coronary artery disease status post non-STEMI.  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  However last 2D echo 10/17/2017 showed an ejection fraction of 60 to 65%.  She has a 2D echo scheduled for 11/10/2018 appreciate assistance from Dr. Harl Bowie.  Patient was admitted with uremic signs and symptoms 11/06/2018  Patient is followed by Dr. Lowanda Foster and had right AV fistula placed January 2020.  Dialysis #1 11/07/2018 Dialysis #2 11/08/2018 Dialysis #3 11/10/2018  Blood pressure 150/8063 temperature 98.7 O2 sats 100%   Urine output 100 cc 11/10/2018 ultrafiltration 500 cc 11/10/2018  Medications aspirin 81 mg daily PhosLo 667 mg 3 times daily with meals Plavix 75 mg daily, Colace 100 mg twice daily, Uloric 40 mg daily, Neurontin 300 mg daily, hydralazine 37.5 mg twice daily, insulin sliding scale, metoprolol 12.5 mg twice daily, Protonix 40 mg daily, sodium bicarbonate 650 mg twice daily, thyroid tablet 120 mg daily  Hemoglobin 8.7     11/10/2018 labs sodium 139 potassium 4.6 chloride 101 CO2 24 BUN 64 creatinine 4.64 glucose 97 calcium 8.1 phosphorus 5.6 albumin 3.0   iron saturations 22% 10/30/2018   TSH 0.326 11/06/2018   PTH 114 10/29/2018  Blood cultures negative  Chest CT multifocal airspace consolidation atelectasis cardiac enlargement 3 vessel coronary artery calcifications pulmonary edema bilateral effusions aortic atherosclerosis 11/07/2018  VQ scan negative  Objective:  Vital signs in last 24 hours:  Temp:  [97.9 F (36.6 C)-98.7 F (37.1 C)] 98.7 F (37.1 C) (06/16 0729) Pulse Rate:  [57-134] 64 (06/16 0729) Resp:  [7-23] 19 (06/16 0729) BP: (122-167)/(46-115) 131/46 (06/16 0600) SpO2:  [92 %-99 %] 96 % (06/16 0729) Weight:  [78.9 kg] 78.9 kg (06/15 1400)  Weight change: 0.2 kg Filed Weights   11/09/18 0500 11/10/18 0500 11/10/18  1400  Weight: 79.6 kg 78.7 kg 78.9 kg    Intake/Output: I/O last 3 completed shifts: In: 700 [P.O.:700] Out: 600 [Urine:100; Other:500]   Intake/Output this shift:  Total I/O In: -  Out: 300 [Urine:300]  CVS- RRR  no JVP no murmurs rubs or gallops RS- CTA  ABD- BS present soft non-distended external urinary catheter EXT- no edema   right AV fistula      Basic Metabolic Panel: Recent Labs  Lab 11/06/18 0926 11/07/18 0453 11/08/18 0433 11/10/18 0429  NA 139 139 139 138  K 5.4* 5.1 4.6 4.2  CL 104 107 101 99  CO2 17* 18* 24 24  GLUCOSE 108* 112* 97 99  BUN 100* 103* 64* 64*  CREATININE 6.82* 7.10* 4.64* 4.43*  4.27*  CALCIUM 8.5* 8.1* 8.1* 7.9*  PHOS  --   --  5.6* 6.2*    Liver Function Tests: Recent Labs  Lab 11/08/18 0433 11/10/18 0429  ALBUMIN 3.0* 2.6*   No results for input(s): LIPASE, AMYLASE in the last 168 hours. No results for input(s): AMMONIA in the last 168 hours.  CBC: Recent Labs  Lab 11/06/18 0926 11/08/18 0433 11/09/18 0439 11/10/18 0429 11/11/18 0519  WBC 10.7* 8.9 6.6 6.3 6.2  NEUTROABS 9.0* 7.2 4.9 4.4 4.0  HGB 9.0* 8.3* 8.4* 8.6* 8.7*  HCT 29.0* 27.3* 26.9* 28.3* 28.5*  MCV 98.6 99.6 97.1 99.0 98.3  PLT 225 182 186 223 245    Cardiac Enzymes: Recent Labs  Lab 11/06/18 1253 11/06/18 1848 11/07/18 0033 11/07/18 0453 11/07/18 1125  TROPONINI 0.04* 0.05* 0.05* 0.05* 4.14*  BNP: Invalid input(s): POCBNP  CBG: Recent Labs  Lab 11/10/18 0732 11/10/18 1135 11/10/18 1615 11/10/18 2046 11/11/18 0725  GLUCAP 106* 156* 120* 170* 97    Microbiology: Results for orders placed or performed during the hospital encounter of 11/06/18  SARS Coronavirus 2 (CEPHEID - Performed in [facility] hospital lab), Hosp Order     Status: None   Collection Time: 11/06/18  1:30 PM   Specimen: Nasopharyngeal Swab  Result Value Ref Range Status   SARS Coronavirus 2 NEGATIVE NEGATIVE Final    Comment: (NOTE) If result is  NEGATIVE SARS-CoV-2 target nucleic acids are NOT DETECTED. The SARS-CoV-2 RNA is generally detectable in upper and lower  respiratory specimens during the acute phase of infection. The lowest  concentration of SARS-CoV-2 viral copies this assay can detect is 250  copies / mL. A negative result does not preclude SARS-CoV-2 infection  and should not be used as the sole basis for treatment or other  patient management decisions.  A negative result may occur with  improper specimen collection / handling, submission of specimen other  than nasopharyngeal swab, presence of viral mutation(s) within the  areas targeted by this assay, and inadequate number of viral copies  (<250 copies / mL). A negative result must be combined with clinical  observations, patient history, and epidemiological information. If result is POSITIVE SARS-CoV-2 target nucleic acids are DETECTED. The SARS-CoV-2 RNA is generally detectable in upper and lower  respiratory specimens dur ing the acute phase of infection.  Positive  results are indicative of active infection with SARS-CoV-2.  Clinical  correlation with patient history and other diagnostic information is  necessary to determine patient infection status.  Positive results do  not rule out bacterial infection or co-infection with other viruses. If result is PRESUMPTIVE POSTIVE SARS-CoV-2 nucleic acids MAY BE PRESENT.   A presumptive positive result was obtained on the submitted specimen  and confirmed on repeat testing.  While 2019 novel coronavirus  (SARS-CoV-2) nucleic acids may be present in the submitted sample  additional confirmatory testing may be necessary for epidemiological  and / or clinical management purposes  to differentiate between  SARS-CoV-2 and other Sarbecovirus currently known to infect humans.  If clinically indicated additional testing with an alternate test  methodology [phone]) is advised. The SARS-CoV-2 RNA is generally  detectable  in upper and lower respiratory sp ecimens during the acute  phase of infection. The expected result is Negative. Fact Sheet for Patients:  StrictlyIdeas.no Fact Sheet for Healthcare Providers: BankingDealers.co.za This test is not yet approved or cleared by the [place] FDA and has been authorized for detection and/or diagnosis of SARS-CoV-2 by FDA under an Emergency Use Authorization (EUA).  This EUA will remain in effect (meaning this test can be used) for the duration of the COVID-19 declaration under Section 564(b)(1) of the Act, 21 U.S.C. section 360bbb-3(b)(1), unless the authorization is terminated or revoked sooner. Performed at [facility], [address] [place], [place] [address]   MRSA PCR Screening     Status: None   Collection Time: 11/07/18  6:09 AM   Specimen: Nasal Mucosa; Nasopharyngeal  Result Value Ref Range Status   MRSA by PCR NEGATIVE NEGATIVE Final    Comment:        The GeneXpert MRSA Assay (FDA approved for NASAL specimens only), is one component of a comprehensive MRSA colonization surveillance program. It is not intended to diagnose MRSA infection nor to guide or monitor treatment for MRSA infections.  Performed at [facility], [address] [place], [place] [address]   Culture, blood (routine x 2)     Status: None (Preliminary result)   Collection Time: 11/07/18  7:38 AM   Specimen: Right Antecubital; Blood  Result Value Ref Range Status   Specimen Description   Final    RIGHT ANTECUBITAL BOTTLES DRAWN AEROBIC AND ANAEROBIC   Special Requests Blood Culture adequate volume  Final   Culture   Final    NO GROWTH 4 DAYS Performed at [facility], [address], [place], [place] [address]    Report Status PENDING  Incomplete  Culture, blood (routine x 2)     Status: None (Preliminary result)   Collection Time: 11/07/18  7:38 AM   Specimen: BLOOD LEFT FOREARM  Result Value Ref Range Status    Specimen Description   Final    BLOOD LEFT FOREARM BOTTLES DRAWN AEROBIC AND ANAEROBIC   Special Requests Blood Culture adequate volume  Final   Culture   Final    NO GROWTH 4 DAYS Performed at [facility], [address] [place], [place] [address]    Report Status PENDING  Incomplete    Coagulation Studies: No results for input(s): LABPROT, INR in the last 72 hours.  Urinalysis: No results for input(s): COLORURINE, LABSPEC, PHURINE, GLUCOSEU, HGBUR, BILIRUBINUR, KETONESUR, PROTEINUR, UROBILINOGEN, NITRITE, LEUKOCYTESUR in the last 72 hours.  Invalid input(s): APPERANCEUR    Imaging: No results found.   Medications:   . sodium chloride    . sodium chloride     . aspirin EC  81 mg Oral Daily  . calcium acetate  667 mg Oral TID WC  . Chlorhexidine Gluconate Cloth  6 each Topical Q0600  . ciprofloxacin-dexamethasone  4 drop Left EAR BID  . clopidogrel  75 mg Oral Daily  . darbepoetin (ARANESP) injection - DIALYSIS  100 mcg Intravenous Q Mon-HD  . docusate sodium  100 mg Oral Q12H  . febuxostat  40 mg Oral Daily  . gabapentin  300 mg Oral QHS  . heparin  5,000 Units Subcutaneous Q8H  . hydrALAZINE  37.5 mg Oral BID  . insulin aspart  0-5 Units Subcutaneous QHS  . insulin aspart  0-9 Units Subcutaneous TID WC  . mouth rinse  15 mL Mouth Rinse BID  . metoprolol tartrate  12.5 mg Oral BID  . pantoprazole  40 mg Oral QAC breakfast  . thyroid  120 mg Oral Q0600   sodium chloride, sodium chloride, acetaminophen **OR** acetaminophen, heparin, lidocaine (PF), lidocaine-prilocaine, nitroGLYCERIN, ondansetron **OR** ondansetron (ZOFRAN) IV, pentafluoroprop-tetrafluoroeth, polyethylene glycol  Assessment/ Plan:   End-stage renal disease admitted for uremic signs and symptoms initiated dialysis this will be her third dialysis treatment 11/10/2018.  Clip process in place.  Plan dialysis 11/12/2018  Congestive heart failure diastolic dysfunction last 2D echo current 2D echo  pending  Metabolic acidosis resolved will discontinue sodium bicarbonate  Coronary artery disease continues Plavix and aspirin  Hypertension appears to be adequately controlled continue ultrafiltration for volume overload  Foot ulcer continue wound care no antibiotics at this present time will need podiatry  Diabetes mellitus controlled  Peripheral neuropathy stable with gabapentin  Gastroesophageal reflux disease continues Protonix.  Anemia iron stores started darbepoetin 100 mcg q. Monday  Bones continues on binders PTH within goal.   LOS: [place] @TODAY @10 :28 AM

## 2018-11-11 NOTE — Plan of Care (Signed)
Nutrition Education Note  RD consulted for Renal Education. Pt begun dialysis this admission.   Met with pt shortly after lunch. She says she has not received any prior information regarding HD or the diet she will be asked to follow. However, she says she has read some things on her own, but her existing knowledge is minimal.   RD reviewed the basic functions of the kidney and how they help regulate certain solutes/fluid and when kidneys stop working these solutes will build up in body. Specifically reviewed K, phos and Na and the physiologic consequences that occur with high levels of these minerals in the body ie heart arrhythmias, renal osteodystrophy and fluid overload, respectively.   RD took diet recall to try to help tailor diet education, however pt was quick to get off topic and was unable to keep her focused on food. Was not able to gather much insight into her current diet. However, she did say that in general she eats very little dairy, fruits, vegetables. She says she reads labels to look for carbs (is DM), but she does not truly sound to follow a DM diet (last a1c was 1 yr ago and was 6.1)  RD went through a handout listing common foods that are high in K, phos and Na. For potassium, RD specifically discussed OJ, tomatoes, potatoes, leafy greens and dairy. For potatoes, RD briefly described the leaching process. For Phosphorus, RD brought up legumes, chocolate, dairy and colas. For Na, RD recommended avoidance of processed/canned foods and frozen meals. She does use the salt shaker on some select items. RD recommended reducing salt as much as possible. RD reviewed "hidden sources" of salt- dressings and other condiments.   RD then reviewed fluid guidelines. She says she really does not take much fluid in. In fact, she says she gets most of her hydration from chewing on crushed ice. She thought the 1.2 L restriction sounded like a lot.   Discussed importance of protein intake at each meal  and snack. She says her favorite food is hamburgers. We discussed how to make this an appropriate meal. She also eats a lot of eggs. Other than these two items, diet is fairly low in protein. RD recommended oral supplements. Reviewed nepro, zone bars and novasource renal as potential options. She was agreeable to trying Nepro. RD noted her energy and protein needs are higher now that she is on HD. Encouraged her to eat and to push herself a little to take an extra bite or two- noted HD can take away appetite, but she still needs to eat.   Encouraged pt to discuss specific diet questions/concerns with RD at HD outpatient facility.  Expect fair-good compliance. Pt says she has a caregiver at home who is nutritionally aware and will be able to help her follow the diet. She seems motivated to follow the diet, but she certainly will need further instruction, as she is quick to get off topic.   RD recommended following up closely with RD at HD center.  No further nutrition interventions warranted at this time. If additional nutrition issues arise, please re-consult RD.  Burtis Junes RD, LDN, CNSC Clinical Nutrition Available Tues-Sat via Pager: [phone] 11/11/2018 2:01 PM

## 2018-11-11 NOTE — Progress Notes (Addendum)
Progress Note  Patient Name: [name] Date of Encounter: 11/11/2018  Primary Cardiologist: Carlyle Dolly, MD   Subjective   No complaints  Inpatient Medications    Scheduled Meds: . aspirin EC  81 mg Oral Daily  . calcium acetate  667 mg Oral TID WC  . Chlorhexidine Gluconate Cloth  6 each Topical Q0600  . ciprofloxacin-dexamethasone  4 drop Left EAR BID  . clopidogrel  75 mg Oral Daily  . darbepoetin (ARANESP) injection - DIALYSIS  100 mcg Intravenous Q Mon-HD  . docusate sodium  100 mg Oral Q12H  . febuxostat  40 mg Oral Daily  . gabapentin  300 mg Oral QHS  . heparin  5,000 Units Subcutaneous Q8H  . hydrALAZINE  37.5 mg Oral BID  . insulin aspart  0-5 Units Subcutaneous QHS  . insulin aspart  0-9 Units Subcutaneous TID WC  . mouth rinse  15 mL Mouth Rinse BID  . metoprolol tartrate  12.5 mg Oral BID  . pantoprazole  40 mg Oral QAC breakfast  . thyroid  120 mg Oral Q0600   Continuous Infusions: . sodium chloride    . sodium chloride     PRN Meds: sodium chloride, sodium chloride, acetaminophen **OR** acetaminophen, heparin, lidocaine (PF), lidocaine-prilocaine, nitroGLYCERIN, ondansetron **OR** ondansetron (ZOFRAN) IV, pentafluoroprop-tetrafluoroeth, polyethylene glycol   Vital Signs    Vitals:   11/11/18 0400 11/11/18 0500 11/11/18 0600 11/11/18 0729  BP: (!) 148/56  (!) 131/46   Pulse: (!) 57 (!) 59 60 64  Resp: 16 15 (!) 8 19  Temp:    98.7 F (37.1 C)  TempSrc:    Oral  SpO2: 99% 92% 92% 96%  Weight:      Height:        Intake/Output Summary (Last 24 hours) at 11/11/2018 1010 Last data filed at 11/11/2018 0942 Gross per 24 hour  Intake 240 ml  Output 800 ml  Net -560 ml   Last 3 Weights 11/10/2018 11/10/2018 11/09/2018  Weight (lbs) 173 lb 15.1 oz 173 lb 8 oz 175 lb 7.8 oz  Weight (kg) 78.9 kg 78.7 kg 79.6 kg      Telemetry    SR - Personally Reviewed  ECG    na - Personally Reviewed  Physical Exam   GEN: No acute distress.    Neck: No JVD Cardiac: RRR, no murmurs, rubs, or gallops.  Respiratory: Clear to auscultation bilaterally. GI: Soft, nontender, non-distended  MS: No edema; No deformity. Neuro:  Nonfocal  Psych: Normal affect   Labs    Chemistry Recent Labs  Lab 11/07/18 0453 11/08/18 0433 11/10/18 0429  NA 139 139 138  K 5.1 4.6 4.2  CL 107 101 99  CO2 18* 24 24  GLUCOSE 112* 97 99  BUN 103* 64* 64*  CREATININE 7.10* 4.64* 4.43*  4.27*  CALCIUM 8.1* 8.1* 7.9*  ALBUMIN  --  3.0* 2.6*  GFRNONAA 5* 8* 9*  9*  GFRAA 6* 10* 10*  11*  ANIONGAP 14 14 15      Hematology Recent Labs  Lab 11/09/18 0439 11/10/18 0429 11/11/18 0519  WBC 6.6 6.3 6.2  RBC 2.77* 2.86* 2.90*  HGB 8.4* 8.6* 8.7*  HCT 26.9* 28.3* 28.5*  MCV 97.1 99.0 98.3  MCH 30.3 30.1 30.0  MCHC 31.2 30.4 30.5  RDW 14.4 14.4 14.0  PLT 186 223 245    Cardiac Enzymes Recent Labs  Lab 11/06/18 1848 11/07/18 0033 11/07/18 0453 11/07/18 1125  TROPONINI 0.05* 0.05* 0.05*  4.14*   No results for input(s): TROPIPOC in the last 168 hours.   BNPNo results for input(s): BNP, PROBNP in the last 168 hours.   DDimer  Recent Labs  Lab 11/06/18 0926  DDIMER 1.77*     Radiology    No results found.  Cardiac Studies    Patient Profile     Briana Vankleeck Croteauis a 78 y.o.femalewith a hx of NSTEMI rx medically due to renal dz and anemia, on DAPT>>stopped 2nd anemia, intol statins, no ACE/ARB 2nd poor renal function, EF 50-55% on echo 2018, GIB req transfusions, HTN, CKD nearing HD, hypothyroid, gout,who is being seen today for the evaluation ofNSTEMIat the request of Dr Dyann Kief.  Assessment & Plan    1. NSTEMI - multiple prior admissions with NSTEMI in the setting of severe anemia - - her initial NSTEMIadmission was at [facility] treated medically including with DAPT, primarily due to advanced renal dysfunction and anemia atht time. Peak troponin 8.5. - readmitted 10/2016 with Hgb of 4.4 , elevated troponin again.  DAPT stopped. Peak trop 1.8. - Her renal function and recurrent anemia had prohibited ischemic testing in the past. - 07/2017 admit with dark stools and anemia, off DAPT temporarily  - this admit recurrent NSTEMI with evidence of fluid overload and worsening renal failure. Trop up to 4. Symptoms much improved with fluid removal with HD - medical therapy with ASA, plavix 75, lopressor 12.5mg  bid. She has statin allergy - repeat echo LVE 94-07%, grade I diastolic dysfunction  - she is now established on HD this admission - plan for cath once respiratory status has further improved, making significant progress since starting HD and also on abx for pneumonia - down to 3L [place] today, I think she would be fine for cath tomorrow.    2. CKD - started on HD newly this admission    3. Pneumonia - per primary team  4. Anemia - Prior issues with anemia, Hgb stable this admission   I have reviewed the risks, indications, and alternatives to cardiac catheterization, possible angioplasty, and stenting with the patient today. Risks include but are not limited to bleeding, infection, vascular injury, stroke, myocardial infection, arrhythmia, kidney injury, radiation-related injury in the case of prolonged fluoroscopy use, emergency cardiac surgery, and death. The patient understands the risks of serious complication is 1-2 in 6808 with diagnostic cardiac cath and 1-2% or less with angioplasty/stenting.   For questions or updates, please contact [place] Please consult [email or web address] for contact info under        Signed, Carlyle Dolly, MD  11/11/2018, 10:10 AM

## 2018-11-12 ENCOUNTER — Other Ambulatory Visit: Payer: Self-pay | Admitting: *Deleted

## 2018-11-12 ENCOUNTER — Other Ambulatory Visit: Payer: Self-pay

## 2018-11-12 ENCOUNTER — Encounter (HOSPITAL_COMMUNITY)
Admission: EM | Disposition: A | Discharge status: Home Health | Payer: Self-pay | Source: Home / Self Care | Attending: Thoracic Surgery (Cardiothoracic Vascular Surgery)

## 2018-11-12 DIAGNOSIS — I2510 Atherosclerotic heart disease of native coronary artery without angina pectoris: Secondary | ICD-10-CM

## 2018-11-12 DIAGNOSIS — N186 End stage renal disease: Secondary | ICD-10-CM

## 2018-11-12 DIAGNOSIS — I214 Non-ST elevation (NSTEMI) myocardial infarction: Secondary | ICD-10-CM

## 2018-11-12 DIAGNOSIS — N185 Chronic kidney disease, stage 5: Secondary | ICD-10-CM

## 2018-11-12 HISTORY — PX: LEFT HEART CATH AND CORONARY ANGIOGRAPHY: CATH118249

## 2018-11-12 LAB — RENAL FUNCTION PANEL
Albumin: 2.6 g/dL — ABNORMAL LOW (ref 3.5–5.0)
Anion gap: 11 (ref 5–15)
BUN: 48 mg/dL — ABNORMAL HIGH (ref 8–23)
CO2: 27 mmol/L (ref 22–32)
Calcium: 8.5 mg/dL — ABNORMAL LOW (ref 8.9–10.3)
Chloride: 100 mmol/L (ref 98–111)
Creatinine, Ser: 3.3 mg/dL — ABNORMAL HIGH (ref 0.44–1.00)
GFR calc Af Amer: 15 mL/min — ABNORMAL LOW (ref >60)
GFR calc non Af Amer: 13 mL/min — ABNORMAL LOW (ref >60)
Glucose, Bld: 115 mg/dL — ABNORMAL HIGH (ref 70–99)
Phosphorus: 4.6 mg/dL (ref 2.5–4.6)
Potassium: 3.8 mmol/L (ref 3.5–5.1)
Sodium: 138 mmol/L (ref 135–145)

## 2018-11-12 LAB — GLUCOSE, CAPILLARY
Glucose-Capillary: 105 mg/dL — ABNORMAL HIGH (ref 70–99)
Glucose-Capillary: 111 mg/dL — ABNORMAL HIGH (ref 70–99)
Glucose-Capillary: 138 mg/dL — ABNORMAL HIGH (ref 70–99)
Glucose-Capillary: 150 mg/dL — ABNORMAL HIGH (ref 70–99)

## 2018-11-12 LAB — CULTURE, BLOOD (ROUTINE X 2)
Culture: NO GROWTH
Culture: NO GROWTH
Special Requests: ADEQUATE
Special Requests: ADEQUATE

## 2018-11-12 LAB — CBC
HCT: 28.3 % — ABNORMAL LOW (ref 36.0–46.0)
Hemoglobin: 8.8 g/dL — ABNORMAL LOW (ref 12.0–15.0)
MCH: 29.8 pg (ref 26.0–34.0)
MCHC: 31.1 g/dL (ref 30.0–36.0)
MCV: 95.9 fL (ref 80.0–100.0)
Platelets: 263 10*3/uL (ref 150–400)
RBC: 2.95 MIL/uL — ABNORMAL LOW (ref 3.87–5.11)
RDW: 14.1 % (ref 11.5–15.5)
WBC: 5.9 10*3/uL (ref 4.0–10.5)
nRBC: 0 % (ref 0.0–0.2)

## 2018-11-12 LAB — HEMOGLOBIN A1C
Hgb A1c MFr Bld: 5.2 % (ref 4.8–5.6)
Mean Plasma Glucose: 102.54 mg/dL

## 2018-11-12 LAB — PROTIME-INR
INR: 1.1 (ref 0.8–1.2)
Prothrombin Time: 14.4 seconds (ref 11.4–15.2)

## 2018-11-12 SURGERY — LEFT HEART CATH AND CORONARY ANGIOGRAPHY
Anesthesia: LOCAL

## 2018-11-12 MED ORDER — HEPARIN (PORCINE) IN NACL 1000-0.9 UT/500ML-% IV SOLN
INTRAVENOUS | Status: DC | PRN
  Administered 2018-11-12 (×2): 500 mL

## 2018-11-12 MED ORDER — SODIUM CHLORIDE 0.9% FLUSH: 3.0000 mL | INTRAVENOUS | Status: DC | PRN

## 2018-11-12 MED ORDER — LIDOCAINE HCL (PF) 1 % IJ SOLN
INTRAMUSCULAR | Status: DC | PRN
  Administered 2018-11-12: 15 mL via INTRADERMAL

## 2018-11-12 MED ORDER — FENTANYL CITRATE (PF) 100 MCG/2ML IJ SOLN
INTRAMUSCULAR | Status: DC | PRN
  Administered 2018-11-12: 25 ug via INTRAVENOUS

## 2018-11-12 MED ORDER — MIDAZOLAM HCL 2 MG/2ML IJ SOLN
INTRAMUSCULAR | Status: DC | PRN
  Administered 2018-11-12: 1 mg via INTRAVENOUS

## 2018-11-12 MED ORDER — FENTANYL CITRATE (PF) 100 MCG/2ML IJ SOLN
INTRAMUSCULAR | Status: AC
  Filled 2018-11-12: qty 2

## 2018-11-12 MED ORDER — SODIUM CHLORIDE 0.9% FLUSH
3.0000 mL | Freq: Two times a day (BID) | INTRAVENOUS | Status: DC
  Administered 2018-11-12 – 2018-11-19 (×15): 3 mL via INTRAVENOUS

## 2018-11-12 MED ORDER — SODIUM CHLORIDE 0.9 % IV SOLN
250.0000 mL | INTRAVENOUS | Status: DC | PRN
  Administered 2018-11-20 (×4): via INTRAVENOUS

## 2018-11-12 MED ORDER — MIDAZOLAM HCL 2 MG/2ML IJ SOLN
INTRAMUSCULAR | Status: AC
  Filled 2018-11-12: qty 2

## 2018-11-12 MED ORDER — LIDOCAINE HCL (PF) 1 % IJ SOLN
INTRAMUSCULAR | Status: AC
  Filled 2018-11-12: qty 30

## 2018-11-12 MED ORDER — HEPARIN (PORCINE) IN NACL 1000-0.9 UT/500ML-% IV SOLN
INTRAVENOUS | Status: AC
  Filled 2018-11-12: qty 1000

## 2018-11-12 MED ORDER — IOHEXOL 350 MG/ML SOLN
INTRAVENOUS | Status: DC | PRN
  Administered 2018-11-12: 70 mL via INTRAVENOUS

## 2018-11-12 MED ORDER — PENTAFLUOROPROP-TETRAFLUOROETH EX AERO
INHALATION_SPRAY | CUTANEOUS | Status: AC
  Filled 2018-11-12: qty 116

## 2018-11-12 SURGICAL SUPPLY — 11 items
CATH INFINITI 5 FR JL3.5 (CATHETERS) ×2 IMPLANT
CATH INFINITI 5FR AL1 (CATHETERS) ×2 IMPLANT
CATH INFINITI 5FR MULTPACK ANG (CATHETERS) ×2 IMPLANT
CLOSURE MYNX CONTROL 5F (Vascular Products) ×2 IMPLANT
KIT HEART LEFT (KITS) ×2 IMPLANT
PACK CARDIAC CATHETERIZATION (CUSTOM PROCEDURE TRAY) ×2 IMPLANT
SHEATH PINNACLE 5F 10CM (SHEATH) ×2 IMPLANT
SHEATH PROBE COVER 6X72 (BAG) ×2 IMPLANT
TRANSDUCER W/STOPCOCK (MISCELLANEOUS) ×2 IMPLANT
TUBING CIL FLEX 10 FLL-RA (TUBING) ×2 IMPLANT
WIRE EMERALD 3MM-J .035X150CM (WIRE) ×2 IMPLANT

## 2018-11-12 NOTE — Procedures (Signed)
Patient seen and examined on Hemodialysis. BP (!) 157/66 (BP Location: Left Arm)   Pulse 64   Temp 98.5 F (36.9 C) (Oral)   Resp 14   Ht 5\' 4"  (1.626 m)   Wt 78.9 kg   SpO2 98%   BMI 29.87 kg/m   QB 300 mL/ min  UF goal 2L   Doing well.  HD #4.  Tolerating treatment without complaints at this time.   Madelon Lips MD [facility] pgr [phone] 8:50 AM

## 2018-11-12 NOTE — H&P (View-Only) (Signed)
Reason for Consult:3 vessel CAD s/p Non STEMI Referring Physician: Dr. Angelena Form, Dr. De Nurse is an 78 y.o. female.  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 Presented with new onset substernal CP that woke her from sleep at [place] on 11/06/2018.  No radiation or N/V. + SOB. Initial troponin 0.03. V/Q negative for PE. On 6/12 troponin had risen to 4.14. Echo showed LVEF 55-60%, moderate LVH. + MAC but no significant valvular pathology.  No CP since admission. She was started on hemodialysis.  Today she had cardiac catheterization which revealed severe 3 vessel CAD.  Past Medical History:  Diagnosis Date  . Anemia   . Arthritis   . Blood transfusion without reported diagnosis   . Cataract   . Chronic anemia   . Chronic combined systolic and diastolic CHF (congestive heart failure) ([place])    a. 2D echo 08/2016 at [facility]: EF 50-55% with inferior wall HK, impaired LV filling, fair study.  . CKD (chronic kidney disease), stage III ([place])   . Diabetes ([place])   . Gastritis   . GERD (gastroesophageal reflux disease)   . Gout   . Headache   . HTN (hypertension)   . Hyperlipidemia   . Hypothyroidism   . Iron deficiency anemia 11/08/2016  . Normocytic anemia 10/26/2016  . NSTEMI (non-ST elevated myocardial infarction) ([place])    a. Complex admission 08/2016 - with severe hyperglycemia, AKI on CKD, severe anemia down to Hgb 6.8, acute combined CHF, troponin of 8.5, cath deferred due to renal dysfunction.  . Thyroid disease   . Wears dentures     Past Surgical History:  Procedure Laterality Date  . BASCILIC VEIN TRANSPOSITION Right 06/12/2018   Procedure: FIRST STAGE BASCILIC VEIN TRANSPOSITION RIGHT ARM;  Surgeon: Rosetta Posner, MD;  Location: [place];   Service: Vascular;  Laterality: Right;  . BASCILIC VEIN TRANSPOSITION Right 08/07/2018   Procedure: BASCILIC VEIN TRANSPOSITION SECOND STAGE RIGHT ARM;  Surgeon: Rosetta Posner, MD;  Location: [place];  Service: Vascular;  Laterality: Right;  . CATARACT EXTRACTION     left  . COLONOSCOPY WITH PROPOFOL N/A 10/08/2016   5 mm transverse colon polyp note resected due to plavix. hemorrhoids  . ENTEROSCOPY N/A 08/08/2017   Procedure: ENTEROSCOPY;  Surgeon: Daneil Dolin, MD;  [name];  Service: Endoscopy;  Laterality: N/A;  . ESOPHAGOGASTRODUODENOSCOPY N/A 10/06/2016   mild chroni gastritis, negative H.pylori  . ESOPHAGOGASTRODUODENOSCOPY (EGD) WITH PROPOFOL N/A 03/12/2017   mild edema/erythema of stomach, small bowel biopsy with focal villous tip lymphocytosis, ?partially developed celiac  . 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 GIVENS CAPSULE STUDY  10/08/2016   normal  . GIVENS CAPSULE STUDY N/A 10/29/2016   occasional gastric erosion, unremarkable small bowel  . ORIF HUMERUS FRACTURE Left 02/07/2015   Procedure: OPEN REDUCTION INTERNAL FIXATION (ORIF) PROXIMAL HUMERUS FRACTURE;  Surgeon: Marybelle Killings, MD;  Location: [place];  Service: Orthopedics;  Laterality: Left;  . teeth extractions    . THYROID SURGERY    . TOTAL HIP ARTHROPLASTY Left 02/07/2015   Procedure: TOTAL HIP  ARTHROPLASTY ANTERIOR APPROACH ;  Surgeon: Marybelle Killings, MD;  Location: [place];  Service: Orthopedics;  Laterality: Left;  . WRIST SURGERY Left     Family History  Problem Relation Age of Onset  . Diabetes Mother   . Asthma Mother   . Early death Mother 24       Pneumonia  . Early death Father        Killed at rodeo  . CAD Neg Hx   . GI Bleed Neg Hx     Social History:  reports  that she has never smoked. She has never used smokeless tobacco. She reports that she does not drink alcohol or use drugs.  Allergies:  Allergies  Allergen Reactions  . Insulin Glargine Swelling    "Makes me swell like a balloon all over", including face, but without any respiratory distress or rashes. Associated with weight gain.  . Statins Other (See Comments)    "I've tried them all; my muscle aches were so bad I couldn't walk".    Medications:  Scheduled: . aspirin EC  81 mg Oral Daily  . calcium acetate  667 mg Oral TID WC  . Chlorhexidine Gluconate Cloth  6 each Topical Q0600  . Chlorhexidine Gluconate Cloth  6 each Topical Q0600  . ciprofloxacin-dexamethasone  4 drop Left EAR BID  . darbepoetin (ARANESP) injection - DIALYSIS  100 mcg Intravenous Q Mon-HD  . docusate sodium  100 mg Oral Q12H  . febuxostat  40 mg Oral Daily  . feeding supplement (NEPRO CARB STEADY)  237 mL Oral Q24H  . gabapentin  300 mg Oral QHS  . heparin  5,000 Units Subcutaneous Q8H  . hydrALAZINE  37.5 mg Oral BID  . insulin aspart  0-5 Units Subcutaneous QHS  . insulin aspart  0-9 Units Subcutaneous TID WC  . mouth rinse  15 mL Mouth Rinse BID  . metoprolol tartrate  12.5 mg Oral BID  . pantoprazole  40 mg Oral QAC breakfast  . pentafluoroprop-tetrafluoroeth      . sodium chloride flush  3 mL Intravenous Q12H  . thyroid  120 mg Oral Q0600    Results for orders placed or performed during the hospital encounter of 11/06/18 (from the past 48 hour(s))  Glucose, capillary     Status: Abnormal   Collection Time: 11/10/18  8:46 PM  Result Value Ref Range   Glucose-Capillary 170 (H) 70 - 99 mg/dL  CBC with Differential/Platelet     Status: Abnormal   Collection Time: 11/11/18  5:19 AM  Result Value Ref Range   WBC 6.2 4.0 - 10.5 K/uL   RBC 2.90 (L) 3.87 - 5.11 MIL/uL   Hemoglobin 8.7 (L) 12.0 - 15.0 g/dL   HCT 28.5 (L) 36.0 - 46.0 %   MCV 98.3 80.0 - 100.0 fL   MCH 30.0 26.0 - 34.0 pg   MCHC 30.5  30.0 - 36.0 g/dL   RDW 14.0 11.5 - 15.5 %   Platelets 245 150 - 400 K/uL   nRBC 0.0 0.0 - 0.2 %   Neutrophils Relative % 64 %   Neutro Abs 4.0 1.7 - 7.7 K/uL   Lymphocytes Relative 15 %   Lymphs Abs 1.0 0.7 - 4.0 K/uL   Monocytes Relative 12 %   Monocytes Absolute 0.7 0.1 - 1.0 K/uL   Eosinophils Relative 8 %   Eosinophils Absolute 0.5 0.0 - 0.5 K/uL   Basophils Relative 1 %   Basophils Absolute 0.1 0.0 - 0.1  K/uL   Immature Granulocytes 0 %   Abs Immature Granulocytes 0.02 0.00 - 0.07 K/uL    Comment: Performed at [facility], [address], [place], [place] [address]  Glucose, capillary     Status: None   Collection Time: 11/11/18  7:25 AM  Result Value Ref Range   Glucose-Capillary 97 70 - 99 mg/dL  Glucose, capillary     Status: Abnormal   Collection Time: 11/11/18 11:18 AM  Result Value Ref Range   Glucose-Capillary 190 (H) 70 - 99 mg/dL  Glucose, capillary     Status: Abnormal   Collection Time: 11/11/18  3:59 PM  Result Value Ref Range   Glucose-Capillary 124 (H) 70 - 99 mg/dL  Glucose, capillary     Status: Abnormal   Collection Time: 11/11/18  9:11 PM  Result Value Ref Range   Glucose-Capillary 133 (H) 70 - 99 mg/dL  Glucose, capillary     Status: Abnormal   Collection Time: 11/12/18  6:10 AM  Result Value Ref Range   Glucose-Capillary 105 (H) 70 - 99 mg/dL  Renal function panel     Status: Abnormal   Collection Time: 11/12/18  8:22 AM  Result Value Ref Range   Sodium 138 135 - 145 mmol/L   Potassium 3.8 3.5 - 5.1 mmol/L   Chloride 100 98 - 111 mmol/L   CO2 27 22 - 32 mmol/L   Glucose, Bld 115 (H) 70 - 99 mg/dL   BUN 48 (H) 8 - 23 mg/dL   Creatinine, Ser 3.30 (H) 0.44 - 1.00 mg/dL   Calcium 8.5 (L) 8.9 - 10.3 mg/dL   Phosphorus 4.6 2.5 - 4.6 mg/dL   Albumin 2.6 (L) 3.5 - 5.0 g/dL   GFR calc non Af Amer 13 (L) >60 mL/min   GFR calc Af Amer 15 (L) >60 mL/min   Anion gap 11 5 - 15    Comment: Performed at [facility], [place] [address],  [place], [place] [address]  Protime-INR     Status: None   Collection Time: 11/12/18  8:47 AM  Result Value Ref Range   Prothrombin Time 14.4 11.4 - 15.2 seconds   INR 1.1 0.8 - 1.2    Comment: (NOTE) INR goal varies based on device and disease states. Performed at [facility], [place] [address], [place], [place] [address]   CBC     Status: Abnormal   Collection Time: 11/12/18  8:47 AM  Result Value Ref Range   WBC 5.9 4.0 - 10.5 K/uL   RBC 2.95 (L) 3.87 - 5.11 MIL/uL   Hemoglobin 8.8 (L) 12.0 - 15.0 g/dL   HCT 28.3 (L) 36.0 - 46.0 %   MCV 95.9 80.0 - 100.0 fL   MCH 29.8 26.0 - 34.0 pg   MCHC 31.1 30.0 - 36.0 g/dL   RDW 14.1 11.5 - 15.5 %   Platelets 263 150 - 400 K/uL   nRBC 0.0 0.0 - 0.2 %    Comment: Performed at [facility], [place] [address], [place], [place] [address]  Hemoglobin A1c     Status: None   Collection Time: 11/12/18  8:47 AM  Result Value Ref Range   Hgb A1c MFr Bld 5.2 4.8 - 5.6 %    Comment: (NOTE) Pre diabetes:          5.7%-6.4% Diabetes:              >6.4% Glycemic control for   <7.0% adults  with diabetes    Mean Plasma Glucose 102.54 mg/dL    Comment: Performed at [facility], [place] [address], [place], [place] [address]  Glucose, capillary     Status: Abnormal   Collection Time: 11/12/18 12:48 PM  Result Value Ref Range   Glucose-Capillary 111 (H) 70 - 99 mg/dL  Glucose, capillary     Status: Abnormal   Collection Time: 11/12/18  4:23 PM  Result Value Ref Range   Glucose-Capillary 138 (H) 70 - 99 mg/dL    Dg Chest 2 View  Result Date: 11/11/2018 CLINICAL DATA:  78 year old female with a history of weakness EXAM: CHEST - 2 VIEW COMPARISON:  CT 11/07/2018, plain film 11/06/2018 FINDINGS: Cardiomediastinal silhouette unchanged in size and contour. Improved aeration at the right lung base with blunting of the right costophrenic angle and cardiophrenic angle. Persistent opacity at the left lung base obscuring the left hemidiaphragm with  blunting at the left costophrenic angle. Meniscus at the bilateral lung bases on the lateral view. No pneumothorax. Coarsened interstitial markings throughout. No interlobular septal thickening. Surgical changes of the left humerus. Degenerative changes of the spine. No displaced fracture IMPRESSION: Bilateral pleural effusions, larger on the left, with associated atelectasis/consolidation. Electronically Signed   By: Corrie Mckusick D.O.   On: 11/11/2018 13:01  CARDIAC CATHETERIZATION Conclusion    Prox RCA lesion is 50% stenosed.  Mid RCA to Dist RCA lesion is 80% stenosed.  Dist RCA lesion is 100% stenosed.  Prox Cx lesion is 99% stenosed.  Mid Cx lesion is 99% stenosed.  Dist Cx lesion is 90% stenosed.  1st Mrg lesion is 90% stenosed.  Ost LAD to Prox LAD lesion is 70% stenosed.  Mid LAD lesion is 30% stenosed.   1. 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 Hold Plavix.     I personally reviewed the cath images and concur with the findings noted above.  Review of Systems  Constitutional: Positive for malaise/fatigue. Negative for chills and fever.  HENT:       Dentures  Respiratory: Positive for shortness of breath.   Cardiovascular: Positive for chest pain.  Gastrointestinal: Negative for nausea and vomiting.  Genitourinary: Negative for dysuria and hematuria.  Musculoskeletal: Positive for joint pain.  Skin:       Healing ulcer right foot  Neurological: Negative for loss of consciousness and weakness.  All other systems reviewed and are negative.  Blood pressure 101/88, pulse 67, temperature 97.9 F (36.6 C), temperature source Oral, resp. rate 16, height _0  (1.626 m), weight 78.7 kg, SpO2 98 %. Physical Exam  Vitals reviewed. Constitutional: She is oriented to person, place, and time. She appears  well-developed and well-nourished. No distress.  Eyes: Conjunctivae and EOM are normal. No scleral icterus.  Neck: Neck supple. No thyromegaly present.  No carotid bruits  Cardiovascular: Normal rate, regular rhythm, normal heart sounds and intact distal pulses.  No murmur heard. Respiratory: Effort normal and breath sounds normal. No respiratory distress. She has no wheezes. She has no rales.  GI: Soft. She exhibits no distension. There is no abdominal tenderness.  Musculoskeletal:        General: No edema.  Lymphadenopathy:    She has no cervical adenopathy.  Neurological: She is alert and oriented to person, place, and time. No cranial nerve deficit. She exhibits normal  muscle tone. Coordination normal.  Skin: Skin is warm and dry.  Ulcer sole right foot    Assessment/Plan: 78 yo woman with multiple CRf who presented with an unstable coronary syndrome and ruled in for a non STEMI. EF 55-60% by echo. Severe 3 vessel CAD at cath. CABG indicated for survival benefit and relief of symptoms.  I discussed the general nature of the procedure, including the need for general anesthesia, the use of cardiopulmonary bypass, the incisions to be used and the use of drainage tubes postoperatively with Ms. Kimball. We discussed the expected hospital stay, overall recovery and short and long term outcomes. I informed her of the indications, risks, benefits and alternatives. She understands the risks include, but are not limited to death, stroke, MI, DVT/PE, bleeding, possible need for transfusion, infections, cardiac arrhythmias, as well other organ system dysfunction including respiratory or GI complications.   She is at increased risk due to age and renal failure with initiation of hemodialysis.  She accepts the risks and agrees to proceed.  She has been on Plavix and will need to wait 5-7 days for plavix washout prior to CABG   Melrose Nakayama 11/12/2018, 6:17 PM

## 2018-11-12 NOTE — Plan of Care (Signed)
  Problem: Clinical Measurements: Goal: Will remain free from infection Outcome: Progressing   Problem: Coping: Goal: Level of anxiety will decrease Outcome: Progressing   Problem: Safety: Goal: Ability to remain free from injury will improve Outcome: Progressing   

## 2018-11-12 NOTE — Plan of Care (Signed)
  Problem: Clinical Measurements: Goal: Will remain free from infection Outcome: Progressing   Problem: Activity: Goal: Risk for activity intolerance will decrease Outcome: Progressing   Problem: Coping: Goal: Level of anxiety will decrease Outcome: Progressing   Problem: Safety: Goal: Ability to remain free from injury will improve Outcome: Progressing   

## 2018-11-12 NOTE — Interval H&P Note (Signed)
History and Physical Interval Note:  11/12/2018 1:43 PM  [name]  has presented today for cardiac cath with the diagnosis of NSTEMI. The various methods of treatment have been discussed with the patient and family. After consideration of risks, benefits and other options for treatment, the patient has consented to  Procedure(s): LEFT HEART CATH AND CORONARY ANGIOGRAPHY (N/A) as a surgical intervention.  The patient's history has been reviewed, patient examined, no change in status, stable for surgery.  I have reviewed the patient's chart and labs.  Questions were answered to the patient's satisfaction.    Cath Lab Visit (complete for each Cath Lab visit)  Clinical Evaluation Leading to the Procedure:   ACS: Yes.    Non-ACS:    Anginal Classification: CCS III  Anti-ischemic medical therapy: Minimal Therapy (1 class of medications)  Non-Invasive Test Results: No non-invasive testing performed  Prior CABG: No previous CABG       Lauree Chandler

## 2018-11-12 NOTE — Progress Notes (Signed)
Renal Navigator spoke with [facility] who confirmed that patient has a TTS schedule with a seat time of 11:30 as a new start to their clinic upon discharge. Joy stated that patient needs to arrive at 10:30am to complete paperwork if her first day of treatment falls on a Tuesday/Thursday and needs to come in to the clinic any time before 4pm on Friday if her first day of treatment is going to be Saturday. She states she has already spoken to patient's caregiver about this. Renal Navigator will watch for discharge and notify clinic of patient's start date.  Alphonzo Cruise, [place] Renal Navigator [phone]

## 2018-11-12 NOTE — Progress Notes (Signed)
PROGRESS NOTE   [name]  EBR:[id number]    DOB: April 05, 1941    DOA: 11/06/2018  PCP: Doree Albee, MD   I have briefly reviewed patients previous medical records in [facility].  Brief Narrative:  78 year old female with PMH of DM 2, HTN, CKD 5, CAD s/p STEMI treated medically due to advanced CKD and anemia, gout, chronic combined CHF, hypothyroid, presented to ED due to chest pain and dyspnea.  She was diagnosed with NSTEMI, acute on chronic diastolic CHF, progressive CKD to ESRD.  Nephrology consulted and HD initiated this admission.  Cardiology consulted and plan cardiac cath 6/17.   Assessment & Plan:   Principal Problem:   Non-ST elevation (NSTEMI) myocardial infarction [facility]) Active Problems:   HTN (hypertension)   Hyperlipidemia associated with type 2 diabetes mellitus ([place])   Essential hypertension, benign   Hypothyroidism   ESRD on dialysis [facility])   NSTEMI/CAD  Cardiology follow-up appreciated.  Multiple prior admissions with NSTEMI in the setting of severe anemia.  She was treated medically in the past including DAPT and intervention was avoided primarily due to advanced CKD and high risk for pushing her into ESRD requiring HD and due to anemia.  DAPT had to be stopped in 10/2016 due to hemoglobin of 4.4.  Recurrent NSTEMI this admission with associated decompensated CHF and progressive CKD.  Troponin peaked to 4.14.  Continue aspirin, Plavix, metoprolol.  Intolerant to statins.  TTE: LVEF 55-60% and grade 1 diastolic dysfunction.  Clinically euvolemic after multiple HD this admission.  Chest pain resolved.  Cardiology plans cath on 6/17 at approximately 1 PM.  New ESRD  Nephrology follow-up appreciated.  It was felt that aspirin that was started for foot ulcer probably worsened her chronic kidney disease to ESRD.  Newly started HD this admission.  Has been dialyzed 4 times thus far (6/12, 6/13, 6/15, 6/17).  Has been set up for OP HD on TTS  schedule when discharged.  Hyperkalemia, metabolic acidosis and asterixis have resolved.  Clinically appears euvolemic at this time.  Acute on chronic diastolic CHF  TTE results as above  Volume managed by serial HD as noted above.  Improved and clinically euvolemic at this time.  No clinical pneumonia and thereby not on antibiotics.  Acute respiratory failure with hypoxia  Secondary to decompensated CHF.  Resolved and currently on room air.  Type II DM with renal complications  Reasonable inpatient control on SSI.  A1c 6.1 in June 2019.  We will repeat A1c for most recent results.  Continue gabapentin for peripheral neuropathy.  Essential hypertension  Controlled on hydralazine 37.5 mg twice daily and metoprolol 12.5 mg twice daily.  Hypothyroid  Clinically euthyroid.  Continue supplements.  TSH 0.326.  Consider repeating TSH in 4 weeks.  GERD  Continue PPI.  Anemia of ESRD  Prior history of GI bleed  S/p Aranesp on 6/15.  Hemoglobin stable in the 8 g range for several days now.  Will need to monitor closely for GI bleed and/or worsening anemia while on dual antiplatelets.  Secondary hyperparathyroidism  Continue PhosLo.  Hyperlipidemia  Intolerant of statins.  Gout  No acute flare.   DVT prophylaxis: Subcutaneous heparin Code Status: Full Family Communication: None at bedside Disposition: DC home pending clinical improvement, cardiac cath this afternoon and recovery from same.  Could possibly be discharged as early as 6/18.   Consultants:  Nephrology Cardiology  Procedures:  HD  Antimicrobials:  None   Subjective: Patient seen this morning  at HD.  Reports some left knee discomfort due to inability to stretch her legs in bed.  No chest pain or dyspnea reported.  Denies any other complaints.  Indicates that she lives with her partner and is independent at home.  ROS: As above, otherwise negative  Objective:  Vitals:   11/12/18  0815 11/12/18 0845 11/12/18 0915 11/12/18 0945  BP: (!) 150/59 (!) 147/60 (!) 159/68 (!) 158/66  Pulse: 64 66 66 70  Resp:      Temp:      TempSrc:      SpO2:      Weight:      Height:        Examination:  General exam: Pleasant elderly female, moderately built and nourished lying comfortably propped up in bed undergoing HD. Respiratory system: Clear to auscultation. Respiratory effort normal. Cardiovascular system: S1 & S2 heard, RRR. No JVD, murmurs, rubs, gallops or clicks. No pedal edema.  Telemetry personally reviewed: Sinus rhythm. Gastrointestinal system: Abdomen is nondistended, soft and nontender. No organomegaly or masses felt. Normal bowel sounds heard. Central nervous system: Alert and oriented. No focal neurological deficits. Extremities: Symmetric 5 x 5 power.  RUE AVF +. Skin: No rashes, lesions or ulcers Psychiatry: Judgement and insight appear normal. Mood & affect appropriate.     Data Reviewed: I have personally reviewed following labs and imaging studies  CBC: Recent Labs  Lab 11/06/18 0926 11/08/18 0433 11/09/18 0439 11/10/18 0429 11/11/18 0519 11/12/18 0847  WBC 10.7* 8.9 6.6 6.3 6.2 5.9  NEUTROABS 9.0* 7.2 4.9 4.4 4.0  --   HGB 9.0* 8.3* 8.4* 8.6* 8.7* 8.8*  HCT 29.0* 27.3* 26.9* 28.3* 28.5* 28.3*  MCV 98.6 99.6 97.1 99.0 98.3 95.9  PLT 225 182 186 223 245 448   Basic Metabolic Panel: Recent Labs  Lab 11/06/18 0926 11/07/18 0453 11/08/18 0433 11/10/18 0429 11/12/18 0822  NA 139 139 139 138 138  K 5.4* 5.1 4.6 4.2 3.8  CL 104 107 101 99 100  CO2 17* 18* 24 24 27   GLUCOSE 108* 112* 97 99 115*  BUN 100* 103* 64* 64* 48*  CREATININE 6.82* 7.10* 4.64* 4.43*  4.27* 3.30*  CALCIUM 8.5* 8.1* 8.1* 7.9* 8.5*  PHOS  --   --  5.6* 6.2* 4.6   Liver Function Tests: Recent Labs  Lab 11/08/18 0433 11/10/18 0429 11/12/18 0822  ALBUMIN 3.0* 2.6* 2.6*   Coagulation Profile: Recent Labs  Lab 11/12/18 0847  INR 1.1   Cardiac Enzymes:  Recent Labs  Lab 11/06/18 1253 11/06/18 1848 11/07/18 0033 11/07/18 0453 11/07/18 1125  TROPONINI 0.04* 0.05* 0.05* 0.05* 4.14*   CBG: Recent Labs  Lab 11/11/18 0725 11/11/18 1118 11/11/18 1559 11/11/18 2111 11/12/18 0610  GLUCAP 97 190* 124* 133* 105*    Recent Results (from the past 240 hour(s))  SARS Coronavirus 2 (CEPHEID - Performed in [place] hospital lab), Hosp Order     Status: None   Collection Time: 11/06/18  1:30 PM   Specimen: Nasopharyngeal Swab  Result Value Ref Range Status   SARS Coronavirus 2 NEGATIVE NEGATIVE Final    Comment: (NOTE) If result is NEGATIVE SARS-CoV-2 target nucleic acids are NOT DETECTED. The SARS-CoV-2 RNA is generally detectable in upper and lower  respiratory specimens during the acute phase of infection. The lowest  concentration of SARS-CoV-2 viral copies this assay can detect is 250  copies / mL. A negative result does not preclude SARS-CoV-2 infection  and should not be used as  the sole basis for treatment or other  patient management decisions.  A negative result may occur with  improper specimen collection / handling, submission of specimen other  than nasopharyngeal swab, presence of viral mutation(s) within the  areas targeted by this assay, and inadequate number of viral copies  (<250 copies / mL). A negative result must be combined with clinical  observations, patient history, and epidemiological information. If result is POSITIVE SARS-CoV-2 target nucleic acids are DETECTED. The SARS-CoV-2 RNA is generally detectable in upper and lower  respiratory specimens dur ing the acute phase of infection.  Positive  results are indicative of active infection with SARS-CoV-2.  Clinical  correlation with patient history and other diagnostic information is  necessary to determine patient infection status.  Positive results do  not rule out bacterial infection or co-infection with other viruses. If result is PRESUMPTIVE POSTIVE  SARS-CoV-2 nucleic acids MAY BE PRESENT.   A presumptive positive result was obtained on the submitted specimen  and confirmed on repeat testing.  While 2019 novel coronavirus  (SARS-CoV-2) nucleic acids may be present in the submitted sample  additional confirmatory testing may be necessary for epidemiological  and / or clinical management purposes  to differentiate between  SARS-CoV-2 and other Sarbecovirus currently known to infect humans.  If clinically indicated additional testing with an alternate test  methodology [phone]) is advised. The SARS-CoV-2 RNA is generally  detectable in upper and lower respiratory sp ecimens during the acute  phase of infection. The expected result is Negative. Fact Sheet for Patients:  StrictlyIdeas.no Fact Sheet for Healthcare Providers: BankingDealers.co.za This test is not yet approved or cleared by the [place] FDA and has been authorized for detection and/or diagnosis of SARS-CoV-2 by FDA under an Emergency Use Authorization (EUA).  This EUA will remain in effect (meaning this test can be used) for the duration of the COVID-19 declaration under Section 564(b)(1) of the Act, 21 U.S.C. section 360bbb-3(b)(1), unless the authorization is terminated or revoked sooner. Performed at [facility], [address], [place], [place] [address]   MRSA PCR Screening     Status: None   Collection Time: 11/07/18  6:09 AM   Specimen: Nasal Mucosa; Nasopharyngeal  Result Value Ref Range Status   MRSA by PCR NEGATIVE NEGATIVE Final    Comment:        The GeneXpert MRSA Assay (FDA approved for NASAL specimens only), is one component of a comprehensive MRSA colonization surveillance program. It is not intended to diagnose MRSA infection nor to guide or monitor treatment for MRSA infections. Performed at [facility], [address] [place], [place] [address]   Culture, blood (routine x 2)     Status:  None   Collection Time: 11/07/18  7:38 AM   Specimen: Right Antecubital; Blood  Result Value Ref Range Status   Specimen Description   Final    RIGHT ANTECUBITAL BOTTLES DRAWN AEROBIC AND ANAEROBIC   Special Requests Blood Culture adequate volume  Final   Culture   Final    NO GROWTH 5 DAYS Performed at [facility], [address], [place], [place] [address]    Report Status 11/12/2018 FINAL  Final  Culture, blood (routine x 2)     Status: None   Collection Time: 11/07/18  7:38 AM   Specimen: BLOOD LEFT FOREARM  Result Value Ref Range Status   Specimen Description   Final    BLOOD LEFT FOREARM BOTTLES DRAWN AEROBIC AND ANAEROBIC   Special Requests  Blood Culture adequate volume  Final   Culture   Final    NO GROWTH 5 DAYS Performed at [facility], [address] [place], [place] [address]    Report Status 11/12/2018 FINAL  Final         Radiology Studies: Dg Chest 2 View  Result Date: 11/11/2018 CLINICAL DATA:  78 year old female with a history of weakness EXAM: CHEST - 2 VIEW COMPARISON:  CT 11/07/2018, plain film 11/06/2018 FINDINGS: Cardiomediastinal silhouette unchanged in size and contour. Improved aeration at the right lung base with blunting of the right costophrenic angle and cardiophrenic angle. Persistent opacity at the left lung base obscuring the left hemidiaphragm with blunting at the left costophrenic angle. Meniscus at the bilateral lung bases on the lateral view. No pneumothorax. Coarsened interstitial markings throughout. No interlobular septal thickening. Surgical changes of the left humerus. Degenerative changes of the spine. No displaced fracture IMPRESSION: Bilateral pleural effusions, larger on the left, with associated atelectasis/consolidation. Electronically Signed   By: Corrie Mckusick D.O.   On: 11/11/2018 13:01        Scheduled Meds: . aspirin EC  81 mg Oral Daily  . calcium acetate  667 mg Oral TID WC  . Chlorhexidine Gluconate Cloth  6 each  Topical Q0600  . Chlorhexidine Gluconate Cloth  6 each Topical Q0600  . ciprofloxacin-dexamethasone  4 drop Left EAR BID  . clopidogrel  75 mg Oral Daily  . darbepoetin (ARANESP) injection - DIALYSIS  100 mcg Intravenous Q Mon-HD  . docusate sodium  100 mg Oral Q12H  . febuxostat  40 mg Oral Daily  . feeding supplement (NEPRO CARB STEADY)  237 mL Oral Q24H  . gabapentin  300 mg Oral QHS  . heparin  5,000 Units Subcutaneous Q8H  . hydrALAZINE  37.5 mg Oral BID  . insulin aspart  0-5 Units Subcutaneous QHS  . insulin aspart  0-9 Units Subcutaneous TID WC  . mouth rinse  15 mL Mouth Rinse BID  . metoprolol tartrate  12.5 mg Oral BID  . pantoprazole  40 mg Oral QAC breakfast  . pentafluoroprop-tetrafluoroeth      . thyroid  120 mg Oral Q0600   Continuous Infusions: . sodium chloride 10 mL/hr at 11/12/18 0633     LOS: 5 days     Vernell Leep, MD, FACP, [facility]. [facility]  To contact the attending provider between 7A-7P or the covering provider during after hours 7P-7A, please log into the web site [email or web address] and access using universal [place] password for that web site. If you do not have the password, please call the hospital operator.  11/12/2018, 10:23 AM

## 2018-11-12 NOTE — Consult Note (Signed)
Reason for Consult:3 vessel CAD s/p Non STEMI Referring Physician: Dr. Angelena Form, Dr. De Nurse is an 78 y.o. female.  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 Presented with new onset substernal CP that woke her from sleep at [place] on 11/06/2018.  No radiation or N/V. + SOB. Initial troponin 0.03. V/Q negative for PE. On 6/12 troponin had risen to 4.14. Echo showed LVEF 55-60%, moderate LVH. + MAC but no significant valvular pathology.  No CP since admission. She was started on hemodialysis.  Today she had cardiac catheterization which revealed severe 3 vessel CAD.  Past Medical History:  Diagnosis Date  . Anemia   . Arthritis   . Blood transfusion without reported diagnosis   . Cataract   . Chronic anemia   . Chronic combined systolic and diastolic CHF (congestive heart failure) ([place])    a. 2D echo 08/2016 at [facility]: EF 50-55% with inferior wall HK, impaired LV filling, fair study.  . CKD (chronic kidney disease), stage III ([place])   . Diabetes ([place])   . Gastritis   . GERD (gastroesophageal reflux disease)   . Gout   . Headache   . HTN (hypertension)   . Hyperlipidemia   . Hypothyroidism   . Iron deficiency anemia 11/08/2016  . Normocytic anemia 10/26/2016  . NSTEMI (non-ST elevated myocardial infarction) ([place])    a. Complex admission 08/2016 - with severe hyperglycemia, AKI on CKD, severe anemia down to Hgb 6.8, acute combined CHF, troponin of 8.5, cath deferred due to renal dysfunction.  . Thyroid disease   . Wears dentures     Past Surgical History:  Procedure Laterality Date  . BASCILIC VEIN TRANSPOSITION Right 06/12/2018   Procedure: FIRST STAGE BASCILIC VEIN TRANSPOSITION RIGHT ARM;  Surgeon: Rosetta Posner, MD;  Location: [place];   Service: Vascular;  Laterality: Right;  . BASCILIC VEIN TRANSPOSITION Right 08/07/2018   Procedure: BASCILIC VEIN TRANSPOSITION SECOND STAGE RIGHT ARM;  Surgeon: Rosetta Posner, MD;  Location: [place];  Service: Vascular;  Laterality: Right;  . CATARACT EXTRACTION     left  . COLONOSCOPY WITH PROPOFOL N/A 10/08/2016   5 mm transverse colon polyp note resected due to plavix. hemorrhoids  . ENTEROSCOPY N/A 08/08/2017   Procedure: ENTEROSCOPY;  Surgeon: Daneil Dolin, MD;  [name];  Service: Endoscopy;  Laterality: N/A;  . ESOPHAGOGASTRODUODENOSCOPY N/A 10/06/2016   mild chroni gastritis, negative H.pylori  . ESOPHAGOGASTRODUODENOSCOPY (EGD) WITH PROPOFOL N/A 03/12/2017   mild edema/erythema of stomach, small bowel biopsy with focal villous tip lymphocytosis, ?partially developed celiac  . 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 GIVENS CAPSULE STUDY  10/08/2016   normal  . GIVENS CAPSULE STUDY N/A 10/29/2016   occasional gastric erosion, unremarkable small bowel  . ORIF HUMERUS FRACTURE Left 02/07/2015   Procedure: OPEN REDUCTION INTERNAL FIXATION (ORIF) PROXIMAL HUMERUS FRACTURE;  Surgeon: Marybelle Killings, MD;  Location: [place];  Service: Orthopedics;  Laterality: Left;  . teeth extractions    . THYROID SURGERY    . TOTAL HIP ARTHROPLASTY Left 02/07/2015   Procedure: TOTAL HIP  ARTHROPLASTY ANTERIOR APPROACH ;  Surgeon: Marybelle Killings, MD;  Location: [place];  Service: Orthopedics;  Laterality: Left;  . WRIST SURGERY Left     Family History  Problem Relation Age of Onset  . Diabetes Mother   . Asthma Mother   . Early death Mother 24       Pneumonia  . Early death Father        Killed at rode[place]  . CAD Neg Hx   . GI Bleed Neg Hx     Social History:  reports  that she has never smoked. She has never used smokeless tobacco. She reports that she does not drink alcohol or use drugs.  Allergies:  Allergies  Allergen Reactions  . Insulin Glargine Swelling    "Makes me swell like a balloon all over", including face, but without any respiratory distress or rashes. Associated with weight gain.  . Statins Other (See Comments)    "I've tried them all; my muscle aches were so bad I couldn't walk".    Medications:  Scheduled: . aspirin EC  81 mg Oral Daily  . calcium acetate  667 mg Oral TID WC  . Chlorhexidine Gluconate Cloth  6 each Topical Q0600  . Chlorhexidine Gluconate Cloth  6 each Topical Q0600  . ciprofloxacin-dexamethasone  4 drop Left EAR BID  . darbepoetin (ARANESP) injection - DIALYSIS  100 mcg Intravenous Q Mon-HD  . docusate sodium  100 mg Oral Q12H  . febuxostat  40 mg Oral Daily  . feeding supplement (NEPRO CARB STEADY)  237 mL Oral Q24H  . gabapentin  300 mg Oral QHS  . heparin  5,000 Units Subcutaneous Q8H  . hydrALAZINE  37.5 mg Oral BID  . insulin aspart  0-5 Units Subcutaneous QHS  . insulin aspart  0-9 Units Subcutaneous TID WC  . mouth rinse  15 mL Mouth Rinse BID  . metoprolol tartrate  12.5 mg Oral BID  . pantoprazole  40 mg Oral QAC breakfast  . pentafluoroprop-tetrafluoroeth      . sodium chloride flush  3 mL Intravenous Q12H  . thyroid  120 mg Oral Q0600    Results for orders placed or performed during the hospital encounter of 11/06/18 (from the past 48 hour(s))  Glucose, capillary     Status: Abnormal   Collection Time: 11/10/18  8:46 PM  Result Value Ref Range   Glucose-Capillary 170 (H) 70 - 99 mg/dL  CBC with Differential/Platelet     Status: Abnormal   Collection Time: 11/11/18  5:19 AM  Result Value Ref Range   WBC 6.2 4.0 - 10.5 K/uL   RBC 2.90 (L) 3.87 - 5.11 MIL/uL   Hemoglobin 8.7 (L) 12.0 - 15.0 g/dL   HCT 28.5 (L) 36.0 - 46.0 %   MCV 98.3 80.0 - 100.0 fL   MCH 30.0 26.0 - 34.0 pg   MCHC 30.5  30.0 - 36.0 g/dL   RDW 14.0 11.5 - 15.5 %   Platelets 245 150 - 400 K/uL   nRBC 0.0 0.0 - 0.2 %   Neutrophils Relative % 64 %   Neutro Abs 4.0 1.7 - 7.7 K/uL   Lymphocytes Relative 15 %   Lymphs Abs 1.0 0.7 - 4.0 K/uL   Monocytes Relative 12 %   Monocytes Absolute 0.7 0.1 - 1.0 K/uL   Eosinophils Relative 8 %   Eosinophils Absolute 0.5 0.0 - 0.5 K/uL   Basophils Relative 1 %   Basophils Absolute 0.1 0.0 - 0.1  K/uL   Immature Granulocytes 0 %   Abs Immature Granulocytes 0.02 0.00 - 0.07 K/uL    Comment: Performed at [facility], [address] [place], [place] [address]  Glucose, capillary     Status: None   Collection Time: 11/11/18  7:25 AM  Result Value Ref Range   Glucose-Capillary 97 70 - 99 mg/dL  Glucose, capillary     Status: Abnormal   Collection Time: 11/11/18 11:18 AM  Result Value Ref Range   Glucose-Capillary 190 (H) 70 - 99 mg/dL  Glucose, capillary     Status: Abnormal   Collection Time: 11/11/18  3:59 PM  Result Value Ref Range   Glucose-Capillary 124 (H) 70 - 99 mg/dL  Glucose, capillary     Status: Abnormal   Collection Time: 11/11/18  9:11 PM  Result Value Ref Range   Glucose-Capillary 133 (H) 70 - 99 mg/dL  Glucose, capillary     Status: Abnormal   Collection Time: 11/12/18  6:10 AM  Result Value Ref Range   Glucose-Capillary 105 (H) 70 - 99 mg/dL  Renal function panel     Status: Abnormal   Collection Time: 11/12/18  8:22 AM  Result Value Ref Range   Sodium 138 135 - 145 mmol/L   Potassium 3.8 3.5 - 5.1 mmol/L   Chloride 100 98 - 111 mmol/L   CO2 27 22 - 32 mmol/L   Glucose, Bld 115 (H) 70 - 99 mg/dL   BUN 48 (H) 8 - 23 mg/dL   Creatinine, Ser 3.30 (H) 0.44 - 1.00 mg/dL   Calcium 8.5 (L) 8.9 - 10.3 mg/dL   Phosphorus 4.6 2.5 - 4.6 mg/dL   Albumin 2.6 (L) 3.5 - 5.0 g/dL   GFR calc non Af Amer 13 (L) >60 mL/min   GFR calc Af Amer 15 (L) >60 mL/min   Anion gap 11 5 - 15    Comment: Performed at [facility], [place] [address]  [place], [place] [address]  Protime-INR     Status: None   Collection Time: 11/12/18  8:47 AM  Result Value Ref Range   Prothrombin Time 14.4 11.4 - 15.2 seconds   INR 1.1 0.8 - 1.2    Comment: (NOTE) INR goal varies based on device and disease states. Performed at [facility], [place] [address] [place], [place] [address]   CBC     Status: Abnormal   Collection Time: 11/12/18  8:47 AM  Result Value Ref Range   WBC 5.9 4.0 - 10.5 K/uL   RBC 2.95 (L) 3.87 - 5.11 MIL/uL   Hemoglobin 8.8 (L) 12.0 - 15.0 g/dL   HCT 28.3 (L) 36.0 - 46.0 %   MCV 95.9 80.0 - 100.0 fL   MCH 29.8 26.0 - 34.0 pg   MCHC 31.1 30.0 - 36.0 g/dL   RDW 14.1 11.5 - 15.5 %   Platelets 263 150 - 400 K/uL   nRBC 0.0 0.0 - 0.2 %    Comment: Performed at [facility], [place] [address] [place],  [address]  Hemoglobin A1c     Status: None   Collection Time: 11/12/18  8:47 AM  Result Value Ref Range   Hgb A1c MFr Bld 5.2 4.8 - 5.6 %    Comment: (NOTE) Pre diabetes:          5.7%-6.4% Diabetes:              >6.4% Glycemic control for   <7.0% adults  with diabetes    Mean Plasma Glucose 102.54 mg/dL    Comment: Performed at [facility], [place] [address] [place], [place] [address]  Glucose, capillary     Status: Abnormal   Collection Time: 11/12/18 12:48 PM  Result Value Ref Range   Glucose-Capillary 111 (H) 70 - 99 mg/dL  Glucose, capillary     Status: Abnormal   Collection Time: 11/12/18  4:23 PM  Result Value Ref Range   Glucose-Capillary 138 (H) 70 - 99 mg/dL    Dg Chest 2 View  Result Date: 11/11/2018 CLINICAL DATA:  78 year old female with a history of weakness EXAM: CHEST - 2 VIEW COMPARISON:  CT 11/07/2018, plain film 11/06/2018 FINDINGS: Cardiomediastinal silhouette unchanged in size and contour. Improved aeration at the right lung base with blunting of the right costophrenic angle and cardiophrenic angle. Persistent opacity at the left lung base obscuring the left hemidiaphragm with  blunting at the left costophrenic angle. Meniscus at the bilateral lung bases on the lateral view. No pneumothorax. Coarsened interstitial markings throughout. No interlobular septal thickening. Surgical changes of the left humerus. Degenerative changes of the spine. No displaced fracture IMPRESSION: Bilateral pleural effusions, larger on the left, with associated atelectasis/consolidation. Electronically Signed   By: Corrie Mckusick D.O.   On: 11/11/2018 13:01  CARDIAC CATHETERIZATION Conclusion    Prox RCA lesion is 50% stenosed.  Mid RCA to Dist RCA lesion is 80% stenosed.  Dist RCA lesion is 100% stenosed.  Prox Cx lesion is 99% stenosed.  Mid Cx lesion is 99% stenosed.  Dist Cx lesion is 90% stenosed.  1st Mrg lesion is 90% stenosed.  Ost LAD to Prox LAD lesion is 70% stenosed.  Mid LAD lesion is 30% stenosed.   1. 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 Hold Plavix.     I personally reviewed the cath images and concur with the findings noted above.  Review of Systems  Constitutional: Positive for malaise/fatigue. Negative for chills and fever.  HENT:       Dentures  Respiratory: Positive for shortness of breath.   Cardiovascular: Positive for chest pain.  Gastrointestinal: Negative for nausea and vomiting.  Genitourinary: Negative for dysuria and hematuria.  Musculoskeletal: Positive for joint pain.  Skin:       Healing ulcer right foot  Neurological: Negative for loss of consciousness and weakness.  All other systems reviewed and are negative.  Blood pressure 101/88, pulse 67, temperature 97.9 F (36.6 C), temperature source Oral, resp. rate 16, height _0  (1.626 m), weight 78.7 kg, SpO2 98 %. Physical Exam  Vitals reviewed. Constitutional: She is oriented to person, place, and time. She appears  well-developed and well-nourished. No distress.  Eyes: Conjunctivae and EOM are normal. No scleral icterus.  Neck: Neck supple. No thyromegaly present.  No carotid bruits  Cardiovascular: Normal rate, regular rhythm, normal heart sounds and intact distal pulses.  No murmur heard. Respiratory: Effort normal and breath sounds normal. No respiratory distress. She has no wheezes. She has no rales.  GI: Soft. She exhibits no distension. There is no abdominal tenderness.  Musculoskeletal:        General: No edema.  Lymphadenopathy:    She has no cervical adenopathy.  Neurological: She is alert and oriented to person, place, and time. No cranial nerve deficit. She exhibits normal  muscle tone. Coordination normal.  Skin: Skin is warm and dry.  Ulcer sole right foot    Assessment/Plan: 78 yo woman with multiple CRf who presented with an unstable coronary syndrome and ruled in for a non STEMI. EF 55-60% by echo. Severe 3 vessel CAD at cath. CABG indicated for survival benefit and relief of symptoms.  I discussed the general nature of the procedure, including the need for general anesthesia, the use of cardiopulmonary bypass, the incisions to be used and the use of drainage tubes postoperatively with Ms. Friedt. We discussed the expected hospital stay, overall recovery and short and long term outcomes. I informed her of the indications, risks, benefits and alternatives. She understands the risks include, but are not limited to death, stroke, MI, DVT/PE, bleeding, possible need for transfusion, infections, cardiac arrhythmias, as well other organ system dysfunction including respiratory or GI complications.   She is at increased risk due to age and renal failure with initiation of hemodialysis.  She accepts the risks and agrees to proceed.  She has been on Plavix and will need to wait 5-7 days for plavix washout prior to CABG   Melrose Nakayama 11/12/2018, 6:17 PM

## 2018-11-12 NOTE — Progress Notes (Signed)
  [facility] Progress Note   Assessment/ Plan:   1. Chest pain: for cardiac cath today.  Per cardiology. 2.ESRD: New start. Dialysis #1 11/07/2018 Dialysis #2 11/08/2018 Dialysis #3 11/10/2018.  HD #4 today.  Using 2 16 g needles, BFR 300.  Will contact CLIP coordinator.  3. Anemia: Hgb 8.7, Got Aranesp 100 mcg 6/15, Tsat 22% 6/3 4. CKD-MBD: check PTH 5. Nutrition: albumin 2.6, prostat 6. Hypertension: on metop, hydral 7.  Dispo: pending CLIP and cardiac cath  Subjective:    Transferred from [place] yesterday.  Seen and examined.  HD #4 today.  Feeling well.  For cardiac cath today.     Objective:   BP (!) 157/66 (BP Location: Left Arm)   Pulse 64   Temp 98.5 F (36.9 C) (Oral)   Resp 14   Ht 5\' 4"  (1.626 m)   Wt 78.9 kg   SpO2 98%   BMI 29.87 kg/m   Physical Exam: Gen: older woman, NAD, lying in bed CVS: RRR no m/r/g Resp: clear bilaterally no c/w/r Abd: soft nontender NABS Ext: trace LE edema ACCESS: AVF +T/B, accessed  Labs: BMET Recent Labs  Lab 11/06/18 0926 11/07/18 0453 11/08/18 0433 11/10/18 0429  NA 139 139 139 138  K 5.4* 5.1 4.6 4.2  CL 104 107 101 99  CO2 17* 18* 24 24  GLUCOSE 108* 112* 97 99  BUN 100* 103* 64* 64*  CREATININE 6.82* 7.10* 4.64* 4.43*  4.27*  CALCIUM 8.5* 8.1* 8.1* 7.9*  PHOS  --   --  5.6* 6.2*   CBC Recent Labs  Lab 11/08/18 0433 11/09/18 0439 11/10/18 0429 11/11/18 0519  WBC 8.9 6.6 6.3 6.2  NEUTROABS 7.2 4.9 4.4 4.0  HGB 8.3* 8.4* 8.6* 8.7*  HCT 27.3* 26.9* 28.3* 28.5*  MCV 99.6 97.1 99.0 98.3  PLT 182 186 223 245    @IMGRELPRIORS @ Medications:    . pentafluoroprop-tetrafluoroeth      . aspirin EC  81 mg Oral Daily  . calcium acetate  667 mg Oral TID WC  . Chlorhexidine Gluconate Cloth  6 each Topical Q0600  . Chlorhexidine Gluconate Cloth  6 each Topical Q0600  . ciprofloxacin-dexamethasone  4 drop Left EAR BID  . clopidogrel  75 mg Oral Daily  . darbepoetin (ARANESP) injection - DIALYSIS  100  mcg Intravenous Q Mon-HD  . docusate sodium  100 mg Oral Q12H  . febuxostat  40 mg Oral Daily  . feeding supplement (NEPRO CARB STEADY)  237 mL Oral Q24H  . gabapentin  300 mg Oral QHS  . heparin  5,000 Units Subcutaneous Q8H  . hydrALAZINE  37.5 mg Oral BID  . insulin aspart  0-5 Units Subcutaneous QHS  . insulin aspart  0-9 Units Subcutaneous TID WC  . mouth rinse  15 mL Mouth Rinse BID  . metoprolol tartrate  12.5 mg Oral BID  . pantoprazole  40 mg Oral QAC breakfast  . thyroid  120 mg Oral Q0600     Madelon Lips MD [facility] pgr [phone] 11/12/2018, 8:45 AM

## 2018-11-13 ENCOUNTER — Inpatient Hospital Stay (HOSPITAL_COMMUNITY): Payer: Medicare HMO

## 2018-11-13 ENCOUNTER — Encounter (HOSPITAL_COMMUNITY): Payer: Self-pay | Admitting: Cardiovascular Disease

## 2018-11-13 ENCOUNTER — Other Ambulatory Visit (HOSPITAL_COMMUNITY): Payer: Medicare HMO

## 2018-11-13 DIAGNOSIS — I25110 Atherosclerotic heart disease of native coronary artery with unstable angina pectoris: Secondary | ICD-10-CM

## 2018-11-13 DIAGNOSIS — I10 Essential (primary) hypertension: Secondary | ICD-10-CM

## 2018-11-13 DIAGNOSIS — E1169 Type 2 diabetes mellitus with other specified complication: Secondary | ICD-10-CM

## 2018-11-13 DIAGNOSIS — R7989 Other specified abnormal findings of blood chemistry: Secondary | ICD-10-CM

## 2018-11-13 DIAGNOSIS — Z992 Dependence on renal dialysis: Secondary | ICD-10-CM

## 2018-11-13 DIAGNOSIS — E785 Hyperlipidemia, unspecified: Secondary | ICD-10-CM

## 2018-11-13 DIAGNOSIS — N186 End stage renal disease: Secondary | ICD-10-CM

## 2018-11-13 LAB — PULMONARY FUNCTION TEST
DL/VA % pred: 84 %
DL/VA: 3.46 ml/min/mmHg/L
DLCO unc % pred: 46 %
DLCO unc: 8.81 ml/min/mmHg
FEF 25-75 Post: 0.81 L/sec
FEF 25-75 Pre: 1.14 L/sec
FEF2575-%Change-Post: -29 %
FEF2575-%Pred-Post: 53 %
FEF2575-%Pred-Pre: 75 %
FEV1-%Change-Post: -11 %
FEV1-%Pred-Post: 57 %
FEV1-%Pred-Pre: 65 %
FEV1-Post: 1.16 L
FEV1-Pre: 1.32 L
FEV1FVC-%Change-Post: -12 %
FEV1FVC-%Pred-Pre: 110 %
FEV6-%Change-Post: 0 %
FEV6-%Pred-Post: 62 %
FEV6-%Pred-Pre: 62 %
FEV6-Post: 1.6 L
FEV6-Pre: 1.61 L
FEV6FVC-%Pred-Post: 105 %
FEV6FVC-%Pred-Pre: 105 %
FVC-%Change-Post: 0 %
FVC-%Pred-Post: 59 %
FVC-%Pred-Pre: 59 %
FVC-Post: 1.62 L
FVC-Pre: 1.61 L
Post FEV1/FVC ratio: 72 %
Post FEV6/FVC ratio: 100 %
Pre FEV1/FVC ratio: 82 %
Pre FEV6/FVC Ratio: 100 %

## 2018-11-13 LAB — CBC WITH DIFFERENTIAL/PLATELET
Abs Immature Granulocytes: 0.03 10*3/uL (ref 0.00–0.07)
Basophils Absolute: 0.1 10*3/uL (ref 0.0–0.1)
Basophils Relative: 1 %
Eosinophils Absolute: 0.3 10*3/uL (ref 0.0–0.5)
Eosinophils Relative: 4 %
HCT: 30.3 % — ABNORMAL LOW (ref 36.0–46.0)
Hemoglobin: 9.4 g/dL — ABNORMAL LOW (ref 12.0–15.0)
Immature Granulocytes: 0 %
Lymphocytes Relative: 17 %
Lymphs Abs: 1.2 10*3/uL (ref 0.7–4.0)
MCH: 30.1 pg (ref 26.0–34.0)
MCHC: 31 g/dL (ref 30.0–36.0)
MCV: 97.1 fL (ref 80.0–100.0)
Monocytes Absolute: 0.9 10*3/uL (ref 0.1–1.0)
Monocytes Relative: 12 %
Neutro Abs: 4.5 10*3/uL (ref 1.7–7.7)
Neutrophils Relative %: 66 %
Platelets: 268 10*3/uL (ref 150–400)
RBC: 3.12 MIL/uL — ABNORMAL LOW (ref 3.87–5.11)
RDW: 14.3 % (ref 11.5–15.5)
WBC: 6.9 10*3/uL (ref 4.0–10.5)
nRBC: 0 % (ref 0.0–0.2)

## 2018-11-13 LAB — BASIC METABOLIC PANEL
Anion gap: 11 (ref 5–15)
BUN: 22 mg/dL (ref 8–23)
CO2: 26 mmol/L (ref 22–32)
Calcium: 8.5 mg/dL — ABNORMAL LOW (ref 8.9–10.3)
Chloride: 100 mmol/L (ref 98–111)
Creatinine, Ser: 2.36 mg/dL — ABNORMAL HIGH (ref 0.44–1.00)
GFR calc Af Amer: 22 mL/min — ABNORMAL LOW (ref >60)
GFR calc non Af Amer: 19 mL/min — ABNORMAL LOW (ref >60)
Glucose, Bld: 98 mg/dL (ref 70–99)
Potassium: 4.3 mmol/L (ref 3.5–5.1)
Sodium: 137 mmol/L (ref 135–145)

## 2018-11-13 LAB — GLUCOSE, CAPILLARY
Glucose-Capillary: 94 mg/dL (ref 70–99)
Glucose-Capillary: 112 mg/dL — ABNORMAL HIGH (ref 70–99)
Glucose-Capillary: 159 mg/dL — ABNORMAL HIGH (ref 70–99)
Glucose-Capillary: 169 mg/dL — ABNORMAL HIGH (ref 70–99)

## 2018-11-13 MED ORDER — METOPROLOL TARTRATE 25 MG PO TABS
25.0000 mg | ORAL_TABLET | Freq: Two times a day (BID) | ORAL | Status: DC
  Administered 2018-11-13 – 2018-11-19 (×11): 25 mg via ORAL
  Filled 2018-11-13 (×13): qty 1

## 2018-11-13 MED ORDER — ALBUTEROL SULFATE (2.5 MG/3ML) 0.083% IN NEBU
2.5000 mg | INHALATION_SOLUTION | Freq: Once | RESPIRATORY_TRACT | Status: AC
  Administered 2018-11-13: 2.5 mg via RESPIRATORY_TRACT

## 2018-11-13 NOTE — Consult Note (Signed)
   [facility] CM Inpatient Consult   11/13/2018  [name] 12-Mar-1941 [id number]  Patient screened for high risk score for unplanned readmission score of 25%.  Review of patient's medical record reveals patient is for Dialysis with [facility] in [place].  Primary Care Provider is  Doree Albee, MD  Plan:  Follow for progression and needs.  Please place a [facility] Care Management consult as appropriate and for questions contact:   Natividad Brood, RN BSN [facility] Liaison  [phone] business mobile phone Toll free office [phone]  Fax number: [phone] [email or web address]

## 2018-11-13 NOTE — Progress Notes (Addendum)
PROGRESS NOTE   [name]  UXN:[id number]    DOB: Nov 03, 1940    DOA: 11/06/2018  PCP: Doree Albee, MD   I have briefly reviewed patients previous medical records in [facility].  Brief Narrative:  78 year old female with PMH of DM 2, HTN, CKD 5, CAD s/p STEMI treated medically due to advanced CKD and anemia, gout, chronic combined CHF, hypothyroid, presented to ED due to chest pain and dyspnea.  She was diagnosed with NSTEMI, acute on chronic diastolic CHF, progressive CKD to ESRD.  Nephrology consulted and HD initiated this admission.  Cardiology consulted, cardiac cath 6/17 showed severe three-vessel disease, TCTS consulted and plan CABG after Plavix washout (last dose 11/12/2018).   Assessment & Plan:   Principal Problem:   Non-ST elevation (NSTEMI) myocardial infarction [facility]) Active Problems:   HTN (hypertension)   Hyperlipidemia associated with type 2 diabetes mellitus ([place])   Essential hypertension, benign   Hypothyroidism   ESRD on dialysis [facility])   NSTEMI/CAD  Cardiology follow-up appreciated.  Multiple prior admissions with NSTEMI in the setting of severe anemia.  She was treated medically in the past including DAPT and intervention was avoided primarily due to advanced CKD and high risk for pushing her into ESRD requiring HD and due to anemia.  DAPT had to be stopped in 10/2016 due to hemoglobin of 4.4.  Recurrent NSTEMI this admission with associated decompensated CHF and progressive CKD.  Troponin peaked to 4.14.  Continue aspirin, Plavix, metoprolol.  Intolerant to statins.  TTE: LVEF 55-60% and grade 1 diastolic dysfunction.  Clinically euvolemic after multiple HD this admission.  Chest pain resolved.  Cardiac cath 6/17 showed severe triple-vessel CAD with chronic occlusion of the distal RCA and thoracic surgery consulted.  TCTS plans CABG after Plavix washout (last dose 11/12/2018).  Cardiology/TCTS to advise if patient can be discharged and  brought back for this procedure next week.  New ESRD  Nephrology follow-up appreciated.  It was felt that aspirin that was started for foot ulcer probably worsened her chronic kidney disease to ESRD.  Newly started HD this admission.  Has been dialyzed 4 times thus far (6/12, 6/13, 6/15, 6/17).  Has been set up for OP HD on TTS schedule when discharged.  Hyperkalemia, metabolic acidosis and asterixis have resolved.  Clinically appears euvolemic at this time.  Improved and stable.  Based on Cardiology/TCTS recommendations regarding discharge, may have to time transition from inpatient to outpatient HD.  Acute on chronic diastolic CHF  TTE results as above  Volume managed by serial HD as noted above.  Improved and clinically euvolemic at this time.  No clinical pneumonia and thereby not on antibiotics.  Acute respiratory failure with hypoxia  Secondary to decompensated CHF.  Resolved and currently on room air.  Type II DM with renal complications  Reasonable inpatient control on SSI.  A1c 5.2 suggest good control.  Continue gabapentin for peripheral neuropathy.  Essential hypertension  Controlled on hydralazine 37.5 mg twice daily and metoprolol 12.5 mg twice daily.  Hypothyroid  Clinically euthyroid.  Continue supplements.  TSH 0.326.  Consider repeating TSH in 4 weeks.  GERD  Continue PPI.  Anemia of ESRD  Prior history of GI bleed  S/p Aranesp on 6/15.  Hemoglobin up from 8 g range to 9.4 today.  Periodically follow CBCs across HD.  Will need to monitor closely for GI bleed and/or worsening anemia while on dual antiplatelets.  Secondary hyperparathyroidism  Continue PhosLo.  Hyperlipidemia  Intolerant of  statins.  Gout  No acute flare.   DVT prophylaxis: Subcutaneous heparin Code Status: Full Family Communication: Unsuccessful attempt to reach son or significant other/partner via phone. Disposition: DC home pending further input from  Cardiology/TCTS regarding if patient can be discharged and brought back next week for CABG.   Consultants:  Nephrology Cardiology TCTS  Procedures:  HD Cardiac cath 11/12/2018:   Prox RCA lesion is 50% stenosed.  Mid RCA to Dist RCA lesion is 80% stenosed.  Dist RCA lesion is 100% stenosed.  Prox Cx lesion is 99% stenosed.  Mid Cx lesion is 99% stenosed.  Dist Cx lesion is 90% stenosed.  1st Mrg lesion is 90% stenosed.  Ost LAD to Prox LAD lesion is 70% stenosed.  Mid LAD lesion is 30% stenosed.   1. 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 Hold Plavix.    Antimicrobials:  None   Subjective: Patient reports feeling a little anxious and scared regarding surgery.  Asking if the procedure can be done less invasively and advised her to discuss those questions with the surgeons.  No chest pain or dyspnea reported.  ROS: As above, otherwise negative  Objective:  Vitals:   11/12/18 1802 11/12/18 1928 11/13/18 0352 11/13/18 0629  BP: (!) 150/61 (!) 141/45 (!) 138/56   Pulse:  68 61   Resp:  18 18   Temp:  98.1 F (36.7 C) 98 F (36.7 C)   TempSrc:  Oral    SpO2:  99% 99%   Weight:    76.7 kg  Height:        Examination:  General exam: Pleasant elderly female, moderately built and nourished sitting up comfortably in bed. Respiratory system: Clear to auscultation.  No increased work of breathing. Cardiovascular system: S1 & S2 heard, RRR. No JVD, murmurs, rubs, gallops or clicks. No pedal edema.  Telemetry personally reviewed: Sinus rhythm. Gastrointestinal system: Abdomen is nondistended, soft and nontender. No organomegaly or masses felt. Normal bowel sounds heard.  Stable. Central nervous system: Alert and oriented. No focal neurological deficits. Extremities: Symmetric 5 x 5 power.  RUE AVF +. Skin:  No rashes, lesions or ulcers Psychiatry: Judgement and insight appear normal. Mood & affect appropriate.     Data Reviewed: I have personally reviewed following labs and imaging studies  CBC: Recent Labs  Lab 11/08/18 0433 11/09/18 0439 11/10/18 0429 11/11/18 0519 11/12/18 0847 11/13/18 0421  WBC 8.9 6.6 6.3 6.2 5.9 6.9  NEUTROABS 7.2 4.9 4.4 4.0  --  4.5  HGB 8.3* 8.4* 8.6* 8.7* 8.8* 9.4*  HCT 27.3* 26.9* 28.3* 28.5* 28.3* 30.3*  MCV 99.6 97.1 99.0 98.3 95.9 97.1  PLT 182 186 223 245 263 314   Basic Metabolic Panel: Recent Labs  Lab 11/07/18 0453 11/08/18 0433 11/10/18 0429 11/12/18 0822 11/13/18 0421  NA 139 139 138 138 137  K 5.1 4.6 4.2 3.8 4.3  CL 107 101 99 100 100  CO2 18* 24 24 27 26   GLUCOSE 112* 97 99 115* 98  BUN 103* 64* 64* 48* 22  CREATININE 7.10* 4.64* 4.43*  4.27* 3.30* 2.36*  CALCIUM 8.1* 8.1* 7.9* 8.5* 8.5*  PHOS  --  5.6* 6.2* 4.6  --    Liver Function Tests: Recent Labs  Lab 11/08/18 0433 11/10/18 0429 11/12/18 0822  ALBUMIN 3.0* 2.6* 2.6*   Coagulation  Profile: Recent Labs  Lab 11/12/18 0847  INR 1.1   Cardiac Enzymes: Recent Labs  Lab 11/06/18 1253 11/06/18 1848 11/07/18 0033 11/07/18 0453 11/07/18 1125  TROPONINI 0.04* 0.05* 0.05* 0.05* 4.14*   CBG: Recent Labs  Lab 11/12/18 0610 11/12/18 1248 11/12/18 1623 11/12/18 2105 11/13/18 0558  GLUCAP 105* 111* 138* 150* 94    Recent Results (from the past 240 hour(s))  SARS Coronavirus 2 (CEPHEID - Performed in [place] hospital lab), Hosp Order     Status: None   Collection Time: 11/06/18  1:30 PM   Specimen: Nasopharyngeal Swab  Result Value Ref Range Status   SARS Coronavirus 2 NEGATIVE NEGATIVE Final    Comment: (NOTE) If result is NEGATIVE SARS-CoV-2 target nucleic acids are NOT DETECTED. The SARS-CoV-2 RNA is generally detectable in upper and lower  respiratory specimens during the acute phase of infection. The lowest  concentration of SARS-CoV-2 viral  copies this assay can detect is 250  copies / mL. A negative result does not preclude SARS-CoV-2 infection  and should not be used as the sole basis for treatment or other  patient management decisions.  A negative result may occur with  improper specimen collection / handling, submission of specimen other  than nasopharyngeal swab, presence of viral mutation(s) within the  areas targeted by this assay, and inadequate number of viral copies  (<250 copies / mL). A negative result must be combined with clinical  observations, patient history, and epidemiological information. If result is POSITIVE SARS-CoV-2 target nucleic acids are DETECTED. The SARS-CoV-2 RNA is generally detectable in upper and lower  respiratory specimens dur ing the acute phase of infection.  Positive  results are indicative of active infection with SARS-CoV-2.  Clinical  correlation with patient history and other diagnostic information is  necessary to determine patient infection status.  Positive results do  not rule out bacterial infection or co-infection with other viruses. If result is PRESUMPTIVE POSTIVE SARS-CoV-2 nucleic acids MAY BE PRESENT.   A presumptive positive result was obtained on the submitted specimen  and confirmed on repeat testing.  While 2019 novel coronavirus  (SARS-CoV-2) nucleic acids may be present in the submitted sample  additional confirmatory testing may be necessary for epidemiological  and / or clinical management purposes  to differentiate between  SARS-CoV-2 and other Sarbecovirus currently known to infect humans.  If clinically indicated additional testing with an alternate test  methodology [phone]) is advised. The SARS-CoV-2 RNA is generally  detectable in upper and lower respiratory sp ecimens during the acute  phase of infection. The expected result is Negative. Fact Sheet for Patients:  StrictlyIdeas.no Fact Sheet for Healthcare Providers:  BankingDealers.co.za This test is not yet approved or cleared by the [place] FDA and has been authorized for detection and/or diagnosis of SARS-CoV-2 by FDA under an Emergency Use Authorization (EUA).  This EUA will remain in effect (meaning this test can be used) for the duration of the COVID-19 declaration under Section 564(b)(1) of the Act, 21 U.S.C. section 360bbb-3(b)(1), unless the authorization is terminated or revoked sooner. Performed at [facility], [address], [place], [place] [address]   MRSA PCR Screening     Status: None   Collection Time: 11/07/18  6:09 AM   Specimen: Nasal Mucosa; Nasopharyngeal  Result Value Ref Range Status   MRSA by PCR NEGATIVE NEGATIVE Final    Comment:        The GeneXpert MRSA Assay (FDA approved for NASAL specimens only), is  one component of a comprehensive MRSA colonization surveillance program. It is not intended to diagnose MRSA infection nor to guide or monitor treatment for MRSA infections. Performed at [facility], [address], [place], [place] [address]   Culture, blood (routine x 2)     Status: None   Collection Time: 11/07/18  7:38 AM   Specimen: Right Antecubital; Blood  Result Value Ref Range Status   Specimen Description   Final    RIGHT ANTECUBITAL BOTTLES DRAWN AEROBIC AND ANAEROBIC   Special Requests Blood Culture adequate volume  Final   Culture   Final    NO GROWTH 5 DAYS Performed at [facility], [address] [place], [place] [address]    Report Status 11/12/2018 FINAL  Final  Culture, blood (routine x 2)     Status: None   Collection Time: 11/07/18  7:38 AM   Specimen: BLOOD LEFT FOREARM  Result Value Ref Range Status   Specimen Description   Final    BLOOD LEFT FOREARM BOTTLES DRAWN AEROBIC AND ANAEROBIC   Special Requests Blood Culture adequate volume  Final   Culture   Final    NO GROWTH 5 DAYS Performed at [facility], [address], [place], [place] [address]     Report Status 11/12/2018 FINAL  Final         Radiology Studies: Dg Chest 2 View  Result Date: 11/11/2018 CLINICAL DATA:  78 year old female with a history of weakness EXAM: CHEST - 2 VIEW COMPARISON:  CT 11/07/2018, plain film 11/06/2018 FINDINGS: Cardiomediastinal silhouette unchanged in size and contour. Improved aeration at the right lung base with blunting of the right costophrenic angle and cardiophrenic angle. Persistent opacity at the left lung base obscuring the left hemidiaphragm with blunting at the left costophrenic angle. Meniscus at the bilateral lung bases on the lateral view. No pneumothorax. Coarsened interstitial markings throughout. No interlobular septal thickening. Surgical changes of the left humerus. Degenerative changes of the spine. No displaced fracture IMPRESSION: Bilateral pleural effusions, larger on the left, with associated atelectasis/consolidation. Electronically Signed   By: Corrie Mckusick D.O.   On: 11/11/2018 13:01        Scheduled Meds: . aspirin EC  81 mg Oral Daily  . calcium acetate  667 mg Oral TID WC  . Chlorhexidine Gluconate Cloth  6 each Topical Q0600  . Chlorhexidine Gluconate Cloth  6 each Topical Q0600  . ciprofloxacin-dexamethasone  4 drop Left EAR BID  . darbepoetin (ARANESP) injection - DIALYSIS  100 mcg Intravenous Q Mon-HD  . docusate sodium  100 mg Oral Q12H  . febuxostat  40 mg Oral Daily  . feeding supplement (NEPRO CARB STEADY)  237 mL Oral Q24H  . gabapentin  300 mg Oral QHS  . heparin  5,000 Units Subcutaneous Q8H  . hydrALAZINE  37.5 mg Oral BID  . insulin aspart  0-5 Units Subcutaneous QHS  . insulin aspart  0-9 Units Subcutaneous TID WC  . mouth rinse  15 mL Mouth Rinse BID  . metoprolol tartrate  12.5 mg Oral BID  . pantoprazole  40 mg Oral QAC breakfast  . sodium chloride flush  3 mL Intravenous Q12H  . thyroid  120 mg Oral Q0600   Continuous Infusions: . sodium chloride       LOS: 6 days     Vernell Leep,  MD, FACP, [facility]. [facility]  To contact the attending provider between 7A-7P or the covering provider during after hours 7P-7A, please log into  the web site [email or web address] and access using universal [place] password for that web site. If you do not have the password, please call the hospital operator.  11/13/2018, 9:48 AM

## 2018-11-13 NOTE — Progress Notes (Signed)
  [facility] Progress Note   Assessment/ Plan:   1. Severe 3 vessel CAD--> TCTS consulted, possible d/c vs remain inpt for CABG. 2.ESRD: New start. Dialysis #1 11/07/2018 Dialysis #2 11/08/2018 Dialysis #3 11/10/2018.  HD #4 6/17.  Using 2 16 g needles, BFR 300.  CLIP'd to [facility] TTS 11:30.   3. Anemia: Hgb 8.7, Got Aranesp 100 mcg 6/15, Tsat 22% 6/3 4. CKD-MBD: check PTH-- 114 6/3, no VDRA 5. Nutrition: albumin 2.6, prostat 6. Hypertension: on metop, hydral 7.  Dispo: pending   Subjective:    Cardiac cath showing severe vessel disease, CABG recommended, seen by Dr. Roxan Hockey.  D/c vs keep inpt until surg.  Pt reports feeling much better today, "ready to walk"   Objective:   BP (!) 138/56 (BP Location: Right Leg)   Pulse 61   Temp 98 F (36.7 C)   Resp 18   Ht 5\' 4"  (1.626 m)   Wt 76.7 kg Comment: scale c  SpO2 99%   BMI 29.04 kg/m   Physical Exam: Gen: older woman, NAD, lying in bed CVS: RRR no m/r/g Resp: clear bilaterally no c/w/r Abd: soft nontender NABS Ext: trace LE edema ACCESS: AVF +T/B Labs: BMET Recent Labs  Lab 11/07/18 0453 11/08/18 0433 11/10/18 0429 11/12/18 0822 11/13/18 0421  NA 139 139 138 138 137  K 5.1 4.6 4.2 3.8 4.3  CL 107 101 99 100 100  CO2 18* 24 24 27 26   GLUCOSE 112* 97 99 115* 98  BUN 103* 64* 64* 48* 22  CREATININE 7.10* 4.64* 4.43*  4.27* 3.30* 2.36*  CALCIUM 8.1* 8.1* 7.9* 8.5* 8.5*  PHOS  --  5.6* 6.2* 4.6  --    CBC Recent Labs  Lab 11/09/18 0439 11/10/18 0429 11/11/18 0519 11/12/18 0847 11/13/18 0421  WBC 6.6 6.3 6.2 5.9 6.9  NEUTROABS 4.9 4.4 4.0  --  4.5  HGB 8.4* 8.6* 8.7* 8.8* 9.4*  HCT 26.9* 28.3* 28.5* 28.3* 30.3*  MCV 97.1 99.0 98.3 95.9 97.1  PLT 186 223 245 263 268    @IMGRELPRIORS @ Medications:    . aspirin EC  81 mg Oral Daily  . calcium acetate  667 mg Oral TID WC  . Chlorhexidine Gluconate Cloth  6 each Topical Q0600  . Chlorhexidine Gluconate Cloth  6 each Topical Q0600  .  ciprofloxacin-dexamethasone  4 drop Left EAR BID  . darbepoetin (ARANESP) injection - DIALYSIS  100 mcg Intravenous Q Mon-HD  . docusate sodium  100 mg Oral Q12H  . febuxostat  40 mg Oral Daily  . feeding supplement (NEPRO CARB STEADY)  237 mL Oral Q24H  . gabapentin  300 mg Oral QHS  . heparin  5,000 Units Subcutaneous Q8H  . hydrALAZINE  37.5 mg Oral BID  . insulin aspart  0-5 Units Subcutaneous QHS  . insulin aspart  0-9 Units Subcutaneous TID WC  . mouth rinse  15 mL Mouth Rinse BID  . metoprolol tartrate  12.5 mg Oral BID  . pantoprazole  40 mg Oral QAC breakfast  . sodium chloride flush  3 mL Intravenous Q12H  . thyroid  120 mg Oral Q0600     Madelon Lips MD [facility] pgr [phone] 11/13/2018, 10:29 AM

## 2018-11-13 NOTE — Progress Notes (Signed)
Progress Note  Patient Name: [name] Date of Encounter: 11/13/2018  Primary Cardiologist: Carlyle Dolly, MD   Subjective   No significant overnight events. Patient states she feels great and 100% better this morning. No chest pain or shortness of breath.  Inpatient Medications    Scheduled Meds: . aspirin EC  81 mg Oral Daily  . calcium acetate  667 mg Oral TID WC  . Chlorhexidine Gluconate Cloth  6 each Topical Q0600  . Chlorhexidine Gluconate Cloth  6 each Topical Q0600  . ciprofloxacin-dexamethasone  4 drop Left EAR BID  . darbepoetin (ARANESP) injection - DIALYSIS  100 mcg Intravenous Q Mon-HD  . docusate sodium  100 mg Oral Q12H  . febuxostat  40 mg Oral Daily  . feeding supplement (NEPRO CARB STEADY)  237 mL Oral Q24H  . gabapentin  300 mg Oral QHS  . heparin  5,000 Units Subcutaneous Q8H  . hydrALAZINE  37.5 mg Oral BID  . insulin aspart  0-5 Units Subcutaneous QHS  . insulin aspart  0-9 Units Subcutaneous TID WC  . mouth rinse  15 mL Mouth Rinse BID  . metoprolol tartrate  12.5 mg Oral BID  . pantoprazole  40 mg Oral QAC breakfast  . sodium chloride flush  3 mL Intravenous Q12H  . thyroid  120 mg Oral Q0600   Continuous Infusions: . sodium chloride     PRN Meds: sodium chloride, acetaminophen **OR** acetaminophen, heparin, lidocaine (PF), lidocaine-prilocaine, nitroGLYCERIN, ondansetron **OR** ondansetron (ZOFRAN) IV, pentafluoroprop-tetrafluoroeth, polyethylene glycol, sodium chloride flush   Vital Signs    Vitals:   11/12/18 1802 11/12/18 1928 11/13/18 0352 11/13/18 0629  BP: (!) 150/61 (!) 141/45 (!) 138/56   Pulse:  68 61   Resp:  18 18   Temp:  98.1 F (36.7 C) 98 F (36.7 C)   TempSrc:  Oral    SpO2:  99% 99%   Weight:    76.7 kg  Height:        Intake/Output Summary (Last 24 hours) at 11/13/2018 0908 Last data filed at 11/13/2018 0500 Gross per 24 hour  Intake 550 ml  Output 1835 ml  Net -1285 ml   Filed Weights   11/12/18  0258 11/12/18 0741 11/13/18 0629  Weight: 78.9 kg 78.7 kg 76.7 kg    Telemetry    Normal sinus rhythm with rates in the 60's to 70's. - Personally Reviewed  ECG    No new ECG tracing today. - Personally Reviewed  Physical Exam   GEN: Caucasian female resting comfortably. Alert and in no acute distress.   Neck: Supple.  Cardiac: RRR. No murmurs, gallops, or rubs. Right femoral cath site soft with no signs of hematoma. Respiratory: Clear to auscultation bilaterally. No wheezes, rhonchi, or rales. GI: Abdomen soft, non-distended, and non-tender. Bowel sounds present. Extremities: No lower extremity edema. No deformity. Radial and distal pedal pulses 2+ bilaterally. Skin: Warm and dry. Neuro:  No focal deficits. Psych: Normal affect. Responds appropriately.  Labs    Chemistry Recent Labs  Lab 11/08/18 0433 11/10/18 0429 11/12/18 0822 11/13/18 0421  NA 139 138 138 137  K 4.6 4.2 3.8 4.3  CL 101 99 100 100  CO2 24 24 27 26   GLUCOSE 97 99 115* 98  BUN 64* 64* 48* 22  CREATININE 4.64* 4.43*  4.27* 3.30* 2.36*  CALCIUM 8.1* 7.9* 8.5* 8.5*  ALBUMIN 3.0* 2.6* 2.6*  --   GFRNONAA 8* 9*  9* 13* 19*  GFRAA 10*  10*  11* 15* 22*  ANIONGAP 14 15 11 11      Hematology Recent Labs  Lab 11/11/18 0519 11/12/18 0847 11/13/18 0421  WBC 6.2 5.9 6.9  RBC 2.90* 2.95* 3.12*  HGB 8.7* 8.8* 9.4*  HCT 28.5* 28.3* 30.3*  MCV 98.3 95.9 97.1  MCH 30.0 29.8 30.1  MCHC 30.5 31.1 31.0  RDW 14.0 14.1 14.3  PLT 245 263 268    Cardiac Enzymes Recent Labs  Lab 11/06/18 1848 11/07/18 0033 11/07/18 0453 11/07/18 1125  TROPONINI 0.05* 0.05* 0.05* 4.14*   No results for input(s): TROPIPOC in the last 168 hours.   BNPNo results for input(s): BNP, PROBNP in the last 168 hours.   DDimer  Recent Labs  Lab 11/06/18 0926  DDIMER 1.77*     Radiology    Dg Chest 2 View  Result Date: 11/11/2018 CLINICAL DATA:  78 year old female with a history of weakness EXAM: CHEST - 2 VIEW  COMPARISON:  CT 11/07/2018, plain film 11/06/2018 FINDINGS: Cardiomediastinal silhouette unchanged in size and contour. Improved aeration at the right lung base with blunting of the right costophrenic angle and cardiophrenic angle. Persistent opacity at the left lung base obscuring the left hemidiaphragm with blunting at the left costophrenic angle. Meniscus at the bilateral lung bases on the lateral view. No pneumothorax. Coarsened interstitial markings throughout. No interlobular septal thickening. Surgical changes of the left humerus. Degenerative changes of the spine. No displaced fracture IMPRESSION: Bilateral pleural effusions, larger on the left, with associated atelectasis/consolidation. Electronically Signed   By: Corrie Mckusick D.O.   On: 11/11/2018 13:01    Cardiac Studies   Echocardiogram 11/10/2018: Impressions: 1. The left ventricle has normal systolic function, with an ejection fraction of 55-60%. The cavity size was normal. There is moderately increased left ventricular wall thickness. Left ventricular diastolic Doppler parameters are consistent with  impaired relaxation. Elevated mean left atrial pressure.  2. The right ventricle has normal systolic function. The cavity was normal. There is no increase in right ventricular wall thickness.  3. Left atrial size was moderately dilated.  4. Right atrial size was mildly dilated.  5. The aortic valve is tricuspid. Moderate thickening of the aortic valve. Moderate calcification of the aortic valve. No stenosis of the aortic valve. Moderate aortic annular calcification noted.  6. The mitral valve is abnormal. Moderate thickening of the mitral valve leaflet. Moderate calcification of the mitral valve leaflet. There is moderate mitral annular calcification present. No evidence of mitral valve stenosis.  7. The aortic root is normal in size and structure.  8. Pulmonary hypertension is mildly elevated, PASP is 34 mmHg. _______________  Left  Heart Catheterization 11/12/2018:  Prox RCA lesion is 50% stenosed.  Mid RCA to Dist RCA lesion is 80% stenosed.  Dist RCA lesion is 100% stenosed.  Prox Cx lesion is 99% stenosed.  Mid Cx lesion is 99% stenosed.  Dist Cx lesion is 90% stenosed.  1st Mrg lesion is 90% stenosed.  Ost LAD to Prox LAD lesion is 70% stenosed.  Mid LAD lesion is 30% stenosed.   1. 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 Hold Plavix.   Patient Profile    Ms. Specht is a 78 y.o. 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 Troponin peaked at 4.14. Patient was started on hemodialysis due to uremia. Patient transferred to [facility] on 11/11/2018 for cardiac catheterization now that hemodialysis established.  Assessment & Plan    NSTEMI - EKG with non-specific ST/T changes. - Troponin peaked at 4.14. - Echo showed LVEF of 55-60% with grade 1 diastolic dysfunction. - Symptoms improved with hemodialysis. - Left heart catheterization on 6/17 showed severe triple vessel CAD with chronic occlusion of the distal RCA. CT surgery was consulted for possible CABG. Dr. Roxan Hockey saw patient and recommended CABG following Plavix washout. Last dose of Plavix was on 6/17 at 1319. Will likely need to wait 5-7 days. Patient willing to stay in hospital until procedure. - Continue medical therapy with Aspirin 81mg  daily and Lopressor 12.5mg  twice daily. Patient has  an allergy to statins.  Acute on Chronic Combined CHF - Echo this admission showed showed LVEF of 55-60% with grade 1 diastolic dysfunction. - Volume being managed with hemodialysis. - Continue beta-blocker as above. Consider transitioning Lopressor to Toprol prior to discharge. - Continue to monitor daily weights, strict I/O's, and renal function.   Progressive CKD Stage IV - Started on hemodialysis this admission. - Management per Nephrology.  Anemia of Chronic Disease - Hemoglobin stable at 9.4 today.  - Management per primary team and Nephrology.  Possible Pneumonia - Chest CT on 11/07/2018 showed bilateral multifocal airspace consolidation and atelectasis consistent with multifocal pneumonia.  - Patient was afebrile with normal WBC. Breathing improved with dialysis so antibiotics were never started. - Repeat chest x-ray on 11/11/2018 showed bilateral pleural effusions (left > right) with associated atelectasis/consolidation). - Management per primary team.  Otherwise, per primary team.  For questions or updates, please contact [place] Please consult [email or web address] for contact info under Cardiology/STEMI.      Signed, Darreld Mclean, PA-C  11/13/2018, 9:08 AM   Pager: [phone]

## 2018-11-13 NOTE — Progress Notes (Signed)
CARDIAC REHAB PHASE I   Pre-op education completed with pt. Pt given Heart Surgery booklet, OHS care guide, in-the-tube sheet, and IS. Pt demonstrating 1000 on Is, encouraged to use during commercial break while waiting for surgery. Pt states she is feeling better, but is anxious about surgery. Will continue to follow pt and encourage IS, and walks.   [id number] [name], RN BSN 11/13/2018 11:15 AM

## 2018-11-13 NOTE — Care Management Important Message (Signed)
Important Message  Patient Details  Name: [name] MRN: [id number] Date of Birth: [birth year]   Medicare Important Message Given:  Yes    [name] 11/13/2018, 12:57 PM

## 2018-11-14 LAB — CBC WITH DIFFERENTIAL/PLATELET
Abs Immature Granulocytes: 0.04 10*3/uL (ref 0.00–0.07)
Basophils Absolute: 0.1 10*3/uL (ref 0.0–0.1)
Basophils Relative: 1 %
Eosinophils Absolute: 0.5 10*3/uL (ref 0.0–0.5)
Eosinophils Relative: 6 %
HCT: 28.5 % — ABNORMAL LOW (ref 36.0–46.0)
Hemoglobin: 8.8 g/dL — ABNORMAL LOW (ref 12.0–15.0)
Immature Granulocytes: 1 %
Lymphocytes Relative: 18 %
Lymphs Abs: 1.4 10*3/uL (ref 0.7–4.0)
MCH: 29.8 pg (ref 26.0–34.0)
MCHC: 30.9 g/dL (ref 30.0–36.0)
MCV: 96.6 fL (ref 80.0–100.0)
Monocytes Absolute: 0.8 10*3/uL (ref 0.1–1.0)
Monocytes Relative: 11 %
Neutro Abs: 4.9 10*3/uL (ref 1.7–7.7)
Neutrophils Relative %: 63 %
Platelets: 308 10*3/uL (ref 150–400)
RBC: 2.95 MIL/uL — ABNORMAL LOW (ref 3.87–5.11)
RDW: 14.2 % (ref 11.5–15.5)
WBC: 7.6 10*3/uL (ref 4.0–10.5)
nRBC: 0 % (ref 0.0–0.2)

## 2018-11-14 LAB — CREATININE, SERUM
Creatinine, Ser: 4.01 mg/dL — ABNORMAL HIGH (ref 0.44–1.00)
GFR calc Af Amer: 12 mL/min — ABNORMAL LOW (ref >60)
GFR calc non Af Amer: 10 mL/min — ABNORMAL LOW (ref >60)

## 2018-11-14 LAB — GLUCOSE, CAPILLARY
Glucose-Capillary: 111 mg/dL — ABNORMAL HIGH (ref 70–99)
Glucose-Capillary: 152 mg/dL — ABNORMAL HIGH (ref 70–99)
Glucose-Capillary: 202 mg/dL — ABNORMAL HIGH (ref 70–99)

## 2018-11-14 NOTE — Progress Notes (Signed)
PROGRESS NOTE   [name]  LYY:[id number]    DOB: 13-Dec-1940    DOA: 11/06/2018  PCP: Doree Albee, MD   I have briefly reviewed patients previous medical records in [facility].  Brief Narrative:  78 year old female with PMH of DM 2, HTN, CKD 5, CAD s/p STEMI treated medically due to advanced CKD and anemia, gout, chronic combined CHF, hypothyroid, presented to ED due to chest pain and dyspnea.  She was diagnosed with NSTEMI, acute on chronic diastolic CHF, progressive CKD to ESRD.  Nephrology consulted and HD initiated this admission.  Cardiology consulted, cardiac cath 6/17 showed severe three-vessel disease, TCTS consulted and plan CABG after Plavix washout (last dose 11/12/2018).  Patient will stay in-house for CABG sometime next week.   Assessment & Plan:   Principal Problem:   Non-ST elevation (NSTEMI) myocardial infarction [facility]) Active Problems:   HTN (hypertension)   Hyperlipidemia associated with type 2 diabetes mellitus ([place])   Essential hypertension, benign   Hypothyroidism   ESRD on dialysis [facility])   NSTEMI/CAD  Cardiology follow-up appreciated.  Multiple prior admissions with NSTEMI in the setting of severe anemia.  She was treated medically in the past including DAPT and intervention was avoided primarily due to advanced CKD and high risk for pushing her into ESRD requiring HD and due to anemia.  DAPT had to be stopped in 10/2016 due to hemoglobin of 4.4.  Recurrent NSTEMI this admission with associated decompensated CHF and progressive CKD.  Troponin peaked to 4.14.  Continue aspirin, Plavix, metoprolol.  Intolerant to statins.  TTE: LVEF 55-60% and grade 1 diastolic dysfunction.  Clinically euvolemic after multiple HD this admission.  Chest pain resolved.  Cardiac cath 6/17 showed severe triple-vessel CAD with chronic occlusion of the distal RCA and thoracic surgery consulted.  TCTS plans CABG after Plavix washout (last dose 11/12/2018).     As discussed with Dr. Ellyn Hack on 6/18, patient to remain in the hospital until surgery next week given need for HD and current COVID-19 pandemic.  New ESRD  Nephrology follow-up appreciated.  It was felt that aspirin that was started for foot ulcer probably worsened her chronic kidney disease to ESRD.  Newly started HD this admission.  Has been dialyzed 4 times thus far (6/12, 6/13, 6/15, 6/17).  Has been set up for OP HD on TTS schedule when discharged.  Hyperkalemia, metabolic acidosis and asterixis have resolved.  Clinically appears euvolemic at this time.  Stable.  Reportedly for HD today.  Acute on chronic diastolic CHF  TTE results as above  Volume managed by serial HD as noted above.  Euvolemic.  No clinical pneumonia and thereby not on antibiotics.  Acute respiratory failure with hypoxia  Secondary to decompensated CHF.  Resolved.  Type II DM with renal complications  Reasonable inpatient control on SSI.  A1c 5.2 suggest good control.  Continue gabapentin for peripheral neuropathy.  Essential hypertension  Controlled on hydralazine 37.5 mg twice daily and metoprolol increased to 25 mg twice daily.  Controlled.  Hypothyroid  Clinically euthyroid.  Continue supplements.  TSH 0.326.  Consider repeating TSH in 4 weeks.  GERD  Continue PPI.  Anemia of ESRD  Prior history of GI bleed  S/p Aranesp on 6/15.  Hemoglobin up from 8 g range to 9.4 today.  Periodically follow CBCs across HD.  Will need to monitor closely for GI bleed and/or worsening anemia while on dual antiplatelets.  Secondary hyperparathyroidism  Continue PhosLo.  Hyperlipidemia  Intolerant of  statins.  Gout  No acute flare.   DVT prophylaxis: Subcutaneous heparin Code Status: Full Family Communication: I was able to reach and discussed with patient's partner via phone on 6/18.  He had questions if her procedure could be done robotically and advised him and patient to  discuss directly with surgeons.  I discussed in detail with patient's son on 6/19 who is in [place], updated care and answered all questions. Disposition: To be determined pending CABG sometime next week.   Consultants:  Nephrology Cardiology TCTS  Procedures:  HD Cardiac cath 11/12/2018:   Prox RCA lesion is 50% stenosed.  Mid RCA to Dist RCA lesion is 80% stenosed.  Dist RCA lesion is 100% stenosed.  Prox Cx lesion is 99% stenosed.  Mid Cx lesion is 99% stenosed.  Dist Cx lesion is 90% stenosed.  1st Mrg lesion is 90% stenosed.  Ost LAD to Prox LAD lesion is 70% stenosed.  Mid LAD lesion is 30% stenosed.   1. 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 Hold Plavix.    Antimicrobials:  None   Subjective: Denies complaints.  States that she just feels lazy today.  No dyspnea, chest pain or palpitations.  ROS: As above, otherwise negative  Objective:  Vitals:   11/13/18 1922 11/13/18 2244 11/14/18 0349 11/14/18 0817  BP: 108/68 (!) 126/55 (!) 142/61 (!) 126/45  Pulse: 73 72 64 68  Resp: 18  18   Temp: 98.5 F (36.9 C)  97.8 F (36.6 C) 98.7 F (37.1 C)  TempSrc: Oral   Oral  SpO2: 96%  95% 95%  Weight:   77.1 kg   Height:        Examination:  General exam: Pleasant elderly female, moderately built and nourished lying comfortably supine in bed.  Oral mucosa moist. Respiratory system: Clear to auscultation.  No increased work of breathing.  Stable Cardiovascular system: S1 & S2 heard, RRR. No JVD, murmurs, rubs, gallops or clicks. No pedal edema.  Telemetry personally reviewed: Sinus rhythm. Gastrointestinal system: Abdomen is nondistended, soft and nontender. No organomegaly or masses felt. Normal bowel sounds heard.  Stable. Central nervous system: Alert and oriented. No focal neurological  deficits. Extremities: Symmetric 5 x 5 power.  RUE AVF +. Skin: No rashes, lesions or ulcers Psychiatry: Judgement and insight appear normal. Mood & affect appropriate.     Data Reviewed: I have personally reviewed following labs and imaging studies  CBC: Recent Labs  Lab 11/09/18 0439 11/10/18 0429 11/11/18 0519 11/12/18 0847 11/13/18 0421 11/14/18 0448  WBC 6.6 6.3 6.2 5.9 6.9 7.6  NEUTROABS 4.9 4.4 4.0  --  4.5 4.9  HGB 8.4* 8.6* 8.7* 8.8* 9.4* 8.8*  HCT 26.9* 28.3* 28.5* 28.3* 30.3* 28.5*  MCV 97.1 99.0 98.3 95.9 97.1 96.6  PLT 186 223 245 263 268 622   Basic Metabolic Panel: Recent Labs  Lab 11/08/18 0433 11/10/18 0429 11/12/18 0822 11/13/18 0421 11/14/18 0448  NA 139 138 138 137  --   K 4.6 4.2 3.8 4.3  --   CL 101 99 100 100  --   CO2 24 24 27 26   --   GLUCOSE 97 99 115* 98  --   BUN 64* 64* 48* 22  --   CREATININE 4.64* 4.43*  4.27* 3.30* 2.36* 4.01*  CALCIUM 8.1* 7.9* 8.5* 8.5*  --  PHOS 5.6* 6.2* 4.6  --   --    Liver Function Tests: Recent Labs  Lab 11/08/18 0433 11/10/18 0429 11/12/18 0822  ALBUMIN 3.0* 2.6* 2.6*   Coagulation Profile: Recent Labs  Lab 11/12/18 0847  INR 1.1   Cardiac Enzymes: Recent Labs  Lab 11/07/18 1125  TROPONINI 4.14*   CBG: Recent Labs  Lab 11/13/18 0558 11/13/18 1231 11/13/18 1648 11/13/18 2055 11/14/18 0630  GLUCAP 94 112* 159* 169* 111*    Recent Results (from the past 240 hour(s))  SARS Coronavirus 2 (CEPHEID - Performed in [place] hospital lab), Hosp Order     Status: None   Collection Time: 11/06/18  1:30 PM   Specimen: Nasopharyngeal Swab  Result Value Ref Range Status   SARS Coronavirus 2 NEGATIVE NEGATIVE Final    Comment: (NOTE) If result is NEGATIVE SARS-CoV-2 target nucleic acids are NOT DETECTED. The SARS-CoV-2 RNA is generally detectable in upper and lower  respiratory specimens during the acute phase of infection. The lowest  concentration of SARS-CoV-2 viral copies this assay  can detect is 250  copies / mL. A negative result does not preclude SARS-CoV-2 infection  and should not be used as the sole basis for treatment or other  patient management decisions.  A negative result may occur with  improper specimen collection / handling, submission of specimen other  than nasopharyngeal swab, presence of viral mutation(s) within the  areas targeted by this assay, and inadequate number of viral copies  (<250 copies / mL). A negative result must be combined with clinical  observations, patient history, and epidemiological information. If result is POSITIVE SARS-CoV-2 target nucleic acids are DETECTED. The SARS-CoV-2 RNA is generally detectable in upper and lower  respiratory specimens dur ing the acute phase of infection.  Positive  results are indicative of active infection with SARS-CoV-2.  Clinical  correlation with patient history and other diagnostic information is  necessary to determine patient infection status.  Positive results do  not rule out bacterial infection or co-infection with other viruses. If result is PRESUMPTIVE POSTIVE SARS-CoV-2 nucleic acids MAY BE PRESENT.   A presumptive positive result was obtained on the submitted specimen  and confirmed on repeat testing.  While 2019 novel coronavirus  (SARS-CoV-2) nucleic acids may be present in the submitted sample  additional confirmatory testing may be necessary for epidemiological  and / or clinical management purposes  to differentiate between  SARS-CoV-2 and other Sarbecovirus currently known to infect humans.  If clinically indicated additional testing with an alternate test  methodology [phone]) is advised. The SARS-CoV-2 RNA is generally  detectable in upper and lower respiratory sp ecimens during the acute  phase of infection. The expected result is Negative. Fact Sheet for Patients:  StrictlyIdeas.no Fact Sheet for Healthcare Providers:  BankingDealers.co.za This test is not yet approved or cleared by the [place] FDA and has been authorized for detection and/or diagnosis of SARS-CoV-2 by FDA under an Emergency Use Authorization (EUA).  This EUA will remain in effect (meaning this test can be used) for the duration of the COVID-19 declaration under Section 564(b)(1) of the Act, 21 U.S.C. section 360bbb-3(b)(1), unless the authorization is terminated or revoked sooner. Performed at [facility], [address] [place],  [address]   MRSA PCR Screening     Status: None   Collection Time: 11/07/18  6:09 AM   Specimen: Nasal Mucosa; Nasopharyngeal  Result Value Ref Range Status   MRSA by PCR NEGATIVE NEGATIVE Final  Comment:        The GeneXpert MRSA Assay (FDA approved for NASAL specimens only), is one component of a comprehensive MRSA colonization surveillance program. It is not intended to diagnose MRSA infection nor to guide or monitor treatment for MRSA infections. Performed at [facility], [address], [place], [place] [address]   Culture, blood (routine x 2)     Status: None   Collection Time: 11/07/18  7:38 AM   Specimen: Right Antecubital; Blood  Result Value Ref Range Status   Specimen Description   Final    RIGHT ANTECUBITAL BOTTLES DRAWN AEROBIC AND ANAEROBIC   Special Requests Blood Culture adequate volume  Final   Culture   Final    NO GROWTH 5 DAYS Performed at [facility], [address], [place], [place] [address]    Report Status 11/12/2018 FINAL  Final  Culture, blood (routine x 2)     Status: None   Collection Time: 11/07/18  7:38 AM   Specimen: BLOOD LEFT FOREARM  Result Value Ref Range Status   Specimen Description   Final    BLOOD LEFT FOREARM BOTTLES DRAWN AEROBIC AND ANAEROBIC   Special Requests Blood Culture adequate volume  Final   Culture   Final    NO GROWTH 5 DAYS Performed at [facility], [address], [place], [place] [address]     Report Status 11/12/2018 FINAL  Final         Radiology Studies: No results found.      Scheduled Meds: . aspirin EC  81 mg Oral Daily  . calcium acetate  667 mg Oral TID WC  . Chlorhexidine Gluconate Cloth  6 each Topical Q0600  . Chlorhexidine Gluconate Cloth  6 each Topical Q0600  . ciprofloxacin-dexamethasone  4 drop Left EAR BID  . darbepoetin (ARANESP) injection - DIALYSIS  100 mcg Intravenous Q Mon-HD  . docusate sodium  100 mg Oral Q12H  . febuxostat  40 mg Oral Daily  . feeding supplement (NEPRO CARB STEADY)  237 mL Oral Q24H  . gabapentin  300 mg Oral QHS  . heparin  5,000 Units Subcutaneous Q8H  . hydrALAZINE  37.5 mg Oral BID  . insulin aspart  0-5 Units Subcutaneous QHS  . insulin aspart  0-9 Units Subcutaneous TID WC  . mouth rinse  15 mL Mouth Rinse BID  . metoprolol tartrate  25 mg Oral BID  . pantoprazole  40 mg Oral QAC breakfast  . sodium chloride flush  3 mL Intravenous Q12H  . thyroid  120 mg Oral Q0600   Continuous Infusions: . sodium chloride       LOS: 7 days     Vernell Leep, MD, FACP, [facility]. [facility]  To contact the attending provider between 7A-7P or the covering provider during after hours 7P-7A, please log into the web site [email or web address] and access using universal [place] password for that web site. If you do not have the password, please call the hospital operator.  11/14/2018, 10:22 AM

## 2018-11-14 NOTE — Plan of Care (Signed)
?  Problem: Clinical Measurements: ?Goal: Ability to maintain clinical measurements within normal limits will improve ?Outcome: Progressing ?Goal: Will remain free from infection ?Outcome: Progressing ?Goal: Diagnostic test results will improve ?Outcome: Progressing ?  ?

## 2018-11-14 NOTE — Progress Notes (Addendum)
Progress Note  Patient Name: [name] Date of Encounter: 11/14/2018  Primary Cardiologist: Carlyle Dolly, MD   Subjective   No significant overnight events.  Patient states she is still feeling great this morning. No chest pain or shortness of breath. She states she is supposed to go for another round of dialysis today.   Inpatient Medications    Scheduled Meds: . aspirin EC  81 mg Oral Daily  . calcium acetate  667 mg Oral TID WC  . Chlorhexidine Gluconate Cloth  6 each Topical Q0600  . Chlorhexidine Gluconate Cloth  6 each Topical Q0600  . ciprofloxacin-dexamethasone  4 drop Left EAR BID  . darbepoetin (ARANESP) injection - DIALYSIS  100 mcg Intravenous Q Mon-HD  . docusate sodium  100 mg Oral Q12H  . febuxostat  40 mg Oral Daily  . feeding supplement (NEPRO CARB STEADY)  237 mL Oral Q24H  . gabapentin  300 mg Oral QHS  . heparin  5,000 Units Subcutaneous Q8H  . hydrALAZINE  37.5 mg Oral BID  . insulin aspart  0-5 Units Subcutaneous QHS  . insulin aspart  0-9 Units Subcutaneous TID WC  . mouth rinse  15 mL Mouth Rinse BID  . metoprolol tartrate  25 mg Oral BID  . pantoprazole  40 mg Oral QAC breakfast  . sodium chloride flush  3 mL Intravenous Q12H  . thyroid  120 mg Oral Q0600   Continuous Infusions: . sodium chloride     PRN Meds: sodium chloride, acetaminophen **OR** acetaminophen, heparin, lidocaine (PF), lidocaine-prilocaine, nitroGLYCERIN, ondansetron **OR** ondansetron (ZOFRAN) IV, pentafluoroprop-tetrafluoroeth, polyethylene glycol, sodium chloride flush   Vital Signs    Vitals:   11/13/18 1922 11/13/18 2244 11/14/18 0349 11/14/18 0817  BP: 108/68 (!) 126/55 (!) 142/61 (!) 126/45  Pulse: 73 72 64 68  Resp: 18  18   Temp: 98.5 F (36.9 C)  97.8 F (36.6 C) 98.7 F (37.1 C)  TempSrc: Oral   Oral  SpO2: 96%  95% 95%  Weight:   77.1 kg   Height:        Intake/Output Summary (Last 24 hours) at 11/14/2018 0900 Last data filed at 11/14/2018  0837 Gross per 24 hour  Intake 825 ml  Output 100 ml  Net 725 ml   Filed Weights   11/12/18 0741 11/13/18 0629 11/14/18 0349  Weight: 78.7 kg 76.7 kg 77.1 kg    Telemetry    Normal sinus rhythm with rates in the 60's to 80's. - Personally Reviewed  ECG    No new ECG tracing today. - Personally Reviewed  Physical Exam   GEN: Caucasian female resting comfortably. Alert and in no acute distress.   Neck: Supple.  Cardiac: RRR. No murmurs, gallops, or rubs.  Respiratory: Clear to auscultation bilaterally. No wheezes, rhonchi, or rales. GI: Abdomen soft, non-distended, and non-tender. Bowel sounds present. Extremities: No significant lower extremity edema. No deformity. Radial and distal pedal pulses 2+ bilaterally. Skin: Warm and dry. Neuro:  No focal deficits. Psych: Normal affect. Responds appropriately.  Labs    Chemistry Recent Labs  Lab 11/08/18 0433 11/10/18 0429 11/12/18 0822 11/13/18 0421 11/14/18 0448  NA 139 138 138 137  --   K 4.6 4.2 3.8 4.3  --   CL 101 99 100 100  --   CO2 24 24 27 26   --   GLUCOSE 97 99 115* 98  --   BUN 64* 64* 48* 22  --   CREATININE 4.64* 4.43*  4.27* 3.30* 2.36* 4.01*  CALCIUM 8.1* 7.9* 8.5* 8.5*  --   ALBUMIN 3.0* 2.6* 2.6*  --   --   GFRNONAA 8* 9*  9* 13* 19* 10*  GFRAA 10* 10*  11* 15* 22* 12*  ANIONGAP 14 15 11 11   --      Hematology Recent Labs  Lab 11/12/18 0847 11/13/18 0421 11/14/18 0448  WBC 5.9 6.9 7.6  RBC 2.95* 3.12* 2.95*  HGB 8.8* 9.4* 8.8*  HCT 28.3* 30.3* 28.5*  MCV 95.9 97.1 96.6  MCH 29.8 30.1 29.8  MCHC 31.1 31.0 30.9  RDW 14.1 14.3 14.2  PLT 263 268 308    Cardiac Enzymes Recent Labs  Lab 11/07/18 1125  TROPONINI 4.14*   No results for input(s): TROPIPOC in the last 168 hours.   BNPNo results for input(s): BNP, PROBNP in the last 168 hours.   DDimer  No results for input(s): DDIMER in the last 168 hours.   Radiology    No results found.  Cardiac Studies   Echocardiogram  11/10/2018: Impressions: 1. The left ventricle has normal systolic function, with an ejection fraction of 55-60%. The cavity size was normal. There is moderately increased left ventricular wall thickness. Left ventricular diastolic Doppler parameters are consistent with  impaired relaxation. Elevated mean left atrial pressure.  2. The right ventricle has normal systolic function. The cavity was normal. There is no increase in right ventricular wall thickness.  3. Left atrial size was moderately dilated.  4. Right atrial size was mildly dilated.  5. The aortic valve is tricuspid. Moderate thickening of the aortic valve. Moderate calcification of the aortic valve. No stenosis of the aortic valve. Moderate aortic annular calcification noted.  6. The mitral valve is abnormal. Moderate thickening of the mitral valve leaflet. Moderate calcification of the mitral valve leaflet. There is moderate mitral annular calcification present. No evidence of mitral valve stenosis.  7. The aortic root is normal in size and structure.  8. Pulmonary hypertension is mildly elevated, PASP is 34 mmHg. _______________  Left Heart Catheterization 11/12/2018:  Prox RCA lesion is 50% stenosed.  Mid RCA to Dist RCA lesion is 80% stenosed.  Dist RCA lesion is 100% stenosed.  Prox Cx lesion is 99% stenosed.  Mid Cx lesion is 99% stenosed.  Dist Cx lesion is 90% stenosed.  1st Mrg lesion is 90% stenosed.  Ost LAD to Prox LAD lesion is 70% stenosed.  Mid LAD lesion is 30% stenosed.   1. 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 Hold Plavix.   Patient Profile    Ms. Frisinger is a 78 y.o. 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 Troponin peaked at 4.14. Patient was started on hemodialysis due to uremia. Patient transferred to [facility] on 11/11/2018 for cardiac catheterization now that hemodialysis established.  Assessment & Plan    NSTEMI - EKG with non-specific ST/T changes. - Troponin peaked at 4.14. - Echo showed LVEF of  55-60% with grade 1 diastolic dysfunction. - Symptoms improved with hemodialysis. Currently chest pain free.  - Left heart catheterization on 6/17 showed severe triple vessel CAD with chronic occlusion of the distal RCA. CT surgery was consulted for possible CABG. Dr. Roxan Hockey saw patient and recommended CABG following Plavix washout. Last dose of Plavix was on 6/17 at 1319. Will likely need to wait 5-7 days. Decision has been made to keep patient in hospital until surgery given need for dialysis and current COVID-19 pandemic. - Continue medical therapy with Aspirin 81mg  daily and Lopressor 25mg  twice daily (convert to Toprol on d/c). Patient has an allergy to statins.  Acute on Chronic Combined CHF - Echo this admission showed showed LVEF of 55-60% with grade 1 diastolic dysfunction. - Volume being managed with hemodialysis. - Continue beta-blocker as above. Consider transitioning Lopressor to Toprol prior to discharge. - Continue Hydralazine. - Continue to monitor daily weights, strict I/O's, and renal function.   Hypertension - Most recent BP 126/45.  - Lopressor increased to 25mg  twice daily yesterday. - Continue Hydralazine 37.5mg  twice daily.  Hyperlipidemia - Most recent lipid panel in system from 05/2017: Total Cholesterol 526, Triglycerides 662, HDL 42, LDL unable to be calculated due to Triglyceride level.  - Will go ahead and recheck. - May need to consider PCSK9 inhibitor as an outpatient given statin intolerance.   Progressive CKD Stage IV - Started on hemodialysis this admission. - Management per Nephrology.  Anemia of Chronic Disease - Hemoglobin stable at 8.8 today.  - Management per primary team and Nephrology.  Possible Pneumonia - Chest CT on 11/07/2018 showed bilateral multifocal airspace consolidation and atelectasis consistent with multifocal pneumonia.  - Patient was afebrile with normal WBC. Breathing improved with dialysis so antibiotics were never started. - Repeat chest x-ray on 11/11/2018 showed bilateral pleural effusions (left > right) with associated atelectasis/consolidation). - Management per primary team.  Otherwise, per primary team.  For questions or updates, please contact [place] Please consult [email or web address] for contact info under Cardiology/STEMI.      Signed, Darreld Mclean, PA-C  11/14/2018, 9:00 AM   Pager: [phone]  ATTENDING ATTESTATION  I have seen, examined and evaluated the patient this AM along with Sande Rives, PA-C.  After reviewing all the available data and chart, we discussed the patients laboratory, study & physical findings as well as symptoms in detail. I agree with her findings, examination as well as impression recommendations as per our discussion.    She looks and feels much better today.  I updated her son last night.  Anticipate that she will be having her preop evaluation studies done while she is here over the weekend waiting for Plavix washout.  See yesterday's notes about logistical concerns with potential discharge home over the weekend and returning early next week for outpatient CABG given the current situation week with COVID-19 concerns and testing.    Glenetta Hew, M.D., M.S. Interventional Cardiologist   Pager # [phone] Phone # [phone] [address] [place], [place]  [address]

## 2018-11-14 NOTE — Progress Notes (Signed)
  [facility] Progress Note   Assessment/ Plan:    1. Severe 3 vessel CAD--> TCTS consulted, appears like plan is to likely remain inpt until CABG  2.ESRD: New start. Dialysis #1 11/07/2018 Dialysis #2 11/08/2018 Dialysis #3 11/10/2018.  HD #4 6/17.  Using 2 16 g needles, BFR 300.  CLIP'd to [facility] TTS 11:30.    3. 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 Nutrition: albumin 2.6, prostat  6. Hypertension: on metop, hydral  7.  Dispo: pending  Subjective:    Seen in room.  Feeling OK.  Nervous about upcoming CABG.  No complaints today.   Objective:   BP (!) 126/45 (BP Location: Left Leg)   Pulse 68   Temp 98.7 F (37.1 C) (Oral)   Resp 18   Ht 5\' 4"  (1.626 m)   Wt 77.1 kg Comment: scale c  SpO2 95%   BMI 29.18 kg/m   Physical Exam: Gen: older woman, NAD, sitting in bed CVS: RRR no m/r/g Resp: clear bilaterally no c/w/r Abd: soft nontender NABS Ext: trace LE edema ACCESS: AVF +T/B Labs: BMET Recent Labs  Lab 11/08/18 0433 11/10/18 0429 11/12/18 0822 11/13/18 0421 11/14/18 0448  NA 139 138 138 137  --   K 4.6 4.2 3.8 4.3  --   CL 101 99 100 100  --   CO2 24 24 27 26   --   GLUCOSE 97 99 115* 98  --   BUN 64* 64* 48* 22  --   CREATININE 4.64* 4.43*  4.27* 3.30* 2.36* 4.01*  CALCIUM 8.1* 7.9* 8.5* 8.5*  --   PHOS 5.6* 6.2* 4.6  --   --    CBC Recent Labs  Lab 11/10/18 0429 11/11/18 0519 11/12/18 0847 11/13/18 0421 11/14/18 0448  WBC 6.3 6.2 5.9 6.9 7.6  NEUTROABS 4.4 4.0  --  4.5 4.9  HGB 8.6* 8.7* 8.8* 9.4* 8.8*  HCT 28.3* 28.5* 28.3* 30.3* 28.5*  MCV 99.0 98.3 95.9 97.1 96.6  PLT 223 245 263 268 308    @IMGRELPRIORS @ Medications:    . aspirin EC  81 mg Oral Daily  . calcium acetate  667 mg Oral TID WC  . Chlorhexidine Gluconate Cloth  6 each Topical Q0600  . Chlorhexidine Gluconate Cloth  6 each Topical Q0600  . ciprofloxacin-dexamethasone  4 drop Left EAR BID   . darbepoetin (ARANESP) injection - DIALYSIS  100 mcg Intravenous Q Mon-HD  . docusate sodium  100 mg Oral Q12H  . febuxostat  40 mg Oral Daily  . feeding supplement (NEPRO CARB STEADY)  237 mL Oral Q24H  . gabapentin  300 mg Oral QHS  . heparin  5,000 Units Subcutaneous Q8H  . hydrALAZINE  37.5 mg Oral BID  . insulin aspart  0-5 Units Subcutaneous QHS  . insulin aspart  0-9 Units Subcutaneous TID WC  . mouth rinse  15 mL Mouth Rinse BID  . metoprolol tartrate  25 mg Oral BID  . pantoprazole  40 mg Oral QAC breakfast  . sodium chloride flush  3 mL Intravenous Q12H  . thyroid  120 mg Oral Q0600     Madelon Lips MD [facility] pgr [phone] 11/14/2018, 9:53 AM

## 2018-11-15 ENCOUNTER — Other Ambulatory Visit (HOSPITAL_COMMUNITY): Payer: Medicare HMO

## 2018-11-15 LAB — CBC WITH DIFFERENTIAL/PLATELET
Abs Immature Granulocytes: 0.04 10*3/uL (ref 0.00–0.07)
Basophils Absolute: 0.1 10*3/uL (ref 0.0–0.1)
Basophils Relative: 1 %
Eosinophils Absolute: 0.4 10*3/uL (ref 0.0–0.5)
Eosinophils Relative: 5 %
HCT: 29.2 % — ABNORMAL LOW (ref 36.0–46.0)
Hemoglobin: 9 g/dL — ABNORMAL LOW (ref 12.0–15.0)
Immature Granulocytes: 1 %
Lymphocytes Relative: 20 %
Lymphs Abs: 1.5 10*3/uL (ref 0.7–4.0)
MCH: 29.6 pg (ref 26.0–34.0)
MCHC: 30.8 g/dL (ref 30.0–36.0)
MCV: 96.1 fL (ref 80.0–100.0)
Monocytes Absolute: 0.9 10*3/uL (ref 0.1–1.0)
Monocytes Relative: 12 %
Neutro Abs: 4.5 10*3/uL (ref 1.7–7.7)
Neutrophils Relative %: 61 %
Platelets: 332 10*3/uL (ref 150–400)
RBC: 3.04 MIL/uL — ABNORMAL LOW (ref 3.87–5.11)
RDW: 14.5 % (ref 11.5–15.5)
WBC: 7.3 10*3/uL (ref 4.0–10.5)
nRBC: 0 % (ref 0.0–0.2)

## 2018-11-15 LAB — GLUCOSE, CAPILLARY
Glucose-Capillary: 105 mg/dL — ABNORMAL HIGH (ref 70–99)
Glucose-Capillary: 162 mg/dL — ABNORMAL HIGH (ref 70–99)
Glucose-Capillary: 165 mg/dL — ABNORMAL HIGH (ref 70–99)
Glucose-Capillary: 140 mg/dL — ABNORMAL HIGH (ref 70–99)

## 2018-11-15 NOTE — Progress Notes (Signed)
  [facility] Progress Note   Assessment/ Plan:    1. Severe 3 vessel CAD--> TCTS consulted, appears like plan is to likely remain inpt until CABG.  Plavix washout (last dose 6/17).    2.ESRD: New start. Dialysis #1 11/07/2018 Dialysis #2 11/08/2018 Dialysis #3 11/10/2018.  HD #4 6/17.  Last HD 6/19.  Using 2 16 g needles, BFR 300.  CLIP'd to [facility] TTS 11:30.  Next planned HD 6/22   3. 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 Nutrition: albumin 2.6, prostat  6. Hypertension: on metop, hydral  7.  Dispo: awaiting CABG  Subjective:    Seen in room.  Reports she had a "crying spell" yesterday and feels scared and lonely today.  CABG sometime next week   Objective:   BP (!) 141/45 (BP Location: Left Arm)   Pulse 67   Temp 98.5 F (36.9 C) (Oral)   Resp 16   Ht 5\' 4"  (1.626 m)   Wt 73.7 kg   SpO2 96%   BMI 27.89 kg/m   Physical Exam: Gen: older woman, NAD, sitting in bed CVS: RRR no m/r/g Resp: normal WOB, clear bilaterally no c/w/r Abd: soft nontender NABS Ext: trace LE edema ACCESS: R UE AVF +T/B Labs: BMET Recent Labs  Lab 11/10/18 0429 11/12/18 0822 11/13/18 0421 11/14/18 0448  NA 138 138 137  --   K 4.2 3.8 4.3  --   CL 99 100 100  --   CO2 24 27 26   --   GLUCOSE 99 115* 98  --   BUN 64* 48* 22  --   CREATININE 4.43*  4.27* 3.30* 2.36* 4.01*  CALCIUM 7.9* 8.5* 8.5*  --   PHOS 6.2* 4.6  --   --    CBC Recent Labs  Lab 11/11/18 0519 11/12/18 0847 11/13/18 0421 11/14/18 0448 11/15/18 0508  WBC 6.2 5.9 6.9 7.6 7.3  NEUTROABS 4.0  --  4.5 4.9 4.5  HGB 8.7* 8.8* 9.4* 8.8* 9.0*  HCT 28.5* 28.3* 30.3* 28.5* 29.2*  MCV 98.3 95.9 97.1 96.6 96.1  PLT 245 263 268 308 332    @IMGRELPRIORS @ Medications:    . aspirin EC  81 mg Oral Daily  . calcium acetate  667 mg Oral TID WC  . Chlorhexidine Gluconate Cloth  6 each Topical Q0600  . Chlorhexidine Gluconate  Cloth  6 each Topical Q0600  . ciprofloxacin-dexamethasone  4 drop Left EAR BID  . darbepoetin (ARANESP) injection - DIALYSIS  100 mcg Intravenous Q Mon-HD  . docusate sodium  100 mg Oral Q12H  . febuxostat  40 mg Oral Daily  . feeding supplement (NEPRO CARB STEADY)  237 mL Oral Q24H  . gabapentin  300 mg Oral QHS  . heparin  5,000 Units Subcutaneous Q8H  . hydrALAZINE  37.5 mg Oral BID  . insulin aspart  0-5 Units Subcutaneous QHS  . insulin aspart  0-9 Units Subcutaneous TID WC  . mouth rinse  15 mL Mouth Rinse BID  . metoprolol tartrate  25 mg Oral BID  . pantoprazole  40 mg Oral QAC breakfast  . sodium chloride flush  3 mL Intravenous Q12H  . thyroid  120 mg Oral Q0600     Madelon Lips MD [facility] pgr [phone] 11/15/2018, 11:22 AM

## 2018-11-15 NOTE — Progress Notes (Signed)
Progress Note  Patient Name: [name] Date of Encounter: 11/15/2018  Primary Cardiologist: Carlyle Dolly, MD   Subjective   No chest pain or sob.   Inpatient Medications    Scheduled Meds: . aspirin EC  81 mg Oral Daily  . calcium acetate  667 mg Oral TID WC  . Chlorhexidine Gluconate Cloth  6 each Topical Q0600  . Chlorhexidine Gluconate Cloth  6 each Topical Q0600  . ciprofloxacin-dexamethasone  4 drop Left EAR BID  . darbepoetin (ARANESP) injection - DIALYSIS  100 mcg Intravenous Q Mon-HD  . docusate sodium  100 mg Oral Q12H  . febuxostat  40 mg Oral Daily  . feeding supplement (NEPRO CARB STEADY)  237 mL Oral Q24H  . gabapentin  300 mg Oral QHS  . heparin  5,000 Units Subcutaneous Q8H  . hydrALAZINE  37.5 mg Oral BID  . insulin aspart  0-5 Units Subcutaneous QHS  . insulin aspart  0-9 Units Subcutaneous TID WC  . mouth rinse  15 mL Mouth Rinse BID  . metoprolol tartrate  25 mg Oral BID  . pantoprazole  40 mg Oral QAC breakfast  . sodium chloride flush  3 mL Intravenous Q12H  . thyroid  120 mg Oral Q0600   Continuous Infusions: . sodium chloride     PRN Meds: sodium chloride, acetaminophen **OR** acetaminophen, heparin, lidocaine (PF), lidocaine-prilocaine, nitroGLYCERIN, ondansetron **OR** ondansetron (ZOFRAN) IV, pentafluoroprop-tetrafluoroeth, polyethylene glycol, sodium chloride flush   Vital Signs    Vitals:   11/14/18 1800 11/14/18 1826 11/14/18 2106 11/15/18 0555  BP: (!) 157/62 123/83 (!) 149/55 (!) 141/45  Pulse: 66 68 79 67  Resp: 17 20 16 16   Temp:  99 F (37.2 C) 98.6 F (37 C) 98.5 F (36.9 C)  TempSrc:  Oral Oral Oral  SpO2:  96% 97% 96%  Weight:  76 kg  73.7 kg  Height:        Intake/Output Summary (Last 24 hours) at 11/15/2018 1227 Last data filed at 11/15/2018 0900 Gross per 24 hour  Intake 480 ml  Output 2051 ml  Net -1571 ml   Filed Weights   11/14/18 1418 11/14/18 1826 11/15/18 0555  Weight: 78.4 kg 76 kg 73.7 kg     Telemetry    nsr - Personally Reviewed  ECG    none - Personally Reviewed  Physical Exam   GEN: chronically ill appearing no acute distress.   Neck: 6 cm JVD Cardiac: RRR, no murmurs, rubs, or gallops.  Respiratory: Clear to auscultation bilaterally. GI: Soft, nontender, non-distended  MS: No edema; No deformity. Neuro:  Nonfocal  Psych: Normal affect   Labs    Chemistry Recent Labs  Lab 11/10/18 0429 11/12/18 0822 11/13/18 0421 11/14/18 0448  NA 138 138 137  --   K 4.2 3.8 4.3  --   CL 99 100 100  --   CO2 24 27 26   --   GLUCOSE 99 115* 98  --   BUN 64* 48* 22  --   CREATININE 4.43*  4.27* 3.30* 2.36* 4.01*  CALCIUM 7.9* 8.5* 8.5*  --   ALBUMIN 2.6* 2.6*  --   --   GFRNONAA 9*  9* 13* 19* 10*  GFRAA 10*  11* 15* 22* 12*  ANIONGAP 15 11 11   --      Hematology Recent Labs  Lab 11/13/18 0421 11/14/18 0448 11/15/18 0508  WBC 6.9 7.6 7.3  RBC 3.12* 2.95* 3.04*  HGB 9.4* 8.8* 9.0*  HCT 30.3* 28.5* 29.2*  MCV 97.1 96.6 96.1  MCH 30.1 29.8 29.6  MCHC 31.0 30.9 30.8  RDW 14.3 14.2 14.5  PLT 268 308 332    Cardiac EnzymesNo results for input(s): TROPONINI in the last 168 hours. No results for input(s): TROPIPOC in the last 168 hours.   BNPNo results for input(s): BNP, PROBNP in the last 168 hours.   DDimer No results for input(s): DDIMER in the last 168 hours.   Radiology    No results found.  Cardiac Studies   none  Patient Profile     78 y.o. female admitted with ACS and underwent LHC with surgical disease pending CABG  Assessment & Plan    1. CAD - she awaits CABG next week. With the Covid pandemic, she will await surgery in the hospital. 2. ESRD - she is not having chest pain during HD. Continue.  3. HTN - continue current meds.      For questions or updates, please contact [place] Please consult [email or web address] for contact info under Cardiology/STEMI.      Signed, Cristopher Peru, MD  11/15/2018, 12:27 PM  Patient ID: [name], female   DOB: 08/11/1940, 78 y.o.   MRN: [id number]

## 2018-11-15 NOTE — Progress Notes (Signed)
PROGRESS NOTE   [name]  WGN:[id number]    DOB: 1941/01/05    DOA: 11/06/2018  PCP: Doree Albee, MD   I have briefly reviewed patients previous medical records in [facility].  Brief Narrative:  78 year old female with PMH of DM 2, HTN, CKD 5, CAD s/p STEMI treated medically due to advanced CKD and anemia, gout, chronic combined CHF, hypothyroid, presented to ED due to chest pain and dyspnea.  She was diagnosed with NSTEMI, acute on chronic diastolic CHF, progressive CKD to ESRD.  Nephrology consulted and HD initiated this admission.  Cardiology consulted, cardiac cath 6/17 showed severe three-vessel disease, TCTS consulted and plan CABG after Plavix washout (last dose 11/12/2018).  Patient will stay in-house for CABG sometime next week.   Assessment & Plan:   Principal Problem:   Non-ST elevation (NSTEMI) myocardial infarction [facility]) Active Problems:   HTN (hypertension)   Hyperlipidemia associated with type 2 diabetes mellitus ([place])   Essential hypertension, benign   Hypothyroidism   ESRD on dialysis [facility])   NSTEMI/CAD  Cardiology follow-up appreciated.  Multiple prior admissions with NSTEMI in the setting of severe anemia.  She was treated medically in the past including DAPT and intervention was avoided primarily due to advanced CKD and high risk for pushing her into ESRD requiring HD and due to anemia.  DAPT had to be stopped in 10/2016 due to hemoglobin of 4.4.  Recurrent NSTEMI this admission with associated decompensated CHF and progressive CKD.  Troponin peaked to 4.14.  Continue aspirin, Plavix, metoprolol.  Intolerant to statins.  TTE: LVEF 55-60% and grade 1 diastolic dysfunction.  Clinically euvolemic after multiple HD this admission.  Chest pain resolved.  Cardiac cath 6/17 showed severe triple-vessel CAD with chronic occlusion of the distal RCA and thoracic surgery consulted.  TCTS plans CABG after Plavix washout (last dose 11/12/2018).     As discussed with Dr. Ellyn Hack on 6/18, patient to remain in the hospital until surgery next week given need for HD and current COVID-19 pandemic.  Awaiting surgery date from [place] [facility]  Nephrology follow-up appreciated.  It was felt that aspirin that was started for foot ulcer probably worsened her chronic kidney disease to ESRD.  Newly started HD this admission.  Has been dialyzed 5 times thus far (6/12, 6/13, 6/15, 6/17, 6/19).  Has been set up for OP HD on TTS schedule when discharged.  Hyperkalemia, metabolic acidosis and asterixis have resolved.  Clinically appears euvolemic at this time.  Stable  Acute on chronic diastolic CHF  TTE results as above  Volume managed by serial HD as noted above.  Compensated  Acute respiratory failure with hypoxia  Secondary to decompensated CHF.  Resolved.  Type II DM with renal complications  Reasonable inpatient control on SSI.  A1c 5.2 suggest good control.  Continue gabapentin for peripheral neuropathy.  Essential hypertension  Controlled on hydralazine 37.5 mg twice daily and metoprolol increased to 25 mg twice daily.  Controlled.  Hypothyroid  Clinically euthyroid.  Continue supplements.  TSH 0.326.  Consider repeating TSH in 4 weeks.  GERD  Continue PPI.  Anemia of ESRD  Prior history of GI bleed  S/p Aranesp on 6/15.  Hemoglobin stable in the high 8-9 range.  Periodically follow CBCs across HD.  Will need to monitor closely for GI bleed and/or worsening anemia while on dual antiplatelets.  Secondary hyperparathyroidism  Continue PhosLo.  Hyperlipidemia  Intolerant of statins.  Gout  No acute flare.  DVT prophylaxis: Subcutaneous heparin Code Status: Full Family Communication: I discussed in detail with patient's son on 6/19, updated care and answered questions. Disposition: To be determined pending CABG sometime next week.   Consultants:  Nephrology Cardiology TCTS  Procedures:   HD Cardiac cath 11/12/2018:   Prox RCA lesion is 50% stenosed.  Mid RCA to Dist RCA lesion is 80% stenosed.  Dist RCA lesion is 100% stenosed.  Prox Cx lesion is 99% stenosed.  Mid Cx lesion is 99% stenosed.  Dist Cx lesion is 90% stenosed.  1st Mrg lesion is 90% stenosed.  Ost LAD to Prox LAD lesion is 70% stenosed.  Mid LAD lesion is 30% stenosed.   1. 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 Hold Plavix.    Antimicrobials:  None   Subjective: No complaints reported.  No dyspnea or chest pain.  ROS: As above, otherwise negative  Objective:  Vitals:   11/14/18 1800 11/14/18 1826 11/14/18 2106 11/15/18 0555  BP: (!) 157/62 123/83 (!) 149/55 (!) 141/45  Pulse: 66 68 79 67  Resp: 17 20 16 16   Temp:  99 F (37.2 C) 98.6 F (37 C) 98.5 F (36.9 C)  TempSrc:  Oral Oral Oral  SpO2:  96% 97% 96%  Weight:  76 kg  73.7 kg  Height:        Examination:  General exam: Pleasant elderly female, moderately built and nourished lying comfortably supine in bed.  Oral mucosa moist. Respiratory system: Clear to auscultation.  No increased work of breathing.  Stable Cardiovascular system: S1 & S2 heard, RRR. No JVD, murmurs, rubs, gallops or clicks. No pedal edema.  Telemetry personally reviewed: Sinus rhythm. Gastrointestinal system: Abdomen is nondistended, soft and nontender. No organomegaly or masses felt. Normal bowel sounds heard.  Stable. Central nervous system: Alert and oriented. No focal neurological deficits. Extremities: Symmetric 5 x 5 power.  RUE AVF +. Skin: No rashes, lesions or ulcers Psychiatry: Judgement and insight appear normal. Mood & affect: Appears to be in good spirits.     Data Reviewed: I have personally reviewed following labs and imaging studies  CBC: Recent Labs  Lab 11/10/18 0429  11/11/18 0519 11/12/18 0847 11/13/18 0421 11/14/18 0448 11/15/18 0508  WBC 6.3 6.2 5.9 6.9 7.6 7.3  NEUTROABS 4.4 4.0  --  4.5 4.9 4.5  HGB 8.6* 8.7* 8.8* 9.4* 8.8* 9.0*  HCT 28.3* 28.5* 28.3* 30.3* 28.5* 29.2*  MCV 99.0 98.3 95.9 97.1 96.6 96.1  PLT 223 245 263 268 308 294   Basic Metabolic Panel: Recent Labs  Lab 11/10/18 0429 11/12/18 0822 11/13/18 0421 11/14/18 0448  NA 138 138 137  --   K 4.2 3.8 4.3  --   CL 99 100 100  --   CO2 24 27 26   --   GLUCOSE 99 115* 98  --   BUN 64* 48* 22  --   CREATININE 4.43*  4.27* 3.30* 2.36* 4.01*  CALCIUM 7.9* 8.5* 8.5*  --   PHOS 6.2* 4.6  --   --    Liver Function Tests: Recent Labs  Lab 11/10/18 0429 11/12/18 0822  ALBUMIN 2.6* 2.6*   Coagulation Profile: Recent Labs  Lab 11/12/18 0847  INR 1.1   Cardiac Enzymes: No results for input(s): CKTOTAL, CKMB, CKMBINDEX, TROPONINI in the last 168 hours. CBG: Recent Labs  Lab 11/13/18 2055 11/14/18 0630 11/14/18 1139 11/14/18 2104 11/15/18 0556  GLUCAP 169* 111* 152* 202* 105*    Recent Results (from the past 240 hour(s))  SARS Coronavirus 2 (CEPHEID - Performed in [place] hospital lab), Hosp Order     Status: None   Collection Time: 11/06/18  1:30 PM   Specimen: Nasopharyngeal Swab  Result Value Ref Range Status   SARS Coronavirus 2 NEGATIVE NEGATIVE Final    Comment: (NOTE) If result is NEGATIVE SARS-CoV-2 target nucleic acids are NOT DETECTED. The SARS-CoV-2 RNA is generally detectable in upper and lower  respiratory specimens during the acute phase of infection. The lowest  concentration of SARS-CoV-2 viral copies this assay can detect is 250  copies / mL. A negative result does not preclude SARS-CoV-2 infection  and should not be used as the sole basis for treatment or other  patient management decisions.  A negative result may occur with  improper specimen collection / handling, submission of specimen other  than nasopharyngeal swab, presence of viral  mutation(s) within the  areas targeted by this assay, and inadequate number of viral copies  (<250 copies / mL). A negative result must be combined with clinical  observations, patient history, and epidemiological information. If result is POSITIVE SARS-CoV-2 target nucleic acids are DETECTED. The SARS-CoV-2 RNA is generally detectable in upper and lower  respiratory specimens dur ing the acute phase of infection.  Positive  results are indicative of active infection with SARS-CoV-2.  Clinical  correlation with patient history and other diagnostic information is  necessary to determine patient infection status.  Positive results do  not rule out bacterial infection or co-infection with other viruses. If result is PRESUMPTIVE POSTIVE SARS-CoV-2 nucleic acids MAY BE PRESENT.   A presumptive positive result was obtained on the submitted specimen  and confirmed on repeat testing.  While 2019 novel coronavirus  (SARS-CoV-2) nucleic acids may be present in the submitted sample  additional confirmatory testing may be necessary for epidemiological  and / or clinical management purposes  to differentiate between  SARS-CoV-2 and other Sarbecovirus currently known to infect humans.  If clinically indicated additional testing with an alternate test  methodology [phone]) is advised. The SARS-CoV-2 RNA is generally  detectable in upper and lower respiratory sp ecimens during the acute  phase of infection. The expected result is Negative. Fact Sheet for Patients:  StrictlyIdeas.no Fact Sheet for Healthcare Providers: BankingDealers.co.za This test is not yet approved or cleared by the [place] FDA and has been authorized for detection and/or diagnosis of SARS-CoV-2 by FDA under an Emergency Use Authorization (EUA).  This EUA will remain in effect (meaning this test can be used) for the duration of the COVID-19 declaration under Section 564(b)(1)  of the Act, 21 U.S.C. section 360bbb-3(b)(1), unless the authorization is terminated or revoked sooner. Performed at [facility], [address] [place], [place] [address]   MRSA PCR Screening     Status: None   Collection Time: 11/07/18  6:09 AM   Specimen: Nasal Mucosa; Nasopharyngeal  Result Value Ref Range Status   MRSA by PCR NEGATIVE NEGATIVE Final    Comment:        The GeneXpert MRSA Assay (FDA approved for NASAL specimens only), is one component of a comprehensive MRSA colonization surveillance program. It is not intended to diagnose MRSA infection nor to guide or monitor treatment for MRSA infections. Performed at [facility], [address] [place],  [address]   Culture, blood (  routine x 2)     Status: None   Collection Time: 11/07/18  7:38 AM   Specimen: Right Antecubital; Blood  Result Value Ref Range Status   Specimen Description   Final    RIGHT ANTECUBITAL BOTTLES DRAWN AEROBIC AND ANAEROBIC   Special Requests Blood Culture adequate volume  Final   Culture   Final    NO GROWTH 5 DAYS Performed at [facility], [address], [place], [place] [address]    Report Status 11/12/2018 FINAL  Final  Culture, blood (routine x 2)     Status: None   Collection Time: 11/07/18  7:38 AM   Specimen: BLOOD LEFT FOREARM  Result Value Ref Range Status   Specimen Description   Final    BLOOD LEFT FOREARM BOTTLES DRAWN AEROBIC AND ANAEROBIC   Special Requests Blood Culture adequate volume  Final   Culture   Final    NO GROWTH 5 DAYS Performed at [facility], [address], [place], [place] [address]    Report Status 11/12/2018 FINAL  Final         Radiology Studies: No results found.      Scheduled Meds: . aspirin EC  81 mg Oral Daily  . calcium acetate  667 mg Oral TID WC  . Chlorhexidine Gluconate Cloth  6 each Topical Q0600  . Chlorhexidine Gluconate Cloth  6 each Topical Q0600  . ciprofloxacin-dexamethasone  4 drop Left EAR BID  .  darbepoetin (ARANESP) injection - DIALYSIS  100 mcg Intravenous Q Mon-HD  . docusate sodium  100 mg Oral Q12H  . febuxostat  40 mg Oral Daily  . feeding supplement (NEPRO CARB STEADY)  237 mL Oral Q24H  . gabapentin  300 mg Oral QHS  . heparin  5,000 Units Subcutaneous Q8H  . hydrALAZINE  37.5 mg Oral BID  . insulin aspart  0-5 Units Subcutaneous QHS  . insulin aspart  0-9 Units Subcutaneous TID WC  . mouth rinse  15 mL Mouth Rinse BID  . metoprolol tartrate  25 mg Oral BID  . pantoprazole  40 mg Oral QAC breakfast  . sodium chloride flush  3 mL Intravenous Q12H  . thyroid  120 mg Oral Q0600   Continuous Infusions: . sodium chloride       LOS: 8 days     Vernell Leep, MD, FACP, [facility]. [facility]  To contact the attending provider between 7A-7P or the covering provider during after hours 7P-7A, please log into the web site [email or web address] and access using universal [place] password for that web site. If you do not have the password, please call the hospital operator.  11/15/2018, 9:16 AM

## 2018-11-15 NOTE — Plan of Care (Signed)
  Problem: Health Behavior/Discharge Planning: Goal: Ability to manage health-related needs will improve Outcome: Progressing   Problem: Clinical Measurements: Goal: Ability to maintain clinical measurements within normal limits will improve Outcome: Progressing Goal: Will remain free from infection Outcome: Progressing Goal: Diagnostic test results will improve Outcome: Progressing Goal: Respiratory complications will improve Outcome: Progressing Goal: Cardiovascular complication will be avoided Outcome: Progressing   Problem: Activity: Goal: Risk for activity intolerance will decrease Outcome: Progressing   Problem: Nutrition: Goal: Adequate nutrition will be maintained Outcome: Progressing   Problem: Coping: Goal: Level of anxiety will decrease Outcome: Progressing   Problem: Elimination: Goal: Will not experience complications related to bowel motility Outcome: Progressing Goal: Will not experience complications related to urinary retention Outcome: Progressing   Problem: Pain Managment: Goal: General experience of comfort will improve Outcome: Progressing   Problem: Safety: Goal: Ability to remain free from injury will improve Outcome: Progressing   Problem: Skin Integrity: Goal: Risk for impaired skin integrity will decrease Outcome: Progressing   Problem: Education: Goal: Knowledge of General Education information will improve Description: Including pain rating scale, medication(s)/side effects and non-pharmacologic comfort measures Outcome: Progressing   Problem: Health Behavior/Discharge Planning: Goal: Ability to manage health-related needs will improve Outcome: Progressing   Problem: Clinical Measurements: Goal: Ability to maintain clinical measurements within normal limits will improve Outcome: Progressing Goal: Will remain free from infection Outcome: Progressing Goal: Diagnostic test results will improve Outcome: Progressing Goal:  Respiratory complications will improve Outcome: Progressing Goal: Cardiovascular complication will be avoided Outcome: Progressing   Problem: Activity: Goal: Risk for activity intolerance will decrease Outcome: Progressing   Problem: Nutrition: Goal: Adequate nutrition will be maintained Outcome: Progressing   Problem: Coping: Goal: Level of anxiety will decrease Outcome: Progressing   Problem: Elimination: Goal: Will not experience complications related to bowel motility Outcome: Progressing Goal: Will not experience complications related to urinary retention Outcome: Progressing   Problem: Pain Managment: Goal: General experience of comfort will improve Outcome: Progressing   Problem: Safety: Goal: Ability to remain free from injury will improve Outcome: Progressing   Problem: Skin Integrity: Goal: Risk for impaired skin integrity will decrease Outcome: Progressing   Problem: Education: Goal: Understanding of CV disease, CV risk reduction, and recovery process will improve Outcome: Progressing Goal: Individualized Educational Video(s) Outcome: Progressing   Problem: Activity: Goal: Ability to return to baseline activity level will improve Outcome: Progressing   Problem: Cardiovascular: Goal: Ability to achieve and maintain adequate cardiovascular perfusion will improve Outcome: Progressing Goal: Vascular access site(s) Level 0-1 will be maintained Outcome: Progressing   Problem: Health Behavior/Discharge Planning: Goal: Ability to safely manage health-related needs after discharge will improve Outcome: Progressing

## 2018-11-16 ENCOUNTER — Other Ambulatory Visit (HOSPITAL_COMMUNITY): Payer: Medicare HMO

## 2018-11-16 LAB — CBC WITH DIFFERENTIAL/PLATELET
Abs Immature Granulocytes: 0.04 10*3/uL (ref 0.00–0.07)
Basophils Absolute: 0.1 10*3/uL (ref 0.0–0.1)
Basophils Relative: 1 %
Eosinophils Absolute: 0.4 10*3/uL (ref 0.0–0.5)
Eosinophils Relative: 7 %
HCT: 28.3 % — ABNORMAL LOW (ref 36.0–46.0)
Hemoglobin: 8.8 g/dL — ABNORMAL LOW (ref 12.0–15.0)
Immature Granulocytes: 1 %
Lymphocytes Relative: 20 %
Lymphs Abs: 1.2 10*3/uL (ref 0.7–4.0)
MCH: 29.7 pg (ref 26.0–34.0)
MCHC: 31.1 g/dL (ref 30.0–36.0)
MCV: 95.6 fL (ref 80.0–100.0)
Monocytes Absolute: 0.8 10*3/uL (ref 0.1–1.0)
Monocytes Relative: 14 %
Neutro Abs: 3.6 10*3/uL (ref 1.7–7.7)
Neutrophils Relative %: 57 %
Platelets: 327 10*3/uL (ref 150–400)
RBC: 2.96 MIL/uL — ABNORMAL LOW (ref 3.87–5.11)
RDW: 14.4 % (ref 11.5–15.5)
WBC: 6.2 10*3/uL (ref 4.0–10.5)
nRBC: 0 % (ref 0.0–0.2)

## 2018-11-16 LAB — GLUCOSE, CAPILLARY
Glucose-Capillary: 108 mg/dL — ABNORMAL HIGH (ref 70–99)
Glucose-Capillary: 133 mg/dL — ABNORMAL HIGH (ref 70–99)
Glucose-Capillary: 134 mg/dL — ABNORMAL HIGH (ref 70–99)
Glucose-Capillary: 113 mg/dL — ABNORMAL HIGH (ref 70–99)

## 2018-11-16 NOTE — Progress Notes (Signed)
PROGRESS NOTE   [name]  JAS:[id number]    DOB: Jun 05, 1940    DOA: 11/06/2018  PCP: Doree Albee, MD   I have briefly reviewed patients previous medical records in [facility].  Brief Narrative:  78 year old female with PMH of DM 2, HTN, CKD 5, CAD s/p STEMI treated medically due to advanced CKD and anemia, gout, chronic combined CHF, hypothyroid, presented to ED due to chest pain and dyspnea.  She was diagnosed with NSTEMI, acute on chronic diastolic CHF, progressive CKD to ESRD.  Nephrology consulted and HD initiated this admission.  Cardiology consulted, cardiac cath 6/17 showed severe three-vessel disease, TCTS consulted and plan CABG after Plavix washout (last dose 11/12/2018).  Patient will stay in-house for CABG sometime next week.  Stable.   Assessment & Plan:   Principal Problem:   Non-ST elevation (NSTEMI) myocardial infarction [facility]) Active Problems:   HTN (hypertension)   Hyperlipidemia associated with type 2 diabetes mellitus ([place])   Essential hypertension, benign   Hypothyroidism   ESRD on dialysis [facility])   NSTEMI/CAD  Cardiology continues to follow.  Multiple prior admissions with NSTEMI in the setting of severe anemia.  She was treated medically in the past including DAPT and intervention was avoided primarily due to advanced CKD and high risk for pushing her into ESRD requiring HD and due to anemia.  DAPT had to be stopped in 10/2016 due to hemoglobin of 4.4.  Recurrent NSTEMI this admission with associated decompensated CHF and progressive CKD.  Troponin peaked to 4.14.  Continue aspirin, Plavix, metoprolol.  Intolerant to statins.  TTE: LVEF 55-60% and grade 1 diastolic dysfunction.  Clinically euvolemic after multiple HD this admission.  Chest pain resolved.  Cardiac cath 6/17 showed severe triple-vessel CAD with chronic occlusion of the distal RCA and thoracic surgery consulted.  TCTS plans CABG after Plavix washout (last dose 11/12/2018).     As discussed with Dr. Ellyn Hack on 6/18, patient to remain in the hospital until surgery next week given need for HD and current COVID-19 pandemic.  Awaiting surgery date from [place] ESRD  Nephrology continues to follow for HD needs.  It was felt that aspirin that was started for foot ulcer probably worsened her chronic kidney disease to ESRD.  Newly started HD this admission.  Has been dialyzed 5 times thus far (6/12, 6/13, 6/15, 6/17, 6/19).  Has been set up for OP HD on TTS schedule when discharged.  Hyperkalemia, metabolic acidosis and asterixis have resolved.  Clinically appears euvolemic at this time.  Stable.  Next HD on 6/22.  Acute on chronic diastolic CHF  TTE results as above  Volume managed by serial HD as noted above.  Compensated  Acute respiratory failure with hypoxia  Secondary to decompensated CHF.  Resolved.  Type II DM with renal complications  Reasonable inpatient control on SSI.  A1c 5.2 suggest good control.  Continue gabapentin for peripheral neuropathy.  Essential hypertension  Controlled on hydralazine 37.5 mg twice daily and metoprolol increased to 25 mg twice daily.  Controlled.  Hypothyroid  Clinically euthyroid.  Continue supplements.  TSH 0.326.  Consider repeating TSH in 4 weeks.  GERD  Continue PPI.  Anemia of ESRD  Prior history of GI bleed  S/p Aranesp on 6/15.  Hemoglobin stable in the high 8-9 range.  Periodically follow CBCs across HD.  Will need to monitor closely for GI bleed and/or worsening anemia while on dual antiplatelets.  Secondary hyperparathyroidism  Continue PhosLo.  Hyperlipidemia  Intolerant of statins.  Gout  No acute flare.   DVT prophylaxis: Subcutaneous heparin Code Status: Full Family Communication: I discussed in detail with patient's son on 6/19, updated care and answered questions. Disposition: To be determined pending CABG sometime next week.   Consultants:  Nephrology  Cardiology TCTS  Procedures:  HD Cardiac cath 11/12/2018:   Prox RCA lesion is 50% stenosed.  Mid RCA to Dist RCA lesion is 80% stenosed.  Dist RCA lesion is 100% stenosed.  Prox Cx lesion is 99% stenosed.  Mid Cx lesion is 99% stenosed.  Dist Cx lesion is 90% stenosed.  1st Mrg lesion is 90% stenosed.  Ost LAD to Prox LAD lesion is 70% stenosed.  Mid LAD lesion is 30% stenosed.   1. 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 Hold Plavix.    Antimicrobials:  None   Subjective: Denies complaints.  "I am fine".  No chest pain or dyspnea reported.  ROS: As above, otherwise negative  Objective:  Vitals:   11/15/18 1959 11/16/18 0307 11/16/18 0439 11/16/18 0958  BP: (!) 164/58  (!) 172/43 (!) 127/51  Pulse: 70  (!) 58 66  Resp:   18   Temp: 98 F (36.7 C)  98.9 F (37.2 C)   TempSrc: Oral  Oral   SpO2: 98%  93%   Weight:  76.3 kg    Height:        Examination:  General exam: Pleasant elderly female, moderately built and nourished sitting up comfortably in chair this morning. Respiratory system: Clear to auscultation.  No increased work of breathing.  Stable Cardiovascular system: S1 & S2 heard, RRR. No JVD, murmurs, rubs, gallops or clicks. No pedal edema.  Telemetry personally reviewed: Sinus rhythm. Gastrointestinal system: Abdomen is nondistended, soft and nontender. No organomegaly or masses felt. Normal bowel sounds heard.  Stable. Central nervous system: Alert and oriented. No focal neurological deficits. Extremities: Symmetric 5 x 5 power.  RUE AVF +. Skin: No rashes, lesions or ulcers Psychiatry: Judgement and insight appear normal. Mood & affect: Appears to be in good spirits.     Data Reviewed: I have personally reviewed following labs and imaging studies  CBC: Recent Labs  Lab 11/11/18  0519 11/12/18 0847 11/13/18 0421 11/14/18 0448 11/15/18 0508 11/16/18 0703  WBC 6.2 5.9 6.9 7.6 7.3 6.2  NEUTROABS 4.0  --  4.5 4.9 4.5 3.6  HGB 8.7* 8.8* 9.4* 8.8* 9.0* 8.8*  HCT 28.5* 28.3* 30.3* 28.5* 29.2* 28.3*  MCV 98.3 95.9 97.1 96.6 96.1 95.6  PLT 245 263 268 308 332 235   Basic Metabolic Panel: Recent Labs  Lab 11/10/18 0429 11/12/18 0822 11/13/18 0421 11/14/18 0448  NA 138 138 137  --   K 4.2 3.8 4.3  --   CL 99 100 100  --   CO2 24 27 26   --   GLUCOSE 99 115* 98  --   BUN 64* 48* 22  --   CREATININE 4.43*  4.27* 3.30* 2.36* 4.01*  CALCIUM 7.9* 8.5* 8.5*  --   PHOS 6.2* 4.6  --   --    Liver Function Tests: Recent Labs  Lab 11/10/18 0429 11/12/18 0822  ALBUMIN 2.6* 2.6*   Coagulation Profile: Recent Labs  Lab 11/12/18 0847  INR 1.1   Cardiac Enzymes: No results for input(s): CKTOTAL, CKMB,  CKMBINDEX, TROPONINI in the last 168 hours. CBG: Recent Labs  Lab 11/15/18 0556 11/15/18 1251 11/15/18 1811 11/15/18 2106 11/16/18 0600  GLUCAP 105* 162* 165* 140* 108*    Recent Results (from the past 240 hour(s))  SARS Coronavirus 2 (CEPHEID - Performed in [place] hospital lab), Hosp Order     Status: None   Collection Time: 11/06/18  1:30 PM   Specimen: Nasopharyngeal Swab  Result Value Ref Range Status   SARS Coronavirus 2 NEGATIVE NEGATIVE Final    Comment: (NOTE) If result is NEGATIVE SARS-CoV-2 target nucleic acids are NOT DETECTED. The SARS-CoV-2 RNA is generally detectable in upper and lower  respiratory specimens during the acute phase of infection. The lowest  concentration of SARS-CoV-2 viral copies this assay can detect is 250  copies / mL. A negative result does not preclude SARS-CoV-2 infection  and should not be used as the sole basis for treatment or other  patient management decisions.  A negative result may occur with  improper specimen collection / handling, submission of specimen other  than nasopharyngeal swab, presence of  viral mutation(s) within the  areas targeted by this assay, and inadequate number of viral copies  (<250 copies / mL). A negative result must be combined with clinical  observations, patient history, and epidemiological information. If result is POSITIVE SARS-CoV-2 target nucleic acids are DETECTED. The SARS-CoV-2 RNA is generally detectable in upper and lower  respiratory specimens dur ing the acute phase of infection.  Positive  results are indicative of active infection with SARS-CoV-2.  Clinical  correlation with patient history and other diagnostic information is  necessary to determine patient infection status.  Positive results do  not rule out bacterial infection or co-infection with other viruses. If result is PRESUMPTIVE POSTIVE SARS-CoV-2 nucleic acids MAY BE PRESENT.   A presumptive positive result was obtained on the submitted specimen  and confirmed on repeat testing.  While 2019 novel coronavirus  (SARS-CoV-2) nucleic acids may be present in the submitted sample  additional confirmatory testing may be necessary for epidemiological  and / or clinical management purposes  to differentiate between  SARS-CoV-2 and other Sarbecovirus currently known to infect humans.  If clinically indicated additional testing with an alternate test  methodology [phone]) is advised. The SARS-CoV-2 RNA is generally  detectable in upper and lower respiratory sp ecimens during the acute  phase of infection. The expected result is Negative. Fact Sheet for Patients:  StrictlyIdeas.no Fact Sheet for Healthcare Providers: BankingDealers.co.za This test is not yet approved or cleared by the [place] FDA and has been authorized for detection and/or diagnosis of SARS-CoV-2 by FDA under an Emergency Use Authorization (EUA).  This EUA will remain in effect (meaning this test can be used) for the duration of the COVID-19 declaration under Section  564(b)(1) of the Act, 21 U.S.C. section 360bbb-3(b)(1), unless the authorization is terminated or revoked sooner. Performed at [facility], [address], [place], [place] [address]   MRSA PCR Screening     Status: None   Collection Time: 11/07/18  6:09 AM   Specimen: Nasal Mucosa; Nasopharyngeal  Result Value Ref Range Status   MRSA by PCR NEGATIVE NEGATIVE Final    Comment:        The GeneXpert MRSA Assay (FDA approved for NASAL specimens only), is one component of a comprehensive MRSA colonization surveillance program. It is not intended to diagnose MRSA infection nor to guide or monitor treatment for MRSA infections. Performed at [facility], [address] [place], [place] [address]   Culture, blood (routine x 2)     Status: None   Collection Time: 11/07/18  7:38 AM   Specimen: Right Antecubital; Blood  Result Value Ref Range Status   Specimen Description   Final    RIGHT ANTECUBITAL BOTTLES DRAWN AEROBIC AND ANAEROBIC   Special Requests Blood Culture adequate volume  Final   Culture   Final    NO GROWTH 5 DAYS Performed at [facility], [address], [place], [place] [address]    Report Status 11/12/2018 FINAL  Final  Culture, blood (routine x 2)     Status: None   Collection Time: 11/07/18  7:38 AM   Specimen: BLOOD LEFT FOREARM  Result Value Ref Range Status   Specimen Description   Final    BLOOD LEFT FOREARM BOTTLES DRAWN AEROBIC AND ANAEROBIC   Special Requests Blood Culture adequate volume  Final   Culture   Final    NO GROWTH 5 DAYS Performed at [facility], [address], [place], [place] [address]    Report Status 11/12/2018 FINAL  Final         Radiology Studies: No results found.      Scheduled Meds: . aspirin EC  81 mg Oral Daily  . calcium acetate  667 mg Oral TID WC  . Chlorhexidine Gluconate Cloth  6 each Topical Q0600  . Chlorhexidine Gluconate Cloth  6 each Topical Q0600  . ciprofloxacin-dexamethasone  4 drop Left EAR BID  .  darbepoetin (ARANESP) injection - DIALYSIS  100 mcg Intravenous Q Mon-HD  . docusate sodium  100 mg Oral Q12H  . febuxostat  40 mg Oral Daily  . feeding supplement (NEPRO CARB STEADY)  237 mL Oral Q24H  . gabapentin  300 mg Oral QHS  . heparin  5,000 Units Subcutaneous Q8H  . hydrALAZINE  37.5 mg Oral BID  . insulin aspart  0-5 Units Subcutaneous QHS  . insulin aspart  0-9 Units Subcutaneous TID WC  . mouth rinse  15 mL Mouth Rinse BID  . metoprolol tartrate  25 mg Oral BID  . pantoprazole  40 mg Oral QAC breakfast  . sodium chloride flush  3 mL Intravenous Q12H  . thyroid  120 mg Oral Q0600   Continuous Infusions: . sodium chloride       LOS: 9 days     Vernell Leep, MD, FACP, [facility]. [facility]  To contact the attending provider between 7A-7P or the covering provider during after hours 7P-7A, please log into the web site [email or web address] and access using universal [place] password for that web site. If you do not have the password, please call the hospital operator.  11/16/2018, 11:07 AM

## 2018-11-16 NOTE — Progress Notes (Signed)
  [facility] Progress Note   Assessment/ Plan:    1. Severe 3 vessel CAD--> TCTS consulted, appears like plan is to likely remain inpt until CABG.  Scheduled for 6/25.  Plavix washout (last dose 6/17).    2.ESRD: New start. Dialysis #1 11/07/2018 Dialysis #2 11/08/2018 Dialysis #3 11/10/2018.  HD #4 6/17.  Last HD 6/19.  Using 2 16 g needles, BFR 300.  CLIP'd to [facility] TTS 11:30.  Next planned HD 6/22, keep on MWF schedule while inpatient as CABG next Thursday.  3. 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 Nutrition: albumin 2.6, prostat  6. Hypertension: on metop, hydral  7.  Dispo: awaiting CABG  Subjective:    [name] was yesterday! Got flowers from her son in [place].  For CABG on Thursday.     Objective:   BP (!) 127/51   Pulse 66   Temp 98.9 F (37.2 C) (Oral)   Resp 18   Ht 5\' 4"  (1.626 m)   Wt 76.3 kg   SpO2 93%   BMI 28.87 kg/m   Physical Exam: Gen: older woman, NAD, sitting in chair CVS: RRR no m/r/g Resp: normal WOB, bibasilar breath sounds Abd: soft nontender NABS Ext: trace LE edema ACCESS: R UE AVF +T/B  Labs: BMET Recent Labs  Lab 11/10/18 0429 11/12/18 0822 11/13/18 0421 11/14/18 0448  NA 138 138 137  --   K 4.2 3.8 4.3  --   CL 99 100 100  --   CO2 24 27 26   --   GLUCOSE 99 115* 98  --   BUN 64* 48* 22  --   CREATININE 4.43*  4.27* 3.30* 2.36* 4.01*  CALCIUM 7.9* 8.5* 8.5*  --   PHOS 6.2* 4.6  --   --    CBC Recent Labs  Lab 11/13/18 0421 11/14/18 0448 11/15/18 0508 11/16/18 0703  WBC 6.9 7.6 7.3 6.2  NEUTROABS 4.5 4.9 4.5 3.6  HGB 9.4* 8.8* 9.0* 8.8*  HCT 30.3* 28.5* 29.2* 28.3*  MCV 97.1 96.6 96.1 95.6  PLT 268 308 332 327    @IMGRELPRIORS @ Medications:    . aspirin EC  81 mg Oral Daily  . calcium acetate  667 mg Oral TID WC  . Chlorhexidine Gluconate Cloth  6 each Topical Q0600  . Chlorhexidine Gluconate Cloth  6 each  Topical Q0600  . ciprofloxacin-dexamethasone  4 drop Left EAR BID  . darbepoetin (ARANESP) injection - DIALYSIS  100 mcg Intravenous Q Mon-HD  . docusate sodium  100 mg Oral Q12H  . febuxostat  40 mg Oral Daily  . feeding supplement (NEPRO CARB STEADY)  237 mL Oral Q24H  . gabapentin  300 mg Oral QHS  . heparin  5,000 Units Subcutaneous Q8H  . hydrALAZINE  37.5 mg Oral BID  . insulin aspart  0-5 Units Subcutaneous QHS  . insulin aspart  0-9 Units Subcutaneous TID WC  . mouth rinse  15 mL Mouth Rinse BID  . metoprolol tartrate  25 mg Oral BID  . pantoprazole  40 mg Oral QAC breakfast  . sodium chloride flush  3 mL Intravenous Q12H  . thyroid  120 mg Oral Q0600     Madelon Lips MD [facility] pgr [phone] 11/16/2018, 11:14 AM

## 2018-11-16 NOTE — Progress Notes (Signed)
Progress Note  Patient Name: [name] Date of Encounter: 11/16/2018  Primary Cardiologist: Carlyle Dolly, MD   Subjective   No chest pain or sob.   Inpatient Medications    Scheduled Meds: . aspirin EC  81 mg Oral Daily  . calcium acetate  667 mg Oral TID WC  . Chlorhexidine Gluconate Cloth  6 each Topical Q0600  . Chlorhexidine Gluconate Cloth  6 each Topical Q0600  . ciprofloxacin-dexamethasone  4 drop Left EAR BID  . darbepoetin (ARANESP) injection - DIALYSIS  100 mcg Intravenous Q Mon-HD  . docusate sodium  100 mg Oral Q12H  . febuxostat  40 mg Oral Daily  . feeding supplement (NEPRO CARB STEADY)  237 mL Oral Q24H  . gabapentin  300 mg Oral QHS  . heparin  5,000 Units Subcutaneous Q8H  . hydrALAZINE  37.5 mg Oral BID  . insulin aspart  0-5 Units Subcutaneous QHS  . insulin aspart  0-9 Units Subcutaneous TID WC  . mouth rinse  15 mL Mouth Rinse BID  . metoprolol tartrate  25 mg Oral BID  . pantoprazole  40 mg Oral QAC breakfast  . sodium chloride flush  3 mL Intravenous Q12H  . thyroid  120 mg Oral Q0600   Continuous Infusions: . sodium chloride     PRN Meds: sodium chloride, acetaminophen **OR** acetaminophen, heparin, lidocaine (PF), lidocaine-prilocaine, nitroGLYCERIN, ondansetron **OR** ondansetron (ZOFRAN) IV, pentafluoroprop-tetrafluoroeth, polyethylene glycol, sodium chloride flush   Vital Signs    Vitals:   11/15/18 1959 11/16/18 0307 11/16/18 0439 11/16/18 0958  BP: (!) 164/58  (!) 172/43 (!) 127/51  Pulse: 70  (!) 58 66  Resp:   18   Temp: 98 F (36.7 C)  98.9 F (37.2 C)   TempSrc: Oral  Oral   SpO2: 98%  93%   Weight:  76.3 kg    Height:        Intake/Output Summary (Last 24 hours) at 11/16/2018 1126 Last data filed at 11/16/2018 1005 Gross per 24 hour  Intake 840 ml  Output -  Net 840 ml   Filed Weights   11/14/18 1826 11/15/18 0555 11/16/18 0307  Weight: 76 kg 73.7 kg 76.3 kg    Telemetry    nsr - Personally Reviewed  ECG    none - Personally Reviewed  Physical Exam   GEN: No acute distress.   Neck: No JVD Cardiac: RRR, no murmurs, rubs, or gallops.  Respiratory: Clear to auscultation bilaterally. GI: Soft, nontender, non-distended  MS: No edema; No deformity. Neuro:  Nonfocal  Psych: Normal affect   Labs    Chemistry Recent Labs  Lab 11/10/18 0429 11/12/18 0822 11/13/18 0421 11/14/18 0448  NA 138 138 137  --   K 4.2 3.8 4.3  --   CL 99 100 100  --   CO2 24 27 26   --   GLUCOSE 99 115* 98  --   BUN 64* 48* 22  --   CREATININE 4.43*  4.27* 3.30* 2.36* 4.01*  CALCIUM 7.9* 8.5* 8.5*  --   ALBUMIN 2.6* 2.6*  --   --   GFRNONAA 9*  9* 13* 19* 10*  GFRAA 10*  11* 15* 22* 12*  ANIONGAP 15 11 11   --      Hematology Recent Labs  Lab 11/14/18 0448 11/15/18 0508 11/16/18 0703  WBC 7.6 7.3 6.2  RBC 2.95* 3.04* 2.96*  HGB 8.8* 9.0* 8.8*  HCT 28.5* 29.2* 28.3*  MCV 96.6 96.1 95.6  MCH 29.8 29.6 29.7  MCHC 30.9 30.8 31.1  RDW 14.2 14.5 14.4  PLT 308 332 327    Cardiac EnzymesNo results for input(s): TROPONINI in the last 168 hours. No results for input(s): TROPIPOC in the last 168 hours.   BNPNo results for input(s): BNP, PROBNP in the last 168 hours.   DDimer No results for input(s): DDIMER in the last 168 hours.   Radiology    No results found.  Cardiac Studies   none  Patient Profile     78 y.o. female admitted with chest pain and sob for CABG next week.  Assessment & Plan    1. CAD - she denies anginal symptoms and will undergo CABG next week. 2. ESRD - continue HD 3. HTN - her BP is much improved.     For questions or updates, please contact [place] Please consult [email or web address] for contact info under Cardiology/STEMI.    Signed, Cristopher Peru, MD  11/16/2018, 11:26 AM  Patient ID: [name], female   DOB: Nov 27, 1940, 78 y.o.   MRN: [id number]

## 2018-11-17 ENCOUNTER — Inpatient Hospital Stay (HOSPITAL_COMMUNITY): Payer: Medicare HMO

## 2018-11-17 DIAGNOSIS — I2510 Atherosclerotic heart disease of native coronary artery without angina pectoris: Secondary | ICD-10-CM

## 2018-11-17 DIAGNOSIS — I200 Unstable angina: Secondary | ICD-10-CM

## 2018-11-17 DIAGNOSIS — Z01810 Encounter for preprocedural cardiovascular examination: Secondary | ICD-10-CM

## 2018-11-17 LAB — CBC WITH DIFFERENTIAL/PLATELET
Abs Immature Granulocytes: 0.02 10*3/uL (ref 0.00–0.07)
Basophils Absolute: 0 10*3/uL (ref 0.0–0.1)
Basophils Relative: 1 %
Eosinophils Absolute: 0.4 10*3/uL (ref 0.0–0.5)
Eosinophils Relative: 6 %
HCT: 28.9 % — ABNORMAL LOW (ref 36.0–46.0)
Hemoglobin: 9.1 g/dL — ABNORMAL LOW (ref 12.0–15.0)
Immature Granulocytes: 0 %
Lymphocytes Relative: 22 %
Lymphs Abs: 1.4 10*3/uL (ref 0.7–4.0)
MCH: 30.2 pg (ref 26.0–34.0)
MCHC: 31.5 g/dL (ref 30.0–36.0)
MCV: 96 fL (ref 80.0–100.0)
Monocytes Absolute: 0.7 10*3/uL (ref 0.1–1.0)
Monocytes Relative: 11 %
Neutro Abs: 3.6 10*3/uL (ref 1.7–7.7)
Neutrophils Relative %: 60 %
Platelets: 330 10*3/uL (ref 150–400)
RBC: 3.01 MIL/uL — ABNORMAL LOW (ref 3.87–5.11)
RDW: 14.5 % (ref 11.5–15.5)
WBC: 6.1 10*3/uL (ref 4.0–10.5)
nRBC: 0 % (ref 0.0–0.2)

## 2018-11-17 LAB — RENAL FUNCTION PANEL
Albumin: 2.8 g/dL — ABNORMAL LOW (ref 3.5–5.0)
Anion gap: 14 (ref 5–15)
BUN: 85 mg/dL — ABNORMAL HIGH (ref 8–23)
CO2: 21 mmol/L — ABNORMAL LOW (ref 22–32)
Calcium: 8.5 mg/dL — ABNORMAL LOW (ref 8.9–10.3)
Chloride: 102 mmol/L (ref 98–111)
Creatinine, Ser: 5.88 mg/dL — ABNORMAL HIGH (ref 0.44–1.00)
GFR calc Af Amer: 7 mL/min — ABNORMAL LOW (ref >60)
GFR calc non Af Amer: 6 mL/min — ABNORMAL LOW (ref >60)
Glucose, Bld: 151 mg/dL — ABNORMAL HIGH (ref 70–99)
Phosphorus: 6 mg/dL — ABNORMAL HIGH (ref 2.5–4.6)
Potassium: 4 mmol/L (ref 3.5–5.1)
Sodium: 137 mmol/L (ref 135–145)

## 2018-11-17 LAB — CREATININE, SERUM
Creatinine, Ser: 5.71 mg/dL — ABNORMAL HIGH (ref 0.44–1.00)
GFR calc Af Amer: 8 mL/min — ABNORMAL LOW (ref >60)
GFR calc non Af Amer: 7 mL/min — ABNORMAL LOW (ref >60)

## 2018-11-17 LAB — GLUCOSE, CAPILLARY
Glucose-Capillary: 113 mg/dL — ABNORMAL HIGH (ref 70–99)
Glucose-Capillary: 162 mg/dL — ABNORMAL HIGH (ref 70–99)
Glucose-Capillary: 125 mg/dL — ABNORMAL HIGH (ref 70–99)
Glucose-Capillary: 194 mg/dL — ABNORMAL HIGH (ref 70–99)

## 2018-11-17 MED ORDER — HEPARIN SODIUM (PORCINE) 1000 UNIT/ML DIALYSIS
1000.0000 [IU] | INTRAMUSCULAR | Status: DC | PRN
  Filled 2018-11-17: qty 1

## 2018-11-17 MED ORDER — PENTAFLUOROPROP-TETRAFLUOROETH EX AERO: 1.0000 "application " | INHALATION_SPRAY | CUTANEOUS | Status: DC | PRN

## 2018-11-17 MED ORDER — ALTEPLASE 2 MG IJ SOLR: 2.0000 mg | Freq: Once | INTRAMUSCULAR | Status: DC | PRN

## 2018-11-17 MED ORDER — SODIUM CHLORIDE 0.9 % IV SOLN: 100.0000 mL | INTRAVENOUS | Status: DC | PRN

## 2018-11-17 MED ORDER — LIDOCAINE HCL (PF) 1 % IJ SOLN: 5.0000 mL | INTRAMUSCULAR | Status: DC | PRN

## 2018-11-17 MED ORDER — LIDOCAINE-PRILOCAINE 2.5-2.5 % EX CREA
1.0000 "application " | TOPICAL_CREAM | CUTANEOUS | Status: DC | PRN
  Filled 2018-11-17: qty 5

## 2018-11-17 MED ORDER — DARBEPOETIN ALFA 100 MCG/0.5ML IJ SOSY
PREFILLED_SYRINGE | INTRAMUSCULAR | Status: AC
  Filled 2018-11-17: qty 0.5

## 2018-11-17 NOTE — Progress Notes (Signed)
  [facility] Progress Note   Assessment/ Plan:    1. Severe 3 vessel CAD--> TCTS consulted, appears like plan is to likely remain inpt until CABG.  Plavix washout (last dose 6/17).    2.ESRD: New start dialysis on 6/12.  16 g needles, BFR 300.  CLIP'd to [facility] TTS 11:30.  Next HD today, keep on MWF schedule while inpatient as CABG this Thursday.  3. 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 Nutrition: albumin 2.6, prostat  6. Hypertension: on metop, hydral  7.  Dispo: awaiting CABG  Subjective:    No new c/o's, in good spirits.       Objective:   BP (!) 167/55 (BP Location: Right Arm)   Pulse (!) 59   Temp 98 F (36.7 C) (Oral)   Resp 18   Ht 5\' 4"  (1.626 m)   Wt 77.1 kg Comment: scale c  SpO2 100%   BMI 29.16 kg/m   Physical Exam: Gen: older woman, NAD, sitting in chair CVS: RRR no m/r/g Resp: normal WOB, bibasilar breath sounds Abd: soft nontender NABS Ext: trace LE edema ACCESS: R UE AVF +T/B  Labs: BMET Recent Labs  Lab 11/12/18 0822 11/13/18 0421 11/14/18 0448 11/17/18 0619  NA 138 137  --   --   K 3.8 4.3  --   --   CL 100 100  --   --   CO2 27 26  --   --   GLUCOSE 115* 98  --   --   BUN 48* 22  --   --   CREATININE 3.30* 2.36* 4.01* 5.71*  CALCIUM 8.5* 8.5*  --   --   PHOS 4.6  --   --   --    CBC Recent Labs  Lab 11/14/18 0448 11/15/18 0508 11/16/18 0703 11/17/18 0619  WBC 7.6 7.3 6.2 6.1  NEUTROABS 4.9 4.5 3.6 3.6  HGB 8.8* 9.0* 8.8* 9.1*  HCT 28.5* 29.2* 28.3* 28.9*  MCV 96.6 96.1 95.6 96.0  PLT 308 332 327 330    @IMGRELPRIORS @ Medications:    . aspirin EC  81 mg Oral Daily  . calcium acetate  667 mg Oral TID WC  . Chlorhexidine Gluconate Cloth  6 each Topical Q0600  . ciprofloxacin-dexamethasone  4 drop Left EAR BID  . darbepoetin (ARANESP) injection - DIALYSIS  100 mcg Intravenous Q Mon-HD  . docusate sodium  100 mg Oral Q12H   . febuxostat  40 mg Oral Daily  . feeding supplement (NEPRO CARB STEADY)  237 mL Oral Q24H  . gabapentin  300 mg Oral QHS  . heparin  5,000 Units Subcutaneous Q8H  . hydrALAZINE  37.5 mg Oral BID  . insulin aspart  0-5 Units Subcutaneous QHS  . insulin aspart  0-9 Units Subcutaneous TID WC  . mouth rinse  15 mL Mouth Rinse BID  . metoprolol tartrate  25 mg Oral BID  . pantoprazole  40 mg Oral QAC breakfast  . sodium chloride flush  3 mL Intravenous Q12H  . thyroid  120 mg Oral Q0600

## 2018-11-17 NOTE — Progress Notes (Signed)
Renal Navigator received call from [facility] Admissions requesting an update on patient. Update provided. Renal Navigator will continue to follow for discharge date and will notify [place] and OP HD clinic/[name] of patient's start date.  Alphonzo Cruise, [place] Renal Navigator [phone]

## 2018-11-17 NOTE — Progress Notes (Signed)
PROGRESS NOTE   [name]  IWL:[id number]    DOB: 1940-07-22    DOA: 11/06/2018  PCP: Doree Albee, MD   I have briefly reviewed patients previous medical records in [facility].  Brief Narrative:  78 year old female with PMH of DM 2, HTN, CKD 5, CAD s/p STEMI treated medically due to advanced CKD and anemia, gout, chronic combined CHF, hypothyroid, presented to ED due to chest pain and dyspnea.  She was diagnosed with NSTEMI, acute on chronic diastolic CHF, progressive CKD to ESRD.  Nephrology consulted and HD initiated this admission.  Cardiology consulted, cardiac cath 6/17 showed severe three-vessel disease, TCTS consulted and plan CABG after Plavix washout (last dose 11/12/2018).  Patient reportedly for CABG on 11/20/2018.  Stable.   Assessment & Plan:   Principal Problem:   Non-ST elevation (NSTEMI) myocardial infarction [facility]) Active Problems:   HTN (hypertension)   Hyperlipidemia associated with type 2 diabetes mellitus ([place])   Essential hypertension, benign   Hypothyroidism   ESRD on dialysis [facility])   NSTEMI/CAD  Cardiology continues to follow.  Multiple prior admissions with NSTEMI in the setting of severe anemia.  She was treated medically in the past including DAPT and intervention was avoided primarily due to advanced CKD and high risk for pushing her into ESRD requiring HD and due to anemia.  DAPT had to be stopped in 10/2016 due to hemoglobin of 4.4.  Recurrent NSTEMI this admission with associated decompensated CHF and progressive CKD.  Troponin peaked to 4.14.  Continue aspirin, Plavix, metoprolol >to be switched to Toprol at discharge.  Intolerant to statins.  TTE: LVEF 55-60% and grade 1 diastolic dysfunction.  Clinically euvolemic after multiple HD this admission.  Chest pain resolved without recurrence.  Cardiac cath 6/17 showed severe triple-vessel CAD with chronic occlusion of the distal RCA and thoracic surgery consulted.  TCTS plans CABG  after Plavix washout (last dose 11/12/2018).    Patient to remain in the hospital until surgery this week given need for HD and current COVID-19 pandemic.  As per patient's report, surgery reportedly on 11/20/2018.  New ESRD  Nephrology continues to follow for HD needs.  It was felt that Bactrim that was started for foot ulcer probably worsened her chronic kidney disease to ESRD.  Newly started HD this admission.  Has had multiple dialysis thus far this admission.  Due to have dialysis today 6/22.  Has been set up for OP HD on TTS schedule when discharged.  Hyperkalemia, metabolic acidosis and asterixis have resolved.  Clinically appears euvolemic at this time.  Stable.    Acute on chronic diastolic CHF  TTE results as above  Volume managed by serial HD as noted above.  Compensated  Acute respiratory failure with hypoxia  Secondary to decompensated CHF.  Resolved.  Type II DM with renal complications  Reasonable inpatient control on SSI.  A1c 5.2 suggest good control.  Continue gabapentin for peripheral neuropathy.  Essential hypertension  Controlled on hydralazine 37.5 mg twice daily and metoprolol increased to 25 mg twice daily.  Controlled.  Hypothyroid  Clinically euthyroid.  Continue supplements.  TSH 0.326.  Consider repeating TSH in 4 weeks.  GERD  Continue PPI.  Anemia of ESRD  Prior history of GI bleed  S/p Aranesp on 6/15.  Hemoglobin stable in the high 8-9 range.  Periodically follow CBCs across HD.  Will need to monitor closely for GI bleed and/or worsening anemia while on dual antiplatelets.  Secondary hyperparathyroidism  Continue PhosLo.  Hyperlipidemia  Intolerant of statins.  May need to consider PCSK9 inhibitor as outpatient.  Gout  No acute flare.   DVT prophylaxis: Subcutaneous heparin Code Status: Full Family Communication: I discussed in detail with patient's son on 6/19, updated care and answered questions.  Disposition: To be determined pending CABG this week.   Consultants:  Nephrology Cardiology TCTS  Procedures:  HD Cardiac cath 11/12/2018:   Prox RCA lesion is 50% stenosed.  Mid RCA to Dist RCA lesion is 80% stenosed.  Dist RCA lesion is 100% stenosed.  Prox Cx lesion is 99% stenosed.  Mid Cx lesion is 99% stenosed.  Dist Cx lesion is 90% stenosed.  1st Mrg lesion is 90% stenosed.  Ost LAD to Prox LAD lesion is 70% stenosed.  Mid LAD lesion is 30% stenosed.   1. 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 Hold Plavix.    Antimicrobials:  None   Subjective: "I was hungry and ate all my breakfast".  States that she is waiting for HD this morning.  Denies complaints.  No chest pain or dyspnea.  Wished her ablated happy birthday  ROS: As above, otherwise negative  Objective:  Vitals:   11/16/18 0958 11/16/18 1156 11/16/18 2136 11/17/18 0432  BP: (!) 127/51 (!) 143/51 (!) 152/52 (!) 162/52  Pulse: 66 (!) 59 64 60  Resp:  20 18 18   Temp:  98.2 F (36.8 C) 98.6 F (37 C) 98.3 F (36.8 C)  TempSrc:  Oral Oral Oral  SpO2:  97% 99% 93%  Weight:    77.1 kg  Height:        Examination:  General exam: Pleasant elderly female, moderately built and nourished sitting up comfortably in chair this morning. Respiratory system: Clear to auscultation.  No increased work of breathing. Cardiovascular system: S1 and S2 heard, RRR.  No JVD, murmurs or pedal edema.  Telemetry personally reviewed: Mostly sinus rhythm very occasionally mild sinus bradycardia in the high 50s. Gastrointestinal system: Abdomen is nondistended, soft and nontender. No organomegaly or masses felt. Normal bowel sounds heard.  Stable. Central nervous system: Alert and oriented. No focal neurological deficits. Extremities: Symmetric 5 x 5 power.   RUE AVF +. Skin: No rashes, lesions or ulcers Psychiatry: Judgement and insight appear normal. Mood & affect: Appears to be in good spirits.     Data Reviewed: I have personally reviewed following labs and imaging studies  CBC: Recent Labs  Lab 11/13/18 0421 11/14/18 0448 11/15/18 0508 11/16/18 0703 11/17/18 0619  WBC 6.9 7.6 7.3 6.2 6.1  NEUTROABS 4.5 4.9 4.5 3.6 3.6  HGB 9.4* 8.8* 9.0* 8.8* 9.1*  HCT 30.3* 28.5* 29.2* 28.3* 28.9*  MCV 97.1 96.6 96.1 95.6 96.0  PLT 268 308 332 327 295   Basic Metabolic Panel: Recent Labs  Lab 11/12/18 0822 11/13/18 0421 11/14/18 0448 11/17/18 0619  NA 138 137  --   --   K 3.8 4.3  --   --   CL 100 100  --   --   CO2 27 26  --   --   GLUCOSE 115* 98  --   --   BUN 48* 22  --   --   CREATININE 3.30* 2.36* 4.01* 5.71*  CALCIUM 8.5* 8.5*  --   --   PHOS 4.6  --   --   --  Liver Function Tests: Recent Labs  Lab 11/12/18 0822  ALBUMIN 2.6*   Coagulation Profile: Recent Labs  Lab 11/12/18 0847  INR 1.1   Cardiac Enzymes: No results for input(s): CKTOTAL, CKMB, CKMBINDEX, TROPONINI in the last 168 hours. CBG: Recent Labs  Lab 11/16/18 0600 11/16/18 1153 11/16/18 1557 11/16/18 2203 11/17/18 0613  GLUCAP 108* 133* 134* 113* 113*    No results found for this or any previous visit (from the past 240 hour(s)).       Radiology Studies: No results found.      Scheduled Meds: . aspirin EC  81 mg Oral Daily  . calcium acetate  667 mg Oral TID WC  . Chlorhexidine Gluconate Cloth  6 each Topical Q0600  . Chlorhexidine Gluconate Cloth  6 each Topical Q0600  . ciprofloxacin-dexamethasone  4 drop Left EAR BID  . darbepoetin (ARANESP) injection - DIALYSIS  100 mcg Intravenous Q Mon-HD  . docusate sodium  100 mg Oral Q12H  . febuxostat  40 mg Oral Daily  . feeding supplement (NEPRO CARB STEADY)  237 mL Oral Q24H  . gabapentin  300 mg Oral QHS  . heparin  5,000 Units Subcutaneous Q8H  . hydrALAZINE  37.5 mg Oral BID   . insulin aspart  0-5 Units Subcutaneous QHS  . insulin aspart  0-9 Units Subcutaneous TID WC  . mouth rinse  15 mL Mouth Rinse BID  . metoprolol tartrate  25 mg Oral BID  . pantoprazole  40 mg Oral QAC breakfast  . sodium chloride flush  3 mL Intravenous Q12H  . thyroid  120 mg Oral Q0600   Continuous Infusions: . sodium chloride       LOS: 10 days     Vernell Leep, MD, FACP, [facility]. [facility]  To contact the attending provider between 7A-7P or the covering provider during after hours 7P-7A, please log into the web site [email or web address] and access using universal [place] password for that web site. If you do not have the password, please call the hospital operator.  11/17/2018, 8:47 AM

## 2018-11-17 NOTE — Progress Notes (Signed)
1421 Came to see pt to walk but gone to HD.Marland Kitchen will continue to follow. Graylon Good RN BSN 11/17/2018 2:22 PM

## 2018-11-17 NOTE — Care Management Important Message (Signed)
Important Message  Patient Details  Name: IOANA LOUKS MRN: [id number] Date of Birth: [birth year]   Medicare Important Message Given:  Yes     [name] 11/17/2018, 12:49 PM

## 2018-11-17 NOTE — Progress Notes (Addendum)
Progress Note  Patient Name: AAYLA MARROCCO Date of Encounter: 11/17/2018  Primary Cardiologist: Carlyle Dolly, MD   Subjective   Feeling well. No chest pain, sob or palpitations. CABG this week.   Inpatient Medications    Scheduled Meds: . aspirin EC  81 mg Oral Daily  . calcium acetate  667 mg Oral TID WC  . Chlorhexidine Gluconate Cloth  6 each Topical Q0600  . Chlorhexidine Gluconate Cloth  6 each Topical Q0600  . ciprofloxacin-dexamethasone  4 drop Left EAR BID  . darbepoetin (ARANESP) injection - DIALYSIS  100 mcg Intravenous Q Mon-HD  . docusate sodium  100 mg Oral Q12H  . febuxostat  40 mg Oral Daily  . feeding supplement (NEPRO CARB STEADY)  237 mL Oral Q24H  . gabapentin  300 mg Oral QHS  . heparin  5,000 Units Subcutaneous Q8H  . hydrALAZINE  37.5 mg Oral BID  . insulin aspart  0-5 Units Subcutaneous QHS  . insulin aspart  0-9 Units Subcutaneous TID WC  . mouth rinse  15 mL Mouth Rinse BID  . metoprolol tartrate  25 mg Oral BID  . pantoprazole  40 mg Oral QAC breakfast  . sodium chloride flush  3 mL Intravenous Q12H  . thyroid  120 mg Oral Q0600   Continuous Infusions: . sodium chloride     PRN Meds: sodium chloride, acetaminophen **OR** acetaminophen, heparin, lidocaine (PF), lidocaine-prilocaine, nitroGLYCERIN, ondansetron **OR** ondansetron (ZOFRAN) IV, pentafluoroprop-tetrafluoroeth, polyethylene glycol, sodium chloride flush   Vital Signs    Vitals:   11/16/18 0958 11/16/18 1156 11/16/18 2136 11/17/18 0432  BP: (!) 127/51 (!) 143/51 (!) 152/52 (!) 162/52  Pulse: 66 (!) 59 64 60  Resp:  20 18 18   Temp:  98.2 F (36.8 C) 98.6 F (37 C) 98.3 F (36.8 C)  TempSrc:  Oral Oral Oral  SpO2:  97% 99% 93%  Weight:    77.1 kg  Height:        Intake/Output Summary (Last 24 hours) at 11/17/2018 0722 Last data filed at 11/17/2018 0300 Gross per 24 hour  Intake 1083 ml  Output 2 ml  Net 1081 ml   Last 3 Weights 11/17/2018 11/16/2018 11/15/2018   Weight (lbs) 169 lb 14.4 oz 168 lb 3.2 oz 162 lb 8 oz  Weight (kg) 77.066 kg 76.295 kg 73.71 kg      Telemetry    NSR at rate of 60-70s - Personally Reviewed  ECG    N/A  Physical Exam   GEN: No acute distress.   Neck: No JVD Cardiac: RRR, no murmurs, rubs, or gallops.  Respiratory: Clear to auscultation bilaterally. GI: Soft, nontender, non-distended  MS: No edema; No deformity. Neuro:  Nonfocal  Psych: Normal affect   Labs    Chemistry Recent Labs  Lab 11/12/18 0822 11/13/18 0421 11/14/18 0448 11/17/18 0619  NA 138 137  --   --   K 3.8 4.3  --   --   CL 100 100  --   --   CO2 27 26  --   --   GLUCOSE 115* 98  --   --   BUN 48* 22  --   --   CREATININE 3.30* 2.36* 4.01* 5.71*  CALCIUM 8.5* 8.5*  --   --   ALBUMIN 2.6*  --   --   --   GFRNONAA 13* 19* 10* 7*  GFRAA 15* 22* 12* 8*  ANIONGAP 11 11  --   --  Hematology Recent Labs  Lab 11/15/18 0508 11/16/18 0703 11/17/18 0619  WBC 7.3 6.2 6.1  RBC 3.04* 2.96* 3.01*  HGB 9.0* 8.8* 9.1*  HCT 29.2* 28.3* 28.9*  MCV 96.1 95.6 96.0  MCH 29.6 29.7 30.2  MCHC 30.8 31.1 31.5  RDW 14.5 14.4 14.5  PLT 332 327 330    Radiology    No results found.  Cardiac Studies   Echocardiogram 11/10/2018: Impressions: 1. The left ventricle has normal systolic function, with an ejection fraction of 55-60%. The cavity size was normal. There is moderately increased left ventricular wall thickness. Left ventricular diastolic Doppler parameters are consistent with  impaired relaxation. Elevated mean left atrial pressure. 2. The right ventricle has normal systolic function. The cavity was normal. There is no increase in right ventricular wall thickness. 3. Left atrial size was moderately dilated. 4. Right atrial size was mildly dilated. 5. The aortic valve is tricuspid. Moderate thickening of the aortic valve. Moderate calcification of the aortic valve. No stenosis of the aortic valve. Moderate aortic annular  calcification noted. 6. The mitral valve is abnormal. Moderate thickening of the mitral valve leaflet. Moderate calcification of the mitral valve leaflet. There is moderate mitral annular calcification present. No evidence of mitral valve stenosis. 7. The aortic root is normal in size and structure. 8. Pulmonary hypertension is mildly elevated, PASP is 34 mmHg. _______________  Left Heart Catheterization 11/12/2018:  Prox RCA lesion is 50% stenosed.  Mid RCA to Dist RCA lesion is 80% stenosed.  Dist RCA lesion is 100% stenosed.  Prox Cx lesion is 99% stenosed.  Mid Cx lesion is 99% stenosed.  Dist Cx lesion is 90% stenosed.  1st Mrg lesion is 90% stenosed.  Ost LAD to Prox LAD lesion is 70% stenosed.  Mid LAD lesion is 30% stenosed.  1. 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 Hold Plavix.   Patient Profile     Ms. Barnard is a 78 y.o. female with a history of NSTEMI treated medically due to renal disease and anemia (previously on dual antiplatelet therapy but had to be stopped due to anemia), chronic combined CHF with EF of 60-65% on 09/2017, hypertension, progressive CKD stage IV nearing dialysis, GI bleeds requiring transfusion, hypothyroidism, and gout who was admitted to the [facility] on 11/06/2018 for an NSTEMI after presenting with chest pain that woke he up from sleep. Troponin peaked at 4.14. Patient was started on hemodialysis due to uremia. Patient transferred to [facility] on 11/11/2018 for cardiac catheterization now that hemodialysis established.  Assessment & Plan    1. NSTEMI/CAD - Troponin peaked at 4.14. Left heart catheterization on 6/17 showed severe triple vessel CAD with chronic occlusion of the distal RCA. Plan for CABG 6/25 after Plavix washout (last dose 6/17).  - Decision has been made to  keep patient in hospital until surgery given need for dialysis and current COVID-19 pandemic. - Continue ASA and lopressor (change to Toprol at discharge).  - Allergic to statin. Consider PCSK9 inhibitor as outpatient.   2. Chronic combined CHF - Echo this admission showed showed LVEF of 55-60% with grade 1 diastolic dysfunction. - Volume being managed with hemodialysis. - Continue BB and hydralazine.   3. ESRD  - Now on HD  4. HTN - Elevated. Consider up titration of medications.   5. DM - Per  primary team       For questions or updates, please contact [place] Please consult [email or web address] for contact info under        Signed, Leanor Kail, PA  11/17/2018, 7:22 AM    Personally seen and examined. Agree with above.   78 year old with non-STEMI EF 60%, troponin peak 4.1, new start hemodialysis transferred here for cardiac catheterization which demonstrated severe triple-vessel disease chronic occlusion of distal RCA.  Plan is for coronary artery bypass on 6/25 after Plavix washout.  Last dose was on 6/17.  GEN: Well nourished, well developed, in no acute distress  HEENT: normal  Neck: no JVD, carotid bruits, or masses Cardiac: RRR; no murmurs, rubs, or gallops,no edema  Respiratory:  clear to auscultation bilaterally, normal work of breathing GI: soft, nontender, nondistended, + BS MS: no deformity or atrophy  Skin: warm and dry, no rash Neuro:  Alert and Oriented x 3, Strength and sensation are intact Psych: euthymic mood, full affect  Non-STEMI CAD Diastolic heart failure End-stage renal disease on hemodialysis Essential hypertension DM  - Waiting for CABG.  Continue with current medical care.  Candee Furbish, MD

## 2018-11-17 NOTE — Progress Notes (Signed)
5 Days Post-Op Procedure(s) (LRB): LEFT HEART CATH AND CORONARY ANGIOGRAPHY (N/A) Subjective: Denies CP or SOB, a little anxious about surgery  Objective: Vital signs in last 24 hours: Temp:  [98 F (36.7 C)-98.6 F (37 C)] 98 F (36.7 C) (06/22 1415) Pulse Rate:  [59-67] 66 (06/22 1430) Cardiac Rhythm: Normal sinus rhythm (06/22 1420) Resp:  [17-19] 19 (06/22 1430) BP: (134-170)/(50-63) 160/58 (06/22 1420) SpO2:  [93 %-100 %] 100 % (06/22 1430) Weight:  [77.1 kg] 77.1 kg (06/22 1415)  Hemodynamic parameters for last 24 hours:    Intake/Output from previous day: 06/21 0701 - 06/22 0700 In: 1083 [P.O.:1080; I.V.:3] Out: 2 [Urine:2] Intake/Output this shift: Total I/O In: 170 [P.O.:170] Out: -   General appearance: alert, cooperative and no distress Neurologic: intact Heart: regular rate and rhythm Lungs: clear to auscultation bilaterally  Lab Results: Recent Labs    11/16/18 0703 11/17/18 0619  WBC 6.2 6.1  HGB 8.8* 9.1*  HCT 28.3* 28.9*  PLT 327 330   BMET:  Recent Labs    11/17/18 0619 11/17/18 1336  NA  --  137  K  --  4.0  CL  --  102  CO2  --  21*  GLUCOSE  --  151*  BUN  --  85*  CREATININE 5.71* 5.88*  CALCIUM  --  8.5*    PT/INR: No results for input(s): LABPROT, INR in the last 72 hours. ABG No results found for: PHART, HCO3, TCO2, ACIDBASEDEF, O2SAT CBG (last 3)  Recent Labs    11/16/18 2203 11/17/18 0613 11/17/18 1104  GLUCAP 113* 113* 162*    Assessment/Plan: S/P Procedure(s) (LRB): LEFT HEART CATH AND CORONARY ANGIOGRAPHY (N/A) - On HD currently, tolerating well with no issues For CABG on Thursday 6/25 after plavix washout complete   LOS: 10 days    Melrose Nakayama 11/17/2018

## 2018-11-18 LAB — CBC WITH DIFFERENTIAL/PLATELET
Abs Immature Granulocytes: 0.03 10*3/uL (ref 0.00–0.07)
Basophils Absolute: 0.1 10*3/uL (ref 0.0–0.1)
Basophils Relative: 1 %
Eosinophils Absolute: 0.3 10*3/uL (ref 0.0–0.5)
Eosinophils Relative: 4 %
HCT: 28.9 % — ABNORMAL LOW (ref 36.0–46.0)
Hemoglobin: 9.2 g/dL — ABNORMAL LOW (ref 12.0–15.0)
Immature Granulocytes: 0 %
Lymphocytes Relative: 19 %
Lymphs Abs: 1.4 10*3/uL (ref 0.7–4.0)
MCH: 30.1 pg (ref 26.0–34.0)
MCHC: 31.8 g/dL (ref 30.0–36.0)
MCV: 94.4 fL (ref 80.0–100.0)
Monocytes Absolute: 0.8 10*3/uL (ref 0.1–1.0)
Monocytes Relative: 11 %
Neutro Abs: 4.9 10*3/uL (ref 1.7–7.7)
Neutrophils Relative %: 65 %
Platelets: 332 10*3/uL (ref 150–400)
RBC: 3.06 MIL/uL — ABNORMAL LOW (ref 3.87–5.11)
RDW: 14.4 % (ref 11.5–15.5)
WBC: 7.5 10*3/uL (ref 4.0–10.5)
nRBC: 0 % (ref 0.0–0.2)

## 2018-11-18 LAB — GLUCOSE, CAPILLARY
Glucose-Capillary: 119 mg/dL — ABNORMAL HIGH (ref 70–99)
Glucose-Capillary: 142 mg/dL — ABNORMAL HIGH (ref 70–99)
Glucose-Capillary: 120 mg/dL — ABNORMAL HIGH (ref 70–99)
Glucose-Capillary: 143 mg/dL — ABNORMAL HIGH (ref 70–99)

## 2018-11-18 MED ORDER — CHLORHEXIDINE GLUCONATE CLOTH 2 % EX PADS
6.0000 | MEDICATED_PAD | Freq: Every day | CUTANEOUS | Status: DC
  Administered 2018-11-20: 6 via TOPICAL

## 2018-11-18 NOTE — Plan of Care (Signed)
  Problem: Clinical Measurements: Goal: Will remain free from infection Outcome: Progressing   Problem: Activity: Goal: Risk for activity intolerance will decrease Outcome: Progressing   Problem: Coping: Goal: Level of anxiety will decrease Outcome: Progressing   

## 2018-11-18 NOTE — Progress Notes (Addendum)
Progress Note  Patient Name: [name] Date of Encounter: 11/18/2018  Primary Cardiologist: Carlyle Dolly, MD   Subjective   Hypotensive episode during dialysis yesterday. No chest pain or dyspnea. Slept well.   Inpatient Medications    Scheduled Meds: . aspirin EC  81 mg Oral Daily  . calcium acetate  667 mg Oral TID WC  . Chlorhexidine Gluconate Cloth  6 each Topical Q0600  . ciprofloxacin-dexamethasone  4 drop Left EAR BID  . darbepoetin (ARANESP) injection - DIALYSIS  100 mcg Intravenous Q Mon-HD  . docusate sodium  100 mg Oral Q12H  . febuxostat  40 mg Oral Daily  . feeding supplement (NEPRO CARB STEADY)  237 mL Oral Q24H  . gabapentin  300 mg Oral QHS  . heparin  5,000 Units Subcutaneous Q8H  . hydrALAZINE  37.5 mg Oral BID  . insulin aspart  0-5 Units Subcutaneous QHS  . insulin aspart  0-9 Units Subcutaneous TID WC  . mouth rinse  15 mL Mouth Rinse BID  . metoprolol tartrate  25 mg Oral BID  . pantoprazole  40 mg Oral QAC breakfast  . sodium chloride flush  3 mL Intravenous Q12H  . thyroid  120 mg Oral Q0600   Continuous Infusions: . sodium chloride     PRN Meds: sodium chloride, acetaminophen **OR** acetaminophen, heparin, nitroGLYCERIN, ondansetron **OR** ondansetron (ZOFRAN) IV, polyethylene glycol, sodium chloride flush   Vital Signs    Vitals:   11/17/18 1834 11/17/18 1922 11/17/18 2353 11/18/18 0429  BP: 124/67 (!) 164/52 (!) 169/51 (!) 124/55  Pulse: 67 71 67 67  Resp: 18 18  18   Temp:  98.3 F (36.8 C)  99.2 F (37.3 C)  TempSrc:  Oral  Oral  SpO2: 100% 100%  97%  Weight:    74.2 kg  Height:        Intake/Output Summary (Last 24 hours) at 11/18/2018 0813 Last data filed at 11/18/2018 0100 Gross per 24 hour  Intake 290 ml  Output 2364 ml  Net -2074 ml   Last 3 Weights 11/18/2018 11/17/2018 11/17/2018  Weight (lbs) 163 lb 9.3 oz 164 lb 14.5 oz 169 lb 15.6 oz  Weight (kg) 74.2 kg 74.8 kg 77.1 kg      Telemetry    NSR at  controlled rate - Personally Reviewed  ECG   No new tracing   Physical Exam   GEN: No acute distress.   Neck: No JVD Cardiac: RRR, no murmurs, rubs, or gallops.  Respiratory: Clear to auscultation bilaterally. GI: Soft, nontender, non-distended  MS: No edema; No deformity. Neuro:  Nonfocal  Psych: Normal affect   Labs    Chemistry Recent Labs  Lab 11/12/18 0822 11/13/18 0421 11/14/18 0448 11/17/18 0619 11/17/18 1336  NA 138 137  --   --  137  K 3.8 4.3  --   --  4.0  CL 100 100  --   --  102  CO2 27 26  --   --  21*  GLUCOSE 115* 98  --   --  151*  BUN 48* 22  --   --  85*  CREATININE 3.30* 2.36* 4.01* 5.71* 5.88*  CALCIUM 8.5* 8.5*  --   --  8.5*  ALBUMIN 2.6*  --   --   --  2.8*  GFRNONAA 13* 19* 10* 7* 6*  GFRAA 15* 22* 12* 8* 7*  ANIONGAP 11 11  --   --  14  Hematology Recent Labs  Lab 11/16/18 0703 11/17/18 0619 11/18/18 0446  WBC 6.2 6.1 7.5  RBC 2.96* 3.01* 3.06*  HGB 8.8* 9.1* 9.2*  HCT 28.3* 28.9* 28.9*  MCV 95.6 96.0 94.4  MCH 29.7 30.2 30.1  MCHC 31.1 31.5 31.8  RDW 14.4 14.5 14.4  PLT 327 330 332    Radiology    Vas US Doppler Pre Cabg  Result Date: 11/17/2018 PREOPERATIVE VASCULAR EVALUATION  Risk Factors:     Hypertension, hyperlipidemia, Diabetes, coronary artery                   disease. Comparison Study: Prior study from 04/12/18 is available for comparison. No                   significant change noted. Performing Technologist: Sharion Dove RVS  Examination Guidelines: A complete evaluation includes B-mode imaging, spectral Doppler, color Doppler, and power Doppler as needed of all accessible portions of each vessel. Bilateral testing is considered an integral part of a complete examination. Limited examinations for reoccurring indications may be performed as noted.  Right Carotid Findings: +----------+--------+--------+--------+----------------------+--------+           PSV cm/sEDV cm/sStenosisDescribe               Comments +----------+--------+--------+--------+----------------------+--------+ CCA Prox  81      19                                             +----------+--------+--------+--------+----------------------+--------+ CCA Distal78      17                                             +----------+--------+--------+--------+----------------------+--------+ ICA Prox  135     34              calcific and irregular         +----------+--------+--------+--------+----------------------+--------+ ICA Distal133     23                                             +----------+--------+--------+--------+----------------------+--------+ ECA       315     9                                              +----------+--------+--------+--------+----------------------+--------+ Portions of this table do not appear on this page. +----------+--------+-------+--------+------------+           PSV cm/sEDV cmsDescribeArm Pressure +----------+--------+-------+--------+------------+ Subclavian151                                 +----------+--------+-------+--------+------------+ +---------+--------+--+--------+-+ VertebralPSV cm/s37EDV cm/s7 +---------+--------+--+--------+-+ Left Carotid Findings: +----------+--------+--------+--------+------------+--------+           PSV cm/sEDV cm/sStenosisDescribe    Comments +----------+--------+--------+--------+------------+--------+ CCA Prox  87      11                                   +----------+--------+--------+--------+------------+--------+  CCA Distal113     15                                   +----------+--------+--------+--------+------------+--------+ ICA Prox  120     19              heterogenous         +----------+--------+--------+--------+------------+--------+ ICA Distal112     20                                   +----------+--------+--------+--------+------------+--------+ ECA       246     10                                    +----------+--------+--------+--------+------------+--------+ +----------+--------+--------+--------+------------+ SubclavianPSV cm/sEDV cm/sDescribeArm Pressure +----------+--------+--------+--------+------------+           81                      155          +----------+--------+--------+--------+------------+ +---------+--------+---+--------+--+ VertebralPSV cm/s106EDV cm/s13 +---------+--------+---+--------+--+  ABI Findings: +--------+------------------+-----+--------+-------------------+ Right   Rt Pressure (mmHg)IndexWaveformComment             +--------+------------------+-----+--------+-------------------+ Brachial                               restricted/dialysis +--------+------------------+-----+--------+-------------------+ PTA     163               1.05 biphasic                    +--------+------------------+-----+--------+-------------------+ DP      130               0.84 biphasic                    +--------+------------------+-----+--------+-------------------+ +--------+------------------+-----+---------+-------+ Left    Lt Pressure (mmHg)IndexWaveform Comment +--------+------------------+-----+---------+-------+ [id number]                    triphasic        +--------+------------------+-----+---------+-------+ PTA     158               1.02 biphasic         +--------+------------------+-----+---------+-------+ DP      136               0.88 biphasic         +--------+------------------+-----+---------+-------+ +-------+---------------+----------------+ ABI/TBIToday's ABI/TBIPrevious ABI/TBI +-------+---------------+----------------+ Right  1.05                            +-------+---------------+----------------+ Left   1.02                            +-------+---------------+----------------+  Right Doppler Findings:  +-----------+--------+-----+--------+------------------------------------------+ Site       PressureIndexDoppler Comments                                   +-----------+--------+-----+--------+------------------------------------------+ Brachial  restricted/dialysis                        +-----------+--------+-----+--------+------------------------------------------+ Radial                  biphasic                                           +-----------+--------+-----+--------+------------------------------------------+ Ulnar                   biphasic                                           +-----------+--------+-----+--------+------------------------------------------+ Palmar Arch                     Doppler signal reverses with radial                                        compression and remains normal with ulnar                                  compression                                +-----------+--------+-----+--------+------------------------------------------+  Left Doppler Findings: +-----------+--------+-----+---------+--------+ Site       PressureIndexDoppler  Comments +-----------+--------+-----+---------+--------+ Brachial   155          triphasic         +-----------+--------+-----+---------+--------+ Radial                  triphasic         +-----------+--------+-----+---------+--------+ Ulnar                   triphasic         +-----------+--------+-----+---------+--------+ Palmar Arch                      WNL      +-----------+--------+-----+---------+--------+  Summary: Right Carotid: Velocities in the right ICA are consistent with a 1-39% stenosis. Left Carotid: Velocities in the left ICA are consistent with a 1-39% stenosis. Vertebrals:  Bilateral vertebral arteries demonstrate antegrade flow. Subclavians: Normal flow hemodynamics were seen in bilateral subclavian              arteries. Right  ABI: Resting right ankle-brachial index is within normal range. No evidence of significant right lower extremity arterial disease. Left ABI: Resting left ankle-brachial index is within normal range. No evidence of significant left lower extremity arterial disease. Right Upper Extremity: Doppler waveforms remain within normal limits with right radial compression. Doppler waveforms remain within normal limits with right ulnar compression. Left Upper Extremity: Doppler waveforms remain within normal limits with left radial compression. Doppler waveforms remain within normal limits with left ulnar compression.    Preliminary     Cardiac Studies   Echocardiogram 11/10/2018: Impressions: 1. The left ventricle has normal systolic function, with an ejection fraction of 55-60%. The cavity size was normal. There is moderately increased left ventricular wall thickness.  Left ventricular diastolic Doppler parameters are consistent with  impaired relaxation. Elevated mean left atrial pressure. 2. The right ventricle has normal systolic function. The cavity was normal. There is no increase in right ventricular wall thickness. 3. Left atrial size was moderately dilated. 4. Right atrial size was mildly dilated. 5. The aortic valve is tricuspid. Moderate thickening of the aortic valve. Moderate calcification of the aortic valve. No stenosis of the aortic valve. Moderate aortic annular calcification noted. 6. The mitral valve is abnormal. Moderate thickening of the mitral valve leaflet. Moderate calcification of the mitral valve leaflet. There is moderate mitral annular calcification present. No evidence of mitral valve stenosis. 7. The aortic root is normal in size and structure. 8. Pulmonary hypertension is mildly elevated, PASP is 34 mmHg. _______________  Left Heart Catheterization 11/12/2018:  Prox RCA lesion is 50% stenosed.  Mid RCA to Dist RCA lesion is 80% stenosed.  Dist RCA lesion is 100%  stenosed.  Prox Cx lesion is 99% stenosed.  Mid Cx lesion is 99% stenosed.  Dist Cx lesion is 90% stenosed.  1st Mrg lesion is 90% stenosed.  Ost LAD to Prox LAD lesion is 70% stenosed.  Mid LAD lesion is 30% stenosed.  1. 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 Hold Plavix.   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 Troponin peaked at 4.14. Patient was started on hemodialysis due to uremia. Patient transferred to [facility] on 11/11/2018 for cardiac catheterization now that hemodialysis established.  Assessment & Plan    1. NSTEMI/CAD - Troponin peaked at 4.14. Left heart catheterization on 6/17 showed severe triple vessel CAD with chronic occlusion of the distal RCA. Plan for CABG 6/25 after Plavix washout (last dose 6/17).  - Continue ASA and lopressor (change to Toprol at discharge).  - Allergic to statin. Consider PCSK9 inhibitor as outpatient.   2. Chronic combined CHF - Echo this admission showed showed LVEF of 55-60% with grade 1 diastolic dysfunction. - Volume being managed with hemodialysis. - Continue BB and hydralazine.   3. ESRD  - Now on HD  4. HTN - Stable on current medications.   For questions or updates, please contact [place] Please consult [email or web address] for contact info under        Signed, Leanor Kail, PA  11/18/2018, 8:13 AM     Personally seen and examined. Agree with above.  GEN: Well nourished, well developed, in no acute distress  HEENT: normal  Neck: no JVD, carotid bruits, or masses Cardiac: RRR; no murmurs, rubs, or gallops,no edema  Respiratory:  clear to auscultation bilaterally, normal work of breathing GI: soft, nontender, nondistended, + BS MS: no deformity or atrophy  Skin: warm and dry, no rash Neuro:  Alert and Oriented x 3, Strength and sensation are intact Psych: euthymic mood, full affect  Hypotension HD NSTEMI - no CP CAD - CABG 6/25. Plavix washout ESRD -  HD HL with statin intolerance "tried them all"- PCSK9 clinic as outpatient.   Candee Furbish, MD

## 2018-11-18 NOTE — Plan of Care (Signed)
  Problem: Clinical Measurements: Goal: Will remain free from infection Outcome: Progressing   Problem: Activity: Goal: Risk for activity intolerance will decrease Outcome: Progressing   Problem: Safety: Goal: Ability to remain free from injury will improve Outcome: Progressing   Problem: Safety: Goal: Ability to remain free from injury will improve Outcome: Progressing

## 2018-11-18 NOTE — Progress Notes (Signed)
PROGRESS NOTE   VASHON ARCH  QBH:[id number]    DOB: 1940/07/09    DOA: 11/06/2018  PCP: Doree Albee, MD   I have briefly reviewed patients previous medical records in [facility].  Brief Narrative:  79 year old female with PMH of DM 2, HTN, CKD 5, CAD s/p STEMI treated medically due to advanced CKD and anemia, gout, chronic combined CHF, hypothyroid, presented to ED due to chest pain and dyspnea.  She was diagnosed with NSTEMI, acute on chronic diastolic CHF, progressive CKD to ESRD.  Nephrology consulted and HD initiated this admission.  Cardiology consulted, cardiac cath 6/17 showed severe three-vessel disease, TCTS consulted and plan CABG after Plavix washout (last dose 11/12/2018).  Patient for CABG on 11/20/2018.  Stable.   Assessment & Plan:   Principal Problem:   Non-ST elevation (NSTEMI) myocardial infarction [facility]) Active Problems:   HTN (hypertension)   Hyperlipidemia associated with type 2 diabetes mellitus ([place])   Essential hypertension, benign   Hypothyroidism   ESRD on dialysis [facility])   NSTEMI/CAD  Cardiology continues to follow.  Multiple prior admissions with NSTEMI in the setting of severe anemia.  She was treated medically in the past including DAPT and intervention was avoided primarily due to advanced CKD and high risk for pushing her into ESRD requiring HD and due to anemia.  DAPT had to be stopped in 10/2016 due to hemoglobin of 4.4.  Recurrent NSTEMI this admission with associated decompensated CHF and progressive CKD.  Troponin peaked to 4.14.  Continue aspirin, Plavix, metoprolol >to be switch to Toprol at discharge.  Intolerant to statins.  TTE: LVEF 55-60% and grade 1 diastolic dysfunction.  Cardiac cath 6/17 showed severe triple-vessel CAD with chronic occlusion of the distal RCA and thoracic surgery consulted.  As per TCTS follow-up, CABG on 11/20/2018 after Plavix washout (last dose 11/12/2018).  New ESRD  Nephrology continues to  follow for HD needs.  It was felt that Bactrim that was started for foot ulcer probably worsened her chronic kidney disease to ESRD.  Newly started HD this admission.  Has had multiple dialysis thus far this admission, last HD 6/22 and reportedly became transiently hypotensive at HD.  Has been set up for OP HD on TTS schedule when discharged.  Hyperkalemia, metabolic acidosis and asterixis have resolved.  Clinically appears euvolemic at this time.  Stable.    Acute on chronic diastolic CHF  TTE results as above  Volume managed by serial HD as noted above.  Compensated  Acute respiratory failure with hypoxia  Secondary to decompensated CHF.  Resolved.  Type II DM with renal complications  Reasonable inpatient control on SSI.  A1c 5.2 suggest good control.  Continue gabapentin for peripheral neuropathy.  Essential hypertension  Controlled on hydralazine 37.5 mg twice daily and metoprolol increased to 25 mg twice daily.    Reportedly transiently hypotensive at HD yesterday.  Now controlled and stable.  Hypothyroid  Clinically euthyroid.  Continue supplements.  TSH 0.326.  Consider repeating TSH in 4 weeks.  GERD  Continue PPI.  Anemia of ESRD  Prior history of GI bleed  S/p Aranesp on 6/15.  Hemoglobin stable in the high 8-9 range.  Periodically follow CBCs across HD.  Will need to monitor closely for GI bleed and/or worsening anemia while on dual antiplatelets.  Secondary hyperparathyroidism  Continue PhosLo.  Hyperlipidemia  Intolerant of statins.  May need to consider PCSK9 inhibitor as outpatient.  Gout  No acute flare.   DVT prophylaxis: Subcutaneous  heparin Code Status: Full Family Communication: I discussed in detail with patient's son on 6/19, updated care and answered questions. Disposition: To be determined pending CABG this week.   Consultants:  Nephrology Cardiology TCTS  Procedures:  HD Cardiac cath 11/12/2018:   Prox  RCA lesion is 50% stenosed.  Mid RCA to Dist RCA lesion is 80% stenosed.  Dist RCA lesion is 100% stenosed.  Prox Cx lesion is 99% stenosed.  Mid Cx lesion is 99% stenosed.  Dist Cx lesion is 90% stenosed.  1st Mrg lesion is 90% stenosed.  Ost LAD to Prox LAD lesion is 70% stenosed.  Mid LAD lesion is 30% stenosed.   1. 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 Hold Plavix.    Antimicrobials:  None   Subjective: Denies complaints.  Slightly anxious but ready for upcoming CABG.  No chest pain or dyspnea reported.  As per RN, no acute issues noted.  ROS: As above, otherwise negative  Objective:  Vitals:   11/17/18 2353 11/18/18 0429 11/18/18 0846 11/18/18 1155  BP: (!) 169/51 (!) 124/55 (!) 134/53 (!) 120/44  Pulse: 67 67 79 61  Resp:  18 18 16   Temp:  99.2 F (37.3 C) 98.6 F (37 C) 98.3 F (36.8 C)  TempSrc:  Oral Oral Oral  SpO2:  97% 98% 98%  Weight:  74.2 kg    Height:        Examination:  General exam: Pleasant elderly female, moderately built and nourished sitting up comfortably in chair this morning. Respiratory system: Clear to auscultation.  No increased work of breathing. Cardiovascular system: S1 and S2 heard, RRR.  No JVD, murmurs or pedal edema.  Telemetry personally reviewed: Sinus rhythm. Gastrointestinal system: Abdomen is nondistended, soft and nontender. No organomegaly or masses felt. Normal bowel sounds heard.  Stable. Central nervous system: Alert and oriented. No focal neurological deficits. Extremities: Symmetric 5 x 5 power.  RUE AVF +. Skin: No rashes, lesions or ulcers Psychiatry: Judgement and insight appear normal. Mood & affect: Appears to be in good spirits.     Data Reviewed: I have personally reviewed following labs and imaging studies  CBC: Recent Labs  Lab 11/14/18  0448 11/15/18 0508 11/16/18 0703 11/17/18 0619 11/18/18 0446  WBC 7.6 7.3 6.2 6.1 7.5  NEUTROABS 4.9 4.5 3.6 3.6 4.9  HGB 8.8* 9.0* 8.8* 9.1* 9.2*  HCT 28.5* 29.2* 28.3* 28.9* 28.9*  MCV 96.6 96.1 95.6 96.0 94.4  PLT 308 332 327 330 850   Basic Metabolic Panel: Recent Labs  Lab 11/12/18 0822 11/13/18 0421 11/14/18 0448 11/17/18 0619 11/17/18 1336  NA 138 137  --   --  137  K 3.8 4.3  --   --  4.0  CL 100 100  --   --  102  CO2 27 26  --   --  21*  GLUCOSE 115* 98  --   --  151*  BUN 48* 22  --   --  85*  CREATININE 3.30* 2.36* 4.01* 5.71* 5.88*  CALCIUM 8.5* 8.5*  --   --  8.5*  PHOS 4.6  --   --   --  6.0*   Liver Function Tests: Recent Labs  Lab 11/12/18 0822 11/17/18 1336  ALBUMIN 2.6* 2.8*   Coagulation Profile: Recent Labs  Lab 11/12/18 0847  INR 1.1  Cardiac Enzymes: No results for input(s): CKTOTAL, CKMB, CKMBINDEX, TROPONINI in the last 168 hours. CBG: Recent Labs  Lab 11/17/18 1104 11/17/18 1841 11/17/18 2122 11/18/18 0610 11/18/18 1150  GLUCAP 162* 125* 194* 119* 142*    No results found for this or any previous visit (from the past 240 hour(s)).       Radiology Studies: Vas US Doppler Pre Cabg  Result Date: 11/18/2018 PREOPERATIVE VASCULAR EVALUATION  Risk Factors:     Hypertension, hyperlipidemia, Diabetes, coronary artery                   disease. Comparison Study: Prior study from 04/12/18 is available for comparison. No                   significant change noted. Performing Technologist: Sharion Dove RVS  Examination Guidelines: A complete evaluation includes B-mode imaging, spectral Doppler, color Doppler, and power Doppler as needed of all accessible portions of each vessel. Bilateral testing is considered an integral part of a complete examination. Limited examinations for reoccurring indications may be performed as noted.  Right Carotid Findings: +----------+--------+--------+--------+----------------------+--------+            PSV cm/sEDV cm/sStenosisDescribe              Comments +----------+--------+--------+--------+----------------------+--------+ CCA Prox  81      19                                             +----------+--------+--------+--------+----------------------+--------+ CCA Distal78      17                                             +----------+--------+--------+--------+----------------------+--------+ ICA Prox  135     34              calcific and irregular         +----------+--------+--------+--------+----------------------+--------+ ICA Distal133     23                                             +----------+--------+--------+--------+----------------------+--------+ ECA       315     9                                              +----------+--------+--------+--------+----------------------+--------+ Portions of this table do not appear on this page. +----------+--------+-------+--------+------------+           PSV cm/sEDV cmsDescribeArm Pressure +----------+--------+-------+--------+------------+ Subclavian151                                 +----------+--------+-------+--------+------------+ +---------+--------+--+--------+-+ VertebralPSV cm/s37EDV cm/s7 +---------+--------+--+--------+-+ Left Carotid Findings: +----------+--------+--------+--------+------------+--------+           PSV cm/sEDV cm/sStenosisDescribe    Comments +----------+--------+--------+--------+------------+--------+ CCA Prox  87      11                                   +----------+--------+--------+--------+------------+--------+  CCA Distal113     15                                   +----------+--------+--------+--------+------------+--------+ ICA Prox  120     19              heterogenous         +----------+--------+--------+--------+------------+--------+ ICA Distal112     20                                    +----------+--------+--------+--------+------------+--------+ ECA       246     10                                   +----------+--------+--------+--------+------------+--------+ +----------+--------+--------+--------+------------+ SubclavianPSV cm/sEDV cm/sDescribeArm Pressure +----------+--------+--------+--------+------------+           81                      155          +----------+--------+--------+--------+------------+ +---------+--------+---+--------+--+ VertebralPSV cm/s106EDV cm/s13 +---------+--------+---+--------+--+  ABI Findings: +--------+------------------+-----+--------+-------------------+ Right   Rt Pressure (mmHg)IndexWaveformComment             +--------+------------------+-----+--------+-------------------+ Brachial                               restricted/dialysis +--------+------------------+-----+--------+-------------------+ PTA     163               1.05 biphasic                    +--------+------------------+-----+--------+-------------------+ DP      130               0.84 biphasic                    +--------+------------------+-----+--------+-------------------+ +--------+------------------+-----+---------+-------+ Left    Lt Pressure (mmHg)IndexWaveform Comment +--------+------------------+-----+---------+-------+ [id number]                    triphasic        +--------+------------------+-----+---------+-------+ PTA     158               1.02 biphasic         +--------+------------------+-----+---------+-------+ DP      136               0.88 biphasic         +--------+------------------+-----+---------+-------+ +-------+---------------+----------------+ ABI/TBIToday's ABI/TBIPrevious ABI/TBI +-------+---------------+----------------+ Right  1.05                            +-------+---------------+----------------+ Left   1.02                            +-------+---------------+----------------+   Right Doppler Findings: +-----------+--------+-----+--------+------------------------------------------+ Site       PressureIndexDoppler Comments                                   +-----------+--------+-----+--------+------------------------------------------+ Brachial  restricted/dialysis                        +-----------+--------+-----+--------+------------------------------------------+ Radial                  biphasic                                           +-----------+--------+-----+--------+------------------------------------------+ Ulnar                   biphasic                                           +-----------+--------+-----+--------+------------------------------------------+ Palmar Arch                     Doppler signal reverses with radial                                        compression and remains normal with ulnar                                  compression                                +-----------+--------+-----+--------+------------------------------------------+  Left Doppler Findings: +-----------+--------+-----+---------+--------+ Site       PressureIndexDoppler  Comments +-----------+--------+-----+---------+--------+ Brachial   155          triphasic         +-----------+--------+-----+---------+--------+ Radial                  triphasic         +-----------+--------+-----+---------+--------+ Ulnar                   triphasic         +-----------+--------+-----+---------+--------+ Palmar Arch                      WNL      +-----------+--------+-----+---------+--------+  Summary: Right Carotid: Velocities in the right ICA are consistent with a 1-39% stenosis. Left Carotid: Velocities in the left ICA are consistent with a 1-39% stenosis. Vertebrals:  Bilateral vertebral arteries demonstrate antegrade flow. Subclavians: Normal flow hemodynamics were seen in bilateral subclavian               arteries. Right ABI: Resting right ankle-brachial index is within normal range. No evidence of significant right lower extremity arterial disease. Left ABI: Resting left ankle-brachial index is within normal range. No evidence of significant left lower extremity arterial disease. Right Upper Extremity: Doppler waveforms remain within normal limits with right radial compression. Doppler waveforms remain within normal limits with right ulnar compression. Left Upper Extremity: Doppler waveforms remain within normal limits with left radial compression. Doppler waveforms remain within normal limits with left ulnar compression.  Electronically signed by Deitra Mayo MD on 11/18/2018 at 11:45:20 AM.    Final         Scheduled Meds: . aspirin EC  81 mg Oral Daily  . calcium acetate  667 mg Oral  TID WC  . Chlorhexidine Gluconate Cloth  6 each Topical Q0600  . ciprofloxacin-dexamethasone  4 drop Left EAR BID  . darbepoetin (ARANESP) injection - DIALYSIS  100 mcg Intravenous Q Mon-HD  . docusate sodium  100 mg Oral Q12H  . febuxostat  40 mg Oral Daily  . feeding supplement (NEPRO CARB STEADY)  237 mL Oral Q24H  . gabapentin  300 mg Oral QHS  . heparin  5,000 Units Subcutaneous Q8H  . hydrALAZINE  37.5 mg Oral BID  . insulin aspart  0-5 Units Subcutaneous QHS  . insulin aspart  0-9 Units Subcutaneous TID WC  . mouth rinse  15 mL Mouth Rinse BID  . metoprolol tartrate  25 mg Oral BID  . pantoprazole  40 mg Oral QAC breakfast  . sodium chloride flush  3 mL Intravenous Q12H  . thyroid  120 mg Oral Q0600   Continuous Infusions: . sodium chloride       LOS: 11 days     Vernell Leep, MD, FACP, [facility]. [facility]  To contact the attending provider between 7A-7P or the covering provider during after hours 7P-7A, please log into the web site [email or web address] and access using universal [place] password for that web site. If you do not have the password, please call the hospital  operator.  11/18/2018, 12:34 PM

## 2018-11-18 NOTE — Progress Notes (Signed)
  [facility] Progress Note   Assessment/ Plan:    1. Severe 3 vessel CAD--> plan is for CABG on 6/25 this week    2.ESRD: New start dialysis on 6/12.  16 g needles, BFR 300.  CLIP'd to [facility] TTS 11:30.  HD tomorrow. Keep on MWF schedule for now. Labs good. No extra vol, dropped BP on HD yest 1st time.   3. 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 Nutrition: albumin 2.6, prostat  6. Hypertension: on metop, hydral  7.  Dispo: awaiting CABG  Kelly Splinter, MD 11/18/2018, 2:47 PM   Subjective:    No new c/o's, in good spirits.   Up in chair    Objective:   BP (!) 120/44 (BP Location: Left Arm)   Pulse 61   Temp 98.3 F (36.8 C) (Oral)   Resp 16   Ht 5\' 4"  (1.626 m)   Wt 74.2 kg   SpO2 98%   BMI 28.08 kg/m   Physical Exam: Gen: older woman, NAD, sitting in chair CVS: RRR no m/r/g Resp: normal WOB, bibasilar breath sounds Abd: soft nontender NABS Ext: trace LE edema ACCESS: R UE AVF +T/B  Labs: BMET Recent Labs  Lab 11/12/18 0822 11/13/18 0421 11/14/18 0448 11/17/18 0619 11/17/18 1336  NA 138 137  --   --  137  K 3.8 4.3  --   --  4.0  CL 100 100  --   --  102  CO2 27 26  --   --  21*  GLUCOSE 115* 98  --   --  151*  BUN 48* 22  --   --  85*  CREATININE 3.30* 2.36* 4.01* 5.71* 5.88*  CALCIUM 8.5* 8.5*  --   --  8.5*  PHOS 4.6  --   --   --  6.0*   CBC Recent Labs  Lab 11/15/18 0508 11/16/18 0703 11/17/18 0619 11/18/18 0446  WBC 7.3 6.2 6.1 7.5  NEUTROABS 4.5 3.6 3.6 4.9  HGB 9.0* 8.8* 9.1* 9.2*  HCT 29.2* 28.3* 28.9* 28.9*  MCV 96.1 95.6 96.0 94.4  PLT 332 327 330 332    @IMGRELPRIORS @ Medications:    . aspirin EC  81 mg Oral Daily  . calcium acetate  667 mg Oral TID WC  . Chlorhexidine Gluconate Cloth  6 each Topical Q0600  . ciprofloxacin-dexamethasone  4 drop Left EAR BID  . darbepoetin (ARANESP) injection - DIALYSIS  100 mcg Intravenous Q  Mon-HD  . docusate sodium  100 mg Oral Q12H  . febuxostat  40 mg Oral Daily  . feeding supplement (NEPRO CARB STEADY)  237 mL Oral Q24H  . gabapentin  300 mg Oral QHS  . heparin  5,000 Units Subcutaneous Q8H  . hydrALAZINE  37.5 mg Oral BID  . insulin aspart  0-5 Units Subcutaneous QHS  . insulin aspart  0-9 Units Subcutaneous TID WC  . mouth rinse  15 mL Mouth Rinse BID  . metoprolol tartrate  25 mg Oral BID  . pantoprazole  40 mg Oral QAC breakfast  . sodium chloride flush  3 mL Intravenous Q12H  . thyroid  120 mg Oral Q0600

## 2018-11-18 NOTE — Progress Notes (Signed)
CARDIAC REHAB PHASE I   PRE:  Rate/Rhythm: 47 SR  BP:  Sitting: 124/49      SaO2: 96 RA  MODE:  Ambulation: 100 ft   POST:  Rate/Rhythm: 77 SR  BP:  Sitting: 148/48    SaO2: 98 RA   Pt ambulated 162ft in hallway handheld assist with wobbly gait. Pt denies CP or SOB. Reinforced pre-op education with pt. Pt obtaining 1125 on IS. Encouraged continued ambulation as able, and continued IS use. Will continue to follow.  [id number] [name], RN BSN 11/18/2018 10:26 AM

## 2018-11-19 ENCOUNTER — Inpatient Hospital Stay (HOSPITAL_COMMUNITY): Payer: Medicare HMO

## 2018-11-19 DIAGNOSIS — I5033 Acute on chronic diastolic (congestive) heart failure: Secondary | ICD-10-CM

## 2018-11-19 DIAGNOSIS — I2510 Atherosclerotic heart disease of native coronary artery without angina pectoris: Secondary | ICD-10-CM

## 2018-11-19 DIAGNOSIS — J9601 Acute respiratory failure with hypoxia: Secondary | ICD-10-CM

## 2018-11-19 LAB — CBC
HCT: 29.5 % — ABNORMAL LOW (ref 36.0–46.0)
Hemoglobin: 9.4 g/dL — ABNORMAL LOW (ref 12.0–15.0)
MCH: 30.2 pg (ref 26.0–34.0)
MCHC: 31.9 g/dL (ref 30.0–36.0)
MCV: 94.9 fL (ref 80.0–100.0)
Platelets: 328 10*3/uL (ref 150–400)
RBC: 3.11 MIL/uL — ABNORMAL LOW (ref 3.87–5.11)
RDW: 14.4 % (ref 11.5–15.5)
WBC: 6.8 10*3/uL (ref 4.0–10.5)
nRBC: 0 % (ref 0.0–0.2)

## 2018-11-19 LAB — URINALYSIS, ROUTINE W REFLEX MICROSCOPIC
Bilirubin Urine: NEGATIVE
Glucose, UA: 50 mg/dL — AB
Hgb urine dipstick: NEGATIVE
Ketones, ur: NEGATIVE mg/dL
Leukocytes,Ua: NEGATIVE
Nitrite: NEGATIVE
Protein, ur: >=300 mg/dL — AB
Specific Gravity, Urine: 1.018 (ref 1.005–1.030)
pH: 5 (ref 5.0–8.0)

## 2018-11-19 LAB — RENAL FUNCTION PANEL
Albumin: 2.9 g/dL — ABNORMAL LOW (ref 3.5–5.0)
Anion gap: 15 (ref 5–15)
BUN: 95 mg/dL — ABNORMAL HIGH (ref 8–23)
CO2: 20 mmol/L — ABNORMAL LOW (ref 22–32)
Calcium: 8.7 mg/dL — ABNORMAL LOW (ref 8.9–10.3)
Chloride: 100 mmol/L (ref 98–111)
Creatinine, Ser: 5.33 mg/dL — ABNORMAL HIGH (ref 0.44–1.00)
GFR calc Af Amer: 8 mL/min — ABNORMAL LOW (ref >60)
GFR calc non Af Amer: 7 mL/min — ABNORMAL LOW (ref >60)
Glucose, Bld: 130 mg/dL — ABNORMAL HIGH (ref 70–99)
Phosphorus: 5.8 mg/dL — ABNORMAL HIGH (ref 2.5–4.6)
Potassium: 4.1 mmol/L (ref 3.5–5.1)
Sodium: 135 mmol/L (ref 135–145)

## 2018-11-19 LAB — CREATININE, SERUM
Creatinine, Ser: 5.54 mg/dL — ABNORMAL HIGH (ref 0.44–1.00)
GFR calc Af Amer: 8 mL/min — ABNORMAL LOW (ref >60)
GFR calc non Af Amer: 7 mL/min — ABNORMAL LOW (ref >60)

## 2018-11-19 LAB — PROTIME-INR
INR: 1 (ref 0.8–1.2)
Prothrombin Time: 13.5 s (ref 11.4–15.2)

## 2018-11-19 LAB — GLUCOSE, CAPILLARY
Glucose-Capillary: 128 mg/dL — ABNORMAL HIGH (ref 70–99)
Glucose-Capillary: 173 mg/dL — ABNORMAL HIGH (ref 70–99)
Glucose-Capillary: 146 mg/dL — ABNORMAL HIGH (ref 70–99)
Glucose-Capillary: 224 mg/dL — ABNORMAL HIGH (ref 70–99)

## 2018-11-19 LAB — SURGICAL PCR SCREEN
MRSA, PCR: NEGATIVE
Staphylococcus aureus: NEGATIVE

## 2018-11-19 MED ORDER — VANCOMYCIN HCL 10 G IV SOLR
1250.0000 mg | INTRAVENOUS | Status: AC
  Administered 2018-11-20: 1250 mg via INTRAVENOUS
  Filled 2018-11-19 (×2): qty 1250

## 2018-11-19 MED ORDER — SODIUM CHLORIDE 0.9 % IV SOLN
750.0000 mg | INTRAVENOUS | Status: DC
  Filled 2018-11-19 (×2): qty 750

## 2018-11-19 MED ORDER — METOPROLOL TARTRATE 12.5 MG HALF TABLET
12.5000 mg | ORAL_TABLET | Freq: Once | ORAL | Status: AC
  Administered 2018-11-20: 12.5 mg via ORAL
  Filled 2018-11-19: qty 1

## 2018-11-19 MED ORDER — NOREPINEPHRINE 4 MG/250ML-% IV SOLN
0.0000 ug/min | INTRAVENOUS | Status: DC
  Filled 2018-11-19 (×2): qty 250

## 2018-11-19 MED ORDER — TRANEXAMIC ACID 1000 MG/10ML IV SOLN
1.5000 mg/kg/h | INTRAVENOUS | Status: DC
  Filled 2018-11-19 (×2): qty 25

## 2018-11-19 MED ORDER — INSULIN REGULAR(HUMAN) IN NACL 100-0.9 UT/100ML-% IV SOLN
INTRAVENOUS | Status: DC
  Filled 2018-11-19 (×2): qty 100

## 2018-11-19 MED ORDER — ALPRAZOLAM 0.25 MG PO TABS
0.2500 mg | ORAL_TABLET | ORAL | Status: DC | PRN
  Administered 2018-11-19: 0.25 mg via ORAL
  Filled 2018-11-19: qty 1

## 2018-11-19 MED ORDER — CHLORHEXIDINE GLUCONATE CLOTH 2 % EX PADS
6.0000 | MEDICATED_PAD | Freq: Once | CUTANEOUS | Status: AC
  Administered 2018-11-19: 6 via TOPICAL

## 2018-11-19 MED ORDER — PLASMA-LYTE 148 IV SOLN
INTRAVENOUS | Status: AC
  Administered 2018-11-20: 500 mL
  Filled 2018-11-19 (×2): qty 2.5

## 2018-11-19 MED ORDER — PHENYLEPHRINE HCL-NACL 20-0.9 MG/250ML-% IV SOLN
30.0000 ug/min | INTRAVENOUS | Status: DC
  Filled 2018-11-19 (×2): qty 250

## 2018-11-19 MED ORDER — HEPARIN SODIUM (PORCINE) 1000 UNIT/ML DIALYSIS: 2000.0000 [IU] | INTRAMUSCULAR | Status: DC | PRN

## 2018-11-19 MED ORDER — DIAZEPAM 2 MG PO TABS
2.0000 mg | ORAL_TABLET | Freq: Once | ORAL | Status: AC
  Administered 2018-11-20: 2 mg via ORAL
  Filled 2018-11-19: qty 1

## 2018-11-19 MED ORDER — DEXMEDETOMIDINE HCL IN NACL 400 MCG/100ML IV SOLN
0.1000 ug/kg/h | INTRAVENOUS | Status: DC
  Filled 2018-11-19 (×2): qty 100

## 2018-11-19 MED ORDER — POTASSIUM CHLORIDE 2 MEQ/ML IV SOLN
80.0000 meq | INTRAVENOUS | Status: DC
  Filled 2018-11-19 (×2): qty 40

## 2018-11-19 MED ORDER — CHLORHEXIDINE GLUCONATE CLOTH 2 % EX PADS: 6.0000 | MEDICATED_PAD | Freq: Once | CUTANEOUS | Status: DC

## 2018-11-19 MED ORDER — MAGNESIUM SULFATE 50 % IJ SOLN
40.0000 meq | INTRAMUSCULAR | Status: DC
  Filled 2018-11-19 (×2): qty 9.85

## 2018-11-19 MED ORDER — SODIUM CHLORIDE 0.9 % IV SOLN
1.5000 g | INTRAVENOUS | Status: AC
  Administered 2018-11-20: 1.5 g via INTRAVENOUS
  Filled 2018-11-19 (×2): qty 1.5

## 2018-11-19 MED ORDER — SODIUM CHLORIDE 0.9 % IV SOLN
INTRAVENOUS | Status: DC
  Filled 2018-11-19 (×2): qty 30

## 2018-11-19 MED ORDER — TRANEXAMIC ACID (OHS) BOLUS VIA INFUSION
15.0000 mg/kg | INTRAVENOUS | Status: AC
  Administered 2018-11-20: 7.5 mg via INTRAVENOUS
  Filled 2018-11-19: qty 1137

## 2018-11-19 MED ORDER — CHLORHEXIDINE GLUCONATE 0.12 % MT SOLN
15.0000 mL | Freq: Once | OROMUCOSAL | Status: AC
  Administered 2018-11-20: 15 mL via OROMUCOSAL
  Filled 2018-11-19: qty 15

## 2018-11-19 MED ORDER — MILRINONE LACTATE IN DEXTROSE 20-5 MG/100ML-% IV SOLN
0.3000 ug/kg/min | INTRAVENOUS | Status: DC
  Filled 2018-11-19 (×2): qty 100

## 2018-11-19 MED ORDER — TRANEXAMIC ACID (OHS) PUMP PRIME SOLUTION
2.0000 mg/kg | INTRAVENOUS | Status: DC
  Filled 2018-11-19 (×2): qty 1.52

## 2018-11-19 MED ORDER — DOPAMINE-DEXTROSE 3.2-5 MG/ML-% IV SOLN
0.0000 ug/kg/min | INTRAVENOUS | Status: DC
  Filled 2018-11-19 (×2): qty 250

## 2018-11-19 MED ORDER — NITROGLYCERIN IN D5W 200-5 MCG/ML-% IV SOLN
2.0000 ug/min | INTRAVENOUS | Status: AC
  Administered 2018-11-20: 5 ug/min via INTRAVENOUS
  Filled 2018-11-19 (×2): qty 250

## 2018-11-19 MED ORDER — BISACODYL 5 MG PO TBEC
5.0000 mg | DELAYED_RELEASE_TABLET | Freq: Once | ORAL | Status: AC
  Administered 2018-11-19: 5 mg via ORAL
  Filled 2018-11-19: qty 1

## 2018-11-19 MED ORDER — EPINEPHRINE PF 1 MG/ML IJ SOLN
0.0000 ug/min | INTRAVENOUS | Status: DC
  Filled 2018-11-19 (×2): qty 4

## 2018-11-19 NOTE — Progress Notes (Signed)
7 Days Post-Op Procedure(s) (LRB): LEFT HEART CATH AND CORONARY ANGIOGRAPHY (N/A) Subjective: No complaints, feels well A little anxious about surgery  Objective: Vital signs in last 24 hours: Temp:  [98.2 F (36.8 C)-98.6 F (37 C)] 98.2 F (36.8 C) (06/24 0429) Pulse Rate:  [58-79] 58 (06/24 0429) Cardiac Rhythm: Normal sinus rhythm (06/23 1904) Resp:  [16-18] 18 (06/24 0429) BP: (120-152)/(44-53) 152/46 (06/24 0429) SpO2:  [95 %-99 %] 95 % (06/24 0429) Weight:  [75.8 kg] 75.8 kg (06/24 0429)  Hemodynamic parameters for last 24 hours:    Intake/Output from previous day: 06/23 0701 - 06/24 0700 In: 1217 [P.O.:1217] Out: -  Intake/Output this shift: No intake/output data recorded.  General appearance: alert, cooperative and no distress  Lab Results: Recent Labs    11/17/18 0619 11/18/18 0446  WBC 6.1 7.5  HGB 9.1* 9.2*  HCT 28.9* 28.9*  PLT 330 332   BMET:  Recent Labs    11/17/18 1336 11/19/18 0536  NA 137  --   K 4.0  --   CL 102  --   CO2 21*  --   GLUCOSE 151*  --   BUN 85*  --   CREATININE 5.88* 5.54*  CALCIUM 8.5*  --     PT/INR: No results for input(s): LABPROT, INR in the last 72 hours. ABG No results found for: PHART, HCO3, TCO2, ACIDBASEDEF, O2SAT CBG (last 3)  Recent Labs    11/18/18 1710 11/18/18 2142 11/19/18 0555  GLUCAP 120* 143* 128*    Assessment/Plan: S/P Procedure(s) (LRB): LEFT HEART CATH AND CORONARY ANGIOGRAPHY (N/A) - For CABG tomorrow  All questions answered preop orders written   LOS: 12 days    Melrose Nakayama 11/19/2018

## 2018-11-19 NOTE — Progress Notes (Signed)
PROGRESS NOTE    [name]  RAQ:[id number] DOB: 01/18/1941 DOA: 11/06/2018 PCP: Doree Albee, MD   Brief Narrative:  78 year old WF PMHx  DM 2 controlled with complication, HTN, CKD stage 5, CAD s/p STEMI treated medically due to advanced CKD and anemia, gout, chronic combined systolic and diastolic CHF, Hypothyroid,   Presented to ED due to chest pain and dyspnea. She was diagnosed with NSTEMI, acute on chronic diastolic CHF, progressive CKD to ESRD. Nephrology consulted and HD initiated this admission. Cardiology consulted, cardiac cath 6/17 showed severe three-vessel disease, TCTS consulted and plan CABG after Plavix washout (last dose 11/12/2018). Patient for CABG on 11/20/2018. Stable.     Subjective: A/O x4, negative S OB, negative CP, negative abdominal pain, negative N/V   Assessment & Plan:   Principal Problem:   Non-ST elevation (NSTEMI) myocardial infarction [facility]) Active Problems:   HTN (hypertension)   Hyperlipidemia associated with type 2 diabetes mellitus ([place])   Essential hypertension, benign   Hypothyroidism   ESRD on dialysis [facility])   NSTEMI/CAD  Cardiology continues to follow.  Multiple prior admissions with NSTEMI in the setting of severe anemia.  She was treated medically in the past including DAPT and intervention was avoided primarily due to advanced CKD and high risk for pushing her into ESRD requiring HD and due to anemia.  DAPT had to be stopped in 10/2016 due to hemoglobin of 4.4.  Recurrent NSTEMI this admission with associated decompensated CHF and progressive CKD.  Troponin peaked to 4.14.  Continue aspirin, Plavix, metoprolol >to be switch to Toprol at discharge.  Intolerant to statins.  TTE: LVEF 55-60% and grade 1 diastolic dysfunction.  Cardiac cath 6/17 showed severe triple-vessel CAD with chronic occlusion of the distal RCA and thoracic surgery consulted.  As per TCTS follow-up, CABG on 11/20/2018 after Plavix washout (last dose  11/12/2018).   New ESRD  Nephrology continues to follow for HD needs.  It was felt that Bactrim that was started for foot ulcer probably worsened her chronic kidney disease to ESRD.  Newly started HD this admission.  Has had multiple dialysis thus far this admission, last HD 6/22 and reportedly became transiently hypotensive at HD.  Has been set up for OP HD on TTS schedule when discharged.  Hyperkalemia, metabolic acidosis and asterixis have resolved.  Clinically appears euvolemic at this time.  Stable.     Acute on chronic diastolic CHF  TTE results as above  Volume managed by serial HD as noted above.  Compensated   Acute respiratory failure with hypoxia  Secondary to decompensated CHF.  Resolved.   Type II DM with renal complications  Reasonable inpatient control on SSI.  A1c 5.2 suggest good control.  Continue gabapentin for peripheral neuropathy.   Essential hypertension  Controlled on hydralazine 37.5 mg twice daily and metoprolol increased to 25 mg twice daily.    Reportedly transiently hypotensive at HD yesterday.  Now controlled and stable.   Hypothyroid  Clinically euthyroid.  Continue supplements.  TSH 0.326.  Consider repeating TSH in 4 weeks.   GERD  Continue PPI.   Anemia of ESRD  Prior history of GI bleed  S/p Aranesp on 6/15.  Hemoglobin stable in the high 8-9 range.  Periodically follow CBCs across HD.  Will need to monitor closely for GI bleed and/or worsening anemia while on dual antiplatelets.   Secondary hyperparathyroidism  Continue PhosLo.   Hyperlipidemia  Intolerant of statins.  May need to consider PCSK9 inhibitor as outpatient.  Gout  No acute flare.    DVT prophylaxis: Subcu heparin Code Status: Full Family Communication: None Disposition Plan: TBD   Consultants:  Nephrology Cardiothoracic surgery Cardiology   Procedures/Significant Events:     I have personally reviewed and interpreted all  radiology studies and my findings are as above.  VENTILATOR SETTINGS:    Cultures   Antimicrobials:    Devices    LINES / TUBES:      Continuous Infusions:  sodium chloride       Objective: Vitals:   11/18/18 1155 11/18/18 1728 11/18/18 1938 11/19/18 0429  BP: (!) 120/44 (!) 143/48 (!) 137/49 (!) 152/46  Pulse: 61 66 70 (!) 58  Resp: 16 18 18 18   Temp: 98.3 F (36.8 C)  98.5 F (36.9 C) 98.2 F (36.8 C)  TempSrc: Oral  Oral Oral  SpO2: 98%  99% 95%  Weight:    75.8 kg  Height:        Intake/Output Summary (Last 24 hours) at 11/19/2018 5638 Last data filed at 11/19/2018 7564 Gross per 24 hour  Intake 1017 ml  Output --  Net 1017 ml   Filed Weights   11/17/18 1720 11/18/18 0429 11/19/18 0429  Weight: 74.8 kg 74.2 kg 75.8 kg    Examination:  General: A/O x4 no acute respiratory distress Eyes: negative scleral hemorrhage, negative anisocoria, negative icterus ENT: Negative Runny nose, negative gingival bleeding, Neck:  Negative scars, masses, torticollis, lymphadenopathy, JVD Lungs: Clear to auscultation bilaterally without wheezes or crackles Cardiovascular: Regular rate and rhythm without murmur gallop or rub normal S1 and S2 Abdomen: negative abdominal pain, nondistended, positive soft, bowel sounds, no rebound, no ascites, no appreciable mass Extremities: No significant cyanosis, clubbing, or edema bilateral lower extremities Skin: Negative rashes, lesions, ulcers Psychiatric:  Negative depression, negative anxiety, negative fatigue, negative mania  Central nervous system:  Cranial nerves II through XII intact, tongue/uvula midline, all extremities muscle strength 5/5, sensation intact throughout,  negative dysarthria, negative expressive aphasia, negative receptive aphasia.  .     Data Reviewed: Care during the described time interval was provided by me .  I have reviewed this patient's available data, including medical history, events of note,  physical examination, and all test results as part of my evaluation.   CBC: Recent Labs  Lab 11/14/18 0448 11/15/18 0508 11/16/18 0703 11/17/18 0619 11/18/18 0446  WBC 7.6 7.3 6.2 6.1 7.5  NEUTROABS 4.9 4.5 3.6 3.6 4.9  HGB 8.8* 9.0* 8.8* 9.1* 9.2*  HCT 28.5* 29.2* 28.3* 28.9* 28.9*  MCV 96.6 96.1 95.6 96.0 94.4  PLT 308 332 327 330 332   Basic Metabolic Panel: Recent Labs  Lab 11/13/18 0421 11/14/18 0448 11/17/18 0619 11/17/18 1336 11/19/18 0536  NA 137  --   --  137  --   K 4.3  --   --  4.0  --   CL 100  --   --  102  --   CO2 26  --   --  21*  --   GLUCOSE 98  --   --  151*  --   BUN 22  --   --  85*  --   CREATININE 2.36* 4.01* 5.71* 5.88* 5.54*  CALCIUM 8.5*  --   --  8.5*  --   PHOS  --   --   --  6.0*  --    GFR: Estimated Creatinine Clearance: 8.2 mL/min (A) (by C-G formula based on SCr of 5.54 mg/dL (H)).  Liver Function Tests: Recent Labs  Lab 11/17/18 1336  ALBUMIN 2.8*   No results for input(s): LIPASE, AMYLASE in the last 168 hours. No results for input(s): AMMONIA in the last 168 hours. Coagulation Profile: No results for input(s): INR, PROTIME in the last 168 hours. Cardiac Enzymes: No results for input(s): CKTOTAL, CKMB, CKMBINDEX, TROPONINI in the last 168 hours. BNP (last 3 results) No results for input(s): PROBNP in the last 8760 hours. HbA1C: No results for input(s): HGBA1C in the last 72 hours. CBG: Recent Labs  Lab 11/18/18 0610 11/18/18 1150 11/18/18 1710 11/18/18 2142 11/19/18 0555  GLUCAP 119* 142* 120* 143* 128*   Lipid Profile: No results for input(s): CHOL, HDL, LDLCALC, TRIG, CHOLHDL, LDLDIRECT in the last 72 hours. Thyroid Function Tests: No results for input(s): TSH, T4TOTAL, FREET4, T3FREE, THYROIDAB in the last 72 hours. Anemia Panel: No results for input(s): VITAMINB12, FOLATE, FERRITIN, TIBC, IRON, RETICCTPCT in the last 72 hours. Urine analysis:    Component Value Date/Time   COLORURINE YELLOW 03/09/2017 0801     APPEARANCEUR CLEAR 03/09/2017 0801   LABSPEC 1.013 03/09/2017 0801   PHURINE 5.0 03/09/2017 0801   GLUCOSEU 50 (A) 03/09/2017 0801   HGBUR NEGATIVE 03/09/2017 0801   BILIRUBINUR NEGATIVE 03/09/2017 0801   KETONESUR NEGATIVE 03/09/2017 0801   PROTEINUR 30 (A) 03/09/2017 0801   UROBILINOGEN 0.2 02/08/2015 1615   NITRITE NEGATIVE 03/09/2017 0801   LEUKOCYTESUR NEGATIVE 03/09/2017 0801   Sepsis Labs: @LABRCNTIP (procalcitonin:4,lacticidven:4)  )No results found for this or any previous visit (from the past 240 hour(s)).       Radiology Studies: Vas US Doppler Pre Cabg  Result Date: 11/18/2018 PREOPERATIVE VASCULAR EVALUATION  Risk Factors:     Hypertension, hyperlipidemia, Diabetes, coronary artery                   disease. Comparison Study: Prior study from 04/12/18 is available for comparison. No                   significant change noted. Performing Technologist: Sharion Dove RVS  Examination Guidelines: A complete evaluation includes B-mode imaging, spectral Doppler, color Doppler, and power Doppler as needed of all accessible portions of each vessel. Bilateral testing is considered an integral part of a complete examination. Limited examinations for reoccurring indications may be performed as noted.  Right Carotid Findings: +----------+--------+--------+--------+----------------------+--------+             PSV cm/s EDV cm/s Stenosis Describe               Comments  +----------+--------+--------+--------+----------------------+--------+  CCA Prox   81       19                                                 +----------+--------+--------+--------+----------------------+--------+  CCA Distal 78       17                                                 +----------+--------+--------+--------+----------------------+--------+  ICA Prox   135      34                calcific and irregular           +----------+--------+--------+--------+----------------------+--------+  ICA Distal 133      23                                                  +----------+--------+--------+--------+----------------------+--------+  ECA        315      9                                                  +----------+--------+--------+--------+----------------------+--------+ Portions of this table do not appear on this page. +----------+--------+-------+--------+------------+             PSV cm/s EDV cms Describe Arm Pressure  +----------+--------+-------+--------+------------+  Subclavian 151                                     +----------+--------+-------+--------+------------+ +---------+--------+--+--------+-+  Vertebral PSV cm/s 37 EDV cm/s 7  +---------+--------+--+--------+-+ Left Carotid Findings: +----------+--------+--------+--------+------------+--------+             PSV cm/s EDV cm/s Stenosis Describe     Comments  +----------+--------+--------+--------+------------+--------+  CCA Prox   87       11                                       +----------+--------+--------+--------+------------+--------+  CCA Distal 113      15                                       +----------+--------+--------+--------+------------+--------+  ICA Prox   120      19                heterogenous           +----------+--------+--------+--------+------------+--------+  ICA Distal 112      20                                       +----------+--------+--------+--------+------------+--------+  ECA        246      10                                       +----------+--------+--------+--------+------------+--------+ +----------+--------+--------+--------+------------+  Subclavian PSV cm/s EDV cm/s Describe Arm Pressure  +----------+--------+--------+--------+------------+             81                         155           +----------+--------+--------+--------+------------+ +---------+--------+---+--------+--+  Vertebral PSV cm/s 106 EDV cm/s 13  +---------+--------+---+--------+--+  ABI Findings:  +--------+------------------+-----+--------+-------------------+  Right    Rt Pressure (mmHg) Index Waveform Comment              +--------+------------------+-----+--------+-------------------+  Brachial  restricted/dialysis  +--------+------------------+-----+--------+-------------------+  PTA      163                1.05  biphasic                      +--------+------------------+-----+--------+-------------------+  DP       130                0.84  biphasic                      +--------+------------------+-----+--------+-------------------+ +--------+------------------+-----+---------+-------+  Left     Lt Pressure (mmHg) Index Waveform  Comment  +--------+------------------+-----+---------+-------+  Brachial 155                      triphasic          +--------+------------------+-----+---------+-------+  PTA      158                1.02  biphasic           +--------+------------------+-----+---------+-------+  DP       136                0.88  biphasic           +--------+------------------+-----+---------+-------+ +-------+---------------+----------------+  ABI/TBI Today's ABI/TBI Previous ABI/TBI  +-------+---------------+----------------+  Right   1.05                              +-------+---------------+----------------+  Left    1.02                              +-------+---------------+----------------+  Right Doppler Findings: +-----------+--------+-----+--------+------------------------------------------+  Site        Pressure Index Doppler  Comments                                    +-----------+--------+-----+--------+------------------------------------------+  Brachial                            restricted/dialysis                         +-----------+--------+-----+--------+------------------------------------------+  Radial                     biphasic                                              +-----------+--------+-----+--------+------------------------------------------+  Ulnar                      biphasic                                             +-----------+--------+-----+--------+------------------------------------------+  Palmar Arch                         Doppler signal reverses with radial  compression and remains normal with ulnar                                        compression                                 +-----------+--------+-----+--------+------------------------------------------+  Left Doppler Findings: +-----------+--------+-----+---------+--------+  Site        Pressure Index Doppler   Comments  +-----------+--------+-----+---------+--------+  Brachial    155            triphasic           +-----------+--------+-----+---------+--------+  Radial                     triphasic           +-----------+--------+-----+---------+--------+  Ulnar                      triphasic           +-----------+--------+-----+---------+--------+  Palmar Arch                          WNL       +-----------+--------+-----+---------+--------+  Summary: Right Carotid: Velocities in the right ICA are consistent with a 1-39% stenosis. Left Carotid: Velocities in the left ICA are consistent with a 1-39% stenosis. Vertebrals:  Bilateral vertebral arteries demonstrate antegrade flow. Subclavians: Normal flow hemodynamics were seen in bilateral subclavian              arteries. Right ABI: Resting right ankle-brachial index is within normal range. No evidence of significant right lower extremity arterial disease. Left ABI: Resting left ankle-brachial index is within normal range. No evidence of significant left lower extremity arterial disease. Right Upper Extremity: Doppler waveforms remain within normal limits with right radial compression. Doppler waveforms remain within normal limits with right ulnar compression. Left Upper Extremity: Doppler waveforms remain  within normal limits with left radial compression. Doppler waveforms remain within normal limits with left ulnar compression.  Electronically signed by Deitra Mayo MD on 11/18/2018 at 11:45:20 AM.    Final         Scheduled Meds:  aspirin EC  81 mg Oral Daily   bisacodyl  5 mg Oral Once   calcium acetate  667 mg Oral TID WC   Chlorhexidine Gluconate Cloth  6 each Topical Q0600   ciprofloxacin-dexamethasone  4 drop Left EAR BID   darbepoetin (ARANESP) injection - DIALYSIS  100 mcg Intravenous Q Mon-HD   docusate sodium  100 mg Oral Q12H   febuxostat  40 mg Oral Daily   feeding supplement (NEPRO CARB STEADY)  237 mL Oral Q24H   gabapentin  300 mg Oral QHS   heparin  5,000 Units Subcutaneous Q8H   hydrALAZINE  37.5 mg Oral BID   insulin aspart  0-5 Units Subcutaneous QHS   insulin aspart  0-9 Units Subcutaneous TID WC   mouth rinse  15 mL Mouth Rinse BID   metoprolol tartrate  25 mg Oral BID   pantoprazole  40 mg Oral QAC breakfast   sodium chloride flush  3 mL Intravenous Q12H   thyroid  120 mg Oral Q0600   Continuous Infusions:  sodium chloride       LOS: 12 days   The  patient is critically ill with multiple organ systems failure and requires high complexity decision making for assessment and support, frequent evaluation and titration of therapies, application of advanced monitoring technologies and extensive interpretation of multiple databases. Critical Care Time devoted to patient care services described in this note  Time spent: 40 minutes     Cynde Menard, Geraldo Docker, MD [facility] Pager [phone]  If 7PM-7AM, please contact night-coverage [email or web address] Password [email or web address] 11/19/2018, 9:06 AM

## 2018-11-19 NOTE — Progress Notes (Addendum)
Progress Note  Patient Name: [name] Date of Encounter: 11/19/2018  Primary Cardiologist: Carlyle Dolly, MD   Subjective   Pt feeling well today. Long discussion about expectations of CABG, scheduled for tomorrow AM. Denies recurrent chest pain, SOB.   Inpatient Medications    Scheduled Meds:  aspirin EC  81 mg Oral Daily   calcium acetate  667 mg Oral TID WC   Chlorhexidine Gluconate Cloth  6 each Topical Q0600   ciprofloxacin-dexamethasone  4 drop Left EAR BID   darbepoetin (ARANESP) injection - DIALYSIS  100 mcg Intravenous Q Mon-HD   docusate sodium  100 mg Oral Q12H   febuxostat  40 mg Oral Daily   feeding supplement (NEPRO CARB STEADY)  237 mL Oral Q24H   gabapentin  300 mg Oral QHS   heparin  5,000 Units Subcutaneous Q8H   hydrALAZINE  37.5 mg Oral BID   insulin aspart  0-5 Units Subcutaneous QHS   insulin aspart  0-9 Units Subcutaneous TID WC   mouth rinse  15 mL Mouth Rinse BID   metoprolol tartrate  25 mg Oral BID   pantoprazole  40 mg Oral QAC breakfast   sodium chloride flush  3 mL Intravenous Q12H   thyroid  120 mg Oral Q0600   Continuous Infusions:  sodium chloride     PRN Meds: sodium chloride, acetaminophen **OR** acetaminophen, heparin, nitroGLYCERIN, ondansetron **OR** ondansetron (ZOFRAN) IV, polyethylene glycol, sodium chloride flush   Vital Signs    Vitals:   11/18/18 1155 11/18/18 1728 11/18/18 1938 11/19/18 0429  BP: (!) 120/44 (!) 143/48 (!) 137/49 (!) 152/46  Pulse: 61 66 70 (!) 58  Resp: 16 18 18 18   Temp: 98.3 F (36.8 C)  98.5 F (36.9 C) 98.2 F (36.8 C)  TempSrc: Oral  Oral Oral  SpO2: 98%  99% 95%  Weight:    75.8 kg  Height:        Intake/Output Summary (Last 24 hours) at 11/19/2018 0715 Last data filed at 11/18/2018 1936 Gross per 24 hour  Intake 1217 ml  Output --  Net 1217 ml   Filed Weights   11/17/18 1720 11/18/18 0429 11/19/18 0429  Weight: 74.8 kg 74.2 kg 75.8 kg    Physical  Exam   General: Well developed, well nourished, NAD Skin: Warm, dry, intact  Neck: Negative for carotid bruits. No JVD Lungs:Clear to ausculation bilaterally. No wheezes, rales, or rhonchi. Breathing is unlabored. Cardiovascular: RRR with S1 S2. No murmurs, rubs, gallops, or LV heave appreciated. Extremities: No edema. No clubbing or cyanosis. DP/PT pulses 1+ bilaterally Neuro: Alert and oriented. No focal deficits. No facial asymmetry. MAE spontaneously. Psych: Responds to questions appropriately with normal affect.    Labs    Chemistry Recent Labs  Lab 11/12/18 1751 11/13/18 0421  11/17/18 0619 11/17/18 1336 11/19/18 0536  NA 138 137  --   --  137  --   K 3.8 4.3  --   --  4.0  --   CL 100 100  --   --  102  --   CO2 27 26  --   --  21*  --   GLUCOSE 115* 98  --   --  151*  --   BUN 48* 22  --   --  85*  --   CREATININE 3.30* 2.36*   < > 5.71* 5.88* 5.54*  CALCIUM 8.5* 8.5*  --   --  8.5*  --  ALBUMIN 2.6*  --   --   --  2.8*  --   GFRNONAA 13* 19*   < > 7* 6* 7*  GFRAA 15* 22*   < > 8* 7* 8*  ANIONGAP 11 11  --   --  14  --    < > = values in this interval not displayed.     Hematology Recent Labs  Lab 11/16/18 0703 11/17/18 0619 11/18/18 0446  WBC 6.2 6.1 7.5  RBC 2.96* 3.01* 3.06*  HGB 8.8* 9.1* 9.2*  HCT 28.3* 28.9* 28.9*  MCV 95.6 96.0 94.4  MCH 29.7 30.2 30.1  MCHC 31.1 31.5 31.8  RDW 14.4 14.5 14.4  PLT 327 330 332    Cardiac EnzymesNo results for input(s): TROPONINI in the last 168 hours. No results for input(s): TROPIPOC in the last 168 hours.   BNPNo results for input(s): BNP, PROBNP in the last 168 hours.   DDimer No results for input(s): DDIMER in the last 168 hours.   Radiology    Vas US Doppler Pre Cabg  Result Date: 11/18/2018 PREOPERATIVE VASCULAR EVALUATION  Risk Factors:     Hypertension, hyperlipidemia, Diabetes, coronary artery                   disease. Comparison Study: Prior study from 04/12/18 is available for comparison. No                    significant change noted. Performing Technologist: Sharion Dove RVS  Examination Guidelines: A complete evaluation includes B-mode imaging, spectral Doppler, color Doppler, and power Doppler as needed of all accessible portions of each vessel. Bilateral testing is considered an integral part of a complete examination. Limited examinations for reoccurring indications may be performed as noted.  Right Carotid Findings: +----------+--------+--------+--------+----------------------+--------+             PSV cm/s EDV cm/s Stenosis Describe               Comments  +----------+--------+--------+--------+----------------------+--------+  CCA Prox   81       19                                                 +----------+--------+--------+--------+----------------------+--------+  CCA Distal 78       17                                                 +----------+--------+--------+--------+----------------------+--------+  ICA Prox   135      34                calcific and irregular           +----------+--------+--------+--------+----------------------+--------+  ICA Distal 133      23                                                 +----------+--------+--------+--------+----------------------+--------+  ECA        315      9                                                  +----------+--------+--------+--------+----------------------+--------+  Portions of this table do not appear on this page. +----------+--------+-------+--------+------------+             PSV cm/s EDV cms Describe Arm Pressure  +----------+--------+-------+--------+------------+  Subclavian 151                                     +----------+--------+-------+--------+------------+ +---------+--------+--+--------+-+  Vertebral PSV cm/s 37 EDV cm/s 7  +---------+--------+--+--------+-+ Left Carotid Findings: +----------+--------+--------+--------+------------+--------+             PSV cm/s EDV cm/s Stenosis Describe     Comments   +----------+--------+--------+--------+------------+--------+  CCA Prox   87       11                                       +----------+--------+--------+--------+------------+--------+  CCA Distal 113      15                                       +----------+--------+--------+--------+------------+--------+  ICA Prox   120      19                heterogenous           +----------+--------+--------+--------+------------+--------+  ICA Distal 112      20                                       +----------+--------+--------+--------+------------+--------+  ECA        246      10                                       +----------+--------+--------+--------+------------+--------+ +----------+--------+--------+--------+------------+  Subclavian PSV cm/s EDV cm/s Describe Arm Pressure  +----------+--------+--------+--------+------------+             81                         155           +----------+--------+--------+--------+------------+ +---------+--------+---+--------+--+  Vertebral PSV cm/s 106 EDV cm/s 13  +---------+--------+---+--------+--+  ABI Findings: +--------+------------------+-----+--------+-------------------+  Right    Rt Pressure (mmHg) Index Waveform Comment              +--------+------------------+-----+--------+-------------------+  Brachial                                   restricted/dialysis  +--------+------------------+-----+--------+-------------------+  PTA      163                1.05  biphasic                      +--------+------------------+-----+--------+-------------------+  DP       130                0.84  biphasic                      +--------+------------------+-----+--------+-------------------+ +--------+------------------+-----+---------+-------+  Left  Lt Pressure (mmHg) Index Waveform  Comment  +--------+------------------+-----+---------+-------+  Brachial 155                      triphasic          +--------+------------------+-----+---------+-------+  PTA      158                 1.02  biphasic           +--------+------------------+-----+---------+-------+  DP       136                0.88  biphasic           +--------+------------------+-----+---------+-------+ +-------+---------------+----------------+  ABI/TBI Today's ABI/TBI Previous ABI/TBI  +-------+---------------+----------------+  Right   1.05                              +-------+---------------+----------------+  Left    1.02                              +-------+---------------+----------------+  Right Doppler Findings: +-----------+--------+-----+--------+------------------------------------------+  Site        Pressure Index Doppler  Comments                                    +-----------+--------+-----+--------+------------------------------------------+  Brachial                            restricted/dialysis                         +-----------+--------+-----+--------+------------------------------------------+  Radial                     biphasic                                             +-----------+--------+-----+--------+------------------------------------------+  Ulnar                      biphasic                                             +-----------+--------+-----+--------+------------------------------------------+  Palmar Arch                         Doppler signal reverses with radial                                              compression and remains normal with ulnar                                        compression                                 +-----------+--------+-----+--------+------------------------------------------+  Left Doppler Findings: +-----------+--------+-----+---------+--------+  Site        Pressure Index Doppler   Comments  +-----------+--------+-----+---------+--------+  Brachial    155            triphasic           +-----------+--------+-----+---------+--------+  Radial                     triphasic           +-----------+--------+-----+---------+--------+  Ulnar                       triphasic           +-----------+--------+-----+---------+--------+  Palmar Arch                          WNL       +-----------+--------+-----+---------+--------+  Summary: Right Carotid: Velocities in the right ICA are consistent with a 1-39% stenosis. Left Carotid: Velocities in the left ICA are consistent with a 1-39% stenosis. Vertebrals:  Bilateral vertebral arteries demonstrate antegrade flow. Subclavians: Normal flow hemodynamics were seen in bilateral subclavian              arteries. Right ABI: Resting right ankle-brachial index is within normal range. No evidence of significant right lower extremity arterial disease. Left ABI: Resting left ankle-brachial index is within normal range. No evidence of significant left lower extremity arterial disease. Right Upper Extremity: Doppler waveforms remain within normal limits with right radial compression. Doppler waveforms remain within normal limits with right ulnar compression. Left Upper Extremity: Doppler waveforms remain within normal limits with left radial compression. Doppler waveforms remain within normal limits with left ulnar compression.  Electronically signed by Deitra Mayo MD on 11/18/2018 at 11:45:20 AM.    Final     Telemetry    11/19/2018 NSR/SB HR in the 50-60 range - Personally Reviewed  ECG     11/12/2018 NSR with TWI in leads III, V1-V5- Personally Reviewed  Cardiac Studies   Echocardiogram 11/10/2018: Impressions: 1. The left ventricle has normal systolic function, with an ejection fraction of 55-60%. The cavity size was normal. There is moderately increased left ventricular wall thickness. Left ventricular diastolic Doppler parameters are consistent with  impaired relaxation. Elevated mean left atrial pressure. 2. The right ventricle has normal systolic function. The cavity was normal. There is no increase in right ventricular wall thickness. 3. Left atrial size was moderately dilated. 4. Right atrial  size was mildly dilated. 5. The aortic valve is tricuspid. Moderate thickening of the aortic valve. Moderate calcification of the aortic valve. No stenosis of the aortic valve. Moderate aortic annular calcification noted. 6. The mitral valve is abnormal. Moderate thickening of the mitral valve leaflet. Moderate calcification of the mitral valve leaflet. There is moderate mitral annular calcification present. No evidence of mitral valve stenosis. 7. The aortic root is normal in size and structure. 8. Pulmonary hypertension is mildly elevated, PASP is 34 mmHg. _______________  Left Heart Catheterization 11/12/2018:  Prox RCA lesion is 50% stenosed.  Mid RCA to Dist RCA lesion is 80% stenosed.  Dist RCA lesion is 100% stenosed.  Prox Cx lesion is 99% stenosed.  Mid Cx lesion is 99% stenosed.  Dist Cx lesion is 90% stenosed.  1st Mrg lesion is 90% stenosed.  Ost LAD to Prox LAD lesion is 70% stenosed.  Mid LAD lesion is 30% stenosed.  1. 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 Hold Plavix.  Patient Profile     78 y.o. 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 Troponin peaked at 4.14. Patient was started on hemodialysis due to uremia. Patient transferred to [facility] on 11/11/2018 for cardiac catheterization now that hemodialysis established.  Assessment & Plan    1. NSTEMI/CAD: -Pt with elevated troponin>>LHC 06/17  showed severe triple vessel CAD with chronic occlusion of the distal RCA. -Plan for CABG 6/25 after Plavix washout (last dose 6/17).  -Continue ASA, BB -Change metoprolol to Toprol XL at d/c  -Statin intolerance>>>PSCK9 clinic in the OP setting  -Denies recurrent chest pain, SOB  -Discussed post-surgical plan   2. Chronic combined CHF: -Echocardiogram with LVEF of 55-60% with G1DD -HD for fluid volume management  -Continue hydralazine and BB   3. ESRD: -New>>started 06/12  -HD MWF schedule  -Per nephrology   4. HTN: -Stable, 152/46>137/49>143/48 -Continue current medications with 37.5 BID, Metoprolol 25,   5. HLD: -LDL,  -Statin intolerance  -Will need PCSK9 clinic in the OP setting    Signed, Kathyrn Drown NP-C [facility] Pager: [phone] 11/19/2018, 7:15 AM     For questions or updates, please contact   Please consult [email or web address] for contact info under Cardiology/STEMI.  Personally seen and examined. Agree with above.   Non-STEMI -Plavix currently on hold/washout for CABG CAD Diastolic heart failure-appears well compensated-EF 60 End-stage renal disease-HD Hypertension-reasonable control Hyperlipidemia- multiple statin intolerances, PCSK9 in outpatient setting.  A bit anxious waiting for surgery.  Tomorrow.  CABG. Alert, oriented, comfortable breathing.  Candee Furbish, MD

## 2018-11-19 NOTE — Progress Notes (Addendum)
[facility] Progress Note   Assessment/ Plan:    1. Severe 3 vessel CAD--> plan is for CABG on 6/25 this week    2.ESRD: New start dialysis on 6/12.  16 g needles, BFR 300.  CLIP'd to [facility] 11:30.  HD today. Keep on MWF schedule for now. HD today. Labs good. Have reached dry wt.   3. 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 Nutrition: albumin 2.6, prostat  6. Hypertension: on metop, hydral  7.  Dispo: awaiting CABG  Kelly Splinter, MD 11/19/2018, 2:56 PM   Subjective:    Seen in room, no new c/o's.     Objective:   BP (!) 138/57 (BP Location: Left Arm)   Pulse 66   Temp (!) 97.2 F (36.2 C) (Oral)   Resp 19   Ht 5\' 4"  (1.626 m)   Wt 79.9 kg   SpO2 99%   BMI 30.24 kg/m   Physical Exam: Gen: older woman, NAD, sitting in chair CVS: RRR no m/r/g Resp: normal WOB, bibasilar breath sounds Abd: soft nontender NABS Ext: trace LE edema ACCESS: R UE AVF +T/B  Labs: BMET Recent Labs  Lab 11/13/18 0421 11/14/18 0448 11/17/18 0619 11/17/18 1336 11/19/18 0536 11/19/18 1320  NA 137  --   --  137  --  135  K 4.3  --   --  4.0  --  4.1  CL 100  --   --  102  --  100  CO2 26  --   --  21*  --  20*  GLUCOSE 98  --   --  151*  --  130*  BUN 22  --   --  85*  --  95*  CREATININE 2.36* 4.01* 5.71* 5.88* 5.54* 5.33*  CALCIUM 8.5*  --   --  8.5*  --  8.7*  PHOS  --   --   --  6.0*  --  5.8*   CBC Recent Labs  Lab 11/15/18 0508 11/16/18 0703 11/17/18 0619 11/18/18 0446 11/19/18 1315  WBC 7.3 6.2 6.1 7.5 6.8  NEUTROABS 4.5 3.6 3.6 4.9  --   HGB 9.0* 8.8* 9.1* 9.2* 9.4*  HCT 29.2* 28.3* 28.9* 28.9* 29.5*  MCV 96.1 95.6 96.0 94.4 94.9  PLT 332 327 330 332 328    @IMGRELPRIORS @ Medications:    . aspirin EC  81 mg Oral Daily  . calcium acetate  667 mg Oral TID WC  . Chlorhexidine Gluconate Cloth  6 each Topical Q0600  . ciprofloxacin-dexamethasone  4 drop Left EAR  BID  . darbepoetin (ARANESP) injection - DIALYSIS  100 mcg Intravenous Q Mon-HD  . docusate sodium  100 mg Oral Q12H  . febuxostat  40 mg Oral Daily  . feeding supplement (NEPRO CARB STEADY)  237 mL Oral Q24H  . gabapentin  300 mg Oral QHS  . [START ON 11/20/2018] heparin-papaverine-plasmalyte irrigation   Irrigation To OR  . heparin  5,000 Units Subcutaneous Q8H  . hydrALAZINE  37.5 mg Oral BID  . insulin aspart  0-5 Units Subcutaneous QHS  . insulin aspart  0-9 Units Subcutaneous TID WC  . [START ON 11/20/2018] insulin   Intravenous To OR  . [START ON 11/20/2018] magnesium sulfate  40 mEq Other To OR  . mouth rinse  15 mL Mouth Rinse BID  . metoprolol tartrate  25 mg Oral BID  .  pantoprazole  40 mg Oral QAC breakfast  . [START ON 11/20/2018] phenylephrine  30-200 mcg/min Intravenous To OR  . [START ON 11/20/2018] potassium chloride  80 mEq Other To OR  . sodium chloride flush  3 mL Intravenous Q12H  . thyroid  120 mg Oral Q0600  . [START ON 11/20/2018] tranexamic acid  15 mg/kg Intravenous To OR  . [START ON 11/20/2018] tranexamic acid  2 mg/kg Intracatheter To OR

## 2018-11-19 NOTE — Progress Notes (Signed)
CARDIAC REHAB PHASE I   Pt declining walk at this time due to upcoming dialysis. Pt states she is anxious for surgery. Reinforced pre-op ed, provided listening and encouraging ear. Will continue to follow throughout her hospital stay.  [id number] [name], RN BSN 11/19/2018 11:55 AM

## 2018-11-19 NOTE — Anesthesia Preprocedure Evaluation (Addendum)
Anesthesia Evaluation  Patient identified by MRN, date of birth, ID band Patient awake    Reviewed: Allergy & Precautions, NPO status , Patient's Chart, lab work & pertinent test results, reviewed documented beta blocker date and time   Airway Mallampati: II  TM Distance: >3 FB Neck ROM: Full    Dental  (+) Edentulous Upper, Missing,    Pulmonary neg pulmonary ROS,    Pulmonary exam normal breath sounds clear to auscultation       Cardiovascular hypertension, Pt. on medications + CAD, + Past MI and +CHF  Normal cardiovascular exam Rhythm:Regular Rate:Normal  CATH Prox RCA lesion is 50% stenosed. Mid RCA to Dist RCA lesion is 80% stenosed. Dist RCA lesion is 100% stenosed. Prox Cx lesion is 99% stenosed. Mid Cx lesion is 99% stenosed. Dist Cx lesion is 90% stenosed. 1st Mrg lesion is 90% stenosed. Ost LAD to Prox LAD lesion is 70% stenosed. Mid LAD lesion is 30% stenosed.   1. 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  ECHO:   1. The left ventricle has normal systolic function, with an ejection fraction of 55-60%. The cavity size was normal. There is moderately increased left ventricular wall thickness. Left ventricular diastolic Doppler parameters are consistent with  impaired relaxation. Elevated mean left atrial pressure.  2. The right ventricle has normal systolic function. The cavity was normal. There is no increase in right ventricular wall thickness.  3. Left atrial size was moderately dilated.  4. Right atrial size was mildly dilated.  5. The aortic valve is tricuspid. Moderate thickening of the aortic valve. Moderate calcification of the aortic valve. No stenosis of the aortic valve. Moderate aortic annular calcification noted.  6. The mitral valve is abnormal. Moderate thickening of the mitral valve leaflet. Moderate calcification of the mitral valve leaflet. There is moderate mitral annular  calcification present. No evidence of mitral valve stenosis.  7. The aortic root is normal in size and structure.  8. Pulmonary hypertension is mildly elevated, PASP is 34 mmHg.   Neuro/Psych negative neurological ROS  negative psych ROS   GI/Hepatic Neg liver ROS, GERD  Medicated and Controlled,  Endo/Other  diabetes, Insulin DependentHypothyroidism   Renal/GU ESRF and DialysisRenal disease     Musculoskeletal negative musculoskeletal ROS (+)   Abdominal   Peds  Hematology  (+) anemia , HLD   Anesthesia Other Findings CAD  Reproductive/Obstetrics                            Anesthesia Physical Anesthesia Plan  ASA: IV  Anesthesia Plan: General   Post-op Pain Management:    Induction: Intravenous  PONV Risk Score and Plan: 3 and Ondansetron, Dexamethasone, Midazolam and Treatment may vary due to age or medical condition  Airway Management Planned: Oral ETT  Additional Equipment: Arterial line, CVP, PA Cath, TEE and Ultrasound Guidance Line Placement  Intra-op Plan:   Post-operative Plan: Post-operative intubation/ventilation  Informed Consent: I have reviewed the patients History and Physical, chart, labs and discussed the procedure including the risks, benefits and alternatives for the proposed anesthesia with the patient or authorized representative who has indicated his/her understanding and acceptance.     Dental advisory given  Plan Discussed with: CRNA  Anesthesia Plan Comments:        Anesthesia Quick Evaluation

## 2018-11-20 ENCOUNTER — Inpatient Hospital Stay (HOSPITAL_COMMUNITY): Payer: Medicare HMO

## 2018-11-20 ENCOUNTER — Inpatient Hospital Stay (HOSPITAL_COMMUNITY)
Admission: EM | Disposition: A | Discharge status: Home Health | Payer: Self-pay | Source: Home / Self Care | Attending: Thoracic Surgery (Cardiothoracic Vascular Surgery)

## 2018-11-20 ENCOUNTER — Inpatient Hospital Stay (HOSPITAL_COMMUNITY): Payer: Medicare HMO | Admitting: Anesthesiology

## 2018-11-20 DIAGNOSIS — Z951 Presence of aortocoronary bypass graft: Secondary | ICD-10-CM

## 2018-11-20 DIAGNOSIS — I2510 Atherosclerotic heart disease of native coronary artery without angina pectoris: Secondary | ICD-10-CM

## 2018-11-20 HISTORY — PX: TEE WITHOUT CARDIOVERSION: SHX5443

## 2018-11-20 HISTORY — PX: CORONARY ARTERY BYPASS GRAFT: SHX141

## 2018-11-20 LAB — CBC
HCT: 28.3 % — ABNORMAL LOW (ref 36.0–46.0)
HCT: 29.8 % — ABNORMAL LOW (ref 36.0–46.0)
HCT: 31.1 % — ABNORMAL LOW (ref 36.0–46.0)
Hemoglobin: 8.9 g/dL — ABNORMAL LOW (ref 12.0–15.0)
Hemoglobin: 9.6 g/dL — ABNORMAL LOW (ref 12.0–15.0)
Hemoglobin: 10 g/dL — ABNORMAL LOW (ref 12.0–15.0)
MCH: 29.7 pg (ref 26.0–34.0)
MCH: 29.3 pg (ref 26.0–34.0)
MCH: 29.6 pg (ref 26.0–34.0)
MCHC: 31.4 g/dL (ref 30.0–36.0)
MCHC: 32.2 g/dL (ref 30.0–36.0)
MCHC: 32.2 g/dL (ref 30.0–36.0)
MCV: 94.3 fL (ref 80.0–100.0)
MCV: 90.9 fL (ref 80.0–100.0)
MCV: 92 fL (ref 80.0–100.0)
Platelets: 294 10*3/uL (ref 150–400)
Platelets: 190 10*3/uL (ref 150–400)
Platelets: 236 10*3/uL (ref 150–400)
RBC: 3 MIL/uL — ABNORMAL LOW (ref 3.87–5.11)
RBC: 3.28 MIL/uL — ABNORMAL LOW (ref 3.87–5.11)
RBC: 3.38 MIL/uL — ABNORMAL LOW (ref 3.87–5.11)
RDW: 14.5 % (ref 11.5–15.5)
RDW: 15.9 % — ABNORMAL HIGH (ref 11.5–15.5)
RDW: 17.2 % — ABNORMAL HIGH (ref 11.5–15.5)
WBC: 6.7 10*3/uL (ref 4.0–10.5)
WBC: 12.8 10*3/uL — ABNORMAL HIGH (ref 4.0–10.5)
WBC: 16.3 10*3/uL — ABNORMAL HIGH (ref 4.0–10.5)
nRBC: 0 % (ref 0.0–0.2)
nRBC: 0 % (ref 0.0–0.2)
nRBC: 0 % (ref 0.0–0.2)

## 2018-11-20 LAB — BLOOD GAS, ARTERIAL
Acid-Base Excess: 3.7 mmol/L — ABNORMAL HIGH (ref 0.0–2.0)
Bicarbonate: 27.7 mmol/L (ref 20.0–28.0)
Drawn by: 347191
FIO2: 0.21
O2 Saturation: 94.4 %
Patient temperature: 98.2
pCO2 arterial: 41.1 mmHg (ref 32.0–48.0)
pH, Arterial: 7.441 (ref 7.350–7.450)
pO2, Arterial: 70.7 mmHg — ABNORMAL LOW (ref 83.0–108.0)

## 2018-11-20 LAB — GLUCOSE, CAPILLARY
Glucose-Capillary: 120 mg/dL — ABNORMAL HIGH (ref 70–99)
Glucose-Capillary: 128 mg/dL — ABNORMAL HIGH (ref 70–99)
Glucose-Capillary: 113 mg/dL — ABNORMAL HIGH (ref 70–99)
Glucose-Capillary: 107 mg/dL — ABNORMAL HIGH (ref 70–99)
Glucose-Capillary: 94 mg/dL (ref 70–99)
Glucose-Capillary: 106 mg/dL — ABNORMAL HIGH (ref 70–99)
Glucose-Capillary: 112 mg/dL — ABNORMAL HIGH (ref 70–99)

## 2018-11-20 LAB — ECHO INTRAOPERATIVE TEE
Height: 64 in
Weight: 2670.21 oz

## 2018-11-20 LAB — PREPARE RBC (CROSSMATCH)

## 2018-11-20 LAB — PLATELET COUNT: Platelets: 189 10*3/uL (ref 150–400)

## 2018-11-20 LAB — BASIC METABOLIC PANEL
Anion gap: 14 (ref 5–15)
BUN: 46 mg/dL — ABNORMAL HIGH (ref 8–23)
CO2: 26 mmol/L (ref 22–32)
Calcium: 8.8 mg/dL — ABNORMAL LOW (ref 8.9–10.3)
Chloride: 97 mmol/L — ABNORMAL LOW (ref 98–111)
Creatinine, Ser: 4.01 mg/dL — ABNORMAL HIGH (ref 0.44–1.00)
GFR calc Af Amer: 12 mL/min — ABNORMAL LOW (ref >60)
GFR calc non Af Amer: 10 mL/min — ABNORMAL LOW (ref >60)
Glucose, Bld: 117 mg/dL — ABNORMAL HIGH (ref 70–99)
Potassium: 3.5 mmol/L (ref 3.5–5.1)
Sodium: 137 mmol/L (ref 135–145)

## 2018-11-20 LAB — LIPID PANEL
Cholesterol: 336 mg/dL — ABNORMAL HIGH (ref 0–200)
HDL: 40 mg/dL — ABNORMAL LOW (ref >40)
LDL Cholesterol: 221 mg/dL — ABNORMAL HIGH (ref 0–99)
Total CHOL/HDL Ratio: 8.4 RATIO
Triglycerides: 373 mg/dL — ABNORMAL HIGH (ref <150)
VLDL: 75 mg/dL — ABNORMAL HIGH (ref 0–40)

## 2018-11-20 LAB — CREATININE, SERUM
Creatinine, Ser: 3.84 mg/dL — ABNORMAL HIGH (ref 0.44–1.00)
GFR calc Af Amer: 12 mL/min — ABNORMAL LOW (ref >60)
GFR calc non Af Amer: 11 mL/min — ABNORMAL LOW (ref >60)

## 2018-11-20 LAB — PROTIME-INR
INR: 1.4 — ABNORMAL HIGH (ref 0.8–1.2)
Prothrombin Time: 17.1 seconds — ABNORMAL HIGH (ref 11.4–15.2)

## 2018-11-20 LAB — HEMOGLOBIN A1C
Hgb A1c MFr Bld: 5.2 % (ref 4.8–5.6)
Mean Plasma Glucose: 102.54 mg/dL

## 2018-11-20 LAB — APTT
aPTT: 28 seconds (ref 24–36)
aPTT: 34 seconds (ref 24–36)

## 2018-11-20 LAB — MAGNESIUM
Magnesium: 2.1 mg/dL (ref 1.7–2.4)
Magnesium: 2.3 mg/dL (ref 1.7–2.4)

## 2018-11-20 LAB — HEMOGLOBIN AND HEMATOCRIT, BLOOD
HCT: 23.9 % — ABNORMAL LOW (ref 36.0–46.0)
Hemoglobin: 7.8 g/dL — ABNORMAL LOW (ref 12.0–15.0)

## 2018-11-20 SURGERY — CORONARY ARTERY BYPASS GRAFTING (CABG)
Anesthesia: General | Site: Chest

## 2018-11-20 MED ORDER — ACETAMINOPHEN 160 MG/5ML PO SOLN: 1000.0000 mg | Freq: Four times a day (QID) | ORAL | Status: AC

## 2018-11-20 MED ORDER — SODIUM CHLORIDE (PF) 0.9 % IJ SOLN
OROMUCOSAL | Status: DC | PRN
  Administered 2018-11-20 (×3): 4 mL via TOPICAL

## 2018-11-20 MED ORDER — INSULIN REGULAR BOLUS VIA INFUSION
0.0000 [IU] | Freq: Three times a day (TID) | INTRAVENOUS | Status: DC
  Filled 2018-11-20: qty 10

## 2018-11-20 MED ORDER — SODIUM CHLORIDE 0.9 % IV SOLN: INTRAVENOUS | Status: DC | PRN

## 2018-11-20 MED ORDER — INSULIN REGULAR(HUMAN) IN NACL 100-0.9 UT/100ML-% IV SOLN: INTRAVENOUS | Status: DC

## 2018-11-20 MED ORDER — FENTANYL CITRATE (PF) 250 MCG/5ML IJ SOLN
INTRAMUSCULAR | Status: DC | PRN
  Administered 2018-11-20: 50 ug via INTRAVENOUS
  Administered 2018-11-20: 250 ug via INTRAVENOUS
  Administered 2018-11-20: 700 ug via INTRAVENOUS
  Administered 2018-11-20: 250 ug via INTRAVENOUS

## 2018-11-20 MED ORDER — ACETAMINOPHEN 650 MG RE SUPP: 650.0000 mg | Freq: Once | RECTAL | Status: AC

## 2018-11-20 MED ORDER — DOCUSATE SODIUM 100 MG PO CAPS
200.0000 mg | ORAL_CAPSULE | Freq: Every day | ORAL | Status: DC
  Administered 2018-11-21 – 2018-11-30 (×9): 200 mg via ORAL
  Filled 2018-11-20 (×9): qty 2

## 2018-11-20 MED ORDER — SODIUM CHLORIDE 0.9% FLUSH: 10.0000 mL | INTRAVENOUS | Status: DC | PRN

## 2018-11-20 MED ORDER — SODIUM CHLORIDE 0.9% FLUSH: 3.0000 mL | INTRAVENOUS | Status: DC | PRN

## 2018-11-20 MED ORDER — SODIUM CHLORIDE 0.9 % IV SOLN
INTRAVENOUS | Status: DC | PRN
  Administered 2018-11-20: .75 mg via INTRAVENOUS

## 2018-11-20 MED ORDER — CHLORHEXIDINE GLUCONATE CLOTH 2 % EX PADS
6.0000 | MEDICATED_PAD | Freq: Every day | CUTANEOUS | Status: DC
  Administered 2018-11-21: 6 via TOPICAL

## 2018-11-20 MED ORDER — POTASSIUM CHLORIDE 10 MEQ/50ML IV SOLN: 10.0000 meq | INTRAVENOUS | Status: AC

## 2018-11-20 MED ORDER — CHLORHEXIDINE GLUCONATE 0.12 % MT SOLN
15.0000 mL | OROMUCOSAL | Status: AC
  Administered 2018-11-20: 15 mL via OROMUCOSAL

## 2018-11-20 MED ORDER — FENTANYL CITRATE (PF) 250 MCG/5ML IJ SOLN
INTRAMUSCULAR | Status: AC
  Filled 2018-11-20: qty 20

## 2018-11-20 MED ORDER — SODIUM CHLORIDE 0.9 % IV SOLN
INTRAVENOUS | Status: DC | PRN
  Administered 2018-11-20: 25 ug/min via INTRAVENOUS

## 2018-11-20 MED ORDER — PROPOFOL 10 MG/ML IV BOLUS
INTRAVENOUS | Status: DC | PRN
  Administered 2018-11-20: 20 mg via INTRAVENOUS

## 2018-11-20 MED ORDER — HEMOSTATIC AGENTS (NO CHARGE) OPTIME
TOPICAL | Status: DC | PRN
  Administered 2018-11-20: 1 via TOPICAL

## 2018-11-20 MED ORDER — HEPARIN SODIUM (PORCINE) 1000 UNIT/ML IJ SOLN
INTRAMUSCULAR | Status: DC | PRN
  Administered 2018-11-20: 2000 [IU] via INTRAVENOUS
  Administered 2018-11-20: 25000 [IU] via INTRAVENOUS

## 2018-11-20 MED ORDER — SODIUM CHLORIDE 0.9 % IV SOLN
INTRAVENOUS | Status: DC | PRN
  Administered 2018-11-20: 1.3 [IU]/h via INTRAVENOUS

## 2018-11-20 MED ORDER — LACTATED RINGERS IV SOLN
INTRAVENOUS | Status: DC
  Administered 2018-11-21: 11:00:00 via INTRAVENOUS

## 2018-11-20 MED ORDER — METOPROLOL TARTRATE 5 MG/5ML IV SOLN
2.5000 mg | INTRAVENOUS | Status: DC | PRN
  Administered 2018-11-21: 5 mg via INTRAVENOUS
  Administered 2018-11-28: 2.5 mg via INTRAVENOUS
  Filled 2018-11-20: qty 5

## 2018-11-20 MED ORDER — NITROGLYCERIN IN D5W 200-5 MCG/ML-% IV SOLN: 0.0000 ug/min | INTRAVENOUS | Status: DC

## 2018-11-20 MED ORDER — SODIUM CHLORIDE 0.9 % IV SOLN
1.5000 g | INTRAVENOUS | Status: AC
  Administered 2018-11-20 – 2018-11-21 (×2): 1.5 g via INTRAVENOUS
  Filled 2018-11-20 (×2): qty 1.5

## 2018-11-20 MED ORDER — SODIUM CHLORIDE 0.9 % IV SOLN: 250.0000 mL | INTRAVENOUS | Status: DC

## 2018-11-20 MED ORDER — METOPROLOL TARTRATE 25 MG/10 ML ORAL SUSPENSION
12.5000 mg | Freq: Two times a day (BID) | ORAL | Status: DC
  Filled 2018-11-20: qty 5

## 2018-11-20 MED ORDER — LACTATED RINGERS IV SOLN: 500.0000 mL | Freq: Once | INTRAVENOUS | Status: DC | PRN

## 2018-11-20 MED ORDER — ASPIRIN EC 325 MG PO TBEC: 325.0000 mg | DELAYED_RELEASE_TABLET | Freq: Every day | ORAL | Status: DC

## 2018-11-20 MED ORDER — SODIUM CHLORIDE 0.9% FLUSH
3.0000 mL | Freq: Two times a day (BID) | INTRAVENOUS | Status: DC
  Administered 2018-11-21 – 2018-11-29 (×15): 3 mL via INTRAVENOUS

## 2018-11-20 MED ORDER — FENTANYL CITRATE (PF) 250 MCG/5ML IJ SOLN
INTRAMUSCULAR | Status: AC
  Filled 2018-11-20: qty 5

## 2018-11-20 MED ORDER — MIDAZOLAM HCL (PF) 10 MG/2ML IJ SOLN
INTRAMUSCULAR | Status: AC
  Filled 2018-11-20: qty 2

## 2018-11-20 MED ORDER — SODIUM CHLORIDE 0.9 % IV SOLN: INTRAVENOUS | Status: DC

## 2018-11-20 MED ORDER — PANTOPRAZOLE SODIUM 40 MG PO TBEC
40.0000 mg | DELAYED_RELEASE_TABLET | Freq: Every day | ORAL | Status: DC
  Administered 2018-11-22 – 2018-11-30 (×10): 40 mg via ORAL
  Filled 2018-11-20 (×10): qty 1

## 2018-11-20 MED ORDER — SODIUM CHLORIDE 0.9 % IV SOLN
1.5000 g | Freq: Two times a day (BID) | INTRAVENOUS | Status: DC
  Filled 2018-11-20 (×2): qty 1.5

## 2018-11-20 MED ORDER — ROCURONIUM BROMIDE 10 MG/ML (PF) SYRINGE
PREFILLED_SYRINGE | INTRAVENOUS | Status: DC | PRN
  Administered 2018-11-20: 50 mg via INTRAVENOUS
  Administered 2018-11-20: 100 mg via INTRAVENOUS
  Administered 2018-11-20: 10 mg via INTRAVENOUS

## 2018-11-20 MED ORDER — OXYCODONE HCL 5 MG PO TABS
5.0000 mg | ORAL_TABLET | ORAL | Status: DC | PRN
  Administered 2018-11-21 – 2018-11-23 (×3): 10 mg via ORAL
  Filled 2018-11-20 (×3): qty 2

## 2018-11-20 MED ORDER — CHLORHEXIDINE GLUCONATE 0.12% ORAL RINSE (MEDLINE KIT)
15.0000 mL | Freq: Two times a day (BID) | OROMUCOSAL | Status: DC
  Administered 2018-11-20: 15 mL via OROMUCOSAL

## 2018-11-20 MED ORDER — LACTATED RINGERS IV SOLN: INTRAVENOUS | Status: DC

## 2018-11-20 MED ORDER — SODIUM CHLORIDE (PF) 0.9 % IJ SOLN
INTRAMUSCULAR | Status: AC
  Filled 2018-11-20: qty 10

## 2018-11-20 MED ORDER — ROCURONIUM BROMIDE 10 MG/ML (PF) SYRINGE
PREFILLED_SYRINGE | INTRAVENOUS | Status: AC
  Filled 2018-11-20: qty 20

## 2018-11-20 MED ORDER — MORPHINE SULFATE (PF) 2 MG/ML IV SOLN
1.0000 mg | INTRAVENOUS | Status: DC | PRN
  Administered 2018-11-20: 1 mg via INTRAVENOUS
  Administered 2018-11-21: 11:00:00 2 mg via INTRAVENOUS
  Filled 2018-11-20 (×2): qty 1

## 2018-11-20 MED ORDER — DEXMEDETOMIDINE HCL IN NACL 200 MCG/50ML IV SOLN
0.0000 ug/kg/h | INTRAVENOUS | Status: DC
  Filled 2018-11-20: qty 50

## 2018-11-20 MED ORDER — CHLORHEXIDINE GLUCONATE CLOTH 2 % EX PADS
6.0000 | MEDICATED_PAD | Freq: Every day | CUTANEOUS | Status: DC
  Administered 2018-11-20: 6 via TOPICAL

## 2018-11-20 MED ORDER — SODIUM CHLORIDE 0.9 % IV SOLN
INTRAVENOUS | Status: DC | PRN
  Administered 2018-11-20: 50 ug/min via INTRAVENOUS

## 2018-11-20 MED ORDER — VANCOMYCIN HCL IN DEXTROSE 1-5 GM/200ML-% IV SOLN
1000.0000 mg | Freq: Once | INTRAVENOUS | Status: AC
  Administered 2018-11-20: 1000 mg via INTRAVENOUS
  Filled 2018-11-20: qty 200

## 2018-11-20 MED ORDER — FAMOTIDINE IN NACL 20-0.9 MG/50ML-% IV SOLN
20.0000 mg | Freq: Two times a day (BID) | INTRAVENOUS | Status: AC
  Administered 2018-11-20: 20 mg via INTRAVENOUS
  Filled 2018-11-20: qty 50

## 2018-11-20 MED ORDER — BISACODYL 5 MG PO TBEC
10.0000 mg | DELAYED_RELEASE_TABLET | Freq: Every day | ORAL | Status: DC
  Administered 2018-11-21 – 2018-11-30 (×6): 10 mg via ORAL
  Filled 2018-11-20 (×6): qty 2

## 2018-11-20 MED ORDER — ACETAMINOPHEN 500 MG PO TABS
1000.0000 mg | ORAL_TABLET | Freq: Four times a day (QID) | ORAL | Status: AC
  Administered 2018-11-20 – 2018-11-25 (×19): 1000 mg via ORAL
  Filled 2018-11-20 (×20): qty 2

## 2018-11-20 MED ORDER — ORAL CARE MOUTH RINSE
15.0000 mL | OROMUCOSAL | Status: DC
  Administered 2018-11-20 – 2018-11-21 (×5): 15 mL via OROMUCOSAL

## 2018-11-20 MED ORDER — PROTAMINE SULFATE 10 MG/ML IV SOLN
INTRAVENOUS | Status: DC | PRN
  Administered 2018-11-20 (×4): 50 mg via INTRAVENOUS
  Administered 2018-11-20: 60 mg via INTRAVENOUS
  Administered 2018-11-20: 10 mg via INTRAVENOUS

## 2018-11-20 MED ORDER — 0.9 % SODIUM CHLORIDE (POUR BTL) OPTIME
TOPICAL | Status: DC | PRN
  Administered 2018-11-20: 6000 mL

## 2018-11-20 MED ORDER — PROTAMINE SULFATE 10 MG/ML IV SOLN
INTRAVENOUS | Status: AC
  Filled 2018-11-20: qty 25

## 2018-11-20 MED ORDER — ALBUMIN HUMAN 5 % IV SOLN
250.0000 mL | INTRAVENOUS | Status: AC | PRN
  Administered 2018-11-20: 12.5 g via INTRAVENOUS

## 2018-11-20 MED ORDER — DEXMEDETOMIDINE HCL IN NACL 400 MCG/100ML IV SOLN
INTRAVENOUS | Status: DC | PRN
  Administered 2018-11-20: 0.7 ug/kg/h via INTRAVENOUS

## 2018-11-20 MED ORDER — TRAMADOL HCL 50 MG PO TABS
50.0000 mg | ORAL_TABLET | ORAL | Status: DC | PRN
  Administered 2018-11-21: 50 mg via ORAL
  Filled 2018-11-20 (×2): qty 1

## 2018-11-20 MED ORDER — SODIUM CHLORIDE 0.45 % IV SOLN: INTRAVENOUS | Status: DC | PRN

## 2018-11-20 MED ORDER — PHENYLEPHRINE HCL-NACL 20-0.9 MG/250ML-% IV SOLN: 0.0000 ug/min | INTRAVENOUS | Status: DC

## 2018-11-20 MED ORDER — TRANEXAMIC ACID 1000 MG/10ML IV SOLN
INTRAVENOUS | Status: DC | PRN
  Administered 2018-11-20: .75 mg/kg/h via INTRAVENOUS

## 2018-11-20 MED ORDER — ACETAMINOPHEN 160 MG/5ML PO SOLN
650.0000 mg | Freq: Once | ORAL | Status: AC
  Administered 2018-11-20: 16:00:00 650 mg

## 2018-11-20 MED ORDER — ASPIRIN 81 MG PO CHEW: 324.0000 mg | CHEWABLE_TABLET | Freq: Every day | ORAL | Status: DC

## 2018-11-20 MED ORDER — METOPROLOL TARTRATE 12.5 MG HALF TABLET
12.5000 mg | ORAL_TABLET | Freq: Two times a day (BID) | ORAL | Status: DC
  Administered 2018-11-21 – 2018-11-24 (×6): 12.5 mg via ORAL
  Filled 2018-11-20 (×6): qty 1

## 2018-11-20 MED ORDER — MIDAZOLAM HCL (PF) 5 MG/ML IJ SOLN
INTRAMUSCULAR | Status: DC | PRN
  Administered 2018-11-20 (×2): 2 mg via INTRAVENOUS
  Administered 2018-11-20 (×2): 1 mg via INTRAVENOUS

## 2018-11-20 MED ORDER — MIDAZOLAM HCL 2 MG/2ML IJ SOLN: 2.0000 mg | INTRAMUSCULAR | Status: DC | PRN

## 2018-11-20 MED ORDER — PROPOFOL 10 MG/ML IV BOLUS
INTRAVENOUS | Status: AC
  Filled 2018-11-20: qty 20

## 2018-11-20 MED ORDER — SODIUM CHLORIDE 0.9% FLUSH
10.0000 mL | Freq: Two times a day (BID) | INTRAVENOUS | Status: DC
  Administered 2018-11-20 – 2018-11-23 (×7): 10 mL
  Administered 2018-11-23: 20 mL
  Administered 2018-11-24 (×2): 10 mL

## 2018-11-20 MED ORDER — BISACODYL 10 MG RE SUPP: 10.0000 mg | Freq: Every day | RECTAL | Status: DC

## 2018-11-20 MED ORDER — ALBUMIN HUMAN 5 % IV SOLN
INTRAVENOUS | Status: DC | PRN
  Administered 2018-11-20: 12:00:00 via INTRAVENOUS

## 2018-11-20 MED ORDER — HEPARIN SODIUM (PORCINE) 1000 UNIT/ML IJ SOLN
INTRAMUSCULAR | Status: AC
  Filled 2018-11-20: qty 1

## 2018-11-20 MED ORDER — ONDANSETRON HCL 4 MG/2ML IJ SOLN
4.0000 mg | Freq: Four times a day (QID) | INTRAMUSCULAR | Status: DC | PRN
  Administered 2018-11-23 – 2018-11-28 (×4): 4 mg via INTRAVENOUS
  Filled 2018-11-20 (×4): qty 2

## 2018-11-20 MED ORDER — MAGNESIUM SULFATE 4 GM/100ML IV SOLN
4.0000 g | Freq: Once | INTRAVENOUS | Status: DC
  Filled 2018-11-20: qty 100

## 2018-11-20 SURGICAL SUPPLY — 88 items
BAG DECANTER FOR FLEXI CONT (MISCELLANEOUS) ×4 IMPLANT
BANDAGE ACE 4X5 VEL STRL LF (GAUZE/BANDAGES/DRESSINGS) ×4 IMPLANT
BANDAGE ACE 6X5 VEL STRL LF (GAUZE/BANDAGES/DRESSINGS) ×4 IMPLANT
BASKET HEART  (ORDER IN 25'S) (MISCELLANEOUS) ×1
BASKET HEART (ORDER IN 25'S) (MISCELLANEOUS) ×1
BASKET HEART (ORDER IN 25S) (MISCELLANEOUS) ×2 IMPLANT
BLADE STERNUM SYSTEM 6 (BLADE) ×4 IMPLANT
BNDG ELASTIC 6X15 VLCR STRL LF (GAUZE/BANDAGES/DRESSINGS) ×2 IMPLANT
BNDG GAUZE ELAST 4 BULKY (GAUZE/BANDAGES/DRESSINGS) ×4 IMPLANT
CANISTER SUCT 3000ML PPV (MISCELLANEOUS) ×4 IMPLANT
CANNULA EZ GLIDE AORTIC 21FR (CANNULA) ×4 IMPLANT
CATH CPB KIT HENDRICKSON (MISCELLANEOUS) ×4 IMPLANT
CATH ROBINSON RED A/P 18FR (CATHETERS) ×4 IMPLANT
CATH THORACIC 36FR (CATHETERS) ×4 IMPLANT
CATH THORACIC 36FR RT ANG (CATHETERS) ×4 IMPLANT
CLIP VESOCCLUDE MED 24/CT (CLIP) IMPLANT
CLIP VESOCCLUDE SM WIDE 24/CT (CLIP) ×6 IMPLANT
COVER WAND RF STERILE (DRAPES) ×2 IMPLANT
CRADLE DONUT ADULT HEAD (MISCELLANEOUS) ×4 IMPLANT
DERMABOND ADVANCED (GAUZE/BANDAGES/DRESSINGS) ×2
DERMABOND ADVANCED .7 DNX12 (GAUZE/BANDAGES/DRESSINGS) IMPLANT
DRAPE CARDIOVASCULAR INCISE (DRAPES) ×2
DRAPE SLUSH/WARMER DISC (DRAPES) ×4 IMPLANT
DRAPE SRG 135X102X78XABS (DRAPES) ×2 IMPLANT
DRSG AQUACEL AG ADV 3.5X14 (GAUZE/BANDAGES/DRESSINGS) ×2 IMPLANT
DRSG COVADERM 4X14 (GAUZE/BANDAGES/DRESSINGS) ×4 IMPLANT
DRSG COVADERM 4X8 (GAUZE/BANDAGES/DRESSINGS) ×2 IMPLANT
ELECT REM PT RETURN 9FT ADLT (ELECTROSURGICAL) ×8
ELECTRODE REM PT RTRN 9FT ADLT (ELECTROSURGICAL) ×4 IMPLANT
FELT TEFLON 1X6 (MISCELLANEOUS) ×6 IMPLANT
GAUZE SPONGE 4X4 12PLY STRL (GAUZE/BANDAGES/DRESSINGS) ×8 IMPLANT
GAUZE SPONGE 4X4 12PLY STRL LF (GAUZE/BANDAGES/DRESSINGS) ×4 IMPLANT
GLOVE SURG SIGNA 7.5 PF LTX (GLOVE) ×12 IMPLANT
GOWN STRL REUS W/ TWL LRG LVL3 (GOWN DISPOSABLE) ×8 IMPLANT
GOWN STRL REUS W/ TWL XL LVL3 (GOWN DISPOSABLE) ×4 IMPLANT
GOWN STRL REUS W/TWL LRG LVL3 (GOWN DISPOSABLE) ×12
GOWN STRL REUS W/TWL XL LVL3 (GOWN DISPOSABLE) ×4
HEMOSTAT POWDER SURGIFOAM 1G (HEMOSTASIS) ×12 IMPLANT
HEMOSTAT SURGICEL 2X14 (HEMOSTASIS) ×4 IMPLANT
INSERT FOGARTY XLG (MISCELLANEOUS) IMPLANT
KIT BASIN OR (CUSTOM PROCEDURE TRAY) ×4 IMPLANT
KIT SUCTION CATH 14FR (SUCTIONS) ×8 IMPLANT
KIT TURNOVER KIT B (KITS) ×4 IMPLANT
KIT VASOVIEW HEMOPRO 2 VH 4000 (KITS) ×4 IMPLANT
MARKER GRAFT CORONARY BYPASS (MISCELLANEOUS) ×12 IMPLANT
NS IRRIG 1000ML POUR BTL (IV SOLUTION) ×22 IMPLANT
PACK E OPEN HEART (SUTURE) ×4 IMPLANT
PACK OPEN HEART (CUSTOM PROCEDURE TRAY) ×4 IMPLANT
PAD ARMBOARD 7.5X6 YLW CONV (MISCELLANEOUS) ×8 IMPLANT
PAD ELECT DEFIB RADIOL ZOLL (MISCELLANEOUS) ×4 IMPLANT
PENCIL BUTTON HOLSTER BLD 10FT (ELECTRODE) ×4 IMPLANT
PUNCH AORTIC ROTATE  4.5MM 8IN (MISCELLANEOUS) ×2 IMPLANT
PUNCH AORTIC ROTATE 4.0MM (MISCELLANEOUS) IMPLANT
PUNCH AORTIC ROTATE 4.5MM 8IN (MISCELLANEOUS) IMPLANT
PUNCH AORTIC ROTATE 5MM 8IN (MISCELLANEOUS) IMPLANT
SET CARDIOPLEGIA MPS 5001102 (MISCELLANEOUS) ×2 IMPLANT
SUT BONE WAX W31G (SUTURE) ×4 IMPLANT
SUT ETHIBOND 2 0 SH (SUTURE) ×2
SUT ETHIBOND 2 0 SH 36X2 (SUTURE) IMPLANT
SUT MNCRL AB 4-0 PS2 18 (SUTURE) IMPLANT
SUT PROLENE 3 0 SH DA (SUTURE) ×4 IMPLANT
SUT PROLENE 4 0 RB 1 (SUTURE) ×10
SUT PROLENE 4 0 SH DA (SUTURE) IMPLANT
SUT PROLENE 4-0 RB1 .5 CRCL 36 (SUTURE) IMPLANT
SUT PROLENE 6 0 C 1 30 (SUTURE) ×12 IMPLANT
SUT PROLENE 7 0 BV 1 (SUTURE) ×2 IMPLANT
SUT PROLENE 7 0 BV1 MDA (SUTURE) ×6 IMPLANT
SUT PROLENE 8 0 BV175 6 (SUTURE) IMPLANT
SUT STEEL 6MS V (SUTURE) ×4 IMPLANT
SUT STEEL STERNAL CCS#1 18IN (SUTURE) IMPLANT
SUT STEEL SZ 6 DBL 3X14 BALL (SUTURE) ×4 IMPLANT
SUT VIC AB 1 CTX 36 (SUTURE) ×6
SUT VIC AB 1 CTX36XBRD ANBCTR (SUTURE) ×4 IMPLANT
SUT VIC AB 2-0 CT1 27 (SUTURE) ×4
SUT VIC AB 2-0 CT1 TAPERPNT 27 (SUTURE) IMPLANT
SUT VIC AB 2-0 CTX 27 (SUTURE) IMPLANT
SUT VIC AB 3-0 SH 27 (SUTURE)
SUT VIC AB 3-0 SH 27X BRD (SUTURE) IMPLANT
SUT VIC AB 3-0 X1 27 (SUTURE) ×4 IMPLANT
SUT VICRYL 4-0 PS2 18IN ABS (SUTURE) IMPLANT
SYSTEM SAHARA CHEST DRAIN ATS (WOUND CARE) ×4 IMPLANT
TAPE CLOTH SURG 4X10 WHT LF (GAUZE/BANDAGES/DRESSINGS) ×2 IMPLANT
TOWEL GREEN STERILE (TOWEL DISPOSABLE) ×4 IMPLANT
TOWEL GREEN STERILE FF (TOWEL DISPOSABLE) ×4 IMPLANT
TRAY FOLEY SLVR 16FR TEMP STAT (SET/KITS/TRAYS/PACK) ×4 IMPLANT
TUBING LAP HI FLOW INSUFFLATIO (TUBING) ×4 IMPLANT
UNDERPAD 30X30 (UNDERPADS AND DIAPERS) ×4 IMPLANT
WATER STERILE IRR 1000ML POUR (IV SOLUTION) ×8 IMPLANT

## 2018-11-20 NOTE — Anesthesia Postprocedure Evaluation (Signed)
Anesthesia Post Note  Patient: [name]  Procedure(s) Performed: CORONARY ARTERY BYPASS GRAFTING (CABG), ON PUMP, TIMES four, USING LEFT INTERNAL MAMMARY ARTERY AND ENDOSCOPICALLY HARVESTED RIGHT GREATER SAPHENOUS VEIN (N/A Chest) TRANSESOPHAGEAL ECHOCARDIOGRAM (TEE) (N/A )     Patient location during evaluation: SICU Anesthesia Type: General Level of consciousness: sedated Pain management: pain level controlled Vital Signs Assessment: post-procedure vital signs reviewed and stable Respiratory status: patient remains intubated per anesthesia plan Cardiovascular status: stable Postop Assessment: no apparent nausea or vomiting Anesthetic complications: no    Last Vitals:  Vitals:   11/20/18 2122 11/20/18 2130  BP:    Pulse:  79  Resp:  13  Temp:  37.7 C  SpO2: 99%     Last Pain:  Vitals:   11/20/18 1958  TempSrc: Core  PainSc:                  Karyl Kinnier Ellender

## 2018-11-20 NOTE — Anesthesia Procedure Notes (Signed)
Arterial Line Insertion Start/End6/25/2020 7:00 AM, 11/20/2018 7:05 AM Performed by: CRNA  Preanesthetic checklist: patient identified, IV checked, site marked, risks and benefits discussed, surgical consent, monitors and equipment checked, pre-op evaluation, timeout performed and anesthesia consent Lidocaine 1% used for infiltration Left, radial was placed Catheter size: 20 G Hand hygiene performed  and maximum sterile barriers used   Attempts: 1 Procedure performed without using ultrasound guided technique. Following insertion, Biopatch and dressing applied. Post procedure assessment: normal  Patient tolerated the procedure well with no immediate complications.

## 2018-11-20 NOTE — Progress Notes (Signed)
      [address]       [place], [address]             [phone]    S/p CABG x 4   Intubated, sedated, not waking up yet  BP (!) 111/55   Pulse 80   Temp 97.7 F (36.5 C)   Resp 12   Ht 5\' 4"  (1.626 m)   Wt 75.7 kg   SpO2 100%   BMI 28.65 kg/m  24/11 CI= 2.7  Intake/Output Summary (Last 24 hours) at 11/20/2018 1714 Last data filed at 11/20/2018 1700 Gross per 24 hour  Intake 2149.67 ml  Output 975 ml  Net 1174.67 ml   Minimal CT output  Doing well early postop  Remo Lipps C. Roxan Hockey, MD [facility] [phone]

## 2018-11-20 NOTE — Progress Notes (Signed)
Ring placed in denture cup and cell phone in ziploc bag and put with pt belongings.

## 2018-11-20 NOTE — Procedures (Signed)
Extubation Procedure Note  Patient Details:   Name: [name] DOB: 1941-05-08 MRN: [id number]   Airway Documentation:  Airway 7.5 mm (Active)  Secured at (cm) 21 cm 11/20/18 2122  Measured From Lips 11/20/18 2122  Secured Location Right 11/20/18 2122  Secured By [employer] Tape 11/20/18 2122  Cuff Pressure (cm H2O) 28 cm H2O 11/20/18 2055  Site Condition Dry 11/20/18 2055   Vent end date: 11/20/18 Vent end time: 2213   Evaluation  O2 sats: stable throughout Complications: No apparent complications Patient did tolerate procedure well. Bilateral Breath Sounds: Clear, Diminished  No strider heard over upper airways. NIF=-20 with Vital Capacity=.[phone] Yes  [name] 11/20/2018, 10:17 PM

## 2018-11-20 NOTE — Brief Op Note (Signed)
11/06/2018 - 11/20/2018  4:18 PM  PATIENT:  [name]  78 y.o. female  PRE-OPERATIVE DIAGNOSIS:  CAD  POST-OPERATIVE DIAGNOSIS:  CAD  PROCEDURE:  Procedure(s): CORONARY ARTERY BYPASS GRAFTING (CABG), ON PUMP, TIMES four, USING LEFT INTERNAL MAMMARY ARTERY AND ENDOSCOPICALLY HARVESTED RIGHT GREATER SAPHENOUS VEIN (N/A) TRANSESOPHAGEAL ECHOCARDIOGRAM (TEE) (N/A)  SURGEON:  Surgeon(s) and Role:    * Melrose Nakayama, MD - Primary  PHYSICIAN ASSISTANT: WAYNE GOLD PA-C  ANESTHESIA:   general  EBL:  600 mL   BLOOD ADMINISTERED:none  DRAINS: PLEURAL AND PERICARDIAL CHEST DRAINS   LOCAL MEDICATIONS USED:  NONE  SPECIMEN:  No Specimen and Lavage/Washing  DISPOSITION OF SPECIMEN:  N/A  COUNTS:  YES  TOURNIQUET:  * No tourniquets in log *  DICTATION: .Other Dictation: Dictation Number [id number]  PLAN OF CARE: Admit to inpatient   PATIENT DISPOSITION:  ICU - intubated and hemodynamically stable.   Delay start of Pharmacological VTE agent (>24hrs) due to surgical blood loss or risk of bleeding: yes  COMPLICATIONS: NO KNOWN

## 2018-11-20 NOTE — Interval H&P Note (Signed)
History and Physical Interval Note:  11/20/2018 7:24 AM  [name]  has presented today for surgery, with the diagnosis of CAD.  The various methods of treatment have been discussed with the patient and family. After consideration of risks, benefits and other options for treatment, the patient has consented to  Procedure(s): CORONARY ARTERY BYPASS GRAFTING (CABG) (N/A) TRANSESOPHAGEAL ECHOCARDIOGRAM (TEE) (N/A) as a surgical intervention.  The patient's history has been reviewed, patient examined, no change in status, stable for surgery.  I have reviewed the patient's chart and labs.  Questions were answered to the patient's satisfaction.     Melrose Nakayama

## 2018-11-20 NOTE — Transfer of Care (Signed)
Immediate Anesthesia Transfer of Care Note  Patient: [name]  Procedure(s) Performed: CORONARY ARTERY BYPASS GRAFTING (CABG), ON PUMP, TIMES four, USING LEFT INTERNAL MAMMARY ARTERY AND ENDOSCOPICALLY HARVESTED RIGHT GREATER SAPHENOUS VEIN (N/A Chest) TRANSESOPHAGEAL ECHOCARDIOGRAM (TEE) (N/A )  Patient Location: SICU  Anesthesia Type:General  Level of Consciousness: sedated and unresponsive  Airway & Oxygen Therapy: Patient remains intubated per anesthesia plan and Patient placed on Ventilator (see vital sign flow sheet for setting)  Post-op Assessment: Report given to RN and Post -op Vital signs reviewed and stable  Post vital signs: Reviewed and stable  Last Vitals:  Vitals Value Taken Time  BP 72/43 11/20/18 1330  Temp 36.5 C 11/20/18 1333  Pulse 86 11/20/18 1333  Resp 14 11/20/18 1333  SpO2 98 % 11/20/18 1333  Vitals shown include unvalidated device data.  Last Pain:  Vitals:   11/20/18 0348  TempSrc: Oral  PainSc:       Patients Stated Pain Goal: 0 (44/[id number])  Complications: No apparent anesthesia complications

## 2018-11-20 NOTE — Op Note (Signed)
NAME: [name], HEICK MEDICAL RECORD GY:[id number] ACCOUNT [id number] DATE OF BIRTH:04/21/1941 FACILITY: [facility] LOCATION: [facility] PHYSICIAN:Roddrick Sharron Chaya Jan, MD  OPERATIVE REPORT  DATE OF PROCEDURE:  11/20/2018  PREOPERATIVE DIAGNOSES:  Three-vessel coronary artery disease.  POSTOPERATIVE DIAGNOSIS:  Three-vessel coronary artery disease.  PROCEDURE:  Median sternotomy, extracorporeal circulation Coronary artery bypass grafting x 4  Left internal mammary artery to left anterior descending,  Sequential saphenous vein graft to obtuse marginals 1 and 2,  Saphenous vein graft to posterior  Descending Endoscopic vein harvest, right leg.  SURGEON:  Modesto Charon, MD  ASSISTANT:  Jadene Pierini, PA-C  ANESTHESIA:  General.  FINDINGS:  Transesophageal echocardiography showed mild mitral regurgitation, ejection fraction of approximately 50%.  Good quality conduits.  OM1 poor quality target.  Remaining targets, good quality.  CLINICAL NOTE:  [name] is a 78 year old woman with multiple cardiac risk factors and end-stage renal disease with recent onset of hemodialysis.  She presented with an unstable coronary syndrome and ruled in for a non-ST elevation MI.  Echocardiogram  showed ejection fraction of 55-60% and moderate left ventricular hypertrophy.  At cardiac catheterization, she was found to have severe 3-vessel coronary disease with a totally occluded right coronary artery.  She was offered the option of coronary artery  bypass grafting.  The indications, risks, benefits, and alternatives were discussed in detail with the patient.  She understood the high risk nature of the procedure.  She accepted the risks and agreed to proceed.  She had been on Plavix prior to  catheterization and now has had time for Plavix "washout."  DESCRIPTION OF PROCEDURE:  The patient was brought to the preoperative holding area on 11/20/2018.  Anesthesia placed a Swan-Ganz catheter and an arterial  blood pressure monitor line.  She was taken to the operating room, anesthetized and intubated.  A  Foley catheter was placed.  Intravenous antibiotics were administered.  Transesophageal echocardiography was performed.  Findings as noted above.  There was no significant valvular pathology.  Overall reasonably well preserved left ventricular systolic  function.  The chest, abdomen and legs were prepped and draped in the usual sterile fashion.  A timeout was performed.  A median sternotomy was performed and the left internal mammary artery was harvested using standard technique.  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  The mammary artery was a good quality vessel.  It had excellent  flow when divided distally.  Simultaneously the saphenous vein was harvested.  An incision was made below the knee in the right leg.  A vein was identified but was very small in caliber.  An incision was made on the left side at a similar level and  again the vein was very small in caliber.  A second incision was made just above the knee in the right thigh and the greater saphenous vein was identified at that level.  It was felt to be acceptable for use as a graft and was harvested endoscopically  from the right leg.  No vein was taken from the left leg.  Two thousand units of heparin was administered during the vessel harvest.  The remainder of the full heparin dose was given prior to opening the pericardium,  After the conduits were harvested, a sternal retractor was placed and slowly opened over time.  The pericardium was opened.  The ascending aorta was inspected.  There was no evidence of atherosclerotic disease.  After confirming adequate anticoagulation  with ACT measurement, the aorta was cannulated via concentric 2-0 Ethibond pledgeted  pursestring sutures.  A dual stage venous cannula was placed via a pursestring suture in the right atrial appendage.  Cardiopulmonary bypass was initiated.  Flows were  maintained per  protocol.  The patient was cooled to 32 degrees Celsius.  The coronary arteries were inspected and anastomotic sites were chosen.  The conduits were inspected and cut to length.  A foam pad was placed in the pericardium to insulate the  heart.  A temperature probe was placed in the myocardial septum and a cardioplegia cannula was placed in the ascending aorta.  The aorta was cross clamped.  The left ventricle was emptied via the aortic root vent.  Cardiac arrest was achieved with a combination of cold antegrade blood cardioplegia and topical iced saline.  One liter of cardioplegia was administered.  There was a  rapid diastolic arrest.  There was septal cooling to 10 degrees Celsius.  A reversed saphenous vein graft was placed end-to-side to the posterior descending branch of the right coronary.  This vessel did accept a 1.5 mm probe.  It was of good quality at the site of anastomosis.  The vein graft was anastomosed end-to-side with a  running 7-0 Prolene suture.  All anastomoses were probed proximally and distally at their completion to ensure patency.  Cardioplegia was administered at the completion of each vein graft to assess flow and hemostasis, both were good.  Next, a reversed saphenous vein was placed sequentially to obtuse marginals 1 and 2.  Obtuse marginal 1 was a high anterolateral branch.  Obtuse marginal 2 was a large lateral branch.  OM1 was superficially intramyocardial.  It was a 1 mm poor quality  target vessel.  A side-to-side anastomosis was performed to OM1.  OM2 was a 1.5 mm good quality target vessel.  An end-to-side anastomosis was performed to OM2.  Both anastomoses were performed with running 7-0 Prolene sutures.  Both anastomoses were  probed easily proximally and distally.  Cardioplegia was administered down the graft and there was good flow and good hemostasis.  Additional cardioplegia was administered down the aortic root.  The left internal mammary artery was brought  through a window in the pericardium.  The distal end was bevelled.  It was anastomosed end-to-side to the distal LAD.  The LAD was a 1.5 mm good  quality target at the site of the anastomosis.  The mammary was a 1.5 mm good quality conduit.  An end-to-side anastomosis was performed with a running 8-0 Prolene suture.  At completion of the anastomosis, the bulldog clamp was briefly removed to inspect for  hemostasis.  Rapid septal rewarming was noted.  The bulldog clamp was replaced.  The mammary pedicle was tacked to the epicardial surface of the heart with 6-0 Prolene sutures.  Additional cardioplegia was administered.  The vein grafts were cut to length.  The cardioplegia cannula was removed from the ascending aorta.  The proximal vein graft anastomoses were performed to 4.5 mm punch aortotomies with running 6-0 Prolene  sutures.  At completion of the second proximal anastomosis, the patient was placed in Trendelenburg position.  Lidocaine was administered.  The heart and aortic root were de-aired and the aortic crossclamp was removed.  The total crossclamp time was 72  minutes.  The patient spontaneously resumed a bradycardic rhythm.  She did not require defibrillation.  While rewarming was completed, all proximal and distal anastomoses were inspected for hemostasis.  Epicardial pacing wires were placed on the right ventricle and right atrium.  When the  patient had rewarmed to a core temperature of 37 degrees Celsius,  she was weaned from cardiopulmonary bypass on the first attempt.  Total bypass time was 111 minutes.  Initial cardiac index was greater than 2 liters per minute per meter squared.  The patient remained hemodynamically stable throughout the post-bypass  period.  Post-bypass transesophageal echocardiography was essentially unchanged from the pre-bypass study.  A test dose of protamine was administered and was well tolerated.  The atrial and aortic cannulae were removed.  The remainder of  the protamine was  administered without incident.  The chest was irrigated with warm saline.  Hemostasis was achieved.  The pericardium was reapproximated over the aortic root with interrupted 3-0 silk sutures.  The left pleural and mediastinal chest tubes were placed  through separate subcostal incisions and secured with #1 silk sutures.  Sternum was closed with a combination of single and double heavy gauge stainless steel wires.  Pectoralis fascia, subcutaneous tissue and skin were closed in standard fashion.  All  sponge, needle and instrument counts were correct at the end of the procedure.  The patient was taken from the operating room to the Surgical Intensive Care Unit, intubated and in good condition.  TN/NUANCE  D:11/20/2018 T:11/20/2018 JOB:[id number]

## 2018-11-20 NOTE — Plan of Care (Signed)
  Problem: Health Behavior/Discharge Planning: Goal: Ability to manage health-related needs will improve Outcome: Progressing   Problem: Clinical Measurements: Goal: Ability to maintain clinical measurements within normal limits will improve Outcome: Progressing Goal: Will remain free from infection Outcome: Progressing Goal: Diagnostic test results will improve Outcome: Progressing Goal: Respiratory complications will improve Outcome: Progressing Goal: Cardiovascular complication will be avoided Outcome: Progressing   Problem: Activity: Goal: Risk for activity intolerance will decrease Outcome: Progressing   Problem: Nutrition: Goal: Adequate nutrition will be maintained Outcome: Progressing   Problem: Coping: Goal: Level of anxiety will decrease Outcome: Progressing   Problem: Elimination: Goal: Will not experience complications related to bowel motility Outcome: Progressing Goal: Will not experience complications related to urinary retention Outcome: Progressing   Problem: Pain Managment: Goal: General experience of comfort will improve Outcome: Progressing   Problem: Safety: Goal: Ability to remain free from injury will improve Outcome: Progressing   Problem: Skin Integrity: Goal: Risk for impaired skin integrity will decrease Outcome: Progressing   Problem: Education: Goal: Knowledge of General Education information will improve Description: Including pain rating scale, medication(s)/side effects and non-pharmacologic comfort measures Outcome: Progressing   Problem: Health Behavior/Discharge Planning: Goal: Ability to manage health-related needs will improve Outcome: Progressing   Problem: Clinical Measurements: Goal: Ability to maintain clinical measurements within normal limits will improve Outcome: Progressing Goal: Will remain free from infection Outcome: Progressing Goal: Diagnostic test results will improve Outcome: Progressing Goal:  Respiratory complications will improve Outcome: Progressing Goal: Cardiovascular complication will be avoided Outcome: Progressing   Problem: Activity: Goal: Risk for activity intolerance will decrease Outcome: Progressing   Problem: Nutrition: Goal: Adequate nutrition will be maintained Outcome: Progressing   Problem: Coping: Goal: Level of anxiety will decrease Outcome: Progressing   Problem: Elimination: Goal: Will not experience complications related to bowel motility Outcome: Progressing Goal: Will not experience complications related to urinary retention Outcome: Progressing   Problem: Pain Managment: Goal: General experience of comfort will improve Outcome: Progressing   Problem: Safety: Goal: Ability to remain free from injury will improve Outcome: Progressing   Problem: Skin Integrity: Goal: Risk for impaired skin integrity will decrease Outcome: Progressing   Problem: Education: Goal: Understanding of CV disease, CV risk reduction, and recovery process will improve Outcome: Progressing Goal: Individualized Educational Video(s) Outcome: Progressing   Problem: Activity: Goal: Ability to return to baseline activity level will improve Outcome: Progressing   Problem: Cardiovascular: Goal: Ability to achieve and maintain adequate cardiovascular perfusion will improve Outcome: Progressing Goal: Vascular access site(s) Level 0-1 will be maintained Outcome: Progressing   Problem: Health Behavior/Discharge Planning: Goal: Ability to safely manage health-related needs after discharge will improve Outcome: Progressing

## 2018-11-20 NOTE — Progress Notes (Signed)
Patient's son Liliane Channel called to update on patient's status. He stated that he and the patient's friend [name] would be the two calling for updates. Password set up as "[email or web address]".

## 2018-11-20 NOTE — OR Nursing (Signed)
SICU charge calls #[phone]                                #[phone]

## 2018-11-20 NOTE — Anesthesia Procedure Notes (Signed)
Procedure Name: Intubation Date/Time: 11/20/2018 7:55 AM Performed by: Mariea Clonts, CRNA Pre-anesthesia Checklist: Patient identified, Emergency Drugs available, Suction available and Patient being monitored Patient Re-evaluated:Patient Re-evaluated prior to induction Oxygen Delivery Method: Circle System Utilized Preoxygenation: Pre-oxygenation with 100% oxygen Induction Type: IV induction Ventilation: Mask ventilation without difficulty Laryngoscope Size: Miller and 2 Grade View: Grade I Tube type: Oral Tube size: 7.5 mm Number of attempts: 1 Airway Equipment and Method: Stylet and Oral airway Placement Confirmation: ETT inserted through vocal cords under direct vision,  positive ETCO2 and breath sounds checked- equal and bilateral Secured at: 22 cm Tube secured with: Tape Dental Injury: Teeth and Oropharynx as per pre-operative assessment

## 2018-11-20 NOTE — Progress Notes (Signed)
PROGRESS NOTE    CHANDNI GAGAN  OAC:[id number] DOB: 1941-05-04 DOA: 11/06/2018 PCP: Doree Albee, MD   Brief Narrative:  78 year old WF PMHx  DM 2 controlled with complication, HTN, CKD stage 5, CAD s/p STEMI treated medically due to advanced CKD and anemia, gout, chronic combined systolic and diastolic CHF, Hypothyroid,   Presented to ED due to chest pain and dyspnea. She was diagnosed with NSTEMI, acute on chronic diastolic CHF, progressive CKD to ESRD. Nephrology consulted and HD initiated this admission. Cardiology consulted, cardiac cath 6/17 showed severe three-vessel disease, TCTS consulted and plan CABG after Plavix washout (last dose 11/12/2018). Patient for CABG on 11/20/2018. Stable.     Subjective: 6/25 patient with cardiothoracic surgery all day having CABG completed.  Attempted to see patient twice.  Patient s/p CABG    Assessment & Plan:   Principal Problem:   Non-ST elevation (NSTEMI) myocardial infarction [facility]) Active Problems:   HTN (hypertension)   Hyperlipidemia associated with type 2 diabetes mellitus ([place])   Essential hypertension, benign   Hypothyroidism   ESRD on dialysis [facility])   NSTEMI/CAD  Cardiology continues to follow.  Multiple prior admissions with NSTEMI in the setting of severe anemia.  She was treated medically in the past including DAPT and intervention was avoided primarily due to advanced CKD and high risk for pushing her into ESRD requiring HD and due to anemia.  DAPT had to be stopped in 10/2016 due to hemoglobin of 4.4.  Recurrent NSTEMI this admission with associated decompensated CHF and progressive CKD.  Troponin peaked to 4.14.  Continue aspirin, Plavix, metoprolol >to be switch to Toprol at discharge.  Intolerant to statins.  TTE: LVEF 55-60% and grade 1 diastolic dysfunction.  Cardiac cath 6/17 showed severe triple-vessel CAD with chronic occlusion of the distal RCA and thoracic surgery consulted.  As per TCTS  follow-up, CABG on 11/20/2018 after Plavix washout (last dose 11/12/2018).   New ESRD  Nephrology continues to follow for HD needs.  It was felt that Bactrim that was started for foot ulcer probably worsened her chronic kidney disease to ESRD.  Newly started HD this admission.  Has had multiple dialysis thus far this admission, last HD 6/22 and reportedly became transiently hypotensive at HD.  Has been set up for OP HD on TTS schedule when discharged.  Hyperkalemia, metabolic acidosis and asterixis have resolved.  Clinically appears euvolemic at this time.  Stable.     Acute on chronic diastolic CHF  TTE results as above  Volume managed by serial HD as noted above.  Compensated   Acute respiratory failure with hypoxia  Secondary to decompensated CHF.  Resolved.   Type II DM with renal complications  Reasonable inpatient control on SSI.  A1c 5.2 suggest good control.  Continue gabapentin for peripheral neuropathy.   Essential hypertension  Controlled on hydralazine 37.5 mg twice daily and metoprolol increased to 25 mg twice daily.    Reportedly transiently hypotensive at HD yesterday.  Now controlled and stable.   Hypothyroid  Clinically euthyroid.  Continue supplements.  TSH 0.326.  Consider repeating TSH in 4 weeks.   GERD  Continue PPI.   Anemia of ESRD  Prior history of GI bleed  S/p Aranesp on 6/15.  Hemoglobin stable in the high 8-9 range.  Periodically follow CBCs across HD.  Will need to monitor closely for GI bleed and/or worsening anemia while on dual antiplatelets.   Secondary hyperparathyroidism  Continue PhosLo.   Hyperlipidemia  Intolerant of statins.  May need to consider PCSK9 inhibitor as outpatient.   Gout  No acute flare.    DVT prophylaxis: Subcu heparin Code Status: Full Family Communication: None Disposition Plan: TBD   Consultants:  Nephrology Cardiothoracic surgery Cardiology   Procedures/Significant  Events:     I have personally reviewed and interpreted all radiology studies and my findings are as above.  VENTILATOR SETTINGS:    Cultures   Antimicrobials:    Devices    LINES / TUBES:      Continuous Infusions: . [MAR Hold] sodium chloride    . cefUROXime (ZINACEF)  IV    . dexmedetomidine    . DOPamine    . epinephrine    . heparin 30,000 units/NS 1000 mL solution for CELLSAVER    . milrinone    . norepinephrine    . tranexamic acid (CYKLOKAPRON) infusion (O[facility])       Objective: Vitals:   11/19/18 1600 11/19/18 2148 11/20/18 0112 11/20/18 0348  BP: (!) 127/53 (!) 146/50  102/82  Pulse: 66 79  63  Resp: 16     Temp: 98.9 F (37.2 C) 98.7 F (37.1 C)  98.2 F (36.8 C)  TempSrc: Oral Oral  Oral  SpO2: 98% 96%  99%  Weight: 74.4 kg  75.7 kg   Height:        Intake/Output Summary (Last 24 hours) at 11/20/2018 6546 Last data filed at 11/20/2018 0000 Gross per 24 hour  Intake 480 ml  Output 1100 ml  Net -620 ml   Filed Weights   11/19/18 1240 11/19/18 1600 11/20/18 0112  Weight: 79.9 kg 74.4 kg 75.7 kg     Examination:  Attempted to see patient twice today, however patient in surgery all day with cardiothoracic surgery.  Patient s/p CABG .     Data Reviewed: Care during the described time interval was provided by me .  I have reviewed this patient's available data, including medical history, events of note, physical examination, and all test results as part of my evaluation.   CBC: Recent Labs  Lab 11/14/18 0448 11/15/18 0508 11/16/18 0703 11/17/18 0619 11/18/18 0446 11/19/18 1315 11/20/18 0558  WBC 7.6 7.3 6.2 6.1 7.5 6.8 6.7  NEUTROABS 4.9 4.5 3.6 3.6 4.9  --   --   HGB 8.8* 9.0* 8.8* 9.1* 9.2* 9.4* 8.9*  HCT 28.5* 29.2* 28.3* 28.9* 28.9* 29.5* 28.3*  MCV 96.6 96.1 95.6 96.0 94.4 94.9 94.3  PLT 308 332 327 330 332 328 503   Basic Metabolic Panel: Recent Labs  Lab 11/17/18 0619 11/17/18 1336 11/19/18 0536 11/19/18 1320  11/20/18 0558  NA  --  137  --  135 137  K  --  4.0  --  4.1 3.5  CL  --  102  --  100 97*  CO2  --  21*  --  20* 26  GLUCOSE  --  151*  --  130* 117*  BUN  --  85*  --  95* 46*  CREATININE 5.71* 5.88* 5.54* 5.33* 4.01*  CALCIUM  --  8.5*  --  8.7* 8.8*  MG  --   --   --   --  2.1  PHOS  --  6.0*  --  5.8*  --    GFR: Estimated Creatinine Clearance: 11.3 mL/min (A) (by C-G formula based on SCr of 4.01 mg/dL (H)). Liver Function Tests: Recent Labs  Lab 11/17/18 1336 11/19/18 1320  ALBUMIN 2.8* 2.9*   No results for input(s):  LIPASE, AMYLASE in the last 168 hours. No results for input(s): AMMONIA in the last 168 hours. Coagulation Profile: Recent Labs  Lab 11/19/18 0903  INR 1.0   Cardiac Enzymes: No results for input(s): CKTOTAL, CKMB, CKMBINDEX, TROPONINI in the last 168 hours. BNP (last 3 results) No results for input(s): PROBNP in the last 8760 hours. HbA1C: Recent Labs    11/20/18 0558  HGBA1C 5.2   CBG: Recent Labs  Lab 11/19/18 0555 11/19/18 1110 11/19/18 1703 11/19/18 2150 11/20/18 0555  GLUCAP 128* 173* 146* 224* 120*   Lipid Profile: Recent Labs    11/20/18 0558  CHOL 336*  HDL 40*  LDLCALC 221*  TRIG 373*  CHOLHDL 8.4   Thyroid Function Tests: No results for input(s): TSH, T4TOTAL, FREET4, T3FREE, THYROIDAB in the last 72 hours. Anemia Panel: No results for input(s): VITAMINB12, FOLATE, FERRITIN, TIBC, IRON, RETICCTPCT in the last 72 hours. Urine analysis:    Component Value Date/Time   COLORURINE YELLOW 11/19/2018 2010   APPEARANCEUR CLOUDY (A) 11/19/2018 2010   LABSPEC 1.018 11/19/2018 2010   PHURINE 5.0 11/19/2018 2010   GLUCOSEU 50 (A) 11/19/2018 2010   HGBUR NEGATIVE 11/19/2018 2010   [place] 11/19/2018 2010   KETONESUR NEGATIVE 11/19/2018 2010   PROTEINUR >=300 (A) 11/19/2018 2010   UROBILINOGEN 0.2 02/08/2015 1615   NITRITE NEGATIVE 11/19/2018 2010   LEUKOCYTESUR NEGATIVE 11/19/2018 2010   Sepsis Labs:  @LABRCNTIP (procalcitonin:4,lacticidven:4)  ) Recent Results (from the past 240 hour(s))  Surgical PCR screen     Status: None   Collection Time: 11/19/18  8:40 PM   Specimen: Nasal Mucosa; Nasal Swab  Result Value Ref Range Status   MRSA, PCR NEGATIVE NEGATIVE Final   Staphylococcus aureus NEGATIVE NEGATIVE Final    Comment: (NOTE) The Xpert SA Assay (FDA approved for NASAL specimens in patients 53 years of age and older), is one component of a comprehensive surveillance program. It is not intended to diagnose infection nor to guide or monitor treatment. Performed at [facility], [place] [address] [place], [place] [address]          Radiology Studies: Dg Chest 2 View  Result Date: 11/19/2018 CLINICAL DATA:  Weakness EXAM: CHEST - 2 VIEW COMPARISON:  11/10/2018 FINDINGS: There are trace bilateral pleural effusions, left greater than right, that have improved since the prior study. The cardiac size is stable. There is mild volume overload. There is no acute osseous abnormality. There are stable surgical changes of the proximal left humerus. Vascular calcifications are again noted. IMPRESSION: Trace bilateral pleural effusions, improved from prior study. Mild volume overload without overt pulmonary edema. Electronically Signed   By: Constance Holster M.D.   On: 11/19/2018 20:46        Scheduled Meds: . [MAR Hold] aspirin EC  81 mg Oral Daily  . [MAR Hold] calcium acetate  667 mg Oral TID WC  . [MAR Hold] Chlorhexidine Gluconate Cloth  6 each Topical Q0600  . Chlorhexidine Gluconate Cloth  6 each Topical Once  . [MAR Hold] ciprofloxacin-dexamethasone  4 drop Left EAR BID  . [MAR Hold] darbepoetin (ARANESP) injection - DIALYSIS  100 mcg Intravenous Q Mon-HD  . [MAR Hold] docusate sodium  100 mg Oral Q12H  . [MAR Hold] febuxostat  40 mg Oral Daily  . [MAR Hold] feeding supplement (NEPRO CARB STEADY)  237 mL Oral Q24H  . [MAR Hold] gabapentin  300 mg Oral QHS  .  heparin-papaverine-plasmalyte irrigation   Irrigation To OR  . [  MAR Hold] heparin  5,000 Units Subcutaneous Q8H  . [MAR Hold] hydrALAZINE  37.5 mg Oral BID  . [MAR Hold] insulin aspart  0-5 Units Subcutaneous QHS  . [MAR Hold] insulin aspart  0-9 Units Subcutaneous TID WC  . insulin   Intravenous To OR  . magnesium sulfate  40 mEq Other To OR  . [MAR Hold] mouth rinse  15 mL Mouth Rinse BID  . [MAR Hold] metoprolol tartrate  25 mg Oral BID  . [MAR Hold] pantoprazole  40 mg Oral QAC breakfast  . phenylephrine  30-200 mcg/min Intravenous To OR  . potassium chloride  80 mEq Other To OR  . [MAR Hold] sodium chloride flush  3 mL Intravenous Q12H  . [MAR Hold] thyroid  120 mg Oral Q0600  . tranexamic acid  2 mg/kg Intracatheter To OR   Continuous Infusions: . [MAR Hold] sodium chloride    . cefUROXime (ZINACEF)  IV    . dexmedetomidine    . DOPamine    . epinephrine    . heparin 30,000 units/NS 1000 mL solution for CELLSAVER    . milrinone    . norepinephrine    . tranexamic acid (CYKLOKAPRON) infusion ([facility])       LOS: 13 days   The patient is critically ill with multiple organ systems failure and requires high complexity decision making for assessment and support, frequent evaluation and titration of therapies, application of advanced monitoring technologies and extensive interpretation of multiple databases. Critical Care Time devoted to patient care services described in this note  Time spent: 40 minutes     Chantil Bari, Geraldo Docker, MD [facility] Pager [phone]  If 7PM-7AM, please contact night-coverage [email or web address] Password [facility] 11/20/2018, 9:03 AM

## 2018-11-20 NOTE — Anesthesia Procedure Notes (Signed)
Central Venous Catheter Insertion Performed by: Lillia Abed, MD, anesthesiologist Start/End6/25/2020 6:20 AM, 11/20/2018 6:30 AM Patient location: Pre-op. Preanesthetic checklist: patient identified, IV checked, risks and benefits discussed, surgical consent, monitors and equipment checked, pre-op evaluation, timeout performed and anesthesia consent Position: Trendelenburg Lidocaine 1% used for infiltration and patient sedated Hand hygiene performed  and maximum sterile barriers used  Catheter size: 8.5 Fr PA cath was placed.MAC introducer Swan type:thermodilution Procedure performed using ultrasound guided technique. Ultrasound Notes:anatomy identified, needle tip was noted to be adjacent to the nerve/plexus identified, no ultrasound evidence of intravascular and/or intraneural injection and image(s) printed for medical record Attempts: 1 Following insertion, line sutured and dressing applied. Post procedure assessment: blood return through all ports, free fluid flow and no air  Patient tolerated the procedure well with no immediate complications.

## 2018-11-20 NOTE — Progress Notes (Signed)
Attempted to put pt on PSV/CPAP 10/5 40%, pt had periods of apnea and was unable to tolerate this wean attempt at this time. Pt placed back on full support. Will continue to monitor.

## 2018-11-20 NOTE — Progress Notes (Signed)
[facility] Progress Note   Assessment/ Plan:    1. Severe 3 vessel CAD--> is now s/o CABG done earlier today  2.ESRD: new start on 6/12.  16 g needles.  CLIP'd to [facility] TTS 11:30. Dry wt likely around 75kg. Last HD yesterday 6/24.  - will assess daily for need for dialysis post-cabg   3. 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 Nutrition: albumin 2.6, prostat  6. Hypertension: on metop 12.5 bid   Kelly Splinter, MD 11/20/2018, 4:32 PM   Subjective:    Seen in room post-op, pt sedated on vent   Objective:   BP (!) 113/52   Pulse 80   Temp 97.9 F (36.6 C)   Resp 12   Ht '5\' 4"'  (1.626 m)   Wt 75.7 kg   SpO2 100%   BMI 28.65 kg/m   Physical Exam: Gen: sedated on vent, multiple lines in place CVS: RRR no m/r/g Resp: cta ant and lat Abd: soft nontender NABS Ext: 1+ diffuse edema bilat ACCESS: R UE AVF +T/B  Labs: BMET Recent Labs  Lab 11/14/18 0448 11/17/18 0619 11/17/18 1336 11/19/18 0536 11/19/18 1320 11/20/18 0558  NA  --   --  137  --  135 137  K  --   --  4.0  --  4.1 3.5  CL  --   --  102  --  100 97*  CO2  --   --  21*  --  20* 26  GLUCOSE  --   --  151*  --  130* 117*  BUN  --   --  85*  --  95* 46*  CREATININE 4.01* 5.71* 5.88* 5.54* 5.33* 4.01*  CALCIUM  --   --  8.5*  --  8.7* 8.8*  PHOS  --   --  6.0*  --  5.8*  --    CBC Recent Labs  Lab 11/15/18 0508 11/16/18 0703 11/17/18 0619 11/18/18 0446 11/19/18 1315 11/20/18 0558 11/20/18 1135 11/20/18 1329  WBC 7.3 6.2 6.1 7.5 6.8 6.7  --  12.8*  NEUTROABS 4.5 3.6 3.6 4.9  --   --   --   --   HGB 9.0* 8.8* 9.1* 9.2* 9.4* 8.9* 7.8* 9.6*  HCT 29.2* 28.3* 28.9* 28.9* 29.5* 28.3* 23.9* 29.8*  MCV 96.1 95.6 96.0 94.4 94.9 94.3  --  90.9  PLT 332 327 330 332 328 294 189 190    '@IMGRELPRIORS' @ Medications:    . [START ON 11/21/2018] acetaminophen  1,000 mg Oral Q6H   Or  . [START ON 11/21/2018]  acetaminophen (TYLENOL) oral liquid 160 mg/5 mL  1,000 mg Per Tube Q6H  . [START ON 11/21/2018] aspirin EC  325 mg Oral Daily   Or  . [START ON 11/21/2018] aspirin  324 mg Per Tube Daily  . [START ON 11/21/2018] bisacodyl  10 mg Oral Daily   Or  . [START ON 11/21/2018] bisacodyl  10 mg Rectal Daily  . calcium acetate  667 mg Oral TID WC  . chlorhexidine gluconate (MEDLINE KIT)  15 mL Mouth Rinse BID  . Chlorhexidine Gluconate Cloth  6 each Topical Daily  . Chlorhexidine Gluconate Cloth  6 each Topical Daily  . ciprofloxacin-dexamethasone  4 drop Left EAR BID  . [START ON 11/21/2018] docusate sodium  200 mg Oral Daily  . insulin regular  0-10 Units Intravenous TID WC  .  mouth rinse  15 mL Mouth Rinse 10 times per day  . metoprolol tartrate  12.5 mg Oral BID   Or  . metoprolol tartrate  12.5 mg Per Tube BID  . [START ON 11/22/2018] pantoprazole  40 mg Oral Daily  . sodium chloride flush  10-40 mL Intracatheter Q12H  . [START ON 11/21/2018] sodium chloride flush  3 mL Intravenous Q12H

## 2018-11-21 ENCOUNTER — Encounter (HOSPITAL_COMMUNITY): Payer: Self-pay | Admitting: Thoracic Surgery (Cardiothoracic Vascular Surgery)

## 2018-11-21 ENCOUNTER — Inpatient Hospital Stay (HOSPITAL_COMMUNITY): Payer: Medicare HMO

## 2018-11-21 DIAGNOSIS — I5033 Acute on chronic diastolic (congestive) heart failure: Secondary | ICD-10-CM | POA: Diagnosis present

## 2018-11-21 DIAGNOSIS — I2510 Atherosclerotic heart disease of native coronary artery without angina pectoris: Secondary | ICD-10-CM | POA: Diagnosis present

## 2018-11-21 DIAGNOSIS — N186 End stage renal disease: Secondary | ICD-10-CM

## 2018-11-21 DIAGNOSIS — I5032 Chronic diastolic (congestive) heart failure: Secondary | ICD-10-CM | POA: Diagnosis present

## 2018-11-21 DIAGNOSIS — E033 Postinfectious hypothyroidism: Secondary | ICD-10-CM

## 2018-11-21 DIAGNOSIS — Z992 Dependence on renal dialysis: Secondary | ICD-10-CM

## 2018-11-21 DIAGNOSIS — E118 Type 2 diabetes mellitus with unspecified complications: Secondary | ICD-10-CM | POA: Diagnosis present

## 2018-11-21 DIAGNOSIS — J9690 Respiratory failure, unspecified, unspecified whether with hypoxia or hypercapnia: Secondary | ICD-10-CM | POA: Diagnosis present

## 2018-11-21 DIAGNOSIS — J9691 Respiratory failure, unspecified with hypoxia: Secondary | ICD-10-CM | POA: Diagnosis present

## 2018-11-21 LAB — BASIC METABOLIC PANEL
Anion gap: 11 (ref 5–15)
BUN: 49 mg/dL — ABNORMAL HIGH (ref 8–23)
CO2: 22 mmol/L (ref 22–32)
Calcium: 8.1 mg/dL — ABNORMAL LOW (ref 8.9–10.3)
Chloride: 105 mmol/L (ref 98–111)
Creatinine, Ser: 4.11 mg/dL — ABNORMAL HIGH (ref 0.44–1.00)
GFR calc Af Amer: 11 mL/min — ABNORMAL LOW (ref >60)
GFR calc non Af Amer: 10 mL/min — ABNORMAL LOW (ref >60)
Glucose, Bld: 103 mg/dL — ABNORMAL HIGH (ref 70–99)
Potassium: 5.2 mmol/L — ABNORMAL HIGH (ref 3.5–5.1)
Sodium: 138 mmol/L (ref 135–145)

## 2018-11-21 LAB — GLUCOSE, CAPILLARY
Glucose-Capillary: 100 mg/dL — ABNORMAL HIGH (ref 70–99)
Glucose-Capillary: 104 mg/dL — ABNORMAL HIGH (ref 70–99)
Glucose-Capillary: 110 mg/dL — ABNORMAL HIGH (ref 70–99)
Glucose-Capillary: 114 mg/dL — ABNORMAL HIGH (ref 70–99)
Glucose-Capillary: 115 mg/dL — ABNORMAL HIGH (ref 70–99)
Glucose-Capillary: 120 mg/dL — ABNORMAL HIGH (ref 70–99)
Glucose-Capillary: 138 mg/dL — ABNORMAL HIGH (ref 70–99)
Glucose-Capillary: 147 mg/dL — ABNORMAL HIGH (ref 70–99)
Glucose-Capillary: 153 mg/dL — ABNORMAL HIGH (ref 70–99)
Glucose-Capillary: 175 mg/dL — ABNORMAL HIGH (ref 70–99)
Glucose-Capillary: 90 mg/dL (ref 70–99)
Glucose-Capillary: 93 mg/dL (ref 70–99)

## 2018-11-21 LAB — CBC
HCT: 28.5 % — ABNORMAL LOW (ref 36.0–46.0)
Hemoglobin: 9.1 g/dL — ABNORMAL LOW (ref 12.0–15.0)
MCH: 29.7 pg (ref 26.0–34.0)
MCHC: 31.9 g/dL (ref 30.0–36.0)
MCV: 93.1 fL (ref 80.0–100.0)
Platelets: 254 10*3/uL (ref 150–400)
RBC: 3.06 MIL/uL — ABNORMAL LOW (ref 3.87–5.11)
RDW: 17.5 % — ABNORMAL HIGH (ref 11.5–15.5)
WBC: 13.7 10*3/uL — ABNORMAL HIGH (ref 4.0–10.5)
nRBC: 0 % (ref 0.0–0.2)

## 2018-11-21 LAB — POCT I-STAT 4, (NA,K, GLUC, HGB,HCT)
Glucose, Bld: 128 mg/dL — ABNORMAL HIGH (ref 70–99)
HCT: 27 % — ABNORMAL LOW (ref 36.0–46.0)
Hemoglobin: 9.2 g/dL — ABNORMAL LOW (ref 12.0–15.0)
Potassium: 4.1 mmol/L (ref 3.5–5.1)
Sodium: 138 mmol/L (ref 135–145)

## 2018-11-21 LAB — POCT I-STAT 7, (LYTES, BLD GAS, ICA,H+H)
Acid-base deficit: 2 mmol/L (ref 0.0–2.0)
Acid-base deficit: 2 mmol/L (ref 0.0–2.0)
Bicarbonate: 24.2 mmol/L (ref 20.0–28.0)
Bicarbonate: 21.7 mmol/L (ref 20.0–28.0)
Calcium, Ion: 1.03 mmol/L — ABNORMAL LOW (ref 1.15–1.40)
Calcium, Ion: 1.12 mmol/L — ABNORMAL LOW (ref 1.15–1.40)
HCT: 31 % — ABNORMAL LOW (ref 36.0–46.0)
HCT: 27 % — ABNORMAL LOW (ref 36.0–46.0)
Hemoglobin: 10.5 g/dL — ABNORMAL LOW (ref 12.0–15.0)
Hemoglobin: 9.2 g/dL — ABNORMAL LOW (ref 12.0–15.0)
O2 Saturation: 99 %
O2 Saturation: 99 %
Patient temperature: 36.5
Patient temperature: 98.69
Potassium: 4.1 mmol/L (ref 3.5–5.1)
Potassium: 5 mmol/L (ref 3.5–5.1)
Sodium: 140 mmol/L (ref 135–145)
Sodium: 136 mmol/L (ref 135–145)
TCO2: 26 mmol/L (ref 22–32)
TCO2: 23 mmol/L (ref 22–32)
pCO2 arterial: 43.8 mmHg (ref 32.0–48.0)
pCO2 arterial: 32.7 mmHg (ref 32.0–48.0)
pH, Arterial: 7.348 — ABNORMAL LOW (ref 7.350–7.450)
pH, Arterial: 7.431 (ref 7.350–7.450)
pO2, Arterial: 160 mmHg — ABNORMAL HIGH (ref 83.0–108.0)
pO2, Arterial: 152 mmHg — ABNORMAL HIGH (ref 83.0–108.0)

## 2018-11-21 LAB — MAGNESIUM: Magnesium: 2.2 mg/dL (ref 1.7–2.4)

## 2018-11-21 MED ORDER — THYROID 120 MG PO TABS
120.0000 mg | ORAL_TABLET | Freq: Every day | ORAL | Status: DC
  Administered 2018-11-21 – 2018-11-30 (×10): 120 mg via ORAL
  Filled 2018-11-21 (×11): qty 1

## 2018-11-21 MED ORDER — ENOXAPARIN SODIUM 30 MG/0.3ML ~~LOC~~ SOLN: 30.0000 mg | Freq: Every day | SUBCUTANEOUS | Status: DC

## 2018-11-21 MED ORDER — AMIODARONE HCL IN DEXTROSE 360-4.14 MG/200ML-% IV SOLN
30.0000 mg/h | INTRAVENOUS | Status: DC
  Administered 2018-11-22: 30 mg/h via INTRAVENOUS
  Filled 2018-11-21: qty 200

## 2018-11-21 MED ORDER — CHLORHEXIDINE GLUCONATE CLOTH 2 % EX PADS: 6.0000 | MEDICATED_PAD | Freq: Every day | CUTANEOUS | Status: DC

## 2018-11-21 MED ORDER — FEBUXOSTAT 40 MG PO TABS
40.0000 mg | ORAL_TABLET | Freq: Every day | ORAL | Status: DC
  Administered 2018-11-21 – 2018-11-30 (×10): 40 mg via ORAL
  Filled 2018-11-21 (×11): qty 1

## 2018-11-21 MED ORDER — GABAPENTIN 300 MG PO CAPS
600.0000 mg | ORAL_CAPSULE | Freq: Every day | ORAL | Status: DC
  Administered 2018-11-21 – 2018-11-29 (×7): 600 mg via ORAL
  Filled 2018-11-21 (×8): qty 2

## 2018-11-21 MED ORDER — INSULIN DETEMIR 100 UNIT/ML ~~LOC~~ SOLN
5.0000 [IU] | Freq: Every day | SUBCUTANEOUS | Status: DC
  Administered 2018-11-21 – 2018-11-29 (×9): 5 [IU] via SUBCUTANEOUS
  Filled 2018-11-21 (×11): qty 0.05

## 2018-11-21 MED ORDER — AMIODARONE IV BOLUS ONLY 150 MG/100ML
150.0000 mg | Freq: Once | INTRAVENOUS | Status: AC
  Administered 2018-11-21: 150 mg via INTRAVENOUS

## 2018-11-21 MED ORDER — ASPIRIN EC 81 MG PO TBEC
81.0000 mg | DELAYED_RELEASE_TABLET | Freq: Every day | ORAL | Status: DC
  Administered 2018-11-21 – 2018-11-30 (×10): 81 mg via ORAL
  Filled 2018-11-21 (×10): qty 1

## 2018-11-21 MED ORDER — AMIODARONE HCL IN DEXTROSE 360-4.14 MG/200ML-% IV SOLN
60.0000 mg/h | INTRAVENOUS | Status: AC
  Administered 2018-11-21 (×2): 60 mg/h via INTRAVENOUS
  Filled 2018-11-21 (×2): qty 200

## 2018-11-21 MED ORDER — INSULIN ASPART 100 UNIT/ML ~~LOC~~ SOLN
0.0000 [IU] | SUBCUTANEOUS | Status: DC
  Administered 2018-11-21: 4 [IU] via SUBCUTANEOUS
  Administered 2018-11-21 (×2): 2 [IU] via SUBCUTANEOUS
  Administered 2018-11-22: 4 [IU] via SUBCUTANEOUS

## 2018-11-21 MED FILL — Heparin Sodium (Porcine) Inj 1000 Unit/ML: INTRAMUSCULAR | Qty: 30 | Status: AC

## 2018-11-21 MED FILL — Potassium Chloride Inj 2 mEq/ML: INTRAVENOUS | Qty: 40 | Status: AC

## 2018-11-21 MED FILL — Magnesium Sulfate Inj 50%: INTRAMUSCULAR | Qty: 10 | Status: AC

## 2018-11-21 NOTE — Progress Notes (Signed)
[facility] Progress Note   Assessment/ Plan:    1. Severe 3 vessel CAD : sp CABG on 6/25, doing well  2.ESRD: new start on 6/12.  16 g needles.  CLIP'd to [facility] TTS 11:30. Dry wt likely around 75kg. Last HD yesterday 6/24.  Up only 5kg post op - will plan short HD today and tomorrow 2.5hr, gentle HD , small UF   3. 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 Nutrition: albumin 2.6, prostat  6. Hypertension: on metop 12.5 bid   Kelly Splinter, MD 11/21/2018, 9:02 AM   Subjective:    Seen in room, extubated and alert   Objective:   BP (!) 129/49   Pulse 70   Temp 99.1 F (37.3 C)   Resp 14   Ht 5\' 4"  (1.626 m)   Wt 80.5 kg   SpO2 99%   BMI 30.46 kg/m   Physical Exam: Gen: alert , nasal O2 CVS: RRR no m/r/g Resp: cta ant and lat Abd: soft nontender NABS Ext: 1+ diffuse edema bilat ACCESS: R UE AVF +T/B  Labs: BMET Recent Labs  Lab 11/17/18 0619 11/17/18 1336 11/19/18 0536 11/19/18 1320 11/20/18 0558 11/20/18 1343 11/20/18 1406 11/20/18 1846 11/20/18 2210 11/21/18 0400  NA  --  137  --  135 137 138 140  --  136 138  K  --  4.0  --  4.1 3.5 4.1 4.1  --  5.0 5.2*  CL  --  102  --  100 97*  --   --   --   --  105  CO2  --  21*  --  20* 26  --   --   --   --  22  GLUCOSE  --  151*  --  130* 117* 128*  --   --   --  103*  BUN  --  85*  --  95* 46*  --   --   --   --  49*  CREATININE 5.71* 5.88* 5.54* 5.33* 4.01*  --   --  3.84*  --  4.11*  CALCIUM  --  8.5*  --  8.7* 8.8*  --   --   --   --  8.1*  PHOS  --  6.0*  --  5.8*  --   --   --   --   --   --    CBC Recent Labs  Lab 11/15/18 0508 11/16/18 0703 11/17/18 0619 11/18/18 0446  11/20/18 0558 11/20/18 1135 11/20/18 1329  11/20/18 1406 11/20/18 1846 11/20/18 2210 11/21/18 0400  WBC 7.3 6.2 6.1 7.5   < > 6.7  --  12.8*  --   --  16.3*  --  13.7*  NEUTROABS 4.5 3.6 3.6 4.9  --   --   --   --   --   --    --   --   --   HGB 9.0* 8.8* 9.1* 9.2*   < > 8.9* 7.8* 9.6*   < > 10.5* 10.0* 9.2* 9.1*  HCT 29.2* 28.3* 28.9* 28.9*   < > 28.3* 23.9* 29.8*   < > 31.0* 31.1* 27.0* 28.5*  MCV 96.1 95.6 96.0 94.4   < > 94.3  --  90.9  --   --  92.0  --  93.1  PLT 332 327 330 332   < >  294 189 190  --   --  236  --  254   < > = values in this interval not displayed.    @IMGRELPRIORS @ Medications:    . acetaminophen  1,000 mg Oral Q6H   Or  . acetaminophen (TYLENOL) oral liquid 160 mg/5 mL  1,000 mg Per Tube Q6H  . aspirin EC  81 mg Oral Daily  . bisacodyl  10 mg Oral Daily   Or  . bisacodyl  10 mg Rectal Daily  . calcium acetate  667 mg Oral TID WC  . Chlorhexidine Gluconate Cloth  6 each Topical Daily  . Chlorhexidine Gluconate Cloth  6 each Topical Daily  . ciprofloxacin-dexamethasone  4 drop Left EAR BID  . docusate sodium  200 mg Oral Daily  . enoxaparin (LOVENOX) injection  30 mg Subcutaneous QHS  . febuxostat  40 mg Oral Daily  . gabapentin  600 mg Oral QHS  . insulin aspart  0-24 Units Subcutaneous Q4H  . insulin detemir  5 Units Subcutaneous QHS  . insulin regular  0-10 Units Intravenous TID WC  . metoprolol tartrate  12.5 mg Oral BID   Or  . metoprolol tartrate  12.5 mg Per Tube BID  . [START ON 11/22/2018] pantoprazole  40 mg Oral Daily  . sodium chloride flush  10-40 mL Intracatheter Q12H  . sodium chloride flush  3 mL Intravenous Q12H  . thyroid  120 mg Oral Q0600

## 2018-11-21 NOTE — Progress Notes (Signed)
1 Day Post-Op Procedure(s) (LRB): CORONARY ARTERY BYPASS GRAFTING (CABG), ON PUMP, TIMES four, USING LEFT INTERNAL MAMMARY ARTERY AND ENDOSCOPICALLY HARVESTED RIGHT GREATER SAPHENOUS VEIN (N/A) TRANSESOPHAGEAL ECHOCARDIOGRAM (TEE) (N/A) Subjective: Some incisional pain  Objective: Vital signs in last 24 hours: Temp:  [97.7 F (36.5 C)-100.4 F (38 C)] 99.1 F (37.3 C) (06/26 0715) Pulse Rate:  [64-86] 80 (06/26 0715) Cardiac Rhythm: Atrial paced (06/26 0400) Resp:  [0-19] 14 (06/26 0715) BP: (72-141)/(43-94) 126/49 (06/26 0715) SpO2:  [96 %-100 %] 99 % (06/26 0715) Arterial Line BP: (4-156)/(0-92) 4/0 (06/26 0545) FiO2 (%):  [40 %-50 %] 40 % (06/25 2139) Weight:  [80.5 kg] 80.5 kg (06/26 0500)  Hemodynamic parameters for last 24 hours: PAP: (13-27)/(2-16) 14/2 CO:  [3.7 L/min-5 L/min] 4.9 L/min CI:  [2 L/min/m2-2.7 L/min/m2] 2.7 L/min/m2  Intake/Output from previous day: 06/25 0701 - 06/26 0700 In: 2948.9 [I.V.:1951.6; Blood:300; NG/GT:30; IV Piggyback:667.3] Out: 1115 [Urine:315; Blood:600; Chest Tube:200] Intake/Output this shift: No intake/output data recorded.  General appearance: alert, cooperative and no distress Neurologic: intact Heart: regular rate and rhythm Lungs: diminished breath sounds bibasilar Abdomen: normal findings: soft, non-tender  Lab Results: Recent Labs    11/20/18 1846 11/20/18 2210 11/21/18 0400  WBC 16.3*  --  13.7*  HGB 10.0* 9.2* 9.1*  HCT 31.1* 27.0* 28.5*  PLT 236  --  254   BMET:  Recent Labs    11/20/18 0558 11/20/18 1343  11/20/18 1846 11/20/18 2210 11/21/18 0400  NA 137 138   < >  --  136 138  K 3.5 4.1   < >  --  5.0 5.2*  CL 97*  --   --   --   --  105  CO2 26  --   --   --   --  22  GLUCOSE 117* 128*  --   --   --  103*  BUN 46*  --   --   --   --  49*  CREATININE 4.01*  --   --  3.84*  --  4.11*  CALCIUM 8.8*  --   --   --   --  8.1*   < > = values in this interval not displayed.    PT/INR:  Recent Labs   11/20/18 1329  LABPROT 17.1*  INR 1.4*   ABG    Component Value Date/Time   PHART 7.431 11/20/2018 2210   HCO3 21.7 11/20/2018 2210   TCO2 23 11/20/2018 2210   ACIDBASEDEF 2.0 11/20/2018 2210   O2SAT 99.0 11/20/2018 2210   CBG (last 3)  Recent Labs    11/21/18 0110 11/21/18 0205 11/21/18 0330  GLUCAP 93 100* 90    Assessment/Plan: S/P Procedure(s) (LRB): CORONARY ARTERY BYPASS GRAFTING (CABG), ON PUMP, TIMES four, USING LEFT INTERNAL MAMMARY ARTERY AND ENDOSCOPICALLY HARVESTED RIGHT GREATER SAPHENOUS VEIN (N/A) TRANSESOPHAGEAL ECHOCARDIOGRAM (TEE) (N/A) -POD # 1 Overall look good CV- stable in SR, good CO- dc swan RESP- IS for atelectasis RENAL- ESRD on HD. K 5.2  Dialysis per Nephrology ENDO- CBG well controlled  Change to levemir + SSI  Home insulin dose is 5 U QHS Anemia acute secondary to ABL on chronic secondary to ESRD- follow SCD + enoxaparin + ambulation for DVT prophylaxis Cardiac rehab Dc chest tubes   LOS: 14 days    Melrose Nakayama 11/21/2018

## 2018-11-21 NOTE — Progress Notes (Signed)
PROGRESS NOTE    [name]  YPP:[id number] DOB: November 03, 1940 DOA: 11/06/2018 PCP: Doree Albee, MD   Brief Narrative:  78 year old WF PMHx  DM 2 controlled with complication, HTN, CKD stage 5, CAD s/p STEMI treated medically due to advanced CKD and anemia, gout, chronic combined systolic and diastolic CHF, Hypothyroid,   Presented to ED due to chest pain and dyspnea. She was diagnosed with NSTEMI, acute on chronic diastolic CHF, progressive CKD to ESRD. Nephrology consulted and HD initiated this admission. Cardiology consulted, cardiac cath 6/17 showed severe three-vessel disease, TCTS consulted and plan CABG after Plavix washout (last dose 11/12/2018). Patient for CABG on 11/20/2018. Stable.     Subjective: 6/26 A/O x4, negative S OB, positive appropriate post CABG CP, negative abdominal pain, negative N/V  Assessment & Plan:   Principal Problem:   Non-ST elevation (NSTEMI) myocardial infarction [facility]) Active Problems:   HTN (hypertension)   Hyperlipidemia associated with type 2 diabetes mellitus (HCC)   Essential hypertension, benign   Hypothyroidism   ESRD on dialysis ([place])   S/P CABG x 4   CAD (coronary artery disease)   Acute on chronic diastolic CHF (congestive heart failure) (HCC)   Respiratory failure with hypoxia (HCC)   End-stage renal disease on hemodialysis ([place])   Diabetes mellitus type 2, controlled, with complications ([place])  NSTEMI/CAD -6/17 showed severe triple-vessel CAD with chronic occlusion distal RCA; cardiothoracic surgery consulted -6/17 last dose of Plavix administered will have to await Plavix washout, CABG tentatively scheduled for 6/25. -6/25 s/p CABG  Acute on chronic diastolic CHF -See NSTEMI -Strict in and out -Daily weight - Metoprolol 12.5 mg twice daily  Essential HTN  -See CHF  Respiratory failure with hypoxia - Secondary to decompensated CHF has resolved. - Titrate O2 to maintain SPO2> 93%  ESRD (new) T/Th/Sat  - Most  likely secondary to Bactrim, cardiac catheterization.  - CRRT/HD per nephrology - Patient will be on T/TH/Sat HD session upon discharge   Hyperkalemia -Slightly elevated monitor closely should correct with HD  Diabetes type 2 controlled with complication - 4/58 hemoglobin A1c= 5.2 - Levemir 5 units nightly - Custom SSI  HLD - LDL not within ADA/AHA guidelines - LDL goal<70 - Patient was sleepy today, allergy state that she cannot tolerate statins.  Will address in the a.m. when she is more awake considering her LDL is extremely elevated and given her new CABG.   Hypothyroid - Thyroid ([place]) 120 mg daily  Gout - Uloric 40 mg daily -Uric acid pending  Anemia of ESRD  Prior history of GI bleed  S/p Aranesp on 6/15.  Hemoglobin stable in the high 8-9 range.  Periodically follow CBCs across HD.  Will need to monitor closely for GI bleed and/or worsening anemia while on dual antiplatelets.   Secondary hyperparathyroidism  Continue PhosLo.     DVT prophylaxis: Subcu heparin Code Status: Full Family Communication: None Disposition Plan: TBD   Consultants:  Nephrology Cardiothoracic surgery Cardiology   Procedures/Significant Events:  6/25 CABG   I have personally reviewed and interpreted all radiology studies and my findings are as above.  VENTILATOR SETTINGS:    Cultures   Antimicrobials:    Devices    LINES / TUBES:      Continuous Infusions:  sodium chloride     cefUROXime (ZINACEF)  IV Stopped (11/20/18 2031)   dexmedetomidine (PRECEDEX) IV infusion Stopped (11/20/18 1609)   lactated ringers     lactated ringers     lactated ringers  20 mL/hr at 11/21/18 1800   magnesium sulfate     nitroGLYCERIN Stopped (11/20/18 1609)   phenylephrine (NEO-SYNEPHRINE) Adult infusion Stopped (11/21/18 0610)     Objective: Vitals:   11/21/18 1700 11/21/18 1800 11/21/18 1900 11/21/18 1939  BP: (!) 107/50 (!) 117/56 (!) 130/52   Pulse:  71 (!) 105 (!) 123   Resp: 15 15 15    Temp:    98.4 F (36.9 C)  TempSrc:    Oral  SpO2: 98% 99% 100%   Weight:      Height:        Intake/Output Summary (Last 24 hours) at 11/21/2018 1947 Last data filed at 11/21/2018 1800 Gross per 24 hour  Intake 1390.05 ml  Output 785 ml  Net 605.05 ml   Filed Weights   11/20/18 0112 11/21/18 0500 11/21/18 1350  Weight: 75.7 kg 80.5 kg 80.5 kg   Physical Exam:  General: Sleepy but arousable, A/O x4, no acute respiratory distress Eyes: negative scleral hemorrhage, negative anisocoria, negative icterus ENT: Negative Runny nose, negative gingival bleeding, Neck:  Negative scars, masses, torticollis, lymphadenopathy, JVD, RIGHT IJ triple-lumen covered and clean negative sign of infection Lungs: Clear to auscultation bilaterally without wheezes or crackles Cardiovascular: Regular rate and rhythm without murmur gallop or rub normal S1 and S2, surgical incision covered by honeycomb dressing negative sign of infection or bleeding Abdomen: negative abdominal pain, nondistended, positive soft, bowel sounds, no rebound, no ascites, no appreciable mass Extremities: No significant cyanosis, clubbing, or edema bilateral lower extremities Skin: Negative rashes, lesions, ulcers Psychiatric:  Negative depression, negative anxiety, negative fatigue, negative mania  Central nervous system:  Cranial nerves II through XII intact, tongue/uvula midline, all extremities muscle strength 5/5, sensation intact throughout, negative dysarthria, negative expressive aphasia, negative receptive aphasia. .     Data Reviewed: Care during the described time interval was provided by me .  I have reviewed this patient's available data, including medical history, events of note, physical examination, and all test results as part of my evaluation.   CBC: Recent Labs  Lab 11/15/18 0508 11/16/18 0703 11/17/18 0619 11/18/18 0446 11/19/18 1315 11/20/18 0558 11/20/18 1135  11/20/18 1329 11/20/18 1343 11/20/18 1406 11/20/18 1846 11/20/18 2210 11/21/18 0400  WBC 7.3 6.2 6.1 7.5 6.8 6.7  --  12.8*  --   --  16.3*  --  13.7*  NEUTROABS 4.5 3.6 3.6 4.9  --   --   --   --   --   --   --   --   --   HGB 9.0* 8.8* 9.1* 9.2* 9.4* 8.9* 7.8* 9.6* 9.2* 10.5* 10.0* 9.2* 9.1*  HCT 29.2* 28.3* 28.9* 28.9* 29.5* 28.3* 23.9* 29.8* 27.0* 31.0* 31.1* 27.0* 28.5*  MCV 96.1 95.6 96.0 94.4 94.9 94.3  --  90.9  --   --  92.0  --  93.1  PLT 332 327 330 332 328 294 189 190  --   --  236  --  038   Basic Metabolic Panel: Recent Labs  Lab 11/17/18 1336 11/19/18 0536 11/19/18 1320 11/20/18 0558 11/20/18 1343 11/20/18 1406 11/20/18 1846 11/20/18 2210 11/21/18 0400  NA 137  --  135 137 138 140  --  136 138  K 4.0  --  4.1 3.5 4.1 4.1  --  5.0 5.2*  CL 102  --  100 97*  --   --   --   --  105  CO2 21*  --  20* 26  --   --   --   --  22  GLUCOSE 151*  --  130* 117* 128*  --   --   --  103*  BUN 85*  --  95* 46*  --   --   --   --  49*  CREATININE 5.88* 5.54* 5.33* 4.01*  --   --  3.84*  --  4.11*  CALCIUM 8.5*  --  8.7* 8.8*  --   --   --   --  8.1*  MG  --   --   --  2.1  --   --  2.3  --  2.2  PHOS 6.0*  --  5.8*  --   --   --   --   --   --    GFR: Estimated Creatinine Clearance: 11.4 mL/min (A) (by C-G formula based on SCr of 4.11 mg/dL (H)). Liver Function Tests: Recent Labs  Lab 11/17/18 1336 11/19/18 1320  ALBUMIN 2.8* 2.9*   No results for input(s): LIPASE, AMYLASE in the last 168 hours. No results for input(s): AMMONIA in the last 168 hours. Coagulation Profile: Recent Labs  Lab 11/19/18 0903 11/20/18 1329  INR 1.0 1.4*   Cardiac Enzymes: No results for input(s): CKTOTAL, CKMB, CKMBINDEX, TROPONINI in the last 168 hours. BNP (last 3 results) No results for input(s): PROBNP in the last 8760 hours. HbA1C: Recent Labs    11/20/18 0558  HGBA1C 5.2   CBG: Recent Labs  Lab 11/21/18 0205 11/21/18 0330 11/21/18 1119 11/21/18 1528 11/21/18 1936    GLUCAP 100* 90 175* 147* 120*   Lipid Profile: Recent Labs    11/20/18 0558  CHOL 336*  HDL 40*  LDLCALC 221*  TRIG 373*  CHOLHDL 8.4   Thyroid Function Tests: No results for input(s): TSH, T4TOTAL, FREET4, T3FREE, THYROIDAB in the last 72 hours. Anemia Panel: No results for input(s): VITAMINB12, FOLATE, FERRITIN, TIBC, IRON, RETICCTPCT in the last 72 hours. Urine analysis:    Component Value Date/Time   COLORURINE YELLOW 11/19/2018 2010   APPEARANCEUR CLOUDY (A) 11/19/2018 2010   LABSPEC 1.018 11/19/2018 2010   PHURINE 5.0 11/19/2018 2010   GLUCOSEU 50 (A) 11/19/2018 2010   HGBUR NEGATIVE 11/19/2018 2010   [place] 11/19/2018 2010   KETONESUR NEGATIVE 11/19/2018 2010   PROTEINUR >=300 (A) 11/19/2018 2010   UROBILINOGEN 0.2 02/08/2015 1615   NITRITE NEGATIVE 11/19/2018 2010   LEUKOCYTESUR NEGATIVE 11/19/2018 2010   Sepsis Labs: @LABRCNTIP (procalcitonin:4,lacticidven:4)  ) Recent Results (from the past 240 hour(s))  Surgical PCR screen     Status: None   Collection Time: 11/19/18  8:40 PM   Specimen: Nasal Mucosa; Nasal Swab  Result Value Ref Range Status   MRSA, PCR NEGATIVE NEGATIVE Final   Staphylococcus aureus NEGATIVE NEGATIVE Final    Comment: (NOTE) The Xpert SA Assay (FDA approved for NASAL specimens in patients 14 years of age and older), is one component of a comprehensive surveillance program. It is not intended to diagnose infection nor to guide or monitor treatment. Performed at [facility], [place] [address], [place], [place] [address]          Radiology Studies: Dg Chest 2 View  Result Date: 11/19/2018 CLINICAL DATA:  Weakness EXAM: CHEST - 2 VIEW COMPARISON:  11/10/2018 FINDINGS: There are trace bilateral pleural effusions, left greater than right, that have improved since the prior study. The cardiac size is stable. There is mild volume overload. There is no acute osseous abnormality. There are stable surgical changes of  the proximal  left humerus. Vascular calcifications are again noted. IMPRESSION: Trace bilateral pleural effusions, improved from prior study. Mild volume overload without overt pulmonary edema. Electronically Signed   By: Constance Holster M.D.   On: 11/19/2018 20:46   Dg Chest Port 1 View  Result Date: 11/21/2018 CLINICAL DATA:  78 year old female postoperative day 1 status post CABG. EXAM: PORTABLE CHEST 1 VIEW COMPARISON:  11/20/2018 and earlier. FINDINGS: Portable AP semi upright view at 0536 hours. Extubated and enteric tube removed. Stable right IJ approach Swan-Ganz catheter, tip at the main pulmonary outflow tract. Stable bilateral chest tubes. Mildly lower lung volumes and increased lower lobe opacity greater on the left. No pneumothorax or pulmonary edema. Stable cardiac size and mediastinal contours. Negative visible bowel gas pattern. Previous left humerus ORIF. IMPRESSION: 1. Extubated and enteric tube removed. Otherwise stable lines and tubes. 2. Lower lung volumes with increased lower lobe atelectasis. No pneumothorax or pulmonary edema. Electronically Signed   By: Genevie Ann M.D.   On: 11/21/2018 08:33   Dg Chest Port 1 View  Result Date: 11/20/2018 CLINICAL DATA:  Status post CABG EXAM: PORTABLE CHEST 1 VIEW COMPARISON:  November 19, 2018 FINDINGS: The heart size and mediastinal contours are normal. Endotracheal tube is identified with distal tip 4.2 cm from carina. Mediastinal drain, right chest tube, nasogastric tube are identified in good position. Swan-Ganz catheter is identified with distal tip over the mid heart. Both lungs are clear. There is no pneumothorax. The visualized skeletal structures are stable. IMPRESSION: Postoperative changes as described. Life supporting devices as described. There is no pneumothorax. Electronically Signed   By: Abelardo Diesel M.D.   On: 11/20/2018 14:41        Scheduled Meds:  acetaminophen  1,000 mg Oral Q6H   Or   acetaminophen (TYLENOL) oral  liquid 160 mg/5 mL  1,000 mg Per Tube Q6H   aspirin EC  81 mg Oral Daily   bisacodyl  10 mg Oral Daily   Or   bisacodyl  10 mg Rectal Daily   calcium acetate  667 mg Oral TID WC   Chlorhexidine Gluconate Cloth  6 each Topical Daily   Chlorhexidine Gluconate Cloth  6 each Topical Daily   Chlorhexidine Gluconate Cloth  6 each Topical Q0600   ciprofloxacin-dexamethasone  4 drop Left EAR BID   docusate sodium  200 mg Oral Daily   febuxostat  40 mg Oral Daily   gabapentin  600 mg Oral QHS   insulin aspart  0-24 Units Subcutaneous Q4H   insulin detemir  5 Units Subcutaneous QHS   metoprolol tartrate  12.5 mg Oral BID   Or   metoprolol tartrate  12.5 mg Per Tube BID   [START ON 11/22/2018] pantoprazole  40 mg Oral Daily   sodium chloride flush  10-40 mL Intracatheter Q12H   sodium chloride flush  3 mL Intravenous Q12H   thyroid  120 mg Oral Q0600   Continuous Infusions:  sodium chloride     cefUROXime (ZINACEF)  IV Stopped (11/20/18 2031)   dexmedetomidine (PRECEDEX) IV infusion Stopped (11/20/18 1609)   lactated ringers     lactated ringers     lactated ringers 20 mL/hr at 11/21/18 1800   magnesium sulfate     nitroGLYCERIN Stopped (11/20/18 1609)   phenylephrine (NEO-SYNEPHRINE) Adult infusion Stopped (11/21/18 0610)     LOS: 14 days   The patient is critically ill with multiple organ systems failure and requires high complexity decision making for assessment and support, frequent  evaluation and titration of therapies, application of advanced monitoring technologies and extensive interpretation of multiple databases. Critical Care Time devoted to patient care services described in this note  Time spent: 40 minutes     Laniya Friedl, Geraldo Docker, MD [facility] Pager [phone]  If 7PM-7AM, please contact night-coverage [email or web address] Password [facility] 11/21/2018, 7:47 PM

## 2018-11-21 NOTE — Progress Notes (Signed)
CT Surgery PM Rounds  Tolerated HD today Dev rapid Afib- amiodarone started

## 2018-11-21 NOTE — Progress Notes (Signed)
Progress Note  Patient Name: [name] Date of Encounter: 11/21/2018  Primary Cardiologist: Carlyle Dolly, MD   Subjective   Comfortable in bed, currently sleeping.  Inpatient Medications    Scheduled Meds:  acetaminophen  1,000 mg Oral Q6H   Or   acetaminophen (TYLENOL) oral liquid 160 mg/5 mL  1,000 mg Per Tube Q6H   aspirin EC  81 mg Oral Daily   bisacodyl  10 mg Oral Daily   Or   bisacodyl  10 mg Rectal Daily   calcium acetate  667 mg Oral TID WC   Chlorhexidine Gluconate Cloth  6 each Topical Daily   Chlorhexidine Gluconate Cloth  6 each Topical Daily   ciprofloxacin-dexamethasone  4 drop Left EAR BID   docusate sodium  200 mg Oral Daily   enoxaparin (LOVENOX) injection  30 mg Subcutaneous QHS   febuxostat  40 mg Oral Daily   gabapentin  600 mg Oral QHS   insulin aspart  0-24 Units Subcutaneous Q4H   insulin detemir  5 Units Subcutaneous QHS   insulin regular  0-10 Units Intravenous TID WC   metoprolol tartrate  12.5 mg Oral BID   Or   metoprolol tartrate  12.5 mg Per Tube BID   [START ON 11/22/2018] pantoprazole  40 mg Oral Daily   sodium chloride flush  10-40 mL Intracatheter Q12H   sodium chloride flush  3 mL Intravenous Q12H   thyroid  120 mg Oral Q0600   Continuous Infusions:  sodium chloride 20 mL/hr at 11/21/18 0700   sodium chloride     sodium chloride 20 mL/hr at 11/21/18 0700   albumin human 12.5 g (11/20/18 1420)   cefUROXime (ZINACEF)  IV Stopped (11/20/18 2031)   dexmedetomidine (PRECEDEX) IV infusion Stopped (11/20/18 1609)   lactated ringers     lactated ringers     lactated ringers     magnesium sulfate     nitroGLYCERIN Stopped (11/20/18 1609)   phenylephrine (NEO-SYNEPHRINE) Adult infusion Stopped (11/21/18 0610)   PRN Meds: sodium chloride, albumin human, lactated ringers, metoprolol tartrate, midazolam, morphine injection, ondansetron (ZOFRAN) IV, oxyCODONE, sodium chloride flush, sodium  chloride flush, traMADol   Vital Signs    Vitals:   11/21/18 0645 11/21/18 0700 11/21/18 0715 11/21/18 0800  BP: (!) 123/49 (!) 127/50 (!) 126/49 (!) 129/49  Pulse: 80 81 80 70  Resp: (!) 9 (!) 7 14 14   Temp: 99.1 F (37.3 C) 99.1 F (37.3 C) 99.1 F (37.3 C)   TempSrc:      SpO2: 99% 99% 99% 99%  Weight:      Height:        Intake/Output Summary (Last 24 hours) at 11/21/2018 0851 Last data filed at 11/21/2018 0800 Gross per 24 hour  Intake 2948.92 ml  Output 1115 ml  Net 1833.92 ml   Last 3 Weights 11/21/2018 11/20/2018 11/19/2018  Weight (lbs) 177 lb 7.5 oz 166 lb 14.2 oz 164 lb 0.4 oz  Weight (kg) 80.5 kg 75.7 kg 74.4 kg      Telemetry    Atrial pacing, normal sinus rhythm- Personally Reviewed  ECG    Normal sinus rhythm 70 without any other abnormalities- Personally Reviewed  Physical Exam   GEN: No acute distress.  Resting comfortably in bed Neck: No JVD Cardiac: RRR, no murmurs, rubs, or gallops.  Chest wall scar Respiratory: Clear to auscultation bilaterally. GI: Soft, nontender, non-distended  MS: No edema; No deformity. Neuro:  Nonfocal  Psych: Normal affect  Labs    High Sensitivity Troponin:  No results for input(s): TROPONINIHS in the last 720 hours.    Cardiac EnzymesNo results for input(s): TROPONINI in the last 168 hours. No results for input(s): TROPIPOC in the last 168 hours.   Chemistry Recent Labs  Lab 11/17/18 1336  11/19/18 1320 11/20/18 0558 11/20/18 1343 11/20/18 1406 11/20/18 1846 11/20/18 2210 11/21/18 0400  NA 137  --  135 137 138 140  --  136 138  K 4.0  --  4.1 3.5 4.1 4.1  --  5.0 5.2*  CL 102  --  100 97*  --   --   --   --  105  CO2 21*  --  20* 26  --   --   --   --  22  GLUCOSE 151*  --  130* 117* 128*  --   --   --  103*  BUN 85*  --  95* 46*  --   --   --   --  49*  CREATININE 5.88*   < > 5.33* 4.01*  --   --  3.84*  --  4.11*  CALCIUM 8.5*  --  8.7* 8.8*  --   --   --   --  8.1*  ALBUMIN 2.8*  --  2.9*  --   --    --   --   --   --   GFRNONAA 6*   < > 7* 10*  --   --  11*  --  10*  GFRAA 7*   < > 8* 12*  --   --  12*  --  11*  ANIONGAP 14  --  15 14  --   --   --   --  11   < > = values in this interval not displayed.     Hematology Recent Labs  Lab 11/20/18 1329  11/20/18 1846 11/20/18 2210 11/21/18 0400  WBC 12.8*  --  16.3*  --  13.7*  RBC 3.28*  --  3.38*  --  3.06*  HGB 9.6*   < > 10.0* 9.2* 9.1*  HCT 29.8*   < > 31.1* 27.0* 28.5*  MCV 90.9  --  92.0  --  93.1  MCH 29.3  --  29.6  --  29.7  MCHC 32.2  --  32.2  --  31.9  RDW 15.9*  --  17.2*  --  17.5*  PLT 190  --  236  --  254   < > = values in this interval not displayed.    BNPNo results for input(s): BNP, PROBNP in the last 168 hours.   DDimer No results for input(s): DDIMER in the last 168 hours.   Radiology    Dg Chest 2 View  Result Date: 11/19/2018 CLINICAL DATA:  Weakness EXAM: CHEST - 2 VIEW COMPARISON:  11/10/2018 FINDINGS: There are trace bilateral pleural effusions, left greater than right, that have improved since the prior study. The cardiac size is stable. There is mild volume overload. There is no acute osseous abnormality. There are stable surgical changes of the proximal left humerus. Vascular calcifications are again noted. IMPRESSION: Trace bilateral pleural effusions, improved from prior study. Mild volume overload without overt pulmonary edema. Electronically Signed   By: Constance Holster M.D.   On: 11/19/2018 20:46   Dg Chest Port 1 View  Result Date: 11/21/2018 CLINICAL DATA:  78 year old female postoperative day 1 status post CABG. EXAM: PORTABLE CHEST  1 VIEW COMPARISON:  11/20/2018 and earlier. FINDINGS: Portable AP semi upright view at 0536 hours. Extubated and enteric tube removed. Stable right IJ approach Swan-Ganz catheter, tip at the main pulmonary outflow tract. Stable bilateral chest tubes. Mildly lower lung volumes and increased lower lobe opacity greater on the left. No pneumothorax or  pulmonary edema. Stable cardiac size and mediastinal contours. Negative visible bowel gas pattern. Previous left humerus ORIF. IMPRESSION: 1. Extubated and enteric tube removed. Otherwise stable lines and tubes. 2. Lower lung volumes with increased lower lobe atelectasis. No pneumothorax or pulmonary edema. Electronically Signed   By: Genevie Ann M.D.   On: 11/21/2018 08:33   Dg Chest Port 1 View  Result Date: 11/20/2018 CLINICAL DATA:  Status post CABG EXAM: PORTABLE CHEST 1 VIEW COMPARISON:  November 19, 2018 FINDINGS: The heart size and mediastinal contours are normal. Endotracheal tube is identified with distal tip 4.2 cm from carina. Mediastinal drain, right chest tube, nasogastric tube are identified in good position. Swan-Ganz catheter is identified with distal tip over the mid heart. Both lungs are clear. There is no pneumothorax. The visualized skeletal structures are stable. IMPRESSION: Postoperative changes as described. Life supporting devices as described. There is no pneumothorax. Electronically Signed   By: Abelardo Diesel M.D.   On: 11/20/2018 14:41    Cardiac Studies   Echo-normal EF  Patient Profile     78 y.o. female with severe three-vessel coronary artery disease, Plavix washout, CABG on 11/20/18 x 4  Assessment & Plan    Coronary artery disease status post CABG -Doing well, off pressors.  Progressing. -Aspirin, metoprolol.  Hyperlipidemia with statin intolerance - Encourage PCSK9 inhibitor as outpatient.  She has tried several statins without success.  Non-ST elevation myocardial infarction - CABG for revascularization. -Cardiac rehab when able  End-stage renal disease - New start dialysis 6/12.  Monday Wednesday Friday.      For questions or updates, please contact [place] Please consult [email or web address] for contact info under        Signed, Candee Furbish, MD  11/21/2018, 8:51 AM

## 2018-11-22 ENCOUNTER — Inpatient Hospital Stay (HOSPITAL_COMMUNITY): Payer: Medicare HMO

## 2018-11-22 DIAGNOSIS — R918 Other nonspecific abnormal finding of lung field: Secondary | ICD-10-CM

## 2018-11-22 DIAGNOSIS — E038 Other specified hypothyroidism: Secondary | ICD-10-CM

## 2018-11-22 LAB — BASIC METABOLIC PANEL
Anion gap: 13 (ref 5–15)
BUN: 45 mg/dL — ABNORMAL HIGH (ref 8–23)
CO2: 23 mmol/L (ref 22–32)
Calcium: 8.1 mg/dL — ABNORMAL LOW (ref 8.9–10.3)
Chloride: 100 mmol/L (ref 98–111)
Creatinine, Ser: 3.9 mg/dL — ABNORMAL HIGH (ref 0.44–1.00)
GFR calc Af Amer: 12 mL/min — ABNORMAL LOW (ref >60)
GFR calc non Af Amer: 10 mL/min — ABNORMAL LOW (ref >60)
Glucose, Bld: 107 mg/dL — ABNORMAL HIGH (ref 70–99)
Potassium: 4.8 mmol/L (ref 3.5–5.1)
Sodium: 136 mmol/L (ref 135–145)

## 2018-11-22 LAB — MAGNESIUM: Magnesium: 2.2 mg/dL (ref 1.7–2.4)

## 2018-11-22 LAB — POCT I-STAT, CHEM 8
BUN: 41 mg/dL — ABNORMAL HIGH (ref 8–23)
Calcium, Ion: 1.11 mmol/L — ABNORMAL LOW (ref 1.15–1.40)
Chloride: 103 mmol/L (ref 98–111)
Creatinine, Ser: 3.5 mg/dL — ABNORMAL HIGH (ref 0.44–1.00)
Glucose, Bld: 115 mg/dL — ABNORMAL HIGH (ref 70–99)
HCT: 30 % — ABNORMAL LOW (ref 36.0–46.0)
Hemoglobin: 10.2 g/dL — ABNORMAL LOW (ref 12.0–15.0)
Potassium: 4.8 mmol/L (ref 3.5–5.1)
Sodium: 137 mmol/L (ref 135–145)
TCO2: 24 mmol/L (ref 22–32)

## 2018-11-22 LAB — GLUCOSE, CAPILLARY
Glucose-Capillary: 112 mg/dL — ABNORMAL HIGH (ref 70–99)
Glucose-Capillary: 112 mg/dL — ABNORMAL HIGH (ref 70–99)
Glucose-Capillary: 110 mg/dL — ABNORMAL HIGH (ref 70–99)
Glucose-Capillary: 107 mg/dL — ABNORMAL HIGH (ref 70–99)
Glucose-Capillary: 97 mg/dL (ref 70–99)
Glucose-Capillary: 173 mg/dL — ABNORMAL HIGH (ref 70–99)
Glucose-Capillary: 101 mg/dL — ABNORMAL HIGH (ref 70–99)
Glucose-Capillary: 148 mg/dL — ABNORMAL HIGH (ref 70–99)

## 2018-11-22 LAB — POCT I-STAT 7, (LYTES, BLD GAS, ICA,H+H)
Acid-base deficit: 1 mmol/L (ref 0.0–2.0)
Bicarbonate: 24.6 mmol/L (ref 20.0–28.0)
Calcium, Ion: 1.15 mmol/L (ref 1.15–1.40)
HCT: 29 % — ABNORMAL LOW (ref 36.0–46.0)
Hemoglobin: 9.9 g/dL — ABNORMAL LOW (ref 12.0–15.0)
O2 Saturation: 100 %
Patient temperature: 37.9
Potassium: 4.8 mmol/L (ref 3.5–5.1)
Sodium: 138 mmol/L (ref 135–145)
TCO2: 26 mmol/L (ref 22–32)
pCO2 arterial: 45.7 mmHg (ref 32.0–48.0)
pH, Arterial: 7.343 — ABNORMAL LOW (ref 7.350–7.450)
pO2, Arterial: 189 mmHg — ABNORMAL HIGH (ref 83.0–108.0)

## 2018-11-22 LAB — CBC
HCT: 23.4 % — ABNORMAL LOW (ref 36.0–46.0)
Hemoglobin: 7.2 g/dL — ABNORMAL LOW (ref 12.0–15.0)
MCH: 29.8 pg (ref 26.0–34.0)
MCHC: 30.8 g/dL (ref 30.0–36.0)
MCV: 96.7 fL (ref 80.0–100.0)
Platelets: 165 10*3/uL (ref 150–400)
RBC: 2.42 MIL/uL — ABNORMAL LOW (ref 3.87–5.11)
RDW: 17.4 % — ABNORMAL HIGH (ref 11.5–15.5)
WBC: 8.7 10*3/uL (ref 4.0–10.5)
nRBC: 0 % (ref 0.0–0.2)

## 2018-11-22 LAB — URIC ACID: Uric Acid, Serum: 3.2 mg/dL (ref 2.5–7.1)

## 2018-11-22 LAB — IRON AND TIBC
Iron: 19 ug/dL — ABNORMAL LOW (ref 28–170)
Saturation Ratios: 13 % (ref 10.4–31.8)
TIBC: 143 ug/dL — ABNORMAL LOW (ref 250–450)
UIBC: 124 ug/dL

## 2018-11-22 LAB — PHOSPHORUS: Phosphorus: 6.7 mg/dL — ABNORMAL HIGH (ref 2.5–4.6)

## 2018-11-22 MED ORDER — CHLORHEXIDINE GLUCONATE CLOTH 2 % EX PADS
6.0000 | MEDICATED_PAD | Freq: Every day | CUTANEOUS | Status: DC
  Administered 2018-11-22 – 2018-11-23 (×2): 6 via TOPICAL

## 2018-11-22 MED ORDER — AMIODARONE HCL 200 MG PO TABS
200.0000 mg | ORAL_TABLET | Freq: Every day | ORAL | Status: DC
  Administered 2018-11-22 – 2018-11-30 (×10): 200 mg via ORAL
  Filled 2018-11-22 (×10): qty 1

## 2018-11-22 MED ORDER — PHENOL 1.4 % MT LIQD
1.0000 | OROMUCOSAL | Status: DC | PRN
  Filled 2018-11-22: qty 177

## 2018-11-22 MED ORDER — INSULIN ASPART 100 UNIT/ML ~~LOC~~ SOLN: 0.0000 [IU] | Freq: Every day | SUBCUTANEOUS | Status: DC

## 2018-11-22 MED ORDER — INSULIN ASPART 100 UNIT/ML ~~LOC~~ SOLN
0.0000 [IU] | Freq: Three times a day (TID) | SUBCUTANEOUS | Status: DC
  Administered 2018-11-23: 2 [IU] via SUBCUTANEOUS
  Administered 2018-11-23 – 2018-11-24 (×2): 1 [IU] via SUBCUTANEOUS
  Administered 2018-11-25: 2 [IU] via SUBCUTANEOUS
  Administered 2018-11-26 – 2018-11-27 (×2): 1 [IU] via SUBCUTANEOUS
  Administered 2018-11-28 – 2018-11-29 (×2): 2 [IU] via SUBCUTANEOUS

## 2018-11-22 MED ORDER — SODIUM CHLORIDE 0.9% IV SOLUTION
Freq: Once | INTRAVENOUS | Status: AC
  Administered 2018-11-22: 09:00:00 via INTRAVENOUS

## 2018-11-22 NOTE — Progress Notes (Signed)
PROGRESS NOTE    [name]  XIP:[id number] DOB: Feb 17, 1941 DOA: 11/06/2018 PCP: Doree Albee, MD   Brief Narrative:  78 year old WF PMHx  DM 2 controlled with complication, HTN, CKD stage 5, CAD s/p STEMI treated medically due to advanced CKD and anemia, gout, chronic combined systolic and diastolic CHF, Hypothyroid,   Presented to ED due to chest pain and dyspnea. She was diagnosed with NSTEMI, acute on chronic diastolic CHF, progressive CKD to ESRD. Nephrology consulted and HD initiated this admission. Cardiology consulted, cardiac cath 6/17 showed severe three-vessel disease, TCTS consulted and plan CABG after Plavix washout (last dose 11/12/2018). Patient for CABG on 11/20/2018. Stable.     Subjective: 6/27   A/O x4  negative S OB, negative CP, negative abdominal pain, negative N/V.   Assessment & Plan:   Principal Problem:   Non-ST elevation (NSTEMI) myocardial infarction [facility]) Active Problems:   HTN (hypertension)   Hyperlipidemia associated with type 2 diabetes mellitus (HCC)   Essential hypertension, benign   Hypothyroidism   ESRD on dialysis ([place])   S/P CABG x 4   CAD (coronary artery disease)   Acute on chronic diastolic CHF (congestive heart failure) (HCC)   Respiratory failure with hypoxia (HCC)   End-stage renal disease on hemodialysis ([place])   Diabetes mellitus type 2, controlled, with complications ([place])  NSTEMI/CAD -6/17 showed severe triple-vessel CAD with chronic occlusion distal RCA; cardiothoracic surgery consulted -6/17 last dose of Plavix administered will have to await Plavix washout, CABG tentatively scheduled for 6/25. -6/25 s/p CABG -Transfuse for hemoglobin<8 -6/27 transfuse 1 unit PRBC  Acute on chronic diastolic CHF -See NSTEMI -Strict in and out -448.80ml -Daily weight Filed Weights   11/21/18 1350 11/22/18 0600 11/22/18 1053  Weight: 80.5 kg 80.9 kg 82.6 kg  -Amiodarone drip - Metoprolol 12.5 mg twice daily  Essential HTN   -See CHF  Respiratory failure with hypoxia - Secondary to decompensated CHF has resolved. - Titrate O2 to maintain SPO2> 93%  ESRD (new) T/Th/Sat  - Most likely secondary to Bactrim, cardiac catheterization.  - CRRT/HD per nephrology - Patient will be on T/TH/Sat HD session upon discharge   Hyperkalemia -Corrected with CRRT  Diabetes type 2 controlled with complication - 6/73 hemoglobin A1c= 5.2 - Levemir 5 units nightly - Custom SSI  HLD - LDL not within ADA/AHA guidelines - LDL goal<70 - Patient removed CRRT will address if she can tolerate any statin tomorrow.     Hypothyroid - Thyroid ([place]) 120 mg daily  Gout - Uloric 40 mg daily -Uric acid pending  Anemia ESRD. -Hx GI bleed. -Aranesp per nephrology.    Secondary hyperparathyroidism - PhosLo 667 mg 3 times daily    DVT prophylaxis: Subcu heparin Code Status: Full Family Communication: None Disposition Plan: TBD   Consultants:  Nephrology Cardiothoracic surgery Cardiology    Procedures/Significant Events:  6/25 CABG 6/27 transfuse 1 unit PRBC    I have personally reviewed and interpreted all radiology studies and my findings are as above.  VENTILATOR SETTINGS:    Cultures   Antimicrobials: Anti-infectives (From admission, onward)   Start     Stop   11/20/18 2000  vancomycin (VANCOCIN) IVPB 1000 mg/200 mL premix     11/20/18 2137   11/20/18 2000  cefUROXime (ZINACEF) 1.5 g in sodium chloride 0.9 % 100 mL IVPB     11/21/18 2022   11/20/18 1800  cefUROXime (ZINACEF) 1.5 g in sodium chloride 0.9 % 100 mL IVPB  Status:  Discontinued  11/20/18 1542   11/20/18 0400  vancomycin (VANCOCIN) 1,250 mg in sodium chloride 0.9 % 250 mL IVPB     11/20/18 0852   11/20/18 0400  cefUROXime (ZINACEF) 1.5 g in sodium chloride 0.9 % 100 mL IVPB     11/20/18 0852   11/19/18 0930  cefUROXime (ZINACEF) 750 mg in sodium chloride 0.9 % 100 mL IVPB  Status:  Discontinued     11/20/18 0930   11/07/18  2200  ceFEPIme (MAXIPIME) 1 g in sodium chloride 0.9 % 100 mL IVPB  Status:  Discontinued     11/07/18 1227   11/07/18 1800  vancomycin (VANCOCIN) IVPB 750 mg/150 ml premix  Status:  Discontinued     11/07/18 1227   11/07/18 0745  vancomycin (VANCOCIN) 1,500 mg in sodium chloride 0.9 % 500 mL IVPB     11/07/18 1044   11/07/18 0730  ceFEPIme (MAXIPIME) 1 g in sodium chloride 0.9 % 100 mL IVPB  Status:  Discontinued     11/07/18 1209   11/07/18 0730  vancomycin (VANCOCIN) IVPB 1000 mg/200 mL premix  Status:  Discontinued     11/07/18 0735   11/06/18 2200  sulfamethoxazole-trimethoprim (BACTRIM) 400-80 MG per tablet 1 tablet  Status:  Discontinued     11/07/18 1227       Devices    LINES / TUBES:      Continuous Infusions: . sodium chloride    . amiodarone 30 mg/hr (11/22/18 0700)  . dexmedetomidine (PRECEDEX) IV infusion Stopped (11/20/18 1609)  . lactated ringers    . lactated ringers    . lactated ringers 20 mL/hr at 11/22/18 0700  . magnesium sulfate    . nitroGLYCERIN Stopped (11/20/18 1609)  . phenylephrine (NEO-SYNEPHRINE) Adult infusion Stopped (11/21/18 0610)     Objective: Vitals:   11/22/18 0500 11/22/18 0600 11/22/18 0700 11/22/18 0722  BP: (!) 121/42 (!) 120/43 (!) 126/42   Pulse: 70 70 70   Resp: 12 14 14    Temp:    97.8 F (36.6 C)  TempSrc:    Oral  SpO2: 99% 99% 96%   Weight:  80.9 kg    Height:  5\' 4"  (1.626 m)      Intake/Output Summary (Last 24 hours) at 11/22/2018 0737 Last data filed at 11/22/2018 0700 Gross per 24 hour  Intake 1164.6 ml  Output 665 ml  Net 499.6 ml   Filed Weights   11/21/18 0500 11/21/18 1350 11/22/18 0600  Weight: 80.5 kg 80.5 kg 80.9 kg   Physical Exam:  General: A/O x4 no acute respiratory distress Eyes: negative scleral hemorrhage, negative anisocoria, negative icterus ENT: Negative Runny nose, negative gingival bleeding, Neck:  Negative scars, masses, torticollis, lymphadenopathy, JVD, all caps right IJ  triple-lumen covered and clean negative bleeding or signs of infection. Lungs: Clear to auscultation bilaterally without wheezes or crackles Cardiovascular: Regular rate and rhythm without murmur gallop or rub normal S1 and S2.  Surgical incision covered by honeycomb dressing negative sign of infection or bleeding. Abdomen: negative abdominal pain, nondistended, positive soft, bowel sounds, no rebound, no ascites, no appreciable mass Extremities: No significant cyanosis, clubbing, or edema bilateral lower extremities Skin: Negative rashes, lesions, ulcers Psychiatric:  Negative depression, negative anxiety, negative fatigue, negative mania  Central nervous system:  Cranial nerves II through XII intact, tongue/uvula midline, all extremities muscle strength 5/5, sensation intact throughout,  negative dysarthria, negative expressive aphasia, negative receptive aphasia..     Data Reviewed: Care during the described time interval was  provided by me .  I have reviewed this patient's available data, including medical history, events of note, physical examination, and all test results as part of my evaluation.   CBC: Recent Labs  Lab 11/16/18 0703 11/17/18 0619 11/18/18 0446  11/20/18 0558 11/20/18 1135 11/20/18 1329  11/20/18 1846 11/20/18 1851 11/20/18 2210 11/20/18 2328 11/21/18 0400 11/22/18 0450  WBC 6.2 6.1 7.5   < > 6.7  --  12.8*  --  16.3*  --   --   --  13.7* 8.7  NEUTROABS 3.6 3.6 4.9  --   --   --   --   --   --   --   --   --   --   --   HGB 8.8* 9.1* 9.2*   < > 8.9* 7.8* 9.6*   < > 10.0* 10.2* 9.2* 9.9* 9.1* 7.2*  HCT 28.3* 28.9* 28.9*   < > 28.3* 23.9* 29.8*   < > 31.1* 30.0* 27.0* 29.0* 28.5* 23.4*  MCV 95.6 96.0 94.4   < > 94.3  --  90.9  --  92.0  --   --   --  93.1 96.7  PLT 327 330 332   < > 294 189 190  --  236  --   --   --  254 165   < > = values in this interval not displayed.   Basic Metabolic Panel: Recent Labs  Lab 11/17/18 1336  11/19/18 1320 11/20/18 0558  11/20/18 1343  11/20/18 1846 11/20/18 1851 11/20/18 2210 11/20/18 2328 11/21/18 0400 11/22/18 0350  NA 137  --  135 137 138   < >  --  137 136 138 138 136  K 4.0  --  4.1 3.5 4.1   < >  --  4.8 5.0 4.8 5.2* 4.8  CL 102  --  100 97*  --   --   --  103  --   --  105 100  CO2 21*  --  20* 26  --   --   --   --   --   --  22 23  GLUCOSE 151*  --  130* 117* 128*  --   --  115*  --   --  103* 107*  BUN 85*  --  95* 46*  --   --   --  41*  --   --  49* 45*  CREATININE 5.88*   < > 5.33* 4.01*  --   --  3.84* 3.50*  --   --  4.11* 3.90*  CALCIUM 8.5*  --  8.7* 8.8*  --   --   --   --   --   --  8.1* 8.1*  MG  --   --   --  2.1  --   --  2.3  --   --   --  2.2 2.2  PHOS 6.0*  --  5.8*  --   --   --   --   --   --   --   --  6.7*   < > = values in this interval not displayed.   GFR: Estimated Creatinine Clearance: 12 mL/min (A) (by C-G formula based on SCr of 3.9 mg/dL (H)). Liver Function Tests: Recent Labs  Lab 11/17/18 1336 11/19/18 1320  ALBUMIN 2.8* 2.9*   No results for input(s): LIPASE, AMYLASE in the last 168 hours. No results for input(s): AMMONIA in the last 168  hours. Coagulation Profile: Recent Labs  Lab 11/19/18 0903 11/20/18 1329  INR 1.0 1.4*   Cardiac Enzymes: No results for input(s): CKTOTAL, CKMB, CKMBINDEX, TROPONINI in the last 168 hours. BNP (last 3 results) No results for input(s): PROBNP in the last 8760 hours. HbA1C: Recent Labs    11/20/18 0558  HGBA1C 5.2   CBG: Recent Labs  Lab 11/21/18 1528 11/21/18 1936 11/21/18 2356 11/22/18 0349 11/22/18 0720  GLUCAP 147* 120* 110* 107* 97   Lipid Profile: Recent Labs    11/20/18 0558  CHOL 336*  HDL 40*  LDLCALC 221*  TRIG 373*  CHOLHDL 8.4   Thyroid Function Tests: No results for input(s): TSH, T4TOTAL, FREET4, T3FREE, THYROIDAB in the last 72 hours. Anemia Panel: No results for input(s): VITAMINB12, FOLATE, FERRITIN, TIBC, IRON, RETICCTPCT in the last 72 hours. Urine analysis:     Component Value Date/Time   COLORURINE YELLOW 11/19/2018 2010   APPEARANCEUR CLOUDY (A) 11/19/2018 2010   LABSPEC 1.018 11/19/2018 2010   PHURINE 5.0 11/19/2018 2010   GLUCOSEU 50 (A) 11/19/2018 2010   HGBUR NEGATIVE 11/19/2018 2010   [place] 11/19/2018 2010   KETONESUR NEGATIVE 11/19/2018 2010   PROTEINUR >=300 (A) 11/19/2018 2010   UROBILINOGEN 0.2 02/08/2015 1615   NITRITE NEGATIVE 11/19/2018 2010   LEUKOCYTESUR NEGATIVE 11/19/2018 2010   Sepsis Labs: @LABRCNTIP (procalcitonin:4,lacticidven:4)  ) Recent Results (from the past 240 hour(s))  Surgical PCR screen     Status: None   Collection Time: 11/19/18  8:40 PM   Specimen: Nasal Mucosa; Nasal Swab  Result Value Ref Range Status   MRSA, PCR NEGATIVE NEGATIVE Final   Staphylococcus aureus NEGATIVE NEGATIVE Final    Comment: (NOTE) The Xpert SA Assay (FDA approved for NASAL specimens in patients 56 years of age and older), is one component of a comprehensive surveillance program. It is not intended to diagnose infection nor to guide or monitor treatment. Performed at [facility], [place] [address], [place], [place] [address]          Radiology Studies: Dg Chest [place] 1 View  Result Date: 11/21/2018 CLINICAL DATA:  78 year old female postoperative day 1 status post CABG. EXAM: PORTABLE CHEST 1 VIEW COMPARISON:  11/20/2018 and earlier. FINDINGS: Portable AP semi upright view at 0536 hours. Extubated and enteric tube removed. Stable right IJ approach Swan-Ganz catheter, tip at the main pulmonary outflow tract. Stable bilateral chest tubes. Mildly lower lung volumes and increased lower lobe opacity greater on the left. No pneumothorax or pulmonary edema. Stable cardiac size and mediastinal contours. Negative visible bowel gas pattern. Previous left humerus ORIF. IMPRESSION: 1. Extubated and enteric tube removed. Otherwise stable lines and tubes. 2. Lower lung volumes with increased lower lobe atelectasis. No  pneumothorax or pulmonary edema. Electronically Signed   By: Genevie Ann M.D.   On: 11/21/2018 08:33   Dg Chest Port 1 View  Result Date: 11/20/2018 CLINICAL DATA:  Status post CABG EXAM: PORTABLE CHEST 1 VIEW COMPARISON:  November 19, 2018 FINDINGS: The heart size and mediastinal contours are normal. Endotracheal tube is identified with distal tip 4.2 cm from carina. Mediastinal drain, right chest tube, nasogastric tube are identified in good position. Swan-Ganz catheter is identified with distal tip over the mid heart. Both lungs are clear. There is no pneumothorax. The visualized skeletal structures are stable. IMPRESSION: Postoperative changes as described. Life supporting devices as described. There is no pneumothorax. Electronically Signed   By: Abelardo Diesel M.D.   On: 11/20/2018 14:41  Scheduled Meds: . acetaminophen  1,000 mg Oral Q6H   Or  . acetaminophen (TYLENOL) oral liquid 160 mg/5 mL  1,000 mg Per Tube Q6H  . aspirin EC  81 mg Oral Daily  . bisacodyl  10 mg Oral Daily   Or  . bisacodyl  10 mg Rectal Daily  . calcium acetate  667 mg Oral TID WC  . Chlorhexidine Gluconate Cloth  6 each Topical Q0600  . ciprofloxacin-dexamethasone  4 drop Left EAR BID  . docusate sodium  200 mg Oral Daily  . febuxostat  40 mg Oral Daily  . gabapentin  600 mg Oral QHS  . insulin aspart  0-24 Units Subcutaneous Q4H  . insulin detemir  5 Units Subcutaneous QHS  . metoprolol tartrate  12.5 mg Oral BID   Or  . metoprolol tartrate  12.5 mg Per Tube BID  . pantoprazole  40 mg Oral Daily  . sodium chloride flush  10-40 mL Intracatheter Q12H  . sodium chloride flush  3 mL Intravenous Q12H  . thyroid  120 mg Oral Q0600   Continuous Infusions: . sodium chloride    . amiodarone 30 mg/hr (11/22/18 0700)  . dexmedetomidine (PRECEDEX) IV infusion Stopped (11/20/18 1609)  . lactated ringers    . lactated ringers    . lactated ringers 20 mL/hr at 11/22/18 0700  . magnesium sulfate    .  nitroGLYCERIN Stopped (11/20/18 1609)  . phenylephrine (NEO-SYNEPHRINE) Adult infusion Stopped (11/21/18 0610)     LOS: 15 days   The patient is critically ill with multiple organ systems failure and requires high complexity decision making for assessment and support, frequent evaluation and titration of therapies, application of advanced monitoring technologies and extensive interpretation of multiple databases. Critical Care Time devoted to patient care services described in this note  Time spent: 40 minutes     , Geraldo Docker, MD [facility] Pager [phone]  If 7PM-7AM, please contact night-coverage [email or web address] Password [facility] 11/22/2018, 7:37 AM

## 2018-11-22 NOTE — Progress Notes (Signed)
[facility] Progress Note   Assessment/ Plan:    1. Severe 3 vessel CAD : sp CABG 6/25.   2.ESRD: new start on 6/12.  16 g needles.  CLIP'd to [facility] 11:30. Dry wt likely around 75kg. Last HD yesterday 6/24.  Up only 5kg post op - tolerated HD yest, short HD again today, then next on Monday - volume up 5kg postop, small UF 1.5 L today as tolerated  3. Anemia: Hgb 8.7, Got Aranesp 100 mcg 6/15, Tsat 22% 6/3 - due for esa on 6/29, have ordered darbe 100 ug - repeat Fe studies - getting 1u prbc today  4. CKD-MBD: check PTH-- 114 6/3, no VDRA, Ca/Phos OK  5. Nutrition: albumin 2.6, prostat  6. Hypertension: on metop 12.5 bid   Kelly Splinter, MD 11/22/2018, 10:55 AM   Subjective:    No new c/o's, getting blood this am for Hb 7.5 (8.5 preop and 9.1 yest)   Objective:   BP (!) 126/42   Pulse 69   Temp (!) 97.4 F (36.3 C) (Oral)   Resp 15   Ht 5\' 4"  (1.626 m)   Wt 80.9 kg   SpO2 98%   BMI 30.61 kg/m   Physical Exam: Gen: alert , nasal O2 CVS: RRR no m/r/g Resp: cta ant and lat Abd: soft nontender NABS Ext: 1+ diffuse edema bilat ACCESS: R UE AVF +T/B  Labs: BMET Recent Labs  Lab 11/17/18 1336 11/19/18 0536 11/19/18 1320 11/20/18 0558 11/20/18 1343 11/20/18 1406 11/20/18 1846 11/20/18 1851 11/20/18 2210 11/20/18 2328 11/21/18 0400 11/22/18 0350  NA 137  --  135 137 138 140  --  137 136 138 138 136  K 4.0  --  4.1 3.5 4.1 4.1  --  4.8 5.0 4.8 5.2* 4.8  CL 102  --  100 97*  --   --   --  103  --   --  105 100  CO2 21*  --  20* 26  --   --   --   --   --   --  22 23  GLUCOSE 151*  --  130* 117* 128*  --   --  115*  --   --  103* 107*  BUN 85*  --  95* 46*  --   --   --  41*  --   --  49* 45*  CREATININE 5.88* 5.54* 5.33* 4.01*  --   --  3.84* 3.50*  --   --  4.11* 3.90*  CALCIUM 8.5*  --  8.7* 8.8*  --   --   --   --   --   --  8.1* 8.1*  PHOS 6.0*  --  5.8*  --   --   --   --   --   --   --   --  6.7*   CBC Recent Labs  Lab  11/16/18 0703 11/17/18 0619 11/18/18 0446  11/20/18 1329  11/20/18 1846  11/20/18 2210 11/20/18 2328 11/21/18 0400 11/22/18 0450  WBC 6.2 6.1 7.5   < > 12.8*  --  16.3*  --   --   --  13.7* 8.7  NEUTROABS 3.6 3.6 4.9  --   --   --   --   --   --   --   --   --   HGB 8.8* 9.1* 9.2*   < > 9.6*   < > 10.0*   < >  9.2* 9.9* 9.1* 7.2*  HCT 28.3* 28.9* 28.9*   < > 29.8*   < > 31.1*   < > 27.0* 29.0* 28.5* 23.4*  MCV 95.6 96.0 94.4   < > 90.9  --  92.0  --   --   --  93.1 96.7  PLT 327 330 332   < > 190  --  236  --   --   --  254 165   < > = values in this interval not displayed.    @IMGRELPRIORS @ Medications:    . acetaminophen  1,000 mg Oral Q6H   Or  . acetaminophen (TYLENOL) oral liquid 160 mg/5 mL  1,000 mg Per Tube Q6H  . aspirin EC  81 mg Oral Daily  . bisacodyl  10 mg Oral Daily   Or  . bisacodyl  10 mg Rectal Daily  . calcium acetate  667 mg Oral TID WC  . Chlorhexidine Gluconate Cloth  6 each Topical Q0600  . ciprofloxacin-dexamethasone  4 drop Left EAR BID  . docusate sodium  200 mg Oral Daily  . febuxostat  40 mg Oral Daily  . gabapentin  600 mg Oral QHS  . insulin aspart  0-24 Units Subcutaneous Q4H  . insulin detemir  5 Units Subcutaneous QHS  . metoprolol tartrate  12.5 mg Oral BID   Or  . metoprolol tartrate  12.5 mg Per Tube BID  . pantoprazole  40 mg Oral Daily  . sodium chloride flush  10-40 mL Intracatheter Q12H  . sodium chloride flush  3 mL Intravenous Q12H  . thyroid  120 mg Oral Q0600

## 2018-11-22 NOTE — Progress Notes (Signed)
2 Days Post-Op Procedure(s) (LRB): CORONARY ARTERY BYPASS GRAFTING (CABG), ON PUMP, TIMES four, USING LEFT INTERNAL MAMMARY ARTERY AND ENDOSCOPICALLY HARVESTED RIGHT GREATER SAPHENOUS VEIN (N/A) TRANSESOPHAGEAL ECHOCARDIOGRAM (TEE) (N/A) Subjective: Patient fatigue today, had dialysis, up in chair Maintaining sinus rhythm, will transition off IV amiodarone Hemoglobin dropped to 7.4 g-patient received 1 unit packed cells with dialysis  Objective: Vital signs in last 24 hours: Temp:  [97.3 F (36.3 C)-98.4 F (36.9 C)] 98.1 F (36.7 C) (06/27 1600) Pulse Rate:  [68-123] 70 (06/27 1600) Cardiac Rhythm: Atrial paced (06/27 1600) Resp:  [9-24] 16 (06/27 1600) BP: (104-143)/(42-68) 127/46 (06/27 1500) SpO2:  [96 %-100 %] 99 % (06/27 1600) Weight:  [80.9 kg-82.6 kg] 81.9 kg (06/27 1358)  Hemodynamic parameters for last 24 hours:    Intake/Output from previous day: 06/26 0701 - 06/27 0700 In: 1164.6 [P.O.:236; I.V.:828.5; IV Piggyback:100.1] Out: 665 [Urine:145; Chest Tube:20] Intake/Output this shift: Total I/O In: 880.3 [P.O.:240; I.V.:325.3; Blood:315] Out: 1500 [Other:1500]  Alert and comfortable Sternal dressing dry intact Extremities warm  Lab Results: Recent Labs    11/21/18 0400 11/22/18 0450  WBC 13.7* 8.7  HGB 9.1* 7.2*  HCT 28.5* 23.4*  PLT 254 165   BMET:  Recent Labs    11/21/18 0400 11/22/18 0350  NA 138 136  K 5.2* 4.8  CL 105 100  CO2 22 23  GLUCOSE 103* 107*  BUN 49* 45*  CREATININE 4.11* 3.90*  CALCIUM 8.1* 8.1*    PT/INR:  Recent Labs    11/20/18 1329  LABPROT 17.1*  INR 1.4*   ABG    Component Value Date/Time   PHART 7.343 (L) 11/20/2018 2328   HCO3 24.6 11/20/2018 2328   TCO2 26 11/20/2018 2328   ACIDBASEDEF 1.0 11/20/2018 2328   O2SAT 100.0 11/20/2018 2328   CBG (last 3)  Recent Labs    11/22/18 0720 11/22/18 1125 11/22/18 1601  GLUCAP 97 173* 101*    Assessment/Plan: S/P Procedure(s) (LRB): CORONARY ARTERY BYPASS  GRAFTING (CABG), ON PUMP, TIMES four, USING LEFT INTERNAL MAMMARY ARTERY AND ENDOSCOPICALLY HARVESTED RIGHT GREATER SAPHENOUS VEIN (N/A) TRANSESOPHAGEAL ECHOCARDIOGRAM (TEE) (N/A) Continue postop care, transition off IV amiodarone Expected postoperative blood loss anemia Keep in ICU until patient is stronger   LOS: 15 days    Tharon Aquas Trigt III 11/22/2018

## 2018-11-23 ENCOUNTER — Encounter (HOSPITAL_COMMUNITY): Payer: Self-pay

## 2018-11-23 LAB — TYPE AND SCREEN
ABO/RH(D): B POS
ABO/RH(D): B POS
Antibody Screen: NEGATIVE
Antibody Screen: NEGATIVE
Unit division: 0
Unit division: 0
Unit division: 0
Unit division: 0
Unit division: 0
Unit division: 0

## 2018-11-23 LAB — MAGNESIUM: Magnesium: 2.1 mg/dL (ref 1.7–2.4)

## 2018-11-23 LAB — BPAM RBC
Blood Product Expiration Date: 202007102359
Blood Product Expiration Date: 202007142359
Blood Product Expiration Date: 202007152359
Blood Product Expiration Date: 202007152359
Blood Product Expiration Date: 202007182359
Blood Product Expiration Date: 202007192359
ISSUE DATE / TIME: 202006250722
ISSUE DATE / TIME: 202006250722
ISSUE DATE / TIME: 202006250722
ISSUE DATE / TIME: 202006270916
ISSUE DATE / TIME: 202006271713
Unit Type and Rh: 7300
Unit Type and Rh: 7300
Unit Type and Rh: 7300
Unit Type and Rh: 7300
Unit Type and Rh: 7300
Unit Type and Rh: 7300

## 2018-11-23 LAB — BASIC METABOLIC PANEL
Anion gap: 11 (ref 5–15)
BUN: 31 mg/dL — ABNORMAL HIGH (ref 8–23)
CO2: 27 mmol/L (ref 22–32)
Calcium: 8.1 mg/dL — ABNORMAL LOW (ref 8.9–10.3)
Chloride: 100 mmol/L (ref 98–111)
Creatinine, Ser: 3.37 mg/dL — ABNORMAL HIGH (ref 0.44–1.00)
GFR calc Af Amer: 14 mL/min — ABNORMAL LOW (ref >60)
GFR calc non Af Amer: 12 mL/min — ABNORMAL LOW (ref >60)
Glucose, Bld: 101 mg/dL — ABNORMAL HIGH (ref 70–99)
Potassium: 3.9 mmol/L (ref 3.5–5.1)
Sodium: 138 mmol/L (ref 135–145)

## 2018-11-23 LAB — CBC
HCT: 25.4 % — ABNORMAL LOW (ref 36.0–46.0)
Hemoglobin: 8 g/dL — ABNORMAL LOW (ref 12.0–15.0)
MCH: 30.1 pg (ref 26.0–34.0)
MCHC: 31.5 g/dL (ref 30.0–36.0)
MCV: 95.5 fL (ref 80.0–100.0)
Platelets: 165 10*3/uL (ref 150–400)
RBC: 2.66 MIL/uL — ABNORMAL LOW (ref 3.87–5.11)
RDW: 16.6 % — ABNORMAL HIGH (ref 11.5–15.5)
WBC: 6.9 10*3/uL (ref 4.0–10.5)
nRBC: 0 % (ref 0.0–0.2)

## 2018-11-23 LAB — GLUCOSE, CAPILLARY
Glucose-Capillary: 85 mg/dL (ref 70–99)
Glucose-Capillary: 106 mg/dL — ABNORMAL HIGH (ref 70–99)
Glucose-Capillary: 153 mg/dL — ABNORMAL HIGH (ref 70–99)
Glucose-Capillary: 144 mg/dL — ABNORMAL HIGH (ref 70–99)
Glucose-Capillary: 130 mg/dL — ABNORMAL HIGH (ref 70–99)

## 2018-11-23 MED ORDER — DARBEPOETIN ALFA 100 MCG/0.5ML IJ SOSY
100.0000 ug | PREFILLED_SYRINGE | INTRAMUSCULAR | Status: DC
  Administered 2018-11-24: 100 ug via SUBCUTANEOUS
  Filled 2018-11-23: qty 0.5

## 2018-11-23 MED ORDER — CHLORHEXIDINE GLUCONATE CLOTH 2 % EX PADS
6.0000 | MEDICATED_PAD | Freq: Every day | CUTANEOUS | Status: DC
  Administered 2018-11-23: 6 via TOPICAL

## 2018-11-23 NOTE — Progress Notes (Signed)
PROGRESS NOTE    [name]  PJA:[id number] DOB: Nov 21, 1940 DOA: 11/06/2018 PCP: Doree Albee, MD   Brief Narrative:  78 year old WF PMHx  DM 2 controlled with complication, HTN, CKD stage 5, CAD s/p STEMI treated medically due to advanced CKD and anemia, gout, chronic combined systolic and diastolic CHF, Hypothyroid,   Presented to ED due to chest pain and dyspnea. She was diagnosed with NSTEMI, acute on chronic diastolic CHF, progressive CKD to ESRD. Nephrology consulted and HD initiated this admission. Cardiology consulted, cardiac cath 6/17 showed severe three-vessel disease, TCTS consulted and plan CABG after Plavix washout (last dose 11/12/2018). Patient for CABG on 11/20/2018. Stable.     Subjective: 6/28 A/O x4, negative S OB, negative CP, negative abdominal pain, negative N/V.  Ambulated in hallway today became nauseous but after going back to bed subsided.   Assessment & Plan:   Principal Problem:   Non-ST elevation (NSTEMI) myocardial infarction [facility]) Active Problems:   HTN (hypertension)   Hyperlipidemia associated with type 2 diabetes mellitus (HCC)   Essential hypertension, benign   Hypothyroidism   ESRD on dialysis ([place])   S/P CABG x 4   CAD (coronary artery disease)   Acute on chronic diastolic CHF (congestive heart failure) (HCC)   Respiratory failure with hypoxia (HCC)   End-stage renal disease on hemodialysis ([place])   Diabetes mellitus type 2, controlled, with complications ([place])  NSTEMI/CAD -6/17 showed severe triple-vessel CAD with chronic occlusion distal RCA; cardiothoracic surgery consulted -6/17 last dose of Plavix administered will have to await Plavix washout, CABG tentatively scheduled for 6/25. -6/25 s/p CABG -Transfuse for hemoglobin<8 -6/27 transfuse 1 unit PRBC  Acute on chronic diastolic CHF -See NSTEMI -Strict in and out +84.6 -Daily weight Filed Weights   11/22/18 1053 11/22/18 1358 11/23/18 0600  Weight: 82.6 kg 81.9 kg 80.9  kg  -Amiodarone 200 mg daily - Metoprolol 12.5 mg BID  Essential HTN  -See CHF  Respiratory failure with hypoxia - Secondary to decompensated CHF has resolved. - Titrate O2 to maintain SPO2> 93%  ESRD (new) T/Th/Sat  - Most likely secondary to Bactrim, cardiac catheterization.  - CRRT/HD per nephrology - Patient will be on T/TH/Sat HD session upon discharge   Hyperkalemia -Corrected with CRRT  Diabetes type 2 controlled with complication - 3/41 hemoglobin A1c= 5.2 - Levemir 5 units nightly - Custom SSI  HLD - LDL not within ADA/AHA guidelines - LDL goal<70 - Patient removed CRRT will address if she can tolerate any statin tomorrow.     Hypothyroid - Thyroid ([place]) 120 mg daily  Gout - Uloric 40 mg daily -Uric acid pending  Anemia ESRD./Anemia of chronic disease. -Hx GI bleed. -Aranesp per nephrology.    Secondary hyperparathyroidism - PhosLo 667 mg 3 times daily    DVT prophylaxis: Subcu heparin Code Status: Full Family Communication: None Disposition Plan: TBD   Consultants:  Nephrology Cardiothoracic surgery Cardiology    Procedures/Significant Events:  6/25 CABG 6/27 transfuse 1 unit PRBC    I have personally reviewed and interpreted all radiology studies and my findings are as above.  VENTILATOR SETTINGS:    Cultures   Antimicrobials: Anti-infectives (From admission, onward)   Start     Stop   11/20/18 2000  vancomycin (VANCOCIN) IVPB 1000 mg/200 mL premix     11/20/18 2137   11/20/18 2000  cefUROXime (ZINACEF) 1.5 g in sodium chloride 0.9 % 100 mL IVPB     11/21/18 2022   11/20/18 1800  cefUROXime (  ZINACEF) 1.5 g in sodium chloride 0.9 % 100 mL IVPB  Status:  Discontinued     11/20/18 1542   11/20/18 0400  vancomycin (VANCOCIN) 1,250 mg in sodium chloride 0.9 % 250 mL IVPB     11/20/18 0852   11/20/18 0400  cefUROXime (ZINACEF) 1.5 g in sodium chloride 0.9 % 100 mL IVPB     11/20/18 0852   11/19/18 0930  cefUROXime (ZINACEF)  750 mg in sodium chloride 0.9 % 100 mL IVPB  Status:  Discontinued     11/20/18 0930   11/07/18 2200  ceFEPIme (MAXIPIME) 1 g in sodium chloride 0.9 % 100 mL IVPB  Status:  Discontinued     11/07/18 1227   11/07/18 1800  vancomycin (VANCOCIN) IVPB 750 mg/150 ml premix  Status:  Discontinued     11/07/18 1227   11/07/18 0745  vancomycin (VANCOCIN) 1,500 mg in sodium chloride 0.9 % 500 mL IVPB     11/07/18 1044   11/07/18 0730  ceFEPIme (MAXIPIME) 1 g in sodium chloride 0.9 % 100 mL IVPB  Status:  Discontinued     11/07/18 1209   11/07/18 0730  vancomycin (VANCOCIN) IVPB 1000 mg/200 mL premix  Status:  Discontinued     11/07/18 0735   11/06/18 2200  sulfamethoxazole-trimethoprim (BACTRIM) 400-80 MG per tablet 1 tablet  Status:  Discontinued     11/07/18 1227       Devices    LINES / TUBES:      Continuous Infusions: . sodium chloride    . dexmedetomidine (PRECEDEX) IV infusion Stopped (11/20/18 1609)  . lactated ringers    . lactated ringers    . lactated ringers Stopped (11/22/18 1718)  . magnesium sulfate    . nitroGLYCERIN Stopped (11/20/18 1609)  . phenylephrine (NEO-SYNEPHRINE) Adult infusion Stopped (11/21/18 0610)     Objective: Vitals:   11/23/18 0500 11/23/18 0600 11/23/18 0700 11/23/18 0733  BP: (!) 110/46 (!) 113/44 93/76   Pulse: 62 60 72   Resp:      Temp:    98.4 F (36.9 C)  TempSrc:    Oral  SpO2: 96% 95% 93%   Weight:  80.9 kg    Height:  5\' 4"  (1.626 m)      Intake/Output Summary (Last 24 hours) at 11/23/2018 0757 Last data filed at 11/23/2018 0700 Gross per 24 hour  Intake 1206.34 ml  Output 1540 ml  Net -333.66 ml   Filed Weights   11/22/18 1053 11/22/18 1358 11/23/18 0600  Weight: 82.6 kg 81.9 kg 80.9 kg    Physical Exam:  General: A/O x4, no acute respiratory distress Eyes: negative scleral hemorrhage, negative anisocoria, negative icterus ENT: Negative Runny nose, negative gingival bleeding, Neck:  Negative scars, masses,  torticollis, lymphadenopathy, JVD Lungs: Clear to auscultation bilaterally without wheezes or crackles Cardiovascular: Regular rate and rhythm without murmur gallop or rub normal S1 and S2, honeycomb dressing has been removed by surgery incision site healing well negative sign of infection. Abdomen: negative abdominal pain, nondistended, positive soft, bowel sounds, no rebound, no ascites, no appreciable mass Extremities: No significant cyanosis, clubbing, or edema bilateral lower extremities Skin: Negative rashes, lesions, ulcers Psychiatric:  Negative depression, negative anxiety, negative fatigue, negative mania  Central nervous system:  Cranial nerves II through XII intact, tongue/uvula midline, all extremities muscle strength 5/5, sensation intact throughout, f negative dysarthria, negative expressive aphasia, negative receptive aphasia.     Data Reviewed: Care during the described time interval was provided by  me .  I have reviewed this patient's available data, including medical history, events of note, physical examination, and all test results as part of my evaluation.   CBC: Recent Labs  Lab 11/17/18 0619 11/18/18 0446  11/20/18 1329  11/20/18 1846  11/20/18 2210 11/20/18 2328 11/21/18 0400 11/22/18 0450 11/23/18 0415  WBC 6.1 7.5   < > 12.8*  --  16.3*  --   --   --  13.7* 8.7 6.9  NEUTROABS 3.6 4.9  --   --   --   --   --   --   --   --   --   --   HGB 9.1* 9.2*   < > 9.6*   < > 10.0*   < > 9.2* 9.9* 9.1* 7.2* 8.0*  HCT 28.9* 28.9*   < > 29.8*   < > 31.1*   < > 27.0* 29.0* 28.5* 23.4* 25.4*  MCV 96.0 94.4   < > 90.9  --  92.0  --   --   --  93.1 96.7 95.5  PLT 330 332   < > 190  --  236  --   --   --  254 165 165   < > = values in this interval not displayed.   Basic Metabolic Panel: Recent Labs  Lab 11/17/18 1336  11/19/18 1320 11/20/18 0558 11/20/18 1343  11/20/18 1846 11/20/18 1851 11/20/18 2210 11/20/18 2328 11/21/18 0400 11/22/18 0350 11/23/18 0415  NA  137  --  135 137 138   < >  --  137 136 138 138 136 138  K 4.0  --  4.1 3.5 4.1   < >  --  4.8 5.0 4.8 5.2* 4.8 3.9  CL 102  --  100 97*  --   --   --  103  --   --  105 100 100  CO2 21*  --  20* 26  --   --   --   --   --   --  22 23 27   GLUCOSE 151*  --  130* 117* 128*  --   --  115*  --   --  103* 107* 101*  BUN 85*  --  95* 46*  --   --   --  41*  --   --  49* 45* 31*  CREATININE 5.88*   < > 5.33* 4.01*  --   --  3.84* 3.50*  --   --  4.11* 3.90* 3.37*  CALCIUM 8.5*  --  8.7* 8.8*  --   --   --   --   --   --  8.1* 8.1* 8.1*  MG  --   --   --  2.1  --   --  2.3  --   --   --  2.2 2.2 2.1  PHOS 6.0*  --  5.8*  --   --   --   --   --   --   --   --  6.7*  --    < > = values in this interval not displayed.   GFR: Estimated Creatinine Clearance: 13.9 mL/min (A) (by C-G formula based on SCr of 3.37 mg/dL (H)). Liver Function Tests: Recent Labs  Lab 11/17/18 1336 11/19/18 1320  ALBUMIN 2.8* 2.9*   No results for input(s): LIPASE, AMYLASE in the last 168 hours. No results for input(s): AMMONIA in the last 168 hours. Coagulation Profile: Recent Labs  Lab 11/19/18 0903 11/20/18 1329  INR 1.0 1.4*   Cardiac Enzymes: No results for input(s): CKTOTAL, CKMB, CKMBINDEX, TROPONINI in the last 168 hours. BNP (last 3 results) No results for input(s): PROBNP in the last 8760 hours. HbA1C: No results for input(s): HGBA1C in the last 72 hours. CBG: Recent Labs  Lab 11/22/18 1125 11/22/18 1601 11/22/18 2112 11/23/18 0635 11/23/18 0732  GLUCAP 173* 101* 148* 85 106*   Lipid Profile: No results for input(s): CHOL, HDL, LDLCALC, TRIG, CHOLHDL, LDLDIRECT in the last 72 hours. Thyroid Function Tests: No results for input(s): TSH, T4TOTAL, FREET4, T3FREE, THYROIDAB in the last 72 hours. Anemia Panel: Recent Labs    11/22/18 1811  TIBC 143*  IRON 19*   Urine analysis:    Component Value Date/Time   COLORURINE YELLOW 11/19/2018 2010   APPEARANCEUR CLOUDY (A) 11/19/2018 2010    LABSPEC 1.018 11/19/2018 2010   PHURINE 5.0 11/19/2018 2010   GLUCOSEU 50 (A) 11/19/2018 2010   HGBUR NEGATIVE 11/19/2018 2010   [place] 11/19/2018 2010   KETONESUR NEGATIVE 11/19/2018 2010   PROTEINUR >=300 (A) 11/19/2018 2010   UROBILINOGEN 0.2 02/08/2015 1615   NITRITE NEGATIVE 11/19/2018 2010   LEUKOCYTESUR NEGATIVE 11/19/2018 2010   Sepsis Labs: @LABRCNTIP (procalcitonin:4,lacticidven:4)  ) Recent Results (from the past 240 hour(s))  Surgical PCR screen     Status: None   Collection Time: 11/19/18  8:40 PM   Specimen: Nasal Mucosa; Nasal Swab  Result Value Ref Range Status   MRSA, PCR NEGATIVE NEGATIVE Final   Staphylococcus aureus NEGATIVE NEGATIVE Final    Comment: (NOTE) The Xpert SA Assay (FDA approved for NASAL specimens in patients 62 years of age and older), is one component of a comprehensive surveillance program. It is not intended to diagnose infection nor to guide or monitor treatment. Performed at [facility], [place] [address], [place], [place] [address]          Radiology Studies: Dg Chest Port 1 View  Result Date: 11/22/2018 CLINICAL DATA:  CABG EXAM: PORTABLE CHEST 1 VIEW COMPARISON:  Yesterday FINDINGS: Cardiopericardial enlargement. Chest tubes have been removed without pneumothorax. Swan-Ganz catheter has been removed with stable sheath positioning. Stable cardiopericardial enlargement after CABG. Small pleural effusions with atelectasis. No pulmonary edema IMPRESSION: 1. No visible pneumothorax after chest tube removal. 2. Stable cardiomegaly, small effusions, and atelectasis. Electronically Signed   By: Monte Fantasia M.D.   On: 11/22/2018 07:42        Scheduled Meds: . acetaminophen  1,000 mg Oral Q6H   Or  . acetaminophen (TYLENOL) oral liquid 160 mg/5 mL  1,000 mg Per Tube Q6H  . amiodarone  200 mg Oral Daily  . aspirin EC  81 mg Oral Daily  . bisacodyl  10 mg Oral Daily   Or  . bisacodyl  10 mg Rectal Daily  .  calcium acetate  667 mg Oral TID WC  . Chlorhexidine Gluconate Cloth  6 each Topical Q0600  . ciprofloxacin-dexamethasone  4 drop Left EAR BID  . docusate sodium  200 mg Oral Daily  . febuxostat  40 mg Oral Daily  . gabapentin  600 mg Oral QHS  . insulin aspart  0-5 Units Subcutaneous QHS  . insulin aspart  0-9 Units Subcutaneous TID WC  . insulin detemir  5 Units Subcutaneous QHS  . metoprolol tartrate  12.5 mg Oral BID   Or  . metoprolol tartrate  12.5 mg Per Tube BID  . pantoprazole  40 mg  Oral Daily  . sodium chloride flush  10-40 mL Intracatheter Q12H  . sodium chloride flush  3 mL Intravenous Q12H  . thyroid  120 mg Oral Q0600   Continuous Infusions: . sodium chloride    . dexmedetomidine (PRECEDEX) IV infusion Stopped (11/20/18 1609)  . lactated ringers    . lactated ringers    . lactated ringers Stopped (11/22/18 1718)  . magnesium sulfate    . nitroGLYCERIN Stopped (11/20/18 1609)  . phenylephrine (NEO-SYNEPHRINE) Adult infusion Stopped (11/21/18 0610)     LOS: 16 days   The patient is critically ill with multiple organ systems failure and requires high complexity decision making for assessment and support, frequent evaluation and titration of therapies, application of advanced monitoring technologies and extensive interpretation of multiple databases. Critical Care Time devoted to patient care services described in this note  Time spent: 40 minutes     Pauletta Pickney, Geraldo Docker, MD [facility] Pager [phone]  If 7PM-7AM, please contact night-coverage [email or web address] Password [facility] 11/23/2018, 7:57 AM

## 2018-11-23 NOTE — Progress Notes (Signed)
[facility] Progress Note   Assessment/ Plan:    1. Severe 3 vessel CAD : sp CABG 6/25  2.ESRD: new start on 6/12.  16 g needles.  CLIP is [name] TTS - post-op had HD Fri and Sat short - short HD tomorrow then TTS sched to match CLIP - volume up 5kg , UF 2L Monday as tol  3. Anemia: Hgb 8.0, Got Aranesp 100 mcg 6/15, Tsat 22% 6/3 -  darbe 100 ug on 6/29 ordered - low tsat 13% > IV fe ordered w/ HD TTS - sp PRBC x 1 Sat  4. CKD-MBD: check PTH-- 114 6/3, no VDRA, Ca/Phos OK  5. Nutrition: albumin 2.6, prostat  6. Hypertension: on metop 12.5 bid    Kelly Splinter, MD 11/23/2018, 9:20 AM   Subjective:   Up in chair, tired   Objective:   BP (!) 117/41   Pulse 64   Temp 98.4 F (36.9 C) (Oral)   Resp 14   Ht 5\' 4"  (1.626 m)   Wt 80.9 kg   SpO2 97%   BMI 30.61 kg/m   Physical Exam: Gen: alert , nasal O2 CVS: RRR no m/r/g Resp: cta ant and lat Abd: soft nontender NABS Ext: 1+ diffuse edema bilat ACCESS: R UE AVF +T/B  Labs: BMET Recent Labs  Lab 11/17/18 1336  11/19/18 1320 11/20/18 0558 11/20/18 1343 11/20/18 1406 11/20/18 1846 11/20/18 1851 11/20/18 2210 11/20/18 2328 11/21/18 0400 11/22/18 0350 11/23/18 0415  NA 137  --  135 137 138 140  --  137 136 138 138 136 138  K 4.0  --  4.1 3.5 4.1 4.1  --  4.8 5.0 4.8 5.2* 4.8 3.9  CL 102  --  100 97*  --   --   --  103  --   --  105 100 100  CO2 21*  --  20* 26  --   --   --   --   --   --  22 23 27   GLUCOSE 151*  --  130* 117* 128*  --   --  115*  --   --  103* 107* 101*  BUN 85*  --  95* 46*  --   --   --  41*  --   --  49* 45* 31*  CREATININE 5.88*   < > 5.33* 4.01*  --   --  3.84* 3.50*  --   --  4.11* 3.90* 3.37*  CALCIUM 8.5*  --  8.7* 8.8*  --   --   --   --   --   --  8.1* 8.1* 8.1*  PHOS 6.0*  --  5.8*  --   --   --   --   --   --   --   --  6.7*  --    < > = values in this interval not displayed.   CBC Recent Labs  Lab 11/17/18 0619 11/18/18 0446  11/20/18 1846   11/20/18 2328 11/21/18 0400 11/22/18 0450 11/23/18 0415  WBC 6.1 7.5   < > 16.3*  --   --  13.7* 8.7 6.9  NEUTROABS 3.6 4.9  --   --   --   --   --   --   --   HGB 9.1* 9.2*   < > 10.0*   < > 9.9* 9.1* 7.2* 8.0*  HCT 28.9* 28.9*   < > 31.1*   < > 29.0* 28.5*  23.4* 25.4*  MCV 96.0 94.4   < > 92.0  --   --  93.1 96.7 95.5  PLT 330 332   < > 236  --   --  254 165 165   < > = values in this interval not displayed.    @IMGRELPRIORS @ Medications:    . acetaminophen  1,000 mg Oral Q6H   Or  . acetaminophen (TYLENOL) oral liquid 160 mg/5 mL  1,000 mg Per Tube Q6H  . amiodarone  200 mg Oral Daily  . aspirin EC  81 mg Oral Daily  . bisacodyl  10 mg Oral Daily   Or  . bisacodyl  10 mg Rectal Daily  . calcium acetate  667 mg Oral TID WC  . Chlorhexidine Gluconate Cloth  6 each Topical Q0600  . ciprofloxacin-dexamethasone  4 drop Left EAR BID  . docusate sodium  200 mg Oral Daily  . febuxostat  40 mg Oral Daily  . gabapentin  600 mg Oral QHS  . insulin aspart  0-5 Units Subcutaneous QHS  . insulin aspart  0-9 Units Subcutaneous TID WC  . insulin detemir  5 Units Subcutaneous QHS  . metoprolol tartrate  12.5 mg Oral BID   Or  . metoprolol tartrate  12.5 mg Per Tube BID  . pantoprazole  40 mg Oral Daily  . sodium chloride flush  10-40 mL Intracatheter Q12H  . sodium chloride flush  3 mL Intravenous Q12H  . thyroid  120 mg Oral Q0600

## 2018-11-23 NOTE — Progress Notes (Signed)
Afebrile. Neuro intact, denies pain. SBP >100, MAP >60. 2L nasal cannula, desat to 87 on room air. IS/ambulation promoted. Fine crackles in bases bilaterally. SB 50's-NSR 60's. Incisions well approximated. Wires grounded x4. Tol diet, minimal insulin coverage. No bm this shift. UOP 148ml. Up to chair and ambulating in halls with four wheel walker.

## 2018-11-23 NOTE — Progress Notes (Addendum)
3 Days Post-Op Procedure(s) (LRB): CORONARY ARTERY BYPASS GRAFTING (CABG), ON PUMP, TIMES four, USING LEFT INTERNAL MAMMARY ARTERY AND ENDOSCOPICALLY HARVESTED RIGHT GREATER SAPHENOUS VEIN (N/A) TRANSESOPHAGEAL ECHOCARDIOGRAM (TEE) (N/A) Subjective: Improved energy today Maintaining sinus rhythm HD ultrafiltration planned for tomorrow Hemoglobin 8.0 g  Objective: Vital signs in last 24 hours: Temp:  [97.9 F (36.6 C)-98.5 F (36.9 C)] 97.9 F (36.6 C) (06/28 1100) Pulse Rate:  [56-72] 59 (06/28 1300) Cardiac Rhythm: Normal sinus rhythm (06/28 1200) Resp:  [11-16] 14 (06/27 2200) BP: (93-145)/(39-76) 128/41 (06/28 1319) SpO2:  [93 %-99 %] 97 % (06/28 1300) Weight:  [80.9 kg] 80.9 kg (06/28 0600)  Hemodynamic parameters for last 24 hours:    Intake/Output from previous day: 06/27 0701 - 06/28 0700 In: 1206.3 [P.O.:540; I.V.:351.3; Blood:315] Out: 1540 [Urine:40] Intake/Output this shift: Total I/O In: 360 [P.O.:360] Out: 150 [Urine:150]       Exam    General- alert and comfortable    Neck- no JVD, no cervical adenopathy palpable, no carotid bruit   Lungs- clear without rales, wheezes   Cor- regular rate and rhythm, no murmur , gallop   Abdomen- soft, non-tender   Extremities - warm, non-tender, minimal edema   Neuro- oriented, appropriate, no focal weakness   Lab Results: Recent Labs    11/22/18 0450 11/23/18 0415  WBC 8.7 6.9  HGB 7.2* 8.0*  HCT 23.4* 25.4*  PLT 165 165   BMET:  Recent Labs    11/22/18 0350 11/23/18 0415  NA 136 138  K 4.8 3.9  CL 100 100  CO2 23 27  GLUCOSE 107* 101*  BUN 45* 31*  CREATININE 3.90* 3.37*  CALCIUM 8.1* 8.1*    PT/INR: No results for input(s): LABPROT, INR in the last 72 hours. ABG    Component Value Date/Time   PHART 7.343 (L) 11/20/2018 2328   HCO3 24.6 11/20/2018 2328   TCO2 26 11/20/2018 2328   ACIDBASEDEF 1.0 11/20/2018 2328   O2SAT 100.0 11/20/2018 2328   CBG (last 3)  Recent Labs    11/23/18 0635  11/23/18 0732 11/23/18 1126  GLUCAP 85 106* 153*    Assessment/Plan: S/P Procedure(s) (LRB): CORONARY ARTERY BYPASS GRAFTING (CABG), ON PUMP, TIMES four, USING LEFT INTERNAL MAMMARY ARTERY AND ENDOSCOPICALLY HARVESTED RIGHT GREATER SAPHENOUS VEIN (N/A) TRANSESOPHAGEAL ECHOCARDIOGRAM (TEE) (N/A) Mobilize Keep patient in ICU today Plan tx to 4E tomorrow after HD ultrafiltration  LOS: 16 days    [name] 11/23/2018

## 2018-11-24 ENCOUNTER — Inpatient Hospital Stay (HOSPITAL_COMMUNITY): Payer: Medicare HMO

## 2018-11-24 DIAGNOSIS — L8990 Pressure ulcer of unspecified site, unspecified stage: Secondary | ICD-10-CM | POA: Insufficient documentation

## 2018-11-24 LAB — CBC
HCT: 26.5 % — ABNORMAL LOW (ref 36.0–46.0)
Hemoglobin: 8 g/dL — ABNORMAL LOW (ref 12.0–15.0)
MCH: 29.7 pg (ref 26.0–34.0)
MCHC: 30.2 g/dL (ref 30.0–36.0)
MCV: 98.5 fL (ref 80.0–100.0)
Platelets: 185 10*3/uL (ref 150–400)
RBC: 2.69 MIL/uL — ABNORMAL LOW (ref 3.87–5.11)
RDW: 16.8 % — ABNORMAL HIGH (ref 11.5–15.5)
WBC: 6.2 10*3/uL (ref 4.0–10.5)
nRBC: 0 % (ref 0.0–0.2)

## 2018-11-24 LAB — BASIC METABOLIC PANEL
Anion gap: 12 (ref 5–15)
BUN: 48 mg/dL — ABNORMAL HIGH (ref 8–23)
CO2: 26 mmol/L (ref 22–32)
Calcium: 8.2 mg/dL — ABNORMAL LOW (ref 8.9–10.3)
Chloride: 99 mmol/L (ref 98–111)
Creatinine, Ser: 4.81 mg/dL — ABNORMAL HIGH (ref 0.44–1.00)
GFR calc Af Amer: 9 mL/min — ABNORMAL LOW (ref >60)
GFR calc non Af Amer: 8 mL/min — ABNORMAL LOW (ref >60)
Glucose, Bld: 111 mg/dL — ABNORMAL HIGH (ref 70–99)
Potassium: 4.1 mmol/L (ref 3.5–5.1)
Sodium: 137 mmol/L (ref 135–145)

## 2018-11-24 LAB — GLUCOSE, CAPILLARY
Glucose-Capillary: 128 mg/dL — ABNORMAL HIGH (ref 70–99)
Glucose-Capillary: 119 mg/dL — ABNORMAL HIGH (ref 70–99)
Glucose-Capillary: 122 mg/dL — ABNORMAL HIGH (ref 70–99)
Glucose-Capillary: 117 mg/dL — ABNORMAL HIGH (ref 70–99)

## 2018-11-24 LAB — MAGNESIUM: Magnesium: 2.2 mg/dL (ref 1.7–2.4)

## 2018-11-24 MED ORDER — CHLORHEXIDINE GLUCONATE CLOTH 2 % EX PADS
6.0000 | MEDICATED_PAD | Freq: Every day | CUTANEOUS | Status: DC
  Administered 2018-11-24 – 2018-11-29 (×5): 6 via TOPICAL

## 2018-11-24 MED FILL — Sodium Bicarbonate IV Soln 8.4%: INTRAVENOUS | Qty: 100 | Status: AC

## 2018-11-24 MED FILL — Heparin Sodium (Porcine) Inj 1000 Unit/ML: INTRAMUSCULAR | Qty: 10 | Status: AC

## 2018-11-24 MED FILL — Electrolyte-R (PH 7.4) Solution: INTRAVENOUS | Qty: 3000 | Status: AC

## 2018-11-24 MED FILL — Lidocaine HCl Local Soln Prefilled Syringe 100 MG/5ML (2%): INTRAMUSCULAR | Qty: 10 | Status: AC

## 2018-11-24 MED FILL — Mannitol IV Soln 20%: INTRAVENOUS | Qty: 500 | Status: AC

## 2018-11-24 MED FILL — Sodium Chloride IV Soln 0.9%: INTRAVENOUS | Qty: 2000 | Status: AC

## 2018-11-24 NOTE — TOC Transition Note (Signed)
Transition of Care [facility]) - CM/SW Discharge Note   Patient Details  Name: SONG MYRE MRN: [id number] Date of Birth: 14-Apr-1941  [facility]) CM/SW Contact:  Midge Minium RN, BSN, NCM-BC, ACM-RN [phone] Phone Number: 11/24/2018, 3:30 PM   Clinical Narrative:    CM following for transitional needs. Patient is s/p CABG 6/25 and progressing well. CM team will continue to follow.      Barriers to Discharge: Continued Medical Work up    Discharge Plan and Services In-house Referral: Clinical Social Work   Post Acute Care Choice: Dialysis           Social Determinants of Health (SDOH) Interventions     Readmission Risk Interventions Readmission Risk Prevention Plan 11/11/2018  Transportation Screening Complete  Social Work Consult for [place] Planning/Counseling Complete  Palliative Care Screening Not Applicable  Some recent data might be hidden

## 2018-11-24 NOTE — Progress Notes (Addendum)
Pt vomited up 1000 am pills. States she ate chicken salad for dinner and has been nauseous since. Unable to keep anything down. Will ask pharmacy to re-time amiodarone.

## 2018-11-24 NOTE — Progress Notes (Signed)
PROGRESS NOTE    [name]  IWP:[id number] DOB: [birth year] DOA: 11/06/2018 PCP: Doree Albee, MD   Brief Narrative:  78 year old WF PMHx  DM 2 controlled with complication, HTN, CKD stage 5, CAD s/p STEMI treated medically due to advanced CKD and anemia, gout, chronic combined systolic and diastolic CHF, Hypothyroid,   Presented to ED due to chest pain and dyspnea. She was diagnosed with NSTEMI, acute on chronic diastolic CHF, progressive CKD to ESRD. Nephrology consulted and HD initiated this admission. Cardiology consulted, cardiac cath 6/17 showed severe three-vessel disease, TCTS consulted and plan CABG after Plavix washout (last dose 11/12/2018). Patient for CABG on 11/20/2018. Stable.     Subjective: 6/29 A/O x4, negative S OB, negative CP negative abdominal pain.,  States walked all the way around nurses station so now fatigue.   Assessment & Plan:   Principal Problem:   Non-ST elevation (NSTEMI) myocardial infarction [facility]) Active Problems:   HTN (hypertension)   Hyperlipidemia associated with type 2 diabetes mellitus (HCC)   Essential hypertension, benign   Hypothyroidism   ESRD on dialysis ([place])   S/P CABG x 4   CAD (coronary artery disease)   Acute on chronic diastolic CHF (congestive heart failure) (HCC)   Respiratory failure with hypoxia (HCC)   End-stage renal disease on hemodialysis ([place])   Diabetes mellitus type 2, controlled, with complications ([place])  NSTEMI/CAD -6/17 showed severe triple-vessel CAD with chronic occlusion distal RCA; cardiothoracic surgery consulted -6/17 last dose of Plavix administered will have to await Plavix washout, CABG tentatively scheduled for 6/25. -6/25 s/p CABG -Transfuse for hemoglobin<8 -6/27 transfuse 1 unit PRBC  Acute on chronic diastolic CHF -See NSTEMI -Strict in and out +684.82ml -Daily weight Filed Weights   11/22/18 1358 11/23/18 0600 11/24/18 0600  Weight: 81.9 kg 80.9 kg 79.6 kg  -Amiodarone 200 mg daily  - Metoprolol 12.5 mg BID  Essential HTN  -See CHF  Respiratory failure with hypoxia - Secondary to decompensated CHF has resolved. - Titrate O2 to maintain SPO2> 93%  ESRD (new) T/Th/Sat  - Most likely secondary to Bactrim, cardiac catheterization.  - CRRT/HD per nephrology - Patient will be on T/TH/Sat HD session upon discharge   Hyperkalemia -Corrected with CRRT  Diabetes type 2 controlled with complication - 5/05 hemoglobin A1c= 5.2 - Levemir 5 units nightly - Custom SSI  HLD - LDL not within ADA/AHA guidelines - LDL goal<70 - Patient removed CRRT will address if she can tolerate any statin tomorrow.     Hypothyroid - Thyroid ([place]) 120 mg daily  Gout - Uloric 40 mg daily -Uric acid pending  Anemia ESRD./Anemia of chronic disease. -Hx GI bleed. -Aranesp per nephrology.    Secondary hyperparathyroidism - PhosLo 667 mg 3 times daily    DVT prophylaxis: Subcu heparin Code Status: Full Family Communication: None Disposition Plan: TBD   Consultants:  Nephrology Cardiothoracic surgery Cardiology    Procedures/Significant Events:  6/25 CABG 6/27 transfuse 1 unit PRBC    I have personally reviewed and interpreted all radiology studies and my findings are as above.  VENTILATOR SETTINGS:    Cultures   Antimicrobials: Anti-infectives (From admission, onward)   Start     Stop   11/20/18 2000  vancomycin (VANCOCIN) IVPB 1000 mg/200 mL premix     11/20/18 2137   11/20/18 2000  cefUROXime (ZINACEF) 1.5 g in sodium chloride 0.9 % 100 mL IVPB     11/21/18 2022   11/20/18 1800  cefUROXime (ZINACEF) 1.5 g in  sodium chloride 0.9 % 100 mL IVPB  Status:  Discontinued     11/20/18 1542   11/20/18 0400  vancomycin (VANCOCIN) 1,250 mg in sodium chloride 0.9 % 250 mL IVPB     11/20/18 0852   11/20/18 0400  cefUROXime (ZINACEF) 1.5 g in sodium chloride 0.9 % 100 mL IVPB     11/20/18 0852   11/19/18 0930  cefUROXime (ZINACEF) 750 mg in sodium chloride 0.9  % 100 mL IVPB  Status:  Discontinued     11/20/18 0930   11/07/18 2200  ceFEPIme (MAXIPIME) 1 g in sodium chloride 0.9 % 100 mL IVPB  Status:  Discontinued     11/07/18 1227   11/07/18 1800  vancomycin (VANCOCIN) IVPB 750 mg/150 ml premix  Status:  Discontinued     11/07/18 1227   11/07/18 0745  vancomycin (VANCOCIN) 1,500 mg in sodium chloride 0.9 % 500 mL IVPB     11/07/18 1044   11/07/18 0730  ceFEPIme (MAXIPIME) 1 g in sodium chloride 0.9 % 100 mL IVPB  Status:  Discontinued     11/07/18 1209   11/07/18 0730  vancomycin (VANCOCIN) IVPB 1000 mg/200 mL premix  Status:  Discontinued     11/07/18 0735   11/06/18 2200  sulfamethoxazole-trimethoprim (BACTRIM) 400-80 MG per tablet 1 tablet  Status:  Discontinued     11/07/18 1227       Devices    LINES / TUBES:      Continuous Infusions: . sodium chloride    . lactated ringers       Objective: Vitals:   11/24/18 0650 11/24/18 0700 11/24/18 0800 11/24/18 0837  BP:  (!) 117/41 (!) 129/47   Pulse:  61 63 (!) 58  Resp:  (!) 8 14 (!) 8  Temp: 98.3 F (36.8 C)     TempSrc: Oral     SpO2:  97% 98% 97%  Weight:      Height:        Intake/Output Summary (Last 24 hours) at 11/24/2018 0846 Last data filed at 11/24/2018 0600 Gross per 24 hour  Intake 860 ml  Output 150 ml  Net 710 ml   Filed Weights   11/22/18 1358 11/23/18 0600 11/24/18 0600  Weight: 81.9 kg 80.9 kg 79.6 kg   Physical Exam:  General: A/O x4, no acute respiratory distress Eyes: negative scleral hemorrhage, negative anisocoria, negative icterus ENT: Negative Runny nose, negative gingival bleeding, Neck:  Negative scars, masses, torticollis, lymphadenopathy, JVD Lungs: Clear to auscultation bilaterally without wheezes or crackles Cardiovascular: Regular rate and rhythm without murmur gallop or rub normal S1 and S2 Abdomen: negative abdominal pain, nondistended, positive soft, bowel sounds, no rebound, no ascites, no appreciable mass Extremities: No  significant cyanosis, clubbing, or edema bilateral lower extremities Skin: Negative rashes, lesions, ulcers Psychiatric:  Negative depression, negative anxiety, negative fatigue, negative mania  Central nervous system:  Cranial nerves II through XII intact, tongue/uvula midline, all extremities muscle strength 5/5, sensation intact throughout, negative dysarthria, negative expressive aphasia, negative receptive aphasia.   Data Reviewed: Care during the described time interval was provided by me .  I have reviewed this patient's available data, including medical history, events of note, physical examination, and all test results as part of my evaluation.   CBC: Recent Labs  Lab 11/18/18 0446  11/20/18 1846  11/20/18 2328 11/21/18 0400 11/22/18 0450 11/23/18 0415 11/24/18 0527  WBC 7.5   < > 16.3*  --   --  13.7* 8.7 6.9  6.2  NEUTROABS 4.9  --   --   --   --   --   --   --   --   HGB 9.2*   < > 10.0*   < > 9.9* 9.1* 7.2* 8.0* 8.0*  HCT 28.9*   < > 31.1*   < > 29.0* 28.5* 23.4* 25.4* 26.5*  MCV 94.4   < > 92.0  --   --  93.1 96.7 95.5 98.5  PLT 332   < > 236  --   --  254 165 165 185   < > = values in this interval not displayed.   Basic Metabolic Panel: Recent Labs  Lab 11/17/18 1336  11/19/18 1320  11/20/18 0558  11/20/18 1846 11/20/18 1851  11/20/18 2328 11/21/18 0400 11/22/18 0350 11/23/18 0415 11/24/18 0527  NA 137  --  135  --  137   < >  --  137   < > 138 138 136 138 137  K 4.0  --  4.1  --  3.5   < >  --  4.8   < > 4.8 5.2* 4.8 3.9 4.1  CL 102  --  100  --  97*  --   --  103  --   --  105 100 100 99  CO2 21*  --  20*  --  26  --   --   --   --   --  22 23 27 26   GLUCOSE 151*  --  130*  --  117*   < >  --  115*  --   --  103* 107* 101* 111*  BUN 85*  --  95*  --  46*  --   --  41*  --   --  49* 45* 31* 48*  CREATININE 5.88*   < > 5.33*  --  4.01*  --  3.84* 3.50*  --   --  4.11* 3.90* 3.37* 4.81*  CALCIUM 8.5*  --  8.7*  --  8.8*  --   --   --   --   --  8.1* 8.1*  8.1* 8.2*  MG  --   --   --    < > 2.1  --  2.3  --   --   --  2.2 2.2 2.1 2.2  PHOS 6.0*  --  5.8*  --   --   --   --   --   --   --   --  6.7*  --   --    < > = values in this interval not displayed.   GFR: Estimated Creatinine Clearance: 9.7 mL/min (A) (by C-G formula based on SCr of 4.81 mg/dL (H)). Liver Function Tests: Recent Labs  Lab 11/17/18 1336 11/19/18 1320  ALBUMIN 2.8* 2.9*   No results for input(s): LIPASE, AMYLASE in the last 168 hours. No results for input(s): AMMONIA in the last 168 hours. Coagulation Profile: Recent Labs  Lab 11/19/18 0903 11/20/18 1329  INR 1.0 1.4*   Cardiac Enzymes: No results for input(s): CKTOTAL, CKMB, CKMBINDEX, TROPONINI in the last 168 hours. BNP (last 3 results) No results for input(s): PROBNP in the last 8760 hours. HbA1C: No results for input(s): HGBA1C in the last 72 hours. CBG: Recent Labs  Lab 11/23/18 0732 11/23/18 1126 11/23/18 1542 11/23/18 2150 11/24/18 0649  GLUCAP 106* 153* 144* 130* 128*   Lipid Profile: No results for input(s): CHOL, HDL, LDLCALC, TRIG,  CHOLHDL, LDLDIRECT in the last 72 hours. Thyroid Function Tests: No results for input(s): TSH, T4TOTAL, FREET4, T3FREE, THYROIDAB in the last 72 hours. Anemia Panel: Recent Labs    11/22/18 1811  TIBC 143*  IRON 19*   Urine analysis:    Component Value Date/Time   COLORURINE YELLOW 11/19/2018 2010   APPEARANCEUR CLOUDY (A) 11/19/2018 2010   LABSPEC 1.018 11/19/2018 2010   PHURINE 5.0 11/19/2018 2010   GLUCOSEU 50 (A) 11/19/2018 2010   HGBUR NEGATIVE 11/19/2018 2010   [place] 11/19/2018 2010   KETONESUR NEGATIVE 11/19/2018 2010   PROTEINUR >=300 (A) 11/19/2018 2010   UROBILINOGEN 0.2 02/08/2015 1615   NITRITE NEGATIVE 11/19/2018 2010   LEUKOCYTESUR NEGATIVE 11/19/2018 2010   Sepsis Labs: @LABRCNTIP (procalcitonin:4,lacticidven:4)  ) Recent Results (from the past 240 hour(s))  Surgical PCR screen     Status: None   Collection  Time: 11/19/18  8:40 PM   Specimen: Nasal Mucosa; Nasal Swab  Result Value Ref Range Status   MRSA, PCR NEGATIVE NEGATIVE Final   Staphylococcus aureus NEGATIVE NEGATIVE Final    Comment: (NOTE) The Xpert SA Assay (FDA approved for NASAL specimens in patients 33 years of age and older), is one component of a comprehensive surveillance program. It is not intended to diagnose infection nor to guide or monitor treatment. Performed at [facility], [place] [address], [place],  [address]          Radiology Studies: Dg Chest Port 1 View  Result Date: 11/24/2018 CLINICAL DATA:  Sore chest.  CABG. EXAM: PORTABLE CHEST 1 VIEW COMPARISON:  11/22/2018. FINDINGS: Surgical clips lower neck. Right IJ sheath noted in stable position. Pacing wires noted over the right chest. Prior CABG. Cardiomegaly. Prominent left base atelectasis. Bibasilar infiltrates. Small left pleural effusion. No pneumothorax. IMPRESSION: 1.  Right IJ sheath in stable position. 2. Prominent left base atelectasis. Bibasilar infiltrates. Small left pleural effusion. 3.  Prior CABG.  Stable cardiomegaly. Electronically Signed   By: Marcello Moores  Register   On: 11/24/2018 06:29        Scheduled Meds: . acetaminophen  1,000 mg Oral Q6H   Or  . acetaminophen (TYLENOL) oral liquid 160 mg/5 mL  1,000 mg Per Tube Q6H  . amiodarone  200 mg Oral Daily  . aspirin EC  81 mg Oral Daily  . bisacodyl  10 mg Oral Daily   Or  . bisacodyl  10 mg Rectal Daily  . calcium acetate  667 mg Oral TID WC  . Chlorhexidine Gluconate Cloth  6 each Topical Q0600  . Chlorhexidine Gluconate Cloth  6 each Topical Q0600  . Chlorhexidine Gluconate Cloth  6 each Topical Q0600  . ciprofloxacin-dexamethasone  4 drop Left EAR BID  . darbepoetin (ARANESP) injection - NON-DIALYSIS  100 mcg Subcutaneous Q Mon-1800  . docusate sodium  200 mg Oral Daily  . febuxostat  40 mg Oral Daily  . gabapentin  600 mg Oral QHS  . insulin aspart  0-5 Units  Subcutaneous QHS  . insulin aspart  0-9 Units Subcutaneous TID WC  . insulin detemir  5 Units Subcutaneous QHS  . metoprolol tartrate  12.5 mg Oral BID   Or  . metoprolol tartrate  12.5 mg Per Tube BID  . pantoprazole  40 mg Oral Daily  . sodium chloride flush  10-40 mL Intracatheter Q12H  . sodium chloride flush  3 mL Intravenous Q12H  . thyroid  120 mg Oral Q0600   Continuous Infusions: . sodium chloride    .  lactated ringers       LOS: 17 days   The patient is critically ill with multiple organ systems failure and requires high complexity decision making for assessment and support, frequent evaluation and titration of therapies, application of advanced monitoring technologies and extensive interpretation of multiple databases. Critical Care Time devoted to patient care services described in this note  Time spent: 40 minutes     WOODS, Geraldo Docker, MD [facility] Pager [phone]  If 7PM-7AM, please contact night-coverage [email or web address] Password [facility] 11/24/2018, 8:46 AM

## 2018-11-24 NOTE — Progress Notes (Addendum)
TCTS DAILY ICU PROGRESS NOTE                   [address]            [place] [address]          [phone]   4 Days Post-Op Procedure(s) (LRB): CORONARY ARTERY BYPASS GRAFTING (CABG), ON PUMP, TIMES four, USING LEFT INTERNAL MAMMARY ARTERY AND ENDOSCOPICALLY HARVESTED RIGHT GREATER SAPHENOUS VEIN (N/A) TRANSESOPHAGEAL ECHOCARDIOGRAM (TEE) (N/A)  Total Length of Stay:  LOS: 17 days   Subjective: Resting in bed after walking in the hall earlier this AM. Nausea with vomiting x 1 this AM, denies abdominal pain. Feels better now.  Objective: Vital signs in last 24 hours: Temp:  [97.9 F (36.6 C)-98.3 F (36.8 C)] 98.3 F (36.8 C) (06/29 0650) Pulse Rate:  [30-73] 64 (06/29 1000) Cardiac Rhythm: Normal sinus rhythm (06/29 0800) Resp:  [8-28] 17 (06/29 1000) BP: (105-137)/(35-48) 129/47 (06/29 0800) SpO2:  [94 %-99 %] 96 % (06/29 1000) Weight:  [79.6 kg] 79.6 kg (06/29 0600)  Filed Weights   11/22/18 1358 11/23/18 0600 11/24/18 0600  Weight: 81.9 kg 80.9 kg 79.6 kg    Weight change: -3 kg      Intake/Output from previous day: 06/28 0701 - 06/29 0700 In: 860 [P.O.:840; I.V.:20] Out: 150 [Urine:150]  Intake/Output this shift: No intake/output data recorded.  Current Meds: Scheduled Meds: . acetaminophen  1,000 mg Oral Q6H   Or  . acetaminophen (TYLENOL) oral liquid 160 mg/5 mL  1,000 mg Per Tube Q6H  . amiodarone  200 mg Oral Daily  . aspirin EC  81 mg Oral Daily  . bisacodyl  10 mg Oral Daily   Or  . bisacodyl  10 mg Rectal Daily  . calcium acetate  667 mg Oral TID WC  . Chlorhexidine Gluconate Cloth  6 each Topical Q0600  . ciprofloxacin-dexamethasone  4 drop Left EAR BID  . darbepoetin (ARANESP) injection - NON-DIALYSIS  100 mcg Subcutaneous Q Mon-1800  . docusate sodium  200 mg Oral Daily  . febuxostat  40 mg Oral Daily  . gabapentin  600 mg Oral QHS  . insulin aspart  0-5 Units Subcutaneous QHS  . insulin aspart  0-9 Units  Subcutaneous TID WC  . insulin detemir  5 Units Subcutaneous QHS  . metoprolol tartrate  12.5 mg Oral BID   Or  . metoprolol tartrate  12.5 mg Per Tube BID  . pantoprazole  40 mg Oral Daily  . sodium chloride flush  10-40 mL Intracatheter Q12H  . sodium chloride flush  3 mL Intravenous Q12H  . thyroid  120 mg Oral Q0600   Continuous Infusions: . sodium chloride    . lactated ringers     PRN Meds:.metoprolol tartrate, ondansetron (ZOFRAN) IV, phenol, sodium chloride flush, sodium chloride flush, traMADol  General appearance: alert, cooperative and no distress Neurologic: intact Heart: Currently in SR at 60/min but she has had brief episodes of atrial fibrillation.  Lungs: Breath sounds clear anterior. Abdomen: Active bowel sounds, abd is not tender. Extremities: Palpable thrill in RUE fistula with good distal perfusion. Minimal edema. EVH incision intact and dry. Wound: Sternotomy incision intact and dry.  Lab Results: CBC: Recent Labs    11/23/18 0415 11/24/18 0527  WBC 6.9 6.2  HGB 8.0* 8.0*  HCT 25.4* 26.5*  PLT 165 185   BMET:  Recent Labs    11/23/18 0415 11/24/18 0527  NA 138 137  K 3.9 4.1  CL 100 99  CO2 27 26  GLUCOSE 101* 111*  BUN 31* 48*  CREATININE 3.37* 4.81*  CALCIUM 8.1* 8.2*    CMET: Lab Results  Component Value Date   WBC 6.2 11/24/2018   HGB 8.0 (L) 11/24/2018   HCT 26.5 (L) 11/24/2018   PLT 185 11/24/2018   GLUCOSE 111 (H) 11/24/2018   CHOL 336 (H) 11/20/2018   TRIG 373 (H) 11/20/2018   HDL 40 (L) 11/20/2018   LDLCALC 221 (H) 11/20/2018   ALT 22 09/30/2018   AST 33 09/30/2018   NA 137 11/24/2018   K 4.1 11/24/2018   CL 99 11/24/2018   CREATININE 4.81 (H) 11/24/2018   BUN 48 (H) 11/24/2018   CO2 26 11/24/2018   TSH 0.326 (L) 11/06/2018   INR 1.4 (H) 11/20/2018   HGBA1C 5.2 11/20/2018   MICROALBUR 2,170.9 (H) 06/12/2017      PT/INR: No results for input(s): LABPROT, INR in the last 72 hours. Radiology: Dg Chest Port 1 View   Result Date: 11/24/2018 CLINICAL DATA:  Sore chest.  CABG. EXAM: PORTABLE CHEST 1 VIEW COMPARISON:  11/22/2018. FINDINGS: Surgical clips lower neck. Right IJ sheath noted in stable position. Pacing wires noted over the right chest. Prior CABG. Cardiomegaly. Prominent left base atelectasis. Bibasilar infiltrates. Small left pleural effusion. No pneumothorax. IMPRESSION: 1.  Right IJ sheath in stable position. 2. Prominent left base atelectasis. Bibasilar infiltrates. Small left pleural effusion. 3.  Prior CABG.  Stable cardiomegaly. Electronically Signed   By: Marcello Moores  Register   On: 11/24/2018 06:29     Assessment/Plan: S/P Procedure(s) (LRB): CORONARY ARTERY BYPASS GRAFTING (CABG), ON PUMP, TIMES four, USING LEFT INTERNAL MAMMARY ARTERY AND ENDOSCOPICALLY HARVESTED RIGHT GREATER SAPHENOUS VEIN (N/A) TRANSESOPHAGEAL ECHOCARDIOGRAM (TEE) (N/A)  -POD-4 CABGx4.  EF 55-60%. Making progress with mobility. Continue ASA.  B-blocker being held for bradycardia. Intolerant of statins.  -ESRD-Followed by nephrology. On HD T-Th-S. Ultrafiltration planned for today.   -Post-op afib--Currently in SR with brief episodes on PAF over past several hours, on oral amio.  -Type 2 Diabetes Mellitus--Glucose control adequate on SSI.  -Expected acute blood loss anemia with history of chronic anemia.  Hct 26%. On Darbepoetin.  Continue to monitor.  -Dyslipidemia- previously not tolerant of statins.  -Possible transfer to 4E later today after planned ultrafiltration.  -DVT PPX- continue SCD's   Malon Kindle [phone] 11/24/2018 10:13 AM  currently on dialysis Check labs in am Wait on transfer  I have seen and examined [name] and agree with the above assessment  and plan.  Grace Isaac MD Beeper [phone] Office [phone] 11/24/2018 5:56 PM

## 2018-11-24 NOTE — Progress Notes (Signed)
[facility] ROUNDING NOTE   Subjective:   This is a 78 year old lady that has a history of end-stage renal disease she is scheduled Tuesday Thursday Saturday dialysis at [facility].  She was a new start 11/07/2018.  Her last dialysis treatment was 11/22/2018 with the ultrafiltration of 1.5 L.  She underwent coronary artery bypass grafting 11/20/2018.  She has a history of congestive heart failure with an ejection fraction of 50 to 55% and inferior wall hypokinesis.  2D echo performed 08/2016.  Blood pressure 129/47 pulse 63 temperature 98.3 O2 sats 97% room air  Labs sodium 137 potassium 4.1 chloride 99 CO2 26 BUN 48 creatinine 4.81 calcium 8.2 magnesium 2.2 WBC 6.2 hemoglobin 8.0 platelets 185 last phosphorus was 6.7 on 11/22/2018 last iron saturation was 13% 11/22/2018  Status post blood transfusion 1 unit 11/22/2018  Amiodarone 200 mg daily aspirin 81 mg daily, PhosLo 667 mg 3 times daily with meals, darbepoetin 100 mcg q. Monday subcutaneous.  Uloric 40 mg daily gabapentin 600 mg nightly, insulin sliding scale, Levemir 5 units nightly, metoprolol 12.5 mg twice daily, Protonix 40 mg daily, thyroid 120 mg daily  CT scan of chest 11/07/2018 showed bilateral multifocal airspace consolidation cardiac enlargement with 3 vessel coronary artery calcifications pulmonary edema and bilateral pleural effusions.  Chest x-ray 11/04/2018 right IJ with sheath in place prior CABG stable cardiomegaly left base atelectasis bilateral infiltrates with small left pleural effusion  Hemoglobin A1c 5.2 11/20/2018 PTH 114 10/29/2018   TSH 0.326 11/06/2018    Objective:  Vital signs in last 24 hours:  Temp:  [97.9 F (36.6 C)-98.3 F (36.8 C)] 98.3 F (36.8 C) (06/29 0650) Pulse Rate:  [30-73] 61 (06/29 0700) Resp:  [8-28] 8 (06/29 0700) BP: (105-137)/(35-50) 117/41 (06/29 0700) SpO2:  [94 %-99 %] 97 % (06/29 0700) Weight:  [79.6 kg] 79.6 kg (06/29 0600)  Weight change: -3 kg Filed Weights   11/22/18  1358 11/23/18 0600 11/24/18 0600  Weight: 81.9 kg 80.9 kg 79.6 kg    Intake/Output: I/O last 3 completed shifts: In: 1100 [P.O.:1080; I.V.:20] Out: 190 [Urine:190]   Intake/Output this shift:  No intake/output data recorded.  CVS- RRR midline sternotomy scar faint systolic murmur RS- CTA double-lumen catheter right IJ ABD- BS present soft non-distended EXT- no edema AV fistula right arm   Basic Metabolic Panel: Recent Labs  Lab 11/17/18 1336  11/19/18 1320  11/20/18 0558  11/20/18 1846 11/20/18 1851  11/20/18 2328 11/21/18 0400 11/22/18 0350 11/23/18 0415 11/24/18 0527  NA 137  --  135  --  137   < >  --  137   < > 138 138 136 138 137  K 4.0  --  4.1  --  3.5   < >  --  4.8   < > 4.8 5.2* 4.8 3.9 4.1  CL 102  --  100  --  97*  --   --  103  --   --  105 100 100 99  CO2 21*  --  20*  --  26  --   --   --   --   --  22 23 27 26   GLUCOSE 151*  --  130*  --  117*   < >  --  115*  --   --  103* 107* 101* 111*  BUN 85*  --  95*  --  46*  --   --  41*  --   --  49* 45* 31*  48*  CREATININE 5.88*   < > 5.33*  --  4.01*  --  3.84* 3.50*  --   --  4.11* 3.90* 3.37* 4.81*  CALCIUM 8.5*  --  8.7*  --  8.8*  --   --   --   --   --  8.1* 8.1* 8.1* 8.2*  MG  --   --   --    < > 2.1  --  2.3  --   --   --  2.2 2.2 2.1 2.2  PHOS 6.0*  --  5.8*  --   --   --   --   --   --   --   --  6.7*  --   --    < > = values in this interval not displayed.    Liver Function Tests: Recent Labs  Lab 11/17/18 1336 11/19/18 1320  ALBUMIN 2.8* 2.9*   No results for input(s): LIPASE, AMYLASE in the last 168 hours. No results for input(s): AMMONIA in the last 168 hours.  CBC: Recent Labs  Lab 11/18/18 0446  11/20/18 1846  11/20/18 2328 11/21/18 0400 11/22/18 0450 11/23/18 0415 11/24/18 0527  WBC 7.5   < > 16.3*  --   --  13.7* 8.7 6.9 6.2  NEUTROABS 4.9  --   --   --   --   --   --   --   --   HGB 9.2*   < > 10.0*   < > 9.9* 9.1* 7.2* 8.0* 8.0*  HCT 28.9*   < > 31.1*   < > 29.0* 28.5*  23.4* 25.4* 26.5*  MCV 94.4   < > 92.0  --   --  93.1 96.7 95.5 98.5  PLT 332   < > 236  --   --  254 165 165 185   < > = values in this interval not displayed.    Cardiac Enzymes: No results for input(s): CKTOTAL, CKMB, CKMBINDEX, TROPONINI in the last 168 hours.  BNP: Invalid input(s): POCBNP  CBG: Recent Labs  Lab 11/23/18 0732 11/23/18 1126 11/23/18 1542 11/23/18 2150 11/24/18 0649  GLUCAP 106* 153* 144* 130* 128*    Microbiology: Results for orders placed or performed during the hospital encounter of 11/06/18  SARS Coronavirus 2 (CEPHEID - Performed in [place] hospital lab), Hosp Order     Status: None   Collection Time: 11/06/18  1:30 PM   Specimen: Nasopharyngeal Swab  Result Value Ref Range Status   SARS Coronavirus 2 NEGATIVE NEGATIVE Final    Comment: (NOTE) If result is NEGATIVE SARS-CoV-2 target nucleic acids are NOT DETECTED. The SARS-CoV-2 RNA is generally detectable in upper and lower  respiratory specimens during the acute phase of infection. The lowest  concentration of SARS-CoV-2 viral copies this assay can detect is 250  copies / mL. A negative result does not preclude SARS-CoV-2 infection  and should not be used as the sole basis for treatment or other  patient management decisions.  A negative result may occur with  improper specimen collection / handling, submission of specimen other  than nasopharyngeal swab, presence of viral mutation(s) within the  areas targeted by this assay, and inadequate number of viral copies  (<250 copies / mL). A negative result must be combined with clinical  observations, patient history, and epidemiological information. If result is POSITIVE SARS-CoV-2 target nucleic acids are DETECTED. The SARS-CoV-2 RNA is generally detectable in upper and lower  respiratory specimens  dur ing the acute phase of infection.  Positive  results are indicative of active infection with SARS-CoV-2.  Clinical  correlation with  patient history and other diagnostic information is  necessary to determine patient infection status.  Positive results do  not rule out bacterial infection or co-infection with other viruses. If result is PRESUMPTIVE POSTIVE SARS-CoV-2 nucleic acids MAY BE PRESENT.   A presumptive positive result was obtained on the submitted specimen  and confirmed on repeat testing.  While 2019 novel coronavirus  (SARS-CoV-2) nucleic acids may be present in the submitted sample  additional confirmatory testing may be necessary for epidemiological  and / or clinical management purposes  to differentiate between  SARS-CoV-2 and other Sarbecovirus currently known to infect humans.  If clinically indicated additional testing with an alternate test  methodology [phone]) is advised. The SARS-CoV-2 RNA is generally  detectable in upper and lower respiratory sp ecimens during the acute  phase of infection. The expected result is Negative. Fact Sheet for Patients:  StrictlyIdeas.no Fact Sheet for Healthcare Providers: BankingDealers.co.za This test is not yet approved or cleared by the [place] FDA and has been authorized for detection and/or diagnosis of SARS-CoV-2 by FDA under an Emergency Use Authorization (EUA).  This EUA will remain in effect (meaning this test can be used) for the duration of the COVID-19 declaration under Section 564(b)(1) of the Act, 21 U.S.C. section 360bbb-3(b)(1), unless the authorization is terminated or revoked sooner. Performed at [facility], [address] [place], [place] [address]   MRSA PCR Screening     Status: None   Collection Time: 11/07/18  6:09 AM   Specimen: Nasal Mucosa; Nasopharyngeal  Result Value Ref Range Status   MRSA by PCR NEGATIVE NEGATIVE Final    Comment:        The GeneXpert MRSA Assay (FDA approved for NASAL specimens only), is one component of a comprehensive MRSA colonization surveillance  program. It is not intended to diagnose MRSA infection nor to guide or monitor treatment for MRSA infections. Performed at [facility], [address], [place], [place] [address]   Culture, blood (routine x 2)     Status: None   Collection Time: 11/07/18  7:38 AM   Specimen: Right Antecubital; Blood  Result Value Ref Range Status   Specimen Description   Final    RIGHT ANTECUBITAL BOTTLES DRAWN AEROBIC AND ANAEROBIC   Special Requests Blood Culture adequate volume  Final   Culture   Final    NO GROWTH 5 DAYS Performed at [facility] [address] [place], [place] [address]    Report Status 11/12/2018 FINAL  Final  Culture, blood (routine x 2)     Status: None   Collection Time: 11/07/18  7:38 AM   Specimen: BLOOD LEFT FOREARM  Result Value Ref Range Status   Specimen Description   Final    BLOOD LEFT FOREARM BOTTLES DRAWN AEROBIC AND ANAEROBIC   Special Requests Blood Culture adequate volume  Final   Culture   Final    NO GROWTH 5 DAYS Performed at [facility], [address], [place],  [address]    Report Status 11/12/2018 FINAL  Final  Surgical PCR screen     Status: None   Collection Time: 11/19/18  8:40 PM   Specimen: Nasal Mucosa; Nasal Swab  Result Value Ref Range Status   MRSA, PCR NEGATIVE NEGATIVE Final   Staphylococcus aureus NEGATIVE NEGATIVE Final    Comment: (NOTE) The Xpert SA Assay (  FDA approved for NASAL specimens in patients 22 years of age and older), is one component of a comprehensive surveillance program. It is not intended to diagnose infection nor to guide or monitor treatment. Performed at [facility], [place] [address] [place], [place] [address]     Coagulation Studies: No results for input(s): LABPROT, INR in the last 72 hours.  Urinalysis: No results for input(s): COLORURINE, LABSPEC, PHURINE, GLUCOSEU, HGBUR, BILIRUBINUR, KETONESUR, PROTEINUR, UROBILINOGEN, NITRITE, LEUKOCYTESUR in the last 72 hours.  Invalid input(s):  APPERANCEUR    Imaging: Dg Chest Port 1 View  Result Date: 11/24/2018 CLINICAL DATA:  Sore chest.  CABG. EXAM: PORTABLE CHEST 1 VIEW COMPARISON:  11/22/2018. FINDINGS: Surgical clips lower neck. Right IJ sheath noted in stable position. Pacing wires noted over the right chest. Prior CABG. Cardiomegaly. Prominent left base atelectasis. Bibasilar infiltrates. Small left pleural effusion. No pneumothorax. IMPRESSION: 1.  Right IJ sheath in stable position. 2. Prominent left base atelectasis. Bibasilar infiltrates. Small left pleural effusion. 3.  Prior CABG.  Stable cardiomegaly. Electronically Signed   By: Marcello Moores  Register   On: 11/24/2018 06:29     Medications:   . sodium chloride    . lactated ringers     . acetaminophen  1,000 mg Oral Q6H   Or  . acetaminophen (TYLENOL) oral liquid 160 mg/5 mL  1,000 mg Per Tube Q6H  . amiodarone  200 mg Oral Daily  . aspirin EC  81 mg Oral Daily  . bisacodyl  10 mg Oral Daily   Or  . bisacodyl  10 mg Rectal Daily  . calcium acetate  667 mg Oral TID WC  . Chlorhexidine Gluconate Cloth  6 each Topical Q0600  . Chlorhexidine Gluconate Cloth  6 each Topical Q0600  . ciprofloxacin-dexamethasone  4 drop Left EAR BID  . darbepoetin (ARANESP) injection - NON-DIALYSIS  100 mcg Subcutaneous Q Mon-1800  . docusate sodium  200 mg Oral Daily  . febuxostat  40 mg Oral Daily  . gabapentin  600 mg Oral QHS  . insulin aspart  0-5 Units Subcutaneous QHS  . insulin aspart  0-9 Units Subcutaneous TID WC  . insulin detemir  5 Units Subcutaneous QHS  . metoprolol tartrate  12.5 mg Oral BID   Or  . metoprolol tartrate  12.5 mg Per Tube BID  . pantoprazole  40 mg Oral Daily  . sodium chloride flush  10-40 mL Intracatheter Q12H  . sodium chloride flush  3 mL Intravenous Q12H  . thyroid  120 mg Oral Q0600   metoprolol tartrate, ondansetron (ZOFRAN) IV, phenol, sodium chloride flush, sodium chloride flush, traMADol  Assessment/ Plan:   End-stage renal disease  Tuesday Thursday dialysis clipped to [facility] unit.  Will continue on schedule TTS.  Anemia hemoglobin 8.0 low iron saturations 11/22/2018 with a transfusion 1 pack blood cells 11/22/2018.  We will continue to follow  Bones PTH 114 calcium phosphorus continue to follow phosphorus slightly elevated continues on binders.  Nutrition pro-stat albumin 2.6  Hypertension controlled metoprolol 12.5 mg twice daily  Diabetes as per primary team  Status post CABG appreciate input and help from Dr. Prescott Gum  Coronary artery disease continues aspirin no statin at this point.  To be addressed by primary service.  Hypothyroidism on replacement therapy  Diastolic heart failure continues on amiodarone.  Hyperlipidemia recommend statin therapy per primary service.     LOS: [place] @TODAY @8 :00 AM

## 2018-11-25 LAB — CBC
HCT: 25.7 % — ABNORMAL LOW (ref 36.0–46.0)
Hemoglobin: 7.9 g/dL — ABNORMAL LOW (ref 12.0–15.0)
MCH: 29.8 pg (ref 26.0–34.0)
MCHC: 30.7 g/dL (ref 30.0–36.0)
MCV: 97 fL (ref 80.0–100.0)
Platelets: 189 10*3/uL (ref 150–400)
RBC: 2.65 MIL/uL — ABNORMAL LOW (ref 3.87–5.11)
RDW: 16.2 % — ABNORMAL HIGH (ref 11.5–15.5)
WBC: 5.6 10*3/uL (ref 4.0–10.5)
nRBC: 0 % (ref 0.0–0.2)

## 2018-11-25 LAB — POCT I-STAT 4, (NA,K, GLUC, HGB,HCT)
Glucose, Bld: 124 mg/dL — ABNORMAL HIGH (ref 70–99)
Glucose, Bld: 104 mg/dL — ABNORMAL HIGH (ref 70–99)
Glucose, Bld: 118 mg/dL — ABNORMAL HIGH (ref 70–99)
Glucose, Bld: 126 mg/dL — ABNORMAL HIGH (ref 70–99)
Glucose, Bld: 141 mg/dL — ABNORMAL HIGH (ref 70–99)
Glucose, Bld: 162 mg/dL — ABNORMAL HIGH (ref 70–99)
HCT: 25 % — ABNORMAL LOW (ref 36.0–46.0)
HCT: 22 % — ABNORMAL LOW (ref 36.0–46.0)
HCT: 23 % — ABNORMAL LOW (ref 36.0–46.0)
HCT: 25 % — ABNORMAL LOW (ref 36.0–46.0)
HCT: 24 % — ABNORMAL LOW (ref 36.0–46.0)
HCT: 24 % — ABNORMAL LOW (ref 36.0–46.0)
Hemoglobin: 8.5 g/dL — ABNORMAL LOW (ref 12.0–15.0)
Hemoglobin: 7.5 g/dL — ABNORMAL LOW (ref 12.0–15.0)
Hemoglobin: 7.8 g/dL — ABNORMAL LOW (ref 12.0–15.0)
Hemoglobin: 8.5 g/dL — ABNORMAL LOW (ref 12.0–15.0)
Hemoglobin: 8.2 g/dL — ABNORMAL LOW (ref 12.0–15.0)
Hemoglobin: 8.2 g/dL — ABNORMAL LOW (ref 12.0–15.0)
Potassium: 3.4 mmol/L — ABNORMAL LOW (ref 3.5–5.1)
Potassium: 3.4 mmol/L — ABNORMAL LOW (ref 3.5–5.1)
Potassium: 4.3 mmol/L (ref 3.5–5.1)
Potassium: 7.8 mmol/L (ref 3.5–5.1)
Potassium: 4.7 mmol/L (ref 3.5–5.1)
Potassium: 5.6 mmol/L — ABNORMAL HIGH (ref 3.5–5.1)
Sodium: 135 mmol/L (ref 135–145)
Sodium: 134 mmol/L — ABNORMAL LOW (ref 135–145)
Sodium: 136 mmol/L (ref 135–145)
Sodium: 135 mmol/L (ref 135–145)
Sodium: 135 mmol/L (ref 135–145)
Sodium: 137 mmol/L (ref 135–145)

## 2018-11-25 LAB — POCT I-STAT 7, (LYTES, BLD GAS, ICA,H+H)
Acid-Base Excess: 1 mmol/L (ref 0.0–2.0)
Acid-Base Excess: 2 mmol/L (ref 0.0–2.0)
Acid-Base Excess: 3 mmol/L — ABNORMAL HIGH (ref 0.0–2.0)
Bicarbonate: 26.1 mmol/L (ref 20.0–28.0)
Bicarbonate: 25.6 mmol/L (ref 20.0–28.0)
Bicarbonate: 25.6 mmol/L (ref 20.0–28.0)
Calcium, Ion: 1.16 mmol/L (ref 1.15–1.40)
Calcium, Ion: 0.88 mmol/L — CL (ref 1.15–1.40)
Calcium, Ion: 1 mmol/L — ABNORMAL LOW (ref 1.15–1.40)
HCT: 27 % — ABNORMAL LOW (ref 36.0–46.0)
HCT: 26 % — ABNORMAL LOW (ref 36.0–46.0)
HCT: 28 % — ABNORMAL LOW (ref 36.0–46.0)
Hemoglobin: 9.2 g/dL — ABNORMAL LOW (ref 12.0–15.0)
Hemoglobin: 8.8 g/dL — ABNORMAL LOW (ref 12.0–15.0)
Hemoglobin: 9.5 g/dL — ABNORMAL LOW (ref 12.0–15.0)
O2 Saturation: 100 %
O2 Saturation: 100 %
O2 Saturation: 100 %
Potassium: 3.5 mmol/L (ref 3.5–5.1)
Potassium: 4.4 mmol/L (ref 3.5–5.1)
Potassium: 4.9 mmol/L (ref 3.5–5.1)
Sodium: 137 mmol/L (ref 135–145)
Sodium: 137 mmol/L (ref 135–145)
Sodium: 138 mmol/L (ref 135–145)
TCO2: 27 mmol/L (ref 22–32)
TCO2: 27 mmol/L (ref 22–32)
TCO2: 27 mmol/L (ref 22–32)
pCO2 arterial: 40.7 mmHg (ref 32.0–48.0)
pCO2 arterial: 34.8 mmHg (ref 32.0–48.0)
pCO2 arterial: 32.1 mmHg (ref 32.0–48.0)
pH, Arterial: 7.416 (ref 7.350–7.450)
pH, Arterial: 7.474 — ABNORMAL HIGH (ref 7.350–7.450)
pH, Arterial: 7.51 — ABNORMAL HIGH (ref 7.350–7.450)
pO2, Arterial: 397 mmHg — ABNORMAL HIGH (ref 83.0–108.0)
pO2, Arterial: 409 mmHg — ABNORMAL HIGH (ref 83.0–108.0)
pO2, Arterial: 166 mmHg — ABNORMAL HIGH (ref 83.0–108.0)

## 2018-11-25 LAB — BASIC METABOLIC PANEL
Anion gap: 8 (ref 5–15)
BUN: 26 mg/dL — ABNORMAL HIGH (ref 8–23)
CO2: 27 mmol/L (ref 22–32)
Calcium: 7.9 mg/dL — ABNORMAL LOW (ref 8.9–10.3)
Chloride: 100 mmol/L (ref 98–111)
Creatinine, Ser: 3.32 mg/dL — ABNORMAL HIGH (ref 0.44–1.00)
GFR calc Af Amer: 15 mL/min — ABNORMAL LOW (ref >60)
GFR calc non Af Amer: 13 mL/min — ABNORMAL LOW (ref >60)
Glucose, Bld: 100 mg/dL — ABNORMAL HIGH (ref 70–99)
Potassium: 4 mmol/L (ref 3.5–5.1)
Sodium: 135 mmol/L (ref 135–145)

## 2018-11-25 LAB — MAGNESIUM: Magnesium: 1.9 mg/dL (ref 1.7–2.4)

## 2018-11-25 LAB — GLUCOSE, CAPILLARY
Glucose-Capillary: 100 mg/dL — ABNORMAL HIGH (ref 70–99)
Glucose-Capillary: 196 mg/dL — ABNORMAL HIGH (ref 70–99)
Glucose-Capillary: 109 mg/dL — ABNORMAL HIGH (ref 70–99)
Glucose-Capillary: 114 mg/dL — ABNORMAL HIGH (ref 70–99)

## 2018-11-25 MED ORDER — SORBITOL 70 % SOLN
15.0000 mL | Freq: Once | Status: AC
  Administered 2018-11-25: 15 mL via ORAL
  Filled 2018-11-25: qty 30

## 2018-11-25 MED ORDER — TRAMADOL HCL 50 MG PO TABS: 50.0000 mg | ORAL_TABLET | Freq: Two times a day (BID) | ORAL | Status: DC | PRN

## 2018-11-25 MED ORDER — METOCLOPRAMIDE HCL 5 MG/ML IJ SOLN
10.0000 mg | Freq: Four times a day (QID) | INTRAMUSCULAR | Status: AC
  Administered 2018-11-25 (×3): 10 mg via INTRAVENOUS
  Filled 2018-11-25 (×3): qty 2

## 2018-11-25 NOTE — Progress Notes (Signed)
[facility] ROUNDING NOTE   Subjective:   This is a 78 year old lady that has a history of end-stage renal disease she is scheduled Tuesday Thursday Saturday dialysis at [facility].  She was a new start 11/07/2018.  Her last dialysis treatment was 11/22/2018 with the ultrafiltration of 1.5 L.  She underwent coronary artery bypass grafting 11/20/2018.  She has a history of congestive heart failure with an ejection fraction of 50 to 55% and inferior wall hypokinesis.  2D echo performed 08/2016.  Blood pressure 132/44 pulse 58 temperature 97.9 O2 sats 94% 2 L nasal cannula  Labs sodium 135 potassium 4.0 chloride 100 CO2 27 BUN 26 creatinine 3.32 glucose 100 calcium 7.9 magnesium 1.9 WBC 5.6 hemoglobin 7.9 platelets 189  Status post blood transfusion 1 unit 11/22/2018  Amiodarone 200 mg daily aspirin 81 mg daily, PhosLo 667 mg 3 times daily with meals, darbepoetin 100 mcg q. Monday subcutaneous.  Uloric 40 mg daily gabapentin 600 mg nightly, insulin sliding scale, Levemir 5 units nightly, metoprolol 12.5 mg twice daily, Protonix 40 mg daily, thyroid 120 mg daily  CT scan of chest 11/07/2018 showed bilateral multifocal airspace consolidation cardiac enlargement with 3 vessel coronary artery calcifications pulmonary edema and bilateral pleural effusions.  Chest x-ray 11/04/2018 right IJ with sheath in place prior CABG stable cardiomegaly left base atelectasis bilateral infiltrates with small left pleural effusion  Hemoglobin A1c 5.2 11/20/2018 PTH 114 10/29/2018   TSH 0.326 11/06/2018    Objective:  Vital signs in last 24 hours:  Temp:  [97.8 F (36.6 C)-98.3 F (36.8 C)] 97.9 F (36.6 C) (06/30 0655) Pulse Rate:  [58-72] 58 (06/30 0800) Resp:  [6-20] 6 (06/30 0800) BP: (116-152)/(40-109) 132/44 (06/30 0800) SpO2:  [89 %-98 %] 95 % (06/30 0800) Weight:  [64 kg] 64 kg (06/30 0600)  Weight change: -15.6 kg Filed Weights   11/23/18 0600 11/24/18 0600 11/25/18 0600  Weight: 80.9 kg  79.6 kg 64 kg    Intake/Output: I/O last 3 completed shifts: In: 750 [P.O.:730; I.V.:20] Out: 1711 [Urine:50; ZDGLO:7564]   Intake/Output this shift:  No intake/output data recorded.  CVS- RRR midline sternotomy scar faint systolic murmur RS- CTA double-lumen catheter right IJ ABD- BS present soft non-distended EXT- no edema AV fistula right arm   Basic Metabolic Panel: Recent Labs  Lab 11/19/18 1320  11/21/18 0400 11/22/18 0350 11/23/18 0415 11/24/18 0527 11/25/18 0412  NA 135   < > 138 136 138 137 135  K 4.1   < > 5.2* 4.8 3.9 4.1 4.0  CL 100   < > 105 100 100 99 100  CO2 20*   < > 22 23 27 26 27   GLUCOSE 130*   < > 103* 107* 101* 111* 100*  BUN 95*   < > 49* 45* 31* 48* 26*  CREATININE 5.33*   < > 4.11* 3.90* 3.37* 4.81* 3.32*  CALCIUM 8.7*   < > 8.1* 8.1* 8.1* 8.2* 7.9*  MG  --    < > 2.2 2.2 2.1 2.2 1.9  PHOS 5.8*  --   --  6.7*  --   --   --    < > = values in this interval not displayed.    Liver Function Tests: Recent Labs  Lab 11/19/18 1320  ALBUMIN 2.9*   No results for input(s): LIPASE, AMYLASE in the last 168 hours. No results for input(s): AMMONIA in the last 168 hours.  CBC: Recent Labs  Lab 11/21/18 0400 11/22/18 0450  11/23/18 0415 11/24/18 0527 11/25/18 0412  WBC 13.7* 8.7 6.9 6.2 5.6  HGB 9.1* 7.2* 8.0* 8.0* 7.9*  HCT 28.5* 23.4* 25.4* 26.5* 25.7*  MCV 93.1 96.7 95.5 98.5 97.0  PLT 254 165 165 185 189    Cardiac Enzymes: No results for input(s): CKTOTAL, CKMB, CKMBINDEX, TROPONINI in the last 168 hours.  BNP: Invalid input(s): POCBNP  CBG: Recent Labs  Lab 11/24/18 0649 11/24/18 1108 11/24/18 1520 11/24/18 2138 11/25/18 0656  GLUCAP 128* 119* 122* 117* 100*    Microbiology: Results for orders placed or performed during the hospital encounter of 11/06/18  SARS Coronavirus 2 (CEPHEID - Performed in [place] hospital lab), Hosp Order     Status: None   Collection Time: 11/06/18  1:30 PM   Specimen: Nasopharyngeal  Swab  Result Value Ref Range Status   SARS Coronavirus 2 NEGATIVE NEGATIVE Final    Comment: (NOTE) If result is NEGATIVE SARS-CoV-2 target nucleic acids are NOT DETECTED. The SARS-CoV-2 RNA is generally detectable in upper and lower  respiratory specimens during the acute phase of infection. The lowest  concentration of SARS-CoV-2 viral copies this assay can detect is 250  copies / mL. A negative result does not preclude SARS-CoV-2 infection  and should not be used as the sole basis for treatment or other  patient management decisions.  A negative result may occur with  improper specimen collection / handling, submission of specimen other  than nasopharyngeal swab, presence of viral mutation(s) within the  areas targeted by this assay, and inadequate number of viral copies  (<250 copies / mL). A negative result must be combined with clinical  observations, patient history, and epidemiological information. If result is POSITIVE SARS-CoV-2 target nucleic acids are DETECTED. The SARS-CoV-2 RNA is generally detectable in upper and lower  respiratory specimens dur ing the acute phase of infection.  Positive  results are indicative of active infection with SARS-CoV-2.  Clinical  correlation with patient history and other diagnostic information is  necessary to determine patient infection status.  Positive results do  not rule out bacterial infection or co-infection with other viruses. If result is PRESUMPTIVE POSTIVE SARS-CoV-2 nucleic acids MAY BE PRESENT.   A presumptive positive result was obtained on the submitted specimen  and confirmed on repeat testing.  While 2019 novel coronavirus  (SARS-CoV-2) nucleic acids may be present in the submitted sample  additional confirmatory testing may be necessary for epidemiological  and / or clinical management purposes  to differentiate between  SARS-CoV-2 and other Sarbecovirus currently known to infect humans.  If clinically indicated  additional testing with an alternate test  methodology [id number]) is advised. The SARS-CoV-2 RNA is generally  detectable in upper and lower respiratory sp ecimens during the acute  phase of infection. The expected result is Negative. Fact Sheet for Patients:  StrictlyIdeas.no Fact Sheet for Healthcare Providers: BankingDealers.co.za This test is not yet approved or cleared by the [place] FDA and has been authorized for detection and/or diagnosis of SARS-CoV-2 by FDA under an Emergency Use Authorization (EUA).  This EUA will remain in effect (meaning this test can be used) for the duration of the COVID-19 declaration under Section 564(b)(1) of the Act, 21 U.S.C. section 360bbb-3(b)(1), unless the authorization is terminated or revoked sooner. Performed at [facility], [address], [place], [place] [address]   MRSA PCR Screening     Status: None   Collection Time: 11/07/18  6:09 AM   Specimen: Nasal Mucosa; Nasopharyngeal  Result  Value Ref Range Status   MRSA by PCR NEGATIVE NEGATIVE Final    Comment:        The GeneXpert MRSA Assay (FDA approved for NASAL specimens only), is one component of a comprehensive MRSA colonization surveillance program. It is not intended to diagnose MRSA infection nor to guide or monitor treatment for MRSA infections. Performed at [facility], [address] [place], [place] [address]   Culture, blood (routine x 2)     Status: None   Collection Time: 11/07/18  7:38 AM   Specimen: Right Antecubital; Blood  Result Value Ref Range Status   Specimen Description   Final    RIGHT ANTECUBITAL BOTTLES DRAWN AEROBIC AND ANAEROBIC   Special Requests Blood Culture adequate volume  Final   Culture   Final    NO GROWTH 5 DAYS Performed at [facility], [address] [place], [place] [address]    Report Status 11/12/2018 FINAL  Final  Culture, blood (routine x 2)     Status: None   Collection Time:  11/07/18  7:38 AM   Specimen: BLOOD LEFT FOREARM  Result Value Ref Range Status   Specimen Description   Final    BLOOD LEFT FOREARM BOTTLES DRAWN AEROBIC AND ANAEROBIC   Special Requests Blood Culture adequate volume  Final   Culture   Final    NO GROWTH 5 DAYS Performed at [facility], [address], [place], [place] [address]    Report Status 11/12/2018 FINAL  Final  Surgical PCR screen     Status: None   Collection Time: 11/19/18  8:40 PM   Specimen: Nasal Mucosa; Nasal Swab  Result Value Ref Range Status   MRSA, PCR NEGATIVE NEGATIVE Final   Staphylococcus aureus NEGATIVE NEGATIVE Final    Comment: (NOTE) The Xpert SA Assay (FDA approved for NASAL specimens in patients 2 years of age and older), is one component of a comprehensive surveillance program. It is not intended to diagnose infection nor to guide or monitor treatment. Performed at [facility], [place] [address], [place], [place] [address]     Coagulation Studies: No results for input(s): LABPROT, INR in the last 72 hours.  Urinalysis: No results for input(s): COLORURINE, LABSPEC, PHURINE, GLUCOSEU, HGBUR, BILIRUBINUR, KETONESUR, PROTEINUR, UROBILINOGEN, NITRITE, LEUKOCYTESUR in the last 72 hours.  Invalid input(s): APPERANCEUR    Imaging: Dg Chest Port 1 View  Result Date: 11/24/2018 CLINICAL DATA:  Sore chest.  CABG. EXAM: PORTABLE CHEST 1 VIEW COMPARISON:  11/22/2018. FINDINGS: Surgical clips lower neck. Right IJ sheath noted in stable position. Pacing wires noted over the right chest. Prior CABG. Cardiomegaly. Prominent left base atelectasis. Bibasilar infiltrates. Small left pleural effusion. No pneumothorax. IMPRESSION: 1.  Right IJ sheath in stable position. 2. Prominent left base atelectasis. Bibasilar infiltrates. Small left pleural effusion. 3.  Prior CABG.  Stable cardiomegaly. Electronically Signed   By: Marcello Moores  Register   On: 11/24/2018 06:29     Medications:   . sodium chloride    .  lactated ringers     . acetaminophen  1,000 mg Oral Q6H   Or  . acetaminophen (TYLENOL) oral liquid 160 mg/5 mL  1,000 mg Per Tube Q6H  . amiodarone  200 mg Oral Daily  . aspirin EC  81 mg Oral Daily  . bisacodyl  10 mg Oral Daily   Or  . bisacodyl  10 mg Rectal Daily  . calcium acetate  667 mg Oral TID WC  . Chlorhexidine Gluconate  Cloth  6 each Topical [id number]  . ciprofloxacin-dexamethasone  4 drop Left EAR BID  . darbepoetin (ARANESP) injection - NON-DIALYSIS  100 mcg Subcutaneous Q Mon-1800  . docusate sodium  200 mg Oral Daily  . febuxostat  40 mg Oral Daily  . gabapentin  600 mg Oral QHS  . insulin aspart  0-5 Units Subcutaneous QHS  . insulin aspart  0-9 Units Subcutaneous TID WC  . insulin detemir  5 Units Subcutaneous QHS  . metoCLOPramide (REGLAN) injection  10 mg Intravenous Q6H  . pantoprazole  40 mg Oral Daily  . sodium chloride flush  10-40 mL Intracatheter Q12H  . sodium chloride flush  3 mL Intravenous Q12H  . sorbitol  15 mL Oral Once  . thyroid  120 mg Oral Q0600   metoprolol tartrate, ondansetron (ZOFRAN) IV, phenol, sodium chloride flush, sodium chloride flush, traMADol  Assessment/ Plan:   End-stage renal disease Tuesday Thursday dialysis clipped to [facility] unit.  Will continue on schedule TTS.  Next dialysis treatment scheduled for 11/25/2018  Anemia hemoglobin 8.0 low iron saturations 11/22/2018 with a transfusion 1 pack blood cells 11/22/2018.  We will continue to follow  Bones PTH 114 calcium phosphorus continue to follow phosphorus slightly elevated continues on binders.  Will check phosphorus with labs 11/26/2018  Nutrition pro-stat albumin 2.6  Hypertension controlled metoprolol 12.5 mg twice daily  Diabetes as per primary team glucose 111-100  Status post CABG appreciate input and help from Dr. Prescott Gum  Coronary artery disease continues aspirin no statin at this point.  To be addressed by primary service.  Patient still not on  statin  Hypothyroidism on replacement therapy  Diastolic heart failure continues on amiodarone.  Hyperlipidemia recommend statin therapy per primary service.    LOS: [place] @TODAY @8 :48 AM

## 2018-11-25 NOTE — Progress Notes (Signed)
Progress Note  Patient Name: [name] Date of Encounter: 11/25/2018  Primary Cardiologist: Carlyle Dolly, MD   Subjective   No acute events  Inpatient Medications    Scheduled Meds:  acetaminophen  1,000 mg Oral Q6H   Or   acetaminophen (TYLENOL) oral liquid 160 mg/5 mL  1,000 mg Per Tube Q6H   amiodarone  200 mg Oral Daily   aspirin EC  81 mg Oral Daily   bisacodyl  10 mg Oral Daily   Or   bisacodyl  10 mg Rectal Daily   calcium acetate  667 mg Oral TID WC   Chlorhexidine Gluconate Cloth  6 each Topical Q0600   ciprofloxacin-dexamethasone  4 drop Left EAR BID   darbepoetin (ARANESP) injection - NON-DIALYSIS  100 mcg Subcutaneous Q Mon-1800   docusate sodium  200 mg Oral Daily   febuxostat  40 mg Oral Daily   gabapentin  600 mg Oral QHS   insulin aspart  0-5 Units Subcutaneous QHS   insulin aspart  0-9 Units Subcutaneous TID WC   insulin detemir  5 Units Subcutaneous QHS   metoCLOPramide (REGLAN) injection  10 mg Intravenous Q6H   pantoprazole  40 mg Oral Daily   sodium chloride flush  10-40 mL Intracatheter Q12H   sodium chloride flush  3 mL Intravenous Q12H   sorbitol  15 mL Oral Once   thyroid  120 mg Oral Q0600   Continuous Infusions:  sodium chloride     lactated ringers     PRN Meds: metoprolol tartrate, ondansetron (ZOFRAN) IV, phenol, sodium chloride flush, sodium chloride flush, traMADol   Vital Signs    Vitals:   11/25/18 0600 11/25/18 0655 11/25/18 0700 11/25/18 0800  BP: (!) 148/45  (!) 131/51 (!) 132/44  Pulse: 64  (!) 59 (!) 58  Resp: 12  11 (!) 6  Temp:  97.9 F (36.6 C)    TempSrc:  Oral    SpO2: 93%  (!) 89% 95%  Weight: 64 kg     Height:        Intake/Output Summary (Last 24 hours) at 11/25/2018 0905 Last data filed at 11/24/2018 1854 Gross per 24 hour  Intake 250 ml  Output 1711 ml  Net -1461 ml   Filed Weights   11/23/18 0600 11/24/18 0600 11/25/18 0600  Weight: 80.9 kg 79.6 kg 64 kg     Telemetry    Occasional PVCs, no pauses or afib noted in last 24 hrs - Personally Reviewed  ECG    No new - Personally Reviewed  Physical Exam   GEN: No acute distress, sitting up in chair Neck: No JVD Cardiac: regular rhythm, normal rate, soft systolic murmurs Respiratory: Clear to auscultation bilaterally. GI: Soft, nontender, non-distended  MS: No edema; No deformity.  Neuro:  Nonfocal  Psych: Normal affect   Labs    Chemistry Recent Labs  Lab 11/19/18 1320  11/23/18 0415 11/24/18 0527 11/25/18 0412  NA 135   < > 138 137 135  K 4.1   < > 3.9 4.1 4.0  CL 100   < > 100 99 100  CO2 20*   < > 27 26 27   GLUCOSE 130*   < > 101* 111* 100*  BUN 95*   < > 31* 48* 26*  CREATININE 5.33*   < > 3.37* 4.81* 3.32*  CALCIUM 8.7*   < > 8.1* 8.2* 7.9*  ALBUMIN 2.9*  --   --   --   --  GFRNONAA 7*   < > 12* 8* 13*  GFRAA 8*   < > 14* 9* 15*  ANIONGAP 15   < > 11 12 8    < > = values in this interval not displayed.     Hematology Recent Labs  Lab 11/23/18 0415 11/24/18 0527 11/25/18 0412  WBC 6.9 6.2 5.6  RBC 2.66* 2.69* 2.65*  HGB 8.0* 8.0* 7.9*  HCT 25.4* 26.5* 25.7*  MCV 95.5 98.5 97.0  MCH 30.1 29.7 29.8  MCHC 31.5 30.2 30.7  RDW 16.6* 16.8* 16.2*  PLT 165 185 189    Cardiac EnzymesNo results for input(s): TROPONINI in the last 168 hours. No results for input(s): TROPIPOC in the last 168 hours.   BNPNo results for input(s): BNP, PROBNP in the last 168 hours.   DDimer No results for input(s): DDIMER in the last 168 hours.   Radiology    Dg Chest Port 1 View  Result Date: 11/24/2018 CLINICAL DATA:  Sore chest.  CABG. EXAM: PORTABLE CHEST 1 VIEW COMPARISON:  11/22/2018. FINDINGS: Surgical clips lower neck. Right IJ sheath noted in stable position. Pacing wires noted over the right chest. Prior CABG. Cardiomegaly. Prominent left base atelectasis. Bibasilar infiltrates. Small left pleural effusion. No pneumothorax. IMPRESSION: 1.  Right IJ sheath in stable  position. 2. Prominent left base atelectasis. Bibasilar infiltrates. Small left pleural effusion. 3.  Prior CABG.  Stable cardiomegaly. Electronically Signed   By: Marcello Moores  Register   On: 11/24/2018 06:29    Cardiac Studies     Patient Profile     78 y.o. female with NSTEMI s/p CABG x 4, with atrial fibrillation, anemia, CKD on IHD, DM2, hypothyroidism.   Assessment & Plan   Principal Problem:   Non-ST elevation (NSTEMI) myocardial infarction [facility]) Active Problems:   HTN (hypertension)   Hyperlipidemia associated with type 2 diabetes mellitus (HCC)   Essential hypertension, benign   Hypothyroidism   ESRD on dialysis ([place])   S/P CABG x 4   CAD (coronary artery disease)   Acute on chronic diastolic CHF (congestive heart failure) (HCC)   Respiratory failure with hypoxia (HCC)   End-stage renal disease on hemodialysis ([place])   Diabetes mellitus type 2, controlled, with complications ([place])   Pressure injury of skin   Coronary artery disease status post CABG Continue ASA 81 mg, had pauses on telemetry 2 days ago, holding metoprolol.   Afib on 11/21/2018 pm  - currently in sinus rhythm with occasional bradycardia, continue amiodarone 200 mg daily currently. Holding metoprolol.  Hyperlipidemia with statin intolerance Will arrange for discussion of alternate therapy on hospital discharge, PCSK9 inhibitor possible.   Non-ST elevation myocardial infarction - CABG for revascularization. -Cardiac rehab when able  End-stage renal disease - per nephrology who is following.  DM2 - per IM.   HTN - stable for now, consider reinitiation of amlodipine if needed for hypertension control.       For questions or updates, please contact [place] Please consult [email or web address] for contact info under        Signed, Elouise Munroe, MD  11/25/2018, 9:05 AM

## 2018-11-25 NOTE — Progress Notes (Addendum)
TCTS DAILY ICU PROGRESS NOTE                   [address]            [place] [id number]          [phone]   5 Days Post-Op Procedure(s) (LRB): CORONARY ARTERY BYPASS GRAFTING (CABG), ON PUMP, TIMES four, USING LEFT INTERNAL MAMMARY ARTERY AND ENDOSCOPICALLY HARVESTED RIGHT GREATER SAPHENOUS VEIN (N/A) TRANSESOPHAGEAL ECHOCARDIOGRAM (TEE) (N/A)  Total Length of Stay:  LOS: 18 days   Subjective: Patient sitting in chair this am. She does not have any specific complaint except still waiting for breakfast tray  Objective: Vital signs in last 24 hours: Temp:  [97.8 F (36.6 C)-98.3 F (36.8 C)] 97.9 F (36.6 C) (06/30 0655) Pulse Rate:  [58-72] 59 (06/30 0700) Cardiac Rhythm: Normal sinus rhythm;Sinus bradycardia (06/30 0730) Resp:  [8-20] 11 (06/30 0700) BP: (116-152)/(40-109) 131/51 (06/30 0700) SpO2:  [89 %-98 %] 89 % (06/30 0700) Weight:  [64 kg] 64 kg (06/30 0600)  Filed Weights   11/23/18 0600 11/24/18 0600 11/25/18 0600  Weight: 80.9 kg 79.6 kg 64 kg    Weight change: -15.6 kg   Hemodynamic parameters for last 24 hours:    Intake/Output from previous day: 06/29 0701 - 06/30 0700 In: 250 [P.O.:250] Out: 1711 [Urine:50]  Intake/Output this shift: No intake/output data recorded.  Current Meds: Scheduled Meds: . acetaminophen  1,000 mg Oral Q6H   Or  . acetaminophen (TYLENOL) oral liquid 160 mg/5 mL  1,000 mg Per Tube Q6H  . amiodarone  200 mg Oral Daily  . aspirin EC  81 mg Oral Daily  . bisacodyl  10 mg Oral Daily   Or  . bisacodyl  10 mg Rectal Daily  . calcium acetate  667 mg Oral TID WC  . Chlorhexidine Gluconate Cloth  6 each Topical Q0600  . ciprofloxacin-dexamethasone  4 drop Left EAR BID  . darbepoetin (ARANESP) injection - NON-DIALYSIS  100 mcg Subcutaneous Q Mon-1800  . docusate sodium  200 mg Oral Daily  . febuxostat  40 mg Oral Daily  . gabapentin  600 mg Oral QHS  . insulin aspart  0-5 Units Subcutaneous QHS  .  insulin aspart  0-9 Units Subcutaneous TID WC  . insulin detemir  5 Units Subcutaneous QHS  . metoprolol tartrate  12.5 mg Oral BID   Or  . metoprolol tartrate  12.5 mg Per Tube BID  . pantoprazole  40 mg Oral Daily  . sodium chloride flush  10-40 mL Intracatheter Q12H  . sodium chloride flush  3 mL Intravenous Q12H  . thyroid  120 mg Oral Q0600   Continuous Infusions: . sodium chloride    . lactated ringers     PRN Meds:.metoprolol tartrate, ondansetron (ZOFRAN) IV, phenol, sodium chloride flush, sodium chloride flush, traMADol  General appearance: alert, cooperative and no distress Heart: Slightly bradycardic Lungs: Slightly diminished bibasilar breath sounds Abdomen: Soft, non tender, bowel sounds present Extremities: No LE edema Wound: Sternal and right lower leg wounds are clean and dry  Lab Results: CBC: Recent Labs    11/24/18 0527 11/25/18 0412  WBC 6.2 5.6  HGB 8.0* 7.9*  HCT 26.5* 25.7*  PLT 185 189   BMET:  Recent Labs    11/24/18 0527 11/25/18 0412  NA 137 135  K 4.1 4.0  CL 99 100  CO2 26 27  GLUCOSE 111* 100*  BUN 48* 26*  CREATININE 4.81*  3.32*  CALCIUM 8.2* 7.9*    CMET: Lab Results  Component Value Date   WBC 5.6 11/25/2018   HGB 7.9 (L) 11/25/2018   HCT 25.7 (L) 11/25/2018   PLT 189 11/25/2018   GLUCOSE 100 (H) 11/25/2018   CHOL 336 (H) 11/20/2018   TRIG 373 (H) 11/20/2018   HDL 40 (L) 11/20/2018   LDLCALC 221 (H) 11/20/2018   ALT 22 09/30/2018   AST 33 09/30/2018   NA 135 11/25/2018   K 4.0 11/25/2018   CL 100 11/25/2018   CREATININE 3.32 (H) 11/25/2018   BUN 26 (H) 11/25/2018   CO2 27 11/25/2018   TSH 0.326 (L) 11/06/2018   INR 1.4 (H) 11/20/2018   HGBA1C 5.2 11/20/2018   MICROALBUR 2,170.9 (H) 06/12/2017      PT/INR: No results for input(s): LABPROT, INR in the last 72 hours. Radiology: No results found.   Assessment/Plan: S/P Procedure(s) (LRB): CORONARY ARTERY BYPASS GRAFTING (CABG), ON PUMP, TIMES four, USING  LEFT INTERNAL MAMMARY ARTERY AND ENDOSCOPICALLY HARVESTED RIGHT GREATER SAPHENOUS VEIN (N/A) TRANSESOPHAGEAL ECHOCARDIOGRAM (TEE) (N/A)   1. CV-Previous a fib.  SB this am with HR in the 50's and according to nurse did have a pause yesterda. She has not been getting BB regularly. She is on Lopressor 12.5 mg bid and Amiodarone 200 mg daily. Will stop Lopressor 2. Pulmonary-On 2 liters of oxygen via . Wean as able. CXR this am not ordered so will order for am. Encourage incentive spirometer. 3. Anemia of chronic disease-H and H this am 7.9 and 25.7. On Aranesp. She has previously had transfusion 4. CKD (stage III)-HD days usually T.TH.Sat. Creatinine 3.32 this am. Nephrology following. 5. GI- N/V yesterday morning and after lunch, but no symptoms since then. She denies abdominal pain. It has been a few days since she had a bowel movement. As patient request, prune juice and Sorbitol. Will also give a few doses of Reglan. 6. DM-CBGs 122/117/100. On Insulin. Pre op HGA1C 5.2. 7. Hypothyroidism-continue Armour 120 mg daily 8. Remove right IJ sleeve 9. Remove EPW 10. Will discuss with surgeon if ok to transfer 11. Not on statin secondary to previous intolerance. May be able to consider PCSK9 if cardiology in agreement after discharge  [name] Alba PA-C 11/25/2018 8:01 AM   Dialysis last night  Stable today For transfer to 4e today Dialysis tomorrow  I have seen and examined [name] and agree with the above assessment  and plan.  Grace Isaac MD [place] [phone] Office [phone] 11/25/2018 2:33 PM

## 2018-11-25 NOTE — Progress Notes (Signed)
Leane Para, RN called and made aware that the patient's HD treatment has been moved to 11/26/2018 by Dr. Edrick Oh, Nephrologist

## 2018-11-25 NOTE — Progress Notes (Signed)
Pt transferred to 4E-01 via wheelchair from [place] with RN. Pt walked to bed. CHG bath given. Tele applied, CCMD notified. Pt oriented to bed, call bell and room. Call bell within reach. VSS. Will continue to monitor.  Amanda Cockayne, RN

## 2018-11-25 NOTE — Progress Notes (Signed)
PROGRESS NOTE    [name]  VXB:[id number] DOB: Sep 16, 1940 DOA: 11/06/2018 PCP: Doree Albee, MD   Brief Narrative:  78 year old WF PMHx  DM 2 controlled with complication, HTN, CKD stage 5, CAD s/p STEMI treated medically due to advanced CKD and anemia, gout, chronic combined systolic and diastolic CHF, Hypothyroid,   Presented to ED due to chest pain and dyspnea. She was diagnosed with NSTEMI, acute on chronic diastolic CHF, progressive CKD to ESRD. Nephrology consulted and HD initiated this admission. Cardiology consulted, cardiac cath 6/17 showed severe three-vessel disease, TCTS consulted and plan CABG after Plavix washout (last dose 11/12/2018). Patient for CABG on 11/20/2018. Stable.     Subjective: 6/30 A/O x4, negative S OB, negative CP, negative abdominal pain.  Assessment & Plan:   Principal Problem:   Non-ST elevation (NSTEMI) myocardial infarction [facility]) Active Problems:   HTN (hypertension)   Hyperlipidemia associated with type 2 diabetes mellitus (HCC)   Essential hypertension, benign   Hypothyroidism   ESRD on dialysis ([place])   S/P CABG x 4   CAD (coronary artery disease)   Acute on chronic diastolic CHF (congestive heart failure) (HCC)   Respiratory failure with hypoxia (HCC)   End-stage renal disease on hemodialysis ([place])   Diabetes mellitus type 2, controlled, with complications ([place])   Pressure injury of skin  NSTEMI/CAD -6/17 showed severe triple-vessel CAD with chronic occlusion distal RCA; cardiothoracic surgery consulted -6/17 last dose of Plavix administered will have to await Plavix washout, CABG tentatively scheduled for 6/25. -6/25 s/p CABG -Transfuse for hemoglobin<8 -6/27 transfuse 1 unit PRBC  Acute on chronic diastolic CHF -See NSTEMI -Strict in and out -466.7ml -Daily weight Filed Weights   11/23/18 0600 11/24/18 0600 11/25/18 0600  Weight: 80.9 kg 79.6 kg 64 kg  -Amiodarone 200 mg daily -Metoprolol PRN  Essential HTN  -See  CHF  Respiratory failure with hypoxia - Secondary to decompensated CHF has resolved. - Titrate O2 to maintain SPO2> 93%  ESRD (new) T/Th/Sat  - Most likely secondary to Bactrim, cardiac catheterization.  - CRRT/HD per nephrology - End-stage renal disease Tuesday Thursday dialysis clipped to [facility] unit.  Will continue on schedule TTS.  Next dialysis treatment scheduled for 11/25/2018  Hyperkalemia -Corrected with CRRT  Diabetes type 2 controlled with complication - 3/30 hemoglobin A1c= 5.2 - Levemir 5 units nightly - Custom SSI  HLD - LDL not within ADA/AHA guidelines - LDL goal<70 - Patient removed CRRT will address if she can tolerate any statin tomorrow.     Hypothyroid - Thyroid ([place]) 120 mg daily  Gout - Uloric 40 mg daily -6/27 uric acid = 3.2   Anemia ESRD./Anemia of chronic disease. -Hx GI bleed. -Aranesp per nephrology.    Secondary hyperparathyroidism - PhosLo 667 mg 3 times daily  Goals of care - 6/30 patient appears stable enough to transfer out of ICU to a telemetry bed.  Will await cardiothoracic surgery to round and cleared her from their standpoint.    DVT prophylaxis: Subcu heparin Code Status: Full Family Communication: None Disposition Plan: TBD   Consultants:  Nephrology Cardiothoracic surgery Cardiology    Procedures/Significant Events:  6/25 CABG 6/27 transfuse 1 unit PRBC    I have personally reviewed and interpreted all radiology studies and my findings are as above.  VENTILATOR SETTINGS:    Cultures   Antimicrobials: Anti-infectives (From admission, onward)   Start     Stop   11/20/18 2000  vancomycin (VANCOCIN) IVPB 1000 mg/200 mL premix  11/20/18 2137   11/20/18 2000  cefUROXime (ZINACEF) 1.5 g in sodium chloride 0.9 % 100 mL IVPB     11/21/18 2022   11/20/18 1800  cefUROXime (ZINACEF) 1.5 g in sodium chloride 0.9 % 100 mL IVPB  Status:  Discontinued     11/20/18 1542   11/20/18 0400  vancomycin  (VANCOCIN) 1,250 mg in sodium chloride 0.9 % 250 mL IVPB     11/20/18 0852   11/20/18 0400  cefUROXime (ZINACEF) 1.5 g in sodium chloride 0.9 % 100 mL IVPB     11/20/18 0852   11/19/18 0930  cefUROXime (ZINACEF) 750 mg in sodium chloride 0.9 % 100 mL IVPB  Status:  Discontinued     11/20/18 0930   11/07/18 2200  ceFEPIme (MAXIPIME) 1 g in sodium chloride 0.9 % 100 mL IVPB  Status:  Discontinued     11/07/18 1227   11/07/18 1800  vancomycin (VANCOCIN) IVPB 750 mg/150 ml premix  Status:  Discontinued     11/07/18 1227   11/07/18 0745  vancomycin (VANCOCIN) 1,500 mg in sodium chloride 0.9 % 500 mL IVPB     11/07/18 1044   11/07/18 0730  ceFEPIme (MAXIPIME) 1 g in sodium chloride 0.9 % 100 mL IVPB  Status:  Discontinued     11/07/18 1209   11/07/18 0730  vancomycin (VANCOCIN) IVPB 1000 mg/200 mL premix  Status:  Discontinued     11/07/18 0735   11/06/18 2200  sulfamethoxazole-trimethoprim (BACTRIM) 400-80 MG per tablet 1 tablet  Status:  Discontinued     11/07/18 1227       Devices    LINES / TUBES:      Continuous Infusions: . sodium chloride    . lactated ringers       Objective: Vitals:   11/25/18 0600 11/25/18 0655 11/25/18 0700 11/25/18 0800  BP: (!) 148/45  (!) 131/51 (!) 132/44  Pulse: 64  (!) 59 (!) 58  Resp: 12  11 (!) 6  Temp:  97.9 F (36.6 C)    TempSrc:  Oral    SpO2: 93%  (!) 89% 95%  Weight: 64 kg     Height:        Intake/Output Summary (Last 24 hours) at 11/25/2018 0819 Last data filed at 11/24/2018 1854 Gross per 24 hour  Intake 250 ml  Output 1711 ml  Net -1461 ml   Filed Weights   11/23/18 0600 11/24/18 0600 11/25/18 0600  Weight: 80.9 kg 79.6 kg 64 kg   Physical Exam:  General: A/.O x4 no acute respiratory distress Eyes: negative scleral hemorrhage, negative anisocoria, negative icterus ENT: Negative Runny nose, negative gingival bleeding, Neck:  Negative scars, masses, torticollis, lymphadenopathy, JVD Lungs: Clear to auscultation  bilaterally without wheezes or crackles Cardiovascular: Regular rate and rhythm without murmur gallop or rub normal S1 and S2 Abdomen: negative abdominal pain, nondistended, positive soft, bowel sounds, no rebound, no ascites, no appreciable mass Extremities: No significant cyanosis, clubbing, or edema bilateral lower extremities Skin: Negative rashes, lesions, ulcers Psychiatric:  Negative depression, negative anxiety, negative fatigue, negative mania  Central nervous system:  Cranial nerves II through XII intact, tongue/uvula midline, all extremities muscle strength 5/5, sensation intact throughout, negative dysarthria, negative expressive aphasia, negative receptive aphasia.    Data Reviewed: Care during the described time interval was provided by me .  I have reviewed this patient's available data, including medical history, events of note, physical examination, and all test results as part of my evaluation.  CBC: Recent Labs  Lab 11/21/18 0400 11/22/18 0450 11/23/18 0415 11/24/18 0527 11/25/18 0412  WBC 13.7* 8.7 6.9 6.2 5.6  HGB 9.1* 7.2* 8.0* 8.0* 7.9*  HCT 28.5* 23.4* 25.4* 26.5* 25.7*  MCV 93.1 96.7 95.5 98.5 97.0  PLT 254 165 165 185 301   Basic Metabolic Panel: Recent Labs  Lab 11/19/18 1320  11/21/18 0400 11/22/18 0350 11/23/18 0415 11/24/18 0527 11/25/18 0412  NA 135   < > 138 136 138 137 135  K 4.1   < > 5.2* 4.8 3.9 4.1 4.0  CL 100   < > 105 100 100 99 100  CO2 20*   < > 22 23 27 26 27   GLUCOSE 130*   < > 103* 107* 101* 111* 100*  BUN 95*   < > 49* 45* 31* 48* 26*  CREATININE 5.33*   < > 4.11* 3.90* 3.37* 4.81* 3.32*  CALCIUM 8.7*   < > 8.1* 8.1* 8.1* 8.2* 7.9*  MG  --    < > 2.2 2.2 2.1 2.2 1.9  PHOS 5.8*  --   --  6.7*  --   --   --    < > = values in this interval not displayed.   GFR: Estimated Creatinine Clearance: 11.9 mL/min (A) (by C-G formula based on SCr of 3.32 mg/dL (H)). Liver Function Tests: Recent Labs  Lab 11/19/18 1320  ALBUMIN  2.9*   No results for input(s): LIPASE, AMYLASE in the last 168 hours. No results for input(s): AMMONIA in the last 168 hours. Coagulation Profile: Recent Labs  Lab 11/19/18 0903 11/20/18 1329  INR 1.0 1.4*   Cardiac Enzymes: No results for input(s): CKTOTAL, CKMB, CKMBINDEX, TROPONINI in the last 168 hours. BNP (last 3 results) No results for input(s): PROBNP in the last 8760 hours. HbA1C: No results for input(s): HGBA1C in the last 72 hours. CBG: Recent Labs  Lab 11/24/18 0649 11/24/18 1108 11/24/18 1520 11/24/18 2138 11/25/18 0656  GLUCAP 128* 119* 122* 117* 100*   Lipid Profile: No results for input(s): CHOL, HDL, LDLCALC, TRIG, CHOLHDL, LDLDIRECT in the last 72 hours. Thyroid Function Tests: No results for input(s): TSH, T4TOTAL, FREET4, T3FREE, THYROIDAB in the last 72 hours. Anemia Panel: Recent Labs    11/22/18 1811  TIBC 143*  IRON 19*   Urine analysis:    Component Value Date/Time   COLORURINE YELLOW 11/19/2018 2010   APPEARANCEUR CLOUDY (A) 11/19/2018 2010   LABSPEC 1.018 11/19/2018 2010   PHURINE 5.0 11/19/2018 2010   GLUCOSEU 50 (A) 11/19/2018 2010   HGBUR NEGATIVE 11/19/2018 2010   [place] 11/19/2018 2010   KETONESUR NEGATIVE 11/19/2018 2010   PROTEINUR >=300 (A) 11/19/2018 2010   UROBILINOGEN 0.2 02/08/2015 1615   NITRITE NEGATIVE 11/19/2018 2010   LEUKOCYTESUR NEGATIVE 11/19/2018 2010   Sepsis Labs: @LABRCNTIP (procalcitonin:4,lacticidven:4)  ) Recent Results (from the past 240 hour(s))  Surgical PCR screen     Status: None   Collection Time: 11/19/18  8:40 PM   Specimen: Nasal Mucosa; Nasal Swab  Result Value Ref Range Status   MRSA, PCR NEGATIVE NEGATIVE Final   Staphylococcus aureus NEGATIVE NEGATIVE Final    Comment: (NOTE) The Xpert SA Assay (FDA approved for NASAL specimens in patients 57 years of age and older), is one component of a comprehensive surveillance program. It is not intended to diagnose infection nor  to guide or monitor treatment. Performed at [facility], [place] [address] [place], [place] [address]  Radiology Studies: Dg Chest Port 1 View  Result Date: 11/24/2018 CLINICAL DATA:  Sore chest.  CABG. EXAM: PORTABLE CHEST 1 VIEW COMPARISON:  11/22/2018. FINDINGS: Surgical clips lower neck. Right IJ sheath noted in stable position. Pacing wires noted over the right chest. Prior CABG. Cardiomegaly. Prominent left base atelectasis. Bibasilar infiltrates. Small left pleural effusion. No pneumothorax. IMPRESSION: 1.  Right IJ sheath in stable position. 2. Prominent left base atelectasis. Bibasilar infiltrates. Small left pleural effusion. 3.  Prior CABG.  Stable cardiomegaly. Electronically Signed   By: Marcello Moores  Register   On: 11/24/2018 06:29        Scheduled Meds: . acetaminophen  1,000 mg Oral Q6H   Or  . acetaminophen (TYLENOL) oral liquid 160 mg/5 mL  1,000 mg Per Tube Q6H  . amiodarone  200 mg Oral Daily  . aspirin EC  81 mg Oral Daily  . bisacodyl  10 mg Oral Daily   Or  . bisacodyl  10 mg Rectal Daily  . calcium acetate  667 mg Oral TID WC  . Chlorhexidine Gluconate Cloth  6 each Topical Q0600  . ciprofloxacin-dexamethasone  4 drop Left EAR BID  . darbepoetin (ARANESP) injection - NON-DIALYSIS  100 mcg Subcutaneous Q Mon-1800  . docusate sodium  200 mg Oral Daily  . febuxostat  40 mg Oral Daily  . gabapentin  600 mg Oral QHS  . insulin aspart  0-5 Units Subcutaneous QHS  . insulin aspart  0-9 Units Subcutaneous TID WC  . insulin detemir  5 Units Subcutaneous QHS  . metoCLOPramide (REGLAN) injection  10 mg Intravenous Q6H  . pantoprazole  40 mg Oral Daily  . sodium chloride flush  10-40 mL Intracatheter Q12H  . sodium chloride flush  3 mL Intravenous Q12H  . sorbitol  15 mL Oral Once  . thyroid  120 mg Oral Q0600   Continuous Infusions: . sodium chloride    . lactated ringers       LOS: 18 days   The patient is critically ill with multiple organ  systems failure and requires high complexity decision making for assessment and support, frequent evaluation and titration of therapies, application of advanced monitoring technologies and extensive interpretation of multiple databases. Critical Care Time devoted to patient care services described in this note  Time spent: 40 minutes     Granvel Proudfoot, Geraldo Docker, MD [facility] Pager [phone]  If 7PM-7AM, please contact night-coverage [email or web address] Password [email or web address] 11/25/2018, 8:19 AM

## 2018-11-26 ENCOUNTER — Inpatient Hospital Stay (HOSPITAL_COMMUNITY): Payer: Medicare HMO

## 2018-11-26 ENCOUNTER — Ambulatory Visit (HOSPITAL_COMMUNITY): Payer: Medicare HMO

## 2018-11-26 ENCOUNTER — Other Ambulatory Visit (HOSPITAL_COMMUNITY): Payer: Medicare HMO

## 2018-11-26 LAB — RENAL FUNCTION PANEL
Albumin: 2.2 g/dL — ABNORMAL LOW (ref 3.5–5.0)
Anion gap: 11 (ref 5–15)
BUN: 44 mg/dL — ABNORMAL HIGH (ref 8–23)
CO2: 24 mmol/L (ref 22–32)
Calcium: 8.5 mg/dL — ABNORMAL LOW (ref 8.9–10.3)
Chloride: 101 mmol/L (ref 98–111)
Creatinine, Ser: 5.22 mg/dL — ABNORMAL HIGH (ref 0.44–1.00)
GFR calc Af Amer: 8 mL/min — ABNORMAL LOW (ref >60)
GFR calc non Af Amer: 7 mL/min — ABNORMAL LOW (ref >60)
Glucose, Bld: 143 mg/dL — ABNORMAL HIGH (ref 70–99)
Phosphorus: 5.1 mg/dL — ABNORMAL HIGH (ref 2.5–4.6)
Potassium: 3.9 mmol/L (ref 3.5–5.1)
Sodium: 136 mmol/L (ref 135–145)

## 2018-11-26 LAB — CBC
HCT: 26.9 % — ABNORMAL LOW (ref 36.0–46.0)
Hemoglobin: 8.2 g/dL — ABNORMAL LOW (ref 12.0–15.0)
MCH: 29.9 pg (ref 26.0–34.0)
MCHC: 30.5 g/dL (ref 30.0–36.0)
MCV: 98.2 fL (ref 80.0–100.0)
Platelets: 207 10*3/uL (ref 150–400)
RBC: 2.74 MIL/uL — ABNORMAL LOW (ref 3.87–5.11)
RDW: 15.6 % — ABNORMAL HIGH (ref 11.5–15.5)
WBC: 5.2 10*3/uL (ref 4.0–10.5)
nRBC: 0 % (ref 0.0–0.2)

## 2018-11-26 LAB — MAGNESIUM: Magnesium: 2.2 mg/dL (ref 1.7–2.4)

## 2018-11-26 LAB — GLUCOSE, CAPILLARY
Glucose-Capillary: 104 mg/dL — ABNORMAL HIGH (ref 70–99)
Glucose-Capillary: 131 mg/dL — ABNORMAL HIGH (ref 70–99)
Glucose-Capillary: 125 mg/dL — ABNORMAL HIGH (ref 70–99)

## 2018-11-26 MED ORDER — AMLODIPINE BESYLATE 5 MG PO TABS: 5.0000 mg | ORAL_TABLET | Freq: Every day | ORAL | Status: DC

## 2018-11-26 MED ORDER — HEPARIN SODIUM (PORCINE) 1000 UNIT/ML DIALYSIS
1000.0000 [IU] | INTRAMUSCULAR | Status: DC | PRN
  Filled 2018-11-26: qty 1

## 2018-11-26 MED ORDER — LIDOCAINE HCL (PF) 1 % IJ SOLN: 5.0000 mL | INTRAMUSCULAR | Status: DC | PRN

## 2018-11-26 MED ORDER — ALTEPLASE 2 MG IJ SOLR: 2.0000 mg | Freq: Once | INTRAMUSCULAR | Status: DC | PRN

## 2018-11-26 MED ORDER — LIDOCAINE-PRILOCAINE 2.5-2.5 % EX CREA
1.0000 "application " | TOPICAL_CREAM | CUTANEOUS | Status: DC | PRN
  Filled 2018-11-26: qty 5

## 2018-11-26 MED ORDER — SODIUM CHLORIDE 0.9 % IV SOLN: 100.0000 mL | INTRAVENOUS | Status: DC | PRN

## 2018-11-26 MED ORDER — PENTAFLUOROPROP-TETRAFLUOROETH EX AERO: 1.0000 "application " | INHALATION_SPRAY | CUTANEOUS | Status: DC | PRN

## 2018-11-26 MED ORDER — ACETAMINOPHEN 325 MG PO TABS
650.0000 mg | ORAL_TABLET | Freq: Four times a day (QID) | ORAL | Status: DC | PRN
  Administered 2018-11-28: 650 mg via ORAL
  Filled 2018-11-26: qty 2

## 2018-11-26 MED ORDER — AMLODIPINE BESYLATE 10 MG PO TABS
10.0000 mg | ORAL_TABLET | Freq: Every day | ORAL | Status: DC
  Administered 2018-11-26 – 2018-11-30 (×3): 10 mg via ORAL
  Filled 2018-11-26 (×3): qty 1

## 2018-11-26 NOTE — Progress Notes (Signed)
[facility] PROGRESS NOTE  Patient: [name] FJU:[id number]   PCP: Doree Albee, MD DOB: 12/29/1940   DOA: 11/06/2018   DOS: 11/26/2018    Subjective: No acute complaint.  Objective:  Vitals:   11/26/18 1457 11/26/18 1936  BP: (!) 133/40 (!) 137/56  Pulse: 76 74  Resp: 12 18  Temp: 98.4 F (36.9 C) 98.5 F (36.9 C)  SpO2: 98% 98%    Clear to auscultation.  S1-S2 present.  Assessment and plan: Continue care per cardiothoracic surgery.  Appreciate their assistance in management.  Author: Berle Mull, MD [facility] 11/26/2018 8:16 PM   If 7PM-7AM, please contact night-coverage at [email or web address]

## 2018-11-26 NOTE — Progress Notes (Signed)
[facility] ROUNDING NOTE   Subjective:   This is a 78 year old lady that has a history of end-stage renal disease she is scheduled Tuesday Thursday Saturday dialysis at [facility].  She was a new start 11/07/2018.  Her last dialysis treatment was 11/22/2018 with the ultrafiltration of 1.5 L.  She underwent coronary artery bypass grafting 11/20/2018.  She has a history of congestive heart failure with an ejection fraction of 50 to 55% and inferior wall hypokinesis.  2D echo performed 08/2016.  Comfortable this morning appreciate assistance of physician assistant Tacy Dura.  Possible plans for discharge 11/27/2018  Blood pressure 151/49 pulse 60 temperature 97.7 O2 sats 99% 2 L nasal cannula  Sodium 135 potassium 4.0 chloride 100 CO2 27 BUN 26 creatinine 3.32 glucose 100 calcium 7.9 magnesium 1.9 WBC 5.6 hemoglobin 7.9 platelets 189  Status post blood transfusion 1 unit 11/22/2018  Amlodipine 10 mg daily, amiodarone 200 mg daily aspirin 81 mg daily, PhosLo 667 mg 3 times daily with meals, darbepoetin 100 mcg q. Monday subcutaneous.  Uloric 40 mg daily gabapentin 600 mg nightly, insulin sliding scale, Levemir 5 units nightly,  Protonix 40 mg daily, thyroid 120 mg daily  CT scan of chest 11/07/2018 showed bilateral multifocal airspace consolidation cardiac enlargement with 3 vessel coronary artery calcifications pulmonary edema and bilateral pleural effusions.  Chest x-ray 11/04/2018 right IJ with sheath in place prior CABG stable cardiomegaly left base atelectasis bilateral infiltrates with small left pleural effusion  Hemoglobin A1c 5.2 11/20/2018 PTH 114 10/29/2018   TSH 0.326 11/06/2018    Objective:  Vital signs in last 24 hours:  Temp:  [97.3 F (36.3 C)-97.7 F (36.5 C)] 97.7 F (36.5 C) (07/01 0352) Pulse Rate:  [57-70] 62 (06/30 1932) Resp:  [6-18] 10 (07/01 0352) BP: (109-151)/(37-63) 151/49 (07/01 0352) SpO2:  [88 %-100 %] 100 % (07/01 0352) Weight:  [79.1 kg] 79.1 kg  (07/01 0433)  Weight change: 15.1 kg Filed Weights   11/24/18 0600 11/25/18 0600 11/26/18 0433  Weight: 79.6 kg 64 kg 79.1 kg    Intake/Output: I/O last 3 completed shifts: In: 300 [P.O.:300] Out: 1 [Stool:1]   Intake/Output this shift:  No intake/output data recorded.  CVS- RRR midline sternotomy scar faint systolic murmur RS- CTA double-lumen catheter right IJ ABD- BS present soft non-distended EXT- no edema AV fistula right arm   Basic Metabolic Panel: Recent Labs  Lab 11/19/18 1320  11/21/18 0400 11/22/18 0350 11/23/18 0415 11/24/18 0527 11/25/18 0412  NA 135   < > 138 136 138 137 135  K 4.1   < > 5.2* 4.8 3.9 4.1 4.0  CL 100   < > 105 100 100 99 100  CO2 20*   < > 22 23 27 26 27   GLUCOSE 130*   < > 103* 107* 101* 111* 100*  BUN 95*   < > 49* 45* 31* 48* 26*  CREATININE 5.33*   < > 4.11* 3.90* 3.37* 4.81* 3.32*  CALCIUM 8.7*   < > 8.1* 8.1* 8.1* 8.2* 7.9*  MG  --    < > 2.2 2.2 2.1 2.2 1.9  PHOS 5.8*  --   --  6.7*  --   --   --    < > = values in this interval not displayed.    Liver Function Tests: Recent Labs  Lab 11/19/18 1320  ALBUMIN 2.9*   No results for input(s): LIPASE, AMYLASE in the last 168 hours. No results for input(s): AMMONIA in the  last 168 hours.  CBC: Recent Labs  Lab 11/21/18 0400 11/22/18 0450 11/23/18 0415 11/24/18 0527 11/25/18 0412  WBC 13.7* 8.7 6.9 6.2 5.6  HGB 9.1* 7.2* 8.0* 8.0* 7.9*  HCT 28.5* 23.4* 25.4* 26.5* 25.7*  MCV 93.1 96.7 95.5 98.5 97.0  PLT 254 165 165 185 189    Cardiac Enzymes: No results for input(s): CKTOTAL, CKMB, CKMBINDEX, TROPONINI in the last 168 hours.  BNP: Invalid input(s): POCBNP  CBG: Recent Labs  Lab 11/25/18 0656 11/25/18 1126 11/25/18 1521 11/25/18 2110 11/26/18 0630  GLUCAP 100* 196* 109* 114* 104*    Microbiology: Results for orders placed or performed during the hospital encounter of 11/06/18  SARS Coronavirus 2 (CEPHEID - Performed in [facility] hospital lab), Hosp  Order     Status: None   Collection Time: 11/06/18  1:30 PM   Specimen: Nasopharyngeal Swab  Result Value Ref Range Status   SARS Coronavirus 2 NEGATIVE NEGATIVE Final    Comment: (NOTE) If result is NEGATIVE SARS-CoV-2 target nucleic acids are NOT DETECTED. The SARS-CoV-2 RNA is generally detectable in upper and lower  respiratory specimens during the acute phase of infection. The lowest  concentration of SARS-CoV-2 viral copies this assay can detect is 250  copies / mL. A negative result does not preclude SARS-CoV-2 infection  and should not be used as the sole basis for treatment or other  patient management decisions.  A negative result may occur with  improper specimen collection / handling, submission of specimen other  than nasopharyngeal swab, presence of viral mutation(s) within the  areas targeted by this assay, and inadequate number of viral copies  (<250 copies / mL). A negative result must be combined with clinical  observations, patient history, and epidemiological information. If result is POSITIVE SARS-CoV-2 target nucleic acids are DETECTED. The SARS-CoV-2 RNA is generally detectable in upper and lower  respiratory specimens dur ing the acute phase of infection.  Positive  results are indicative of active infection with SARS-CoV-2.  Clinical  correlation with patient history and other diagnostic information is  necessary to determine patient infection status.  Positive results do  not rule out bacterial infection or co-infection with other viruses. If result is PRESUMPTIVE POSTIVE SARS-CoV-2 nucleic acids MAY BE PRESENT.   A presumptive positive result was obtained on the submitted specimen  and confirmed on repeat testing.  While 2019 novel coronavirus  (SARS-CoV-2) nucleic acids may be present in the submitted sample  additional confirmatory testing may be necessary for epidemiological  and / or clinical management purposes  to differentiate between  SARS-CoV-2  and other Sarbecovirus currently known to infect humans.  If clinically indicated additional testing with an alternate test  methodology [phone]) is advised. The SARS-CoV-2 RNA is generally  detectable in upper and lower respiratory sp ecimens during the acute  phase of infection. The expected result is Negative. Fact Sheet for Patients:  StrictlyIdeas.no Fact Sheet for Healthcare Providers: BankingDealers.co.za This test is not yet approved or cleared by the [place] FDA and has been authorized for detection and/or diagnosis of SARS-CoV-2 by FDA under an Emergency Use Authorization (EUA).  This EUA will remain in effect (meaning this test can be used) for the duration of the COVID-19 declaration under Section 564(b)(1) of the Act, 21 U.S.C. section 360bbb-3(b)(1), unless the authorization is terminated or revoked sooner. Performed at [facility], [address] [place], [place] [address]   MRSA PCR Screening     Status: None   Collection  Time: 11/07/18  6:09 AM   Specimen: Nasal Mucosa; Nasopharyngeal  Result Value Ref Range Status   MRSA by PCR NEGATIVE NEGATIVE Final    Comment:        The GeneXpert MRSA Assay (FDA approved for NASAL specimens only), is one component of a comprehensive MRSA colonization surveillance program. It is not intended to diagnose MRSA infection nor to guide or monitor treatment for MRSA infections. Performed at [facility], [address] [place], [place] [address]   Culture, blood (routine x 2)     Status: None   Collection Time: 11/07/18  7:38 AM   Specimen: Right Antecubital; Blood  Result Value Ref Range Status   Specimen Description   Final    RIGHT ANTECUBITAL BOTTLES DRAWN AEROBIC AND ANAEROBIC   Special Requests Blood Culture adequate volume  Final   Culture   Final    NO GROWTH 5 DAYS Performed at [facility], [address] [place], [place] [address]    Report Status 11/12/2018  FINAL  Final  Culture, blood (routine x 2)     Status: None   Collection Time: 11/07/18  7:38 AM   Specimen: BLOOD LEFT FOREARM  Result Value Ref Range Status   Specimen Description   Final    BLOOD LEFT FOREARM BOTTLES DRAWN AEROBIC AND ANAEROBIC   Special Requests Blood Culture adequate volume  Final   Culture   Final    NO GROWTH 5 DAYS Performed at [facility], [address] [place], [place] [address]    Report Status 11/12/2018 FINAL  Final  Surgical PCR screen     Status: None   Collection Time: 11/19/18  8:40 PM   Specimen: Nasal Mucosa; Nasal Swab  Result Value Ref Range Status   MRSA, PCR NEGATIVE NEGATIVE Final   Staphylococcus aureus NEGATIVE NEGATIVE Final    Comment: (NOTE) The Xpert SA Assay (FDA approved for NASAL specimens in patients 29 years of age and older), is one component of a comprehensive surveillance program. It is not intended to diagnose infection nor to guide or monitor treatment. Performed at [facility], [place] [address] [place],  [address]     Coagulation Studies: No results for input(s): LABPROT, INR in the last 72 hours.  Urinalysis: No results for input(s): COLORURINE, LABSPEC, PHURINE, GLUCOSEU, HGBUR, BILIRUBINUR, KETONESUR, PROTEINUR, UROBILINOGEN, NITRITE, LEUKOCYTESUR in the last 72 hours.  Invalid input(s): APPERANCEUR    Imaging: No results found.   Medications:   . sodium chloride    . lactated ringers     . amiodarone  200 mg Oral Daily  . amLODipine  10 mg Oral Daily  . aspirin EC  81 mg Oral Daily  . bisacodyl  10 mg Oral Daily   Or  . bisacodyl  10 mg Rectal Daily  . calcium acetate  667 mg Oral TID WC  . Chlorhexidine Gluconate Cloth  6 each Topical Q0600  . ciprofloxacin-dexamethasone  4 drop Left EAR BID  . darbepoetin (ARANESP) injection - NON-DIALYSIS  100 mcg Subcutaneous Q Mon-1800  . docusate sodium  200 mg Oral Daily  . febuxostat  40 mg Oral Daily  . gabapentin  600 mg Oral QHS  .  insulin aspart  0-5 Units Subcutaneous QHS  . insulin aspart  0-9 Units Subcutaneous TID WC  . insulin detemir  5 Units Subcutaneous QHS  . pantoprazole  40 mg Oral Daily  . sodium chloride flush  10-40 mL Intracatheter Q12H  . sodium  chloride flush  3 mL Intravenous Q12H  . thyroid  120 mg Oral Q0600   acetaminophen, metoprolol tartrate, ondansetron (ZOFRAN) IV, phenol, sodium chloride flush, sodium chloride flush  Assessment/ Plan:   End-stage renal disease Tuesday Thursday dialysis clipped to [facility] unit.  Will continue on schedule TTS.  Dialysis schedule 11/26/2018.  She is off schedule due to high dialysis volumes and did not receive dialysis 11/25/2018.  She is clipped to [facility] unit TTS  Anemia hemoglobin 8.0 low iron saturations 11/22/2018 with a transfusion 1 pack blood cells 11/22/2018.  We will continue to follow  Bones PTH 114 calcium phosphorus continue to follow phosphorus slightly elevated continues on binders.  Will check phosphorus with labs 11/26/2018  Nutrition pro-stat albumin 2.6  Hypertension controlled metoprolol 12.5 mg twice daily  Diabetes as per primary team glucose appears controlled at this point  Status post CABG appreciate input and help from Dr. Prescott Gum  Coronary artery disease continues aspirin no statin at this point.  To be addressed by primary service.  Patient still not on statin  Hypothyroidism on replacement therapy  Diastolic heart failure continues on amiodarone.  Hyperlipidemia recommend statin therapy per primary service.  Disposition hoping to discharge patient tomorrow.  If she is to be discharged I recommend that she is discharged early in order to present to her dialysis unit in [place] to complete paperwork this will need to be coordinated with our nephrology coordinator.    LOS: [place] @TODAY @7 :36 AM

## 2018-11-26 NOTE — Progress Notes (Addendum)
      [address]       [place] [address]             [phone]        6 Days Post-Op Procedure(s) (LRB): CORONARY ARTERY BYPASS GRAFTING (CABG), ON PUMP, TIMES four, USING LEFT INTERNAL MAMMARY ARTERY AND ENDOSCOPICALLY HARVESTED RIGHT GREATER SAPHENOUS VEIN (N/A) TRANSESOPHAGEAL ECHOCARDIOGRAM (TEE) (N/A)  Subjective: Patient had a large bowel movement yesterday. No further nausea or vomiting. She slept well and already walked.  Objective: Vital signs in last 24 hours: Temp:  [97.3 F (36.3 C)-97.7 F (36.5 C)] 97.7 F (36.5 C) (07/01 0352) Pulse Rate:  [57-70] 62 (06/30 1932) Cardiac Rhythm: Sinus bradycardia (06/30 2100) Resp:  [6-18] 10 (07/01 0352) BP: (109-151)/(37-63) 151/49 (07/01 0352) SpO2:  [88 %-100 %] 100 % (07/01 0352) Weight:  [79.1 kg] 79.1 kg (07/01 0433)  Pre op weight 75.7 kg Current Weight  11/26/18 79.1 kg       Intake/Output from previous day: 06/30 0701 - 07/01 0700 In: 300 [P.O.:300] Out: 1 [Stool:1]   Physical Exam:  Cardiovascular: RRR Pulmonary: Slightly diminished bibasilar breath sounds Abdomen: Soft, non tender, bowel sounds present. Extremities:Trace bilateral lower extremity edema. Wounds: Clean and dry.  No erythema or signs of infection.  Lab Results: CBC: Recent Labs    11/24/18 0527 11/25/18 0412  WBC 6.2 5.6  HGB 8.0* 7.9*  HCT 26.5* 25.7*  PLT 185 189   BMET:  Recent Labs    11/24/18 0527 11/25/18 0412  NA 137 135  K 4.1 4.0  CL 99 100  CO2 26 27  GLUCOSE 111* 100*  BUN 48* 26*  CREATININE 4.81* 3.32*  CALCIUM 8.2* 7.9*    PT/INR:  Lab Results  Component Value Date   INR 1.4 (H) 11/20/2018   INR 1.0 11/19/2018   INR 1.1 11/12/2018   ABG:  INR: Will add last result for INR, ABG once components are confirmed Will add last 4 CBG results once components are confirmed  Assessment/Plan: 1. CV-Previous a fib.  SR in the 60's. She is on Amiodarone 200 mg daily. She is  hypertensive this am. Will restart Amlodipine. 2. Pulmonary-On 2 liters of oxygen via . She likely will need oxygen at discharge. CXR ordered but not taken yet. Encourage incentive spirometer. 3. Anemia of chronic disease-H and H yesterday 7.9 and 25.7. On Aranesp. She has previously had transfusion. Await this am's lab. 4. CKD (stage III)-HD days usually T.TH.Sat. Creatinine 3.32 this am. Nephrology following and HD arranged for today. 5. DM-CBGs 109/114/104. On Insulin. Pre op HGA1C 5.2. 6. Hypothyroidism-continue Armour 120 mg daily 7. At patient request, stop Ultram as she only wants Tylenol 8. Not on statin secondary to previous intolerance. May be able to consider PCSK9 if cardiology in agreement after discharge 9. Possible discharge in am   Sharalyn Ink [facility] 11/26/2018,7:08 AM [phone]  Improving, consider d/c in am I have seen and examined Landry Mellow and agree with the above assessment  and plan.  Grace Isaac MD [facility] [phone] Office [phone] 11/26/2018 9:48 AM

## 2018-11-27 ENCOUNTER — Inpatient Hospital Stay (HOSPITAL_COMMUNITY): Payer: Medicare HMO

## 2018-11-27 DIAGNOSIS — Z951 Presence of aortocoronary bypass graft: Secondary | ICD-10-CM

## 2018-11-27 LAB — GLUCOSE, CAPILLARY
Glucose-Capillary: 100 mg/dL — ABNORMAL HIGH (ref 70–99)
Glucose-Capillary: 138 mg/dL — ABNORMAL HIGH (ref 70–99)
Glucose-Capillary: 120 mg/dL — ABNORMAL HIGH (ref 70–99)
Glucose-Capillary: 150 mg/dL — ABNORMAL HIGH (ref 70–99)

## 2018-11-27 LAB — SARS CORONAVIRUS 2 BY RT PCR (HOSPITAL ORDER, PERFORMED IN ~~LOC~~ HOSPITAL LAB): SARS Coronavirus 2: NEGATIVE

## 2018-11-27 MED ORDER — HYDRALAZINE HCL 25 MG PO TABS
25.0000 mg | ORAL_TABLET | Freq: Two times a day (BID) | ORAL | Status: DC
  Administered 2018-11-27 – 2018-11-30 (×6): 25 mg via ORAL
  Filled 2018-11-27 (×6): qty 1

## 2018-11-27 MED ORDER — CHLORHEXIDINE GLUCONATE CLOTH 2 % EX PADS
6.0000 | MEDICATED_PAD | Freq: Every day | CUTANEOUS | Status: DC
  Administered 2018-11-29 – 2018-11-30 (×2): 6 via TOPICAL

## 2018-11-27 MED ORDER — LIDOCAINE HCL 1 % IJ SOLN
INTRAMUSCULAR | Status: AC
  Filled 2018-11-27: qty 20

## 2018-11-27 NOTE — Progress Notes (Signed)
[facility] PROGRESS NOTE  Patient: [name] GFQ:[id number]   PCP: Doree Albee, MD DOB: [birth year]   DOA: 11/06/2018   DOS: 11/27/2018    Continue care per cardiothoracic surgery. Also appreciate assistance with cardiology and nephrology.  Appreciate their assistance in management.  Please call us with question.   Author: Berle Mull, MD [facility] Pager: [phone].   11/27/2018 2:37 PM   If 7PM-7AM, please contact night-coverage at [email or web address]

## 2018-11-27 NOTE — Progress Notes (Addendum)
Progress Note  Patient Name: [name] Date of Encounter: 11/27/2018  Primary Cardiologist: Carlyle Dolly, MD   Subjective   Up in chair with 02, needing thoracentesis for mod pl effusion  IR to eval.  Pt not discharging today.    Inpatient Medications    Scheduled Meds: . amiodarone  200 mg Oral Daily  . amLODipine  10 mg Oral Daily  . aspirin EC  81 mg Oral Daily  . bisacodyl  10 mg Oral Daily   Or  . bisacodyl  10 mg Rectal Daily  . calcium acetate  667 mg Oral TID WC  . Chlorhexidine Gluconate Cloth  6 each Topical Q0600  . ciprofloxacin-dexamethasone  4 drop Left EAR BID  . darbepoetin (ARANESP) injection - NON-DIALYSIS  100 mcg Subcutaneous Q Mon-1800  . docusate sodium  200 mg Oral Daily  . febuxostat  40 mg Oral Daily  . gabapentin  600 mg Oral QHS  . hydrALAZINE  25 mg Oral BID  . insulin aspart  0-5 Units Subcutaneous QHS  . insulin aspart  0-9 Units Subcutaneous TID WC  . insulin detemir  5 Units Subcutaneous QHS  . pantoprazole  40 mg Oral Daily  . sodium chloride flush  10-40 mL Intracatheter Q12H  . sodium chloride flush  3 mL Intravenous Q12H  . thyroid  120 mg Oral Q0600   Continuous Infusions: . sodium chloride    . lactated ringers     PRN Meds: acetaminophen, metoprolol tartrate, ondansetron (ZOFRAN) IV, phenol, sodium chloride flush, sodium chloride flush   Vital Signs    Vitals:   11/26/18 1457 11/26/18 1936 11/27/18 0027 11/27/18 0426  BP: (!) 133/40 (!) 137/56 (!) 149/50 (!) 167/47  Pulse: 76 74 75 75  Resp: 12 18 20 18   Temp: 98.4 F (36.9 C) 98.5 F (36.9 C) 98.2 F (36.8 C) 98.3 F (36.8 C)  TempSrc: Oral Oral Oral Oral  SpO2: 98% 98% 98% 99%  Weight:    76.7 kg  Height:        Intake/Output Summary (Last 24 hours) at 11/27/2018 0831 Last data filed at 11/26/2018 1700 Gross per 24 hour  Intake 360 ml  Output 1869 ml  Net -1509 ml   Last 3 Weights 11/27/2018 11/26/2018 11/26/2018  Weight (lbs) 169 lb 1.6 oz 169 lb 8.5 oz  174 lb 6.1 oz  Weight (kg) 76.703 kg 76.9 kg 79.1 kg      Telemetry    SR 5 beats NSVT starts as idio then NSVT   - Personally Reviewed  ECG    No new - Personally Reviewed  Physical Exam   GEN: No acute distress.   Neck: No JVD sitting up in chair  Cardiac: RRR, no murmurs, rubs, or gallops.  Respiratory: Clear to auscultation bilaterally except Lt base very diminished breath sounds. GI: Soft, nontender, non-distended  MS: No edema; No deformity. Neuro:  Nonfocal  Psych: Normal affect   Labs    High Sensitivity Troponin:  No results for input(s): TROPONINIHS in the last 720 hours.    Cardiac EnzymesNo results for input(s): TROPONINI in the last 168 hours. No results for input(s): TROPIPOC in the last 168 hours.   Chemistry Recent Labs  Lab 11/24/18 0527 11/25/18 0412 11/26/18 1059  NA 137 135 136  K 4.1 4.0 3.9  CL 99 100 101  CO2 26 27 24   GLUCOSE 111* 100* 143*  BUN 48* 26* 44*  CREATININE 4.81* 3.32* 5.22*  CALCIUM  8.2* 7.9* 8.5*  ALBUMIN  --   --  2.2*  GFRNONAA 8* 13* 7*  GFRAA 9* 15* 8*  ANIONGAP 12 8 11      Hematology Recent Labs  Lab 11/24/18 0527 11/25/18 0412 11/26/18 1059  WBC 6.2 5.6 5.2  RBC 2.69* 2.65* 2.74*  HGB 8.0* 7.9* 8.2*  HCT 26.5* 25.7* 26.9*  MCV 98.5 97.0 98.2  MCH 29.7 29.8 29.9  MCHC 30.2 30.7 30.5  RDW 16.8* 16.2* 15.6*  PLT 185 189 207    BNPNo results for input(s): BNP, PROBNP in the last 168 hours.   DDimer No results for input(s): DDIMER in the last 168 hours.   Radiology    Dg Chest 2 View  Result Date: 11/26/2018 CLINICAL DATA:  Pleural effusion EXAM: CHEST - 2 VIEW COMPARISON:  Two days ago FINDINGS: Small left pleural effusion that is stable. Small right pleural effusion in the lateral projection. Atelectatic type opacities at the bases. Further removal of hardware. No pneumothorax. Stable postoperative cardiopericardial enlargement. IMPRESSION: Atelectasis with small pleural effusions. Aeration is mildly  improved from 2 days ago. Electronically Signed   By: Monte Fantasia M.D.   On: 11/26/2018 09:32    Cardiac Studies   Cardiac cath 11/12/18  Prox RCA lesion is 50% stenosed.  Mid RCA to Dist RCA lesion is 80% stenosed.  Dist RCA lesion is 100% stenosed.  Prox Cx lesion is 99% stenosed.  Mid Cx lesion is 99% stenosed.  Dist Cx lesion is 90% stenosed.  1st Mrg lesion is 90% stenosed.  Ost LAD to Prox LAD lesion is 70% stenosed.  Mid LAD lesion is 30% stenosed.   1. 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 Hold Plavix.   CABG 11/20/18 PRE-OPERATIVE DIAGNOSIS:  CAD  POST-OPERATIVE DIAGNOSIS:  CAD  PROCEDURE:  Procedure(s): CORONARY ARTERY BYPASS GRAFTING (CABG), ON PUMP, TIMES four, USING LEFT INTERNAL MAMMARY ARTERY AND ENDOSCOPICALLY HARVESTED RIGHT GREATER SAPHENOUS VEIN (N/A) TRANSESOPHAGEAL ECHOCARDIOGRAM (TEE) (N/A)   Patient Profile     78 y.o. female with NSTEMI s/p CABG x 4, with atrial fibrillation, anemia, CKD on IHD, DM2, hypothyroidism, ESRD on dialysis.   Assessment & Plan    CAD with CABG X 4 POD #7  Bradycardia with pauses, BB held now maintaining SR  Brief NSVT   A fib 11/21/18 on amiodarone 200 mg daily, BB on hold.   --maintaining SR  HLD with statin intolerance will need PCSK9 inhibitor will send to lipid clinic appt being made   DM2  Per IM  ESRD on dialysis per renal  HTN  On amlodipine, hydralazine 25 BID just resumed today.    Hypothyroid on armour   Lt pl effusion for thoracentesis.   Hypoxia on 02 and will need at home.       For questions or updates, please contact [place] Please consult [email or web address] for contact info under        Signed, Cecilie Kicks, NP  11/27/2018, 8:31 AM     ---------------------------------------------------------------------------------------------   History and all data above reviewed.  Patient examined.  I agree with the findings as above.  [name] was unable to have O2 weaned yesterday and has a moderate pleural effusion which will evaluated for thoracentesis.   Constitutional: No acute distress ENMT: normal dentition, moist mucous membranes Cardiovascular: regular rhythm, normal rate,  no murmurs. S1 and S2 normal. Radial pulses normal bilaterally. No jugular venous distention.  Chest: well healing sternotomy incision. Respiratory: diminished in left lung base GI : normal bowel sounds, soft and nontender. No distention.   MSK: extremities warm, well perfused. No edema.  NEURO: grossly nonfocal exam, moves all extremities. PSYCH: alert and oriented x 3, normal mood and affect.   All available labs, radiology testing, previous records reviewed. Agree with documented assessment and plan of my colleague as stated above with the following additions or changes:  Principal Problem:   Non-ST elevation (NSTEMI) myocardial infarction [facility]) Active Problems:   HTN (hypertension)   Hyperlipidemia associated with type 2 diabetes mellitus ([place])   Essential hypertension, benign   Hypothyroidism   ESRD on dialysis ([place])   S/P CABG x 4   CAD (coronary artery disease)   Acute on chronic diastolic CHF (congestive heart failure) ([facility])   Respiratory failure with hypoxia ([facility])   End-stage renal disease on hemodialysis ([place])   Diabetes mellitus type 2, controlled, with complications ([place])   Pressure injury of skin    Coronary artery disease status post CABG Continue ASA 81 mg, had pauses on telemetry 2 days ago, holding metoprolol.   Afib on 11/21/2018 pm  - currently in sinus rhythm with occasional bradycardia, continue amiodarone 200 mg daily currently. Holding metoprolol.  Hyperlipidemia with statin intolerance Will arrange for  discussion of alternate therapy on hospital discharge, PCSK9 inhibitor possible.   Non-ST elevation myocardial infarction -CABG for revascularization. -Cardiac rehab when able  End-stage renal disease -per nephrology who is following.  DM2 - per IM.   HTN - amlopdine added back, hydralazine 25 mg BID added today.   Left pleural effusion - possible thoracentesis.   Time Spent Directly with Patient:  I have spent a total of 25 minutes with the patient reviewing hospital notes, telemetry, EKGs, labs and examining the patient as well as establishing an assessment and plan that was discussed personally with the patient.  > 50% of time was spent in direct patient care.  Length of Stay:  LOS: 20 days   Elouise Munroe, MD [facility] 1:07 PM  11/27/2018

## 2018-11-27 NOTE — Progress Notes (Signed)
Patient brought to radiology for possible thoracentesis.  Limited US Chest demonstrates a small amount of fluid on the left chest not amenable to thoracentesis at this time.   Brynda Greathouse, MS RD PA-C 5:23 PM

## 2018-11-27 NOTE — Progress Notes (Signed)
CARDIAC REHAB PHASE I   PRE:  Rate/Rhythm: 80 SR  BP:  Sitting: 144/50      SaO2: 90 RA  MODE:  Ambulation: 300 ft   POST:  Rate/Rhythm: 105 ST  BP:  Sitting: 145/55    SaO2: 92 RA   Pt ambulated 364ft in hallway standby assist with front wheel walker and gait belt. Pt took several short standing rest breaks c/o SOB. Pt maintained sats on room air, coached through purse lipped breathing. Pt returned to recliner.  Pt educated to shower and monitor incisions daily. Encouraged continued walks, IS use, and sternal precautions. Pt given in-the-tube sheet. Pt on renal diet with dialysis, pt encouraged to follow that. Will refer to CRP [facility] [place]. Pt agreeable to Virtual Cardiac Rehab.    Pt is interested in participating in Virtual Cardiac Rehab. Pt advised that Virtual Cardiac Rehab is provided at no cost to the patient.  Checklist:  1. Pt has smart device  ie smartphone and/or ipad for downloading an app  Yes 2. Reliable internet/wifi service    Yes 3. Understands how to use their smartphone and navigate within an app.  Yes   Reviewed with pt the scheduling process for virtual cardiac rehab.  Pt verbalized understanding.   [id number] [name], RN BSN 11/27/2018 11:04 AM

## 2018-11-27 NOTE — Care Management Important Message (Signed)
Important Message  Patient Details  Name: [name] MRN: [id number] Date of Birth: 1940/07/20   Medicare Important Message Given:  Yes     [name] 11/27/2018, 2:23 PM

## 2018-11-27 NOTE — Progress Notes (Signed)
SATURATION QUALIFICATIONS: (This note is used to comply with regulatory documentation for home oxygen)  Patient Saturations on Room Air at Rest = 90%  Patient Saturations on Room Air while Ambulating = 92-96%  Please briefly explain why patient needs home oxygen: Pt does not need oxygen at home, pt able to maintain sats on room air.   [name], RN BSN 11/27/2018 11:19 AM

## 2018-11-27 NOTE — TOC Progression Note (Signed)
Transition of Care (TOC) - Progression Note  Marvetta Gibbons RN, BSN Transitions of Care Unit 4E- RN Case Manager [phone]   Patient Details  Name: [name] MRN: [id number] Date of Birth: Aug 18, 1940  Transition of Care [facility]) CM/SW Contact  Dahlia Client, Romeo Rabon, RN Phone Number: 11/27/2018, 4:20 PM  Clinical Narrative:    Referral received for [facility] and home 02 needs- CM spoke with pt at bedside and provided list Per CMS guidelines from medicare.gov website with star ratings (copy placed in chart) for [facility] choice- per pt she would like to use [facility] or [facility]- she reports having a rollator and shower chair at home- however would like a 3n1 for home- order placed and notified Zach with [facility] for DME need- order also placed for home 02- however at this time pt does not qualify for home 02- this was explained to pt and she states that she would not want to pay out of pocket for home 02 if insurance does not cover. CM will follow for 02 needs if this should change. Referral called to Dorian Pod with [facility] for [facility]- referral declined- Call made to [facility] with [facility] for HHRN/PT- awaiting return call.    Expected Discharge Plan: [place] Barriers to Discharge: Continued Medical Work up  Expected Discharge Plan and Services Expected Discharge Plan: [place] In-house Referral: Clinical Social Work Discharge Planning Services: CM Consult Post Acute Care Choice: Durable Medical Equipment, Home Health Living arrangements for the past 2 months: Single Family Home                 DME Arranged: 3-N-1, Oxygen DME Agency: [employer] Date DME Agency Contacted: 11/27/18 Time DME Agency Contacted: [phone] Representative spoke with at DME Agency: Andree Coss HH Arranged: RN, PT           Social Determinants of Health ([place]) Interventions    Readmission Risk Interventions Readmission Risk Prevention Plan 11/27/2018 11/11/2018  Transportation Screening  Complete Complete  Social Work Consult for [place] Planning/Counseling - [place] - Not Applicable  Medication Review Press photographer) Complete -  [place] or Home Care Consult Complete -  SW Recovery Care/Counseling Consult Complete -  Palliative Care Screening Not Applicable -  [place] Not Applicable -  Some recent data might be hidden

## 2018-11-27 NOTE — Progress Notes (Signed)
RN called IR to inquire about pt's ordered thoracentesis.  IR informed this RN that pt was required to be tested for covid within 72 hours of undergoing thoracentesis.  PA was notified.  Will continue to monitor.

## 2018-11-27 NOTE — Progress Notes (Signed)
[facility] ROUNDING NOTE   Subjective:   This is a 78 year old lady that has a history of end-stage renal disease she is scheduled Tuesday Thursday Saturday dialysis at [facility].  She was a new start 11/07/2018.  Her last dialysis treatment was 11/26/2018 with removal of 1.8 L.  She underwent coronary artery bypass grafting 11/20/2018.  She has a history of congestive heart failure with an ejection fraction of 50 to 55% and inferior wall hypokinesis.  2D echo performed 08/2016.  Appears to be comfortable this morning still requiring oxygen appreciate assistance from Dr. Roxan Hockey.  Patient not quite ready for discharge  Blood pressure 149/52 pulse 76 temperature 98.4 O2 sats 99% 2 L nasal cannula   Sodium 136 potassium 3.9 chloride 101 CO2 24 BUN 44 creatinine 5.22 glucose 143 calcium 8.5 phosphorus 5.1 magnesium 2.2 albumin 2.2 WBC 5.2 hemoglobin 8.2 platelets of 207  Status post blood transfusion 1 unit 11/22/2018  Amlodipine 10 mg daily, amiodarone 200 mg daily aspirin 81 mg daily, PhosLo 667 mg 3 times daily with meals, darbepoetin 100 mcg q. Monday subcutaneous.  Uloric 40 mg daily gabapentin 600 mg nightly, insulin sliding scale, Levemir 5 units nightly,  Protonix 40 mg daily, thyroid 120 mg daily.  Hydralazine 25 mg twice daily,  CT scan of chest 11/07/2018 showed bilateral multifocal airspace consolidation cardiac enlargement with 3 vessel coronary artery calcifications pulmonary edema and bilateral pleural effusions.  Chest x-ray 11/26/2018 atrial ectasis with small pleural effusion aeration mildly improved.  Hemoglobin A1c 5.2 11/20/2018 PTH 114 10/29/2018   TSH 0.326 11/06/2018    Objective:  Vital signs in last 24 hours:  Temp:  [97.8 F (36.6 C)-98.7 F (37.1 C)] 98.4 F (36.9 C) (07/02 0826) Pulse Rate:  [63-76] 76 (07/02 0826) Resp:  [12-20] 18 (07/02 0426) BP: (99-167)/(35-56) 149/52 (07/02 0826) SpO2:  [98 %-100 %] 99 % (07/02 0826) Weight:  [76.7 kg-79.1  kg] 76.7 kg (07/02 0426)  Weight change: 0 kg Filed Weights   11/26/18 1040 11/26/18 1345 11/27/18 0426  Weight: 79.1 kg 76.9 kg 76.7 kg    Intake/Output: I/O last 3 completed shifts: In: 660 [P.O.:660] Out: 1869 [Other:1869]   Intake/Output this shift:  No intake/output data recorded.  CVS- RRR midline sternotomy scar faint systolic murmur RS- CTA double-lumen catheter right IJ ABD- BS present soft non-distended EXT- no edema AV fistula right arm   Basic Metabolic Panel: Recent Labs  Lab 11/22/18 0350 11/23/18 0415 11/24/18 0527 11/25/18 0412 11/26/18 1059  NA 136 138 137 135 136  K 4.8 3.9 4.1 4.0 3.9  CL 100 100 99 100 101  CO2 23 27 26 27 24   GLUCOSE 107* 101* 111* 100* 143*  BUN 45* 31* 48* 26* 44*  CREATININE 3.90* 3.37* 4.81* 3.32* 5.22*  CALCIUM 8.1* 8.1* 8.2* 7.9* 8.5*  MG 2.2 2.1 2.2 1.9 2.2  PHOS 6.7*  --   --   --  5.1*    Liver Function Tests: Recent Labs  Lab 11/26/18 1059  ALBUMIN 2.2*   No results for input(s): LIPASE, AMYLASE in the last 168 hours. No results for input(s): AMMONIA in the last 168 hours.  CBC: Recent Labs  Lab 11/22/18 0450 11/23/18 0415 11/24/18 0527 11/25/18 0412 11/26/18 1059  WBC 8.7 6.9 6.2 5.6 5.2  HGB 7.2* 8.0* 8.0* 7.9* 8.2*  HCT 23.4* 25.4* 26.5* 25.7* 26.9*  MCV 96.7 95.5 98.5 97.0 98.2  PLT 165 165 185 189 207    Cardiac Enzymes: No  results for input(s): CKTOTAL, CKMB, CKMBINDEX, TROPONINI in the last 168 hours.  BNP: Invalid input(s): POCBNP  CBG: Recent Labs  Lab 11/25/18 2110 11/26/18 0630 11/26/18 1457 11/26/18 2111 11/27/18 0632  GLUCAP 114* 104* 131* 125* 100*    Microbiology: Results for orders placed or performed during the hospital encounter of 11/06/18  SARS Coronavirus 2 (CEPHEID - Performed in [place] hospital lab), Hosp Order     Status: None   Collection Time: 11/06/18  1:30 PM   Specimen: Nasopharyngeal Swab  Result Value Ref Range Status   SARS Coronavirus 2  NEGATIVE NEGATIVE Final    Comment: (NOTE) If result is NEGATIVE SARS-CoV-2 target nucleic acids are NOT DETECTED. The SARS-CoV-2 RNA is generally detectable in upper and lower  respiratory specimens during the acute phase of infection. The lowest  concentration of SARS-CoV-2 viral copies this assay can detect is 250  copies / mL. A negative result does not preclude SARS-CoV-2 infection  and should not be used as the sole basis for treatment or other  patient management decisions.  A negative result may occur with  improper specimen collection / handling, submission of specimen other  than nasopharyngeal swab, presence of viral mutation(s) within the  areas targeted by this assay, and inadequate number of viral copies  (<250 copies / mL). A negative result must be combined with clinical  observations, patient history, and epidemiological information. If result is POSITIVE SARS-CoV-2 target nucleic acids are DETECTED. The SARS-CoV-2 RNA is generally detectable in upper and lower  respiratory specimens dur ing the acute phase of infection.  Positive  results are indicative of active infection with SARS-CoV-2.  Clinical  correlation with patient history and other diagnostic information is  necessary to determine patient infection status.  Positive results do  not rule out bacterial infection or co-infection with other viruses. If result is PRESUMPTIVE POSTIVE SARS-CoV-2 nucleic acids MAY BE PRESENT.   A presumptive positive result was obtained on the submitted specimen  and confirmed on repeat testing.  While 2019 novel coronavirus  (SARS-CoV-2) nucleic acids may be present in the submitted sample  additional confirmatory testing may be necessary for epidemiological  and / or clinical management purposes  to differentiate between  SARS-CoV-2 and other Sarbecovirus currently known to infect humans.  If clinically indicated additional testing with an alternate test  methodology [id number])  is advised. The SARS-CoV-2 RNA is generally  detectable in upper and lower respiratory sp ecimens during the acute  phase of infection. The expected result is Negative. Fact Sheet for Patients:  StrictlyIdeas.no Fact Sheet for Healthcare Providers: BankingDealers.co.za This test is not yet approved or cleared by the [place] FDA and has been authorized for detection and/or diagnosis of SARS-CoV-2 by FDA under an Emergency Use Authorization (EUA).  This EUA will remain in effect (meaning this test can be used) for the duration of the COVID-19 declaration under Section 564(b)(1) of the Act, 21 U.S.C. section 360bbb-3(b)(1), unless the authorization is terminated or revoked sooner. Performed at [facility], [address], [place], [place] [address]   MRSA PCR Screening     Status: None   Collection Time: 11/07/18  6:09 AM   Specimen: Nasal Mucosa; Nasopharyngeal  Result Value Ref Range Status   MRSA by PCR NEGATIVE NEGATIVE Final    Comment:        The GeneXpert MRSA Assay (FDA approved for NASAL specimens only), is one component of a comprehensive MRSA colonization surveillance program. It is not intended  to diagnose MRSA infection nor to guide or monitor treatment for MRSA infections. Performed at [facility], [address], [place], [place] [address]   Culture, blood (routine x 2)     Status: None   Collection Time: 11/07/18  7:38 AM   Specimen: Right Antecubital; Blood  Result Value Ref Range Status   Specimen Description   Final    RIGHT ANTECUBITAL BOTTLES DRAWN AEROBIC AND ANAEROBIC   Special Requests Blood Culture adequate volume  Final   Culture   Final    NO GROWTH 5 DAYS Performed at [facility], [address] [place], [place] [address]    Report Status 11/12/2018 FINAL  Final  Culture, blood (routine x 2)     Status: None   Collection Time: 11/07/18  7:38 AM   Specimen: BLOOD LEFT FOREARM  Result Value  Ref Range Status   Specimen Description   Final    BLOOD LEFT FOREARM BOTTLES DRAWN AEROBIC AND ANAEROBIC   Special Requests Blood Culture adequate volume  Final   Culture   Final    NO GROWTH 5 DAYS Performed at [facility], [address] [place], [place] [address]    Report Status 11/12/2018 FINAL  Final  Surgical PCR screen     Status: None   Collection Time: 11/19/18  8:40 PM   Specimen: Nasal Mucosa; Nasal Swab  Result Value Ref Range Status   MRSA, PCR NEGATIVE NEGATIVE Final   Staphylococcus aureus NEGATIVE NEGATIVE Final    Comment: (NOTE) The Xpert SA Assay (FDA approved for NASAL specimens in patients 58 years of age and older), is one component of a comprehensive surveillance program. It is not intended to diagnose infection nor to guide or monitor treatment. Performed at [facility], [place] [address] [place], [place] [address]     Coagulation Studies: No results for input(s): LABPROT, INR in the last 72 hours.  Urinalysis: No results for input(s): COLORURINE, LABSPEC, PHURINE, GLUCOSEU, HGBUR, BILIRUBINUR, KETONESUR, PROTEINUR, UROBILINOGEN, NITRITE, LEUKOCYTESUR in the last 72 hours.  Invalid input(s): APPERANCEUR    Imaging: Dg Chest 2 View  Result Date: 11/26/2018 CLINICAL DATA:  Pleural effusion EXAM: CHEST - 2 VIEW COMPARISON:  Two days ago FINDINGS: Small left pleural effusion that is stable. Small right pleural effusion in the lateral projection. Atelectatic type opacities at the bases. Further removal of hardware. No pneumothorax. Stable postoperative cardiopericardial enlargement. IMPRESSION: Atelectasis with small pleural effusions. Aeration is mildly improved from 2 days ago. Electronically Signed   By: Monte Fantasia M.D.   On: 11/26/2018 09:32     Medications:   . sodium chloride    . lactated ringers     . amiodarone  200 mg Oral Daily  . amLODipine  10 mg Oral Daily  . aspirin EC  81 mg Oral Daily  . bisacodyl  10 mg Oral Daily   Or   . bisacodyl  10 mg Rectal Daily  . calcium acetate  667 mg Oral TID WC  . Chlorhexidine Gluconate Cloth  6 each Topical Q0600  . ciprofloxacin-dexamethasone  4 drop Left EAR BID  . darbepoetin (ARANESP) injection - NON-DIALYSIS  100 mcg Subcutaneous Q Mon-1800  . docusate sodium  200 mg Oral Daily  . febuxostat  40 mg Oral Daily  . gabapentin  600 mg Oral QHS  . hydrALAZINE  25 mg Oral BID  . insulin aspart  0-5 Units Subcutaneous QHS  . insulin aspart  0-9 Units Subcutaneous TID WC  .  insulin detemir  5 Units Subcutaneous QHS  . pantoprazole  40 mg Oral Daily  . sodium chloride flush  10-40 mL Intracatheter Q12H  . sodium chloride flush  3 mL Intravenous Q12H  . thyroid  120 mg Oral Q0600   acetaminophen, metoprolol tartrate, ondansetron (ZOFRAN) IV, phenol, sodium chloride flush, sodium chloride flush  Assessment/ Plan:   End-stage renal disease Tuesday Thursday dialysis clipped to [facility] unit.  Will continue on schedule TTS.  Dialysis schedule 11/26/2018.  She is off schedule due to high dialysis volumes and did not receive dialysis 11/25/2018.  She is clipped to [facility] unit TTS.  We shall schedule dialysis 11/28/2018  Anemia hemoglobin 8.2 low iron saturations 11/22/2018 with a transfusion 1 pack blood cells 11/22/2018.  We will continue to follow she is receiving darbepoetin  Bones PTH 114 calcium phosphorus continue to follow phosphorus slightly elevated continues on binders.    Nutrition pro-stat low albumin 2.2  Hypertension controlled added hydralazine 25 mg twice daily  Diabetes as per primary team glucose appears controlled at this point  Status post CABG appreciate input and help from cardiovascular team  Coronary artery disease continues aspirin no statin due to intolerance  Hypothyroidism on replacement therapy  Diastolic heart failure continues on amiodarone.  Hyperlipidemia recommend statin therapy per primary service.  Disposition not quite ready for  discharge    LOS: [place] @TODAY @9 :04 AM

## 2018-11-27 NOTE — Progress Notes (Addendum)
[address]       [place] [address]             [phone]        7 Days Post-Op Procedure(s) (LRB): CORONARY ARTERY BYPASS GRAFTING (CABG), ON PUMP, TIMES four, USING LEFT INTERNAL MAMMARY ARTERY AND ENDOSCOPICALLY HARVESTED RIGHT GREATER SAPHENOUS VEIN (N/A) TRANSESOPHAGEAL ECHOCARDIOGRAM (TEE) (N/A)  Subjective: She has already walked this am but got weak. She has occasional incisional pain.  Objective: Vital signs in last 24 hours: Temp:  [97.8 F (36.6 C)-98.7 F (37.1 C)] 98.3 F (36.8 C) (07/02 0426) Pulse Rate:  [63-76] 75 (07/02 0426) Cardiac Rhythm: Normal sinus rhythm (07/02 0700) Resp:  [12-20] 18 (07/02 0426) BP: (99-167)/(35-56) 167/47 (07/02 0426) SpO2:  [98 %-100 %] 99 % (07/02 0426) Weight:  [76.7 kg-79.1 kg] 76.7 kg (07/02 0426)  Pre op weight 75.7 kg Current Weight  11/27/18 76.7 kg       Intake/Output from previous day: 07/01 0701 - 07/02 0700 In: 360 [P.O.:360] Out: 1869    Physical Exam:  Cardiovascular: RRR Pulmonary: Slightly diminished bibasilar breath sounds Abdomen: Soft, non tender, bowel sounds present. Extremities:Trace bilateral lower extremity edema. Wounds: Clean and dry.  No erythema or signs of infection.  Lab Results: CBC: Recent Labs    11/25/18 0412 11/26/18 1059  WBC 5.6 5.2  HGB 7.9* 8.2*  HCT 25.7* 26.9*  PLT 189 207   BMET:  Recent Labs    11/25/18 0412 11/26/18 1059  NA 135 136  K 4.0 3.9  CL 100 101  CO2 27 24  GLUCOSE 100* 143*  BUN 26* 44*  CREATININE 3.32* 5.22*  CALCIUM 7.9* 8.5*    PT/INR:  Lab Results  Component Value Date   INR 1.4 (H) 11/20/2018   INR 1.0 11/19/2018   INR 1.1 11/12/2018   ABG:  INR: Will add last result for INR, ABG once components are confirmed Will add last 4 CBG results once components are confirmed  Assessment/Plan: 1. CV-Previous a fib.  SR in the 60's. She is on Amiodarone 200 mg daily and Amlodipine 10 mg daily. She is  hypertensive again this am. Will restart Hydralazine. 2. Pulmonary-On 2 liters of oxygen via [place]. She likely will need oxygen at discharge as continues to desat with ambulation. Encourage incentive spirometer. 3. Anemia of chronic disease-H and H yesterday 8.2 and 26.9. On Aranesp. She has previously had transfusion. Await this am's lab. 4. CKD (stage III)-HD days to be T.TH.Sat. Creatinine yesterday 5.22 this am. Nephrology following and HD arranged for today. 5. DM-CBGs 131/125/100. On Insulin. Pre op HGA1C 5.2. 6. Hypothyroidism-continue [place] 120 mg daily 7. Not on statin secondary to previous intolerance. May be able to consider PCSK9 if cardiology in agreement after discharge 8. Patient will be new to outpatient HD in [place] and needs to complete paperwork;therefore she has to be discharged early if she is discharged on HD day. As discussed with Dr. Roxan Hockey, she is still very deconditioned and was feeling weak on her am walk. She will remain here 1-2 days and then hopefully be ready for discharge. Will defer HD days to nephrology while in [place] 9. Will arrange [facility] and ask PT to eval for possible home PT   Sharalyn Ink ZimmermanPA-C 11/27/2018,7:30 AM [phone]  Patient seen and examined, agree with above She is not quite ready to go home yet, but hopefully will be in a day or two Will arrange for [facility]  at dc She has what appears to be a moderate pleural effusion on CXR, will ask IR to do an ultrasound guided thoracentesis if there is enough fluid to tap  [place] C. Roxan Hockey, MD [facility] [phone]

## 2018-11-28 LAB — BASIC METABOLIC PANEL
Anion gap: 12 (ref 5–15)
BUN: 45 mg/dL — ABNORMAL HIGH (ref 8–23)
CO2: 25 mmol/L (ref 22–32)
Calcium: 8.6 mg/dL — ABNORMAL LOW (ref 8.9–10.3)
Chloride: 98 mmol/L (ref 98–111)
Creatinine, Ser: 5.45 mg/dL — ABNORMAL HIGH (ref 0.44–1.00)
GFR calc Af Amer: 8 mL/min — ABNORMAL LOW (ref >60)
GFR calc non Af Amer: 7 mL/min — ABNORMAL LOW (ref >60)
Glucose, Bld: 101 mg/dL — ABNORMAL HIGH (ref 70–99)
Potassium: 3.8 mmol/L (ref 3.5–5.1)
Sodium: 135 mmol/L (ref 135–145)

## 2018-11-28 LAB — GLUCOSE, CAPILLARY
Glucose-Capillary: 102 mg/dL — ABNORMAL HIGH (ref 70–99)
Glucose-Capillary: 107 mg/dL — ABNORMAL HIGH (ref 70–99)
Glucose-Capillary: 181 mg/dL — ABNORMAL HIGH (ref 70–99)
Glucose-Capillary: 185 mg/dL — ABNORMAL HIGH (ref 70–99)
Glucose-Capillary: 118 mg/dL — ABNORMAL HIGH (ref 70–99)
Glucose-Capillary: 141 mg/dL — ABNORMAL HIGH (ref 70–99)

## 2018-11-28 MED ORDER — ACETAMINOPHEN 325 MG PO TABS: 650.0000 mg | ORAL_TABLET | Freq: Four times a day (QID) | ORAL | Status: DC | PRN

## 2018-11-28 MED ORDER — METOPROLOL SUCCINATE ER 25 MG PO TB24: 12.5000 mg | ORAL_TABLET | Freq: Every day | ORAL | 11 refills | Status: DC

## 2018-11-28 MED ORDER — PROMETHAZINE HCL 25 MG PO TABS: 12.5000 mg | ORAL_TABLET | Freq: Four times a day (QID) | ORAL | Status: DC | PRN

## 2018-11-28 MED ORDER — TRAMADOL HCL 50 MG PO TABS: 50.0000 mg | ORAL_TABLET | Freq: Two times a day (BID) | ORAL | 0 refills | Status: DC | PRN

## 2018-11-28 MED ORDER — VITAMIN C 100 MG PO TABS: 100.0000 mg | ORAL_TABLET | Freq: Every day | ORAL | Status: DC

## 2018-11-28 MED ORDER — AMIODARONE HCL 200 MG PO TABS: 200.0000 mg | ORAL_TABLET | Freq: Every day | ORAL | 1 refills | Status: DC

## 2018-11-28 MED ORDER — DOCUSATE SODIUM 100 MG PO CAPS: 100.0000 mg | ORAL_CAPSULE | Freq: Every day | ORAL | Status: DC

## 2018-11-28 MED ORDER — HYDRALAZINE HCL 25 MG PO TABS: 25.0000 mg | ORAL_TABLET | Freq: Two times a day (BID) | ORAL | 1 refills | Status: DC

## 2018-11-28 MED ORDER — ASPIRIN 81 MG PO TBEC: 81.0000 mg | DELAYED_RELEASE_TABLET | Freq: Every day | ORAL | Status: AC

## 2018-11-28 MED ORDER — HEPARIN SODIUM (PORCINE) 1000 UNIT/ML IJ SOLN
INTRAMUSCULAR | Status: AC
  Filled 2018-11-28: qty 4

## 2018-11-28 NOTE — Plan of Care (Signed)
POC initiated and progressing. 

## 2018-11-28 NOTE — Progress Notes (Signed)
Pt's heart rate increased to the 130's 140's while at rest with an afib rhythm, sustaining.  Pt had been given her daily po amio shortly before.  Metoprolol 2.5 IV was administered.  Patient complained of nausea and weakness.  PA was paged.  EKG was completed.  Pt returned to NSR w/rate in the 80's.  Will continue to monitor.

## 2018-11-28 NOTE — Progress Notes (Addendum)
      [address]       [place] [address]             [phone]       Brief episode of atrial fibrillation rate in the 130s-140s. Converted with daily dose of Amiodarone of 200mg  and Lopressor 2.5mg  x 1. Now maintaining NSR but Nauseated. Took zofran without much relief. Will order phenergan PRN as an alternative.   EKG ordered. Continue to monitor closely. Please call if further arrhythmias occur. Cardiology paged.   Nicholes Rough, PA-C

## 2018-11-28 NOTE — Progress Notes (Addendum)
[address]       [place] [address]             [phone]        8 Days Post-Op Procedure(s) (LRB): CORONARY ARTERY BYPASS GRAFTING (CABG), ON PUMP, TIMES four, USING LEFT INTERNAL MAMMARY ARTERY AND ENDOSCOPICALLY HARVESTED RIGHT GREATER SAPHENOUS VEIN (N/A) TRANSESOPHAGEAL ECHOCARDIOGRAM (TEE) (N/A)  Subjective: She is about to go for HD. She states she is feeling a little stronger and breathing fairly well.  Objective: Vital signs in last 24 hours: Temp:  [98.4 F (36.9 C)-98.9 F (37.2 C)] 98.6 F (37 C) (07/03 0348) Pulse Rate:  [76-81] 78 (07/03 0348) Cardiac Rhythm: Normal sinus rhythm (07/03 0430) Resp:  [15-19] 17 (07/03 0348) BP: (133-149)/(47-52) 133/50 (07/03 0348) SpO2:  [94 %-99 %] 94 % (07/03 0348) Weight:  [77.4 kg] 77.4 kg (07/03 0348)  Pre op weight 75.7 kg Current Weight  11/28/18 77.4 kg      Intake/Output from previous day: 07/02 0701 - 07/03 0700 In: 400 [P.O.:400] Out: -    Physical Exam:  Cardiovascular: RRR Pulmonary: Slightly diminished left basilar breath sound Abdomen: Soft, non tender, bowel sounds present. Extremities:Trace bilateral lower extremity edema. Wounds: Clean and dry.  No erythema or signs of infection.  Lab Results: CBC: Recent Labs    11/26/18 1059  WBC 5.2  HGB 8.2*  HCT 26.9*  PLT 207   BMET:  Recent Labs    11/26/18 1059 11/28/18 0446  NA 136 135  K 3.9 3.8  CL 101 98  CO2 24 25  GLUCOSE 143* 101*  BUN 44* 45*  CREATININE 5.22* 5.45*  CALCIUM 8.5* 8.6*    PT/INR:  Lab Results  Component Value Date   INR 1.4 (H) 11/20/2018   INR 1.0 11/19/2018   INR 1.1 11/12/2018   ABG:  INR: Will add last result for INR, ABG once components are confirmed Will add last 4 CBG results once components are confirmed  Assessment/Plan: 1. CV-Previous a fib.  SR in the 70's. She is on Amiodarone 200 mg daily and Amlodipine 10 mg daily and Hydralazine.25 mg bid. BP hypertensive this  am.  2. Pulmonary-On 2 liters of oxygen via [place] this am but it is for her comfort. Her oxygenation is in the 90's off oxygen, even with ambulation. She will NOT need oxygen at discharge. IR was consulted yesterday for possible right thoracentesis. Because there was not much pleural fluid, thoracentesis was not done. Encourage incentive spirometer. 3. Anemia of chronic disease-H and H yesterday 8.2 and 26.9. On Aranesp. She has previously had transfusion. Await this am's lab. 4. CKD (stage III)-HD days to be T.TH.Sat. Creatinine yesterday 5.45 this am. Nephrology following and HD arranged for today. 5. DM-CBGs 120/150/102. On Insulin. Pre op HGA1C 5.2. 6. Hypothyroidism-continue Armour 120 mg daily 7. Not on statin secondary to previous intolerance. May be able to consider PCSK9 if cardiology in agreement after discharge 8. Patient will be new to outpatient HD in [place] and needs to complete paperwork;therefore she has to be discharged early if she is discharged on HD day.  9.  HH and ask PT to eval for possible home PT. Will discuss with Dr. Roxan Hockey, [place] today (has HD this am) vs am.   Sharalyn Ink [facility] 11/28/2018,7:28 AM [phone]  Patient seen and examined, issues as detailed above She ambulated 3 x yesterday afternoon without difficulty Dc later today after HD or in AM  Remo Lipps  Chaya Jan, MD [facility] [phone]

## 2018-11-28 NOTE — Progress Notes (Signed)
PT Cancellation Note  Patient Details Name: [name] MRN: [id number] DOB: 10/08/1940   Cancelled Treatment:    Reason Eval/Treat Not Completed: Patient not medically ready.  RN asks to hold until 7/4 am.  Pt having heart rhythm changes. 11/28/2018  Donnella Sham, [facility] [phone]  (pager) [phone]  (office)   Tessie Fass Brentyn Seehafer 11/28/2018, 3:32 PM

## 2018-11-28 NOTE — Progress Notes (Signed)
[facility] will sign off.   Medication Recommendations:  Continue ASA, amiodarone (ideally short term), consider PCSK-9 inhibitor therapy in outpatient setting. Being dc on low dose Toprol-XL (monitor for bradycardia/pauses). Other recommendations (labs, testing, etc):  Lipid clinic Follow up as an outpatient:  Arranged for follow up in [facility]

## 2018-11-28 NOTE — Discharge Summary (Addendum)
Physician Discharge Summary       [address]       [place] [address]             [phone]    Patient ID: [name] MRN: [id number] DOB/AGE: January 06, 1941 78 y.o.  Admit date: 11/06/2018 Discharge date: 11/29/2018  Admission Diagnoses: 1. Non-ST elevation (NSTEMI) myocardial infarction ([place]) 2. Coronary artery disease  Discharge Diagnoses:  1. S/p CABG x 4 2.  ESRD on dialysis ([place]) 3. Pressure injury of skin 4. Anemia of chronic disease 5. History of HTN (hypertension) 6. History of Hyperlipidemia associated with type 2 diabetes mellitus ([place]) 7. History of Hypothyroidism 8. History of  Acute on chronic diastolic CHF (congestive heart failure) ([place]) 9. History of  Diabetes mellitus type 2, controlled, with complications [facility])  Consults: Cardiology and nephrology  Procedure (s):  DATE OF PROCEDURE: 11/20/2018  PREOPERATIVE DIAGNOSES: Three-vessel coronary artery disease.  POSTOPERATIVE DIAGNOSIS: Three-vessel coronary artery disease.  PROCEDURE: Median sternotomy, extracorporeal circulation coronary artery bypass grafting x4 (left internal mammary artery to left anterior descending, sequential saphenous vein graft to obtuse marginals 1 and 2, saphenous vein graft to posterior descending), endoscopic vein harvest, right leg on 11/20/2018.  SURGEON: Modesto Charon, MD  ASSISTANT: Elana Alm  ANESTHESIA: General.  FINDINGS: Transesophageal echocardiography showed mild mitral regurgitation, ejection fraction of approximately 50%. Good quality conduits. OM1 poor quality target. Remaining targets, good quality.  History of Presenting Illness: The patient is a 78 year old female with a history of progressive renal insufficiency, gout, non-STEMI, diabetes mellitus type 2, hypertension, hypothyroidism who presented to the emergency department with complaints of central chest pain starting in the morning at 4 AM on the date of  presentation. This occurred while she was trying to sleep. There is no associated radiation or shortness of breath. There is no nausea or vomiting. Due to the persistent nature of the chest pain she presented to the emergency department for further evaluation and treatment. She does note no aggravating or alleviating factors. Patient was noted to be anemic and does have a history of antiplatelet treatment. Peak troponin was noted to be 4.14and the patient was felt to require admission for further management and treatment to include cardiac catheterization.   Hospital Course: Cardiology consultation was obtained. Additionally due to her severe renal disease a nephrology consultation was also obtained. A complete cardiology evaluation with medical stabilization was done.Early BUN and creatinine were noted to be elevated at 103/7.1 respectively.The patient was begun on dialysis. Cardiac catheterization was performed on 11/12/2018 by Dr. Angelena Form. She was found to have severe multivessel coronary artery disease and recommendations were made for surgical revascularization. Cardiothoracic surgical consultation was obtained with StevenHendrickson, MD who evaluated the patient and studies and agreed with recommendations to proceed with high risk CABG. The patient remained medically stable and on 11/20/2018 she was taken to the operating room where she underwent the below described procedure. She tolerated it well and was taken to the surgical intensive care unit in stable condition.  Postoperative hospital course: As dictated by Jadene Pierini:  The patient has done well. Chest tubes were discontinued on postoperative day #1. Additionally at that time she was hemodynamically stable with good cardiac output and the Swan-Ganz catheter was discontinued. During the postoperative period dialysis has been managed by the nephrology service. She does have an expected acute blood loss anemia. She did  require a post op transfusion and was put on Aranesp.Blood sugars have been managed in a usual  fashion with Glucomander protocol and transition to home regimen.   Addendum: She had post op a fib but converted to sinus rhythm. She was put on oral Amiodarone and started on Lopressor. She developed bradycardia and had a pause. Lopressor was then stopped on 06/30. She was still requiring 2 liters of oxygen via [place]. She was desatting with ambulation;however, on 07/02 she was able to maintain her oxygenation. She will not need home oxygen.  Chest x ray done on 07/01 showed left pleural effusion, smaller right pleural effusion, and bibasilar atelectasis. IR was consulted for left thoracentesis on 07/02. Unfortunately, there was not enough pleural fluid to drain. Nephrology arranged for HD post operatively, which she last had on 07/03.  She did have nausea and vomiting on 06/29. She was given Reglan, prune juice and sorbitol for constipation. She was not put on a statin as she has intolerance of them in the past. We will ask cardiology if could consider PCSK9 as an outpatient after discharge. Epicardial pacing wires were removed on 06/30. She was felt surgically stable for transfer from the ICU to PCTU for further convalescence on 06/30. Her wounds are clean and dry. She has been tolerating a diet and has had a bowel movement. She had no further nausea or vomiting. She is deconditioned so PT consult placed to assist with ambulation and she would benefit from PT at discharge. As discussed with Dr. Roxan Hockey, she has had no further bradycardia or pauses. She will be started on Toprol XL 12.5 mg daily. Also, I did discuss with cardiology whether or not patient needs DAPT therapy (as had NSTEMI) and it was recommended to use baby ecasa only. Chest tube suture will be removed the day of discharge. She is felt surgically stable for discharge today.  The patient has been discharged on coumadin and cardiology set up an INR  check. If the patient does not hear from the coumadin clinic by Monday, she is to call and get an appointment.    Latest Vital Signs: Blood pressure 105/72, pulse 77, temperature 98.3 F (36.8 C), temperature source Oral, resp. rate (!) 21, height 5\' 4"  (1.626 m), weight 74.6 kg, SpO2 99 %.  Physical Exam: Cardiovascular: RRR Pulmonary: Slightly diminished left basilar breath sound Abdomen: Soft, non tender, bowel sounds present. Extremities:Trace bilateral lower extremity edema. Wounds: Clean and dry.  No erythema or signs of infection.  Discharge Condition: Stable and discharged to home.  Recent laboratory studies:  Lab Results  Component Value Date   WBC 6.9 11/29/2018   HGB 9.1 (L) 11/29/2018   HCT 29.6 (L) 11/29/2018   MCV 96.7 11/29/2018   PLT 331 11/29/2018   Lab Results  Component Value Date   NA 138 11/29/2018   K 3.6 11/29/2018   CL 100 11/29/2018   CO2 25 11/29/2018   CREATININE 5.33 (H) 11/29/2018   GLUCOSE 188 (H) 11/29/2018     Diagnostic Studies: Dg Chest 2 View  Result Date: 11/26/2018 CLINICAL DATA:  Pleural effusion EXAM: CHEST - 2 VIEW COMPARISON:  Two days ago FINDINGS: Small left pleural effusion that is stable. Small right pleural effusion in the lateral projection. Atelectatic type opacities at the bases. Further removal of hardware. No pneumothorax. Stable postoperative cardiopericardial enlargement. IMPRESSION: Atelectasis with small pleural effusions. Aeration is mildly improved from 2 days ago. Electronically Signed   By: Monte Fantasia M.D.   On: 11/26/2018 09:32   Ct Chest Wo Contrast  Result Date: 11/07/2018 CLINICAL DATA:  Evaluate chest  wall pain EXAM: CT CHEST WITHOUT CONTRAST TECHNIQUE: Multidetector CT imaging of the chest was performed following the standard protocol without IV contrast. COMPARISON:  CT chest 09/17/2016 FINDINGS: Cardiovascular: Cardiac enlargement. No pericardial effusion. Aortic atherosclerosis. Lad, left circumflex and  RCA coronary artery calcifications. Mediastinum/Nodes: Normal appearance of the thyroid gland. The trachea appears patent and is midline. Normal appearance of the esophagus. Prominent mediastinal lymph nodes measuring up to 1.2 cm within the right paratracheal chain noted. No adenopathy. The hilar structures are suboptimally evaluated due to lack of IV contrast. Lungs/Pleura: There are small bilateral pleural effusions. Interlobular septal thickening is identified bilaterally compatible with interstitial edema. Airspace consolidation and subsegmental atelectasis noted in both lower lobes, lingula and posterolateral left upper lobe. Upper Abdomen: No acute abnormality. Musculoskeletal: No chest wall mass or suspicious bone lesions identified. IMPRESSION: 1. Bilateral multifocal airspace consolidation and atelectasis. Findings consistent with multifocal pneumonia. 2. Cardiac enlargement, 3 vessel coronary artery calcifications, pulmonary edema, and bilateral pleural effusions compatible with CHF. 3.  Aortic Atherosclerosis (ICD10-I70.0). Electronically Signed   By: Kerby Moors M.D.   On: 11/07/2018 08:05   Nm Pulmonary Perfusion  Result Date: 11/06/2018 CLINICAL DATA:  Chest pain and shortness of breath for 1 day, elevated D-dimer, chronic kidney disease with elevated creatinine EXAM: NUCLEAR MEDICINE PERFUSION LUNG SCAN TECHNIQUE: Perfusion images were obtained in multiple projections after intravenous injection of radiopharmaceutical. Ventilation scans intentionally deferred if perfusion scan and chest x-ray adequate for interpretation during COVID 19 epidemic. RADIOPHARMACEUTICALS:  1.5 mCi Tc-22m MAA IV COMPARISON:  09/27/2018 Correlation: Chest radiograph 11/06/2018 FINDINGS: Normal perfusion images of both lungs. No segmental or subsegmental perfusion defects. Ventilation exam not performed. IMPRESSION: Normal perfusion lung scan, unchanged. Electronically Signed   By: Lavonia Dana M.D.   On: 11/06/2018  16:27    Ir US Chest  Result Date: 11/27/2018 CLINICAL DATA:  79 year old female referred for possible thoracentesis EXAM: CHEST ULTRASOUND COMPARISON:  Prior chest x-ray FINDINGS: Ultrasound performed of the left chest demonstrates scant fluid. IMPRESSION: Scant left-sided pleural effusion with no thoracentesis performed. Electronically Signed   By: Corrie Mckusick D.O.   On: 11/27/2018 17:28   Vas US Doppler Pre Cabg  Result Date: 11/18/2018 PREOPERATIVE VASCULAR EVALUATION  Risk Factors:     Hypertension, hyperlipidemia, Diabetes, coronary artery                   disease. Comparison Study: Prior study from 04/12/18 is available for comparison. No                   significant change noted. Performing Technologist: Sharion Dove RVS  Examination Guidelines: A complete evaluation includes B-mode imaging, spectral Doppler, color Doppler, and power Doppler as needed of all accessible portions of each vessel. Bilateral testing is considered an integral part of a complete examination. Limited examinations for reoccurring indications may be performed as noted.  Right Carotid Findings: +----------+--------+--------+--------+----------------------+--------+             PSV cm/s EDV cm/s Stenosis Describe               Comments  +----------+--------+--------+--------+----------------------+--------+  CCA Prox   81       19                                                 +----------+--------+--------+--------+----------------------+--------+  CCA  Distal 78       17                                                 +----------+--------+--------+--------+----------------------+--------+  ICA Prox   135      34                calcific and irregular           +----------+--------+--------+--------+----------------------+--------+  ICA Distal 133      23                                                 +----------+--------+--------+--------+----------------------+--------+  ECA        315      9                                                   +----------+--------+--------+--------+----------------------+--------+ Portions of this table do not appear on this page. +----------+--------+-------+--------+------------+             PSV cm/s EDV cms Describe Arm Pressure  +----------+--------+-------+--------+------------+  Subclavian 151                                     +----------+--------+-------+--------+------------+ +---------+--------+--+--------+-+  Vertebral PSV cm/s 37 EDV cm/s 7  +---------+--------+--+--------+-+ Left Carotid Findings: +----------+--------+--------+--------+------------+--------+             PSV cm/s EDV cm/s Stenosis Describe     Comments  +----------+--------+--------+--------+------------+--------+  CCA Prox   87       11                                       +----------+--------+--------+--------+------------+--------+  CCA Distal 113      15                                       +----------+--------+--------+--------+------------+--------+  ICA Prox   120      19                heterogenous           +----------+--------+--------+--------+------------+--------+  ICA Distal 112      20                                       +----------+--------+--------+--------+------------+--------+  ECA        246      10                                       +----------+--------+--------+--------+------------+--------+ +----------+--------+--------+--------+------------+  Subclavian PSV cm/s EDV cm/s Describe Arm Pressure  +----------+--------+--------+--------+------------+  81                         155           +----------+--------+--------+--------+------------+ +---------+--------+---+--------+--+  Vertebral PSV cm/s 106 EDV cm/s 13  +---------+--------+---+--------+--+  ABI Findings: +--------+------------------+-----+--------+-------------------+  Right    Rt Pressure (mmHg) Index Waveform Comment              +--------+------------------+-----+--------+-------------------+  Brachial                                    restricted/dialysis  +--------+------------------+-----+--------+-------------------+  PTA      163                1.05  biphasic                      +--------+------------------+-----+--------+-------------------+  DP       130                0.84  biphasic                      +--------+------------------+-----+--------+-------------------+ +--------+------------------+-----+---------+-------+  Left     Lt Pressure (mmHg) Index Waveform  Comment  +--------+------------------+-----+---------+-------+  Brachial 155                      triphasic          +--------+------------------+-----+---------+-------+  PTA      158                1.02  biphasic           +--------+------------------+-----+---------+-------+  DP       136                0.88  biphasic           +--------+------------------+-----+---------+-------+ +-------+---------------+----------------+  ABI/TBI Today's ABI/TBI Previous ABI/TBI  +-------+---------------+----------------+  Right   1.05                              +-------+---------------+----------------+  Left    1.02                              +-------+---------------+----------------+  Right Doppler Findings: +-----------+--------+-----+--------+------------------------------------------+  Site        Pressure Index Doppler  Comments                                    +-----------+--------+-----+--------+------------------------------------------+  Brachial                            restricted/dialysis                         +-----------+--------+-----+--------+------------------------------------------+  Radial                     biphasic                                             +-----------+--------+-----+--------+------------------------------------------+  Ulnar  biphasic                                             +-----------+--------+-----+--------+------------------------------------------+  Palmar Arch                         Doppler  signal reverses with radial                                              compression and remains normal with ulnar                                        compression                                 +-----------+--------+-----+--------+------------------------------------------+  Left Doppler Findings: +-----------+--------+-----+---------+--------+  Site        Pressure Index Doppler   Comments  +-----------+--------+-----+---------+--------+  Brachial    155            triphasic           +-----------+--------+-----+---------+--------+  Radial                     triphasic           +-----------+--------+-----+---------+--------+  Ulnar                      triphasic           +-----------+--------+-----+---------+--------+  Palmar Arch                          WNL       +-----------+--------+-----+---------+--------+  Summary: Right Carotid: Velocities in the right ICA are consistent with a 1-39% stenosis. Left Carotid: Velocities in the left ICA are consistent with a 1-39% stenosis. Vertebrals:  Bilateral vertebral arteries demonstrate antegrade flow. Subclavians: Normal flow hemodynamics were seen in bilateral subclavian              arteries. Right ABI: Resting right ankle-brachial index is within normal range. No evidence of significant right lower extremity arterial disease. Left ABI: Resting left ankle-brachial index is within normal range. No evidence of significant left lower extremity arterial disease. Right Upper Extremity: Doppler waveforms remain within normal limits with right radial compression. Doppler waveforms remain within normal limits with right ulnar compression. Left Upper Extremity: Doppler waveforms remain within normal limits with left radial compression. Doppler waveforms remain within normal limits with left ulnar compression.  Electronically signed by Deitra Mayo MD on 11/18/2018 at 11:45:20 AM.    Final        Discharge Instructions    Amb Referral to Cardiac  Rehabilitation   Complete by: As directed    [email or web address]   Diagnosis: CABG   CABG X ___: 4   After initial evaluation and assessments completed: Virtual Based Care may be provided alone or in conjunction with Phase 2 Cardiac Rehab based on patient barriers.: Yes      Discharge Medications: Allergies as of 11/29/2018  Reactions   Insulin Glargine Swelling   "Makes me swell like a balloon all over", including face, but without any respiratory distress or rashes. Associated with weight gain.   Statins Other (See Comments)   "I've tried them all; my muscle aches were so bad I couldn't walk".      Medication List    STOP taking these medications   clopidogrel 75 MG tablet Commonly known as: PLAVIX   furosemide 20 MG tablet Commonly known as: LASIX   metoprolol tartrate 25 MG tablet Commonly known as: LOPRESSOR   sulfamethoxazole-trimethoprim 800-160 MG tablet Commonly known as: BACTRIM DS     TAKE these medications   acetaminophen 325 MG tablet Commonly known as: TYLENOL Take 2 tablets (650 mg total) by mouth every 6 (six) hours as needed for mild pain or headache.   amiodarone 200 MG tablet Commonly known as: PACERONE Take 1 tablet (200 mg total) by mouth daily.   amLODipine 10 MG tablet Commonly known as: NORVASC Take 1 tablet (10 mg total) by mouth daily.   aspirin 81 MG EC tablet Take 1 tablet (81 mg total) by mouth daily.   B-D SINGLE USE SWABS REGULAR Pads   BD Pen Needle Micro U/F 32G X 6 MM Misc Generic drug: Insulin Pen Needle   docusate sodium 100 MG capsule Commonly known as: COLACE Take 1 capsule (100 mg total) by mouth daily.   gabapentin 300 MG capsule Commonly known as: NEURONTIN Take 2 capsules (600 mg total) by mouth at bedtime.   glucose blood test strip 1 each by Other route 4 (four) times daily. Use as instructed bid. E11.65   hydrALAZINE 25 MG tablet Commonly known as: APRESOLINE Take 1 tablet (25 mg total) by mouth 2 (two)  times daily. What changed: how much to take   metoprolol succinate 25 MG 24 hr tablet Commonly known as: Toprol XL Take 0.5 tablets (12.5 mg total) by mouth daily.   multivitamin with minerals Tabs tablet Take 1 tablet by mouth daily.   NP Thyroid 120 MG tablet Generic drug: thyroid Take 120 mg by mouth daily.   pantoprazole 40 MG tablet Commonly known as: PROTONIX Take 1 tablet (40 mg total) by mouth 2 (two) times daily. 30 minutes before breakfast What changed:   when to take this  additional instructions   PhosLo 667 MG capsule Generic drug: calcium acetate Take 667 mg by mouth 3 (three) times daily with meals.   sodium bicarbonate 650 MG tablet Take 650 mg by mouth 2 (two) times daily.   TOUJEO SOLOSTAR [place] Inject 5 Units into the skin at bedtime.   traMADol 50 MG tablet Commonly known as: Ultram Take 1 tablet (50 mg total) by mouth every 12 (twelve) hours as needed for moderate pain.   Uloric 40 MG tablet Generic drug: febuxostat Take 40 mg by mouth daily.   vitamin C 100 MG tablet Take 1 tablet (100 mg total) by mouth daily. What changed:   medication strength  how much to take   Vitamin D-3 125 MCG (5000 UT) Tabs Take 5,000 mg by mouth 3 (three) times a week.   warfarin 2.5 MG tablet Commonly known as: Coumadin Take 2 tablets (5 mg total) by mouth every Tuesday, Thursday, Saturday, and Sunday at 6 PM AND 1 tablet (2.5 mg total) every Monday, Wednesday, and Friday at 6 PM. Do all this for 21 days.            Durable Medical Equipment  (From  admission, onward)         Start     Ordered   11/27/18 1222  For home use only DME 3 n 1  Once     11/27/18 1221         The patient has been discharged on:   1.Beta Blocker:  Yes [ x  ]                              No   [   ]                              If No, reason:  2.Ace Inhibitor/ARB: Yes [   ]                                     No  [  x  ]                                     If No,  reason:ESRD 3.Statin:   Yes [   ]                  No  [ x  ]                  If No, reason:Allergy. Cardiology to decide if needs PCSK9  4.Ecasa:  Yes  [ x  ]                  No   [   ]                  If No, reason:  Follow Up Appointments: Follow-up Information    Melrose Nakayama, MD. Go on 12/23/2018.   Specialty: Cardiothoracic Surgery Why: PA/LAT CXR to be taken (at [facility] which is in the same building as Dr. Leonarda Salon office) on 07/28 at 2:15 pm;Appointment time is at 2:45 pm Contact information: [address] [place] [address] [place], [place], [place]. Go on 12/17/2018.   Specialties: Physician Assistant, Cardiology Why: Appointment time is at 3:30 pm Contact information: [address] [place] [address] [phone]        Pixie Casino, MD Follow up on 12/08/2018.   Specialty: Cardiology Why: at 9:20 AM  for cholesterol help.  to prevent further CAD Contact information: [place] [address] [phone]        [facility] Follow up.   Why: 3n1 arranged- to be delivered to room prior to discharge       [place], [place]. Call.   Why: for follow up appointment. They will arrange outpatient HD days Contact information: [place]  [address] [phone]        [facility] Follow up.   Specialty: [place] Why: HHRN/PT arranged- they will call you to set up home visits Contact information: [place] [address] [phone]        [facility]. Go on 12/02/2018.   Why: [facility]/[place] Building control surveyor confirmed that patient has a TTS schedule with a seat time of  11:30 as a new start to their clinic upon discharge. Joy stated that patient needs to arrive at 10:30am to complete paperwork        [facility] Follow up.   Specialty: Cardiology Why: If you do  not recieve a call by Monday 7/6. Please call and make an appointment for an INR check. Ideally should be 48 hours after discharge.  Contact information: [address] [place] [phone]          Signed: Terance Hart [facility], [place] 11/29/2018, 4:06 PM

## 2018-11-28 NOTE — Progress Notes (Signed)
      [address]       [place] [address]             [phone]       Patient's status assessed this afternoon post dialysis. The patient would prefer to work with PT this afternoon to work on stairs since she has 6 to get up to her house. We will plan to discharge [name] in the morning.   Nicholes Rough, PA-C

## 2018-11-28 NOTE — Progress Notes (Signed)
[facility] ROUNDING NOTE   Subjective:   This is a 78 year old lady that has a history of end-stage renal disease she is scheduled Tuesday Thursday Saturday dialysis at [facility].  She was a new start 11/07/2018.  Her last dialysis treatment was 11/26/2018 with removal of 1.8 L.  She underwent coronary artery bypass grafting 11/20/2018.  She has a history of congestive heart failure with an ejection fraction of 50 to 55% and inferior wall hypokinesis.  2D echo performed 08/2016.  Appears to be comfortable this morning still requiring oxygen appreciate assistance from Dr. Roxan Hockey.  Progression towards discharge with home health and possible home PT  Blood pressure 133/50 pulse 78 temperature 98.6 O2 sats 94% 2 L nasal cannula  Sodium 135 potassium 3.8 chloride 98 CO2 25 BUN 45 creatinine 5.4 glucose 101 calcium 8.6  Status post blood transfusion 1 unit 11/22/2018  Amlodipine 10 mg daily, amiodarone 200 mg daily aspirin 81 mg daily, PhosLo 667 mg 3 times daily with meals, darbepoetin 100 mcg q. Monday subcutaneous.  Uloric 40 mg daily gabapentin 600 mg nightly, insulin sliding scale, Levemir 5 units nightly,  Protonix 40 mg daily, thyroid 120 mg daily.  Hydralazine 25 mg twice daily,  CT scan of chest 11/07/2018 showed bilateral multifocal airspace consolidation cardiac enlargement with 3 vessel coronary artery calcifications pulmonary edema and bilateral pleural effusions.  Chest x-ray 11/26/2018 atrial ectasis with small pleural effusion aeration mildly improved.  Hemoglobin A1c 5.2 11/20/2018 PTH 114 10/29/2018   TSH 0.326 11/06/2018    Objective:  Vital signs in last 24 hours:  Temp:  [98.4 F (36.9 C)-98.9 F (37.2 C)] 98.6 F (37 C) (07/03 0348) Pulse Rate:  [76-81] 78 (07/03 0348) Resp:  [15-19] 17 (07/03 0348) BP: (133-149)/(47-52) 133/50 (07/03 0348) SpO2:  [94 %-99 %] 94 % (07/03 0348) Weight:  [77.4 kg] 77.4 kg (07/03 0348)  Weight change: -1.716 kg Filed Weights    11/26/18 1345 11/27/18 0426 11/28/18 0348  Weight: 76.9 kg 76.7 kg 77.4 kg    Intake/Output: I/O last 3 completed shifts: In: 400 [P.O.:400] Out: -    Intake/Output this shift:  No intake/output data recorded.  CVS- RRR midline sternotomy scar faint systolic murmur RS- CTA double-lumen catheter right IJ ABD- BS present soft non-distended EXT- no edema AV fistula right arm   Basic Metabolic Panel: Recent Labs  Lab 11/22/18 0350 11/23/18 0415 11/24/18 0527 11/25/18 0412 11/26/18 1059 11/28/18 0446  NA 136 138 137 135 136 135  K 4.8 3.9 4.1 4.0 3.9 3.8  CL 100 100 99 100 101 98  CO2 23 27 26 27 24 25   GLUCOSE 107* 101* 111* 100* 143* 101*  BUN 45* 31* 48* 26* 44* 45*  CREATININE 3.90* 3.37* 4.81* 3.32* 5.22* 5.45*  CALCIUM 8.1* 8.1* 8.2* 7.9* 8.5* 8.6*  MG 2.2 2.1 2.2 1.9 2.2  --   PHOS 6.7*  --   --   --  5.1*  --     Liver Function Tests: Recent Labs  Lab 11/26/18 1059  ALBUMIN 2.2*   No results for input(s): LIPASE, AMYLASE in the last 168 hours. No results for input(s): AMMONIA in the last 168 hours.  CBC: Recent Labs  Lab 11/22/18 0450 11/23/18 0415 11/24/18 0527 11/25/18 0412 11/26/18 1059  WBC 8.7 6.9 6.2 5.6 5.2  HGB 7.2* 8.0* 8.0* 7.9* 8.2*  HCT 23.4* 25.4* 26.5* 25.7* 26.9*  MCV 96.7 95.5 98.5 97.0 98.2  PLT 165 165 185 189 207  Cardiac Enzymes: No results for input(s): CKTOTAL, CKMB, CKMBINDEX, TROPONINI in the last 168 hours.  BNP: Invalid input(s): POCBNP  CBG: Recent Labs  Lab 11/27/18 0632 11/27/18 1058 11/27/18 1602 11/27/18 2111 11/28/18 0602  GLUCAP 100* 138* 120* 150* 102*    Microbiology: Results for orders placed or performed during the hospital encounter of 11/06/18  SARS Coronavirus 2 (CEPHEID - Performed in [place] hospital lab), Hosp Order     Status: None   Collection Time: 11/06/18  1:30 PM   Specimen: Nasopharyngeal Swab  Result Value Ref Range Status   SARS Coronavirus 2 NEGATIVE NEGATIVE Final     Comment: (NOTE) If result is NEGATIVE SARS-CoV-2 target nucleic acids are NOT DETECTED. The SARS-CoV-2 RNA is generally detectable in upper and lower  respiratory specimens during the acute phase of infection. The lowest  concentration of SARS-CoV-2 viral copies this assay can detect is 250  copies / mL. A negative result does not preclude SARS-CoV-2 infection  and should not be used as the sole basis for treatment or other  patient management decisions.  A negative result may occur with  improper specimen collection / handling, submission of specimen other  than nasopharyngeal swab, presence of viral mutation(s) within the  areas targeted by this assay, and inadequate number of viral copies  (<250 copies / mL). A negative result must be combined with clinical  observations, patient history, and epidemiological information. If result is POSITIVE SARS-CoV-2 target nucleic acids are DETECTED. The SARS-CoV-2 RNA is generally detectable in upper and lower  respiratory specimens dur ing the acute phase of infection.  Positive  results are indicative of active infection with SARS-CoV-2.  Clinical  correlation with patient history and other diagnostic information is  necessary to determine patient infection status.  Positive results do  not rule out bacterial infection or co-infection with other viruses. If result is PRESUMPTIVE POSTIVE SARS-CoV-2 nucleic acids MAY BE PRESENT.   A presumptive positive result was obtained on the submitted specimen  and confirmed on repeat testing.  While 2019 novel coronavirus  (SARS-CoV-2) nucleic acids may be present in the submitted sample  additional confirmatory testing may be necessary for epidemiological  and / or clinical management purposes  to differentiate between  SARS-CoV-2 and other Sarbecovirus currently known to infect humans.  If clinically indicated additional testing with an alternate test  methodology [phone]) is advised. The SARS-CoV-2  RNA is generally  detectable in upper and lower respiratory sp ecimens during the acute  phase of infection. The expected result is Negative. Fact Sheet for Patients:  StrictlyIdeas.no Fact Sheet for Healthcare Providers: BankingDealers.co.za This test is not yet approved or cleared by the [place] FDA and has been authorized for detection and/or diagnosis of SARS-CoV-2 by FDA under an Emergency Use Authorization (EUA).  This EUA will remain in effect (meaning this test can be used) for the duration of the COVID-19 declaration under Section 564(b)(1) of the Act, 21 U.S.C. section 360bbb-3(b)(1), unless the authorization is terminated or revoked sooner. Performed at [facility], [address], [place], [place] [address]   MRSA PCR Screening     Status: None   Collection Time: 11/07/18  6:09 AM   Specimen: Nasal Mucosa; Nasopharyngeal  Result Value Ref Range Status   MRSA by PCR NEGATIVE NEGATIVE Final    Comment:        The GeneXpert MRSA Assay (FDA approved for NASAL specimens only), is one component of a comprehensive MRSA colonization surveillance program. It  is not intended to diagnose MRSA infection nor to guide or monitor treatment for MRSA infections. Performed at [facility], [address], [place], [place] [address]   Culture, blood (routine x 2)     Status: None   Collection Time: 11/07/18  7:38 AM   Specimen: Right Antecubital; Blood  Result Value Ref Range Status   Specimen Description   Final    RIGHT ANTECUBITAL BOTTLES DRAWN AEROBIC AND ANAEROBIC   Special Requests Blood Culture adequate volume  Final   Culture   Final    NO GROWTH 5 DAYS Performed at [facility], [address], [place], [place] [address]    Report Status 11/12/2018 FINAL  Final  Culture, blood (routine x 2)     Status: None   Collection Time: 11/07/18  7:38 AM   Specimen: BLOOD LEFT FOREARM  Result Value Ref Range Status   Specimen  Description   Final    BLOOD LEFT FOREARM BOTTLES DRAWN AEROBIC AND ANAEROBIC   Special Requests Blood Culture adequate volume  Final   Culture   Final    NO GROWTH 5 DAYS Performed at [facility], [address] [place], [place] [address]    Report Status 11/12/2018 FINAL  Final  Surgical PCR screen     Status: None   Collection Time: 11/19/18  8:40 PM   Specimen: Nasal Mucosa; Nasal Swab  Result Value Ref Range Status   MRSA, PCR NEGATIVE NEGATIVE Final   Staphylococcus aureus NEGATIVE NEGATIVE Final    Comment: (NOTE) The Xpert SA Assay (FDA approved for NASAL specimens in patients 39 years of age and older), is one component of a comprehensive surveillance program. It is not intended to diagnose infection nor to guide or monitor treatment. Performed at [facility], [place] [address], [place], [place] [address]   SARS Coronavirus 2 (CEPHEID - Performed in [place] hospital lab), Hosp Order     Status: None   Collection Time: 11/27/18  1:57 PM   Specimen: Nasopharyngeal Swab  Result Value Ref Range Status   SARS Coronavirus 2 NEGATIVE NEGATIVE Final    Comment: (NOTE) If result is NEGATIVE SARS-CoV-2 target nucleic acids are NOT DETECTED. The SARS-CoV-2 RNA is generally detectable in upper and lower  respiratory specimens during the acute phase of infection. The lowest  concentration of SARS-CoV-2 viral copies this assay can detect is 250  copies / mL. A negative result does not preclude SARS-CoV-2 infection  and should not be used as the sole basis for treatment or other  patient management decisions.  A negative result may occur with  improper specimen collection / handling, submission of specimen other  than nasopharyngeal swab, presence of viral mutation(s) within the  areas targeted by this assay, and inadequate number of viral copies  (<250 copies / mL). A negative result must be combined with clinical  observations, patient history, and epidemiological  information. If result is POSITIVE SARS-CoV-2 target nucleic acids are DETECTED. The SARS-CoV-2 RNA is generally detectable in upper and lower  respiratory specimens dur ing the acute phase of infection.  Positive  results are indicative of active infection with SARS-CoV-2.  Clinical  correlation with patient history and other diagnostic information is  necessary to determine patient infection status.  Positive results do  not rule out bacterial infection or co-infection with other viruses. If result is PRESUMPTIVE POSTIVE SARS-CoV-2 nucleic acids MAY BE PRESENT.   A presumptive positive result was obtained on the submitted specimen  and confirmed on repeat testing.  While 2019 novel coronavirus  (SARS-CoV-2) nucleic acids may be present in the submitted sample  additional confirmatory testing may be necessary for epidemiological  and / or clinical management purposes  to differentiate between  SARS-CoV-2 and other Sarbecovirus currently known to infect humans.  If clinically indicated additional testing with an alternate test  methodology [phone]) is advised. The SARS-CoV-2 RNA is generally  detectable in upper and lower respiratory sp ecimens during the acute  phase of infection. The expected result is Negative. Fact Sheet for Patients:  StrictlyIdeas.no Fact Sheet for Healthcare Providers: BankingDealers.co.za This test is not yet approved or cleared by the [place] FDA and has been authorized for detection and/or diagnosis of SARS-CoV-2 by FDA under an Emergency Use Authorization (EUA).  This EUA will remain in effect (meaning this test can be used) for the duration of the COVID-19 declaration under Section 564(b)(1) of the Act, 21 U.S.C. section 360bbb-3(b)(1), unless the authorization is terminated or revoked sooner. Performed at [facility], [place] [address], [place], [place] [address]     Coagulation Studies: No  results for input(s): LABPROT, INR in the last 72 hours.  Urinalysis: No results for input(s): COLORURINE, LABSPEC, PHURINE, GLUCOSEU, HGBUR, BILIRUBINUR, KETONESUR, PROTEINUR, UROBILINOGEN, NITRITE, LEUKOCYTESUR in the last 72 hours.  Invalid input(s): APPERANCEUR    Imaging: Dg Chest 2 View  Result Date: 11/26/2018 CLINICAL DATA:  Pleural effusion EXAM: CHEST - 2 VIEW COMPARISON:  Two days ago FINDINGS: Small left pleural effusion that is stable. Small right pleural effusion in the lateral projection. Atelectatic type opacities at the bases. Further removal of hardware. No pneumothorax. Stable postoperative cardiopericardial enlargement. IMPRESSION: Atelectasis with small pleural effusions. Aeration is mildly improved from 2 days ago. Electronically Signed   By: Monte Fantasia M.D.   On: 11/26/2018 09:32   Ir US Chest  Result Date: 11/27/2018 CLINICAL DATA:  78 year old female referred for possible thoracentesis EXAM: CHEST ULTRASOUND COMPARISON:  Prior chest x-ray FINDINGS: Ultrasound performed of the left chest demonstrates scant fluid. IMPRESSION: Scant left-sided pleural effusion with no thoracentesis performed. Electronically Signed   By: Corrie Mckusick D.O.   On: 11/27/2018 17:28     Medications:   . sodium chloride    . lactated ringers     . amiodarone  200 mg Oral Daily  . amLODipine  10 mg Oral Daily  . aspirin EC  81 mg Oral Daily  . bisacodyl  10 mg Oral Daily   Or  . bisacodyl  10 mg Rectal Daily  . calcium acetate  667 mg Oral TID WC  . Chlorhexidine Gluconate Cloth  6 each Topical Q0600  . Chlorhexidine Gluconate Cloth  6 each Topical Q0600  . ciprofloxacin-dexamethasone  4 drop Left EAR BID  . darbepoetin (ARANESP) injection - NON-DIALYSIS  100 mcg Subcutaneous Q Mon-1800  . docusate sodium  200 mg Oral Daily  . febuxostat  40 mg Oral Daily  . gabapentin  600 mg Oral QHS  . hydrALAZINE  25 mg Oral BID  . insulin aspart  0-5 Units Subcutaneous QHS  . insulin  aspart  0-9 Units Subcutaneous TID WC  . insulin detemir  5 Units Subcutaneous QHS  . pantoprazole  40 mg Oral Daily  . sodium chloride flush  10-40 mL Intracatheter Q12H  . sodium chloride flush  3 mL Intravenous Q12H  . thyroid  120 mg Oral Q0600   acetaminophen, metoprolol tartrate, ondansetron (ZOFRAN) IV, phenol, sodium chloride  flush, sodium chloride flush  Assessment/ Plan:   End-stage renal disease Tuesday Thursday dialysis clipped to [facility] unit.  Will continue on schedule TTS.   Marland Kitchen  She is off schedule due to high dialysis volumes and did not receive dialysis 11/25/2018.  She is clipped to [facility] unit TTS.  Dialysis planned 11/28/2018   Anemia hemoglobin 8.2 low iron saturations 11/22/2018 with a transfusion 1 pack blood cells 11/22/2018.  We will continue to follow she is receiving darbepoetin  Bones PTH 114 calcium phosphorus continue to follow phosphorus slightly elevated continues on binders.    Nutrition pro-stat low albumin 2.2  Hypertension controlled added hydralazine 25 mg twice daily  Diabetes as per primary team glucose appears controlled at this point  Status post CABG appreciate input and help from cardiovascular team  Coronary artery disease continues aspirin no statin due to intolerance  Hypothyroidism on replacement therapy  Diastolic heart failure continues on amiodarone.  Hyperlipidemia intolerance of statins defer to primary team  Disposition not quite ready for discharge    LOS: [place] @TODAY @8 :21 AM

## 2018-11-28 NOTE — Progress Notes (Signed)
Pt and her caregiver are expressing concerns about pt's ability to get into her home on DC.  There are 6 steps to get into the home.  Pt and caregiver are also expressing concern about pt's ability to get to follow up appointments after DC.  Caregiver is not able to lift patient.

## 2018-11-29 DIAGNOSIS — I480 Paroxysmal atrial fibrillation: Secondary | ICD-10-CM

## 2018-11-29 LAB — RENAL FUNCTION PANEL
Albumin: 2.5 g/dL — ABNORMAL LOW (ref 3.5–5.0)
Anion gap: 13 (ref 5–15)
BUN: 38 mg/dL — ABNORMAL HIGH (ref 8–23)
CO2: 25 mmol/L (ref 22–32)
Calcium: 8.8 mg/dL — ABNORMAL LOW (ref 8.9–10.3)
Chloride: 100 mmol/L (ref 98–111)
Creatinine, Ser: 5.33 mg/dL — ABNORMAL HIGH (ref 0.44–1.00)
GFR calc Af Amer: 8 mL/min — ABNORMAL LOW (ref >60)
GFR calc non Af Amer: 7 mL/min — ABNORMAL LOW (ref >60)
Glucose, Bld: 188 mg/dL — ABNORMAL HIGH (ref 70–99)
Phosphorus: 3.3 mg/dL (ref 2.5–4.6)
Potassium: 3.6 mmol/L (ref 3.5–5.1)
Sodium: 138 mmol/L (ref 135–145)

## 2018-11-29 LAB — CBC
HCT: 29.6 % — ABNORMAL LOW (ref 36.0–46.0)
Hemoglobin: 9.1 g/dL — ABNORMAL LOW (ref 12.0–15.0)
MCH: 29.7 pg (ref 26.0–34.0)
MCHC: 30.7 g/dL (ref 30.0–36.0)
MCV: 96.7 fL (ref 80.0–100.0)
Platelets: 331 10*3/uL (ref 150–400)
RBC: 3.06 MIL/uL — ABNORMAL LOW (ref 3.87–5.11)
RDW: 14.8 % (ref 11.5–15.5)
WBC: 6.9 10*3/uL (ref 4.0–10.5)
nRBC: 0 % (ref 0.0–0.2)

## 2018-11-29 LAB — GLUCOSE, CAPILLARY
Glucose-Capillary: 113 mg/dL — ABNORMAL HIGH (ref 70–99)
Glucose-Capillary: 193 mg/dL — ABNORMAL HIGH (ref 70–99)
Glucose-Capillary: 111 mg/dL — ABNORMAL HIGH (ref 70–99)
Glucose-Capillary: 157 mg/dL — ABNORMAL HIGH (ref 70–99)
Glucose-Capillary: 150 mg/dL — ABNORMAL HIGH (ref 70–99)

## 2018-11-29 MED ORDER — PENTAFLUOROPROP-TETRAFLUOROETH EX AERO: 1.0000 "application " | INHALATION_SPRAY | CUTANEOUS | Status: DC | PRN

## 2018-11-29 MED ORDER — SODIUM CHLORIDE 0.9 % IV SOLN: 100.0000 mL | INTRAVENOUS | Status: DC | PRN

## 2018-11-29 MED ORDER — ALTEPLASE 2 MG IJ SOLR: 2.0000 mg | Freq: Once | INTRAMUSCULAR | Status: DC | PRN

## 2018-11-29 MED ORDER — CHLORHEXIDINE GLUCONATE CLOTH 2 % EX PADS
6.0000 | MEDICATED_PAD | Freq: Every day | CUTANEOUS | Status: DC
  Administered 2018-11-30: 6 via TOPICAL

## 2018-11-29 MED ORDER — WARFARIN SODIUM 5 MG PO TABS
5.0000 mg | ORAL_TABLET | Freq: Once | ORAL | Status: AC
  Administered 2018-11-29: 5 mg via ORAL
  Filled 2018-11-29: qty 1

## 2018-11-29 MED ORDER — LIDOCAINE-PRILOCAINE 2.5-2.5 % EX CREA
1.0000 "application " | TOPICAL_CREAM | CUTANEOUS | Status: DC | PRN
  Filled 2018-11-29: qty 5

## 2018-11-29 MED ORDER — HEPARIN SODIUM (PORCINE) 1000 UNIT/ML DIALYSIS: 1000.0000 [IU] | INTRAMUSCULAR | Status: DC | PRN

## 2018-11-29 MED ORDER — LIDOCAINE HCL (PF) 1 % IJ SOLN: 5.0000 mL | INTRAMUSCULAR | Status: DC | PRN

## 2018-11-29 MED ORDER — LIDOCAINE-PRILOCAINE 2.5-2.5 % EX CREA: 1.0000 "application " | TOPICAL_CREAM | CUTANEOUS | Status: DC | PRN

## 2018-11-29 MED ORDER — WARFARIN - PHYSICIAN DOSING INPATIENT: Freq: Every day | Status: DC

## 2018-11-29 MED ORDER — WARFARIN SODIUM 5 MG PO TABS: ORAL_TABLET | ORAL | 0 refills | Status: DC

## 2018-11-29 MED ORDER — WARFARIN SODIUM 2.5 MG PO TABS: ORAL_TABLET | ORAL | 0 refills | Status: DC

## 2018-11-29 NOTE — Evaluation (Signed)
Physical Therapy Evaluation Patient Details Name: [name] MRN: [id number] DOB: [birth year] Today's Date: 11/29/2018   History of Present Illness  78 year old WF PMHx  DM 2 controlled with complication, HTN, CKD stage 5, CAD s/p STEMI treated medically due to advanced CKD and anemia, gout, chronic combined systolic and diastolic CHF, Hypothyroid, Admitted 11/06/2018 with diagnosis of NSTEMI, acute on chronic diastolic CHF, progressive CKD to ESRD. Nephrology consulted and HD initiated this admission. Cardiology consulted, cardiac cath 6/17 showed severe three-vessel disease, TCTS consulted and plan CABG after Plavix washout (last dose 11/12/2018). Patient for CABG on 11/20/2018. Pt with episode of Afib 11/28/2018.   Clinical Impression  PTA pt lives with room mate in single story home with 6 steps to enter and was independent in mobility and iADLs. Pt is limited in safe mobility by dizziness with movement after dialysis, as well as decreased strength and endurance. Vestibular screen indicated possible R beat nystagmus and dizziness with fixed gaze and R head turns. Pt requires min A for transfers and ambulation of 10 feet with RW. Pt requires modAx2 for stair training of 5 steps during which she became diaphoretic and pale, with period of unresponsiveness requiring maxAx2 for return to bed. With supine pt able to answer questions and color returned to her face. Given pt PLOF and 24/hr support PT recommending CIR level rehab at discharge for strengthening and mobility training. PT will continue to follow acutely.    Follow Up Recommendations CIR    Equipment Recommendations  Other (comment)(TBD at next venue)    Recommendations for Other Services OT consult;Other (comment)(Vestibular Exam)     Precautions / Restrictions Precautions Precautions: Sternal Precaution Booklet Issued: No Restrictions Weight Bearing Restrictions: Yes(sternal precautions)      Mobility  Bed Mobility                General bed mobility comments: sitting EoB on entry  Transfers Overall transfer level: Needs assistance Equipment used: 1 person hand held assist;2 person hand held assist Transfers: Sit to/from Stand Sit to Stand: Min assist         General transfer comment: minA for power up and steadying, taught pt technique of pushing on knees to assist with power up, pt with increased fear of falling with anterior lean for powerup  Ambulation/Gait Ambulation/Gait assistance: Min assist Gait Distance (Feet): 10 Feet Assistive device: Rolling walker (2 wheeled) Gait Pattern/deviations: Step-through pattern;Decreased step length - right;Decreased step length - left Gait velocity: slowed Gait velocity interpretation: <1.31 ft/sec, indicative of household ambulator General Gait Details: minA for steadying with RW,   Stairs Stairs: Yes Stairs assistance: Mod assist;+2 physical assistance Stair Management: One rail Left;Sideways;Step to pattern Number of Stairs: 5 General stair comments: modAx2 for powering up during ascent and controlling descent of 5 steps.        Balance Overall balance assessment: Needs assistance Sitting-balance support: Feet supported;No upper extremity supported Sitting balance-Leahy Scale: Fair     Standing balance support: No upper extremity supported;Bilateral upper extremity supported;During functional activity Standing balance-Leahy Scale: Fair                               Pertinent Vitals/Pain Pain Assessment: Faces Faces Pain Scale: Hurts a little bit Pain Location: earache Pain Descriptors / Indicators: Throbbing Pain Intervention(s): Limited activity within patient's tolerance;Monitored during session;Repositioned    Home Living Family/patient expects to be discharged to:: Private residence Living Arrangements: Spouse/significant other  Available Help at Discharge: Friend(s);Available 24 hours/day Type of Home: House Home  Access: Stairs to enter Entrance Stairs-Rails: Psychiatric nurse of Steps: 6 Home Layout: One level Home Equipment: Walker - 2 wheels;Tub bench;Grab bars - tub/shower;Hand held shower head      Prior Function Level of Independence: Independent                  Extremity/Trunk Assessment   Upper Extremity Assessment Upper Extremity Assessment: Generalized weakness    Lower Extremity Assessment Lower Extremity Assessment: Generalized weakness       Communication   Communication: No difficulties  Cognition Arousal/Alertness: Awake/alert Behavior During Therapy: WFL for tasks assessed/performed Overall Cognitive Status: Within Functional Limits for tasks assessed                                        General Comments General comments (skin integrity, edema, etc.): Pt with c/o of earache, with echoing effect and dizziness. Vestibular screen indicated R beat nystagmus, and increased dizziness with fixed gaze and head turn to R. Pt with history of ear infection during hospitalization. Pt became pale and diaporetic with stair training with pt stating "I'm going out." prior to 30 seconds of unresponsiveness. pt required maxAx2 for return to bed where RN took BP. Pt noted to have slight nystagmus prior to responding to her name. Once in supine color returned to her face and she was again able to respond to questions.         Assessment/Plan    PT Assessment Patient needs continued PT services  PT Problem List Decreased strength;Decreased activity tolerance;Decreased mobility;Cardiopulmonary status limiting activity       PT Treatment Interventions DME instruction;Gait training;Stair training;Functional mobility training;Therapeutic activities;Therapeutic exercise;Balance training;Cognitive remediation;Patient/family education    PT Goals (Current goals can be found in the Care Plan section)  Acute Rehab PT Goals Patient Stated Goal: go  home PT Goal Formulation: With patient/family Time For Goal Achievement: 12/13/18 Potential to Achieve Goals: Good    Frequency Min 3X/week   Barriers to discharge Inaccessible home environment         AM-PAC PT "6 Clicks" Mobility  Outcome Measure Help needed turning from your back to your side while in a flat bed without using bedrails?: None Help needed moving from lying on your back to sitting on the side of a flat bed without using bedrails?: A Little Help needed moving to and from a bed to a chair (including a wheelchair)?: A Little Help needed standing up from a chair using your arms (e.g., wheelchair or bedside chair)?: A Little Help needed to walk in hospital room?: A Little Help needed climbing 3-5 steps with a railing? : A Lot 6 Click Score: 18    End of Session Equipment Utilized During Treatment: Gait belt Activity Tolerance: Treatment limited secondary to medical complications (Comment)(diaphoresis and pallor with climbing stairs) Patient left: in bed;with call bell/phone within reach;with nursing/sitter in room Nurse Communication: Mobility status;Other (comment)(decreased safety with stair training recommend discharge pos) PT Visit Diagnosis: Unsteadiness on feet (R26.81);Other abnormalities of gait and mobility (R26.89);Muscle weakness (generalized) (M62.81);Difficulty in walking, not elsewhere classified (R26.2);Dizziness and giddiness (R42)    Time: 1443-1540 PT Time Calculation (min) (ACUTE ONLY): 44 min   Charges:   PT Evaluation $PT Eval Moderate Complexity: 1 Mod PT Treatments $Gait Training: 23-37 mins        Benjamine Mola  Wende Mott PT, DPT Acute Rehabilitation Services Pager [phone] Office [phone]   [place] 11/29/2018, 5:34 PM

## 2018-11-29 NOTE — Progress Notes (Signed)
PT Cancellation Note  Patient Details Name: [name] MRN: [id number] DOB: 1941/02/18   Cancelled Treatment:    Reason Eval/Treat Not Completed: (P) Patient at procedure or test/unavailable Pt in HD. PT will follow back this afternoon as able.  Stacyann Mcconaughy B. Migdalia Dk PT, DPT Acute Rehabilitation Services Pager [phone] Office [phone]    [place] 11/29/2018, 11:12 AM

## 2018-11-29 NOTE — Significant Event (Signed)
Pt attempted to ambulate in room and go up steps with PT.  Pt immediately became dizzy and diaphoretic.  Heart rate became rapid and rhythm reported by CCMD to be flutter.  Pt became pale and minimally responsive  Pt had to be placed back in bed by pt and nurses.  BP 114/47. Pt reports feeling better after resting in bed.  Will continue to monitor.

## 2018-11-29 NOTE — Progress Notes (Signed)
[facility] ROUNDING NOTE   Subjective:   This is a 78 year old lady that has a history of end-stage renal disease she is scheduled Tuesday Thursday Saturday dialysis at [facility].  She was a new start 11/07/2018.  Her last dialysis treatment was 11/28/2018 with removal of 3 L.  She underwent coronary artery bypass grafting 11/20/2018.  She has a history of congestive heart failure with an ejection fraction of 50 to 55% and inferior wall hypokinesis.  2D echo performed 08/2016.  Appears to be comfortable this morning still requiring oxygen appreciate assistance from Dr. Roxan Hockey.  Progression towards discharge with home health and possible home PT.  Blood pressure 128/49 pulse 74 temperature 98.4 O2 sats 91% room air  Sodium 135 potassium 3.8 chloride 98 CO2 25 BUN 45 creatinine 5.4 glucose 101 calcium 8.6  Status post blood transfusion 1 unit 11/22/2018  Amlodipine 10 mg daily, amiodarone 200 mg daily aspirin 81 mg daily, PhosLo 667 mg 3 times daily with meals, darbepoetin 100 mcg q. Monday subcutaneous.  Uloric 40 mg daily gabapentin 600 mg nightly, insulin sliding scale, Levemir 5 units nightly,  Protonix 40 mg daily, thyroid 120 mg daily.  Hydralazine 25 mg twice daily,  CT scan of chest 11/07/2018 showed bilateral multifocal airspace consolidation cardiac enlargement with 3 vessel coronary artery calcifications pulmonary edema and bilateral pleural effusions.  Chest x-ray 11/26/2018 atrial ectasis with small pleural effusion aeration mildly improved.  Hemoglobin A1c 5.2 11/20/2018 PTH 114 10/29/2018   TSH 0.326 11/06/2018    Objective:  Vital signs in last 24 hours:  Temp:  [98.3 F (36.8 C)-98.8 F (37.1 C)] 98.5 F (36.9 C) (07/04 0618) Pulse Rate:  [72-128] 73 (07/04 0618) Resp:  [13-20] 13 (07/04 0618) BP: (107-141)/(38-75) 128/49 (07/04 0618) SpO2:  [91 %-98 %] 91 % (07/04 0618) Weight:  [74.3 kg-75.4 kg] 75.4 kg (07/04 0618)  Weight change: -0.384 kg Filed  Weights   11/28/18 0759 11/28/18 1114 11/29/18 0618  Weight: 77 kg 74.3 kg 75.4 kg    Intake/Output: I/O last 3 completed shifts: In: -  Out: 3000 [Other:3000]   Intake/Output this shift:  No intake/output data recorded.  CVS- RRR midline sternotomy scar faint systolic murmur RS- CTA double-lumen catheter right IJ ABD- BS present soft non-distended EXT- no edema AV fistula right arm   Basic Metabolic Panel: Recent Labs  Lab 11/23/18 0415 11/24/18 0527 11/25/18 0412 11/26/18 1059 11/28/18 0446  NA 138 137 135 136 135  K 3.9 4.1 4.0 3.9 3.8  CL 100 99 100 101 98  CO2 27 26 27 24 25   GLUCOSE 101* 111* 100* 143* 101*  BUN 31* 48* 26* 44* 45*  CREATININE 3.37* 4.81* 3.32* 5.22* 5.45*  CALCIUM 8.1* 8.2* 7.9* 8.5* 8.6*  MG 2.1 2.2 1.9 2.2  --   PHOS  --   --   --  5.1*  --     Liver Function Tests: Recent Labs  Lab 11/26/18 1059  ALBUMIN 2.2*   No results for input(s): LIPASE, AMYLASE in the last 168 hours. No results for input(s): AMMONIA in the last 168 hours.  CBC: Recent Labs  Lab 11/23/18 0415 11/24/18 0527 11/25/18 0412 11/26/18 1059  WBC 6.9 6.2 5.6 5.2  HGB 8.0* 8.0* 7.9* 8.2*  HCT 25.4* 26.5* 25.7* 26.9*  MCV 95.5 98.5 97.0 98.2  PLT 165 185 189 207    Cardiac Enzymes: No results for input(s): CKTOTAL, CKMB, CKMBINDEX, TROPONINI in the last 168 hours.  BNP: Invalid  input(s): POCBNP  CBG: Recent Labs  Lab 11/28/18 1325 11/28/18 1452 11/28/18 1638 11/28/18 2117 11/29/18 0627  GLUCAP 181* 185* 118* 141* 113*    Microbiology: Results for orders placed or performed during the hospital encounter of 11/06/18  SARS Coronavirus 2 (CEPHEID - Performed in [place] hospital lab), Hosp Order     Status: None   Collection Time: 11/06/18  1:30 PM   Specimen: Nasopharyngeal Swab  Result Value Ref Range Status   SARS Coronavirus 2 NEGATIVE NEGATIVE Final    Comment: (NOTE) If result is NEGATIVE SARS-CoV-2 target nucleic acids are NOT  DETECTED. The SARS-CoV-2 RNA is generally detectable in upper and lower  respiratory specimens during the acute phase of infection. The lowest  concentration of SARS-CoV-2 viral copies this assay can detect is 250  copies / mL. A negative result does not preclude SARS-CoV-2 infection  and should not be used as the sole basis for treatment or other  patient management decisions.  A negative result may occur with  improper specimen collection / handling, submission of specimen other  than nasopharyngeal swab, presence of viral mutation(s) within the  areas targeted by this assay, and inadequate number of viral copies  (<250 copies / mL). A negative result must be combined with clinical  observations, patient history, and epidemiological information. If result is POSITIVE SARS-CoV-2 target nucleic acids are DETECTED. The SARS-CoV-2 RNA is generally detectable in upper and lower  respiratory specimens dur ing the acute phase of infection.  Positive  results are indicative of active infection with SARS-CoV-2.  Clinical  correlation with patient history and other diagnostic information is  necessary to determine patient infection status.  Positive results do  not rule out bacterial infection or co-infection with other viruses. If result is PRESUMPTIVE POSTIVE SARS-CoV-2 nucleic acids MAY BE PRESENT.   A presumptive positive result was obtained on the submitted specimen  and confirmed on repeat testing.  While 2019 novel coronavirus  (SARS-CoV-2) nucleic acids may be present in the submitted sample  additional confirmatory testing may be necessary for epidemiological  and / or clinical management purposes  to differentiate between  SARS-CoV-2 and other Sarbecovirus currently known to infect humans.  If clinically indicated additional testing with an alternate test  methodology [phone]) is advised. The SARS-CoV-2 RNA is generally  detectable in upper and lower respiratory sp ecimens during  the acute  phase of infection. The expected result is Negative. Fact Sheet for Patients:  StrictlyIdeas.no Fact Sheet for Healthcare Providers: BankingDealers.co.za This test is not yet approved or cleared by the [place] FDA and has been authorized for detection and/or diagnosis of SARS-CoV-2 by FDA under an Emergency Use Authorization (EUA).  This EUA will remain in effect (meaning this test can be used) for the duration of the COVID-19 declaration under Section 564(b)(1) of the Act, 21 U.S.C. section 360bbb-3(b)(1), unless the authorization is terminated or revoked sooner. Performed at [facility], [address], [place], [place] [address]   MRSA PCR Screening     Status: None   Collection Time: 11/07/18  6:09 AM   Specimen: Nasal Mucosa; Nasopharyngeal  Result Value Ref Range Status   MRSA by PCR NEGATIVE NEGATIVE Final    Comment:        The GeneXpert MRSA Assay (FDA approved for NASAL specimens only), is one component of a comprehensive MRSA colonization surveillance program. It is not intended to diagnose MRSA infection nor to guide or monitor treatment for MRSA infections. Performed at  [facility], [address], [place], [place] [address]   Culture, blood (routine x 2)     Status: None   Collection Time: 11/07/18  7:38 AM   Specimen: Right Antecubital; Blood  Result Value Ref Range Status   Specimen Description   Final    RIGHT ANTECUBITAL BOTTLES DRAWN AEROBIC AND ANAEROBIC   Special Requests Blood Culture adequate volume  Final   Culture   Final    NO GROWTH 5 DAYS Performed at [facility], [address] [place], [place] [address]    Report Status 11/12/2018 FINAL  Final  Culture, blood (routine x 2)     Status: None   Collection Time: 11/07/18  7:38 AM   Specimen: BLOOD LEFT FOREARM  Result Value Ref Range Status   Specimen Description   Final    BLOOD LEFT FOREARM BOTTLES DRAWN AEROBIC AND ANAEROBIC    Special Requests Blood Culture adequate volume  Final   Culture   Final    NO GROWTH 5 DAYS Performed at [facility], [address], [place], [place] [address]    Report Status 11/12/2018 FINAL  Final  Surgical PCR screen     Status: None   Collection Time: 11/19/18  8:40 PM   Specimen: Nasal Mucosa; Nasal Swab  Result Value Ref Range Status   MRSA, PCR NEGATIVE NEGATIVE Final   Staphylococcus aureus NEGATIVE NEGATIVE Final    Comment: (NOTE) The Xpert SA Assay (FDA approved for NASAL specimens in patients 68 years of age and older), is one component of a comprehensive surveillance program. It is not intended to diagnose infection nor to guide or monitor treatment. Performed at [facility], [place] [address], [place], [place] [address]   SARS Coronavirus 2 (CEPHEID - Performed in [place] hospital lab), Hosp Order     Status: None   Collection Time: 11/27/18  1:57 PM   Specimen: Nasopharyngeal Swab  Result Value Ref Range Status   SARS Coronavirus 2 NEGATIVE NEGATIVE Final    Comment: (NOTE) If result is NEGATIVE SARS-CoV-2 target nucleic acids are NOT DETECTED. The SARS-CoV-2 RNA is generally detectable in upper and lower  respiratory specimens during the acute phase of infection. The lowest  concentration of SARS-CoV-2 viral copies this assay can detect is 250  copies / mL. A negative result does not preclude SARS-CoV-2 infection  and should not be used as the sole basis for treatment or other  patient management decisions.  A negative result may occur with  improper specimen collection / handling, submission of specimen other  than nasopharyngeal swab, presence of viral mutation(s) within the  areas targeted by this assay, and inadequate number of viral copies  (<250 copies / mL). A negative result must be combined with clinical  observations, patient history, and epidemiological information. If result is POSITIVE SARS-CoV-2 target nucleic acids are  DETECTED. The SARS-CoV-2 RNA is generally detectable in upper and lower  respiratory specimens dur ing the acute phase of infection.  Positive  results are indicative of active infection with SARS-CoV-2.  Clinical  correlation with patient history and other diagnostic information is  necessary to determine patient infection status.  Positive results do  not rule out bacterial infection or co-infection with other viruses. If result is PRESUMPTIVE POSTIVE SARS-CoV-2 nucleic acids MAY BE PRESENT.   A presumptive positive result was obtained on the submitted specimen  and confirmed on repeat testing.  While 2019 novel coronavirus  (SARS-CoV-2) nucleic acids may be present  in the submitted sample  additional confirmatory testing may be necessary for epidemiological  and / or clinical management purposes  to differentiate between  SARS-CoV-2 and other Sarbecovirus currently known to infect humans.  If clinically indicated additional testing with an alternate test  methodology [phone]) is advised. The SARS-CoV-2 RNA is generally  detectable in upper and lower respiratory sp ecimens during the acute  phase of infection. The expected result is Negative. Fact Sheet for Patients:  StrictlyIdeas.no Fact Sheet for Healthcare Providers: BankingDealers.co.za This test is not yet approved or cleared by the [place] FDA and has been authorized for detection and/or diagnosis of SARS-CoV-2 by FDA under an Emergency Use Authorization (EUA).  This EUA will remain in effect (meaning this test can be used) for the duration of the COVID-19 declaration under Section 564(b)(1) of the Act, 21 U.S.C. section 360bbb-3(b)(1), unless the authorization is terminated or revoked sooner. Performed at [facility], [place] [address] [place], [place] [address]     Coagulation Studies: No results for input(s): LABPROT, INR in the last 72  hours.  Urinalysis: No results for input(s): COLORURINE, LABSPEC, PHURINE, GLUCOSEU, HGBUR, BILIRUBINUR, KETONESUR, PROTEINUR, UROBILINOGEN, NITRITE, LEUKOCYTESUR in the last 72 hours.  Invalid input(s): APPERANCEUR    Imaging: Ir US Chest  Result Date: 11/27/2018 CLINICAL DATA:  78 year old female referred for possible thoracentesis EXAM: CHEST ULTRASOUND COMPARISON:  Prior chest x-ray FINDINGS: Ultrasound performed of the left chest demonstrates scant fluid. IMPRESSION: Scant left-sided pleural effusion with no thoracentesis performed. Electronically Signed   By: Corrie Mckusick D.O.   On: 11/27/2018 17:28     Medications:   . sodium chloride    . lactated ringers     . amiodarone  200 mg Oral Daily  . amLODipine  10 mg Oral Daily  . aspirin EC  81 mg Oral Daily  . bisacodyl  10 mg Oral Daily   Or  . bisacodyl  10 mg Rectal Daily  . calcium acetate  667 mg Oral TID WC  . Chlorhexidine Gluconate Cloth  6 each Topical Q0600  . Chlorhexidine Gluconate Cloth  6 each Topical Q0600  . ciprofloxacin-dexamethasone  4 drop Left EAR BID  . darbepoetin (ARANESP) injection - NON-DIALYSIS  100 mcg Subcutaneous Q Mon-1800  . docusate sodium  200 mg Oral Daily  . febuxostat  40 mg Oral Daily  . gabapentin  600 mg Oral QHS  . hydrALAZINE  25 mg Oral BID  . insulin aspart  0-5 Units Subcutaneous QHS  . insulin aspart  0-9 Units Subcutaneous TID WC  . insulin detemir  5 Units Subcutaneous QHS  . pantoprazole  40 mg Oral Daily  . sodium chloride flush  10-40 mL Intracatheter Q12H  . sodium chloride flush  3 mL Intravenous Q12H  . thyroid  120 mg Oral Q0600   acetaminophen, metoprolol tartrate, ondansetron (ZOFRAN) IV, phenol, promethazine, sodium chloride flush, sodium chloride flush  Assessment/ Plan:   End-stage renal disease Tuesday Thursday dialysis clipped to [facility] unit.  Will continue on schedule TTS.   Marland Kitchen  She is off schedule due to high dialysis volumes and did not receive  dialysis 11/25/2018.  She is clipped to [facility] unit TTS.  We shall plan dialysis 11/29/2018 prior to discharge.  Her next dialysis treatment be on Tuesday, 12/02/2018  Anemia hemoglobin 8.2 low iron saturations 11/22/2018 with a transfusion 1 pack blood cells 11/22/2018.  We will continue to follow she is receiving darbepoetin  Bones PTH  114 calcium phosphorus continue to follow phosphorus slightly elevated continues on binders.    Nutrition pro-stat low albumin 2.2  Hypertension controlled added hydralazine 25 mg twice daily  Diabetes as per primary team glucose appears controlled at this point  Status post CABG appreciate input and help from cardiovascular team  Coronary artery disease continues aspirin no statin due to intolerance  Hypothyroidism on replacement therapy  Diastolic heart failure continues on amiodarone.  Hyperlipidemia intolerance of statins defer to primary team  Disposition not quite ready for discharge    LOS: [place] @TODAY @8 :05 AM

## 2018-11-29 NOTE — Progress Notes (Signed)
Progress Note  Patient Name: [name] Date of Encounter: 11/29/2018  Primary Cardiologist: Carlyle Dolly, MD   Subjective   No CP or dyspnea  Inpatient Medications    Scheduled Meds: . amiodarone  200 mg Oral Daily  . amLODipine  10 mg Oral Daily  . aspirin EC  81 mg Oral Daily  . bisacodyl  10 mg Oral Daily   Or  . bisacodyl  10 mg Rectal Daily  . calcium acetate  667 mg Oral TID WC  . Chlorhexidine Gluconate Cloth  6 each Topical Q0600  . Chlorhexidine Gluconate Cloth  6 each Topical Q0600  . ciprofloxacin-dexamethasone  4 drop Left EAR BID  . darbepoetin (ARANESP) injection - NON-DIALYSIS  100 mcg Subcutaneous Q Mon-1800  . docusate sodium  200 mg Oral Daily  . febuxostat  40 mg Oral Daily  . gabapentin  600 mg Oral QHS  . hydrALAZINE  25 mg Oral BID  . insulin aspart  0-5 Units Subcutaneous QHS  . insulin aspart  0-9 Units Subcutaneous TID WC  . insulin detemir  5 Units Subcutaneous QHS  . pantoprazole  40 mg Oral Daily  . sodium chloride flush  10-40 mL Intracatheter Q12H  . sodium chloride flush  3 mL Intravenous Q12H  . thyroid  120 mg Oral Q0600   Continuous Infusions: . sodium chloride    . lactated ringers     PRN Meds: acetaminophen, metoprolol tartrate, ondansetron (ZOFRAN) IV, phenol, promethazine, sodium chloride flush, sodium chloride flush   Vital Signs    Vitals:   11/28/18 2022 11/28/18 2319 11/29/18 0618 11/29/18 0830  BP: (!) 124/45 (!) 130/48 (!) 128/49 (!) 132/52  Pulse: 73 75 73 81  Resp: 19 15 13 15   Temp:  98.8 F (37.1 C) 98.5 F (36.9 C) 98.1 F (36.7 C)  TempSrc:  Oral Oral Oral  SpO2: 97% 91% 91% 93%  Weight:   75.4 kg   Height:   5\' 4"  (1.626 m)     Intake/Output Summary (Last 24 hours) at 11/29/2018 0921 Last data filed at 11/28/2018 1114 Gross per 24 hour  Intake -  Output 3000 ml  Net -3000 ml   Last 3 Weights 11/29/2018 11/28/2018 11/28/2018  Weight (lbs) 166 lb 3.6 oz 163 lb 12.8 oz 169 lb 12.1 oz  Weight (kg)  75.4 kg 74.3 kg 77 kg      Telemetry    Sinus with PAF yesterday   - Personally Reviewed  Physical Exam   GEN: NAD Neck: supple Cardiac: RRR Respiratory: CTA GI: Soft, NT/ND MS: No edema Neuro:  Grossly intact   Labs    Chemistry Recent Labs  Lab 11/25/18 0412 11/26/18 1059 11/28/18 0446  NA 135 136 135  K 4.0 3.9 3.8  CL 100 101 98  CO2 27 24 25   GLUCOSE 100* 143* 101*  BUN 26* 44* 45*  CREATININE 3.32* 5.22* 5.45*  CALCIUM 7.9* 8.5* 8.6*  ALBUMIN  --  2.2*  --   GFRNONAA 13* 7* 7*  GFRAA 15* 8* 8*  ANIONGAP 8 11 12      Hematology Recent Labs  Lab 11/24/18 0527 11/25/18 0412 11/26/18 1059  WBC 6.2 5.6 5.2  RBC 2.69* 2.65* 2.74*  HGB 8.0* 7.9* 8.2*  HCT 26.5* 25.7* 26.9*  MCV 98.5 97.0 98.2  MCH 29.7 29.8 29.9  MCHC 30.2 30.7 30.5  RDW 16.8* 16.2* 15.6*  PLT 185 189 207     Radiology    Ir  US Chest  Result Date: 11/27/2018 CLINICAL DATA:  78 year old female referred for possible thoracentesis EXAM: CHEST ULTRASOUND COMPARISON:  Prior chest x-ray FINDINGS: Ultrasound performed of the left chest demonstrates scant fluid. IMPRESSION: Scant left-sided pleural effusion with no thoracentesis performed. Electronically Signed   By: Corrie Mckusick D.O.   On: 11/27/2018 17:28    Cardiac Studies   Cardiac cath 11/12/18  Prox RCA lesion is 50% stenosed.  Mid RCA to Dist RCA lesion is 80% stenosed.  Dist RCA lesion is 100% stenosed.  Prox Cx lesion is 99% stenosed.  Mid Cx lesion is 99% stenosed.  Dist Cx lesion is 90% stenosed.  1st Mrg lesion is 90% stenosed.  Ost LAD to Prox LAD lesion is 70% stenosed.  Mid LAD lesion is 30% stenosed.   1. 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 Hold Plavix.   CABG 11/20/18 PRE-OPERATIVE DIAGNOSIS:  CAD  POST-OPERATIVE DIAGNOSIS:  CAD   PROCEDURE:  Procedure(s): CORONARY ARTERY BYPASS GRAFTING (CABG), ON PUMP, TIMES four, USING LEFT INTERNAL MAMMARY ARTERY AND ENDOSCOPICALLY HARVESTED RIGHT GREATER SAPHENOUS VEIN (N/A) TRANSESOPHAGEAL ECHOCARDIOGRAM (TEE) (N/A)   Patient Profile     78 y.o. female with NSTEMI s/p CABG x 4, with atrial fibrillation, anemia, CKD on IHD, DM2, hypothyroidism, ESRD on dialysis.   Assessment & Plan    CAD status post coronary artery bypass and graft-doing well postoperatively.  Continue aspirin.  Intolerant to statins.  Paroxysmal atrial fibrillation-had another episode of atrial fibrillation yesterday.  Now back in sinus rhythm.  Continue amiodarone at present dose. Given that she is now 9 days postop I am concerned about risk of thromboembolic event including CVA.  Will treat with Coumadin with goal INR 2-3.  Can consider discontinuing in 3 to 4 months if sinus maintained.  HLD with statin intolerance -can consider referral to lipid clinic in the future.  ESRD-Per nephrology  HTN-continue present medications and follow.     For questions or updates, please contact [place] Please consult [email or web address] for contact info under        Signed, Kirk Ruths, MD  11/29/2018, 9:21 AM

## 2018-11-29 NOTE — Progress Notes (Addendum)
[address]       [place] [address]             [phone]      9 Days Post-Op Procedure(s) (LRB): CORONARY ARTERY BYPASS GRAFTING (CABG), ON PUMP, TIMES four, USING LEFT INTERNAL MAMMARY ARTERY AND ENDOSCOPICALLY HARVESTED RIGHT GREATER SAPHENOUS VEIN (N/A) TRANSESOPHAGEAL ECHOCARDIOGRAM (TEE) (N/A) Subjective: Still having occasional incisional pain. Feels a little better this morning.   Objective: Vital signs in last 24 hours: Temp:  [98.3 F (36.8 C)-98.8 F (37.1 C)] 98.5 F (36.9 C) (07/04 0618) Pulse Rate:  [72-128] 73 (07/04 0618) Cardiac Rhythm: Normal sinus rhythm (07/04 0700) Resp:  [13-20] 13 (07/04 0618) BP: (107-145)/(38-75) 128/49 (07/04 0618) SpO2:  [91 %-98 %] 91 % (07/04 0618) Weight:  [74.3 kg-77 kg] 75.4 kg (07/04 0618)     Intake/Output from previous day: 07/03 0701 - 07/04 0700 In: -  Out: 3000  Intake/Output this shift: No intake/output data recorded.  General appearance: alert, cooperative and no distress Heart: regular rate and rhythm, S1, S2 normal, no murmur, click, rub or gallop Lungs: clear to auscultation bilaterally Abdomen: soft, non-tender; bowel sounds normal; no masses,  no organomegaly Extremities: extremities normal, atraumatic, no cyanosis or edema Wound: clean and dry  Lab Results: Recent Labs    11/26/18 1059  WBC 5.2  HGB 8.2*  HCT 26.9*  PLT 207   BMET:  Recent Labs    11/26/18 1059 11/28/18 0446  NA 136 135  K 3.9 3.8  CL 101 98  CO2 24 25  GLUCOSE 143* 101*  BUN 44* 45*  CREATININE 5.22* 5.45*  CALCIUM 8.5* 8.6*    PT/INR: No results for input(s): LABPROT, INR in the last 72 hours. ABG    Component Value Date/Time   PHART 7.343 (L) 11/20/2018 2328   HCO3 24.6 11/20/2018 2328   TCO2 26 11/20/2018 2328   ACIDBASEDEF 1.0 11/20/2018 2328   O2SAT 100.0 11/20/2018 2328   CBG (last 3)  Recent Labs    11/28/18 1638 11/28/18 2117 11/29/18 0627  GLUCAP 118* 141* 113*     Assessment/Plan: S/P Procedure(s) (LRB): CORONARY ARTERY BYPASS GRAFTING (CABG), ON PUMP, TIMES four, USING LEFT INTERNAL MAMMARY ARTERY AND ENDOSCOPICALLY HARVESTED RIGHT GREATER SAPHENOUS VEIN (N/A) TRANSESOPHAGEAL ECHOCARDIOGRAM (TEE) (N/A)  1. CV-Afib from yesterday noted. Converted shortly after with daily dose of 200mg  Amio and Lopressor 2.5x1. EKG ordered and Dr. Debara Pickett reviewed. Continue Norvasc 10mg  and Hydralazine 25mg  BID. She is in NSR in the 70s this morning. Cardiology aware of the rhythm change.  2. Pulm- tolerating room air with good oxygen saturation. Continue to encourage incentive spirometer use.  3. Anemia of chronic disease-8.2/26.9, has remains stable 4. CKD stage III- Nephrology following. HD should be T.TH.Sat but got off schedule  And was dialyzed yesterday.  5. DM- [id number] current regimen 6. Hypothyroidism-continue Arm[place] 120 mg daily 7. Debility- Patient states her legs are very weak. Per nursing she is barely able to get to the bedside commode or to the door. PT to evaluate today and leave recs. She does have home PT setup but now questioning if she may need more help. Nursing spoke to family yesterday and updated them on her current condition.   Plan: PT to evaluate this morning. Continue to monitor rhythm closely. Hopefully able to discharge soon either to home or potentially CIR.     LOS: 22 days    [name] 11/29/2018 patient seen and examined,  agree with above Dc home after HD today Will dc on coumadin  Remo Lipps C. Roxan Hockey, MD [facility] [phone]

## 2018-11-29 NOTE — Progress Notes (Signed)
Pt ambulated in hallway with front wheeled walker approximately 400 feet without difficulty.  Patient tolerated well.  Will continue to monitor.

## 2018-11-30 LAB — GLUCOSE, CAPILLARY
Glucose-Capillary: 104 mg/dL — ABNORMAL HIGH (ref 70–99)
Glucose-Capillary: 179 mg/dL — ABNORMAL HIGH (ref 70–99)
Glucose-Capillary: 118 mg/dL — ABNORMAL HIGH (ref 70–99)

## 2018-11-30 LAB — PROTIME-INR
INR: 1.2 (ref 0.8–1.2)
Prothrombin Time: 14.7 seconds (ref 11.4–15.2)

## 2018-11-30 MED ORDER — WARFARIN SODIUM 5 MG PO TABS
5.0000 mg | ORAL_TABLET | Freq: Once | ORAL | Status: AC
  Administered 2018-11-30: 5 mg via ORAL
  Filled 2018-11-30: qty 1

## 2018-11-30 MED ORDER — COUMADIN BOOK
Freq: Once | Status: AC
  Administered 2018-11-30: 18:00:00
  Filled 2018-11-30 (×2): qty 1

## 2018-11-30 NOTE — Discharge Instructions (Signed)
Discharge Instructions:  1. You may shower, please wash incisions daily with soap and water and keep dry.  If you wish to cover wounds with dressing you may do so but please keep clean and change daily.  No tub baths or swimming until incisions have completely healed.  If your incisions become red or develop any drainage please call our office at [phone]  2. No Driving until cleared by Dr. Leonarda Salon office and you are no longer using narcotic pain medications  3. Monitor your weight daily.. Please use the same scale and weigh at same time... If you gain 5-10 lbs in 48 hours with associated lower extremity swelling, please contact our office at [phone]  4. Fever of 101.5 for at least 24 hours with no source, please contact our office at [phone]  5. Activity- up as tolerated, please walk at least 3 times per day.  Avoid strenuous activity, no lifting, pushing, or pulling with your arms over 8-10 lbs for a minimum of 6 weeks  6. If any questions or concerns arise, please do not hesitate to contact our office at [phone]     Information on my medicine - Coumadin   (Warfarin)  Why was Coumadin prescribed for you? Coumadin was prescribed for you because you have a blood clot or a medical condition that can cause an increased risk of forming blood clots. Blood clots can cause serious health problems by blocking the flow of blood to the heart, lung, or brain. Coumadin can prevent harmful blood clots from forming. As a reminder your indication for Coumadin is:   Stroke Prevention Because Of Atrial Fibrillation  What test will check on my response to Coumadin? While on Coumadin (warfarin) you will need to have an INR test regularly to ensure that your dose is keeping you in the desired range. The INR (international normalized ratio) number is calculated from the result of the laboratory test called prothrombin time (PT).  If an INR APPOINTMENT HAS NOT ALREADY BEEN MADE FOR YOU  please schedule an appointment to have this lab work done by your health care provider within 7 days. Your INR goal is usually a number between:  2 to 3 or your provider may give you a more narrow range like 2-2.5.  Ask your health care provider during an office visit what your goal INR is.  What  do you need to  know  About  COUMADIN? Take Coumadin (warfarin) exactly as prescribed by your healthcare provider about the same time each day.  DO NOT stop taking without talking to the doctor who prescribed the medication.  Stopping without other blood clot prevention medication to take the place of Coumadin may increase your risk of developing a new clot or stroke.  Get refills before you run out.  What do you do if you miss a dose? If you miss a dose, take it as soon as you remember on the same day then continue your regularly scheduled regimen the next day.  Do not take two doses of Coumadin at the same time.  Important Safety Information A possible side effect of Coumadin (Warfarin) is an increased risk of bleeding. You should call your healthcare provider right away if you experience any of the following: ? Bleeding from an injury or your nose that does not stop. ? Unusual colored urine (red or dark brown) or unusual colored stools (red or black). ? Unusual bruising for unknown reasons. ? A serious fall or if you hit your head (  even if there is no bleeding).  Some foods or medicines interact with Coumadin (warfarin) and might alter your response to warfarin. To help avoid this: ? Eat a balanced diet, maintaining a consistent amount of Vitamin K. ? Notify your provider about major diet changes you plan to make. ? Avoid alcohol or limit your intake to 1 drink for women and 2 drinks for men per day. (1 drink is 5 oz. wine, 12 oz. beer, or 1.5 oz. liquor.)  Make sure that ANY health care provider who prescribes medication for you knows that you are taking Coumadin (warfarin).  Also make sure the  healthcare provider who is monitoring your Coumadin knows when you have started a new medication including herbals and non-prescription products.  Coumadin (Warfarin)  Major Drug Interactions  Increased Warfarin Effect Decreased Warfarin Effect  Alcohol (large quantities) Antibiotics (esp. Septra/Bactrim, Flagyl, Cipro) Amiodarone (Cordarone) Aspirin (ASA) Cimetidine (Tagamet) Megestrol (Megace) NSAIDs (ibuprofen, naproxen, etc.) Piroxicam (Feldene) Propafenone (Rythmol SR) Propranolol (Inderal) Isoniazid (INH) Posaconazole (Noxafil) Barbiturates (Phenobarbital) Carbamazepine (Tegretol) Chlordiazepoxide (Librium) Cholestyramine (Questran) Griseofulvin Oral Contraceptives Rifampin Sucralfate (Carafate) Vitamin K   Coumadin (Warfarin) Major Herbal Interactions  Increased Warfarin Effect Decreased Warfarin Effect  Garlic Ginseng Ginkgo biloba Coenzyme Q10 Green tea St. Johns wort    Coumadin (Warfarin) FOOD Interactions  Eat a consistent number of servings per week of foods HIGH in Vitamin K (1 serving =  cup)  Collards (cooked, or boiled & drained) Kale (cooked, or boiled & drained) Mustard greens (cooked, or boiled & drained) Parsley *serving size only =  cup Spinach (cooked, or boiled & drained) Swiss chard (cooked, or boiled & drained) Turnip greens (cooked, or boiled & drained)  Eat a consistent number of servings per week of foods MEDIUM-HIGH in Vitamin K (1 serving = 1 cup)  Asparagus (cooked, or boiled & drained) Broccoli (cooked, boiled & drained, or raw & chopped) Brussel sprouts (cooked, or boiled & drained) *serving size only =  cup Lettuce, raw (green leaf, endive, romaine) Spinach, raw Turnip greens, raw & chopped   These websites have more information on Coumadin (warfarin):  FailFactory.se; VeganReport.com.au;

## 2018-11-30 NOTE — Progress Notes (Signed)
Progress Note  Patient Name: [name] Date of Encounter: 11/30/2018  Primary Cardiologist: Carlyle Dolly, MD   Subjective   Pt denies CP or dyspnea  Inpatient Medications    Scheduled Meds: . amiodarone  200 mg Oral Daily  . amLODipine  10 mg Oral Daily  . aspirin EC  81 mg Oral Daily  . bisacodyl  10 mg Oral Daily   Or  . bisacodyl  10 mg Rectal Daily  . calcium acetate  667 mg Oral TID WC  . Chlorhexidine Gluconate Cloth  6 each Topical Q0600  . Chlorhexidine Gluconate Cloth  6 each Topical Q0600  . ciprofloxacin-dexamethasone  4 drop Left EAR BID  . darbepoetin (ARANESP) injection - NON-DIALYSIS  100 mcg Subcutaneous Q Mon-1800  . docusate sodium  200 mg Oral Daily  . febuxostat  40 mg Oral Daily  . gabapentin  600 mg Oral QHS  . hydrALAZINE  25 mg Oral BID  . insulin aspart  0-5 Units Subcutaneous QHS  . insulin aspart  0-9 Units Subcutaneous TID WC  . insulin detemir  5 Units Subcutaneous QHS  . pantoprazole  40 mg Oral Daily  . sodium chloride flush  10-40 mL Intracatheter Q12H  . sodium chloride flush  3 mL Intravenous Q12H  . thyroid  120 mg Oral Q0600  . Warfarin - Physician Dosing Inpatient   Does not apply q1800   Continuous Infusions: . sodium chloride    . lactated ringers     PRN Meds: acetaminophen, metoprolol tartrate, ondansetron (ZOFRAN) IV, phenol, promethazine, sodium chloride flush, sodium chloride flush   Vital Signs    Vitals:   11/29/18 1621 11/29/18 2100 11/29/18 2351 11/30/18 0632  BP: (!) 114/47 (!) 138/44 (!) 127/45   Pulse:  81 82   Resp:  18 17   Temp:  (!) 97.5 F (36.4 C) 98.8 F (37.1 C)   TempSrc:  Oral    SpO2:  93% 92% 92%  Weight:      Height:        Intake/Output Summary (Last 24 hours) at 11/30/2018 0848 Last data filed at 11/29/2018 1355 Gross per 24 hour  Intake 177 ml  Output 1000 ml  Net -823 ml   Last 3 Weights 11/29/2018 11/29/2018 11/29/2018  Weight (lbs) 164 lb 7.4 oz 166 lb 10.7 oz 166 lb 3.6 oz   Weight (kg) 74.6 kg 75.6 kg 75.4 kg      Telemetry    Sinus with PAF/atrial flutter   - Personally Reviewed  Physical Exam   GEN: WD/WN NAD Neck: no JVD Cardiac: RRR, no murmur Respiratory: CTA; no wheeze; s/p sternotomy GI: Soft, NT/ND, no masses MS: No edema Neuro:  No focal findings   Labs    Chemistry Recent Labs  Lab 11/26/18 1059 11/28/18 0446 11/29/18 1157  NA 136 135 138  K 3.9 3.8 3.6  CL 101 98 100  CO2 24 25 25   GLUCOSE 143* 101* 188*  BUN 44* 45* 38*  CREATININE 5.22* 5.45* 5.33*  CALCIUM 8.5* 8.6* 8.8*  ALBUMIN 2.2*  --  2.5*  GFRNONAA 7* 7* 7*  GFRAA 8* 8* 8*  ANIONGAP 11 12 13      Hematology Recent Labs  Lab 11/25/18 0412 11/26/18 1059 11/29/18 1040  WBC 5.6 5.2 6.9  RBC 2.65* 2.74* 3.06*  HGB 7.9* 8.2* 9.1*  HCT 25.7* 26.9* 29.6*  MCV 97.0 98.2 96.7  MCH 29.8 29.9 29.7  MCHC 30.7 30.5 30.7  RDW  16.2* 15.6* 14.8  PLT 189 207 331      Cardiac Studies   Cardiac cath 11/12/18  Prox RCA lesion is 50% stenosed.  Mid RCA to Dist RCA lesion is 80% stenosed.  Dist RCA lesion is 100% stenosed.  Prox Cx lesion is 99% stenosed.  Mid Cx lesion is 99% stenosed.  Dist Cx lesion is 90% stenosed.  1st Mrg lesion is 90% stenosed.  Ost LAD to Prox LAD lesion is 70% stenosed.  Mid LAD lesion is 30% stenosed.   1. 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 Hold Plavix.   CABG 11/20/18 PRE-OPERATIVE DIAGNOSIS:  CAD  POST-OPERATIVE DIAGNOSIS:  CAD  PROCEDURE:  Procedure(s): CORONARY ARTERY BYPASS GRAFTING (CABG), ON PUMP, TIMES four, USING LEFT INTERNAL MAMMARY ARTERY AND ENDOSCOPICALLY HARVESTED RIGHT GREATER SAPHENOUS VEIN (N/A) TRANSESOPHAGEAL ECHOCARDIOGRAM (TEE) (N/A)   Patient Profile     78 y.o. female with NSTEMI s/p CABG x 4, with atrial fibrillation, anemia, CKD  on IHD, DM2, hypothyroidism, ESRD on dialysis.   Assessment & Plan    CAD status post coronary artery bypass and graft-doing well postoperatively; no CP or dyspnea.  Continue aspirin.  Intolerant to statins.  Paroxysmal atrial fibrillation-remains in sinus this AM. Continue amiodarone at present dose.  She has had intermittent atrial fibrillation following surgery.  There is a note saying she had atrial flutter yesterday but I cannot find this on telemetry.  She is in sinus rhythm this morning.  Given that she continues with atrial arrhythmias this far postoperatively I have recommended Coumadin.  This could be potentially discontinued in 3 to 4 months if her atrial arrhythmias resolved.  Will check INR in Coumadin clinic in [place] on Wednesday, July 8.  Goal INR 2-3.  She does have a history of GI bleed previously and underwent clipping of an unknown vessel and has had no problems since by her report.  No records available.  HLD with statin intolerance -can consider referral to lipid clinic in the future.  ESRD-Per nephrology  HTN-continue present medications and follow.     For questions or updates, please contact [place] Please consult [email or web address] for contact info under        Signed, Kirk Ruths, MD  11/30/2018, 8:48 AM

## 2018-11-30 NOTE — Progress Notes (Signed)
Discharge instructions given to Mrs. Witz.  Discussed signs and symptoms to watch for and when to contact the physician.  Discussed follow up appointments, activities and lifting restrictions.  Discussed new medications, medication changes, and side effects.  Verbalized understanding.

## 2018-11-30 NOTE — Progress Notes (Signed)
Occupational Therapy Evaluation  Pt admitted with the below diagnoses, and demonstrates the below listed deficits.  She is able to perform ADLs with min guard assist - she demonstrates generalized weakness, mild balance deficits, and decreased activity tolerance.  She would benefit from a tub transfer bench for home use, and she was instructed on use and acquisition.  She is eager to discharge home, and reports good support from roommate, and friends.  No further OT needs identified.  OT will sign off at this time.    11/30/18 1000  OT Visit Information  Last OT Received On 11/30/18  Assistance Needed +1  History of Present Illness 78 year old WF PMHx  DM 2 controlled with complication, HTN, CKD stage 5, CAD s/p STEMI treated medically due to advanced CKD and anemia, gout, chronic combined systolic and diastolic CHF, Hypothyroid, Admitted 11/06/2018 with diagnosis of NSTEMI, acute on chronic diastolic CHF, progressive CKD to ESRD. Nephrology consulted and HD initiated this admission. Cardiology consulted, cardiac cath 6/17 showed severe three-vessel disease, TCTS consulted and plan CABG after Plavix washout (last dose 11/12/2018). Patient for CABG on 11/20/2018. Pt with episode of Afib 11/28/2018.   Precautions  Precautions Sternal  Precaution Booklet Issued No  Precaution Comments pt demonstrates good awareness of precautions   Restrictions  Weight Bearing Restrictions Yes (sternal precautions)  Home Living  Family/patient expects to be discharged to: Private residence  Living Arrangements Spouse/significant other  Available Help at Discharge Friend(s);Available 24 hours/day  Type of Home House  Home Access Stairs to enter  Entrance Stairs-Number of Steps 6  Entrance Stairs-Rails Right;Left  Home Layout One level  Bathroom Biomedical scientist Yes  [place] - 2 wheels;Tub bench;Grab bars - tub/shower;Hand held shower  head;Toilet riser  Prior Function  Level of Independence Independent  Comments does not drive   Communication  Communication No difficulties  Pain Assessment  Pain Assessment No/denies pain  Cognition  Arousal/Alertness Awake/alert  Behavior During Therapy WFL for tasks assessed/performed  Overall Cognitive Status Within Functional Limits for tasks assessed  Upper Extremity Assessment  Upper Extremity Assessment Generalized weakness  Lower Extremity Assessment  Lower Extremity Assessment Generalized weakness  Cervical / Trunk Assessment  Cervical / Trunk Assessment Normal  ADL  Overall ADL's  Needs assistance/impaired  Eating/Feeding Independent  Grooming Wash/dry hands;Wash/dry face;Oral care;Brushing hair;Supervision/safety;Standing  Upper Body Bathing Set up;Sitting  Lower Body Bathing Min guard;Sit to/from stand  Upper Body Dressing  Set up;Sitting  Lower Body Dressing Min guard;Sit to/from Environmental education officer guard;Ambulation;Comfort height toilet;RW  Actuary Min guard;Sit to/from stand  Tub/ Banker Tub transfer;Min guard;Ambulation;Rolling walker  Tub/Shower Transfer Details (indicate cue type and reason) Pt instructed on use and acquisition of tub transfer bench   Bed Mobility  Overal bed mobility Modified Independent  General bed mobility comments requires increased time and effort   Transfers  Overall transfer level Needs assistance  Equipment used Rolling walker (2 wheeled)  Transfers Sit to/from [employer] Transfers  Sit to Stand Min guard  Stand pivot transfers Min guard  General transfer comment pt rocks to gain momentum.  Min guard for safety   Balance  Overall balance assessment Needs assistance  Sitting-balance support Feet supported;No upper extremity supported  Sitting balance-Leahy Scale Good  Sitting balance - Comments able to don/doff socks mod I   Standing balance support Bilateral upper  extremity supported;During functional activity  Standing balance-Leahy Scale Fair  Standing balance comment able to maintain static standing with min guard assist   General Comments  General comments (skin integrity, edema, etc.) VSS   OT - End of Session  Equipment Utilized During Treatment Rolling walker  Activity Tolerance Patient tolerated treatment well  Patient left in bed;with call bell/phone within reach;with bed alarm set  Nurse Communication Mobility status  OT Assessment  OT Recommendation/Assessment Patient does not need any further OT services  OT Visit Diagnosis Unsteadiness on feet (R26.81)  OT Problem List Decreased strength;Decreased activity tolerance;Impaired balance (sitting and/or standing);Decreased knowledge of precautions  AM-PAC OT "6 Clicks" Daily Activity Outcome Measure (Version 2)  Help from another person eating meals? 4  Help from another person taking care of personal grooming? 3  Help from another person toileting, which includes using toliet, bedpan, or urinal? 3  Help from another person bathing (including washing, rinsing, drying)? 3  Help from another person to put on and taking off regular upper body clothing? 4  Help from another person to put on and taking off regular lower body clothing? 3  6 Click Score 20  OT Recommendation  Follow Up Recommendations No OT follow up;Supervision - Intermittent  OT Equipment 3 in 1 bedside commode (has been ordered and delivered )  Acute Rehab OT Goals  Patient Stated Goal go home and see my chickens   OT Goal Formulation All assessment and education complete, DC therapy  OT Time Calculation  OT Start Time (ACUTE ONLY) 0939  OT Stop Time (ACUTE ONLY) 1019  OT Time Calculation (min) 40 min  OT General Charges  $OT Visit 1 Visit  OT Evaluation  $OT Eval Moderate Complexity 1 Mod  OT Treatments  $Self Care/Home Management  8-22 mins    11/30/18 1000  OT Visit Information  Last OT Received On 11/30/18   Assistance Needed +1  History of Present Illness 78 year old WF PMHx  DM 2 controlled with complication, HTN, CKD stage 5, CAD s/p STEMI treated medically due to advanced CKD and anemia, gout, chronic combined systolic and diastolic CHF, Hypothyroid, Admitted 11/06/2018 with diagnosis of NSTEMI, acute on chronic diastolic CHF, progressive CKD to ESRD. Nephrology consulted and HD initiated this admission. Cardiology consulted, cardiac cath 6/17 showed severe three-vessel disease, TCTS consulted and plan CABG after Plavix washout (last dose 11/12/2018). Patient for CABG on 11/20/2018. Pt with episode of Afib 11/28/2018.   Precautions  Precautions Sternal  Precaution Booklet Issued No  Precaution Comments pt demonstrates good awareness of precautions   Restrictions  Weight Bearing Restrictions Yes (sternal precautions)  Home Living  Family/patient expects to be discharged to: Private residence  Living Arrangements Spouse/significant other  Available Help at Discharge Friend(s);Available 24 hours/day  Type of Home House  Home Access Stairs to enter  Entrance Stairs-Number of Steps 6  Entrance Stairs-Rails Right;Left  Home Layout One level  Bathroom Biomedical scientist Yes  [place] - 2 wheels;Tub bench;Grab bars - tub/shower;Hand held shower head;Toilet riser  Prior Function  Level of Independence Independent  Comments does not drive   Communication  Communication No difficulties  Pain Assessment  Pain Assessment No/denies pain  Cognition  Arousal/Alertness Awake/alert  Behavior During Therapy WFL for tasks assessed/performed  Overall Cognitive Status Within Functional Limits for tasks assessed  Upper Extremity Assessment  Upper Extremity Assessment Generalized weakness  Lower Extremity Assessment  Lower Extremity Assessment Generalized weakness  Cervical / Trunk Assessment  Cervical / Trunk Assessment Normal  ADL   Overall ADL's  Needs assistance/impaired  Eating/Feeding Independent  Grooming Wash/dry hands;Wash/dry face;Oral care;Brushing hair;Supervision/safety;Standing  Upper Body Bathing Set up;Sitting  Lower Body Bathing Min guard;Sit to/from stand  Upper Body Dressing  Set up;Sitting  Lower Body Dressing Min guard;Sit to/from Environmental education officer guard;Ambulation;Comfort height toilet;RW  Actuary Min guard;Sit to/from stand  Tub/ Banker Tub transfer;Min guard;Ambulation;Rolling walker  Tub/Shower Transfer Details (indicate cue type and reason) Pt instructed on use and acquisition of tub transfer bench   Bed Mobility  Overal bed mobility Modified Independent  General bed mobility comments requires increased time and effort   Transfers  Overall transfer level Needs assistance  Equipment used Rolling walker (2 wheeled)  Transfers Sit to/from [employer] Transfers  Sit to Stand Min guard  Stand pivot transfers Min guard  General transfer comment pt rocks to gain momentum.  Min guard for safety   Balance  Overall balance assessment Needs assistance  Sitting-balance support Feet supported;No upper extremity supported  Sitting balance-Leahy Scale Good  Sitting balance - Comments able to don/doff socks mod I   Standing balance support Bilateral upper extremity supported;During functional activity  Standing balance-Leahy Scale Fair  Standing balance comment able to maintain static standing with min guard assist   General Comments  General comments (skin integrity, edema, etc.) VSS   OT - End of Session  Equipment Utilized During Treatment Rolling walker  Activity Tolerance Patient tolerated treatment well  Patient left in bed;with call bell/phone within reach;with bed alarm set  Nurse Communication Mobility status  OT Assessment  OT Recommendation/Assessment Patient does not need any further OT services  OT Visit Diagnosis  Unsteadiness on feet (R26.81)  OT Problem List Decreased strength;Decreased activity tolerance;Impaired balance (sitting and/or standing);Decreased knowledge of precautions  AM-PAC OT "6 Clicks" Daily Activity Outcome Measure (Version 2)  Help from another person eating meals? 4  Help from another person taking care of personal grooming? 3  Help from another person toileting, which includes using toliet, bedpan, or urinal? 3  Help from another person bathing (including washing, rinsing, drying)? 3  Help from another person to put on and taking off regular upper body clothing? 4  Help from another person to put on and taking off regular lower body clothing? 3  6 Click Score 20  OT Recommendation  Follow Up Recommendations No OT follow up;Supervision - Intermittent  OT Equipment 3 in 1 bedside commode (has been ordered and delivered )  Acute Rehab OT Goals  Patient Stated Goal go home and see my chickens   OT Goal Formulation All assessment and education complete, DC therapy  OT Time Calculation  OT Start Time (ACUTE ONLY) 0939  OT Stop Time (ACUTE ONLY) 1019  OT Time Calculation (min) 40 min  OT General Charges  $OT Visit 1 Visit  OT Evaluation  $OT Eval Moderate Complexity 1 Mod  OT Treatments  $Self Care/Home Management  8-22 mins  Lucille Passy, OTR/L Acute Rehabilitation Services Pager [phone] Office [phone]

## 2018-11-30 NOTE — Progress Notes (Signed)
[facility] ROUNDING NOTE   Subjective:   This is a 78 year old lady that has a history of end-stage renal disease she is scheduled Tuesday Thursday Saturday dialysis at [facility].  She was a new start 11/07/2018.  Her last dialysis treatment was 11/29/2018 with removal of 1 L..  She underwent coronary artery bypass grafting 11/20/2018.  She has a history of congestive heart failure with an ejection fraction of 50 to 55% and inferior wall hypokinesis.  2D echo performed 08/2016.  Appears to be comfortable this morning still requiring oxygen appreciate assistance from Dr. Roxan Hockey.  Progression towards discharge with home health and possible home PT. appears to have had an episode of atrial fibrillation rapid ventricular rate 11/29/2018 has been seen by cardiology.  She is now having to be anticoagulated with Coumadin and appreciate assistance from Dr. Kirk Ruths  Blood pressure 120/51 pulse 81 temperature 98.8 O2 sats 87% room air  Sodium 136 potassium 3.6 chloride 100 CO2 25 BUN 58 creatinine 5.3 glucose 188 calcium 8.8 phosphorus 3.3 albumin 2.5 WBC 6.9 hemoglobin 9.1 platelets 331  Status post blood transfusion 1 unit 11/22/2018  Amlodipine 10 mg daily, amiodarone 200 mg daily aspirin 81 mg daily, PhosLo 667 mg 3 times daily with meals, darbepoetin 100 mcg q. Monday subcutaneous.  Uloric 40 mg daily gabapentin 600 mg nightly, insulin sliding scale, Levemir 5 units nightly,  Protonix 40 mg daily, thyroid 120 mg daily.  Hydralazine 25 mg twice daily,  CT scan of chest 11/07/2018 showed bilateral multifocal airspace consolidation cardiac enlargement with 3 vessel coronary artery calcifications pulmonary edema and bilateral pleural effusions.  Chest x-ray 11/26/2018 atrial ectasis with small pleural effusion aeration mildly improved.  Hemoglobin A1c 5.2 11/20/2018 PTH 114 10/29/2018   TSH 0.326 11/06/2018    Objective:  Vital signs in last 24 hours:  Temp:  [97.5 F (36.4 C)-98.8 F  (37.1 C)] 98.8 F (37.1 C) (07/04 2351) Pulse Rate:  [77-88] 82 (07/04 2351) Resp:  [15-22] 17 (07/04 2351) BP: (96-149)/(42-72) 127/45 (07/04 2351) SpO2:  [92 %-100 %] 92 % (07/05 1950) Weight:  [74.6 kg-75.6 kg] 74.6 kg (07/04 1355)  Weight change: -1.4 kg Filed Weights   11/29/18 0618 11/29/18 1119 11/29/18 1355  Weight: 75.4 kg 75.6 kg 74.6 kg    Intake/Output: I/O last 3 completed shifts: In: 177 [P.O.:177] Out: 1000 [Other:1000]   Intake/Output this shift:  No intake/output data recorded.  CVS- RRR midline sternotomy scar faint systolic murmur RS- CTA double-lumen catheter right IJ ABD- BS present soft non-distended EXT- no edema AV fistula right arm   Basic Metabolic Panel: Recent Labs  Lab 11/24/18 0527 11/25/18 0412 11/26/18 1059 11/28/18 0446 11/29/18 1157  NA 137 135 136 135 138  K 4.1 4.0 3.9 3.8 3.6  CL 99 100 101 98 100  CO2 26 27 24 25 25   GLUCOSE 111* 100* 143* 101* 188*  BUN 48* 26* 44* 45* 38*  CREATININE 4.81* 3.32* 5.22* 5.45* 5.33*  CALCIUM 8.2* 7.9* 8.5* 8.6* 8.8*  MG 2.2 1.9 2.2  --   --   PHOS  --   --  5.1*  --  3.3    Liver Function Tests: Recent Labs  Lab 11/26/18 1059 11/29/18 1157  ALBUMIN 2.2* 2.5*   No results for input(s): LIPASE, AMYLASE in the last 168 hours. No results for input(s): AMMONIA in the last 168 hours.  CBC: Recent Labs  Lab 11/24/18 0527 11/25/18 0412 11/26/18 1059 11/29/18 1040  WBC 6.2  5.6 5.2 6.9  HGB 8.0* 7.9* 8.2* 9.1*  HCT 26.5* 25.7* 26.9* 29.6*  MCV 98.5 97.0 98.2 96.7  PLT 185 189 207 331    Cardiac Enzymes: No results for input(s): CKTOTAL, CKMB, CKMBINDEX, TROPONINI in the last 168 hours.  BNP: Invalid input(s): POCBNP  CBG: Recent Labs  Lab 11/29/18 1051 11/29/18 1532 11/29/18 1850 11/29/18 2054 11/30/18 0632  GLUCAP 193* 111* 157* 150* 104*    Microbiology: Results for orders placed or performed during the hospital encounter of 11/06/18  SARS Coronavirus 2 (CEPHEID -  Performed in [place] hospital lab), Hosp Order     Status: None   Collection Time: 11/06/18  1:30 PM   Specimen: Nasopharyngeal Swab  Result Value Ref Range Status   SARS Coronavirus 2 NEGATIVE NEGATIVE Final    Comment: (NOTE) If result is NEGATIVE SARS-CoV-2 target nucleic acids are NOT DETECTED. The SARS-CoV-2 RNA is generally detectable in upper and lower  respiratory specimens during the acute phase of infection. The lowest  concentration of SARS-CoV-2 viral copies this assay can detect is 250  copies / mL. A negative result does not preclude SARS-CoV-2 infection  and should not be used as the sole basis for treatment or other  patient management decisions.  A negative result may occur with  improper specimen collection / handling, submission of specimen other  than nasopharyngeal swab, presence of viral mutation(s) within the  areas targeted by this assay, and inadequate number of viral copies  (<250 copies / mL). A negative result must be combined with clinical  observations, patient history, and epidemiological information. If result is POSITIVE SARS-CoV-2 target nucleic acids are DETECTED. The SARS-CoV-2 RNA is generally detectable in upper and lower  respiratory specimens dur ing the acute phase of infection.  Positive  results are indicative of active infection with SARS-CoV-2.  Clinical  correlation with patient history and other diagnostic information is  necessary to determine patient infection status.  Positive results do  not rule out bacterial infection or co-infection with other viruses. If result is PRESUMPTIVE POSTIVE SARS-CoV-2 nucleic acids MAY BE PRESENT.   A presumptive positive result was obtained on the submitted specimen  and confirmed on repeat testing.  While 2019 novel coronavirus  (SARS-CoV-2) nucleic acids may be present in the submitted sample  additional confirmatory testing may be necessary for epidemiological  and / or clinical management  purposes  to differentiate between  SARS-CoV-2 and other Sarbecovirus currently known to infect humans.  If clinically indicated additional testing with an alternate test  methodology [phone]) is advised. The SARS-CoV-2 RNA is generally  detectable in upper and lower respiratory sp ecimens during the acute  phase of infection. The expected result is Negative. Fact Sheet for Patients:  StrictlyIdeas.no Fact Sheet for Healthcare Providers: BankingDealers.co.za This test is not yet approved or cleared by the [place] FDA and has been authorized for detection and/or diagnosis of SARS-CoV-2 by FDA under an Emergency Use Authorization (EUA).  This EUA will remain in effect (meaning this test can be used) for the duration of the COVID-19 declaration under Section 564(b)(1) of the Act, 21 U.S.C. section 360bbb-3(b)(1), unless the authorization is terminated or revoked sooner. Performed at [facility], [address] [place], [place] [address]   MRSA PCR Screening     Status: None   Collection Time: 11/07/18  6:09 AM   Specimen: Nasal Mucosa; Nasopharyngeal  Result Value Ref Range Status   MRSA by PCR NEGATIVE NEGATIVE Final  Comment:        The GeneXpert MRSA Assay (FDA approved for NASAL specimens only), is one component of a comprehensive MRSA colonization surveillance program. It is not intended to diagnose MRSA infection nor to guide or monitor treatment for MRSA infections. Performed at [facility], [address] [place], [place] [address]   Culture, blood (routine x 2)     Status: None   Collection Time: 11/07/18  7:38 AM   Specimen: Right Antecubital; Blood  Result Value Ref Range Status   Specimen Description   Final    RIGHT ANTECUBITAL BOTTLES DRAWN AEROBIC AND ANAEROBIC   Special Requests Blood Culture adequate volume  Final   Culture   Final    NO GROWTH 5 DAYS Performed at [facility], [address]  [place], [place] [address]    Report Status 11/12/2018 FINAL  Final  Culture, blood (routine x 2)     Status: None   Collection Time: 11/07/18  7:38 AM   Specimen: BLOOD LEFT FOREARM  Result Value Ref Range Status   Specimen Description   Final    BLOOD LEFT FOREARM BOTTLES DRAWN AEROBIC AND ANAEROBIC   Special Requests Blood Culture adequate volume  Final   Culture   Final    NO GROWTH 5 DAYS Performed at [facility], [address], [place], [place] [address]    Report Status 11/12/2018 FINAL  Final  Surgical PCR screen     Status: None   Collection Time: 11/19/18  8:40 PM   Specimen: Nasal Mucosa; Nasal Swab  Result Value Ref Range Status   MRSA, PCR NEGATIVE NEGATIVE Final   Staphylococcus aureus NEGATIVE NEGATIVE Final    Comment: (NOTE) The Xpert SA Assay (FDA approved for NASAL specimens in patients 66 years of age and older), is one component of a comprehensive surveillance program. It is not intended to diagnose infection nor to guide or monitor treatment. Performed at [facility], [place] [address] [place], [place] [address]   SARS Coronavirus 2 (CEPHEID - Performed in [place] hospital lab), Hosp Order     Status: None   Collection Time: 11/27/18  1:57 PM   Specimen: Nasopharyngeal Swab  Result Value Ref Range Status   SARS Coronavirus 2 NEGATIVE NEGATIVE Final    Comment: (NOTE) If result is NEGATIVE SARS-CoV-2 target nucleic acids are NOT DETECTED. The SARS-CoV-2 RNA is generally detectable in upper and lower  respiratory specimens during the acute phase of infection. The lowest  concentration of SARS-CoV-2 viral copies this assay can detect is 250  copies / mL. A negative result does not preclude SARS-CoV-2 infection  and should not be used as the sole basis for treatment or other  patient management decisions.  A negative result may occur with  improper specimen collection / handling, submission of specimen other  than nasopharyngeal swab, presence of  viral mutation(s) within the  areas targeted by this assay, and inadequate number of viral copies  (<250 copies / mL). A negative result must be combined with clinical  observations, patient history, and epidemiological information. If result is POSITIVE SARS-CoV-2 target nucleic acids are DETECTED. The SARS-CoV-2 RNA is generally detectable in upper and lower  respiratory specimens dur ing the acute phase of infection.  Positive  results are indicative of active infection with SARS-CoV-2.  Clinical  correlation with patient history and other diagnostic information is  necessary to determine patient infection status.  Positive results do  not rule out bacterial  infection or co-infection with other viruses. If result is PRESUMPTIVE POSTIVE SARS-CoV-2 nucleic acids MAY BE PRESENT.   A presumptive positive result was obtained on the submitted specimen  and confirmed on repeat testing.  While 2019 novel coronavirus  (SARS-CoV-2) nucleic acids may be present in the submitted sample  additional confirmatory testing may be necessary for epidemiological  and / or clinical management purposes  to differentiate between  SARS-CoV-2 and other Sarbecovirus currently known to infect humans.  If clinically indicated additional testing with an alternate test  methodology [phone]) is advised. The SARS-CoV-2 RNA is generally  detectable in upper and lower respiratory sp ecimens during the acute  phase of infection. The expected result is Negative. Fact Sheet for Patients:  StrictlyIdeas.no Fact Sheet for Healthcare Providers: BankingDealers.co.za This test is not yet approved or cleared by the [place] FDA and has been authorized for detection and/or diagnosis of SARS-CoV-2 by FDA under an Emergency Use Authorization (EUA).  This EUA will remain in effect (meaning this test can be used) for the duration of the COVID-19 declaration under Section  564(b)(1) of the Act, 21 U.S.C. section 360bbb-3(b)(1), unless the authorization is terminated or revoked sooner. Performed at [facility], [place] [address] [place], [place] [address]     Coagulation Studies: Recent Labs    11/30/18 0244  LABPROT 14.7  INR 1.2    Urinalysis: No results for input(s): COLORURINE, LABSPEC, PHURINE, GLUCOSEU, HGBUR, BILIRUBINUR, KETONESUR, PROTEINUR, UROBILINOGEN, NITRITE, LEUKOCYTESUR in the last 72 hours.  Invalid input(s): APPERANCEUR    Imaging: No results found.   Medications:   . sodium chloride    . lactated ringers     . amiodarone  200 mg Oral Daily  . amLODipine  10 mg Oral Daily  . aspirin EC  81 mg Oral Daily  . bisacodyl  10 mg Oral Daily   Or  . bisacodyl  10 mg Rectal Daily  . calcium acetate  667 mg Oral TID WC  . Chlorhexidine Gluconate Cloth  6 each Topical Q0600  . Chlorhexidine Gluconate Cloth  6 each Topical Q0600  . ciprofloxacin-dexamethasone  4 drop Left EAR BID  . darbepoetin (ARANESP) injection - NON-DIALYSIS  100 mcg Subcutaneous Q Mon-1800  . docusate sodium  200 mg Oral Daily  . febuxostat  40 mg Oral Daily  . gabapentin  600 mg Oral QHS  . hydrALAZINE  25 mg Oral BID  . insulin aspart  0-5 Units Subcutaneous QHS  . insulin aspart  0-9 Units Subcutaneous TID WC  . insulin detemir  5 Units Subcutaneous QHS  . pantoprazole  40 mg Oral Daily  . sodium chloride flush  10-40 mL Intracatheter Q12H  . sodium chloride flush  3 mL Intravenous Q12H  . thyroid  120 mg Oral Q0600  . Warfarin - Physician Dosing Inpatient   Does not apply q1800   acetaminophen, metoprolol tartrate, ondansetron (ZOFRAN) IV, phenol, promethazine, sodium chloride flush, sodium chloride flush  Assessment/ Plan:   End-stage renal disease Tuesday Thursday dialysis clipped to [facility] unit.  Will continue on schedule TTS.   Marland Kitchen  She is off schedule due to high dialysis volumes and did not receive dialysis 11/25/2018.  She is  clipped to [facility] unit TTS.  She had some hypotension she felt after her dialysis treatment 11/29/2018 only 1 L of fluid was removed.  She did develop atrial fibrillation and I am curious if this contributed to her lightheadedness.  Her next dialysis  treatment be on Tuesday, 12/02/2018  Anemia hemoglobin 8.2 low iron saturations 11/22/2018 with a transfusion 1 pack blood cells 11/22/2018.  We will continue to follow she is receiving darbepoetin  Bones PTH 114 calcium phosphorus continue to follow phosphorus slightly elevated continues on binders.    Nutrition pro-stat low albumin 2.2  Hypertension controlled added hydralazine 25 mg twice daily  Diabetes as per primary team glucose appears controlled at this point  Status post CABG appreciate input and help from cardiovascular team  Coronary artery disease continues aspirin no statin due to intolerance  Hypothyroidism on replacement therapy  Diastolic heart failure continues on amiodarone.  Hyperlipidemia intolerance of statins defer to primary team  Disposition not quite ready for discharge  Atrial fibrillation being anticoagulated with Coumadin rate control with amiodarone appreciate the assistance of Dr. Kirk Ruths.    LOS: [place] @TODAY @7 :58 AM

## 2018-11-30 NOTE — Progress Notes (Addendum)
[name]       [place] [address]             [id number]      10 Days Post-Op Procedure(s) (LRB): CORONARY ARTERY BYPASS GRAFTING (CABG), ON PUMP, TIMES four, USING LEFT INTERNAL MAMMARY ARTERY AND ENDOSCOPICALLY HARVESTED RIGHT GREATER SAPHENOUS VEIN (N/A) TRANSESOPHAGEAL ECHOCARDIOGRAM (TEE) (N/A) Subjective: Feels a little better this morning. She slept well last night.   Objective: Vital signs in last 24 hours: Temp:  [97.5 F (36.4 C)-98.8 F (37.1 C)] 98.8 F (37.1 C) (07/04 2351) Pulse Rate:  [77-88] 82 (07/04 2351) Cardiac Rhythm: Normal sinus rhythm;Bundle branch block (07/04 2053) Resp:  [15-22] 17 (07/04 2351) BP: (96-149)/(42-72) 127/45 (07/04 2351) SpO2:  [92 %-100 %] 92 % (07/05 9485) Weight:  [74.6 kg-75.6 kg] 74.6 kg (07/04 1355)     Intake/Output from previous day: 07/04 0701 - 07/05 0700 In: 177 [P.O.:177] Out: 1000  Intake/Output this shift: No intake/output data recorded.  General appearance: alert, cooperative and no distress Heart: regular rate and rhythm, S1, S2 normal, no murmur, click, rub or gallop Lungs: clear to auscultation bilaterally Abdomen: soft, non-tender; bowel sounds normal; no masses,  no organomegaly Extremities: extremities normal, atraumatic, no cyanosis or edema Wound: clean and dry  Lab Results: Recent Labs    11/29/18 1040  WBC 6.9  HGB 9.1*  HCT 29.6*  PLT 331   BMET:  Recent Labs    11/28/18 0446 11/29/18 1157  NA 135 138  K 3.8 3.6  CL 98 100  CO2 25 25  GLUCOSE 101* 188*  BUN 45* 38*  CREATININE 5.45* 5.33*  CALCIUM 8.6* 8.8*    PT/INR:  Recent Labs    11/30/18 0244  LABPROT 14.7  INR 1.2   ABG    Component Value Date/Time   PHART 7.343 (L) 11/20/2018 2328   HCO3 24.6 11/20/2018 2328   TCO2 26 11/20/2018 2328   ACIDBASEDEF 1.0 11/20/2018 2328   O2SAT 100.0 11/20/2018 2328   CBG (last 3)  Recent Labs    11/29/18 1850 11/29/18 2054 11/30/18 0632  GLUCAP  157* 150* 104*    Assessment/Plan: S/P Procedure(s) (LRB): CORONARY ARTERY BYPASS GRAFTING (CABG), ON PUMP, TIMES four, USING LEFT INTERNAL MAMMARY ARTERY AND ENDOSCOPICALLY HARVESTED RIGHT GREATER SAPHENOUS VEIN (N/A) TRANSESOPHAGEAL ECHOCARDIOGRAM (TEE) (N/A)    1. CV-Events from yesterday afternoon noted. Afib on 7/3 noted. Converted shortly after with daily dose of 200mg  Amio and Lopressor 2.5x1. Continue Norvasc 10mg  and Hydralazine 25mg  BID. She is in NSR in the 80s this morning. Coumadin initiated last night with an INR of 1.2 this morning.  2. Pulm- tolerating room air with good oxygen saturation. Continue to encourage incentive spirometer use.  3. Anemia of chronic disease-9.1/29.6, has remains stable 4. CKD stage III- Nephrology following. HD should be T.TH.Sat. HD per nephrology 5. DM- 157, 150, 104-continue current regimen 6. Hypothyroidism-continue Armour 120 mg daily 7. Debility-PT tried to work with the patient yesterday and she became dizzy and diaphoretic. She was escorted back to bed. PT is recommending CIR for severe debility.   Plan: The patient was suppose to discharge yesterday after dialysis, however patient decompensated and discharge was held. She may need a higher level of care like CIR vs. SNF. Will discuss with Dr. Roxan Hockey.     LOS: 23 days    [name] 11/30/2018  Got a little dizzy walking with PT yesterday after HD, c/w orthostatic hypotension. Just  back from a walk. Did fine, no issues. Wants to go home Will dc home  [name]. Roxan Hockey, MD [facility] [phone]

## 2018-11-30 NOTE — Progress Notes (Addendum)
Physical Therapy Treatment Patient Details Name: [name] MRN: [id number] DOB: [birth year] Today's Date: 11/30/2018    History of Present Illness 78 year old WF PMHx  DM 2 controlled with complication, HTN, CKD stage 5, CAD s/p STEMI treated medically due to advanced CKD and anemia, gout, chronic combined systolic and diastolic CHF, Hypothyroid, Admitted 11/06/2018 with diagnosis of NSTEMI, acute on chronic diastolic CHF, progressive CKD to ESRD. Nephrology consulted and HD initiated this admission. Cardiology consulted, cardiac cath 6/17 showed severe three-vessel disease, TCTS consulted and plan CABG after Plavix washout (last dose 11/12/2018). Patient for CABG on 11/20/2018. Pt with episode of Afib 11/28/2018.     PT Comments    Pt admitted with above diagnosis. Pt currently with functional limitations due to balance and endurance deficits. Pt was able to ambulate with RW in hallway as well as ascended and descended steps with PT.  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  HHPT can address vestibular issues.  Pt's partner will be with her at all times per pt.  Son is also trying to arrange some extra help per pt.  Will follow acutely.  Pt will benefit from skilled PT to increase their independence and safety with mobility to allow discharge to the venue listed below.     Follow Up Recommendations  Home health PT vestibular rehab;Supervision/Assistance - 24 hour     Equipment Recommendations  None recommended by PT    Recommendations for Other Services None     Precautions / Restrictions Precautions Precautions: Sternal Precaution Booklet Issued: No Restrictions Weight Bearing Restrictions: Yes(sternal precautions)    Mobility  Bed Mobility               General bed mobility comments: sitting EoB on entry  Transfers Overall transfer level: Needs assistance Equipment used: Rolling walker (2 wheeled) Transfers: Sit  to/from Stand Sit to Stand: Min guard         General transfer comment: min guard A and cues for hand placement on knees for power up and steadying  Ambulation/Gait Ambulation/Gait assistance: Min guard Gait Distance (Feet): 200 Feet Assistive device: Rolling walker (2 wheeled) Gait Pattern/deviations: Step-through pattern;Decreased step length - left;Decreased step length - right;Shuffle Gait velocity: slowed Gait velocity interpretation: <1.31 ft/sec, indicative of household ambulator General Gait Details: did well with RW with good cadence with ambulation. Safe with RW and will have assist at home per pt.    Stairs Stairs: Yes Stairs assistance: Supervision;Min guard Stair Management: One rail Left;Sideways;Step to pattern Number of Stairs: 4 General stair comments: No assist needed with pt performing with PT just guarding/supervision   Wheelchair Mobility    Modified Rankin (Stroke Patients Only)       Balance Overall balance assessment: Needs assistance Sitting-balance support: Feet supported;No upper extremity supported Sitting balance-Leahy Scale: Fair     Standing balance support: Bilateral upper extremity supported;During functional activity Standing balance-Leahy Scale: Poor Standing balance comment: relies on UE support for balance                            Cognition Arousal/Alertness: Awake/alert Behavior During Therapy: WFL for tasks assessed/performed Overall Cognitive Status: Within Functional Limits for tasks assessed  Exercises Other Exercises Other Exercises: x1 exercises  Other Exercises: Access Code: [email or web address] - Vestibular exercise program reviewed and emailed to pt.    General Comments General comments (skin integrity, edema, etc.): Tested canals to r/o BPPV.  Pt with negative testing all canals.  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  Inititated x 1 exercises.         Pertinent Vitals/Pain Pain Assessment: No/denies pain    Home Living                      Prior Function            PT Goals (current goals can now be found in the care plan section) Acute Rehab PT Goals Patient Stated Goal: go home Progress towards PT goals: Progressing toward goals    Frequency    Min 3X/week      PT Plan Discharge plan needs to be updated    Co-evaluation              AM-PAC PT "6 Clicks" Mobility   Outcome Measure  Help needed turning from your back to your side while in a flat bed without using bedrails?: None Help needed moving from lying on your back to sitting on the side of a flat bed without using bedrails?: A Little Help needed moving to and from a bed to a chair (including a wheelchair)?: A Little Help needed standing up from a chair using your arms (e.g., wheelchair or bedside chair)?: A Little Help needed to walk in hospital room?: A Little Help needed climbing 3-5 steps with a railing? : A Little 6 Click Score: 19    End of Session Equipment Utilized During Treatment: Gait belt Activity Tolerance: Patient tolerated treatment well Patient left: in bed;with call bell/phone within reach;with bed alarm set Nurse Communication: Mobility status PT Visit Diagnosis: Unsteadiness on feet (R26.81);Other abnormalities of gait and mobility (R26.89);Muscle weakness (generalized) (M62.81);Difficulty in walking, not elsewhere classified (R26.2);Dizziness and giddiness (R42)     Time: 0263-7858 PT Time Calculation (min) (ACUTE ONLY): 34 min  Charges:  $Gait Training: 8-22 mins $Therapeutic Exercise: 8-22 mins                     [place] Pager:  [phone]  Office:  [place] 11/30/2018, 10:19 AM

## 2018-12-02 DIAGNOSIS — E785 Hyperlipidemia, unspecified: Secondary | ICD-10-CM | POA: Diagnosis not present

## 2018-12-02 DIAGNOSIS — E119 Type 2 diabetes mellitus without complications: Secondary | ICD-10-CM | POA: Diagnosis not present

## 2018-12-02 DIAGNOSIS — Z23 Encounter for immunization: Secondary | ICD-10-CM | POA: Diagnosis not present

## 2018-12-02 DIAGNOSIS — N186 End stage renal disease: Secondary | ICD-10-CM | POA: Diagnosis not present

## 2018-12-02 DIAGNOSIS — Z1159 Encounter for screening for other viral diseases: Secondary | ICD-10-CM | POA: Diagnosis not present

## 2018-12-02 DIAGNOSIS — Z992 Dependence on renal dialysis: Secondary | ICD-10-CM | POA: Diagnosis not present

## 2018-12-03 ENCOUNTER — Ambulatory Visit (INDEPENDENT_AMBULATORY_CARE_PROVIDER_SITE_OTHER): Payer: Medicare HMO | Admitting: *Deleted

## 2018-12-03 DIAGNOSIS — Z48812 Encounter for surgical aftercare following surgery on the circulatory system: Secondary | ICD-10-CM | POA: Diagnosis not present

## 2018-12-03 DIAGNOSIS — I132 Hypertensive heart and chronic kidney disease with heart failure and with stage 5 chronic kidney disease, or end stage renal disease: Secondary | ICD-10-CM | POA: Diagnosis not present

## 2018-12-03 DIAGNOSIS — Z452 Encounter for adjustment and management of vascular access device: Secondary | ICD-10-CM | POA: Insufficient documentation

## 2018-12-03 DIAGNOSIS — I0981 Rheumatic heart failure: Secondary | ICD-10-CM | POA: Diagnosis not present

## 2018-12-03 DIAGNOSIS — I480 Paroxysmal atrial fibrillation: Secondary | ICD-10-CM

## 2018-12-03 DIAGNOSIS — I2510 Atherosclerotic heart disease of native coronary artery without angina pectoris: Secondary | ICD-10-CM | POA: Diagnosis not present

## 2018-12-03 DIAGNOSIS — E1122 Type 2 diabetes mellitus with diabetic chronic kidney disease: Secondary | ICD-10-CM | POA: Diagnosis not present

## 2018-12-03 DIAGNOSIS — I5033 Acute on chronic diastolic (congestive) heart failure: Secondary | ICD-10-CM | POA: Diagnosis not present

## 2018-12-03 DIAGNOSIS — Z5181 Encounter for therapeutic drug level monitoring: Secondary | ICD-10-CM | POA: Diagnosis not present

## 2018-12-03 DIAGNOSIS — N186 End stage renal disease: Secondary | ICD-10-CM | POA: Diagnosis not present

## 2018-12-03 DIAGNOSIS — I4891 Unspecified atrial fibrillation: Secondary | ICD-10-CM | POA: Insufficient documentation

## 2018-12-03 DIAGNOSIS — I214 Non-ST elevation (NSTEMI) myocardial infarction: Secondary | ICD-10-CM | POA: Diagnosis not present

## 2018-12-03 DIAGNOSIS — I5022 Chronic systolic (congestive) heart failure: Secondary | ICD-10-CM | POA: Diagnosis not present

## 2018-12-03 LAB — POCT INR: INR: 1.2 — AB (ref 2.0–3.0)

## 2018-12-03 NOTE — Patient Instructions (Signed)
Been taking 5mg  daily except 2.5mg  on M,W,F since d/c from hospital Increase coumadin to 5mg  daily.  Recheck in 1 week. On Amiodarone 200mg  daily

## 2018-12-04 DIAGNOSIS — N186 End stage renal disease: Secondary | ICD-10-CM | POA: Diagnosis not present

## 2018-12-04 DIAGNOSIS — Z23 Encounter for immunization: Secondary | ICD-10-CM | POA: Diagnosis not present

## 2018-12-04 DIAGNOSIS — Z992 Dependence on renal dialysis: Secondary | ICD-10-CM | POA: Diagnosis not present

## 2018-12-05 DIAGNOSIS — I5022 Chronic systolic (congestive) heart failure: Secondary | ICD-10-CM | POA: Diagnosis not present

## 2018-12-05 DIAGNOSIS — I2510 Atherosclerotic heart disease of native coronary artery without angina pectoris: Secondary | ICD-10-CM | POA: Diagnosis not present

## 2018-12-05 DIAGNOSIS — Z48812 Encounter for surgical aftercare following surgery on the circulatory system: Secondary | ICD-10-CM | POA: Diagnosis not present

## 2018-12-05 DIAGNOSIS — E1122 Type 2 diabetes mellitus with diabetic chronic kidney disease: Secondary | ICD-10-CM | POA: Diagnosis not present

## 2018-12-05 DIAGNOSIS — I5033 Acute on chronic diastolic (congestive) heart failure: Secondary | ICD-10-CM | POA: Diagnosis not present

## 2018-12-05 DIAGNOSIS — N186 End stage renal disease: Secondary | ICD-10-CM | POA: Diagnosis not present

## 2018-12-05 DIAGNOSIS — I214 Non-ST elevation (NSTEMI) myocardial infarction: Secondary | ICD-10-CM | POA: Diagnosis not present

## 2018-12-05 DIAGNOSIS — I132 Hypertensive heart and chronic kidney disease with heart failure and with stage 5 chronic kidney disease, or end stage renal disease: Secondary | ICD-10-CM | POA: Diagnosis not present

## 2018-12-05 DIAGNOSIS — I0981 Rheumatic heart failure: Secondary | ICD-10-CM | POA: Diagnosis not present

## 2018-12-06 DIAGNOSIS — Z23 Encounter for immunization: Secondary | ICD-10-CM | POA: Diagnosis not present

## 2018-12-06 DIAGNOSIS — Z992 Dependence on renal dialysis: Secondary | ICD-10-CM | POA: Diagnosis not present

## 2018-12-06 DIAGNOSIS — N186 End stage renal disease: Secondary | ICD-10-CM | POA: Diagnosis not present

## 2018-12-08 ENCOUNTER — Ambulatory Visit: Payer: Medicare HMO | Admitting: Internal Medicine

## 2018-12-08 DIAGNOSIS — E1122 Type 2 diabetes mellitus with diabetic chronic kidney disease: Secondary | ICD-10-CM | POA: Diagnosis not present

## 2018-12-08 DIAGNOSIS — I214 Non-ST elevation (NSTEMI) myocardial infarction: Secondary | ICD-10-CM | POA: Diagnosis not present

## 2018-12-08 DIAGNOSIS — I132 Hypertensive heart and chronic kidney disease with heart failure and with stage 5 chronic kidney disease, or end stage renal disease: Secondary | ICD-10-CM | POA: Diagnosis not present

## 2018-12-08 DIAGNOSIS — Z48812 Encounter for surgical aftercare following surgery on the circulatory system: Secondary | ICD-10-CM | POA: Diagnosis not present

## 2018-12-08 DIAGNOSIS — N186 End stage renal disease: Secondary | ICD-10-CM | POA: Diagnosis not present

## 2018-12-08 DIAGNOSIS — I0981 Rheumatic heart failure: Secondary | ICD-10-CM | POA: Diagnosis not present

## 2018-12-08 DIAGNOSIS — I5033 Acute on chronic diastolic (congestive) heart failure: Secondary | ICD-10-CM | POA: Diagnosis not present

## 2018-12-08 DIAGNOSIS — I2510 Atherosclerotic heart disease of native coronary artery without angina pectoris: Secondary | ICD-10-CM | POA: Diagnosis not present

## 2018-12-08 DIAGNOSIS — I5022 Chronic systolic (congestive) heart failure: Secondary | ICD-10-CM | POA: Diagnosis not present

## 2018-12-09 ENCOUNTER — Telehealth: Payer: Self-pay | Admitting: Cardiology

## 2018-12-09 DIAGNOSIS — Z23 Encounter for immunization: Secondary | ICD-10-CM | POA: Diagnosis not present

## 2018-12-09 DIAGNOSIS — N186 End stage renal disease: Secondary | ICD-10-CM | POA: Diagnosis not present

## 2018-12-09 DIAGNOSIS — Z992 Dependence on renal dialysis: Secondary | ICD-10-CM | POA: Diagnosis not present

## 2018-12-09 NOTE — Telephone Encounter (Signed)
New message   Patient needs an order for oxygen and is having problems with b/p is low. Per caretaker oxygen level is low. Please call.

## 2018-12-09 NOTE — Telephone Encounter (Signed)
Spoke with caregiver Andres Shad and he is requesting an order for oxygen d/t O2 Sats ranging between 79-91%. Says BP has been low also ranging from BP 110/45-133/50. Reported BS 136 this morning. Per [name], patient missed her  appointment yesterday because she was too sick to go and he was unable to wake her up. Denies, SOB, chest pain. Reports dizziness when standing up. Says home health nurse suggest he call cardiologist to request order for oxygen. [facility] is providing home health services since d/c. Advised that he needed to contact patient's PCP for oxygen orders. [name] says he didn't understand why the cardiologist couldn't order oxygen. Explained that typically, home health and oxygen orders are done by PCP. Verbalized understanding but was frustrated that our office couldn't order oxygen.

## 2018-12-10 ENCOUNTER — Telehealth: Payer: Self-pay | Admitting: *Deleted

## 2018-12-10 ENCOUNTER — Ambulatory Visit (INDEPENDENT_AMBULATORY_CARE_PROVIDER_SITE_OTHER): Payer: Medicare HMO | Admitting: *Deleted

## 2018-12-10 DIAGNOSIS — Z5181 Encounter for therapeutic drug level monitoring: Secondary | ICD-10-CM | POA: Diagnosis not present

## 2018-12-10 DIAGNOSIS — Z48812 Encounter for surgical aftercare following surgery on the circulatory system: Secondary | ICD-10-CM | POA: Diagnosis not present

## 2018-12-10 DIAGNOSIS — I214 Non-ST elevation (NSTEMI) myocardial infarction: Secondary | ICD-10-CM | POA: Diagnosis not present

## 2018-12-10 DIAGNOSIS — I0981 Rheumatic heart failure: Secondary | ICD-10-CM | POA: Diagnosis not present

## 2018-12-10 DIAGNOSIS — I480 Paroxysmal atrial fibrillation: Secondary | ICD-10-CM

## 2018-12-10 DIAGNOSIS — E1122 Type 2 diabetes mellitus with diabetic chronic kidney disease: Secondary | ICD-10-CM | POA: Diagnosis not present

## 2018-12-10 DIAGNOSIS — N186 End stage renal disease: Secondary | ICD-10-CM | POA: Diagnosis not present

## 2018-12-10 DIAGNOSIS — I2510 Atherosclerotic heart disease of native coronary artery without angina pectoris: Secondary | ICD-10-CM | POA: Diagnosis not present

## 2018-12-10 DIAGNOSIS — I5033 Acute on chronic diastolic (congestive) heart failure: Secondary | ICD-10-CM | POA: Diagnosis not present

## 2018-12-10 DIAGNOSIS — I132 Hypertensive heart and chronic kidney disease with heart failure and with stage 5 chronic kidney disease, or end stage renal disease: Secondary | ICD-10-CM | POA: Diagnosis not present

## 2018-12-10 DIAGNOSIS — I5022 Chronic systolic (congestive) heart failure: Secondary | ICD-10-CM | POA: Diagnosis not present

## 2018-12-10 LAB — POCT INR: INR: 1.3 — AB (ref 2.0–3.0)

## 2018-12-10 NOTE — Telephone Encounter (Signed)
Spoke with Thayer Jew.  Order given for pt's INR to be checked on 12/17/18 with results to me.  Phone number provided.

## 2018-12-10 NOTE — Telephone Encounter (Signed)
Patient asking for order for Home health nurse to check inr at home when she comes

## 2018-12-10 NOTE — Patient Instructions (Signed)
Has been on warfarin 5mg  daily x 1 week Denies missing any doses Increase dose to 1 1/2 tablets (7.5mg ) daily except 1 tablet (5mg ) on Mondays and Fridays  Recheck in 1 week by [facility].  Inez Catalina RN notified. On Amiodarone 200mg  daily

## 2018-12-11 DIAGNOSIS — E1122 Type 2 diabetes mellitus with diabetic chronic kidney disease: Secondary | ICD-10-CM | POA: Diagnosis not present

## 2018-12-11 DIAGNOSIS — I0981 Rheumatic heart failure: Secondary | ICD-10-CM | POA: Diagnosis not present

## 2018-12-11 DIAGNOSIS — Z48812 Encounter for surgical aftercare following surgery on the circulatory system: Secondary | ICD-10-CM | POA: Diagnosis not present

## 2018-12-11 DIAGNOSIS — I214 Non-ST elevation (NSTEMI) myocardial infarction: Secondary | ICD-10-CM | POA: Diagnosis not present

## 2018-12-11 DIAGNOSIS — I5033 Acute on chronic diastolic (congestive) heart failure: Secondary | ICD-10-CM | POA: Diagnosis not present

## 2018-12-11 DIAGNOSIS — N186 End stage renal disease: Secondary | ICD-10-CM | POA: Diagnosis not present

## 2018-12-11 DIAGNOSIS — Z992 Dependence on renal dialysis: Secondary | ICD-10-CM | POA: Diagnosis not present

## 2018-12-11 DIAGNOSIS — Z23 Encounter for immunization: Secondary | ICD-10-CM | POA: Diagnosis not present

## 2018-12-11 DIAGNOSIS — I132 Hypertensive heart and chronic kidney disease with heart failure and with stage 5 chronic kidney disease, or end stage renal disease: Secondary | ICD-10-CM | POA: Diagnosis not present

## 2018-12-11 DIAGNOSIS — I2510 Atherosclerotic heart disease of native coronary artery without angina pectoris: Secondary | ICD-10-CM | POA: Diagnosis not present

## 2018-12-11 DIAGNOSIS — I5022 Chronic systolic (congestive) heart failure: Secondary | ICD-10-CM | POA: Diagnosis not present

## 2018-12-12 DIAGNOSIS — I5022 Chronic systolic (congestive) heart failure: Secondary | ICD-10-CM | POA: Diagnosis not present

## 2018-12-12 DIAGNOSIS — I132 Hypertensive heart and chronic kidney disease with heart failure and with stage 5 chronic kidney disease, or end stage renal disease: Secondary | ICD-10-CM | POA: Diagnosis not present

## 2018-12-12 DIAGNOSIS — I214 Non-ST elevation (NSTEMI) myocardial infarction: Secondary | ICD-10-CM | POA: Diagnosis not present

## 2018-12-12 DIAGNOSIS — I0981 Rheumatic heart failure: Secondary | ICD-10-CM | POA: Diagnosis not present

## 2018-12-12 DIAGNOSIS — Z48812 Encounter for surgical aftercare following surgery on the circulatory system: Secondary | ICD-10-CM | POA: Diagnosis not present

## 2018-12-12 DIAGNOSIS — I5033 Acute on chronic diastolic (congestive) heart failure: Secondary | ICD-10-CM | POA: Diagnosis not present

## 2018-12-12 DIAGNOSIS — E1122 Type 2 diabetes mellitus with diabetic chronic kidney disease: Secondary | ICD-10-CM | POA: Diagnosis not present

## 2018-12-12 DIAGNOSIS — N186 End stage renal disease: Secondary | ICD-10-CM | POA: Diagnosis not present

## 2018-12-12 DIAGNOSIS — I2510 Atherosclerotic heart disease of native coronary artery without angina pectoris: Secondary | ICD-10-CM | POA: Diagnosis not present

## 2018-12-13 DIAGNOSIS — N186 End stage renal disease: Secondary | ICD-10-CM | POA: Diagnosis not present

## 2018-12-13 DIAGNOSIS — Z992 Dependence on renal dialysis: Secondary | ICD-10-CM | POA: Diagnosis not present

## 2018-12-13 DIAGNOSIS — Z23 Encounter for immunization: Secondary | ICD-10-CM | POA: Diagnosis not present

## 2018-12-15 ENCOUNTER — Telehealth: Payer: Self-pay

## 2018-12-15 ENCOUNTER — Telehealth (HOSPITAL_COMMUNITY): Payer: Self-pay | Admitting: *Deleted

## 2018-12-15 DIAGNOSIS — I2510 Atherosclerotic heart disease of native coronary artery without angina pectoris: Secondary | ICD-10-CM | POA: Diagnosis not present

## 2018-12-15 DIAGNOSIS — I214 Non-ST elevation (NSTEMI) myocardial infarction: Secondary | ICD-10-CM | POA: Diagnosis not present

## 2018-12-15 DIAGNOSIS — I132 Hypertensive heart and chronic kidney disease with heart failure and with stage 5 chronic kidney disease, or end stage renal disease: Secondary | ICD-10-CM | POA: Diagnosis not present

## 2018-12-15 DIAGNOSIS — Z48812 Encounter for surgical aftercare following surgery on the circulatory system: Secondary | ICD-10-CM | POA: Diagnosis not present

## 2018-12-15 NOTE — Telephone Encounter (Signed)
Patient contacted the office requesting to reschedule her post op CABG 11/20/2018 follow-up appointment 12/23/2018 with Dr. Roxan Hockey.  She stated that she has dialysis Tues, Thurs, Sat and would not be able to make it.  Patient rescheduled for 12/22/2018 with a PA to make sure patient was seen as soon as possible after being discharged from the hospital.  Dr. Roxan Hockey made aware.  Patient will follow-up with Dr. Roxan Hockey at a later date.  Patient is aware to get chest xray before coming to her appointment.

## 2018-12-15 NOTE — Telephone Encounter (Signed)
-----   Message from Melrose Nakayama, MD sent at 12/15/2018 12:33 PM EDT ----- [facility] ----- Message ----- From: Donnella Sham, RN Sent: 12/15/2018  10:46 AM EDT To: Melrose Nakayama, MD  Hey,  I had to reschedule patient due to having dialysis same day as her appointment.  Put her on with the PAs to get her seen as soon as possible (she is a follow-up CABG 11/20/18).  Just wanted to make you aware.  Thanks,  Caryl Pina

## 2018-12-16 ENCOUNTER — Ambulatory Visit: Payer: Medicare HMO | Admitting: Student

## 2018-12-16 ENCOUNTER — Encounter (HOSPITAL_COMMUNITY): Payer: Self-pay | Admitting: *Deleted

## 2018-12-16 DIAGNOSIS — Z23 Encounter for immunization: Secondary | ICD-10-CM | POA: Diagnosis not present

## 2018-12-16 DIAGNOSIS — N186 End stage renal disease: Secondary | ICD-10-CM | POA: Diagnosis not present

## 2018-12-16 DIAGNOSIS — Z992 Dependence on renal dialysis: Secondary | ICD-10-CM | POA: Diagnosis not present

## 2018-12-16 NOTE — Telephone Encounter (Signed)
I spoke with [name], patient's husband, and he reports that patient is receiving epogen at dialysis.  I have cancelled her appointments for injection but she will still come for labs and doctor's visit.

## 2018-12-16 NOTE — Progress Notes (Signed)
I spoke with Letitia Caul, RN at [facility] in [place], [place] and confirmed that patient is receiving ESA injections there.  Horris Latino states that patient is receiving epo 4000 units three times weekly with dialysis.  She reports that patient started there on 7/2.  I have cancelled patient's upcoming injection appointments here.

## 2018-12-17 ENCOUNTER — Ambulatory Visit (INDEPENDENT_AMBULATORY_CARE_PROVIDER_SITE_OTHER): Payer: Medicare HMO | Admitting: Student

## 2018-12-17 ENCOUNTER — Telehealth: Payer: Self-pay | Admitting: *Deleted

## 2018-12-17 ENCOUNTER — Ambulatory Visit (INDEPENDENT_AMBULATORY_CARE_PROVIDER_SITE_OTHER): Payer: Medicare HMO | Admitting: *Deleted

## 2018-12-17 ENCOUNTER — Other Ambulatory Visit: Payer: Self-pay

## 2018-12-17 ENCOUNTER — Encounter: Payer: Self-pay | Admitting: Student

## 2018-12-17 VITALS — BP 120/64 | HR 81 | Ht 64.0 in | Wt 167.0 lb

## 2018-12-17 DIAGNOSIS — I480 Paroxysmal atrial fibrillation: Secondary | ICD-10-CM | POA: Diagnosis not present

## 2018-12-17 DIAGNOSIS — I10 Essential (primary) hypertension: Secondary | ICD-10-CM

## 2018-12-17 DIAGNOSIS — N186 End stage renal disease: Secondary | ICD-10-CM | POA: Diagnosis not present

## 2018-12-17 DIAGNOSIS — E782 Mixed hyperlipidemia: Secondary | ICD-10-CM | POA: Diagnosis not present

## 2018-12-17 DIAGNOSIS — Z951 Presence of aortocoronary bypass graft: Secondary | ICD-10-CM | POA: Diagnosis not present

## 2018-12-17 DIAGNOSIS — I2510 Atherosclerotic heart disease of native coronary artery without angina pectoris: Secondary | ICD-10-CM

## 2018-12-17 DIAGNOSIS — Z992 Dependence on renal dialysis: Secondary | ICD-10-CM

## 2018-12-17 DIAGNOSIS — I4891 Unspecified atrial fibrillation: Secondary | ICD-10-CM | POA: Diagnosis not present

## 2018-12-17 DIAGNOSIS — I9789 Other postprocedural complications and disorders of the circulatory system, not elsewhere classified: Secondary | ICD-10-CM

## 2018-12-17 DIAGNOSIS — Z5181 Encounter for therapeutic drug level monitoring: Secondary | ICD-10-CM

## 2018-12-17 LAB — POCT INR: INR: 1.6 — AB (ref 2.0–3.0)

## 2018-12-17 NOTE — Patient Instructions (Signed)
Increase coumadin to 1 1/2 tablets (7.5mg ) daily Recheck in 1 week by [name].  Called Betty RN and orders given. On Amiodarone 200mg  daily

## 2018-12-17 NOTE — Telephone Encounter (Signed)
Home health nurse can not come by to check her and they are wanting to know if she can come to our office to have checked

## 2018-12-17 NOTE — Progress Notes (Signed)
Cardiology Office Note    Date:  12/17/2018   ID:  [name], DOB 1940-07-04, MRN [id number]  PCP:  Doree Albee, MD  Cardiologist: Carlyle Dolly, MD    Chief Complaint  Patient presents with   Hospitalization Follow-up    s/p CABG    History of Present Illness:    [name] is a 78 y.o. female with past medical history of CAD (s/p prior NSTEMI's and previously medically-managed given Stage 5 CKD and anemia), HTN, HLD, Type 2 DM, and Hypothyroidism who presents to the office today for hospital follow-up.   She initially presented to [facility] ED on 11/06/2018 for evaluation of chest pain and was found to have an NSTEMI with troponin values elevated to greater than 4. She required initiation of HD during admission and once her volume status improved she underwent a cardiac catheterization on 11/12/2018. This showed severe 3-vessel CAD and CT Surgery was consulted for consideration of CABG. She had been on Plavix which delayed her surgery but this was performed by Dr. Roxan Hockey on 11/20/2018 with LIMA-LAD, Seq SVG-OM1-OM2, and SVG-PDA. She did experience post-operative atrial fibrillation and was started on Amiodarone. She maintained NSR following initiation of this until 11/28/2018 when she had a recurrent event. Given that she was 9 days post-op, she was started on Coumadin for anticoagulation with consideration of discontinuing in 3 to 4 months if no recurrence. Was eventually discharged home on 11/30/2018 with Amiodarone 200mg  daily, Amlodipine 10mg  daily, ASA 81mg  daily, Hydralazine 25mg  BID, Toprol-XL 12.5mg  daily, and Coumadin.   She was suppose to follow-up with Dr. Debara Pickett in the [facility] given her intolerance to statin therapy but canceled her appointment for 7/13. Did have a follow-up INR check on 7/15 and this was subtherapeutic at 1.3.  In talking with the patient today, she reports overall doing very well since her prolonged hospitalization. She reports she  was initially feeling fatigued following her hemodialysis sessions but this has continued to improve. She was experiencing hypotension during dialysis and was instructed by her Nephrologist to hold her AM blood pressure medications and Hydralazine was also discontinued. She denies any exertional chest pain or dyspnea on exertion. Her sternal discomfort continues to improve and she ins only taking Tylenol as needed. No recent orthopnea, PND, or lower extremity edema. She has been working with [facility] multiple times per week in an effort to improve her strength and balance.   Past Medical History:  Diagnosis Date   Anemia    Arthritis    Blood transfusion without reported diagnosis    CAD (coronary artery disease)    a. 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   Cataract    Chronic anemia    Chronic combined systolic and diastolic CHF (congestive heart failure) ([place])    a. 2D echo 08/2016 at [facility]: EF 50-55% with inferior wall HK, impaired LV filling, fair study.   CKD (chronic kidney disease), stage III (HCC)    Diabetes (HCC)    Gastritis    GERD (gastroesophageal reflux disease)    Gout    Headache    HTN (hypertension)    Hyperlipidemia    Hypothyroidism    Iron deficiency anemia 11/08/2016   Normocytic anemia 10/26/2016   NSTEMI (non-ST elevated myocardial infarction) ([place])    a. Complex admission 08/2016 - with severe hyperglycemia, AKI on CKD, severe anemia down to Hgb 6.8, acute combined CHF, troponin of 8.5, cath deferred due  to renal dysfunction.   Thyroid disease    Wears dentures     Past Surgical History:  Procedure Laterality Date   BASCILIC VEIN TRANSPOSITION Right 06/12/2018   Procedure: FIRST STAGE BASCILIC VEIN TRANSPOSITION RIGHT ARM;  Surgeon: Rosetta Posner, MD;  Location: [facility] OR;  Service: Vascular;  Laterality: Right;   [place] Right 08/07/2018   Procedure: BASCILIC VEIN TRANSPOSITION SECOND  STAGE RIGHT ARM;  Surgeon: Rosetta Posner, MD;  Location: [place];  Service: Vascular;  Laterality: Right;   CATARACT EXTRACTION     left   COLONOSCOPY WITH PROPOFOL N/A 10/08/2016   5 mm transverse colon polyp note resected due to plavix. hemorrhoids   CORONARY ARTERY BYPASS GRAFT N/A 11/20/2018   Procedure: CORONARY ARTERY BYPASS GRAFTING (CABG), ON PUMP, TIMES four, USING LEFT INTERNAL MAMMARY ARTERY AND ENDOSCOPICALLY HARVESTED RIGHT GREATER SAPHENOUS VEIN;  Surgeon: Melrose Nakayama, MD;  Location: [place];  Service: Open Heart Surgery;  Laterality: N/A;   ENTEROSCOPY N/A 08/08/2017   Procedure: ENTEROSCOPY;  Surgeon: Daneil Dolin, MD;  [name];  Service: Endoscopy;  Laterality: N/A;   ESOPHAGOGASTRODUODENOSCOPY N/A 10/06/2016   mild chroni gastritis, negative H.pylori   ESOPHAGOGASTRODUODENOSCOPY (EGD) WITH PROPOFOL N/A 03/12/2017   mild edema/erythema of stomach, small bowel biopsy with focal villous tip lymphocytosis, ?partially developed celiac   ESOPHAGOGASTRODUODENOSCOPY (EGD) WITH PROPOFOL N/A 08/08/2017   normal esophagus, small hiatal hernia, normal duodenal bulb, abnormal small bowel junction of duodenum and jejunum lwith active bleeding 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, s/p clips and lesion tattooed   GIVENS CAPSULE STUDY  10/08/2016   normal   GIVENS CAPSULE STUDY N/A 10/29/2016   occasional gastric erosion, unremarkable small bowel   LEFT HEART CATH AND CORONARY ANGIOGRAPHY N/A 11/12/2018   Procedure: LEFT HEART CATH AND CORONARY ANGIOGRAPHY;  Surgeon: Burnell Blanks, MD;  Location: [facility];  Service: Cardiovascular;  Laterality: N/A;   ORIF HUMERUS FRACTURE Left 02/07/2015   Procedure: OPEN REDUCTION INTERNAL FIXATION (ORIF) PROXIMAL HUMERUS FRACTURE;  Surgeon: Marybelle Killings, MD;  Location: [place];  Service: Orthopedics;  Laterality: Left;   TEE WITHOUT CARDIOVERSION N/A 11/20/2018   Procedure: TRANSESOPHAGEAL ECHOCARDIOGRAM (TEE);   Surgeon: Melrose Nakayama, MD;  Location: [facility];  Service: Open Heart Surgery;  Laterality: N/A;   teeth extractions     THYROID SURGERY     TOTAL HIP ARTHROPLASTY Left 02/07/2015   Procedure: TOTAL HIP ARTHROPLASTY ANTERIOR APPROACH ;  Surgeon: Marybelle Killings, MD;  Location: [place];  Service: Orthopedics;  Laterality: Left;   WRIST SURGERY Left     Current Medications: Outpatient Medications Prior to Visit  Medication Sig Dispense Refill   acetaminophen (TYLENOL) 325 MG tablet Take 2 tablets (650 mg total) by mouth every 6 (six) hours as needed for mild pain or headache.     Alcohol Swabs (B-D SINGLE USE SWABS REGULAR) PADS      amiodarone (PACERONE) 200 MG tablet Take 1 tablet (200 mg total) by mouth daily. 30 tablet 1   amLODipine (NORVASC) 10 MG tablet Take 1 tablet (10 mg total) by mouth daily. 90 tablet 3   Ascorbic Acid (VITAMIN C) 100 MG tablet Take 1 tablet (100 mg total) by mouth daily.     aspirin EC 81 MG EC tablet Take 1 tablet (81 mg total) by mouth daily.     BD PEN NEEDLE MICRO U/F 32G X 6 MM MISC      calcium acetate (  PHOSLO) 667 MG capsule Take 667 mg by mouth 3 (three) times daily with meals.     Cholecalciferol (VITAMIN D-3) 125 MCG (5000 UT) TABS Take 5,000 mg by mouth 3 (three) times a week.      docusate sodium (COLACE) 100 MG capsule Take 1 capsule (100 mg total) by mouth daily.     gabapentin (NEURONTIN) 300 MG capsule Take 2 capsules (600 mg total) by mouth at bedtime. 180 capsule 0   glucose blood test strip 1 each by Other route 4 (four) times daily. Use as instructed bid. E11.65 400 each 2   Insulin Glargine (TOUJEO SOLOSTAR [place]) Inject 5 Units into the skin at bedtime.     metoprolol succinate (TOPROL XL) 25 MG 24 hr tablet Take 0.5 tablets (12.5 mg total) by mouth daily. 30 tablet 11   Multiple Vitamin (MULTIVITAMIN WITH MINERALS) TABS tablet Take 1 tablet by mouth daily.     NP THYROID 120 MG tablet Take 120 mg by mouth daily.      pantoprazole (PROTONIX) 40 MG tablet Take 1 tablet (40 mg total) by mouth 2 (two) times daily. 30 minutes before breakfast (Patient taking differently: Take 40 mg by mouth daily before breakfast. ) 180 tablet 3   sodium bicarbonate 650 MG tablet Take 650 mg by mouth 2 (two) times daily.     traMADol (ULTRAM) 50 MG tablet Take 1 tablet (50 mg total) by mouth every 12 (twelve) hours as needed for moderate pain. 24 tablet 0   ULORIC 40 MG tablet Take 40 mg by mouth daily.   3   warfarin (COUMADIN) 2.5 MG tablet Take 2 tablets (5 mg total) by mouth every Tuesday, Thursday, Saturday, and Sunday at 6 PM AND 1 tablet (2.5 mg total) every Monday, Wednesday, and Friday at 6 PM. Do all this for 21 days. 33 tablet 0   hydrALAZINE (APRESOLINE) 25 MG tablet Take 1 tablet (25 mg total) by mouth 2 (two) times daily. 60 tablet 1   No facility-administered medications prior to visit.      Allergies:   Insulin glargine and Statins   Social History   Socioeconomic History   Marital status: Widowed    Spouse name: [name]   Number of children: 2   Years of education: 12   Highest education level: Not on file  Occupational History   Occupation: retired  Scientist, product/process development strain: Not on file   Food insecurity    Worry: Not on file    Inability: Not on Lexicographer needs    Medical: Not on file    Non-medical: Not on file  Tobacco Use   Smoking status: Never Smoker   Smokeless tobacco: Never Used  Substance and Sexual Activity   Alcohol use: No    Alcohol/week: 0.0 standard drinks   Drug use: No   Sexual activity: Not Currently  Lifestyle   Physical activity    Days per week: Not on file    Minutes per session: Not on file   Stress: Not on file  Relationships   Social connections    Talks on phone: Not on file    Gets together: Not on file    Attends religious service: Not on file    Active member of club or organization: Not on file    Attends  meetings of clubs or organizations: Not on file    Relationship status: Not on file  Other Topics Concern   Not  on file  Social History Narrative   Lives with significant other/partner [name]   He is her caregiver   Moved from [facility]      Her Son Tanda Morrissey (lives In [place]) -- ph # [phone] ( would like to be called about care issues)     Family History:  The patient's family history includes Asthma in her mother; Diabetes in her mother; Early death in her father; Early death (age of onset: 54) in her mother.   Review of Systems:   Please see the history of present illness.     General:  No chills, fever, night sweats or weight changes.  Cardiovascular:  No dyspnea on exertion, edema, orthopnea, palpitations, paroxysmal nocturnal dyspnea. Positive for sternal pain (improving).  Dermatological: No rash, lesions/masses Respiratory: No cough, dyspnea Urologic: No hematuria, dysuria Abdominal:   No nausea, vomiting, diarrhea, bright red blood per rectum, melena, or hematemesis Neurologic:  No visual changes, wkns, changes in mental status. All other systems reviewed and are otherwise negative except as noted above.   Physical Exam:    VS:  BP 120/64 (BP Location: Left Arm)    Pulse 81    Ht 5\' 4"  (1.626 m)    Wt 167 lb (75.8 kg)    SpO2 94%    BMI 28.67 kg/m    General: Well developed, well nourished,female appearing in no acute distress. Head: Normocephalic, atraumatic, sclera non-icteric, no xanthomas, nares are without discharge.  Neck: No carotid bruits. JVD not elevated.  Lungs: Respirations regular and unlabored, without wheezes or rales.  Heart: Regular rate and rhythm. No S3 or S4.  No murmur, no rubs, or gallops appreciated. Sternal incision appears well-healing with no significant erythema or drainage present.  Abdomen: Soft, non-tender, non-distended with normoactive bowel sounds. No hepatomegaly. No rebound/guarding. No obvious abdominal masses. Msk:  Strength and tone  appear normal for age. No joint deformities or effusions. Extremities: No clubbing or cyanosis. No edema.  Distal pedal pulses are 2+ bilaterally. Vein graft harvest sites without erythema or drainage.  Neuro: Alert and oriented X 3. Moves all extremities spontaneously. No focal deficits noted. Psych:  Responds to questions appropriately with a normal affect. Skin: No rashes or lesions noted  Wt Readings from Last 3 Encounters:  12/17/18 167 lb (75.8 kg)  11/29/18 164 lb 7.4 oz (74.6 kg)  10/28/18 175 lb 11.2 oz (79.7 kg)     Studies/Labs Reviewed:   EKG:  EKG is ordered today.  The ekg ordered today demonstrates significant baseline artifact but appears most consistent with sinus rhythm by review of the rhythm strip and V5 and V6. RBBB present. No acute ST changes.   Recent Labs: 09/30/2018: ALT 22 11/06/2018: TSH 0.326 11/26/2018: Magnesium 2.2 11/29/2018: BUN 38; Creatinine, Ser 5.33; Hemoglobin 9.1; Platelets 331; Potassium 3.6; Sodium 138   Lipid Panel    Component Value Date/Time   CHOL 336 (H) 11/20/2018 0558   TRIG 373 (H) 11/20/2018 0558   HDL 40 (L) 11/20/2018 0558   CHOLHDL 8.4 11/20/2018 0558   VLDL 75 (H) 11/20/2018 0558   LDLCALC 221 (H) 11/20/2018 0558    Additional studies/ records that were reviewed today include:   Echocardiogram: 11/10/2018 IMPRESSIONS    1. The left ventricle has normal systolic function, with an ejection fraction of 55-60%. The cavity size was normal. There is moderately increased left ventricular wall thickness. Left ventricular diastolic Doppler parameters are consistent with  impaired relaxation. Elevated mean left atrial pressure.  2. The  right ventricle has normal systolic function. The cavity was normal. There is no increase in right ventricular wall thickness.  3. Left atrial size was moderately dilated.  4. Right atrial size was mildly dilated.  5. The aortic valve is tricuspid. Moderate thickening of the aortic valve. Moderate  calcification of the aortic valve. No stenosis of the aortic valve. Moderate aortic annular calcification noted.  6. The mitral valve is abnormal. Moderate thickening of the mitral valve leaflet. Moderate calcification of the mitral valve leaflet. There is moderate mitral annular calcification present. No evidence of mitral valve stenosis.  7. The aortic root is normal in size and structure.  8. Pulmonary hypertension is mildly elevated, PASP is 34 mmHg.   Cardiac Catheterization: 11/12/2018  Prox RCA lesion is 50% stenosed.  Mid RCA to Dist RCA lesion is 80% stenosed.  Dist RCA lesion is 100% stenosed.  Prox Cx lesion is 99% stenosed.  Mid Cx lesion is 99% stenosed.  Dist Cx lesion is 90% stenosed.  1st Mrg lesion is 90% stenosed.  Ost LAD to Prox LAD lesion is 70% stenosed.  Mid LAD lesion is 30% stenosed.   1. 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 Hold Plavix.   Assessment:    1. Coronary artery disease involving native coronary artery of native heart without angina pectoris   2. S/P CABG x 4   3. Postoperative atrial fibrillation (HCC)   4. Essential hypertension   5. Mixed hyperlipidemia   6. End-stage renal disease on hemodialysis ([place])      Plan:   In order of problems listed above:  1. CAD - she is s/p CABG on 11/20/2018 with LIMA-LAD, Seq SVG-OM1-OM2, and SVG-PDA. She has overall progressed well and denies any recurrent episodes of exertional chest pain. Her sternal pain continues to improve and her incisions appear well-healing by examination today.   - continue current medication regimen with Amlodipine 10mg  daily, ASA 81mg  daily, and Toprol-XL 12.5mg  daily. While her presentation was consistent with an NSTEMI she was not started on Plavix given the indication for anticoagulation. Will make sure a  referral to Cardiac Rehab has been entered.   2. Postoperative Atrial Fibrillation - She experienced multiple episodes during her recent admission and was started on Coumadin for anticoagulation as she had an episode 9 days postop. She is due for an INR check and will message Edrick Oh to follow-up on the dosing today as INR is subtherapeutic at 1.6. The plan was to continue on Coumadin for 3-4 months then discontinue if no recurrent episodes.  - She denies any recent palpitations. EKG today does have significant baseline artifact but appears most consistent with sinus rhythm. Remains on Amiodarone 200 mg daily and Toprol-XL 12.5 mg daily.  3. HTN - BP is well controlled at 120/64 during today's visit. Continue Amlodipine 10 mg daily and Toprol-XL 12.5 mg daily. If hypotension continues to be an issue during hemodialysis, would recommend reducing Amlodipine to 5 mg daily.  4. HLD - Lipid panel during recent admission showed total cholesterol 336, triglycerides 373, HDL 40, and LDL 221. She reports previously being intolerant to Lipitor, Crestor, Zetia, and Niacin. Had previously been referred to Dr. Debara Pickett but missed her appointment at that time. She lives in [place] and reports traveling to [place] is quite challenging, especially given her schedule with hemodialysis.  Will reach out to the [facility] today to see if we can start the process for PCSK-9 inhibitor therapy. This was reviewed with the patient today and she is in agreement to starting this if approved by her insurance.  5. ESRD - now on HD - Tuesday, Thursday, Saturday. Followed by Dr. Lowanda Foster.   Medication Adjustments/Labs and Tests Ordered: Current medicines are reviewed at length with the patient today.  Concerns regarding medicines are outlined above.  Medication changes, Labs and Tests ordered today are listed in the Patient Instructions below. Patient Instructions  Medication Instructions:  Your physician has recommended  you make the following change in your medication:  Take Coumadin 1 1/2 Tablets Daily .  Have Home Health Nurse check INR next week on Tuesday or Wednesday   If you need a refill on your cardiac medications before your next appointment, please call your pharmacy.   Lab work: NONE  If you have labs (blood work) drawn today and your tests are completely normal, you will receive your results only by:  [place] (if you have MyChart) OR  A paper copy in the mail If you have any lab test that is abnormal or we need to change your treatment, we will call you to review the results.  Testing/Procedures: NONE   Follow-Up: At [facility], you and your health needs are our priority.  As part of our continuing mission to provide you with exceptional heart care, we have created designated Provider Care Teams.  These Care Teams include your primary Cardiologist (physician) and Advanced Practice Providers (APPs -  Physician Assistants and Nurse Practitioners) who all work together to provide you with the care you need, when you need it. You will need a follow up appointment in 3 months.  Please call our office 2 months in advance to schedule this appointment.  You may see Carlyle Dolly, MD or one of the following Advanced Practice Providers on your designated Care Team:   Bernerd Pho, PA-C ([place])  Ermalinda Barrios, PA-C [facility])  Any Other Special Instructions Will Be Listed Below (If Applicable). You have been referred to the lipid clinic.   Thank you for choosing [facility]!     Signed, Erma Heritage, PA-C  12/17/2018 5:24 PM    [place] [address] [place], [place] [address] Phone: [phone] Fax: [phone]

## 2018-12-17 NOTE — Patient Instructions (Signed)
Medication Instructions:  Your physician has recommended you make the following change in your medication:  Take Coumadin 1 1/2 Tablets Daily .  Have Home Health Nurse check INR next week on Tuesday or Wednesday   If you need a refill on your cardiac medications before your next appointment, please call your pharmacy.   Lab work: NONE  If you have labs (blood work) drawn today and your tests are completely normal, you will receive your results only by: Marland Kitchen MyChart Message (if you have MyChart) OR . A paper copy in the mail If you have any lab test that is abnormal or we need to change your treatment, we will call you to review the results.  Testing/Procedures: NONE   Follow-Up: At [facility], you and your health needs are our priority.  As part of our continuing mission to provide you with exceptional heart care, we have created designated Provider Care Teams.  These Care Teams include your primary Cardiologist (physician) and Advanced Practice Providers (APPs -  Physician Assistants and Nurse Practitioners) who all work together to provide you with the care you need, when you need it. You will need a follow up appointment in 3 months.  Please call our office 2 months in advance to schedule this appointment.  You may see Carlyle Dolly, MD or one of the following Advanced Practice Providers on your designated Care Team:   Bernerd Pho, PA-C [facility]) . Ermalinda Barrios, PA-C ([facility])  Any Other Special Instructions Will Be Listed Below (If Applicable). You have been referred to the lipid clinic.   Thank you for choosing [facility]!

## 2018-12-17 NOTE — Telephone Encounter (Signed)
[place] office to check INR at pt's appt and call to me.  Husband told.

## 2018-12-18 DIAGNOSIS — Z992 Dependence on renal dialysis: Secondary | ICD-10-CM | POA: Diagnosis not present

## 2018-12-18 DIAGNOSIS — N186 End stage renal disease: Secondary | ICD-10-CM | POA: Diagnosis not present

## 2018-12-18 DIAGNOSIS — Z23 Encounter for immunization: Secondary | ICD-10-CM | POA: Diagnosis not present

## 2018-12-19 ENCOUNTER — Other Ambulatory Visit: Payer: Self-pay | Admitting: Thoracic Surgery (Cardiothoracic Vascular Surgery)

## 2018-12-19 DIAGNOSIS — I5033 Acute on chronic diastolic (congestive) heart failure: Secondary | ICD-10-CM | POA: Diagnosis not present

## 2018-12-19 DIAGNOSIS — I0981 Rheumatic heart failure: Secondary | ICD-10-CM | POA: Diagnosis not present

## 2018-12-19 DIAGNOSIS — N186 End stage renal disease: Secondary | ICD-10-CM | POA: Diagnosis not present

## 2018-12-19 DIAGNOSIS — I132 Hypertensive heart and chronic kidney disease with heart failure and with stage 5 chronic kidney disease, or end stage renal disease: Secondary | ICD-10-CM | POA: Diagnosis not present

## 2018-12-19 DIAGNOSIS — I214 Non-ST elevation (NSTEMI) myocardial infarction: Secondary | ICD-10-CM | POA: Diagnosis not present

## 2018-12-19 DIAGNOSIS — I5022 Chronic systolic (congestive) heart failure: Secondary | ICD-10-CM | POA: Diagnosis not present

## 2018-12-19 DIAGNOSIS — Z48812 Encounter for surgical aftercare following surgery on the circulatory system: Secondary | ICD-10-CM | POA: Diagnosis not present

## 2018-12-19 DIAGNOSIS — Z951 Presence of aortocoronary bypass graft: Secondary | ICD-10-CM

## 2018-12-19 DIAGNOSIS — E1122 Type 2 diabetes mellitus with diabetic chronic kidney disease: Secondary | ICD-10-CM | POA: Diagnosis not present

## 2018-12-19 DIAGNOSIS — I2510 Atherosclerotic heart disease of native coronary artery without angina pectoris: Secondary | ICD-10-CM | POA: Diagnosis not present

## 2018-12-20 DIAGNOSIS — Z992 Dependence on renal dialysis: Secondary | ICD-10-CM | POA: Diagnosis not present

## 2018-12-20 DIAGNOSIS — N186 End stage renal disease: Secondary | ICD-10-CM | POA: Diagnosis not present

## 2018-12-20 DIAGNOSIS — Z23 Encounter for immunization: Secondary | ICD-10-CM | POA: Diagnosis not present

## 2018-12-22 ENCOUNTER — Other Ambulatory Visit: Payer: Self-pay

## 2018-12-22 ENCOUNTER — Ambulatory Visit (INDEPENDENT_AMBULATORY_CARE_PROVIDER_SITE_OTHER): Payer: Self-pay | Admitting: Surgical

## 2018-12-22 ENCOUNTER — Ambulatory Visit
Admission: RE | Admit: 2018-12-22 | Discharge: 2018-12-22 | Disposition: A | Discharge status: Home / Self Care | Payer: Medicare HMO | Source: Ambulatory Visit | Attending: Thoracic Surgery (Cardiothoracic Vascular Surgery) | Admitting: Thoracic Surgery (Cardiothoracic Vascular Surgery)

## 2018-12-22 VITALS — BP 150/61 | HR 79 | Temp 97.4°F | Resp 18 | Ht 64.0 in | Wt 167.0 lb

## 2018-12-22 DIAGNOSIS — J90 Pleural effusion, not elsewhere classified: Secondary | ICD-10-CM | POA: Diagnosis not present

## 2018-12-22 DIAGNOSIS — Z951 Presence of aortocoronary bypass graft: Secondary | ICD-10-CM

## 2018-12-22 DIAGNOSIS — I2510 Atherosclerotic heart disease of native coronary artery without angina pectoris: Secondary | ICD-10-CM

## 2018-12-22 DIAGNOSIS — J9811 Atelectasis: Secondary | ICD-10-CM | POA: Diagnosis not present

## 2018-12-22 NOTE — Assessment & Plan Note (Signed)
Excellent early post op course

## 2018-12-22 NOTE — Patient Instructions (Signed)
discussrd activity progression

## 2018-12-22 NOTE — Progress Notes (Signed)
[address]       [place] [address]             [phone]      Salem D Bosak [place] Medical Record #[id number] Date of Birth: 1940/06/16  Referring: Arnoldo Lenis, MD Primary Care: Doree Albee, MD Primary Cardiologist: Carlyle Dolly, MD   Chief Complaint:   POST OP FOLLOW UP PHYSICIAN:STEVEN Chaya Jan, MD  OPERATIVE REPORT  DATE OF PROCEDURE:  11/20/2018  PREOPERATIVE DIAGNOSES:  Three-vessel coronary artery disease.  POSTOPERATIVE DIAGNOSIS:  Three-vessel coronary artery disease.  PROCEDURE:  Median sternotomy, extracorporeal circulation Coronary artery bypass grafting x 4      Left internal mammary artery to left anterior descending,      Sequential saphenous vein graft to obtuse marginals 1 and 2,      Saphenous vein graft to posterior  Descending Endoscopic vein harvest, right leg.  SURGEON:  Modesto Charon, MD  ASSISTANT:  Jadene Pierini, PA-C  ANESTHESIA:  General.  FINDINGS:  Transesophageal echocardiography showed mild mitral regurgitation, ejection fraction of approximately 50%.  Good quality conduits.  OM1 poor quality target.  Remaining targets, good quality.  History of Present Illness:    Patient is a 78 year old female dialysis patient status post the above described procedure seen in the office on routine postsurgical follow-up visit.  She reports that she feels quite well.  She denies fevers, chills or other significant constitutional symptoms.  She is doing well with her dialysis treatments.  Her energy and stamina levels are both increasing.  She has had no difficulties with her incisions.  She denies chest pain or shortness of breath.  She is does have some sternal burning type sensation occasionally.  She has some lower extremity edema that waxes and wanes to a degree with dialysis treatments.  She is not having any palpitations.  She has been seen by cardiology and they are also adjusting  Coumadin dosing for postoperative atrial fibrillation.  Overall she is quite pleased with her progress.    Past Medical History:  Diagnosis Date  . Anemia   . Arthritis   . Blood transfusion without reported diagnosis   . CAD (coronary artery disease)    a. 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  . Cataract   . Chronic anemia   . Chronic combined systolic and diastolic CHF (congestive heart failure) ([place])    a. 2D echo 08/2016 at [facility]: EF 50-55% with inferior wall HK, impaired LV filling, fair study.  . CKD (chronic kidney disease), stage III ([place])   . Diabetes ([place])   . Gastritis   . GERD (gastroesophageal reflux disease)   . Gout   . Headache   . HTN (hypertension)   . Hyperlipidemia   . Hypothyroidism   . Iron deficiency anemia 11/08/2016  . Normocytic anemia 10/26/2016  . NSTEMI (non-ST elevated myocardial infarction) ([place])    a. Complex admission 08/2016 - with severe hyperglycemia, AKI on CKD, severe anemia down to Hgb 6.8, acute combined CHF, troponin of 8.5, cath deferred due to renal dysfunction.  . Thyroid disease   . Wears dentures      Social History   Tobacco Use  Smoking Status Never Smoker  Smokeless Tobacco Never Used    Social History   Substance and Sexual Activity  Alcohol Use No  . Alcohol/week: 0.0 standard drinks     Allergies  Allergen Reactions  . Insulin Glargine Swelling    "  Makes me swell like a balloon all over", including face, but without any respiratory distress or rashes. Associated with weight gain.  . Statins Other (See Comments)    "I've tried them all; my muscle aches were so bad I couldn't walk".    Current Outpatient Medications  Medication Sig Dispense Refill  . acetaminophen (TYLENOL) 325 MG tablet Take 2 tablets (650 mg total) by mouth every 6 (six) hours as needed for mild pain or headache.    . Alcohol Swabs (B-D SINGLE USE SWABS REGULAR) PADS     . amiodarone (PACERONE) 200 MG tablet Take 1  tablet (200 mg total) by mouth daily. 30 tablet 1  . amLODipine (NORVASC) 10 MG tablet Take 1 tablet (10 mg total) by mouth daily. 90 tablet 3  . Ascorbic Acid (VITAMIN C) 100 MG tablet Take 1 tablet (100 mg total) by mouth daily.    Marland Kitchen aspirin EC 81 MG EC tablet Take 1 tablet (81 mg total) by mouth daily.    . BD PEN NEEDLE MICRO U/F 32G X 6 MM MISC     . calcium acetate (PHOSLO) 667 MG capsule Take 667 mg by mouth 3 (three) times daily with meals.    . Cholecalciferol (VITAMIN D-3) 125 MCG (5000 UT) TABS Take 5,000 mg by mouth 3 (three) times a week.     . docusate sodium (COLACE) 100 MG capsule Take 1 capsule (100 mg total) by mouth daily.    Marland Kitchen gabapentin (NEURONTIN) 300 MG capsule Take 2 capsules (600 mg total) by mouth at bedtime. 180 capsule 0  . glucose blood test strip 1 each by Other route 4 (four) times daily. Use as instructed bid. E11.65 400 each 2  . Insulin Glargine (TOUJEO SOLOSTAR [place]) Inject 5 Units into the skin at bedtime.    . metoprolol succinate (TOPROL XL) 25 MG 24 hr tablet Take 0.5 tablets (12.5 mg total) by mouth daily. 30 tablet 11  . Multiple Vitamin (MULTIVITAMIN WITH MINERALS) TABS tablet Take 1 tablet by mouth daily.    . NP THYROID 120 MG tablet Take 120 mg by mouth daily.    . pantoprazole (PROTONIX) 40 MG tablet Take 1 tablet (40 mg total) by mouth 2 (two) times daily. 30 minutes before breakfast (Patient taking differently: Take 40 mg by mouth daily before breakfast. ) 180 tablet 3  . sodium bicarbonate 650 MG tablet Take 650 mg by mouth 2 (two) times daily.    . traMADol (ULTRAM) 50 MG tablet Take 1 tablet (50 mg total) by mouth every 12 (twelve) hours as needed for moderate pain. 24 tablet 0  . ULORIC 40 MG tablet Take 40 mg by mouth daily.   3  . warfarin (COUMADIN) 2.5 MG tablet Take 2 tablets (5 mg total) by mouth every Tuesday, Thursday, Saturday, and Sunday at 6 PM AND 1 tablet (2.5 mg total) every Monday, Wednesday, and Friday at 6 PM. Do all this for 21  days. 33 tablet 0   No current facility-administered medications for this visit.        Physical Exam: BP (!) 150/61 (BP Location: Left Arm, Patient Position: Sitting, Cuff Size: Normal)   Pulse 79   Temp (!) 97.4 F (36.3 C)   Resp 18   Ht 5\' 4"  (1.626 m)   Wt 75.8 kg   SpO2 93% Comment: RA  BMI 28.67 kg/m   General appearance: alert, cooperative and no distress Heart: regular rate and rhythm Lungs: mildly dim left base  Abdomen: benign Extremities: minor ankle edema Wound: incis healing well without signs of infection   Diagnostic Studies & Laboratory data:     Recent Radiology Findings:   No results found.    Recent Lab Findings: Lab Results  Component Value Date   WBC 6.9 11/29/2018   HGB 9.1 (L) 11/29/2018   HCT 29.6 (L) 11/29/2018   PLT 331 11/29/2018   GLUCOSE 188 (H) 11/29/2018   CHOL 336 (H) 11/20/2018   TRIG 373 (H) 11/20/2018   HDL 40 (L) 11/20/2018   LDLCALC 221 (H) 11/20/2018   ALT 22 09/30/2018   AST 33 09/30/2018   NA 138 11/29/2018   K 3.6 11/29/2018   CL 100 11/29/2018   CREATININE 5.33 (H) 11/29/2018   BUN 38 (H) 11/29/2018   CO2 25 11/29/2018   TSH 0.326 (L) 11/06/2018   INR 1.6 (A) 12/17/2018   HGBA1C 5.2 11/20/2018      Assessment / Plan: The patient is doing quite well in her postsurgical recovery.  She does have a small left-sided effusion on chest x-ray.  She had some fluid on that side during hospitalization and thoracentesis was felt not indicated on ultrasound as there only was a small amount of fluid.  There is some degree of atelectasis in this area.  I encouraged her to continue to use her incentive spirometry.  We will repeat a chest x-ray in 2 weeks as she may have developed enough fluid to require thoracentesis at that time.  She will continue on her 3 times weekly dialysis.  She will continue ongoing follow-up with cardiology.  I did not make any changes to her current medication regimen.  We discussed increased activity  progression as able including driving.  She is not taking any narcotic medication at this time.      Medication Changes: No orders of the defined types were placed in this encounter.     John Giovanni, PA-C 12/22/2018 3:07 PM

## 2018-12-23 ENCOUNTER — Ambulatory Visit: Payer: Medicare HMO | Admitting: Thoracic Surgery (Cardiothoracic Vascular Surgery)

## 2018-12-23 DIAGNOSIS — Z23 Encounter for immunization: Secondary | ICD-10-CM | POA: Diagnosis not present

## 2018-12-23 DIAGNOSIS — Z992 Dependence on renal dialysis: Secondary | ICD-10-CM | POA: Diagnosis not present

## 2018-12-23 DIAGNOSIS — N186 End stage renal disease: Secondary | ICD-10-CM | POA: Diagnosis not present

## 2018-12-24 ENCOUNTER — Ambulatory Visit (HOSPITAL_COMMUNITY): Payer: Medicare HMO | Admitting: Hematology

## 2018-12-24 ENCOUNTER — Telehealth: Payer: Self-pay | Admitting: *Deleted

## 2018-12-24 ENCOUNTER — Ambulatory Visit (INDEPENDENT_AMBULATORY_CARE_PROVIDER_SITE_OTHER): Payer: Medicare HMO | Admitting: *Deleted

## 2018-12-24 DIAGNOSIS — I0981 Rheumatic heart failure: Secondary | ICD-10-CM | POA: Diagnosis not present

## 2018-12-24 DIAGNOSIS — Z5181 Encounter for therapeutic drug level monitoring: Secondary | ICD-10-CM

## 2018-12-24 DIAGNOSIS — Z48812 Encounter for surgical aftercare following surgery on the circulatory system: Secondary | ICD-10-CM | POA: Diagnosis not present

## 2018-12-24 DIAGNOSIS — I4891 Unspecified atrial fibrillation: Secondary | ICD-10-CM

## 2018-12-24 DIAGNOSIS — N186 End stage renal disease: Secondary | ICD-10-CM | POA: Diagnosis not present

## 2018-12-24 DIAGNOSIS — I5022 Chronic systolic (congestive) heart failure: Secondary | ICD-10-CM | POA: Diagnosis not present

## 2018-12-24 DIAGNOSIS — I5033 Acute on chronic diastolic (congestive) heart failure: Secondary | ICD-10-CM | POA: Diagnosis not present

## 2018-12-24 DIAGNOSIS — I132 Hypertensive heart and chronic kidney disease with heart failure and with stage 5 chronic kidney disease, or end stage renal disease: Secondary | ICD-10-CM | POA: Diagnosis not present

## 2018-12-24 DIAGNOSIS — I214 Non-ST elevation (NSTEMI) myocardial infarction: Secondary | ICD-10-CM | POA: Diagnosis not present

## 2018-12-24 DIAGNOSIS — I2510 Atherosclerotic heart disease of native coronary artery without angina pectoris: Secondary | ICD-10-CM | POA: Diagnosis not present

## 2018-12-24 DIAGNOSIS — E1122 Type 2 diabetes mellitus with diabetic chronic kidney disease: Secondary | ICD-10-CM | POA: Diagnosis not present

## 2018-12-24 LAB — POCT INR: INR: 2.5 (ref 2.0–3.0)

## 2018-12-24 NOTE — Patient Instructions (Signed)
Continue coumadin 1 1/2 tablets (7.5mg ) daily Recheck in 1 week by [facility].  Called Betty RN and orders given. On Amiodarone 200mg  daily

## 2018-12-24 NOTE — Telephone Encounter (Signed)
Done.  See coumadin note. 

## 2018-12-24 NOTE — Telephone Encounter (Signed)
Received telephone call from [facility]  [phone])  INR 2.5

## 2018-12-25 ENCOUNTER — Ambulatory Visit (HOSPITAL_COMMUNITY): Payer: Medicare HMO | Admitting: Hematology

## 2018-12-25 ENCOUNTER — Other Ambulatory Visit (HOSPITAL_COMMUNITY): Payer: Medicare HMO

## 2018-12-25 ENCOUNTER — Ambulatory Visit (HOSPITAL_COMMUNITY): Payer: Medicare HMO

## 2018-12-25 DIAGNOSIS — N186 End stage renal disease: Secondary | ICD-10-CM | POA: Diagnosis not present

## 2018-12-25 DIAGNOSIS — Z23 Encounter for immunization: Secondary | ICD-10-CM | POA: Diagnosis not present

## 2018-12-25 DIAGNOSIS — Z992 Dependence on renal dialysis: Secondary | ICD-10-CM | POA: Diagnosis not present

## 2018-12-26 ENCOUNTER — Telehealth: Payer: Self-pay | Admitting: Pharmacist Clinician (PhC)/ Clinical Pharmacy Specialist

## 2018-12-26 ENCOUNTER — Other Ambulatory Visit: Payer: Self-pay | Admitting: "Endocrinology

## 2018-12-26 DIAGNOSIS — I5022 Chronic systolic (congestive) heart failure: Secondary | ICD-10-CM | POA: Diagnosis not present

## 2018-12-26 DIAGNOSIS — Z992 Dependence on renal dialysis: Secondary | ICD-10-CM | POA: Diagnosis not present

## 2018-12-26 DIAGNOSIS — I5033 Acute on chronic diastolic (congestive) heart failure: Secondary | ICD-10-CM | POA: Diagnosis not present

## 2018-12-26 DIAGNOSIS — N186 End stage renal disease: Secondary | ICD-10-CM | POA: Diagnosis not present

## 2018-12-26 DIAGNOSIS — Z48812 Encounter for surgical aftercare following surgery on the circulatory system: Secondary | ICD-10-CM | POA: Diagnosis not present

## 2018-12-26 DIAGNOSIS — I214 Non-ST elevation (NSTEMI) myocardial infarction: Secondary | ICD-10-CM | POA: Diagnosis not present

## 2018-12-26 DIAGNOSIS — I2510 Atherosclerotic heart disease of native coronary artery without angina pectoris: Secondary | ICD-10-CM | POA: Diagnosis not present

## 2018-12-26 DIAGNOSIS — I0981 Rheumatic heart failure: Secondary | ICD-10-CM | POA: Diagnosis not present

## 2018-12-26 DIAGNOSIS — E1122 Type 2 diabetes mellitus with diabetic chronic kidney disease: Secondary | ICD-10-CM | POA: Diagnosis not present

## 2018-12-26 DIAGNOSIS — I132 Hypertensive heart and chronic kidney disease with heart failure and with stage 5 chronic kidney disease, or end stage renal disease: Secondary | ICD-10-CM | POA: Diagnosis not present

## 2018-12-26 NOTE — Telephone Encounter (Signed)
Pt is seen by Dr. Harl Bowie in [place], has history of NSTEMI with CABG x 4 in June of this year.   She has a familial hyperlipidemia, as evidenced by her fasting LDL of 221.  She has previously tried multiple statin drugs, but unable to take for more than a few days due to severe pains and weakness in her legs.  It has been several years since she took a statin, but she recalls having tried simvastatin, cerivastatin ([place]), atorvastatin and rosuvastatin.

## 2018-12-27 DIAGNOSIS — Z992 Dependence on renal dialysis: Secondary | ICD-10-CM | POA: Diagnosis not present

## 2018-12-27 DIAGNOSIS — Z23 Encounter for immunization: Secondary | ICD-10-CM | POA: Diagnosis not present

## 2018-12-27 DIAGNOSIS — N186 End stage renal disease: Secondary | ICD-10-CM | POA: Diagnosis not present

## 2018-12-29 ENCOUNTER — Encounter (HOSPITAL_COMMUNITY): Payer: Self-pay | Admitting: Hematology

## 2018-12-29 ENCOUNTER — Inpatient Hospital Stay (HOSPITAL_BASED_OUTPATIENT_CLINIC_OR_DEPARTMENT_OTHER): Payer: Medicare HMO | Admitting: Hematology

## 2018-12-29 ENCOUNTER — Inpatient Hospital Stay (HOSPITAL_COMMUNITY): Payer: Medicare HMO | Attending: Hematology

## 2018-12-29 ENCOUNTER — Other Ambulatory Visit: Payer: Self-pay

## 2018-12-29 VITALS — BP 116/42 | HR 70 | Temp 97.5°F | Resp 18 | Wt 167.2 lb

## 2018-12-29 DIAGNOSIS — D649 Anemia, unspecified: Secondary | ICD-10-CM

## 2018-12-29 DIAGNOSIS — D631 Anemia in chronic kidney disease: Secondary | ICD-10-CM | POA: Insufficient documentation

## 2018-12-29 DIAGNOSIS — N184 Chronic kidney disease, stage 4 (severe): Secondary | ICD-10-CM | POA: Diagnosis not present

## 2018-12-29 DIAGNOSIS — I252 Old myocardial infarction: Secondary | ICD-10-CM | POA: Diagnosis not present

## 2018-12-29 DIAGNOSIS — Z79899 Other long term (current) drug therapy: Secondary | ICD-10-CM | POA: Insufficient documentation

## 2018-12-29 DIAGNOSIS — Z951 Presence of aortocoronary bypass graft: Secondary | ICD-10-CM | POA: Insufficient documentation

## 2018-12-29 DIAGNOSIS — I2510 Atherosclerotic heart disease of native coronary artery without angina pectoris: Secondary | ICD-10-CM | POA: Insufficient documentation

## 2018-12-29 DIAGNOSIS — E1122 Type 2 diabetes mellitus with diabetic chronic kidney disease: Secondary | ICD-10-CM | POA: Insufficient documentation

## 2018-12-29 DIAGNOSIS — Z992 Dependence on renal dialysis: Secondary | ICD-10-CM | POA: Insufficient documentation

## 2018-12-29 DIAGNOSIS — N186 End stage renal disease: Secondary | ICD-10-CM | POA: Diagnosis not present

## 2018-12-29 DIAGNOSIS — I132 Hypertensive heart and chronic kidney disease with heart failure and with stage 5 chronic kidney disease, or end stage renal disease: Secondary | ICD-10-CM | POA: Diagnosis not present

## 2018-12-29 LAB — CBC WITH DIFFERENTIAL/PLATELET
Abs Immature Granulocytes: 0.02 10*3/uL (ref 0.00–0.07)
Basophils Absolute: 0.1 10*3/uL (ref 0.0–0.1)
Basophils Relative: 1 %
Eosinophils Absolute: 0.5 10*3/uL (ref 0.0–0.5)
Eosinophils Relative: 9 %
HCT: 31.1 % — ABNORMAL LOW (ref 36.0–46.0)
Hemoglobin: 9.5 g/dL — ABNORMAL LOW (ref 12.0–15.0)
Immature Granulocytes: 0 %
Lymphocytes Relative: 22 %
Lymphs Abs: 1.4 10*3/uL (ref 0.7–4.0)
MCH: 29.1 pg (ref 26.0–34.0)
MCHC: 30.5 g/dL (ref 30.0–36.0)
MCV: 95.1 fL (ref 80.0–100.0)
Monocytes Absolute: 0.6 10*3/uL (ref 0.1–1.0)
Monocytes Relative: 10 %
Neutro Abs: 3.6 10*3/uL (ref 1.7–7.7)
Neutrophils Relative %: 58 %
Platelets: 354 10*3/uL (ref 150–400)
RBC: 3.27 MIL/uL — ABNORMAL LOW (ref 3.87–5.11)
RDW: 15.4 % (ref 11.5–15.5)
WBC: 6.2 10*3/uL (ref 4.0–10.5)
nRBC: 0 % (ref 0.0–0.2)

## 2018-12-29 LAB — COMPREHENSIVE METABOLIC PANEL
ALT: 14 U/L (ref 0–44)
AST: 22 U/L (ref 15–41)
Albumin: 3.3 g/dL — ABNORMAL LOW (ref 3.5–5.0)
Alkaline Phosphatase: 90 U/L (ref 38–126)
Anion gap: 12 (ref 5–15)
BUN: 24 mg/dL — ABNORMAL HIGH (ref 8–23)
CO2: 29 mmol/L (ref 22–32)
Calcium: 9.1 mg/dL (ref 8.9–10.3)
Chloride: 99 mmol/L (ref 98–111)
Creatinine, Ser: 4.02 mg/dL — ABNORMAL HIGH (ref 0.44–1.00)
GFR calc Af Amer: 12 mL/min — ABNORMAL LOW (ref >60)
GFR calc non Af Amer: 10 mL/min — ABNORMAL LOW (ref >60)
Glucose, Bld: 166 mg/dL — ABNORMAL HIGH (ref 70–99)
Potassium: 3.5 mmol/L (ref 3.5–5.1)
Sodium: 140 mmol/L (ref 135–145)
Total Bilirubin: 0.5 mg/dL (ref 0.3–1.2)
Total Protein: 6.3 g/dL — ABNORMAL LOW (ref 6.5–8.1)

## 2018-12-29 NOTE — Progress Notes (Signed)
[address] [place], [place] [address]   CLINIC:  Medical Oncology/Hematology  PCP:  Doree Albee, MD [place] [address] [phone]   REASON FOR VISIT:  Follow-up for Normocytic anemia secondary to CKD  CURRENT THERAPY: Monthly Retacrit    INTERVAL HISTORY:  [name] 78 y.o. female seen for follow-up of anemia.  She was hospitalized and underwent CABG on 11/20/2018.  She was reportedly started on hemodialysis about a month ago.  She undergoes hemodialysis on Tuesday Thursday and Saturday at [facility] in [place].  Her last Retacrit was on 10/29/2018 at our clinic.  Denies any bleeding per rectum or melena.  She is accompanied by her husband today.  Denies any fevers or chills or other infections.   REVIEW OF SYSTEMS:  Review of Systems  HENT:  Negative.   Eyes: Negative.   Respiratory: Negative.   Cardiovascular: Negative.   Gastrointestinal: Negative.   Endocrine: Negative.   Genitourinary: Negative.    Musculoskeletal: Negative.   Skin: Negative.   Neurological: Negative.   Hematological: Negative.   Psychiatric/Behavioral: Negative.   All other systems reviewed and are negative.    PAST MEDICAL/SURGICAL HISTORY:  Past Medical History:  Diagnosis Date  . Anemia   . Arthritis   . Blood transfusion without reported diagnosis   . CAD (coronary artery disease)    a. 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  . Cataract   . Chronic anemia   . Chronic combined systolic and diastolic CHF (congestive heart failure) ([place])    a. 2D echo 08/2016 at [facility]: EF 50-55% with inferior wall HK, impaired LV filling, fair study.  . CKD (chronic kidney disease), stage III ([place])   . Diabetes ([place])   . Gastritis   . GERD (gastroesophageal reflux disease)   . Gout   . Headache   . HTN (hypertension)   . Hyperlipidemia   . Hypothyroidism   . Iron deficiency anemia 11/08/2016  . Normocytic anemia 10/26/2016  .  NSTEMI (non-ST elevated myocardial infarction) ([place])    a. Complex admission 08/2016 - with severe hyperglycemia, AKI on CKD, severe anemia down to Hgb 6.8, acute combined CHF, troponin of 8.5, cath deferred due to renal dysfunction.  . Thyroid disease   . Wears dentures    Past Surgical History:  Procedure Laterality Date  . BASCILIC VEIN TRANSPOSITION Right 06/12/2018   Procedure: FIRST STAGE BASCILIC VEIN TRANSPOSITION RIGHT ARM;  Surgeon: Rosetta Posner, MD;  Location: [facility];  Service: Vascular;  Laterality: Right;  . BASCILIC VEIN TRANSPOSITION Right 08/07/2018   Procedure: BASCILIC VEIN TRANSPOSITION SECOND STAGE RIGHT ARM;  Surgeon: Rosetta Posner, MD;  Location: [facility];  Service: Vascular;  Laterality: Right;  . CATARACT EXTRACTION     left  . COLONOSCOPY WITH PROPOFOL N/A 10/08/2016   5 mm transverse colon polyp note resected due to plavix. hemorrhoids  . CORONARY ARTERY BYPASS GRAFT N/A 11/20/2018   Procedure: CORONARY ARTERY BYPASS GRAFTING (CABG), ON PUMP, TIMES four, USING LEFT INTERNAL MAMMARY ARTERY AND ENDOSCOPICALLY HARVESTED RIGHT GREATER SAPHENOUS VEIN;  Surgeon: Melrose Nakayama, MD;  Location: [facility];  Service: Open Heart Surgery;  Laterality: N/A;  . ENTEROSCOPY N/A 08/08/2017   Procedure: ENTEROSCOPY;  Surgeon: Daneil Dolin, MD;  [name];  Service: Endoscopy;  Laterality: N/A;  . ESOPHAGOGASTRODUODENOSCOPY N/A 10/06/2016   mild chroni gastritis, negative H.pylori  . ESOPHAGOGASTRODUODENOSCOPY (EGD) WITH PROPOFOL N/A  03/12/2017   mild edema/erythema of stomach, small bowel biopsy with focal villous tip lymphocytosis, ?partially developed celiac  . 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 GIVENS CAPSULE STUDY  10/08/2016   normal   . GIVENS CAPSULE STUDY N/A 10/29/2016   occasional gastric erosion, unremarkable small bowel  . LEFT HEART CATH AND CORONARY ANGIOGRAPHY N/A 11/12/2018   Procedure: LEFT HEART CATH AND CORONARY ANGIOGRAPHY;  Surgeon: Burnell Blanks, MD;  Location: [facility];  Service: Cardiovascular;  Laterality: N/A;  . ORIF HUMERUS FRACTURE Left 02/07/2015   Procedure: OPEN REDUCTION INTERNAL FIXATION (ORIF) PROXIMAL HUMERUS FRACTURE;  Surgeon: Marybelle Killings, MD;  Location: [facility];  Service: Orthopedics;  Laterality: Left;  . TEE WITHOUT CARDIOVERSION N/A 11/20/2018   Procedure: TRANSESOPHAGEAL ECHOCARDIOGRAM (TEE);  Surgeon: Melrose Nakayama, MD;  Location: [facility];  Service: Open Heart Surgery;  Laterality: N/A;  . teeth extractions    . THYROID SURGERY    . TOTAL HIP ARTHROPLASTY Left 02/07/2015   Procedure: TOTAL HIP ARTHROPLASTY ANTERIOR APPROACH ;  Surgeon: Marybelle Killings, MD;  Location: [facility];  Service: Orthopedics;  Laterality: Left;  . WRIST SURGERY Left      SOCIAL HISTORY:  Social History   Socioeconomic History  . Marital status: Widowed    Spouse name: [name]  . Number of children: 2  . Years of education: 76  . Highest education level: Not on file  Occupational History  . Occupation: retired  Scientific laboratory technician  . Financial resource strain: Not on file  . Food insecurity    Worry: Not on file    Inability: Not on file  . Transportation needs    Medical: Not on file    Non-medical: Not on file  Tobacco Use  . Smoking status: Never Smoker  . Smokeless tobacco: Never Used  Substance and Sexual Activity  . Alcohol use: No    Alcohol/week: 0.0 standard drinks  . Drug use: No  . Sexual activity: Not Currently  Lifestyle  . Physical activity    Days per week: Not on file    Minutes per session: Not on file  . Stress: Not on file  Relationships  . Social Herbalist on phone: Not on file    Gets together: Not on file    Attends religious service: Not on file     Active member of club or organization: Not on file    Attends meetings of clubs or organizations: Not on file    Relationship status: Not on file  . Intimate partner violence    Fear of current or ex partner: Not on file    Emotionally abused: Not on file    Physically abused: Not on file    Forced sexual activity: Not on file  Other Topics Concern  . Not on file  Social History Narrative   Lives with significant other/partner [name]   He is her caregiver   Moved from [facility]      Her Son Adelynne Joerger (lives In [place]) -- ph # [phone] ( would like to be called about care issues)    FAMILY HISTORY:  Family History  Problem Relation Age of Onset  . Diabetes Mother   . Asthma Mother   . Early death Mother 78  Pneumonia  . Early death Father        Killed at rodeo  . CAD Neg Hx   . GI Bleed Neg Hx     CURRENT MEDICATIONS:  Outpatient Encounter Medications as of 12/29/2018  Medication Sig  . acetaminophen (TYLENOL) 325 MG tablet Take 2 tablets (650 mg total) by mouth every 6 (six) hours as needed for mild pain or headache.  . Alcohol Swabs (B-D SINGLE USE SWABS REGULAR) PADS   . amiodarone (PACERONE) 200 MG tablet Take 1 tablet (200 mg total) by mouth daily.  Marland Kitchen amLODipine (NORVASC) 10 MG tablet Take 1 tablet (10 mg total) by mouth daily.  . Ascorbic Acid (VITAMIN C) 100 MG tablet Take 1 tablet (100 mg total) by mouth daily.  Marland Kitchen aspirin EC 81 MG EC tablet Take 1 tablet (81 mg total) by mouth daily.  . BD PEN NEEDLE MICRO U/F 32G X 6 MM MISC   . calcium acetate (PHOSLO) 667 MG capsule Take 667 mg by mouth 3 (three) times daily with meals.  . Cholecalciferol (VITAMIN D-3) 125 MCG (5000 UT) TABS Take 5,000 mg by mouth 3 (three) times a week.   . docusate sodium (COLACE) 100 MG capsule Take 1 capsule (100 mg total) by mouth daily.  Marland Kitchen gabapentin (NEURONTIN) 300 MG capsule Take 2 capsules (600 mg total) by mouth at bedtime.  Marland Kitchen glucose blood test strip 1 each by Other route 4 (four)  times daily. Use as instructed bid. E11.65  . metoprolol succinate (TOPROL XL) 25 MG 24 hr tablet Take 0.5 tablets (12.5 mg total) by mouth daily.  . Multiple Vitamin (MULTIVITAMIN WITH MINERALS) TABS tablet Take 1 tablet by mouth daily.  . NP THYROID 120 MG tablet Take 120 mg by mouth daily.  . pantoprazole (PROTONIX) 40 MG tablet Take 1 tablet (40 mg total) by mouth 2 (two) times daily. 30 minutes before breakfast (Patient taking differently: Take 40 mg by mouth daily before breakfast. )  . sodium bicarbonate 650 MG tablet Take 650 mg by mouth 2 (two) times daily.  . traMADol (ULTRAM) 50 MG tablet Take 1 tablet (50 mg total) by mouth every 12 (twelve) hours as needed for moderate pain.  Marland Kitchen ULORIC 40 MG tablet Take 40 mg by mouth daily.   Marland Kitchen warfarin (COUMADIN) 2.5 MG tablet Take 2 tablets (5 mg total) by mouth every Tuesday, Thursday, Saturday, and Sunday at 6 PM AND 1 tablet (2.5 mg total) every Monday, Wednesday, and Friday at 6 PM. Do all this for 21 days. (Patient taking differently: 1 and 1/2 tablets at bedtime)  . Insulin Glargine (TOUJEO SOLOSTAR [place]) Inject 5 Units into the skin at bedtime.   No facility-administered encounter medications on file as of 12/29/2018.     ALLERGIES:  Allergies  Allergen Reactions  . Insulin Glargine Swelling    "Makes me swell like a balloon all over", including face, but without any respiratory distress or rashes. Associated with weight gain.  . Statins Other (See Comments)    "I've tried them all; my muscle aches were so bad I couldn't walk".     PHYSICAL EXAM:  ECOG Performance status: 1 Blood pressure is 134/53.  Pulse rate is 72.  Respiratory 17.  Temperature 98.  Saturations are 99%. Physical Exam Vitals signs reviewed.  Constitutional:      Appearance: Normal appearance.  Cardiovascular:     Rate and Rhythm: Normal rate and regular rhythm.     Heart sounds: Normal heart sounds.  Pulmonary:     Effort: Pulmonary effort is normal.     Breath  sounds: Normal breath sounds.  Abdominal:     General: There is no distension.     Palpations: Abdomen is soft. There is no mass.  Musculoskeletal:        General: No swelling.  Skin:    General: Skin is warm.  Neurological:     General: No focal deficit present.     Mental Status: She is alert and oriented to person, place, and time.  Psychiatric:        Mood and Affect: Mood normal.        Behavior: Behavior normal.    Decreased breath sounds at left base.  LABORATORY DATA:  I have reviewed the labs as listed.  CBC    Component Value Date/Time   WBC 6.2 12/29/2018 1003   RBC 3.27 (L) 12/29/2018 1003   HGB 9.5 (L) 12/29/2018 1003   HCT 31.1 (L) 12/29/2018 1003   PLT 354 12/29/2018 1003   MCV 95.1 12/29/2018 1003   MCH 29.1 12/29/2018 1003   MCHC 30.5 12/29/2018 1003   RDW 15.4 12/29/2018 1003   LYMPHSABS 1.4 12/29/2018 1003   MONOABS 0.6 12/29/2018 1003   EOSABS 0.5 12/29/2018 1003   BASOSABS 0.1 12/29/2018 1003   CMP Latest Ref Rng & Units 12/29/2018 11/29/2018 11/28/2018  Glucose 70 - 99 mg/dL 166(H) 188(H) 101(H)  BUN 8 - 23 mg/dL 24(H) 38(H) 45(H)  Creatinine 0.44 - 1.00 mg/dL 4.02(H) 5.33(H) 5.45(H)  Sodium 135 - 145 mmol/L 140 138 135  Potassium 3.5 - 5.1 mmol/L 3.5 3.6 3.8  Chloride 98 - 111 mmol/L 99 100 98  CO2 22 - 32 mmol/L 29 25 25   Calcium 8.9 - 10.3 mg/dL 9.1 8.8(L) 8.6(L)  Total Protein 6.5 - 8.1 g/dL 6.3(L) - -  Total Bilirubin 0.3 - 1.2 mg/dL 0.5 - -  Alkaline Phos 38 - 126 U/L 90 - -  AST 15 - 41 U/L 22 - -  ALT 0 - 44 U/L 14 - -       DIAGNOSTIC IMAGING:  I have independently reviewed the scans and discussed with the patient.      ASSESSMENT & PLAN:   Normocytic anemia 1.  Normocytic anemia: - This is from combination of ESRD and iron deficiency. - She was receiving Retacrit 40,000 units every 4 weeks, last injection was on 10/29/2018. -She was hospitalized on 11/20/2018 and underwent CABG. - She was reportedly started on hemodialysis,  Tuesday Thursday and Saturday at [facility] in [place] a month ago.  She follows up with Dr. Lowanda Foster. - Last Feraheme infusion was on 07/11/2018 and 07/18/2018 at our center. -We reviewed her blood work.  Hemoglobin is 9.5 with MCV of 95. -She will continue receiving parenteral iron as well as erythropoietin stimulating agents at the dialysis center. - We will see her back in 4 months with repeat labs.      Orders placed this encounter:  Orders Placed This Encounter  Procedures  . CBC with Differential  . Iron and TIBC  . Ferritin  . Vitamin B12  . Folate      Derek Jack, MD [place] [phone]

## 2018-12-29 NOTE — Patient Instructions (Addendum)
[facility] at [facility] Discharge Instructions  You were seen today by Dr. Katragadda. He went over your recent lab results. He will see you back in 4 months for labs and follow up.   Thank you for choosing [facility] at [facility] to provide your oncology and hematology care.  To afford each patient quality time with our provider, please arrive at least 15 minutes before your scheduled appointment time.   If you have a lab appointment with the Cancer Center please come in thru the  Main Entrance and check in at the main information desk  You need to re-schedule your appointment should you arrive 10 or more minutes late.  We strive to give you quality time with our providers, and arriving late affects you and other patients whose appointments are after yours.  Also, if you no show three or more times for appointments you may be dismissed from the clinic at the providers discretion.     Again, thank you for choosing [facility].  Our hope is that these requests will decrease the amount of time that you wait before being seen by our physicians.       _____________________________________________________________  Should you have questions after your visit to [facility], please contact our office at [phone] between the hours of 8:00 a.m. and 4:30 p.m.  Voicemails left after 4:00 p.m. will not be returned until the following business day.  For prescription refill requests, have your pharmacy contact our office and allow 72 hours.    Cancer Center Support Programs:   > Cancer Support Group  2nd Tuesday of the month 1pm-2pm, Journey Room    

## 2018-12-29 NOTE — Assessment & Plan Note (Signed)
1.  Normocytic anemia: - This is from combination of ESRD and iron deficiency. - She was receiving Retacrit 40,000 units every 4 weeks, last injection was on 10/29/2018. -She was hospitalized on 11/20/2018 and underwent CABG. - She was reportedly started on hemodialysis, Tuesday Thursday and Saturday at [facility] in [place] a month ago.  She follows up with Dr. Lowanda Foster. - Last Feraheme infusion was on 07/11/2018 and 07/18/2018 at our center. -We reviewed her blood work.  Hemoglobin is 9.5 with MCV of 95. -She will continue receiving parenteral iron as well as erythropoietin stimulating agents at the dialysis center. - We will see her back in 4 months with repeat labs.

## 2018-12-30 DIAGNOSIS — Z992 Dependence on renal dialysis: Secondary | ICD-10-CM | POA: Diagnosis not present

## 2018-12-30 DIAGNOSIS — Z23 Encounter for immunization: Secondary | ICD-10-CM | POA: Diagnosis not present

## 2018-12-30 DIAGNOSIS — N186 End stage renal disease: Secondary | ICD-10-CM | POA: Diagnosis not present

## 2018-12-31 ENCOUNTER — Ambulatory Visit (INDEPENDENT_AMBULATORY_CARE_PROVIDER_SITE_OTHER): Payer: Medicare HMO | Admitting: *Deleted

## 2018-12-31 DIAGNOSIS — I5033 Acute on chronic diastolic (congestive) heart failure: Secondary | ICD-10-CM | POA: Diagnosis not present

## 2018-12-31 DIAGNOSIS — I132 Hypertensive heart and chronic kidney disease with heart failure and with stage 5 chronic kidney disease, or end stage renal disease: Secondary | ICD-10-CM | POA: Diagnosis not present

## 2018-12-31 DIAGNOSIS — I4821 Permanent atrial fibrillation: Secondary | ICD-10-CM

## 2018-12-31 DIAGNOSIS — I214 Non-ST elevation (NSTEMI) myocardial infarction: Secondary | ICD-10-CM | POA: Diagnosis not present

## 2018-12-31 DIAGNOSIS — Z5181 Encounter for therapeutic drug level monitoring: Secondary | ICD-10-CM | POA: Diagnosis not present

## 2018-12-31 DIAGNOSIS — Z48812 Encounter for surgical aftercare following surgery on the circulatory system: Secondary | ICD-10-CM | POA: Diagnosis not present

## 2018-12-31 DIAGNOSIS — I5022 Chronic systolic (congestive) heart failure: Secondary | ICD-10-CM | POA: Diagnosis not present

## 2018-12-31 DIAGNOSIS — I0981 Rheumatic heart failure: Secondary | ICD-10-CM | POA: Diagnosis not present

## 2018-12-31 DIAGNOSIS — I2510 Atherosclerotic heart disease of native coronary artery without angina pectoris: Secondary | ICD-10-CM | POA: Diagnosis not present

## 2018-12-31 DIAGNOSIS — N186 End stage renal disease: Secondary | ICD-10-CM | POA: Diagnosis not present

## 2018-12-31 DIAGNOSIS — E1122 Type 2 diabetes mellitus with diabetic chronic kidney disease: Secondary | ICD-10-CM | POA: Diagnosis not present

## 2018-12-31 LAB — POCT INR: INR: 2.3 (ref 2.0–3.0)

## 2018-12-31 NOTE — Patient Instructions (Signed)
Continue coumadin 1 1/2 tablets (7.5mg ) daily Recheck in 3 weeks in the office.  D/C from [facility] today.  Orders given to [facility] RN with Alvis Lemmings On Amiodarone 200mg  daily

## 2019-01-01 DIAGNOSIS — N186 End stage renal disease: Secondary | ICD-10-CM | POA: Diagnosis not present

## 2019-01-01 DIAGNOSIS — Z992 Dependence on renal dialysis: Secondary | ICD-10-CM | POA: Diagnosis not present

## 2019-01-01 DIAGNOSIS — Z23 Encounter for immunization: Secondary | ICD-10-CM | POA: Diagnosis not present

## 2019-01-02 ENCOUNTER — Other Ambulatory Visit: Payer: Self-pay | Admitting: Thoracic Surgery (Cardiothoracic Vascular Surgery)

## 2019-01-02 ENCOUNTER — Other Ambulatory Visit: Payer: Self-pay | Admitting: Physician Assistant

## 2019-01-02 ENCOUNTER — Ambulatory Visit: Payer: Medicare HMO | Admitting: Cardiology

## 2019-01-02 DIAGNOSIS — I2510 Atherosclerotic heart disease of native coronary artery without angina pectoris: Secondary | ICD-10-CM

## 2019-01-03 DIAGNOSIS — Z23 Encounter for immunization: Secondary | ICD-10-CM | POA: Diagnosis not present

## 2019-01-03 DIAGNOSIS — N186 End stage renal disease: Secondary | ICD-10-CM | POA: Diagnosis not present

## 2019-01-03 DIAGNOSIS — Z992 Dependence on renal dialysis: Secondary | ICD-10-CM | POA: Diagnosis not present

## 2019-01-05 ENCOUNTER — Other Ambulatory Visit: Payer: Self-pay

## 2019-01-05 ENCOUNTER — Ambulatory Visit
Admission: RE | Admit: 2019-01-05 | Discharge: 2019-01-05 | Disposition: A | Discharge status: Home / Self Care | Payer: Medicare HMO | Source: Ambulatory Visit | Attending: Thoracic Surgery (Cardiothoracic Vascular Surgery) | Admitting: Thoracic Surgery (Cardiothoracic Vascular Surgery)

## 2019-01-05 ENCOUNTER — Telehealth: Payer: Self-pay | Admitting: Cardiology

## 2019-01-05 ENCOUNTER — Ambulatory Visit (INDEPENDENT_AMBULATORY_CARE_PROVIDER_SITE_OTHER): Payer: Self-pay | Admitting: Thoracic Surgery (Cardiothoracic Vascular Surgery)

## 2019-01-05 ENCOUNTER — Encounter: Payer: Self-pay | Admitting: Thoracic Surgery (Cardiothoracic Vascular Surgery)

## 2019-01-05 VITALS — BP 118/60 | HR 83 | Temp 97.7°F | Resp 16 | Ht 64.0 in | Wt 160.0 lb

## 2019-01-05 DIAGNOSIS — I2510 Atherosclerotic heart disease of native coronary artery without angina pectoris: Secondary | ICD-10-CM

## 2019-01-05 DIAGNOSIS — Z951 Presence of aortocoronary bypass graft: Secondary | ICD-10-CM

## 2019-01-05 DIAGNOSIS — Z992 Dependence on renal dialysis: Secondary | ICD-10-CM

## 2019-01-05 DIAGNOSIS — N186 End stage renal disease: Secondary | ICD-10-CM

## 2019-01-05 DIAGNOSIS — J90 Pleural effusion, not elsewhere classified: Secondary | ICD-10-CM | POA: Diagnosis not present

## 2019-01-05 MED ORDER — WARFARIN SODIUM 2.5 MG PO TABS: ORAL_TABLET | ORAL | 3 refills | Status: DC

## 2019-01-05 NOTE — Telephone Encounter (Signed)
°  Also needs a RX for Repatha sent into [facility] in [place]* If patient is at the pharmacy, call can be transferred to refill team.   1. Which medications need to be refilled?Warfarin 2. Which pharmacy/location (including street and city if local pharmacy) is medication to be sent to? [place]  3. Do they need a 30 day or 90 day supply?

## 2019-01-05 NOTE — Progress Notes (Signed)
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  She presented with a non-ST elevation MI in June.  She had severe three-vessel disease.  I did coronary artery bypass grafting x4 on 11/20/2018.  She did well postoperatively.  She did have some atrial fibrillation and was discharged on Coumadin.  She went home on day 9.  She was seen in the office on 12/22/2018.  She she was doing well at that time but did have a small left pleural effusion.  In the interim since her last visit she has been feeling well.  She says her exercise tolerance is good.  She feels better than she did prior to surgery.  She is doing dialysis 3 times a week.  She does notice some swelling in her legs particularly the day since it has been 2 days since her last dialysis.  She is on a Tuesday Thursday Saturday schedule.  She has not had any anginal pain.  She does still have some occasional burning sensation in the chest wall around her sternal incision.  That is becoming less frequent.  Past Medical History:  Diagnosis Date  . Anemia   . Arthritis   . Blood transfusion without reported diagnosis   . CAD (coronary artery disease)    a. 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  . Cataract   . Chronic anemia   . Chronic combined systolic and diastolic CHF (congestive heart failure) ([place])    a. 2D echo 08/2016 at [facility]: EF 50-55% with inferior wall HK, impaired LV filling, fair study.  . CKD (chronic kidney disease), stage III ([place])   . Diabetes ([place])   . Gastritis   . GERD (gastroesophageal reflux disease)    . Gout   . Headache   . HTN (hypertension)   . Hyperlipidemia   . Hypothyroidism   . Iron deficiency anemia 11/08/2016  . Normocytic anemia 10/26/2016  . NSTEMI (non-ST elevated myocardial infarction) ([place])    a. Complex admission 08/2016 - with severe hyperglycemia, AKI on CKD, severe anemia down to Hgb 6.8, acute combined CHF, troponin of 8.5, cath deferred due to renal dysfunction.  . Thyroid disease   . Wears dentures     Current Outpatient Medications  Medication Sig Dispense Refill  . acetaminophen (TYLENOL) 325 MG tablet Take 2 tablets (650 mg total) by mouth every 6 (six) hours as needed for mild pain or headache.    . Alcohol Swabs (B-D SINGLE USE SWABS REGULAR) PADS     . amiodarone (PACERONE) 200 MG tablet Take 1 tablet (200 mg total) by mouth daily. 30 tablet 1  . amLODipine (NORVASC) 10 MG tablet Take 1 tablet (10 mg total) by mouth daily. 90 tablet 3  . Ascorbic Acid (VITAMIN C) 100 MG tablet Take 1 tablet (100 mg total) by mouth daily.    Marland Kitchen aspirin EC 81 MG EC tablet Take 1 tablet (81 mg total) by mouth daily.    . BD PEN NEEDLE MICRO U/F 32G X 6 MM MISC     .  calcium acetate (PHOSLO) 667 MG capsule Take 667 mg by mouth 3 (three) times daily with meals.    . Cholecalciferol (VITAMIN D-3) 125 MCG (5000 UT) TABS Take 5,000 mg by mouth 3 (three) times a week.     . docusate sodium (COLACE) 100 MG capsule Take 1 capsule (100 mg total) by mouth daily.    Marland Kitchen gabapentin (NEURONTIN) 300 MG capsule Take 2 capsules (600 mg total) by mouth at bedtime. 180 capsule 0  . glucose blood test strip 1 each by Other route 4 (four) times daily. Use as instructed bid. E11.65 400 each 2  . Insulin Glargine (TOUJEO SOLOSTAR ) Inject 5 Units into the skin at bedtime.    . metoprolol succinate (TOPROL XL) 25 MG 24 hr tablet Take 0.5 tablets (12.5 mg total) by mouth daily. 30 tablet 11  . Multiple Vitamin (MULTIVITAMIN WITH MINERALS) TABS tablet Take 1 tablet by mouth daily.    . NP THYROID 120 MG  tablet Take 120 mg by mouth daily.    . pantoprazole (PROTONIX) 40 MG tablet Take 1 tablet (40 mg total) by mouth 2 (two) times daily. 30 minutes before breakfast (Patient taking differently: Take 40 mg by mouth daily before breakfast. ) 180 tablet 3  . sodium bicarbonate 650 MG tablet Take 650 mg by mouth 2 (two) times daily.    . traMADol (ULTRAM) 50 MG tablet Take 1 tablet (50 mg total) by mouth every 12 (twelve) hours as needed for moderate pain. 24 tablet 0  . ULORIC 40 MG tablet Take 40 mg by mouth daily.   3  . warfarin (COUMADIN) 2.5 MG tablet Take 1 1/2 tablets daily or as directed 50 tablet 3   No current facility-administered medications for this visit.     Physical Exam BP 118/60 (BP Location: Left Arm, Patient Position: Sitting, Cuff Size: Normal)   Pulse 83   Temp 97.7 F (36.5 C)   Resp 16   Ht 5\' 4"  (1.626 m)   Wt 160 lb (72.6 kg)   SpO2 97% Comment: RA  BMI 27.60 kg/m  78 year old woman in no acute distress Alert and oriented x3 with no focal deficits Lungs clear with equal breath sounds bilaterally Cardiac regular rate and rhythm, no murmur Sternum stable, incision healing well Leg incision healing well 1+ edema both lower extremities  Diagnostic Tests: CHEST - 2 VIEW  COMPARISON:  December 22, 2018  FINDINGS: There is a persistent small to moderate-sized left-sided pleural effusion which appears very similar in size when compared to prior study. The heart is enlarged. The patient is status post prior median sternotomy. Patient is status post prior plate screw fixation of the proximal left humerus. There may be a trace right-sided pleural effusion. There is no focal area of consolidation. There is some atelectasis at the left lung base.  IMPRESSION: 1. Stable to slightly decreased left-sided pleural effusion. 2. Stable appearance of the cardiac silhouette.   Electronically Signed   By: Constance Holster M.D.   On: 01/05/2019 15:59 I personally  reviewed the chest x-ray images.  There is a small, not moderate, left effusion that is decreased in size from her previous film.  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   She presented in June with unstable coronary syndrome and ruled  in for non-ST elevation MI.  She had severe three-vessel coronary disease.  She underwent coronary artery bypass grafting x4 on 11/20/2018.  Given her age and comorbidities I think she did exceptionally well.  She did go home on postoperative day #9.  Postoperative atrial fibrillation-she is in a regular rhythm today.  She is on Coumadin.  Dr. Nelly Laurence office is managing that.  From a surgical standpoint she is doing well.  She has a small left effusion that is decreased from her previous film.  That does not require any further follow-up or intervention.  She is over 6 weeks out from surgery so there are no restrictions on her activities, but she was advised to build into new activities slowly and gradually to avoid undue discomfort.  Plan:  Continue to follow-up with Dr. Harl Bowie.  I will be happy to see [name] back anytime in the future if I can be of any further assistance with her care.  [place]rose Nakayama, MD [facility] [phone]

## 2019-01-06 ENCOUNTER — Telehealth: Payer: Self-pay | Admitting: Cardiology

## 2019-01-06 DIAGNOSIS — N186 End stage renal disease: Secondary | ICD-10-CM | POA: Diagnosis not present

## 2019-01-06 DIAGNOSIS — Z23 Encounter for immunization: Secondary | ICD-10-CM | POA: Diagnosis not present

## 2019-01-06 DIAGNOSIS — Z992 Dependence on renal dialysis: Secondary | ICD-10-CM | POA: Diagnosis not present

## 2019-01-06 MED ORDER — REPATHA SURECLICK 140 MG/ML ~~LOC~~ SOAJ: 140.0000 mg | SUBCUTANEOUS | 12 refills | Status: AC

## 2019-01-06 NOTE — Telephone Encounter (Signed)
LMOM, rx sent to [facility]

## 2019-01-06 NOTE — Telephone Encounter (Signed)
Patient calling and checking on status of Repatha Rx being sent into pharmacy She called yesterday in reference to this.

## 2019-01-07 ENCOUNTER — Ambulatory Visit: Payer: Medicare HMO | Admitting: Thoracic Surgery (Cardiothoracic Vascular Surgery)

## 2019-01-07 NOTE — Telephone Encounter (Signed)
We started the paper work and just recently go her approved.  We don't usually put the prescription in the system until we know if it will be approved.  She's working on some patient assistance forms currently, as I believe it will be cost prohibitive to her.

## 2019-01-08 DIAGNOSIS — N186 End stage renal disease: Secondary | ICD-10-CM | POA: Diagnosis not present

## 2019-01-08 DIAGNOSIS — Z992 Dependence on renal dialysis: Secondary | ICD-10-CM | POA: Diagnosis not present

## 2019-01-08 DIAGNOSIS — Z23 Encounter for immunization: Secondary | ICD-10-CM | POA: Diagnosis not present

## 2019-01-09 DIAGNOSIS — I2510 Atherosclerotic heart disease of native coronary artery without angina pectoris: Secondary | ICD-10-CM | POA: Diagnosis not present

## 2019-01-09 DIAGNOSIS — I214 Non-ST elevation (NSTEMI) myocardial infarction: Secondary | ICD-10-CM | POA: Diagnosis not present

## 2019-01-09 DIAGNOSIS — E1122 Type 2 diabetes mellitus with diabetic chronic kidney disease: Secondary | ICD-10-CM | POA: Diagnosis not present

## 2019-01-09 DIAGNOSIS — I132 Hypertensive heart and chronic kidney disease with heart failure and with stage 5 chronic kidney disease, or end stage renal disease: Secondary | ICD-10-CM | POA: Diagnosis not present

## 2019-01-09 DIAGNOSIS — Z48812 Encounter for surgical aftercare following surgery on the circulatory system: Secondary | ICD-10-CM | POA: Diagnosis not present

## 2019-01-09 DIAGNOSIS — N186 End stage renal disease: Secondary | ICD-10-CM | POA: Diagnosis not present

## 2019-01-09 DIAGNOSIS — I5022 Chronic systolic (congestive) heart failure: Secondary | ICD-10-CM | POA: Diagnosis not present

## 2019-01-09 DIAGNOSIS — I5033 Acute on chronic diastolic (congestive) heart failure: Secondary | ICD-10-CM | POA: Diagnosis not present

## 2019-01-09 DIAGNOSIS — I0981 Rheumatic heart failure: Secondary | ICD-10-CM | POA: Diagnosis not present

## 2019-01-10 DIAGNOSIS — N186 End stage renal disease: Secondary | ICD-10-CM | POA: Diagnosis not present

## 2019-01-10 DIAGNOSIS — Z992 Dependence on renal dialysis: Secondary | ICD-10-CM | POA: Diagnosis not present

## 2019-01-10 DIAGNOSIS — Z23 Encounter for immunization: Secondary | ICD-10-CM | POA: Diagnosis not present

## 2019-01-13 DIAGNOSIS — N186 End stage renal disease: Secondary | ICD-10-CM | POA: Diagnosis not present

## 2019-01-13 DIAGNOSIS — Z992 Dependence on renal dialysis: Secondary | ICD-10-CM | POA: Diagnosis not present

## 2019-01-13 DIAGNOSIS — Z23 Encounter for immunization: Secondary | ICD-10-CM | POA: Diagnosis not present

## 2019-01-15 DIAGNOSIS — Z23 Encounter for immunization: Secondary | ICD-10-CM | POA: Diagnosis not present

## 2019-01-15 DIAGNOSIS — N186 End stage renal disease: Secondary | ICD-10-CM | POA: Diagnosis not present

## 2019-01-15 DIAGNOSIS — Z992 Dependence on renal dialysis: Secondary | ICD-10-CM | POA: Diagnosis not present

## 2019-01-16 DIAGNOSIS — Z48812 Encounter for surgical aftercare following surgery on the circulatory system: Secondary | ICD-10-CM | POA: Diagnosis not present

## 2019-01-16 DIAGNOSIS — E1122 Type 2 diabetes mellitus with diabetic chronic kidney disease: Secondary | ICD-10-CM | POA: Diagnosis not present

## 2019-01-16 DIAGNOSIS — I2510 Atherosclerotic heart disease of native coronary artery without angina pectoris: Secondary | ICD-10-CM | POA: Diagnosis not present

## 2019-01-16 DIAGNOSIS — N186 End stage renal disease: Secondary | ICD-10-CM | POA: Diagnosis not present

## 2019-01-16 DIAGNOSIS — I5033 Acute on chronic diastolic (congestive) heart failure: Secondary | ICD-10-CM | POA: Diagnosis not present

## 2019-01-16 DIAGNOSIS — I214 Non-ST elevation (NSTEMI) myocardial infarction: Secondary | ICD-10-CM | POA: Diagnosis not present

## 2019-01-16 DIAGNOSIS — I5022 Chronic systolic (congestive) heart failure: Secondary | ICD-10-CM | POA: Diagnosis not present

## 2019-01-16 DIAGNOSIS — I132 Hypertensive heart and chronic kidney disease with heart failure and with stage 5 chronic kidney disease, or end stage renal disease: Secondary | ICD-10-CM | POA: Diagnosis not present

## 2019-01-16 DIAGNOSIS — I0981 Rheumatic heart failure: Secondary | ICD-10-CM | POA: Diagnosis not present

## 2019-01-17 DIAGNOSIS — N186 End stage renal disease: Secondary | ICD-10-CM | POA: Diagnosis not present

## 2019-01-17 DIAGNOSIS — Z992 Dependence on renal dialysis: Secondary | ICD-10-CM | POA: Diagnosis not present

## 2019-01-17 DIAGNOSIS — Z23 Encounter for immunization: Secondary | ICD-10-CM | POA: Diagnosis not present

## 2019-01-20 ENCOUNTER — Ambulatory Visit (INDEPENDENT_AMBULATORY_CARE_PROVIDER_SITE_OTHER): Payer: Medicare HMO | Admitting: *Deleted

## 2019-01-20 ENCOUNTER — Other Ambulatory Visit: Payer: Self-pay

## 2019-01-20 DIAGNOSIS — Z5181 Encounter for therapeutic drug level monitoring: Secondary | ICD-10-CM | POA: Diagnosis not present

## 2019-01-20 DIAGNOSIS — Z23 Encounter for immunization: Secondary | ICD-10-CM | POA: Diagnosis not present

## 2019-01-20 DIAGNOSIS — N186 End stage renal disease: Secondary | ICD-10-CM | POA: Diagnosis not present

## 2019-01-20 DIAGNOSIS — Z992 Dependence on renal dialysis: Secondary | ICD-10-CM | POA: Diagnosis not present

## 2019-01-20 DIAGNOSIS — I480 Paroxysmal atrial fibrillation: Secondary | ICD-10-CM | POA: Diagnosis not present

## 2019-01-20 LAB — POCT INR: INR: 3.5 — AB (ref 2.0–3.0)

## 2019-01-20 NOTE — Patient Instructions (Signed)
Hold coumadin tonight then decrease dose to 1 1/2 tablets (7.5mg ) daily except 1 tablet (5mg ) on Tuesdays and Fridays Recheck in 3 weeks in the office.

## 2019-01-22 DIAGNOSIS — N186 End stage renal disease: Secondary | ICD-10-CM | POA: Diagnosis not present

## 2019-01-22 DIAGNOSIS — Z23 Encounter for immunization: Secondary | ICD-10-CM | POA: Diagnosis not present

## 2019-01-22 DIAGNOSIS — Z992 Dependence on renal dialysis: Secondary | ICD-10-CM | POA: Diagnosis not present

## 2019-01-24 DIAGNOSIS — Z23 Encounter for immunization: Secondary | ICD-10-CM | POA: Diagnosis not present

## 2019-01-24 DIAGNOSIS — N186 End stage renal disease: Secondary | ICD-10-CM | POA: Diagnosis not present

## 2019-01-24 DIAGNOSIS — Z992 Dependence on renal dialysis: Secondary | ICD-10-CM | POA: Diagnosis not present

## 2019-01-26 DIAGNOSIS — N186 End stage renal disease: Secondary | ICD-10-CM | POA: Diagnosis not present

## 2019-01-26 DIAGNOSIS — Z992 Dependence on renal dialysis: Secondary | ICD-10-CM | POA: Diagnosis not present

## 2019-01-27 DIAGNOSIS — Z23 Encounter for immunization: Secondary | ICD-10-CM | POA: Diagnosis not present

## 2019-01-27 DIAGNOSIS — N186 End stage renal disease: Secondary | ICD-10-CM | POA: Diagnosis not present

## 2019-01-27 DIAGNOSIS — Z992 Dependence on renal dialysis: Secondary | ICD-10-CM | POA: Diagnosis not present

## 2019-01-28 ENCOUNTER — Other Ambulatory Visit: Payer: Self-pay | Admitting: Physician Assistant

## 2019-01-28 ENCOUNTER — Other Ambulatory Visit: Payer: Self-pay | Admitting: *Deleted

## 2019-01-28 MED ORDER — AMIODARONE HCL 200 MG PO TABS: 200.0000 mg | ORAL_TABLET | Freq: Every day | ORAL | 6 refills | Status: AC

## 2019-01-29 DIAGNOSIS — Z23 Encounter for immunization: Secondary | ICD-10-CM | POA: Diagnosis not present

## 2019-01-29 DIAGNOSIS — Z992 Dependence on renal dialysis: Secondary | ICD-10-CM | POA: Diagnosis not present

## 2019-01-29 DIAGNOSIS — N186 End stage renal disease: Secondary | ICD-10-CM | POA: Diagnosis not present

## 2019-01-30 ENCOUNTER — Other Ambulatory Visit (INDEPENDENT_AMBULATORY_CARE_PROVIDER_SITE_OTHER): Payer: Self-pay | Admitting: Internal Medicine

## 2019-01-31 DIAGNOSIS — Z992 Dependence on renal dialysis: Secondary | ICD-10-CM | POA: Diagnosis not present

## 2019-01-31 DIAGNOSIS — Z23 Encounter for immunization: Secondary | ICD-10-CM | POA: Diagnosis not present

## 2019-01-31 DIAGNOSIS — N186 End stage renal disease: Secondary | ICD-10-CM | POA: Diagnosis not present

## 2019-02-01 ENCOUNTER — Other Ambulatory Visit (INDEPENDENT_AMBULATORY_CARE_PROVIDER_SITE_OTHER): Payer: Self-pay | Admitting: Internal Medicine

## 2019-02-01 DIAGNOSIS — E039 Hypothyroidism, unspecified: Secondary | ICD-10-CM

## 2019-02-03 DIAGNOSIS — Z992 Dependence on renal dialysis: Secondary | ICD-10-CM | POA: Diagnosis not present

## 2019-02-03 DIAGNOSIS — N186 End stage renal disease: Secondary | ICD-10-CM | POA: Diagnosis not present

## 2019-02-03 DIAGNOSIS — Z23 Encounter for immunization: Secondary | ICD-10-CM | POA: Diagnosis not present

## 2019-02-05 ENCOUNTER — Telehealth: Payer: Self-pay

## 2019-02-05 DIAGNOSIS — Z23 Encounter for immunization: Secondary | ICD-10-CM | POA: Diagnosis not present

## 2019-02-05 DIAGNOSIS — N186 End stage renal disease: Secondary | ICD-10-CM | POA: Diagnosis not present

## 2019-02-05 DIAGNOSIS — Z992 Dependence on renal dialysis: Secondary | ICD-10-CM | POA: Diagnosis not present

## 2019-02-05 NOTE — Telephone Encounter (Signed)
Called and lmomed the pt regarding needing to know the status of those forms whether she completed it or not

## 2019-02-07 DIAGNOSIS — N186 End stage renal disease: Secondary | ICD-10-CM | POA: Diagnosis not present

## 2019-02-07 DIAGNOSIS — Z23 Encounter for immunization: Secondary | ICD-10-CM | POA: Diagnosis not present

## 2019-02-07 DIAGNOSIS — Z992 Dependence on renal dialysis: Secondary | ICD-10-CM | POA: Diagnosis not present

## 2019-02-09 ENCOUNTER — Other Ambulatory Visit (INDEPENDENT_AMBULATORY_CARE_PROVIDER_SITE_OTHER): Payer: Self-pay | Admitting: Internal Medicine

## 2019-02-09 ENCOUNTER — Other Ambulatory Visit: Payer: Self-pay

## 2019-02-09 ENCOUNTER — Ambulatory Visit (INDEPENDENT_AMBULATORY_CARE_PROVIDER_SITE_OTHER): Payer: Medicare HMO | Admitting: Internal Medicine

## 2019-02-09 ENCOUNTER — Ambulatory Visit (INDEPENDENT_AMBULATORY_CARE_PROVIDER_SITE_OTHER): Payer: Medicare HMO | Admitting: *Deleted

## 2019-02-09 ENCOUNTER — Encounter (INDEPENDENT_AMBULATORY_CARE_PROVIDER_SITE_OTHER): Payer: Self-pay | Admitting: Internal Medicine

## 2019-02-09 VITALS — BP 120/60 | HR 72 | Ht 66.0 in | Wt 168.6 lb

## 2019-02-09 DIAGNOSIS — Z5181 Encounter for therapeutic drug level monitoring: Secondary | ICD-10-CM

## 2019-02-09 DIAGNOSIS — Z992 Dependence on renal dialysis: Secondary | ICD-10-CM

## 2019-02-09 DIAGNOSIS — I10 Essential (primary) hypertension: Secondary | ICD-10-CM | POA: Diagnosis not present

## 2019-02-09 DIAGNOSIS — E782 Mixed hyperlipidemia: Secondary | ICD-10-CM | POA: Diagnosis not present

## 2019-02-09 DIAGNOSIS — E785 Hyperlipidemia, unspecified: Secondary | ICD-10-CM

## 2019-02-09 DIAGNOSIS — R2689 Other abnormalities of gait and mobility: Secondary | ICD-10-CM | POA: Diagnosis not present

## 2019-02-09 DIAGNOSIS — M1000 Idiopathic gout, unspecified site: Secondary | ICD-10-CM

## 2019-02-09 DIAGNOSIS — E1169 Type 2 diabetes mellitus with other specified complication: Secondary | ICD-10-CM

## 2019-02-09 DIAGNOSIS — E118 Type 2 diabetes mellitus with unspecified complications: Secondary | ICD-10-CM

## 2019-02-09 DIAGNOSIS — I4821 Permanent atrial fibrillation: Secondary | ICD-10-CM | POA: Diagnosis not present

## 2019-02-09 DIAGNOSIS — N186 End stage renal disease: Secondary | ICD-10-CM | POA: Diagnosis not present

## 2019-02-09 DIAGNOSIS — I480 Paroxysmal atrial fibrillation: Secondary | ICD-10-CM

## 2019-02-09 DIAGNOSIS — E039 Hypothyroidism, unspecified: Secondary | ICD-10-CM | POA: Diagnosis not present

## 2019-02-09 LAB — POCT INR: INR: 2 (ref 2.0–3.0)

## 2019-02-09 NOTE — Patient Instructions (Signed)
Take coumadin 2 tablets tonight then resume 1 1/2 tablets (7.5mg ) daily except 1 tablet (5mg ) on Tuesdays and Fridays Recheck in 4 weeks in the office.

## 2019-02-09 NOTE — Progress Notes (Signed)
Wellness Office Visit  Subjective:  Patient ID: [name], female    DOB: 1940/06/01  Age: 78 y.o. MRN: [id number]  CC: This lady comes in for follow-up of her multiple medical problems including diabetes, hypertension, end-stage renal disease, hypothyroidism. HPI She is now undergoing dialysis 3 times a week and seems to be tolerating this reasonably well.  Her energy levels are good. As far as her diabetes is concerned, she has stopped taking any insulin as her last hemoglobin A1c back in June was well controlled.  She would like her A1c checked again today. She is also seeing the cancer center for anemia. She denies any chest pain, dyspnea, palpitations or limb weakness. She is beginning to start being more active now.  Past Medical History:  Diagnosis Date  . Anemia   . Arthritis   . Blood transfusion without reported diagnosis   . CAD (coronary artery disease)    a. 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  . Cataract   . Chronic anemia   . Chronic combined systolic and diastolic CHF (congestive heart failure) ([place])    a. 2D echo 08/2016 at [facility]: EF 50-55% with inferior wall HK, impaired LV filling, fair study.  . CKD (chronic kidney disease), stage III ([place])   . Diabetes ([place])   . Gastritis   . GERD (gastroesophageal reflux disease)   . Gout   . Headache   . HTN (hypertension)   . Hyperlipidemia   . Hypothyroidism   . Iron deficiency anemia 11/08/2016  . Normocytic anemia 10/26/2016  . NSTEMI (non-ST elevated myocardial infarction) ([place])    a. Complex admission 08/2016 - with severe hyperglycemia, AKI on CKD, severe anemia down to Hgb 6.8, acute combined CHF, troponin of 8.5, cath deferred due to renal dysfunction.  . Thyroid disease   . Wears dentures       Family History  Problem Relation Age of Onset  . Diabetes Mother   . Asthma Mother   . Early death Mother 53       Pneumonia  . Early death Father        Killed at rodeo  .  CAD Neg Hx   . GI Bleed Neg Hx     Social History   Social History Narrative   Lives with significant other/partner [name]   He is her caregiver   Moved from [facility]      Her Son Thalya Fouche (lives In [place]) -- ph # [phone] ( would like to be called about care issues)     Current Meds  Medication Sig  . acetaminophen (TYLENOL) 325 MG tablet Take 2 tablets (650 mg total) by mouth every 6 (six) hours as needed for mild pain or headache.  . Alcohol Swabs (B-D SINGLE USE SWABS REGULAR) PADS   . amiodarone (PACERONE) 200 MG tablet Take 1 tablet (200 mg total) by mouth daily.  Marland Kitchen amLODipine (NORVASC) 10 MG tablet Take 1 tablet (10 mg total) by mouth daily.  . Ascorbic Acid (VITAMIN C) 100 MG tablet Take 1 tablet (100 mg total) by mouth daily.  Marland Kitchen aspirin EC 81 MG EC tablet Take 1 tablet (81 mg total) by mouth daily.  . BD PEN NEEDLE MICRO U/F 32G X 6 MM MISC   . calcium acetate (PHOSLO) 667 MG capsule Take 667 mg by mouth 3 (three) times daily with meals.  . Cholecalciferol (VITAMIN D-3) 125 MCG (5000 UT) TABS Take 5,000 mg by mouth 3 (  three) times a week.   . docusate sodium (COLACE) 100 MG capsule Take 1 capsule (100 mg total) by mouth daily.  . Evolocumab (REPATHA SURECLICK) 341 MG/ML SOAJ Inject 140 mg into the skin every 14 (fourteen) days.  Marland Kitchen gabapentin (NEURONTIN) 300 MG capsule TAKE 2 CAPSULES BY MOUTH ONCE DAILY AT NIGHT  . glucose blood test strip 1 each by Other route 4 (four) times daily. Use as instructed bid. E11.65  . metoprolol succinate (TOPROL XL) 25 MG 24 hr tablet Take 0.5 tablets (12.5 mg total) by mouth daily.  . Multiple Vitamin (MULTIVITAMIN WITH MINERALS) TABS tablet Take 1 tablet by mouth daily.  . NP THYROID 120 MG tablet Take 1 tablet by mouth once daily  . pantoprazole (PROTONIX) 40 MG tablet Take 1 tablet (40 mg total) by mouth 2 (two) times daily. 30 minutes before breakfast (Patient taking differently: Take 40 mg by mouth daily before breakfast. )  . sodium  bicarbonate 650 MG tablet Take 650 mg by mouth 2 (two) times daily.  Marland Kitchen ULORIC 40 MG tablet Take 40 mg by mouth daily.   Marland Kitchen warfarin (COUMADIN) 2.5 MG tablet Take 1 1/2 tablets daily or as directed         Objective:   Today's Vitals: BP 120/60   Pulse 72   Ht 5\' 6"  (1.676 m)   Wt 168 lb 9.6 oz (76.5 kg)   BMI 27.21 kg/m  Vitals with BMI 02/09/2019 01/05/2019 12/29/2018  Height 5\' 6"  5\' 4"  -  Weight 168 lbs 10 oz 160 lbs 167 lbs 3 oz  BMI 27.23 93.79 02.40  Systolic 973 532 992  Diastolic 60 60 42  Pulse 72 83 70     Physical Exam  She looks chronically sick but appears to be quite stable and not acutely unwell.  Blood pressure is very well controlled.  In our system, she has gained 8 pounds.  She is alert and orientated without any focal neurological signs.     Assessment   1. Essential hypertension   2. Permanent atrial fibrillation   3. Hyperlipidemia associated with type 2 diabetes mellitus ([place])   4. Acquired hypothyroidism   5. Controlled type 2 diabetes mellitus with complication, without long-term current use of insulin ([place])   6. Idiopathic gout, unspecified chronicity, unspecified site   7. Mixed hyperlipidemia   8. Functional gait abnormality   9. ESRD (end stage renal disease) on dialysis [facility])      Plan: 1. She will continue with all chronic medications for chronic conditions above. 2. Blood work was ordered as below. 3. I think she will benefit from manual wheelchair and also a rolling walker.  She gets extremely fatigued and has unsteadiness of gait, especially after dialysis.  Then she tends to recover. 4. Further recommendations will depend on blood results and I will see her in 3 months time for follow-up.  Tests ordered Orders Placed This Encounter  Procedures  . DME Wheelchair manual  . Walker rolling  . Hemoglobin A1c  . T3, free  . TSH  . COMPLETE METABOLIC PANEL WITH GFR     Nimish Luther Parody, MD

## 2019-02-10 DIAGNOSIS — N186 End stage renal disease: Secondary | ICD-10-CM | POA: Diagnosis not present

## 2019-02-10 DIAGNOSIS — Z23 Encounter for immunization: Secondary | ICD-10-CM | POA: Diagnosis not present

## 2019-02-10 DIAGNOSIS — Z992 Dependence on renal dialysis: Secondary | ICD-10-CM | POA: Diagnosis not present

## 2019-02-10 LAB — COMPLETE METABOLIC PANEL WITH GFR
AG Ratio: 1.5 (calc) (ref 1.0–2.5)
ALT: 10 U/L (ref 6–29)
AST: 13 U/L (ref 10–35)
Albumin: 3.6 g/dL (ref 3.6–5.1)
Alkaline phosphatase (APISO): 84 U/L (ref 37–153)
BUN/Creatinine Ratio: 7 (calc) (ref 6–22)
BUN: 46 mg/dL — ABNORMAL HIGH (ref 7–25)
CO2: 23 mmol/L (ref 20–32)
Calcium: 8.8 mg/dL (ref 8.6–10.4)
Chloride: 95 mmol/L — ABNORMAL LOW (ref 98–110)
Creat: 6.21 mg/dL — ABNORMAL HIGH (ref 0.60–0.93)
GFR, Est African American: 7 mL/min/{1.73_m2} — ABNORMAL LOW (ref >=60)
GFR, Est Non African American: 6 mL/min/{1.73_m2} — ABNORMAL LOW (ref >=60)
Globulin: 2.4 g/dL (calc) (ref 1.9–3.7)
Glucose, Bld: 159 mg/dL — ABNORMAL HIGH (ref 65–99)
Potassium: 4.4 mmol/L (ref 3.5–5.3)
Sodium: 136 mmol/L (ref 135–146)
Total Bilirubin: 0.4 mg/dL (ref 0.2–1.2)
Total Protein: 6 g/dL — ABNORMAL LOW (ref 6.1–8.1)

## 2019-02-10 LAB — HEMOGLOBIN A1C
Hgb A1c MFr Bld: 5.6 %{Hb}
Mean Plasma Glucose: 114 (calc)
eAG (mmol/L): 6.3 (calc)

## 2019-02-10 LAB — T3, FREE: T3, Free: 3.1 pg/mL (ref 2.3–4.2)

## 2019-02-10 LAB — TSH: TSH: 1.14 mIU/L (ref 0.40–4.50)

## 2019-02-10 NOTE — Progress Notes (Signed)
Please see your results here.  You are no longer diabetic so you do not have to take any more insulin.  You have any questions, please do not hesitate to contact me.  Be well!

## 2019-02-12 DIAGNOSIS — Z992 Dependence on renal dialysis: Secondary | ICD-10-CM | POA: Diagnosis not present

## 2019-02-12 DIAGNOSIS — N186 End stage renal disease: Secondary | ICD-10-CM | POA: Diagnosis not present

## 2019-02-12 DIAGNOSIS — Z23 Encounter for immunization: Secondary | ICD-10-CM | POA: Diagnosis not present

## 2019-02-14 DIAGNOSIS — Z992 Dependence on renal dialysis: Secondary | ICD-10-CM | POA: Diagnosis not present

## 2019-02-14 DIAGNOSIS — Z23 Encounter for immunization: Secondary | ICD-10-CM | POA: Diagnosis not present

## 2019-02-14 DIAGNOSIS — N186 End stage renal disease: Secondary | ICD-10-CM | POA: Diagnosis not present

## 2019-02-17 ENCOUNTER — Other Ambulatory Visit (INDEPENDENT_AMBULATORY_CARE_PROVIDER_SITE_OTHER): Payer: Self-pay | Admitting: Internal Medicine

## 2019-02-17 DIAGNOSIS — Z23 Encounter for immunization: Secondary | ICD-10-CM | POA: Diagnosis not present

## 2019-02-17 DIAGNOSIS — N186 End stage renal disease: Secondary | ICD-10-CM | POA: Diagnosis not present

## 2019-02-17 DIAGNOSIS — Z992 Dependence on renal dialysis: Secondary | ICD-10-CM | POA: Diagnosis not present

## 2019-02-19 DIAGNOSIS — Z23 Encounter for immunization: Secondary | ICD-10-CM | POA: Diagnosis not present

## 2019-02-19 DIAGNOSIS — N186 End stage renal disease: Secondary | ICD-10-CM | POA: Diagnosis not present

## 2019-02-19 DIAGNOSIS — E119 Type 2 diabetes mellitus without complications: Secondary | ICD-10-CM | POA: Diagnosis not present

## 2019-02-19 DIAGNOSIS — E785 Hyperlipidemia, unspecified: Secondary | ICD-10-CM | POA: Diagnosis not present

## 2019-02-19 DIAGNOSIS — Z992 Dependence on renal dialysis: Secondary | ICD-10-CM | POA: Diagnosis not present

## 2019-02-21 DIAGNOSIS — Z992 Dependence on renal dialysis: Secondary | ICD-10-CM | POA: Diagnosis not present

## 2019-02-21 DIAGNOSIS — N186 End stage renal disease: Secondary | ICD-10-CM | POA: Diagnosis not present

## 2019-02-21 DIAGNOSIS — Z23 Encounter for immunization: Secondary | ICD-10-CM | POA: Diagnosis not present

## 2019-02-24 DIAGNOSIS — N186 End stage renal disease: Secondary | ICD-10-CM | POA: Diagnosis not present

## 2019-02-24 DIAGNOSIS — Z23 Encounter for immunization: Secondary | ICD-10-CM | POA: Diagnosis not present

## 2019-02-24 DIAGNOSIS — Z992 Dependence on renal dialysis: Secondary | ICD-10-CM | POA: Diagnosis not present

## 2019-02-24 DIAGNOSIS — E119 Type 2 diabetes mellitus without complications: Secondary | ICD-10-CM | POA: Diagnosis not present

## 2019-02-24 DIAGNOSIS — E785 Hyperlipidemia, unspecified: Secondary | ICD-10-CM | POA: Diagnosis not present

## 2019-02-25 ENCOUNTER — Other Ambulatory Visit (INDEPENDENT_AMBULATORY_CARE_PROVIDER_SITE_OTHER): Payer: Self-pay | Admitting: Internal Medicine

## 2019-02-25 ENCOUNTER — Telehealth (INDEPENDENT_AMBULATORY_CARE_PROVIDER_SITE_OTHER): Payer: Self-pay

## 2019-02-25 DIAGNOSIS — Z992 Dependence on renal dialysis: Secondary | ICD-10-CM | POA: Diagnosis not present

## 2019-02-25 DIAGNOSIS — N184 Chronic kidney disease, stage 4 (severe): Secondary | ICD-10-CM

## 2019-02-25 DIAGNOSIS — R2689 Other abnormalities of gait and mobility: Secondary | ICD-10-CM

## 2019-02-25 DIAGNOSIS — S72002D Fracture of unspecified part of neck of left femur, subsequent encounter for closed fracture with routine healing: Secondary | ICD-10-CM

## 2019-02-25 DIAGNOSIS — N186 End stage renal disease: Secondary | ICD-10-CM | POA: Diagnosis not present

## 2019-02-25 NOTE — Addendum Note (Signed)
Addended by: Laurence Compton on: 02/25/2019 02:39 PM   Modules accepted: Orders

## 2019-02-25 NOTE — Telephone Encounter (Signed)
Pt called office and left a message to call back.

## 2019-02-25 NOTE — Telephone Encounter (Signed)
Please call the patient and see what she wants.  Thanks

## 2019-02-26 DIAGNOSIS — Z992 Dependence on renal dialysis: Secondary | ICD-10-CM | POA: Diagnosis not present

## 2019-02-26 DIAGNOSIS — N186 End stage renal disease: Secondary | ICD-10-CM | POA: Diagnosis not present

## 2019-02-28 DIAGNOSIS — N186 End stage renal disease: Secondary | ICD-10-CM | POA: Diagnosis not present

## 2019-02-28 DIAGNOSIS — Z992 Dependence on renal dialysis: Secondary | ICD-10-CM | POA: Diagnosis not present

## 2019-03-02 DIAGNOSIS — N186 End stage renal disease: Secondary | ICD-10-CM | POA: Diagnosis not present

## 2019-03-02 DIAGNOSIS — Z992 Dependence on renal dialysis: Secondary | ICD-10-CM | POA: Diagnosis not present

## 2019-03-03 DIAGNOSIS — I10 Essential (primary) hypertension: Secondary | ICD-10-CM | POA: Diagnosis not present

## 2019-03-03 DIAGNOSIS — N186 End stage renal disease: Secondary | ICD-10-CM | POA: Diagnosis not present

## 2019-03-03 DIAGNOSIS — R2689 Other abnormalities of gait and mobility: Secondary | ICD-10-CM | POA: Diagnosis not present

## 2019-03-03 DIAGNOSIS — I4821 Permanent atrial fibrillation: Secondary | ICD-10-CM | POA: Diagnosis not present

## 2019-03-04 DIAGNOSIS — Z79899 Other long term (current) drug therapy: Secondary | ICD-10-CM | POA: Diagnosis not present

## 2019-03-04 DIAGNOSIS — N186 End stage renal disease: Secondary | ICD-10-CM | POA: Diagnosis not present

## 2019-03-04 DIAGNOSIS — E7800 Pure hypercholesterolemia, unspecified: Secondary | ICD-10-CM | POA: Diagnosis not present

## 2019-03-04 DIAGNOSIS — Z992 Dependence on renal dialysis: Secondary | ICD-10-CM | POA: Diagnosis not present

## 2019-03-04 NOTE — Addendum Note (Signed)
Addended by: Doree Albee on: 03/04/2019 09:22 AM   Modules accepted: Orders

## 2019-03-06 DIAGNOSIS — N186 End stage renal disease: Secondary | ICD-10-CM | POA: Diagnosis not present

## 2019-03-06 DIAGNOSIS — Z992 Dependence on renal dialysis: Secondary | ICD-10-CM | POA: Diagnosis not present

## 2019-03-09 ENCOUNTER — Ambulatory Visit (INDEPENDENT_AMBULATORY_CARE_PROVIDER_SITE_OTHER): Payer: Medicare HMO | Admitting: *Deleted

## 2019-03-09 ENCOUNTER — Other Ambulatory Visit: Payer: Self-pay

## 2019-03-09 DIAGNOSIS — I480 Paroxysmal atrial fibrillation: Secondary | ICD-10-CM | POA: Diagnosis not present

## 2019-03-09 DIAGNOSIS — Z5181 Encounter for therapeutic drug level monitoring: Secondary | ICD-10-CM

## 2019-03-09 DIAGNOSIS — Z992 Dependence on renal dialysis: Secondary | ICD-10-CM | POA: Diagnosis not present

## 2019-03-09 DIAGNOSIS — N186 End stage renal disease: Secondary | ICD-10-CM | POA: Diagnosis not present

## 2019-03-09 LAB — POCT INR: INR: 0.9 — AB (ref 2.0–3.0)

## 2019-03-09 NOTE — Patient Instructions (Signed)
Take coumadin 2 (10 mg) tablets today and tomorrow then resume 1 1/2 tablets (7.5mg ) daily except 1 tablet (5mg ) on Tuesdays and Fridays Recheck in 1 week in the office.

## 2019-03-11 DIAGNOSIS — Z992 Dependence on renal dialysis: Secondary | ICD-10-CM | POA: Diagnosis not present

## 2019-03-11 DIAGNOSIS — N186 End stage renal disease: Secondary | ICD-10-CM | POA: Diagnosis not present

## 2019-03-13 ENCOUNTER — Telehealth: Payer: Self-pay | Admitting: Internal Medicine

## 2019-03-13 ENCOUNTER — Telehealth: Payer: Self-pay | Admitting: Cardiology

## 2019-03-13 DIAGNOSIS — N186 End stage renal disease: Secondary | ICD-10-CM | POA: Diagnosis not present

## 2019-03-13 DIAGNOSIS — Z992 Dependence on renal dialysis: Secondary | ICD-10-CM | POA: Diagnosis not present

## 2019-03-13 NOTE — Telephone Encounter (Addendum)
Pt called coumadin clinic this afternoon while she was at dialysis and she was concerned because her HD line started to clot. While pt was on the phone pt asked staff at dialysis who stated that they are not able to check an INR. Then I spoke with staff member from dialysis who stated she did not instruct the pt to call and everything was okay. Informed pt again to follow dosing instructions from the 10/12 Anti-Coag encounter and to take 7.5mg  of Coumadin on 10/19. Pt stated she has not missed any doses and has been following the instructions. She will get her INR checked on 03/17/2019.

## 2019-03-13 NOTE — Telephone Encounter (Signed)
Encounter not needed

## 2019-03-13 NOTE — Telephone Encounter (Signed)
Started new dose of Coumadin last week - she was at dialysis this week and they had to stop due to line clotting during dialysis She would like to know if she needs to increase coumadin due to this.

## 2019-03-13 NOTE — Telephone Encounter (Addendum)
Pt called and was concerned because her line at dialysis clotted on Wednesday. She stated she has not missed any doses of Coumadin since she had her INR checked on 10/12. Informed her to continue following her dosing instructions from 10/12 and go to her follow up appointment on 10/20.

## 2019-03-16 ENCOUNTER — Other Ambulatory Visit (INDEPENDENT_AMBULATORY_CARE_PROVIDER_SITE_OTHER): Payer: Self-pay | Admitting: Internal Medicine

## 2019-03-16 ENCOUNTER — Ambulatory Visit: Payer: Medicare HMO | Admitting: Internal Medicine

## 2019-03-16 DIAGNOSIS — Z992 Dependence on renal dialysis: Secondary | ICD-10-CM | POA: Diagnosis not present

## 2019-03-16 DIAGNOSIS — N186 End stage renal disease: Secondary | ICD-10-CM | POA: Diagnosis not present

## 2019-03-17 ENCOUNTER — Other Ambulatory Visit: Payer: Self-pay

## 2019-03-17 ENCOUNTER — Ambulatory Visit (INDEPENDENT_AMBULATORY_CARE_PROVIDER_SITE_OTHER): Payer: Medicare HMO | Admitting: *Deleted

## 2019-03-17 DIAGNOSIS — Z5181 Encounter for therapeutic drug level monitoring: Secondary | ICD-10-CM

## 2019-03-17 DIAGNOSIS — I480 Paroxysmal atrial fibrillation: Secondary | ICD-10-CM

## 2019-03-17 LAB — POCT INR: INR: 1.5 — AB (ref 2.0–3.0)

## 2019-03-17 NOTE — Patient Instructions (Signed)
Took coumadin 1 1/2 tablets this morning. Take another 1/2 tablet today and take 2 tablets tomorrow then resume 1 1/2 tablets (7.5mg ) daily except 1 tablet (5mg ) on Tuesdays and Fridays Recheck in 1 week in the office.

## 2019-03-18 DIAGNOSIS — N186 End stage renal disease: Secondary | ICD-10-CM | POA: Diagnosis not present

## 2019-03-18 DIAGNOSIS — Z992 Dependence on renal dialysis: Secondary | ICD-10-CM | POA: Diagnosis not present

## 2019-03-20 DIAGNOSIS — Z992 Dependence on renal dialysis: Secondary | ICD-10-CM | POA: Diagnosis not present

## 2019-03-20 DIAGNOSIS — N186 End stage renal disease: Secondary | ICD-10-CM | POA: Diagnosis not present

## 2019-03-23 DIAGNOSIS — N186 End stage renal disease: Secondary | ICD-10-CM | POA: Diagnosis not present

## 2019-03-23 DIAGNOSIS — Z992 Dependence on renal dialysis: Secondary | ICD-10-CM | POA: Diagnosis not present

## 2019-03-24 ENCOUNTER — Other Ambulatory Visit: Payer: Self-pay

## 2019-03-24 ENCOUNTER — Ambulatory Visit (INDEPENDENT_AMBULATORY_CARE_PROVIDER_SITE_OTHER): Payer: Medicare HMO | Admitting: *Deleted

## 2019-03-24 DIAGNOSIS — I480 Paroxysmal atrial fibrillation: Secondary | ICD-10-CM | POA: Diagnosis not present

## 2019-03-24 DIAGNOSIS — Z5181 Encounter for therapeutic drug level monitoring: Secondary | ICD-10-CM | POA: Diagnosis not present

## 2019-03-24 LAB — POCT INR: INR: 3 (ref 2.0–3.0)

## 2019-03-24 NOTE — Patient Instructions (Signed)
Continue warfarin 1 1/2 tablets (7.5mg ) daily except 1 tablet (5mg ) on Tuesdays and Fridays Recheck in 10 days.

## 2019-03-25 DIAGNOSIS — N186 End stage renal disease: Secondary | ICD-10-CM | POA: Diagnosis not present

## 2019-03-25 DIAGNOSIS — Z992 Dependence on renal dialysis: Secondary | ICD-10-CM | POA: Diagnosis not present

## 2019-03-27 DIAGNOSIS — N186 End stage renal disease: Secondary | ICD-10-CM | POA: Diagnosis not present

## 2019-03-27 DIAGNOSIS — Z992 Dependence on renal dialysis: Secondary | ICD-10-CM | POA: Diagnosis not present

## 2019-03-28 DIAGNOSIS — N186 End stage renal disease: Secondary | ICD-10-CM | POA: Diagnosis not present

## 2019-03-28 DIAGNOSIS — Z992 Dependence on renal dialysis: Secondary | ICD-10-CM | POA: Diagnosis not present

## 2019-03-30 ENCOUNTER — Telehealth (HOSPITAL_COMMUNITY): Payer: Self-pay | Admitting: *Deleted

## 2019-03-30 DIAGNOSIS — Z992 Dependence on renal dialysis: Secondary | ICD-10-CM | POA: Diagnosis not present

## 2019-03-30 DIAGNOSIS — N186 End stage renal disease: Secondary | ICD-10-CM | POA: Diagnosis not present

## 2019-04-01 DIAGNOSIS — N186 End stage renal disease: Secondary | ICD-10-CM | POA: Diagnosis not present

## 2019-04-01 DIAGNOSIS — Z992 Dependence on renal dialysis: Secondary | ICD-10-CM | POA: Diagnosis not present

## 2019-04-02 ENCOUNTER — Telehealth (INDEPENDENT_AMBULATORY_CARE_PROVIDER_SITE_OTHER): Payer: Self-pay

## 2019-04-02 ENCOUNTER — Ambulatory Visit (INDEPENDENT_AMBULATORY_CARE_PROVIDER_SITE_OTHER): Payer: Medicare HMO | Admitting: *Deleted

## 2019-04-02 ENCOUNTER — Other Ambulatory Visit: Payer: Self-pay

## 2019-04-02 DIAGNOSIS — I480 Paroxysmal atrial fibrillation: Secondary | ICD-10-CM

## 2019-04-02 DIAGNOSIS — Z5181 Encounter for therapeutic drug level monitoring: Secondary | ICD-10-CM

## 2019-04-02 LAB — POCT INR: INR: 3.2 — AB (ref 2.0–3.0)

## 2019-04-02 NOTE — Patient Instructions (Signed)
Decrease warfarin to 1 1/2 tablets (7.5mg ) daily except 1 tablet (5mg ) on Tuesdays, Thursdays and Saturdays Recheck in 2 weeks

## 2019-04-02 NOTE — Telephone Encounter (Signed)
[name] was called back by provider

## 2019-04-03 DIAGNOSIS — N186 End stage renal disease: Secondary | ICD-10-CM | POA: Diagnosis not present

## 2019-04-03 DIAGNOSIS — R2689 Other abnormalities of gait and mobility: Secondary | ICD-10-CM | POA: Diagnosis not present

## 2019-04-03 DIAGNOSIS — I10 Essential (primary) hypertension: Secondary | ICD-10-CM | POA: Diagnosis not present

## 2019-04-03 DIAGNOSIS — I4821 Permanent atrial fibrillation: Secondary | ICD-10-CM | POA: Diagnosis not present

## 2019-04-03 DIAGNOSIS — Z992 Dependence on renal dialysis: Secondary | ICD-10-CM | POA: Diagnosis not present

## 2019-04-06 ENCOUNTER — Telehealth (INDEPENDENT_AMBULATORY_CARE_PROVIDER_SITE_OTHER): Payer: Self-pay

## 2019-04-06 DIAGNOSIS — Z992 Dependence on renal dialysis: Secondary | ICD-10-CM | POA: Diagnosis not present

## 2019-04-06 DIAGNOSIS — N186 End stage renal disease: Secondary | ICD-10-CM | POA: Diagnosis not present

## 2019-04-08 DIAGNOSIS — Z992 Dependence on renal dialysis: Secondary | ICD-10-CM | POA: Diagnosis not present

## 2019-04-08 DIAGNOSIS — N186 End stage renal disease: Secondary | ICD-10-CM | POA: Diagnosis not present

## 2019-04-08 NOTE — Telephone Encounter (Signed)
completed

## 2019-04-10 DIAGNOSIS — Z992 Dependence on renal dialysis: Secondary | ICD-10-CM | POA: Diagnosis not present

## 2019-04-10 DIAGNOSIS — N186 End stage renal disease: Secondary | ICD-10-CM | POA: Diagnosis not present

## 2019-04-13 ENCOUNTER — Encounter (HOSPITAL_COMMUNITY): Payer: Self-pay

## 2019-04-13 ENCOUNTER — Other Ambulatory Visit: Payer: Self-pay

## 2019-04-13 ENCOUNTER — Emergency Department (HOSPITAL_COMMUNITY)
Admission: EM | Admit: 2019-04-13 | Discharge: 2019-04-13 | Disposition: A | Discharge status: Home / Self Care | Payer: Medicare HMO | Attending: Emergency Medicine | Admitting: Emergency Medicine

## 2019-04-13 DIAGNOSIS — Z96642 Presence of left artificial hip joint: Secondary | ICD-10-CM | POA: Diagnosis not present

## 2019-04-13 DIAGNOSIS — Z794 Long term (current) use of insulin: Secondary | ICD-10-CM | POA: Diagnosis not present

## 2019-04-13 DIAGNOSIS — N183 Chronic kidney disease, stage 3 unspecified: Secondary | ICD-10-CM | POA: Insufficient documentation

## 2019-04-13 DIAGNOSIS — I4891 Unspecified atrial fibrillation: Secondary | ICD-10-CM | POA: Insufficient documentation

## 2019-04-13 DIAGNOSIS — Z79899 Other long term (current) drug therapy: Secondary | ICD-10-CM | POA: Insufficient documentation

## 2019-04-13 DIAGNOSIS — E1122 Type 2 diabetes mellitus with diabetic chronic kidney disease: Secondary | ICD-10-CM | POA: Insufficient documentation

## 2019-04-13 DIAGNOSIS — I959 Hypotension, unspecified: Secondary | ICD-10-CM | POA: Diagnosis not present

## 2019-04-13 DIAGNOSIS — R110 Nausea: Secondary | ICD-10-CM | POA: Diagnosis not present

## 2019-04-13 DIAGNOSIS — Z7982 Long term (current) use of aspirin: Secondary | ICD-10-CM | POA: Diagnosis not present

## 2019-04-13 DIAGNOSIS — I130 Hypertensive heart and chronic kidney disease with heart failure and stage 1 through stage 4 chronic kidney disease, or unspecified chronic kidney disease: Secondary | ICD-10-CM | POA: Insufficient documentation

## 2019-04-13 DIAGNOSIS — E039 Hypothyroidism, unspecified: Secondary | ICD-10-CM | POA: Diagnosis not present

## 2019-04-13 DIAGNOSIS — I951 Orthostatic hypotension: Secondary | ICD-10-CM | POA: Diagnosis not present

## 2019-04-13 DIAGNOSIS — Z951 Presence of aortocoronary bypass graft: Secondary | ICD-10-CM | POA: Insufficient documentation

## 2019-04-13 DIAGNOSIS — R42 Dizziness and giddiness: Secondary | ICD-10-CM | POA: Diagnosis not present

## 2019-04-13 DIAGNOSIS — I259 Chronic ischemic heart disease, unspecified: Secondary | ICD-10-CM | POA: Diagnosis not present

## 2019-04-13 DIAGNOSIS — I5040 Unspecified combined systolic (congestive) and diastolic (congestive) heart failure: Secondary | ICD-10-CM | POA: Insufficient documentation

## 2019-04-13 DIAGNOSIS — N186 End stage renal disease: Secondary | ICD-10-CM | POA: Diagnosis not present

## 2019-04-13 DIAGNOSIS — R55 Syncope and collapse: Secondary | ICD-10-CM | POA: Diagnosis not present

## 2019-04-13 DIAGNOSIS — Z7901 Long term (current) use of anticoagulants: Secondary | ICD-10-CM | POA: Insufficient documentation

## 2019-04-13 DIAGNOSIS — Z992 Dependence on renal dialysis: Secondary | ICD-10-CM | POA: Diagnosis not present

## 2019-04-13 LAB — COMPREHENSIVE METABOLIC PANEL
ALT: 15 U/L (ref 0–44)
AST: 19 U/L (ref 15–41)
Albumin: 4 g/dL (ref 3.5–5.0)
Alkaline Phosphatase: 103 U/L (ref 38–126)
Anion gap: 16 — ABNORMAL HIGH (ref 5–15)
BUN: 25 mg/dL — ABNORMAL HIGH (ref 8–23)
CO2: 26 mmol/L (ref 22–32)
Calcium: 8.6 mg/dL — ABNORMAL LOW (ref 8.9–10.3)
Chloride: 91 mmol/L — ABNORMAL LOW (ref 98–111)
Creatinine, Ser: 3.77 mg/dL — ABNORMAL HIGH (ref 0.44–1.00)
GFR calc Af Amer: 12 mL/min — ABNORMAL LOW (ref >60)
GFR calc non Af Amer: 11 mL/min — ABNORMAL LOW (ref >60)
Glucose, Bld: 175 mg/dL — ABNORMAL HIGH (ref 70–99)
Potassium: 3.2 mmol/L — ABNORMAL LOW (ref 3.5–5.1)
Sodium: 133 mmol/L — ABNORMAL LOW (ref 135–145)
Total Bilirubin: 0.6 mg/dL (ref 0.3–1.2)
Total Protein: 7.6 g/dL (ref 6.5–8.1)

## 2019-04-13 LAB — CBC WITH DIFFERENTIAL/PLATELET
Abs Immature Granulocytes: 0.02 10*3/uL (ref 0.00–0.07)
Basophils Absolute: 0.1 10*3/uL (ref 0.0–0.1)
Basophils Relative: 1 %
Eosinophils Absolute: 0.3 10*3/uL (ref 0.0–0.5)
Eosinophils Relative: 4 %
HCT: 37.9 % (ref 36.0–46.0)
Hemoglobin: 12 g/dL (ref 12.0–15.0)
Immature Granulocytes: 0 %
Lymphocytes Relative: 10 %
Lymphs Abs: 0.8 10*3/uL (ref 0.7–4.0)
MCH: 30.4 pg (ref 26.0–34.0)
MCHC: 31.7 g/dL (ref 30.0–36.0)
MCV: 95.9 fL (ref 80.0–100.0)
Monocytes Absolute: 0.6 10*3/uL (ref 0.1–1.0)
Monocytes Relative: 7 %
Neutro Abs: 6.7 10*3/uL (ref 1.7–7.7)
Neutrophils Relative %: 78 %
Platelets: 334 10*3/uL (ref 150–400)
RBC: 3.95 MIL/uL (ref 3.87–5.11)
RDW: 16.1 % — ABNORMAL HIGH (ref 11.5–15.5)
WBC: 8.5 10*3/uL (ref 4.0–10.5)
nRBC: 0 % (ref 0.0–0.2)

## 2019-04-13 LAB — LACTIC ACID, PLASMA: Lactic Acid, Venous: 2 mmol/L (ref 0.5–1.9)

## 2019-04-13 LAB — I-STAT CHEM 8, ED
BUN: 26 mg/dL — ABNORMAL HIGH (ref 8–23)
Calcium, Ion: 0.99 mmol/L — ABNORMAL LOW (ref 1.15–1.40)
Chloride: 96 mmol/L — ABNORMAL LOW (ref 98–111)
Creatinine, Ser: 3.8 mg/dL — ABNORMAL HIGH (ref 0.44–1.00)
Glucose, Bld: 173 mg/dL — ABNORMAL HIGH (ref 70–99)
HCT: 39 % (ref 36.0–46.0)
Hemoglobin: 13.3 g/dL (ref 12.0–15.0)
Potassium: 3.4 mmol/L — ABNORMAL LOW (ref 3.5–5.1)
Sodium: 137 mmol/L (ref 135–145)
TCO2: 28 mmol/L (ref 22–32)

## 2019-04-13 LAB — TYPE AND SCREEN
ABO/RH(D): B POS
Antibody Screen: NEGATIVE

## 2019-04-13 NOTE — ED Provider Notes (Signed)
[facility] EMERGENCY DEPARTMENT Provider Note   CSN: [id number] Arrival date & time: 04/13/19  1712     History   Chief Complaint No chief complaint on file.   HPI [name] is a 78 y.o. female who presents emergency department sent in by her dialysis clinic after feelings of presyncope and hypotension.  She states that she has a history of similar events in the past after dialysis.  Her husband was at bedside side also gives a history.  Patient got dialyzed today.  She has not missed any treatments.  She states that her weight prior to dialysis was the highest it ever been and she feels like they took a lot of fluid off.  She states that when she stood up she got very dizzy and lightheaded and felt like she needed to sit down.  She denies chest pain shortness of breath or loss of consciousness.  Her husband states that the dialysis nurse told him they could not get her blood pressure back up and so they were sending her to the ER.  Patient denies fever, chills, palpitations or vertigo.  She denies melena, hematochezia, abdominal pain.     HPI  Past Medical History:  Diagnosis Date  . Anemia   . Arthritis   . Blood transfusion without reported diagnosis   . CAD (coronary artery disease)    a. 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  . Cataract   . Chronic anemia   . Chronic combined systolic and diastolic CHF (congestive heart failure) ([place])    a. 2D echo 08/2016 at [facility]: EF 50-55% with inferior wall HK, impaired LV filling, fair study.  . CKD (chronic kidney disease), stage III   . Diabetes ([place])   . Gastritis   . GERD (gastroesophageal reflux disease)   . Gout   . Headache   . HTN (hypertension)   . Hyperlipidemia   . Hypothyroidism   . Iron deficiency anemia 11/08/2016  . Normocytic anemia 10/26/2016  . NSTEMI (non-ST elevated myocardial infarction) ([place])    a. Complex admission 08/2016 - with severe hyperglycemia, AKI on CKD, severe anemia  down to Hgb 6.8, acute combined CHF, troponin of 8.5, cath deferred due to renal dysfunction.  . Thyroid disease   . Wears dentures     Patient Active Problem List   Diagnosis Date Noted  . Atrial fibrillation ([place]) 12/03/2018  . Encounter for therapeutic drug monitoring 12/03/2018  . Pressure injury of skin 11/24/2018  . CAD (coronary artery disease) 11/21/2018  . Acute on chronic diastolic CHF (congestive heart failure) ([place]) 11/21/2018  . Respiratory failure with hypoxia ([place]) 11/21/2018  . End-stage renal disease on hemodialysis ([place]) 11/21/2018  . Diabetes mellitus type 2, controlled, with complications ([place]) 40/98/1191  . 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 ESRD (end stage renal disease) on dialysis ([place]) 11/12/2018  . Non-ST elevation (NSTEMI) myocardial infarction ([place]) 11/06/2018  . Polyp of transverse colon 10/08/2017  . Anemia 08/07/2017  . GI bleed 07/30/2017  . Hyperkalemia 07/30/2017  . Essential hypertension, benign 12/13/2016  . Hypothyroidism 12/13/2016  . Iron deficiency anemia 11/08/2016  . Normocytic anemia 10/26/2016  . Elevated troponin   . Aortic atherosclerosis ([place]) 10/25/2016  . Symptomatic anemia 10/04/2016  . History of non-ST elevation myocardial infarction (NSTEMI) 10/04/2016  . Mixed hyperlipidemia 10/04/2016  . Neuropathy 09/20/2016  . Chronic kidney disease, stage IV (severe) ([place]) 08/03/2015  . Hip fracture ([place]) 02/05/2015  . HTN (hypertension)  02/05/2015  . Gout 02/05/2015  . Left humeral fracture 02/05/2015  . Hyperlipidemia associated with type 2 diabetes mellitus ([place]) 05/01/2013    Past Surgical History:  Procedure Laterality Date  . BASCILIC VEIN TRANSPOSITION Right 06/12/2018   Procedure: FIRST STAGE BASCILIC VEIN TRANSPOSITION RIGHT ARM;  Surgeon: Rosetta Posner, MD;  Location: [place];  Service: Vascular;  Laterality: Right;  . BASCILIC VEIN TRANSPOSITION Right 08/07/2018   Procedure: BASCILIC VEIN TRANSPOSITION SECOND STAGE RIGHT ARM;   Surgeon: Rosetta Posner, MD;  Location: [place];  Service: Vascular;  Laterality: Right;  . CATARACT EXTRACTION     left  . COLONOSCOPY WITH PROPOFOL N/A 10/08/2016   5 mm transverse colon polyp note resected due to plavix. hemorrhoids  . CORONARY ARTERY BYPASS GRAFT N/A 11/20/2018   Procedure: CORONARY ARTERY BYPASS GRAFTING (CABG), ON PUMP, TIMES four, USING LEFT INTERNAL MAMMARY ARTERY AND ENDOSCOPICALLY HARVESTED RIGHT GREATER SAPHENOUS VEIN;  Surgeon: Melrose Nakayama, MD;  Location: [place];  Service: Open Heart Surgery;  Laterality: N/A;  . ENTEROSCOPY N/A 08/08/2017   Procedure: ENTEROSCOPY;  Surgeon: Daneil Dolin, MD;  [name];  Service: Endoscopy;  Laterality: N/A;  . ESOPHAGOGASTRODUODENOSCOPY N/A 10/06/2016   mild chroni gastritis, negative H.pylori  . ESOPHAGOGASTRODUODENOSCOPY (EGD) WITH PROPOFOL N/A 03/12/2017   mild edema/erythema of stomach, small bowel biopsy with focal villous tip lymphocytosis, ?partially developed celiac  . 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 GIVENS CAPSULE STUDY  10/08/2016   normal  . GIVENS CAPSULE STUDY N/A 10/29/2016   occasional gastric erosion, unremarkable small bowel  . LEFT HEART CATH AND CORONARY ANGIOGRAPHY N/A 11/12/2018   Procedure: LEFT HEART CATH AND CORONARY ANGIOGRAPHY;  Surgeon: Burnell Blanks, MD;  Location: [place] [facility];  Service: Cardiovascular;  Laterality: N/A;  . ORIF HUMERUS FRACTURE Left 02/07/2015   Procedure: OPEN REDUCTION INTERNAL FIXATION (ORIF) PROXIMAL HUMERUS FRACTURE;  Surgeon: Marybelle Killings, MD;  Location: [place];  Service: Orthopedics;  Laterality: Left;  . TEE WITHOUT CARDIOVERSION N/A 11/20/2018   Procedure: TRANSESOPHAGEAL ECHOCARDIOGRAM (TEE);  Surgeon:  Melrose Nakayama, MD;  Location: [place];  Service: Open Heart Surgery;  Laterality: N/A;  . teeth extractions    . THYROID SURGERY    . TOTAL HIP ARTHROPLASTY Left 02/07/2015   Procedure: TOTAL HIP ARTHROPLASTY ANTERIOR APPROACH ;  Surgeon: Marybelle Killings, MD;  Location: [place];  Service: Orthopedics;  Laterality: Left;  . WRIST SURGERY Left      OB History   No obstetric history on file.      Home Medications    Prior to Admission medications   Medication Sig Start Date End Date Taking? Authorizing Provider  acetaminophen (TYLENOL) 325 MG tablet Take 2 tablets (650 mg total) by mouth every 6 (six) hours as needed for mild pain or headache. 11/28/18   Nani Skillern, PA-C  Alcohol Swabs (B-D SINGLE USE SWABS REGULAR) PADS  02/26/18   [provider]  amiodarone (PACERONE) 200 MG tablet Take 1 tablet (200 mg total) by mouth daily. 01/28/19   Arnoldo Lenis, MD  amLODipine (NORVASC) 10 MG tablet Take 1 tablet (10 mg total) by mouth daily. 04/02/18   Arnoldo Lenis, MD  Ascorbic Acid (VITAMIN C) 100 MG tablet Take 1 tablet (100 mg  total) by mouth daily. 11/28/18   Nani Skillern, PA-C  aspirin EC 81 MG EC tablet Take 1 tablet (81 mg total) by mouth daily. 11/28/18   Nani Skillern, PA-C  BD PEN NEEDLE MICRO U/F 32G X 6 MM MISC  08/04/18   [provider]  calcium acetate (PHOSLO) 667 MG capsule Take 667 mg by mouth 3 (three) times daily with meals.    [provider]  Cholecalciferol (VITAMIN D-3) 125 MCG (5000 UT) TABS Take 5,000 mg by mouth 3 (three) times a week.     [provider]  docusate sodium (COLACE) 100 MG capsule Take 1 capsule (100 mg total) by mouth daily. 11/28/18   Lars Pinks M, PA-C  Evolocumab (REPATHA SURECLICK) 267 MG/ML SOAJ Inject 140 mg into the skin every 14 (fourteen) days. 01/06/19   Arnoldo Lenis, MD  febuxostat (ULORIC) 40 MG tablet Take 1 tablet by mouth once daily 02/17/19   Anastasio Champion, Nimish C, MD   gabapentin (NEURONTIN) 300 MG capsule TAKE 2 CAPSULES BY MOUTH ONCE DAILY AT NIGHT 03/17/19 04/16/19  Gosrani, Nimish C, MD  glucose blood test strip 1 each by Other route 4 (four) times daily. Use as instructed bid. E11.65 11/27/17   Cassandria Anger, MD  Insulin Glargine (TOUJEO SOLOSTAR [place]) Inject 5 Units into the skin at bedtime.    [provider]  metoprolol succinate (TOPROL XL) 25 MG 24 hr tablet Take 0.5 tablets (12.5 mg total) by mouth daily. 11/28/18 11/28/19  Nani Skillern, PA-C  Multiple Vitamin (MULTIVITAMIN WITH MINERALS) TABS tablet Take 1 tablet by mouth daily.    [provider]  NP THYROID 120 MG tablet Take 1 tablet by mouth once daily 02/01/19 03/03/19  Hurshel Party C, MD  pantoprazole (PROTONIX) 40 MG tablet Take 1 tablet (40 mg total) by mouth 2 (two) times daily. 30 minutes before breakfast Patient taking differently: Take 40 mg by mouth daily before breakfast.  11/27/17   Annitta Needs, NP  sodium bicarbonate 650 MG tablet Take 650 mg by mouth 2 (two) times daily.    [provider]  traMADol (ULTRAM) 50 MG tablet Take 1 tablet (50 mg total) by mouth every 12 (twelve) hours as needed for moderate pain. 11/28/18   Nani Skillern, PA-C  warfarin (COUMADIN) 2.5 MG tablet Take 1 1/2 tablets daily or as directed 01/05/19   Arnoldo Lenis, MD  gabapentin (NEURONTIN) 300 MG capsule Take 2 capsules (600 mg total) by mouth at bedtime. 11/18/17   Mahala Menghini, PA-C  gabapentin (NEURONTIN) 300 MG capsule TAKE 2 CAPSULES BY MOUTH ONCE DAILY AT NIGHT 01/30/19   Doree Albee, MD  NP THYROID 120 MG tablet Take 120 mg by mouth daily. 08/28/18   [provider]    Family History Family History  Problem Relation Age of Onset  . Diabetes Mother   . Asthma Mother   . Early death Mother 60       Pneumonia  . Early death Father        Killed at rodeo  . CAD Neg Hx   . GI Bleed Neg Hx     Social History Social History   Tobacco  Use  . Smoking status: Never Smoker  . Smokeless tobacco: Never Used  Substance Use Topics  . Alcohol use: No    Alcohol/week: 0.0 standard drinks  . Drug use: No     Allergies   Insulin glargine and Statins  Review of Systems Review of Systems Ten systems reviewed and are negative for acute change, except as noted in the HPI.    Physical Exam Updated Vital Signs BP (!) 116/56   Pulse 65   Temp 97.8 F (36.6 C) (Oral)   Resp 12   SpO2 100%   Physical Exam Vitals signs and nursing note reviewed.  Constitutional:      General: She is not in acute distress.    Appearance: She is well-developed. She is not diaphoretic.  HENT:     Head: Normocephalic and atraumatic.  Eyes:     General: No scleral icterus.    Conjunctiva/sclera: Conjunctivae normal.  Neck:     Musculoskeletal: Normal range of motion.  Cardiovascular:     Rate and Rhythm: Normal rate and regular rhythm.     Heart sounds: Normal heart sounds. No murmur. No friction rub. No gallop.   Pulmonary:     Effort: Pulmonary effort is normal. No respiratory distress.     Breath sounds: Normal breath sounds.  Abdominal:     General: Bowel sounds are normal. There is no distension.     Palpations: Abdomen is soft. There is no mass.     Tenderness: There is no abdominal tenderness. There is no guarding.  Skin:    General: Skin is warm and dry.  Neurological:     Mental Status: She is alert and oriented to person, place, and time.  Psychiatric:        Behavior: Behavior normal.      ED Treatments / Results  Labs (all labs ordered are listed, but only abnormal results are displayed) Labs Reviewed  COMPREHENSIVE METABOLIC PANEL - Abnormal; Notable for the following components:      Result Value   Sodium 133 (*)    Potassium 3.2 (*)    Chloride 91 (*)    Glucose, Bld 175 (*)    BUN 25 (*)    Creatinine, Ser 3.77 (*)    Calcium 8.6 (*)    GFR calc non Af Amer 11 (*)    GFR calc Af Amer 12 (*)     Anion gap 16 (*)    All other components within normal limits  CBC WITH DIFFERENTIAL/PLATELET - Abnormal; Notable for the following components:   RDW 16.1 (*)    All other components within normal limits  LACTIC ACID, PLASMA - Abnormal; Notable for the following components:   Lactic Acid, Venous 2.0 (*)    All other components within normal limits  I-STAT CHEM 8, ED - Abnormal; Notable for the following components:   Potassium 3.4 (*)    Chloride 96 (*)    BUN 26 (*)    Creatinine, Ser 3.80 (*)    Glucose, Bld 173 (*)    Calcium, Ion 0.99 (*)    All other components within normal limits  TYPE AND SCREEN    EKG EKG Interpretation  Date/Time:  Monday April 13 2019 17:38:49 EST Ventricular Rate:  72 PR Interval:    QRS Duration: 136 QT Interval:  465 QTC Calculation: 509 R Axis:   123 Text Interpretation: Sinus rhythm Nonspecific intraventricular conduction delay Confirmed by Milton Ferguson [phone]) on 04/13/2019 8:07:35 PM   Radiology No results found.  Procedures Procedures (including critical care time)  Medications Ordered in ED Medications - No data to display   Initial Impression / Assessment and Plan / ED Course  I have reviewed the triage vital signs and the nursing notes.  Pertinent labs & imaging results that were available during my care of the patient were reviewed by me and considered in my medical decision making (see chart for details).  Clinical Course as of Apr 13 2023  Mon Apr 13, 2019  1959 Hemoglobin: 12.0 [AH]    Clinical Course User Index [AH] Margarita Mail, PA-C       [name] is a 78 y.o. female who presents to ED for presyncope and hypotenstion+ evaluation of presyncopal episode just prior to arrival.   I personally reviewed the patient's labs which show that her chemistry panel is at baseline with baseline creatinine and BUN.  She has mildly elevated blood glucose of insignificant value in her work-up.  Patient does have an  anion gap of 16 with a lactic acidosis of two-point no barely elevated and likely secondary to some dehydration after dialysis.  CBC shows no elevated white blood cell count, her hemoglobin is normal and I doubt any GI bleed as a cause of her presyncopal event.  She has negative orthostatic vital signs.  Patient given oral fluids for rehydration.   On exam, patient is afebrile, hemodynamically stable with no focal neuro deficits and benign cardiopulmonary exam. EKG unchanged from previous and shows normal sinus rhythm at a rate of 72.. . Patient with  no personal or family history of sudden, young cardiac death.  No complaints of chest pain or shortness of breath.   Evaluation does not show pathology that would require ongoing emergent intervention or inpatient treatment. Will discharge to home with close PCP follow up. Reasons to return to ER were discussed.   Pt has remained hemodynamically stable throughout their time in the ED.   BP (!) 116/56   Pulse 65   Temp 97.8 F (36.6 C) (Oral)   Resp 12   SpO2 100%   Patient understands return precautions and follow up care. All questions answered.    Final Clinical Impressions(s) / ED Diagnoses   Final diagnoses:  Postural dizziness with presyncope    ED Discharge Orders    None       Margarita Mail, PA-C 04/13/19 2024    Milton Ferguson, MD 04/13/19 2317

## 2019-04-13 NOTE — ED Triage Notes (Signed)
EMS reports pt coming from [place].  Reports she had several syncopal episodes during dialysis today.  Last bp with ems was 88/48 lying.  HR 67.  CBG 172, o2 sat 99% on room air.  Pt says feels better.  Reports c/o feeling very dizzy, lightheaded, and nauseated prior to ems arrival.

## 2019-04-13 NOTE — ED Notes (Signed)
Pt states she does not make urine due to dialysis

## 2019-04-13 NOTE — ED Notes (Signed)
Family at bedside. 

## 2019-04-13 NOTE — ED Notes (Signed)
CRITICAL VALUE ALERT  Critical Value:  Lactic Acid 2.0  Date & Time Notied:  04/13/19 1831  Provider Notified: Margarita Mail, PA-C  Orders Received/Actions taken: No new orders at present time

## 2019-04-13 NOTE — Discharge Instructions (Addendum)
Get help right away if you: Have a seizure. Have unusual pain in your chest, abdomen, or back. Faint once or repeatedly. Have a severe headache. Are bleeding from your mouth or rectum, or you have black or tarry stool. Have a very fast or irregular heartbeat (palpitations). Are confused. Have trouble walking. Have severe weakness. Have vision problems. 

## 2019-04-13 NOTE — ED Notes (Signed)
Patient denies pain and is resting comfortably.  

## 2019-04-15 DIAGNOSIS — Z992 Dependence on renal dialysis: Secondary | ICD-10-CM | POA: Diagnosis not present

## 2019-04-15 DIAGNOSIS — N186 End stage renal disease: Secondary | ICD-10-CM | POA: Diagnosis not present

## 2019-04-17 ENCOUNTER — Telehealth: Payer: Self-pay | Admitting: *Deleted

## 2019-04-17 DIAGNOSIS — Z992 Dependence on renal dialysis: Secondary | ICD-10-CM | POA: Diagnosis not present

## 2019-04-17 DIAGNOSIS — N186 End stage renal disease: Secondary | ICD-10-CM | POA: Diagnosis not present

## 2019-04-17 NOTE — Telephone Encounter (Signed)
Patient called stating that she has an appointment with you 12-1-. Want to let you know that she is going to continue taking same dosage until she sees you.

## 2019-04-20 DIAGNOSIS — N186 End stage renal disease: Secondary | ICD-10-CM | POA: Diagnosis not present

## 2019-04-20 DIAGNOSIS — Z992 Dependence on renal dialysis: Secondary | ICD-10-CM | POA: Diagnosis not present

## 2019-04-22 DIAGNOSIS — Z992 Dependence on renal dialysis: Secondary | ICD-10-CM | POA: Diagnosis not present

## 2019-04-22 DIAGNOSIS — N186 End stage renal disease: Secondary | ICD-10-CM | POA: Diagnosis not present

## 2019-04-24 DIAGNOSIS — N186 End stage renal disease: Secondary | ICD-10-CM | POA: Diagnosis not present

## 2019-04-24 DIAGNOSIS — Z992 Dependence on renal dialysis: Secondary | ICD-10-CM | POA: Diagnosis not present

## 2019-04-27 ENCOUNTER — Telehealth: Payer: Self-pay | Admitting: Internal Medicine

## 2019-04-27 DIAGNOSIS — E782 Mixed hyperlipidemia: Secondary | ICD-10-CM

## 2019-04-27 DIAGNOSIS — Z992 Dependence on renal dialysis: Secondary | ICD-10-CM | POA: Diagnosis not present

## 2019-04-27 DIAGNOSIS — N186 End stage renal disease: Secondary | ICD-10-CM | POA: Diagnosis not present

## 2019-04-27 DIAGNOSIS — Z951 Presence of aortocoronary bypass graft: Secondary | ICD-10-CM

## 2019-04-27 NOTE — Telephone Encounter (Signed)
Does patient need any lab work? Thanks!

## 2019-04-27 NOTE — Telephone Encounter (Signed)
New Message:   Pt is seeing Dr Debara Pickett for Lipid Clinic tomorrow. Pt wants to know if she need lab work before her appointment tomorrow?

## 2019-04-27 NOTE — Telephone Encounter (Signed)
Lipid panel & direct LDL ordered STAT LM for patient to see if labs can be down at 04/28/2019 INR check at [facility] office

## 2019-04-28 ENCOUNTER — Ambulatory Visit (INDEPENDENT_AMBULATORY_CARE_PROVIDER_SITE_OTHER): Payer: Medicare HMO | Admitting: Internal Medicine

## 2019-04-28 ENCOUNTER — Encounter: Payer: Self-pay | Admitting: Internal Medicine

## 2019-04-28 ENCOUNTER — Ambulatory Visit (INDEPENDENT_AMBULATORY_CARE_PROVIDER_SITE_OTHER): Payer: Medicare HMO | Admitting: *Deleted

## 2019-04-28 ENCOUNTER — Other Ambulatory Visit (HOSPITAL_COMMUNITY)
Admission: RE | Admit: 2019-04-28 | Discharge: 2019-04-28 | Disposition: A | Discharge status: Home / Self Care | Payer: Medicare HMO | Source: Ambulatory Visit | Attending: Internal Medicine | Admitting: Internal Medicine

## 2019-04-28 ENCOUNTER — Other Ambulatory Visit: Payer: Self-pay

## 2019-04-28 VITALS — BP 141/59 | HR 72 | Ht 64.0 in | Wt 171.4 lb

## 2019-04-28 DIAGNOSIS — E782 Mixed hyperlipidemia: Secondary | ICD-10-CM | POA: Insufficient documentation

## 2019-04-28 DIAGNOSIS — N186 End stage renal disease: Secondary | ICD-10-CM | POA: Diagnosis not present

## 2019-04-28 DIAGNOSIS — Z5181 Encounter for therapeutic drug level monitoring: Secondary | ICD-10-CM | POA: Diagnosis not present

## 2019-04-28 DIAGNOSIS — Z951 Presence of aortocoronary bypass graft: Secondary | ICD-10-CM | POA: Diagnosis not present

## 2019-04-28 DIAGNOSIS — I4821 Permanent atrial fibrillation: Secondary | ICD-10-CM | POA: Diagnosis not present

## 2019-04-28 DIAGNOSIS — E7849 Other hyperlipidemia: Secondary | ICD-10-CM

## 2019-04-28 DIAGNOSIS — I480 Paroxysmal atrial fibrillation: Secondary | ICD-10-CM | POA: Diagnosis not present

## 2019-04-28 DIAGNOSIS — Z992 Dependence on renal dialysis: Secondary | ICD-10-CM | POA: Diagnosis not present

## 2019-04-28 LAB — LIPID PANEL
Cholesterol: 340 mg/dL — ABNORMAL HIGH (ref 0–200)
HDL: 48 mg/dL (ref >40)
LDL Cholesterol: 217 mg/dL — ABNORMAL HIGH (ref 0–99)
Total CHOL/HDL Ratio: 7.1 RATIO
Triglycerides: 373 mg/dL — ABNORMAL HIGH (ref <150)
VLDL: 75 mg/dL — ABNORMAL HIGH (ref 0–40)

## 2019-04-28 LAB — POCT INR: INR: 2 (ref 2.0–3.0)

## 2019-04-28 LAB — LDL CHOLESTEROL, DIRECT: Direct LDL: 215.4 mg/dL — ABNORMAL HIGH (ref 0–99)

## 2019-04-28 NOTE — Patient Instructions (Signed)
Continue warfarin 1 1/2 tablets (7.5mg ) daily except 1 tablet (5mg ) on Tuesdays, Thursdays and Saturdays Recheck in 3 weeks

## 2019-04-28 NOTE — Progress Notes (Signed)
LIPID CLINIC CONSULT NOTE  Chief Complaint:  Follow-up dyslipidemia  Primary Care Physician: Doree Albee, MD  Primary Cardiologist:  Carlyle Dolly, MD  HPI:  [name] is a 78 y.o. female who is being seen today for the evaluation of dyslipidemia at the request of Erma Heritage, [place]*.  This is a pleasant 78 year old female I met initially in the hospital and she underwent coronary artery bypass grafting x4 in July 2020.  At that time she had marked dyslipidemia with total cholesterol greater than 500 and LDL greater than 300.  She had unfortunately been statin intolerant causing disabling symptoms.  Subsequently in follow-up she has been seen by Dr. Harl Bowie as well as Bernerd Pho, PA-C, who referred her to the lipid clinic for PCSK9 inhibitor therapy.  She was started on Repatha by Nehemiah Massed, PharmD.  She says this medicine is "wonderful".  Based on that though she has had significant improvement in her lipids although her cholesterol remains quite high.  Total cholesterol now 340, triglycerides 373, HDL 48 and LDL 217.  She is also on dialysis which limits her treatment options.  We discussed further options for her including possibly bempedoic acid or Vascepa, but she seems hesitant at this time to add additional medications.  PMHx:  Past Medical History:  Diagnosis Date  . Anemia   . Arthritis   . Blood transfusion without reported diagnosis   . CAD (coronary artery disease)    a. 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  . Cataract   . Chronic anemia   . Chronic combined systolic and diastolic CHF (congestive heart failure) ([place])    a. 2D echo 08/2016 at [facility]: EF 50-55% with inferior wall HK, impaired LV filling, fair study.  . CKD (chronic kidney disease), stage III   . Diabetes ([place])   . Gastritis   . GERD (gastroesophageal reflux disease)   . Gout   . Headache   . HTN (hypertension)   . Hyperlipidemia   .  Hypothyroidism   . Iron deficiency anemia 11/08/2016  . Normocytic anemia 10/26/2016  . NSTEMI (non-ST elevated myocardial infarction) ([place])    a. Complex admission 08/2016 - with severe hyperglycemia, AKI on CKD, severe anemia down to Hgb 6.8, acute combined CHF, troponin of 8.5, cath deferred due to renal dysfunction.  . Thyroid disease   . Wears dentures     Past Surgical History:  Procedure Laterality Date  . BASCILIC VEIN TRANSPOSITION Right 06/12/2018   Procedure: FIRST STAGE BASCILIC VEIN TRANSPOSITION RIGHT ARM;  Surgeon: Rosetta Posner, MD;  Location: [place];  Service: Vascular;  Laterality: Right;  . BASCILIC VEIN TRANSPOSITION Right 08/07/2018   Procedure: BASCILIC VEIN TRANSPOSITION SECOND STAGE RIGHT ARM;  Surgeon: Rosetta Posner, MD;  Location: [place];  Service: Vascular;  Laterality: Right;  . CATARACT EXTRACTION     left  . COLONOSCOPY WITH PROPOFOL N/A 10/08/2016   5 mm transverse colon polyp note resected due to plavix. hemorrhoids  . CORONARY ARTERY BYPASS GRAFT N/A 11/20/2018   Procedure: CORONARY ARTERY BYPASS GRAFTING (CABG), ON PUMP, TIMES four, USING LEFT INTERNAL MAMMARY ARTERY AND ENDOSCOPICALLY HARVESTED RIGHT GREATER SAPHENOUS VEIN;  Surgeon: Melrose Nakayama, MD;  Location: [place];  Service: Open Heart Surgery;  Laterality: N/A;  . ENTEROSCOPY N/A 08/08/2017   Procedure: ENTEROSCOPY;  Surgeon: Daneil Dolin, MD;  [name];  Service: Endoscopy;  Laterality: N/A;  . ESOPHAGOGASTRODUODENOSCOPY N/A 10/06/2016  mild chroni gastritis, negative H.pylori  . ESOPHAGOGASTRODUODENOSCOPY (EGD) WITH PROPOFOL N/A 03/12/2017   mild edema/erythema of stomach, small bowel biopsy with focal villous tip lymphocytosis, ?partially developed celiac  . ESOPHAGOGASTRODUODENOSCOPY (EGD) WITH PROPOFOL N/A 08/08/2017   normal esophagus, small hiatal hernia, normal duodenal bulb, abnormal small bowel junction of duodenum and jejunum lwith active bleeding likely represetning  Dieulafoy lesion, s/p clips and lesion tattooed  . GIVENS CAPSULE STUDY  10/08/2016   normal  . GIVENS CAPSULE STUDY N/A 10/29/2016   occasional gastric erosion, unremarkable small bowel  . LEFT HEART CATH AND CORONARY ANGIOGRAPHY N/A 11/12/2018   Procedure: LEFT HEART CATH AND CORONARY ANGIOGRAPHY;  Surgeon: Burnell Blanks, MD;  Location: [facility];  Service: Cardiovascular;  Laterality: N/A;  . ORIF HUMERUS FRACTURE Left 02/07/2015   Procedure: OPEN REDUCTION INTERNAL FIXATION (ORIF) PROXIMAL HUMERUS FRACTURE;  Surgeon: Marybelle Killings, MD;  Location: [place];  Service: Orthopedics;  Laterality: Left;  . TEE WITHOUT CARDIOVERSION N/A 11/20/2018   Procedure: TRANSESOPHAGEAL ECHOCARDIOGRAM (TEE);  Surgeon: Melrose Nakayama, MD;  Location: [place];  Service: Open Heart Surgery;  Laterality: N/A;  . teeth extractions    . THYROID SURGERY    . TOTAL HIP ARTHROPLASTY Left 02/07/2015   Procedure: TOTAL HIP ARTHROPLASTY ANTERIOR APPROACH ;  Surgeon: Marybelle Killings, MD;  Location: [place];  Service: Orthopedics;  Laterality: Left;  . WRIST SURGERY Left     FAMHx:  Family History  Problem Relation Age of Onset  . Diabetes Mother   . Asthma Mother   . Early death Mother 46       Pneumonia  . Early death Father        Killed at rodeo  . CAD Neg Hx   . GI Bleed Neg Hx     SOCHx:   reports that she has never smoked. She has never used smokeless tobacco. She reports that she does not drink alcohol or use drugs.  ALLERGIES:  Allergies  Allergen Reactions  . Insulin Glargine Swelling    "Makes me swell like a balloon all over", including face, but without any respiratory distress or rashes. Associated with weight gain.  . Statins Other (See Comments)    "I've tried them all; my muscle aches were so bad I couldn't walk".    ROS: Pertinent items noted in HPI and remainder of comprehensive ROS otherwise negative.  HOME MEDS: Current Outpatient Medications on File Prior to Visit   Medication Sig Dispense Refill  . acetaminophen (TYLENOL) 325 MG tablet Take 2 tablets (650 mg total) by mouth every 6 (six) hours as needed for mild pain or headache.    . Alcohol Swabs (B-D SINGLE USE SWABS REGULAR) PADS     . amiodarone (PACERONE) 200 MG tablet Take 1 tablet (200 mg total) by mouth daily. 30 tablet 6  . amLODipine (NORVASC) 10 MG tablet Take 1 tablet (10 mg total) by mouth daily. 90 tablet 3  . Ascorbic Acid (VITAMIN C) 100 MG tablet Take 1 tablet (100 mg total) by mouth daily.    Marland Kitchen aspirin EC 81 MG EC tablet Take 1 tablet (81 mg total) by mouth daily.    . BD PEN NEEDLE MICRO U/F 32G X 6 MM MISC     . calcium acetate (PHOSLO) 667 MG capsule Take 667 mg by mouth 3 (three) times daily with meals.    . Cholecalciferol (VITAMIN D-3) 125 MCG (5000 UT) TABS Take 5,000 mg by mouth 3 (  three) times a week.     . Evolocumab (REPATHA SURECLICK) 696 MG/ML SOAJ Inject 140 mg into the skin every 14 (fourteen) days. 2 pen 12  . febuxostat (ULORIC) 40 MG tablet Take 1 tablet by mouth once daily 90 tablet 0  . gabapentin (NEURONTIN) 300 MG capsule TAKE 2 CAPSULES BY MOUTH ONCE DAILY AT NIGHT 60 capsule 3  . metoprolol succinate (TOPROL XL) 25 MG 24 hr tablet Take 0.5 tablets (12.5 mg total) by mouth daily. 30 tablet 11  . Multiple Vitamin (MULTIVITAMIN WITH MINERALS) TABS tablet Take 1 tablet by mouth daily.    . pantoprazole (PROTONIX) 40 MG tablet Take 1 tablet (40 mg total) by mouth 2 (two) times daily. 30 minutes before breakfast (Patient taking differently: Take 40 mg by mouth daily before breakfast. ) 180 tablet 3  . warfarin (COUMADIN) 2.5 MG tablet Take 1 1/2 tablets daily or as directed 50 tablet 3  . NP THYROID 120 MG tablet Take 1 tablet by mouth once daily 30 tablet 3  . [DISCONTINUED] gabapentin (NEURONTIN) 300 MG capsule Take 2 capsules (600 mg total) by mouth at bedtime. 180 capsule 0  . [DISCONTINUED] gabapentin (NEURONTIN) 300 MG capsule TAKE 2 CAPSULES BY MOUTH ONCE DAILY AT  NIGHT 60 capsule 0  . [DISCONTINUED] NP THYROID 120 MG tablet Take 120 mg by mouth daily.     No current facility-administered medications on file prior to visit.     LABS/IMAGING: Results for orders placed or performed during the hospital encounter of 04/28/19 (from the past 48 hour(s))  LDL cholesterol, direct     Status: Abnormal   Collection Time: 04/28/19  9:09 AM  Result Value Ref Range   Direct LDL 215.4 (H) 0 - 99 mg/dL    Comment: Performed at [facility] [address], [place], [place] [address]  Lipid panel     Status: Abnormal   Collection Time: 04/28/19  9:09 AM  Result Value Ref Range   Cholesterol 340 (H) 0 - 200 mg/dL   Triglycerides 373 (H) <150 mg/dL   HDL 48 >40 mg/dL   Total CHOL/HDL Ratio 7.1 RATIO   VLDL 75 (H) 0 - 40 mg/dL   LDL Cholesterol 217 (H) 0 - 99 mg/dL    Comment:        Total Cholesterol/HDL:CHD Risk Coronary Heart Disease Risk Table                     Men   Women  1/2 Average Risk   3.4   3.3  Average Risk       5.0   4.4  2 X Average Risk   9.6   7.1  3 X Average Risk  23.4   11.0        Use the calculated Patient Ratio above and the CHD Risk Table to determine the patient's CHD Risk.        ATP III CLASSIFICATION (LDL):  <100     mg/dL   Optimal  100-129  mg/dL   Near or Above                    Optimal  130-159  mg/dL   Borderline  160-189  mg/dL   High  >190     mg/dL   Very High Performed at [facility], [address] [place], [place] [address]    No results found.  LIPID PANEL:    Component Value Date/Time  CHOL 340 (H) 04/28/2019 0909   TRIG 373 (H) 04/28/2019 0909   HDL 48 04/28/2019 0909   CHOLHDL 7.1 04/28/2019 0909   VLDL 75 (H) 04/28/2019 0909   LDLCALC 217 (H) 04/28/2019 0909   LDLDIRECT 215.4 (H) 04/28/2019 0909    WEIGHTS: Wt Readings from Last 3 Encounters:  04/28/19 171 lb 6.4 oz (77.7 kg)  02/09/19 168 lb 9.6 oz (76.5 kg)  01/05/19 160 lb (72.6 kg)    VITALS: BP (!) 141/59   Pulse 72    Ht _0  (1.626 m)   Wt 171 lb 6.4 oz (77.7 kg)   SpO2 98%   BMI 29.42 kg/m   EXAM: General appearance: alert, no distress and morbidly obese Lungs: clear to auscultation bilaterally Heart: regular rate and rhythm Extremities: extremities normal, atraumatic, no cyanosis or edema Neurologic: Grossly normal  EKG: Deferred  ASSESSMENT: 1. Coronary artery disease status post CABG x4 in 11/2018 2. Probable familial hyperlipidemia 3. Chronic congestive heart failure 4. ESRD on HD 5. Statin intolerance  PLAN: 1.   Ms. Graham has done well on Repatha with a significant improvement in her lipids however remains well above target LDL.  Additional therapy could bring those numbers down including Vascepa and Nexletol if they were tolerated.  She wishes to work on dietary approaches and consider these options but does not want to entertain additional therapy at this time.  Plan follow-up with me in 6 months.  Pixie Casino, MD, [facility], [place] Director of the Advanced Lipid Disorders &  Cardiovascular Risk Reduction Clinic Diplomate of the American Board of Clinical Lipidology Attending Cardiologist  Direct Dial: [phone]  Fax: [phone]  Website:  [email or web address] 04/28/2019, 4:44 PM

## 2019-04-28 NOTE — Patient Instructions (Signed)
Medication Instructions:  Your physician recommends that you continue on your current medications as directed. Please refer to the Current Medication list given to you today.  *If you need a refill on your cardiac medications before your next appointment, please call your pharmacy*  Lab Work FASTING LABS IN 6 MONTHS 1 WEEK PRIOR TO YOUR FOLLOW UP   If you have labs (blood work) drawn today and your tests are completely normal, you will receive your results only by: Marland Kitchen MyChart Message (if you have MyChart) OR . A paper copy in the mail If you have any lab test that is abnormal or we need to change your treatment, we will call you to review the results.  Testing/Procedures: NONE  Follow-Up: At [facility], you and your health needs are our priority.  As part of our continuing mission to provide you with exceptional heart care, we have created designated Provider Care Teams.  These Care Teams include your primary Cardiologist (physician) and Advanced Practice Providers (APPs -  Physician Assistants and Nurse Practitioners) who all work together to provide you with the care you need, when you need it.  Your next appointment:   Dr. Debara Pickett recommends that you schedule a follow up visit with him the in the [place] in 6 months. Please have fasting blood work about 1 week prior to this visit and he will review the blood work results with you at your appointment. CALL THE OFFICE TO ARRANGE IN ABOUT 3-4 MONTHS

## 2019-04-29 ENCOUNTER — Inpatient Hospital Stay (HOSPITAL_COMMUNITY): Payer: Medicare HMO

## 2019-04-29 DIAGNOSIS — N186 End stage renal disease: Secondary | ICD-10-CM | POA: Diagnosis not present

## 2019-04-29 DIAGNOSIS — Z992 Dependence on renal dialysis: Secondary | ICD-10-CM | POA: Diagnosis not present

## 2019-04-30 ENCOUNTER — Ambulatory Visit (HOSPITAL_COMMUNITY): Payer: Medicare HMO | Admitting: Hematology

## 2019-05-01 DIAGNOSIS — Z992 Dependence on renal dialysis: Secondary | ICD-10-CM | POA: Diagnosis not present

## 2019-05-01 DIAGNOSIS — N186 End stage renal disease: Secondary | ICD-10-CM | POA: Diagnosis not present

## 2019-05-03 DIAGNOSIS — I4821 Permanent atrial fibrillation: Secondary | ICD-10-CM | POA: Diagnosis not present

## 2019-05-03 DIAGNOSIS — N186 End stage renal disease: Secondary | ICD-10-CM | POA: Diagnosis not present

## 2019-05-03 DIAGNOSIS — R2689 Other abnormalities of gait and mobility: Secondary | ICD-10-CM | POA: Diagnosis not present

## 2019-05-03 DIAGNOSIS — I10 Essential (primary) hypertension: Secondary | ICD-10-CM | POA: Diagnosis not present

## 2019-05-04 DIAGNOSIS — Z992 Dependence on renal dialysis: Secondary | ICD-10-CM | POA: Diagnosis not present

## 2019-05-04 DIAGNOSIS — N186 End stage renal disease: Secondary | ICD-10-CM | POA: Diagnosis not present

## 2019-05-06 DIAGNOSIS — Z992 Dependence on renal dialysis: Secondary | ICD-10-CM | POA: Diagnosis not present

## 2019-05-06 DIAGNOSIS — N186 End stage renal disease: Secondary | ICD-10-CM | POA: Diagnosis not present

## 2019-05-07 ENCOUNTER — Telehealth: Payer: Self-pay

## 2019-05-07 NOTE — Telephone Encounter (Signed)
lmomed the pt to please fill out the snf forms and return them to the office asap

## 2019-05-08 DIAGNOSIS — Z992 Dependence on renal dialysis: Secondary | ICD-10-CM | POA: Diagnosis not present

## 2019-05-08 DIAGNOSIS — N186 End stage renal disease: Secondary | ICD-10-CM | POA: Diagnosis not present

## 2019-05-09 ENCOUNTER — Other Ambulatory Visit: Payer: Self-pay | Admitting: Cardiology

## 2019-05-11 DIAGNOSIS — N186 End stage renal disease: Secondary | ICD-10-CM | POA: Diagnosis not present

## 2019-05-11 DIAGNOSIS — Z992 Dependence on renal dialysis: Secondary | ICD-10-CM | POA: Diagnosis not present

## 2019-05-12 NOTE — Telephone Encounter (Signed)
Rx request sent to pharmacy.  

## 2019-05-13 DIAGNOSIS — N186 End stage renal disease: Secondary | ICD-10-CM | POA: Diagnosis not present

## 2019-05-13 DIAGNOSIS — Z992 Dependence on renal dialysis: Secondary | ICD-10-CM | POA: Diagnosis not present

## 2019-05-15 DIAGNOSIS — N186 End stage renal disease: Secondary | ICD-10-CM | POA: Diagnosis not present

## 2019-05-15 DIAGNOSIS — Z992 Dependence on renal dialysis: Secondary | ICD-10-CM | POA: Diagnosis not present

## 2019-05-19 ENCOUNTER — Encounter (HOSPITAL_COMMUNITY): Payer: Self-pay

## 2019-05-19 ENCOUNTER — Inpatient Hospital Stay (HOSPITAL_COMMUNITY)
Admission: EM | Admit: 2019-05-19 | Discharge: 2019-05-23 | DRG: 177 | Disposition: A | Discharge status: Home / Self Care | Payer: Medicare HMO | Attending: Internal Medicine | Admitting: Internal Medicine

## 2019-05-19 ENCOUNTER — Emergency Department (HOSPITAL_COMMUNITY): Payer: Medicare HMO

## 2019-05-19 ENCOUNTER — Ambulatory Visit (INDEPENDENT_AMBULATORY_CARE_PROVIDER_SITE_OTHER): Payer: Medicare HMO | Admitting: Internal Medicine

## 2019-05-19 DIAGNOSIS — E782 Mixed hyperlipidemia: Secondary | ICD-10-CM | POA: Diagnosis present

## 2019-05-19 DIAGNOSIS — I4891 Unspecified atrial fibrillation: Secondary | ICD-10-CM | POA: Diagnosis present

## 2019-05-19 DIAGNOSIS — E1165 Type 2 diabetes mellitus with hyperglycemia: Secondary | ICD-10-CM | POA: Diagnosis present

## 2019-05-19 DIAGNOSIS — I132 Hypertensive heart and chronic kidney disease with heart failure and with stage 5 chronic kidney disease, or end stage renal disease: Secondary | ICD-10-CM | POA: Diagnosis present

## 2019-05-19 DIAGNOSIS — I252 Old myocardial infarction: Secondary | ICD-10-CM

## 2019-05-19 DIAGNOSIS — Z992 Dependence on renal dialysis: Secondary | ICD-10-CM | POA: Diagnosis not present

## 2019-05-19 DIAGNOSIS — E1122 Type 2 diabetes mellitus with diabetic chronic kidney disease: Secondary | ICD-10-CM | POA: Diagnosis not present

## 2019-05-19 DIAGNOSIS — N186 End stage renal disease: Secondary | ICD-10-CM | POA: Diagnosis present

## 2019-05-19 DIAGNOSIS — E875 Hyperkalemia: Secondary | ICD-10-CM | POA: Diagnosis not present

## 2019-05-19 DIAGNOSIS — I120 Hypertensive chronic kidney disease with stage 5 chronic kidney disease or end stage renal disease: Secondary | ICD-10-CM | POA: Diagnosis not present

## 2019-05-19 DIAGNOSIS — G9341 Metabolic encephalopathy: Secondary | ICD-10-CM | POA: Diagnosis present

## 2019-05-19 DIAGNOSIS — D72829 Elevated white blood cell count, unspecified: Secondary | ICD-10-CM | POA: Diagnosis present

## 2019-05-19 DIAGNOSIS — E039 Hypothyroidism, unspecified: Secondary | ICD-10-CM | POA: Diagnosis present

## 2019-05-19 DIAGNOSIS — Z79890 Hormone replacement therapy (postmenopausal): Secondary | ICD-10-CM

## 2019-05-19 DIAGNOSIS — D649 Anemia, unspecified: Secondary | ICD-10-CM | POA: Diagnosis present

## 2019-05-19 DIAGNOSIS — N2581 Secondary hyperparathyroidism of renal origin: Secondary | ICD-10-CM | POA: Diagnosis not present

## 2019-05-19 DIAGNOSIS — Z7901 Long term (current) use of anticoagulants: Secondary | ICD-10-CM

## 2019-05-19 DIAGNOSIS — J9601 Acute respiratory failure with hypoxia: Secondary | ICD-10-CM

## 2019-05-19 DIAGNOSIS — R404 Transient alteration of awareness: Secondary | ICD-10-CM | POA: Diagnosis not present

## 2019-05-19 DIAGNOSIS — K219 Gastro-esophageal reflux disease without esophagitis: Secondary | ICD-10-CM | POA: Diagnosis present

## 2019-05-19 DIAGNOSIS — I272 Pulmonary hypertension, unspecified: Secondary | ICD-10-CM | POA: Diagnosis present

## 2019-05-19 DIAGNOSIS — Z79899 Other long term (current) drug therapy: Secondary | ICD-10-CM

## 2019-05-19 DIAGNOSIS — Z96642 Presence of left artificial hip joint: Secondary | ICD-10-CM | POA: Diagnosis present

## 2019-05-19 DIAGNOSIS — Z7982 Long term (current) use of aspirin: Secondary | ICD-10-CM

## 2019-05-19 DIAGNOSIS — M109 Gout, unspecified: Secondary | ICD-10-CM | POA: Diagnosis present

## 2019-05-19 DIAGNOSIS — U071 COVID-19: Principal | ICD-10-CM | POA: Diagnosis present

## 2019-05-19 DIAGNOSIS — I5042 Chronic combined systolic (congestive) and diastolic (congestive) heart failure: Secondary | ICD-10-CM | POA: Diagnosis not present

## 2019-05-19 DIAGNOSIS — E440 Moderate protein-calorie malnutrition: Secondary | ICD-10-CM | POA: Diagnosis present

## 2019-05-19 DIAGNOSIS — N250 Renal osteodystrophy: Secondary | ICD-10-CM | POA: Diagnosis not present

## 2019-05-19 DIAGNOSIS — R410 Disorientation, unspecified: Secondary | ICD-10-CM | POA: Diagnosis not present

## 2019-05-19 DIAGNOSIS — I2510 Atherosclerotic heart disease of native coronary artery without angina pectoris: Secondary | ICD-10-CM | POA: Diagnosis present

## 2019-05-19 DIAGNOSIS — Z9115 Patient's noncompliance with renal dialysis: Secondary | ICD-10-CM

## 2019-05-19 DIAGNOSIS — E8770 Fluid overload, unspecified: Secondary | ICD-10-CM | POA: Diagnosis not present

## 2019-05-19 DIAGNOSIS — D631 Anemia in chronic kidney disease: Secondary | ICD-10-CM | POA: Diagnosis not present

## 2019-05-19 DIAGNOSIS — Z833 Family history of diabetes mellitus: Secondary | ICD-10-CM | POA: Diagnosis not present

## 2019-05-19 DIAGNOSIS — I959 Hypotension, unspecified: Secondary | ICD-10-CM | POA: Diagnosis not present

## 2019-05-19 DIAGNOSIS — Z951 Presence of aortocoronary bypass graft: Secondary | ICD-10-CM

## 2019-05-19 DIAGNOSIS — I10 Essential (primary) hypertension: Secondary | ICD-10-CM | POA: Diagnosis not present

## 2019-05-19 DIAGNOSIS — R0902 Hypoxemia: Secondary | ICD-10-CM | POA: Diagnosis not present

## 2019-05-19 DIAGNOSIS — R509 Fever, unspecified: Secondary | ICD-10-CM | POA: Diagnosis not present

## 2019-05-19 DIAGNOSIS — E8889 Other specified metabolic disorders: Secondary | ICD-10-CM | POA: Diagnosis present

## 2019-05-19 LAB — CBC WITH DIFFERENTIAL/PLATELET
Abs Immature Granulocytes: 0.1 10*3/uL — ABNORMAL HIGH (ref 0.00–0.07)
Basophils Absolute: 0 10*3/uL (ref 0.0–0.1)
Basophils Relative: 0 %
Eosinophils Absolute: 0 10*3/uL (ref 0.0–0.5)
Eosinophils Relative: 0 %
HCT: 36.7 % (ref 36.0–46.0)
Hemoglobin: 11.4 g/dL — ABNORMAL LOW (ref 12.0–15.0)
Immature Granulocytes: 1 %
Lymphocytes Relative: 6 %
Lymphs Abs: 1 10*3/uL (ref 0.7–4.0)
MCH: 30.2 pg (ref 26.0–34.0)
MCHC: 31.1 g/dL (ref 30.0–36.0)
MCV: 97.1 fL (ref 80.0–100.0)
Monocytes Absolute: 0.6 10*3/uL (ref 0.1–1.0)
Monocytes Relative: 4 %
Neutro Abs: 13.2 10*3/uL — ABNORMAL HIGH (ref 1.7–7.7)
Neutrophils Relative %: 89 %
Platelets: 324 10*3/uL (ref 150–400)
RBC: 3.78 MIL/uL — ABNORMAL LOW (ref 3.87–5.11)
RDW: 16.1 % — ABNORMAL HIGH (ref 11.5–15.5)
WBC: 14.9 10*3/uL — ABNORMAL HIGH (ref 4.0–10.5)
nRBC: 0 % (ref 0.0–0.2)

## 2019-05-19 LAB — HEMOGLOBIN A1C
Hgb A1c MFr Bld: 6.1 % — ABNORMAL HIGH (ref 4.8–5.6)
Mean Plasma Glucose: 128.37 mg/dL

## 2019-05-19 LAB — URINALYSIS, ROUTINE W REFLEX MICROSCOPIC
Bilirubin Urine: NEGATIVE
Glucose, UA: NEGATIVE mg/dL
Hgb urine dipstick: NEGATIVE
Ketones, ur: NEGATIVE mg/dL
Leukocytes,Ua: NEGATIVE
Nitrite: NEGATIVE
Protein, ur: 100 mg/dL — AB
Specific Gravity, Urine: 1.016 (ref 1.005–1.030)
pH: 5 (ref 5.0–8.0)

## 2019-05-19 LAB — COMPREHENSIVE METABOLIC PANEL
ALT: 19 U/L (ref 0–44)
AST: 25 U/L (ref 15–41)
Albumin: 3.3 g/dL — ABNORMAL LOW (ref 3.5–5.0)
Alkaline Phosphatase: 70 U/L (ref 38–126)
BUN: 90 mg/dL — ABNORMAL HIGH (ref 8–23)
CO2: 20 mmol/L — ABNORMAL LOW (ref 22–32)
Calcium: 8.3 mg/dL — ABNORMAL LOW (ref 8.9–10.3)
Chloride: 94 mmol/L — ABNORMAL LOW (ref 98–111)
Creatinine, Ser: 11.81 mg/dL — ABNORMAL HIGH (ref 0.44–1.00)
GFR calc Af Amer: 3 mL/min — ABNORMAL LOW (ref >60)
GFR calc non Af Amer: 3 mL/min — ABNORMAL LOW (ref >60)
Glucose, Bld: 153 mg/dL — ABNORMAL HIGH (ref 70–99)
Potassium: 6.3 mmol/L (ref 3.5–5.1)
Sodium: 135 mmol/L (ref 135–145)
Total Bilirubin: 0.8 mg/dL (ref 0.3–1.2)
Total Protein: 6.9 g/dL (ref 6.5–8.1)

## 2019-05-19 LAB — PROCALCITONIN: Procalcitonin: 1.08 ng/mL

## 2019-05-19 LAB — PROTIME-INR
INR: 1.4 — ABNORMAL HIGH (ref 0.8–1.2)
Prothrombin Time: 16.6 seconds — ABNORMAL HIGH (ref 11.4–15.2)

## 2019-05-19 LAB — FERRITIN: Ferritin: 563 ng/mL — ABNORMAL HIGH (ref 11–307)

## 2019-05-19 LAB — TSH: TSH: 0.496 u[IU]/mL (ref 0.350–4.500)

## 2019-05-19 LAB — LACTATE DEHYDROGENASE: LDH: 162 U/L (ref 98–192)

## 2019-05-19 LAB — TRIGLYCERIDES: Triglycerides: 271 mg/dL — ABNORMAL HIGH (ref <150)

## 2019-05-19 LAB — FIBRINOGEN: Fibrinogen: >800 mg/dL — ABNORMAL HIGH (ref 210–475)

## 2019-05-19 LAB — CBG MONITORING, ED
Glucose-Capillary: 275 mg/dL — ABNORMAL HIGH (ref 70–99)
Glucose-Capillary: 192 mg/dL — ABNORMAL HIGH (ref 70–99)

## 2019-05-19 LAB — AMMONIA: Ammonia: 14 umol/L (ref 9–35)

## 2019-05-19 LAB — LACTIC ACID, PLASMA
Lactic Acid, Venous: 2.4 mmol/L (ref 0.5–1.9)
Lactic Acid, Venous: 2.7 mmol/L (ref 0.5–1.9)

## 2019-05-19 LAB — POC SARS CORONAVIRUS 2 AG -  ED: SARS Coronavirus 2 Ag: POSITIVE — AB

## 2019-05-19 LAB — D-DIMER, QUANTITATIVE: D-Dimer, Quant: 0.92 ug/mL-FEU — ABNORMAL HIGH (ref 0.00–0.50)

## 2019-05-19 LAB — C-REACTIVE PROTEIN: CRP: 18.8 mg/dL — ABNORMAL HIGH (ref <1.0)

## 2019-05-19 MED ORDER — SODIUM CHLORIDE 0.9 % IV SOLN: 100.0000 mL | INTRAVENOUS | Status: DC | PRN

## 2019-05-19 MED ORDER — INSULIN ASPART 100 UNIT/ML ~~LOC~~ SOLN
0.0000 [IU] | Freq: Three times a day (TID) | SUBCUTANEOUS | Status: DC
  Administered 2019-05-20 (×2): 2 [IU] via SUBCUTANEOUS
  Administered 2019-05-20: 1 [IU] via SUBCUTANEOUS
  Administered 2019-05-21: 4 [IU] via SUBCUTANEOUS
  Administered 2019-05-21: 2 [IU] via SUBCUTANEOUS
  Administered 2019-05-21: 3 [IU] via SUBCUTANEOUS
  Administered 2019-05-22: 4 [IU] via SUBCUTANEOUS
  Administered 2019-05-22 (×2): 1 [IU] via SUBCUTANEOUS
  Administered 2019-05-23: 2 [IU] via SUBCUTANEOUS
  Administered 2019-05-23: 3 [IU] via SUBCUTANEOUS
  Filled 2019-05-19 (×2): qty 1

## 2019-05-19 MED ORDER — VITAMIN C 500 MG PO TABS
250.0000 mg | ORAL_TABLET | Freq: Every day | ORAL | Status: DC
  Administered 2019-05-20 – 2019-05-23 (×4): 250 mg via ORAL
  Filled 2019-05-19 (×2): qty 0.5
  Filled 2019-05-19: qty 1
  Filled 2019-05-19 (×2): qty 0.5
  Filled 2019-05-19: qty 1
  Filled 2019-05-19 (×2): qty 0.5
  Filled 2019-05-19 (×2): qty 1

## 2019-05-19 MED ORDER — SODIUM CHLORIDE 0.9 % IV SOLN
100.0000 mg | Freq: Every day | INTRAVENOUS | Status: AC
  Administered 2019-05-20 – 2019-05-23 (×4): 100 mg via INTRAVENOUS
  Filled 2019-05-19: qty 100
  Filled 2019-05-19 (×3): qty 20

## 2019-05-19 MED ORDER — DEXAMETHASONE SODIUM PHOSPHATE 10 MG/ML IJ SOLN
6.0000 mg | INTRAMUSCULAR | Status: DC
  Administered 2019-05-20 – 2019-05-23 (×4): 6 mg via INTRAVENOUS
  Filled 2019-05-19 (×4): qty 1

## 2019-05-19 MED ORDER — SODIUM CHLORIDE 0.9 % IV SOLN: 250.0000 mL | INTRAVENOUS | Status: DC | PRN

## 2019-05-19 MED ORDER — SODIUM BICARBONATE 650 MG PO TABS
650.0000 mg | ORAL_TABLET | Freq: Two times a day (BID) | ORAL | Status: DC
  Administered 2019-05-19 – 2019-05-23 (×7): 650 mg via ORAL
  Filled 2019-05-19 (×11): qty 1

## 2019-05-19 MED ORDER — AMIODARONE HCL 200 MG PO TABS
200.0000 mg | ORAL_TABLET | Freq: Every day | ORAL | Status: DC
  Administered 2019-05-21 – 2019-05-23 (×3): 200 mg via ORAL
  Filled 2019-05-19 (×5): qty 1

## 2019-05-19 MED ORDER — METOPROLOL SUCCINATE ER 25 MG PO TB24
12.5000 mg | ORAL_TABLET | Freq: Every day | ORAL | Status: DC
  Filled 2019-05-19: qty 1

## 2019-05-19 MED ORDER — WARFARIN SODIUM 7.5 MG PO TABS
7.5000 mg | ORAL_TABLET | Freq: Every day | ORAL | Status: DC
  Filled 2019-05-19: qty 1

## 2019-05-19 MED ORDER — LIDOCAINE-PRILOCAINE 2.5-2.5 % EX CREA: 1.0000 "application " | TOPICAL_CREAM | CUTANEOUS | Status: DC | PRN

## 2019-05-19 MED ORDER — DEXTROSE 50 % IV SOLN
1.0000 | Freq: Once | INTRAVENOUS | Status: AC
  Administered 2019-05-19: 50 mL via INTRAVENOUS
  Filled 2019-05-19: qty 50

## 2019-05-19 MED ORDER — ASPIRIN EC 81 MG PO TBEC
81.0000 mg | DELAYED_RELEASE_TABLET | Freq: Every day | ORAL | Status: DC
  Administered 2019-05-20 – 2019-05-23 (×4): 81 mg via ORAL
  Filled 2019-05-19 (×4): qty 1

## 2019-05-19 MED ORDER — THYROID 30 MG PO TABS
120.0000 mg | ORAL_TABLET | Freq: Every day | ORAL | Status: DC
  Administered 2019-05-21 – 2019-05-23 (×3): 120 mg via ORAL
  Filled 2019-05-19 (×7): qty 4

## 2019-05-19 MED ORDER — CHLORHEXIDINE GLUCONATE CLOTH 2 % EX PADS
6.0000 | MEDICATED_PAD | Freq: Every day | CUTANEOUS | Status: DC
  Administered 2019-05-21 – 2019-05-23 (×3): 6 via TOPICAL

## 2019-05-19 MED ORDER — DEXAMETHASONE SODIUM PHOSPHATE 10 MG/ML IJ SOLN
10.0000 mg | Freq: Once | INTRAMUSCULAR | Status: AC
  Administered 2019-05-19: 10 mg via INTRAVENOUS
  Filled 2019-05-19: qty 1

## 2019-05-19 MED ORDER — LIDOCAINE HCL (PF) 1 % IJ SOLN: 5.0000 mL | INTRAMUSCULAR | Status: DC | PRN

## 2019-05-19 MED ORDER — SODIUM ZIRCONIUM CYCLOSILICATE 5 G PO PACK
10.0000 g | PACK | Freq: Once | ORAL | Status: AC
  Administered 2019-05-19: 13:00:00 10 g via ORAL
  Filled 2019-05-19: qty 2

## 2019-05-19 MED ORDER — SODIUM CHLORIDE 0.9 % IV SOLN
200.0000 mg | Freq: Once | INTRAVENOUS | Status: AC
  Administered 2019-05-19: 200 mg via INTRAVENOUS
  Filled 2019-05-19: qty 40

## 2019-05-19 MED ORDER — FEBUXOSTAT 40 MG PO TABS
40.0000 mg | ORAL_TABLET | Freq: Every day | ORAL | Status: DC
  Administered 2019-05-21 – 2019-05-23 (×3): 40 mg via ORAL
  Filled 2019-05-19 (×6): qty 1

## 2019-05-19 MED ORDER — DEXTROSE 5 % IV SOLN
250.0000 mg | INTRAVENOUS | Status: DC
  Filled 2019-05-19 (×2): qty 250

## 2019-05-19 MED ORDER — VITAMIN D3 25 MCG (1000 UNIT) PO TABS
5000.0000 [IU] | ORAL_TABLET | ORAL | Status: DC
  Administered 2019-05-20 – 2019-05-22 (×2): 5000 [IU] via ORAL
  Filled 2019-05-19 (×3): qty 5

## 2019-05-19 MED ORDER — SODIUM CHLORIDE 0.9% FLUSH
3.0000 mL | Freq: Two times a day (BID) | INTRAVENOUS | Status: DC
  Administered 2019-05-20: 3 mL via INTRAVENOUS

## 2019-05-19 MED ORDER — ACETAMINOPHEN 650 MG RE SUPP: 650.0000 mg | Freq: Four times a day (QID) | RECTAL | Status: DC | PRN

## 2019-05-19 MED ORDER — ACETAMINOPHEN 325 MG PO TABS
650.0000 mg | ORAL_TABLET | Freq: Four times a day (QID) | ORAL | Status: DC | PRN
  Administered 2019-05-22: 650 mg via ORAL
  Filled 2019-05-19: qty 2

## 2019-05-19 MED ORDER — WARFARIN SODIUM 2.5 MG PO TABS: 2.5000 mg | ORAL_TABLET | Freq: Every day | ORAL | Status: DC

## 2019-05-19 MED ORDER — ADULT MULTIVITAMIN W/MINERALS CH
1.0000 | ORAL_TABLET | Freq: Every day | ORAL | Status: DC
  Administered 2019-05-20 – 2019-05-23 (×4): 1 via ORAL
  Filled 2019-05-19 (×4): qty 1

## 2019-05-19 MED ORDER — SODIUM CHLORIDE 0.9 % IV SOLN
1.0000 g | INTRAVENOUS | Status: DC
  Administered 2019-05-19 – 2019-05-22 (×4): 1 g via INTRAVENOUS
  Filled 2019-05-19 (×5): qty 10

## 2019-05-19 MED ORDER — ONDANSETRON HCL 4 MG PO TABS: 4.0000 mg | ORAL_TABLET | Freq: Four times a day (QID) | ORAL | Status: DC | PRN

## 2019-05-19 MED ORDER — PENTAFLUOROPROP-TETRAFLUOROETH EX AERO: 1.0000 "application " | INHALATION_SPRAY | CUTANEOUS | Status: DC | PRN

## 2019-05-19 MED ORDER — WARFARIN - PHARMACIST DOSING INPATIENT: Freq: Every day | Status: DC

## 2019-05-19 MED ORDER — INSULIN ASPART 100 UNIT/ML IV SOLN
10.0000 [IU] | Freq: Once | INTRAVENOUS | Status: AC
  Administered 2019-05-19: 10 [IU] via INTRAVENOUS

## 2019-05-19 MED ORDER — ONDANSETRON HCL 4 MG/2ML IJ SOLN: 4.0000 mg | Freq: Four times a day (QID) | INTRAMUSCULAR | Status: DC | PRN

## 2019-05-19 MED ORDER — WARFARIN SODIUM 5 MG PO TABS
7.5000 mg | ORAL_TABLET | Freq: Once | ORAL | Status: DC
  Filled 2019-05-19: qty 1

## 2019-05-19 MED ORDER — SODIUM CHLORIDE 0.9 % IV SOLN
500.0000 mg | INTRAVENOUS | Status: DC
  Administered 2019-05-19 – 2019-05-22 (×4): 500 mg via INTRAVENOUS
  Filled 2019-05-19 (×5): qty 500

## 2019-05-19 MED ORDER — CALCIUM ACETATE (PHOS BINDER) 667 MG PO CAPS
2001.0000 mg | ORAL_CAPSULE | Freq: Three times a day (TID) | ORAL | Status: DC
  Administered 2019-05-20 – 2019-05-23 (×10): 2001 mg via ORAL
  Filled 2019-05-19 (×16): qty 3

## 2019-05-19 MED ORDER — PANTOPRAZOLE SODIUM 40 MG PO TBEC
40.0000 mg | DELAYED_RELEASE_TABLET | Freq: Every day | ORAL | Status: DC
  Administered 2019-05-20 – 2019-05-23 (×4): 40 mg via ORAL
  Filled 2019-05-19 (×4): qty 1

## 2019-05-19 MED ORDER — WARFARIN SODIUM 5 MG PO TABS
5.0000 mg | ORAL_TABLET | Freq: Once | ORAL | Status: AC
  Administered 2019-05-19: 5 mg via ORAL
  Filled 2019-05-19: qty 1

## 2019-05-19 MED ORDER — INSULIN ASPART 100 UNIT/ML ~~LOC~~ SOLN
0.0000 [IU] | Freq: Every day | SUBCUTANEOUS | Status: DC
  Administered 2019-05-20 – 2019-05-21 (×2): 4 [IU] via SUBCUTANEOUS
  Administered 2019-05-22: 3 [IU] via SUBCUTANEOUS

## 2019-05-19 MED ORDER — SODIUM CHLORIDE 0.9% FLUSH: 3.0000 mL | INTRAVENOUS | Status: DC | PRN

## 2019-05-19 MED ORDER — AMLODIPINE BESYLATE 5 MG PO TABS
10.0000 mg | ORAL_TABLET | Freq: Every day | ORAL | Status: DC
  Administered 2019-05-21 – 2019-05-23 (×3): 10 mg via ORAL
  Filled 2019-05-19 (×4): qty 2

## 2019-05-19 NOTE — ED Notes (Signed)
Son Liliane Channel given update.

## 2019-05-19 NOTE — ED Triage Notes (Signed)
Pt has missed dialysis for the last 4 days due to weakness. Altered for the last 2 days. Given 500 mL NS by ems. Normally pt not on oxygen. Pt smells of a UTI. CBG 188. Pt still makes small amount of urine. Temp of 99.3.

## 2019-05-19 NOTE — Progress Notes (Addendum)
ANTICOAGULATION CONSULT NOTE - Initial Consult  Pharmacy Consult for warfarin Indication: atrial fibrillation  Allergies  Allergen Reactions  . Insulin Glargine Swelling    "Makes me swell like a balloon all over", including face, but without any respiratory distress or rashes. Associated with weight gain.  . Statins Other (See Comments)    "I've tried them all; my muscle aches were so bad I couldn't walk".    Patient Measurements: Weight: 171 lb 8.3 oz (77.8 kg)  Vital Signs: BP: 125/51 (12/22 1530) Pulse Rate: 63 (12/22 1530)  Labs: Recent Labs    05/19/19 0930  HGB 11.4*  HCT 36.7  PLT 324  LABPROT 16.6*  INR 1.4*  CREATININE 11.81*    Estimated Creatinine Clearance: 3.9 mL/min (A) (by C-G formula based on SCr of 11.81 mg/dL (H)).   Medical History: Past Medical History:  Diagnosis Date  . Anemia   . Arthritis   . Blood transfusion without reported diagnosis   . CAD (coronary artery disease)    a. 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  . Cataract   . Chronic anemia   . Chronic combined systolic and diastolic CHF (congestive heart failure) ([place])    a. 2D echo 08/2016 at [facility]: EF 50-55% with inferior wall HK, impaired LV filling, fair study.  . CKD (chronic kidney disease), stage III   . Diabetes ([place])   . Gastritis   . GERD (gastroesophageal reflux disease)   . Gout   . Headache   . HTN (hypertension)   . Hyperlipidemia   . Hypothyroidism   . Iron deficiency anemia 11/08/2016  . Normocytic anemia 10/26/2016  . NSTEMI (non-ST elevated myocardial infarction) ([place])    a. Complex admission 08/2016 - with severe hyperglycemia, AKI on CKD, severe anemia down to Hgb 6.8, acute combined CHF, troponin of 8.5, cath deferred due to renal dysfunction.  . Thyroid disease   . Wears dentures     Medications:  (Not in a hospital admission)   Assessment: Pharmacy consulted to dose warfarin in patient with atrial fibrillation. Patient's INR  on admission is 1.4. Home dose listed as 5 mg Tue-Thu-Sat and 7.5 mg ROW per anticoag clinic.  Per RN/patient, unsure of last dose but has not taken today 12/22.  Goal of Therapy:  INR 2-3 Monitor platelets by anticoagulation protocol: Yes   Plan:  Warfarin 5 mg x 1 dose for subtherapeutic INR Monitor labs and s/s of bleeding.  Ramond Craver 05/19/2019,4:03 PM

## 2019-05-19 NOTE — ED Notes (Signed)
Have started pt on 3L [place]. O2 sats 90-93%

## 2019-05-19 NOTE — ED Notes (Signed)
Date and time results received: 05/19/19  1215 (use smartphrase ".now" to insert current time)  Test: Lactic Critical Value: 2.4  Name of Provider Notified: Dr Laverta [place]  Orders Received? Or Actions Taken?: NA

## 2019-05-19 NOTE — ED Provider Notes (Signed)
Emergency Department Provider Note   I have reviewed the triage vital signs and the nursing notes.   HISTORY  Chief Complaint Altered Mental Status   HPI [name] is a 78 y.o. female with PMH of ESRD (MWF HD), CAD, HTN presents to the ED by EMS from home. Family report that the patient has become increasingly confused and has missed her last 3-4 HD sessions. They deny any fever or cough. No sick contacts. Patient tells me that "I feel fine. Maybe a little weak." She denies any pain, SOB, vomiting, or diarrhea.   Level 5 caveat: Confusion.   Spoke with her friend Shirlean Schlein listed as her emergency contact.  He tells me that she lives with him and that her son lives in [place].  Her last dialysis was actually Friday and she missed yesterday due to a feeling badly. No known COVID exposure. No other symptoms or history provided.   Past Medical History:  Diagnosis Date  . Anemia   . Arthritis   . Blood transfusion without reported diagnosis   . CAD (coronary artery disease)    a. 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  . Cataract   . Chronic anemia   . Chronic combined systolic and diastolic CHF (congestive heart failure) ([place])    a. 2D echo 08/2016 at [facility]: EF 50-55% with inferior wall HK, impaired LV filling, fair study.  . CKD (chronic kidney disease), stage III   . Diabetes ([place])   . Gastritis   . GERD (gastroesophageal reflux disease)   . Gout   . Headache   . HTN (hypertension)   . Hyperlipidemia   . Hypothyroidism   . Iron deficiency anemia 11/08/2016  . Normocytic anemia 10/26/2016  . NSTEMI (non-ST elevated myocardial infarction) ([place])    a. Complex admission 08/2016 - with severe hyperglycemia, AKI on CKD, severe anemia down to Hgb 6.8, acute combined CHF, troponin of 8.5, cath deferred due to renal dysfunction.  . Thyroid disease   . Wears dentures     Patient Active Problem List   Diagnosis Date Noted  . Acute hypoxemic  respiratory failure due to COVID-19 ([place]) 05/19/2019  . Atrial fibrillation ([place]) 12/03/2018  . Encounter for therapeutic drug monitoring 12/03/2018  . Pressure injury of skin 11/24/2018  . CAD (coronary artery disease) 11/21/2018  . Acute on chronic diastolic CHF (congestive heart failure) ([place]) 11/21/2018  . Respiratory failure with hypoxia ([place]) 11/21/2018  . End-stage renal disease on hemodialysis ([place]) 11/21/2018  . Diabetes mellitus type 2, controlled, with complications ([place]) 22/29/7989  . 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 ESRD (end stage renal disease) on dialysis ([place]) 11/12/2018  . Non-ST elevation (NSTEMI) myocardial infarction ([place]) 11/06/2018  . Polyp of transverse colon 10/08/2017  . Anemia 08/07/2017  . GI bleed 07/30/2017  . Hyperkalemia 07/30/2017  . Essential hypertension, benign 12/13/2016  . Hypothyroidism 12/13/2016  . Iron deficiency anemia 11/08/2016  . Normocytic anemia 10/26/2016  . Elevated troponin   . Aortic atherosclerosis ([place]) 10/25/2016  . Symptomatic anemia 10/04/2016  . History of non-ST elevation myocardial infarction (NSTEMI) 10/04/2016  . Mixed hyperlipidemia 10/04/2016  . Neuropathy 09/20/2016  . Chronic kidney disease, stage IV (severe) ([place]) 08/03/2015  . Hip fracture ([place]) 02/05/2015  . HTN (hypertension) 02/05/2015  . Gout 02/05/2015  . Left humeral fracture 02/05/2015  . Hyperlipidemia associated with type 2 diabetes mellitus ([place]) 05/01/2013    Past Surgical History:  Procedure Laterality Date  .  BASCILIC VEIN TRANSPOSITION Right 06/12/2018   Procedure: FIRST STAGE BASCILIC VEIN TRANSPOSITION RIGHT ARM;  Surgeon: Rosetta Posner, MD;  Location: [place];  Service: Vascular;  Laterality: Right;  . BASCILIC VEIN TRANSPOSITION Right 08/07/2018   Procedure: BASCILIC VEIN TRANSPOSITION SECOND STAGE RIGHT ARM;  Surgeon: Rosetta Posner, MD;  Location: [place];  Service: Vascular;  Laterality: Right;  . CATARACT EXTRACTION     left  . COLONOSCOPY WITH  PROPOFOL N/A 10/08/2016   5 mm transverse colon polyp note resected due to plavix. hemorrhoids  . CORONARY ARTERY BYPASS GRAFT N/A 11/20/2018   Procedure: CORONARY ARTERY BYPASS GRAFTING (CABG), ON PUMP, TIMES four, USING LEFT INTERNAL MAMMARY ARTERY AND ENDOSCOPICALLY HARVESTED RIGHT GREATER SAPHENOUS VEIN;  Surgeon: Melrose Nakayama, MD;  Location: [place];  Service: Open Heart Surgery;  Laterality: N/A;  . ENTEROSCOPY N/A 08/08/2017   Procedure: ENTEROSCOPY;  Surgeon: Daneil Dolin, MD;  [name];  Service: Endoscopy;  Laterality: N/A;  . ESOPHAGOGASTRODUODENOSCOPY N/A 10/06/2016   mild chroni gastritis, negative H.pylori  . ESOPHAGOGASTRODUODENOSCOPY (EGD) WITH PROPOFOL N/A 03/12/2017   mild edema/erythema of stomach, small bowel biopsy with focal villous tip lymphocytosis, ?partially developed celiac  . 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 GIVENS CAPSULE STUDY  10/08/2016   normal  . GIVENS CAPSULE STUDY N/A 10/29/2016   occasional gastric erosion, unremarkable small bowel  . LEFT HEART CATH AND CORONARY ANGIOGRAPHY N/A 11/12/2018   Procedure: LEFT HEART CATH AND CORONARY ANGIOGRAPHY;  Surgeon: Burnell Blanks, MD;  Location: [place] [facility];  Service: Cardiovascular;  Laterality: N/A;  . ORIF HUMERUS FRACTURE Left 02/07/2015   Procedure: OPEN REDUCTION INTERNAL FIXATION (ORIF) PROXIMAL HUMERUS FRACTURE;  Surgeon: Marybelle Killings, MD;  Location: [place];  Service: Orthopedics;  Laterality: Left;  . TEE WITHOUT CARDIOVERSION N/A 11/20/2018   Procedure: TRANSESOPHAGEAL ECHOCARDIOGRAM (TEE);  Surgeon: Melrose Nakayama, MD;  Location: [place];  Service: Open Heart Surgery;  Laterality: N/A;  . teeth extractions    . THYROID SURGERY    . TOTAL  HIP ARTHROPLASTY Left 02/07/2015   Procedure: TOTAL HIP ARTHROPLASTY ANTERIOR APPROACH ;  Surgeon: Marybelle Killings, MD;  Location: [place];  Service: Orthopedics;  Laterality: Left;  . WRIST SURGERY Left     Allergies Insulin glargine and Statins  Family History  Problem Relation Age of Onset  . Diabetes Mother   . Asthma Mother   . Early death Mother 78       Pneumonia  . Early death Father        Killed at rodeo  . CAD Neg Hx   . GI Bleed Neg Hx     Social History Social History   Tobacco Use  . Smoking status: Never Smoker  . Smokeless tobacco: Never Used  Substance Use Topics  . Alcohol use: No    Alcohol/week: 0.0 standard drinks  . Drug use: No    Review of Systems  Constitutional: No fever/chills. Generalized weakness.  Eyes: No visual changes. ENT: No sore throat. Cardiovascular: Denies chest pain. Respiratory: Denies shortness of breath. Gastrointestinal: No abdominal pain.  No nausea, no vomiting.  No diarrhea.  No constipation. Genitourinary: Negative for dysuria. Musculoskeletal: Negative for back pain. Skin: Negative for rash. Neurological: Negative for headaches, focal weakness or  numbness.  10-point ROS otherwise negative.  ____________________________________________   PHYSICAL EXAM:  VITAL SIGNS: ED Triage Vitals  Enc Vitals Group     BP 05/19/19 0917 (!) 153/50     Pulse Rate 05/19/19 0917 (!) 58     Resp 05/19/19 0917 12     Temp --      Temp src --      SpO2 05/19/19 0917 (!) 80 %     Weight 05/19/19 0915 171 lb 8.3 oz (77.8 kg)   Constitutional: Alert and conversational but confused. Oriented to person and generally to place "hospital." Well appearing and in no acute distress. Eyes: Conjunctivae are normal.  Head: Atraumatic. Nose: No congestion/rhinnorhea. Mouth/Throat: Mucous membranes are moist.  Neck: No stridor.   Cardiovascular: Normal rate, regular rhythm. Good peripheral circulation. Grossly normal heart sounds.     Respiratory: Normal respiratory effort.  No retractions. Lungs CTAB. Gastrointestinal: Soft and nontender. No distention.  Musculoskeletal: No lower extremity tenderness nor edema. No gross deformities of extremities. Neurologic:  Normal speech and language. No gross focal neurologic deficits are appreciated. No gross weakness/numbness but asterixis in the upper extremities.  Skin:  Skin is warm, dry and intact. No rash noted.   ____________________________________________   LABS (all labs ordered are listed, but only abnormal results are displayed)  Labs Reviewed  URINALYSIS, ROUTINE W REFLEX MICROSCOPIC - Abnormal; Notable for the following components:      Result Value   Color, Urine AMBER (*)    APPearance HAZY (*)    Protein, ur 100 (*)    Bacteria, UA RARE (*)    All other components within normal limits  LACTIC ACID, PLASMA - Abnormal; Notable for the following components:   Lactic Acid, Venous 2.4 (*)    All other components within normal limits  LACTIC ACID, PLASMA - Abnormal; Notable for the following components:   Lactic Acid, Venous 2.7 (*)    All other components within normal limits  CBC WITH DIFFERENTIAL/PLATELET - Abnormal; Notable for the following components:   WBC 14.9 (*)    RBC 3.78 (*)    Hemoglobin 11.4 (*)    RDW 16.1 (*)    Neutro Abs 13.2 (*)    Abs Immature Granulocytes 0.10 (*)    All other components within normal limits  COMPREHENSIVE METABOLIC PANEL - Abnormal; Notable for the following components:   Potassium 6.3 (*)    Chloride 94 (*)    CO2 20 (*)    Glucose, Bld 153 (*)    BUN 90 (*)    Creatinine, Ser 11.81 (*)    Calcium 8.3 (*)    Albumin 3.3 (*)    GFR calc non Af Amer 3 (*)    GFR calc Af Amer 3 (*)    All other components within normal limits  D-DIMER, QUANTITATIVE (NOT AT [facility]) - Abnormal; Notable for the following components:   D-Dimer, Quant 0.92 (*)    All other components within normal limits  FERRITIN - Abnormal;  Notable for the following components:   Ferritin 563 (*)    All other components within normal limits  TRIGLYCERIDES - Abnormal; Notable for the following components:   Triglycerides 271 (*)    All other components within normal limits  FIBRINOGEN - Abnormal; Notable for the following components:   Fibrinogen >800 (*)    All other components within normal limits  C-REACTIVE PROTEIN - Abnormal; Notable for the following components:   CRP 18.8 (*)  All other components within normal limits  HEMOGLOBIN A1C - Abnormal; Notable for the following components:   Hgb A1c MFr Bld 6.1 (*)    All other components within normal limits  PROTIME-INR - Abnormal; Notable for the following components:   Prothrombin Time 16.6 (*)    INR 1.4 (*)    All other components within normal limits  CBC - Abnormal; Notable for the following components:   RBC 3.83 (*)    Hemoglobin 11.3 (*)    RDW 15.6 (*)    All other components within normal limits  FERRITIN - Abnormal; Notable for the following components:   Ferritin 847 (*)    All other components within normal limits  C-REACTIVE PROTEIN - Abnormal; Notable for the following components:   CRP 22.0 (*)    All other components within normal limits  PROTIME-INR - Abnormal; Notable for the following components:   Prothrombin Time 16.3 (*)    INR 1.3 (*)    All other components within normal limits  COMPREHENSIVE METABOLIC PANEL - Abnormal; Notable for the following components:   Sodium 134 (*)    Chloride 90 (*)    Glucose, Bld 206 (*)    BUN 59 (*)    Creatinine, Ser 7.06 (*)    Calcium 8.5 (*)    Albumin 3.1 (*)    GFR calc non Af Amer 5 (*)    GFR calc Af Amer 6 (*)    All other components within normal limits  GLUCOSE, CAPILLARY - Abnormal; Notable for the following components:   Glucose-Capillary 239 (*)    All other components within normal limits  COMPREHENSIVE METABOLIC PANEL - Abnormal; Notable for the following components:   Sodium 133  (*)    Chloride 91 (*)    CO2 21 (*)    Glucose, Bld 221 (*)    BUN 103 (*)    Creatinine, Ser 8.46 (*)    Calcium 7.6 (*)    Total Protein 6.2 (*)    Albumin 2.8 (*)    GFR calc non Af Amer 4 (*)    GFR calc Af Amer 5 (*)    All other components within normal limits  FERRITIN - Abnormal; Notable for the following components:   Ferritin 1,151 (*)    All other components within normal limits  C-REACTIVE PROTEIN - Abnormal; Notable for the following components:   CRP 16.8 (*)    All other components within normal limits  PROTIME-INR - Abnormal; Notable for the following components:   Prothrombin Time 20.3 (*)    INR 1.8 (*)    All other components within normal limits  CBC - Abnormal; Notable for the following components:   WBC 11.4 (*)    RBC 3.76 (*)    Hemoglobin 11.0 (*)    HCT 35.2 (*)    All other components within normal limits  RENAL FUNCTION PANEL - Abnormal; Notable for the following components:   Sodium 133 (*)    Chloride 92 (*)    CO2 19 (*)    Glucose, Bld 291 (*)    BUN 83 (*)    Creatinine, Ser 7.88 (*)    Calcium 7.8 (*)    Phosphorus 6.0 (*)    Albumin 2.8 (*)    GFR calc non Af Amer 4 (*)    GFR calc Af Amer 5 (*)    Anion gap >20 (*)    All other components within normal limits  CBC - Abnormal;  Notable for the following components:   RBC 3.82 (*)    Hemoglobin 11.1 (*)    MCHC 29.6 (*)    All other components within normal limits  GLUCOSE, CAPILLARY - Abnormal; Notable for the following components:   Glucose-Capillary 317 (*)    All other components within normal limits  GLUCOSE, CAPILLARY - Abnormal; Notable for the following components:   Glucose-Capillary 254 (*)    All other components within normal limits  GLUCOSE, CAPILLARY - Abnormal; Notable for the following components:   Glucose-Capillary 236 (*)    All other components within normal limits  POC SARS CORONAVIRUS 2 AG -  ED - Abnormal; Notable for the following components:   SARS  Coronavirus 2 Ag POSITIVE (*)    All other components within normal limits  CBG MONITORING, ED - Abnormal; Notable for the following components:   Glucose-Capillary 275 (*)    All other components within normal limits  CBG MONITORING, ED - Abnormal; Notable for the following components:   Glucose-Capillary 192 (*)    All other components within normal limits  CBG MONITORING, ED - Abnormal; Notable for the following components:   Glucose-Capillary 171 (*)    All other components within normal limits  CBG MONITORING, ED - Abnormal; Notable for the following components:   Glucose-Capillary 198 (*)    All other components within normal limits  CBG MONITORING, ED - Abnormal; Notable for the following components:   Glucose-Capillary 250 (*)    All other components within normal limits  URINE CULTURE  CULTURE, BLOOD (ROUTINE X 2)  CULTURE, BLOOD (ROUTINE X 2)  AMMONIA  PROCALCITONIN  LACTATE DEHYDROGENASE  TSH  PROCALCITONIN  LACTIC ACID, PLASMA  MAGNESIUM  PROCALCITONIN   ____________________________________________  EKG   EKG Interpretation  Date/Time:  Tuesday May 19 2019 09:17:50 EST Ventricular Rate:  58 PR Interval:    QRS Duration: 135 QT Interval:  436 QTC Calculation: 429 R Axis:   128 Text Interpretation: Sinus rhythm RBBB and LPFB No STEMI Confirmed by Nanda [name] [id number]) on 05/19/2019 9:35:35 AM      ____________________________________________   PROCEDURES  Procedure(s) performed:   Procedures  CRITICAL CARE Performed by: Margette Fast Total critical care time: 35 minutes Critical care time was exclusive of separately billable procedures and treating other patients. Critical care was necessary to treat or prevent imminent or life-threatening deterioration. Critical care was time spent personally by me on the following activities: development of treatment plan with patient and/or surrogate as well as nursing, discussions with consultants,  evaluation of patient's response to treatment, examination of patient, obtaining history from patient or surrogate, ordering and performing treatments and interventions, ordering and review of laboratory studies, ordering and review of radiographic studies, pulse oximetry and re-evaluation of patient's condition.  Nanda [name], MD Emergency Medicine  ____________________________________________   INITIAL IMPRESSION / ASSESSMENT AND PLAN / ED COURSE  Pertinent labs & imaging results that were available during my care of the patient were reviewed by me and considered in my medical decision making (see chart for details).   Patient presents to the emergency department with altered mental status in the setting of missing dialysis over the past 3-4 sessions.  She is hypoxemic here with no increased work of breathing.  EKG shows intraventricular conduction delay which seems progressed from her recent EKG.  She is not experiencing pain.  COVID-19 is a consideration.  Patient may also be hypoxemic from fluid but is not clinically volume overloaded on  my exam.  Patient also noticed to have some upper extremity asterixis but patient tells me this is typical for her and she takes gabapentin for this.  We will send off ammonia level in addition to labs.   12:38 PM  Spoke with Nephrology regarding the patient's K. No HD now but can stay at [facility] and will coordinate.   Discussed patient's case with [facility] to request admission. Patient and family (if present) updated with plan. Care transferred to [facility] service.  I reviewed all nursing notes, vitals, pertinent old records, EKGs, labs, imaging (as available).  ____________________________________________  FINAL CLINICAL IMPRESSION(S) / ED DIAGNOSES  Final diagnoses:  Acute respiratory failure with hypoxia ([place])  COVID-19  Hyperkalemia     MEDICATIONS GIVEN DURING THIS VISIT:  Medications  Chlorhexidine Gluconate Cloth 2 % PADS 6 each (6 each Topical Given  05/21/19 0643)  pentafluoroprop-tetrafluoroeth (GEBAUERS) aerosol 1 application (has no administration in time range)  lidocaine (PF) (XYLOCAINE) 1 % injection 5 mL (has no administration in time range)  lidocaine-prilocaine (EMLA) cream 1 application (has no administration in time range)  0.9 %  sodium chloride infusion (has no administration in time range)  0.9 %  sodium chloride infusion (has no administration in time range)  dexamethasone (DECADRON) injection 6 mg (6 mg Intravenous Given 05/21/19 0832)  remdesivir 200 mg in sodium chloride 0.9% 250 mL IVPB (0 mg Intravenous Stopped 05/19/19 1549)    Followed by  remdesivir 100 mg in sodium chloride 0.9 % 100 mL IVPB (100 mg Intravenous New Bag/Given 05/21/19 0956)  aspirin EC tablet 81 mg (81 mg Oral Given 05/21/19 0832)  febuxostat (ULORIC) tablet 40 mg (40 mg Oral Given 05/21/19 0831)  amiodarone (PACERONE) tablet 200 mg (200 mg Oral Given 05/21/19 0833)  amLODipine (NORVASC) tablet 10 mg (10 mg Oral Given 05/21/19 0831)  thyroid (ARMOUR) tablet 120 mg (120 mg Oral Given 05/21/19 0959)  calcium acetate (PHOSLO) capsule 2,001 mg (2,001 mg Oral Given 05/21/19 1205)  pantoprazole (PROTONIX) EC tablet 40 mg (40 mg Oral Given 05/21/19 0831)  sodium bicarbonate tablet 650 mg (650 mg Oral Given 05/21/19 0831)  vitamin C (ASCORBIC ACID) tablet 250 mg (250 mg Oral Given 05/21/19 8099)  cholecalciferol (VITAMIN D) tablet 5,000 Units (5,000 Units Oral Given 05/20/19 1218)  multivitamin with minerals tablet 1 tablet (1 tablet Oral Given 05/21/19 0832)  acetaminophen (TYLENOL) tablet 650 mg (has no administration in time range)    Or  acetaminophen (TYLENOL) suppository 650 mg (has no administration in time range)  ondansetron (ZOFRAN) tablet 4 mg (has no administration in time range)    Or  ondansetron (ZOFRAN) injection 4 mg (has no administration in time range)  insulin aspart (novoLOG) injection 0-6 Units (2 Units Subcutaneous Given 05/21/19  1206)  insulin aspart (novoLOG) injection 0-5 Units (4 Units Subcutaneous Given 05/20/19 2201)  cefTRIAXone (ROCEPHIN) 1 g in sodium chloride 0.9 % 100 mL IVPB ( Intravenous Stopped 05/20/19 1638)  azithromycin (ZITHROMAX) 500 mg in sodium chloride 0.9 % 250 mL IVPB (500 mg Intravenous New Bag/Given 05/20/19 1759)  Warfarin - Pharmacist Dosing Inpatient ( Does not apply Given 05/20/19 1607)  warfarin (COUMADIN) tablet 5 mg (has no administration in time range)  sodium chloride (OCEAN) 0.65 % nasal spray 1 spray (has no administration in time range)  dexamethasone (DECADRON) injection 10 mg (10 mg Intravenous Given 05/19/19 1048)  sodium zirconium cyclosilicate (LOKELMA) packet 10 g (10 g Oral Given 05/19/19 1251)  insulin aspart (novoLOG) injection 10 Units (  10 Units Intravenous Given 05/19/19 1254)  dextrose 50 % solution 50 mL (50 mLs Intravenous Given 05/19/19 1253)  warfarin (COUMADIN) tablet 5 mg (5 mg Oral Given 05/19/19 1746)  warfarin (COUMADIN) tablet 10 mg (10 mg Oral Given 05/20/19 1553)    Note:  This document was prepared using Dragon voice recognition software and may include unintentional dictation errors.  Nanda [name], MD, [facility] Emergency Medicine    Jordyan Hardiman, [facility], MD 05/21/19 [phone]

## 2019-05-19 NOTE — ED Notes (Signed)
Pt ate 100% of dinner tray. Attempted to call and update pt son, unsuccessful.

## 2019-05-19 NOTE — ED Notes (Signed)
[facility] in [place]

## 2019-05-19 NOTE — H&P (Addendum)
History and Physical    [name] VVO:[id number] DOB: 06-Nov-1940 DOA: 05/19/2019  PCP: Doree Albee, MD   Patient coming from: Home  Chief Complaint: Confusion with missed hemodialysis sessions  HPI: [name] is a 78 y.o. female with medical history significant for CAD status post NSTEMI and CABG x4 (11/20/2018) with postop atrial fibrillation on Coumadin, diabetes, hypothyroidism, hypertension, gout, ESRD on HD MWF, and diastolic CHF with LVEF 26-94% along with pulmonary hypertension, who presented to the ED with 2-day history of altered mentation.  She missed her last 2 hemodialysis sessions due to some persistent weakness and confusion.  She has not been noted to be short of breath or have any chest pain, nausea, vomiting, or diarrhea.  History is difficult to obtain on account of her confusion.   ED Course: Stable vital signs are noted and she is noted to be hypoxemic and started on 3 L nasal cannula.  She is positive for Covid and was given some dexamethasone.  She has mild leukocytosis of 14,900 with hemoglobin 11.4.  BUN is 19 creatinine is 11.8 with a lactic acid of 2.4 and procalcitonin 1.08.  Potassium is currently 6.3 and she has been given some D50 and insulin along with Lokelma.  She has been seen by nephrology with plans to hemodialyzed while here.  Review of Systems: Cannot be obtained given patient condition.  Past Medical History:  Diagnosis Date  . Anemia   . Arthritis   . Blood transfusion without reported diagnosis   . CAD (coronary artery disease)    a. 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  . Cataract   . Chronic anemia   . Chronic combined systolic and diastolic CHF (congestive heart failure) ([place])    a. 2D echo 08/2016 at [facility]: EF 50-55% with inferior wall HK, impaired LV filling, fair study.  . CKD (chronic kidney disease), stage III   . Diabetes ([place])   . Gastritis   . GERD (gastroesophageal reflux disease)   .  Gout   . Headache   . HTN (hypertension)   . Hyperlipidemia   . Hypothyroidism   . Iron deficiency anemia 11/08/2016  . Normocytic anemia 10/26/2016  . NSTEMI (non-ST elevated myocardial infarction) ([place])    a. Complex admission 08/2016 - with severe hyperglycemia, AKI on CKD, severe anemia down to Hgb 6.8, acute combined CHF, troponin of 8.5, cath deferred due to renal dysfunction.  . Thyroid disease   . Wears dentures     Past Surgical History:  Procedure Laterality Date  . BASCILIC VEIN TRANSPOSITION Right 06/12/2018   Procedure: FIRST STAGE BASCILIC VEIN TRANSPOSITION RIGHT ARM;  Surgeon: Rosetta Posner, MD;  Location: [place];  Service: Vascular;  Laterality: Right;  . BASCILIC VEIN TRANSPOSITION Right 08/07/2018   Procedure: BASCILIC VEIN TRANSPOSITION SECOND STAGE RIGHT ARM;  Surgeon: Rosetta Posner, MD;  Location: [place];  Service: Vascular;  Laterality: Right;  . CATARACT EXTRACTION     left  . COLONOSCOPY WITH PROPOFOL N/A 10/08/2016   5 mm transverse colon polyp note resected due to plavix. hemorrhoids  . CORONARY ARTERY BYPASS GRAFT N/A 11/20/2018   Procedure: CORONARY ARTERY BYPASS GRAFTING (CABG), ON PUMP, TIMES four, USING LEFT INTERNAL MAMMARY ARTERY AND ENDOSCOPICALLY HARVESTED RIGHT GREATER SAPHENOUS VEIN;  Surgeon: Melrose Nakayama, MD;  Location: [place];  Service: Open Heart Surgery;  Laterality: N/A;  . ENTEROSCOPY N/A 08/08/2017   Procedure: ENTEROSCOPY;  Surgeon: Daneil Dolin, MD;  [name];  Service: Endoscopy;  Laterality: N/A;  . ESOPHAGOGASTRODUODENOSCOPY N/A 10/06/2016   mild chroni gastritis, negative H.pylori  . ESOPHAGOGASTRODUODENOSCOPY (EGD) WITH PROPOFOL N/A 03/12/2017   mild edema/erythema of stomach, small bowel biopsy with focal villous tip lymphocytosis, ?partially developed celiac  . 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 GIVENS CAPSULE STUDY  10/08/2016   normal  . GIVENS CAPSULE STUDY N/A 10/29/2016   occasional gastric erosion, unremarkable small bowel  . LEFT HEART CATH AND CORONARY ANGIOGRAPHY N/A 11/12/2018   Procedure: LEFT HEART CATH AND CORONARY ANGIOGRAPHY;  Surgeon: Burnell Blanks, MD;  Location: [place] [facility];  Service: Cardiovascular;  Laterality: N/A;  . ORIF HUMERUS FRACTURE Left 02/07/2015   Procedure: OPEN REDUCTION INTERNAL FIXATION (ORIF) PROXIMAL HUMERUS FRACTURE;  Surgeon: Marybelle Killings, MD;  Location: [place];  Service: Orthopedics;  Laterality: Left;  . TEE WITHOUT CARDIOVERSION N/A 11/20/2018   Procedure: TRANSESOPHAGEAL ECHOCARDIOGRAM (TEE);  Surgeon: Melrose Nakayama, MD;  Location: [place];  Service: Open Heart Surgery;  Laterality: N/A;  . teeth extractions    . THYROID SURGERY    . TOTAL HIP ARTHROPLASTY Left 02/07/2015   Procedure: TOTAL HIP ARTHROPLASTY ANTERIOR APPROACH ;  Surgeon: Marybelle Killings, MD;  Location: [place];  Service: Orthopedics;  Laterality: Left;  . WRIST SURGERY Left      reports that she has never smoked. She has never used smokeless tobacco. She reports that she does not drink alcohol or use drugs.  Allergies  Allergen Reactions  . Insulin Glargine Swelling    "Makes me swell like a balloon all over", including face, but without any respiratory distress or rashes. Associated with weight gain.  . Statins Other (See Comments)    "I've tried them all; my muscle aches were so bad I couldn't walk".    Family History  Problem Relation Age of Onset  . Diabetes Mother   . Asthma Mother   . Early death Mother 59       Pneumonia  . Early death Father        Killed at rodeo  . CAD Neg Hx   . GI Bleed Neg Hx     Prior to Admission medications   Medication Sig Start Date End Date Taking? Authorizing Provider  amiodarone (PACERONE) 200 MG  tablet Take 1 tablet (200 mg total) by mouth daily. 01/28/19  Yes Arnoldo Lenis, MD  amLODipine (NORVASC) 10 MG tablet Take 1 tablet by mouth once daily 05/12/19  Yes Hilty, Nadean Corwin, MD  calcium acetate (PHOSLO) 667 MG capsule Take 667 mg by mouth 3 (three) times daily with meals. 3 capsules with each meal and 1 with snacks daily   Yes [provider]  Evolocumab (REPATHA SURECLICK) 710 MG/ML SOAJ Inject 140 mg into the skin every 14 (fourteen) days. 01/06/19  Yes Branch, Alphonse Guild, MD  febuxostat (ULORIC) 40 MG tablet Take 1 tablet by mouth once daily 02/17/19  Yes Gosrani, Nimish C, MD  gabapentin (NEURONTIN) 300 MG capsule TAKE 2 CAPSULES BY MOUTH ONCE DAILY AT NIGHT 03/17/19 05/19/19 Yes Gosrani, Nimish C, MD  metoprolol succinate (TOPROL XL) 25 MG 24 hr tablet Take 0.5 tablets (12.5 mg total) by mouth daily. 11/28/18 11/28/19 Yes Nani Skillern, PA-C  NP THYROID 120 MG  tablet Take 1 tablet by mouth once daily 02/01/19 05/19/19 Yes Gosrani, Nimish C, MD  sodium bicarbonate 650 MG tablet Take 650 mg by mouth 2 (two) times daily.   Yes [provider]  warfarin (COUMADIN) 2.5 MG tablet Take 1 1/2 tablets daily or as directed 01/05/19  Yes Branch, Alphonse Guild, MD  acetaminophen (TYLENOL) 325 MG tablet Take 2 tablets (650 mg total) by mouth every 6 (six) hours as needed for mild pain or headache. 11/28/18   Nani Skillern, PA-C  Alcohol Swabs (B-D SINGLE USE SWABS REGULAR) PADS  02/26/18   [provider]  Ascorbic Acid (VITAMIN C) 100 MG tablet Take 1 tablet (100 mg total) by mouth daily. 11/28/18   Nani Skillern, PA-C  aspirin EC 81 MG EC tablet Take 1 tablet (81 mg total) by mouth daily. 11/28/18   Nani Skillern, PA-C  BD PEN NEEDLE MICRO U/F 32G X 6 MM MISC  08/04/18   [provider]  Cholecalciferol (VITAMIN D-3) 125 MCG (5000 UT) TABS Take 5,000 mg by mouth 3 (three) times a week.     [provider]  Multiple Vitamin (MULTIVITAMIN  WITH MINERALS) TABS tablet Take 1 tablet by mouth daily.    [provider]  pantoprazole (PROTONIX) 40 MG tablet Take 1 tablet (40 mg total) by mouth 2 (two) times daily. 30 minutes before breakfast Patient taking differently: Take 40 mg by mouth daily before breakfast.  11/27/17   Annitta Needs, NP  gabapentin (NEURONTIN) 300 MG capsule Take 2 capsules (600 mg total) by mouth at bedtime. 11/18/17   Mahala Menghini, PA-C  gabapentin (NEURONTIN) 300 MG capsule TAKE 2 CAPSULES BY MOUTH ONCE DAILY AT NIGHT 01/30/19   Doree Albee, MD  NP THYROID 120 MG tablet Take 120 mg by mouth daily. 08/28/18   [provider]    Physical Exam: Vitals:   05/19/19 1100 05/19/19 1230 05/19/19 1300 05/19/19 1330  BP: (!) 162/64 (!) 145/43 (!) 152/43 (!) 140/48  Pulse:  (!) 104 (!) 56   Resp: (!) 22 20 20 18   SpO2:  96% 94%   Weight:        Constitutional: NAD, currently confused Vitals:   05/19/19 1100 05/19/19 1230 05/19/19 1300 05/19/19 1330  BP: (!) 162/64 (!) 145/43 (!) 152/43 (!) 140/48  Pulse:  (!) 104 (!) 56   Resp: (!) 22 20 20 18   SpO2:  96% 94%   Weight:       Eyes: lids and conjunctivae normal ENMT: Mucous membranes are moist.  Neck: normal, supple Respiratory: clear to auscultation bilaterally. Normal respiratory effort. No accessory muscle use.  Currently on 3 L nasal cannula oxygen. Cardiovascular: Regular rate and rhythm, no murmurs. No extremity edema. Abdomen: no tenderness, no distention. Bowel sounds positive.  Musculoskeletal:  No joint deformity upper and lower extremities.   Skin: no rashes, lesions, ulcers.  Psychiatric: Difficult to assess given patient condition.  Labs on Admission: I have personally reviewed following labs and imaging studies  CBC: Recent Labs  Lab 05/19/19 0930  WBC 14.9*  NEUTROABS 13.2*  HGB 11.4*  HCT 36.7  MCV 97.1  PLT 448   Basic Metabolic Panel: Recent Labs  Lab 05/19/19 0930  NA 135  K 6.3*  CL 94*  CO2 20*    GLUCOSE 153*  BUN 90*  CREATININE 11.81*  CALCIUM 8.3*   GFR: Estimated Creatinine Clearance: 3.9 mL/min (A) (by C-G formula based on SCr of  11.81 mg/dL (H)). Liver Function Tests: Recent Labs  Lab 05/19/19 0930  AST 25  ALT 19  ALKPHOS 70  BILITOT 0.8  PROT 6.9  ALBUMIN 3.3*   No results for input(s): LIPASE, AMYLASE in the last 168 hours. Recent Labs  Lab 05/19/19 1051  AMMONIA 14   Coagulation Profile: No results for input(s): INR, PROTIME in the last 168 hours. Cardiac Enzymes: No results for input(s): CKTOTAL, CKMB, CKMBINDEX, TROPONINI in the last 168 hours. BNP (last 3 results) No results for input(s): PROBNP in the last 8760 hours. HbA1C: No results for input(s): HGBA1C in the last 72 hours. CBG: Recent Labs  Lab 05/19/19 1313  GLUCAP 275*   Lipid Profile: Recent Labs    05/19/19 1134  TRIG 271*   Thyroid Function Tests: No results for input(s): TSH, T4TOTAL, FREET4, T3FREE, THYROIDAB in the last 72 hours. Anemia Panel: Recent Labs    05/19/19 1134  FERRITIN 563*   Urine analysis:    Component Value Date/Time   COLORURINE AMBER (A) 05/19/2019 0919   APPEARANCEUR HAZY (A) 05/19/2019 0919   LABSPEC 1.016 05/19/2019 0919   PHURINE 5.0 05/19/2019 0919   GLUCOSEU NEGATIVE 05/19/2019 0919   HGBUR NEGATIVE 05/19/2019 0919   BILIRUBINUR NEGATIVE 05/19/2019 0919   KETONESUR NEGATIVE 05/19/2019 0919   PROTEINUR 100 (A) 05/19/2019 0919   UROBILINOGEN 0.2 02/08/2015 1615   NITRITE NEGATIVE 05/19/2019 0919   LEUKOCYTESUR NEGATIVE 05/19/2019 0919    Radiological Exams on Admission: DG Chest Port 1 View  Result Date: 05/19/2019 CLINICAL DATA:  Fever with weakness. Missed dialysis for 4 days. History of diabetes. EXAM: PORTABLE CHEST 1 VIEW COMPARISON:  Radiographs 01/05/2019 and 12/22/2018. FINDINGS: 0940 hours. Stable cardiomegaly post CABG. The left pleural effusion has resolved. There is vascular congestion with new ill-defined interstitial and  ground-glass opacities throughout both lungs. No consolidation or pneumothorax. The bones appear unremarkable. Telemetry leads overlie the chest. IMPRESSION: New ill-defined interstitial and ground-glass opacities throughout both lungs consistent with pulmonary edema or atypical infection. Result from COVID-19 testing performed today has not yet been reported. No focal airspace disease. Electronically Signed   By: Richardean Sale M.D.   On: 05/19/2019 09:57    EKG: Independently reviewed.  Sinus rhythm at 58 bpm with left posterior fascicular block.  Assessment/Plan Active Problems:   Acute hypoxemic respiratory failure due to COVID-19 [facility])    Acute hypoxemic respiratory failure secondary to COVID-19 infection -Associated weakness and confusion noted -Continue on dexamethasone and start remdesivir -Possible pneumonia with elevated procalcitonin and leukocytosis, therefore will start on azithromycin and Rocephin for now and monitor -Chest x-ray with new groundglass opacities noted -Monitor inflammatory markers -Wean oxygen as tolerated  Acute metabolic encephalopathy-multifactorial -Secondary to COVID-19 infection along with missed hemodialysis sessions  Hyperkalemia -Patient received hemodialysis and has received D50/insulin along with Lokelma -Monitor repeat in a.m. -Continue on telemetry -No significant EKG changes noted  Type 2 diabetes -With had significant hyperglycemia currently -Monitor carefully while on steroids and check A1c -Maintain on SSI  Hypertension -Continue amlodipine and metoprolol  CAD/prior CABG with postoperative atrial fibrillation -Continue on Coumadin with pharmacy to assist with management -We will check INR -Continue metoprolol and amiodarone and monitor on telemetry  Hypothyroidism -Continue home medications and check TSH  GERD -PPI   DVT prophylaxis: Coumadin Code Status: Full Family Communication: None at bedside Disposition Plan:  Treatment for COVID-19 infection as well as dialysis Consults called: Nephrology Admission status: Inpatient, telemetry   Arnesia Vincelette D Manuella Ghazi DO [facility] Pager [phone]  If 7PM-7AM, please contact night-coverage [email or web address] Password [email or web address]  05/19/2019, 2:02 PM

## 2019-05-19 NOTE — ED Notes (Signed)
[name] [phone]

## 2019-05-19 NOTE — ED Notes (Signed)
Date and time results received: 05/19/19 1412 (use smartphrase ".now" to insert current time)  Test: Lactic Acid Critical Value: 2.7 Name of Provider Notified: Dr Laverta Baltimore  Orders Received? Or Actions Taken?: NA

## 2019-05-19 NOTE — ED Notes (Signed)
Date and time results received: 05/19/19 1231 (use smartphrase ".now" to insert current time)  Test: Potassium Critical Value: 6.3  Name of Provider Notified: Dr Laverta Baltimore Orders Received? Or Actions Taken?: NA

## 2019-05-19 NOTE — Procedures (Signed)
    HEMODIALYSIS TREATMENT NOTE:  3 hour heparin-free treatment completed via right upper arm AVF (15g/antegrade). Goal met: 2 liters removed without interruption in UF.  All blood was returned.  Hemostasis was achieved in 10 minutes.  Rockwell Alexandria, RN

## 2019-05-19 NOTE — ED Notes (Signed)
Pt son given update.

## 2019-05-19 NOTE — Consult Note (Signed)
[facility] Renal Consultation Note    Indication for Consultation:  Management of ESRD/hemodialysis; anemia, hypertension/volume and secondary hyperparathyroidism  HPI: [name] is a 78 y.o. female.  Ms. Ravan has a PMH significant for CAD s/p NSTEMI and CABG x 4 (3/82/50) complicated by post-op atrial fibrillation on coumadin, DM, HTN, gout, combined systolic and diastolic CHF, ESRD on HD qMWF at [facility] who presented to [facility] ED with 2 day history of AMS.  She missed her last 2 HD sessions due to weakness.  In the ED she was noted to be febrile with temp of 99.3 and hypoxic and was started on supplemental oxygen.  COVID + in ED.  She is being admitted for further treatment of her COVID-19 infection and hypoxic respiratory failure.  We were consulted to provide HD while she remains an inpatient.   Past Medical History:  Diagnosis Date  . Anemia   . Arthritis   . Blood transfusion without reported diagnosis   . CAD (coronary artery disease)    a. 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  . Cataract   . Chronic anemia   . Chronic combined systolic and diastolic CHF (congestive heart failure) ([place])    a. 2D echo 08/2016 at [facility]: EF 50-55% with inferior wall HK, impaired LV filling, fair study.  . CKD (chronic kidney disease), stage III   . Diabetes ([place])   . Gastritis   . GERD (gastroesophageal reflux disease)   . Gout   . Headache   . HTN (hypertension)   . Hyperlipidemia   . Hypothyroidism   . Iron deficiency anemia 11/08/2016  . Normocytic anemia 10/26/2016  . NSTEMI (non-ST elevated myocardial infarction) ([place])    a. Complex admission 08/2016 - with severe hyperglycemia, AKI on CKD, severe anemia down to Hgb 6.8, acute combined CHF, troponin of 8.5, cath deferred due to renal dysfunction.  . Thyroid disease   . Wears dentures    Past Surgical History:  Procedure Laterality Date  . BASCILIC VEIN TRANSPOSITION Right 06/12/2018   Procedure: FIRST STAGE BASCILIC VEIN TRANSPOSITION RIGHT ARM;  Surgeon: Rosetta Posner, MD;  Location: [place];  Service: Vascular;  Laterality: Right;  . BASCILIC VEIN TRANSPOSITION Right 08/07/2018   Procedure: BASCILIC VEIN TRANSPOSITION SECOND STAGE RIGHT ARM;  Surgeon: Rosetta Posner, MD;  Location: [place];  Service: Vascular;  Laterality: Right;  . CATARACT EXTRACTION     left  . COLONOSCOPY WITH PROPOFOL N/A 10/08/2016   5 mm transverse colon polyp note resected due to plavix. hemorrhoids  . CORONARY ARTERY BYPASS GRAFT N/A 11/20/2018   Procedure: CORONARY ARTERY BYPASS GRAFTING (CABG), ON PUMP, TIMES four, USING LEFT INTERNAL MAMMARY ARTERY AND ENDOSCOPICALLY HARVESTED RIGHT GREATER SAPHENOUS VEIN;  Surgeon: Melrose Nakayama, MD;  Location: [place];  Service: Open Heart Surgery;  Laterality: N/A;  . ENTEROSCOPY N/A 08/08/2017   Procedure: ENTEROSCOPY;  Surgeon: Daneil Dolin, MD;  [name];  Service: Endoscopy;  Laterality: N/A;  . ESOPHAGOGASTRODUODENOSCOPY N/A 10/06/2016   mild chroni gastritis, negative H.pylori  . ESOPHAGOGASTRODUODENOSCOPY (EGD) WITH PROPOFOL N/A 03/12/2017   mild edema/erythema of stomach, small bowel biopsy with focal villous tip lymphocytosis, ?partially developed celiac  . 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 GIVENS CAPSULE STUDY  10/08/2016  normal  . GIVENS CAPSULE STUDY N/A 10/29/2016   occasional gastric erosion, unremarkable small bowel  . LEFT HEART CATH AND CORONARY ANGIOGRAPHY N/A 11/12/2018   Procedure: LEFT HEART CATH AND CORONARY ANGIOGRAPHY;  Surgeon: Burnell Blanks, MD;  Location: [place] [facility];  Service: Cardiovascular;  Laterality: N/A;  . ORIF HUMERUS FRACTURE Left 02/07/2015   Procedure: OPEN  REDUCTION INTERNAL FIXATION (ORIF) PROXIMAL HUMERUS FRACTURE;  Surgeon: Marybelle Killings, MD;  Location: [place];  Service: Orthopedics;  Laterality: Left;  . TEE WITHOUT CARDIOVERSION N/A 11/20/2018   Procedure: TRANSESOPHAGEAL ECHOCARDIOGRAM (TEE);  Surgeon: Melrose Nakayama, MD;  Location: [place];  Service: Open Heart Surgery;  Laterality: N/A;  . teeth extractions    . THYROID SURGERY    . TOTAL HIP ARTHROPLASTY Left 02/07/2015   Procedure: TOTAL HIP ARTHROPLASTY ANTERIOR APPROACH ;  Surgeon: Marybelle Killings, MD;  Location: [place];  Service: Orthopedics;  Laterality: Left;  . WRIST SURGERY Left    Family History:   Family History  Problem Relation Age of Onset  . Diabetes Mother   . Asthma Mother   . Early death Mother 19       Pneumonia  . Early death Father        Killed at rodeo  . CAD Neg Hx   . GI Bleed Neg Hx    Social History:  reports that she has never smoked. She has never used smokeless tobacco. She reports that she does not drink alcohol or use drugs. Allergies  Allergen Reactions  . Insulin Glargine Swelling    "Makes me swell like a balloon all over", including face, but without any respiratory distress or rashes. Associated with weight gain.  . Statins Other (See Comments)    "I've tried them all; my muscle aches were so bad I couldn't walk".   Prior to Admission medications   Medication Sig Start Date End Date Taking? Authorizing Provider  acetaminophen (TYLENOL) 325 MG tablet Take 2 tablets (650 mg total) by mouth every 6 (six) hours as needed for mild pain or headache. 11/28/18   Nani Skillern, PA-C  Alcohol Swabs (B-D SINGLE USE SWABS REGULAR) PADS  02/26/18   [provider]  amiodarone (PACERONE) 200 MG tablet Take 1 tablet (200 mg total) by mouth daily. 01/28/19   Arnoldo Lenis, MD  amLODipine (NORVASC) 10 MG tablet Take 1 tablet by mouth once daily 05/12/19   Hilty, Nadean Corwin, MD  Ascorbic Acid (VITAMIN C) 100 MG tablet Take 1 tablet (100 mg  total) by mouth daily. 11/28/18   Nani Skillern, PA-C  aspirin EC 81 MG EC tablet Take 1 tablet (81 mg total) by mouth daily. 11/28/18   Nani Skillern, PA-C  BD PEN NEEDLE MICRO U/F 32G X 6 MM MISC  08/04/18   [provider]  calcium acetate (PHOSLO) 667 MG capsule Take 667 mg by mouth 3 (three) times daily with meals.    [provider]  Cholecalciferol (VITAMIN D-3) 125 MCG (5000 UT) TABS Take 5,000 mg by mouth 3 (three) times a week.     [provider]  Evolocumab (REPATHA SURECLICK) 665 MG/ML SOAJ Inject 140 mg into the skin every 14 (fourteen) days. 01/06/19   Arnoldo Lenis, MD  febuxostat (ULORIC) 40 MG tablet Take 1 tablet by mouth once daily 02/17/19   Anastasio Champion, Nimish C, MD  gabapentin (NEURONTIN) 300 MG capsule TAKE 2 CAPSULES BY MOUTH ONCE DAILY AT NIGHT 03/17/19  04/28/19  Doree Albee, MD  metoprolol succinate (TOPROL XL) 25 MG 24 hr tablet Take 0.5 tablets (12.5 mg total) by mouth daily. 11/28/18 11/28/19  Nani Skillern, PA-C  Multiple Vitamin (MULTIVITAMIN WITH MINERALS) TABS tablet Take 1 tablet by mouth daily.    [provider]  NP THYROID 120 MG tablet Take 1 tablet by mouth once daily 02/01/19 03/03/19  Hurshel Party C, MD  pantoprazole (PROTONIX) 40 MG tablet Take 1 tablet (40 mg total) by mouth 2 (two) times daily. 30 minutes before breakfast Patient taking differently: Take 40 mg by mouth daily before breakfast.  11/27/17   Annitta Needs, NP  warfarin (COUMADIN) 2.5 MG tablet Take 1 1/2 tablets daily or as directed 01/05/19   Arnoldo Lenis, MD  gabapentin (NEURONTIN) 300 MG capsule Take 2 capsules (600 mg total) by mouth at bedtime. 11/18/17   Mahala Menghini, PA-C  gabapentin (NEURONTIN) 300 MG capsule TAKE 2 CAPSULES BY MOUTH ONCE DAILY AT NIGHT 01/30/19   Doree Albee, MD  NP THYROID 120 MG tablet Take 120 mg by mouth daily. 08/28/18   [provider]   Current Facility-Administered Medications  Medication  Dose Route Frequency Provider Last Rate Last Admin  . [START ON 05/20/2019] Chlorhexidine Gluconate Cloth 2 % PADS 6 each  6 each Topical Q0600 Donato Heinz, MD       Current Outpatient Medications  Medication Sig Dispense Refill  . acetaminophen (TYLENOL) 325 MG tablet Take 2 tablets (650 mg total) by mouth every 6 (six) hours as needed for mild pain or headache.    . Alcohol Swabs (B-D SINGLE USE SWABS REGULAR) PADS     . amiodarone (PACERONE) 200 MG tablet Take 1 tablet (200 mg total) by mouth daily. 30 tablet 6  . amLODipine (NORVASC) 10 MG tablet Take 1 tablet by mouth once daily 90 tablet 1  . Ascorbic Acid (VITAMIN C) 100 MG tablet Take 1 tablet (100 mg total) by mouth daily.    Marland Kitchen aspirin EC 81 MG EC tablet Take 1 tablet (81 mg total) by mouth daily.    . BD PEN NEEDLE MICRO U/F 32G X 6 MM MISC     . calcium acetate (PHOSLO) 667 MG capsule Take 667 mg by mouth 3 (three) times daily with meals.    . Cholecalciferol (VITAMIN D-3) 125 MCG (5000 UT) TABS Take 5,000 mg by mouth 3 (three) times a week.     . Evolocumab (REPATHA SURECLICK) 176 MG/ML SOAJ Inject 140 mg into the skin every 14 (fourteen) days. 2 pen 12  . febuxostat (ULORIC) 40 MG tablet Take 1 tablet by mouth once daily 90 tablet 0  . gabapentin (NEURONTIN) 300 MG capsule TAKE 2 CAPSULES BY MOUTH ONCE DAILY AT NIGHT 60 capsule 3  . metoprolol succinate (TOPROL XL) 25 MG 24 hr tablet Take 0.5 tablets (12.5 mg total) by mouth daily. 30 tablet 11  . Multiple Vitamin (MULTIVITAMIN WITH MINERALS) TABS tablet Take 1 tablet by mouth daily.    . NP THYROID 120 MG tablet Take 1 tablet by mouth once daily 30 tablet 3  . pantoprazole (PROTONIX) 40 MG tablet Take 1 tablet (40 mg total) by mouth 2 (two) times daily. 30 minutes before breakfast (Patient taking differently: Take 40 mg by mouth daily before breakfast. ) 180 tablet 3  . warfarin (COUMADIN) 2.5 MG tablet Take 1 1/2 tablets daily or as directed 50 tablet 3   Labs: Basic  Metabolic Panel:  Recent Labs  Lab 05/19/19 0930  NA 135  K 6.3*  CL 94*  CO2 20*  GLUCOSE 153*  BUN 90*  CREATININE 11.81*  CALCIUM 8.3*   Liver Function Tests: Recent Labs  Lab 05/19/19 0930  AST 25  ALT 19  ALKPHOS 70  BILITOT 0.8  PROT 6.9  ALBUMIN 3.3*   No results for input(s): LIPASE, AMYLASE in the last 168 hours. Recent Labs  Lab 05/19/19 1051  AMMONIA 14   CBC: Recent Labs  Lab 05/19/19 0930  WBC 14.9*  NEUTROABS 13.2*  HGB 11.4*  HCT 36.7  MCV 97.1  PLT 324   Cardiac Enzymes: No results for input(s): CKTOTAL, CKMB, CKMBINDEX, TROPONINI in the last 168 hours. CBG: No results for input(s): GLUCAP in the last 168 hours. Iron Studies:  Recent Labs    05/19/19 1134  FERRITIN 563*   Studies/Results: No results found.  ROS: Pertinent items are noted in HPI. Physical Exam: Vitals:   05/19/19 0915 05/19/19 0917 05/19/19 1030 05/19/19 1100  BP:  (!) 153/50 (!) 160/80 (!) 162/64  Pulse:  (!) 58    Resp:  12 (!) 21 (!) 22  SpO2:  (!) 80%    Weight: 77.8 kg         Weight change:  No intake or output data in the 24 hours ending 05/19/19 1307 BP (!) 162/64   Pulse (!) 58   Resp (!) 22   Wt 77.8 kg   SpO2 (!) 80%   BMI 29.44 kg/m  Physical exam: unable to complete due to COVID + status.  In order to preserve PPE equipment and to minimize exposure to providers.  Notes from other caregivers reviewed Dialysis Access:  Dialysis Orders: [facility]: [facility] on MWF . EDW 77kg HD Bath 2K/2.25Ca  Time 4 hours Heparin none. Access RUE AVF BFR 300 DFR 600    Epogen 7600  Units IV/HD  Venofer  50 mg IV qweek    Assessment/Plan: 1.  Acute hypoxic respiratory failure due to COVID-19 infection- currently on 3 liters via [place].  Remdesivir/dexamethasone per primary svc 2.  ESRD -  Missed her last 2 treatments and now with hyperkalemia.  Will plan for HD today here as long as she remains stable 3.  Hypertension/volume  - she is near her edw but will UF as  tolerated 4.  Anemia  - continue with ESA while she remains an inpatient.  Will hold dose today given Hgb >46. 5.  Metabolic bone disease -  Continue with home meds and renal diet 6.  Nutrition - has history of moderate protein malnutrition and is on liquicel protein supplements. 7. Post-operative atrial fibrillation- on coumadin   Donetta Potts, MD [place] Pager [phone] 05/19/2019, 1:07 PM

## 2019-05-19 NOTE — ED Notes (Signed)
Have updated family member

## 2019-05-20 LAB — PROTIME-INR
INR: 1.3 — ABNORMAL HIGH (ref 0.8–1.2)
Prothrombin Time: 16.3 seconds — ABNORMAL HIGH (ref 11.4–15.2)

## 2019-05-20 LAB — COMPREHENSIVE METABOLIC PANEL
ALT: 24 U/L (ref 0–44)
AST: 31 U/L (ref 15–41)
Albumin: 3.1 g/dL — ABNORMAL LOW (ref 3.5–5.0)
Alkaline Phosphatase: 68 U/L (ref 38–126)
BUN: 59 mg/dL — ABNORMAL HIGH (ref 8–23)
CO2: 23 mmol/L (ref 22–32)
Calcium: 8.5 mg/dL — ABNORMAL LOW (ref 8.9–10.3)
Chloride: 90 mmol/L — ABNORMAL LOW (ref 98–111)
Creatinine, Ser: 7.06 mg/dL — ABNORMAL HIGH (ref 0.44–1.00)
GFR calc Af Amer: 6 mL/min — ABNORMAL LOW (ref >60)
GFR calc non Af Amer: 5 mL/min — ABNORMAL LOW (ref >60)
Glucose, Bld: 206 mg/dL — ABNORMAL HIGH (ref 70–99)
Potassium: 4 mmol/L (ref 3.5–5.1)
Sodium: 134 mmol/L — ABNORMAL LOW (ref 135–145)
Total Bilirubin: 0.5 mg/dL (ref 0.3–1.2)
Total Protein: 6.8 g/dL (ref 6.5–8.1)

## 2019-05-20 LAB — CBC
HCT: 36.2 % (ref 36.0–46.0)
HCT: 37.5 % (ref 36.0–46.0)
Hemoglobin: 11.3 g/dL — ABNORMAL LOW (ref 12.0–15.0)
Hemoglobin: 11.1 g/dL — ABNORMAL LOW (ref 12.0–15.0)
MCH: 29.5 pg (ref 26.0–34.0)
MCH: 29.1 pg (ref 26.0–34.0)
MCHC: 31.2 g/dL (ref 30.0–36.0)
MCHC: 29.6 g/dL — ABNORMAL LOW (ref 30.0–36.0)
MCV: 94.5 fL (ref 80.0–100.0)
MCV: 98.2 fL (ref 80.0–100.0)
Platelets: 263 10*3/uL (ref 150–400)
Platelets: 304 10*3/uL (ref 150–400)
RBC: 3.83 MIL/uL — ABNORMAL LOW (ref 3.87–5.11)
RBC: 3.82 MIL/uL — ABNORMAL LOW (ref 3.87–5.11)
RDW: 15.6 % — ABNORMAL HIGH (ref 11.5–15.5)
RDW: 15.3 % (ref 11.5–15.5)
WBC: 9 10*3/uL (ref 4.0–10.5)
WBC: 10.3 10*3/uL (ref 4.0–10.5)
nRBC: 0 % (ref 0.0–0.2)
nRBC: 0 % (ref 0.0–0.2)

## 2019-05-20 LAB — RENAL FUNCTION PANEL
Albumin: 2.8 g/dL — ABNORMAL LOW (ref 3.5–5.0)
Anion gap: >20 — ABNORMAL HIGH (ref 5–15)
BUN: 83 mg/dL — ABNORMAL HIGH (ref 8–23)
CO2: 19 mmol/L — ABNORMAL LOW (ref 22–32)
Calcium: 7.8 mg/dL — ABNORMAL LOW (ref 8.9–10.3)
Chloride: 92 mmol/L — ABNORMAL LOW (ref 98–111)
Creatinine, Ser: 7.88 mg/dL — ABNORMAL HIGH (ref 0.44–1.00)
GFR calc Af Amer: 5 mL/min — ABNORMAL LOW (ref >60)
GFR calc non Af Amer: 4 mL/min — ABNORMAL LOW (ref >60)
Glucose, Bld: 291 mg/dL — ABNORMAL HIGH (ref 70–99)
Phosphorus: 6 mg/dL — ABNORMAL HIGH (ref 2.5–4.6)
Potassium: 4.1 mmol/L (ref 3.5–5.1)
Sodium: 133 mmol/L — ABNORMAL LOW (ref 135–145)

## 2019-05-20 LAB — CBG MONITORING, ED
Glucose-Capillary: 171 mg/dL — ABNORMAL HIGH (ref 70–99)
Glucose-Capillary: 198 mg/dL — ABNORMAL HIGH (ref 70–99)
Glucose-Capillary: 250 mg/dL — ABNORMAL HIGH (ref 70–99)

## 2019-05-20 LAB — GLUCOSE, CAPILLARY
Glucose-Capillary: 239 mg/dL — ABNORMAL HIGH (ref 70–99)
Glucose-Capillary: 317 mg/dL — ABNORMAL HIGH (ref 70–99)

## 2019-05-20 LAB — MAGNESIUM: Magnesium: 2 mg/dL (ref 1.7–2.4)

## 2019-05-20 LAB — PROCALCITONIN: Procalcitonin: 1.72 ng/mL

## 2019-05-20 LAB — FERRITIN: Ferritin: 847 ng/mL — ABNORMAL HIGH (ref 11–307)

## 2019-05-20 LAB — LACTIC ACID, PLASMA: Lactic Acid, Venous: 1.8 mmol/L (ref 0.5–1.9)

## 2019-05-20 LAB — C-REACTIVE PROTEIN: CRP: 22 mg/dL — ABNORMAL HIGH (ref <1.0)

## 2019-05-20 MED ORDER — WARFARIN SODIUM 5 MG PO TABS
10.0000 mg | ORAL_TABLET | Freq: Once | ORAL | Status: AC
  Administered 2019-05-20: 10 mg via ORAL
  Filled 2019-05-20: qty 2

## 2019-05-20 MED ORDER — WARFARIN SODIUM 7.5 MG PO TABS
7.5000 mg | ORAL_TABLET | Freq: Once | ORAL | Status: DC
  Filled 2019-05-20: qty 1

## 2019-05-20 NOTE — TOC Initial Note (Addendum)
Transition of Care [facility]) - Initial/Assessment Note    Patient Details  Name: [name] MRN: [id number] Date of Birth: 02/16/1941  [facility] CM/SW Contact:    Ihor Gully, LCSW Phone Number: 05/20/2019, 2:35 PM  Clinical Narrative:                 Patient from home with significant other, Bob. Admitted with acute hypoxemic respiratory failure due to COVID 19. Ambulates with a walker at baseline. On MWF dialysis schedule at [facility] in [place]. Cannot return to [place] facility until 14 days after Covid infection and no symptom.  Joy at [facility] in [place] advised that patient needs temporary transfer to [facility] in [place]. Spoke with Shanice at [facility] [phone] who advised that she would speak to administrator about transfer possibility.  Son advised that patient's companion would need to feel comfortable with transport to [facility]. [facility] does not transport COVID patients, [facility] does not service this area.  TOC following.  Expected Discharge Plan: [place] Barriers to Discharge: Continued Medical Work up   Patient Goals and CMS Choice        Expected Discharge Plan and Services Expected Discharge Plan: [name]                                              Prior Living Arrangements/Services   Lives with:: Significant Other   Do you feel safe going back to the place where you live?: Yes          Current home services: DME(walker)    Activities of Daily Living Home Assistive Devices/Equipment: [place] Rile (specify type) ADL Screening (condition at time of admission) Patient's cognitive ability adequate to safely complete daily activities?: Yes Is the patient deaf or have difficulty hearing?: No Does the patient have difficulty seeing, even when wearing glasses/contacts?: No Does the patient have difficulty concentrating, remembering, or making decisions?: No Patient able to express need  for assistance with ADLs?: Yes Does the patient have difficulty dressing or bathing?: No Independently performs ADLs?: Yes (appropriate for developmental age) Does the patient have difficulty walking or climbing stairs?: Yes Weakness of Legs: Both Weakness of Arms/Hands: None  Permission Sought/Granted            Permission granted to share info w Relationship: son. Mr. Constantin     Emotional Assessment     Affect (typically observed): Unable to Assess Orientation: : Oriented to Self, Oriented to Situation, Oriented to Place, Oriented to  Time Alcohol / Substance Use: Not Applicable Psych Involvement: No (comment)  Admission diagnosis:  Acute hypoxemic respiratory failure due to COVID-19 ([place]) [U07.1, J96.01] Patient Active Problem List   Diagnosis Date Noted  . Acute hypoxemic respiratory failure due to COVID-19 ([place]) 05/19/2019  . Atrial fibrillation ([place]) 12/03/2018  . Encounter for therapeutic drug monitoring 12/03/2018  . Pressure injury of skin 11/24/2018  . CAD (coronary artery disease) 11/21/2018  . Acute on chronic diastolic CHF (congestive heart failure) ([place]) 11/21/2018  . Respiratory failure with hypoxia ([place]) 11/21/2018  . End-stage renal disease on hemodialysis ([place]) 11/21/2018  . Diabetes mellitus type 2, controlled, with complications ([place]) 63/84/6659  . 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 ESRD (end stage renal disease) on dialysis ([place]) 11/12/2018  . Non-ST elevation (NSTEMI) myocardial infarction ([place]) 11/06/2018  .  Polyp of transverse colon 10/08/2017  . Anemia 08/07/2017  . GI bleed 07/30/2017  . Hyperkalemia 07/30/2017  . Essential hypertension, benign 12/13/2016  . Hypothyroidism 12/13/2016  . Iron deficiency anemia 11/08/2016  . Normocytic anemia 10/26/2016  . Elevated troponin   . Aortic atherosclerosis ([place]) 10/25/2016  . Symptomatic anemia 10/04/2016  . History of non-ST elevation myocardial infarction (NSTEMI) 10/04/2016  . Mixed hyperlipidemia  10/04/2016  . Neuropathy 09/20/2016  . Chronic kidney disease, stage IV (severe) ([place]) 08/03/2015  . Hip fracture ([place]) 02/05/2015  . HTN (hypertension) 02/05/2015  . Gout 02/05/2015  . Left humeral fracture 02/05/2015  . Hyperlipidemia associated with type 2 diabetes mellitus ([place]) 05/01/2013   PCP:  Doree Albee, MD Pharmacy:   [facility] [address], [place] [address] Phone: [phone] Fax: [phone]  [facility] [address], [place] - [place] [address] [place] [address] [place] [address] Phone: [phone] Fax: [phone]  [facility] - [place], [place] [address] Phone: [phone] Fax: [phone]  [id number] - [place], [place] [address] [place] [address] Phone: [phone] Fax: [place], [place] [address] Phone: [phone] Fax: [phone]     Social Determinants of Health (SDOH) Interventions    Readmission Risk Interventions Readmission Risk Prevention Plan 11/27/2018 11/11/2018  Transportation Screening Complete Complete  Social Work Consult for [place] Planning/Counseling - Complete  Palliative Care Screening - Not Applicable  Medication Review Press photographer) Complete -  HRI or Home Care Consult Complete -  SW Recovery Care/Counseling Consult Complete -  [place] Not Applicable -  Some recent data might be hidden

## 2019-05-20 NOTE — ED Notes (Signed)
Patient resting with eyes closed. Checked with patient to see if she needs to use restroom, patient is on dialysis and does not make much urine output.

## 2019-05-20 NOTE — ED Notes (Signed)
Patient ate all of her breakfast tray and used the phone to call her husband

## 2019-05-20 NOTE — ED Notes (Signed)
Patient from [facility] [phone].  Contact: Daughter - Glennon Hamilton [phone].

## 2019-05-20 NOTE — Progress Notes (Signed)
Patient ID: [name], female   DOB: Oct 19, 1940, 78 y.o.   MRN: [id number] S: tolerated HD yesterday and with good oxygen saturations with 3 liters [place]. O:BP (!) 126/55   Pulse (!) 51   Temp 98 F (36.7 C)   Resp 11   Wt 77.8 kg   SpO2 99%   BMI 29.44 kg/m   Intake/Output Summary (Last 24 hours) at 05/20/2019 0930 Last data filed at 05/19/2019 1940 Gross per 24 hour  Intake 350.62 ml  Output 2000 ml  Net -1649.38 ml   Intake/Output: I/O last 3 completed shifts: In: 350.6 [IV Piggyback:350.6] Out: 2000 [Other:2000]  Intake/Output this shift:  No intake/output data recorded. Weight change:  Physical exam: unable to complete due to COVID + status.  In order to preserve PPE equipment and to minimize exposure to providers.  Notes from other caregivers reviewed   Recent Labs  Lab 05/19/19 0930 05/20/19 0644  NA 135 134*  K 6.3* 4.0  CL 94* 90*  CO2 20* 23  GLUCOSE 153* 206*  BUN 90* 59*  CREATININE 11.81* 7.06*  ALBUMIN 3.3* 3.1*  CALCIUM 8.3* 8.5*  AST 25 31  ALT 19 24   Liver Function Tests: Recent Labs  Lab 05/19/19 0930 05/20/19 0644  AST 25 31  ALT 19 24  ALKPHOS 70 68  BILITOT 0.8 0.5  PROT 6.9 6.8  ALBUMIN 3.3* 3.1*   No results for input(s): LIPASE, AMYLASE in the last 168 hours. Recent Labs  Lab 05/19/19 1051  AMMONIA 14   CBC: Recent Labs  Lab 05/19/19 0930 05/20/19 0450  WBC 14.9* 9.0  NEUTROABS 13.2*  --   HGB 11.4* 11.3*  HCT 36.7 36.2  MCV 97.1 94.5  PLT 324 263   Cardiac Enzymes: No results for input(s): CKTOTAL, CKMB, CKMBINDEX, TROPONINI in the last 168 hours. CBG: Recent Labs  Lab 05/19/19 1313 05/19/19 2153 05/20/19 0830 05/20/19 0916  GLUCAP 275* 192* 171* 198*    Iron Studies:  Recent Labs    05/20/19 0450  FERRITIN 847*   Studies/Results: DG Chest Port 1 View  Result Date: 05/19/2019 CLINICAL DATA:  Fever with weakness. Missed dialysis for 4 days. History of diabetes. EXAM: PORTABLE CHEST 1 VIEW  COMPARISON:  Radiographs 01/05/2019 and 12/22/2018. FINDINGS: 0940 hours. Stable cardiomegaly post CABG. The left pleural effusion has resolved. There is vascular congestion with new ill-defined interstitial and ground-glass opacities throughout both lungs. No consolidation or pneumothorax. The bones appear unremarkable. Telemetry leads overlie the chest. IMPRESSION: New ill-defined interstitial and ground-glass opacities throughout both lungs consistent with pulmonary edema or atypical infection. Result from COVID-19 testing performed today has not yet been reported. No focal airspace disease. Electronically Signed   By: Richardean Sale M.D.   On: 05/19/2019 09:57   . amiodarone  200 mg Oral Daily  . amLODipine  10 mg Oral Daily  . aspirin EC  81 mg Oral Daily  . calcium acetate  2,001 mg Oral TID WC  . Chlorhexidine Gluconate Cloth  6 each Topical Q0600  . cholecalciferol  5,000 Units Oral Once per day on Mon Wed Fri  . dexamethasone (DECADRON) injection  6 mg Intravenous Q24H  . febuxostat  40 mg Oral Daily  . insulin aspart  0-5 Units Subcutaneous QHS  . insulin aspart  0-6 Units Subcutaneous TID WC  . metoprolol succinate  12.5 mg Oral Daily  . multivitamin with minerals  1 tablet Oral Daily  . pantoprazole  40 mg Oral QAC  breakfast  . sodium bicarbonate  650 mg Oral BID  . sodium chloride flush  3 mL Intravenous Q12H  . thyroid  120 mg Oral Daily  . vitamin C  250 mg Oral Daily  . warfarin  7.5 mg Oral Once  . Warfarin - Pharmacist Dosing Inpatient   Does not apply q1800    BMET    Component Value Date/Time   NA 134 (L) 05/20/2019 0644   K 4.0 05/20/2019 0644   CL 90 (L) 05/20/2019 0644   CO2 23 05/20/2019 0644   GLUCOSE 206 (H) 05/20/2019 0644   BUN 59 (H) 05/20/2019 0644   CREATININE 7.06 (H) 05/20/2019 0644   CREATININE 6.21 (H) 02/09/2019 1151   CALCIUM 8.5 (L) 05/20/2019 0644   CALCIUM 8.6 (L) 03/28/2018 1207   GFRNONAA 5 (L) 05/20/2019 0644   GFRNONAA 6 (L) 02/09/2019  1151   GFRAA 6 (L) 05/20/2019 0644   GFRAA 7 (L) 02/09/2019 1151   CBC    Component Value Date/Time   WBC 9.0 05/20/2019 0450   RBC 3.83 (L) 05/20/2019 0450   HGB 11.3 (L) 05/20/2019 0450   HCT 36.2 05/20/2019 0450   PLT 263 05/20/2019 0450   MCV 94.5 05/20/2019 0450   MCH 29.5 05/20/2019 0450   MCHC 31.2 05/20/2019 0450   RDW 15.6 (H) 05/20/2019 0450   LYMPHSABS 1.0 05/19/2019 0930   MONOABS 0.6 05/19/2019 0930   EOSABS 0.0 05/19/2019 0930   BASOSABS 0.0 05/19/2019 0930     Dialysis Orders: Center: [name] on MWF . EDW 77kg HD Bath 2K/2.25Ca  Time 4 hours Heparin none. Access RUE AVF BFR 300 DFR 600    Epogen 7600  Units IV/HD  Venofer  50 mg IV qweek    Assessment/Plan: 1.  Acute hypoxic respiratory failure due to COVID-19 infection- currently on 3 liters via [place].  Remdesivir/dexamethasone per primary svc 2.  ESRD -  tolerated HD on 05/19/19 with improvement of hyperkalemia. 1. Off of her schedule but due to holidays will plan for HD tomorrow and again on Saturday. 2. She will need to be transferred to [facility] HD center (which is the covid unit) 3.  Hypertension/volume  - she is near her edw and tolerated 2 liters UF well 4.  Anemia  - continue with ESA while she remains an inpatient.  Will hold dose today given Hgb >17. 5.  Metabolic bone disease -  Continue with home meds and renal diet 6.  Nutrition - has history of moderate protein malnutrition and is on liquicel protein supplements. 7. Post-operative atrial fibrillation- on coumadin    Donetta Potts, MD [facility] [phone]

## 2019-05-20 NOTE — ED Notes (Signed)
Pt on bedpan.

## 2019-05-20 NOTE — ED Notes (Signed)
Patient resting at this time. Patient stated that she was cold, gave patient two warm blankets. Patient repositioned in bed.

## 2019-05-20 NOTE — Progress Notes (Signed)
PROGRESS NOTE    [name]  QPR:[id number] DOB: 11-12-1940 DOA: 05/19/2019 PCP: Doree Albee, MD   Brief Narrative:  Per HPI: [name] is a 78 y.o. female with medical history significant for CAD status post NSTEMI and CABG x4 (11/20/2018) with postop atrial fibrillation on Coumadin, diabetes, hypothyroidism, hypertension, gout, ESRD on HD MWF, and diastolic CHF with LVEF 99-35% along with pulmonary hypertension, who presented to the ED with 2-day history of altered mentation.  She missed her last 2 hemodialysis sessions due to some persistent weakness and confusion.  She has not been noted to be short of breath or have any chest pain, nausea, vomiting, or diarrhea.  History is difficult to obtain on account of her confusion.  12/23: Patient was admitted with acute hypoxemic respiratory failure secondary to COVID-19 infection and was started on dexamethasone and remdesivir.  She still remains on 3 L nasal cannula oxygen.  She was also noted to have acute metabolic encephalopathy along with hyperkalemia in the setting of missed hemodialysis sessions which now appear to be improved.  She has received hemodialysis on 12/22 with improvement in potassium levels.  Assessment & Plan:   Active Problems:   Acute hypoxemic respiratory failure due to COVID-19 [facility])   Acute hypoxemic respiratory failure secondary to COVID-19 infection -Associated weakness and confusion noted-improving -Continue to wean O2 as tolerated -Continue on dexamethasone and start remdesivir -Possible pneumonia with elevated procalcitonin and leukocytosis, therefore will start on azithromycin and Rocephin for now and monitor -Chest x-ray with new groundglass opacities noted -Continue to monitor inflammatory markers -Wean oxygen as tolerated  Acute metabolic encephalopathy-multifactorial -Secondary to COVID-19 infection along with missed hemodialysis sessions -Improving this  am  Hyperkalemia-resolved -Patient received hemodialysis and has received D50/insulin along with Lokelma -S/P HD on 12/22 -Monitor repeat in a.m. -Continue on telemetry -No significant EKG changes noted  Type 2 diabetes -With significant hyperglycemia currently -Monitor carefully while on steroids and A1c 6.1% -Maintain on SSI  Hypertension-controlled -Continue amlodipine and metoprolol  CAD/prior CABG with postoperative atrial fibrillation -Continue on Coumadin with pharmacy to assist with management -INR subtherapeutic -Continue amiodarone and hold metoprolol for now due to low HR  Hypothyroidism -Continue home medications and TSH 0.496  GERD -PPI   DVT prophylaxis: Coumadin Code Status: Full Family Communication: None at bedside Disposition Plan: Continue treatment for COVID-19 infection and monitor for hemodialysis.  Continue to wean oxygen levels.   Consultants:   Nephrology  Procedures:   Hemodialysis on 12/22  Antimicrobials:  Anti-infectives (From admission, onward)   Start     Dose/Rate Route Frequency Ordered Stop   05/20/19 1000  remdesivir 100 mg in sodium chloride 0.9 % 100 mL IVPB     100 mg 200 mL/hr over 30 Minutes Intravenous Daily 05/19/19 1351 05/24/19 0959   05/19/19 1500  remdesivir 200 mg in sodium chloride 0.9% 250 mL IVPB     200 mg 580 mL/hr over 30 Minutes Intravenous Once 05/19/19 1351 05/19/19 1549   05/19/19 1445  azithromycin (ZITHROMAX) 500 mg in sodium chloride 0.9 % 250 mL IVPB     500 mg 250 mL/hr over 60 Minutes Intravenous Every 24 hours 05/19/19 1436     05/19/19 1430  cefTRIAXone (ROCEPHIN) 1 g in sodium chloride 0.9 % 100 mL IVPB     1 g 200 mL/hr over 30 Minutes Intravenous Every 24 hours 05/19/19 1428     05/19/19 1430  azithromycin (ZITHROMAX) 250 mg in dextrose 5 % 125 mL  IVPB  Status:  Discontinued     250 mg 125 mL/hr over 60 Minutes Intravenous Every 24 hours 05/19/19 1428 05/19/19 1436        Subjective: Patient seen and evaluated today with no new acute complaints or concerns. No acute concerns or events noted overnight.  She remains on 3 L nasal cannula but appears to have improvement with her confusion today.  Objective: Vitals:   05/20/19 0300 05/20/19 0600 05/20/19 0730 05/20/19 0800  BP:   (!) 131/58 (!) 126/55  Pulse:  (!) 51 (!) 51 (!) 51  Resp:  12 (!) 9 11  Temp: 98 F (36.7 C)     SpO2:  99% 100% 99%  Weight:        Intake/Output Summary (Last 24 hours) at 05/20/2019 0922 Last data filed at 05/19/2019 1940 Gross per 24 hour  Intake 350.62 ml  Output 2000 ml  Net -1649.38 ml   Filed Weights   05/19/19 0915 05/19/19 1630  Weight: 77.8 kg 77.8 kg    Examination:  General exam: Appears calm and comfortable, less confused overall. Respiratory system: Clear to auscultation. Respiratory effort normal.  Currently on 3 L nasal cannula oxygen. Cardiovascular system: S1 & S2 heard, RRR. No JVD, murmurs, rubs, gallops or clicks. No pedal edema. Gastrointestinal system: Abdomen is nondistended, soft and nontender. No organomegaly or masses felt. Normal bowel sounds heard. Central nervous system: Alert and awake. Appears less confused. Extremities: Symmetric 5 x 5 power. Skin: No rashes, lesions or ulcers Psychiatry: Judgement and insight appear normal. Mood & affect appropriate.     Data Reviewed: I have personally reviewed following labs and imaging studies  CBC: Recent Labs  Lab 05/19/19 0930 05/20/19 0450  WBC 14.9* 9.0  NEUTROABS 13.2*  --   HGB 11.4* 11.3*  HCT 36.7 36.2  MCV 97.1 94.5  PLT 324 008   Basic Metabolic Panel: Recent Labs  Lab 05/19/19 0930 05/20/19 0644  NA 135 134*  K 6.3* 4.0  CL 94* 90*  CO2 20* 23  GLUCOSE 153* 206*  BUN 90* 59*  CREATININE 11.81* 7.06*  CALCIUM 8.3* 8.5*  MG  --  2.0   GFR: Estimated Creatinine Clearance: 6.5 mL/min (A) (by C-G formula based on SCr of 7.06 mg/dL (H)). Liver Function  Tests: Recent Labs  Lab 05/19/19 0930 05/20/19 0644  AST 25 31  ALT 19 24  ALKPHOS 70 68  BILITOT 0.8 0.5  PROT 6.9 6.8  ALBUMIN 3.3* 3.1*   No results for input(s): LIPASE, AMYLASE in the last 168 hours. Recent Labs  Lab 05/19/19 1051  AMMONIA 14   Coagulation Profile: Recent Labs  Lab 05/19/19 0930 05/20/19 0450  INR 1.4* 1.3*   Cardiac Enzymes: No results for input(s): CKTOTAL, CKMB, CKMBINDEX, TROPONINI in the last 168 hours. BNP (last 3 results) No results for input(s): PROBNP in the last 8760 hours. HbA1C: Recent Labs    05/19/19 0930  HGBA1C 6.1*   CBG: Recent Labs  Lab 05/19/19 1313 05/19/19 2153 05/20/19 0830 05/20/19 0916  GLUCAP 275* 192* 171* 198*   Lipid Profile: Recent Labs    05/19/19 1134  TRIG 271*   Thyroid Function Tests: Recent Labs    05/19/19 0930  TSH 0.496   Anemia Panel: Recent Labs    05/19/19 1134 05/20/19 0450  FERRITIN 563* 847*   Sepsis Labs: Recent Labs  Lab 05/19/19 0930 05/19/19 1324 05/20/19 0450  PROCALCITON 1.08  --  1.72  LATICACIDVEN 2.4* 2.7* 1.8  Recent Results (from the past 240 hour(s))  Blood Culture (routine x 2)     Status: None (Preliminary result)   Collection Time: 05/19/19 11:34 AM   Specimen: BLOOD LEFT ARM  Result Value Ref Range Status   Specimen Description BLOOD LEFT ARM  Final   Special Requests   Final    BOTTLES DRAWN AEROBIC AND ANAEROBIC Blood Culture adequate volume   Culture   Final    NO GROWTH < 24 HOURS Performed at [facility], [address] [place], [place] [address]    Report Status PENDING  Incomplete  Blood Culture (routine x 2)     Status: None (Preliminary result)   Collection Time: 05/19/19  1:24 PM   Specimen: BLOOD LEFT HAND  Result Value Ref Range Status   Specimen Description BLOOD LEFT HAND  Final   Special Requests   Final    BOTTLES DRAWN AEROBIC AND ANAEROBIC Blood Culture adequate volume   Culture   Final    NO GROWTH < 24 HOURS Performed  at [facility], [address] [place], [place] [address]    Report Status PENDING  Incomplete         Radiology Studies: DG Chest Port 1 View  Result Date: 05/19/2019 CLINICAL DATA:  Fever with weakness. Missed dialysis for 4 days. History of diabetes. EXAM: PORTABLE CHEST 1 VIEW COMPARISON:  Radiographs 01/05/2019 and 12/22/2018. FINDINGS: 0940 hours. Stable cardiomegaly post CABG. The left pleural effusion has resolved. There is vascular congestion with new ill-defined interstitial and ground-glass opacities throughout both lungs. No consolidation or pneumothorax. The bones appear unremarkable. Telemetry leads overlie the chest. IMPRESSION: New ill-defined interstitial and ground-glass opacities throughout both lungs consistent with pulmonary edema or atypical infection. Result from COVID-19 testing performed today has not yet been reported. No focal airspace disease. Electronically Signed   By: Richardean Sale M.D.   On: 05/19/2019 09:57        Scheduled Meds: . amiodarone  200 mg Oral Daily  . amLODipine  10 mg Oral Daily  . aspirin EC  81 mg Oral Daily  . calcium acetate  2,001 mg Oral TID WC  . Chlorhexidine Gluconate Cloth  6 each Topical Q0600  . cholecalciferol  5,000 Units Oral Once per day on Mon Wed Fri  . dexamethasone (DECADRON) injection  6 mg Intravenous Q24H  . febuxostat  40 mg Oral Daily  . insulin aspart  0-5 Units Subcutaneous QHS  . insulin aspart  0-6 Units Subcutaneous TID WC  . metoprolol succinate  12.5 mg Oral Daily  . multivitamin with minerals  1 tablet Oral Daily  . pantoprazole  40 mg Oral QAC breakfast  . sodium bicarbonate  650 mg Oral BID  . sodium chloride flush  3 mL Intravenous Q12H  . thyroid  120 mg Oral Daily  . vitamin C  250 mg Oral Daily  . Warfarin - Pharmacist Dosing Inpatient   Does not apply q1800   Continuous Infusions: . sodium chloride    . sodium chloride    . sodium chloride    . azithromycin 500 mg (05/19/19 1548)  .  cefTRIAXone (ROCEPHIN)  IV Stopped (05/19/19 1545)  . remdesivir 100 mg in NS 100 mL       LOS: 1 day    Time spent: 30 minutes    Bryla Burek Darleen Crocker, DO [facility] Pager [phone]  If 7PM-7AM, please contact night-coverage [email or web address] Password [email or web address] 05/20/2019, 9:22 AM

## 2019-05-20 NOTE — Progress Notes (Addendum)
ANTICOAGULATION CONSULT NOTE -   Pharmacy Consult for warfarin Indication: atrial fibrillation  Allergies  Allergen Reactions  . Insulin Glargine Swelling    "Makes me swell like a balloon all over", including face, but without any respiratory distress or rashes. Associated with weight gain.  . Statins Other (See Comments)    "I've tried them all; my muscle aches were so bad I couldn't walk".    Patient Measurements: Weight: 171 lb 8.3 oz (77.8 kg)  Vital Signs: Temp: 98 F (36.7 C) (12/23 0300) BP: 126/55 (12/23 0800) Pulse Rate: 51 (12/23 0800)  Labs: Recent Labs    05/19/19 0930 05/20/19 0450 05/20/19 0644  HGB 11.4* 11.3*  --   HCT 36.7 36.2  --   PLT 324 263  --   LABPROT 16.6* 16.3*  --   INR 1.4* 1.3*  --   CREATININE 11.81*  --  7.06*    Estimated Creatinine Clearance: 6.5 mL/min (A) (by C-G formula based on SCr of 7.06 mg/dL (H)).   Medical History: Past Medical History:  Diagnosis Date  . Anemia   . Arthritis   . Blood transfusion without reported diagnosis   . CAD (coronary artery disease)    a. 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  . Cataract   . Chronic anemia   . Chronic combined systolic and diastolic CHF (congestive heart failure) ([place])    a. 2D echo 08/2016 at [facility]: EF 50-55% with inferior wall HK, impaired LV filling, fair study.  . CKD (chronic kidney disease), stage III   . Diabetes ([place])   . Gastritis   . GERD (gastroesophageal reflux disease)   . Gout   . Headache   . HTN (hypertension)   . Hyperlipidemia   . Hypothyroidism   . Iron deficiency anemia 11/08/2016  . Normocytic anemia 10/26/2016  . NSTEMI (non-ST elevated myocardial infarction) ([place])    a. Complex admission 08/2016 - with severe hyperglycemia, AKI on CKD, severe anemia down to Hgb 6.8, acute combined CHF, troponin of 8.5, cath deferred due to renal dysfunction.  . Thyroid disease   . Wears dentures     Medications:  (Not in a hospital  admission)   Assessment: Pharmacy consulted to dose warfarin in patient with atrial fibrillation. Patient's INR on admission is 1.4. Home dose listed as 5 mg Tue-Thu-Sat and 7.5 mg ROW per anticoag clinic.  Per RN/patient, unsure of last dose but has not taken today 12/22.  INR 1.3, remains subtherapeutic. Will give booster dose, since INR is lower.  Goal of Therapy:  INR 2-3 Monitor platelets by anticoagulation protocol: Yes   Plan:  Warfarin 10 mg x 1 dose for subtherapeutic INR Daily PT-INR Monitor labs and s/s of bleeding.  Isac Sarna, BS [facility] [place], BCPS Clinical Pharmacist Pager [phone] 05/20/2019,9:26 AM

## 2019-05-21 ENCOUNTER — Other Ambulatory Visit: Payer: Self-pay

## 2019-05-21 ENCOUNTER — Encounter (HOSPITAL_COMMUNITY): Payer: Self-pay | Admitting: Internal Medicine

## 2019-05-21 LAB — PROCALCITONIN: Procalcitonin: 1.47 ng/mL

## 2019-05-21 LAB — COMPREHENSIVE METABOLIC PANEL
ALT: 28 U/L (ref 0–44)
AST: 29 U/L (ref 15–41)
Albumin: 2.8 g/dL — ABNORMAL LOW (ref 3.5–5.0)
Alkaline Phosphatase: 64 U/L (ref 38–126)
BUN: 103 mg/dL — ABNORMAL HIGH (ref 8–23)
CO2: 21 mmol/L — ABNORMAL LOW (ref 22–32)
Calcium: 7.6 mg/dL — ABNORMAL LOW (ref 8.9–10.3)
Chloride: 91 mmol/L — ABNORMAL LOW (ref 98–111)
Creatinine, Ser: 8.46 mg/dL — ABNORMAL HIGH (ref 0.44–1.00)
GFR calc Af Amer: 5 mL/min — ABNORMAL LOW (ref >60)
GFR calc non Af Amer: 4 mL/min — ABNORMAL LOW (ref >60)
Glucose, Bld: 221 mg/dL — ABNORMAL HIGH (ref 70–99)
Potassium: 4.3 mmol/L (ref 3.5–5.1)
Sodium: 133 mmol/L — ABNORMAL LOW (ref 135–145)
Total Bilirubin: 0.5 mg/dL (ref 0.3–1.2)
Total Protein: 6.2 g/dL — ABNORMAL LOW (ref 6.5–8.1)

## 2019-05-21 LAB — URINE CULTURE
Culture: NO GROWTH
Special Requests: NORMAL

## 2019-05-21 LAB — GLUCOSE, CAPILLARY
Glucose-Capillary: 254 mg/dL — ABNORMAL HIGH (ref 70–99)
Glucose-Capillary: 236 mg/dL — ABNORMAL HIGH (ref 70–99)
Glucose-Capillary: 331 mg/dL — ABNORMAL HIGH (ref 70–99)
Glucose-Capillary: 330 mg/dL — ABNORMAL HIGH (ref 70–99)

## 2019-05-21 LAB — CBC
HCT: 35.2 % — ABNORMAL LOW (ref 36.0–46.0)
Hemoglobin: 11 g/dL — ABNORMAL LOW (ref 12.0–15.0)
MCH: 29.3 pg (ref 26.0–34.0)
MCHC: 31.3 g/dL (ref 30.0–36.0)
MCV: 93.6 fL (ref 80.0–100.0)
Platelets: 287 10*3/uL (ref 150–400)
RBC: 3.76 MIL/uL — ABNORMAL LOW (ref 3.87–5.11)
RDW: 15.2 % (ref 11.5–15.5)
WBC: 11.4 10*3/uL — ABNORMAL HIGH (ref 4.0–10.5)
nRBC: 0 % (ref 0.0–0.2)

## 2019-05-21 LAB — HEPATITIS B SURFACE ANTIGEN: Hepatitis B Surface Ag: NONREACTIVE

## 2019-05-21 LAB — PROTIME-INR
INR: 1.8 — ABNORMAL HIGH (ref 0.8–1.2)
Prothrombin Time: 20.3 seconds — ABNORMAL HIGH (ref 11.4–15.2)

## 2019-05-21 LAB — C-REACTIVE PROTEIN: CRP: 16.8 mg/dL — ABNORMAL HIGH (ref <1.0)

## 2019-05-21 LAB — FERRITIN: Ferritin: 1151 ng/mL — ABNORMAL HIGH (ref 11–307)

## 2019-05-21 MED ORDER — PHENOL 1.4 % MT LIQD
1.0000 | OROMUCOSAL | Status: DC | PRN
  Administered 2019-05-22: 1 via OROMUCOSAL
  Filled 2019-05-21: qty 177

## 2019-05-21 MED ORDER — WARFARIN SODIUM 5 MG PO TABS
5.0000 mg | ORAL_TABLET | Freq: Once | ORAL | Status: AC
  Administered 2019-05-21: 5 mg via ORAL
  Filled 2019-05-21: qty 1

## 2019-05-21 MED ORDER — SALINE SPRAY 0.65 % NA SOLN
1.0000 | NASAL | Status: DC | PRN
  Filled 2019-05-21: qty 44

## 2019-05-21 MED ORDER — POLYETHYLENE GLYCOL 3350 17 G PO PACK
17.0000 g | PACK | Freq: Every day | ORAL | Status: DC
  Administered 2019-05-21 – 2019-05-22 (×2): 17 g via ORAL
  Filled 2019-05-21 (×3): qty 1

## 2019-05-21 NOTE — Progress Notes (Addendum)
Patient ID: [name], female   DOB: 03-23-1941, 78 y.o.   MRN: [id number] S: No events overnight. O:BP (!) 129/42 (BP Location: Left Arm)   Pulse (!) 54   Temp 97.7 F (36.5 C) (Oral)   Resp 20   Ht 5\' 4"  (1.626 m)   Wt 81.2 kg   SpO2 100%   BMI 30.73 kg/m   Intake/Output Summary (Last 24 hours) at 05/21/2019 1028 Last data filed at 05/21/2019 0900 Gross per 24 hour  Intake 1249.78 ml  Output --  Net 1249.78 ml   Intake/Output: I/O last 3 completed shifts: In: 1009.8 [P.O.:240; IV Piggyback:769.8] Out: 2000 [Other:2000]  Intake/Output this shift:  Total I/O In: 240 [P.O.:240] Out: -  Weight change: 3.4 kg Physical exam: unable to complete due to COVID + status.  In order to preserve PPE equipment and to minimize exposure to providers.  Notes from other caregivers reviewed   Recent Labs  Lab 05/19/19 0930 05/20/19 0644 05/20/19 2019 05/21/19 0712  NA 135 134* 133* 133*  K 6.3* 4.0 4.1 4.3  CL 94* 90* 92* 91*  CO2 20* 23 19* 21*  GLUCOSE 153* 206* 291* 221*  BUN 90* 59* 83* 103*  CREATININE 11.81* 7.06* 7.88* 8.46*  ALBUMIN 3.3* 3.1* 2.8* 2.8*  CALCIUM 8.3* 8.5* 7.8* 7.6*  PHOS  --   --  6.0*  --   AST 25 31  --  29  ALT 19 24  --  28   Liver Function Tests: Recent Labs  Lab 05/19/19 0930 05/20/19 0644 05/20/19 2019 05/21/19 0712  AST 25 31  --  29  ALT 19 24  --  28  ALKPHOS 70 68  --  64  BILITOT 0.8 0.5  --  0.5  PROT 6.9 6.8  --  6.2*  ALBUMIN 3.3* 3.1* 2.8* 2.8*   No results for input(s): LIPASE, AMYLASE in the last 168 hours. Recent Labs  Lab 05/19/19 1051  AMMONIA 14   CBC: Recent Labs  Lab 05/19/19 0930 05/20/19 0450 05/20/19 2019 05/21/19 0712  WBC 14.9* 9.0 10.3 11.4*  NEUTROABS 13.2*  --   --   --   HGB 11.4* 11.3* 11.1* 11.0*  HCT 36.7 36.2 37.5 35.2*  MCV 97.1 94.5 98.2 93.6  PLT 324 263 304 287   Cardiac Enzymes: No results for input(s): CKTOTAL, CKMB, CKMBINDEX, TROPONINI in the last 168 hours. CBG: Recent Labs   Lab 05/20/19 0916 05/20/19 1148 05/20/19 1600 05/20/19 2124 05/21/19 0718  GLUCAP 198* 250* 239* 317* 254*    Iron Studies:  Recent Labs    05/21/19 0712  FERRITIN 1,151*   Studies/Results: No results found. [place] Kitchen amiodarone  200 mg Oral Daily  . amLODipine  10 mg Oral Daily  . aspirin EC  81 mg Oral Daily  . calcium acetate  2,001 mg Oral TID WC  . Chlorhexidine Gluconate Cloth  6 each Topical Q0600  . cholecalciferol  5,000 Units Oral Once per day on Mon Wed Fri  . dexamethasone (DECADRON) injection  6 mg Intravenous Q24H  . febuxostat  40 mg Oral Daily  . insulin aspart  0-5 Units Subcutaneous QHS  . insulin aspart  0-6 Units Subcutaneous TID WC  . multivitamin with minerals  1 tablet Oral Daily  . pantoprazole  40 mg Oral QAC breakfast  . sodium bicarbonate  650 mg Oral BID  . thyroid  120 mg Oral Daily  . vitamin C  250 mg Oral Daily  .  warfarin  5 mg Oral ONCE-1800  . Warfarin - Pharmacist Dosing Inpatient   Does not apply q1800    BMET    Component Value Date/Time   NA 133 (L) 05/21/2019 0712   K 4.3 05/21/2019 0712   CL 91 (L) 05/21/2019 0712   CO2 21 (L) 05/21/2019 0712   GLUCOSE 221 (H) 05/21/2019 0712   BUN 103 (H) 05/21/2019 0712   CREATININE 8.46 (H) 05/21/2019 0712   CREATININE 6.21 (H) 02/09/2019 1151   CALCIUM 7.6 (L) 05/21/2019 0712   CALCIUM 8.6 (L) 03/28/2018 1207   GFRNONAA 4 (L) 05/21/2019 0712   GFRNONAA 6 (L) 02/09/2019 1151   GFRAA 5 (L) 05/21/2019 0712   GFRAA 7 (L) 02/09/2019 1151   CBC    Component Value Date/Time   WBC 11.4 (H) 05/21/2019 0712   RBC 3.76 (L) 05/21/2019 0712   HGB 11.0 (L) 05/21/2019 0712   HCT 35.2 (L) 05/21/2019 0712   PLT 287 05/21/2019 0712   MCV 93.6 05/21/2019 0712   MCH 29.3 05/21/2019 0712   MCHC 31.3 05/21/2019 0712   RDW 15.2 05/21/2019 0712   LYMPHSABS 1.0 05/19/2019 0930   MONOABS 0.6 05/19/2019 0930   EOSABS 0.0 05/19/2019 0930   BASOSABS 0.0 05/19/2019 0930    Dialysis Orders:  [facility]. EDW77kgHD Bath 2K/2.25CaTime 4 hoursHeparin none. AccessRUE AVFBFR 300DFR 600 [id number] IV/HD Venofer 50 mg IV qweek   Assessment/Plan: 1. Acute hypoxic respiratory failure due to COVID-19 infection- currently on 3 liters via [place]. Remdesivir/dexamethasone per primary svc 2. ESRD- tolerated HD on 05/19/19 with improvement of hyperkalemia. 1. Off of her schedule but due to holidays will plan for HD today and again on Saturday. 2. She will need to be transferred to [facility] HD center (which is the covid unit) once stable for discharge. 3. Will see her again on 05/23/19 3. Hypertension/volume- 4 liters above edw.  UF as tolerated todayl 4. Anemia- continue with ESA while she remains an inpatient. Will hold dose today given Hgb >55. 5. Metabolic bone disease- Continue with home meds and renal diet 6. Nutrition- has history of moderate protein malnutrition and is on liquicel protein supplements. 7. Post-operative atrial fibrillation- on coumadin  Donetta Potts, MD [facility] [phone]

## 2019-05-21 NOTE — Progress Notes (Signed)
PROGRESS NOTE    [name]  EYC:[id number] DOB: 1941-03-10 DOA: 05/19/2019 PCP: Doree Albee, MD   Brief Narrative:  Per HPI: Briana Tuccillo Croteauis a 78 y.o.femalewith medical history significant forCAD status post NSTEMI and CABG x4 (11/20/2018) with postop atrial fibrillation on Coumadin, diabetes, hypothyroidism, hypertension, gout, ESRD on HD MWF, and diastolic CHF with LVEF 14-97% along with pulmonary hypertension, who presented to the ED with 2-day history of altered mentation. She missed her last 2 hemodialysis sessions due to some persistent weakness and confusion. She has not been noted to be short of breath or have any chest pain, nausea, vomiting, or diarrhea. History is difficult to obtain on account of her confusion.  12/23: Patient was admitted with acute hypoxemic respiratory failure secondary to COVID-19 infection and was started on dexamethasone and remdesivir.  She still remains on 3 L nasal cannula oxygen.  She was also noted to have acute metabolic encephalopathy along with hyperkalemia in the setting of missed hemodialysis sessions which now appear to be improved.  She has received hemodialysis on 12/22 with improvement in potassium levels.  12/24: Patient continues to remain on 3 L nasal cannula oxygen and is having dryness of her nasal mucosa.  Plans for hemodialysis today as well as repeat on 12/26.  Assessment & Plan:   Active Problems:   Acute hypoxemic respiratory failure due to COVID-19 [facility])   Acute hypoxemic respiratory failure secondary to COVID-19 infection -Associated weakness and confusion noted-improving -Continue to wean O2 as tolerated to room air. -Continue on dexamethasone and remdesivir -Possible pneumonia with elevated procalcitonin and leukocytosis, therefore will start on azithromycin and Rocephin for now and monitor -Chest x-ray with new groundglass opacities noted -Continue to monitor inflammatory markers -Wean oxygen as  tolerated  Acute metabolic encephalopathy-resolved -Secondary to COVID-19 infection along with missed hemodialysis sessions -Improving this am  ESRD on HD -Appreciate nephrology recommendations with plans for hemodialysis 12/24 and repeat 12/26 -Plan for discharge after dialysis on 12/26 to new facility that can manage Covid patients  Hyperkalemia-resolved -Patient received hemodialysis and has received D50/insulin along with Lokelma -S/P HD on 12/22 -Monitor repeat in a.m. -Continue on telemetry -No significant EKG changes noted  Type 2 diabetes -With significant hyperglycemia currently -Monitor carefully while on steroids and A1c 6.1% -Maintain on SSI  Hypertension-controlled -Continue amlodipine and metoprolol  CAD/prior CABG with postoperative atrial fibrillation -Continue on Coumadin with pharmacy to assist with management -INR subtherapeutic -Continue amiodarone and hold metoprolol for now due to low HR  Hypothyroidism -Continue home medications and TSH 0.496  GERD -PPI   DVT prophylaxis: Coumadin Code Status: Full Family Communication: None at bedside Disposition Plan: Continue treatment for COVID-19 infection and monitor for hemodialysis.  Continue to wean oxygen levels.   Consultants:   Nephrology  Procedures:   Hemodialysis on 12/22  Antimicrobials:  Anti-infectives (From admission, onward)   Start     Dose/Rate Route Frequency Ordered Stop   05/20/19 1000  remdesivir 100 mg in sodium chloride 0.9 % 100 mL IVPB     100 mg 200 mL/hr over 30 Minutes Intravenous Daily 05/19/19 1351 05/24/19 0959   05/19/19 1500  remdesivir 200 mg in sodium chloride 0.9% 250 mL IVPB     200 mg 580 mL/hr over 30 Minutes Intravenous Once 05/19/19 1351 05/19/19 1549   05/19/19 1445  azithromycin (ZITHROMAX) 500 mg in sodium chloride 0.9 % 250 mL IVPB     500 mg 250 mL/hr over 60 Minutes Intravenous Every 24  hours 05/19/19 1436     05/19/19 1430   cefTRIAXone (ROCEPHIN) 1 g in sodium chloride 0.9 % 100 mL IVPB     1 g 200 mL/hr over 30 Minutes Intravenous Every 24 hours 05/19/19 1428     05/19/19 1430  azithromycin (ZITHROMAX) 250 mg in dextrose 5 % 125 mL IVPB  Status:  Discontinued     250 mg 125 mL/hr over 60 Minutes Intravenous Every 24 hours 05/19/19 1428 05/19/19 1436       Subjective: Patient seen and evaluated today with no new acute complaints or concerns. No acute concerns or events noted overnight.  She is complaining of some dry nasal mucosa with her oxygen.  She is more alert and awake and less confused.  Plans for hemodialysis today.  Objective: Vitals:   05/20/19 1530 05/20/19 1530 05/20/19 2127 05/21/19 0642  BP: (!) 127/47 (!) 127/47 (!) 115/45 (!) 129/42  Pulse: (!) 55 (!) 55 (!) 58 (!) 54  Resp: 18 18 20 20   Temp: 97.7 F (36.5 C) 97.7 F (36.5 C) 97.6 F (36.4 C) 97.7 F (36.5 C)  TempSrc: Oral Oral Oral Oral  SpO2:  100% 99% 100%  Weight: 81.2 kg     Height: 5\' 4"  (1.626 m)       Intake/Output Summary (Last 24 hours) at 05/21/2019 1157 Last data filed at 05/21/2019 0900 Gross per 24 hour  Intake 1249.78 ml  Output -  Net 1249.78 ml   Filed Weights   05/19/19 0915 05/19/19 1630 05/20/19 1530  Weight: 77.8 kg 77.8 kg 81.2 kg    Examination:  General exam: Appears calm and comfortable  Respiratory system: Clear to auscultation. Respiratory effort normal.  Currently on 3 L nasal cannula oxygen. Cardiovascular system: S1 & S2 heard, RRR. No JVD, murmurs, rubs, gallops or clicks. No pedal edema. Gastrointestinal system: Abdomen is nondistended, soft and nontender. No organomegaly or masses felt. Normal bowel sounds heard. Central nervous system: Alert and oriented. No focal neurological deficits. Extremities: Symmetric 5 x 5 power. Skin: No rashes, lesions or ulcers Psychiatry: Judgement and insight appear normal. Mood & affect appropriate.     Data Reviewed: I have personally reviewed  following labs and imaging studies  CBC: Recent Labs  Lab 05/19/19 0930 05/20/19 0450 05/20/19 2019 05/21/19 0712  WBC 14.9* 9.0 10.3 11.4*  NEUTROABS 13.2*  --   --   --   HGB 11.4* 11.3* 11.1* 11.0*  HCT 36.7 36.2 37.5 35.2*  MCV 97.1 94.5 98.2 93.6  PLT 324 263 304 932   Basic Metabolic Panel: Recent Labs  Lab 05/19/19 0930 05/20/19 0644 05/20/19 2019 05/21/19 0712  NA 135 134* 133* 133*  K 6.3* 4.0 4.1 4.3  CL 94* 90* 92* 91*  CO2 20* 23 19* 21*  GLUCOSE 153* 206* 291* 221*  BUN 90* 59* 83* 103*  CREATININE 11.81* 7.06* 7.88* 8.46*  CALCIUM 8.3* 8.5* 7.8* 7.6*  MG  --  2.0  --   --   PHOS  --   --  6.0*  --    GFR: Estimated Creatinine Clearance: 5.6 mL/min (A) (by C-G formula based on SCr of 8.46 mg/dL (H)). Liver Function Tests: Recent Labs  Lab 05/19/19 0930 05/20/19 0644 05/20/19 2019 05/21/19 0712  AST 25 31  --  29  ALT 19 24  --  28  ALKPHOS 70 68  --  64  BILITOT 0.8 0.5  --  0.5  PROT 6.9 6.8  --  6.2*  ALBUMIN 3.3* 3.1* 2.8* 2.8*   No results for input(s): LIPASE, AMYLASE in the last 168 hours. Recent Labs  Lab 05/19/19 1051  AMMONIA 14   Coagulation Profile: Recent Labs  Lab 05/19/19 0930 05/20/19 0450 05/21/19 0712  INR 1.4* 1.3* 1.8*   Cardiac Enzymes: No results for input(s): CKTOTAL, CKMB, CKMBINDEX, TROPONINI in the last 168 hours. BNP (last 3 results) No results for input(s): PROBNP in the last 8760 hours. HbA1C: Recent Labs    05/19/19 0930  HGBA1C 6.1*   CBG: Recent Labs  Lab 05/20/19 1148 05/20/19 1600 05/20/19 2124 05/21/19 0718 05/21/19 1055  GLUCAP 250* 239* 317* 254* 236*   Lipid Profile: Recent Labs    05/19/19 1134  TRIG 271*   Thyroid Function Tests: Recent Labs    05/19/19 0930  TSH 0.496   Anemia Panel: Recent Labs    05/20/19 0450 05/21/19 0712  FERRITIN 847* 1,151*   Sepsis Labs: Recent Labs  Lab 05/19/19 0930 05/19/19 1324 05/20/19 0450 05/21/19 0712  PROCALCITON 1.08  --   1.72 1.47  LATICACIDVEN 2.4* 2.7* 1.8  --     Recent Results (from the past 240 hour(s))  Urine culture     Status: None   Collection Time: 05/19/19  9:29 AM   Specimen: Urine, Catheterized  Result Value Ref Range Status   Specimen Description   Final    URINE, CATHETERIZED Performed at [facility], [address] [place], [place] [address]    Special Requests   Final    Normal Performed at [facility], [address] [place], [place] [address]    Culture   Final    NO GROWTH Performed at [facility], [place] [address], [place], [place] [address]    Report Status 05/21/2019 FINAL  Final  Blood Culture (routine x 2)     Status: None (Preliminary result)   Collection Time: 05/19/19 11:34 AM   Specimen: BLOOD LEFT ARM  Result Value Ref Range Status   Specimen Description BLOOD LEFT ARM  Final   Special Requests   Final    BOTTLES DRAWN AEROBIC AND ANAEROBIC Blood Culture adequate volume   Culture   Final    NO GROWTH 2 DAYS Performed at [facility], [address], [place], [place] [address]    Report Status PENDING  Incomplete  Blood Culture (routine x 2)     Status: None (Preliminary result)   Collection Time: 05/19/19  1:24 PM   Specimen: BLOOD LEFT HAND  Result Value Ref Range Status   Specimen Description BLOOD LEFT HAND  Final   Special Requests   Final    BOTTLES DRAWN AEROBIC AND ANAEROBIC Blood Culture adequate volume   Culture   Final    NO GROWTH 2 DAYS Performed at [facility], [address], [place], [place] [address]    Report Status PENDING  Incomplete         Radiology Studies: No results found.      Scheduled Meds: . amiodarone  200 mg Oral Daily  . amLODipine  10 mg Oral Daily  . aspirin EC  81 mg Oral Daily  . calcium acetate  2,001 mg Oral TID WC  . Chlorhexidine Gluconate Cloth  6 each Topical Q0600  . cholecalciferol  5,000 Units Oral Once per day on Mon Wed Fri  . dexamethasone (DECADRON) injection  6 mg Intravenous Q24H  .  febuxostat  40 mg Oral Daily  . insulin aspart  0-5 Units  Subcutaneous QHS  . insulin aspart  0-6 Units Subcutaneous TID WC  . multivitamin with minerals  1 tablet Oral Daily  . pantoprazole  40 mg Oral QAC breakfast  . sodium bicarbonate  650 mg Oral BID  . thyroid  120 mg Oral Daily  . vitamin C  250 mg Oral Daily  . warfarin  5 mg Oral ONCE-1800  . Warfarin - Pharmacist Dosing Inpatient   Does not apply q1800   Continuous Infusions: . sodium chloride    . sodium chloride    . azithromycin 500 mg (05/20/19 1759)  . cefTRIAXone (ROCEPHIN)  IV Stopped (05/20/19 1638)  . remdesivir 100 mg in NS 100 mL 100 mg (05/21/19 0956)     LOS: 2 days    Time spent: 30 minutes    Cimberly Stoffel Darleen Crocker, DO [facility] Pager [phone]  If 7PM-7AM, please contact night-coverage [email or web address] Password [facility] 05/21/2019, 11:57 AM

## 2019-05-21 NOTE — Progress Notes (Signed)
ANTICOAGULATION CONSULT NOTE -   Pharmacy Consult for warfarin Indication: atrial fibrillation  Allergies  Allergen Reactions  . Insulin Glargine Swelling    "Makes me swell like a balloon all over", including face, but without any respiratory distress or rashes. Associated with weight gain.  . Statins Other (See Comments)    "I've tried them all; my muscle aches were so bad I couldn't walk".    Patient Measurements: Height: 5\' 4"  (162.6 cm) Weight: 179 lb 0.2 oz (81.2 kg) IBW/kg (Calculated) : 54.7  Vital Signs: Temp: 97.7 F (36.5 C) (12/24 0642) Temp Source: Oral (12/24 0642) BP: 129/42 (12/24 0642) Pulse Rate: 54 (12/24 0642)  Labs: Recent Labs    05/19/19 0930 05/19/19 0930 05/20/19 0450 05/20/19 0644 05/20/19 2019 05/21/19 0712  HGB 11.4*   < > 11.3*  --  11.1* 11.0*  HCT 36.7  --  36.2  --  37.5 35.2*  PLT 324  --  263  --  304 287  LABPROT 16.6*  --  16.3*  --   --  20.3*  INR 1.4*  --  1.3*  --   --  1.8*  CREATININE 11.81*  --   --  7.06* 7.88* 8.46*   < > = values in this interval not displayed.    Estimated Creatinine Clearance: 5.6 mL/min (A) (by C-G formula based on SCr of 8.46 mg/dL (H)).   Medical History: Past Medical History:  Diagnosis Date  . Anemia   . Arthritis   . Blood transfusion without reported diagnosis   . CAD (coronary artery disease)    a. 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  . Cataract   . Chronic anemia   . Chronic combined systolic and diastolic CHF (congestive heart failure) ([place])    a. 2D echo 08/2016 at [facility]: EF 50-55% with inferior wall HK, impaired LV filling, fair study.  . CKD (chronic kidney disease), stage III   . Diabetes ([place])   . Gastritis   . GERD (gastroesophageal reflux disease)   . Gout   . Headache   . HTN (hypertension)   . Hyperlipidemia   . Hypothyroidism   . Iron deficiency anemia 11/08/2016  . Normocytic anemia 10/26/2016  . NSTEMI (non-ST elevated myocardial  infarction) ([place])    a. Complex admission 08/2016 - with severe hyperglycemia, AKI on CKD, severe anemia down to Hgb 6.8, acute combined CHF, troponin of 8.5, cath deferred due to renal dysfunction.  . Thyroid disease   . Wears dentures     Medications:  Medications Prior to Admission  Medication Sig Dispense Refill Last Dose  . acetaminophen (TYLENOL) 325 MG tablet Take 2 tablets (650 mg total) by mouth every 6 (six) hours as needed for mild pain or headache.   Past Week at Unknown time  . amiodarone (PACERONE) 200 MG tablet Take 1 tablet (200 mg total) by mouth daily. 30 tablet 6 Past Week at Unknown time  . amLODipine (NORVASC) 10 MG tablet Take 1 tablet by mouth once daily (Patient taking differently: Take 10 mg by mouth daily. ) 90 tablet 1 Past Week at Unknown time  . Ascorbic Acid (VITAMIN C) 100 MG tablet Take 1 tablet (100 mg total) by mouth daily.   Past Week at Unknown time  . aspirin EC 81 MG EC tablet Take 1 tablet (81 mg total) by mouth daily.   Past Week at Unknown time  . calcium acetate (PHOSLO) 667 MG capsule Take 667 mg  by mouth See admin instructions. 3 capsules with each meal and 1 with snacks daily   Past Week at Unknown time  . Cholecalciferol (VITAMIN D-3) 125 MCG (5000 UT) TABS Take 5,000 mg by mouth 3 (three) times a week.    Past Week at Unknown time  . Evolocumab (REPATHA SURECLICK) 196 MG/ML SOAJ Inject 140 mg into the skin every 14 (fourteen) days. 2 pen 12 05/15/2019 at Unknown time  . febuxostat (ULORIC) 40 MG tablet Take 1 tablet by mouth once daily 90 tablet 0 Past Week at Unknown time  . gabapentin (NEURONTIN) 300 MG capsule TAKE 2 CAPSULES BY MOUTH ONCE DAILY AT NIGHT 60 capsule 3 Past Week at Unknown time  . metoprolol succinate (TOPROL XL) 25 MG 24 hr tablet Take 0.5 tablets (12.5 mg total) by mouth daily. 30 tablet 11 Past Week at Unknown time  . Multiple Vitamin (MULTIVITAMIN WITH MINERALS) TABS tablet Take 1 tablet by mouth daily.   Past Week at Unknown  time  . NP THYROID 120 MG tablet Take 1 tablet by mouth once daily 30 tablet 3 Past Week at Unknown time  . pantoprazole (PROTONIX) 40 MG tablet Take 1 tablet (40 mg total) by mouth 2 (two) times daily. 30 minutes before breakfast (Patient taking differently: Take 40 mg by mouth daily before breakfast. ) 180 tablet 3 Past Week at Unknown time  . warfarin (COUMADIN) 2.5 MG tablet Take 1 1/2 tablets daily or as directed (Patient taking differently: Take 2.5 mg by mouth See admin instructions. 5mg  every other day and 2.5mg  every other day alternating) 50 tablet 3 Past Week at Unknown time    Assessment: Pharmacy consulted to dose warfarin in patient with atrial fibrillation. Patient's INR on admission is 1.4. Home dose listed as 5 mg Tue-Thu-Sat and 7.5 mg ROW per anticoag clinic.  Per RN/patient, unsure of last dose but has not taken today 12/22.  INR 1.8, remains subtherapeutic but trending up.   Goal of Therapy:  INR 2-3 Monitor platelets by anticoagulation protocol: Yes   Plan:  Warfarin 5 mg x 1 dose  Daily PT-INR Monitor labs and s/s of bleeding.  Margot Ables, PharmD Clinical Pharmacist 05/21/2019 8:52 AM

## 2019-05-21 NOTE — Procedures (Signed)
   HEMODIALYSIS TREATMENT NOTE:  3 hour heparin-free treatment completed via right upper arm AVF (15g/antegrade).  Goal met: 2.2 liters removed with minimal interruption in UF for soft BP.  All blood was returned and hemostasis was achieved in 10 minutes.  Rockwell Alexandria, RN

## 2019-05-22 LAB — COMPREHENSIVE METABOLIC PANEL
ALT: 25 U/L (ref 0–44)
AST: 22 U/L (ref 15–41)
Albumin: 2.8 g/dL — ABNORMAL LOW (ref 3.5–5.0)
Alkaline Phosphatase: 63 U/L (ref 38–126)
Anion gap: 17 — ABNORMAL HIGH (ref 5–15)
BUN: 55 mg/dL — ABNORMAL HIGH (ref 8–23)
CO2: 25 mmol/L (ref 22–32)
Calcium: 7.9 mg/dL — ABNORMAL LOW (ref 8.9–10.3)
Chloride: 92 mmol/L — ABNORMAL LOW (ref 98–111)
Creatinine, Ser: 5.25 mg/dL — ABNORMAL HIGH (ref 0.44–1.00)
GFR calc Af Amer: 8 mL/min — ABNORMAL LOW (ref >60)
GFR calc non Af Amer: 7 mL/min — ABNORMAL LOW (ref >60)
Glucose, Bld: 164 mg/dL — ABNORMAL HIGH (ref 70–99)
Potassium: 3.8 mmol/L (ref 3.5–5.1)
Sodium: 134 mmol/L — ABNORMAL LOW (ref 135–145)
Total Bilirubin: 0.3 mg/dL (ref 0.3–1.2)
Total Protein: 5.9 g/dL — ABNORMAL LOW (ref 6.5–8.1)

## 2019-05-22 LAB — CBC
HCT: 35.7 % — ABNORMAL LOW (ref 36.0–46.0)
Hemoglobin: 11.2 g/dL — ABNORMAL LOW (ref 12.0–15.0)
MCH: 29 pg (ref 26.0–34.0)
MCHC: 31.4 g/dL (ref 30.0–36.0)
MCV: 92.5 fL (ref 80.0–100.0)
Platelets: 306 10*3/uL (ref 150–400)
RBC: 3.86 MIL/uL — ABNORMAL LOW (ref 3.87–5.11)
RDW: 15 % (ref 11.5–15.5)
WBC: 6.8 10*3/uL (ref 4.0–10.5)
nRBC: 0 % (ref 0.0–0.2)

## 2019-05-22 LAB — GLUCOSE, CAPILLARY
Glucose-Capillary: 153 mg/dL — ABNORMAL HIGH (ref 70–99)
Glucose-Capillary: 187 mg/dL — ABNORMAL HIGH (ref 70–99)
Glucose-Capillary: 313 mg/dL — ABNORMAL HIGH (ref 70–99)
Glucose-Capillary: 262 mg/dL — ABNORMAL HIGH (ref 70–99)

## 2019-05-22 LAB — PROTIME-INR
INR: 2.3 — ABNORMAL HIGH (ref 0.8–1.2)
Prothrombin Time: 24.8 seconds — ABNORMAL HIGH (ref 11.4–15.2)

## 2019-05-22 LAB — FERRITIN: Ferritin: 931 ng/mL — ABNORMAL HIGH (ref 11–307)

## 2019-05-22 LAB — C-REACTIVE PROTEIN: CRP: 11.1 mg/dL — ABNORMAL HIGH (ref <1.0)

## 2019-05-22 MED ORDER — GABAPENTIN 300 MG PO CAPS
300.0000 mg | ORAL_CAPSULE | Freq: Every day | ORAL | Status: AC
  Administered 2019-05-22: 300 mg via ORAL
  Filled 2019-05-22: qty 1

## 2019-05-22 MED ORDER — ORAL CARE MOUTH RINSE
15.0000 mL | Freq: Two times a day (BID) | OROMUCOSAL | Status: DC
  Administered 2019-05-23: 15 mL via OROMUCOSAL

## 2019-05-22 MED ORDER — WARFARIN SODIUM 5 MG PO TABS
5.0000 mg | ORAL_TABLET | Freq: Once | ORAL | Status: AC
  Administered 2019-05-22: 5 mg via ORAL

## 2019-05-22 NOTE — TOC Progression Note (Signed)
Transition of Care [facility]) - Progression Note    Patient Details  Name: [name] MRN: [id number] Date of Birth: Nov 12, 1940  [facility] CM/SW Contact  Shade Flood, LCSW Phone Number: 05/22/2019, 11:26 AM  Clinical Narrative:     Direction for travel to [facility] HD facility and appointment time/day information placed in dc envelope in rack with pt's hard chart. Secure chat sent to pt's RN to update and request that this information be passed on in report to follow up RNs. Anticipating dc home tomorrow after HD.  Weekend TOC will be available if needed.  Expected Discharge Plan: [place] Barriers to Discharge: Continued Medical Work up  Expected Discharge Plan and Services Expected Discharge Plan: [place] Determinants of Health (SDOH) Interventions    Readmission Risk Interventions Readmission Risk Prevention Plan 11/27/2018 11/11/2018  Transportation Screening Complete Complete  Social Work Consult for [place] Planning/Counseling - Complete  Palliative Care Screening - Not Applicable  Medication Review Press photographer) Complete -  [place] or Home Care Consult Complete -  SW Recovery Care/Counseling Consult Complete -  Palliative Care Screening Not Applicable -  [place] Not Applicable -  Some recent data might be hidden

## 2019-05-22 NOTE — Progress Notes (Signed)
PROGRESS NOTE    [name]  QVZ:[id number] DOB: 24-Jun-1940 DOA: 05/19/2019 PCP: Doree Albee, MD   Brief Narrative:  Per HPI: Briana Peplinski Croteauis a 78 y.o.femalewith medical history significant forCAD status post NSTEMI and CABG x4 (11/20/2018) with postop atrial fibrillation on Coumadin, diabetes, hypothyroidism, hypertension, gout, ESRD on HD MWF, and diastolic CHF with LVEF 32-95% along with pulmonary hypertension, who presented to the ED with 2-day history of altered mentation. She missed her last 2 hemodialysis sessions due to some persistent weakness and confusion. She has not been noted to be short of breath or have any chest pain, nausea, vomiting, or diarrhea. History is difficult to obtain on account of her confusion.  12/23:Patient was admitted with acute hypoxemic respiratory failure secondary to COVID-19 infection and was started on dexamethasone and remdesivir. She still remains on 3 L nasal cannula oxygen. She was also noted to have acute metabolic encephalopathy along with hyperkalemia in the setting of missed hemodialysis sessions which now appear to be improved. She has received hemodialysis on 12/22 with improvement in potassium levels.  12/24: Patient continues to remain on 3 L nasal cannula oxygen and is having dryness of her nasal mucosa.  Plans for hemodialysis today as well as repeat on 12/26.  12/25: Patient is currently on 2 L nasal cannula oxygen and is overall doing well.  She underwent hemodialysis on 12/24 with 2.2 L removed.  Labs are currently stable.  Plans to repeat hemodialysis on 12/26.  She will have her last day of remdesivir tomorrow.  Assessment & Plan:   Active Problems:   Acute hypoxemic respiratory failure due to COVID-19 [facility])   Acute hypoxemic respiratory failure secondary to COVID-19 infection -Associated weakness and confusion noted-improving -Continue to wean O2 as tolerated to room air. -Continue on dexamethasone and  remdesivir for 1 more day -Possible pneumonia with elevated procalcitonin and leukocytosis, therefore will start on azithromycin and Rocephin for now and monitor  -Chest x-ray with new groundglass opacities noted -Continue to monitor inflammatory markers -Wean oxygen as tolerated  Acute metabolic encephalopathy-resolved -Secondary to COVID-19 infection along with missed hemodialysis sessions -Improving this am  ESRD on HD -Appreciate nephrology recommendations with plans for hemodialysis 12/24 and repeat 12/26 -Plan for discharge after dialysis on 12/26 to new facility that can manage Covid patients  Hyperkalemia-resolved -Patient received hemodialysis and has received D50/insulin along with Lokelma -S/P HD on 12/22 -Monitor repeat in a.m. -Continue on telemetry -No significant EKG changes noted  Type 2 diabetes -With significant hyperglycemia currently -Monitor carefully while on steroids andA1c 6.1% -Maintain on SSI  Hypertension-controlled -Continue amlodipine and metoprolol  CAD/prior CABG with postoperative atrial fibrillation -Continue on Coumadin with pharmacy to assist with management -INR subtherapeutic -Continueamiodarone and hold metoprolol for now due to low HR  Hypothyroidism -Continue home medications andTSH 0.496  GERD -PPI   DVT prophylaxis:Coumadin Code Status:Full Family Communication:None at bedside Disposition Plan:Continue treatment for COVID-19 infection and monitor for hemodialysis. Continue to wean oxygen levels as tolerated.  Further hemodialysis planned 12/26.   Consultants:  Nephrology  Procedures:  Hemodialysis on 12/22  Antimicrobials:  Anti-infectives (From admission, onward)   Start     Dose/Rate Route Frequency Ordered Stop   05/20/19 1000  remdesivir 100 mg in sodium chloride 0.9 % 100 mL IVPB     100 mg 200 mL/hr over 30 Minutes Intravenous Daily 05/19/19 1351 05/24/19 0959   05/19/19 1500   remdesivir 200 mg in sodium chloride 0.9% 250 mL IVPB  200 mg 580 mL/hr over 30 Minutes Intravenous Once 05/19/19 1351 05/19/19 1549   05/19/19 1445  azithromycin (ZITHROMAX) 500 mg in sodium chloride 0.9 % 250 mL IVPB     500 mg 250 mL/hr over 60 Minutes Intravenous Every 24 hours 05/19/19 1436     05/19/19 1430  cefTRIAXone (ROCEPHIN) 1 g in sodium chloride 0.9 % 100 mL IVPB     1 g 200 mL/hr over 30 Minutes Intravenous Every 24 hours 05/19/19 1428     05/19/19 1430  azithromycin (ZITHROMAX) 250 mg in dextrose 5 % 125 mL IVPB  Status:  Discontinued     250 mg 125 mL/hr over 60 Minutes Intravenous Every 24 hours 05/19/19 1428 05/19/19 1436       Subjective: Patient seen and evaluated today with no new acute complaints or concerns. No acute concerns or events noted overnight.  She is overall feeling better and continues to remain on 2 L nasal cannula oxygen.  She had hemodialysis on 12/24.  Objective: Vitals:   05/22/19 0040 05/22/19 0600 05/22/19 0601 05/22/19 0843  BP: (!) 132/59  (!) 125/42 (!) 125/58  Pulse: 62  60 65  Resp:   16 16  Temp:   97.6 F (36.4 C) 97.6 F (36.4 C)  TempSrc:   Oral Oral  SpO2:   100% 100%  Weight:  78 kg    Height:        Intake/Output Summary (Last 24 hours) at 05/22/2019 1249 Last data filed at 05/22/2019 0030 Gross per 24 hour  Intake 830 ml  Output 2100 ml  Net -1270 ml   Filed Weights   05/20/19 1530 05/21/19 2105 05/22/19 0600  Weight: 81.2 kg 79.9 kg 78 kg    Examination:  General exam: Appears calm and comfortable  Respiratory system: Clear to auscultation. Respiratory effort normal.  Currently on 2 L nasal cannula oxygen. Cardiovascular system: S1 & S2 heard, RRR. No JVD, murmurs, rubs, gallops or clicks. No pedal edema. Gastrointestinal system: Abdomen is nondistended, soft and nontender. No organomegaly or masses felt. Normal bowel sounds heard. Central nervous system: Alert and oriented. No focal neurological deficits.  Extremities: Symmetric 5 x 5 power. Skin: No rashes, lesions or ulcers Psychiatry: Judgement and insight appear normal. Mood & affect appropriate.     Data Reviewed: I have personally reviewed following labs and imaging studies  CBC: Recent Labs  Lab 05/19/19 0930 05/20/19 0450 05/20/19 2019 05/21/19 0712 05/22/19 0735  WBC 14.9* 9.0 10.3 11.4* 6.8  NEUTROABS 13.2*  --   --   --   --   HGB 11.4* 11.3* 11.1* 11.0* 11.2*  HCT 36.7 36.2 37.5 35.2* 35.7*  MCV 97.1 94.5 98.2 93.6 92.5  PLT 324 263 304 287 275   Basic Metabolic Panel: Recent Labs  Lab 05/19/19 0930 05/20/19 0644 05/20/19 2019 05/21/19 0712 05/22/19 0735  NA 135 134* 133* 133* 134*  K 6.3* 4.0 4.1 4.3 3.8  CL 94* 90* 92* 91* 92*  CO2 20* 23 19* 21* 25  GLUCOSE 153* 206* 291* 221* 164*  BUN 90* 59* 83* 103* 55*  CREATININE 11.81* 7.06* 7.88* 8.46* 5.25*  CALCIUM 8.3* 8.5* 7.8* 7.6* 7.9*  MG  --  2.0  --   --   --   PHOS  --   --  6.0*  --   --    GFR: Estimated Creatinine Clearance: 8.8 mL/min (A) (by C-G formula based on SCr of 5.25 mg/dL (H)). Liver Function Tests: Recent  Labs  Lab 05/19/19 0930 05/20/19 0644 05/20/19 2019 05/21/19 0712 05/22/19 0735  AST 25 31  --  29 22  ALT 19 24  --  28 25  ALKPHOS 70 68  --  64 63  BILITOT 0.8 0.5  --  0.5 0.3  PROT 6.9 6.8  --  6.2* 5.9*  ALBUMIN 3.3* 3.1* 2.8* 2.8* 2.8*   No results for input(s): LIPASE, AMYLASE in the last 168 hours. Recent Labs  Lab 05/19/19 1051  AMMONIA 14   Coagulation Profile: Recent Labs  Lab 05/19/19 0930 05/20/19 0450 05/21/19 0712 05/22/19 0735  INR 1.4* 1.3* 1.8* 2.3*   Cardiac Enzymes: No results for input(s): CKTOTAL, CKMB, CKMBINDEX, TROPONINI in the last 168 hours. BNP (last 3 results) No results for input(s): PROBNP in the last 8760 hours. HbA1C: No results for input(s): HGBA1C in the last 72 hours. CBG: Recent Labs  Lab 05/21/19 1055 05/21/19 1600 05/21/19 2105 05/22/19 0826 05/22/19 1127   GLUCAP 236* 331* 330* 153* 187*   Lipid Profile: No results for input(s): CHOL, HDL, LDLCALC, TRIG, CHOLHDL, LDLDIRECT in the last 72 hours. Thyroid Function Tests: No results for input(s): TSH, T4TOTAL, FREET4, T3FREE, THYROIDAB in the last 72 hours. Anemia Panel: Recent Labs    05/21/19 0712 05/22/19 0735  FERRITIN 1,151* 931*   Sepsis Labs: Recent Labs  Lab 05/19/19 0930 05/19/19 1324 05/20/19 0450 05/21/19 0712  PROCALCITON 1.08  --  1.72 1.47  LATICACIDVEN 2.4* 2.7* 1.8  --     Recent Results (from the past 240 hour(s))  Urine culture     Status: None   Collection Time: 05/19/19  9:29 AM   Specimen: Urine, Catheterized  Result Value Ref Range Status   Specimen Description   Final    URINE, CATHETERIZED Performed at [facility], [address], [place], [place] [address]    Special Requests   Final    Normal Performed at [facility], [address] [place], [place] [address]    Culture   Final    NO GROWTH Performed at [facility], [place] [address] [place], [place] [address]    Report Status 05/21/2019 FINAL  Final  Blood Culture (routine x 2)     Status: None (Preliminary result)   Collection Time: 05/19/19 11:34 AM   Specimen: BLOOD LEFT ARM  Result Value Ref Range Status   Specimen Description BLOOD LEFT ARM  Final   Special Requests   Final    BOTTLES DRAWN AEROBIC AND ANAEROBIC Blood Culture adequate volume   Culture   Final    NO GROWTH 3 DAYS Performed at [facility], [address], [place], [place] [address]    Report Status PENDING  Incomplete  Blood Culture (routine x 2)     Status: None (Preliminary result)   Collection Time: 05/19/19  1:24 PM   Specimen: BLOOD LEFT HAND  Result Value Ref Range Status   Specimen Description BLOOD LEFT HAND  Final   Special Requests   Final    BOTTLES DRAWN AEROBIC AND ANAEROBIC Blood Culture adequate volume   Culture   Final    NO GROWTH 3 DAYS Performed at [facility], [address]  [place], [place] [address]    Report Status PENDING  Incomplete         Radiology Studies: No results found.      Scheduled Meds: . amiodarone  200 mg Oral Daily  . amLODipine  10 mg Oral Daily  .  aspirin EC  81 mg Oral Daily  . calcium acetate  2,001 mg Oral TID WC  . Chlorhexidine Gluconate Cloth  6 each Topical Q0600  . cholecalciferol  5,000 Units Oral Once per day on Mon Wed Fri  . dexamethasone (DECADRON) injection  6 mg Intravenous Q24H  . febuxostat  40 mg Oral Daily  . insulin aspart  0-5 Units Subcutaneous QHS  . insulin aspart  0-6 Units Subcutaneous TID WC  . multivitamin with minerals  1 tablet Oral Daily  . pantoprazole  40 mg Oral QAC breakfast  . polyethylene glycol  17 g Oral Daily  . sodium bicarbonate  650 mg Oral BID  . thyroid  120 mg Oral Daily  . vitamin C  250 mg Oral Daily  . warfarin  5 mg Oral Once  . Warfarin - Pharmacist Dosing Inpatient   Does not apply q1800   Continuous Infusions: . sodium chloride    . sodium chloride    . azithromycin 500 mg (05/21/19 1610)  . cefTRIAXone (ROCEPHIN)  IV 1 g (05/22/19 1121)  . remdesivir 100 mg in NS 100 mL 100 mg (05/22/19 0841)     LOS: 3 days    Time spent: 30 minutes    Jetaime Pinnix Darleen Crocker, DO [facility] Pager [phone]  If 7PM-7AM, please contact night-coverage [email or web address] Password [facility] 05/22/2019, 12:49 PM

## 2019-05-22 NOTE — Progress Notes (Addendum)
ANTICOAGULATION CONSULT NOTE -   Pharmacy Consult for warfarin Indication: atrial fibrillation  Allergies  Allergen Reactions  . Insulin Glargine Swelling    "Makes me swell like a balloon all over", including face, but without any respiratory distress or rashes. Associated with weight gain.  . Statins Other (See Comments)    "I've tried them all; my muscle aches were so bad I couldn't walk".    Patient Measurements: Height: 5\' 4"  (162.6 cm) Weight: 171 lb 15.3 oz (78 kg) IBW/kg (Calculated) : 54.7  Vital Signs: Temp: 97.6 F (36.4 C) (12/25 0843) Temp Source: Oral (12/25 0843) BP: 125/58 (12/25 0843) Pulse Rate: 65 (12/25 0843)  Labs: Recent Labs    05/20/19 0450 05/20/19 2019 05/21/19 0712 05/22/19 0735  HGB 11.3* 11.1* 11.0* 11.2*  HCT 36.2 37.5 35.2* 35.7*  PLT 263 304 287 306  LABPROT 16.3*  --  20.3* 24.8*  INR 1.3*  --  1.8* 2.3*  CREATININE  --  7.88* 8.46* 5.25*    Estimated Creatinine Clearance: 8.8 mL/min (A) (by C-G formula based on SCr of 5.25 mg/dL (H)).   Medical History: Past Medical History:  Diagnosis Date  . Anemia   . Arthritis   . Blood transfusion without reported diagnosis   . CAD (coronary artery disease)    a. 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  . Cataract   . Chronic anemia   . Chronic combined systolic and diastolic CHF (congestive heart failure) ([place])    a. 2D echo 08/2016 at [facility]: EF 50-55% with inferior wall HK, impaired LV filling, fair study.  . CKD (chronic kidney disease), stage III   . Diabetes ([place])   . Gastritis   . GERD (gastroesophageal reflux disease)   . Gout   . Headache   . HTN (hypertension)   . Hyperlipidemia   . Hypothyroidism   . Iron deficiency anemia 11/08/2016  . Normocytic anemia 10/26/2016  . NSTEMI (non-ST elevated myocardial infarction) ([place])    a. Complex admission 08/2016 - with severe hyperglycemia, AKI on CKD, severe anemia down to Hgb 6.8, acute combined CHF,  troponin of 8.5, cath deferred due to renal dysfunction.  . Thyroid disease   . Wears dentures     Medications:  Medications Prior to Admission  Medication Sig Dispense Refill Last Dose  . acetaminophen (TYLENOL) 325 MG tablet Take 2 tablets (650 mg total) by mouth every 6 (six) hours as needed for mild pain or headache.   Past Week at Unknown time  . amiodarone (PACERONE) 200 MG tablet Take 1 tablet (200 mg total) by mouth daily. 30 tablet 6 Past Week at Unknown time  . amLODipine (NORVASC) 10 MG tablet Take 1 tablet by mouth once daily (Patient taking differently: Take 10 mg by mouth daily. ) 90 tablet 1 Past Week at Unknown time  . Ascorbic Acid (VITAMIN C) 100 MG tablet Take 1 tablet (100 mg total) by mouth daily.   Past Week at Unknown time  . aspirin EC 81 MG EC tablet Take 1 tablet (81 mg total) by mouth daily.   Past Week at Unknown time  . calcium acetate (PHOSLO) 667 MG capsule Take 667 mg by mouth See admin instructions. 3 capsules with each meal and 1 with snacks daily   Past Week at Unknown time  . Cholecalciferol (VITAMIN D-3) 125 MCG (5000 UT) TABS Take 5,000 mg by mouth 3 (three) times a week.    Past Week at Unknown time  .  Evolocumab (REPATHA SURECLICK) 412 MG/ML SOAJ Inject 140 mg into the skin every 14 (fourteen) days. 2 pen 12 05/15/2019 at Unknown time  . febuxostat (ULORIC) 40 MG tablet Take 1 tablet by mouth once daily 90 tablet 0 Past Week at Unknown time  . gabapentin (NEURONTIN) 300 MG capsule TAKE 2 CAPSULES BY MOUTH ONCE DAILY AT NIGHT 60 capsule 3 Past Week at Unknown time  . metoprolol succinate (TOPROL XL) 25 MG 24 hr tablet Take 0.5 tablets (12.5 mg total) by mouth daily. 30 tablet 11 Past Week at Unknown time  . Multiple Vitamin (MULTIVITAMIN WITH MINERALS) TABS tablet Take 1 tablet by mouth daily.   Past Week at Unknown time  . NP THYROID 120 MG tablet Take 1 tablet by mouth once daily 30 tablet 3 Past Week at Unknown time  . pantoprazole (PROTONIX) 40 MG  tablet Take 1 tablet (40 mg total) by mouth 2 (two) times daily. 30 minutes before breakfast (Patient taking differently: Take 40 mg by mouth daily before breakfast. ) 180 tablet 3 Past Week at Unknown time  . warfarin (COUMADIN) 2.5 MG tablet Take 1 1/2 tablets daily or as directed (Patient taking differently: Take 2.5 mg by mouth See admin instructions. 5mg  every other day and 2.5mg  every other day alternating) 50 tablet 3 Past Week at Unknown time    Assessment: Pharmacy consulted to dose warfarin in patient with atrial fibrillation. Patient's INR on admission is 1.4. Home dose listed as 5 mg Tue-Thu-Sat and 7.5 mg ROW per anticoag clinic. Per patients husband he is giving 2.5mg  on T,TH, SAT And 5mg  ROW which is different from anticoag clinic.  Per RN/patient, unsure of last dose but has not taken today 12/22.  INR 2.3 therapeutic. Would suggest 5mg  daily  Goal of Therapy:  INR 2-3 Monitor platelets by anticoagulation protocol: Yes   Plan:  Warfarin 5 mg x 1 dose  Daily PT-INR Monitor labs and s/s of bleeding.  Isac Sarna, BS Pharm D, [place] Clinical Pharmacist Pager [phone] 05/22/2019 10:05 AM

## 2019-05-23 LAB — COMPREHENSIVE METABOLIC PANEL
ALT: 20 U/L (ref 0–44)
AST: 15 U/L (ref 15–41)
Albumin: 2.7 g/dL — ABNORMAL LOW (ref 3.5–5.0)
Alkaline Phosphatase: 59 U/L (ref 38–126)
Anion gap: 18 — ABNORMAL HIGH (ref 5–15)
BUN: 89 mg/dL — ABNORMAL HIGH (ref 8–23)
CO2: 24 mmol/L (ref 22–32)
Calcium: 8.2 mg/dL — ABNORMAL LOW (ref 8.9–10.3)
Chloride: 91 mmol/L — ABNORMAL LOW (ref 98–111)
Creatinine, Ser: 7.29 mg/dL — ABNORMAL HIGH (ref 0.44–1.00)
GFR calc Af Amer: 6 mL/min — ABNORMAL LOW (ref >60)
GFR calc non Af Amer: 5 mL/min — ABNORMAL LOW (ref >60)
Glucose, Bld: 211 mg/dL — ABNORMAL HIGH (ref 70–99)
Potassium: 4.4 mmol/L (ref 3.5–5.1)
Sodium: 133 mmol/L — ABNORMAL LOW (ref 135–145)
Total Bilirubin: 0.5 mg/dL (ref 0.3–1.2)
Total Protein: 5.7 g/dL — ABNORMAL LOW (ref 6.5–8.1)

## 2019-05-23 LAB — PROTIME-INR
INR: 2.8 — ABNORMAL HIGH (ref 0.8–1.2)
Prothrombin Time: 29.1 seconds — ABNORMAL HIGH (ref 11.4–15.2)

## 2019-05-23 LAB — CBC
HCT: 34.2 % — ABNORMAL LOW (ref 36.0–46.0)
Hemoglobin: 10.4 g/dL — ABNORMAL LOW (ref 12.0–15.0)
MCH: 28.7 pg (ref 26.0–34.0)
MCHC: 30.4 g/dL (ref 30.0–36.0)
MCV: 94.5 fL (ref 80.0–100.0)
Platelets: 284 10*3/uL (ref 150–400)
RBC: 3.62 MIL/uL — ABNORMAL LOW (ref 3.87–5.11)
RDW: 15.2 % (ref 11.5–15.5)
WBC: 5.8 10*3/uL (ref 4.0–10.5)
nRBC: 0 % (ref 0.0–0.2)

## 2019-05-23 LAB — GLUCOSE, CAPILLARY
Glucose-Capillary: 202 mg/dL — ABNORMAL HIGH (ref 70–99)
Glucose-Capillary: 263 mg/dL — ABNORMAL HIGH (ref 70–99)

## 2019-05-23 LAB — C-REACTIVE PROTEIN: CRP: 7.4 mg/dL — ABNORMAL HIGH (ref <1.0)

## 2019-05-23 LAB — FERRITIN: Ferritin: 813 ng/mL — ABNORMAL HIGH (ref 11–307)

## 2019-05-23 MED ORDER — DEXAMETHASONE 6 MG PO TABS: 6.0000 mg | ORAL_TABLET | Freq: Every day | ORAL | 0 refills | Status: AC

## 2019-05-23 MED ORDER — ALBUTEROL SULFATE HFA 108 (90 BASE) MCG/ACT IN AERS: 2.0000 | INHALATION_SPRAY | Freq: Four times a day (QID) | RESPIRATORY_TRACT | 0 refills | Status: DC | PRN

## 2019-05-23 MED ORDER — DEXAMETHASONE 6 MG PO TABS: 6.0000 mg | ORAL_TABLET | Freq: Every day | ORAL | 0 refills | Status: DC

## 2019-05-23 MED ORDER — GUAIFENESIN-DM 100-10 MG/5ML PO SYRP: 5.0000 mL | ORAL_SOLUTION | ORAL | 0 refills | Status: DC | PRN

## 2019-05-23 MED ORDER — WARFARIN SODIUM 5 MG PO TABS: 5.0000 mg | ORAL_TABLET | Freq: Once | ORAL | Status: DC

## 2019-05-23 NOTE — Discharge Summary (Signed)
Physician Discharge Summary  [name] QQI:[id number] DOB: [birth year] DOA: 05/19/2019  PCP: Doree Albee, MD  Admit date: 05/19/2019  Discharge date: 05/23/2019  Admitted From:Home  Disposition:  Home  Recommendations for Outpatient Follow-up:  1. Follow up with PCP in 1-2 weeks 2. Continue with dialysis on 12/27 as instructed at [facility] where she has been scheduled 3. Continue on dexamethasone for 6 more days to complete course of treatment, she has completed remdesivir without any oxygen needs on discharge 4. 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 She missed her last 2 hemodialysis sessions due to some persistent weakness and confusion. She has not been noted to be short of breath or have any chest pain, nausea, vomiting, or diarrhea. History is difficult to obtain on account of her confusion.  12/23:Patient was admitted with acute hypoxemic respiratory failure secondary to COVID-19 infection and was started on dexamethasone and remdesivir. She still remains on 3 L nasal cannula oxygen. She was also noted to have acute metabolic encephalopathy along with hyperkalemia in the setting of missed hemodialysis sessions which now appear to be improved. She has received hemodialysis on 12/22 with improvement in potassium levels.  12/24:Patient continues to remain on 3 L nasal cannula oxygen and is having dryness of her nasal  mucosa. Plans for hemodialysis today as well as repeat on 12/26.  12/25: Patient is currently on 2 L nasal cannula oxygen and is overall doing well.  She underwent hemodialysis on 12/24 with 2.2 L removed.  Labs are currently stable.  Plans to repeat hemodialysis on 12/26.  She will have her last day of remdesivir tomorrow.  12/26: Patient has been weaned to room air and does not require any further oxygen.  She has remained off oxygen for the last 24 hours.  No other acute issues or concerns otherwise noted.  Nephrology has evaluated patient with recommendations to hold off on hemodialysis today with plans for further hemodialysis outpatient on 12/27.  She has completed her full course of remdesivir and will need to remain on dexamethasone as prescribed for 6 more days.  Additionally, she has been given albuterol and Robitussin as needed for any symptoms.  Discharge Diagnoses:  Active Problems:   Acute hypoxemic respiratory failure due to COVID-19 [facility])  Principal discharge diagnosis: Acute hypoxemic respiratory failure secondary to COVID-19 infection.  Discharge Instructions  Discharge Instructions    Diet - low sodium heart healthy   Complete by: As directed    Increase activity slowly   Complete by: As directed      Allergies as of 05/23/2019      Reactions   Insulin Glargine Swelling   "Makes me swell like a balloon all over", including face, but without any respiratory distress or rashes. Associated with weight gain.   Statins Other (See Comments)   "I've tried them all; my muscle aches were so bad I couldn't walk".      Medication List    TAKE these medications   acetaminophen 325 MG tablet Commonly known as: TYLENOL Take  2 tablets (650 mg total) by mouth every 6 (six) hours as needed for mild pain or headache.   albuterol 108 (90 Base) MCG/ACT inhaler Commonly known as: VENTOLIN HFA Inhale 2 puffs into the lungs every 6 (six) hours as needed for wheezing or shortness  of breath.   amiodarone 200 MG tablet Commonly known as: PACERONE Take 1 tablet (200 mg total) by mouth daily.   amLODipine 10 MG tablet Commonly known as: NORVASC Take 1 tablet by mouth once daily   aspirin 81 MG EC tablet Take 1 tablet (81 mg total) by mouth daily.   dexamethasone 6 MG tablet Commonly known as: DECADRON Take 1 tablet (6 mg total) by mouth daily for 6 days.   febuxostat 40 MG tablet Commonly known as: ULORIC Take 1 tablet by mouth once daily   gabapentin 300 MG capsule Commonly known as: NEURONTIN TAKE 2 CAPSULES BY MOUTH ONCE DAILY AT NIGHT   guaiFENesin-dextromethorphan 100-10 MG/5ML syrup Commonly known as: ROBITUSSIN DM Take 5 mLs by mouth every 4 (four) hours as needed for cough.   metoprolol succinate 25 MG 24 hr tablet Commonly known as: Toprol XL Take 0.5 tablets (12.5 mg total) by mouth daily.   multivitamin with minerals Tabs tablet Take 1 tablet by mouth daily.   NP Thyroid 120 MG tablet Generic drug: thyroid Take 1 tablet by mouth once daily   pantoprazole 40 MG tablet Commonly known as: PROTONIX Take 1 tablet (40 mg total) by mouth 2 (two) times daily. 30 minutes before breakfast What changed:   when to take this  additional instructions   PhosLo 667 MG capsule Generic drug: calcium acetate Take 667 mg by mouth See admin instructions. 3 capsules with each meal and 1 with snacks daily   Repatha SureClick 097 MG/ML Soaj Generic drug: Evolocumab Inject 140 mg into the skin every 14 (fourteen) days.   vitamin C 100 MG tablet Take 1 tablet (100 mg total) by mouth daily.   Vitamin D-3 125 MCG (5000 UT) Tabs Take 5,000 mg by mouth 3 (three) times a week.   warfarin 2.5 MG tablet Commonly known as: Coumadin Take as directed. If you are unsure how to take this medication, talk to your nurse or doctor. Original instructions: Take 1 1/2 tablets daily or as directed What changed:   how much to take  how to take this  when to  take this  additional instructions      Follow-up Information    [facility]. Go on 05/24/2019.   Why: Please arrive at 11:15 for first treatment. Call their number when you arrive in the parking lot. They will come out to the car to get [place]. Contact information: [place]         Allergies  Allergen Reactions  . Insulin Glargine Swelling    "Makes me swell like a balloon all over", including face, but without any respiratory distress or rashes. Associated with weight gain.  . Statins Other (See Comments)    "I've tried them all; my muscle aches were so bad I couldn't walk".    Consultations:  Nephrology   Procedures/Studies: [facility] Chest Port 1 View  Result Date: 05/19/2019 CLINICAL DATA:  Fever with weakness. Missed dialysis for 4 days. History of diabetes. EXAM: PORTABLE CHEST 1 VIEW COMPARISON:  Radiographs 01/05/2019 and 12/22/2018. FINDINGS: 0940 hours. Stable cardiomegaly post CABG. The left pleural effusion has resolved. There is vascular congestion with new ill-defined interstitial and ground-glass  opacities throughout both lungs. No consolidation or pneumothorax. The bones appear unremarkable. Telemetry leads overlie the chest. IMPRESSION: New ill-defined interstitial and ground-glass opacities throughout both lungs consistent with pulmonary edema or atypical infection. Result from COVID-19 testing performed today has not yet been reported. No focal airspace disease. Electronically Signed   By: Richardean Sale M.D.   On: 05/19/2019 09:57     Discharge Exam: Vitals:   05/23/19 0945 05/23/19 1037  BP:    Pulse:    Resp:    Temp:    SpO2: 98% 100%   Vitals:   05/23/19 0733 05/23/19 0749 05/23/19 0945 05/23/19 1037  BP: (!) 149/55 (!) 149/55    Pulse:      Resp:      Temp:      TempSrc:      SpO2:   98% 100%  Weight:      Height:        General: Pt is alert, awake, not in acute distress Cardiovascular: RRR, S1/S2 +,  no rubs, no gallops Respiratory: CTA bilaterally, no wheezing, no rhonchi, currently on room air Abdominal: Soft, NT, ND, bowel sounds + Extremities: no edema, no cyanosis    The results of significant diagnostics from this hospitalization (including imaging, microbiology, ancillary and laboratory) are listed below for reference.     Microbiology: Recent Results (from the past 240 hour(s))  Urine culture     Status: None   Collection Time: 05/19/19  9:29 AM   Specimen: Urine, Catheterized  Result Value Ref Range Status   Specimen Description   Final    URINE, CATHETERIZED Performed at [facility], [address] [place], [place] [address]    Special Requests   Final    Normal Performed at [facility], [address], [place], [place] [address]    Culture   Final    NO GROWTH Performed at [facility], [place] [address], [place], [place] [address]    Report Status 05/21/2019 FINAL  Final  Blood Culture (routine x 2)     Status: None (Preliminary result)   Collection Time: 05/19/19 11:34 AM   Specimen: BLOOD LEFT ARM  Result Value Ref Range Status   Specimen Description BLOOD LEFT ARM  Final   Special Requests   Final    BOTTLES DRAWN AEROBIC AND ANAEROBIC Blood Culture adequate volume   Culture   Final    NO GROWTH 4 DAYS Performed at [facility], [address] [place], [place] [address]    Report Status PENDING  Incomplete  Blood Culture (routine x 2)     Status: None (Preliminary result)   Collection Time: 05/19/19  1:24 PM   Specimen: BLOOD LEFT HAND  Result Value Ref Range Status   Specimen Description BLOOD LEFT HAND  Final   Special Requests   Final    BOTTLES DRAWN AEROBIC AND ANAEROBIC Blood Culture adequate volume   Culture   Final    NO GROWTH 4 DAYS Performed at [facility], [address], [place],  [address]    Report Status PENDING  Incomplete     Labs: BNP (last 3 results) No results for input(s): BNP in the last 8760 hours. Basic  Metabolic Panel: Recent Labs  Lab 05/20/19 0644 05/20/19 2019 05/21/19 0712 05/22/19 0735 05/23/19 0750  NA 134* 133* 133* 134* 133*  K 4.0 4.1 4.3 3.8 4.4  CL 90* 92* 91* 92* 91*  CO2 23 19* 21* 25 24  GLUCOSE 206*  291* 221* 164* 211*  BUN 59* 83* 103* 55* 89*  CREATININE 7.06* 7.88* 8.46* 5.25* 7.29*  CALCIUM 8.5* 7.8* 7.6* 7.9* 8.2*  MG 2.0  --   --   --   --   PHOS  --  6.0*  --   --   --    Liver Function Tests: Recent Labs  Lab 05/19/19 0930 05/20/19 0644 05/20/19 2019 05/21/19 0712 05/22/19 0735 05/23/19 0750  AST 25 31  --  29 22 15   ALT 19 24  --  28 25 20   ALKPHOS 70 68  --  64 63 59  BILITOT 0.8 0.5  --  0.5 0.3 0.5  PROT 6.9 6.8  --  6.2* 5.9* 5.7*  ALBUMIN 3.3* 3.1* 2.8* 2.8* 2.8* 2.7*   No results for input(s): LIPASE, AMYLASE in the last 168 hours. Recent Labs  Lab 05/19/19 1051  AMMONIA 14   CBC: Recent Labs  Lab 05/19/19 0930 05/20/19 0450 05/20/19 2019 05/21/19 0712 05/22/19 0735 05/23/19 0750  WBC 14.9* 9.0 10.3 11.4* 6.8 5.8  NEUTROABS 13.2*  --   --   --   --   --   HGB 11.4* 11.3* 11.1* 11.0* 11.2* 10.4*  HCT 36.7 36.2 37.5 35.2* 35.7* 34.2*  MCV 97.1 94.5 98.2 93.6 92.5 94.5  PLT 324 263 304 287 306 284   Cardiac Enzymes: No results for input(s): CKTOTAL, CKMB, CKMBINDEX, TROPONINI in the last 168 hours. BNP: Invalid input(s): POCBNP CBG: Recent Labs  Lab 05/22/19 0826 05/22/19 1127 05/22/19 1606 05/22/19 2013 05/23/19 0731  GLUCAP 153* 187* 313* 262* 202*   D-Dimer No results for input(s): DDIMER in the last 72 hours. Hgb A1c No results for input(s): HGBA1C in the last 72 hours. Lipid Profile No results for input(s): CHOL, HDL, LDLCALC, TRIG, CHOLHDL, LDLDIRECT in the last 72 hours. Thyroid function studies No results for input(s): TSH, T4TOTAL, T3FREE, THYROIDAB in the last 72 hours.  Invalid input(s): FREET3 Anemia work up Recent Labs    05/22/19 0735 05/23/19 0750  FERRITIN 931* 813*   Urinalysis     Component Value Date/Time   COLORURINE AMBER (A) 05/19/2019 0919   APPEARANCEUR HAZY (A) 05/19/2019 0919   LABSPEC 1.016 05/19/2019 0919   PHURINE 5.0 05/19/2019 0919   GLUCOSEU NEGATIVE 05/19/2019 0919   HGBUR NEGATIVE 05/19/2019 0919   BILIRUBINUR NEGATIVE 05/19/2019 0919   KETONESUR NEGATIVE 05/19/2019 0919   PROTEINUR 100 (A) 05/19/2019 0919   UROBILINOGEN 0.2 02/08/2015 1615   NITRITE NEGATIVE 05/19/2019 0919   LEUKOCYTESUR NEGATIVE 05/19/2019 0919   Sepsis Labs Invalid input(s): PROCALCITONIN,  WBC,  LACTICIDVEN Microbiology Recent Results (from the past 240 hour(s))  Urine culture     Status: None   Collection Time: 05/19/19  9:29 AM   Specimen: Urine, Catheterized  Result Value Ref Range Status   Specimen Description   Final    URINE, CATHETERIZED Performed at [facility], [address], [place] [address]    Special Requests   Final    Normal Performed at [facility], [address], [place], [place] [address]    Culture   Final    NO GROWTH Performed at [facility], [place] [address], [place], [place] [address]    Report Status 05/21/2019 FINAL  Final  Blood Culture (routine x 2)     Status: None (Preliminary result)   Collection Time: 05/19/19 11:34 AM   Specimen: BLOOD LEFT ARM  Result Value Ref Range Status  Specimen Description BLOOD LEFT ARM  Final   Special Requests   Final    BOTTLES DRAWN AEROBIC AND ANAEROBIC Blood Culture adequate volume   Culture   Final    NO GROWTH 4 DAYS Performed at [facility], [address], [place], [place] [address]    Report Status PENDING  Incomplete  Blood Culture (routine x 2)     Status: None (Preliminary result)   Collection Time: 05/19/19  1:24 PM   Specimen: BLOOD LEFT HAND  Result Value Ref Range Status   Specimen Description BLOOD LEFT HAND  Final   Special Requests   Final    BOTTLES DRAWN AEROBIC AND ANAEROBIC Blood Culture adequate volume   Culture   Final    NO GROWTH 4 DAYS Performed  at [facility], [address] [place], [place] [address]    Report Status PENDING  Incomplete     Time coordinating discharge: 35 minutes  SIGNED:   Rodena Goldmann, DO [facility] 05/23/2019, 11:02 AM  If 7PM-7AM, please contact night-coverage [email or web address]

## 2019-05-23 NOTE — Progress Notes (Signed)
ANTICOAGULATION CONSULT NOTE -   Pharmacy Consult for warfarin Indication: atrial fibrillation  Allergies  Allergen Reactions  . Insulin Glargine Swelling    "Makes me swell like a balloon all over", including face, but without any respiratory distress or rashes. Associated with weight gain.  . Statins Other (See Comments)    "I've tried them all; my muscle aches were so bad I couldn't walk".    Patient Measurements: Height: 5\' 4"  (162.6 cm) Weight: 171 lb 1.2 oz (77.6 kg) IBW/kg (Calculated) : 54.7  Vital Signs: Temp: 97.7 F (36.5 C) (12/26 0600) Temp Source: Oral (12/26 0600) BP: 149/55 (12/26 0749) Pulse Rate: 58 (12/26 0600)  Labs: Recent Labs    05/20/19 2019 05/21/19 0712 05/22/19 0735  HGB 11.1* 11.0* 11.2*  HCT 37.5 35.2* 35.7*  PLT 304 287 306  LABPROT  --  20.3* 24.8*  INR  --  1.8* 2.3*  CREATININE 7.88* 8.46* 5.25*    Estimated Creatinine Clearance: 8.8 mL/min (A) (by C-G formula based on SCr of 5.25 mg/dL (H)).   Medical History: Past Medical History:  Diagnosis Date  . Anemia   . Arthritis   . Blood transfusion without reported diagnosis   . CAD (coronary artery disease)    a. 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  . Cataract   . Chronic anemia   . Chronic combined systolic and diastolic CHF (congestive heart failure) ([place])    a. 2D echo 08/2016 at [facility]: EF 50-55% with inferior wall HK, impaired LV filling, fair study.  . CKD (chronic kidney disease), stage III   . Diabetes ([place])   . Gastritis   . GERD (gastroesophageal reflux disease)   . Gout   . Headache   . HTN (hypertension)   . Hyperlipidemia   . Hypothyroidism   . Iron deficiency anemia 11/08/2016  . Normocytic anemia 10/26/2016  . NSTEMI (non-ST elevated myocardial infarction) ([place])    a. Complex admission 08/2016 - with severe hyperglycemia, AKI on CKD, severe anemia down to Hgb 6.8, acute combined CHF, troponin of 8.5, cath deferred due to renal  dysfunction.  . Thyroid disease   . Wears dentures     Medications:  Medications Prior to Admission  Medication Sig Dispense Refill Last Dose  . acetaminophen (TYLENOL) 325 MG tablet Take 2 tablets (650 mg total) by mouth every 6 (six) hours as needed for mild pain or headache.   Past Week at Unknown time  . amiodarone (PACERONE) 200 MG tablet Take 1 tablet (200 mg total) by mouth daily. 30 tablet 6 Past Week at Unknown time  . amLODipine (NORVASC) 10 MG tablet Take 1 tablet by mouth once daily (Patient taking differently: Take 10 mg by mouth daily. ) 90 tablet 1 Past Week at Unknown time  . Ascorbic Acid (VITAMIN C) 100 MG tablet Take 1 tablet (100 mg total) by mouth daily.   Past Week at Unknown time  . aspirin EC 81 MG EC tablet Take 1 tablet (81 mg total) by mouth daily.   Past Week at Unknown time  . calcium acetate (PHOSLO) 667 MG capsule Take 667 mg by mouth See admin instructions. 3 capsules with each meal and 1 with snacks daily   Past Week at Unknown time  . Cholecalciferol (VITAMIN D-3) 125 MCG (5000 UT) TABS Take 5,000 mg by mouth 3 (three) times a week.    Past Week at Unknown time  . Evolocumab (REPATHA SURECLICK) 099 MG/ML SOAJ Inject 140  mg into the skin every 14 (fourteen) days. 2 pen 12 05/15/2019 at Unknown time  . febuxostat (ULORIC) 40 MG tablet Take 1 tablet by mouth once daily 90 tablet 0 Past Week at Unknown time  . gabapentin (NEURONTIN) 300 MG capsule TAKE 2 CAPSULES BY MOUTH ONCE DAILY AT NIGHT 60 capsule 3 Past Week at Unknown time  . metoprolol succinate (TOPROL XL) 25 MG 24 hr tablet Take 0.5 tablets (12.5 mg total) by mouth daily. 30 tablet 11 Past Week at Unknown time  . Multiple Vitamin (MULTIVITAMIN WITH MINERALS) TABS tablet Take 1 tablet by mouth daily.   Past Week at Unknown time  . NP THYROID 120 MG tablet Take 1 tablet by mouth once daily 30 tablet 3 Past Week at Unknown time  . pantoprazole (PROTONIX) 40 MG tablet Take 1 tablet (40 mg total) by mouth 2  (two) times daily. 30 minutes before breakfast (Patient taking differently: Take 40 mg by mouth daily before breakfast. ) 180 tablet 3 Past Week at Unknown time  . warfarin (COUMADIN) 2.5 MG tablet Take 1 1/2 tablets daily or as directed (Patient taking differently: Take 2.5 mg by mouth See admin instructions. 5mg  every other day and 2.5mg  every other day alternating) 50 tablet 3 Past Week at Unknown time    Assessment: Pharmacy consulted to dose warfarin in patient with atrial fibrillation. Patient's INR on admission is 1.4. Home dose listed as 5 mg Tue-Thu-Sat and 7.5 mg ROW per anticoag clinic. Per patients husband he is giving 2.5mg  on T,TH, SAT And 5mg  ROW which is different from anticoag clinic.  Per RN/patient, unsure of last dose but has not taken today 12/22.  INR 2.3 therapeutic. Would suggest 5mg  daily  Goal of Therapy:  INR 2-3 Monitor platelets by anticoagulation protocol: Yes   Plan:  Warfarin 5 mg x 1 dose  Daily PT-INR Monitor labs and s/s of bleeding.  Thomasenia Sales, PharmD, MBA, BCGP Clinical Pharmacist  05/23/2019 7:58 AM

## 2019-05-23 NOTE — Progress Notes (Signed)
Ms. Spadaccini has markedly improved since admission and is set up for HD tomorrow at [facility] clinic.  Stable for discharge to home from renal standpoint and f/u with outpatient HD tomorrow.  Will sign off.  Please call with questions or concerns.

## 2019-05-23 NOTE — Discharge Instructions (Signed)
COVID-19 °COVID-19 is a respiratory infection that is caused by a virus called severe acute respiratory syndrome coronavirus 2 (SARS-CoV-2). The disease is also known as coronavirus disease or novel coronavirus. In some people, the virus may not cause any symptoms. In others, it may cause a serious infection. The infection can get worse quickly and can lead to complications, such as: °· Pneumonia, or infection of the lungs. °· Acute respiratory distress syndrome or ARDS. This is fluid build-up in the lungs. °· Acute respiratory failure. This is a condition in which there is not enough oxygen passing from the lungs to the body. °· Sepsis or septic shock. This is a serious bodily reaction to an infection. °· Blood clotting problems. °· Secondary infections due to bacteria or fungus. °The virus that causes COVID-19 is contagious. This means that it can spread from person to person through droplets from coughs and sneezes (respiratory secretions). °What are the causes? °This illness is caused by a virus. You may catch the virus by: °· Breathing in droplets from an infected person's cough or sneeze. °· Touching something, like a table or a doorknob, that was exposed to the virus (contaminated) and then touching your mouth, nose, or eyes. °What increases the risk? °Risk for infection °You are more likely to be infected with this virus if you: °· Live in or travel to an area with a COVID-19 outbreak. °· Come in contact with a sick person who recently traveled to an area with a COVID-19 outbreak. °· Provide care for or live with a person who is infected with COVID-19. °Risk for serious illness °You are more likely to become seriously ill from the virus if you: °· Are 65 years of age or older. °· Have a long-term disease that lowers your body's ability to fight infection (immunocompromised). °· Live in a nursing home or long-term care facility. °· Have a long-term (chronic) disease such as: °? Chronic lung disease, including  chronic obstructive pulmonary disease or asthma °? Heart disease. °? Diabetes. °? Chronic kidney disease. °? Liver disease. °· Are obese. °What are the signs or symptoms? °Symptoms of this condition can range from mild to severe. Symptoms may appear any time from 2 to 14 days after being exposed to the virus. They include: °· A fever. °· A cough. °· Difficulty breathing. °· Chills. °· Muscle pains. °· A sore throat. °· Loss of taste or smell. °Some people may also have stomach problems, such as nausea, vomiting, or diarrhea. °Other people may not have any symptoms of COVID-19. °How is this diagnosed? °This condition may be diagnosed based on: °· Your signs and symptoms, especially if: °? You live in an area with a COVID-19 outbreak. °? You recently traveled to or from an area where the virus is common. °? You provide care for or live with a person who was diagnosed with COVID-19. °· A physical exam. °· Lab tests, which may include: °? A nasal swab to take a sample of fluid from your nose. °? A throat swab to take a sample of fluid from your throat. °? A sample of mucus from your lungs (sputum). °? Blood tests. °· Imaging tests, which may include, X-rays, CT scan, or ultrasound. °How is this treated? °At present, there is no medicine to treat COVID-19. Medicines that treat other diseases are being used on a trial basis to see if they are effective against COVID-19. °Your health care provider will talk with you about ways to treat your symptoms. For most   people, the infection is mild and can be managed at home with rest, fluids, and over-the-counter medicines. °Treatment for a serious infection usually takes places in a hospital intensive care unit (ICU). It may include one or more of the following treatments. These treatments are given until your symptoms improve. °· Receiving fluids and medicines through an IV. °· Supplemental oxygen. Extra oxygen is given through a tube in the nose, a face mask, or a  hood. °· Positioning you to lie on your stomach (prone position). This makes it easier for oxygen to get into the lungs. °· Continuous positive airway pressure (CPAP) or bi-level positive airway pressure (BPAP) machine. This treatment uses mild air pressure to keep the airways open. A tube that is connected to a motor delivers oxygen to the body. °· Ventilator. This treatment moves air into and out of the lungs by using a tube that is placed in your windpipe. °· Tracheostomy. This is a procedure to create a hole in the neck so that a breathing tube can be inserted. °· Extracorporeal membrane oxygenation (ECMO). This procedure gives the lungs a chance to recover by taking over the functions of the heart and lungs. It supplies oxygen to the body and removes carbon dioxide. °Follow these instructions at home: °Lifestyle °· If you are sick, stay home except to get medical care. Your health care provider will tell you how long to stay home. Call your health care provider before you go for medical care. °· Rest at home as told by your health care provider. °· Do not use any products that contain nicotine or tobacco, such as cigarettes, e-cigarettes, and chewing tobacco. If you need help quitting, ask your health care provider. °· Return to your normal activities as told by your health care provider. Ask your health care provider what activities are safe for you. °General instructions °· Take over-the-counter and prescription medicines only as told by your health care provider. °· Drink enough fluid to keep your urine pale yellow. °· Keep all follow-up visits as told by your health care provider. This is important. °How is this prevented? ° °There is no vaccine to help prevent COVID-19 infection. However, there are steps you can take to protect yourself and others from this virus. °To protect yourself:  °· Do not travel to areas where COVID-19 is a risk. The areas where COVID-19 is reported change often. To identify  high-risk areas and travel restrictions, check the CDC travel website: wwwnc.cdc.gov/travel/notices °· If you live in, or must travel to, an area where COVID-19 is a risk, take precautions to avoid infection. °? Stay away from people who are sick. °? Wash your hands often with soap and water for 20 seconds. If soap and water are not available, use an alcohol-based hand sanitizer. °? Avoid touching your mouth, face, eyes, or nose. °? Avoid going out in public, follow guidance from your state and local health authorities. °? If you must go out in public, wear a cloth face covering or face mask. °? Disinfect objects and surfaces that are frequently touched every day. This may include: °§ Counters and tables. °§ Doorknobs and light switches. °§ Sinks and faucets. °§ Electronics, such as phones, remote controls, keyboards, computers, and tablets. °To protect others: °If you have symptoms of COVID-19, take steps to prevent the virus from spreading to others. °· If you think you have a COVID-19 infection, contact your health care provider right away. Tell your health care team that you think you may   have a COVID-19 infection. °· Stay home. Leave your house only to seek medical care. Do not use public transport. °· Do not travel while you are sick. °· Wash your hands often with soap and water for 20 seconds. If soap and water are not available, use alcohol-based hand sanitizer. °· Stay away from other members of your household. Let healthy household members care for children and pets, if possible. If you have to care for children or pets, wash your hands often and wear a mask. If possible, stay in your own room, separate from others. Use a different bathroom. °· Make sure that all people in your household wash their hands well and often. °· Cough or sneeze into a tissue or your sleeve or elbow. Do not cough or sneeze into your hand or into the air. °· Wear a cloth face covering or face mask. °Where to find more  information °· Centers for Disease Control and Prevention: www.cdc.gov/coronavirus/2019-ncov/index.html °· World Health Organization: www.who.int/health-topics/coronavirus °Contact a health care provider if: °· You live in or have traveled to an area where COVID-19 is a risk and you have symptoms of the infection. °· You have had contact with someone who has COVID-19 and you have symptoms of the infection. °Get help right away if: °· You have trouble breathing. °· You have pain or pressure in your chest. °· You have confusion. °· You have bluish lips and fingernails. °· You have difficulty waking from sleep. °· You have symptoms that get worse. °These symptoms may represent a serious problem that is an emergency. Do not wait to see if the symptoms will go away. Get medical help right away. Call your local emergency services (911 in the [place] Do not drive yourself to the hospital. Let the emergency medical personnel know if you think you have COVID-19. °Summary °· COVID-19 is a respiratory infection that is caused by a virus. It is also known as coronavirus disease or novel coronavirus. It can cause serious infections, such as pneumonia, acute respiratory distress syndrome, acute respiratory failure, or sepsis. °· The virus that causes COVID-19 is contagious. This means that it can spread from person to person through droplets from coughs and sneezes. °· You are more likely to develop a serious illness if you are 65 years of age or older, have a weak immunity, live in a nursing home, or have chronic disease. °· There is no medicine to treat COVID-19. Your health care provider will talk with you about ways to treat your symptoms. °· Take steps to protect yourself and others from infection. Wash your hands often and disinfect objects and surfaces that are frequently touched every day. Stay away from people who are sick and wear a mask if you are sick. °This information is not intended to replace advice given to you by  your health care provider. Make sure you discuss any questions you have with your health care provider. °Document Released: 06/19/2018 Document Revised: 10/09/2018 Document Reviewed: 06/19/2018 °Elsevier Patient Education © 2020 [employer] ° °COVID-19: How to Protect Yourself and Others °Know how it spreads °· There is currently no vaccine to prevent coronavirus disease 2019 (COVID-19). °· The best way to prevent illness is to avoid being exposed to this virus. °· The virus is thought to spread mainly from person-to-person. °? Between people who are in close contact with one another (within about 6 feet). °? Through respiratory droplets produced when an infected person coughs, sneezes or talks. °? These droplets can land in   the mouths or noses of people who are nearby or possibly be inhaled into the lungs. °? Some recent studies have suggested that COVID-19 may be spread by people who are not showing symptoms. °Everyone should °Clean your hands often °· Wash your hands often with soap and water for at least 20 seconds especially after you have been in a public place, or after blowing your nose, coughing, or sneezing. °· If soap and water are not readily available, use a hand sanitizer that contains at least 60% alcohol. Cover all surfaces of your hands and rub them together until they feel dry. °· Avoid touching your eyes, nose, and mouth with unwashed hands. °Avoid close contact °· Stay home if you are sick. °· Avoid close contact with people who are sick. °· Put distance between yourself and other people. °? Remember that some people without symptoms may be able to spread virus. °? This is especially important for people who are at higher risk of getting very sick.www.cdc.gov/coronavirus/2019-ncov/need-extra-precautions/people-at-higher-risk.html °Cover your mouth and nose with a cloth face cover when around others °· You could spread COVID-19 to others even if you do not feel sick. °· Everyone should wear a  cloth face cover when they have to go out in public, for example to the grocery store or to pick up other necessities. °? Cloth face coverings should not be placed on young children under age 2, anyone who has trouble breathing, or is unconscious, incapacitated or otherwise unable to remove the mask without assistance. °· The cloth face cover is meant to protect other people in case you are infected. °· Do NOT use a facemask meant for a healthcare worker. °· Continue to keep about 6 feet between yourself and others. The cloth face cover is not a substitute for social distancing. °Cover coughs and sneezes °· If you are in a private setting and do not have on your cloth face covering, remember to always cover your mouth and nose with a tissue when you cough or sneeze or use the inside of your elbow. °· Throw used tissues in the trash. °· Immediately wash your hands with soap and water for at least 20 seconds. If soap and water are not readily available, clean your hands with a hand sanitizer that contains at least 60% alcohol. °Clean and disinfect °· Clean AND disinfect frequently touched surfaces daily. This includes tables, doorknobs, light switches, countertops, handles, desks, phones, keyboards, toilets, faucets, and sinks. www.cdc.gov/coronavirus/2019-ncov/prevent-getting-sick/disinfecting-your-home.html °· If surfaces are dirty, clean them: Use detergent or soap and water prior to disinfection. °· Then, use a household disinfectant. You can see a list of EPA-registered household disinfectants here. °cdc.gov/coronavirus °09/30/2018 °This information is not intended to replace advice given to you by your health care provider. Make sure you discuss any questions you have with your health care provider. °Document Released: 09/09/2018 Document Revised: 10/08/2018 Document Reviewed: 09/09/2018 °Elsevier Patient Education © 2020 [employer] ° ° °COVID-19 Frequently Asked Questions °COVID-19 (coronavirus disease) is  an infection that is caused by a large family of viruses. Some viruses cause illness in people and others cause illness in animals like camels, cats, and bats. In some cases, the viruses that cause illness in animals can spread to humans. °Where did the coronavirus come from? °In December 2019, [place] told the World Health Organization (WHO) of several cases of lung disease (human respiratory illness). These cases were linked to an open seafood and livestock market in the city of [place]. The link to the seafood and   livestock market suggests that the virus may have spread from animals to humans. However, since that first outbreak in December, the virus has also been shown to spread from person to person. °What is the name of the disease and the virus? °Disease name °Early on, this disease was called novel coronavirus. This is because scientists determined that the disease was caused by a new (novel) respiratory virus. The World Health Organization (WHO) has now named the disease COVID-19, or coronavirus disease. °Virus name °The virus that causes the disease is called severe acute respiratory syndrome coronavirus 2 (SARS-CoV-2). °More information on disease and virus naming °World Health Organization (WHO): www.who.int/emergencies/diseases/novel-coronavirus-2019/technical-guidance/naming-the-coronavirus-disease-(covid-2019)-and-the-virus-that-causes-it °Who is at risk for complications from coronavirus disease? °Some people may be at higher risk for complications from coronavirus disease. This includes older adults and people who have chronic diseases, such as heart disease, diabetes, and lung disease. °If you are at higher risk for complications, take these extra precautions: °· Avoid close contact with people who are sick or have a fever or cough. Stay at least 3-6 ft (1-2 m) away from them, if possible. °· Wash your hands often with soap and water for at least 20 seconds. °· Avoid touching your face, mouth, nose, or  eyes. °· Keep supplies on hand at home, such as food, medicine, and cleaning supplies. °· Stay home as much as possible. °· Avoid social gatherings and travel. °How does coronavirus disease spread? °The virus that causes coronavirus disease spreads easily from person to person (is contagious). There are also cases of community-spread disease. This means the disease has spread to: °· People who have no known contact with other infected people. °· People who have not traveled to areas where there are known cases. °It appears to spread from one person to another through droplets from coughing or sneezing. °Can I get the virus from touching surfaces or objects? °There is still a lot that we do not know about the virus that causes coronavirus disease. Scientists are basing a lot of information on what they know about similar viruses, such as: °· Viruses cannot generally survive on surfaces for long. They need a human body (host) to survive. °· It is more likely that the virus is spread by close contact with people who are sick (direct contact), such as through: °? Shaking hands or hugging. °? Breathing in respiratory droplets that travel through the air. This can happen when an infected person coughs or sneezes on or near other people. °· It is less likely that the virus is spread when a person touches a surface or object that has the virus on it (indirect contact). The virus may be able to enter the body if the person touches a surface or object and then touches his or her face, eyes, nose, or mouth. °Can a person spread the virus without having symptoms of the disease? °It may be possible for the virus to spread before a person has symptoms of the disease, but this is most likely not the main way the virus is spreading. It is more likely for the virus to spread by being in close contact with people who are sick and breathing in the respiratory droplets of a sick person's cough or sneeze. °What are the symptoms of  coronavirus disease? °Symptoms vary from person to person and can range from mild to severe. Symptoms may include: °· Fever. °· Cough. °· Tiredness, weakness, or fatigue. °· Fast breathing or feeling short of breath. °These symptoms can appear anywhere from   2 to 14 days after you have been exposed to the virus. If you develop symptoms, call your health care provider. People with severe symptoms may need hospital care. °If I am exposed to the virus, how long does it take before symptoms start? °Symptoms of coronavirus disease may appear anywhere from 2 to 14 days after a person has been exposed to the virus. If you develop symptoms, call your health care provider. °Should I be tested for this virus? °Your health care provider will decide whether to test you based on your symptoms, history of exposure, and your risk factors. °How does a health care provider test for this virus? °Health care providers will collect samples to send for testing. Samples may include: °· Taking a swab of fluid from the nose. °· Taking fluid from the lungs by having you cough up mucus (sputum) into a sterile cup. °· Taking a blood sample. °· Taking a stool or urine sample. °Is there a treatment or vaccine for this virus? °Currently, there is no vaccine to prevent coronavirus disease. Also, there are no medicines like antibiotics or antivirals to treat the virus. A person who becomes sick is given supportive care, which means rest and fluids. A person may also relieve his or her symptoms by using over-the-counter medicines that treat sneezing, coughing, and runny nose. These are the same medicines that a person takes for the common cold. °If you develop symptoms, call your health care provider. People with severe symptoms may need hospital care. °What can I do to protect myself and my family from this virus? ° °  ° °You can protect yourself and your family by taking the same actions that you would take to prevent the spread of other viruses.  Take the following actions: °· Wash your hands often with soap and water for at least 20 seconds. If soap and water are not available, use alcohol-based hand sanitizer. °· Avoid touching your face, mouth, nose, or eyes. °· Cough or sneeze into a tissue, sleeve, or elbow. Do not cough or sneeze into your hand or the air. °? If you cough or sneeze into a tissue, throw it away immediately and wash your hands. °· Disinfect objects and surfaces that you frequently touch every day. °· Avoid close contact with people who are sick or have a fever or cough. Stay at least 3-6 ft (1-2 m) away from them, if possible. °· Stay home if you are sick, except to get medical care. Call your health care provider before you get medical care. °· Make sure your vaccines are up to date. Ask your health care provider what vaccines you need. °What should I do if I need to travel? °Follow travel recommendations from your local health authority, the CDC, and WHO. °Travel information and advice °· Centers for Disease Control and Prevention (CDC): www.cdc.gov/coronavirus/2019-ncov/travelers/index.html °· World Health Organization (WHO): www.who.int/emergencies/diseases/novel-coronavirus-2019/travel-advice °Know the risks and take action to protect your health °· You are at higher risk of getting coronavirus disease if you are traveling to areas with an outbreak or if you are exposed to travelers from areas with an outbreak. °· Wash your hands often and practice good hygiene to lower the risk of catching or spreading the virus. °What should I do if I am sick? °General instructions to stop the spread of infection °· Wash your hands often with soap and water for at least 20 seconds. If soap and water are not available, use alcohol-based hand sanitizer. °· Cough or sneeze into a   tissue, sleeve, or elbow. Do not cough or sneeze into your hand or the air. °· If you cough or sneeze into a tissue, throw it away immediately and wash your hands. °· Stay  home unless you must get medical care. Call your health care provider or local health authority before you get medical care. °· Avoid public areas. Do not take public transportation, if possible. °· If you can, wear a mask if you must go out of the house or if you are in close contact with someone who is not sick. °Keep your home clean °· Disinfect objects and surfaces that are frequently touched every day. This may include: °? Counters and tables. °? Doorknobs and light switches. °? Sinks and faucets. °? Electronics such as phones, remote controls, keyboards, computers, and tablets. °· Wash dishes in hot, soapy water or use a dishwasher. Air-dry your dishes. °· Wash laundry in hot water. °Prevent infecting other household members °· Let healthy household members care for children and pets, if possible. If you have to care for children or pets, wash your hands often and wear a mask. °· Sleep in a different bedroom or bed, if possible. °· Do not share personal items, such as razors, toothbrushes, deodorant, combs, brushes, towels, and washcloths. °Where to find more information °Centers for Disease Control and Prevention (CDC) °· Information and news updates: www.cdc.gov/coronavirus/2019-ncov °World Health Organization (WHO) °· Information and news updates: www.who.int/emergencies/diseases/novel-coronavirus-2019 °· Coronavirus health topic: www.who.int/health-topics/coronavirus °· Questions and answers on COVID-19: www.who.int/news-room/q-a-detail/q-a-coronaviruses °· Global tracker: who.sprinklr.com °American Academy of Pediatrics (AAP) °· Information for families: www.healthychildren.org/English/health-issues/conditions/chest-lungs/Pages/2019-Novel-Coronavirus.aspx °The coronavirus situation is changing rapidly. Check your local health authority website or the CDC and WHO websites for updates and news. °When should I contact a health care provider? °· Contact your health care provider if you have symptoms of an  infection, such as fever or cough, and you: °? Have been near anyone who is known to have coronavirus disease. °? Have come into contact with a person who is suspected to have coronavirus disease. °? Have traveled outside of the country. °When should I get emergency medical care? °· Get help right away by calling your local emergency services (911 in the [place] if you have: °? Trouble breathing. °? Pain or pressure in your chest. °? Confusion. °? Blue-tinged lips and fingernails. °? Difficulty waking from sleep. °? Symptoms that get worse. °Let the emergency medical personnel know if you think you have coronavirus disease. °Summary °· A new respiratory virus is spreading from person to person and causing COVID-19 (coronavirus disease). °· The virus that causes COVID-19 appears to spread easily. It spreads from one person to another through droplets from coughing or sneezing. °· Older adults and those with chronic diseases are at higher risk of disease. If you are at higher risk for complications, take extra precautions. °· There is currently no vaccine to prevent coronavirus disease. There are no medicines, such as antibiotics or antivirals, to treat the virus. °· You can protect yourself and your family by washing your hands often, avoiding touching your face, and covering your coughs and sneezes. °This information is not intended to replace advice given to you by your health care provider. Make sure you discuss any questions you have with your health care provider. °Document Released: 09/09/2018 Document Revised: 09/09/2018 Document Reviewed: 09/09/2018 °Elsevier Patient Education © 2020 Elsevier Inc. ° °

## 2019-05-23 NOTE — TOC Transition Note (Signed)
Transition of Care [facility]) - CM/SW Discharge Note   Patient Details  Name: [name] MRN: [id number] Date of Birth: 1940-10-23  Transition of Care [facility]) CM/SW Contact:  Marshell Garfinkel, RN Phone Number: 05/23/2019, 12:34 PM   Clinical Narrative:    Per RN patient does not have home health need or order. I spoke with friend [name] and he has tested negative for Covid 19. He will provide transportation to [facility] for hemodialysis. No other RNCM needs.    Final next level of care: Home/Self Care Barriers to Discharge: No Barriers Identified   Patient Goals and CMS Choice   CMS Medicare.gov Compare Post Acute Care list provided to:: Patient Represenative (must comment)(significant other [name] [phone])    Discharge Placement                       Discharge Plan and Services                                     Social Determinants of Health (SDOH) Interventions     Readmission Risk Interventions Readmission Risk Prevention Plan 11/27/2018 11/11/2018  Transportation Screening Complete Complete  Social Work Consult for [place] Planning/Counseling - Complete  Palliative Care Screening - Not Applicable  Medication Review Press photographer) Complete -  [place] or Home Care Consult Complete -  SW Recovery Care/Counseling Consult Complete -  Palliative Care Screening Not Applicable -  [place] Not Applicable -  Some recent data might be hidden

## 2019-05-23 NOTE — TOC Transition Note (Signed)
Transition of Care [facility]) - CM/SW Discharge Note   Patient Details  Name: [name] MRN: [id number] Date of Birth: Nov 05, 1940  Transition of Care [facility]) CM/SW Contact:  Shade Flood, LCSW Phone Number: 05/23/2019, 10:46 AM   Clinical Narrative:     Plan is for dc home after HD today. Spoke with pt's sig other by phone to discuss follow up HD care. Provided information on [place] [facility] and timing for treatment tomorrow. Sig Other appears comfortable with the plan and states he will be able to get her there.  Spoke with RN at [facility] to update. They are expecting pt tomorrow and request that dc clinical be faxed to them at [phone]. Will fax once available. There are no other TOC needs for dc.  Final next level of care: Home/Self Care Barriers to Discharge: Barriers Resolved   Patient Goals and CMS Choice        Discharge Placement                       Discharge Plan and Services                                     Social Determinants of Health (SDOH) Interventions     Readmission Risk Interventions Readmission Risk Prevention Plan 11/27/2018 11/11/2018  Transportation Screening Complete Complete  Social Work Consult for [place] Planning/Counseling - Complete  Palliative Care Screening - Not Applicable  Medication Review Press photographer) Complete -  [place] or Home Care Consult Complete -  SW Recovery Care/Counseling Consult Complete -  Palliative Care Screening Not Applicable -  [place] Not Applicable -  Some recent data might be hidden

## 2019-05-24 DIAGNOSIS — N186 End stage renal disease: Secondary | ICD-10-CM | POA: Diagnosis not present

## 2019-05-24 DIAGNOSIS — Z992 Dependence on renal dialysis: Secondary | ICD-10-CM | POA: Diagnosis not present

## 2019-05-24 LAB — CULTURE, BLOOD (ROUTINE X 2)
Culture: NO GROWTH
Culture: NO GROWTH
Special Requests: ADEQUATE
Special Requests: ADEQUATE

## 2019-05-25 ENCOUNTER — Other Ambulatory Visit: Payer: Self-pay

## 2019-05-25 ENCOUNTER — Telehealth: Payer: Self-pay | Admitting: Internal Medicine

## 2019-05-25 DIAGNOSIS — I5030 Unspecified diastolic (congestive) heart failure: Secondary | ICD-10-CM

## 2019-05-25 NOTE — Patient Outreach (Signed)
[facility]) Care Management  05/25/2019  KESHONNA VALVO 07-09-1940 [id number]     Transition of Care Referral  Referral Date: 05/25/2019 Referral Source: [facility] Liaison Date of Admission: 05/19/2019 Diagnosis: "acute resp. Failure, COVID-19" Date of Discharge: 05/23/2019 [place]: [facility]    Outreach attempt # 1 to patient. No answer at present. Voicemail has different name other than patient so no message left.    Plan: RN CM will will make outreach attempt to patient within 3-4 business days. RN CM will send unsuccessful outreach letter to patient.   Enzo Montgomery, RN,BSN,CCM [place] Management Telephonic Care Management Coordinator Direct Phone: [phone] Toll Free: [phone] Fax: [phone]

## 2019-05-25 NOTE — Telephone Encounter (Signed)
Left message to call back  

## 2019-05-25 NOTE — Telephone Encounter (Signed)
Per her last appt with Dr. Debara Pickett on 04/28/19, patient states that there was some talk of getting her on some new medications. At the time she was hesitant and wanted to think it over but she has made the decision to go forward with the medication. She does not remember the names of the medication he mentioned but would like to speak with someone in regards to getting these new prescriptions sent in.

## 2019-05-26 ENCOUNTER — Other Ambulatory Visit: Payer: Self-pay

## 2019-05-26 DIAGNOSIS — Z992 Dependence on renal dialysis: Secondary | ICD-10-CM | POA: Diagnosis not present

## 2019-05-26 DIAGNOSIS — N186 End stage renal disease: Secondary | ICD-10-CM | POA: Diagnosis not present

## 2019-05-26 NOTE — Telephone Encounter (Signed)
LMTCB  Per last MD note: PLAN: 1.   [name] has done well on Repatha with a significant improvement in her lipids however remains well above target LDL.  Additional therapy could bring those numbers down including Vascepa and Nexletol if they were tolerated.  She wishes to work on dietary approaches and consider these options but does not want to entertain additional therapy at this time.  Plan follow-up with me in 6 months.

## 2019-05-26 NOTE — Patient Outreach (Signed)
[place] [facility]) Care Management  05/26/2019  [name] Aug 22, 1940 [id number]   Transition of Care Referral  Referral Date: 05/25/2019 Referral Source: [facility] Liaison Date of Admission: 05/19/2019 Diagnosis: "acute resp. Failure, COVID-19" Date of Discharge: 05/23/2019 [place]: [facility]   Outreach attempt #2 to patient. No answer at present.     Plan: RN CM will make outreach attempt to patient within 3-4 business days.  Enzo Montgomery, RN,BSN,CCM [place] Management Telephonic Care Management Coordinator Direct Phone: [phone] Toll Free: [phone] Fax: [phone]

## 2019-05-27 ENCOUNTER — Other Ambulatory Visit: Payer: Self-pay

## 2019-05-27 NOTE — Patient Outreach (Signed)
[place] [facility]) Care Management  05/27/2019  AYMARA SASSI [birth year] [id number]   Transition of Care Referral  Referral Date:05/25/2019 Referral [facility] Liaison Date of Admission:05/19/2019 Diagnosis:"acute resp. Failure, COVID-19" Date of Discharge:05/23/2019 [place] Medicare     Outreach attempt #3 to patient and unable to reach patient.     Plan: RN CM will close case if no response from letter mailed to patient.   Enzo Montgomery, RN,BSN,CCM [place] Management Telephonic Care Management Coordinator Direct Phone: [phone] Toll Free: [phone] Fax: [phone]

## 2019-05-28 DIAGNOSIS — N186 End stage renal disease: Secondary | ICD-10-CM | POA: Diagnosis not present

## 2019-05-28 DIAGNOSIS — Z992 Dependence on renal dialysis: Secondary | ICD-10-CM | POA: Diagnosis not present

## 2019-05-28 NOTE — Telephone Encounter (Signed)
Ok . 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 Specifically, even Repatha was not studied for this. So, any lipid lowering therapy that we do may not ultimately lower her risk. I think we need to discuss this further in the office before adding anything else. For now, would continue Repatha.  DR H

## 2019-05-28 NOTE — Telephone Encounter (Signed)
My Chart message sent to patient with MD response.

## 2019-05-30 DIAGNOSIS — N186 End stage renal disease: Secondary | ICD-10-CM | POA: Diagnosis not present

## 2019-05-30 DIAGNOSIS — Z992 Dependence on renal dialysis: Secondary | ICD-10-CM | POA: Diagnosis not present

## 2019-05-30 DIAGNOSIS — Z23 Encounter for immunization: Secondary | ICD-10-CM | POA: Diagnosis not present

## 2019-06-01 ENCOUNTER — Telehealth: Payer: Self-pay | Admitting: Cardiology

## 2019-06-01 NOTE — Telephone Encounter (Signed)
Pt returned call.  No answer when called back again.  LMOM for pt to continue warfarin 7.5mg  daily except 5mg  on Tuesdays, Thursdays and Saturdays and INR appt made for 06/15/2019.  Pt tested Positive for COVID on 05/19/2019.

## 2019-06-01 NOTE — Telephone Encounter (Signed)
Called phone number provided.  No answer.  LMOM for pt to call back.

## 2019-06-01 NOTE — Telephone Encounter (Signed)
Patient recently had covid and has not been able to come into the office.  She would like to know what/how she needs to be taking blood thinner until she is able to come in

## 2019-06-02 DIAGNOSIS — Z992 Dependence on renal dialysis: Secondary | ICD-10-CM | POA: Diagnosis not present

## 2019-06-02 DIAGNOSIS — Z23 Encounter for immunization: Secondary | ICD-10-CM | POA: Diagnosis not present

## 2019-06-02 DIAGNOSIS — N186 End stage renal disease: Secondary | ICD-10-CM | POA: Diagnosis not present

## 2019-06-03 DIAGNOSIS — I4821 Permanent atrial fibrillation: Secondary | ICD-10-CM | POA: Diagnosis not present

## 2019-06-03 DIAGNOSIS — I10 Essential (primary) hypertension: Secondary | ICD-10-CM | POA: Diagnosis not present

## 2019-06-03 DIAGNOSIS — R2689 Other abnormalities of gait and mobility: Secondary | ICD-10-CM | POA: Diagnosis not present

## 2019-06-03 DIAGNOSIS — N186 End stage renal disease: Secondary | ICD-10-CM | POA: Diagnosis not present

## 2019-06-04 DIAGNOSIS — Z992 Dependence on renal dialysis: Secondary | ICD-10-CM | POA: Diagnosis not present

## 2019-06-04 DIAGNOSIS — N186 End stage renal disease: Secondary | ICD-10-CM | POA: Diagnosis not present

## 2019-06-04 DIAGNOSIS — Z23 Encounter for immunization: Secondary | ICD-10-CM | POA: Diagnosis not present

## 2019-06-05 ENCOUNTER — Other Ambulatory Visit: Payer: Self-pay

## 2019-06-05 NOTE — Patient Outreach (Signed)
[place] [facility]) Care Management  06/05/2019  [name] [birth year] [id number]   Transition of Care Referral  Referral Date:05/25/2019 Referral [facility] Liaison Date of Admission:05/19/2019 Diagnosis:"acute resp. Failure, COVID-19" Date of Discharge:05/23/2019 [place] Medicare   Multiple attempts to establish contact with patient without success. No response from letter mailed to patient. Case is being closed at this time.    Plan: RN CM will close case at this time.   Enzo Montgomery, RN,BSN,CCM [place] Management Telephonic Care Management Coordinator Direct Phone: [phone] Toll Free: [phone] Fax: [phone]

## 2019-06-06 DIAGNOSIS — Z23 Encounter for immunization: Secondary | ICD-10-CM | POA: Diagnosis not present

## 2019-06-06 DIAGNOSIS — Z992 Dependence on renal dialysis: Secondary | ICD-10-CM | POA: Diagnosis not present

## 2019-06-06 DIAGNOSIS — N186 End stage renal disease: Secondary | ICD-10-CM | POA: Diagnosis not present

## 2019-06-08 ENCOUNTER — Telehealth (INDEPENDENT_AMBULATORY_CARE_PROVIDER_SITE_OTHER): Payer: Self-pay

## 2019-06-08 DIAGNOSIS — Z992 Dependence on renal dialysis: Secondary | ICD-10-CM | POA: Diagnosis not present

## 2019-06-08 DIAGNOSIS — Z23 Encounter for immunization: Secondary | ICD-10-CM | POA: Diagnosis not present

## 2019-06-08 DIAGNOSIS — N186 End stage renal disease: Secondary | ICD-10-CM | POA: Diagnosis not present

## 2019-06-08 NOTE — Telephone Encounter (Signed)
Son called wanting to see if you will be willing to sign off for his mom to be put in a SNF/Rehab center for now. To help get her back on her feet and cared for. He spouse is revocering from Covid and he cannot care for her.

## 2019-06-08 NOTE — Telephone Encounter (Signed)
Schedule an appointment with Judson Roch next week so we can start paperwork

## 2019-06-09 ENCOUNTER — Ambulatory Visit (INDEPENDENT_AMBULATORY_CARE_PROVIDER_SITE_OTHER): Payer: Medicare HMO | Admitting: Internal Medicine

## 2019-06-09 ENCOUNTER — Telehealth (INDEPENDENT_AMBULATORY_CARE_PROVIDER_SITE_OTHER): Payer: Self-pay

## 2019-06-09 ENCOUNTER — Encounter (INDEPENDENT_AMBULATORY_CARE_PROVIDER_SITE_OTHER): Payer: Self-pay | Admitting: Internal Medicine

## 2019-06-09 VITALS — BP 130/77 | HR 79 | Temp 97.0°F | Ht 64.0 in | Wt 171.0 lb

## 2019-06-09 DIAGNOSIS — I4821 Permanent atrial fibrillation: Secondary | ICD-10-CM

## 2019-06-09 DIAGNOSIS — M1000 Idiopathic gout, unspecified site: Secondary | ICD-10-CM | POA: Diagnosis not present

## 2019-06-09 DIAGNOSIS — R2689 Other abnormalities of gait and mobility: Secondary | ICD-10-CM

## 2019-06-09 DIAGNOSIS — E118 Type 2 diabetes mellitus with unspecified complications: Secondary | ICD-10-CM

## 2019-06-09 DIAGNOSIS — Z992 Dependence on renal dialysis: Secondary | ICD-10-CM

## 2019-06-09 DIAGNOSIS — N186 End stage renal disease: Secondary | ICD-10-CM

## 2019-06-09 NOTE — Telephone Encounter (Signed)
Pt has to be seen in office to get setup for SNF/REhab.  Only ones taking COVID patients is [facility].

## 2019-06-09 NOTE — Progress Notes (Signed)
Metrics: Intervention Frequency ACO  Documented Smoking Status Yearly  Screened one or more times in 24 months  Cessation Counseling or  Active cessation medication Past 24 months  Past 24 months   Guideline developer: UpToDate (See UpToDate for funding source) Date Released: 2014       Wellness Office Visit  Subjective:  Patient ID: [name], female    DOB: [birth year]  Age: 79 y.o. MRN: [id number]  CC: This patient is here for a face-to-face evaluation because it is felt that she is not able to be cared for at home any longer. HPI She was admitted to the hospital on May 19, 2019 and discharged 4 days later on May 23, 2019 to home.  She had presented to the hospital with altered mental status.  She has end-stage renal disease and is on hemodialysis.  She had missed 2 hemodialysis sessions due to persistent weakness and confusion.  She was admitted with hypoxic respiratory failure secondary to COVID-19 infection and was started on dexamethasone and remdesivir.  She has made a good improvement medically at home but is extremely weak and requires a walker to walk around and requires help with most activities of daily living including feeding, dressing and bathing.  Her longstanding partner is really unable to look after her any longer.  Her son, who lives in [place], had come to visit but he has left today to go back to [place].  Thankfully, she does not require any supplemental oxygen and is saturating acceptably on room air now.  Past Medical History:  Diagnosis Date  . Anemia   . Arthritis   . Blood transfusion without reported diagnosis   . CAD (coronary artery disease)    a. 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  . Cataract   . Chronic anemia   . Chronic combined systolic and diastolic CHF (congestive heart failure) ([place])    a. 2D echo 08/2016 at [facility]: EF 50-55% with inferior wall HK, impaired LV filling, fair study.  . CKD (chronic kidney  disease), stage III   . Diabetes ([place])   . Gastritis   . GERD (gastroesophageal reflux disease)   . Gout   . Headache   . HTN (hypertension)   . Hyperlipidemia   . Hypothyroidism   . Iron deficiency anemia 11/08/2016  . Normocytic anemia 10/26/2016  . NSTEMI (non-ST elevated myocardial infarction) ([place])    a. Complex admission 08/2016 - with severe hyperglycemia, AKI on CKD, severe anemia down to Hgb 6.8, acute combined CHF, troponin of 8.5, cath deferred due to renal dysfunction.  . Thyroid disease   . Wears dentures       Family History  Problem Relation Age of Onset  . Diabetes Mother   . Asthma Mother   . Early death Mother 36       Pneumonia  . Early death Father        Killed at [place]  . CAD Neg Hx   . GI Bleed Neg Hx     Social History   Social History Narrative   Lives with significant other/partner [name]   He is her caregiver   Moved from [facility]      Her Son Leighanna Kirn (lives In [place]) -- ph # [phone] ( would like to be called about care issues)   Social History   Tobacco Use  . Smoking status: Never Smoker  . Smokeless tobacco: Never Used  Substance Use Topics  . Alcohol use:  No    Alcohol/week: 0.0 standard drinks    Current Meds  Medication Sig  . acetaminophen (TYLENOL) 325 MG tablet Take 2 tablets (650 mg total) by mouth every 6 (six) hours as needed for mild pain or headache.  . albuterol (VENTOLIN HFA) 108 (90 Base) MCG/ACT inhaler Inhale 2 puffs into the lungs every 6 (six) hours as needed for wheezing or shortness of breath.  Marland Kitchen amiodarone (PACERONE) 200 MG tablet Take 1 tablet (200 mg total) by mouth daily.  Marland Kitchen amLODipine (NORVASC) 10 MG tablet Take 1 tablet by mouth once daily (Patient taking differently: Take 10 mg by mouth daily. )  . Ascorbic Acid (VITAMIN C) 100 MG tablet Take 1 tablet (100 mg total) by mouth daily.  Marland Kitchen aspirin EC 81 MG EC tablet Take 1 tablet (81 mg total) by mouth daily.  . calcium acetate (PHOSLO) 667 MG capsule Take  667 mg by mouth See admin instructions. 3 capsules with each meal and 1 with snacks daily  . Cholecalciferol (VITAMIN D-3) 125 MCG (5000 UT) TABS Take 5,000 mg by mouth 3 (three) times a week.   . Evolocumab (REPATHA SURECLICK) 366 MG/ML SOAJ Inject 140 mg into the skin every 14 (fourteen) days.  . febuxostat (ULORIC) 40 MG tablet Take 1 tablet by mouth once daily  . metoprolol succinate (TOPROL XL) 25 MG 24 hr tablet Take 0.5 tablets (12.5 mg total) by mouth daily.  . Multiple Vitamin (MULTIVITAMIN WITH MINERALS) TABS tablet Take 1 tablet by mouth daily.  . pantoprazole (PROTONIX) 40 MG tablet Take 1 tablet (40 mg total) by mouth 2 (two) times daily. 30 minutes before breakfast (Patient taking differently: Take 40 mg by mouth daily before breakfast. )  . warfarin (COUMADIN) 2.5 MG tablet Take 1 1/2 tablets daily or as directed (Patient taking differently: Take 2.5 mg by mouth See admin instructions. 5mg  every other day and 2.5mg  every other day alternating)  . [DISCONTINUED] guaiFENesin-dextromethorphan (ROBITUSSIN DM) 100-10 MG/5ML syrup Take 5 mLs by mouth every 4 (four) hours as needed for cough.       Objective:   Today's Vitals: BP 130/77 (BP Location: Left Arm, Patient Position: Sitting, Cuff Size: Normal)   Pulse 79   Temp (!) 97 F (36.1 C) (Temporal)   Ht 5\' 4"  (1.626 m)   Wt 171 lb (77.6 kg)   SpO2 97%   BMI 29.35 kg/m  Vitals with BMI 06/09/2019 05/23/2019 05/23/2019  Height 5\' 4"  - -  Weight 171 lbs - -  BMI 44.03 - -  Systolic 474 259 563  Diastolic 77 55 55  Pulse 79 - -     Physical Exam  She looks chronically sick but her oxygen saturation is 97% on room air.  Lung fields are entirely clear.  She does appear to be alert and orientated at the present time.     Assessment   1. Functional gait abnormality   2. Permanent atrial fibrillation ([place])   3. ESRD (end stage renal disease) on dialysis ([place])   4. Controlled type 2 diabetes mellitus with complication,  without long-term current use of insulin ([place])   5. Idiopathic gout, unspecified chronicity, unspecified site       Tests ordered No orders of the defined types were placed in this encounter.    Plan: 1. She will need ongoing hemodialysis 3 times a week. 2. She will continue all above medications. 3. I have filled out [place] 2 form in the hopes that she may  be placed in a skilled nursing facility for rehabilitation.   No orders of the defined types were placed in this encounter.   Doree Albee, MD  TIME SPENT:30

## 2019-06-10 ENCOUNTER — Telehealth (INDEPENDENT_AMBULATORY_CARE_PROVIDER_SITE_OTHER): Payer: Self-pay

## 2019-06-10 DIAGNOSIS — N186 End stage renal disease: Secondary | ICD-10-CM | POA: Diagnosis not present

## 2019-06-10 DIAGNOSIS — Z992 Dependence on renal dialysis: Secondary | ICD-10-CM | POA: Diagnosis not present

## 2019-06-10 DIAGNOSIS — Z23 Encounter for immunization: Secondary | ICD-10-CM | POA: Diagnosis not present

## 2019-06-10 NOTE — Telephone Encounter (Signed)
Talked with SNF [facility] will not accept any Non active COVID patients.  Contacted [employer] Social services, Betsey Amen to help leasion the process to help spouse and son out of town. She will contact if she need any other information and chart.

## 2019-06-11 NOTE — Telephone Encounter (Signed)
Contacted Son [place]. Notified that was getting help for Social worker from [employer]. To help with transitional care into the a snf for rehab and therapy for Post Covid ,failure to thrive.. The spouse cannot care for her at this time. He is in recovery from [place] as well.  Betsey Amen, SW will be processing information for [facility] to help . She will keep [place] on status of care and placement.

## 2019-06-12 DIAGNOSIS — Z23 Encounter for immunization: Secondary | ICD-10-CM | POA: Diagnosis not present

## 2019-06-12 DIAGNOSIS — N186 End stage renal disease: Secondary | ICD-10-CM | POA: Diagnosis not present

## 2019-06-12 DIAGNOSIS — Z992 Dependence on renal dialysis: Secondary | ICD-10-CM | POA: Diagnosis not present

## 2019-06-15 DIAGNOSIS — N186 End stage renal disease: Secondary | ICD-10-CM | POA: Diagnosis not present

## 2019-06-15 DIAGNOSIS — Z992 Dependence on renal dialysis: Secondary | ICD-10-CM | POA: Diagnosis not present

## 2019-06-15 DIAGNOSIS — Z23 Encounter for immunization: Secondary | ICD-10-CM | POA: Diagnosis not present

## 2019-06-16 ENCOUNTER — Other Ambulatory Visit (INDEPENDENT_AMBULATORY_CARE_PROVIDER_SITE_OTHER): Payer: Self-pay | Admitting: Internal Medicine

## 2019-06-16 DIAGNOSIS — E039 Hypothyroidism, unspecified: Secondary | ICD-10-CM

## 2019-06-17 ENCOUNTER — Ambulatory Visit (HOSPITAL_COMMUNITY)
Admission: RE | Admit: 2019-06-17 | Discharge: 2019-06-17 | Disposition: A | Discharge status: Home / Self Care | Payer: Medicare HMO | Source: Ambulatory Visit | Attending: Nephrology | Admitting: Nephrology

## 2019-06-17 ENCOUNTER — Other Ambulatory Visit (HOSPITAL_COMMUNITY): Payer: Self-pay | Admitting: Nephrology

## 2019-06-17 ENCOUNTER — Other Ambulatory Visit: Payer: Self-pay

## 2019-06-17 DIAGNOSIS — M79671 Pain in right foot: Secondary | ICD-10-CM | POA: Insufficient documentation

## 2019-06-17 DIAGNOSIS — N186 End stage renal disease: Secondary | ICD-10-CM | POA: Diagnosis not present

## 2019-06-17 DIAGNOSIS — M79604 Pain in right leg: Secondary | ICD-10-CM | POA: Diagnosis not present

## 2019-06-17 DIAGNOSIS — Z992 Dependence on renal dialysis: Secondary | ICD-10-CM | POA: Diagnosis not present

## 2019-06-17 DIAGNOSIS — M7989 Other specified soft tissue disorders: Secondary | ICD-10-CM | POA: Insufficient documentation

## 2019-06-17 DIAGNOSIS — Z23 Encounter for immunization: Secondary | ICD-10-CM | POA: Diagnosis not present

## 2019-06-18 ENCOUNTER — Encounter (INDEPENDENT_AMBULATORY_CARE_PROVIDER_SITE_OTHER): Payer: Self-pay | Admitting: Nurse Practitioner

## 2019-06-18 ENCOUNTER — Telehealth (INDEPENDENT_AMBULATORY_CARE_PROVIDER_SITE_OTHER): Payer: Self-pay

## 2019-06-18 ENCOUNTER — Other Ambulatory Visit (INDEPENDENT_AMBULATORY_CARE_PROVIDER_SITE_OTHER): Payer: Self-pay

## 2019-06-18 ENCOUNTER — Ambulatory Visit (INDEPENDENT_AMBULATORY_CARE_PROVIDER_SITE_OTHER): Payer: Medicare HMO | Admitting: Nurse Practitioner

## 2019-06-18 VITALS — BP 146/62 | HR 78 | Temp 97.4°F | Resp 14 | Ht 64.0 in | Wt 172.0 lb

## 2019-06-18 DIAGNOSIS — Z794 Long term (current) use of insulin: Secondary | ICD-10-CM

## 2019-06-18 DIAGNOSIS — E11621 Type 2 diabetes mellitus with foot ulcer: Secondary | ICD-10-CM | POA: Insufficient documentation

## 2019-06-18 DIAGNOSIS — L97511 Non-pressure chronic ulcer of other part of right foot limited to breakdown of skin: Secondary | ICD-10-CM | POA: Diagnosis not present

## 2019-06-18 DIAGNOSIS — E118 Type 2 diabetes mellitus with unspecified complications: Secondary | ICD-10-CM

## 2019-06-18 DIAGNOSIS — L97519 Non-pressure chronic ulcer of other part of right foot with unspecified severity: Secondary | ICD-10-CM

## 2019-06-18 DIAGNOSIS — E08621 Diabetes mellitus due to underlying condition with foot ulcer: Secondary | ICD-10-CM | POA: Diagnosis not present

## 2019-06-18 LAB — CBC WITH DIFFERENTIAL/PLATELET
Absolute Monocytes: 703 cells/uL (ref 200–950)
Basophils Absolute: 24 cells/uL (ref 0–200)
Basophils Relative: 0.3 %
Eosinophils Absolute: 142 cells/uL (ref 15–500)
Eosinophils Relative: 1.8 %
HCT: 28.9 % — ABNORMAL LOW (ref 35.0–45.0)
Hemoglobin: 9 g/dL — ABNORMAL LOW (ref 11.7–15.5)
Lymphs Abs: 1201 cells/uL (ref 850–3900)
MCH: 28 pg (ref 27.0–33.0)
MCHC: 31.1 g/dL — ABNORMAL LOW (ref 32.0–36.0)
MCV: 90 fL (ref 80.0–100.0)
MPV: 9.9 fL (ref 7.5–12.5)
Monocytes Relative: 8.9 %
Neutro Abs: 5830 cells/uL (ref 1500–7800)
Neutrophils Relative %: 73.8 %
Platelets: 421 10*3/uL — ABNORMAL HIGH (ref 140–400)
RBC: 3.21 10*6/uL — ABNORMAL LOW (ref 3.80–5.10)
RDW: 15.4 % — ABNORMAL HIGH (ref 11.0–15.0)
Total Lymphocyte: 15.2 %
WBC: 7.9 10*3/uL (ref 3.8–10.8)

## 2019-06-18 MED ORDER — CEPHALEXIN 500 MG PO CAPS: ORAL_CAPSULE | ORAL | 0 refills | Status: DC

## 2019-06-18 NOTE — Progress Notes (Signed)
Pt will need a new order for Diabetic shoes from [employer]. Will let patient know where to call and order size.

## 2019-06-18 NOTE — Telephone Encounter (Signed)
Caller states that the pt needs an appt ASAP, her foot is swollen, I offered 1-28, he stated that was not good, I advised I would send to the Nurse

## 2019-06-18 NOTE — Assessment & Plan Note (Addendum)
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  I believe that it is now infected.   I will initiate her on antibiotics as well as send referral to wound clinic.  She was told to call the office if her symptoms worsen or do not improve by Monday.  She tells me she understands.

## 2019-06-18 NOTE — Patient Instructions (Addendum)
Thank you for choosing [facility] as your medical provider! If you have any questions or concerns regarding your health care, please do not hesitate to call our office.  Take your medications as prescribed.  I have prescribed you cephalexin (Keflex).  You will take 1 tablet by mouth in the morning and in the evening on the days that you do NOT go to dialysis.  On the days that you go to dialysis, take 1 tablet by mouth after dialysis.  IF YOUR FOOT AND LEG IS LOOKING WORSE, HURTING MORE, OR IS NOT IMPROVING BY Monday (06/22/19) CALL OUR OFFICE. WE MAY NEED TO CHANGE THE ANTIBIOTIC REGIMEN.  I will refer you to a wound clinic for further evaluation of the ulcer on your toe.  If you do not hear from the wound clinic to schedule this within 1 week please let our office know.  Please follow-up as scheduled in 2 weeks. We look forward to seeing you again soon!   At [facility] we value your feedback. You may receive a survey about your visit today. Please share your experience as we strive to create trusting relationships with our patients to provide genuine, compassionate, quality care.  We appreciate your understanding and patience as we review any laboratory studies, imaging, and other diagnostic tests that are ordered as we care for you. We do our best to address any and all results in a timely manner. If you do not hear about test results within 1 week, please do not hesitate to contact us. If we referred you to a specialist during your visit or ordered imaging testing, contact the office if you have not been contacted to be scheduled within 1 weeks.  We also encourage the use of MyChart, which contains your medical information for your review as well. If you are not enrolled in this feature, an access code is on this after visit summary for your convenience. Thank you for allowing [place] to be involved in your care.

## 2019-06-18 NOTE — Progress Notes (Signed)
Subjective:  Patient ID: [name], female    DOB: 19-Apr-1941  Age: 79 y.o. MRN: [id number]  CC:  Chief Complaint  Patient presents with  . Other    Leg swelling      HPI  This patient comes in today for an acute visit for the above.  She is on hemodialysis and last time she was seen by her nephrologist he noted that her right leg was red, swollen, hot and ordered ultrasound to rule out DVT.  Ultrasound came back negative for DVT.  He then recommended that she follow-up with her primary care provider for further evaluation.  She tells me that her leg started swelling approximately 2 nights ago.  It is on her right side.  She does have pain and does report that she has a sore on her great toe.  She tells me that sore has been present for about a year and a half.  She was being seen by a podiatrist at [facility] for this sore.  Unfortunately she stopped going to see her podiatrist at that office approximately 6 months ago due to having multiple medical issues and hospitalizations.    Past Medical History:  Diagnosis Date  . Anemia   . Arthritis   . Blood transfusion without reported diagnosis   . CAD (coronary artery disease)    a. 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  . Cataract   . Chronic anemia   . Chronic combined systolic and diastolic CHF (congestive heart failure) ([place])    a. 2D echo 08/2016 at [facility]: EF 50-55% with inferior wall HK, impaired LV filling, fair study.  . CKD (chronic kidney disease), stage III   . Diabetes ([place])   . Gastritis   . GERD (gastroesophageal reflux disease)   . Gout   . Headache   . HTN (hypertension)   . Hyperlipidemia   . Hypothyroidism   . Iron deficiency anemia 11/08/2016  . Normocytic anemia 10/26/2016  . NSTEMI (non-ST elevated myocardial infarction) ([place])    a. Complex admission 08/2016 - with severe hyperglycemia, AKI on CKD, severe anemia down to Hgb 6.8, acute combined CHF, troponin of  8.5, cath deferred due to renal dysfunction.  . Thyroid disease   . Wears dentures       Family History  Problem Relation Age of Onset  . Diabetes Mother   . Asthma Mother   . Early death Mother 94       Pneumonia  . Early death Father        Killed at rodeo  . CAD Neg Hx   . GI Bleed Neg Hx     Social History   Social History Narrative   Lives with significant other/partner [name]   He is her caregiver   Moved from [facility]      Her Son Austine Kelsay (lives In [place]) -- ph # [phone] ( would like to be called about care issues)   Social History   Tobacco Use  . Smoking status: Never Smoker  . Smokeless tobacco: Never Used  Substance Use Topics  . Alcohol use: No    Alcohol/week: 0.0 standard drinks     Current Meds  Medication Sig  . acetaminophen (TYLENOL) 325 MG tablet Take 2 tablets (650 mg total) by mouth every 6 (six) hours as needed for mild pain or headache.  Marland Kitchen amiodarone (PACERONE) 200 MG tablet Take 1 tablet (200 mg total) by mouth  daily.  . amLODipine (NORVASC) 10 MG tablet Take 1 tablet by mouth once daily (Patient taking differently: Take 10 mg by mouth daily. Mon, Wed, Fri)  . Ascorbic Acid (VITAMIN C) 100 MG tablet Take 1 tablet (100 mg total) by mouth daily. (Patient taking differently: Take 100 mg by mouth 2 (two) times daily. )  . aspirin EC 81 MG EC tablet Take 1 tablet (81 mg total) by mouth daily.  . calcium acetate (PHOSLO) 667 MG capsule Take 667 mg by mouth See admin instructions. 3 capsules with each meal and 1 with snacks daily  . Cholecalciferol (VITAMIN D-3) 125 MCG (5000 UT) TABS Take 5,000 mg by mouth daily.   . Evolocumab (REPATHA SURECLICK) 737 MG/ML SOAJ Inject 140 mg into the skin every 14 (fourteen) days.  . febuxostat (ULORIC) 40 MG tablet Take 1 tablet by mouth once daily  . metoprolol succinate (TOPROL XL) 25 MG 24 hr tablet Take 0.5 tablets (12.5 mg total) by mouth daily.  . Multiple Vitamin (MULTIVITAMIN WITH MINERALS) TABS tablet  Take 1 tablet by mouth daily.  . NP THYROID 120 MG tablet Take 1 tablet by mouth once daily  . pantoprazole (PROTONIX) 40 MG tablet Take 1 tablet (40 mg total) by mouth 2 (two) times daily. 30 minutes before breakfast (Patient taking differently: Take 40 mg by mouth daily before breakfast. )  . warfarin (COUMADIN) 2.5 MG tablet Take 1 1/2 tablets daily or as directed (Patient taking differently: Take 2.5 mg by mouth See admin instructions. 5mg  every other day and 2.5mg  every other day alternating)    ROS:  Review of Systems  Constitutional: Negative for fever.  Skin:       (+) Skin sore to first digit on right foot, (+) swelling to right lower extremity, (+) redness to right lower extremity     Objective:   Today's Vitals: BP (!) 146/62   Pulse 78   Temp (!) 97.4 F (36.3 C)   Resp 14   Ht 5\' 4"  (1.626 m)   Wt 172 lb (78 kg)   SpO2 98%   BMI 29.52 kg/m  Vitals with BMI 06/18/2019 06/09/2019 05/23/2019  Height 5\' 4"  5\' 4"  -  Weight 172 lbs 171 lbs -  BMI 10.62 69.48 -  Systolic 546 270 350  Diastolic 62 77 55  Pulse 78 79 -     Physical Exam Vitals reviewed.  Constitutional:      General: She is not in acute distress.    Appearance: Normal appearance.  HENT:     Head: Normocephalic and atraumatic.  Cardiovascular:     Rate and Rhythm: Normal rate and regular rhythm.     Pulses: Normal pulses.          Dorsalis pedis pulses are 2+ on the right side.     Heart sounds: Normal heart sounds.  Pulmonary:     Effort: Pulmonary effort is normal.     Breath sounds: Normal breath sounds.  Musculoskeletal:     Right lower leg: 2+ Edema present.       Feet:  Skin:    General: Skin is warm and dry.     Findings: Erythema, lesion and rash present.          Comments: Patient has red, warm to touch, edematous skin about halfway up her right calf and shin.  Neurological:     General: No focal deficit present.     Mental Status: She is alert and oriented to  person, place, and  time.  Psychiatric:        Mood and Affect: Mood normal.        Behavior: Behavior normal.        Judgment: Judgment normal.          Assessment   1. Diabetic ulcer of other part of right foot associated with diabetes mellitus due to underlying condition, limited to breakdown of skin ([place])       Tests ordered Orders Placed This Encounter  Procedures  . CBC with Differential/Platelets     Plan: Please see assessment and plan per problem list below.   Meds ordered this encounter  Medications  . cephALEXin (KEFLEX) 500 MG capsule    Sig: Take 1 tablet by mouth twice a day on days you do not do dialysis. On dialysis days take 1 tablet by mouth after dialysis    Dispense:  15 capsule    Refill:  0    Order Specific Question:   Supervising Provider    Answer:   Doree Albee [[id number]]    Patient to follow-up in 2 weeks.  Ailene Ards, NP

## 2019-06-18 NOTE — Telephone Encounter (Signed)
I was able to contact the person that called earlier today on behalf of this patient.  We do have a cancellation today at 1 PM, and I told the caller that the patient could come for an acute visit if they could be here by 1.  He tells me he will get her here by 1.  She has been added to the schedule.

## 2019-06-18 NOTE — Telephone Encounter (Signed)
Thayer Headings @ [facility] called  stated they sent Pt to [facility] for RLE swelling to have a doppler done to R/O DVT . If you need to call them ;[phone] opt 3

## 2019-06-18 NOTE — Telephone Encounter (Signed)
Called pt x 2 with no answer.  Left message on voice mail x 2 for pt to call back.

## 2019-06-18 NOTE — Telephone Encounter (Signed)
Pt called asking to speak with you, please give her a call back   [phone]

## 2019-06-19 DIAGNOSIS — N186 End stage renal disease: Secondary | ICD-10-CM | POA: Diagnosis not present

## 2019-06-19 DIAGNOSIS — Z992 Dependence on renal dialysis: Secondary | ICD-10-CM | POA: Diagnosis not present

## 2019-06-19 DIAGNOSIS — Z23 Encounter for immunization: Secondary | ICD-10-CM | POA: Diagnosis not present

## 2019-06-22 DIAGNOSIS — Z23 Encounter for immunization: Secondary | ICD-10-CM | POA: Diagnosis not present

## 2019-06-22 DIAGNOSIS — Z992 Dependence on renal dialysis: Secondary | ICD-10-CM | POA: Diagnosis not present

## 2019-06-22 DIAGNOSIS — N186 End stage renal disease: Secondary | ICD-10-CM | POA: Diagnosis not present

## 2019-06-23 ENCOUNTER — Telehealth: Payer: Self-pay | Admitting: *Deleted

## 2019-06-23 NOTE — Telephone Encounter (Signed)
Patient called requesting to speak with Cristopher Peru. In regards to her coumdin . Please call [phone].

## 2019-06-23 NOTE — Telephone Encounter (Signed)
INR appt made for 06/29/19

## 2019-06-24 ENCOUNTER — Telehealth (INDEPENDENT_AMBULATORY_CARE_PROVIDER_SITE_OTHER): Payer: Self-pay

## 2019-06-24 ENCOUNTER — Other Ambulatory Visit (INDEPENDENT_AMBULATORY_CARE_PROVIDER_SITE_OTHER): Payer: Self-pay | Admitting: Internal Medicine

## 2019-06-24 ENCOUNTER — Other Ambulatory Visit: Payer: Self-pay

## 2019-06-24 ENCOUNTER — Emergency Department (HOSPITAL_COMMUNITY): Payer: Medicare HMO

## 2019-06-24 ENCOUNTER — Encounter (HOSPITAL_COMMUNITY): Payer: Self-pay | Admitting: *Deleted

## 2019-06-24 ENCOUNTER — Emergency Department (HOSPITAL_COMMUNITY)
Admission: EM | Admit: 2019-06-24 | Discharge: 2019-06-25 | Disposition: A | Discharge status: Home / Self Care | Payer: Medicare HMO | Attending: Emergency Medicine | Admitting: Emergency Medicine

## 2019-06-24 ENCOUNTER — Other Ambulatory Visit (INDEPENDENT_AMBULATORY_CARE_PROVIDER_SITE_OTHER): Payer: Self-pay

## 2019-06-24 DIAGNOSIS — N186 End stage renal disease: Secondary | ICD-10-CM | POA: Diagnosis not present

## 2019-06-24 DIAGNOSIS — E1122 Type 2 diabetes mellitus with diabetic chronic kidney disease: Secondary | ICD-10-CM | POA: Insufficient documentation

## 2019-06-24 DIAGNOSIS — I120 Hypertensive chronic kidney disease with stage 5 chronic kidney disease or end stage renal disease: Secondary | ICD-10-CM | POA: Insufficient documentation

## 2019-06-24 DIAGNOSIS — Z7982 Long term (current) use of aspirin: Secondary | ICD-10-CM | POA: Insufficient documentation

## 2019-06-24 DIAGNOSIS — E039 Hypothyroidism, unspecified: Secondary | ICD-10-CM | POA: Diagnosis not present

## 2019-06-24 DIAGNOSIS — R2689 Other abnormalities of gait and mobility: Secondary | ICD-10-CM

## 2019-06-24 DIAGNOSIS — Z79899 Other long term (current) drug therapy: Secondary | ICD-10-CM | POA: Diagnosis not present

## 2019-06-24 DIAGNOSIS — K5900 Constipation, unspecified: Secondary | ICD-10-CM | POA: Diagnosis not present

## 2019-06-24 DIAGNOSIS — Z992 Dependence on renal dialysis: Secondary | ICD-10-CM | POA: Diagnosis not present

## 2019-06-24 DIAGNOSIS — Z951 Presence of aortocoronary bypass graft: Secondary | ICD-10-CM | POA: Diagnosis not present

## 2019-06-24 DIAGNOSIS — I2510 Atherosclerotic heart disease of native coronary artery without angina pectoris: Secondary | ICD-10-CM | POA: Insufficient documentation

## 2019-06-24 DIAGNOSIS — R5381 Other malaise: Secondary | ICD-10-CM | POA: Diagnosis not present

## 2019-06-24 DIAGNOSIS — I10 Essential (primary) hypertension: Secondary | ICD-10-CM | POA: Diagnosis not present

## 2019-06-24 DIAGNOSIS — R109 Unspecified abdominal pain: Secondary | ICD-10-CM | POA: Diagnosis not present

## 2019-06-24 DIAGNOSIS — Z23 Encounter for immunization: Secondary | ICD-10-CM | POA: Diagnosis not present

## 2019-06-24 LAB — CBC WITH DIFFERENTIAL/PLATELET
Abs Immature Granulocytes: 0.42 10*3/uL — ABNORMAL HIGH (ref 0.00–0.07)
Basophils Absolute: 0.1 10*3/uL (ref 0.0–0.1)
Basophils Relative: 1 %
Eosinophils Absolute: 0.1 10*3/uL (ref 0.0–0.5)
Eosinophils Relative: 1 %
HCT: 28.2 % — ABNORMAL LOW (ref 36.0–46.0)
Hemoglobin: 8.1 g/dL — ABNORMAL LOW (ref 12.0–15.0)
Immature Granulocytes: 5 %
Lymphocytes Relative: 13 %
Lymphs Abs: 1.1 10*3/uL (ref 0.7–4.0)
MCH: 28 pg (ref 26.0–34.0)
MCHC: 28.7 g/dL — ABNORMAL LOW (ref 30.0–36.0)
MCV: 97.6 fL (ref 80.0–100.0)
Monocytes Absolute: 0.9 10*3/uL (ref 0.1–1.0)
Monocytes Relative: 10 %
Neutro Abs: 5.9 10*3/uL (ref 1.7–7.7)
Neutrophils Relative %: 70 %
Platelets: 411 10*3/uL — ABNORMAL HIGH (ref 150–400)
RBC: 2.89 MIL/uL — ABNORMAL LOW (ref 3.87–5.11)
RDW: 17.1 % — ABNORMAL HIGH (ref 11.5–15.5)
WBC: 8.4 10*3/uL (ref 4.0–10.5)
nRBC: 0 % (ref 0.0–0.2)

## 2019-06-24 NOTE — ED Triage Notes (Signed)
Pt c/o abdominal pain with constipation x 2 days; pt states her last BM was Monday; pt has been using stool softeners and enemas with no relief

## 2019-06-24 NOTE — Telephone Encounter (Signed)
Sent over order via fax to [facility].

## 2019-06-24 NOTE — Progress Notes (Unsigned)
Mr [name], companion called asking for a lift chair to help with ADL up in day.

## 2019-06-24 NOTE — Telephone Encounter (Signed)
Please send the order to the place in [place] where the wheelchair was ordered from -

## 2019-06-24 NOTE — Telephone Encounter (Signed)
[name] is calling wanting to know if you can order a lift chair for the pt

## 2019-06-24 NOTE — ED Provider Notes (Signed)
[facility] EMERGENCY DEPARTMENT Provider Note   CSN: [id number] Arrival date & time: 06/24/19  2105     History Chief Complaint  Patient presents with  . Abdominal Pain  . Constipation    [name] is a 79 y.o. female.  Patient with h/o ESRD-HD, DM HTN, HLD, CAD, atrial fibrillation presents with constipation for the past 4 days. She reports generalized abdominal pain, denies vomiting, fever. She is using Miralax and enemas (4 in the last 4 days) without significant bowel movements, reporting few small, hard balls of stool. No melena. She denies abdominal surgeries in the past. She denies passing gas per rectum and reports frequent belching.   The history is provided by the patient. No language interpreter was used.  Abdominal Pain Associated symptoms: constipation   Associated symptoms: no chest pain, no fever, no nausea, no shortness of breath and no vomiting   Constipation Associated symptoms: abdominal pain   Associated symptoms: no fever, no nausea and no vomiting        Past Medical History:  Diagnosis Date  . Anemia   . Arthritis   . Blood transfusion without reported diagnosis   . CAD (coronary artery disease)    a. 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  . Cataract   . Chronic anemia   . Chronic combined systolic and diastolic CHF (congestive heart failure) ([place])    a. 2D echo 08/2016 at [facility]: EF 50-55% with inferior wall HK, impaired LV filling, fair study.  . CKD (chronic kidney disease), stage III   . Diabetes ([place])   . Gastritis   . GERD (gastroesophageal reflux disease)   . Gout   . Headache   . HTN (hypertension)   . Hyperlipidemia   . Hypothyroidism   . Iron deficiency anemia 11/08/2016  . Normocytic anemia 10/26/2016  . NSTEMI (non-ST elevated myocardial infarction) ([place])    a. Complex admission 08/2016 - with severe hyperglycemia, AKI on CKD, severe anemia down to Hgb 6.8, acute combined CHF, troponin of 8.5, cath  deferred due to renal dysfunction.  . Thyroid disease   . Wears dentures     Patient Active Problem List   Diagnosis Date Noted  . Diabetic ulcer of right foot ([place]) 06/18/2019  . Acute hypoxemic respiratory failure due to COVID-19 ([place]) 05/19/2019  . Atrial fibrillation ([place]) 12/03/2018  . Encounter for therapeutic drug monitoring 12/03/2018  . Pressure injury of skin 11/24/2018  . CAD (coronary artery disease) 11/21/2018  . Acute on chronic diastolic CHF (congestive heart failure) ([place]) 11/21/2018  . Respiratory failure with hypoxia ([place]) 11/21/2018  . End-stage renal disease on hemodialysis ([place]) 11/21/2018  . Diabetes mellitus type 2, controlled, with complications ([place]) 37/02/6268  . 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 ESRD (end stage renal disease) on dialysis ([place]) 11/12/2018  . Non-ST elevation (NSTEMI) myocardial infarction ([place]) 11/06/2018  . Polyp of transverse colon 10/08/2017  . Anemia 08/07/2017  . GI bleed 07/30/2017  . Hyperkalemia 07/30/2017  . Essential hypertension, benign 12/13/2016  . Hypothyroidism 12/13/2016  . Iron deficiency anemia 11/08/2016  . Normocytic anemia 10/26/2016  . Elevated troponin   . Aortic atherosclerosis ([place]) 10/25/2016  . Symptomatic anemia 10/04/2016  . History of non-ST elevation myocardial infarction (NSTEMI) 10/04/2016  . Mixed hyperlipidemia 10/04/2016  . Neuropathy 09/20/2016  . Chronic kidney disease, stage IV (severe) ([place]) 08/03/2015  . Hip fracture ([place]) 02/05/2015  . HTN (hypertension) 02/05/2015  . Gout 02/05/2015  . Left  humeral fracture 02/05/2015  . Hyperlipidemia associated with type 2 diabetes mellitus ([place]) 05/01/2013    Past Surgical History:  Procedure Laterality Date  . BASCILIC VEIN TRANSPOSITION Right 06/12/2018   Procedure: FIRST STAGE BASCILIC VEIN TRANSPOSITION RIGHT ARM;  Surgeon: Rosetta Posner, MD;  Location: [facility];  Service: Vascular;  Laterality: Right;  . BASCILIC VEIN TRANSPOSITION Right 08/07/2018    Procedure: BASCILIC VEIN TRANSPOSITION SECOND STAGE RIGHT ARM;  Surgeon: Rosetta Posner, MD;  Location: [facility];  Service: Vascular;  Laterality: Right;  . CATARACT EXTRACTION     left  . COLONOSCOPY WITH PROPOFOL N/A 10/08/2016   5 mm transverse colon polyp note resected due to plavix. hemorrhoids  . CORONARY ARTERY BYPASS GRAFT N/A 11/20/2018   Procedure: CORONARY ARTERY BYPASS GRAFTING (CABG), ON PUMP, TIMES four, USING LEFT INTERNAL MAMMARY ARTERY AND ENDOSCOPICALLY HARVESTED RIGHT GREATER SAPHENOUS VEIN;  Surgeon: Melrose Nakayama, MD;  Location: [facility];  Service: Open Heart Surgery;  Laterality: N/A;  . ENTEROSCOPY N/A 08/08/2017   Procedure: ENTEROSCOPY;  Surgeon: Daneil Dolin, MD;  [name];  Service: Endoscopy;  Laterality: N/A;  . ESOPHAGOGASTRODUODENOSCOPY N/A 10/06/2016   mild chroni gastritis, negative H.pylori  . ESOPHAGOGASTRODUODENOSCOPY (EGD) WITH PROPOFOL N/A 03/12/2017   mild edema/erythema of stomach, small bowel biopsy with focal villous tip lymphocytosis, ?partially developed celiac  . 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 GIVENS CAPSULE STUDY  10/08/2016   normal  . GIVENS CAPSULE STUDY N/A 10/29/2016   occasional gastric erosion, unremarkable small bowel  . LEFT HEART CATH AND CORONARY ANGIOGRAPHY N/A 11/12/2018   Procedure: LEFT HEART CATH AND CORONARY ANGIOGRAPHY;  Surgeon: Burnell Blanks, MD;  Location: [facility];  Service: Cardiovascular;  Laterality: N/A;  . ORIF HUMERUS FRACTURE Left 02/07/2015   Procedure: OPEN REDUCTION INTERNAL FIXATION (ORIF) PROXIMAL HUMERUS FRACTURE;  Surgeon: Marybelle Killings, MD;  Location: [facility];  Service: Orthopedics;  Laterality: Left;  . TEE WITHOUT CARDIOVERSION N/A 11/20/2018    Procedure: TRANSESOPHAGEAL ECHOCARDIOGRAM (TEE);  Surgeon: Melrose Nakayama, MD;  Location: [facility];  Service: Open Heart Surgery;  Laterality: N/A;  . teeth extractions    . THYROID SURGERY    . TOTAL HIP ARTHROPLASTY Left 02/07/2015   Procedure: TOTAL HIP ARTHROPLASTY ANTERIOR APPROACH ;  Surgeon: Marybelle Killings, MD;  Location: [facility];  Service: Orthopedics;  Laterality: Left;  . WRIST SURGERY Left      OB History   No obstetric history on file.     Family History  Problem Relation Age of Onset  . Diabetes Mother   . Asthma Mother   . Early death Mother 74       Pneumonia  . Early death Father        Killed at rodeo  . CAD Neg Hx   . GI Bleed Neg Hx     Social History   Tobacco Use  . Smoking status: Never Smoker  . Smokeless tobacco: Never Used  Substance Use Topics  . Alcohol use: No    Alcohol/week: 0.0 standard drinks  . Drug use: No    Home Medications Prior to Admission medications   Medication Sig Start Date End Date Taking? Authorizing Provider  gabapentin (NEURONTIN) 300 MG capsule Take 1 capsule by mouth 2 (two)  times daily. 06/12/19  Yes [provider]  acetaminophen (TYLENOL) 325 MG tablet Take 2 tablets (650 mg total) by mouth every 6 (six) hours as needed for mild pain or headache. 11/28/18   Nani Skillern, PA-C  amiodarone (PACERONE) 200 MG tablet Take 1 tablet (200 mg total) by mouth daily. 01/28/19   Arnoldo Lenis, MD  amLODipine (NORVASC) 10 MG tablet Take 1 tablet by mouth once daily Patient taking differently: Take 10 mg by mouth daily. Jory Sims, Fri 05/12/19   Pixie Casino, MD  Ascorbic Acid (VITAMIN C) 100 MG tablet Take 1 tablet (100 mg total) by mouth daily. Patient taking differently: Take 100 mg by mouth 2 (two) times daily.  11/28/18   Nani Skillern, PA-C  aspirin EC 81 MG EC tablet Take 1 tablet (81 mg total) by mouth daily. 11/28/18   Nani Skillern, PA-C  calcium acetate (PHOSLO) 667 MG capsule Take 667 mg  by mouth See admin instructions. 3 capsules with each meal and 1 with snacks daily    [provider]  cephALEXin (KEFLEX) 500 MG capsule Take 1 tablet by mouth twice a day on days you do not do dialysis. On dialysis days take 1 tablet by mouth after dialysis 06/18/19   Ailene Ards, NP  Cholecalciferol (VITAMIN D-3) 125 MCG (5000 UT) TABS Take 5,000 mg by mouth daily.     [provider]  Evolocumab (REPATHA SURECLICK) 785 MG/ML SOAJ Inject 140 mg into the skin every 14 (fourteen) days. 01/06/19   Arnoldo Lenis, MD  febuxostat (ULORIC) 40 MG tablet Take 1 tablet by mouth once daily 06/16/19   Hurshel Party C, MD  metoprolol succinate (TOPROL XL) 25 MG 24 hr tablet Take 0.5 tablets (12.5 mg total) by mouth daily. 11/28/18 11/28/19  Nani Skillern, PA-C  Multiple Vitamin (MULTIVITAMIN WITH MINERALS) TABS tablet Take 1 tablet by mouth daily.    [provider]  NP THYROID 120 MG tablet Take 1 tablet by mouth once daily 06/16/19 07/16/19  Hurshel Party C, MD  pantoprazole (PROTONIX) 40 MG tablet Take 1 tablet (40 mg total) by mouth 2 (two) times daily. 30 minutes before breakfast Patient taking differently: Take 40 mg by mouth daily before breakfast.  11/27/17   Annitta Needs, NP  warfarin (COUMADIN) 2.5 MG tablet Take 1 1/2 tablets daily or as directed Patient taking differently: Take 2.5 mg by mouth See admin instructions. 5mg  every other day and 2.5mg  every other day alternating 01/05/19   Arnoldo Lenis, MD  gabapentin (NEURONTIN) 300 MG capsule Take 2 capsules (600 mg total) by mouth at bedtime. 11/18/17   Mahala Menghini, PA-C  gabapentin (NEURONTIN) 300 MG capsule TAKE 2 CAPSULES BY MOUTH ONCE DAILY AT NIGHT 01/30/19   Doree Albee, MD  NP THYROID 120 MG tablet Take 120 mg by mouth daily. 08/28/18   [provider]    Allergies    Insulin glargine and Statins  Review of Systems   Review of Systems  Constitutional: Negative for fever.    Respiratory: Negative for shortness of breath.   Cardiovascular: Negative for chest pain.  Gastrointestinal: Positive for abdominal pain and constipation. Negative for blood in stool, nausea and vomiting.  Musculoskeletal: Negative for myalgias.  Neurological: Negative for weakness and light-headedness.    Physical Exam Updated Vital Signs BP (!) 159/54   Pulse 71   Resp 16   Ht 5\' 4"  (1.626 m)   Wt  74.8 kg   SpO2 97%   BMI 28.32 kg/m   Physical Exam Vitals and nursing note reviewed.  Constitutional:      Appearance: She is well-developed. She is obese.  Abdominal:     General: Bowel sounds are normal.     Palpations: Abdomen is soft.     Tenderness: There is generalized abdominal tenderness. There is no guarding or rebound.  Genitourinary:    Rectum: Normal.     Comments: No fecal impaction in rectum.  Neurological:     Mental Status: She is alert.     ED Results / Procedures / Treatments   Labs (all labs ordered are listed, but only abnormal results are displayed) Labs Reviewed  COMPREHENSIVE METABOLIC PANEL  CBC WITH DIFFERENTIAL/PLATELET  URINALYSIS, ROUTINE W REFLEX MICROSCOPIC    EKG None  Radiology No results found.  Procedures Procedures (including critical care time)  Medications Ordered in ED Medications - No data to display  ED Course  I have reviewed the triage vital signs and the nursing notes.  Pertinent labs & imaging results that were available during my care of the patient were reviewed by me and considered in my medical decision making (see chart for details).    MDM Rules/Calculators/A&P                      Patient to ED with abdominal pain and constipation x 4 days despite laxative use. No vomiting. No previous surgeries.   She is very well appearing. In no distress. Abdominal exam is essentially benign. No fecal impaction. Abdominal film ordered to assess stool burden.   There is evidence of stool, normal BGP. Labs are stable  wihtout acute abnormality.   Discussed increasing her fluids over her dialysis maximum to aid relief of constipation; will recommend Miralax, dietary fiber as well as supplement, stool softener.  Final Clinical Impression(s) / ED Diagnoses Final diagnoses:  None   1. Constipation  Rx / DC Orders ED Discharge Orders    None       Dennie Bible 06/25/19 0131    Elnora Morrison, MD 06/25/19 [phone]

## 2019-06-24 NOTE — Telephone Encounter (Signed)
I do not know who that was . He will ned to have them to call our office or given information

## 2019-06-25 DIAGNOSIS — R109 Unspecified abdominal pain: Secondary | ICD-10-CM | POA: Diagnosis not present

## 2019-06-25 DIAGNOSIS — K5900 Constipation, unspecified: Secondary | ICD-10-CM | POA: Diagnosis not present

## 2019-06-25 LAB — COMPREHENSIVE METABOLIC PANEL
ALT: 12 U/L (ref 0–44)
AST: 33 U/L (ref 15–41)
Albumin: 2.5 g/dL — ABNORMAL LOW (ref 3.5–5.0)
Alkaline Phosphatase: 82 U/L (ref 38–126)
Anion gap: 10 (ref 5–15)
BUN: 16 mg/dL (ref 8–23)
CO2: 28 mmol/L (ref 22–32)
Calcium: 8.1 mg/dL — ABNORMAL LOW (ref 8.9–10.3)
Chloride: 98 mmol/L (ref 98–111)
Creatinine, Ser: 2.86 mg/dL — ABNORMAL HIGH (ref 0.44–1.00)
GFR calc Af Amer: 17 mL/min — ABNORMAL LOW (ref >60)
GFR calc non Af Amer: 15 mL/min — ABNORMAL LOW (ref >60)
Glucose, Bld: 100 mg/dL — ABNORMAL HIGH (ref 70–99)
Potassium: 3.7 mmol/L (ref 3.5–5.1)
Sodium: 136 mmol/L (ref 135–145)
Total Bilirubin: 0.8 mg/dL (ref 0.3–1.2)
Total Protein: 6.1 g/dL — ABNORMAL LOW (ref 6.5–8.1)

## 2019-06-25 NOTE — Discharge Instructions (Addendum)
You can increase your fluid intake beyond your dialysis limit of 32 oz until your constipation resolved. Continue your stool softener (Dulcolax or Colace). Use Miralax three times daily until you have a bowel movement, for a maximum of 3 consecutive days. Increase your dietary fiber using the food guide in your discharge paperwork. You can also use a fiber supplement like Benefiber or Metamucil.   If you develop severe abdominal pain, run a fever, start vomiting in the setting of continued constipation, please return to the emergency department.

## 2019-06-26 DIAGNOSIS — Z23 Encounter for immunization: Secondary | ICD-10-CM | POA: Diagnosis not present

## 2019-06-26 DIAGNOSIS — Z992 Dependence on renal dialysis: Secondary | ICD-10-CM | POA: Diagnosis not present

## 2019-06-26 DIAGNOSIS — N186 End stage renal disease: Secondary | ICD-10-CM | POA: Diagnosis not present

## 2019-06-28 DIAGNOSIS — Z992 Dependence on renal dialysis: Secondary | ICD-10-CM | POA: Diagnosis not present

## 2019-06-28 DIAGNOSIS — N186 End stage renal disease: Secondary | ICD-10-CM | POA: Diagnosis not present

## 2019-06-29 ENCOUNTER — Ambulatory Visit (INDEPENDENT_AMBULATORY_CARE_PROVIDER_SITE_OTHER): Payer: Medicare HMO | Admitting: *Deleted

## 2019-06-29 ENCOUNTER — Other Ambulatory Visit: Payer: Self-pay

## 2019-06-29 DIAGNOSIS — Z5181 Encounter for therapeutic drug level monitoring: Secondary | ICD-10-CM

## 2019-06-29 DIAGNOSIS — I480 Paroxysmal atrial fibrillation: Secondary | ICD-10-CM

## 2019-06-29 DIAGNOSIS — Z992 Dependence on renal dialysis: Secondary | ICD-10-CM | POA: Diagnosis not present

## 2019-06-29 DIAGNOSIS — N186 End stage renal disease: Secondary | ICD-10-CM | POA: Diagnosis not present

## 2019-06-29 LAB — POCT INR: INR: 2 (ref 2.0–3.0)

## 2019-06-29 NOTE — Patient Instructions (Signed)
Continue warfarin 1 1/2 tablets (7.5mg ) daily except 1 tablet (5mg ) on Tuesdays, Thursdays and Saturdays Recheck in 4 weeks

## 2019-06-30 DIAGNOSIS — Z7982 Long term (current) use of aspirin: Secondary | ICD-10-CM | POA: Diagnosis not present

## 2019-06-30 DIAGNOSIS — L97518 Non-pressure chronic ulcer of other part of right foot with other specified severity: Secondary | ICD-10-CM | POA: Diagnosis not present

## 2019-06-30 DIAGNOSIS — E11621 Type 2 diabetes mellitus with foot ulcer: Secondary | ICD-10-CM | POA: Diagnosis not present

## 2019-06-30 DIAGNOSIS — Z79899 Other long term (current) drug therapy: Secondary | ICD-10-CM | POA: Diagnosis not present

## 2019-06-30 DIAGNOSIS — L84 Corns and callosities: Secondary | ICD-10-CM | POA: Diagnosis not present

## 2019-06-30 DIAGNOSIS — L89319 Pressure ulcer of right buttock, unspecified stage: Secondary | ICD-10-CM | POA: Diagnosis not present

## 2019-06-30 DIAGNOSIS — I2510 Atherosclerotic heart disease of native coronary artery without angina pectoris: Secondary | ICD-10-CM | POA: Diagnosis not present

## 2019-06-30 DIAGNOSIS — L97519 Non-pressure chronic ulcer of other part of right foot with unspecified severity: Secondary | ICD-10-CM | POA: Diagnosis not present

## 2019-06-30 DIAGNOSIS — Z833 Family history of diabetes mellitus: Secondary | ICD-10-CM | POA: Diagnosis not present

## 2019-06-30 DIAGNOSIS — Z7901 Long term (current) use of anticoagulants: Secondary | ICD-10-CM | POA: Diagnosis not present

## 2019-07-01 DIAGNOSIS — Z992 Dependence on renal dialysis: Secondary | ICD-10-CM | POA: Diagnosis not present

## 2019-07-01 DIAGNOSIS — N186 End stage renal disease: Secondary | ICD-10-CM | POA: Diagnosis not present

## 2019-07-02 ENCOUNTER — Ambulatory Visit (INDEPENDENT_AMBULATORY_CARE_PROVIDER_SITE_OTHER): Payer: Medicare HMO | Admitting: Nurse Practitioner

## 2019-07-02 ENCOUNTER — Other Ambulatory Visit: Payer: Self-pay

## 2019-07-02 VITALS — BP 140/52 | Temp 98.4°F | Resp 14 | Ht 64.0 in | Wt 165.0 lb

## 2019-07-02 DIAGNOSIS — L03115 Cellulitis of right lower limb: Secondary | ICD-10-CM | POA: Diagnosis not present

## 2019-07-02 DIAGNOSIS — L0390 Cellulitis, unspecified: Secondary | ICD-10-CM | POA: Insufficient documentation

## 2019-07-02 DIAGNOSIS — W19XXXA Unspecified fall, initial encounter: Secondary | ICD-10-CM

## 2019-07-02 DIAGNOSIS — L97511 Non-pressure chronic ulcer of other part of right foot limited to breakdown of skin: Secondary | ICD-10-CM

## 2019-07-02 DIAGNOSIS — E08621 Diabetes mellitus due to underlying condition with foot ulcer: Secondary | ICD-10-CM

## 2019-07-02 DIAGNOSIS — E11621 Type 2 diabetes mellitus with foot ulcer: Secondary | ICD-10-CM | POA: Diagnosis not present

## 2019-07-02 MED ORDER — CEPHALEXIN 500 MG PO CAPS: ORAL_CAPSULE | ORAL | 0 refills | Status: DC

## 2019-07-02 NOTE — Progress Notes (Signed)
Subjective:  Patient ID: [name], female    DOB: [birth year]  Age: 79 y.o. MRN: [id number]  CC:  Chief Complaint  Patient presents with  . Follow-up    Cellulitis; foot ulcer      HPI  This patient presents today for the above.  She was seen approximately 2 weeks ago for redness and swelling to her right lower leg.  At that time I noted that she had an ulceration to her right great toe.  She was referred to wound clinic.  I also initiated her on a course of Keflex for probable cellulitis.  Today she tells me she has been seen at the wound clinic.  She tells me a few days ago she hit her right heel on the back of her walker and since then has burning pain when she puts weight on her right foot.  She tells me that the wound clinic is aware of this and they did x-rays.  She tells me they called her and told her that when she comes back to see them next week she will be fitted for a cast.  It sounds like she may have fractured part of her ankle.  As far as her cellulitis goes she tells me that the redness to her leg initially got better but seems to have come back since hitting the leg on her walker.  She denies open sore from where she hit her leg, but she does mention she had a markable indentation on her leg.  Patient also have a fall outside of this office prior to coming in.  This fall was witnessed by her significant other who helps her into the office today.  She denies hitting her head.  She denies any additional pain to leg or hip.  She does ambulate into the office today using her walker.   Past Medical History:  Diagnosis Date  . Anemia   . Arthritis   . Blood transfusion without reported diagnosis   . CAD (coronary artery disease)    a. 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  . Cataract   . Chronic anemia   . Chronic combined systolic and diastolic CHF (congestive heart failure) ([name])    a. 2D echo 08/2016 at [facility]: EF 50-55% with inferior  wall HK, impaired LV filling, fair study.  . CKD (chronic kidney disease), stage III   . Diabetes ([name])   . Gastritis   . GERD (gastroesophageal reflux disease)   . Gout   . Headache   . HTN (hypertension)   . Hyperlipidemia   . Hypothyroidism   . Iron deficiency anemia 11/08/2016  . Normocytic anemia 10/26/2016  . NSTEMI (non-ST elevated myocardial infarction) ([place])    a. Complex admission 08/2016 - with severe hyperglycemia, AKI on CKD, severe anemia down to Hgb 6.8, acute combined CHF, troponin of 8.5, cath deferred due to renal dysfunction.  . Thyroid disease   . Wears dentures       Family History  Problem Relation Age of Onset  . Diabetes Mother   . Asthma Mother   . Early death Mother 36       Pneumonia  . Early death Father        Killed at rodeo  . CAD Neg Hx   . GI Bleed Neg Hx     Social History   Social History Narrative   Lives with significant other/partner [name]   He is her caregiver  Moved from [facility]      Her Son [name] (lives In [place]) -- ph # [phone] ( would like to be called about care issues)   Social History   Tobacco Use  . Smoking status: Never Smoker  . Smokeless tobacco: Never Used  Substance Use Topics  . Alcohol use: No    Alcohol/week: 0.0 standard drinks     No outpatient medications have been marked as taking for the 07/02/19 encounter (Office Visit) with Ailene Ards, NP.    ROS:  Review of Systems  Constitutional: Negative for fever.  Cardiovascular: Positive for leg swelling.  Musculoskeletal: Positive for falls.       (+) leg pain  Skin: Positive for rash.     Objective:   Today's Vitals: BP (!) 140/52   Temp 98.4 F (36.9 C)   Resp 14   Ht 5\' 4"  (1.626 m)   Wt 165 lb (74.8 kg)   BMI 28.32 kg/m  Vitals with BMI 07/02/2019 06/25/2019 06/25/2019  Height 5\' 4"  - -  Weight 165 lbs - -  BMI 29.51 - -  Systolic 884 166 -  Diastolic 52 57 -  Pulse - 74 71     Physical Exam Vitals reviewed.    Constitutional:      General: She is not in acute distress.    Appearance: Normal appearance.  HENT:     Head: Normocephalic and atraumatic.  Neck:     Vascular: No carotid bruit.  Cardiovascular:     Rate and Rhythm: Normal rate and regular rhythm.     Pulses: Normal pulses.     Heart sounds: Normal heart sounds.  Pulmonary:     Effort: Pulmonary effort is normal.     Breath sounds: Normal breath sounds.  Musculoskeletal:     Right lower leg: 2+ Edema present.  Skin:    General: Skin is warm and dry.       Neurological:     General: No focal deficit present.     Mental Status: She is alert and oriented to person, place, and time.  Psychiatric:        Mood and Affect: Mood normal.        Behavior: Behavior normal.        Judgment: Judgment normal.          Assessment   1. Diabetic ulcer of other part of right foot associated with diabetes mellitus due to underlying condition, limited to breakdown of skin ([name])       Tests ordered No orders of the defined types were placed in this encounter.    Plan: Please see assessment and plan per problem list below.   Meds ordered this encounter  Medications  . cephALEXin (KEFLEX) 500 MG capsule    Sig: Take 1 tablet by mouth twice a day on days you do not do dialysis. On dialysis days take 1 tablet by mouth after dialysis    Dispense:  15 capsule    Refill:  0    Order Specific Question:   Supervising Provider    Answer:   Doree Albee [[id number]]    Patient to follow-up in 3 weeks with Dr. Anastasio Champion.  Ailene Ards, NP

## 2019-07-02 NOTE — Assessment & Plan Note (Signed)
Patient states she feels okay after her fall today.  She denies hitting her head.  She denies any worsening pain.  She tells me she was able to roll when she fell so she does not think she cause any trauma to herself.  I again encouraged her to use her wheelchair at home and avoid bearing weight on her extremities.  I am going to send referral to home health physical therapy for further evaluation and management of strength.

## 2019-07-02 NOTE — Assessment & Plan Note (Signed)
I did recommend starting her on a course of Bactrim.  She is uncomfortable with this after I discussed possible side effects including Stevens-Johnson syndrome.  I did tell her that this is a very rare side effect but it is serious.  She tells me she felt better when she was taking the Keflex and would prefer to do an additional course of Keflex first.  I will refill her Keflex and told her that if the redness to her leg worsens or if the pain worsens she is to call me right away and I really would recommend taking a course of Bactrim at that point.  She tells me she understands.  I also encouraged her to follow-up with the wound center and to keep weight off of her right heel as if she is being fitted to the cast she probably has a fracture.  She tells me she understands and will use her wheelchair.

## 2019-07-02 NOTE — Patient Instructions (Signed)
Thank you for choosing [facility] as your medical provider! If you have any questions or concerns regarding your health care, please do not hesitate to call our office.  Please follow-up with the wound clinic as scheduled next week.  Please use your wheelchair and avoid bearing weight on your right foot until you are reevaluated at the wound clinic.  I have resent antibiotics to the pharmacy.  If you feel your symptoms are worsening (rash on your leg is getting worse, pain is getting worse, or you spike a fever) please call our office right away and we will change to a different antibiotic.  Please follow-up as scheduled in 3 weeks. We look forward to seeing you again soon!   At [facility] we value your feedback. You may receive a survey about your visit today. Please share your experience as we strive to create trusting relationships with our patients to provide genuine, compassionate, quality care.  We appreciate your understanding and patience as we review any laboratory studies, imaging, and other diagnostic tests that are ordered as we care for you. We do our best to address any and all results in a timely manner. If you do not hear about test results within 1 week, please do not hesitate to contact us. If we referred you to a specialist during your visit or ordered imaging testing, contact the office if you have not been contacted to be scheduled within 1 weeks.  We also encourage the use of MyChart, which contains your medical information for your review as well. If you are not enrolled in this feature, an access code is on this after visit summary for your convenience. Thank you for allowing [place] to be involved in your care.

## 2019-07-03 DIAGNOSIS — N186 End stage renal disease: Secondary | ICD-10-CM | POA: Diagnosis not present

## 2019-07-03 DIAGNOSIS — Z992 Dependence on renal dialysis: Secondary | ICD-10-CM | POA: Diagnosis not present

## 2019-07-04 DIAGNOSIS — I10 Essential (primary) hypertension: Secondary | ICD-10-CM | POA: Diagnosis not present

## 2019-07-04 DIAGNOSIS — N186 End stage renal disease: Secondary | ICD-10-CM | POA: Diagnosis not present

## 2019-07-04 DIAGNOSIS — I4821 Permanent atrial fibrillation: Secondary | ICD-10-CM | POA: Diagnosis not present

## 2019-07-04 DIAGNOSIS — R2689 Other abnormalities of gait and mobility: Secondary | ICD-10-CM | POA: Diagnosis not present

## 2019-07-06 ENCOUNTER — Other Ambulatory Visit: Payer: Self-pay

## 2019-07-06 ENCOUNTER — Encounter (HOSPITAL_COMMUNITY): Payer: Self-pay | Admitting: Emergency Medicine

## 2019-07-06 ENCOUNTER — Emergency Department (HOSPITAL_COMMUNITY): Payer: Medicare HMO

## 2019-07-06 ENCOUNTER — Emergency Department (HOSPITAL_COMMUNITY)
Admission: EM | Admit: 2019-07-06 | Discharge: 2019-07-06 | Disposition: A | Discharge status: Home / Self Care | Payer: Medicare HMO | Attending: Emergency Medicine | Admitting: Emergency Medicine

## 2019-07-06 DIAGNOSIS — R5381 Other malaise: Secondary | ICD-10-CM | POA: Diagnosis not present

## 2019-07-06 DIAGNOSIS — Z79899 Other long term (current) drug therapy: Secondary | ICD-10-CM | POA: Insufficient documentation

## 2019-07-06 DIAGNOSIS — K228 Other specified diseases of esophagus: Secondary | ICD-10-CM

## 2019-07-06 DIAGNOSIS — R69 Illness, unspecified: Secondary | ICD-10-CM | POA: Diagnosis not present

## 2019-07-06 DIAGNOSIS — I5042 Chronic combined systolic (congestive) and diastolic (congestive) heart failure: Secondary | ICD-10-CM | POA: Insufficient documentation

## 2019-07-06 DIAGNOSIS — R1310 Dysphagia, unspecified: Secondary | ICD-10-CM | POA: Diagnosis not present

## 2019-07-06 DIAGNOSIS — I132 Hypertensive heart and chronic kidney disease with heart failure and with stage 5 chronic kidney disease, or end stage renal disease: Secondary | ICD-10-CM | POA: Insufficient documentation

## 2019-07-06 DIAGNOSIS — N186 End stage renal disease: Secondary | ICD-10-CM | POA: Insufficient documentation

## 2019-07-06 DIAGNOSIS — E1122 Type 2 diabetes mellitus with diabetic chronic kidney disease: Secondary | ICD-10-CM | POA: Diagnosis not present

## 2019-07-06 DIAGNOSIS — Z7982 Long term (current) use of aspirin: Secondary | ICD-10-CM | POA: Insufficient documentation

## 2019-07-06 DIAGNOSIS — R0989 Other specified symptoms and signs involving the circulatory and respiratory systems: Secondary | ICD-10-CM | POA: Insufficient documentation

## 2019-07-06 DIAGNOSIS — R198 Other specified symptoms and signs involving the digestive system and abdomen: Secondary | ICD-10-CM

## 2019-07-06 DIAGNOSIS — E039 Hypothyroidism, unspecified: Secondary | ICD-10-CM | POA: Insufficient documentation

## 2019-07-06 DIAGNOSIS — T18128A Food in esophagus causing other injury, initial encounter: Secondary | ICD-10-CM | POA: Diagnosis not present

## 2019-07-06 DIAGNOSIS — Z7901 Long term (current) use of anticoagulants: Secondary | ICD-10-CM | POA: Insufficient documentation

## 2019-07-06 DIAGNOSIS — Z992 Dependence on renal dialysis: Secondary | ICD-10-CM | POA: Insufficient documentation

## 2019-07-06 NOTE — ED Triage Notes (Signed)
Pt states she started she is "having trouble getting up flem in her throat" Pt states her nose bleed yesterday that was not bad. Pt states it stopped and she saw a clot string hanging out of her mouth and when she pulled it out she felt something in her throat. States she has "flet like something is stuck in my throat, I cant cough anything up".

## 2019-07-06 NOTE — ED Provider Notes (Addendum)
[facility] EMERGENCY DEPARTMENT Provider Note   CSN: [id number] Arrival date & time: 07/06/19  7564   Time seen 3:21 AM  History Chief Complaint  Patient presents with  . Dysphagia    [name] is a 79 y.o. female.  HPI   Patient states "I have slime in my throat".  Patient repeats this multiple times.  She states last night she had a small amount of bleeding from the right side of her nostril for 5 seconds.  She states later in the morning she had some dried blood in her throat.  She states she blew her nose and there was a small amount of blood that came out.  She denies seeing blood on her tongue or feeling that she has blood running down the back of her throat however she states she has to keep trying to clear her throat and she cannot get the "slime" to come out.  She states she is breathing okay and denies shortness of breath.  She is states she is able to eat and drink and she does not feel like the food gets stuck.  However she feels like she has something stuck and she points to the suprasternal notch.  She states she is unable to sleep because of the feeling that something is in there.  She denies fever.  She states she is never had this before.  Patient has a boot on her right foot, she states she has a small wound on her right foot and it is going to be evaluated in 2 days.  She also has an appointment and [facility] later this week to evaluate her hearing loss in her left ear.  Patient has end-stage renal disease and gets dialysis on Monday (later today at 1115), Wednesdays and Fridays.  PCP Doree Albee, MD   Past Medical History:  Diagnosis Date  . Anemia   . Arthritis   . Blood transfusion without reported diagnosis   . CAD (coronary artery disease)    a. 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  . Cataract   . Chronic anemia   . Chronic combined systolic and diastolic CHF (congestive heart failure) ([place])    a. 2D echo 08/2016  at [facility]: EF 50-55% with inferior wall HK, impaired LV filling, fair study.  . CKD (chronic kidney disease), stage III   . Diabetes ([place])   . Gastritis   . GERD (gastroesophageal reflux disease)   . Gout   . Headache   . HTN (hypertension)   . Hyperlipidemia   . Hypothyroidism   . Iron deficiency anemia 11/08/2016  . Normocytic anemia 10/26/2016  . NSTEMI (non-ST elevated myocardial infarction) ([place])    a. Complex admission 08/2016 - with severe hyperglycemia, AKI on CKD, severe anemia down to Hgb 6.8, acute combined CHF, troponin of 8.5, cath deferred due to renal dysfunction.  . Thyroid disease   . Wears dentures     Patient Active Problem List   Diagnosis Date Noted  . Cellulitis 07/02/2019  . Diabetic ulcer of right foot ([place]) 06/18/2019  . Acute hypoxemic respiratory failure due to COVID-19 ([place]) 05/19/2019  . Atrial fibrillation ([place]) 12/03/2018  . Encounter for therapeutic drug monitoring 12/03/2018  . Pressure injury of skin 11/24/2018  . CAD (coronary artery disease) 11/21/2018  . Acute on chronic diastolic CHF (congestive heart failure) ([place]) 11/21/2018  . Respiratory failure with hypoxia ([place]) 11/21/2018  . End-stage renal disease on hemodialysis ([place]) 11/21/2018  .  Diabetes mellitus type 2, controlled, with complications ([place]) 16/02/9603  . 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 ESRD (end stage renal disease) on dialysis ([place]) 11/12/2018  . Non-ST elevation (NSTEMI) myocardial infarction ([place]) 11/06/2018  . Polyp of transverse colon 10/08/2017  . Anemia 08/07/2017  . GI bleed 07/30/2017  . Hyperkalemia 07/30/2017  . Essential hypertension, benign 12/13/2016  . Hypothyroidism 12/13/2016  . Iron deficiency anemia 11/08/2016  . Normocytic anemia 10/26/2016  . Elevated troponin   . Aortic atherosclerosis ([place]) 10/25/2016  . Symptomatic anemia 10/04/2016  . History of non-ST elevation myocardial infarction (NSTEMI) 10/04/2016  . Mixed hyperlipidemia 10/04/2016  . Neuropathy  09/20/2016  . Chronic kidney disease, stage IV (severe) ([place]) 08/03/2015  . Fall 02/05/2015  . Hip fracture ([place]) 02/05/2015  . HTN (hypertension) 02/05/2015  . Gout 02/05/2015  . Left humeral fracture 02/05/2015  . Hyperlipidemia associated with type 2 diabetes mellitus ([place]) 05/01/2013    Past Surgical History:  Procedure Laterality Date  . BASCILIC VEIN TRANSPOSITION Right 06/12/2018   Procedure: FIRST STAGE BASCILIC VEIN TRANSPOSITION RIGHT ARM;  Surgeon: Rosetta Posner, MD;  Location: [place];  Service: Vascular;  Laterality: Right;  . BASCILIC VEIN TRANSPOSITION Right 08/07/2018   Procedure: BASCILIC VEIN TRANSPOSITION SECOND STAGE RIGHT ARM;  Surgeon: Rosetta Posner, MD;  Location: [place];  Service: Vascular;  Laterality: Right;  . CATARACT EXTRACTION     left  . COLONOSCOPY WITH PROPOFOL N/A 10/08/2016   5 mm transverse colon polyp note resected due to plavix. hemorrhoids  . CORONARY ARTERY BYPASS GRAFT N/A 11/20/2018   Procedure: CORONARY ARTERY BYPASS GRAFTING (CABG), ON PUMP, TIMES four, USING LEFT INTERNAL MAMMARY ARTERY AND ENDOSCOPICALLY HARVESTED RIGHT GREATER SAPHENOUS VEIN;  Surgeon: Melrose Nakayama, MD;  Location: [place];  Service: Open Heart Surgery;  Laterality: N/A;  . ENTEROSCOPY N/A 08/08/2017   Procedure: ENTEROSCOPY;  Surgeon: Daneil Dolin, MD;  [name];  Service: Endoscopy;  Laterality: N/A;  . ESOPHAGOGASTRODUODENOSCOPY N/A 10/06/2016   mild chroni gastritis, negative H.pylori  . ESOPHAGOGASTRODUODENOSCOPY (EGD) WITH PROPOFOL N/A 03/12/2017   mild edema/erythema of stomach, small bowel biopsy with focal villous tip lymphocytosis, ?partially developed celiac  . 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 GIVENS CAPSULE STUDY   10/08/2016   normal  . GIVENS CAPSULE STUDY N/A 10/29/2016   occasional gastric erosion, unremarkable small bowel  . LEFT HEART CATH AND CORONARY ANGIOGRAPHY N/A 11/12/2018   Procedure: LEFT HEART CATH AND CORONARY ANGIOGRAPHY;  Surgeon: Burnell Blanks, MD;  Location: [place] CV [facility];  Service: Cardiovascular;  Laterality: N/A;  . ORIF HUMERUS FRACTURE Left 02/07/2015   Procedure: OPEN REDUCTION INTERNAL FIXATION (ORIF) PROXIMAL HUMERUS FRACTURE;  Surgeon: Marybelle Killings, MD;  Location: [place];  Service: Orthopedics;  Laterality: Left;  . TEE WITHOUT CARDIOVERSION N/A 11/20/2018   Procedure: TRANSESOPHAGEAL ECHOCARDIOGRAM (TEE);  Surgeon: Melrose Nakayama, MD;  Location: [place];  Service: Open Heart Surgery;  Laterality: N/A;  . teeth extractions    . THYROID SURGERY    . TOTAL HIP ARTHROPLASTY Left 02/07/2015   Procedure: TOTAL HIP ARTHROPLASTY ANTERIOR APPROACH ;  Surgeon: Marybelle Killings, MD;  Location: [place];  Service: Orthopedics;  Laterality: Left;  . WRIST SURGERY Left      OB History  No obstetric history on file.     Family History  Problem Relation Age of Onset  . Diabetes Mother   . Asthma Mother   . Early death Mother 55       Pneumonia  . Early death Father        Killed at rodeo  . CAD Neg Hx   . GI Bleed Neg Hx     Social History   Tobacco Use  . Smoking status: Never Smoker  . Smokeless tobacco: Never Used  Substance Use Topics  . Alcohol use: No    Alcohol/week: 0.0 standard drinks  . Drug use: No  has a caretaker  Home Medications Prior to Admission medications   Medication Sig Start Date End Date Taking? Authorizing Provider  acetaminophen (TYLENOL) 325 MG tablet Take 2 tablets (650 mg total) by mouth every 6 (six) hours as needed for mild pain or headache. 11/28/18   Nani Skillern, PA-C  amiodarone (PACERONE) 200 MG tablet Take 1 tablet (200 mg total) by mouth daily. 01/28/19   Arnoldo Lenis, MD  amLODipine (NORVASC) 10 MG tablet Take  1 tablet by mouth once daily Patient taking differently: Take 10 mg by mouth daily. Jory Sims, Fri 05/12/19   Pixie Casino, MD  Ascorbic Acid (VITAMIN C) 100 MG tablet Take 1 tablet (100 mg total) by mouth daily. Patient taking differently: Take 100 mg by mouth 2 (two) times daily.  11/28/18   Nani Skillern, PA-C  aspirin EC 81 MG EC tablet Take 1 tablet (81 mg total) by mouth daily. 11/28/18   Nani Skillern, PA-C  calcium acetate (PHOSLO) 667 MG capsule Take 667 mg by mouth See admin instructions. 3 capsules with each meal and 1 with snacks daily    [provider]  cephALEXin (KEFLEX) 500 MG capsule Take 1 tablet by mouth twice a day on days you do not do dialysis. On dialysis days take 1 tablet by mouth after dialysis 07/02/19   Ailene Ards, NP  Cholecalciferol (VITAMIN D-3) 125 MCG (5000 UT) TABS Take 5,000 mg by mouth daily.     [provider]  Evolocumab (REPATHA SURECLICK) 856 MG/ML SOAJ Inject 140 mg into the skin every 14 (fourteen) days. 01/06/19   Arnoldo Lenis, MD  febuxostat (ULORIC) 40 MG tablet Take 1 tablet by mouth once daily 06/16/19   Hurshel Party C, MD  gabapentin (NEURONTIN) 300 MG capsule Take 1 capsule by mouth 2 (two) times daily. 06/12/19   [provider]  metoprolol succinate (TOPROL XL) 25 MG 24 hr tablet Take 0.5 tablets (12.5 mg total) by mouth daily. 11/28/18 11/28/19  Nani Skillern, PA-C  Multiple Vitamin (MULTIVITAMIN WITH MINERALS) TABS tablet Take 1 tablet by mouth daily.    [provider]  NP THYROID 120 MG tablet Take 1 tablet by mouth once daily 06/16/19 07/16/19  Hurshel Party C, MD  pantoprazole (PROTONIX) 40 MG tablet Take 1 tablet (40 mg total) by mouth 2 (two) times daily. 30 minutes before breakfast Patient taking differently: Take 40 mg by mouth daily before breakfast.  11/27/17   Annitta Needs, NP  warfarin (COUMADIN) 2.5 MG tablet Take 1 1/2 tablets daily or as directed Patient taking  differently: Take 2.5 mg by mouth See admin instructions. 5mg  every other day and 2.5mg  every other day alternating 01/05/19   Arnoldo Lenis, MD  gabapentin (NEURONTIN) 300 MG capsule Take 2 capsules (600 mg total) by mouth at  bedtime. 11/18/17   Mahala Menghini, PA-C  gabapentin (NEURONTIN) 300 MG capsule TAKE 2 CAPSULES BY MOUTH ONCE DAILY AT NIGHT 01/30/19   Doree Albee, MD  NP THYROID 120 MG tablet Take 120 mg by mouth daily. 08/28/18   [provider]    Allergies    Insulin glargine and Statins  Review of Systems   Review of Systems  All other systems reviewed and are negative.   Physical Exam Updated Vital Signs BP (!) 131/41 (BP Location: Right Arm)   Pulse 72   Temp 98.3 F (36.8 C) (Oral)   Resp 17   Ht 5\' 4"  (1.626 m)   Wt 74.8 kg   SpO2 99%   BMI 28.32 kg/m   Physical Exam Vitals and nursing note reviewed.  Constitutional:      Appearance: Normal appearance. She is normal weight.  HENT:     Head: Normocephalic and atraumatic.     Right Ear: External ear normal.     Left Ear: External ear normal.     Nose:     Comments: There is no blood in either nostril    Mouth/Throat:     Mouth: Mucous membranes are dry.     Comments: Patient has some brown coating on her tongue I cannot see her posterior pharynx. Eyes:     Extraocular Movements: Extraocular movements intact.     Conjunctiva/sclera: Conjunctivae normal.     Pupils: Pupils are equal, round, and reactive to light.  Cardiovascular:     Rate and Rhythm: Normal rate.  Pulmonary:     Effort: Pulmonary effort is normal. No respiratory distress.  Musculoskeletal:        General: Normal range of motion.     Cervical back: Normal range of motion.  Skin:    General: Skin is warm and dry.  Neurological:     General: No focal deficit present.     Mental Status: She is alert and oriented to person, place, and time.     Cranial Nerves: No cranial nerve deficit.  Psychiatric:        Mood and  Affect: Mood normal.        Behavior: Behavior normal.        Thought Content: Thought content normal.     ED Results / Procedures / Treatments   Labs (all labs ordered are listed, but only abnormal results are displayed) Labs Reviewed - No data to display CMP Latest Ref Rng & Units 06/24/2019 05/23/2019 05/22/2019  Glucose 70 - 99 mg/dL 100(H) 211(H) 164(H)  BUN 8 - 23 mg/dL 16 89(H) 55(H)  Creatinine 0.44 - 1.00 mg/dL 2.86(H) 7.29(H) 5.25(H)  Sodium 135 - 145 mmol/L 136 133(L) 134(L)  Potassium 3.5 - 5.1 mmol/L 3.7 4.4 3.8  Chloride 98 - 111 mmol/L 98 91(L) 92(L)  CO2 22 - 32 mmol/L 28 24 25   Calcium 8.9 - 10.3 mg/dL 8.1(L) 8.2(L) 7.9(L)  Total Protein 6.5 - 8.1 g/dL 6.1(L) 5.7(L) 5.9(L)  Total Bilirubin 0.3 - 1.2 mg/dL 0.8 0.5 0.3  Alkaline Phos 38 - 126 U/L 82 59 63  AST 15 - 41 U/L 33 15 22  ALT 0 - 44 U/L 12 20 25        EKG None  Radiology CT Soft Tissue Neck Wo Contrast  Result Date: 07/06/2019 CLINICAL DATA:  79 year old female with difficulty swallowing, globus sensation following epistaxis yesterday. Renal insufficiency EXAM: CT NECK WITHOUT CONTRAST TECHNIQUE: Multidetector CT imaging of the neck was performed  following the standard protocol without intravenous contrast. COMPARISON:  Chest CT 11/07/2018. Head CT 03/08/2017. FINDINGS: Pharynx and larynx: Laryngeal contours in the epiglottis appear normal. There is mild motion artifact in the hypopharynx. Pharyngeal soft tissue contours are within normal limits. Asymmetric calcification of the left palatine tonsil is likely postinflammatory (series 2, image 37). Negative nasopharynx. Bilateral parapharyngeal and the retropharyngeal spaces appear normal. Salivary glands: Negative sublingual space. Noncontrast submandibular glands appear symmetric and within normal limits. Likewise, both parotid glands appear normal. Thyroid: Diminutive, negative. There are small surgical clips adjacent to both thyroid lobes. Lymph nodes:  Negative. No cervical lymphadenopathy. No focal soft tissue inflammation identified. Vascular: Vascular patency is not evaluated in the absence of IV contrast. Extensive bilateral carotid calcification worse on the right, and also involving both ICA siphons. Limited intracranial: Stable, negative. Visualized orbits: Stable, negative. Mastoids and visualized paranasal sinuses: Unremarkable nasal cavity; symmetric but mild bilateral nasal cavity mucosal thickening. Bilateral paranasal sinuses are well pneumatized, with occasional mild mucosal thickening similar to the 2018 CT. Probable small mucous retention cyst in the right sphenoid sinus is new on series 3, image 9. Bilateral tympanic cavities and mastoids are clear. Skeleton: Absent maxillary dentition. Motion artifact at the jaw. Cervical spine degeneration with flowing anterior endplate osteophytes and subsequent interbody ankylosis from C6 through the visible upper thoracic spine. Osteopenia in the spine. No acute osseous abnormality identified. Upper chest: No superior mediastinal lymphadenopathy. Partially visible asymmetric patchy but also dependent upper lung opacity on series 3, image 105. This is more pronounced on the left, but might be postinflammatory scarring as substantial left lung opacity was present on the June 2020 chest comparison. IMPRESSION: 1. Negative noncontrast CT appearance of the neck. No explanation for globus sensation or dysphagia; there are asymmetric calcifications of the left palatine tonsil but these are likely chronic and postinflammatory. 2. Partially visible upper lung opacity, but favor postinflammatory scarring given the chest CT appearance of 11/07/2018. 3. Advanced carotid calcified atherosclerosis. 4. Diffuse idiopathic skeletal hyperostosis (DISH) with flowing endplate osteophytes and interbody ankylosis from C6 through the visible upper thoracic spine. Electronically Signed   By: Genevie Ann M.D.   On: 07/06/2019 05:20     Procedures Procedures (including critical care time)  Medications Ordered in ED Medications - No data to display  ED Course  I have reviewed the triage vital signs and the nursing notes.  Pertinent labs & imaging results that were available during my care of the patient were reviewed by me and considered in my medical decision making (see chart for details).    MDM Rules/Calculators/A&P                      Patient had blood work done on January 27 and she has a significant renal insufficiency so CT cannot be done with dye.  CT soft tissue neck was done to evaluate her feeling that something is stuck in her throat.   CT does not explain her feeling that something is stuck in her throat.  She was advised to drink plenty of fluids and take Mucinex over-the-counter.  She should follow-up with ENT and gastroenterology.  Final Clinical Impression(s) / ED Diagnoses Final diagnoses:  Sensation of foreign body in esophagus    Rx / DC Orders ED Discharge Orders    None     Plan discharge  Rolland Porter, MD, Barbette Or, MD 07/06/19 [place], [place], MD 07/06/19 [phone]

## 2019-07-06 NOTE — Discharge Instructions (Addendum)
Drink plenty of fluids, take Mucinex over-the-counter to help cough up the mucus.  If it is not improving consider having a ENT evaluate your throat, you can call Dr. Deeann Saint office to get an appointment, or have gastroenterology evaluate your esophagus and make sure it is functioning properly, if you have a gastroenterologist she can have them recheck you or Dr. Laural Golden is the gastroenterologist on-call.  Return to the emergency room if you are unable to swallow or feel short of breath.

## 2019-07-07 DIAGNOSIS — I739 Peripheral vascular disease, unspecified: Secondary | ICD-10-CM | POA: Diagnosis not present

## 2019-07-07 DIAGNOSIS — R609 Edema, unspecified: Secondary | ICD-10-CM | POA: Diagnosis not present

## 2019-07-07 DIAGNOSIS — E11621 Type 2 diabetes mellitus with foot ulcer: Secondary | ICD-10-CM | POA: Diagnosis not present

## 2019-07-07 DIAGNOSIS — L97518 Non-pressure chronic ulcer of other part of right foot with other specified severity: Secondary | ICD-10-CM | POA: Diagnosis not present

## 2019-07-08 ENCOUNTER — Telehealth: Payer: Self-pay | Admitting: *Deleted

## 2019-07-08 DIAGNOSIS — Z992 Dependence on renal dialysis: Secondary | ICD-10-CM | POA: Diagnosis not present

## 2019-07-08 DIAGNOSIS — N186 End stage renal disease: Secondary | ICD-10-CM | POA: Diagnosis not present

## 2019-07-08 NOTE — Telephone Encounter (Signed)
Returned a call to the pt and she stated she had been to the hospital regarding this. She then asked if I would speak with her friend as they are now on speaker phone together, Avis Epley on [facility] form as well. Advised that if she is bleeding now she needs to seek medical assistance and she said she isn't at this time but was this morning. They said the question was do they stop the warfarin or not, advised not to without having an INR and that if needed we could check it. They stated they would continue but if it bleeds again they would go to the Hospital again. Advised that seeking medical care with a Physician is best if she starts bleeding again.

## 2019-07-08 NOTE — Telephone Encounter (Signed)
Patient called stating that she is having nose bleeds off and on for a week.

## 2019-07-09 ENCOUNTER — Telehealth: Payer: Self-pay | Admitting: Gastroenterology

## 2019-07-09 ENCOUNTER — Other Ambulatory Visit: Payer: Self-pay | Admitting: Gastroenterology

## 2019-07-09 NOTE — Telephone Encounter (Signed)
Pt said she needs a refill on her prescription that starts with P. I told her to call her pharmacy so they can fax [place] a refill request.

## 2019-07-10 DIAGNOSIS — N186 End stage renal disease: Secondary | ICD-10-CM | POA: Diagnosis not present

## 2019-07-10 DIAGNOSIS — Z992 Dependence on renal dialysis: Secondary | ICD-10-CM | POA: Diagnosis not present

## 2019-07-10 NOTE — Telephone Encounter (Signed)
Noted and it is in refill request box now.

## 2019-07-13 DIAGNOSIS — Z992 Dependence on renal dialysis: Secondary | ICD-10-CM | POA: Diagnosis not present

## 2019-07-13 DIAGNOSIS — N186 End stage renal disease: Secondary | ICD-10-CM | POA: Diagnosis not present

## 2019-07-14 ENCOUNTER — Other Ambulatory Visit: Payer: Self-pay | Admitting: Gastroenterology

## 2019-07-14 DIAGNOSIS — Z79899 Other long term (current) drug therapy: Secondary | ICD-10-CM | POA: Diagnosis not present

## 2019-07-14 DIAGNOSIS — L97519 Non-pressure chronic ulcer of other part of right foot with unspecified severity: Secondary | ICD-10-CM | POA: Diagnosis not present

## 2019-07-14 DIAGNOSIS — I2510 Atherosclerotic heart disease of native coronary artery without angina pectoris: Secondary | ICD-10-CM | POA: Diagnosis not present

## 2019-07-14 DIAGNOSIS — L89319 Pressure ulcer of right buttock, unspecified stage: Secondary | ICD-10-CM | POA: Diagnosis not present

## 2019-07-14 DIAGNOSIS — Z833 Family history of diabetes mellitus: Secondary | ICD-10-CM | POA: Diagnosis not present

## 2019-07-14 DIAGNOSIS — E11621 Type 2 diabetes mellitus with foot ulcer: Secondary | ICD-10-CM | POA: Diagnosis not present

## 2019-07-14 DIAGNOSIS — L97518 Non-pressure chronic ulcer of other part of right foot with other specified severity: Secondary | ICD-10-CM | POA: Diagnosis not present

## 2019-07-14 DIAGNOSIS — Z7982 Long term (current) use of aspirin: Secondary | ICD-10-CM | POA: Diagnosis not present

## 2019-07-14 DIAGNOSIS — L84 Corns and callosities: Secondary | ICD-10-CM | POA: Diagnosis not present

## 2019-07-14 DIAGNOSIS — Z7901 Long term (current) use of anticoagulants: Secondary | ICD-10-CM | POA: Diagnosis not present

## 2019-07-15 DIAGNOSIS — Z992 Dependence on renal dialysis: Secondary | ICD-10-CM | POA: Diagnosis not present

## 2019-07-15 DIAGNOSIS — N186 End stage renal disease: Secondary | ICD-10-CM | POA: Diagnosis not present

## 2019-07-17 DIAGNOSIS — Z992 Dependence on renal dialysis: Secondary | ICD-10-CM | POA: Diagnosis not present

## 2019-07-17 DIAGNOSIS — N186 End stage renal disease: Secondary | ICD-10-CM | POA: Diagnosis not present

## 2019-07-20 DIAGNOSIS — Z992 Dependence on renal dialysis: Secondary | ICD-10-CM | POA: Diagnosis not present

## 2019-07-20 DIAGNOSIS — N186 End stage renal disease: Secondary | ICD-10-CM | POA: Diagnosis not present

## 2019-07-21 ENCOUNTER — Ambulatory Visit (INDEPENDENT_AMBULATORY_CARE_PROVIDER_SITE_OTHER): Payer: Medicare HMO | Admitting: Internal Medicine

## 2019-07-22 ENCOUNTER — Other Ambulatory Visit (INDEPENDENT_AMBULATORY_CARE_PROVIDER_SITE_OTHER): Payer: Self-pay | Admitting: Internal Medicine

## 2019-07-22 DIAGNOSIS — N186 End stage renal disease: Secondary | ICD-10-CM | POA: Diagnosis not present

## 2019-07-22 DIAGNOSIS — Z992 Dependence on renal dialysis: Secondary | ICD-10-CM | POA: Diagnosis not present

## 2019-07-22 DIAGNOSIS — E039 Hypothyroidism, unspecified: Secondary | ICD-10-CM

## 2019-07-24 DIAGNOSIS — Z992 Dependence on renal dialysis: Secondary | ICD-10-CM | POA: Diagnosis not present

## 2019-07-24 DIAGNOSIS — N186 End stage renal disease: Secondary | ICD-10-CM | POA: Diagnosis not present

## 2019-07-26 DIAGNOSIS — N186 End stage renal disease: Secondary | ICD-10-CM | POA: Diagnosis not present

## 2019-07-26 DIAGNOSIS — Z992 Dependence on renal dialysis: Secondary | ICD-10-CM | POA: Diagnosis not present

## 2019-07-27 ENCOUNTER — Other Ambulatory Visit: Payer: Self-pay

## 2019-07-27 ENCOUNTER — Ambulatory Visit (INDEPENDENT_AMBULATORY_CARE_PROVIDER_SITE_OTHER): Payer: Medicare HMO | Admitting: *Deleted

## 2019-07-27 DIAGNOSIS — Z5181 Encounter for therapeutic drug level monitoring: Secondary | ICD-10-CM | POA: Diagnosis not present

## 2019-07-27 DIAGNOSIS — I480 Paroxysmal atrial fibrillation: Secondary | ICD-10-CM | POA: Diagnosis not present

## 2019-07-27 DIAGNOSIS — N186 End stage renal disease: Secondary | ICD-10-CM | POA: Diagnosis not present

## 2019-07-27 DIAGNOSIS — Z992 Dependence on renal dialysis: Secondary | ICD-10-CM | POA: Diagnosis not present

## 2019-07-27 LAB — POCT INR: INR: 1.5 — AB (ref 2.0–3.0)

## 2019-07-27 NOTE — Patient Instructions (Signed)
Take warfarin 2 tablet tonight and tomorrow night then increase dose to 1 1/2 tablets (7.5mg ) daily except 1 tablet (5mg ) on Tuesdays and Saturdays Recheck in 3 weeks

## 2019-07-28 ENCOUNTER — Other Ambulatory Visit: Payer: Self-pay | Admitting: Cardiology

## 2019-07-29 ENCOUNTER — Telehealth (INDEPENDENT_AMBULATORY_CARE_PROVIDER_SITE_OTHER): Payer: Self-pay

## 2019-07-29 DIAGNOSIS — N186 End stage renal disease: Secondary | ICD-10-CM | POA: Diagnosis not present

## 2019-07-29 DIAGNOSIS — Z992 Dependence on renal dialysis: Secondary | ICD-10-CM | POA: Diagnosis not present

## 2019-07-29 NOTE — Telephone Encounter (Signed)
Pt is not going to have home dialysis for a month from now. So she is battling with anxiety and depressions. Pt feels like she is giving spouse  So many problems. Pt is feeling so sad she is not see a psychiatric provider. She is scared of them; she doesn't know if she trust them.  She was tearful in voice, just felt comfortable call to speak to Azalie Harbeck, CMA to explain to see if Dr Anastasio Champion will be able to help.

## 2019-07-30 ENCOUNTER — Encounter (INDEPENDENT_AMBULATORY_CARE_PROVIDER_SITE_OTHER): Payer: Self-pay | Admitting: Internal Medicine

## 2019-07-30 ENCOUNTER — Ambulatory Visit (INDEPENDENT_AMBULATORY_CARE_PROVIDER_SITE_OTHER): Payer: Medicare HMO | Admitting: Internal Medicine

## 2019-07-30 DIAGNOSIS — F4323 Adjustment disorder with mixed anxiety and depressed mood: Secondary | ICD-10-CM | POA: Diagnosis not present

## 2019-07-30 NOTE — Progress Notes (Signed)
Metrics: Intervention Frequency ACO  Documented Smoking Status Yearly  Screened one or more times in 24 months  Cessation Counseling or  Active cessation medication Past 24 months  Past 24 months   Guideline developer: UpToDate (See UpToDate for funding source) Date Released: 2014       Wellness Office Visit  Subjective:  Patient ID: [name], female    DOB: 03-22-1941  Age: 79 y.o. MRN: [id number]  CC: This is an audio telemedicine visit with the patient who is at home and I am in my office.  I identified the patient with 2 identifiers and was able to easily recognize her voice. Her chief complaint is anxiety/situational sadness.  HPI  She has been with a partner of sorts for many years and as she goes through her illness with dialysis, it appears that she feels that she cannot trust him.  At times she has anxiety and at times she feels sad.  Her mood seems to change intermittently.  She wonders whether she can get help with this.  She is definitely not suicidal.  In fact, she is looking forward to now getting peritoneal dialysis. Past Medical History:  Diagnosis Date  . Anemia   . Arthritis   . Blood transfusion without reported diagnosis   . CAD (coronary artery disease)    a. 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  . Cataract   . Chronic anemia   . Chronic combined systolic and diastolic CHF (congestive heart failure) ([place])    a. 2D echo 08/2016 at [facility]: EF 50-55% with inferior wall HK, impaired LV filling, fair study.  . CKD (chronic kidney disease), stage III   . Diabetes ([place])   . Gastritis   . GERD (gastroesophageal reflux disease)   . Gout   . Headache   . HTN (hypertension)   . Hyperlipidemia   . Hypothyroidism   . Iron deficiency anemia 11/08/2016  . Normocytic anemia 10/26/2016  . NSTEMI (non-ST elevated myocardial infarction) ([place])    a. Complex admission 08/2016 - with severe hyperglycemia, AKI on CKD, severe anemia down to Hgb  6.8, acute combined CHF, troponin of 8.5, cath deferred due to renal dysfunction.  . Thyroid disease   . Wears dentures       Family History  Problem Relation Age of Onset  . Diabetes Mother   . Asthma Mother   . Early death Mother 40       Pneumonia  . Early death Father        Killed at rode[place]  . CAD Neg Hx   . GI Bleed Neg Hx     Social History   Social History Narrative   Lives with significant other/partner [name]   He is her caregiver   Moved from [facility]      Her Son [name] (lives In [place]) -- ph # [phone] ( would like to be called about care issues)   Social History   Tobacco Use  . Smoking status: Never Smoker  . Smokeless tobacco: Never Used  Substance Use Topics  . Alcohol use: No    Alcohol/week: 0.0 standard drinks    Current Meds  Medication Sig  . acetaminophen (TYLENOL) 325 MG tablet Take 2 tablets (650 mg total) by mouth every 6 (six) hours as needed for mild pain or headache.  Marland Kitchen amiodarone (PACERONE) 200 MG tablet Take 1 tablet (200 mg total) by mouth daily.  Marland Kitchen amLODipine (NORVASC) 10 MG tablet Take  1 tablet by mouth once daily (Patient taking differently: Take 10 mg by mouth daily. Mon, Wed, Fri)  . aspirin EC 81 MG EC tablet Take 1 tablet (81 mg total) by mouth daily.  . calcium acetate (PHOSLO) 667 MG capsule Take 667 mg by mouth See admin instructions. 3 capsules with each meal and 1 with snacks daily  . Cholecalciferol (VITAMIN D-3) 125 MCG (5000 UT) TABS Take 5,000 mg by mouth daily.   . Evolocumab (REPATHA SURECLICK) 706 MG/ML SOAJ Inject 140 mg into the skin every 14 (fourteen) days.  . febuxostat (ULORIC) 40 MG tablet Take 1 tablet by mouth once daily  . gabapentin (NEURONTIN) 300 MG capsule Take 1 capsule by mouth 2 (two) times daily.  . metoprolol succinate (TOPROL XL) 25 MG 24 hr tablet Take 0.5 tablets (12.5 mg total) by mouth daily.  . Multiple Vitamin (MULTIVITAMIN WITH MINERALS) TABS tablet Take 1 tablet by mouth daily.  . NP  THYROID 120 MG tablet Take 1 tablet by mouth once daily  . ondansetron (ZOFRAN) 4 MG tablet TAKE 1 TABLET BY MOUTH EVERY 8 HOURS AS NEEDED FOR NAUSEA AND VOMITING  . pantoprazole (PROTONIX) 40 MG tablet TAKE 1 TABLET BY MOUTH TWICE DAILY 30 MINUTES BEFORE BREAKFAST  . warfarin (COUMADIN) 2.5 MG tablet TAKE 1 & 1/2 (ONE & ONE-HALF) TABLETS DAILY EXCEPT 1 TABLET ON TUESDAYS AND SATURDAYS OR AS DIRECTED      Objective:   Today's Vitals: There were no vitals taken for this visit. Vitals with BMI 07/30/2019 07/06/2019 07/02/2019  Height (No Data) 5\' 4"  5\' 4"   Weight (No Data) 165 lbs 165 lbs  BMI - 23.76 28.31  Systolic (No Data) 517 616  Diastolic (No Data) 41 52  Pulse - 72 -     Physical Exam   Her speech is normal on the phone and she appears to be alert and orientated.    Assessment   1. Situational mixed anxiety and depressive disorder       Tests ordered Orders Placed This Encounter  Procedures  . Ambulatory referral to Psychology     Plan: 1. I will refer her for therapy rather than medications at this point in time.  I do not think she has clinical depression.  It seems to me the she and her partner require counseling rather than medications.  She is agreeable to referral to a therapist. 2. Follow-up as scheduled. 3. This phone call lasted 9 minutes.   No orders of the defined types were placed in this encounter.   Doree Albee, MD

## 2019-07-31 DIAGNOSIS — Z992 Dependence on renal dialysis: Secondary | ICD-10-CM | POA: Diagnosis not present

## 2019-07-31 DIAGNOSIS — N186 End stage renal disease: Secondary | ICD-10-CM | POA: Diagnosis not present

## 2019-08-01 ENCOUNTER — Ambulatory Visit (INDEPENDENT_AMBULATORY_CARE_PROVIDER_SITE_OTHER)
Admission: EM | Admit: 2019-08-01 | Discharge: 2019-08-01 | Disposition: A | Discharge status: Home / Self Care | Payer: Medicare HMO | Source: Home / Self Care

## 2019-08-01 ENCOUNTER — Emergency Department (HOSPITAL_COMMUNITY)
Admission: EM | Admit: 2019-08-01 | Discharge: 2019-08-01 | Disposition: A | Discharge status: Home / Self Care | Payer: Medicare HMO | Source: Home / Self Care | Attending: Emergency Medicine | Admitting: Emergency Medicine

## 2019-08-01 ENCOUNTER — Emergency Department (HOSPITAL_COMMUNITY): Payer: Medicare HMO

## 2019-08-01 ENCOUNTER — Encounter (HOSPITAL_COMMUNITY): Payer: Self-pay | Admitting: *Deleted

## 2019-08-01 ENCOUNTER — Other Ambulatory Visit: Payer: Self-pay

## 2019-08-01 DIAGNOSIS — Y929 Unspecified place or not applicable: Secondary | ICD-10-CM | POA: Insufficient documentation

## 2019-08-01 DIAGNOSIS — Z7401 Bed confinement status: Secondary | ICD-10-CM | POA: Diagnosis not present

## 2019-08-01 DIAGNOSIS — I480 Paroxysmal atrial fibrillation: Secondary | ICD-10-CM | POA: Diagnosis present

## 2019-08-01 DIAGNOSIS — I491 Atrial premature depolarization: Secondary | ICD-10-CM | POA: Diagnosis not present

## 2019-08-01 DIAGNOSIS — S22000G Wedge compression fracture of unspecified thoracic vertebra, subsequent encounter for fracture with delayed healing: Secondary | ICD-10-CM | POA: Diagnosis not present

## 2019-08-01 DIAGNOSIS — Z79899 Other long term (current) drug therapy: Secondary | ICD-10-CM | POA: Insufficient documentation

## 2019-08-01 DIAGNOSIS — E782 Mixed hyperlipidemia: Secondary | ICD-10-CM | POA: Diagnosis present

## 2019-08-01 DIAGNOSIS — S22069A Unspecified fracture of T7-T8 vertebra, initial encounter for closed fracture: Secondary | ICD-10-CM | POA: Diagnosis not present

## 2019-08-01 DIAGNOSIS — I340 Nonrheumatic mitral (valve) insufficiency: Secondary | ICD-10-CM | POA: Diagnosis not present

## 2019-08-01 DIAGNOSIS — Y92009 Unspecified place in unspecified non-institutional (private) residence as the place of occurrence of the external cause: Secondary | ICD-10-CM | POA: Diagnosis not present

## 2019-08-01 DIAGNOSIS — I2510 Atherosclerotic heart disease of native coronary artery without angina pectoris: Secondary | ICD-10-CM | POA: Diagnosis present

## 2019-08-01 DIAGNOSIS — S299XXA Unspecified injury of thorax, initial encounter: Secondary | ICD-10-CM | POA: Diagnosis not present

## 2019-08-01 DIAGNOSIS — R404 Transient alteration of awareness: Secondary | ICD-10-CM | POA: Diagnosis not present

## 2019-08-01 DIAGNOSIS — Y998 Other external cause status: Secondary | ICD-10-CM | POA: Insufficient documentation

## 2019-08-01 DIAGNOSIS — N2581 Secondary hyperparathyroidism of renal origin: Secondary | ICD-10-CM | POA: Diagnosis present

## 2019-08-01 DIAGNOSIS — W01198A Fall on same level from slipping, tripping and stumbling with subsequent striking against other object, initial encounter: Secondary | ICD-10-CM | POA: Insufficient documentation

## 2019-08-01 DIAGNOSIS — E1165 Type 2 diabetes mellitus with hyperglycemia: Secondary | ICD-10-CM | POA: Diagnosis not present

## 2019-08-01 DIAGNOSIS — R4182 Altered mental status, unspecified: Secondary | ICD-10-CM | POA: Diagnosis not present

## 2019-08-01 DIAGNOSIS — S22000K Wedge compression fracture of unspecified thoracic vertebra, subsequent encounter for fracture with nonunion: Secondary | ICD-10-CM | POA: Diagnosis not present

## 2019-08-01 DIAGNOSIS — R1311 Dysphagia, oral phase: Secondary | ICD-10-CM | POA: Diagnosis not present

## 2019-08-01 DIAGNOSIS — M546 Pain in thoracic spine: Secondary | ICD-10-CM

## 2019-08-01 DIAGNOSIS — E872 Acidosis: Secondary | ICD-10-CM | POA: Diagnosis present

## 2019-08-01 DIAGNOSIS — R0902 Hypoxemia: Secondary | ICD-10-CM | POA: Diagnosis not present

## 2019-08-01 DIAGNOSIS — R61 Generalized hyperhidrosis: Secondary | ICD-10-CM | POA: Diagnosis not present

## 2019-08-01 DIAGNOSIS — S22060A Wedge compression fracture of T7-T8 vertebra, initial encounter for closed fracture: Secondary | ICD-10-CM

## 2019-08-01 DIAGNOSIS — N189 Chronic kidney disease, unspecified: Secondary | ICD-10-CM | POA: Diagnosis not present

## 2019-08-01 DIAGNOSIS — I5032 Chronic diastolic (congestive) heart failure: Secondary | ICD-10-CM | POA: Diagnosis not present

## 2019-08-01 DIAGNOSIS — N186 End stage renal disease: Secondary | ICD-10-CM | POA: Diagnosis present

## 2019-08-01 DIAGNOSIS — E039 Hypothyroidism, unspecified: Secondary | ICD-10-CM | POA: Diagnosis present

## 2019-08-01 DIAGNOSIS — Z7901 Long term (current) use of anticoagulants: Secondary | ICD-10-CM | POA: Diagnosis not present

## 2019-08-01 DIAGNOSIS — E1121 Type 2 diabetes mellitus with diabetic nephropathy: Secondary | ICD-10-CM | POA: Diagnosis not present

## 2019-08-01 DIAGNOSIS — Z888 Allergy status to other drugs, medicaments and biological substances status: Secondary | ICD-10-CM | POA: Diagnosis not present

## 2019-08-01 DIAGNOSIS — R4189 Other symptoms and signs involving cognitive functions and awareness: Secondary | ICD-10-CM | POA: Diagnosis not present

## 2019-08-01 DIAGNOSIS — I252 Old myocardial infarction: Secondary | ICD-10-CM | POA: Diagnosis not present

## 2019-08-01 DIAGNOSIS — R2681 Unsteadiness on feet: Secondary | ICD-10-CM | POA: Diagnosis not present

## 2019-08-01 DIAGNOSIS — I959 Hypotension, unspecified: Secondary | ICD-10-CM | POA: Insufficient documentation

## 2019-08-01 DIAGNOSIS — J9601 Acute respiratory failure with hypoxia: Secondary | ICD-10-CM | POA: Diagnosis not present

## 2019-08-01 DIAGNOSIS — W19XXXA Unspecified fall, initial encounter: Secondary | ICD-10-CM | POA: Diagnosis not present

## 2019-08-01 DIAGNOSIS — I25119 Atherosclerotic heart disease of native coronary artery with unspecified angina pectoris: Secondary | ICD-10-CM | POA: Diagnosis not present

## 2019-08-01 DIAGNOSIS — I4821 Permanent atrial fibrillation: Secondary | ICD-10-CM | POA: Diagnosis not present

## 2019-08-01 DIAGNOSIS — Z20822 Contact with and (suspected) exposure to covid-19: Secondary | ICD-10-CM | POA: Diagnosis present

## 2019-08-01 DIAGNOSIS — E875 Hyperkalemia: Secondary | ICD-10-CM | POA: Diagnosis present

## 2019-08-01 DIAGNOSIS — T50904A Poisoning by unspecified drugs, medicaments and biological substances, undetermined, initial encounter: Secondary | ICD-10-CM | POA: Diagnosis not present

## 2019-08-01 DIAGNOSIS — I361 Nonrheumatic tricuspid (valve) insufficiency: Secondary | ICD-10-CM | POA: Diagnosis not present

## 2019-08-01 DIAGNOSIS — M5489 Other dorsalgia: Secondary | ICD-10-CM | POA: Diagnosis not present

## 2019-08-01 DIAGNOSIS — I5033 Acute on chronic diastolic (congestive) heart failure: Secondary | ICD-10-CM | POA: Diagnosis not present

## 2019-08-01 DIAGNOSIS — T402X5A Adverse effect of other opioids, initial encounter: Secondary | ICD-10-CM | POA: Diagnosis present

## 2019-08-01 DIAGNOSIS — E1129 Type 2 diabetes mellitus with other diabetic kidney complication: Secondary | ICD-10-CM | POA: Diagnosis not present

## 2019-08-01 DIAGNOSIS — R001 Bradycardia, unspecified: Secondary | ICD-10-CM | POA: Diagnosis present

## 2019-08-01 DIAGNOSIS — M6281 Muscle weakness (generalized): Secondary | ICD-10-CM | POA: Diagnosis not present

## 2019-08-01 DIAGNOSIS — I132 Hypertensive heart and chronic kidney disease with heart failure and with stage 5 chronic kidney disease, or end stage renal disease: Secondary | ICD-10-CM | POA: Diagnosis present

## 2019-08-01 DIAGNOSIS — L89312 Pressure ulcer of right buttock, stage 2: Secondary | ICD-10-CM | POA: Diagnosis not present

## 2019-08-01 DIAGNOSIS — S22060D Wedge compression fracture of T7-T8 vertebra, subsequent encounter for fracture with routine healing: Secondary | ICD-10-CM | POA: Diagnosis not present

## 2019-08-01 DIAGNOSIS — I10 Essential (primary) hypertension: Secondary | ICD-10-CM | POA: Diagnosis not present

## 2019-08-01 DIAGNOSIS — R278 Other lack of coordination: Secondary | ICD-10-CM | POA: Diagnosis not present

## 2019-08-01 DIAGNOSIS — S22050A Wedge compression fracture of T5-T6 vertebra, initial encounter for closed fracture: Secondary | ICD-10-CM

## 2019-08-01 DIAGNOSIS — J90 Pleural effusion, not elsewhere classified: Secondary | ICD-10-CM | POA: Diagnosis not present

## 2019-08-01 DIAGNOSIS — E1122 Type 2 diabetes mellitus with diabetic chronic kidney disease: Secondary | ICD-10-CM | POA: Diagnosis present

## 2019-08-01 DIAGNOSIS — W1830XA Fall on same level, unspecified, initial encounter: Secondary | ICD-10-CM | POA: Diagnosis present

## 2019-08-01 DIAGNOSIS — Z7982 Long term (current) use of aspirin: Secondary | ICD-10-CM | POA: Insufficient documentation

## 2019-08-01 DIAGNOSIS — E118 Type 2 diabetes mellitus with unspecified complications: Secondary | ICD-10-CM | POA: Diagnosis not present

## 2019-08-01 DIAGNOSIS — R52 Pain, unspecified: Secondary | ICD-10-CM | POA: Diagnosis not present

## 2019-08-01 DIAGNOSIS — R2689 Other abnormalities of gait and mobility: Secondary | ICD-10-CM | POA: Diagnosis not present

## 2019-08-01 DIAGNOSIS — R41841 Cognitive communication deficit: Secondary | ICD-10-CM | POA: Diagnosis not present

## 2019-08-01 DIAGNOSIS — Z8616 Personal history of COVID-19: Secondary | ICD-10-CM | POA: Diagnosis not present

## 2019-08-01 DIAGNOSIS — Z951 Presence of aortocoronary bypass graft: Secondary | ICD-10-CM | POA: Diagnosis not present

## 2019-08-01 DIAGNOSIS — K219 Gastro-esophageal reflux disease without esophagitis: Secondary | ICD-10-CM | POA: Diagnosis present

## 2019-08-01 DIAGNOSIS — I129 Hypertensive chronic kidney disease with stage 1 through stage 4 chronic kidney disease, or unspecified chronic kidney disease: Secondary | ICD-10-CM | POA: Diagnosis not present

## 2019-08-01 DIAGNOSIS — Z96642 Presence of left artificial hip joint: Secondary | ICD-10-CM | POA: Insufficient documentation

## 2019-08-01 DIAGNOSIS — Y9389 Activity, other specified: Secondary | ICD-10-CM | POA: Insufficient documentation

## 2019-08-01 DIAGNOSIS — L89302 Pressure ulcer of unspecified buttock, stage 2: Secondary | ICD-10-CM | POA: Diagnosis present

## 2019-08-01 DIAGNOSIS — M4854XA Collapsed vertebra, not elsewhere classified, thoracic region, initial encounter for fracture: Secondary | ICD-10-CM | POA: Diagnosis present

## 2019-08-01 DIAGNOSIS — M1990 Unspecified osteoarthritis, unspecified site: Secondary | ICD-10-CM | POA: Insufficient documentation

## 2019-08-01 DIAGNOSIS — I5043 Acute on chronic combined systolic (congestive) and diastolic (congestive) heart failure: Secondary | ICD-10-CM | POA: Diagnosis present

## 2019-08-01 DIAGNOSIS — G92 Toxic encephalopathy: Secondary | ICD-10-CM | POA: Diagnosis present

## 2019-08-01 DIAGNOSIS — Z992 Dependence on renal dialysis: Secondary | ICD-10-CM | POA: Diagnosis not present

## 2019-08-01 DIAGNOSIS — E1140 Type 2 diabetes mellitus with diabetic neuropathy, unspecified: Secondary | ICD-10-CM | POA: Insufficient documentation

## 2019-08-01 DIAGNOSIS — I4891 Unspecified atrial fibrillation: Secondary | ICD-10-CM | POA: Diagnosis not present

## 2019-08-01 LAB — CBC WITH DIFFERENTIAL/PLATELET
Abs Immature Granulocytes: 0.1 10*3/uL — ABNORMAL HIGH (ref 0.00–0.07)
Basophils Absolute: 0.1 10*3/uL (ref 0.0–0.1)
Basophils Relative: 0 %
Eosinophils Absolute: 0.2 10*3/uL (ref 0.0–0.5)
Eosinophils Relative: 1 %
HCT: 32.3 % — ABNORMAL LOW (ref 36.0–46.0)
Hemoglobin: 9.4 g/dL — ABNORMAL LOW (ref 12.0–15.0)
Immature Granulocytes: 1 %
Lymphocytes Relative: 6 %
Lymphs Abs: 0.8 10*3/uL (ref 0.7–4.0)
MCH: 31.2 pg (ref 26.0–34.0)
MCHC: 29.1 g/dL — ABNORMAL LOW (ref 30.0–36.0)
MCV: 107.3 fL — ABNORMAL HIGH (ref 80.0–100.0)
Monocytes Absolute: 0.7 10*3/uL (ref 0.1–1.0)
Monocytes Relative: 5 %
Neutro Abs: 12.1 10*3/uL — ABNORMAL HIGH (ref 1.7–7.7)
Neutrophils Relative %: 87 %
Platelets: 224 10*3/uL (ref 150–400)
RBC: 3.01 MIL/uL — ABNORMAL LOW (ref 3.87–5.11)
RDW: 20.1 % — ABNORMAL HIGH (ref 11.5–15.5)
WBC: 13.9 10*3/uL — ABNORMAL HIGH (ref 4.0–10.5)
nRBC: 0 % (ref 0.0–0.2)

## 2019-08-01 LAB — BASIC METABOLIC PANEL
Anion gap: 13 (ref 5–15)
BUN: 16 mg/dL (ref 8–23)
CO2: 26 mmol/L (ref 22–32)
Calcium: 8.6 mg/dL — ABNORMAL LOW (ref 8.9–10.3)
Chloride: 98 mmol/L (ref 98–111)
Creatinine, Ser: 3.93 mg/dL — ABNORMAL HIGH (ref 0.44–1.00)
GFR calc Af Amer: 12 mL/min — ABNORMAL LOW (ref >60)
GFR calc non Af Amer: 10 mL/min — ABNORMAL LOW (ref >60)
Glucose, Bld: 151 mg/dL — ABNORMAL HIGH (ref 70–99)
Potassium: 4.1 mmol/L (ref 3.5–5.1)
Sodium: 137 mmol/L (ref 135–145)

## 2019-08-01 LAB — PROTIME-INR
INR: 1.3 — ABNORMAL HIGH (ref 0.8–1.2)
Prothrombin Time: 16.4 seconds — ABNORMAL HIGH (ref 11.4–15.2)

## 2019-08-01 MED ORDER — HYDROCODONE-ACETAMINOPHEN 5-325 MG PO TABS: 1.0000 | ORAL_TABLET | Freq: Four times a day (QID) | ORAL | 0 refills | Status: DC | PRN

## 2019-08-01 MED ORDER — LIDOCAINE 5 % EX PTCH
2.0000 | MEDICATED_PATCH | Freq: Once | CUTANEOUS | Status: DC
  Administered 2019-08-01: 2 via TRANSDERMAL
  Filled 2019-08-01: qty 2

## 2019-08-01 MED ORDER — HYDROCODONE-ACETAMINOPHEN 5-325 MG PO TABS
1.0000 | ORAL_TABLET | Freq: Once | ORAL | Status: AC
  Administered 2019-08-01: 15:00:00 1 via ORAL
  Filled 2019-08-01: qty 1

## 2019-08-01 MED ORDER — ACETAMINOPHEN 325 MG PO TABS
650.0000 mg | ORAL_TABLET | Freq: Once | ORAL | Status: AC
  Administered 2019-08-01: 650 mg via ORAL
  Filled 2019-08-01: qty 2

## 2019-08-01 NOTE — ED Notes (Signed)
Pt began feeling nauseated and pain worsening, pt looks unwell. bp rechecked and is 74/39, ems called to transport to ED

## 2019-08-01 NOTE — Discharge Instructions (Signed)
Patient was waiting to be seen.  Husband came out of room stating patient was in pain and was nauseous.  Upon entering room patient appeared pale and uncomfortable.  Blood pressure was 92/50 initially, rechecked, and BP had dropped to 74/39.  Heart rate was 55 bpm.  Notified RN to call EMS.  Attempted to start IV, unsuccessful.  Oxygen saturations dropped to 88% and then improved to 90% with deep breaths.  EMS arrived and transported to [facility] ED in stable condition.

## 2019-08-01 NOTE — ED Triage Notes (Signed)
Patient arrived to the ED via [facility] due to hypotension at her urgent care in which she was there originally due to a fall at home today.  Patient states she has to be evaluated of her fall before she goes to dialysis which is due Monday.

## 2019-08-01 NOTE — ED Notes (Signed)
Patient able to ambulate out of room, down hall using walker. No difficulty noted or voiced by patient.

## 2019-08-01 NOTE — Discharge Instructions (Signed)
  There were compression fractures noted in the thoracic vertebra. This should be followed up on by neurosurgery or spine specialist. Call to make an appointment.  Acetaminophen: May take acetaminophen (generic for Tylenol), as needed, for pain. Your daily total maximum amount of acetaminophen from all sources should be limited to 4000mg /day for persons without liver problems, or 2000mg /day for those with liver problems. Vicodin: May take Vicodin (hydrocodone-acetaminophen) as needed for severe pain.   Do not drive or perform other dangerous activities while taking this medication as it can cause drowsiness as well as changes in reaction time and judgement.   Please note that each pill of Vicodin contains 325 mg of acetaminophen (generic for Tylenol) and the above dosage limits apply.  Your INR was lower than it should be. Please follow-up with your primary care provider on this matter.

## 2019-08-01 NOTE — ED Provider Notes (Addendum)
[facility] EMERGENCY DEPARTMENT Provider Note   CSN: [id number] Arrival date & time: 08/01/19  1159     History Chief Complaint  Patient presents with  . Hypotension    [name] is a 79 y.o. female.  HPI      [name] is a 79 y.o. female, with a history of anemia, arthritis, CAD, CABG, DM, end-stage renal disease on dialysis, HTN, presenting to the ED via EMS from urgent care.  EMS reports they were called because patient reportedly had an episode of hypotension with systolic blood pressure in the 70s.  She also became pale and diaphoretic.  This resolved and did not recur. Patient went to the urgent care after sustaining a fall a couple hours prior to arrival.  She states she was wearing slippery socks on hardwood floor and was not using her walker.  She states she fell backward and struck her back on the floor. She went to the urgent care because she states she has dialysis scheduled for tomorrow and after a fall she is required to "get checked out" before returning to dialysis. Patient is complaining of pain to her upper back, moderate at rest, severe with movement, nonradiating. She is on a Monday, Wednesday, Friday dialysis schedule.  She had a complete dialysis session yesterday. Denies head injury, LOC, neck pain, other back pain, chest pain, shortness of breath, abdominal pain, recent illness, cough, N/V/D, weakness, numbness, joint pain, or any other complaints.   Past Medical History:  Diagnosis Date  . Anemia   . Arthritis   . Blood transfusion without reported diagnosis   . CAD (coronary artery disease)    a. 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  . Cataract   . Chronic anemia   . Chronic combined systolic and diastolic CHF (congestive heart failure) ([place])    a. 2D echo 08/2016 at [facility]: EF 50-55% with inferior wall HK, impaired LV filling, fair study.  . CKD (chronic kidney disease), stage III   . Diabetes ([place])   .  Gastritis   . GERD (gastroesophageal reflux disease)   . Gout   . Headache   . HTN (hypertension)   . Hyperlipidemia   . Hypothyroidism   . Iron deficiency anemia 11/08/2016  . Normocytic anemia 10/26/2016  . NSTEMI (non-ST elevated myocardial infarction) ([place])    a. Complex admission 08/2016 - with severe hyperglycemia, AKI on CKD, severe anemia down to Hgb 6.8, acute combined CHF, troponin of 8.5, cath deferred due to renal dysfunction.  . Thyroid disease   . Wears dentures     Patient Active Problem List   Diagnosis Date Noted  . Cellulitis 07/02/2019  . Diabetic ulcer of right foot ([place]) 06/18/2019  . Acute hypoxemic respiratory failure due to COVID-19 ([place]) 05/19/2019  . Atrial fibrillation ([place]) 12/03/2018  . Encounter for therapeutic drug monitoring 12/03/2018  . Pressure injury of skin 11/24/2018  . CAD (coronary artery disease) 11/21/2018  . Acute on chronic diastolic CHF (congestive heart failure) ([place]) 11/21/2018  . Respiratory failure with hypoxia ([place]) 11/21/2018  . End-stage renal disease on hemodialysis ([place]) 11/21/2018  . Diabetes mellitus type 2, controlled, with complications ([place]) 75/64/3329  . 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 ESRD (end stage renal disease) on dialysis ([place]) 11/12/2018  . Non-ST elevation (NSTEMI) myocardial infarction ([employer]) 11/06/2018  . Polyp of transverse colon 10/08/2017  . Anemia 08/07/2017  . GI bleed 07/30/2017  . Hyperkalemia 07/30/2017  . Essential  hypertension, benign 12/13/2016  . Hypothyroidism 12/13/2016  . Iron deficiency anemia 11/08/2016  . Normocytic anemia 10/26/2016  . Elevated troponin   . Aortic atherosclerosis ([place]) 10/25/2016  . Symptomatic anemia 10/04/2016  . History of non-ST elevation myocardial infarction (NSTEMI) 10/04/2016  . Mixed hyperlipidemia 10/04/2016  . Neuropathy 09/20/2016  . Chronic kidney disease, stage IV (severe) ([place]) 08/03/2015  . Fall 02/05/2015  . Hip fracture ([place]) 02/05/2015  . HTN  (hypertension) 02/05/2015  . Gout 02/05/2015  . Left humeral fracture 02/05/2015  . Hyperlipidemia associated with type 2 diabetes mellitus ([place]) 05/01/2013    Past Surgical History:  Procedure Laterality Date  . BASCILIC VEIN TRANSPOSITION Right 06/12/2018   Procedure: FIRST STAGE BASCILIC VEIN TRANSPOSITION RIGHT ARM;  Surgeon: Rosetta Posner, MD;  Location: [place];  Service: Vascular;  Laterality: Right;  . BASCILIC VEIN TRANSPOSITION Right 08/07/2018   Procedure: BASCILIC VEIN TRANSPOSITION SECOND STAGE RIGHT ARM;  Surgeon: Rosetta Posner, MD;  Location: [place];  Service: Vascular;  Laterality: Right;  . CATARACT EXTRACTION     left  . COLONOSCOPY WITH PROPOFOL N/A 10/08/2016   5 mm transverse colon polyp note resected due to plavix. hemorrhoids  . CORONARY ARTERY BYPASS GRAFT N/A 11/20/2018   Procedure: CORONARY ARTERY BYPASS GRAFTING (CABG), ON PUMP, TIMES four, USING LEFT INTERNAL MAMMARY ARTERY AND ENDOSCOPICALLY HARVESTED RIGHT GREATER SAPHENOUS VEIN;  Surgeon: Melrose Nakayama, MD;  Location: [place];  Service: Open Heart Surgery;  Laterality: N/A;  . ENTEROSCOPY N/A 08/08/2017   Procedure: ENTEROSCOPY;  Surgeon: Daneil Dolin, MD;  [name];  Service: Endoscopy;  Laterality: N/A;  . ESOPHAGOGASTRODUODENOSCOPY N/A 10/06/2016   mild chroni gastritis, negative H.pylori  . ESOPHAGOGASTRODUODENOSCOPY (EGD) WITH PROPOFOL N/A 03/12/2017   mild edema/erythema of stomach, small bowel biopsy with focal villous tip lymphocytosis, ?partially developed celiac  . 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 GIVENS CAPSULE STUDY  10/08/2016   normal  . GIVENS CAPSULE STUDY N/A 10/29/2016   occasional gastric erosion, unremarkable small bowel  . LEFT HEART CATH  AND CORONARY ANGIOGRAPHY N/A 11/12/2018   Procedure: LEFT HEART CATH AND CORONARY ANGIOGRAPHY;  Surgeon: Burnell Blanks, MD;  Location: [place] [facility];  Service: Cardiovascular;  Laterality: N/A;  . ORIF HUMERUS FRACTURE Left 02/07/2015   Procedure: OPEN REDUCTION INTERNAL FIXATION (ORIF) PROXIMAL HUMERUS FRACTURE;  Surgeon: Marybelle Killings, MD;  Location: [place];  Service: Orthopedics;  Laterality: Left;  . TEE WITHOUT CARDIOVERSION N/A 11/20/2018   Procedure: TRANSESOPHAGEAL ECHOCARDIOGRAM (TEE);  Surgeon: Melrose Nakayama, MD;  Location: [place];  Service: Open Heart Surgery;  Laterality: N/A;  . teeth extractions    . THYROID SURGERY    . TOTAL HIP ARTHROPLASTY Left 02/07/2015   Procedure: TOTAL HIP ARTHROPLASTY ANTERIOR APPROACH ;  Surgeon: Marybelle Killings, MD;  Location: [place];  Service: Orthopedics;  Laterality: Left;  . WRIST SURGERY Left      OB History   No obstetric history on file.     Family History  Problem Relation Age of Onset  . Diabetes Mother   . Asthma Mother   . Early death Mother 73       Pneumonia  . Early death Father        Killed at rodeo  . CAD  Neg Hx   . GI Bleed Neg Hx     Social History   Tobacco Use  . Smoking status: Never Smoker  . Smokeless tobacco: Never Used  Substance Use Topics  . Alcohol use: No    Alcohol/week: 0.0 standard drinks  . Drug use: No    Home Medications Prior to Admission medications   Medication Sig Start Date End Date Taking? Authorizing Provider  acetaminophen (TYLENOL) 325 MG tablet Take 2 tablets (650 mg total) by mouth every 6 (six) hours as needed for mild pain or headache. 11/28/18  Yes Lars Pinks M, PA-C  amiodarone (PACERONE) 200 MG tablet Take 1 tablet (200 mg total) by mouth daily. 01/28/19  Yes Branch, Alphonse Guild, MD  amLODipine (NORVASC) 10 MG tablet Take 1 tablet by mouth once daily Patient taking differently: Take 10 mg by mouth daily. Jory Sims, Fri 05/12/19  Yes Pixie Casino, MD    aspirin EC 81 MG EC tablet Take 1 tablet (81 mg total) by mouth daily. 11/28/18  Yes Lars Pinks M, PA-C  calcium acetate (PHOSLO) 667 MG capsule Take 667 mg by mouth See admin instructions. 3 capsules with each meal and 1 with snacks daily   Yes [provider]  Cholecalciferol (VITAMIN D-3) 125 MCG (5000 UT) TABS Take 5,000 mg by mouth daily.    Yes [provider]  Evolocumab (REPATHA SURECLICK) 161 MG/ML SOAJ Inject 140 mg into the skin every 14 (fourteen) days. 01/06/19  Yes BranchAlphonse Guild, MD  febuxostat (ULORIC) 40 MG tablet Take 1 tablet by mouth once daily 06/16/19  Yes Gosrani, Nimish C, MD  gabapentin (NEURONTIN) 300 MG capsule Take 1 capsule by mouth 2 (two) times daily. 06/12/19  Yes [provider]  metoprolol succinate (TOPROL XL) 25 MG 24 hr tablet Take 0.5 tablets (12.5 mg total) by mouth daily. 11/28/18 11/28/19 Yes Lars Pinks M, PA-C  Multiple Vitamin (MULTIVITAMIN WITH MINERALS) TABS tablet Take 1 tablet by mouth daily.   Yes [provider]  NP THYROID 120 MG tablet Take 1 tablet by mouth once daily 07/22/19 08/21/19 Yes Gosrani, Nimish C, MD  ondansetron (ZOFRAN) 4 MG tablet TAKE 1 TABLET BY MOUTH EVERY 8 HOURS AS NEEDED FOR NAUSEA AND VOMITING Patient taking differently: Take 4 mg by mouth every 8 (eight) hours as needed for nausea or vomiting.  07/14/19  Yes Mahala Menghini, PA-C  pantoprazole (PROTONIX) 40 MG tablet TAKE 1 TABLET BY MOUTH TWICE DAILY 30 MINUTES BEFORE BREAKFAST Patient taking differently: Take 40 mg by mouth daily.  07/10/19  Yes Annitta Needs, NP  warfarin (COUMADIN) 2.5 MG tablet TAKE 1 & 1/2 (ONE & ONE-HALF) TABLETS DAILY EXCEPT 1 TABLET ON TUESDAYS AND SATURDAYS OR AS DIRECTED 07/28/19  Yes Branch, Alphonse Guild, MD  HYDROcodone-acetaminophen (NORCO/VICODIN) 5-325 MG tablet Take 1 tablet by mouth every 6 (six) hours as needed for severe pain. 08/01/19   Severn Goddard C, PA-C  gabapentin (NEURONTIN) 300 MG capsule Take 2  capsules (600 mg total) by mouth at bedtime. 11/18/17   Mahala Menghini, PA-C  gabapentin (NEURONTIN) 300 MG capsule TAKE 2 CAPSULES BY MOUTH ONCE DAILY AT NIGHT 01/30/19   Doree Albee, MD  NP THYROID 120 MG tablet Take 120 mg by mouth daily. 08/28/18   [provider]  NP THYROID 120 MG tablet Take 1 tablet by mouth once daily 06/16/19   Doree Albee, MD    Allergies    Insulin glargine and  Statins  Review of Systems   Review of Systems  Constitutional: Negative for chills, diaphoresis and fever.  Respiratory: Negative for cough and shortness of breath.   Cardiovascular: Negative for chest pain.  Gastrointestinal: Negative for abdominal pain, blood in stool, diarrhea, nausea and vomiting.  Musculoskeletal: Positive for back pain.  Neurological: Negative for dizziness, syncope, weakness, light-headedness, numbness and headaches.  All other systems reviewed and are negative.   Physical Exam Updated Vital Signs BP (!) 144/58 (BP Location: Right Arm)   Pulse (!) 58   Temp 97.6 F (36.4 C) (Oral)   Resp 20   Ht 5\' 4"  (1.626 m)   Wt 72.6 kg   SpO2 95%   BMI 27.46 kg/m   Physical Exam Vitals and nursing note reviewed.  Constitutional:      General: She is not in acute distress.    Appearance: She is well-developed. She is not diaphoretic.  HENT:     Head: Normocephalic and atraumatic.     Mouth/Throat:     Mouth: Mucous membranes are moist.     Pharynx: Oropharynx is clear.  Eyes:     Conjunctiva/sclera: Conjunctivae normal.  Cardiovascular:     Rate and Rhythm: Normal rate and regular rhythm.     Pulses: Normal pulses.          Radial pulses are 2+ on the right side and 2+ on the left side.       Posterior tibial pulses are 2+ on the right side and 2+ on the left side.     Heart sounds: Normal heart sounds.     Comments: Tactile temperature in the extremities appropriate and equal bilaterally. Pulmonary:     Effort: Pulmonary effort is normal. No  respiratory distress.     Breath sounds: Normal breath sounds.  Abdominal:     Palpations: Abdomen is soft.     Tenderness: There is no abdominal tenderness. There is no guarding.  Musculoskeletal:     Cervical back: Normal, normal range of motion and neck supple. No tenderness.     Thoracic back: Tenderness and bony tenderness present. No swelling, edema or deformity.     Lumbar back: Normal.       Back:     Right lower leg: No edema.     Left lower leg: No edema.     Comments: Tenderness in the thoracic back, as shown.  No deformity, color change, instability, or swelling noted. Range of motion of the shoulders causes pain in the back.  Normal motor function intact in all extremities. No other midline spinal tenderness.   Overall trauma exam performed without any abnormalities noted other than those mentioned.  Lymphadenopathy:     Cervical: No cervical adenopathy.  Skin:    General: Skin is warm and dry.  Neurological:     Mental Status: She is alert and oriented to person, place, and time.     Comments: No noted acute cognitive deficit. Sensation grossly intact to light touch in the extremities.   Grip strengths equal bilaterally.   Strength 5/5 in all extremities.  Ambulated to her baseline using a walker. Coordination intact.  Cranial nerves III-XII grossly intact.  Handles oral secretions without noted difficulty.  No noted phonation or speech deficit. No facial droop.   Psychiatric:        Mood and Affect: Mood and affect normal.        Speech: Speech normal.        Behavior: Behavior normal.  ED Results / Procedures / Treatments   Labs (all labs ordered are listed, but only abnormal results are displayed) Labs Reviewed  BASIC METABOLIC PANEL - Abnormal; Notable for the following components:      Result Value   Glucose, Bld 151 (*)    Creatinine, Ser 3.93 (*)    Calcium 8.6 (*)    GFR calc non Af Amer 10 (*)    GFR calc Af Amer 12 (*)    All other  components within normal limits  CBC WITH DIFFERENTIAL/PLATELET - Abnormal; Notable for the following components:   WBC 13.9 (*)    RBC 3.01 (*)    Hemoglobin 9.4 (*)    HCT 32.3 (*)    MCV 107.3 (*)    MCHC 29.1 (*)    RDW 20.1 (*)    Neutro Abs 12.1 (*)    Abs Immature Granulocytes 0.10 (*)    All other components within normal limits  PROTIME-INR - Abnormal; Notable for the following components:   Prothrombin Time 16.4 (*)    INR 1.3 (*)    All other components within normal limits   Hemoglobin  Date Value Ref Range Status  08/01/2019 9.4 (L) 12.0 - 15.0 g/dL Final  06/24/2019 8.1 (L) 12.0 - 15.0 g/dL Final  06/18/2019 9.0 (L) 11.7 - 15.5 g/dL Final  05/23/2019 10.4 (L) 12.0 - 15.0 g/dL Final   EKG None   ED ECG REPORT   Date: 08/01/2019  Rate: 58  Rhythm: normal sinus rhythm  QRS Axis: normal  Intervals: QT prolonged  ST/T Wave abnormalities: normal  Conduction Disutrbances:right bundle branch block  Narrative Interpretation:   Old EKG Reviewed: unchanged  I have personally reviewed the EKG tracing and agree with the computerized printout as noted.  Radiology DG Ribs Unilateral W/Chest Right  Result Date: 08/01/2019 CLINICAL DATA:  Right posterior rib tenderness following a fall this morning. EXAM: RIGHT RIBS AND CHEST - 3+ VIEW COMPARISON:  Portable chest dated 05/19/2019 FINDINGS: Normal sized heart. Post CABG changes. Small amount of interval linear atelectasis or scarring at the lung bases. Stable left proximal humerus fixation hardware. Old, healed right rib fractures with no acute rib fractures or pneumothorax seen. Previously described T6 and T7 vertebral compression deformities, better visualized on the thoracic spine radiographs obtained at the same time. IMPRESSION: 1. No acute rib fractures seen. 2. Small amount of interval bibasilar linear atelectasis or scarring. 3. Separately described interval T6 and T7 vertebral compression deformities. Electronically  Signed   By: Claudie Revering M.D.   On: 08/01/2019 13:45   DG Thoracic Spine 2 View  Result Date: 08/01/2019 CLINICAL DATA:  Midline back tenderness following a fall. Upper back pain. EXAM: THORACIC SPINE 2 VIEWS COMPARISON:  Chest radiographs dated 01/05/2019 FINDINGS: Site scratch the interval approximately 20% inferior endplate compression deformity of the T6 vertebral body and approximately 30% compression deformity of the T7 vertebral body. No significant bony retropulsion visualized. No well visualized acute fracture lines. Post CABG changes and previously demonstrated left humerus fixation hardware. Thoracolumbar spine degenerative changes. IMPRESSION: 1. Interval approximately 20% inferior endplate compression deformity of the T6 vertebral body and approximately 30% compression deformity of the T7 vertebral body. 2. Thoracolumbar spine degenerative changes. Electronically Signed   By: Claudie Revering M.D.   On: 08/01/2019 13:42    Procedures Procedures (including critical care time)  Medications Ordered in ED Medications  lidocaine (LIDODERM) 5 % 2 patch (2 patches Transdermal Patch Applied 08/01/19 1233)  acetaminophen (  TYLENOL) tablet 650 mg (650 mg Oral Given 08/01/19 1235)  HYDROcodone-acetaminophen (NORCO/VICODIN) 5-325 MG per tablet 1 tablet (1 tablet Oral Given 08/01/19 1439)    ED Course  I have reviewed the triage vital signs and the nursing notes.  Pertinent labs & imaging results that were available during my care of the patient were reviewed by me and considered in my medical decision making (see chart for details).  Clinical Course as of Aug 01 1807  Sat Aug 01, 2019  1235 I checked on the patient shortly after this reading. Her SPO2 is at 95% on room air.  This may reflect a period of time where the machine was not picking up a good signal. I watched the monitor even as the patient spoke with me and she was able to maintain SPO2 in the mid 90s and with no signs of distress.    SpO2(!): 88 % [SJ]  1410 Acknowledged, but nonspecific at this time.  WBC(!): 13.9 [SJ]  1510 The subtherapeutic value was discussed with the patient. She states her most recent INR was 1.5 through her PCP. Her PCP has been working with her on correcting this by changing her Coumadin dosing.  INR(!): 1.3 [SJ]  1533 Spoke with patient's full time caregiver, [name]. At the patient's request, we discussed the findings on x-rays as well as lab work.   [SJ]    Clinical Course User Index [SJ] Pari Lombard, Helane Gunther, PA-C   MDM Rules/Calculators/A&P                      Patient presents for evaluation following a fall. At urgent care, it was reported that she had an episode of hypotension. This resolved and did not recur. I suspect this may have been due to pain as patient's pain level with high when she arrived in the ED and she was clearly uncomfortable. Patient is nontoxic appearing, afebrile, not tachycardic, not tachypneic, not hypotensive, maintains excellent SPO2 on room air.   Lab work overall reassuring. T6 and T7 compression fractures noted on x-ray. No acute neurologic deficits. We had a long discussion regarding pain management options given her age and mobility status. We discussed risks and benefits. Due to the finding of her compression fractures, I thought we could entertain more robust pain management. Patient states she is willing to take extra precautions to avoid falls while taking narcotic pain management.  These would include asking for help whenever she needs to get up and always using her walker.  Patient ambulated here using her walker without difficulty.  She had no instances of hypotension during her ED course.  I have reviewed the patient's chart to obtain more information.  I reviewed and interpreted the patient's labs and radiological studies.  The patient was given instructions for home care as well as return precautions. Patient voices understanding of these instructions,  accepts the plan, and is comfortable with discharge.   Findings and plan of care discussed with Delphina Cahill, MD.   Vitals:   08/01/19 1400 08/01/19 1500 08/01/19 1530 08/01/19 1731  BP: (!) 126/48 (!) 159/65 (!) 158/57 (!) 122/58  Pulse: (!) 58   62  Resp: 13  12 13   Temp: 98.9 F (37.2 C)   98.9 F (37.2 C)  TempSrc: Oral   Oral  SpO2: 94%   95%  Weight:      Height:          Final Clinical Impression(s) / ED Diagnoses Final diagnoses:  Compression fracture of T6 vertebra, initial encounter ([place])  Compression fracture of T7 vertebra, initial encounter [facility])    Rx / DC Orders ED Discharge Orders         Ordered    HYDROcodone-acetaminophen (NORCO/VICODIN) 5-325 MG tablet  Every 6 hours PRN     08/01/19 1538           Lorayne Bender, PA-C 08/01/19 1813    Lorayne Bender, PA-C 08/01/19 1816    Davonna Belling, MD 08/02/19 1431

## 2019-08-01 NOTE — ED Provider Notes (Signed)
[place]   [id number] 08/01/19 Arrival Time: 1749  Abbreviated note CC: Back pain  SUBJECTIVE: History from: patient. [name] is a 79 y.o. female complains of mid back pain x 1 day.  Fell on back.  Denies hitting head or LOC.  Localizes the pain to the mid back.  Describes the pain as constant.  Has NOT tried OTC medications. Also states she has not ate or drank anything today.  Symptoms are made worse with leaning forward.  Complains of associated nausea.  Denies chest pain, SOB, abdominal pain, dizziness, lightheadedness, LOC.    ROS: As per HPI.  All other pertinent ROS negative.     Past Medical History:  Diagnosis Date  . Anemia   . Arthritis   . Blood transfusion without reported diagnosis   . CAD (coronary artery disease)    a. 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  . Cataract   . Chronic anemia   . Chronic combined systolic and diastolic CHF (congestive heart failure) ([place])    a. 2D echo 08/2016 at [facility]: EF 50-55% with inferior wall HK, impaired LV filling, fair study.  . CKD (chronic kidney disease), stage III   . Diabetes ([place])   . Gastritis   . GERD (gastroesophageal reflux disease)   . Gout   . Headache   . HTN (hypertension)   . Hyperlipidemia   . Hypothyroidism   . Iron deficiency anemia 11/08/2016  . Normocytic anemia 10/26/2016  . NSTEMI (non-ST elevated myocardial infarction) ([place])    a. Complex admission 08/2016 - with severe hyperglycemia, AKI on CKD, severe anemia down to Hgb 6.8, acute combined CHF, troponin of 8.5, cath deferred due to renal dysfunction.  . Thyroid disease   . Wears dentures    Past Surgical History:  Procedure Laterality Date  . BASCILIC VEIN TRANSPOSITION Right 06/12/2018   Procedure: FIRST STAGE BASCILIC VEIN TRANSPOSITION RIGHT ARM;  Surgeon: Rosetta Posner, MD;  Location: [place];  Service: Vascular;  Laterality: Right;  . BASCILIC VEIN TRANSPOSITION Right 08/07/2018   Procedure: BASCILIC  VEIN TRANSPOSITION SECOND STAGE RIGHT ARM;  Surgeon: Rosetta Posner, MD;  Location: [place];  Service: Vascular;  Laterality: Right;  . CATARACT EXTRACTION     left  . COLONOSCOPY WITH PROPOFOL N/A 10/08/2016   5 mm transverse colon polyp note resected due to plavix. hemorrhoids  . CORONARY ARTERY BYPASS GRAFT N/A 11/20/2018   Procedure: CORONARY ARTERY BYPASS GRAFTING (CABG), ON PUMP, TIMES four, USING LEFT INTERNAL MAMMARY ARTERY AND ENDOSCOPICALLY HARVESTED RIGHT GREATER SAPHENOUS VEIN;  Surgeon: Melrose Nakayama, MD;  Location: [place];  Service: Open Heart Surgery;  Laterality: N/A;  . ENTEROSCOPY N/A 08/08/2017   Procedure: ENTEROSCOPY;  Surgeon: Daneil Dolin, MD;  [name];  Service: Endoscopy;  Laterality: N/A;  . ESOPHAGOGASTRODUODENOSCOPY N/A 10/06/2016   mild chroni gastritis, negative H.pylori  . ESOPHAGOGASTRODUODENOSCOPY (EGD) WITH PROPOFOL N/A 03/12/2017   mild edema/erythema of stomach, small bowel biopsy with focal villous tip lymphocytosis, ?partially developed celiac  . 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 GIVENS CAPSULE STUDY  10/08/2016   normal  . GIVENS CAPSULE STUDY N/A 10/29/2016   occasional gastric erosion, unremarkable small bowel  . LEFT HEART CATH AND CORONARY ANGIOGRAPHY N/A 11/12/2018  Procedure: LEFT HEART CATH AND CORONARY ANGIOGRAPHY;  Surgeon: Burnell Blanks, MD;  Location: [place] [facility];  Service: Cardiovascular;  Laterality: N/A;  . ORIF HUMERUS FRACTURE Left 02/07/2015   Procedure: OPEN REDUCTION INTERNAL FIXATION (ORIF) PROXIMAL HUMERUS FRACTURE;  Surgeon: Marybelle Killings, MD;  Location: [place];  Service: Orthopedics;  Laterality: Left;  . TEE WITHOUT CARDIOVERSION N/A 11/20/2018   Procedure:  TRANSESOPHAGEAL ECHOCARDIOGRAM (TEE);  Surgeon: Melrose Nakayama, MD;  Location: [place];  Service: Open Heart Surgery;  Laterality: N/A;  . teeth extractions    . THYROID SURGERY    . TOTAL HIP ARTHROPLASTY Left 02/07/2015   Procedure: TOTAL HIP ARTHROPLASTY ANTERIOR APPROACH ;  Surgeon: Marybelle Killings, MD;  Location: [place];  Service: Orthopedics;  Laterality: Left;  . WRIST SURGERY Left    Allergies  Allergen Reactions  . Insulin Glargine Swelling    "Makes me swell like a balloon all over", including face, but without any respiratory distress or rashes. Associated with weight gain.  . Statins Other (See Comments)    "I've tried them all; my muscle aches were so bad I couldn't walk".   No current facility-administered medications on file prior to encounter.   Current Outpatient Medications on File Prior to Encounter  Medication Sig Dispense Refill  . acetaminophen (TYLENOL) 325 MG tablet Take 2 tablets (650 mg total) by mouth every 6 (six) hours as needed for mild pain or headache.    Marland Kitchen amiodarone (PACERONE) 200 MG tablet Take 1 tablet (200 mg total) by mouth daily. 30 tablet 6  . amLODipine (NORVASC) 10 MG tablet Take 1 tablet by mouth once daily (Patient taking differently: Take 10 mg by mouth daily. Mon, Wed, Fri) 90 tablet 1  . aspirin EC 81 MG EC tablet Take 1 tablet (81 mg total) by mouth daily.    . calcium acetate (PHOSLO) 667 MG capsule Take 667 mg by mouth See admin instructions. 3 capsules with each meal and 1 with snacks daily    . Cholecalciferol (VITAMIN D-3) 125 MCG (5000 UT) TABS Take 5,000 mg by mouth daily.     . Evolocumab (REPATHA SURECLICK) 056 MG/ML SOAJ Inject 140 mg into the skin every 14 (fourteen) days. 2 pen 12  . febuxostat (ULORIC) 40 MG tablet Take 1 tablet by mouth once daily 90 tablet 0  . gabapentin (NEURONTIN) 300 MG capsule Take 1 capsule by mouth 2 (two) times daily.    . metoprolol succinate (TOPROL XL) 25 MG 24 hr tablet Take 0.5 tablets (12.5 mg  total) by mouth daily. 30 tablet 11  . Multiple Vitamin (MULTIVITAMIN WITH MINERALS) TABS tablet Take 1 tablet by mouth daily.    . NP THYROID 120 MG tablet Take 1 tablet by mouth once daily 30 tablet 2  . ondansetron (ZOFRAN) 4 MG tablet TAKE 1 TABLET BY MOUTH EVERY 8 HOURS AS NEEDED FOR NAUSEA AND VOMITING 30 tablet 1  . pantoprazole (PROTONIX) 40 MG tablet TAKE 1 TABLET BY MOUTH TWICE DAILY 30 MINUTES BEFORE BREAKFAST 180 tablet 0  . warfarin (COUMADIN) 2.5 MG tablet TAKE 1 & 1/2 (ONE & ONE-HALF) TABLETS DAILY EXCEPT 1 TABLET ON TUESDAYS AND SATURDAYS OR AS DIRECTED 45 tablet 6  . [DISCONTINUED] gabapentin (NEURONTIN) 300 MG capsule Take 2 capsules (600 mg total) by mouth at bedtime. 180 capsule 0  . [DISCONTINUED] gabapentin (NEURONTIN) 300 MG capsule TAKE 2 CAPSULES BY MOUTH ONCE DAILY AT NIGHT 60 capsule 0  . [DISCONTINUED] NP THYROID  120 MG tablet Take 120 mg by mouth daily.    . [DISCONTINUED] NP THYROID 120 MG tablet Take 1 tablet by mouth once daily 30 tablet 0   Social History   Socioeconomic History  . Marital status: Widowed    Spouse name: [name]  . Number of children: 2  . Years of education: 33  . Highest education level: Not on file  Occupational History  . Occupation: retired  Tobacco Use  . Smoking status: Never Smoker  . Smokeless tobacco: Never Used  Substance and Sexual Activity  . Alcohol use: No    Alcohol/week: 0.0 standard drinks  . Drug use: No  . Sexual activity: Not Currently  Other Topics Concern  . Not on file  Social History Narrative   Lives with significant other/partner [name]   He is her caregiver   Moved from [facility]      Her Son Nikiesha Milford (lives In [place]) -- ph # [phone] ( would like to be called about care issues)   Social Determinants of Health   Financial Resource Strain:   . Difficulty of Paying Living Expenses: Not on file  Food Insecurity:   . Worried About Charity fundraiser in the Last Year: Not on file  . Ran Out of Food in  the Last Year: Not on file  Transportation Needs:   . Lack of Transportation (Medical): Not on file  . Lack of Transportation (Non-Medical): Not on file  Physical Activity:   . Days of Exercise per Week: Not on file  . Minutes of Exercise per Session: Not on file  Stress:   . Feeling of Stress : Not on file  Social Connections:   . Frequency of Communication with Friends and Family: Not on file  . Frequency of Social Gatherings with Friends and Family: Not on file  . Attends Religious Services: Not on file  . Active Member of Clubs or Organizations: Not on file  . Attends Archivist Meetings: Not on file  . Marital Status: Not on file  Intimate Partner Violence:   . Fear of Current or Ex-Partner: Not on file  . Emotionally Abused: Not on file  . Physically Abused: Not on file  . Sexually Abused: Not on file   Family History  Problem Relation Age of Onset  . Diabetes Mother   . Asthma Mother   . Early death Mother 19       Pneumonia  . Early death Father        Killed at rodeo  . CAD Neg Hx   . GI Bleed Neg Hx     OBJECTIVE:  Vitals:   08/01/19 1056  BP: (!) 92/50  Pulse: 60  Resp: 20  Temp: 97.7 F (36.5 C)  TempSrc: Oral  SpO2: 92%    Limited exam: General appearance: ALERT; appears uncomfortable, requests to lay back in chair and for pillow to be placed under back Head: NCAT Lungs: Normal respiratory effort CV: RRR Musculoskeletal: back  Palpation: TTP over t-spine ROM: LROM Skin: warm and dry; pale Neurologic: Ambulates with minimal difficulty Psychological: alert and cooperative; normal mood and affect  ASSESSMENT & PLAN:  1. Acute midline thoracic back pain   2. Hypotensive episode    Patient was waiting to be seen.  Husband came out of room stating patient was in pain and was nauseous.  Upon entering room patient appeared pale and uncomfortable.  Blood pressure was 92/50 initially, rechecked, and BP had dropped to  74/39.  Heart rate was  55 bpm.  Notified RN to call EMS.  Attempted to start IV, unsuccessful.  Oxygen saturations dropped to 88% and then improved to 90% with deep breaths.  EMS arrived and transported to [facility] ED in stable condition.     Lestine Box, PA-C 08/01/19 1212

## 2019-08-01 NOTE — ED Triage Notes (Signed)
Pt had trip and fall earlier and landed on buttocks, pt has back soreness, ambulates with walker

## 2019-08-03 ENCOUNTER — Emergency Department (HOSPITAL_COMMUNITY): Payer: Medicare HMO

## 2019-08-03 ENCOUNTER — Encounter (HOSPITAL_COMMUNITY): Payer: Self-pay | Admitting: Emergency Medicine

## 2019-08-03 ENCOUNTER — Other Ambulatory Visit: Payer: Self-pay

## 2019-08-03 ENCOUNTER — Inpatient Hospital Stay (HOSPITAL_COMMUNITY)
Admission: EM | Admit: 2019-08-03 | Discharge: 2019-08-14 | DRG: 981 | Disposition: A | Discharge status: Skilled Nursing Facility | Payer: Medicare HMO | Attending: Family Medicine | Admitting: Family Medicine

## 2019-08-03 DIAGNOSIS — E875 Hyperkalemia: Secondary | ICD-10-CM | POA: Diagnosis present

## 2019-08-03 DIAGNOSIS — N2581 Secondary hyperparathyroidism of renal origin: Secondary | ICD-10-CM | POA: Diagnosis present

## 2019-08-03 DIAGNOSIS — K5900 Constipation, unspecified: Secondary | ICD-10-CM | POA: Diagnosis present

## 2019-08-03 DIAGNOSIS — I252 Old myocardial infarction: Secondary | ICD-10-CM

## 2019-08-03 DIAGNOSIS — I4821 Permanent atrial fibrillation: Secondary | ICD-10-CM

## 2019-08-03 DIAGNOSIS — Z79891 Long term (current) use of opiate analgesic: Secondary | ICD-10-CM

## 2019-08-03 DIAGNOSIS — S22000A Wedge compression fracture of unspecified thoracic vertebra, initial encounter for closed fracture: Secondary | ICD-10-CM

## 2019-08-03 DIAGNOSIS — R791 Abnormal coagulation profile: Secondary | ICD-10-CM | POA: Diagnosis not present

## 2019-08-03 DIAGNOSIS — I5043 Acute on chronic combined systolic (congestive) and diastolic (congestive) heart failure: Secondary | ICD-10-CM | POA: Diagnosis present

## 2019-08-03 DIAGNOSIS — M4854XA Collapsed vertebra, not elsewhere classified, thoracic region, initial encounter for fracture: Secondary | ICD-10-CM | POA: Diagnosis present

## 2019-08-03 DIAGNOSIS — I4891 Unspecified atrial fibrillation: Secondary | ICD-10-CM | POA: Diagnosis present

## 2019-08-03 DIAGNOSIS — Z992 Dependence on renal dialysis: Secondary | ICD-10-CM

## 2019-08-03 DIAGNOSIS — E1122 Type 2 diabetes mellitus with diabetic chronic kidney disease: Secondary | ICD-10-CM | POA: Diagnosis present

## 2019-08-03 DIAGNOSIS — E782 Mixed hyperlipidemia: Secondary | ICD-10-CM | POA: Diagnosis present

## 2019-08-03 DIAGNOSIS — M810 Age-related osteoporosis without current pathological fracture: Secondary | ICD-10-CM | POA: Diagnosis present

## 2019-08-03 DIAGNOSIS — R4189 Other symptoms and signs involving cognitive functions and awareness: Secondary | ICD-10-CM | POA: Diagnosis present

## 2019-08-03 DIAGNOSIS — M4324 Fusion of spine, thoracic region: Secondary | ICD-10-CM | POA: Diagnosis present

## 2019-08-03 DIAGNOSIS — Z20822 Contact with and (suspected) exposure to covid-19: Secondary | ICD-10-CM | POA: Diagnosis present

## 2019-08-03 DIAGNOSIS — Z7901 Long term (current) use of anticoagulants: Secondary | ICD-10-CM

## 2019-08-03 DIAGNOSIS — I5033 Acute on chronic diastolic (congestive) heart failure: Secondary | ICD-10-CM

## 2019-08-03 DIAGNOSIS — I071 Rheumatic tricuspid insufficiency: Secondary | ICD-10-CM | POA: Diagnosis present

## 2019-08-03 DIAGNOSIS — Z6827 Body mass index (BMI) 27.0-27.9, adult: Secondary | ICD-10-CM

## 2019-08-03 DIAGNOSIS — I5032 Chronic diastolic (congestive) heart failure: Secondary | ICD-10-CM

## 2019-08-03 DIAGNOSIS — Z79899 Other long term (current) drug therapy: Secondary | ICD-10-CM

## 2019-08-03 DIAGNOSIS — I132 Hypertensive heart and chronic kidney disease with heart failure and with stage 5 chronic kidney disease, or end stage renal disease: Secondary | ICD-10-CM | POA: Diagnosis present

## 2019-08-03 DIAGNOSIS — Z8616 Personal history of COVID-19: Secondary | ICD-10-CM

## 2019-08-03 DIAGNOSIS — G92 Toxic encephalopathy: Principal | ICD-10-CM | POA: Diagnosis present

## 2019-08-03 DIAGNOSIS — Z7982 Long term (current) use of aspirin: Secondary | ICD-10-CM

## 2019-08-03 DIAGNOSIS — I2510 Atherosclerotic heart disease of native coronary artery without angina pectoris: Secondary | ICD-10-CM | POA: Diagnosis present

## 2019-08-03 DIAGNOSIS — G253 Myoclonus: Secondary | ICD-10-CM | POA: Diagnosis not present

## 2019-08-03 DIAGNOSIS — Z86010 Personal history of colonic polyps: Secondary | ICD-10-CM

## 2019-08-03 DIAGNOSIS — N186 End stage renal disease: Secondary | ICD-10-CM

## 2019-08-03 DIAGNOSIS — R001 Bradycardia, unspecified: Secondary | ICD-10-CM | POA: Diagnosis present

## 2019-08-03 DIAGNOSIS — Z951 Presence of aortocoronary bypass graft: Secondary | ICD-10-CM

## 2019-08-03 DIAGNOSIS — J9690 Respiratory failure, unspecified, unspecified whether with hypoxia or hypercapnia: Secondary | ICD-10-CM | POA: Diagnosis present

## 2019-08-03 DIAGNOSIS — N189 Chronic kidney disease, unspecified: Secondary | ICD-10-CM

## 2019-08-03 DIAGNOSIS — Z833 Family history of diabetes mellitus: Secondary | ICD-10-CM

## 2019-08-03 DIAGNOSIS — Z96642 Presence of left artificial hip joint: Secondary | ICD-10-CM | POA: Diagnosis present

## 2019-08-03 DIAGNOSIS — E118 Type 2 diabetes mellitus with unspecified complications: Secondary | ICD-10-CM | POA: Diagnosis present

## 2019-08-03 DIAGNOSIS — T402X5A Adverse effect of other opioids, initial encounter: Secondary | ICD-10-CM | POA: Diagnosis present

## 2019-08-03 DIAGNOSIS — Z888 Allergy status to other drugs, medicaments and biological substances status: Secondary | ICD-10-CM

## 2019-08-03 DIAGNOSIS — M109 Gout, unspecified: Secondary | ICD-10-CM | POA: Diagnosis present

## 2019-08-03 DIAGNOSIS — Y92009 Unspecified place in unspecified non-institutional (private) residence as the place of occurrence of the external cause: Secondary | ICD-10-CM

## 2019-08-03 DIAGNOSIS — Z825 Family history of asthma and other chronic lower respiratory diseases: Secondary | ICD-10-CM

## 2019-08-03 DIAGNOSIS — D631 Anemia in chronic kidney disease: Secondary | ICD-10-CM | POA: Diagnosis present

## 2019-08-03 DIAGNOSIS — E872 Acidosis: Secondary | ICD-10-CM | POA: Diagnosis present

## 2019-08-03 DIAGNOSIS — L89302 Pressure ulcer of unspecified buttock, stage 2: Secondary | ICD-10-CM

## 2019-08-03 DIAGNOSIS — I10 Essential (primary) hypertension: Secondary | ICD-10-CM | POA: Diagnosis present

## 2019-08-03 DIAGNOSIS — S22050A Wedge compression fracture of T5-T6 vertebra, initial encounter for closed fracture: Secondary | ICD-10-CM

## 2019-08-03 DIAGNOSIS — K219 Gastro-esophageal reflux disease without esophagitis: Secondary | ICD-10-CM | POA: Diagnosis present

## 2019-08-03 DIAGNOSIS — W1830XA Fall on same level, unspecified, initial encounter: Secondary | ICD-10-CM | POA: Diagnosis present

## 2019-08-03 DIAGNOSIS — E669 Obesity, unspecified: Secondary | ICD-10-CM | POA: Diagnosis present

## 2019-08-03 DIAGNOSIS — I480 Paroxysmal atrial fibrillation: Secondary | ICD-10-CM | POA: Diagnosis present

## 2019-08-03 DIAGNOSIS — E039 Hypothyroidism, unspecified: Secondary | ICD-10-CM | POA: Diagnosis present

## 2019-08-03 HISTORY — DX: Dependence on renal dialysis: N18.6

## 2019-08-03 HISTORY — DX: Paroxysmal atrial fibrillation: I48.0

## 2019-08-03 HISTORY — DX: Type 2 diabetes mellitus without complications: E11.9

## 2019-08-03 HISTORY — DX: Dependence on renal dialysis: Z99.2

## 2019-08-03 HISTORY — DX: Essential (primary) hypertension: I10

## 2019-08-03 LAB — BASIC METABOLIC PANEL
Anion gap: 20 — ABNORMAL HIGH (ref 5–15)
BUN: 40 mg/dL — ABNORMAL HIGH (ref 8–23)
CO2: 19 mmol/L — ABNORMAL LOW (ref 22–32)
Calcium: 8.1 mg/dL — ABNORMAL LOW (ref 8.9–10.3)
Chloride: 96 mmol/L — ABNORMAL LOW (ref 98–111)
Creatinine, Ser: 7.13 mg/dL — ABNORMAL HIGH (ref 0.44–1.00)
GFR calc Af Amer: 6 mL/min — ABNORMAL LOW (ref >60)
GFR calc non Af Amer: 5 mL/min — ABNORMAL LOW (ref >60)
Glucose, Bld: 187 mg/dL — ABNORMAL HIGH (ref 70–99)
Potassium: 5.7 mmol/L — ABNORMAL HIGH (ref 3.5–5.1)
Sodium: 135 mmol/L (ref 135–145)

## 2019-08-03 LAB — TROPONIN I (HIGH SENSITIVITY)
Troponin I (High Sensitivity): 21 ng/L — ABNORMAL HIGH (ref <18)
Troponin I (High Sensitivity): 23 ng/L — ABNORMAL HIGH (ref <18)

## 2019-08-03 LAB — VITAMIN D 25 HYDROXY (VIT D DEFICIENCY, FRACTURES): Vit D, 25-Hydroxy: 52.43 ng/mL (ref 30–100)

## 2019-08-03 LAB — CBC WITH DIFFERENTIAL/PLATELET
Abs Immature Granulocytes: 0.04 10*3/uL (ref 0.00–0.07)
Basophils Absolute: 0 10*3/uL (ref 0.0–0.1)
Basophils Relative: 0 %
Eosinophils Absolute: 0 10*3/uL (ref 0.0–0.5)
Eosinophils Relative: 0 %
HCT: 34.8 % — ABNORMAL LOW (ref 36.0–46.0)
Hemoglobin: 9.9 g/dL — ABNORMAL LOW (ref 12.0–15.0)
Immature Granulocytes: 1 %
Lymphocytes Relative: 6 %
Lymphs Abs: 0.5 10*3/uL — ABNORMAL LOW (ref 0.7–4.0)
MCH: 30.9 pg (ref 26.0–34.0)
MCHC: 28.4 g/dL — ABNORMAL LOW (ref 30.0–36.0)
MCV: 108.8 fL — ABNORMAL HIGH (ref 80.0–100.0)
Monocytes Absolute: 0.5 10*3/uL (ref 0.1–1.0)
Monocytes Relative: 6 %
Neutro Abs: 7.2 10*3/uL (ref 1.7–7.7)
Neutrophils Relative %: 87 %
Platelets: 253 10*3/uL (ref 150–400)
RBC: 3.2 MIL/uL — ABNORMAL LOW (ref 3.87–5.11)
RDW: 19.6 % — ABNORMAL HIGH (ref 11.5–15.5)
WBC: 8.2 10*3/uL (ref 4.0–10.5)
nRBC: 0 % (ref 0.0–0.2)

## 2019-08-03 LAB — PROTIME-INR
INR: 4.3 (ref 0.8–1.2)
Prothrombin Time: 41.1 seconds — ABNORMAL HIGH (ref 11.4–15.2)

## 2019-08-03 LAB — BLOOD GAS, ARTERIAL
Acid-base deficit: 4.6 mmol/L — ABNORMAL HIGH (ref 0.0–2.0)
Bicarbonate: 20.4 mmol/L (ref 20.0–28.0)
FIO2: 80
O2 Saturation: 97.1 %
Patient temperature: 37
pCO2 arterial: 43.5 mmHg (ref 32.0–48.0)
pH, Arterial: 7.3 — ABNORMAL LOW (ref 7.350–7.450)
pO2, Arterial: 108 mmHg (ref 83.0–108.0)

## 2019-08-03 LAB — RESPIRATORY PANEL BY RT PCR (FLU A&B, COVID)
Influenza A by PCR: NEGATIVE
Influenza B by PCR: NEGATIVE
SARS Coronavirus 2 by RT PCR: NEGATIVE

## 2019-08-03 LAB — PHOSPHORUS: Phosphorus: 7 mg/dL — ABNORMAL HIGH (ref 2.5–4.6)

## 2019-08-03 LAB — TSH: TSH: 1.104 u[IU]/mL (ref 0.350–4.500)

## 2019-08-03 LAB — LACTIC ACID, PLASMA: Lactic Acid, Venous: 4.9 mmol/L (ref 0.5–1.9)

## 2019-08-03 LAB — MAGNESIUM: Magnesium: 2.3 mg/dL (ref 1.7–2.4)

## 2019-08-03 MED ORDER — MORPHINE SULFATE (PF) 2 MG/ML IV SOLN
2.0000 mg | Freq: Once | INTRAVENOUS | Status: AC
  Administered 2019-08-03: 14:00:00 2 mg via INTRAVENOUS
  Filled 2019-08-03: qty 1

## 2019-08-03 MED ORDER — SODIUM CHLORIDE 0.9% FLUSH: 3.0000 mL | INTRAVENOUS | Status: DC | PRN

## 2019-08-03 MED ORDER — INSULIN ASPART 100 UNIT/ML ~~LOC~~ SOLN
0.0000 [IU] | Freq: Three times a day (TID) | SUBCUTANEOUS | Status: DC
  Administered 2019-08-07 – 2019-08-11 (×4): 1 [IU] via SUBCUTANEOUS

## 2019-08-03 MED ORDER — LIDOCAINE HCL (PF) 1 % IJ SOLN: 5.0000 mL | INTRAMUSCULAR | Status: DC | PRN

## 2019-08-03 MED ORDER — VITAMIN D 25 MCG (1000 UNIT) PO TABS
5000.0000 [IU] | ORAL_TABLET | Freq: Every day | ORAL | Status: DC
  Administered 2019-08-04 – 2019-08-14 (×9): 5000 [IU] via ORAL
  Filled 2019-08-03 (×13): qty 5

## 2019-08-03 MED ORDER — SODIUM ZIRCONIUM CYCLOSILICATE 5 G PO PACK
10.0000 g | PACK | Freq: Once | ORAL | Status: AC
  Administered 2019-08-03: 10 g via ORAL
  Filled 2019-08-03: qty 2

## 2019-08-03 MED ORDER — CALCIUM ACETATE (PHOS BINDER) 667 MG PO CAPS
667.0000 mg | ORAL_CAPSULE | Freq: Three times a day (TID) | ORAL | Status: DC
  Administered 2019-08-04 – 2019-08-05 (×4): 667 mg via ORAL
  Administered 2019-08-06: 17:00:00 2001 mg via ORAL
  Administered 2019-08-06 – 2019-08-11 (×14): 667 mg via ORAL
  Filled 2019-08-03 (×6): qty 1
  Filled 2019-08-03: qty 3
  Filled 2019-08-03 (×2): qty 1
  Filled 2019-08-03 (×3): qty 3
  Filled 2019-08-03 (×2): qty 1
  Filled 2019-08-03: qty 3
  Filled 2019-08-03 (×2): qty 1
  Filled 2019-08-03: qty 3
  Filled 2019-08-03: qty 1
  Filled 2019-08-03: qty 3
  Filled 2019-08-03 (×4): qty 1

## 2019-08-03 MED ORDER — SODIUM CHLORIDE 0.9 % IV SOLN
1.0000 g | Freq: Once | INTRAVENOUS | Status: AC
  Administered 2019-08-03: 1 g via INTRAVENOUS
  Filled 2019-08-03: qty 10

## 2019-08-03 MED ORDER — TRAMADOL HCL 50 MG PO TABS
50.0000 mg | ORAL_TABLET | Freq: Two times a day (BID) | ORAL | Status: DC | PRN
  Administered 2019-08-03: 50 mg via ORAL
  Filled 2019-08-03: qty 1

## 2019-08-03 MED ORDER — PENTAFLUOROPROP-TETRAFLUOROETH EX AERO: 1.0000 "application " | INHALATION_SPRAY | CUTANEOUS | Status: DC | PRN

## 2019-08-03 MED ORDER — CHLORHEXIDINE GLUCONATE CLOTH 2 % EX PADS
6.0000 | MEDICATED_PAD | Freq: Every day | CUTANEOUS | Status: DC
  Administered 2019-08-04 – 2019-08-08 (×3): 6 via TOPICAL

## 2019-08-03 MED ORDER — SODIUM CHLORIDE 0.9 % IV BOLUS
500.0000 mL | Freq: Once | INTRAVENOUS | Status: AC
  Administered 2019-08-03: 11:00:00 500 mL via INTRAVENOUS

## 2019-08-03 MED ORDER — ADULT MULTIVITAMIN W/MINERALS CH
1.0000 | ORAL_TABLET | Freq: Every day | ORAL | Status: DC
  Administered 2019-08-04 – 2019-08-14 (×10): 1 via ORAL
  Filled 2019-08-03 (×11): qty 1

## 2019-08-03 MED ORDER — CALCIUM GLUCONATE 10 % IV SOLN
INTRAVENOUS | Status: AC
  Filled 2019-08-03: qty 10

## 2019-08-03 MED ORDER — ONDANSETRON HCL 4 MG PO TABS: 4.0000 mg | ORAL_TABLET | Freq: Four times a day (QID) | ORAL | Status: DC | PRN

## 2019-08-03 MED ORDER — WARFARIN - PHARMACIST DOSING INPATIENT: Freq: Every day | Status: DC

## 2019-08-03 MED ORDER — VITAMIN D-3 125 MCG (5000 UT) PO TABS: 5000.0000 mg | ORAL_TABLET | Freq: Every day | ORAL | Status: DC

## 2019-08-03 MED ORDER — ATROPINE SULFATE 1 MG/10ML IJ SOSY
1.0000 mg | PREFILLED_SYRINGE | Freq: Once | INTRAMUSCULAR | Status: AC
  Administered 2019-08-03: 1 mg via INTRAVENOUS
  Filled 2019-08-03: qty 10

## 2019-08-03 MED ORDER — SODIUM CHLORIDE 0.9 % IV SOLN: 250.0000 mL | INTRAVENOUS | Status: DC | PRN

## 2019-08-03 MED ORDER — SODIUM CHLORIDE 0.9 % IV SOLN: 100.0000 mL | INTRAVENOUS | Status: DC | PRN

## 2019-08-03 MED ORDER — FEBUXOSTAT 40 MG PO TABS
40.0000 mg | ORAL_TABLET | Freq: Every day | ORAL | Status: DC
  Administered 2019-08-04 – 2019-08-14 (×11): 40 mg via ORAL
  Filled 2019-08-03 (×13): qty 1

## 2019-08-03 MED ORDER — PANTOPRAZOLE SODIUM 40 MG PO TBEC
40.0000 mg | DELAYED_RELEASE_TABLET | Freq: Every day | ORAL | Status: DC
  Administered 2019-08-03 – 2019-08-14 (×11): 40 mg via ORAL
  Filled 2019-08-03 (×12): qty 1

## 2019-08-03 MED ORDER — LIDOCAINE-PRILOCAINE 2.5-2.5 % EX CREA: 1.0000 "application " | TOPICAL_CREAM | CUTANEOUS | Status: DC | PRN

## 2019-08-03 MED ORDER — ASPIRIN EC 81 MG PO TBEC
81.0000 mg | DELAYED_RELEASE_TABLET | Freq: Every day | ORAL | Status: DC
  Administered 2019-08-04 – 2019-08-14 (×11): 81 mg via ORAL
  Filled 2019-08-03 (×13): qty 1

## 2019-08-03 MED ORDER — GABAPENTIN 300 MG PO CAPS
300.0000 mg | ORAL_CAPSULE | Freq: Two times a day (BID) | ORAL | Status: DC
  Administered 2019-08-03 – 2019-08-07 (×8): 300 mg via ORAL
  Filled 2019-08-03 (×8): qty 1

## 2019-08-03 MED ORDER — ONDANSETRON HCL 4 MG/2ML IJ SOLN: 4.0000 mg | Freq: Four times a day (QID) | INTRAMUSCULAR | Status: DC | PRN

## 2019-08-03 MED ORDER — THYROID 30 MG PO TABS
120.0000 mg | ORAL_TABLET | Freq: Every day | ORAL | Status: DC
  Administered 2019-08-04 – 2019-08-14 (×11): 120 mg via ORAL
  Filled 2019-08-03 (×8): qty 4
  Filled 2019-08-03: qty 1
  Filled 2019-08-03 (×3): qty 4
  Filled 2019-08-03: qty 1

## 2019-08-03 MED ORDER — ACETAMINOPHEN 325 MG PO TABS
650.0000 mg | ORAL_TABLET | Freq: Four times a day (QID) | ORAL | Status: DC | PRN
  Administered 2019-08-08 – 2019-08-12 (×4): 650 mg via ORAL
  Filled 2019-08-03 (×5): qty 2

## 2019-08-03 MED ORDER — SODIUM CHLORIDE 0.9% FLUSH
3.0000 mL | Freq: Two times a day (BID) | INTRAVENOUS | Status: DC
  Administered 2019-08-03 – 2019-08-13 (×18): 3 mL via INTRAVENOUS

## 2019-08-03 NOTE — ED Provider Notes (Signed)
[facility] EMERGENCY DEPARTMENT Provider Note   CSN: [id number] Arrival date & time: 08/03/19  1059     History Chief Complaint  Patient presents with  . Bradycardia    [name] is a 79 y.o. female.  HPI   Patient presented to the ED for altered mental status.  Patient has a history of multiple medical problems including end-stage renal disease on dialysis.  Patient was seen in the emergency room 2 days ago.  Patient had fallen that day.  She was noted to have T6 and T7 compression fractures.  Patient was given prescriptions for narcotic pain medications and was sent home.  Patient was found unresponsive this morning.  EMS arrived and administered Narcan.  Patient is now more awake and alert.  However she was noted to be hypoxic and bradycardic.  Denies any chest pain.  She denies any fevers or chills.  She denies any vomiting or diarrhea.  She is having pain in her back and is not comfortable in the gurney.  Past Medical History:  Diagnosis Date  . Anemia   . Arthritis   . Blood transfusion without reported diagnosis   . CAD (coronary artery disease)    a. 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  . Cataract   . Chronic anemia   . Chronic combined systolic and diastolic CHF (congestive heart failure) ([place])    a. 2D echo 08/2016 at [facility]: EF 50-55% with inferior wall HK, impaired LV filling, fair study.  . CKD (chronic kidney disease), stage III   . Diabetes ([place])   . Gastritis   . GERD (gastroesophageal reflux disease)   . Gout   . Headache   . HTN (hypertension)   . Hyperlipidemia   . Hypothyroidism   . Iron deficiency anemia 11/08/2016  . Normocytic anemia 10/26/2016  . NSTEMI (non-ST elevated myocardial infarction) ([place])    a. Complex admission 08/2016 - with severe hyperglycemia, AKI on CKD, severe anemia down to Hgb 6.8, acute combined CHF, troponin of 8.5, cath deferred due to renal dysfunction.  . Thyroid disease   . Wears dentures      Patient Active Problem List   Diagnosis Date Noted  . Cellulitis 07/02/2019  . Diabetic ulcer of right foot ([place]) 06/18/2019  . Acute hypoxemic respiratory failure due to COVID-19 ([place]) 05/19/2019  . Atrial fibrillation ([place]) 12/03/2018  . Encounter for therapeutic drug monitoring 12/03/2018  . Pressure injury of skin 11/24/2018  . CAD (coronary artery disease) 11/21/2018  . Acute on chronic diastolic CHF (congestive heart failure) ([place]) 11/21/2018  . Respiratory failure with hypoxia ([place]) 11/21/2018  . End-stage renal disease on hemodialysis ([place]) 11/21/2018  . Diabetes mellitus type 2, controlled, with complications ([place]) 40/98/1191  . 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 ESRD (end stage renal disease) on dialysis ([place]) 11/12/2018  . Non-ST elevation (NSTEMI) myocardial infarction ([place]) 11/06/2018  . Polyp of transverse colon 10/08/2017  . Anemia 08/07/2017  . GI bleed 07/30/2017  . Hyperkalemia 07/30/2017  . Essential hypertension, benign 12/13/2016  . Hypothyroidism 12/13/2016  . Iron deficiency anemia 11/08/2016  . Normocytic anemia 10/26/2016  . Elevated troponin   . Aortic atherosclerosis ([place]) 10/25/2016  . Symptomatic anemia 10/04/2016  . History of non-ST elevation myocardial infarction (NSTEMI) 10/04/2016  . Mixed hyperlipidemia 10/04/2016  . Neuropathy 09/20/2016  . Chronic kidney disease, stage IV (severe) ([place]) 08/03/2015  . Fall 02/05/2015  . Hip fracture ([place]) 02/05/2015  . HTN (hypertension) 02/05/2015  .  Gout 02/05/2015  . Left humeral fracture 02/05/2015  . Hyperlipidemia associated with type 2 diabetes mellitus ([place]) 05/01/2013    Past Surgical History:  Procedure Laterality Date  . BASCILIC VEIN TRANSPOSITION Right 06/12/2018   Procedure: FIRST STAGE BASCILIC VEIN TRANSPOSITION RIGHT ARM;  Surgeon: Rosetta Posner, MD;  Location: [place];  Service: Vascular;  Laterality: Right;  . BASCILIC VEIN TRANSPOSITION Right 08/07/2018   Procedure: BASCILIC VEIN  TRANSPOSITION SECOND STAGE RIGHT ARM;  Surgeon: Rosetta Posner, MD;  Location: [place];  Service: Vascular;  Laterality: Right;  . CATARACT EXTRACTION     left  . COLONOSCOPY WITH PROPOFOL N/A 10/08/2016   5 mm transverse colon polyp note resected due to plavix. hemorrhoids  . CORONARY ARTERY BYPASS GRAFT N/A 11/20/2018   Procedure: CORONARY ARTERY BYPASS GRAFTING (CABG), ON PUMP, TIMES four, USING LEFT INTERNAL MAMMARY ARTERY AND ENDOSCOPICALLY HARVESTED RIGHT GREATER SAPHENOUS VEIN;  Surgeon: Melrose Nakayama, MD;  Location: [place];  Service: Open Heart Surgery;  Laterality: N/A;  . ENTEROSCOPY N/A 08/08/2017   Procedure: ENTEROSCOPY;  Surgeon: Daneil Dolin, MD;  [name];  Service: Endoscopy;  Laterality: N/A;  . ESOPHAGOGASTRODUODENOSCOPY N/A 10/06/2016   mild chroni gastritis, negative H.pylori  . ESOPHAGOGASTRODUODENOSCOPY (EGD) WITH PROPOFOL N/A 03/12/2017   mild edema/erythema of stomach, small bowel biopsy with focal villous tip lymphocytosis, ?partially developed celiac  . 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 GIVENS CAPSULE STUDY  10/08/2016   normal  . GIVENS CAPSULE STUDY N/A 10/29/2016   occasional gastric erosion, unremarkable small bowel  . LEFT HEART CATH AND CORONARY ANGIOGRAPHY N/A 11/12/2018   Procedure: LEFT HEART CATH AND CORONARY ANGIOGRAPHY;  Surgeon: Burnell Blanks, MD;  Location: [place] CV [facility];  Service: Cardiovascular;  Laterality: N/A;  . ORIF HUMERUS FRACTURE Left 02/07/2015   Procedure: OPEN REDUCTION INTERNAL FIXATION (ORIF) PROXIMAL HUMERUS FRACTURE;  Surgeon: Marybelle Killings, MD;  Location: [place];  Service: Orthopedics;  Laterality: Left;  . TEE WITHOUT CARDIOVERSION N/A 11/20/2018   Procedure: TRANSESOPHAGEAL  ECHOCARDIOGRAM (TEE);  Surgeon: Melrose Nakayama, MD;  Location: [place];  Service: Open Heart Surgery;  Laterality: N/A;  . teeth extractions    . THYROID SURGERY    . TOTAL HIP ARTHROPLASTY Left 02/07/2015   Procedure: TOTAL HIP ARTHROPLASTY ANTERIOR APPROACH ;  Surgeon: Marybelle Killings, MD;  Location: [place];  Service: Orthopedics;  Laterality: Left;  . WRIST SURGERY Left      OB History   No obstetric history on file.     Family History  Problem Relation Age of Onset  . Diabetes Mother   . Asthma Mother   . Early death Mother 48       Pneumonia  . Early death Father        Killed at rodeo  . CAD Neg Hx   . GI Bleed Neg Hx     Social History   Tobacco Use  . Smoking status: Never Smoker  . Smokeless tobacco: Never Used  Substance Use Topics  . Alcohol use: No    Alcohol/week: 0.0 standard drinks  . Drug use: No    Home Medications Prior to Admission medications   Medication Sig Start Date End Date Taking? Authorizing Provider  acetaminophen (TYLENOL) 325 MG tablet Take 2  tablets (650 mg total) by mouth every 6 (six) hours as needed for mild pain or headache. 11/28/18   Nani Skillern, PA-C  amiodarone (PACERONE) 200 MG tablet Take 1 tablet (200 mg total) by mouth daily. 01/28/19   Arnoldo Lenis, MD  amLODipine (NORVASC) 10 MG tablet Take 1 tablet by mouth once daily Patient taking differently: Take 10 mg by mouth daily. Jory Sims, Fri 05/12/19   Pixie Casino, MD  aspirin EC 81 MG EC tablet Take 1 tablet (81 mg total) by mouth daily. 11/28/18   Nani Skillern, PA-C  calcium acetate (PHOSLO) 667 MG capsule Take 667 mg by mouth See admin instructions. 3 capsules with each meal and 1 with snacks daily    [provider]  Cholecalciferol (VITAMIN D-3) 125 MCG (5000 UT) TABS Take 5,000 mg by mouth daily.     [provider]  Evolocumab (REPATHA SURECLICK) 419 MG/ML SOAJ Inject 140 mg into the skin every 14 (fourteen) days. 01/06/19   Arnoldo Lenis, MD  febuxostat (ULORIC) 40 MG tablet Take 1 tablet by mouth once daily 06/16/19   Hurshel Party C, MD  gabapentin (NEURONTIN) 300 MG capsule Take 1 capsule by mouth 2 (two) times daily. 06/12/19   [provider]  HYDROcodone-acetaminophen (NORCO/VICODIN) 5-325 MG tablet Take 1 tablet by mouth every 6 (six) hours as needed for severe pain. 08/01/19   Joy, Shawn C, PA-C  metoprolol succinate (TOPROL XL) 25 MG 24 hr tablet Take 0.5 tablets (12.5 mg total) by mouth daily. 11/28/18 11/28/19  Nani Skillern, PA-C  Multiple Vitamin (MULTIVITAMIN WITH MINERALS) TABS tablet Take 1 tablet by mouth daily.    [provider]  NP THYROID 120 MG tablet Take 1 tablet by mouth once daily 07/22/19 08/21/19  Hurshel Party C, MD  ondansetron (ZOFRAN) 4 MG tablet TAKE 1 TABLET BY MOUTH EVERY 8 HOURS AS NEEDED FOR NAUSEA AND VOMITING Patient taking differently: Take 4 mg by mouth every 8 (eight) hours as needed for nausea or vomiting.  07/14/19   Mahala Menghini, PA-C  pantoprazole (PROTONIX) 40 MG tablet TAKE 1 TABLET BY MOUTH TWICE DAILY 30 MINUTES BEFORE BREAKFAST Patient taking differently: Take 40 mg by mouth daily.  07/10/19   Annitta Needs, NP  warfarin (COUMADIN) 2.5 MG tablet TAKE 1 & 1/2 (ONE & ONE-HALF) TABLETS DAILY EXCEPT 1 TABLET ON TUESDAYS AND SATURDAYS OR AS DIRECTED 07/28/19   Arnoldo Lenis, MD  gabapentin (NEURONTIN) 300 MG capsule Take 2 capsules (600 mg total) by mouth at bedtime. 11/18/17   Mahala Menghini, PA-C  gabapentin (NEURONTIN) 300 MG capsule TAKE 2 CAPSULES BY MOUTH ONCE DAILY AT NIGHT 01/30/19   Doree Albee, MD  NP THYROID 120 MG tablet Take 120 mg by mouth daily. 08/28/18   [provider]  NP THYROID 120 MG tablet Take 1 tablet by mouth once daily 06/16/19   Hurshel Party C, MD    Allergies    Insulin glargine and Statins  Review of Systems   Review of Systems  All other systems reviewed and are negative.   Physical Exam Updated  Vital Signs BP (!) 145/52   Pulse 61   Resp 16   Ht 1.651 m (5\' 5" )   Wt 72.6 kg   SpO2 98%   BMI 26.63 kg/m   Physical Exam Vitals and nursing note reviewed.  Constitutional:      General: She is not in acute distress.  Appearance: She is well-developed. She is ill-appearing.  HENT:     Head: Normocephalic and atraumatic.     Right Ear: External ear normal.     Left Ear: External ear normal.  Eyes:     General: No scleral icterus.       Right eye: No discharge.        Left eye: No discharge.     Conjunctiva/sclera: Conjunctivae normal.  Neck:     Trachea: No tracheal deviation.  Cardiovascular:     Rate and Rhythm: Regular rhythm. Bradycardia present.  Pulmonary:     Effort: Pulmonary effort is normal. No respiratory distress.     Breath sounds: Normal breath sounds. No stridor. No wheezing or rales.  Abdominal:     General: Bowel sounds are normal. There is no distension.     Palpations: Abdomen is soft.     Tenderness: There is no abdominal tenderness. There is no guarding or rebound.  Musculoskeletal:        General: No tenderness.     Cervical back: Neck supple.  Skin:    General: Skin is warm and dry.     Coloration: Skin is pale.     Findings: No rash.  Neurological:     Mental Status: She is alert.     Cranial Nerves: No cranial nerve deficit (no facial droop, extraocular movements intact, no slurred speech).     Sensory: No sensory deficit.     Motor: No abnormal muscle tone or seizure activity.     Coordination: Coordination normal.     Comments: Patient follows commands, she moves all extremities, normal sensation, no facial droop     ED Results / Procedures / Treatments   Labs (all labs ordered are listed, but only abnormal results are displayed) Labs Reviewed  CBC WITH DIFFERENTIAL/PLATELET - Abnormal; Notable for the following components:      Result Value   RBC 3.20 (*)    Hemoglobin 9.9 (*)    HCT 34.8 (*)    MCV 108.8 (*)    MCHC 28.4  (*)    RDW 19.6 (*)    Lymphs Abs 0.5 (*)    All other components within normal limits  BASIC METABOLIC PANEL - Abnormal; Notable for the following components:   Potassium 5.7 (*)    Chloride 96 (*)    CO2 19 (*)    Glucose, Bld 187 (*)    BUN 40 (*)    Creatinine, Ser 7.13 (*)    Calcium 8.1 (*)    GFR calc non Af Amer 5 (*)    GFR calc Af Amer 6 (*)    Anion gap 20 (*)    All other components within normal limits  BLOOD GAS, ARTERIAL - Abnormal; Notable for the following components:   pH, Arterial 7.300 (*)    Acid-base deficit 4.6 (*)    All other components within normal limits  LACTIC ACID, PLASMA - Abnormal; Notable for the following components:   Lactic Acid, Venous 4.9 (*)    All other components within normal limits  TROPONIN I (HIGH SENSITIVITY) - Abnormal; Notable for the following components:   Troponin I (High Sensitivity) 21 (*)    All other components within normal limits  TROPONIN I (HIGH SENSITIVITY) - Abnormal; Notable for the following components:   Troponin I (High Sensitivity) 23 (*)    All other components within normal limits  CULTURE, BLOOD (ROUTINE X 2)  CULTURE, BLOOD (ROUTINE X 2)  EKG EKG Interpretation  Date/Time:  Monday August 03 2019 11:25:28 EST Ventricular Rate:  37 PR Interval:    QRS Duration: 163 QT Interval:  573 QTC Calculation: 450 R Axis:   96 Text Interpretation: Junctional rhythm Nonspecific intraventricular conduction delay bradycardia new since last tracing Confirmed by Dorie Rank [phone]) on 08/03/2019 11:33:47 AM   EKG Interpretation  Date/Time:  Monday August 03 2019 12:02:54 EST Ventricular Rate:  76 PR Interval:    QRS Duration: 148 QT Interval:  432 QTC Calculation: 486 R Axis:   72 Text Interpretation: Sinus rhythm IVCD, consider atypical RBBB junctional rhythm resolved Confirmed by Dorie Rank [phone]) on 08/03/2019 12:46:32 PM        Radiology DG Chest Portable 1 View  Result Date: 08/03/2019 CLINICAL DATA:   Altered level of consciousness, bradycardia EXAM: PORTABLE CHEST 1 VIEW COMPARISON:  08/01/2019 FINDINGS: Cardiomegaly. Prior CABG. Elevation of the left hemidiaphragm with left base atelectasis and small left effusion. No confluent opacity on the right. Mild vascular congestion. IMPRESSION: Mild cardiomegaly, vascular congestion. Elevation of the left hemidiaphragm with left base atelectasis and small left effusion. Electronically Signed   By: Rolm Baptise M.D.   On: 08/03/2019 11:58    Procedures .Critical Care Performed by: Dorie Rank, MD Authorized by: Dorie Rank, MD   Critical care provider statement:    Critical care time (minutes):  45   Critical care was time spent personally by me on the following activities:  Discussions with consultants, evaluation of patient's response to treatment, examination of patient, ordering and performing treatments and interventions, ordering and review of laboratory studies, ordering and review of radiographic studies, pulse oximetry, re-evaluation of patient's condition, obtaining history from patient or surrogate and review of old charts   (including critical care time)  Medications Ordered in ED Medications  calcium gluconate 10 % injection (  Canceled Entry 08/03/19 1129)  atropine 1 MG/10ML injection 1 mg (1 mg Intravenous Given 08/03/19 1127)  sodium chloride 0.9 % bolus 500 mL (0 mLs Intravenous Stopped 08/03/19 1252)  calcium gluconate 1 g in sodium chloride 0.9 % 100 mL IVPB (0 g Intravenous Stopped 08/03/19 1230)  sodium zirconium cyclosilicate (LOKELMA) packet 10 g (10 g Oral Given 08/03/19 1414)  morphine 2 MG/ML injection 2 mg (2 mg Intravenous Given 08/03/19 1415)    ED Course  I have reviewed the triage vital signs and the nursing notes.  Pertinent labs & imaging results that were available during my care of the patient were reviewed by me and considered in my medical decision making (see chart for details).  Clinical Course as of Aug 02 1444   Mon Aug 03, 2019  1126 Bradycardia noted.  Patient's last dialysis was Friday.  Will give a dose of calcium as hyperkalemia is a concern   [JK]  1141 Heart rate is now in the 50s.  Blood pressure is in the 120s.  Oxygen saturation is 100% with supplemental oxygen.  Patient's heart rate improved after the calcium in the atropine   [JK]  1400 Pt remains in sinus rhythm.  Blood pressure and pulse normal.   [JK]  1433 Discussed with Dr Dyann Kief.  Will plan on admission but I will discuss with cardiology to get their recommendations regarding [facility] vs [facility].     [JK]  5462 Case discussed with Dr Dyann Kief and Dr Domenic Polite.   [JK]    Clinical Course User Index [JK] Dorie Rank, MD   MDM Rules/Calculators/A&P  Patient initially presented with altered mental status.  She was given Narcan by EMS but in the ED she presented bradycardic and hypoxic.  Patient was initially on facemask but she is currently on nasal cannula oxygen.  Patient was given calcium as well as atropine and her heart rate has remained stable now.  She is no longer in a junctional rhythm and is now in a sinus rhythm.  Patient is slightly hyperkalemic and will need dialysis today but I do not think her degree of electrolyte disturbance would account for her cardiac abnormality.  She warrants further cardiac monitoring.  Patient will require dialysis.  I will consult with cardiology and plan on admission to the medical service for further treatment and management. Final Clinical Impression(s) / ED Diagnoses Final diagnoses:  Bradycardia  Chronic renal failure, unspecified CKD stage  Hyperkalemia      Dorie Rank, MD 08/03/19 1446

## 2019-08-03 NOTE — ED Notes (Signed)
Date and time results received: 08/03/19 1:29 PM (use smartphrase ".now" to insert current time)  Test: Lactic Critical Value: 4.9  Name of Provider Notified: Tomi Bamberger  Orders Received? Or Actions Taken?: Orders Received - See Orders for details

## 2019-08-03 NOTE — Progress Notes (Signed)
ANTICOAGULATION CONSULT NOTE - Initial Consult  Pharmacy Consult for Warfarin Indication: atrial fibrillation  Allergies  Allergen Reactions  . Insulin Glargine Swelling    "Makes me swell like a balloon all over", including face, but without any respiratory distress or rashes. Associated with weight gain.  . Statins Other (See Comments)    "I've tried them all; my muscle aches were so bad I couldn't walk".    Patient Measurements: Height: 5\' 5"  (165.1 cm) Weight: 160 lb 0.9 oz (72.6 kg) IBW/kg (Calculated) : 57  Vital Signs: Temp Source: Oral (03/08 1116) BP: 127/43 (03/08 1804) Pulse Rate: 40 (03/08 1806)  Labs: Recent Labs    08/01/19 1249 08/03/19 1120 08/03/19 1322 08/03/19 1651  HGB 9.4* 9.9*  --   --   HCT 32.3* 34.8*  --   --   PLT 224 253  --   --   LABPROT 16.4*  --   --  41.1*  INR 1.3*  --   --  4.3*  CREATININE 3.93* 7.13*  --   --   TROPONINIHS  --  21* 23*  --     Estimated Creatinine Clearance: 6.4 mL/min (A) (by C-G formula based on SCr of 7.13 mg/dL (H)).   Medical History: Past Medical History:  Diagnosis Date  . Anemia   . Arthritis   . Blood transfusion without reported diagnosis   . CAD (coronary artery disease)    a. 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  . Cataract   . Chronic anemia   . Chronic combined systolic and diastolic CHF (congestive heart failure) ([place])    a. 2D echo 08/2016 at [facility]: EF 50-55% with inferior wall HK, impaired LV filling, fair study.  . CKD (chronic kidney disease), stage III   . Diabetes ([place])   . Gastritis   . GERD (gastroesophageal reflux disease)   . Gout   . Headache   . HTN (hypertension)   . Hyperlipidemia   . Hypothyroidism   . Iron deficiency anemia 11/08/2016  . Normocytic anemia 10/26/2016  . NSTEMI (non-ST elevated myocardial infarction) ([place])    a. Complex admission 08/2016 - with severe hyperglycemia, AKI on CKD, severe anemia down to Hgb 6.8, acute combined CHF,  troponin of 8.5, cath deferred due to renal dysfunction.  . Thyroid disease   . Wears dentures     Medications:  PTA Warfarin  Assessment:  79 yr female presents with AMS   PMH significant for DM, AFib, HTN, dHF, ESRD, HLD and hypothyroidism.  Recent fall sustaining T6-T7 compression fracture (08/01/19).  Pharmacy consulted to continue dosing warfarin  According to med rec: patient recently on 3.75mg  daily except 2.5mg  on Tues & Sat.  Family reports dose was adjusted and now takes 5mg  on certain days (unsure which days).  Reports last warfarin dose taken was 5mg  on 08/02/19 @ 1800.  INR = 4.3 (supratherapeutic)  Goal of Therapy:  INR 2-3    Plan:   No additional warfarin tonight due to supratherapeutic INR  Daily INR  Nas Wafer, Toribio Harbour, PharmD 08/03/2019,7:02 PM

## 2019-08-03 NOTE — ED Notes (Signed)
Pt placed on pacer pads and Dr. Tomi Bamberger at bedside to evaluate. Pt is conversive at this time.

## 2019-08-03 NOTE — Consult Note (Signed)
[name] Admit Date: 08/03/2019 08/03/2019 Rexene Agent Requesting Physician:  Madera MD  Reason for Consult:  ESRD, Hyperkalemia, Bradycardia, Hypoxia HPI:  54F ESRD MWF at [facility] in [place] who had T6 and T7 compression fractures identified on an emergency room visit on 3/6 and returned today with confusion and found to have bradycardia and hyperkalemia.  Her discharge medications included narcotics, when EMS arrived to evaluate her encephalopathy she received Narcan with some improvement.  She has remained bradycardic with a junctional rhythm and has received atropine, calcium, with temporary improvement but at the time of my evaluation the heart rate has dropped again into the 40s.  Blood pressures have been normal.  No pacing has been needed.  She has been hypoxic requiring nasal cannula and nonrebreather at times.  She uses a right upper arm AV fistula.  She states that she had dialysis on Friday, uneventful.  Labs here have K5.7, bicarbonate 19.  Fistula has a bruit and thrill.  No fevers.  Portable chest x-ray in ED shows cardiomegaly, vascular congestion.  EKG does not show peaked T waves.  QRS intervals mildly prolonged.  Other past history notable for CAD status post CABG, anemia, hyperlipidemia, hypertension, gout, GERD, DM2, history of atrial fibrillation, CHF  COVID-19 CDC guideline data points:  The patient presents with [exposure to, suspected, or confirmed] COVID-19 with associated symptoms of [fever, dry cough, SOB, anorexia, or diarrhea], complicated by this/these comorbidities: [none].  Patient educated to notify a healthcare professional if they develop further symptoms that include chest pain, arrhythmias/palpitations, prolonged SOB or fever, or other concerning symptoms.   PMH  Past Medical History:  Diagnosis Date  . Anemia   . Arthritis   . Blood transfusion without reported diagnosis   . CAD (coronary artery disease)    a. s/p CABG on 11/20/2018 with  LIMA-LAD, Seq SVG-OM1-OM2, and SVG-PDA.  . Cataract   . Chronic anemia   . Chronic combined systolic and diastolic CHF (congestive heart failure) ([place])    a. 2D echo 08/2016 at [facility] EF 50-55% with inferior wall HK, impaired LV filling, fair study.  . CKD (chronic kidney disease), stage III   . Diabetes ([place])   . Gastritis   . GERD (gastroesophageal reflux disease)   . Gout   . Headache   . HTN (hypertension)   . Hyperlipidemia   . Hypothyroidism   . Iron deficiency anemia 11/08/2016  . Normocytic anemia 10/26/2016  . NSTEMI (non-ST elevated myocardial infarction) ([place])    a. Complex admission 08/2016 - with severe hyperglycemia, AKI on CKD, severe anemia down to Hgb 6.8, acute combined CHF, troponin of 8.5, cath deferred due to renal dysfunction.  . Thyroid disease   . Wears dentures    PSH  Past Surgical History:  Procedure Laterality Date  . BASCILIC VEIN TRANSPOSITION Right 06/12/2018   Procedure: FIRST STAGE BASCILIC VEIN TRANSPOSITION RIGHT ARM;  Surgeon: Rosetta Posner, MD;  Location: [place];  Service: Vascular;  Laterality: Right;  . BASCILIC VEIN TRANSPOSITION Right 08/07/2018   Procedure: BASCILIC VEIN TRANSPOSITION SECOND STAGE RIGHT ARM;  Surgeon: Rosetta Posner, MD;  Location: [place];  Service: Vascular;  Laterality: Right;  . CATARACT EXTRACTION     left  . COLONOSCOPY WITH PROPOFOL N/A 10/08/2016   5 mm transverse colon polyp note resected due to plavix. hemorrhoids  . CORONARY ARTERY BYPASS GRAFT N/A 11/20/2018   Procedure: CORONARY ARTERY BYPASS GRAFTING (CABG), ON PUMP, TIMES four, USING LEFT INTERNAL MAMMARY  ARTERY AND ENDOSCOPICALLY HARVESTED RIGHT GREATER SAPHENOUS VEIN;  Surgeon: Melrose Nakayama, MD;  Location: [place];  Service: Open Heart Surgery;  Laterality: N/A;  . ENTEROSCOPY N/A 08/08/2017   Procedure: ENTEROSCOPY;  Surgeon: Daneil Dolin, MD;  [name];  Service: Endoscopy;  Laterality: N/A;  . ESOPHAGOGASTRODUODENOSCOPY N/A 10/06/2016   mild  chroni gastritis, negative H.pylori  . ESOPHAGOGASTRODUODENOSCOPY (EGD) WITH PROPOFOL N/A 03/12/2017   mild edema/erythema of stomach, small bowel biopsy with focal villous tip lymphocytosis, ?partially developed celiac  . 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 GIVENS CAPSULE STUDY  10/08/2016   normal  . GIVENS CAPSULE STUDY N/A 10/29/2016   occasional gastric erosion, unremarkable small bowel  . LEFT HEART CATH AND CORONARY ANGIOGRAPHY N/A 11/12/2018   Procedure: LEFT HEART CATH AND CORONARY ANGIOGRAPHY;  Surgeon: Burnell Blanks, MD;  Location: [facility];  Service: Cardiovascular;  Laterality: N/A;  . ORIF HUMERUS FRACTURE Left 02/07/2015   Procedure: OPEN REDUCTION INTERNAL FIXATION (ORIF) PROXIMAL HUMERUS FRACTURE;  Surgeon: Marybelle Killings, MD;  Location: [facility] [facility];  Service: Orthopedics;  Laterality: Left;  . TEE WITHOUT CARDIOVERSION N/A 11/20/2018   Procedure: TRANSESOPHAGEAL ECHOCARDIOGRAM (TEE);  Surgeon: Melrose Nakayama, MD;  Location: [facility];  Service: Open Heart Surgery;  Laterality: N/A;  . teeth extractions    . THYROID SURGERY    . TOTAL HIP ARTHROPLASTY Left 02/07/2015   Procedure: TOTAL HIP ARTHROPLASTY ANTERIOR APPROACH ;  Surgeon: Marybelle Killings, MD;  Location: [facility];  Service: Orthopedics;  Laterality: Left;  . WRIST SURGERY Left    FH  Family History  Problem Relation Age of Onset  . Diabetes Mother   . Asthma Mother   . Early death Mother 2       Pneumonia  . Early death Father        Killed at rodeo  . CAD Neg Hx   . GI Bleed Neg Hx    SH  reports that she has never smoked. She has never used smokeless tobacco. She reports that she does not drink alcohol or use drugs. Allergies  Allergies  Allergen Reactions  . Insulin  Glargine Swelling    "Makes me swell like a balloon all over", including face, but without any respiratory distress or rashes. Associated with weight gain.  . Statins Other (See Comments)    "I've tried them all; my muscle aches were so bad I couldn't walk".   Home medications Prior to Admission medications   Medication Sig Start Date End Date Taking? Authorizing Provider  acetaminophen (TYLENOL) 325 MG tablet Take 2 tablets (650 mg total) by mouth every 6 (six) hours as needed for mild pain or headache. 11/28/18  Yes Lars Pinks M, PA-C  amiodarone (PACERONE) 200 MG tablet Take 1 tablet (200 mg total) by mouth daily. 01/28/19  Yes Arnoldo Lenis, MD  aspirin EC 81 MG EC tablet Take 1 tablet (81 mg total) by mouth daily. 11/28/18  Yes Lars Pinks M, PA-C  calcium acetate (PHOSLO) 667 MG capsule Take 667-2,001 mg by mouth in the morning, at noon, in the evening, and at bedtime. Take 3 capsules with each meal and 1 capsule with snacks daily   Yes [provider]  Cholecalciferol (VITAMIN D-3) 125 MCG (5000 UT) TABS Take  5,000 mg by mouth daily.    Yes [provider]  Evolocumab (REPATHA SURECLICK) 130 MG/ML SOAJ Inject 140 mg into the skin every 14 (fourteen) days. 01/06/19  Yes BranchAlphonse Guild, MD  febuxostat (ULORIC) 40 MG tablet Take 1 tablet by mouth once daily 06/16/19  Yes Gosrani, Nimish C, MD  gabapentin (NEURONTIN) 300 MG capsule Take 1 capsule by mouth 2 (two) times daily as needed.  06/12/19  Yes [provider]  HYDROcodone-acetaminophen (NORCO/VICODIN) 5-325 MG tablet Take 1 tablet by mouth every 6 (six) hours as needed for severe pain. 08/01/19  Yes Joy, Shawn C, PA-C  metoprolol succinate (TOPROL XL) 25 MG 24 hr tablet Take 0.5 tablets (12.5 mg total) by mouth daily. Patient taking differently: Take 12.5 mg by mouth every Monday, Wednesday, and Friday. Monday Wednesday Friday 11/28/18 11/28/19 Yes Lars Pinks M, PA-C  Multiple Vitamin  (MULTIVITAMIN WITH MINERALS) TABS tablet Take 1 tablet by mouth daily.   Yes [provider]  NP THYROID 120 MG tablet Take 1 tablet by mouth once daily 07/22/19 08/21/19 Yes Gosrani, Nimish C, MD  ondansetron (ZOFRAN) 4 MG tablet TAKE 1 TABLET BY MOUTH EVERY 8 HOURS AS NEEDED FOR NAUSEA AND VOMITING Patient taking differently: Take 4 mg by mouth every 8 (eight) hours as needed for nausea or vomiting.  07/14/19  Yes Mahala Menghini, PA-C  pantoprazole (PROTONIX) 40 MG tablet TAKE 1 TABLET BY MOUTH TWICE DAILY 30 MINUTES BEFORE BREAKFAST Patient taking differently: Take 40 mg by mouth daily.  07/10/19  Yes Annitta Needs, NP  warfarin (COUMADIN) 2.5 MG tablet TAKE 1 & 1/2 (ONE & ONE-HALF) TABLETS DAILY EXCEPT 1 TABLET ON TUESDAYS AND SATURDAYS OR AS DIRECTED Patient taking differently: Take 2.5-5 mg by mouth daily at 6 PM. TAKE 1 & 1/2 (ONE & ONE-HALF) TABLETS DAILY EXCEPT 1 TABLET ON TUESDAYS AND SATURDAYS OR AS DIRECTED  Per family, patient had a dosing guide that has recently ran out and was supposed to have warfarin adjusted - patient last had warfarin 2.5mg  filled on 07/28/19 with dosing per pharmacy records that indicated that patient would take 3.75mg  every day except on tuesdays and saturdays, when patient would take 2.5mg . Family stated that warfarin was to be adjusted that patient takes 5mg  on certain days but unsure of day. However, family did state that patient took 5mg  on 08/02/2019 at 1800 07/28/19  Yes Branch, Alphonse Guild, MD  amLODipine (NORVASC) 10 MG tablet Take 1 tablet by mouth once daily Patient taking differently: Take 10 mg by mouth daily. Jory Sims, Fri 05/12/19   Pixie Casino, MD  gabapentin (NEURONTIN) 300 MG capsule Take 2 capsules (600 mg total) by mouth at bedtime. 11/18/17   Mahala Menghini, PA-C  gabapentin (NEURONTIN) 300 MG capsule TAKE 2 CAPSULES BY MOUTH ONCE DAILY AT NIGHT 01/30/19   Doree Albee, MD  NP THYROID 120 MG tablet Take 120 mg by mouth daily. 08/28/18    [provider]  NP THYROID 120 MG tablet Take 1 tablet by mouth once daily 06/16/19   Hurshel Party C, MD    Current Medications Scheduled Meds: . calcium gluconate      . [START ON 08/04/2019] Chlorhexidine Gluconate Cloth  6 each Topical Q0600   Continuous Infusions: PRN Meds:.acetaminophen  CBC Recent Labs  Lab 08/01/19 1249 08/03/19 1120  WBC 13.9* 8.2  NEUTROABS 12.1* 7.2  HGB 9.4* 9.9*  HCT 32.3* 34.8*  MCV 107.3* 108.8*  PLT 224 253  Basic Metabolic Panel Recent Labs  Lab 08/01/19 1249 08/03/19 1120  NA 137 135  K 4.1 5.7*  CL 98 96*  CO2 26 19*  GLUCOSE 151* 187*  BUN 16 40*  CREATININE 3.93* 7.13*  CALCIUM 8.6* 8.1*    Physical Exam  Blood pressure (!) 135/49, pulse (!) 42, resp. rate 16, height 5\' 5"  (1.651 m), weight 72.6 kg, SpO2 94 %. GEN: Chronically ill-appearing, lying on stretcher, complaining of pain in the upper back, NAD ENT: NCAT EYES: EOMI CV: Bradycardic, regular, normal S1-S2, no rub PULM: Diminished throughout, no crackles, normal work of breathing ABD: Soft, obese, nontender SKIN: No rashes or lesions EXT: No peripheral edema VASCULAR: RUE AVF with bruit and thrill   Assessment 24F ESRD MWF AVF presenting with confusion likely related narcotics, persistent bradycardia with junctional rhythm requiring calcium and atropine, mild hyperkalemia, missed dialysis today.  1. Hyperkalemia, mild; will plan for dialysis today 2. ESRD, MWF, RUE AVF, [name] 3. Confusion/AMS/encephalopathy: Likely narcotic related, improved with Narcan 4. Recent fall with T6 and T7 compression fractures, acutely painful 5. Bradycardia, junctional at times, blood pressure stable 6. Metabolic acidosis, should improve with dialysis 7. Anemia, hemoglobin in nines, asymptomatic 8. Hypoxia, chest x-ray with some vascular congestion  Plan 1. I do not believe that her mild hyperkalemia is the primary determinant of her bradycardia; nevertheless I  think we need to correct this with dialysis here today. 2. We will also attempt gentle ultrafiltration, carefully monitoring blood pressure.  Rexene Agent  [phone] 08/03/2019, 4:38 PM

## 2019-08-03 NOTE — ED Triage Notes (Signed)
Pt brought in from home by ems after family called for being unresponsive. Pt was given Narcan 2mg  nasally by ems with improvement in mental status. Pt found to be bradycardic and hypoxic. Pt reports she felt sob this morning. Pt is alert.

## 2019-08-03 NOTE — H&P (Signed)
History and Physical    [name] FBP:[id number] DOB: 09-04-1940 DOA: 08/03/2019  Referring MD/NP/PA: Dr. Tomi Bamberger PCP: Doree Albee, MD  Patient coming from: home  Chief Complaint: AMS/episode of unresponsiveness   HPI: [name] is a 79 y.o. female with PMH significant for type 2 diabetes with nephropathy, A, fib (on coumadin), HTN, chronic diastolic HF, GERD, ESRD (M-W-F schedule), mixed HLD, hypothyroidism and recent ED visit after mechanical fall with findings of T6-T7 compression fracture (08/01/19); who was brought to ED by EMS secondary to episode of unresponsiveness and AMS. Patient with partial resolution to her presenting symptoms after Narcan given by EMS, but found with hyperkalemia, new requirement of oxygen supplementation and significantly bradycardic. Patient denies CP, fever, chills, hematochezia, melena, abd pain, blurred vision, skipping beat sensations or focal weakness.  BP wass stable and CXR demonstrated mild vascular congestion. Calcium gluconate and lokelma given in ED along with atropine. Cardiology and nephrology consulted. [facility] contacted to admit patient for further evaluation and management.  Of note, patient is complaining  Of non radiating mid-back pain, 6-7/10 in intensity pain.   Past Medical/Surgical History: Past Medical History:  Diagnosis Date  . Anemia   . Arthritis   . Blood transfusion without reported diagnosis   . CAD (coronary artery disease)    a. 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  . Cataract   . Chronic anemia   . Chronic combined systolic and diastolic CHF (congestive heart failure) ([place])    a. 2D echo 08/2016 at [facility]: EF 50-55% with inferior wall HK, impaired LV filling, fair study.  . CKD (chronic kidney disease), stage III   . Diabetes ([place])   . Gastritis   . GERD (gastroesophageal reflux disease)   . Gout   . Headache   . HTN (hypertension)   . Hyperlipidemia   . Hypothyroidism   . Iron  deficiency anemia 11/08/2016  . Normocytic anemia 10/26/2016  . NSTEMI (non-ST elevated myocardial infarction) ([place])    a. Complex admission 08/2016 - with severe hyperglycemia, AKI on CKD, severe anemia down to Hgb 6.8, acute combined CHF, troponin of 8.5, cath deferred due to renal dysfunction.  . Thyroid disease   . Wears dentures     Past Surgical History:  Procedure Laterality Date  . BASCILIC VEIN TRANSPOSITION Right 06/12/2018   Procedure: FIRST STAGE BASCILIC VEIN TRANSPOSITION RIGHT ARM;  Surgeon: Rosetta Posner, MD;  Location: [place];  Service: Vascular;  Laterality: Right;  . BASCILIC VEIN TRANSPOSITION Right 08/07/2018   Procedure: BASCILIC VEIN TRANSPOSITION SECOND STAGE RIGHT ARM;  Surgeon: Rosetta Posner, MD;  Location: [place];  Service: Vascular;  Laterality: Right;  . CATARACT EXTRACTION     left  . COLONOSCOPY WITH PROPOFOL N/A 10/08/2016   5 mm transverse colon polyp note resected due to plavix. hemorrhoids  . CORONARY ARTERY BYPASS GRAFT N/A 11/20/2018   Procedure: CORONARY ARTERY BYPASS GRAFTING (CABG), ON PUMP, TIMES four, USING LEFT INTERNAL MAMMARY ARTERY AND ENDOSCOPICALLY HARVESTED RIGHT GREATER SAPHENOUS VEIN;  Surgeon: Melrose Nakayama, MD;  Location: [place];  Service: Open Heart Surgery;  Laterality: N/A;  . ENTEROSCOPY N/A 08/08/2017   Procedure: ENTEROSCOPY;  Surgeon: Daneil Dolin, MD;  [name];  Service: Endoscopy;  Laterality: N/A;  . ESOPHAGOGASTRODUODENOSCOPY N/A 10/06/2016   mild chroni gastritis, negative H.pylori  . ESOPHAGOGASTRODUODENOSCOPY (EGD) WITH PROPOFOL N/A 03/12/2017   mild edema/erythema of stomach, small bowel biopsy with focal villous tip  lymphocytosis, ?partially developed celiac  . 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 GIVENS CAPSULE STUDY  10/08/2016   normal  . GIVENS CAPSULE STUDY N/A 10/29/2016   occasional gastric erosion, unremarkable small bowel  . LEFT HEART CATH AND CORONARY ANGIOGRAPHY N/A 11/12/2018   Procedure: LEFT HEART CATH AND CORONARY ANGIOGRAPHY;  Surgeon: Burnell Blanks, MD;  Location: [place] [facility];  Service: Cardiovascular;  Laterality: N/A;  . ORIF HUMERUS FRACTURE Left 02/07/2015   Procedure: OPEN REDUCTION INTERNAL FIXATION (ORIF) PROXIMAL HUMERUS FRACTURE;  Surgeon: Marybelle Killings, MD;  Location: [place];  Service: Orthopedics;  Laterality: Left;  . TEE WITHOUT CARDIOVERSION N/A 11/20/2018   Procedure: TRANSESOPHAGEAL ECHOCARDIOGRAM (TEE);  Surgeon: Melrose Nakayama, MD;  Location: [place];  Service: Open Heart Surgery;  Laterality: N/A;  . teeth extractions    . THYROID SURGERY    . TOTAL HIP ARTHROPLASTY Left 02/07/2015   Procedure: TOTAL HIP ARTHROPLASTY ANTERIOR APPROACH ;  Surgeon: Marybelle Killings, MD;  Location: [place];  Service: Orthopedics;  Laterality: Left;  . WRIST SURGERY Left     Social History:  reports that she has never smoked. She has never used smokeless tobacco. She reports that she does not drink alcohol or use drugs.  Allergies: Allergies  Allergen Reactions  . Insulin Glargine Swelling    "Makes me swell like a balloon all over", including face, but without any respiratory distress or rashes. Associated with weight gain.  . Statins Other (See Comments)    "I've tried them all; my muscle aches were so bad I couldn't walk".    Family History:  Family History  Problem Relation Age of Onset  . Diabetes Mother   . Asthma Mother   . Early death Mother 89       Pneumonia  . Early death Father        Killed at rodeo  . CAD Neg Hx   . GI Bleed Neg Hx     Prior to Admission medications   Medication Sig Start Date End Date Taking? Authorizing Provider  acetaminophen (TYLENOL) 325 MG tablet Take 2 tablets (650 mg total) by mouth  every 6 (six) hours as needed for mild pain or headache. 11/28/18  Yes Lars Pinks M, PA-C  amiodarone (PACERONE) 200 MG tablet Take 1 tablet (200 mg total) by mouth daily. 01/28/19  Yes Arnoldo Lenis, MD  aspirin EC 81 MG EC tablet Take 1 tablet (81 mg total) by mouth daily. 11/28/18  Yes Lars Pinks M, PA-C  calcium acetate (PHOSLO) 667 MG capsule Take 667-2,001 mg by mouth in the morning, at noon, in the evening, and at bedtime. Take 3 capsules with each meal and 1 capsule with snacks daily   Yes [provider]  Cholecalciferol (VITAMIN D-3) 125 MCG (5000 UT) TABS Take 5,000 mg by mouth daily.    Yes [provider]  Evolocumab (REPATHA SURECLICK) 956 MG/ML SOAJ Inject 140 mg into the skin every 14 (fourteen) days. 01/06/19  Yes BranchAlphonse Guild, MD  febuxostat (ULORIC) 40 MG tablet Take 1 tablet by mouth once daily 06/16/19  Yes Gosrani, Nimish C, MD  gabapentin (NEURONTIN) 300 MG capsule Take 1 capsule by mouth 2 (two) times daily as needed.  06/12/19  Yes [provider]  HYDROcodone-acetaminophen (  NORCO/VICODIN) 5-325 MG tablet Take 1 tablet by mouth every 6 (six) hours as needed for severe pain. 08/01/19  Yes Joy, Shawn C, PA-C  metoprolol succinate (TOPROL XL) 25 MG 24 hr tablet Take 0.5 tablets (12.5 mg total) by mouth daily. Patient taking differently: Take 12.5 mg by mouth every Monday, Wednesday, and Friday. Monday Wednesday Friday 11/28/18 11/28/19 Yes Lars Pinks M, PA-C  Multiple Vitamin (MULTIVITAMIN WITH MINERALS) TABS tablet Take 1 tablet by mouth daily.   Yes [provider]  NP THYROID 120 MG tablet Take 1 tablet by mouth once daily 07/22/19 08/21/19 Yes Gosrani, Nimish C, MD  ondansetron (ZOFRAN) 4 MG tablet TAKE 1 TABLET BY MOUTH EVERY 8 HOURS AS NEEDED FOR NAUSEA AND VOMITING Patient taking differently: Take 4 mg by mouth every 8 (eight) hours as needed for nausea or vomiting.  07/14/19  Yes Mahala Menghini, PA-C  pantoprazole  (PROTONIX) 40 MG tablet TAKE 1 TABLET BY MOUTH TWICE DAILY 30 MINUTES BEFORE BREAKFAST Patient taking differently: Take 40 mg by mouth daily.  07/10/19  Yes Annitta Needs, NP  warfarin (COUMADIN) 2.5 MG tablet TAKE 1 & 1/2 (ONE & ONE-HALF) TABLETS DAILY EXCEPT 1 TABLET ON TUESDAYS AND SATURDAYS OR AS DIRECTED Patient taking differently: Take 2.5-5 mg by mouth daily at 6 PM. TAKE 1 & 1/2 (ONE & ONE-HALF) TABLETS DAILY EXCEPT 1 TABLET ON TUESDAYS AND SATURDAYS OR AS DIRECTED  Per family, patient had a dosing guide that has recently ran out and was supposed to have warfarin adjusted - patient last had warfarin 2.5mg  filled on 07/28/19 with dosing per pharmacy records that indicated that patient would take 3.75mg  every day except on tuesdays and saturdays, when patient would take 2.5mg . Family stated that warfarin was to be adjusted that patient takes 5mg  on certain days but unsure of day. However, family did state that patient took 5mg  on 08/02/2019 at 1800 07/28/19  Yes Branch, Alphonse Guild, MD  amLODipine (NORVASC) 10 MG tablet Take 1 tablet by mouth once daily Patient taking differently: Take 10 mg by mouth daily. Jory Sims, Fri 05/12/19   Pixie Casino, MD  gabapentin (NEURONTIN) 300 MG capsule Take 2 capsules (600 mg total) by mouth at bedtime. 11/18/17   Mahala Menghini, PA-C  gabapentin (NEURONTIN) 300 MG capsule TAKE 2 CAPSULES BY MOUTH ONCE DAILY AT NIGHT 01/30/19   Doree Albee, MD  NP THYROID 120 MG tablet Take 120 mg by mouth daily. 08/28/18   [provider]  NP THYROID 120 MG tablet Take 1 tablet by mouth once daily 06/16/19   Doree Albee, MD    Review of Systems:  Negative except as mentioned on HPI.   Physical Exam: Vitals:   08/03/19 1645 08/03/19 1650 08/03/19 1700 08/03/19 1730  BP:   (!) 126/40 (!) 124/49  Pulse: (!) 43 (!) 40 (!) 40 (!) 41  Resp: 12 (!) 21 10 15   TempSrc:      SpO2: 93% 92% (!) 88% 96%  Weight:      Height:        Constitutional: no fever,  no CP, no nausea or vomiting. Complaining of back pain. Eyes: PERRL, lids and conjunctivae normal ENMT: Mucous membranes are moist. Posterior pharynx clear of any exudate or lesions. Neck: normal, supple, no masses, no thyromegaly Respiratory: decrease Bs at the bases, no frank crackles, no wheezing. No accessory muscle use.  Cardiovascular: irregular, positive bradycardia. No carotid bruits. Trace edema bilaterally   Abdomen:  no tenderness, no masses palpated. No hepatosplenomegaly. Bowel sounds positive.  Musculoskeletal: no clubbing / cyanosis. No joint deformity upper and lower extremities. Good ROM, no contractures. Normal muscle tone.  Skin: no rashes, no petechiae.  Neurologic: CN 2-12 grossly intact. Sensation intact, DTR normal. Strength 5/5 in all 4.  Psychiatric: Normal judgment and insight. Alert and oriented x 3. Normal mood.    Labs on Admission: I have personally reviewed the following labs and imaging studies  CBC: Recent Labs  Lab 08/01/19 1249 08/03/19 1120  WBC 13.9* 8.2  NEUTROABS 12.1* 7.2  HGB 9.4* 9.9*  HCT 32.3* 34.8*  MCV 107.3* 108.8*  PLT 224 160   Basic Metabolic Panel: Recent Labs  Lab 08/01/19 1249 08/03/19 1120  NA 137 135  K 4.1 5.7*  CL 98 96*  CO2 26 19*  GLUCOSE 151* 187*  BUN 16 40*  CREATININE 3.93* 7.13*  CALCIUM 8.6* 8.1*   GFR: Estimated Creatinine Clearance: 6.4 mL/min (A) (by C-G formula based on SCr of 7.13 mg/dL (H)).  Coagulation Profile: Recent Labs  Lab 08/01/19 1249  INR 1.3*   Urine analysis:    Component Value Date/Time   COLORURINE AMBER (A) 05/19/2019 0919   APPEARANCEUR HAZY (A) 05/19/2019 0919   LABSPEC 1.016 05/19/2019 0919   PHURINE 5.0 05/19/2019 0919   GLUCOSEU NEGATIVE 05/19/2019 0919   HGBUR NEGATIVE 05/19/2019 0919   BILIRUBINUR NEGATIVE 05/19/2019 0919   KETONESUR NEGATIVE 05/19/2019 0919   PROTEINUR 100 (A) 05/19/2019 0919   UROBILINOGEN 0.2 02/08/2015 1615   NITRITE NEGATIVE 05/19/2019 0919     LEUKOCYTESUR NEGATIVE 05/19/2019 0919    Recent Results (from the past 240 hour(s))  Blood culture (routine x 2)     Status: None (Preliminary result)   Collection Time: 08/03/19 11:21 AM   Specimen: BLOOD LEFT HAND  Result Value Ref Range Status   Specimen Description BLOOD LEFT HAND  Final   Special Requests   Final    BOTTLES DRAWN AEROBIC ONLY Blood Culture results may not be optimal due to an inadequate volume of blood received in culture bottles Performed at [facility], [address] [place], [place] [address]    Culture PENDING  Incomplete   Report Status PENDING  Incomplete  Blood culture (routine x 2)     Status: None (Preliminary result)   Collection Time: 08/03/19  1:22 PM   Specimen: BLOOD RIGHT ARM  Result Value Ref Range Status   Specimen Description BLOOD RIGHT ARM  Final   Special Requests   Final    BOTTLES DRAWN AEROBIC AND ANAEROBIC Blood Culture adequate volume Performed at [facility], [address] [place], [place] [address]    Culture PENDING  Incomplete   Report Status PENDING  Incomplete     Radiological Exams on Admission: DG Chest Portable 1 View  Result Date: 08/03/2019 CLINICAL DATA:  Altered level of consciousness, bradycardia EXAM: PORTABLE CHEST 1 VIEW COMPARISON:  08/01/2019 FINDINGS: Cardiomegaly. Prior CABG. Elevation of the left hemidiaphragm with left base atelectasis and small left effusion. No confluent opacity on the right. Mild vascular congestion. IMPRESSION: Mild cardiomegaly, vascular congestion. Elevation of the left hemidiaphragm with left base atelectasis and small left effusion. Electronically Signed   By: Rolm Baptise M.D.   On: 08/03/2019 11:58    EKG: Independently reviewed. Bradycardia with junctional rhythm.  Assessment/Plan 1-episode of unresponsiveness/metabolic encephalopathy -With concern for medication induced (recently started on narcotics) versus development of tachybradycardia syndrome. -Will minimize agents  that can  produce somnolence or altered mentation -Will check TSH -Correct electrolytes abnormalities -follow clinical response -no signs of acute infection appreciated.  2-atrial fibrillation: With presentation of bradycardia and intermittent junctional rhythm. -Heart rate in the high 30s low 40s currently; hemodynamically stable otherwise and maintaining good mentation. -Patient admitted for close monitoring of telemetry on a stepdown bed and with external pacers at bedside. -Check TSH and correct electrolytes -Whole calcium channel blockers, beta-blockers and amiodarone -Continue Coumadin per pharmacy for secondary prevention -Cardiology service consulted. -Will check 2D echo. -Mild elevation of her troponin, in a patient with ESRD which appears to be relevant especially without chest pain.  3-hypoxemia  -No prior history of oxygen requirements -CXR demonstrating mild vascular congestion. -volume to be optimized with HD later today.  4-ESRD with mild hyperkalemia -lokelma dn calcium gluconate given in the ED -HD later today per nephrology recommendations -patient on M-W-F schedule. -renal diet ordered  5-Mixed hyperlipidemia -continue outpatient weekly injections of Repatha  6-Essential hypertension, benign -BP stable currently -will monitor VS -holding antihypertensive agents in effort to minimize effects on av node. -if needed will use PRN hydralazine.  7-acute on Chronic diastolic HF (heart failure) [facility]): with mild vascular congestion seen on CXR -follow daily weights and strict intake and output  -continue volume mangement with HD -continue oxygen supplementation and wean off as tolerated.  8-type 2 diabetes with nephropathy/neuropathy -currently following diet controlled at home only -will monitor CBG's and if needed start SSI -check A1C  9-GERD -continue PPI  10-back pain/osteoporosis: T6-T7 compression fracture -non-displaced -will use tylenol and tramadol  PRN -minimize stronger narcotics, as patient had transient episode of unresponsiveness prior to admission after using narcotics. Good response to Narcan  -will check Vit D level  11-hypothyroidism -check TSH -continue NP thyroid.   DVT prophylaxis: Chronically on Coumadin. Code Status: Full code Family Communication: No family at bedside. Disposition Plan: Anticipate discharge back home once heart rate and overall electrolytes abnormalities stabilized.  Patient due for hemodialysis after missing treatment today, presenting with some vascular congestion and hyperkalemia. Also noticed to be in bradycardia with escaped junctional rhythm at time.  Consults called: Nephrology service (Dr. Joelyn Oms) and cardiology service (Dr. Domenic Polite) Admission status: Observation, stepdown bed; length of stay less than 2 midnights.  If patient fails to improve and ended requiring further intervention or a stabilization and past 2 midnight stay at her status will be adjusted to inpatient.   Time Spent: 70 minutes.  Barton Dubois MD [facility]  08/03/2019, 5:42 PM

## 2019-08-03 NOTE — ED Notes (Signed)
Dr Dyann Kief notified of heart rate in high 30's/low 40 range.

## 2019-08-03 NOTE — Progress Notes (Signed)
[name] paged to see if pts home dose of Gabapentin could be ordered. Pt requesting medication. Waiting for call back/orders. Will continue to monitor pt

## 2019-08-03 NOTE — ED Notes (Signed)
Date and time results received: 08/03/19 1825 (use smartphrase ".now" to insert current time)  Test: INR Critical Value: 4.3  Name of Provider Notified: [place],MD  Orders Received? Or Actions Taken?:

## 2019-08-03 NOTE — ED Notes (Signed)
Family updated as to patient's status.

## 2019-08-04 ENCOUNTER — Observation Stay (HOSPITAL_COMMUNITY): Payer: Medicare HMO

## 2019-08-04 DIAGNOSIS — E1122 Type 2 diabetes mellitus with diabetic chronic kidney disease: Secondary | ICD-10-CM | POA: Diagnosis present

## 2019-08-04 DIAGNOSIS — Z20822 Contact with and (suspected) exposure to covid-19: Secondary | ICD-10-CM | POA: Diagnosis present

## 2019-08-04 DIAGNOSIS — Z992 Dependence on renal dialysis: Secondary | ICD-10-CM | POA: Diagnosis not present

## 2019-08-04 DIAGNOSIS — I25119 Atherosclerotic heart disease of native coronary artery with unspecified angina pectoris: Secondary | ICD-10-CM | POA: Diagnosis not present

## 2019-08-04 DIAGNOSIS — N189 Chronic kidney disease, unspecified: Secondary | ICD-10-CM | POA: Diagnosis not present

## 2019-08-04 DIAGNOSIS — I340 Nonrheumatic mitral (valve) insufficiency: Secondary | ICD-10-CM | POA: Diagnosis not present

## 2019-08-04 DIAGNOSIS — R4189 Other symptoms and signs involving cognitive functions and awareness: Secondary | ICD-10-CM | POA: Diagnosis not present

## 2019-08-04 DIAGNOSIS — I5032 Chronic diastolic (congestive) heart failure: Secondary | ICD-10-CM | POA: Diagnosis not present

## 2019-08-04 DIAGNOSIS — R001 Bradycardia, unspecified: Secondary | ICD-10-CM | POA: Diagnosis present

## 2019-08-04 DIAGNOSIS — I252 Old myocardial infarction: Secondary | ICD-10-CM | POA: Diagnosis not present

## 2019-08-04 DIAGNOSIS — E872 Acidosis: Secondary | ICD-10-CM | POA: Diagnosis present

## 2019-08-04 DIAGNOSIS — G92 Toxic encephalopathy: Secondary | ICD-10-CM | POA: Diagnosis present

## 2019-08-04 DIAGNOSIS — Z888 Allergy status to other drugs, medicaments and biological substances status: Secondary | ICD-10-CM | POA: Diagnosis not present

## 2019-08-04 DIAGNOSIS — Z8616 Personal history of COVID-19: Secondary | ICD-10-CM | POA: Diagnosis not present

## 2019-08-04 DIAGNOSIS — S22000G Wedge compression fracture of unspecified thoracic vertebra, subsequent encounter for fracture with delayed healing: Secondary | ICD-10-CM | POA: Diagnosis not present

## 2019-08-04 DIAGNOSIS — E782 Mixed hyperlipidemia: Secondary | ICD-10-CM | POA: Diagnosis present

## 2019-08-04 DIAGNOSIS — I480 Paroxysmal atrial fibrillation: Secondary | ICD-10-CM | POA: Diagnosis present

## 2019-08-04 DIAGNOSIS — L89302 Pressure ulcer of unspecified buttock, stage 2: Secondary | ICD-10-CM | POA: Diagnosis present

## 2019-08-04 DIAGNOSIS — E875 Hyperkalemia: Secondary | ICD-10-CM | POA: Diagnosis present

## 2019-08-04 DIAGNOSIS — I5043 Acute on chronic combined systolic (congestive) and diastolic (congestive) heart failure: Secondary | ICD-10-CM | POA: Diagnosis present

## 2019-08-04 DIAGNOSIS — N186 End stage renal disease: Secondary | ICD-10-CM | POA: Diagnosis present

## 2019-08-04 DIAGNOSIS — W1830XA Fall on same level, unspecified, initial encounter: Secondary | ICD-10-CM | POA: Diagnosis present

## 2019-08-04 DIAGNOSIS — E118 Type 2 diabetes mellitus with unspecified complications: Secondary | ICD-10-CM | POA: Diagnosis not present

## 2019-08-04 DIAGNOSIS — Z951 Presence of aortocoronary bypass graft: Secondary | ICD-10-CM | POA: Diagnosis not present

## 2019-08-04 DIAGNOSIS — Y92009 Unspecified place in unspecified non-institutional (private) residence as the place of occurrence of the external cause: Secondary | ICD-10-CM | POA: Diagnosis not present

## 2019-08-04 DIAGNOSIS — I2510 Atherosclerotic heart disease of native coronary artery without angina pectoris: Secondary | ICD-10-CM | POA: Diagnosis present

## 2019-08-04 DIAGNOSIS — T402X5A Adverse effect of other opioids, initial encounter: Secondary | ICD-10-CM | POA: Diagnosis present

## 2019-08-04 DIAGNOSIS — L89312 Pressure ulcer of right buttock, stage 2: Secondary | ICD-10-CM

## 2019-08-04 DIAGNOSIS — Z7901 Long term (current) use of anticoagulants: Secondary | ICD-10-CM | POA: Diagnosis not present

## 2019-08-04 DIAGNOSIS — M4854XA Collapsed vertebra, not elsewhere classified, thoracic region, initial encounter for fracture: Secondary | ICD-10-CM | POA: Diagnosis present

## 2019-08-04 DIAGNOSIS — N2581 Secondary hyperparathyroidism of renal origin: Secondary | ICD-10-CM | POA: Diagnosis present

## 2019-08-04 DIAGNOSIS — K219 Gastro-esophageal reflux disease without esophagitis: Secondary | ICD-10-CM | POA: Diagnosis present

## 2019-08-04 DIAGNOSIS — S22000K Wedge compression fracture of unspecified thoracic vertebra, subsequent encounter for fracture with nonunion: Secondary | ICD-10-CM | POA: Diagnosis not present

## 2019-08-04 DIAGNOSIS — I132 Hypertensive heart and chronic kidney disease with heart failure and with stage 5 chronic kidney disease, or end stage renal disease: Secondary | ICD-10-CM | POA: Diagnosis present

## 2019-08-04 DIAGNOSIS — I4891 Unspecified atrial fibrillation: Secondary | ICD-10-CM | POA: Diagnosis not present

## 2019-08-04 DIAGNOSIS — I361 Nonrheumatic tricuspid (valve) insufficiency: Secondary | ICD-10-CM | POA: Diagnosis not present

## 2019-08-04 DIAGNOSIS — I4821 Permanent atrial fibrillation: Secondary | ICD-10-CM | POA: Diagnosis not present

## 2019-08-04 DIAGNOSIS — E039 Hypothyroidism, unspecified: Secondary | ICD-10-CM | POA: Diagnosis present

## 2019-08-04 DIAGNOSIS — I10 Essential (primary) hypertension: Secondary | ICD-10-CM | POA: Diagnosis not present

## 2019-08-04 LAB — CBC
HCT: 28.3 % — ABNORMAL LOW (ref 36.0–46.0)
Hemoglobin: 8.6 g/dL — ABNORMAL LOW (ref 12.0–15.0)
MCH: 30.6 pg (ref 26.0–34.0)
MCHC: 30.4 g/dL (ref 30.0–36.0)
MCV: 100.7 fL — ABNORMAL HIGH (ref 80.0–100.0)
Platelets: 233 10*3/uL (ref 150–400)
RBC: 2.81 MIL/uL — ABNORMAL LOW (ref 3.87–5.11)
RDW: 18.6 % — ABNORMAL HIGH (ref 11.5–15.5)
WBC: 7.9 10*3/uL (ref 4.0–10.5)
nRBC: 0 % (ref 0.0–0.2)

## 2019-08-04 LAB — BASIC METABOLIC PANEL
Anion gap: 15 (ref 5–15)
BUN: 15 mg/dL (ref 8–23)
CO2: 27 mmol/L (ref 22–32)
Calcium: 7.9 mg/dL — ABNORMAL LOW (ref 8.9–10.3)
Chloride: 91 mmol/L — ABNORMAL LOW (ref 98–111)
Creatinine, Ser: 3.25 mg/dL — ABNORMAL HIGH (ref 0.44–1.00)
GFR calc Af Amer: 15 mL/min — ABNORMAL LOW (ref >60)
GFR calc non Af Amer: 13 mL/min — ABNORMAL LOW (ref >60)
Glucose, Bld: 88 mg/dL (ref 70–99)
Potassium: 3.8 mmol/L (ref 3.5–5.1)
Sodium: 133 mmol/L — ABNORMAL LOW (ref 135–145)

## 2019-08-04 LAB — MRSA PCR SCREENING: MRSA by PCR: NEGATIVE

## 2019-08-04 LAB — ECHOCARDIOGRAM COMPLETE
Height: 64 in
Weight: 2631.41 oz

## 2019-08-04 LAB — HEMOGLOBIN A1C
Hgb A1c MFr Bld: 4.9 % (ref 4.8–5.6)
Mean Plasma Glucose: 93.93 mg/dL

## 2019-08-04 LAB — PROTIME-INR
INR: 3.6 — ABNORMAL HIGH (ref 0.8–1.2)
Prothrombin Time: 35.7 seconds — ABNORMAL HIGH (ref 11.4–15.2)

## 2019-08-04 LAB — GLUCOSE, CAPILLARY
Glucose-Capillary: 112 mg/dL — ABNORMAL HIGH (ref 70–99)
Glucose-Capillary: 89 mg/dL (ref 70–99)
Glucose-Capillary: 87 mg/dL (ref 70–99)
Glucose-Capillary: 83 mg/dL (ref 70–99)
Glucose-Capillary: 74 mg/dL (ref 70–99)

## 2019-08-04 LAB — HEPATITIS B SURFACE ANTIGEN: Hepatitis B Surface Ag: NONREACTIVE

## 2019-08-04 MED ORDER — POLYETHYLENE GLYCOL 3350 17 G PO PACK
17.0000 g | PACK | Freq: Every day | ORAL | Status: DC | PRN
  Administered 2019-08-06: 17 g via ORAL
  Filled 2019-08-04: qty 1

## 2019-08-04 MED ORDER — SENNOSIDES-DOCUSATE SODIUM 8.6-50 MG PO TABS
1.0000 | ORAL_TABLET | Freq: Every day | ORAL | Status: DC
  Administered 2019-08-04 – 2019-08-13 (×9): 1 via ORAL
  Filled 2019-08-04 (×9): qty 1

## 2019-08-04 MED ORDER — LIDOCAINE 5 % EX PTCH
1.0000 | MEDICATED_PATCH | CUTANEOUS | Status: DC
  Administered 2019-08-04 – 2019-08-14 (×10): 1 via TRANSDERMAL
  Filled 2019-08-04 (×9): qty 1

## 2019-08-04 MED ORDER — TRAMADOL HCL 50 MG PO TABS
50.0000 mg | ORAL_TABLET | Freq: Two times a day (BID) | ORAL | Status: DC | PRN
  Administered 2019-08-05 – 2019-08-06 (×2): 50 mg via ORAL
  Filled 2019-08-04 (×2): qty 1

## 2019-08-04 MED ORDER — DARBEPOETIN ALFA 60 MCG/0.3ML IJ SOSY
60.0000 ug | PREFILLED_SYRINGE | INTRAMUSCULAR | Status: DC
  Administered 2019-08-05: 60 ug via INTRAVENOUS
  Filled 2019-08-04 (×3): qty 0.3

## 2019-08-04 MED ORDER — ORAL CARE MOUTH RINSE
15.0000 mL | Freq: Two times a day (BID) | OROMUCOSAL | Status: DC
  Administered 2019-08-04 – 2019-08-14 (×20): 15 mL via OROMUCOSAL

## 2019-08-04 MED ORDER — FENTANYL CITRATE (PF) 100 MCG/2ML IJ SOLN
50.0000 ug | INTRAMUSCULAR | Status: DC | PRN
  Administered 2019-08-04 – 2019-08-06 (×6): 50 ug via INTRAVENOUS
  Filled 2019-08-04 (×7): qty 2

## 2019-08-04 NOTE — Procedures (Signed)
     HEMODIALYSIS TREATMENT NOTE:  3.5 hour heparin-free dialysis treatment completed via right upper arm AVF (15g/antegade). Goal met: 2 liters removed without interruption in ultrafiltration.  All blood was returned and hemostasis was achieved in 20 minutes.  Rockwell Alexandria, RN

## 2019-08-04 NOTE — Progress Notes (Signed)
Pt transported to floor via bed and accompanied by ICU techs. She is alert and oriented and has complaint of mid-low back pain secondary to compression fx sustained during fall at home. Lidocaine patch is placed. Bed is in low and locked position.

## 2019-08-04 NOTE — TOC Initial Note (Signed)
Transition of Care [facility]) - Initial/Assessment Note    Patient Details  Name: [name] MRN: [id number] Date of Birth: May 04, 1941  Transition of Care [facility]) CM/SW Contact:    Sherie Don, LCSW Phone Number: 08/04/2019, 3:05 PM  Clinical Narrative: Patient is a 79 year old female with history of type 2 diabetes with nephropathy, Afib, HTN, chronic diastolic HF, GERD, ESRD, mixed HLD, and hypothyroidism. Patient was brought to ED by EMS due to episode of unresponsiveness and AMS. CSW called patient's friend, Shirlean Schlein, who is the primary contact for patient's health needs as well as patient's son, Jacqulene Huntley, to discuss patient being referred for SNF placement per PT recommendations. Both son and patient were agreeable to SNF placement and provided verbal permission for referral to be sent. Friend and son requested a SNF placement in or near [place] as that is where the patient resides. FL2 was completed. Referral sent for SNF placement.    Expected Discharge Plan: Skilled Nursing Facility Barriers to Discharge: Continued Medical Work up   Patient Goals and CMS Choice Patient states their goals for this hospitalization and ongoing recovery are:: Transfer to SNF and then return home CMS Medicare.gov Compare Post Acute Care list provided to:: Patient Represenative (must comment)(Rick Widener (son) [place]: [phone]; Shirlean Schlein (friend) [place]: [phone]) Choice offered to / list presented to : Adult Children(Also offered to Shirlean Schlein (friend))  Expected Discharge Plan and Services Expected Discharge Plan: [place] arrangements for the past 2 months: Single Family Home                  Prior Living Arrangements/Services Living arrangements for the past 2 months: Single Family Home   Patient language and need for interpreter reviewed:: Yes Do you feel safe going back to the place where you live?: Yes      Need for Family Participation in Patient Care: Yes  (Comment)(Patient has episodes of unresponsiveness) Care giver support system in place?: Yes (comment)(Bob Nason (friend); Kathi Der (son))   Criminal Activity/Legal Involvement Pertinent to Current Situation/Hospitalization: No - Comment as needed  Activities of Daily Living Home Assistive Devices/Equipment: None ADL Screening (condition at time of admission) Patient's cognitive ability adequate to safely complete daily activities?: Yes Is the patient deaf or have difficulty hearing?: No Does the patient have difficulty seeing, even when wearing glasses/contacts?: No Does the patient have difficulty concentrating, remembering, or making decisions?: No Patient able to express need for assistance with ADLs?: Yes Does the patient have difficulty dressing or bathing?: No Independently performs ADLs?: Yes (appropriate for developmental age) Does the patient have difficulty walking or climbing stairs?: Yes Weakness of Legs: Both Weakness of Arms/Hands: None  Permission Sought/Granted    Emotional Assessment Appearance:: Appears older than stated age Attitude/Demeanor/Rapport: Unable to Assess Affect (typically observed): Unable to Assess Orientation: : Oriented to Self   Psych Involvement: No (comment)  Admission diagnosis:  Hyperkalemia [E87.5] Bradycardia [R00.1] Episode of unresponsiveness [R41.89] Chronic renal failure, unspecified CKD stage [N18.9] Patient Active Problem List   Diagnosis Date Noted  . Pressure injury of buttock, stage 2 ([place]) 08/04/2019  . Episode of unresponsiveness 08/03/2019  . Bradycardia 08/03/2019  . Cellulitis 07/02/2019  . Diabetic ulcer of right foot ([place]) 06/18/2019  . Acute hypoxemic respiratory failure due to COVID-19 ([place]) 05/19/2019  . Atrial fibrillation ([place]) 12/03/2018  . Encounter for therapeutic drug monitoring 12/03/2018  . Pressure injury of skin 11/24/2018  . CAD (coronary artery  disease) 11/21/2018  . Chronic diastolic HF (heart  failure) ([place]) 11/21/2018  . Respiratory failure with hypoxia ([place]) 11/21/2018  . End-stage renal disease on hemodialysis ([place]) 11/21/2018  . Diabetes mellitus type 2, controlled, with complications ([place]) 16/02/9603  . 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 ESRD (end stage renal disease) on dialysis ([place]) 11/12/2018  . Non-ST elevation (NSTEMI) myocardial infarction ([place]) 11/06/2018  . Polyp of transverse colon 10/08/2017  . Anemia 08/07/2017  . GI bleed 07/30/2017  . Hyperkalemia 07/30/2017  . Essential hypertension, benign 12/13/2016  . Hypothyroidism 12/13/2016  . Iron deficiency anemia 11/08/2016  . Normocytic anemia 10/26/2016  . Elevated troponin   . Aortic atherosclerosis ([place]) 10/25/2016  . Symptomatic anemia 10/04/2016  . History of non-ST elevation myocardial infarction (NSTEMI) 10/04/2016  . Mixed hyperlipidemia 10/04/2016  . Neuropathy 09/20/2016  . Chronic kidney disease, stage IV (severe) ([place]) 08/03/2015  . Fall 02/05/2015  . Hip fracture ([place]) 02/05/2015  . HTN (hypertension) 02/05/2015  . Gout 02/05/2015  . Left humeral fracture 02/05/2015  . Hyperlipidemia associated with type 2 diabetes mellitus ([place]) 05/01/2013   PCP:  Doree Albee, MD Pharmacy:   [facility] [address] [place] [address] Phone: [phone] Fax: [phone]  [facility] [address] [place] [address] [place] [address] [place] [address] Phone: [phone] Fax: [phone]  [facility] [place], [place] [address] Phone: [phone] Fax: [phone]  [id number] - [place], [place] [address] [place] [address] Phone: [phone] Fax: [phone]   Readmission Risk Interventions Readmission Risk Prevention Plan 08/04/2019 11/27/2018 11/11/2018  Transportation Screening Complete Complete Complete   Social Work Consult for [place] Planning/Counseling - - Complete  Palliative Care Screening - - Not Applicable  Medication Review Press photographer) Complete Complete -  PCP or Specialist appointment within 3-5 days of discharge Not Complete - -  PCP/Specialist Appt Not Complete comments Patient will discharge to SNF - -  [place] or [place] Not Complete Complete -  [place] or Home Care Consult Pt Refusal Comments Patient will discharge to SNF - -  SW Recovery Care/Counseling Consult Complete Complete -  Palliative Care Screening Not Complete Not Applicable -  Comments Patient discharging to SNF - -  [place] Complete Not Applicable -  Some recent data might be hidden

## 2019-08-04 NOTE — Evaluation (Signed)
Physical Therapy Evaluation Patient Details Name: [name] MRN: [id number] DOB: December 06, 1940 Today's Date: 08/04/2019   History of Present Illness  [name] is a 79 y.o. female with PMH significant for type 2 diabetes with nephropathy, A, fib (on coumadin), HTN, chronic diastolic HF, GERD, ESRD (M-W-F schedule), mixed HLD, hypothyroidism and recent ED visit after mechanical fall with findings of T6-T7 compression fracture (08/01/19); who was brought to ED by EMS secondary to episode of unresponsiveness and AMS. Patient with partial resolution to her presenting symptoms after Narcan given by EMS, but found with hyperkalemia, new requirement of oxygen supplementation and significantly bradycardic. Patient denies CP, fever, chills, hematochezia, melena, abd pain, blurred vision, skipping beat sensations or focal weakness.    Clinical Impression  Patient limited for functional mobility as stated below secondary to BLE weakness, fatigue and pain. Patient apprehensive to movement today and is mostly limited secondary to pain. Attempted to transition patient to sitting EOB with assist but while attempting to sit patient has severe back pain and wishes to return to supine. Patient able to complete hip/ knee flexion/extension, ankle PF/DF in gravity reduced position without increase in symptoms. Patient educated on continuing to move LE while in hospital and upon discharge as well as importance of physical activity. Patient asks if she can have more pain medication - RN aware.  Patient will benefit from continued physical therapy in hospital and recommended venue below to increase strength, balance, endurance for safe ADLs and gait.     Follow Up Recommendations SNF    Equipment Recommendations  None recommended by PT    Recommendations for Other Services       Precautions / Restrictions Precautions Precautions: Fall Restrictions Weight Bearing Restrictions: No      Mobility  Bed  Mobility Overal bed mobility: Needs Assistance Bed Mobility: Rolling;Supine to Sit Rolling: Mod assist   Supine to sit: Mod assist;HOB elevated     General bed mobility comments: Patient able to roll slightly but was apprehensive due to increase in pain; Patient requires assist to attempt to transition to seated but is apprehensive due to increase in pain level and wishes to return supine  Transfers                    Ambulation/Gait                Stairs            Wheelchair Mobility    Modified Rankin (Stroke Patients Only)       Balance                                             Pertinent Vitals/Pain Pain Assessment: Faces Faces Pain Scale: Hurts whole lot Pain Location: back Pain Intervention(s): Limited activity within patient's tolerance;Monitored during session;Premedicated before session;Patient requesting pain meds-RN notified    Home Living Family/patient expects to be discharged to:: Private residence Living Arrangements: Non-relatives/Friends Available Help at Discharge: Friend(s);Available 24 hours/day Type of Home: House Home Access: Stairs to enter;Elevator Entrance Stairs-Rails: Right;Left Entrance Stairs-Number of Steps: 5 back, 7 front Home Layout: One level Home Equipment: Walker - 2 wheels;Tub bench;Toilet riser;Bedside commode;Cane - single point      Prior Function Level of Independence: Independent with assistive device(s)         Comments: patient states community ambulator with RW  Hand Dominance        Extremity/Trunk Assessment   Upper Extremity Assessment Upper Extremity Assessment: Generalized weakness    Lower Extremity Assessment Lower Extremity Assessment: Generalized weakness       Communication   Communication: No difficulties  Cognition Arousal/Alertness: Awake/alert Behavior During Therapy: WFL for tasks assessed/performed Overall Cognitive Status: Within  Functional Limits for tasks assessed                                        General Comments      Exercises     Assessment/Plan    PT Assessment Patient needs continued PT services  PT Problem List Decreased strength;Decreased range of motion;Decreased activity tolerance;Decreased balance;Decreased mobility;Pain       PT Treatment Interventions DME instruction;Balance training;Gait training;Neuromuscular re-education;Stair training;Functional mobility training;Patient/family education;Therapeutic activities;Therapeutic exercise;Manual techniques;Modalities    PT Goals (Current goals can be found in the Care Plan section)  Acute Rehab PT Goals Patient Stated Goal: return home PT Goal Formulation: With patient Time For Goal Achievement: 08/18/19 Potential to Achieve Goals: Fair    Frequency Min 3X/week   Barriers to discharge        Co-evaluation               AM-PAC PT "6 Clicks" Mobility  Outcome Measure Help needed turning from your back to your side while in a flat bed without using bedrails?: A Lot Help needed moving from lying on your back to sitting on the side of a flat bed without using bedrails?: A Lot Help needed moving to and from a bed to a chair (including a wheelchair)?: A Lot Help needed standing up from a chair using your arms (e.g., wheelchair or bedside chair)?: A Lot Help needed to walk in hospital room?: A Lot Help needed climbing 3-5 steps with a railing? : A Lot 6 Click Score: 12    End of Session Equipment Utilized During Treatment: Oxygen Activity Tolerance: Patient limited by pain Patient left: in bed;with call bell/phone within reach Nurse Communication: Mobility status;Patient requests pain meds PT Visit Diagnosis: Unsteadiness on feet (R26.81);Other abnormalities of gait and mobility (R26.89);Muscle weakness (generalized) (M62.81)    Time: 7014-1030 PT Time Calculation (min) (ACUTE ONLY): 16 min   Charges:   PT  Evaluation $PT Eval Low Complexity: 1 Low PT Treatments $Therapeutic Activity: 8-22 mins        12:31 PM, 08/04/19 Mearl Latin PT, DPT Physical Therapist at [facility]

## 2019-08-04 NOTE — Progress Notes (Addendum)
Admit: 08/03/2019 LOS: 0  14F ESRD MWF AVF [name] with AMS and bradycardia  Subjective:  . HD overnight, 2L UF, tolerated well . HR in 60s this AM,  BP normalized; rec TTE . Pt stable, c/o back pain . On 2L [place]   03/08 0701 - 03/09 0700 In: 353.7 [P.O.:240; IV Piggyback:113.7] Out: 2000   Filed Weights   08/03/19 1117 08/03/19 2155 08/03/19 2211  Weight: 72.6 kg 74.6 kg 74.6 kg    Scheduled Meds: . aspirin EC  81 mg Oral Daily  . calcium acetate  667-2,001 mg Oral TID WC  . Chlorhexidine Gluconate Cloth  6 each Topical Q0600  . febuxostat  40 mg Oral Daily  . gabapentin  300 mg Oral BID  . insulin aspart  0-6 Units Subcutaneous TID WC  . mouth rinse  15 mL Mouth Rinse BID  . multivitamin with minerals  1 tablet Oral Daily  . pantoprazole  40 mg Oral Daily  . sodium chloride flush  3 mL Intravenous Q12H  . thyroid  120 mg Oral Daily  . cholecalciferol  5,000 Units Oral Daily  . Warfarin - Pharmacist Dosing Inpatient   Does not apply q1800   Continuous Infusions: . sodium chloride    . sodium chloride    . sodium chloride     PRN Meds:.sodium chloride, sodium chloride, sodium chloride, acetaminophen, lidocaine (PF), lidocaine-prilocaine, ondansetron **OR** ondansetron (ZOFRAN) IV, pentafluoroprop-tetrafluoroeth, sodium chloride flush, traMADol  Current Labs: reviewed    Physical Exam:  Blood pressure (!) 139/42, pulse 68, temperature 98 F (36.7 C), temperature source Oral, resp. rate 11, height 5\' 4"  (1.626 m), weight 74.6 kg, SpO2 94 %. GEN: Chronically ill-appearing, lying on stretcher, complaining of pain in the upper back, NAD ENT: NCAT EYES: EOMI CV: normal rate, regular, normal S1-S2, no rub PULM: Diminished throughout, no crackles, normal work of breathing ABD: Soft, obese, nontender SKIN: No rashes or lesions EXT: No peripheral edema VASCULAR: RUE AVF with bruit and thrill  A 1. Hyperkalemia, mild; resolved with HD 2. ESRD, MWF, RUE AVF, [name] 3. Confusion/AMS/encephalopathy: Improved; likely narcotic related, improved with Narcan 4. Recent fall with T6 and T7 compression fractures, acutely painful 5. AFib with Bradycardia, junctional at times, blood pressure stable; pulse normalized, cardiology to evaluate; warfarin, supratherapeutic INR 6. Metabolic acidosis, resolved 7. Anemia, hemoglobin 8.6 8. Hypoxia, chest x-ray with some vascular congestio; stable resp status post HD  P . Cont HD on MWF schedule: tomorrow AVF 3.5h, 2K, 400/600, no heparin . ESA with HD tomorrow . Medication Issues; o Preferred narcotic agents for pain control are hydromorphone, fentanyl, and methadone. Morphine should not be used.  o Baclofen should be avoided o Avoid oral sodium phosphate and magnesium citrate based laxatives / bowel preps    Pearson Grippe MD 08/04/2019, 9:28 AM  Recent Labs  Lab 08/01/19 1249 08/03/19 1120 08/03/19 1651 08/04/19 0443  NA 137 135  --  133*  K 4.1 5.7*  --  3.8  CL 98 96*  --  91*  CO2 26 19*  --  27  GLUCOSE 151* 187*  --  88  BUN 16 40*  --  15  CREATININE 3.93* 7.13*  --  3.25*  CALCIUM 8.6* 8.1*  --  7.9*  PHOS  --   --  7.0*  --    Recent Labs  Lab 08/01/19 1249 08/03/19 1120 08/04/19 0443  WBC 13.9* 8.2 7.9  NEUTROABS 12.1* 7.2  --  HGB 9.4* 9.9* 8.6*  HCT 32.3* 34.8* 28.3*  MCV 107.3* 108.8* 100.7*  PLT 224 253 233

## 2019-08-04 NOTE — Progress Notes (Signed)
Report called and given to Donavan Foil, RN on 300. Pt to be transported to room 338 via bed.

## 2019-08-04 NOTE — Plan of Care (Signed)
  Problem: Acute Rehab PT Goals(only PT should resolve) Goal: Pt Will Go Supine/Side To Sit Outcome: Progressing Flowsheets (Taken 08/04/2019 1232) Pt will go Supine/Side to Sit: with minimal assist Goal: Patient Will Transfer Sit To/From Stand Outcome: Progressing Flowsheets (Taken 08/04/2019 1232) Patient will transfer sit to/from stand: with minimal assist Goal: Pt Will Transfer Bed To Chair/Chair To Bed Outcome: Progressing Flowsheets (Taken 08/04/2019 1232) Pt will Transfer Bed to Chair/Chair to Bed: with min assist Goal: Pt Will Ambulate Outcome: Progressing Flowsheets (Taken 08/04/2019 1232) Pt will Ambulate:  25 feet  with minimal assist  12:32 PM, 08/04/19 Mearl Latin PT, DPT Physical Therapist at [facility]

## 2019-08-04 NOTE — NC FL2 (Signed)
[place] MEDICAID FL2 LEVEL OF CARE SCREENING TOOL     IDENTIFICATION  Patient Name: [name] Birthdate: 1941/02/17 Sex: female Admission Date (Current Location): 08/03/2019  [facility] and [place] Number:  [employer] and Address:  [facility] [address] [place]      Provider Number: [id number]  Attending Physician Name and Address:  Barton Dubois, MD  Relative Name and Phone Number:  Shirlean Schlein (friend) [place]: [phone]; Eudell Julian (son) [facility]: [phone]    Current Level of Care: Hospital Recommended Level of Care: [place] Prior Approval Number:    Date Approved/Denied:   PASRR Number: [id number]  Discharge Plan: SNF    Current Diagnoses: Patient Active Problem List   Diagnosis Date Noted  . Pressure injury of buttock, stage 2 ([place]) 08/04/2019  . Episode of unresponsiveness 08/03/2019  . Bradycardia 08/03/2019  . Cellulitis 07/02/2019  . Diabetic ulcer of right foot ([place]) 06/18/2019  . Acute hypoxemic respiratory failure due to COVID-19 ([place]) 05/19/2019  . Atrial fibrillation ([place]) 12/03/2018  . Encounter for therapeutic drug monitoring 12/03/2018  . Pressure injury of skin 11/24/2018  . CAD (coronary artery disease) 11/21/2018  . Chronic diastolic HF (heart failure) ([place]) 11/21/2018  . Respiratory failure with hypoxia ([place]) 11/21/2018  . End-stage renal disease on hemodialysis ([place]) 11/21/2018  . Diabetes mellitus type 2, controlled, with complications ([place]) 45/36/4680  . 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 ESRD (end stage renal disease) on dialysis ([place]) 11/12/2018  . Non-ST elevation (NSTEMI) myocardial infarction ([place]) 11/06/2018  . Polyp of transverse colon 10/08/2017  . Anemia 08/07/2017  . GI bleed 07/30/2017  . Hyperkalemia 07/30/2017  . Essential hypertension, benign 12/13/2016  . Hypothyroidism 12/13/2016  . Iron deficiency anemia 11/08/2016  . Normocytic anemia 10/26/2016  . Elevated  troponin   . Aortic atherosclerosis ([place]) 10/25/2016  . Symptomatic anemia 10/04/2016  . History of non-ST elevation myocardial infarction (NSTEMI) 10/04/2016  . Mixed hyperlipidemia 10/04/2016  . Neuropathy 09/20/2016  . Chronic kidney disease, stage IV (severe) ([place]) 08/03/2015  . Fall 02/05/2015  . Hip fracture ([place]) 02/05/2015  . HTN (hypertension) 02/05/2015  . Gout 02/05/2015  . Left humeral fracture 02/05/2015  . Hyperlipidemia associated with type 2 diabetes mellitus ([place]) 05/01/2013    Orientation RESPIRATION BLADDER Height & Weight     Self  Normal Continent Weight: 164 lb 7.4 oz (74.6 kg) Height:  5\' 4"  (162.6 cm)  BEHAVIORAL SYMPTOMS/MOOD NEUROLOGICAL BOWEL NUTRITION STATUS      Continent Diet(See discharge summary)  AMBULATORY STATUS COMMUNICATION OF NEEDS Skin   Limited Assist Verbally Other (Comment)(Right buttock, stage 2)                       Personal Care Assistance Level of Assistance  Bathing, Dressing Bathing Assistance: Limited assistance   Dressing Assistance: Limited assistance     Functional Limitations Info             [place]  PT (By licensed PT)     PT Frequency: At least 3x's/week              Contractures Contractures Info: Not present    Additional Factors Info  Code Status, Allergies Code Status Info: Full Allergies Info: Insulin Glargine; Statins           Current Medications (08/04/2019):  This is the current hospital active medication list Current Facility-Administered Medications  Medication Dose Route Frequency  Provider Last Rate Last Admin  . 0.9 %  sodium chloride infusion  100 mL Intravenous PRN Pearson Grippe B, MD      . 0.9 %  sodium chloride infusion  100 mL Intravenous PRN Pearson Grippe B, MD      . 0.9 %  sodium chloride infusion  250 mL Intravenous PRN Barton Dubois, MD      . acetaminophen (TYLENOL) tablet 650 mg  650 mg Oral Q6H PRN Barton Dubois, MD      . aspirin EC  tablet 81 mg  81 mg Oral Daily Barton Dubois, MD   81 mg at 08/04/19 0102  . calcium acetate (PHOSLO) capsule 667-2,001 mg  667-2,001 mg Oral TID WC Barton Dubois, MD      . Chlorhexidine Gluconate Cloth 2 % PADS 6 each  6 each Topical Q0600 Barton Dubois, MD   6 each at 08/04/19 [phone]  . Darbepoetin Alfa (ARANESP) injection 60 mcg  60 mcg Intravenous Q Tue-HD Pearson Grippe B, MD      . febuxostat (ULORIC) tablet 40 mg  40 mg Oral Daily Barton Dubois, MD   40 mg at 08/04/19 6644  . fentaNYL (SUBLIMAZE) injection 50 mcg  50 mcg Intravenous Q3H PRN Barton Dubois, MD   50 mcg at 08/04/19 1005  . gabapentin (NEURONTIN) capsule 300 mg  300 mg Oral BID Lang Snow, FNP   300 mg at 08/04/19 0347  . insulin aspart (novoLOG) injection 0-6 Units  0-6 Units Subcutaneous TID WC Barton Dubois, MD      . lidocaine (LIDODERM) 5 % 1 patch  1 patch Transdermal Q24H Barton Dubois, MD   1 patch at 08/04/19 1339  . lidocaine (PF) (XYLOCAINE) 1 % injection 5 mL  5 mL Intradermal PRN Pearson Grippe B, MD      . lidocaine-prilocaine (EMLA) cream 1 application  1 application Topical PRN Rexene Agent, MD      . MEDLINE mouth rinse  15 mL Mouth Rinse BID Barton Dubois, MD   15 mL at 08/04/19 0924  . multivitamin with minerals tablet 1 tablet  1 tablet Oral Daily Barton Dubois, MD   1 tablet at 08/04/19 [phone]  . ondansetron (ZOFRAN) tablet 4 mg  4 mg Oral Q6H PRN Barton Dubois, MD       Or  . ondansetron [facility]) injection 4 mg  4 mg Intravenous Q6H PRN Barton Dubois, MD      . pantoprazole (PROTONIX) EC tablet 40 mg  40 mg Oral Daily Barton Dubois, MD   40 mg at 08/04/19 5638  . pentafluoroprop-tetrafluoroeth (GEBAUERS) aerosol 1 application  1 application Topical PRN Pearson Grippe B, MD      . polyethylene glycol (MIRALAX / GLYCOLAX) packet 17 g  17 g Oral Daily PRN Barton Dubois, MD      . senna-docusate (Senokot-S) tablet 1 tablet  1 tablet Oral QHS Barton Dubois, MD      . sodium chloride flush (NS)  0.9 % injection 3 mL  3 mL Intravenous Q12H Barton Dubois, MD   3 mL at 08/04/19 0925  . sodium chloride flush (NS) 0.9 % injection 3 mL  3 mL Intravenous PRN Barton Dubois, MD      . thyroid (ARMOUR) tablet 120 mg  120 mg Oral Daily Barton Dubois, MD   120 mg at 08/04/19 0924  . traMADol (ULTRAM) tablet 50 mg  50 mg Oral Q12H PRN Barton Dubois, MD      . Vitamin D3 (  Vitamin D) tablet 5,000 Units  5,000 Units Oral Daily Barton Dubois, MD   5,000 Units at 08/04/19 [phone]  . Warfarin - Pharmacist Dosing Inpatient   Does not apply q1800 Nilda Simmer, [facility]         Discharge Medications: Please see discharge summary for a list of discharge medications.  Relevant Imaging Results:  Relevant Lab Results:   Additional Information SSN#: [id number]  Sherie Don, LCSW

## 2019-08-04 NOTE — Progress Notes (Signed)
PROGRESS NOTE    [name]  XNT:[id number] DOB: 1940/12/13 DOA: 08/03/2019 PCP: Doree Albee, MD     Brief Narrative:  79 y.o. female with PMH significant for type 2 diabetes with nephropathy, A, fib (on coumadin), HTN, chronic diastolic HF, GERD, ESRD (M-W-F schedule), mixed HLD, hypothyroidism and recent ED visit after mechanical fall with findings of T6-T7 compression fracture (08/01/19); who was brought to ED by EMS secondary to episode of unresponsiveness and AMS. Patient with partial resolution to her presenting symptoms after Narcan given by EMS, but found with hyperkalemia, new requirement of oxygen supplementation and significantly bradycardic. Patient denies CP, fever, chills, hematochezia, melena, abd pain, blurred vision, skipping beat sensations or focal weakness.  BP wass stable and CXR demonstrated mild vascular congestion. Calcium gluconate and lokelma given in ED along with atropine. Cardiology and nephrology consulted. [facility] contacted to admit patient for further evaluation and management.  Of note, patient is complaining  Of non radiating mid-back pain, 6-7/10 in intensity pain.    Assessment & Plan: 1-Episode of unresponsiveness -most likely associated with medication (recently started on narcotics as part of management for compression fractures in her thoracic spine). -Resolved and now back to baseline; patient received Narcan in route to the emergency department and then have hemodialysis as she was due for a -She is oriented x3 and in no acute distress. -No signs of infection appreciated. -Her electrolytes has now been corrected by dialysis treatment. -TSH within normal limits.  2-ESRD/Hyperkalemia -Patient receive Lokelma and calcium gluconate x1 -Hemodialysis done in a schedule yesterday 01/31/7590; no complications. -Electrolytes within normal limits currently. -follow trend and rec's by nephrology service for future HD treatments  3-atrial  fibrillation -Patient presented with bradycardia and intermittent junctional rhythm. -Continue holding amiodarone and metoprolol -Follow 2D echo -Cardiology service has been consulted and will follow the recommendations. -Continue Coumadin per pharmacy for secondary prevention; INR currently supratherapeutic at 3.6.  No signs of overt bleeding.  4-hypoxemia: in the setting of acute on chronic diastolic HF -chest x-ray with mild vascular congestion -Patient reports breathing is better today. -Continue volume management with dialysis -Continue low-sodium diet, strict I's and O's and daily weights.  5-Mixed hyperlipidemia -Continue outpatient weekly injection of Repatha  6-Essential hypertension, benign -Overall stable and well-controlled. -Continue closely monitoring VS -if needed will use hydralazine for elevated BP  7-Diabetes mellitus type 2, controlled, with complications ([place]) -Patient follows diet control as an outpatient only -Current A1c 4.9 -while Inpatient will use a sliding scale insulin.  8-gastroesophageal reflux disease -Continue PPI.  9-hypothyroidism -Continue NP thyroid.  10-acute T6-T7 compression fracture -Continue as needed analgesics; currently requiring IV pain medications and having difficult time controlling discomfort. -Physical therapy evaluation has been requested -Patient may end up requiring skilled nursing facility for further care, conditioning and rehab. -if pain remains uncontrollable, patient might benefit of evaluation from IR for candidacy to kyphoplasty intervention.   DVT prophylaxis: Chronically on Coumadin. Code Status: Full code Family Communication: No family at bedside. Disposition Plan: Remains in the hospital, transfer to telemetry bed, continue IV analgesics, follow electrolytes and recommendations for further dialysis per nephrology service.  Continue holding antiarrhythmics and rate control agents and follow recommendation by  cardiology service.  Consultants:   Nephrology service  Cardiology service  Procedures:   See below for x-ray reports.  Antimicrobials:  Anti-infectives (From admission, onward)   None      Subjective: Complaining of excruciating pain in her mid back; requiring IV pain medication and  having trouble with use bed repositioning on her own.  No chest pain, no nausea, no vomiting, no fever.  Oriented x3.  Objective: Vitals:   08/04/19 0700 08/04/19 0726 08/04/19 0800 08/04/19 0900  BP: (!) 141/45  (!) 139/45 (!) 139/42  Pulse: 66 69 64 68  Resp: 12 14 11 11   Temp:  98 F (36.7 C)    TempSrc:  Oral    SpO2: 93% 94% 97% 94%  Weight:      Height:        Intake/Output Summary (Last 24 hours) at 08/04/2019 0952 Last data filed at 08/04/2019 0900 Gross per 24 hour  Intake 593.67 ml  Output 2000 ml  Net -1406.33 ml   Filed Weights   08/03/19 1117 08/03/19 2155 08/03/19 2211  Weight: 72.6 kg 74.6 kg 74.6 kg    Examination: General exam: Alert, awake, oriented x 3; in mild to moderate distress secondary to mid back pain.  No chest pain, no no nausea, no vomiting.  Patient expressing inability to take deep breath secondary to pain in her back. Respiratory system: Clear to auscultation. Respiratory effort normal. Cardiovascular system:RRR. No murmurs, rubs, gallops. Gastrointestinal system: Abdomen is nondistended, soft and nontender. No organomegaly or masses felt. Normal bowel sounds heard. Central nervous system: Alert and oriented. No focal neurological deficits. Extremities: No cyanosis or clubbing. Skin: No rashes, no petechiae; stage II pressure injury right buttock appreciated on examination; no signs of superimposed infection.  Pressure injury was present prior to admission. Psychiatry: Judgement and insight appear normal. Mood & affect appropriate.    Data Reviewed: I have personally reviewed following labs and imaging studies  CBC: Recent Labs  Lab 08/01/19 1249  08/03/19 1120 08/04/19 0443  WBC 13.9* 8.2 7.9  NEUTROABS 12.1* 7.2  --   HGB 9.4* 9.9* 8.6*  HCT 32.3* 34.8* 28.3*  MCV 107.3* 108.8* 100.7*  PLT 224 253 409   Basic Metabolic Panel: Recent Labs  Lab 08/01/19 1249 08/03/19 1120 08/03/19 1651 08/04/19 0443  NA 137 135  --  133*  K 4.1 5.7*  --  3.8  CL 98 96*  --  91*  CO2 26 19*  --  27  GLUCOSE 151* 187*  --  88  BUN 16 40*  --  15  CREATININE 3.93* 7.13*  --  3.25*  CALCIUM 8.6* 8.1*  --  7.9*  MG  --   --  2.3  --   PHOS  --   --  7.0*  --    GFR: Estimated Creatinine Clearance: 13.9 mL/min (A) (by C-G formula based on SCr of 3.25 mg/dL (H)).  Coagulation Profile: Recent Labs  Lab 08/01/19 1249 08/03/19 1651 08/04/19 0443  INR 1.3* 4.3* 3.6*   HbA1C: Recent Labs    08/03/19 1120  HGBA1C 4.9   CBG: Recent Labs  Lab 08/03/19 2142 08/04/19 0724  GLUCAP 112* 89   Thyroid Function Tests: Recent Labs    08/03/19 1651  TSH 1.104   Urine analysis:    Component Value Date/Time   COLORURINE AMBER (A) 05/19/2019 0919   APPEARANCEUR HAZY (A) 05/19/2019 0919   LABSPEC 1.016 05/19/2019 0919   PHURINE 5.0 05/19/2019 0919   GLUCOSEU NEGATIVE 05/19/2019 0919   HGBUR NEGATIVE 05/19/2019 0919   BILIRUBINUR NEGATIVE 05/19/2019 0919   KETONESUR NEGATIVE 05/19/2019 0919   PROTEINUR 100 (A) 05/19/2019 0919   UROBILINOGEN 0.2 02/08/2015 1615   NITRITE NEGATIVE 05/19/2019 0919   LEUKOCYTESUR NEGATIVE 05/19/2019 0919  Recent Results (from the past 240 hour(s))  Blood culture (routine x 2)     Status: None (Preliminary result)   Collection Time: 08/03/19 11:21 AM   Specimen: BLOOD LEFT HAND  Result Value Ref Range Status   Specimen Description BLOOD LEFT HAND  Final   Special Requests   Final    BOTTLES DRAWN AEROBIC ONLY Blood Culture results may not be optimal due to an inadequate volume of blood received in culture bottles   Culture   Final    NO GROWTH < 24 HOURS Performed at [facility],  [address], [place], [place] [address]    Report Status PENDING  Incomplete  Blood culture (routine x 2)     Status: None (Preliminary result)   Collection Time: 08/03/19  1:22 PM   Specimen: BLOOD RIGHT ARM  Result Value Ref Range Status   Specimen Description BLOOD RIGHT ARM  Final   Special Requests   Final    BOTTLES DRAWN AEROBIC AND ANAEROBIC Blood Culture adequate volume   Culture   Final    NO GROWTH < 24 HOURS Performed at [facility], [address] [place], [place] [address]    Report Status PENDING  Incomplete  Respiratory Panel by RT PCR (Flu A&B, Covid) - Nasopharyngeal Swab     Status: None   Collection Time: 08/03/19  5:09 PM   Specimen: Nasopharyngeal Swab  Result Value Ref Range Status   SARS Coronavirus 2 by RT PCR NEGATIVE NEGATIVE Final    Comment: (NOTE) SARS-CoV-2 target nucleic acids are NOT DETECTED. The SARS-CoV-2 RNA is generally detectable in upper respiratoy specimens during the acute phase of infection. The lowest concentration of SARS-CoV-2 viral copies this assay can detect is 131 copies/mL. A negative result does not preclude SARS-Cov-2 infection and should not be used as the sole basis for treatment or other patient management decisions. A negative result may occur with  improper specimen collection/handling, submission of specimen other than nasopharyngeal swab, presence of viral mutation(s) within the areas targeted by this assay, and inadequate number of viral copies (<131 copies/mL). A negative result must be combined with clinical observations, patient history, and epidemiological information. The expected result is Negative. Fact Sheet for Patients:  PinkCheek.be Fact Sheet for Healthcare Providers:  GravelBags.it This test is not yet ap proved or cleared by the [place] FDA and  has been authorized for detection and/or diagnosis of SARS-CoV-2 by FDA under an Emergency Use  Authorization (EUA). This EUA will remain  in effect (meaning this test can be used) for the duration of the COVID-19 declaration under Section 564(b)(1) of the Act, 21 U.S.C. section 360bbb-3(b)(1), unless the authorization is terminated or revoked sooner.    Influenza A by PCR NEGATIVE NEGATIVE Final   Influenza B by PCR NEGATIVE NEGATIVE Final    Comment: (NOTE) The Xpert Xpress SARS-CoV-2/FLU/RSV assay is intended as an aid in  the diagnosis of influenza from Nasopharyngeal swab specimens and  should not be used as a sole basis for treatment. Nasal washings and  aspirates are unacceptable for Xpert Xpress SARS-CoV-2/FLU/RSV  testing. Fact Sheet for Patients: PinkCheek.be Fact Sheet for Healthcare Providers: GravelBags.it This test is not yet approved or cleared by the [place] FDA and  has been authorized for detection and/or diagnosis of SARS-CoV-2 by  FDA under an Emergency Use Authorization (EUA). This EUA will remain  in effect (meaning this test can be used) for the duration of the  Covid-19 declaration under Section  564(b)(1) of the Act, 21  U.S.C. section 360bbb-3(b)(1), unless the authorization is  terminated or revoked. Performed at [facility], [address] [place], [place] [address]   MRSA PCR Screening     Status: None   Collection Time: 08/03/19  9:52 PM   Specimen: Nasal Mucosa; Nasopharyngeal  Result Value Ref Range Status   MRSA by PCR NEGATIVE NEGATIVE Final    Comment:        The GeneXpert MRSA Assay (FDA approved for NASAL specimens only), is one component of a comprehensive MRSA colonization surveillance program. It is not intended to diagnose MRSA infection nor to guide or monitor treatment for MRSA infections. Performed at [facility], [address], [place], [place] [address]      Radiology Studies: DG Chest Portable 1 View  Result Date: 08/03/2019 CLINICAL DATA:  Altered level  of consciousness, bradycardia EXAM: PORTABLE CHEST 1 VIEW COMPARISON:  08/01/2019 FINDINGS: Cardiomegaly. Prior CABG. Elevation of the left hemidiaphragm with left base atelectasis and small left effusion. No confluent opacity on the right. Mild vascular congestion. IMPRESSION: Mild cardiomegaly, vascular congestion. Elevation of the left hemidiaphragm with left base atelectasis and small left effusion. Electronically Signed   By: Rolm Baptise M.D.   On: 08/03/2019 11:58    Scheduled Meds: . aspirin EC  81 mg Oral Daily  . calcium acetate  667-2,001 mg Oral TID WC  . Chlorhexidine Gluconate Cloth  6 each Topical Q0600  . darbepoetin (ARANESP) injection - DIALYSIS  60 mcg Intravenous Q Tue-HD  . febuxostat  40 mg Oral Daily  . gabapentin  300 mg Oral BID  . insulin aspart  0-6 Units Subcutaneous TID WC  . mouth rinse  15 mL Mouth Rinse BID  . multivitamin with minerals  1 tablet Oral Daily  . pantoprazole  40 mg Oral Daily  . sodium chloride flush  3 mL Intravenous Q12H  . thyroid  120 mg Oral Daily  . cholecalciferol  5,000 Units Oral Daily  . Warfarin - Pharmacist Dosing Inpatient   Does not apply q1800   Continuous Infusions: . sodium chloride    . sodium chloride    . sodium chloride       LOS: 0 days    Time spent: 30 minutes.    Barton Dubois, MD [facility] Pager [phone]   08/04/2019, 9:52 AM

## 2019-08-04 NOTE — Progress Notes (Signed)
ANTICOAGULATION CONSULT NOTE -   Pharmacy Consult for Warfarin Indication: atrial fibrillation  Allergies  Allergen Reactions  . Insulin Glargine Swelling    "Makes me swell like a balloon all over", including face, but without any respiratory distress or rashes. Associated with weight gain.  . Statins Other (See Comments)    "I've tried them all; my muscle aches were so bad I couldn't walk".    Patient Measurements: Height: 5\' 4"  (162.6 cm) Weight: 164 lb 7.4 oz (74.6 kg) IBW/kg (Calculated) : 54.7  Vital Signs: Temp: 98 F (36.7 C) (03/09 0726) Temp Source: Oral (03/09 0726) BP: 139/45 (03/09 0800) Pulse Rate: 64 (03/09 0800)  Labs: Recent Labs    08/01/19 1249 08/01/19 1249 08/03/19 1120 08/03/19 1322 08/03/19 1651 08/04/19 0443  HGB 9.4*   < > 9.9*  --   --  8.6*  HCT 32.3*  --  34.8*  --   --  28.3*  PLT 224  --  253  --   --  233  LABPROT 16.4*  --   --   --  41.1* 35.7*  INR 1.3*  --   --   --  4.3* 3.6*  CREATININE 3.93*  --  7.13*  --   --  3.25*  TROPONINIHS  --   --  21* 23*  --   --    < > = values in this interval not displayed.    Estimated Creatinine Clearance: 13.9 mL/min (A) (by C-G formula based on SCr of 3.25 mg/dL (H)).   Medical History: Past Medical History:  Diagnosis Date  . Anemia   . Arthritis   . Blood transfusion without reported diagnosis   . CAD (coronary artery disease)    a. 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  . Cataract   . Chronic anemia   . Chronic combined systolic and diastolic CHF (congestive heart failure) ([place])    a. 2D echo 08/2016 at [facility]: EF 50-55% with inferior wall HK, impaired LV filling, fair study.  . CKD (chronic kidney disease), stage III   . Diabetes ([place])   . Gastritis   . GERD (gastroesophageal reflux disease)   . Gout   . Headache   . HTN (hypertension)   . Hyperlipidemia   . Hypothyroidism   . Iron deficiency anemia 11/08/2016  . Normocytic anemia 10/26/2016  .  NSTEMI (non-ST elevated myocardial infarction) ([place])    a. Complex admission 08/2016 - with severe hyperglycemia, AKI on CKD, severe anemia down to Hgb 6.8, acute combined CHF, troponin of 8.5, cath deferred due to renal dysfunction.  . Thyroid disease   . Wears dentures     Medications:  PTA Warfarin  Assessment:  79 yr [name] presents with AMS   PMH significant for DM, AFib, HTN, dHF, ESRD, HLD and hypothyroidism.  Recent fall sustaining T6-T7 compression fracture (08/01/19).  Pharmacy consulted to continue dosing warfarin  Patient takes 5 mg on Tue/Sat and 7.5 mg ROW per anti coag clinic.   INR = 3.6 (supratherapeutic)  Goal of Therapy:  INR 2-3  Monitor platelets     Plan:  Hold warfarin x 1 dose. Monitor daily INR and s/s of bleeding.  Margot Ables, PharmD Clinical Pharmacist 08/04/2019 8:45 AM

## 2019-08-04 NOTE — Progress Notes (Signed)
  Echocardiogram 2D Echocardiogram has been performed.  Rj Pedrosa A Jerald Hennington 08/04/2019, 9:24 AM

## 2019-08-05 ENCOUNTER — Encounter (HOSPITAL_COMMUNITY): Payer: Self-pay | Admitting: Internal Medicine

## 2019-08-05 ENCOUNTER — Inpatient Hospital Stay (HOSPITAL_COMMUNITY): Payer: Medicare HMO

## 2019-08-05 DIAGNOSIS — I480 Paroxysmal atrial fibrillation: Secondary | ICD-10-CM

## 2019-08-05 DIAGNOSIS — L89302 Pressure ulcer of unspecified buttock, stage 2: Secondary | ICD-10-CM

## 2019-08-05 DIAGNOSIS — Z992 Dependence on renal dialysis: Secondary | ICD-10-CM

## 2019-08-05 DIAGNOSIS — I25119 Atherosclerotic heart disease of native coronary artery with unspecified angina pectoris: Secondary | ICD-10-CM

## 2019-08-05 DIAGNOSIS — N186 End stage renal disease: Secondary | ICD-10-CM

## 2019-08-05 DIAGNOSIS — R001 Bradycardia, unspecified: Secondary | ICD-10-CM

## 2019-08-05 LAB — RENAL FUNCTION PANEL
Albumin: 2.8 g/dL — ABNORMAL LOW (ref 3.5–5.0)
Anion gap: 15 (ref 5–15)
BUN: 36 mg/dL — ABNORMAL HIGH (ref 8–23)
CO2: 26 mmol/L (ref 22–32)
Calcium: 8.2 mg/dL — ABNORMAL LOW (ref 8.9–10.3)
Chloride: 90 mmol/L — ABNORMAL LOW (ref 98–111)
Creatinine, Ser: 5.69 mg/dL — ABNORMAL HIGH (ref 0.44–1.00)
GFR calc Af Amer: 8 mL/min — ABNORMAL LOW (ref >60)
GFR calc non Af Amer: 7 mL/min — ABNORMAL LOW (ref >60)
Glucose, Bld: 133 mg/dL — ABNORMAL HIGH (ref 70–99)
Phosphorus: 6.4 mg/dL — ABNORMAL HIGH (ref 2.5–4.6)
Potassium: 4.5 mmol/L (ref 3.5–5.1)
Sodium: 131 mmol/L — ABNORMAL LOW (ref 135–145)

## 2019-08-05 LAB — PROTIME-INR
INR: 2.7 — ABNORMAL HIGH (ref 0.8–1.2)
Prothrombin Time: 29 seconds — ABNORMAL HIGH (ref 11.4–15.2)

## 2019-08-05 LAB — CBC
HCT: 29.2 % — ABNORMAL LOW (ref 36.0–46.0)
Hemoglobin: 9 g/dL — ABNORMAL LOW (ref 12.0–15.0)
MCH: 31 pg (ref 26.0–34.0)
MCHC: 30.8 g/dL (ref 30.0–36.0)
MCV: 100.7 fL — ABNORMAL HIGH (ref 80.0–100.0)
Platelets: 306 10*3/uL (ref 150–400)
RBC: 2.9 MIL/uL — ABNORMAL LOW (ref 3.87–5.11)
RDW: 18.1 % — ABNORMAL HIGH (ref 11.5–15.5)
WBC: 6.9 10*3/uL (ref 4.0–10.5)
nRBC: 0 % (ref 0.0–0.2)

## 2019-08-05 LAB — GLUCOSE, CAPILLARY
Glucose-Capillary: 82 mg/dL (ref 70–99)
Glucose-Capillary: 88 mg/dL (ref 70–99)
Glucose-Capillary: 104 mg/dL — ABNORMAL HIGH (ref 70–99)
Glucose-Capillary: 97 mg/dL (ref 70–99)

## 2019-08-05 MED ORDER — WARFARIN SODIUM 5 MG PO TABS
5.0000 mg | ORAL_TABLET | Freq: Once | ORAL | Status: AC
  Administered 2019-08-05: 18:00:00 5 mg via ORAL
  Filled 2019-08-05: qty 1

## 2019-08-05 MED ORDER — AMIODARONE HCL 200 MG PO TABS
200.0000 mg | ORAL_TABLET | Freq: Every day | ORAL | Status: DC
  Administered 2019-08-05 – 2019-08-14 (×10): 200 mg via ORAL
  Filled 2019-08-05 (×10): qty 1

## 2019-08-05 NOTE — TOC Progression Note (Signed)
Transition of Care [facility]) - Progression Note    Patient Details  Name: [name] MRN: [id number] Date of Birth: 11/07/1940  Transition of Care [facility]) CM/SW [place], LCSW Phone Number: 08/05/2019, 1:23 PM  Clinical Narrative: CSW reviewed SNF bed offers. Called patient's friend, Shirlean Schlein, and son, Azizah [place], to discuss placement and they requested [facility]. CSW called Bernadene Bell and was informed patient is not managed by [facility]adene Bell. Caryn Section with [facility] to discuss what needs to be done prior to transfer to SNF. CSW received additional call from [place]. Mardene Celeste stated she had a phone call with the patient and [name] and was informed patient has to complete HD laying down rather than sitting due to back pain and patient will not be able to be transferred by stretcher to [facility] dialysis in [place]. RN, Levada Dy, asked CSW to follow with LTAC services to see if patient would quality for placement. Spoke with Lauren with [facility] and was informed patient would not qualify for LTAC at this time due to needing to have at least 3 midnights at ICU level of care and patient's care would need to be under a certain billing code. Received call from [facility] and was informed they do not offer stretcher dialysis.  Expected Discharge Plan: Skilled Nursing Facility Barriers to Discharge: Continued Medical Work up  Expected Discharge Plan and Services Expected Discharge Plan: [place] arrangements for the past 2 months: Single Family Home                  Readmission Risk Interventions Readmission Risk Prevention Plan 08/04/2019 11/27/2018 11/11/2018  Transportation Screening Complete Complete Complete  Social Work Consult for [place] Planning/Counseling - - Complete  Palliative Care Screening - - Not Applicable  Medication Review Press photographer) Complete Complete -  PCP or Specialist appointment within 3-5 days of  discharge Not Complete - -  PCP/Specialist Appt Not Complete comments Patient will discharge to SNF - -  [place] or [place] Not Complete Complete -  [place] or Home Care Consult Pt Refusal Comments Patient will discharge to SNF - -  [facility] Care/Counseling Consult Complete Complete -  Palliative Care Screening Not Complete Not Applicable -  Comments Patient discharging to SNF - -  [place] Complete Not Applicable -  Some recent data might be hidden

## 2019-08-05 NOTE — Procedures (Signed)
    HEMODIALYSIS TREATMENT NOTE:  Pt was unwilling to attempt transfer from bed to recliner for dialysis due to severe back pain.  States the pain is tolerable once she achieves a certain position lying in bed but it "takes [her] breath away" when she attempts to sit up.  3.5 hour heparin-free treatment completed in bed via right upper arm AVF (15g/antegrade). Goal met: 2 liters removed without interruption in ultrafiltration.  All blood was returned and hemostasis was achieved in 15 minutes.   Rockwell [place], RN

## 2019-08-05 NOTE — Consult Note (Signed)
Cardiology Consultation:   Patient ID: RENDY LAZARD; [id number]; [birth year]   Admit date: 08/03/2019 Date of Consult: 08/05/2019  Primary Care Provider: Doree Albee, MD Primary Cardiologist: Carlyle Dolly, MD Primary Electrophysiologist: None   Patient Profile:   [name] is a 79 y.o. female with a history of CAD status post CABG and June 2020, ESRD on hemodialysis, hypertension, hyperlipidemia, type 2 diabetes mellitus, hypothyroidism, and paroxysmal atrial fibrillation who is being seen today for the evaluation of transient bradycardia at the request of Dr. Carles Collet.  History of Present Illness:   [name] is currently admitted to the hospital after mechanical fall with subsequent findings of of T6-T7 compression fracture.  She had altered mental status that improved somewhat after Narcan and ultimately improved with further supportive measures including hemodialysis.  She was noted to develop episodes of junctional bradycardia initially in the setting of hyperkalemia requiring treatment with calcium gluconate and Lokelma in the ER, also atropine.  Both Toprol-XL and amiodarone were held.  She has been observed on the hospitalist service, telemetry shows sinus rhythm with no recurring bradycardia or pauses.  Follow-up echocardiogram was obtained and shows LVEF in the range of 65 to 70% with grade 2 diastolic dysfunction.  ECG has not been repeated since presentation.  She is being managed for pain control, also undergoing physical therapy.  Past Medical History:  Diagnosis Date  . Anemia   . Arthritis   . Blood transfusion without reported diagnosis   . CAD (coronary artery disease)    a. 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  . Cataract   . Chronic anemia   . Chronic combined systolic and diastolic CHF (congestive heart failure) ([place])    a. 2D echo 08/2016 at [facility]: EF 50-55% with inferior wall HK, impaired LV filling, fair study.  .  ESRD on hemodialysis ([place])   . Essential hypertension   . Gastritis   . GERD (gastroesophageal reflux disease)   . Gout   . Headache   . Hyperlipidemia   . Hypothyroidism   . Iron deficiency anemia 11/08/2016  . NSTEMI (non-ST elevated myocardial infarction) ([place])    a. Complex admission 08/2016 - with severe hyperglycemia, AKI on CKD, severe anemia down to Hgb 6.8, acute combined CHF, troponin of 8.5, cath deferred due to renal dysfunction.  . Paroxysmal atrial fibrillation ([place])   . Type 2 diabetes mellitus ([place])   . Wears dentures     Past Surgical History:  Procedure Laterality Date  . BASCILIC VEIN TRANSPOSITION Right 06/12/2018   Procedure: FIRST STAGE BASCILIC VEIN TRANSPOSITION RIGHT ARM;  Surgeon: Rosetta Posner, MD;  Location: [place];  Service: Vascular;  Laterality: Right;  . BASCILIC VEIN TRANSPOSITION Right 08/07/2018   Procedure: BASCILIC VEIN TRANSPOSITION SECOND STAGE RIGHT ARM;  Surgeon: Rosetta Posner, MD;  Location: [place];  Service: Vascular;  Laterality: Right;  . CATARACT EXTRACTION     left  . COLONOSCOPY WITH PROPOFOL N/A 10/08/2016   5 mm transverse colon polyp note resected due to plavix. hemorrhoids  . CORONARY ARTERY BYPASS GRAFT N/A 11/20/2018   Procedure: CORONARY ARTERY BYPASS GRAFTING (CABG), ON PUMP, TIMES four, USING LEFT INTERNAL MAMMARY ARTERY AND ENDOSCOPICALLY HARVESTED RIGHT GREATER SAPHENOUS VEIN;  Surgeon: Melrose Nakayama, MD;  Location: [place];  Service: Open Heart Surgery;  Laterality: N/A;  . ENTEROSCOPY N/A 08/08/2017   Procedure: ENTEROSCOPY;  Surgeon: Daneil Dolin, MD;  [name];  Service:  Endoscopy;  Laterality: N/A;  . ESOPHAGOGASTRODUODENOSCOPY N/A 10/06/2016   mild chroni gastritis, negative H.pylori  . ESOPHAGOGASTRODUODENOSCOPY (EGD) WITH PROPOFOL N/A 03/12/2017   mild edema/erythema of stomach, small bowel biopsy with focal villous tip lymphocytosis, ?partially developed celiac  . 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 GIVENS CAPSULE STUDY  10/08/2016   normal  . GIVENS CAPSULE STUDY N/A 10/29/2016   occasional gastric erosion, unremarkable small bowel  . LEFT HEART CATH AND CORONARY ANGIOGRAPHY N/A 11/12/2018   Procedure: LEFT HEART CATH AND CORONARY ANGIOGRAPHY;  Surgeon: Burnell Blanks, MD;  Location: [place] [facility];  Service: Cardiovascular;  Laterality: N/A;  . ORIF HUMERUS FRACTURE Left 02/07/2015   Procedure: OPEN REDUCTION INTERNAL FIXATION (ORIF) PROXIMAL HUMERUS FRACTURE;  Surgeon: Marybelle Killings, MD;  Location: [place];  Service: Orthopedics;  Laterality: Left;  . TEE WITHOUT CARDIOVERSION N/A 11/20/2018   Procedure: TRANSESOPHAGEAL ECHOCARDIOGRAM (TEE);  Surgeon: Melrose Nakayama, MD;  Location: [place];  Service: Open Heart Surgery;  Laterality: N/A;  . teeth extractions    . THYROID SURGERY    . TOTAL HIP ARTHROPLASTY Left 02/07/2015   Procedure: TOTAL HIP ARTHROPLASTY ANTERIOR APPROACH ;  Surgeon: Marybelle Killings, MD;  Location: [place];  Service: Orthopedics;  Laterality: Left;  . WRIST SURGERY Left      Inpatient Medications: Scheduled Meds: . aspirin EC  81 mg Oral Daily  . calcium acetate  667-2,001 mg Oral TID WC  . Chlorhexidine Gluconate Cloth  6 each Topical Q0600  . darbepoetin (ARANESP) injection - DIALYSIS  60 mcg Intravenous Q Tue-HD  . febuxostat  40 mg Oral Daily  . gabapentin  300 mg Oral BID  . insulin aspart  0-6 Units Subcutaneous TID WC  . lidocaine  1 patch Transdermal Q24H  . mouth rinse  15 mL Mouth Rinse BID  . multivitamin with minerals  1 tablet Oral Daily  . pantoprazole  40 mg Oral Daily  . senna-docusate  1 tablet Oral QHS  . sodium chloride flush  3 mL Intravenous Q12H  . thyroid  120 mg Oral Daily  . cholecalciferol  5,000  Units Oral Daily  . Warfarin - Pharmacist Dosing Inpatient   Does not apply q1800   Continuous Infusions: . sodium chloride    . sodium chloride    . sodium chloride     PRN Meds: sodium chloride, sodium chloride, sodium chloride, acetaminophen, fentaNYL (SUBLIMAZE) injection, lidocaine (PF), lidocaine-prilocaine, ondansetron **OR** ondansetron (ZOFRAN) IV, pentafluoroprop-tetrafluoroeth, polyethylene glycol, sodium chloride flush, traMADol  Allergies:    Allergies  Allergen Reactions  . Insulin Glargine Swelling    "Makes me swell like a balloon all over", including face, but without any respiratory distress or rashes. Associated with weight gain.  . Statins Other (See Comments)    "I've tried them all; my muscle aches were so bad I couldn't walk".    Social History:   Social History   Socioeconomic History  . Marital status: Widowed    Spouse name: [name]  . Number of children: 2  . Years of education: 58  . Highest education level: Not on file  Occupational History  . Occupation: retired  Tobacco Use  . Smoking status: Never Smoker  . Smokeless tobacco: Never Used  Substance and Sexual Activity  . Alcohol  use: No    Alcohol/week: 0.0 standard drinks  . Drug use: No  . Sexual activity: Not Currently  Other Topics Concern  . Not on file  Social History Narrative   Lives with significant other/partner [name]   He is her caregiver   Moved from [facility]      Her Son Halona Amstutz (lives In [place]) -- ph # [phone] ( would like to be called about care issues)   Social Determinants of Health   Financial Resource Strain:   . Difficulty of Paying Living Expenses: Not on file  Food Insecurity:   . Worried About Charity fundraiser in the Last Year: Not on file  . Ran Out of Food in the Last Year: Not on file  Transportation Needs:   . Lack of Transportation (Medical): Not on file  . Lack of Transportation (Non-Medical): Not on file  Physical Activity:   . Days of Exercise  per Week: Not on file  . Minutes of Exercise per Session: Not on file  Stress:   . Feeling of Stress : Not on file  Social Connections:   . Frequency of Communication with Friends and Family: Not on file  . Frequency of Social Gatherings with Friends and Family: Not on file  . Attends Religious Services: Not on file  . Active Member of Clubs or Organizations: Not on file  . Attends Archivist Meetings: Not on file  . Marital Status: Not on file  Intimate Partner Violence:   . Fear of Current or Ex-Partner: Not on file  . Emotionally Abused: Not on file  . Physically Abused: Not on file  . Sexually Abused: Not on file    Family History:   The patient's family history includes Asthma in her mother; Diabetes in her mother; Early death in her father; Early death (age of onset: 46) in her mother. There is no history of CAD or GI Bleed.  ROS: Complains of back pain with movement.  No palpitations or angina symptoms.  Physical Exam/Data:   Vitals:   08/04/19 2203 08/05/19 0500 08/05/19 0835 08/05/19 0900  BP: (!) 144/49 (!) 132/47    Pulse: 65 70    Resp: 16 20    Temp: 98.5 F (36.9 C) 98.4 F (36.9 C)    TempSrc: Oral Oral    SpO2: 94% 95% 96%   Weight:  72.7 kg  72.7 kg  Height:    5\' 4"  (1.626 m)   No intake or output data in the 24 hours ending 08/05/19 1006 Filed Weights   08/03/19 2211 08/05/19 0500 08/05/19 0900  Weight: 74.6 kg 72.7 kg 72.7 kg   Body mass index is 27.51 kg/m.   Gen: Elderly woman, no acute distress. HEENT: Conjunctiva and lids normal, oropharynx clear. Neck: Supple, no elevated JVP or carotid bruits, no thyromegaly. Lungs: Clear to auscultation, nonlabored breathing at rest. Cardiac: Regular rate and rhythm, no gallop, soft systolic murmur, no pericardial rub. Abdomen: Soft, nontender, bowel sounds present. Extremities: No pitting edema, distal pulses 2+. Skin: Warm and dry. Musculoskeletal: No kyphosis. Neuropsychiatric: Alert and  oriented x3, affect grossly appropriate.  EKG:  An ECG dated 08/03/2019 was personally reviewed today and demonstrated:  Junctional bradycardia.  Telemetry:  I personally reviewed telemetry which shows sinus rhythm.  Relevant CV Studies:  Echocardiogram 08/04/2019: 1. Left ventricular ejection fraction, by estimation, is 65 to 70%. The  left ventricle has normal function. The left ventricle has no regional  wall motion  abnormalities. Left ventricular diastolic parameters are  consistent with Grade II diastolic  dysfunction (pseudonormalization).  2. Right ventricular systolic function is normal. The right ventricular  size is normal. There is mildly elevated pulmonary artery systolic  pressure. The estimated right ventricular systolic pressure is 11.9 mmHg.  3. Left atrial size was upper normal.  4. The mitral valve is degenerative. Mild mitral valve regurgitation.  5. The aortic valve is tricuspid. Aortic valve regurgitation is not  visualized.  6. The inferior vena cava is normal in size with greater than 50%  respiratory variability, suggesting right atrial pressure of 3 mmHg.   Laboratory Data:  Chemistry Recent Labs  Lab 08/01/19 1249 08/03/19 1120 08/04/19 0443  NA 137 135 133*  K 4.1 5.7* 3.8  CL 98 96* 91*  CO2 26 19* 27  GLUCOSE 151* 187* 88  BUN 16 40* 15  CREATININE 3.93* 7.13* 3.25*  CALCIUM 8.6* 8.1* 7.9*  GFRNONAA 10* 5* 13*  GFRAA 12* 6* 15*  ANIONGAP 13 20* 15    No results for input(s): PROT, ALBUMIN, AST, ALT, ALKPHOS, BILITOT in the last 168 hours. Hematology Recent Labs  Lab 08/01/19 1249 08/03/19 1120 08/04/19 0443  WBC 13.9* 8.2 7.9  RBC 3.01* 3.20* 2.81*  HGB 9.4* 9.9* 8.6*  HCT 32.3* 34.8* 28.3*  MCV 107.3* 108.8* 100.7*  MCH 31.2 30.9 30.6  MCHC 29.1* 28.4* 30.4  RDW 20.1* 19.6* 18.6*  PLT 224 253 233   Cardiac Enzymes Recent Labs  Lab 08/03/19 1120 08/03/19 1322  TROPONINIHS 21* 23*   Radiology/Studies:  DG Ribs  Unilateral W/Chest Right  Result Date: 08/01/2019 CLINICAL DATA:  Right posterior rib tenderness following a fall this morning. EXAM: RIGHT RIBS AND CHEST - 3+ VIEW COMPARISON:  Portable chest dated 05/19/2019 FINDINGS: Normal sized heart. Post CABG changes. Small amount of interval linear atelectasis or scarring at the lung bases. Stable left proximal humerus fixation hardware. Old, healed right rib fractures with no acute rib fractures or pneumothorax seen. Previously described T6 and T7 vertebral compression deformities, better visualized on the thoracic spine radiographs obtained at the same time. IMPRESSION: 1. No acute rib fractures seen. 2. Small amount of interval bibasilar linear atelectasis or scarring. 3. Separately described interval T6 and T7 vertebral compression deformities. Electronically Signed   By: Claudie Revering M.D.   On: 08/01/2019 13:45   DG Thoracic Spine 2 View  Result Date: 08/01/2019 CLINICAL DATA:  Midline back tenderness following a fall. Upper back pain. EXAM: THORACIC SPINE 2 VIEWS COMPARISON:  Chest radiographs dated 01/05/2019 FINDINGS: Site scratch the interval approximately 20% inferior endplate compression deformity of the T6 vertebral body and approximately 30% compression deformity of the T7 vertebral body. No significant bony retropulsion visualized. No well visualized acute fracture lines. Post CABG changes and previously demonstrated left humerus fixation hardware. Thoracolumbar spine degenerative changes. IMPRESSION: 1. Interval approximately 20% inferior endplate compression deformity of the T6 vertebral body and approximately 30% compression deformity of the T7 vertebral body. 2. Thoracolumbar spine degenerative changes. Electronically Signed   By: Claudie Revering M.D.   On: 08/01/2019 13:42   DG Chest Portable 1 View  Result Date: 08/03/2019 CLINICAL DATA:  Altered level of consciousness, bradycardia EXAM: PORTABLE CHEST 1 VIEW COMPARISON:  08/01/2019 FINDINGS:  Cardiomegaly. Prior CABG. Elevation of the left hemidiaphragm with left base atelectasis and small left effusion. No confluent opacity on the right. Mild vascular congestion. IMPRESSION: Mild cardiomegaly, vascular congestion. Elevation of the left hemidiaphragm with left base  atelectasis and small left effusion. Electronically Signed   By: Rolm Baptise M.D.   On: 08/03/2019 11:58   ECHOCARDIOGRAM COMPLETE  Result Date: 08/04/2019    ECHOCARDIOGRAM REPORT   Patient Name:   [name] Date of Exam: 08/04/2019 Medical Rec #:  [id number]        Height:       64.0 in Accession #:    [id number]       Weight:       164.5 lb Date of Birth:  [birth year]        BSA:          1.800 m Patient Age:    25 years         BP:           139/42 mmHg Patient Gender: F                HR:           68 bpm. Exam Location:  [facility] Procedure: 2D Echo Indications:    Atrial Fibrillation 427.31 / I48.91  History:        Patient has prior history of Echocardiogram examinations, most                 recent 11/10/2018. Arrythmias:Atrial Fibrillation; Risk                 Factors:Hypertension, Dyslipidemia and Diabetes. ESRD                 Respiratory failure with hypoxia.  Sonographer:    Vikki Ports Turrentine Referring Phys: [place]  1. Left ventricular ejection fraction, by estimation, is 65 to 70%. The left ventricle has normal function. The left ventricle has no regional wall motion abnormalities. Left ventricular diastolic parameters are consistent with Grade II diastolic dysfunction (pseudonormalization).  2. Right ventricular systolic function is normal. The right ventricular size is normal. There is mildly elevated pulmonary artery systolic pressure. The estimated right ventricular systolic pressure is 85.2 mmHg.  3. Left atrial size was upper normal.  4. The mitral valve is degenerative. Mild mitral valve regurgitation.  5. The aortic valve is tricuspid. Aortic valve regurgitation is not visualized.  6. The  inferior vena cava is normal in size with greater than 50% respiratory variability, suggesting right atrial pressure of 3 mmHg. FINDINGS  Left Ventricle: Left ventricular ejection fraction, by estimation, is 65 to 70%. The left ventricle has normal function. The left ventricle has no regional wall motion abnormalities. The left ventricular internal cavity size was normal in size. There is  no left ventricular hypertrophy. Left ventricular diastolic parameters are consistent with Grade II diastolic dysfunction (pseudonormalization). Right Ventricle: The right ventricular size is normal. No increase in right ventricular wall thickness. Right ventricular systolic function is normal. There is mildly elevated pulmonary artery systolic pressure. The tricuspid regurgitant velocity is 2.88  m/s, and with an assumed right atrial pressure of 3 mmHg, the estimated right ventricular systolic pressure is 77.8 mmHg. Left Atrium: Left atrial size was upper normal. Right Atrium: Right atrial size was normal in size. Pericardium: There is no evidence of pericardial effusion. Mitral Valve: The mitral valve is degenerative in appearance. There is mild calcification of the mitral valve leaflet(s). Mild mitral annular calcification. Mild mitral valve regurgitation. Tricuspid Valve: The tricuspid valve is grossly normal. Tricuspid valve regurgitation is mild. Aortic Valve: The aortic valve is tricuspid. Aortic valve regurgitation is not visualized. Mild aortic valve annular  calcification. Aortic valve mean gradient measures 7.0 mmHg. Aortic valve peak gradient measures 12.7 mmHg. Aortic valve area, by VTI measures 2.05 cm. Pulmonic Valve: The pulmonic valve was grossly normal. Pulmonic valve regurgitation is not visualized. Aorta: The aortic root is normal in size and structure. Venous: The inferior vena cava is normal in size with greater than 50% respiratory variability, suggesting right atrial pressure of 3 mmHg. IAS/Shunts: No  atrial level shunt detected by color flow Doppler.  LEFT VENTRICLE PLAX 2D LVIDd:         4.98 cm  Diastology LVIDs:         3.44 cm  LV e' lateral:   14.00 cm/s LV PW:         0.74 cm  LV E/e' lateral: 9.3 LV IVS:        0.83 cm  LV e' medial:    4.57 cm/s LVOT diam:     1.80 cm  LV E/e' medial:  28.4 LV SV:         88 LV SV Index:   49 LVOT Area:     2.54 cm  RIGHT VENTRICLE RV S prime:     8.70 cm/s TAPSE (M-mode): 1.7 cm LEFT ATRIUM             Index       RIGHT ATRIUM           Index LA diam:        4.10 cm 2.28 cm/m  RA Area:     19.40 cm LA Vol (A2C):   42.7 ml 23.72 ml/m RA Volume:   56.80 ml  31.55 ml/m LA Vol (A4C):   58.8 ml 32.66 ml/m LA Biplane Vol: 53.3 ml 29.61 ml/m  AORTIC VALVE AV Area (Vmax):    1.72 cm AV Area (Vmean):   1.89 cm AV Area (VTI):     2.05 cm AV Vmax:           178.00 cm/s AV Vmean:          122.000 cm/s AV VTI:            0.431 m AV Peak Grad:      12.7 mmHg AV Mean Grad:      7.0 mmHg LVOT Vmax:         120.00 cm/s LVOT Vmean:        90.400 cm/s LVOT VTI:          0.347 m LVOT/AV VTI ratio: 0.81  AORTA Ao Root diam: 2.60 cm MITRAL VALVE                TRICUSPID VALVE MV Area (PHT): 6.54 cm     TR Peak grad:   33.2 mmHg MV Decel Time: 116 msec     TR Vmax:        288.00 cm/s MV E velocity: 130.00 cm/s MV A velocity: 111.00 cm/s  SHUNTS MV E/A ratio:  1.17         Systemic VTI:  0.35 m                             Systemic Diam: 1.80 cm Rozann Lesches MD Electronically signed by Rozann Lesches MD Signature Date/Time: 08/04/2019/10:07:37 AM    Final     Assessment and Plan:   1.  Transient junctional bradycardia following fall with altered mental status, back pain with T6-T7 compression fracture, and hyperkalemia in the setting  of ESRD.  Follow-up telemetry shows no recurring bradycardia or pauses.  Both Toprol-XL and amiodarone have been held so far.  2.  Paroxysmal atrial fibrillation, no recurrence under observation.  She has been on amiodarone, Lopressor, and  Coumadin as an outpatient.  3.  Multivessel CAD status post CABG in June 2020.  No recent angina symptoms reported.  High-sensitivity troponin I levels are not consistent with ACS.  4.  ESRD on hemodialysis.  5.  Mixed hyperlipidemia, on Repatha as an outpatient.  Follow-up ECG. Recent heart rates by telemetry in the 60s to 70s.  She continues on Coumadin with therapeutic INR.  Resume amiodarone at 200 mg daily.  Aim to gradually add back low-dose Toprol-XL if tolerated, otherwise continue to observe.   Signed, Rozann Lesches, MD  08/05/2019 10:06 AM

## 2019-08-05 NOTE — Progress Notes (Signed)
  [facility] Progress Note   Assessment/ Plan:   1. Hyperkalemia, mild; resolved with HD 2. ESRD, MWF, RUEAVF, [name]-- next planned HD for today 3. Confusion/AMS/encephalopathy: Improved; likely narcotic related, improved with Narcan 4. Recent fall with T6 and T7 compression fractures, acutely painful--> per primary 5. AFib with Bradycardia, junctional at times--> cardiology c/s 6. Metabolic acidosis, resolved 7. Anemia, hemoglobin 8.6 8. Hypoxia, chest x-ray with some vascular congestio; stable resp status post HD 9. Dispo: ? SNF  Subjective:    For HD today.  Reports R sided back pain- "since I fell".     Objective:   BP (!) 132/47   Pulse 70   Temp 98.4 F (36.9 C) (Oral)   Resp 20   Ht 5\' 4"  (1.626 m)   Wt 72.7 kg   SpO2 96%   BMI 27.51 kg/m   Physical Exam: Gen: appears uncomfortable, lying in bed CVS: RRR Resp: clear bilaterally Abd: soft, nontender Ext: no LE edema ACCESS: R AVR +T/B  Labs: BMET Recent Labs  Lab 08/01/19 1249 08/03/19 1120 08/03/19 1651 08/04/19 0443  NA 137 135  --  133*  K 4.1 5.7*  --  3.8  CL 98 96*  --  91*  CO2 26 19*  --  27  GLUCOSE 151* 187*  --  88  BUN 16 40*  --  15  CREATININE 3.93* 7.13*  --  3.25*  CALCIUM 8.6* 8.1*  --  7.9*  PHOS  --   --  7.0*  --    CBC Recent Labs  Lab 08/01/19 1249 08/03/19 1120 08/04/19 0443  WBC 13.9* 8.2 7.9  NEUTROABS 12.1* 7.2  --   HGB 9.4* 9.9* 8.6*  HCT 32.3* 34.8* 28.3*  MCV 107.3* 108.8* 100.7*  PLT 224 253 233      Medications:    . amiodarone  200 mg Oral Daily  . aspirin EC  81 mg Oral Daily  . calcium acetate  667-2,001 mg Oral TID WC  . Chlorhexidine Gluconate Cloth  6 each Topical Q0600  . darbepoetin (ARANESP) injection - DIALYSIS  60 mcg Intravenous Q Tue-HD  . febuxostat  40 mg Oral Daily  . gabapentin  300 mg Oral BID  . insulin aspart  0-6 Units Subcutaneous TID WC  . lidocaine  1 patch Transdermal Q24H  . mouth rinse  15 mL Mouth  Rinse BID  . multivitamin with minerals  1 tablet Oral Daily  . pantoprazole  40 mg Oral Daily  . senna-docusate  1 tablet Oral QHS  . sodium chloride flush  3 mL Intravenous Q12H  . thyroid  120 mg Oral Daily  . cholecalciferol  5,000 Units Oral Daily  . Warfarin - Pharmacist Dosing Inpatient   Does not apply q1800     Madelon Lips MD 08/05/2019, 10:54 AM

## 2019-08-05 NOTE — Progress Notes (Signed)
ANTICOAGULATION CONSULT NOTE -   Pharmacy Consult for Warfarin Indication: atrial fibrillation  Patient Measurements: Height: 5\' 4"  (162.6 cm) Weight: 160 lb 4.4 oz (72.7 kg) IBW/kg (Calculated) : 54.7  Vital Signs: Temp: 98.1 F (36.7 C) (03/10 1345) Temp Source: Oral (03/10 1345) BP: 132/55 (03/10 1430) Pulse Rate: 67 (03/10 1430)  Labs: Recent Labs    08/03/19 1120 08/03/19 1322 08/03/19 1651 08/04/19 0443 08/05/19 0504  HGB 9.9*  --   --  8.6*  --   HCT 34.8*  --   --  28.3*  --   PLT 253  --   --  233  --   LABPROT  --   --  41.1* 35.7* 29.0*  INR  --   --  4.3* 3.6* 2.7*  CREATININE 7.13*  --   --  3.25*  --   TROPONINIHS 21* 23*  --   --   --     Estimated Creatinine Clearance: 13.7 mL/min (A) (by C-G formula based on SCr of 3.25 mg/dL (H)).   Medications:  PTA Warfarin  Assessment:  79 yr female presents with AMS   PMH significant for DM, AFib, HTN, dHF, ESRD, HLD and hypothyroidism.  Recent fall sustaining T6-T7 compression fracture (08/01/19).  Pharmacy consulted to continue dosing warfarin  Patient takes 5 mg on Tue/Sat and 7.5 mg ROW per anti coag clinic.   INR = 2.7   Amiodarone re-started today-->need to reduce warfarin dose accordingly  Goal of Therapy:  INR 2-3  Monitor platelets    Plan:  Give warfarin 5mg x 1 dose today (33% < usual home dose) Monitor daily INR and s/s of bleeding.  Despina Pole, Pharm. D. Clinical Pharmacist 08/05/2019 2:45 PM

## 2019-08-05 NOTE — Progress Notes (Signed)
PROGRESS NOTE  THARA SEARING ENI:[id number] DOB: 1940/08/26 DOA: 08/03/2019 PCP: Doree Albee, MD  Brief History:  79 year old female with a history of paroxysmal atrial fibrillation on warfarin, hypertension, diastolic CHF, ESRD (MWF), hyperlipidemia, hypothyroidism, coronary artery disease presenting with altered mental status.  The patient was recently in the emergency department on 08/01/2019 after mechanical fall resulting in a T6-7 compression fracture.  She was sent home in stable condition with hydrocodone.  However, her caretaker and significant other found the patient with altered mental status and unresponsiveness.  EMS was activated.  The patient's mental status showed improvement after given Narcan.  However, the patient was noted to have hyperkalemia with new oxygen requirement with bradycardia in the 30-40 range.  Chest x-ray showed mild vascular congestion.  The patient was given calcium gluconate and Lokelma in the emergency department with atropine.  Cardiology and nephrology were consulted  Assessment/Plan: Acute toxic encephalopathy -Likely related to the patient's opioid medication -Recently started on hydrocodone for her thoracic compression fracture -Overall improved and back to baseline  Thoracic spine compression fracture (T6-T7) -Unfortunately, patient is unable to sit up for transport or dialysis -CT thoracic spine -Consult IR for possible kyphoplasty -Judicious opioid treatment  Transient junctional bradycardia  -Appreciate cardiology consult -May have been partly attributable to her hyperkalemia in the setting of ESRD -Amiodarone restarted -Aim to restart low-dose metoprolol succinate if tolerated, otherwise observe off  Chronic diastolic CHF -Volume status managed by hemodialysis -08/04/2019 echo EF of 65-70%, grade 2 DD, mild elevated PASP, mild MR/TR  ESRD -Appreciate nephrology consult -Hemodialysis on 08/05/2019  Paroxysmal atrial  fibrillation -Presently in sinus rhythm -Appreciate cardiology -Consult continue warfarin -Restarted amiodarone  Hypothyroidism -Continue Armour Thyroid  Essential hypertension -Holding metoprolol succinate and amlodipine secondary to soft blood pressure initially      Disposition Plan: Patient From: Home D/C Place: SNF when able to sit upright for transport and HD Barriers: inability to sit up for transport or HD  Family Communication:   Significant other updated at bedside 08/05/19  Consultants:  Renal; cardiology  Code Status:  FULL   DVT Prophylaxis:  warfarin   Procedures: As Listed in Progress Note Above  Antibiotics: None     Subjective: Pt complains of back pain.  Patient denies fevers, chills, headache, chest pain, dyspnea, nausea, vomiting, diarrhea, abdominal pain, dysuria, hematuria, hematochezia, and melena. No leg weakness  Objective: Vitals:   08/05/19 0500 08/05/19 0835 08/05/19 0900 08/05/19 1345  BP: (!) 132/47   (!) (P) 145/57  Pulse: 70   (P) 65  Resp: 20   (P) 18  Temp: 98.4 F (36.9 C)   (P) 98.1 F (36.7 C)  TempSrc: Oral   (P) Oral  SpO2: 95% 96%  (P) 95%  Weight: 72.7 kg  72.7 kg   Height:   5\' 4"  (1.626 m)    No intake or output data in the 24 hours ending 08/05/19 1417 Weight change: 0.1 kg Exam:   General:  Pt is alert, follows commands appropriately, not in acute distress  HEENT: No icterus, No thrush, No neck mass, [place]/AT  Cardiovascular: RRR, S1/S2, no rubs, no gallops  Respiratory: bibasilar rales  Abdomen: Soft/+BS, non tender, non distended, no guarding  Extremities: No edema, No lymphangitis, No petechiae, No rashes, no synovitis   Data Reviewed: I have personally reviewed following labs and imaging studies Basic Metabolic Panel: Recent Labs  Lab 08/01/19 1249 08/03/19 1120 08/03/19 1651  08/04/19 0443  NA 137 135  --  133*  K 4.1 5.7*  --  3.8  CL 98 96*  --  91*  CO2 26 19*  --  27  GLUCOSE 151*  187*  --  88  BUN 16 40*  --  15  CREATININE 3.93* 7.13*  --  3.25*  CALCIUM 8.6* 8.1*  --  7.9*  MG  --   --  2.3  --   PHOS  --   --  7.0*  --    Liver Function Tests: No results for input(s): AST, ALT, ALKPHOS, BILITOT, PROT, ALBUMIN in the last 168 hours. No results for input(s): LIPASE, AMYLASE in the last 168 hours. No results for input(s): AMMONIA in the last 168 hours. Coagulation Profile: Recent Labs  Lab 08/01/19 1249 08/03/19 1651 08/04/19 0443 08/05/19 0504  INR 1.3* 4.3* 3.6* 2.7*   CBC: Recent Labs  Lab 08/01/19 1249 08/03/19 1120 08/04/19 0443  WBC 13.9* 8.2 7.9  NEUTROABS 12.1* 7.2  --   HGB 9.4* 9.9* 8.6*  HCT 32.3* 34.8* 28.3*  MCV 107.3* 108.8* 100.7*  PLT 224 253 233   Cardiac Enzymes: No results for input(s): CKTOTAL, CKMB, CKMBINDEX, TROPONINI in the last 168 hours. BNP: Invalid input(s): POCBNP CBG: Recent Labs  Lab 08/04/19 1106 08/04/19 1649 08/04/19 2201 08/05/19 0748 08/05/19 1114  GLUCAP 87 83 74 82 88   HbA1C: Recent Labs    08/03/19 1120  HGBA1C 4.9   Urine analysis:    Component Value Date/Time   COLORURINE AMBER (A) 05/19/2019 0919   APPEARANCEUR HAZY (A) 05/19/2019 0919   LABSPEC 1.016 05/19/2019 0919   PHURINE 5.0 05/19/2019 0919   GLUCOSEU NEGATIVE 05/19/2019 0919   HGBUR NEGATIVE 05/19/2019 0919   BILIRUBINUR NEGATIVE 05/19/2019 0919   KETONESUR NEGATIVE 05/19/2019 0919   PROTEINUR 100 (A) 05/19/2019 0919   UROBILINOGEN 0.2 02/08/2015 1615   NITRITE NEGATIVE 05/19/2019 0919   LEUKOCYTESUR NEGATIVE 05/19/2019 0919   Sepsis Labs: @LABRCNTIP (procalcitonin:4,lacticidven:4) ) Recent Results (from the past 240 hour(s))  Blood culture (routine x 2)     Status: None (Preliminary result)   Collection Time: 08/03/19 11:21 AM   Specimen: BLOOD LEFT HAND  Result Value Ref Range Status   Specimen Description BLOOD LEFT HAND  Final   Special Requests   Final    BOTTLES DRAWN AEROBIC ONLY Blood Culture results may not  be optimal due to an inadequate volume of blood received in culture bottles   Culture   Final    NO GROWTH 2 DAYS Performed at [facility], [address], [place], [place] [address]    Report Status PENDING  Incomplete  Blood culture (routine x 2)     Status: None (Preliminary result)   Collection Time: 08/03/19  1:22 PM   Specimen: BLOOD RIGHT ARM  Result Value Ref Range Status   Specimen Description BLOOD RIGHT ARM  Final   Special Requests   Final    BOTTLES DRAWN AEROBIC AND ANAEROBIC Blood Culture adequate volume   Culture   Final    NO GROWTH 2 DAYS Performed at [facility], [address], [place], [place] [address]    Report Status PENDING  Incomplete  Respiratory Panel by RT PCR (Flu A&B, Covid) - Nasopharyngeal Swab     Status: None   Collection Time: 08/03/19  5:09 PM   Specimen: Nasopharyngeal Swab  Result Value Ref Range Status   SARS Coronavirus 2 by RT PCR NEGATIVE NEGATIVE Final  Comment: (NOTE) SARS-CoV-2 target nucleic acids are NOT DETECTED. The SARS-CoV-2 RNA is generally detectable in upper respiratoy specimens during the acute phase of infection. The lowest concentration of SARS-CoV-2 viral copies this assay can detect is 131 copies/mL. A negative result does not preclude SARS-Cov-2 infection and should not be used as the sole basis for treatment or other patient management decisions. A negative result may occur with  improper specimen collection/handling, submission of specimen other than nasopharyngeal swab, presence of viral mutation(s) within the areas targeted by this assay, and inadequate number of viral copies (<131 copies/mL). A negative result must be combined with clinical observations, patient history, and epidemiological information. The expected result is Negative. Fact Sheet for Patients:  PinkCheek.be Fact Sheet for Healthcare Providers:  GravelBags.it This test is not yet ap  proved or cleared by the [place] FDA and  has been authorized for detection and/or diagnosis of SARS-CoV-2 by FDA under an Emergency Use Authorization (EUA). This EUA will remain  in effect (meaning this test can be used) for the duration of the COVID-19 declaration under Section 564(b)(1) of the Act, 21 U.S.C. section 360bbb-3(b)(1), unless the authorization is terminated or revoked sooner.    Influenza A by PCR NEGATIVE NEGATIVE Final   Influenza B by PCR NEGATIVE NEGATIVE Final    Comment: (NOTE) The Xpert Xpress SARS-CoV-2/FLU/RSV assay is intended as an aid in  the diagnosis of influenza from Nasopharyngeal swab specimens and  should not be used as a sole basis for treatment. Nasal washings and  aspirates are unacceptable for Xpert Xpress SARS-CoV-2/FLU/RSV  testing. Fact Sheet for Patients: PinkCheek.be Fact Sheet for Healthcare Providers: GravelBags.it This test is not yet approved or cleared by the [place] FDA and  has been authorized for detection and/or diagnosis of SARS-CoV-2 by  FDA under an Emergency Use Authorization (EUA). This EUA will remain  in effect (meaning this test can be used) for the duration of the  Covid-19 declaration under Section 564(b)(1) of the Act, 21  U.S.C. section 360bbb-3(b)(1), unless the authorization is  terminated or revoked. Performed at [facility], [address], [place], [place] [address]   MRSA PCR Screening     Status: None   Collection Time: 08/03/19  9:52 PM   Specimen: Nasal Mucosa; Nasopharyngeal  Result Value Ref Range Status   MRSA by PCR NEGATIVE NEGATIVE Final    Comment:        The GeneXpert MRSA Assay (FDA approved for NASAL specimens only), is one component of a comprehensive MRSA colonization surveillance program. It is not intended to diagnose MRSA infection nor to guide or monitor treatment for MRSA infections. Performed at [facility],  [address] [place],  [address]      Scheduled Meds: . amiodarone  200 mg Oral Daily  . aspirin EC  81 mg Oral Daily  . calcium acetate  667-2,001 mg Oral TID WC  . Chlorhexidine Gluconate Cloth  6 each Topical Q0600  . darbepoetin (ARANESP) injection - DIALYSIS  60 mcg Intravenous Q Tue-HD  . febuxostat  40 mg Oral Daily  . gabapentin  300 mg Oral BID  . insulin aspart  0-6 Units Subcutaneous TID WC  . lidocaine  1 patch Transdermal Q24H  . mouth rinse  15 mL Mouth Rinse BID  . multivitamin with minerals  1 tablet Oral Daily  . pantoprazole  40 mg Oral Daily  . senna-docusate  1 tablet Oral QHS  . sodium chloride flush  3 mL  Intravenous Q12H  . thyroid  120 mg Oral Daily  . cholecalciferol  5,000 Units Oral Daily  . Warfarin - Pharmacist Dosing Inpatient   Does not apply q1800   Continuous Infusions: . sodium chloride    . sodium chloride    . sodium chloride      Procedures/Studies: DG Ribs Unilateral W/Chest Right  Result Date: 08/01/2019 CLINICAL DATA:  Right posterior rib tenderness following a fall this morning. EXAM: RIGHT RIBS AND CHEST - 3+ VIEW COMPARISON:  Portable chest dated 05/19/2019 FINDINGS: Normal sized heart. Post CABG changes. Small amount of interval linear atelectasis or scarring at the lung bases. Stable left proximal humerus fixation hardware. Old, healed right rib fractures with no acute rib fractures or pneumothorax seen. Previously described T6 and T7 vertebral compression deformities, better visualized on the thoracic spine radiographs obtained at the same time. IMPRESSION: 1. No acute rib fractures seen. 2. Small amount of interval bibasilar linear atelectasis or scarring. 3. Separately described interval T6 and T7 vertebral compression deformities. Electronically Signed   By: Claudie Revering M.D.   On: 08/01/2019 13:45   DG Thoracic Spine 2 View  Result Date: 08/01/2019 CLINICAL DATA:  Midline back tenderness following a fall. Upper back pain. EXAM:  THORACIC SPINE 2 VIEWS COMPARISON:  Chest radiographs dated 01/05/2019 FINDINGS: Site scratch the interval approximately 20% inferior endplate compression deformity of the T6 vertebral body and approximately 30% compression deformity of the T7 vertebral body. No significant bony retropulsion visualized. No well visualized acute fracture lines. Post CABG changes and previously demonstrated left humerus fixation hardware. Thoracolumbar spine degenerative changes. IMPRESSION: 1. Interval approximately 20% inferior endplate compression deformity of the T6 vertebral body and approximately 30% compression deformity of the T7 vertebral body. 2. Thoracolumbar spine degenerative changes. Electronically Signed   By: Claudie Revering M.D.   On: 08/01/2019 13:42   DG Chest Portable 1 View  Result Date: 08/03/2019 CLINICAL DATA:  Altered level of consciousness, bradycardia EXAM: PORTABLE CHEST 1 VIEW COMPARISON:  08/01/2019 FINDINGS: Cardiomegaly. Prior CABG. Elevation of the left hemidiaphragm with left base atelectasis and small left effusion. No confluent opacity on the right. Mild vascular congestion. IMPRESSION: Mild cardiomegaly, vascular congestion. Elevation of the left hemidiaphragm with left base atelectasis and small left effusion. Electronically Signed   By: Rolm Baptise M.D.   On: 08/03/2019 11:58   ECHOCARDIOGRAM COMPLETE  Result Date: 08/04/2019    ECHOCARDIOGRAM REPORT   Patient Name:   [name] Date of Exam: 08/04/2019 Medical Rec #:  [id number]        Height:       64.0 in Accession #:    [id number]       Weight:       164.5 lb Date of Birth:  [birth year]        BSA:          1.800 m Patient Age:    58 years         BP:           139/42 mmHg Patient Gender: F                HR:           68 bpm. Exam Location:  [facility] Procedure: 2D Echo Indications:    Atrial Fibrillation 427.31 / I48.91  History:        Patient has prior history of Echocardiogram examinations, most  recent 11/10/2018.  Arrythmias:Atrial Fibrillation; Risk                 Factors:Hypertension, Dyslipidemia and Diabetes. ESRD                 Respiratory failure with hypoxia.  Sonographer:    Vikki Ports Turrentine Referring Phys: [place]  1. Left ventricular ejection fraction, by estimation, is 65 to 70%. The left ventricle has normal function. The left ventricle has no regional wall motion abnormalities. Left ventricular diastolic parameters are consistent with Grade II diastolic dysfunction (pseudonormalization).  2. Right ventricular systolic function is normal. The right ventricular size is normal. There is mildly elevated pulmonary artery systolic pressure. The estimated right ventricular systolic pressure is 81.4 mmHg.  3. Left atrial size was upper normal.  4. The mitral valve is degenerative. Mild mitral valve regurgitation.  5. The aortic valve is tricuspid. Aortic valve regurgitation is not visualized.  6. The inferior vena cava is normal in size with greater than 50% respiratory variability, suggesting right atrial pressure of 3 mmHg. FINDINGS  Left Ventricle: Left ventricular ejection fraction, by estimation, is 65 to 70%. The left ventricle has normal function. The left ventricle has no regional wall motion abnormalities. The left ventricular internal cavity size was normal in size. There is  no left ventricular hypertrophy. Left ventricular diastolic parameters are consistent with Grade II diastolic dysfunction (pseudonormalization). Right Ventricle: The right ventricular size is normal. No increase in right ventricular wall thickness. Right ventricular systolic function is normal. There is mildly elevated pulmonary artery systolic pressure. The tricuspid regurgitant velocity is 2.88  m/s, and with an assumed right atrial pressure of 3 mmHg, the estimated right ventricular systolic pressure is 48.1 mmHg. Left Atrium: Left atrial size was upper normal. Right Atrium: Right atrial size was normal in  size. Pericardium: There is no evidence of pericardial effusion. Mitral Valve: The mitral valve is degenerative in appearance. There is mild calcification of the mitral valve leaflet(s). Mild mitral annular calcification. Mild mitral valve regurgitation. Tricuspid Valve: The tricuspid valve is grossly normal. Tricuspid valve regurgitation is mild. Aortic Valve: The aortic valve is tricuspid. Aortic valve regurgitation is not visualized. Mild aortic valve annular calcification. Aortic valve mean gradient measures 7.0 mmHg. Aortic valve peak gradient measures 12.7 mmHg. Aortic valve area, by VTI measures 2.05 cm. Pulmonic Valve: The pulmonic valve was grossly normal. Pulmonic valve regurgitation is not visualized. Aorta: The aortic root is normal in size and structure. Venous: The inferior vena cava is normal in size with greater than 50% respiratory variability, suggesting right atrial pressure of 3 mmHg. IAS/Shunts: No atrial level shunt detected by color flow Doppler.  LEFT VENTRICLE PLAX 2D LVIDd:         4.98 cm  Diastology LVIDs:         3.44 cm  LV e' lateral:   14.00 cm/s LV PW:         0.74 cm  LV E/e' lateral: 9.3 LV IVS:        0.83 cm  LV e' medial:    4.57 cm/s LVOT diam:     1.80 cm  LV E/e' medial:  28.4 LV SV:         88 LV SV Index:   49 LVOT Area:     2.54 cm  RIGHT VENTRICLE RV S prime:     8.70 cm/s TAPSE (M-mode): 1.7 cm LEFT ATRIUM  Index       RIGHT ATRIUM           Index LA diam:        4.10 cm 2.28 cm/m  RA Area:     19.40 cm LA Vol (A2C):   42.7 ml 23.72 ml/m RA Volume:   56.80 ml  31.55 ml/m LA Vol (A4C):   58.8 ml 32.66 ml/m LA Biplane Vol: 53.3 ml 29.61 ml/m  AORTIC VALVE AV Area (Vmax):    1.72 cm AV Area (Vmean):   1.89 cm AV Area (VTI):     2.05 cm AV Vmax:           178.00 cm/s AV Vmean:          122.000 cm/s AV VTI:            0.431 m AV Peak Grad:      12.7 mmHg AV Mean Grad:      7.0 mmHg LVOT Vmax:         120.00 cm/s LVOT Vmean:        90.400 cm/s LVOT VTI:           0.347 m LVOT/AV VTI ratio: 0.81  AORTA Ao Root diam: 2.60 cm MITRAL VALVE                TRICUSPID VALVE MV Area (PHT): 6.54 cm     TR Peak grad:   33.2 mmHg MV Decel Time: 116 msec     TR Vmax:        288.00 cm/s MV E velocity: 130.00 cm/s MV A velocity: 111.00 cm/s  SHUNTS MV E/A ratio:  1.17         Systemic VTI:  0.35 m                             Systemic Diam: 1.80 cm Rozann Lesches MD Electronically signed by Rozann Lesches MD Signature Date/Time: 08/04/2019/10:07:37 AM    Final     Orson Eva, DO  [facility]  If 7PM-7AM, please contact night-coverage [email or web address] Password [email or web address] 08/05/2019, 2:17 PM   LOS: 1 day

## 2019-08-05 NOTE — Progress Notes (Signed)
Pt is being transported to CT in bed accompanied by CT Techs. She was given pain medication as ordered shortly before she left the floor as to tolerate transferring within CT area.

## 2019-08-05 NOTE — Progress Notes (Signed)
Physical Therapy Treatment Patient Details Name: [name] MRN: [id number] DOB: Apr 24, 1941 Today's Date: 08/05/2019    History of Present Illness [name] is a 79 y.o. female with PMH significant for type 2 diabetes with nephropathy, A, fib (on coumadin), HTN, chronic diastolic HF, GERD, ESRD (M-W-F schedule), mixed HLD, hypothyroidism and recent ED visit after mechanical fall with findings of T6-T7 compression fracture (08/01/19); who was brought to ED by EMS secondary to episode of unresponsiveness and AMS. Patient with partial resolution to her presenting symptoms after Narcan given by EMS, but found with hyperkalemia, new requirement of oxygen supplementation and significantly bradycardic. Patient denies CP, fever, chills, hematochezia, melena, abd pain, blurred vision, skipping beat sensations or focal weakness.    PT Comments    Pt limited by pain.  RN aware and pt premedicated prior session.  Pt wishes to stay in bed, reports increased pain in seated position.  Pt educated on importance of mobility for strength and to assist with pain (movement assists with bowel movement as well, etc...)  Pt requested to stay in bed for pain control.  Added lumbar mobility stretches for pain control including LTR, SKTC and DKTC with reports of relief.  EOS pt left in bed with call bell within reach and heat applied for comfort.  RN aware of status and pain.     Follow Up Recommendations  SNF     Equipment Recommendations  None recommended by PT    Recommendations for Other Services       Precautions / Restrictions Precautions Precautions: Fall Restrictions Weight Bearing Restrictions: No    Mobility  Bed Mobility Overal bed mobility: Modified Independent   Rolling: Min guard;Supervision         General bed mobility comments: Pt limited by pain, able to roll Independently though apprehensive due to increase in pain.  Pt requested to stay in bed as sitting increased  pain.  Transfers                    Ambulation/Gait                 Stairs             Wheelchair Mobility    Modified Rankin (Stroke Patients Only)       Balance                                            Cognition Arousal/Alertness: Awake/alert Behavior During Therapy: WFL for tasks assessed/performed Overall Cognitive Status: Within Functional Limits for tasks assessed                                        Exercises Other Exercises Other Exercises: LTR 10x10", SKTC 2x 20", DKTC 2x 20", heel slide 5-10x each; quad sets 5x; ankle pumps 10x    General Comments        Pertinent Vitals/Pain Pain Assessment: 0-10 Pain Score: 9  Pain Location: back Rt>Lt Pain Descriptors / Indicators: Aching;Sore Pain Intervention(s): Premedicated before session;Monitored during session;Repositioned;Heat applied;Limited activity within patient's tolerance    Home Living                      Prior Function  PT Goals (current goals can now be found in the care plan section)      Frequency    Min 3X/week      PT Plan Current plan remains appropriate    Co-evaluation              AM-PAC PT "6 Clicks" Mobility   Outcome Measure  Help needed turning from your back to your side while in a flat bed without using bedrails?: A Lot Help needed moving from lying on your back to sitting on the side of a flat bed without using bedrails?: A Lot Help needed moving to and from a bed to a chair (including a wheelchair)?: A Lot Help needed standing up from a chair using your arms (e.g., wheelchair or bedside chair)?: A Lot Help needed to walk in hospital room?: A Lot Help needed climbing 3-5 steps with a railing? : A Lot 6 Click Score: 12    End of Session Equipment Utilized During Treatment: Oxygen(95% saturation) Activity Tolerance: Patient limited by pain Patient left: in bed;with call bell/phone  within reach Nurse Communication: Mobility status;Patient requests pain meds PT Visit Diagnosis: Unsteadiness on feet (R26.81);Other abnormalities of gait and mobility (R26.89);Muscle weakness (generalized) (M62.81)     Time: 0383-3383 PT Time Calculation (min) (ACUTE ONLY): 20 min  Charges:                        Ihor Austin, LPTA/CLT; CBIS [phone]  Aldona Lento 08/05/2019, 2:10 PM

## 2019-08-05 NOTE — Progress Notes (Signed)
**Note De-identified [name] Obfuscation** EKG completed and placed in patient chart 

## 2019-08-06 ENCOUNTER — Inpatient Hospital Stay (HOSPITAL_COMMUNITY): Payer: Medicare HMO

## 2019-08-06 DIAGNOSIS — R4189 Other symptoms and signs involving cognitive functions and awareness: Secondary | ICD-10-CM

## 2019-08-06 LAB — GLUCOSE, CAPILLARY
Glucose-Capillary: 100 mg/dL — ABNORMAL HIGH (ref 70–99)
Glucose-Capillary: 100 mg/dL — ABNORMAL HIGH (ref 70–99)
Glucose-Capillary: 85 mg/dL (ref 70–99)
Glucose-Capillary: 109 mg/dL — ABNORMAL HIGH (ref 70–99)

## 2019-08-06 LAB — PROTIME-INR
INR: 2.5 — ABNORMAL HIGH (ref 0.8–1.2)
Prothrombin Time: 26.9 seconds — ABNORMAL HIGH (ref 11.4–15.2)

## 2019-08-06 MED ORDER — HYDROCODONE-ACETAMINOPHEN 5-325 MG PO TABS
1.0000 | ORAL_TABLET | Freq: Four times a day (QID) | ORAL | Status: DC | PRN
  Administered 2019-08-06 – 2019-08-14 (×24): 1 via ORAL
  Filled 2019-08-06 (×25): qty 1

## 2019-08-06 MED ORDER — POLYETHYLENE GLYCOL 3350 17 G PO PACK
17.0000 g | PACK | Freq: Every day | ORAL | Status: DC
  Administered 2019-08-06 – 2019-08-14 (×8): 17 g via ORAL
  Filled 2019-08-06 (×8): qty 1

## 2019-08-06 MED ORDER — NEPRO/CARBSTEADY PO LIQD
237.0000 mL | Freq: Two times a day (BID) | ORAL | Status: DC
  Administered 2019-08-07 – 2019-08-11 (×8): 237 mL via ORAL

## 2019-08-06 MED ORDER — WARFARIN SODIUM 7.5 MG PO TABS
7.5000 mg | ORAL_TABLET | Freq: Once | ORAL | Status: AC
  Administered 2019-08-06: 17:00:00 7.5 mg via ORAL
  Filled 2019-08-06: qty 1

## 2019-08-06 MED ORDER — BISACODYL 10 MG RE SUPP
10.0000 mg | Freq: Once | RECTAL | Status: AC
  Administered 2019-08-06: 17:00:00 10 mg via RECTAL
  Filled 2019-08-06: qty 1

## 2019-08-06 NOTE — Progress Notes (Addendum)
PROGRESS NOTE  [name] QBH:[id number] DOB: 16-Apr-1941 DOA: 08/03/2019 PCP: Doree Albee, MD  Brief History:  79 year old female with a history of paroxysmal atrial fibrillation on warfarin, hypertension, diastolic CHF, ESRD (MWF), hyperlipidemia, hypothyroidism, coronary artery disease presenting with altered mental status.  The patient was recently in the emergency department on 08/01/2019 after mechanical fall resulting in a T6-7 compression fracture.  She was sent home in stable condition with hydrocodone.  However, her caretaker and significant other found the patient with altered mental status and unresponsiveness.  EMS was activated.  The patient's mental status showed improvement after given Narcan.  However, the patient was noted to have hyperkalemia with new oxygen requirement with bradycardia in the 30-40 range.  Chest x-ray showed mild vascular congestion.  The patient was given calcium gluconate and Lokelma in the emergency department with atropine.  Cardiology and nephrology were consulted  Assessment/Plan: Acute toxic encephalopathy -Likely related to the patient's opioid medication--significant other was giving pt opioids q2-4 hour at home  -Recently started on hydrocodone for her thoracic compression fracture -Overall improved and back to baseline  Thoracic spine compression fracture (T6-T7) -Unfortunately, patient is unable to sit up for transport or dialysis -CT thoracic spine--Oblique fracture through the T7 vertebral body. Minimal loss of height of T6 and T7, no more than 10-15%. No retropulsed bone. -Consult IR for possible kyphoplasty-->requesting MR thoracic spine to fully evaluate -Judicious opioid treatment--start norco 5/325  Transient junctional bradycardia  -Appreciate cardiology consult -May have been partly attributable to her hyperkalemia in the setting of ESRD -Amiodarone restarted -observe off of metoprolol -no further bradycardia or  pauses  Chronic diastolic CHF -Volume status managed by hemodialysis -08/04/2019 echo EF of 65-70%, grade 2 DD, mild elevated PASP, mild MR/TR  ESRD -Appreciate nephrology consult -Hemodialysis on 08/05/2019  Paroxysmal atrial fibrillation -Presently in sinus rhythm -Appreciate cardiology -continue warfarin -Restarted amiodarone  Hypothyroidism -Continue [place] Thyroid  Essential hypertension -Holding metoprolol succinate and amlodipine secondary to soft blood pressure initially      Disposition Plan: Patient From: Home D/C Place: SNF when able to sit upright for transport and HD Barriers: inability to sit up for transport or HD  Family Communication:   Significant other updated at bedside 08/05/19  Consultants:  Renal; cardiology  Code Status:  FULL   DVT Prophylaxis:  warfarin   Procedures: As Listed in Progress Note Above  Antibiotics: None     Subjective: Pt complains of back pain. States that any type of movement causes it to hurt severely.  Denies leg pain.  Denies f/c, cp, sob, n/v/d, abd pain  Objective: Vitals:   08/05/19 1630 08/05/19 1700 08/05/19 2051 08/06/19 0500  BP: 124/62 114/80 (!) 120/44 (!) 131/43  Pulse: 73 62 76 71  Resp:   20 16  Temp:   98.6 F (37 C) 98.1 F (36.7 C)  TempSrc:   Oral Oral  SpO2:   95% 91%  Weight:    69.4 kg  Height:       No intake or output data in the 24 hours ending 08/06/19 1255 Weight change: 0 kg Exam:   General:  Pt is alert, follows commands appropriately, not in acute distress  HEENT: No icterus, No thrush, No neck mass, [place]/AT  Cardiovascular: RRR, S1/S2, no rubs, no gallops  Respiratory: fine bibasilar crackles. No wheeze  Abdomen: Soft/+BS, non tender, non distended, no guarding  Extremities: No edema, No lymphangitis, No petechiae,  No rashes, no synovitis;  Negative straight leg raise   Data Reviewed: I have personally reviewed following labs and imaging  studies Basic Metabolic Panel: Recent Labs  Lab 08/01/19 1249 08/03/19 1120 08/03/19 1651 08/04/19 0443 08/05/19 1445  NA 137 135  --  133* 131*  K 4.1 5.7*  --  3.8 4.5  CL 98 96*  --  91* 90*  CO2 26 19*  --  27 26  GLUCOSE 151* 187*  --  88 133*  BUN 16 40*  --  15 36*  CREATININE 3.93* 7.13*  --  3.25* 5.69*  CALCIUM 8.6* 8.1*  --  7.9* 8.2*  MG  --   --  2.3  --   --   PHOS  --   --  7.0*  --  6.4*   Liver Function Tests: Recent Labs  Lab 08/05/19 1445  ALBUMIN 2.8*   No results for input(s): LIPASE, AMYLASE in the last 168 hours. No results for input(s): AMMONIA in the last 168 hours. Coagulation Profile: Recent Labs  Lab 08/01/19 1249 08/03/19 1651 08/04/19 0443 08/05/19 0504 08/06/19 0445  INR 1.3* 4.3* 3.6* 2.7* 2.5*   CBC: Recent Labs  Lab 08/01/19 1249 08/03/19 1120 08/04/19 0443 08/05/19 1445  WBC 13.9* 8.2 7.9 6.9  NEUTROABS 12.1* 7.2  --   --   HGB 9.4* 9.9* 8.6* 9.0*  HCT 32.3* 34.8* 28.3* 29.2*  MCV 107.3* 108.8* 100.7* 100.7*  PLT 224 253 233 306   Cardiac Enzymes: No results for input(s): CKTOTAL, CKMB, CKMBINDEX, TROPONINI in the last 168 hours. BNP: Invalid input(s): POCBNP CBG: Recent Labs  Lab 08/05/19 1114 08/05/19 1613 08/05/19 2049 08/06/19 0725 08/06/19 1243  GLUCAP 88 104* 97 100* 100*   HbA1C: No results for input(s): HGBA1C in the last 72 hours. Urine analysis:    Component Value Date/Time   COLORURINE AMBER (A) 05/19/2019 0919   APPEARANCEUR HAZY (A) 05/19/2019 0919   LABSPEC 1.016 05/19/2019 0919   PHURINE 5.0 05/19/2019 0919   GLUCOSEU NEGATIVE 05/19/2019 0919   HGBUR NEGATIVE 05/19/2019 0919   BILIRUBINUR NEGATIVE 05/19/2019 0919   KETONESUR NEGATIVE 05/19/2019 0919   PROTEINUR 100 (A) 05/19/2019 0919   UROBILINOGEN 0.2 02/08/2015 1615   NITRITE NEGATIVE 05/19/2019 0919   LEUKOCYTESUR NEGATIVE 05/19/2019 0919   Sepsis Labs: @LABRCNTIP (procalcitonin:4,lacticidven:4) ) Recent Results (from the past  240 hour(s))  Blood culture (routine x 2)     Status: None (Preliminary result)   Collection Time: 08/03/19 11:21 AM   Specimen: BLOOD LEFT HAND  Result Value Ref Range Status   Specimen Description BLOOD LEFT HAND  Final   Special Requests   Final    BOTTLES DRAWN AEROBIC ONLY Blood Culture results may not be optimal due to an inadequate volume of blood received in culture bottles   Culture   Final    NO GROWTH 3 DAYS Performed at [facility], [address] [place], [place] [address]    Report Status PENDING  Incomplete  Blood culture (routine x 2)     Status: None (Preliminary result)   Collection Time: 08/03/19  1:22 PM   Specimen: BLOOD RIGHT ARM  Result Value Ref Range Status   Specimen Description BLOOD RIGHT ARM  Final   Special Requests   Final    BOTTLES DRAWN AEROBIC AND ANAEROBIC Blood Culture adequate volume   Culture   Final    NO GROWTH 3 DAYS Performed at [facility], [address], [place], [place] [address]  Report Status PENDING  Incomplete  Respiratory Panel by RT PCR (Flu A&B, Covid) - Nasopharyngeal Swab     Status: None   Collection Time: 08/03/19  5:09 PM   Specimen: Nasopharyngeal Swab  Result Value Ref Range Status   SARS Coronavirus 2 by RT PCR NEGATIVE NEGATIVE Final    Comment: (NOTE) SARS-CoV-2 target nucleic acids are NOT DETECTED. The SARS-CoV-2 RNA is generally detectable in upper respiratoy specimens during the acute phase of infection. The lowest concentration of SARS-CoV-2 viral copies this assay can detect is 131 copies/mL. A negative result does not preclude SARS-Cov-2 infection and should not be used as the sole basis for treatment or other patient management decisions. A negative result may occur with  improper specimen collection/handling, submission of specimen other than nasopharyngeal swab, presence of viral mutation(s) within the areas targeted by this assay, and inadequate number of viral copies (<131 copies/mL). A negative  result must be combined with clinical observations, patient history, and epidemiological information. The expected result is Negative. Fact Sheet for Patients:  PinkCheek.be Fact Sheet for Healthcare Providers:  GravelBags.it This test is not yet ap proved or cleared by the [place] FDA and  has been authorized for detection and/or diagnosis of SARS-CoV-2 by FDA under an Emergency Use Authorization (EUA). This EUA will remain  in effect (meaning this test can be used) for the duration of the COVID-19 declaration under Section 564(b)(1) of the Act, 21 U.S.C. section 360bbb-3(b)(1), unless the authorization is terminated or revoked sooner.    Influenza A by PCR NEGATIVE NEGATIVE Final   Influenza B by PCR NEGATIVE NEGATIVE Final    Comment: (NOTE) The Xpert Xpress SARS-CoV-2/FLU/RSV assay is intended as an aid in  the diagnosis of influenza from Nasopharyngeal swab specimens and  should not be used as a sole basis for treatment. Nasal washings and  aspirates are unacceptable for Xpert Xpress SARS-CoV-2/FLU/RSV  testing. Fact Sheet for Patients: PinkCheek.be Fact Sheet for Healthcare Providers: GravelBags.it This test is not yet approved or cleared by the [place] FDA and  has been authorized for detection and/or diagnosis of SARS-CoV-2 by  FDA under an Emergency Use Authorization (EUA). This EUA will remain  in effect (meaning this test can be used) for the duration of the  Covid-19 declaration under Section 564(b)(1) of the Act, 21  U.S.C. section 360bbb-3(b)(1), unless the authorization is  terminated or revoked. Performed at [facility], [address] [place], [place] [address]   MRSA PCR Screening     Status: None   Collection Time: 08/03/19  9:52 PM   Specimen: Nasal Mucosa; Nasopharyngeal  Result Value Ref Range Status   MRSA by PCR NEGATIVE  NEGATIVE Final    Comment:        The GeneXpert MRSA Assay (FDA approved for NASAL specimens only), is one component of a comprehensive MRSA colonization surveillance program. It is not intended to diagnose MRSA infection nor to guide or monitor treatment for MRSA infections. Performed at [facility], [address] [place], [place] [address]      Scheduled Meds: . amiodarone  200 mg Oral Daily  . aspirin EC  81 mg Oral Daily  . calcium acetate  667-2,001 mg Oral TID WC  . Chlorhexidine Gluconate Cloth  6 each Topical Q0600  . darbepoetin (ARANESP) injection - DIALYSIS  60 mcg Intravenous Q Tue-HD  . febuxostat  40 mg Oral Daily  . gabapentin  300 mg Oral BID  . insulin aspart  0-6  Units Subcutaneous TID WC  . lidocaine  1 patch Transdermal Q24H  . mouth rinse  15 mL Mouth Rinse BID  . multivitamin with minerals  1 tablet Oral Daily  . pantoprazole  40 mg Oral Daily  . senna-docusate  1 tablet Oral QHS  . sodium chloride flush  3 mL Intravenous Q12H  . thyroid  120 mg Oral Daily  . cholecalciferol  5,000 Units Oral Daily  . warfarin  7.5 mg Oral Once  . Warfarin - Pharmacist Dosing Inpatient   Does not apply q1800   Continuous Infusions: . sodium chloride    . sodium chloride    . sodium chloride      Procedures/Studies: DG Ribs Unilateral W/Chest Right  Result Date: 08/01/2019 CLINICAL DATA:  Right posterior rib tenderness following a fall this morning. EXAM: RIGHT RIBS AND CHEST - 3+ VIEW COMPARISON:  Portable chest dated 05/19/2019 FINDINGS: Normal sized heart. Post CABG changes. Small amount of interval linear atelectasis or scarring at the lung bases. Stable left proximal humerus fixation hardware. Old, healed right rib fractures with no acute rib fractures or pneumothorax seen. Previously described T6 and T7 vertebral compression deformities, better visualized on the thoracic spine radiographs obtained at the same time. IMPRESSION: 1. No acute rib fractures seen. 2.  Small amount of interval bibasilar linear atelectasis or scarring. 3. Separately described interval T6 and T7 vertebral compression deformities. Electronically Signed   By: Claudie Revering M.D.   On: 08/01/2019 13:45   DG Thoracic Spine 2 View  Result Date: 08/01/2019 CLINICAL DATA:  Midline back tenderness following a fall. Upper back pain. EXAM: THORACIC SPINE 2 VIEWS COMPARISON:  Chest radiographs dated 01/05/2019 FINDINGS: Site scratch the interval approximately 20% inferior endplate compression deformity of the T6 vertebral body and approximately 30% compression deformity of the T7 vertebral body. No significant bony retropulsion visualized. No well visualized acute fracture lines. Post CABG changes and previously demonstrated left humerus fixation hardware. Thoracolumbar spine degenerative changes. IMPRESSION: 1. Interval approximately 20% inferior endplate compression deformity of the T6 vertebral body and approximately 30% compression deformity of the T7 vertebral body. 2. Thoracolumbar spine degenerative changes. Electronically Signed   By: Claudie Revering M.D.   On: 08/01/2019 13:42   CT THORACIC SPINE WO CONTRAST  Result Date: 08/05/2019 CLINICAL DATA:  [place] on 08/01/2019.  Back pain. EXAM: CT THORACIC SPINE WITHOUT CONTRAST TECHNIQUE: Multidetector CT images of the thoracic were obtained using the standard protocol without intravenous contrast. COMPARISON:  Radiography 08/01/2019.  CT chest 11/07/2018 2 FINDINGS: Alignment: Normal Vertebrae: The bones appear in general osteopenic. Widespread ankylosis of the spine. There do appear to be recent fractures at T6 and T7 with minimal loss of height. Fracture extends in an oblique fashion from the lower right vertebral body at T7, ascending upwards towards the left of the T7 vertebral body. The fracture involves the previously solid anterior osteophyte or syndesmophyte column. I can not show any involvement of the posterior elements, therefore it would be  presumed stable. Paraspinal and other soft tissues: Bilateral pleural effusions layering dependently with dependent atelectasis, left more than right. Disc levels: No significant disc space pathology. No bony stenosis of the canal or foramina. 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 Oblique fracture through the T7 vertebral body. Minimal loss of height of T6 and T7, no more than 10-15%. No retropulsed bone. No evidence of extension of the fracture through the fused posterior elements. Electronically Signed  By: Nelson Chimes M.D.   On: 08/05/2019 19:44   DG Chest Portable 1 View  Result Date: 08/03/2019 CLINICAL DATA:  Altered level of consciousness, bradycardia EXAM: PORTABLE CHEST 1 VIEW COMPARISON:  08/01/2019 FINDINGS: Cardiomegaly. Prior CABG. Elevation of the left hemidiaphragm with left base atelectasis and small left effusion. No confluent opacity on the right. Mild vascular congestion. IMPRESSION: Mild cardiomegaly, vascular congestion. Elevation of the left hemidiaphragm with left base atelectasis and small left effusion. Electronically Signed   By: Rolm Baptise M.D.   On: 08/03/2019 11:58   ECHOCARDIOGRAM COMPLETE  Result Date: 08/04/2019    ECHOCARDIOGRAM REPORT   Patient Name:   [name] Date of Exam: 08/04/2019 Medical Rec #:  [id number]        Height:       64.0 in Accession #:    [id number]       Weight:       164.5 lb Date of Birth:  July 19, 1940        BSA:          1.800 m Patient Age:    50 years         BP:           139/42 mmHg Patient Gender: F                HR:           68 bpm. Exam Location:  [facility] Procedure: 2D Echo Indications:    Atrial Fibrillation 427.31 / I48.91  History:        Patient has prior history of Echocardiogram examinations, most                 recent 11/10/2018. Arrythmias:Atrial Fibrillation; Risk                 Factors:Hypertension, Dyslipidemia and Diabetes. ESRD                  Respiratory failure with hypoxia.  Sonographer:    Vikki Ports Turrentine Referring Phys: [place]  1. Left ventricular ejection fraction, by estimation, is 65 to 70%. The left ventricle has normal function. The left ventricle has no regional wall motion abnormalities. Left ventricular diastolic parameters are consistent with Grade II diastolic dysfunction (pseudonormalization).  2. Right ventricular systolic function is normal. The right ventricular size is normal. There is mildly elevated pulmonary artery systolic pressure. The estimated right ventricular systolic pressure is 69.6 mmHg.  3. Left atrial size was upper normal.  4. The mitral valve is degenerative. Mild mitral valve regurgitation.  5. The aortic valve is tricuspid. Aortic valve regurgitation is not visualized.  6. The inferior vena cava is normal in size with greater than 50% respiratory variability, suggesting right atrial pressure of 3 mmHg. FINDINGS  Left Ventricle: Left ventricular ejection fraction, by estimation, is 65 to 70%. The left ventricle has normal function. The left ventricle has no regional wall motion abnormalities. The left ventricular internal cavity size was normal in size. There is  no left ventricular hypertrophy. Left ventricular diastolic parameters are consistent with Grade II diastolic dysfunction (pseudonormalization). Right Ventricle: The right ventricular size is normal. No increase in right ventricular wall thickness. Right ventricular systolic function is normal. There is mildly elevated pulmonary artery systolic pressure. The tricuspid regurgitant velocity is 2.88  m/s, and with an assumed right atrial pressure of 3 mmHg, the estimated right ventricular systolic pressure is 29.5 mmHg. Left Atrium: Left atrial size  was upper normal. Right Atrium: Right atrial size was normal in size. Pericardium: There is no evidence of pericardial effusion. Mitral Valve: The mitral valve is degenerative in  appearance. There is mild calcification of the mitral valve leaflet(s). Mild mitral annular calcification. Mild mitral valve regurgitation. Tricuspid Valve: The tricuspid valve is grossly normal. Tricuspid valve regurgitation is mild. Aortic Valve: The aortic valve is tricuspid. Aortic valve regurgitation is not visualized. Mild aortic valve annular calcification. Aortic valve mean gradient measures 7.0 mmHg. Aortic valve peak gradient measures 12.7 mmHg. Aortic valve area, by VTI measures 2.05 cm. Pulmonic Valve: The pulmonic valve was grossly normal. Pulmonic valve regurgitation is not visualized. Aorta: The aortic root is normal in size and structure. Venous: The inferior vena cava is normal in size with greater than 50% respiratory variability, suggesting right atrial pressure of 3 mmHg. IAS/Shunts: No atrial level shunt detected by color flow Doppler.  LEFT VENTRICLE PLAX 2D LVIDd:         4.98 cm  Diastology LVIDs:         3.44 cm  LV e' lateral:   14.00 cm/s LV PW:         0.74 cm  LV E/e' lateral: 9.3 LV IVS:        0.83 cm  LV e' medial:    4.57 cm/s LVOT diam:     1.80 cm  LV E/e' medial:  28.4 LV SV:         88 LV SV Index:   49 LVOT Area:     2.54 cm  RIGHT VENTRICLE RV S prime:     8.70 cm/s TAPSE (M-mode): 1.7 cm LEFT ATRIUM             Index       RIGHT ATRIUM           Index LA diam:        4.10 cm 2.28 cm/m  RA Area:     19.40 cm LA Vol (A2C):   42.7 ml 23.72 ml/m RA Volume:   56.80 ml  31.55 ml/m LA Vol (A4C):   58.8 ml 32.66 ml/m LA Biplane Vol: 53.3 ml 29.61 ml/m  AORTIC VALVE AV Area (Vmax):    1.72 cm AV Area (Vmean):   1.89 cm AV Area (VTI):     2.05 cm AV Vmax:           178.00 cm/s AV Vmean:          122.000 cm/s AV VTI:            0.431 m AV Peak Grad:      12.7 mmHg AV Mean Grad:      7.0 mmHg LVOT Vmax:         120.00 cm/s LVOT Vmean:        90.400 cm/s LVOT VTI:          0.347 m LVOT/AV VTI ratio: 0.81  AORTA Ao Root diam: 2.60 cm MITRAL VALVE                TRICUSPID VALVE MV  Area (PHT): 6.54 cm     TR Peak grad:   33.2 mmHg MV Decel Time: 116 msec     TR Vmax:        288.00 cm/s MV E velocity: 130.00 cm/s MV A velocity: 111.00 cm/s  SHUNTS MV E/A ratio:  1.17         Systemic VTI:  0.35 m  Systemic Diam: 1.80 cm Rozann Lesches MD Electronically signed by Rozann Lesches MD Signature Date/Time: 08/04/2019/10:07:37 AM    Final     Orson Eva, DO  [facility]  If 7PM-7AM, please contact night-coverage [email or web address] Password [facility] 08/06/2019, 12:55 PM   LOS: 2 days

## 2019-08-06 NOTE — Progress Notes (Signed)
  [facility] Progress Note   Assessment/ Plan:   1. Hyperkalemia, mild; resolved with HD 2. ESRD, MWF, RUEAVF, [name]-- s/p HD 3/10, next planned 3/12.   3. Confusion/AMS/encephalopathy: Improved; likely narcotic related, improved with Narcan--> in setting of severe pain primary to add back some narcotics judiciously 4. Recent fall with T6 and T7 compression fractures, acutely painful--> per primary, pain management and consideration of kyphoplasty although not a lot of vertebral body loss 5. AFib with Bradycardia, junctional at times--> cardiology consulted, appreciate assistance.  6. Metabolic acidosis, resolved 7. Anemia, hemoglobin 8.6 8. Hypoxia, chest x-ray with some vascular congestio; stable resp status post HD 9. Dispo: ? SNF  Subjective:    S/p HD yesterday with 2L removed.  Severe back pain continues.  CT thoracic spine with 10-15% loss of height T7 vertebral body.     Objective:   BP (!) 131/43 (BP Location: Left Arm)   Pulse 71   Temp 98.1 F (36.7 C) (Oral)   Resp 16   Ht 5\' 4"  (1.626 m)   Wt 69.4 kg   SpO2 91%   BMI 26.26 kg/m   Physical Exam: Gen: appears uncomfortable, lying in bed, getting IV started CVS: RRR Resp: clear bilaterally Abd: soft, nontender Ext: no LE edema ACCESS: R AVF +T/B  Labs: BMET Recent Labs  Lab 08/01/19 1249 08/03/19 1120 08/03/19 1651 08/04/19 0443 08/05/19 1445  NA 137 135  --  133* 131*  K 4.1 5.7*  --  3.8 4.5  CL 98 96*  --  91* 90*  CO2 26 19*  --  27 26  GLUCOSE 151* 187*  --  88 133*  BUN 16 40*  --  15 36*  CREATININE 3.93* 7.13*  --  3.25* 5.69*  CALCIUM 8.6* 8.1*  --  7.9* 8.2*  PHOS  --   --  7.0*  --  6.4*   CBC Recent Labs  Lab 08/01/19 1249 08/03/19 1120 08/04/19 0443 08/05/19 1445  WBC 13.9* 8.2 7.9 6.9  NEUTROABS 12.1* 7.2  --   --   HGB 9.4* 9.9* 8.6* 9.0*  HCT 32.3* 34.8* 28.3* 29.2*  MCV 107.3* 108.8* 100.7* 100.7*  PLT 224 253 233 306      Medications:    .  amiodarone  200 mg Oral Daily  . aspirin EC  81 mg Oral Daily  . calcium acetate  667-2,001 mg Oral TID WC  . Chlorhexidine Gluconate Cloth  6 each Topical Q0600  . darbepoetin (ARANESP) injection - DIALYSIS  60 mcg Intravenous Q Tue-HD  . febuxostat  40 mg Oral Daily  . gabapentin  300 mg Oral BID  . insulin aspart  0-6 Units Subcutaneous TID WC  . lidocaine  1 patch Transdermal Q24H  . mouth rinse  15 mL Mouth Rinse BID  . multivitamin with minerals  1 tablet Oral Daily  . pantoprazole  40 mg Oral Daily  . senna-docusate  1 tablet Oral QHS  . sodium chloride flush  3 mL Intravenous Q12H  . thyroid  120 mg Oral Daily  . cholecalciferol  5,000 Units Oral Daily  . warfarin  7.5 mg Oral Once  . Warfarin - Pharmacist Dosing Inpatient   Does not apply q1800     Madelon Lips MD 08/06/2019, 10:32 AM

## 2019-08-06 NOTE — Progress Notes (Signed)
   Progress Note  Patient Name: [name] Date of Encounter: 08/06/2019  Primary Cardiologist: Carlyle Dolly, MD  I reviewed her telemetry.  No recurrent episodes of bradycardia, no pauses. Vital signs are stable this morning.  [facility] will sign off.   Medication Recommendations: Continue aspirin, oral amiodarone, and Coumadin.  Would leave her off Toprol-XL, this can be reassessed as an outpatient.  She was also on Norvasc as an outpatient, this can be reintroduced depending on blood pressure. Other recommendations (labs, testing, etc): No further inpatient cardiac testing is planned. Follow up as an outpatient: We will arrange follow-up with Dr. Harl Bowie in the next few weeks.  Signed, Rozann Lesches, MD  08/06/2019, 9:24 AM

## 2019-08-06 NOTE — Consult Note (Signed)
Chief Complaint: Patient was seen in consultation today for possible T6 / T7 Kyphoplasty Chief Complaint  Patient presents with  . Bradycardia   at the request of Dr D Tat   Supervising Physician: Luanne Bras  Patient Status: [facility] IP  History of Present Illness: [name] is a 79 y.o. 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 Oblique fracture through the T7 vertebral body. Minimal loss of height of T6 and T7, no more than 10-15%. No retropulsed bone. No evidence of extension of the fracture through the fused posterior elements.  Dr Tat has requested evaluation for KP I have reviewed with Dr Estanislado Pandy He recommends MRI T spine   I have spoken to Dr Tat I ordered Tspine MR We will await result     Past Medical History:  Diagnosis Date  . Anemia   . Arthritis   . Blood transfusion without reported diagnosis   . CAD (coronary artery disease)    a. 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  . Cataract   . Chronic anemia   . Chronic combined systolic and diastolic CHF (congestive heart failure) ([place])    a. 2D echo 08/2016 at [facility]: EF 50-55% with inferior wall HK, impaired LV filling, fair study.  . ESRD on hemodialysis ([place])   . Essential hypertension   . Gastritis   . GERD (gastroesophageal reflux disease)   . Gout   . Headache   . Hyperlipidemia   . Hypothyroidism   . Iron deficiency anemia 11/08/2016  . NSTEMI (non-ST elevated myocardial infarction) ([place])    a.  Complex admission 08/2016 - with severe hyperglycemia, AKI on CKD, severe anemia down to Hgb 6.8, acute combined CHF, troponin of 8.5, cath deferred due to renal dysfunction.  . Paroxysmal atrial fibrillation ([place])   . Type 2 diabetes mellitus ([place])   . Wears dentures     Past Surgical History:  Procedure Laterality Date  . BASCILIC VEIN TRANSPOSITION Right 06/12/2018   Procedure: FIRST STAGE BASCILIC VEIN TRANSPOSITION RIGHT ARM;  Surgeon: Rosetta Posner, MD;  Location: [place];  Service: Vascular;  Laterality: Right;  . BASCILIC VEIN TRANSPOSITION Right 08/07/2018   Procedure: BASCILIC VEIN TRANSPOSITION SECOND STAGE RIGHT ARM;  Surgeon: Rosetta Posner, MD;  Location: [place];  Service: Vascular;  Laterality: Right;  . CATARACT EXTRACTION     left  . COLONOSCOPY WITH PROPOFOL N/A 10/08/2016   5 mm transverse colon polyp note resected due to plavix. hemorrhoids  . CORONARY ARTERY BYPASS GRAFT N/A 11/20/2018   Procedure: CORONARY ARTERY BYPASS GRAFTING (CABG), ON PUMP, TIMES four, USING LEFT INTERNAL MAMMARY ARTERY AND ENDOSCOPICALLY HARVESTED RIGHT GREATER SAPHENOUS VEIN;  Surgeon: Melrose Nakayama, MD;  Location: [place];  Service: Open Heart Surgery;  Laterality: N/A;  . ENTEROSCOPY N/A 08/08/2017   Procedure: ENTEROSCOPY;  Surgeon: Daneil Dolin, MD;  [name];  Service: Endoscopy;  Laterality: N/A;  . ESOPHAGOGASTRODUODENOSCOPY N/A 10/06/2016   mild chroni gastritis, negative H.pylori  . ESOPHAGOGASTRODUODENOSCOPY (EGD) WITH  PROPOFOL N/A 03/12/2017   mild edema/erythema of stomach, small bowel biopsy with focal villous tip lymphocytosis, ?partially developed celiac  . 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 GIVENS CAPSULE STUDY  10/08/2016   normal  .  GIVENS CAPSULE STUDY N/A 10/29/2016   occasional gastric erosion, unremarkable small bowel  . LEFT HEART CATH AND CORONARY ANGIOGRAPHY N/A 11/12/2018   Procedure: LEFT HEART CATH AND CORONARY ANGIOGRAPHY;  Surgeon: Burnell Blanks, MD;  Location: [place] [facility];  Service: Cardiovascular;  Laterality: N/A;  . ORIF HUMERUS FRACTURE Left 02/07/2015   Procedure: OPEN REDUCTION INTERNAL FIXATION (ORIF) PROXIMAL HUMERUS FRACTURE;  Surgeon: Marybelle Killings, MD;  Location: [place];  Service: Orthopedics;  Laterality: Left;  . TEE WITHOUT CARDIOVERSION N/A 11/20/2018   Procedure: TRANSESOPHAGEAL ECHOCARDIOGRAM (TEE);  Surgeon: Melrose Nakayama, MD;  Location: [place];  Service: Open Heart Surgery;  Laterality: N/A;  . teeth extractions    . THYROID SURGERY    . TOTAL HIP ARTHROPLASTY Left 02/07/2015   Procedure: TOTAL HIP ARTHROPLASTY ANTERIOR APPROACH ;  Surgeon: Marybelle Killings, MD;  Location: [place];  Service: Orthopedics;  Laterality: Left;  . WRIST SURGERY Left     Allergies: Insulin glargine and Statins  Medications: Prior to Admission medications   Medication Sig Start Date End Date Taking? Authorizing Provider  acetaminophen (TYLENOL) 325 MG tablet Take 2 tablets (650 mg total) by mouth every 6 (six) hours as needed for mild pain or headache. 11/28/18  Yes Lars Pinks M, PA-C  amiodarone (PACERONE) 200 MG tablet Take 1 tablet (200 mg total) by mouth daily. 01/28/19  Yes Arnoldo Lenis, MD  aspirin EC 81 MG EC tablet Take 1 tablet (81 mg total) by mouth daily. 11/28/18  Yes Lars Pinks M, PA-C  calcium acetate (PHOSLO) 667 MG capsule Take 667-2,001 mg by mouth in the morning, at noon, in the evening, and at bedtime. Take 3 capsules with each meal and 1 capsule with snacks daily   Yes [provider]  Cholecalciferol (VITAMIN D-3) 125 MCG (5000 UT) TABS Take 5,000 mg by mouth daily.    Yes [provider]  Evolocumab (REPATHA SURECLICK) 619 MG/ML SOAJ Inject 140  mg into the skin every 14 (fourteen) days. 01/06/19  Yes BranchAlphonse Guild, MD  febuxostat (ULORIC) 40 MG tablet Take 1 tablet by mouth once daily 06/16/19  Yes Gosrani, Nimish C, MD  gabapentin (NEURONTIN) 300 MG capsule Take 1 capsule by mouth 2 (two) times daily as needed.  06/12/19  Yes [provider]  HYDROcodone-acetaminophen (NORCO/VICODIN) 5-325 MG tablet Take 1 tablet by mouth every 6 (six) hours as needed for severe pain. 08/01/19  Yes Joy, Shawn C, PA-C  metoprolol succinate (TOPROL XL) 25 MG 24 hr tablet Take 0.5 tablets (12.5 mg total) by mouth daily. Patient taking differently: Take 12.5 mg by mouth every Monday, Wednesday, and Friday. Monday Wednesday Friday 11/28/18 11/28/19 Yes Lars Pinks M, PA-C  Multiple Vitamin (MULTIVITAMIN WITH MINERALS) TABS tablet Take 1 tablet by mouth daily.   Yes [provider]  NP THYROID 120 MG tablet Take 1 tablet by mouth once daily 07/22/19 08/21/19 Yes Gosrani, Nimish C, MD  ondansetron (ZOFRAN) 4 MG tablet TAKE 1 TABLET BY MOUTH EVERY 8 HOURS AS NEEDED FOR NAUSEA AND VOMITING Patient  taking differently: Take 4 mg by mouth every 8 (eight) hours as needed for nausea or vomiting.  07/14/19  Yes Mahala Menghini, PA-C  pantoprazole (PROTONIX) 40 MG tablet TAKE 1 TABLET BY MOUTH TWICE DAILY 30 MINUTES BEFORE BREAKFAST Patient taking differently: Take 40 mg by mouth daily.  07/10/19  Yes Annitta Needs, NP  warfarin (COUMADIN) 2.5 MG tablet TAKE 1 & 1/2 (ONE & ONE-HALF) TABLETS DAILY EXCEPT 1 TABLET ON TUESDAYS AND SATURDAYS OR AS DIRECTED Patient taking differently: Take 2.5-5 mg by mouth daily at 6 PM. TAKE 1 & 1/2 (ONE & ONE-HALF) TABLETS DAILY EXCEPT 1 TABLET ON TUESDAYS AND SATURDAYS OR AS DIRECTED  Per family, patient had a dosing guide that has recently ran out and was supposed to have warfarin adjusted - patient last had warfarin 2.5mg  filled on 07/28/19 with dosing per pharmacy records that indicated that patient would take 3.75mg   every day except on tuesdays and saturdays, when patient would take 2.5mg . Family stated that warfarin was to be adjusted that patient takes 5mg  on certain days but unsure of day. However, family did state that patient took 5mg  on 08/02/2019 at 1800 07/28/19  Yes Branch, Alphonse Guild, MD  amLODipine (NORVASC) 10 MG tablet Take 1 tablet by mouth once daily Patient taking differently: Take 10 mg by mouth daily. Jory Sims, Fri 05/12/19   Pixie Casino, MD  gabapentin (NEURONTIN) 300 MG capsule Take 2 capsules (600 mg total) by mouth at bedtime. 11/18/17   Mahala Menghini, PA-C  gabapentin (NEURONTIN) 300 MG capsule TAKE 2 CAPSULES BY MOUTH ONCE DAILY AT NIGHT 01/30/19   Doree Albee, MD  NP THYROID 120 MG tablet Take 120 mg by mouth daily. 08/28/18   [provider]  NP THYROID 120 MG tablet Take 1 tablet by mouth once daily 06/16/19   Doree Albee, MD     Family History  Problem Relation Age of Onset  . Diabetes Mother   . Asthma Mother   . Early death Mother 81       Pneumonia  . Early death Father        Killed at rodeo  . CAD Neg Hx   . GI Bleed Neg Hx     Social History   Socioeconomic History  . Marital status: Widowed    Spouse name: [name]  . Number of children: 2  . Years of education: 19  . Highest education level: Not on file  Occupational History  . Occupation: retired  Tobacco Use  . Smoking status: Never Smoker  . Smokeless tobacco: Never Used  Substance and Sexual Activity  . Alcohol use: No    Alcohol/week: 0.0 standard drinks  . Drug use: No  . Sexual activity: Not Currently  Other Topics Concern  . Not on file  Social History Narrative   Lives with significant other/partner [name]   He is her caregiver   Moved from [facility]      Her Son Nigeria Lasseter (lives In [place]) -- ph # [phone] ( would like to be called about care issues)   Social Determinants of Health   Financial Resource Strain:   . Difficulty of Paying Living Expenses:   Food  Insecurity:   . Worried About Charity fundraiser in the Last Year:   . Arboriculturist in the Last Year:   Transportation Needs:   . Film/video editor (Medical):   Marland Kitchen Lack of Transportation (Non-Medical):   Physical  Activity:   . Days of Exercise per Week:   . Minutes of Exercise per Session:   Stress:   . Feeling of Stress :   Social Connections:   . Frequency of Communication with Friends and Family:   . Frequency of Social Gatherings with Friends and Family:   . Attends Religious Services:   . Active Member of Clubs or Organizations:   . Attends Archivist Meetings:   Marland Kitchen Marital Status:     Review of Systems: A 12 point ROS discussed and pertinent positives are indicated in the HPI above.  All other systems are negative.  Review of Systems  Constitutional: Positive for activity change and appetite change. Negative for fever and unexpected weight change.  Respiratory: Negative for cough and shortness of breath.   Cardiovascular: Negative for chest pain.  Gastrointestinal: Positive for abdominal pain and constipation.  Musculoskeletal: Positive for back pain and gait problem.  Neurological: Negative for weakness.  Psychiatric/Behavioral: Negative for behavioral problems and confusion.    Vital Signs: BP (!) 131/43 (BP Location: Left Arm)   Pulse 71   Temp 98.1 F (36.7 C) (Oral)   Resp 16   Ht 5\' 4"  (1.626 m)   Wt 153 lb (69.4 kg)   SpO2 91%   BMI 26.26 kg/m   Physical Exam Vitals reviewed.  Cardiovascular:     Rate and Rhythm: Normal rate and regular rhythm.     Heart sounds: Normal heart sounds.  Pulmonary:     Effort: Pulmonary effort is normal.     Breath sounds: Normal breath sounds.  Abdominal:     Palpations: Abdomen is soft.     Tenderness: There is abdominal tenderness.  Musculoskeletal:        General: Normal range of motion.     Comments: Point tenderness at T6-T7 area Moving all 4s No weakness in extremities   Skin:    General:  Skin is warm and dry.  Neurological:     Mental Status: She is alert and oriented to person, place, and time.  Psychiatric:        Behavior: Behavior normal.     Imaging: DG Ribs Unilateral W/Chest Right  Result Date: 08/01/2019 CLINICAL DATA:  Right posterior rib tenderness following a fall this morning. EXAM: RIGHT RIBS AND CHEST - 3+ VIEW COMPARISON:  Portable chest dated 05/19/2019 FINDINGS: Normal sized heart. Post CABG changes. Small amount of interval linear atelectasis or scarring at the lung bases. Stable left proximal humerus fixation hardware. Old, healed right rib fractures with no acute rib fractures or pneumothorax seen. Previously described T6 and T7 vertebral compression deformities, better visualized on the thoracic spine radiographs obtained at the same time. IMPRESSION: 1. No acute rib fractures seen. 2. Small amount of interval bibasilar linear atelectasis or scarring. 3. Separately described interval T6 and T7 vertebral compression deformities. Electronically Signed   By: Claudie Revering M.D.   On: 08/01/2019 13:45   DG Thoracic Spine 2 View  Result Date: 08/01/2019 CLINICAL DATA:  Midline back tenderness following a fall. Upper back pain. EXAM: THORACIC SPINE 2 VIEWS COMPARISON:  Chest radiographs dated 01/05/2019 FINDINGS: Site scratch the interval approximately 20% inferior endplate compression deformity of the T6 vertebral body and approximately 30% compression deformity of the T7 vertebral body. No significant bony retropulsion visualized. No well visualized acute fracture lines. Post CABG changes and previously demonstrated left humerus fixation hardware. Thoracolumbar spine degenerative changes. IMPRESSION: 1. Interval approximately 20% inferior endplate compression deformity of  the T6 vertebral body and approximately 30% compression deformity of the T7 vertebral body. 2. Thoracolumbar spine degenerative changes. Electronically Signed   By: Claudie Revering M.D.   On: 08/01/2019  13:42   CT THORACIC SPINE WO CONTRAST  Result Date: 08/05/2019 CLINICAL DATA:  [place] on 08/01/2019.  Back pain. EXAM: CT THORACIC SPINE WITHOUT CONTRAST TECHNIQUE: Multidetector CT images of the thoracic were obtained using the standard protocol without intravenous contrast. COMPARISON:  Radiography 08/01/2019.  CT chest 11/07/2018 2 FINDINGS: Alignment: Normal Vertebrae: The bones appear in general osteopenic. Widespread ankylosis of the spine. There do appear to be recent fractures at T6 and T7 with minimal loss of height. Fracture extends in an oblique fashion from the lower right vertebral body at T7, ascending upwards towards the left of the T7 vertebral body. The fracture involves the previously solid anterior osteophyte or syndesmophyte column. I can not show any involvement of the posterior elements, therefore it would be presumed stable. Paraspinal and other soft tissues: Bilateral pleural effusions layering dependently with dependent atelectasis, left more than right. Disc levels: No significant disc space pathology. No bony stenosis of the canal or foramina. 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 Oblique fracture through the T7 vertebral body. Minimal loss of height of T6 and T7, no more than 10-15%. No retropulsed bone. No evidence of extension of the fracture through the fused posterior elements. Electronically Signed   By: Nelson Chimes M.D.   On: 08/05/2019 19:44   DG Chest Portable 1 View  Result Date: 08/03/2019 CLINICAL DATA:  Altered level of consciousness, bradycardia EXAM: PORTABLE CHEST 1 VIEW COMPARISON:  08/01/2019 FINDINGS: Cardiomegaly. Prior CABG. Elevation of the left hemidiaphragm with left base atelectasis and small left effusion. No confluent opacity on the right. Mild vascular congestion. IMPRESSION: Mild cardiomegaly, vascular congestion. Elevation of the left hemidiaphragm with left base atelectasis  and small left effusion. Electronically Signed   By: Rolm Baptise M.D.   On: 08/03/2019 11:58   ECHOCARDIOGRAM COMPLETE  Result Date: 08/04/2019    ECHOCARDIOGRAM REPORT   Patient Name:   [name] Date of Exam: 08/04/2019 Medical Rec #:  [id number]        Height:       64.0 in Accession #:    [id number]       Weight:       164.5 lb Date of Birth:  1940/08/28        BSA:          1.800 m Patient Age:    45 years         BP:           139/42 mmHg Patient Gender: F                HR:           68 bpm. Exam Location:  [facility] Procedure: 2D Echo Indications:    Atrial Fibrillation 427.31 / I48.91  History:        Patient has prior history of Echocardiogram examinations, most                 recent 11/10/2018. Arrythmias:Atrial Fibrillation; Risk                 Factors:Hypertension, Dyslipidemia and Diabetes. ESRD                 Respiratory failure with hypoxia.  Sonographer:    Vikki Ports  [place] Referring Phys: [place]  1. Left ventricular ejection fraction, by estimation, is 65 to 70%. The left ventricle has normal function. The left ventricle has no regional wall motion abnormalities. Left ventricular diastolic parameters are consistent with Grade II diastolic dysfunction (pseudonormalization).  2. Right ventricular systolic function is normal. The right ventricular size is normal. There is mildly elevated pulmonary artery systolic pressure. The estimated right ventricular systolic pressure is 03.5 mmHg.  3. Left atrial size was upper normal.  4. The mitral valve is degenerative. Mild mitral valve regurgitation.  5. The aortic valve is tricuspid. Aortic valve regurgitation is not visualized.  6. The inferior vena cava is normal in size with greater than 50% respiratory variability, suggesting right atrial pressure of 3 mmHg. FINDINGS  Left Ventricle: Left ventricular ejection fraction, by estimation, is 65 to 70%. The left ventricle has normal function. The left ventricle has no  regional wall motion abnormalities. The left ventricular internal cavity size was normal in size. There is  no left ventricular hypertrophy. Left ventricular diastolic parameters are consistent with Grade II diastolic dysfunction (pseudonormalization). Right Ventricle: The right ventricular size is normal. No increase in right ventricular wall thickness. Right ventricular systolic function is normal. There is mildly elevated pulmonary artery systolic pressure. The tricuspid regurgitant velocity is 2.88  m/s, and with an assumed right atrial pressure of 3 mmHg, the estimated right ventricular systolic pressure is 00.9 mmHg. Left Atrium: Left atrial size was upper normal. Right Atrium: Right atrial size was normal in size. Pericardium: There is no evidence of pericardial effusion. Mitral Valve: The mitral valve is degenerative in appearance. There is mild calcification of the mitral valve leaflet(s). Mild mitral annular calcification. Mild mitral valve regurgitation. Tricuspid Valve: The tricuspid valve is grossly normal. Tricuspid valve regurgitation is mild. Aortic Valve: The aortic valve is tricuspid. Aortic valve regurgitation is not visualized. Mild aortic valve annular calcification. Aortic valve mean gradient measures 7.0 mmHg. Aortic valve peak gradient measures 12.7 mmHg. Aortic valve area, by VTI measures 2.05 cm. Pulmonic Valve: The pulmonic valve was grossly normal. Pulmonic valve regurgitation is not visualized. Aorta: The aortic root is normal in size and structure. Venous: The inferior vena cava is normal in size with greater than 50% respiratory variability, suggesting right atrial pressure of 3 mmHg. IAS/Shunts: No atrial level shunt detected by color flow Doppler.  LEFT VENTRICLE PLAX 2D LVIDd:         4.98 cm  Diastology LVIDs:         3.44 cm  LV e' lateral:   14.00 cm/s LV PW:         0.74 cm  LV E/e' lateral: 9.3 LV IVS:        0.83 cm  LV e' medial:    4.57 cm/s LVOT diam:     1.80 cm  LV E/e'  medial:  28.4 LV SV:         88 LV SV Index:   49 LVOT Area:     2.54 cm  RIGHT VENTRICLE RV S prime:     8.70 cm/s TAPSE (M-mode): 1.7 cm LEFT ATRIUM             Index       RIGHT ATRIUM           Index LA diam:        4.10 cm 2.28 cm/m  RA Area:     19.40 cm LA Vol (A2C):   42.7 ml  23.72 ml/m RA Volume:   56.80 ml  31.55 ml/m LA Vol (A4C):   58.8 ml 32.66 ml/m LA Biplane Vol: 53.3 ml 29.61 ml/m  AORTIC VALVE AV Area (Vmax):    1.72 cm AV Area (Vmean):   1.89 cm AV Area (VTI):     2.05 cm AV Vmax:           178.00 cm/s AV Vmean:          122.000 cm/s AV VTI:            0.431 m AV Peak Grad:      12.7 mmHg AV Mean Grad:      7.0 mmHg LVOT Vmax:         120.00 cm/s LVOT Vmean:        90.400 cm/s LVOT VTI:          0.347 m LVOT/AV VTI ratio: 0.81  AORTA Ao Root diam: 2.60 cm MITRAL VALVE                TRICUSPID VALVE MV Area (PHT): 6.54 cm     TR Peak grad:   33.2 mmHg MV Decel Time: 116 msec     TR Vmax:        288.00 cm/s MV E velocity: 130.00 cm/s MV A velocity: 111.00 cm/s  SHUNTS MV E/A ratio:  1.17         Systemic VTI:  0.35 m                             Systemic Diam: 1.80 cm Rozann Lesches MD Electronically signed by Rozann Lesches MD Signature Date/Time: 08/04/2019/10:07:37 AM    Final     Labs:  CBC: Recent Labs    08/01/19 1249 08/03/19 1120 08/04/19 0443 08/05/19 1445  WBC 13.9* 8.2 7.9 6.9  HGB 9.4* 9.9* 8.6* 9.0*  HCT 32.3* 34.8* 28.3* 29.2*  PLT 224 253 233 306    COAGS: Recent Labs    11/19/18 0903 11/20/18 0558 11/20/18 1329 11/30/18 0244 08/03/19 1651 08/04/19 0443 08/05/19 0504 08/06/19 0445  INR   < >  --  1.4*   < > 4.3* 3.6* 2.7* 2.5*  APTT  --  28 34  --   --   --   --   --    < > = values in this interval not displayed.    BMP: Recent Labs    08/01/19 1249 08/03/19 1120 08/04/19 0443 08/05/19 1445  NA 137 135 133* 131*  K 4.1 5.7* 3.8 4.5  CL 98 96* 91* 90*  CO2 26 19* 27 26  GLUCOSE 151* 187* 88 133*  BUN 16 40* 15 36*  CALCIUM 8.6*  8.1* 7.9* 8.2*  CREATININE 3.93* 7.13* 3.25* 5.69*  GFRNONAA 10* 5* 13* 7*  GFRAA 12* 6* 15* 8*    LIVER FUNCTION TESTS: Recent Labs    05/21/19 0712 05/21/19 0712 05/22/19 0735 05/23/19 0750 06/24/19 2259 08/05/19 1445  BILITOT 0.5  --  0.3 0.5 0.8  --   AST 29  --  22 15 33  --   ALT 28  --  25 20 12   --   ALKPHOS 64  --  63 59 82  --   PROT 6.2*  --  5.9* 5.7* 6.1*  --   ALBUMIN 2.8*   < > 2.8* 2.7* 2.5* 2.8*   < > = values in this interval not displayed.  TUMOR MARKERS: No results for input(s): AFPTM, CEA, CA199, CHROMGRNA in the last 8760 hours.  Assessment and Plan:  Mid back pain Pain meds without relief  AMS - possibly secondary meds-- now resolved CT revealing T6-T7 compression fractures MRI pending per Dr Estanislado Pandy We will await result of MR Re evaluate and plan for possible Kyphoplasty if deemed candidate INR 2.5 today-- will need to be wnl for procedure Also will need Insurance pre certification - to be done by schedulers (can take 1-3 days)  We will keep in contact with Dr Tat and determine candidacy and date and time of procedure accordingly.   Thank you for this interesting consult.  I greatly enjoyed meeting [name] and look forward to participating in their care.  A copy of this report was sent to the requesting provider on this date.  Electronically Signed: Lavonia Drafts, PA-C 08/06/2019, 11:25 AM   I spent a total of 40 Minutes    in face to face in clinical consultation, greater than 50% of which was counseling/coordinating care for T6-T7 KP

## 2019-08-06 NOTE — Progress Notes (Signed)
ANTICOAGULATION CONSULT NOTE -   Pharmacy Consult for Warfarin Indication: atrial fibrillation  Patient Measurements: Height: 5\' 4"  (162.6 cm) Weight: 153 lb (69.4 kg) IBW/kg (Calculated) : 54.7  Vital Signs: Temp: 98.1 F (36.7 C) (03/11 0500) Temp Source: Oral (03/11 0500) BP: 131/43 (03/11 0500) Pulse Rate: 71 (03/11 0500)  Labs: Recent Labs     0000 08/03/19 1120 08/03/19 1322 08/03/19 1651 08/04/19 0443 08/05/19 0504 08/05/19 1445 08/06/19 0445  HGB   < > 9.9*  --   --  8.6*  --  9.0*  --   HCT  --  34.8*  --   --  28.3*  --  29.2*  --   PLT  --  253  --   --  233  --  306  --   LABPROT  --   --   --    < > 35.7* 29.0*  --  26.9*  INR  --   --   --    < > 3.6* 2.7*  --  2.5*  CREATININE  --  7.13*  --   --  3.25*  --  5.69*  --   TROPONINIHS  --  21* 23*  --   --   --   --   --    < > = values in this interval not displayed.    Estimated Creatinine Clearance: 7.7 mL/min (A) (by C-G formula based on SCr of 5.69 mg/dL (H)).   Medications:  PTA Warfarin  Assessment:  79 yr female presents with AMS   PMH significant for DM, AFib, HTN, dHF, ESRD, HLD and hypothyroidism.  Recent fall sustaining T6-T7 compression fracture (08/01/19).  Pharmacy consulted to continue dosing warfarin  Patient takes 5 mg on Tue/Sat and 7.5 mg ROW per anti coag clinic.   INR = 2.5  Amiodarone re-started today-->PTA med  Goal of Therapy:  INR 2-3  Monitor platelets    Plan:  Give warfarin 7.5mg  x 1 dose today Monitor daily INR and s/s of bleeding.  Margot Ables, PharmD Clinical Pharmacist 08/06/2019 8:22 AM

## 2019-08-06 NOTE — Progress Notes (Cosign Needed)
Patient ID: [name], female   DOB: 12/11/1940, 79 y.o.   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 Oblique fracture through the T7 vertebral body. Minimal loss of height of T6 and T7, no more than 10-15%. No retropulsed bone. No evidence of extension of the fracture through the fused posterior Elements.  Request for consideration of Kyphoplasty  Will review with IR Rad today Will let MD know if candidate asap  INR 2.5 today Coumadin stopped today per MD

## 2019-08-06 NOTE — Progress Notes (Signed)
ANTICOAGULATION CONSULT NOTE - Follow Up Consult  Pharmacy Consult for heparin infusion Indication: atrial fibrillation bridging  Allergies  Allergen Reactions  . Insulin Glargine Swelling    "Makes me swell like a balloon all over", including face, but without any respiratory distress or rashes. Associated with weight gain.  . Statins Other (See Comments)    "I've tried them all; my muscle aches were so bad I couldn't walk".    Patient Measurements: Height: 5\' 4"  (162.6 cm) Weight: 153 lb (69.4 kg) IBW/kg (Calculated) : 54.7 Heparin Dosing Weight:   Vital Signs: Temp: 98.5 F (36.9 C) (03/11 2039) Temp Source: Oral (03/11 2039) BP: 136/51 (03/11 2039) Pulse Rate: 67 (03/11 2039)  Labs: Recent Labs    08/04/19 0443 08/05/19 0504 08/05/19 1445 08/06/19 0445  HGB 8.6*  --  9.0*  --   HCT 28.3*  --  29.2*  --   PLT 233  --  306  --   LABPROT 35.7* 29.0*  --  26.9*  INR 3.6* 2.7*  --  2.5*  CREATININE 3.25*  --  5.69*  --     Estimated Creatinine Clearance: 7.7 mL/min (A) (by C-G formula based on SCr of 5.69 mg/dL (H)).   Medications:  Scheduled:  . amiodarone  200 mg Oral Daily  . aspirin EC  81 mg Oral Daily  . calcium acetate  667-2,001 mg Oral TID WC  . Chlorhexidine Gluconate Cloth  6 each Topical Q0600  . darbepoetin (ARANESP) injection - DIALYSIS  60 mcg Intravenous Q Tue-HD  . febuxostat  40 mg Oral Daily  . [START ON 08/07/2019] feeding supplement (NEPRO CARB STEADY)  237 mL Oral BID BM  . gabapentin  300 mg Oral BID  . insulin aspart  0-6 Units Subcutaneous TID WC  . lidocaine  1 patch Transdermal Q24H  . mouth rinse  15 mL Mouth Rinse BID  . multivitamin with minerals  1 tablet Oral Daily  . pantoprazole  40 mg Oral Daily  . polyethylene glycol  17 g Oral Daily  . senna-docusate  1 tablet Oral QHS  . sodium chloride flush  3 mL Intravenous Q12H  . thyroid  120 mg Oral Daily  . cholecalciferol  5,000 Units Oral Daily  . Warfarin - Pharmacist Dosing  Inpatient   Does not apply q1800    Assessment: Patient currently on warfarin for atrial fibrillation and Kyphoplasty is being considered for 08/10/19  Today's INR >2 Last dose of warfarin was 7.5mg  administered on 08/06/19 1725  Goal of Therapy:  Heparin level 0.3-0.7 units/ml Monitor platelets by anticoagulation protocol: Yes   Plan:  Hold warfarin Begin heparin infusion for bridging anticoagulation when INR < 2 in preparation for procedure on 08/10/19 Monitor daily PT/INR, CBC as ordered by provider, s/s bleeding  Efraim Kaufmann, PharmD, BCPS 08/06/2019,8:50 PM

## 2019-08-07 LAB — CBC
HCT: 36 % (ref 36.0–46.0)
Hemoglobin: 10.5 g/dL — ABNORMAL LOW (ref 12.0–15.0)
MCH: 29.9 pg (ref 26.0–34.0)
MCHC: 29.2 g/dL — ABNORMAL LOW (ref 30.0–36.0)
MCV: 102.6 fL — ABNORMAL HIGH (ref 80.0–100.0)
Platelets: 361 10*3/uL (ref 150–400)
RBC: 3.51 MIL/uL — ABNORMAL LOW (ref 3.87–5.11)
RDW: 17.8 % — ABNORMAL HIGH (ref 11.5–15.5)
WBC: 11.2 10*3/uL — ABNORMAL HIGH (ref 4.0–10.5)
nRBC: 0 % (ref 0.0–0.2)

## 2019-08-07 LAB — RENAL FUNCTION PANEL
Albumin: 2.7 g/dL — ABNORMAL LOW (ref 3.5–5.0)
Anion gap: 14 (ref 5–15)
BUN: 46 mg/dL — ABNORMAL HIGH (ref 8–23)
CO2: 27 mmol/L (ref 22–32)
Calcium: 8.5 mg/dL — ABNORMAL LOW (ref 8.9–10.3)
Chloride: 91 mmol/L — ABNORMAL LOW (ref 98–111)
Creatinine, Ser: 6.06 mg/dL — ABNORMAL HIGH (ref 0.44–1.00)
GFR calc Af Amer: 7 mL/min — ABNORMAL LOW (ref >60)
GFR calc non Af Amer: 6 mL/min — ABNORMAL LOW (ref >60)
Glucose, Bld: 268 mg/dL — ABNORMAL HIGH (ref 70–99)
Phosphorus: 3.7 mg/dL (ref 2.5–4.6)
Potassium: 4.4 mmol/L (ref 3.5–5.1)
Sodium: 132 mmol/L — ABNORMAL LOW (ref 135–145)

## 2019-08-07 LAB — GLUCOSE, CAPILLARY
Glucose-Capillary: 126 mg/dL — ABNORMAL HIGH (ref 70–99)
Glucose-Capillary: 182 mg/dL — ABNORMAL HIGH (ref 70–99)
Glucose-Capillary: 172 mg/dL — ABNORMAL HIGH (ref 70–99)
Glucose-Capillary: 133 mg/dL — ABNORMAL HIGH (ref 70–99)

## 2019-08-07 LAB — APTT: aPTT: 51 seconds — ABNORMAL HIGH (ref 24–36)

## 2019-08-07 LAB — PROTIME-INR
INR: 2.7 — ABNORMAL HIGH (ref 0.8–1.2)
Prothrombin Time: 28.4 seconds — ABNORMAL HIGH (ref 11.4–15.2)

## 2019-08-07 MED ORDER — RENA-VITE PO TABS
1.0000 | ORAL_TABLET | Freq: Every day | ORAL | Status: DC
  Administered 2019-08-07 – 2019-08-13 (×7): 1 via ORAL
  Filled 2019-08-07 (×8): qty 1

## 2019-08-07 MED ORDER — PRO-STAT SUGAR FREE PO LIQD
30.0000 mL | Freq: Two times a day (BID) | ORAL | Status: DC
  Administered 2019-08-07 – 2019-08-12 (×10): 30 mL via ORAL
  Filled 2019-08-07 (×12): qty 30

## 2019-08-07 MED ORDER — GABAPENTIN 300 MG PO CAPS
300.0000 mg | ORAL_CAPSULE | Freq: Every day | ORAL | Status: DC
  Administered 2019-08-08: 22:00:00 300 mg via ORAL
  Filled 2019-08-07: qty 1

## 2019-08-07 NOTE — Progress Notes (Addendum)
ANTICOAGULATION CONSULT NOTE - Follow Up Consult  Pharmacy Consult for heparin infusion Indication: atrial fibrillation bridging  Allergies  Allergen Reactions  . Insulin Glargine Swelling    "Makes me swell like a balloon all over", including face, but without any respiratory distress or rashes. Associated with weight gain.  . Statins Other (See Comments)    "I've tried them all; my muscle aches were so bad I couldn't walk".    Patient Measurements: Height: 5\' 4"  (162.6 cm) Weight: 158 lb 8.2 oz (71.9 kg) IBW/kg (Calculated) : 54.7 Heparin Dosing Weight:   Vital Signs: Temp: 98.7 F (37.1 C) (03/12 0938) Temp Source: Oral (03/12 0938) BP: 135/45 (03/12 0938) Pulse Rate: 79 (03/12 0938)  Labs: Recent Labs    08/05/19 0504 08/05/19 1445 08/06/19 0445 08/07/19 0539  HGB  --  9.0*  --  10.5*  HCT  --  29.2*  --  36.0  PLT  --  306  --  361  APTT  --   --   --  51*  LABPROT 29.0*  --  26.9* 28.4*  INR 2.7*  --  2.5* 2.7*  CREATININE  --  5.69*  --   --     Estimated Creatinine Clearance: 7.8 mL/min (A) (by C-G formula based on SCr of 5.69 mg/dL (H)).  Assessment:   INR: 2.7-->hold for  Kyphoplasty   being considered for 08/10/19   Warfarin   7.5mg  given on  on 08/06/19    CBC: low stable    Platelets 361K  Goal of Therapy:  Heparin level 0.3-0.7 units/ml Monitor platelets by anticoagulation protocol: Yes   Plan:    Start heparin infusion for bridging when INR < 2 in preparation for procedure on 08/10/19 Monitor daily PT/INR, CBC qMon-Wed-Fri Monitor for s/s bleeding  Despina Pole, PharmD  08/07/2019,12:23 PM

## 2019-08-07 NOTE — Procedures (Signed)
     HEMODIALYSIS TREATMENT NOTE:   3.5 hour heparin-free HD completed via right upper arm AVF (15g/antegrade). Goal NOT met: Unable to tolerate removal of 2.3 liters as ordered.  Soft pressures / Net UF 1 liter.  All blood was returned and hemostasis was achieved in 15 minutes.  Rockwell Alexandria, RN

## 2019-08-07 NOTE — Progress Notes (Signed)
Initial Nutrition Assessment  DOCUMENTATION CODES:   Not applicable  INTERVENTION:  Continue Nepro po BID, each supplement provides 425 kcal and 19 grams protein  Provide Magic cup TID with meals, each supplement provides 290 kcal and 9 grams of protein  Provide Pro-stat po BID, each supplement provides 100 kcal and 15 grams of protein  Provide Rena-vit daily  Recommend regular diet   NUTRITION DIAGNOSIS:   Increased nutrient needs related to chronic illness, wound healing(ESRD on HD; stage II pressure injury to buttocks; diabetic foot ulcer) as evidenced by estimated needs.   GOAL:   Patient will meet greater than or equal to 90% of their needs   MONITOR:   Labs, I & O's, Skin, Supplement acceptance, Weight trends, PO intake  REASON FOR ASSESSMENT:   Consult Assessment of nutrition requirement/status  ASSESSMENT:  RD working remotely.  79 year old female with past medical history significant of DM2 with neuropathy, atrial fibrillation, HTN, chronic diastolic CHF, GERD, mixed HLD, hypothyroidism, ESRD on HD, recent ED visit after mechanical fall with findings of T6-T7 compression fracture on 08/01/19 presented via EMS secondary to episode of unresponsiveness and AMS. Patient with partial resolution of symptoms after Narcan given by EMS, but found with hyperkalemia, new O2 supplementation requirements, and significantly bradycardic. In ED, CXR demonstrated mild vascular congestion; cardiology and nephrology consulted and patient admitted on 3/9 for further evaluation.  HD RUE AVF: MWF - [name]  3/10 Net UF 2059ml  Per notes: -HD planned for today -confusion/AMS/encephalopathy improved; likely narcotic related -ongoing pain d/t compression fx, MRI T spine with <50% loss of height; possible kypohoplasty -outpt cardiology follow-up -volume status managed by HD -d/c to SNF when able to sit upright for transport and HD  Patient with poor oral intake, per flowsheet  she is eating 0-10% of meals at this time. Patient is provided Nepro supplement twice daily per medication review. RD attempted to contact pt via phone this morning, however pt did not answer. Given her poor intake of meals, stage II pressure to buttocks, and ESRD patient is at increased risk for malnutrition. RD will order Pro-stat twice daily and provide magic cup with meals to aid with estimated needs. Patient currently on [place] which is restrictive of protein, recommend liberalizing diet to regular.   Non-pitting BLE edema noted per 3/11 RN assessment.  Current wt 158.18 lbs Per history, on 1/27 pt wt 164.56 lbs, 2/04 pt wt 164.56 lbs, on 3/06 pt wt 159.72 lbs. Unsure of EDW, suspect ~165 lbs per chart review.   Medications reviewed and include: Phoslo, Uloric, Gabapentin, SSI, MVI, Protonix, Miralax, Senokot, Armour, Vit D3  Labs: CBGs 126, 109, 85, 100 x 24 hrs  NUTRITION - FOCUSED PHYSICAL EXAM: Unable to complete at this time, RD working remotely.  Diet Order:   Diet Order            Diet Heart Room service appropriate? Yes; Fluid consistency: Thin  Diet effective now              EDUCATION NEEDS:   Not appropriate for education at this time  Skin:  Skin Assessment: Skin Integrity Issues: Skin Integrity Issues:: Stage II, Diabetic Ulcer Stage II: buttocks Diabetic Ulcer: R foot  Last BM:  3/12 type 6  Height:   Ht Readings from Last 1 Encounters:  08/05/19 5\' 4"  (1.626 m)    Weight:   Wt Readings from Last 1 Encounters:  08/07/19 71.9 kg    Ideal Body Weight:  54.5 kg  BMI:  Body mass index is 27.21 kg/m.  Estimated Nutritional Needs:   Kcal:  2100-2300  Protein:  105-115  Fluid:  per MD   Lajuan Lines, RD, LDN Clinical Nutrition After Hours/Weekend Pager # in [place]

## 2019-08-07 NOTE — Care Management Important Message (Signed)
Important Message  Patient Details  Name: [name] MRN: [id number] Date of Birth: 1940-12-22   Medicare Important Message Given:  Yes     Tommy Medal 08/07/2019, 1:54 PM

## 2019-08-07 NOTE — TOC Progression Note (Signed)
Transition of Care [facility]) - Progression Note    Patient Details  Name: [name] MRN: [id number] Date of Birth: May 31, 1940  [facility] of [facility]) CM/SW Contact  Boneta Lucks, RN Phone Number: 08/07/2019, 2:12 PM  Clinical Narrative:   Patient continues to have pain and unable to sit up.  IR has reviewed and scheduled for kypoplasty on Tuesday.  [facility] had made a bed offer, but will not take until patient can sit up to be transported to HD. [facility] does not manage her [employer]. TOC to follow.    Expected Discharge Plan: Skilled Nursing Facility Barriers to Discharge: Continued Medical Work up  Expected Discharge Plan and Services Expected Discharge Plan: [place] arrangements for the past 2 months: Single Family Home                    Readmission Risk Interventions Readmission Risk Prevention Plan 08/04/2019 11/27/2018 11/11/2018  Transportation Screening Complete Complete Complete  Social Work Consult for [place] Planning/Counseling - - Complete  Palliative Care Screening - - Not Applicable  Medication Review Press photographer) Complete Complete -  PCP or Specialist appointment within 3-5 days of discharge Not Complete - -  PCP/Specialist Appt Not Complete comments Patient will discharge to SNF - -  [place] or [place] Not Complete Complete -  [place] or Home Care Consult Pt Refusal Comments Patient will discharge to SNF - -  [facility] Recovery Care/Counseling Consult Complete Complete -  Palliative Care Screening Not Complete Not Applicable -  Comments Patient discharging to SNF - -  [place] Complete Not Applicable -  Some recent data might be hidden

## 2019-08-07 NOTE — Progress Notes (Signed)
PROGRESS NOTE  [name] GMW:[id number] DOB: 1940/06/15 DOA: 08/03/2019 PCP: Doree Albee, MD  Brief History: 79 year old female with a history of paroxysmal atrial fibrillation on warfarin, hypertension, diastolic CHF, ESRD(MWF), hyperlipidemia, hypothyroidism, coronary artery disease presenting with altered mental status. The patient was recently in the emergency department on 08/01/2019 after mechanical fall resulting in a T6-7 compression fracture. She was sent home in stable condition with hydrocodone. However, her caretaker and significant other found the patient with altered mental status and unresponsiveness. EMS was activated. The patient's mental status showed improvement after given Narcan. However, the patient was noted to have hyperkalemia with new oxygen requirement with bradycardia in the 30-40 range. Chest x-ray showed mild vascular congestion. The patient was given calcium gluconate and Lokelma in the emergency department with atropine. Cardiology and nephrology were consulted  Assessment/Plan: Acute toxic encephalopathy -Likely related to the patient's opioid medication--significant other was giving pt opioids q2-4 hour at home  -Recently started on hydrocodone for her thoracic compression fracture as outpt -08/07/19--more confused, likely due to pt getting fentanyl AND hydrocodone -discontinue IV fentanyl and decrease gabapentin  Thoracic spine compression fracture(T6-T7) -Unfortunately, patient is unable to sit up for transport or dialysis -CTthoracicspine--Oblique fracture through the T7 vertebral body. Minimal loss of height of T6 and T7, no more than 10-15%. No retropulsed bone. -Consult IR for possible kyphoplasty-->requesting MR thoracic spine to fully evaluate -Judicious opioid treatment--start norco 5/325 -3/11 MR T-spine--acute T7 fx with <50% loss of height; Acute to subacute T6 fx with <50% loss of height  Transient junctional  bradycardia -Appreciate cardiology consult -May have been partly attributable to her hyperkalemia in the setting of ESRD -Amiodarone restarted -observe off of metoprolol -no further bradycardia or pauses  Chronic diastolic CHF -Volume status managed by hemodialysis -08/04/2019 echo EF of 65-70%, grade 2 DD, mild elevated PASP, mild MR/TR  ESRD -Appreciate nephrology consult -Hemodialysis on 08/05/2019  Paroxysmal atrial fibrillation -Presently in sinus rhythm -Appreciate cardiology -continue warfarin -Restarted amiodarone  Hypothyroidism -Continue Armour Thyroid  Essential hypertension -Holding metoprolol succinate and amlodipine secondary to soft blood pressure initially      Disposition Plan: Patient From:Home D/C Place:SNF when able to sit upright for transport and HD Barriers:inability to sit up for transport or HD; confusion  Family Communication:Significant other updatedat bedside 08/07/19  Consultants:Renal; cardiology  Code Status: FULL   DVT Prophylaxis:warfarin   Procedures: As Listed in Progress Note Above  Antibiotics: None      Subjective: Pt is a little confused.  Denies f/c, cp, sob, n/v/d, abd pain.  Complains of back pain with sitting but not with rolling over.  She had BM yesterday  Objective: Vitals:   08/06/19 2039 08/07/19 0500 08/07/19 0547 08/07/19 0938  BP: (!) 136/51  110/73 (!) 135/45  Pulse: 67  79 79  Resp: 20     Temp: 98.5 F (36.9 C)  97.6 F (36.4 C) 98.7 F (37.1 C)  TempSrc: Oral  Oral Oral  SpO2: 99%  100% 96%  Weight:  71.9 kg    Height:        Intake/Output Summary (Last 24 hours) at 08/07/2019 1312 Last data filed at 08/07/2019 1200 Gross per 24 hour  Intake 420 ml  Output --  Net 420 ml   Weight change: -0.8 kg Exam:   General:  Pt is alert, follows commands appropriately, not in acute distress  HEENT: No icterus, No thrush, No neck mass, [place]/AT  Cardiovascular:  RRR, S1/S2, no rubs, no gallops  Respiratory: fine bibasilar rales. No wheeze  Abdomen: Soft/+BS, non tender, non distended, no guarding  Extremities: No edema, No lymphangitis, No petechiae, No rashes, no synovitis   Data Reviewed: I have personally reviewed following labs and imaging studies Basic Metabolic Panel: Recent Labs  Lab 08/01/19 1249 08/03/19 1120 08/03/19 1651 08/04/19 0443 08/05/19 1445  NA 137 135  --  133* 131*  K 4.1 5.7*  --  3.8 4.5  CL 98 96*  --  91* 90*  CO2 26 19*  --  27 26  GLUCOSE 151* 187*  --  88 133*  BUN 16 40*  --  15 36*  CREATININE 3.93* 7.13*  --  3.25* 5.69*  CALCIUM 8.6* 8.1*  --  7.9* 8.2*  MG  --   --  2.3  --   --   PHOS  --   --  7.0*  --  6.4*   Liver Function Tests: Recent Labs  Lab 08/05/19 1445  ALBUMIN 2.8*   No results for input(s): LIPASE, AMYLASE in the last 168 hours. No results for input(s): AMMONIA in the last 168 hours. Coagulation Profile: Recent Labs  Lab 08/03/19 1651 08/04/19 0443 08/05/19 0504 08/06/19 0445 08/07/19 0539  INR 4.3* 3.6* 2.7* 2.5* 2.7*   CBC: Recent Labs  Lab 08/01/19 1249 08/03/19 1120 08/04/19 0443 08/05/19 1445 08/07/19 0539  WBC 13.9* 8.2 7.9 6.9 11.2*  NEUTROABS 12.1* 7.2  --   --   --   HGB 9.4* 9.9* 8.6* 9.0* 10.5*  HCT 32.3* 34.8* 28.3* 29.2* 36.0  MCV 107.3* 108.8* 100.7* 100.7* 102.6*  PLT 224 253 233 306 361   Cardiac Enzymes: No results for input(s): CKTOTAL, CKMB, CKMBINDEX, TROPONINI in the last 168 hours. BNP: Invalid input(s): POCBNP CBG: Recent Labs  Lab 08/06/19 1243 08/06/19 1711 08/06/19 2056 08/07/19 0742 08/07/19 1211  GLUCAP 100* 85 109* 126* 182*   HbA1C: No results for input(s): HGBA1C in the last 72 hours. Urine analysis:    Component Value Date/Time   COLORURINE AMBER (A) 05/19/2019 0919   APPEARANCEUR HAZY (A) 05/19/2019 0919   LABSPEC 1.016 05/19/2019 0919   PHURINE 5.0 05/19/2019 0919   GLUCOSEU NEGATIVE 05/19/2019 0919   HGBUR  NEGATIVE 05/19/2019 0919   BILIRUBINUR NEGATIVE 05/19/2019 0919   KETONESUR NEGATIVE 05/19/2019 0919   PROTEINUR 100 (A) 05/19/2019 0919   UROBILINOGEN 0.2 02/08/2015 1615   NITRITE NEGATIVE 05/19/2019 0919   LEUKOCYTESUR NEGATIVE 05/19/2019 0919   Sepsis Labs: @LABRCNTIP (procalcitonin:4,lacticidven:4) ) Recent Results (from the past 240 hour(s))  Blood culture (routine x 2)     Status: None (Preliminary result)   Collection Time: 08/03/19 11:21 AM   Specimen: BLOOD LEFT HAND  Result Value Ref Range Status   Specimen Description BLOOD LEFT HAND  Final   Special Requests   Final    BOTTLES DRAWN AEROBIC ONLY Blood Culture results may not be optimal due to an inadequate volume of blood received in culture bottles   Culture   Final    NO GROWTH 4 DAYS Performed at [facility], [address], [place], [place] [address]    Report Status PENDING  Incomplete  Blood culture (routine x 2)     Status: None (Preliminary result)   Collection Time: 08/03/19  1:22 PM   Specimen: BLOOD RIGHT ARM  Result Value Ref Range Status   Specimen Description BLOOD RIGHT ARM  Final   Special Requests   Final  BOTTLES DRAWN AEROBIC AND ANAEROBIC Blood Culture adequate volume   Culture   Final    NO GROWTH 4 DAYS Performed at [facility], [address], [place], [place] [address]    Report Status PENDING  Incomplete  Respiratory Panel by RT PCR (Flu A&B, Covid) - Nasopharyngeal Swab     Status: None   Collection Time: 08/03/19  5:09 PM   Specimen: Nasopharyngeal Swab  Result Value Ref Range Status   SARS Coronavirus 2 by RT PCR NEGATIVE NEGATIVE Final    Comment: (NOTE) SARS-CoV-2 target nucleic acids are NOT DETECTED. The SARS-CoV-2 RNA is generally detectable in upper respiratoy specimens during the acute phase of infection. The lowest concentration of SARS-CoV-2 viral copies this assay can detect is 131 copies/mL. A negative result does not preclude SARS-Cov-2 infection and should not be  used as the sole basis for treatment or other patient management decisions. A negative result may occur with  improper specimen collection/handling, submission of specimen other than nasopharyngeal swab, presence of viral mutation(s) within the areas targeted by this assay, and inadequate number of viral copies (<131 copies/mL). A negative result must be combined with clinical observations, patient history, and epidemiological information. The expected result is Negative. Fact Sheet for Patients:  PinkCheek.be Fact Sheet for Healthcare Providers:  GravelBags.it This test is not yet ap proved or cleared by the [place] FDA and  has been authorized for detection and/or diagnosis of SARS-CoV-2 by FDA under an Emergency Use Authorization (EUA). This EUA will remain  in effect (meaning this test can be used) for the duration of the COVID-19 declaration under Section 564(b)(1) of the Act, 21 U.S.C. section 360bbb-3(b)(1), unless the authorization is terminated or revoked sooner.    Influenza A by PCR NEGATIVE NEGATIVE Final   Influenza B by PCR NEGATIVE NEGATIVE Final    Comment: (NOTE) The Xpert Xpress SARS-CoV-2/FLU/RSV assay is intended as an aid in  the diagnosis of influenza from Nasopharyngeal swab specimens and  should not be used as a sole basis for treatment. Nasal washings and  aspirates are unacceptable for Xpert Xpress SARS-CoV-2/FLU/RSV  testing. Fact Sheet for Patients: PinkCheek.be Fact Sheet for Healthcare Providers: GravelBags.it This test is not yet approved or cleared by the [place] FDA and  has been authorized for detection and/or diagnosis of SARS-CoV-2 by  FDA under an Emergency Use Authorization (EUA). This EUA will remain  in effect (meaning this test can be used) for the duration of the  Covid-19 declaration under Section 564(b)(1) of the  Act, 21  U.S.C. section 360bbb-3(b)(1), unless the authorization is  terminated or revoked. Performed at [facility], [address] [place], [place] [address]   MRSA PCR Screening     Status: None   Collection Time: 08/03/19  9:52 PM   Specimen: Nasal Mucosa; Nasopharyngeal  Result Value Ref Range Status   MRSA by PCR NEGATIVE NEGATIVE Final    Comment:        The GeneXpert MRSA Assay (FDA approved for NASAL specimens only), is one component of a comprehensive MRSA colonization surveillance program. It is not intended to diagnose MRSA infection nor to guide or monitor treatment for MRSA infections. Performed at [facility], [address], [place], [place] [address]      Scheduled Meds: . amiodarone  200 mg Oral Daily  . aspirin EC  81 mg Oral Daily  . calcium acetate  667-2,001 mg Oral TID WC  . Chlorhexidine Gluconate Cloth  6 each Topical  O1751  . darbepoetin (ARANESP) injection - DIALYSIS  60 mcg Intravenous Q Tue-HD  . febuxostat  40 mg Oral Daily  . feeding supplement (NEPRO CARB STEADY)  237 mL Oral BID BM  . feeding supplement (PRO-STAT SUGAR FREE 64)  30 mL Oral BID  . [START ON 08/08/2019] gabapentin  300 mg Oral QHS  . insulin aspart  0-6 Units Subcutaneous TID WC  . lidocaine  1 patch Transdermal Q24H  . mouth rinse  15 mL Mouth Rinse BID  . multivitamin  1 tablet Oral QHS  . multivitamin with minerals  1 tablet Oral Daily  . pantoprazole  40 mg Oral Daily  . polyethylene glycol  17 g Oral Daily  . senna-docusate  1 tablet Oral QHS  . sodium chloride flush  3 mL Intravenous Q12H  . thyroid  120 mg Oral Daily  . cholecalciferol  5,000 Units Oral Daily   Continuous Infusions: . sodium chloride    . sodium chloride    . sodium chloride      Procedures/Studies: DG Ribs Unilateral W/Chest Right  Result Date: 08/01/2019 CLINICAL DATA:  Right posterior rib tenderness following a fall this morning. EXAM: RIGHT RIBS AND CHEST - 3+ VIEW COMPARISON:  Portable  chest dated 05/19/2019 FINDINGS: Normal sized heart. Post CABG changes. Small amount of interval linear atelectasis or scarring at the lung bases. Stable left proximal humerus fixation hardware. Old, healed right rib fractures with no acute rib fractures or pneumothorax seen. Previously described T6 and T7 vertebral compression deformities, better visualized on the thoracic spine radiographs obtained at the same time. IMPRESSION: 1. No acute rib fractures seen. 2. Small amount of interval bibasilar linear atelectasis or scarring. 3. Separately described interval T6 and T7 vertebral compression deformities. Electronically Signed   By: Claudie Revering M.D.   On: 08/01/2019 13:45   DG Thoracic Spine 2 View  Result Date: 08/01/2019 CLINICAL DATA:  Midline back tenderness following a fall. Upper back pain. EXAM: THORACIC SPINE 2 VIEWS COMPARISON:  Chest radiographs dated 01/05/2019 FINDINGS: Site scratch the interval approximately 20% inferior endplate compression deformity of the T6 vertebral body and approximately 30% compression deformity of the T7 vertebral body. No significant bony retropulsion visualized. No well visualized acute fracture lines. Post CABG changes and previously demonstrated left humerus fixation hardware. Thoracolumbar spine degenerative changes. IMPRESSION: 1. Interval approximately 20% inferior endplate compression deformity of the T6 vertebral body and approximately 30% compression deformity of the T7 vertebral body. 2. Thoracolumbar spine degenerative changes. Electronically Signed   By: Claudie Revering M.D.   On: 08/01/2019 13:42   CT THORACIC SPINE WO CONTRAST  Result Date: 08/05/2019 CLINICAL DATA:  [place] on 08/01/2019.  Back pain. EXAM: CT THORACIC SPINE WITHOUT CONTRAST TECHNIQUE: Multidetector CT images of the thoracic were obtained using the standard protocol without intravenous contrast. COMPARISON:  Radiography 08/01/2019.  CT chest 11/07/2018 2 FINDINGS: Alignment: Normal Vertebrae:  The bones appear in general osteopenic. Widespread ankylosis of the spine. There do appear to be recent fractures at T6 and T7 with minimal loss of height. Fracture extends in an oblique fashion from the lower right vertebral body at T7, ascending upwards towards the left of the T7 vertebral body. The fracture involves the previously solid anterior osteophyte or syndesmophyte column. I can not show any involvement of the posterior elements, therefore it would be presumed stable. Paraspinal and other soft tissues: Bilateral pleural effusions layering dependently with dependent atelectasis, left more than right. Disc levels: No significant disc  space pathology. No bony stenosis of the canal or foramina. 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 Oblique fracture through the T7 vertebral body. Minimal loss of height of T6 and T7, no more than 10-15%. No retropulsed bone. No evidence of extension of the fracture through the fused posterior elements. Electronically Signed   By: Nelson Chimes M.D.   On: 08/05/2019 19:44   MR THORACIC SPINE WO CONTRAST  Result Date: 08/06/2019 CLINICAL DATA:  Mid back pain, CT with fracture EXAM: MRI THORACIC SPINE WITHOUT CONTRAST TECHNIQUE: Multiplanar, multisequence MR imaging of the thoracic spine was performed. No intravenous contrast was administered. COMPARISON:  Correlation made with CT thoracic spine 08/05/2019 FINDINGS: Axial images do not localize to sagittal images. Alignment:  No significant listhesis. Vertebrae: Vertebral body numbering is based on the prior CT as artifact distorts the upper cervical spine on the scout sequence. There are fractures through the T6 and T7 vertebral bodies. There is less than 50% loss of vertebral body height and no substantial osseous retropulsion. There is disruption of the likely calcified anterior longitudinal ligament as well as a bridging syndesmophyte are  osteophyte at T7-T8. Posterior longitudinal ligament appears intact. Cord:  No abnormal signal. Paraspinal and other soft tissues: Probable prevertebral edema at T6-T8. Disc levels: Mild multilevel degenerative disc disease and facet arthropathy. There is no high-grade stenosis. IMPRESSION: Acute T7 vertebral body fracture with less than 50% loss of height. There is likely small focal disruption of the anterior longitudinal ligament at this level. Acute to subacute T6 vertebral body fracture with less than 50% loss of height. No significant osseous retropulsion. Electronically Signed   By: Macy Mis M.D.   On: 08/06/2019 14:35   DG Chest Portable 1 View  Result Date: 08/03/2019 CLINICAL DATA:  Altered level of consciousness, bradycardia EXAM: PORTABLE CHEST 1 VIEW COMPARISON:  08/01/2019 FINDINGS: Cardiomegaly. Prior CABG. Elevation of the left hemidiaphragm with left base atelectasis and small left effusion. No confluent opacity on the right. Mild vascular congestion. IMPRESSION: Mild cardiomegaly, vascular congestion. Elevation of the left hemidiaphragm with left base atelectasis and small left effusion. Electronically Signed   By: Rolm Baptise M.D.   On: 08/03/2019 11:58   ECHOCARDIOGRAM COMPLETE  Result Date: 08/04/2019    ECHOCARDIOGRAM REPORT   Patient Name:   EMALEE KNIES Date of Exam: 08/04/2019 Medical Rec #:  [id number]        Height:       64.0 in Accession #:    [id number]       Weight:       164.5 lb Date of Birth:  Aug 20, 1940        BSA:          1.800 m Patient Age:    41 years         BP:           139/42 mmHg Patient Gender: F                HR:           68 bpm. Exam Location:  [facility] Procedure: 2D Echo Indications:    Atrial Fibrillation 427.31 / I48.91  History:        Patient has prior history of Echocardiogram examinations, most                 recent 11/10/2018. Arrythmias:Atrial Fibrillation; Risk  Factors:Hypertension, Dyslipidemia and Diabetes. ESRD                  Respiratory failure with hypoxia.  Sonographer:    Vikki Ports Turrentine Referring Phys: [place]  1. Left ventricular ejection fraction, by estimation, is 65 to 70%. The left ventricle has normal function. The left ventricle has no regional wall motion abnormalities. Left ventricular diastolic parameters are consistent with Grade II diastolic dysfunction (pseudonormalization).  2. Right ventricular systolic function is normal. The right ventricular size is normal. There is mildly elevated pulmonary artery systolic pressure. The estimated right ventricular systolic pressure is 09.6 mmHg.  3. Left atrial size was upper normal.  4. The mitral valve is degenerative. Mild mitral valve regurgitation.  5. The aortic valve is tricuspid. Aortic valve regurgitation is not visualized.  6. The inferior vena cava is normal in size with greater than 50% respiratory variability, suggesting right atrial pressure of 3 mmHg. FINDINGS  Left Ventricle: Left ventricular ejection fraction, by estimation, is 65 to 70%. The left ventricle has normal function. The left ventricle has no regional wall motion abnormalities. The left ventricular internal cavity size was normal in size. There is  no left ventricular hypertrophy. Left ventricular diastolic parameters are consistent with Grade II diastolic dysfunction (pseudonormalization). Right Ventricle: The right ventricular size is normal. No increase in right ventricular wall thickness. Right ventricular systolic function is normal. There is mildly elevated pulmonary artery systolic pressure. The tricuspid regurgitant velocity is 2.88  m/s, and with an assumed right atrial pressure of 3 mmHg, the estimated right ventricular systolic pressure is 04.5 mmHg. Left Atrium: Left atrial size was upper normal. Right Atrium: Right atrial size was normal in size. Pericardium: There is no evidence of pericardial effusion. Mitral Valve: The mitral valve is degenerative in  appearance. There is mild calcification of the mitral valve leaflet(s). Mild mitral annular calcification. Mild mitral valve regurgitation. Tricuspid Valve: The tricuspid valve is grossly normal. Tricuspid valve regurgitation is mild. Aortic Valve: The aortic valve is tricuspid. Aortic valve regurgitation is not visualized. Mild aortic valve annular calcification. Aortic valve mean gradient measures 7.0 mmHg. Aortic valve peak gradient measures 12.7 mmHg. Aortic valve area, by VTI measures 2.05 cm. Pulmonic Valve: The pulmonic valve was grossly normal. Pulmonic valve regurgitation is not visualized. Aorta: The aortic root is normal in size and structure. Venous: The inferior vena cava is normal in size with greater than 50% respiratory variability, suggesting right atrial pressure of 3 mmHg. IAS/Shunts: No atrial level shunt detected by color flow Doppler.  LEFT VENTRICLE PLAX 2D LVIDd:         4.98 cm  Diastology LVIDs:         3.44 cm  LV e' lateral:   14.00 cm/s LV PW:         0.74 cm  LV E/e' lateral: 9.3 LV IVS:        0.83 cm  LV e' medial:    4.57 cm/s LVOT diam:     1.80 cm  LV E/e' medial:  28.4 LV SV:         88 LV SV Index:   49 LVOT Area:     2.54 cm  RIGHT VENTRICLE RV S prime:     8.70 cm/s TAPSE (M-mode): 1.7 cm LEFT ATRIUM             Index       RIGHT ATRIUM  Index LA diam:        4.10 cm 2.28 cm/m  RA Area:     19.40 cm LA Vol (A2C):   42.7 ml 23.72 ml/m RA Volume:   56.80 ml  31.55 ml/m LA Vol (A4C):   58.8 ml 32.66 ml/m LA Biplane Vol: 53.3 ml 29.61 ml/m  AORTIC VALVE AV Area (Vmax):    1.72 cm AV Area (Vmean):   1.89 cm AV Area (VTI):     2.05 cm AV Vmax:           178.00 cm/s AV Vmean:          122.000 cm/s AV VTI:            0.431 m AV Peak Grad:      12.7 mmHg AV Mean Grad:      7.0 mmHg LVOT Vmax:         120.00 cm/s LVOT Vmean:        90.400 cm/s LVOT VTI:          0.347 m LVOT/AV VTI ratio: 0.81  AORTA Ao Root diam: 2.60 cm MITRAL VALVE                TRICUSPID VALVE MV  Area (PHT): 6.54 cm     TR Peak grad:   33.2 mmHg MV Decel Time: 116 msec     TR Vmax:        288.00 cm/s MV E velocity: 130.00 cm/s MV A velocity: 111.00 cm/s  SHUNTS MV E/A ratio:  1.17         Systemic VTI:  0.35 m                             Systemic Diam: 1.80 cm Rozann Lesches MD Electronically signed by Rozann Lesches MD Signature Date/Time: 08/04/2019/10:07:37 AM    Final     Orson Eva, DO  [facility]  If 7PM-7AM, please contact night-coverage [email or web address] Password [email or web address] 08/07/2019, 1:12 PM   LOS: 3 days

## 2019-08-07 NOTE — Progress Notes (Addendum)
[facility] Progress Note   Assessment/ Plan:   1. Hyperkalemia, mild; resolved with HD  2. ESRD, MWF, RUEAVF, [name]-- s/p HD 3/10, next planned 3/12.    3. Confusion/AMS/encephalopathy: Improved; likely narcotic related, improved with Narcan--> pt tells me her SO was giving her tramadol q 2 hr.  Has some confusion and possible gabapentin SE this AM--> will reduce to 300 mg QHS from BID (would expect this SE to be cumulative in between dialysis sessions).  Has low-dose norco ordered, started yesterday.  Also has IV fentanyl  4. Recent fall with T6 and T7 compression fractures, acutely painful--> per primary, pain management and consideration of kyphoplasty although not a lot of vertebral body loss--> MRI T spine yesterday 3/11 with in < 50% loss of height on T6 and T7.    5. AFib with Bradycardia, junctional at times--> cardiology consulted, appreciate assistance.   6. Metabolic acidosis, resolved  7. Anemia, hemoglobin 8.6--> 10.5, monitor  8. Hypoxia, chest x-ray with some vascular congestion; stable resp status post HD  9. Bone/ mineral- P 6.4, on phoslo  10. Dispo: ? SNF  Subjective:    Confused this AM, for HD today.  Having some myoclonic jerking it appears.  Trying to eat breakfast.     Objective:   BP 110/73 (BP Location: Left Arm)   Pulse 79   Temp 97.6 F (36.4 C) (Oral)   Resp 20   Ht 5\' 4"  (1.626 m)   Wt 71.9 kg   SpO2 100%   BMI 27.21 kg/m   Physical Exam: Gen: appears uncomfortable, sitting in bed CVS: RRR Resp: clear bilaterally Abd: soft, nontender Ext: no LE edema ACCESS: R AVF +T/B NEURO: confused, some myoclonic jerking, doesn't look exactly like asterixis  Labs: BMET Recent Labs  Lab 08/01/19 1249 08/03/19 1120 08/03/19 1651 08/04/19 0443 08/05/19 1445  NA 137 135  --  133* 131*  K 4.1 5.7*  --  3.8 4.5  CL 98 96*  --  91* 90*  CO2 26 19*  --  27 26  GLUCOSE 151* 187*  --  88 133*  BUN 16 40*  --  15 36*   CREATININE 3.93* 7.13*  --  3.25* 5.69*  CALCIUM 8.6* 8.1*  --  7.9* 8.2*  PHOS  --   --  7.0*  --  6.4*   CBC Recent Labs  Lab 08/01/19 1249 08/01/19 1249 08/03/19 1120 08/04/19 0443 08/05/19 1445 08/07/19 0539  WBC 13.9*   < > 8.2 7.9 6.9 11.2*  NEUTROABS 12.1*  --  7.2  --   --   --   HGB 9.4*   < > 9.9* 8.6* 9.0* 10.5*  HCT 32.3*   < > 34.8* 28.3* 29.2* 36.0  MCV 107.3*   < > 108.8* 100.7* 100.7* 102.6*  PLT 224   < > 253 233 306 361   < > = values in this interval not displayed.      Medications:    . amiodarone  200 mg Oral Daily  . aspirin EC  81 mg Oral Daily  . calcium acetate  667-2,001 mg Oral TID WC  . Chlorhexidine Gluconate Cloth  6 each Topical Q0600  . darbepoetin (ARANESP) injection - DIALYSIS  60 mcg Intravenous Q Tue-HD  . febuxostat  40 mg Oral Daily  . feeding supplement (NEPRO CARB STEADY)  237 mL Oral BID BM  . gabapentin  300 mg Oral BID  . insulin aspart  0-6 Units  Subcutaneous TID WC  . lidocaine  1 patch Transdermal Q24H  . mouth rinse  15 mL Mouth Rinse BID  . multivitamin with minerals  1 tablet Oral Daily  . pantoprazole  40 mg Oral Daily  . polyethylene glycol  17 g Oral Daily  . senna-docusate  1 tablet Oral QHS  . sodium chloride flush  3 mL Intravenous Q12H  . thyroid  120 mg Oral Daily  . cholecalciferol  5,000 Units Oral Daily     Madelon Lips MD 08/07/2019, 9:24 AM

## 2019-08-07 NOTE — Progress Notes (Signed)
Physical Therapy Treatment Patient Details Name: [name] MRN: [id number] DOB: 10-03-1940 Today's Date: 08/07/2019    History of Present Illness [name] is a 79 y.o. female with PMH significant for type 2 diabetes with nephropathy, A, fib (on coumadin), HTN, chronic diastolic HF, GERD, ESRD (M-W-F schedule), mixed HLD, hypothyroidism and recent ED visit after mechanical fall with findings of T6-T7 compression fracture (08/01/19); who was brought to ED by EMS secondary to episode of unresponsiveness and AMS. Patient with partial resolution to her presenting symptoms after Narcan given by EMS, but found with hyperkalemia, new requirement of oxygen supplementation and significantly bradycardic. Patient denies CP, fever, chills, hematochezia, melena, abd pain, blurred vision, skipping beat sensations or focal weakness.    PT Comments    Patient apprehensive to transition to seated despite extensive encouragement and offering assist. Patient able to complete supine exercises with c/o back pain but does not appear to be in any distress. Patient educated on completing exercises frequently while in hospital. She requires frequent verbal cueing and demonstration for positioning and mechanics of exercises. Patient will benefit from continued physical therapy in hospital and recommended venue below to increase strength, balance, endurance for safe ADLs and gait.    Follow Up Recommendations  SNF     Equipment Recommendations  None recommended by PT    Recommendations for Other Services       Precautions / Restrictions Precautions Precautions: Fall Restrictions Weight Bearing Restrictions: No    Mobility  Bed Mobility Overal bed mobility: Needs Assistance Bed Mobility: Rolling Rolling: Min guard;Supervision         General bed mobility comments: Pt limited by pain, able to roll Independently though apprehensive due to increase in pain.  Pt requested to stay in bed as sitting  increased pain.  Transfers                    Ambulation/Gait                 Stairs             Wheelchair Mobility    Modified Rankin (Stroke Patients Only)       Balance                                            Cognition Arousal/Alertness: Awake/alert Behavior During Therapy: WFL for tasks assessed/performed Overall Cognitive Status: Within Functional Limits for tasks assessed                                        Exercises General Exercises - Lower Extremity Ankle Circles/Pumps: AROM;Both;20 reps;Supine Quad Sets: AROM;Both;20 reps;Supine Heel Slides: AROM;Both;20 reps;Supine Straight Leg Raises: AROM;Both;20 reps;Supine Other Exercises Other Exercises: LTR 10x10", SKTC 2x 20", DKTC 2x 20"    General Comments        Pertinent Vitals/Pain Pain Assessment: Faces Faces Pain Scale: Hurts even more Pain Location: back Pain Intervention(s): Limited activity within patient's tolerance;Monitored during session;Premedicated before session    Home Living                      Prior Function            PT Goals (current goals can now be found in the care  plan section) Acute Rehab PT Goals Patient Stated Goal: return home PT Goal Formulation: With patient Time For Goal Achievement: 08/18/19 Potential to Achieve Goals: Fair Progress towards PT goals: Progressing toward goals    Frequency    Min 3X/week      PT Plan Current plan remains appropriate    Co-evaluation              AM-PAC PT "6 Clicks" Mobility   Outcome Measure  Help needed turning from your back to your side while in a flat bed without using bedrails?: A Lot Help needed moving from lying on your back to sitting on the side of a flat bed without using bedrails?: A Lot Help needed moving to and from a bed to a chair (including a wheelchair)?: A Lot Help needed standing up from a chair using your arms (e.g.,  wheelchair or bedside chair)?: A Lot Help needed to walk in hospital room?: A Lot Help needed climbing 3-5 steps with a railing? : A Lot 6 Click Score: 12    End of Session Equipment Utilized During Treatment: Oxygen Activity Tolerance: Patient limited by pain Patient left: in bed;with call bell/phone within reach;with family/visitor present;with bed alarm set Nurse Communication: Mobility status;Patient requests pain meds PT Visit Diagnosis: Unsteadiness on feet (R26.81);Other abnormalities of gait and mobility (R26.89);Muscle weakness (generalized) (M62.81)     Time: 3143-8887 PT Time Calculation (min) (ACUTE ONLY): 11 min  Charges:  $Therapeutic Exercise: 8-22 mins                     2:37 PM, 08/07/19 Mearl Latin PT, DPT Physical Therapist at [facility]

## 2019-08-08 DIAGNOSIS — S22000A Wedge compression fracture of unspecified thoracic vertebra, initial encounter for closed fracture: Secondary | ICD-10-CM

## 2019-08-08 DIAGNOSIS — S22000K Wedge compression fracture of unspecified thoracic vertebra, subsequent encounter for fracture with nonunion: Secondary | ICD-10-CM

## 2019-08-08 LAB — CULTURE, BLOOD (ROUTINE X 2)
Culture: NO GROWTH
Culture: NO GROWTH
Special Requests: ADEQUATE

## 2019-08-08 LAB — GLUCOSE, CAPILLARY
Glucose-Capillary: 135 mg/dL — ABNORMAL HIGH (ref 70–99)
Glucose-Capillary: 133 mg/dL — ABNORMAL HIGH (ref 70–99)
Glucose-Capillary: 149 mg/dL — ABNORMAL HIGH (ref 70–99)
Glucose-Capillary: 112 mg/dL — ABNORMAL HIGH (ref 70–99)

## 2019-08-08 LAB — PROTIME-INR
INR: 4.6 (ref 0.8–1.2)
Prothrombin Time: 43.3 seconds — ABNORMAL HIGH (ref 11.4–15.2)

## 2019-08-08 MED ORDER — GUAIFENESIN-DM 100-10 MG/5ML PO SYRP
5.0000 mL | ORAL_SOLUTION | ORAL | Status: DC | PRN
  Administered 2019-08-08 – 2019-08-11 (×4): 5 mL via ORAL
  Filled 2019-08-08 (×4): qty 5

## 2019-08-08 NOTE — Progress Notes (Signed)
PROGRESS NOTE  [name] TFT:[id number] DOB: 08/31/1940 DOA: 08/03/2019 PCP: Doree Albee, MD  Brief History: 79 year old female with a history of paroxysmal atrial fibrillation on warfarin, hypertension, diastolic CHF, ESRD(MWF), hyperlipidemia, hypothyroidism, coronary artery disease presenting with altered mental status. The patient was recently in the emergency department on 08/01/2019 after mechanical fall resulting in a T6-7 compression fracture. She was sent home in stable condition with hydrocodone. However, her caretaker and significant other found the patient with altered mental status and unresponsiveness. EMS was activated. The patient's mental status showed improvement after given Narcan. However, the patient was noted to have hyperkalemia with new oxygen requirement with bradycardia in the 30-40 range. Chest x-ray showed mild vascular congestion. The patient was given calcium gluconate and Lokelma in the emergency department with atropine. Cardiology and nephrology were consulted  Assessment/Plan: Acute toxic encephalopathy -Likely related to the patient's opioid medication--significant other was giving pt opioids q2-4 hour at home -Recently started on hydrocodone for her thoracic compression fracture as outpt -08/07/19--more confused, likely due to pt getting fentanyl AND hydrocodone -discontinue IV fentanyl and decrease gabapentin-->mental status improved  Thoracic spine compression fracture(T6-T7) -Unfortunately, patient is unable to sit up for transport or dialysis -CTthoracicspine--Oblique fracture through the T7 vertebral body. Minimal loss of height of T6 and T7, no more than 10-15%. No retropulsed bone. -Consult IR for possible kyphoplasty-->requesting MR thoracic spine to fully evaluate -Judicious opioid treatment--start norco 5/325 -3/11 MR T-spine--acute T7 fx with <50% loss of height; Acute to subacute T6 fx with <50% loss of  height -kyphoplasty tentatively planned for 08/11/19  Transient junctional bradycardia -Appreciate cardiology consult -May have been partly attributable to her hyperkalemia in the setting of ESRD -Amiodarone restarted -observe off of metoprolol -no further bradycardia or pauses  Chronic diastolic CHF -Volume status managed by hemodialysis -08/04/2019 echo EF of 65-70%, grade 2 DD, mild elevated PASP, mild MR/TR  ESRD -Appreciate nephrology consult -Hemodialysis on 08/05/2019  Paroxysmal atrial fibrillation -Presently in sinus rhythm -Appreciate cardiology -continue warfarin -Restarted amiodarone  Hypothyroidism -Continue Armour Thyroid  Essential hypertension -Holding metoprolol succinate and amlodipine secondary to soft blood pressure initially  supratherapeutic INR -holding warfarin -bridge with IV heparin when subtherapeutic      Disposition Plan: Patient From:Home D/C Place:SNF when able to sit upright for transport and HD Barriers:inability to sit up for transport or HD;  Family Communication:Significant other updatedat bedside 08/08/19  Consultants:Renal; cardiology  Code Status: FULL   DVT Prophylaxis:warfarin   Procedures: As Listed in Progress Note Above  Antibiotics: None    Subjective: Pt complains of back pain when she has any movement, but it has been inconsistent.  Patient denies fevers, chills, headache, chest pain, dyspnea, nausea, vomiting, diarrhea, abdominal pain, dysuria, hematuria, hematochezia, and melena.   Objective: Vitals:   08/08/19 0745 08/08/19 0815 08/08/19 1346 08/08/19 1347  BP:  (!) 149/53 (!) 147/51 (!) 147/51  Pulse:  68 68 68  Resp:  18 20 20   Temp:  97.6 F (36.4 C) 98.2 F (36.8 C) 98.2 F (36.8 C)  TempSrc:  Oral Oral Oral  SpO2: 92% 100%  97%  Weight:      Height:        Intake/Output Summary (Last 24 hours) at 08/08/2019 1736 Last data filed at 08/07/2019 1830 Gross per  24 hour  Intake --  Output 1000 ml  Net -1000 ml   Weight change: -0.1 kg Exam:   General:  Pt is alert, follows commands appropriately, not in acute distress  HEENT: No icterus, No thrush, No neck mass, [place]/AT  Cardiovascular: RRR, S1/S2, no rubs, no gallops  Respiratory: CTA bilaterally, no wheezing, no crackles, no rhonchi  Abdomen: Soft/+BS, non tender, non distended, no guarding  Extremities: No edema, No lymphangitis, No petechiae, No rashes, no synovitis   Data Reviewed: I have personally reviewed following labs and imaging studies Basic Metabolic Panel: Recent Labs  Lab 08/03/19 1120 08/03/19 1651 08/04/19 0443 08/05/19 1445 08/07/19 1534  NA 135  --  133* 131* 132*  K 5.7*  --  3.8 4.5 4.4  CL 96*  --  91* 90* 91*  CO2 19*  --  27 26 27   GLUCOSE 187*  --  88 133* 268*  BUN 40*  --  15 36* 46*  CREATININE 7.13*  --  3.25* 5.69* 6.06*  CALCIUM 8.1*  --  7.9* 8.2* 8.5*  MG  --  2.3  --   --   --   PHOS  --  7.0*  --  6.4* 3.7   Liver Function Tests: Recent Labs  Lab 08/05/19 1445 08/07/19 1534  ALBUMIN 2.8* 2.7*   No results for input(s): LIPASE, AMYLASE in the last 168 hours. No results for input(s): AMMONIA in the last 168 hours. Coagulation Profile: Recent Labs  Lab 08/04/19 0443 08/05/19 0504 08/06/19 0445 08/07/19 0539 08/08/19 0723  INR 3.6* 2.7* 2.5* 2.7* 4.6*   CBC: Recent Labs  Lab 08/03/19 1120 08/04/19 0443 08/05/19 1445 08/07/19 0539  WBC 8.2 7.9 6.9 11.2*  NEUTROABS 7.2  --   --   --   HGB 9.9* 8.6* 9.0* 10.5*  HCT 34.8* 28.3* 29.2* 36.0  MCV 108.8* 100.7* 100.7* 102.6*  PLT 253 233 306 361   Cardiac Enzymes: No results for input(s): CKTOTAL, CKMB, CKMBINDEX, TROPONINI in the last 168 hours. BNP: Invalid input(s): POCBNP CBG: Recent Labs  Lab 08/07/19 1621 08/07/19 2148 08/08/19 0754 08/08/19 1118 08/08/19 1639  GLUCAP 172* 133* 135* 133* 149*   HbA1C: No results for input(s): HGBA1C in the last 72  hours. Urine analysis:    Component Value Date/Time   COLORURINE AMBER (A) 05/19/2019 0919   APPEARANCEUR HAZY (A) 05/19/2019 0919   LABSPEC 1.016 05/19/2019 0919   PHURINE 5.0 05/19/2019 0919   GLUCOSEU NEGATIVE 05/19/2019 0919   HGBUR NEGATIVE 05/19/2019 0919   BILIRUBINUR NEGATIVE 05/19/2019 0919   KETONESUR NEGATIVE 05/19/2019 0919   PROTEINUR 100 (A) 05/19/2019 0919   UROBILINOGEN 0.2 02/08/2015 1615   NITRITE NEGATIVE 05/19/2019 0919   LEUKOCYTESUR NEGATIVE 05/19/2019 0919   Sepsis Labs: @LABRCNTIP (procalcitonin:4,lacticidven:4) ) Recent Results (from the past 240 hour(s))  Blood culture (routine x 2)     Status: None   Collection Time: 08/03/19 11:21 AM   Specimen: BLOOD LEFT HAND  Result Value Ref Range Status   Specimen Description BLOOD LEFT HAND  Final   Special Requests   Final    BOTTLES DRAWN AEROBIC ONLY Blood Culture results may not be optimal due to an inadequate volume of blood received in culture bottles   Culture   Final    NO GROWTH 5 DAYS Performed at [facility], [address], [place], [place] [address]    Report Status 08/08/2019 FINAL  Final  Blood culture (routine x 2)     Status: None   Collection Time: 08/03/19  1:22 PM   Specimen: BLOOD RIGHT ARM  Result Value Ref Range Status   Specimen  Description BLOOD RIGHT ARM  Final   Special Requests   Final    BOTTLES DRAWN AEROBIC AND ANAEROBIC Blood Culture adequate volume   Culture   Final    NO GROWTH 5 DAYS Performed at [facility], [address] [place], [place] [address]    Report Status 08/08/2019 FINAL  Final  Respiratory Panel by RT PCR (Flu A&B, Covid) - Nasopharyngeal Swab     Status: None   Collection Time: 08/03/19  5:09 PM   Specimen: Nasopharyngeal Swab  Result Value Ref Range Status   SARS Coronavirus 2 by RT PCR NEGATIVE NEGATIVE Final    Comment: (NOTE) SARS-CoV-2 target nucleic acids are NOT DETECTED. The SARS-CoV-2 RNA is generally detectable in upper  respiratoy specimens during the acute phase of infection. The lowest concentration of SARS-CoV-2 viral copies this assay can detect is 131 copies/mL. A negative result does not preclude SARS-Cov-2 infection and should not be used as the sole basis for treatment or other patient management decisions. A negative result may occur with  improper specimen collection/handling, submission of specimen other than nasopharyngeal swab, presence of viral mutation(s) within the areas targeted by this assay, and inadequate number of viral copies (<131 copies/mL). A negative result must be combined with clinical observations, patient history, and epidemiological information. The expected result is Negative. Fact Sheet for Patients:  PinkCheek.be Fact Sheet for Healthcare Providers:  GravelBags.it This test is not yet ap proved or cleared by the [place] FDA and  has been authorized for detection and/or diagnosis of SARS-CoV-2 by FDA under an Emergency Use Authorization (EUA). This EUA will remain  in effect (meaning this test can be used) for the duration of the COVID-19 declaration under Section 564(b)(1) of the Act, 21 U.S.C. section 360bbb-3(b)(1), unless the authorization is terminated or revoked sooner.    Influenza A by PCR NEGATIVE NEGATIVE Final   Influenza B by PCR NEGATIVE NEGATIVE Final    Comment: (NOTE) The Xpert Xpress SARS-CoV-2/FLU/RSV assay is intended as an aid in  the diagnosis of influenza from Nasopharyngeal swab specimens and  should not be used as a sole basis for treatment. Nasal washings and  aspirates are unacceptable for Xpert Xpress SARS-CoV-2/FLU/RSV  testing. Fact Sheet for Patients: PinkCheek.be Fact Sheet for Healthcare Providers: GravelBags.it This test is not yet approved or cleared by the [place] FDA and  has been authorized for  detection and/or diagnosis of SARS-CoV-2 by  FDA under an Emergency Use Authorization (EUA). This EUA will remain  in effect (meaning this test can be used) for the duration of the  Covid-19 declaration under Section 564(b)(1) of the Act, 21  U.S.C. section 360bbb-3(b)(1), unless the authorization is  terminated or revoked. Performed at [facility], [address] [place], [place] [address]   MRSA PCR Screening     Status: None   Collection Time: 08/03/19  9:52 PM   Specimen: Nasal Mucosa; Nasopharyngeal  Result Value Ref Range Status   MRSA by PCR NEGATIVE NEGATIVE Final    Comment:        The GeneXpert MRSA Assay (FDA approved for NASAL specimens only), is one component of a comprehensive MRSA colonization surveillance program. It is not intended to diagnose MRSA infection nor to guide or monitor treatment for MRSA infections. Performed at [facility], [address], [place],  [address]      Scheduled Meds:  amiodarone  200 mg Oral Daily   aspirin EC  81 mg Oral Daily  calcium acetate  667-2,001 mg Oral TID WC   Chlorhexidine Gluconate Cloth  6 each Topical Q0600   darbepoetin (ARANESP) injection - DIALYSIS  60 mcg Intravenous Q Tue-HD   febuxostat  40 mg Oral Daily   feeding supplement (NEPRO CARB STEADY)  237 mL Oral BID BM   feeding supplement (PRO-STAT SUGAR FREE 64)  30 mL Oral BID   gabapentin  300 mg Oral QHS   insulin aspart  0-6 Units Subcutaneous TID WC   lidocaine  1 patch Transdermal Q24H   mouth rinse  15 mL Mouth Rinse BID   multivitamin  1 tablet Oral QHS   multivitamin with minerals  1 tablet Oral Daily   pantoprazole  40 mg Oral Daily   polyethylene glycol  17 g Oral Daily   senna-docusate  1 tablet Oral QHS   sodium chloride flush  3 mL Intravenous Q12H   thyroid  120 mg Oral Daily   cholecalciferol  5,000 Units Oral Daily   Continuous Infusions:  sodium chloride     sodium chloride     sodium chloride       Procedures/Studies: DG Ribs Unilateral W/Chest Right  Result Date: 08/01/2019 CLINICAL DATA:  Right posterior rib tenderness following a fall this morning. EXAM: RIGHT RIBS AND CHEST - 3+ VIEW COMPARISON:  Portable chest dated 05/19/2019 FINDINGS: Normal sized heart. Post CABG changes. Small amount of interval linear atelectasis or scarring at the lung bases. Stable left proximal humerus fixation hardware. Old, healed right rib fractures with no acute rib fractures or pneumothorax seen. Previously described T6 and T7 vertebral compression deformities, better visualized on the thoracic spine radiographs obtained at the same time. IMPRESSION: 1. No acute rib fractures seen. 2. Small amount of interval bibasilar linear atelectasis or scarring. 3. Separately described interval T6 and T7 vertebral compression deformities. Electronically Signed   By: Claudie Revering M.D.   On: 08/01/2019 13:45   DG Thoracic Spine 2 View  Result Date: 08/01/2019 CLINICAL DATA:  Midline back tenderness following a fall. Upper back pain. EXAM: THORACIC SPINE 2 VIEWS COMPARISON:  Chest radiographs dated 01/05/2019 FINDINGS: Site scratch the interval approximately 20% inferior endplate compression deformity of the T6 vertebral body and approximately 30% compression deformity of the T7 vertebral body. No significant bony retropulsion visualized. No well visualized acute fracture lines. Post CABG changes and previously demonstrated left humerus fixation hardware. Thoracolumbar spine degenerative changes. IMPRESSION: 1. Interval approximately 20% inferior endplate compression deformity of the T6 vertebral body and approximately 30% compression deformity of the T7 vertebral body. 2. Thoracolumbar spine degenerative changes. Electronically Signed   By: Claudie Revering M.D.   On: 08/01/2019 13:42   CT THORACIC SPINE WO CONTRAST  Result Date: 08/05/2019 CLINICAL DATA:  [place] on 08/01/2019.  Back pain. EXAM: CT THORACIC SPINE WITHOUT CONTRAST  TECHNIQUE: Multidetector CT images of the thoracic were obtained using the standard protocol without intravenous contrast. COMPARISON:  Radiography 08/01/2019.  CT chest 11/07/2018 2 FINDINGS: Alignment: Normal Vertebrae: The bones appear in general osteopenic. Widespread ankylosis of the spine. There do appear to be recent fractures at T6 and T7 with minimal loss of height. Fracture extends in an oblique fashion from the lower right vertebral body at T7, ascending upwards towards the left of the T7 vertebral body. The fracture involves the previously solid anterior osteophyte or syndesmophyte column. I can not show any involvement of the posterior elements, therefore it would be presumed stable. Paraspinal and other soft tissues: Bilateral pleural effusions  layering dependently with dependent atelectasis, left more than right. Disc levels: No significant disc space pathology. No bony stenosis of the canal or foramina. 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 Oblique fracture through the T7 vertebral body. Minimal loss of height of T6 and T7, no more than 10-15%. No retropulsed bone. No evidence of extension of the fracture through the fused posterior elements. Electronically Signed   By: Nelson Chimes M.D.   On: 08/05/2019 19:44   MR THORACIC SPINE WO CONTRAST  Result Date: 08/06/2019 CLINICAL DATA:  Mid back pain, CT with fracture EXAM: MRI THORACIC SPINE WITHOUT CONTRAST TECHNIQUE: Multiplanar, multisequence MR imaging of the thoracic spine was performed. No intravenous contrast was administered. COMPARISON:  Correlation made with CT thoracic spine 08/05/2019 FINDINGS: Axial images do not localize to sagittal images. Alignment:  No significant listhesis. Vertebrae: Vertebral body numbering is based on the prior CT as artifact distorts the upper cervical spine on the scout sequence. There are fractures through the T6 and T7 vertebral  bodies. There is less than 50% loss of vertebral body height and no substantial osseous retropulsion. There is disruption of the likely calcified anterior longitudinal ligament as well as a bridging syndesmophyte are osteophyte at T7-T8. Posterior longitudinal ligament appears intact. Cord:  No abnormal signal. Paraspinal and other soft tissues: Probable prevertebral edema at T6-T8. Disc levels: Mild multilevel degenerative disc disease and facet arthropathy. There is no high-grade stenosis. IMPRESSION: Acute T7 vertebral body fracture with less than 50% loss of height. There is likely small focal disruption of the anterior longitudinal ligament at this level. Acute to subacute T6 vertebral body fracture with less than 50% loss of height. No significant osseous retropulsion. Electronically Signed   By: Macy Mis M.D.   On: 08/06/2019 14:35   DG Chest Portable 1 View  Result Date: 08/03/2019 CLINICAL DATA:  Altered level of consciousness, bradycardia EXAM: PORTABLE CHEST 1 VIEW COMPARISON:  08/01/2019 FINDINGS: Cardiomegaly. Prior CABG. Elevation of the left hemidiaphragm with left base atelectasis and small left effusion. No confluent opacity on the right. Mild vascular congestion. IMPRESSION: Mild cardiomegaly, vascular congestion. Elevation of the left hemidiaphragm with left base atelectasis and small left effusion. Electronically Signed   By: Rolm Baptise M.D.   On: 08/03/2019 11:58   ECHOCARDIOGRAM COMPLETE  Result Date: 08/04/2019    ECHOCARDIOGRAM REPORT   Patient Name:   [name] Date of Exam: 08/04/2019 Medical Rec #:  [id number]        Height:       64.0 in Accession #:    [id number]       Weight:       164.5 lb Date of Birth:  [birth year]        BSA:          1.800 m Patient Age:    71 years         BP:           139/42 mmHg Patient Gender: F                HR:           68 bpm. Exam Location:  [facility] Procedure: 2D Echo Indications:    Atrial Fibrillation 427.31 / I48.91  History:         Patient has prior history of Echocardiogram examinations, most                 recent  11/10/2018. Arrythmias:Atrial Fibrillation; Risk                 Factors:Hypertension, Dyslipidemia and Diabetes. ESRD                 Respiratory failure with hypoxia.  Sonographer:    Vikki Ports Turrentine Referring Phys: [place]  1. Left ventricular ejection fraction, by estimation, is 65 to 70%. The left ventricle has normal function. The left ventricle has no regional wall motion abnormalities. Left ventricular diastolic parameters are consistent with Grade II diastolic dysfunction (pseudonormalization).  2. Right ventricular systolic function is normal. The right ventricular size is normal. There is mildly elevated pulmonary artery systolic pressure. The estimated right ventricular systolic pressure is 64.6 mmHg.  3. Left atrial size was upper normal.  4. The mitral valve is degenerative. Mild mitral valve regurgitation.  5. The aortic valve is tricuspid. Aortic valve regurgitation is not visualized.  6. The inferior vena cava is normal in size with greater than 50% respiratory variability, suggesting right atrial pressure of 3 mmHg. FINDINGS  Left Ventricle: Left ventricular ejection fraction, by estimation, is 65 to 70%. The left ventricle has normal function. The left ventricle has no regional wall motion abnormalities. The left ventricular internal cavity size was normal in size. There is  no left ventricular hypertrophy. Left ventricular diastolic parameters are consistent with Grade II diastolic dysfunction (pseudonormalization). Right Ventricle: The right ventricular size is normal. No increase in right ventricular wall thickness. Right ventricular systolic function is normal. There is mildly elevated pulmonary artery systolic pressure. The tricuspid regurgitant velocity is 2.88  m/s, and with an assumed right atrial pressure of 3 mmHg, the estimated right ventricular systolic pressure is 80.3 mmHg.  Left Atrium: Left atrial size was upper normal. Right Atrium: Right atrial size was normal in size. Pericardium: There is no evidence of pericardial effusion. Mitral Valve: The mitral valve is degenerative in appearance. There is mild calcification of the mitral valve leaflet(s). Mild mitral annular calcification. Mild mitral valve regurgitation. Tricuspid Valve: The tricuspid valve is grossly normal. Tricuspid valve regurgitation is mild. Aortic Valve: The aortic valve is tricuspid. Aortic valve regurgitation is not visualized. Mild aortic valve annular calcification. Aortic valve mean gradient measures 7.0 mmHg. Aortic valve peak gradient measures 12.7 mmHg. Aortic valve area, by VTI measures 2.05 cm. Pulmonic Valve: The pulmonic valve was grossly normal. Pulmonic valve regurgitation is not visualized. Aorta: The aortic root is normal in size and structure. Venous: The inferior vena cava is normal in size with greater than 50% respiratory variability, suggesting right atrial pressure of 3 mmHg. IAS/Shunts: No atrial level shunt detected by color flow Doppler.  LEFT VENTRICLE PLAX 2D LVIDd:         4.98 cm  Diastology LVIDs:         3.44 cm  LV e' lateral:   14.00 cm/s LV PW:         0.74 cm  LV E/e' lateral: 9.3 LV IVS:        0.83 cm  LV e' medial:    4.57 cm/s LVOT diam:     1.80 cm  LV E/e' medial:  28.4 LV SV:         88 LV SV Index:   49 LVOT Area:     2.54 cm  RIGHT VENTRICLE RV S prime:     8.70 cm/s TAPSE (M-mode): 1.7 cm LEFT ATRIUM  Index       RIGHT ATRIUM           Index LA diam:        4.10 cm 2.28 cm/m  RA Area:     19.40 cm LA Vol (A2C):   42.7 ml 23.72 ml/m RA Volume:   56.80 ml  31.55 ml/m LA Vol (A4C):   58.8 ml 32.66 ml/m LA Biplane Vol: 53.3 ml 29.61 ml/m  AORTIC VALVE AV Area (Vmax):    1.72 cm AV Area (Vmean):   1.89 cm AV Area (VTI):     2.05 cm AV Vmax:           178.00 cm/s AV Vmean:          122.000 cm/s AV VTI:            0.431 m AV Peak Grad:      12.7 mmHg AV Mean  Grad:      7.0 mmHg LVOT Vmax:         120.00 cm/s LVOT Vmean:        90.400 cm/s LVOT VTI:          0.347 m LVOT/AV VTI ratio: 0.81  AORTA Ao Root diam: 2.60 cm MITRAL VALVE                TRICUSPID VALVE MV Area (PHT): 6.54 cm     TR Peak grad:   33.2 mmHg MV Decel Time: 116 msec     TR Vmax:        288.00 cm/s MV E velocity: 130.00 cm/s MV A velocity: 111.00 cm/s  SHUNTS MV E/A ratio:  1.17         Systemic VTI:  0.35 m                             Systemic Diam: 1.80 cm Rozann Lesches MD Electronically signed by Rozann Lesches MD Signature Date/Time: 08/04/2019/10:07:37 AM    Final     Orson Eva, DO  [facility]  If 7PM-7AM, please contact night-coverage [email or web address] Password [facility] 08/08/2019, 5:36 PM   LOS: 4 days

## 2019-08-08 NOTE — Progress Notes (Signed)
ANTICOAGULATION CONSULT NOTE - Follow Up Consult  Pharmacy Consult for heparin infusion Indication: atrial fibrillation bridging  Allergies  Allergen Reactions  . Insulin Glargine Swelling    "Makes me swell like a balloon all over", including face, but without any respiratory distress or rashes. Associated with weight gain.  . Statins Other (See Comments)    "I've tried them all; my muscle aches were so bad I couldn't walk".    Patient Measurements: Height: 5\' 4"  (162.6 cm) Weight: 161 lb 9.6 oz (73.3 kg) IBW/kg (Calculated) : 54.7 Heparin Dosing Weight:   Vital Signs: Temp: 97.6 F (36.4 C) (03/13 0815) Temp Source: Oral (03/13 0815) BP: 149/53 (03/13 0815) Pulse Rate: 68 (03/13 0815)  Labs: Recent Labs    08/05/19 1445 08/06/19 0445 08/07/19 0539 08/07/19 1534 08/08/19 0723  HGB 9.0*  --  10.5*  --   --   HCT 29.2*  --  36.0  --   --   PLT 306  --  361  --   --   APTT  --   --  51*  --   --   LABPROT  --  26.9* 28.4*  --  43.3*  INR  --  2.5* 2.7*  --  4.6*  CREATININE 5.69*  --   --  6.06*  --     Estimated Creatinine Clearance: 7.4 mL/min (A) (by C-G formula based on SCr of 6.06 mg/dL (H)).  Assessment:   INR: 4.6-->hold for  Kyphoplasty   being considered for 08/10/19   Warfarin   7.5mg  given on  on 08/06/19    hgb 10.5    Platelets 361K  Goal of Therapy:  Heparin level 0.3-0.7 units/ml Monitor platelets by anticoagulation protocol: Yes   Plan:    Start heparin infusion for bridging when INR < 2 in preparation for procedure on 08/10/19 Monitor daily PT/INR, CBC qMon-Wed-Fri Monitor for s/s bleeding  Margot Ables, PharmD Clinical Pharmacist 08/08/2019 9:45 AM

## 2019-08-08 NOTE — Progress Notes (Signed)
[facility] Progress Note   Assessment/ Plan:     1. ESRD, MWF, RUEAVF, [name]-- s/p HD 3/10,  3/12. Next planned for 3/15   2. Confusion/AMS/encephalopathy: Improved; likely narcotic related, improved with Narcan--> pt tells me her SO was giving her tramadol q 2 hr.  Has some confusion and possible gabapentin SE this AM--> reduced that to 300 mg QHS with positive impact.  Has low-dose norco ordered.  3. Recent fall with T6 and T7 compression fractures, acutely painful--> per primary, pain management and consideration of kyphoplasty - may be Tuesday although not a lot of vertebral body loss--> MRI T spine yesterday 3/11 with in < 50% loss of height on T6 and T7.    4. AFib with Bradycardia, junctional at times--> cardiology consulted, appreciate assistance. Beta blocker stopped   5. Anemia, hemoglobin 8.6--> 10.5, monitor  6. Hypoxia, chest x-ray with some vascular congestion; stable resp status post HD  7. Bone/ mineral- P 6.4, on phoslo  8. Dispo: ? SNF- but also cannot yet sit up to be transported to HD or sit up a HD  Will not see pt physically tomorrow Sunday- will revisit on Monday.  Next HD is Monday. Call with questions    Subjective:    S/p HD yest-  Removed one liter.  No c/o regarding HD yest but now is in PAIN   Objective:   BP (!) 147/51 (BP Location: Left Arm)   Pulse 68   Temp 98.2 F (36.8 C) (Oral)   Resp 20   Ht 5\' 4"  (1.626 m)   Wt 73.3 kg   SpO2 97%   BMI 27.74 kg/m   Physical Exam: Gen: appears uncomfortable, sitting in bed CVS: RRR Resp: clear bilaterally Abd: soft, nontender Ext: no LE edema ACCESS: R AVF +T/B NEURO: clearer here today   Labs: BMET Recent Labs  Lab 08/03/19 1120 08/03/19 1651 08/04/19 0443 08/05/19 1445 08/07/19 1534  NA 135  --  133* 131* 132*  K 5.7*  --  3.8 4.5 4.4  CL 96*  --  91* 90* 91*  CO2 19*  --  27 26 27   GLUCOSE 187*  --  88 133* 268*  BUN 40*  --  15 36* 46*  CREATININE 7.13*   --  3.25* 5.69* 6.06*  CALCIUM 8.1*  --  7.9* 8.2* 8.5*  PHOS  --  7.0*  --  6.4* 3.7   CBC Recent Labs  Lab 08/03/19 1120 08/04/19 0443 08/05/19 1445 08/07/19 0539  WBC 8.2 7.9 6.9 11.2*  NEUTROABS 7.2  --   --   --   HGB 9.9* 8.6* 9.0* 10.5*  HCT 34.8* 28.3* 29.2* 36.0  MCV 108.8* 100.7* 100.7* 102.6*  PLT 253 233 306 361      Medications:    . amiodarone  200 mg Oral Daily  . aspirin EC  81 mg Oral Daily  . calcium acetate  667-2,001 mg Oral TID WC  . Chlorhexidine Gluconate Cloth  6 each Topical Q0600  . darbepoetin (ARANESP) injection - DIALYSIS  60 mcg Intravenous Q Tue-HD  . febuxostat  40 mg Oral Daily  . feeding supplement (NEPRO CARB STEADY)  237 mL Oral BID BM  . feeding supplement (PRO-STAT SUGAR FREE 64)  30 mL Oral BID  . gabapentin  300 mg Oral QHS  . insulin aspart  0-6 Units Subcutaneous TID WC  . lidocaine  1 patch Transdermal Q24H  . mouth rinse  15 mL Mouth  Rinse BID  . multivitamin  1 tablet Oral QHS  . multivitamin with minerals  1 tablet Oral Daily  . pantoprazole  40 mg Oral Daily  . polyethylene glycol  17 g Oral Daily  . senna-docusate  1 tablet Oral QHS  . sodium chloride flush  3 mL Intravenous Q12H  . thyroid  120 mg Oral Daily  . cholecalciferol  5,000 Units Oral Daily    Louis Meckel  08/08/2019, 2:08 PM

## 2019-08-08 NOTE — Progress Notes (Signed)
Patient resting in bed with eyes closed. Patient appears to be resting peacefully, with heat packs in place where patient was complaining of pain. Will continue to monitor.

## 2019-08-08 NOTE — Progress Notes (Signed)
CRITICAL VALUE ALERT  Critical Value:  INR 4.6  Date & Time Notied:  08/08/2019 @ 0220  Provider Notified: Tat  Orders Received/Actions taken: none at this time

## 2019-08-08 NOTE — Progress Notes (Signed)
Patient resting in bed. Lidoderm patch placed on back where patient complains of pain and patient repositioned. Patient stated that she did not want to eat her lunch, nepro carb steady given to patient early per her request in place of meal. Patient voices no other complaints at this time. Will continue to monitor.

## 2019-08-09 DIAGNOSIS — N189 Chronic kidney disease, unspecified: Secondary | ICD-10-CM

## 2019-08-09 LAB — GLUCOSE, CAPILLARY
Glucose-Capillary: 127 mg/dL — ABNORMAL HIGH (ref 70–99)
Glucose-Capillary: 134 mg/dL — ABNORMAL HIGH (ref 70–99)
Glucose-Capillary: 161 mg/dL — ABNORMAL HIGH (ref 70–99)
Glucose-Capillary: 141 mg/dL — ABNORMAL HIGH (ref 70–99)

## 2019-08-09 LAB — CBC
HCT: 31.1 % — ABNORMAL LOW (ref 36.0–46.0)
Hemoglobin: 9.2 g/dL — ABNORMAL LOW (ref 12.0–15.0)
MCH: 29.9 pg (ref 26.0–34.0)
MCHC: 29.6 g/dL — ABNORMAL LOW (ref 30.0–36.0)
MCV: 101 fL — ABNORMAL HIGH (ref 80.0–100.0)
Platelets: 348 10*3/uL (ref 150–400)
RBC: 3.08 MIL/uL — ABNORMAL LOW (ref 3.87–5.11)
RDW: 17.8 % — ABNORMAL HIGH (ref 11.5–15.5)
WBC: 6.6 10*3/uL (ref 4.0–10.5)
nRBC: 0 % (ref 0.0–0.2)

## 2019-08-09 LAB — PROTIME-INR
INR: 3.4 — ABNORMAL HIGH (ref 0.8–1.2)
Prothrombin Time: 34.1 seconds — ABNORMAL HIGH (ref 11.4–15.2)

## 2019-08-09 MED ORDER — BISACODYL 10 MG RE SUPP
10.0000 mg | Freq: Once | RECTAL | Status: AC
  Administered 2019-08-09: 10 mg via RECTAL
  Filled 2019-08-09: qty 1

## 2019-08-09 MED ORDER — CHLORHEXIDINE GLUCONATE CLOTH 2 % EX PADS
6.0000 | MEDICATED_PAD | Freq: Every day | CUTANEOUS | Status: DC
  Administered 2019-08-10 – 2019-08-13 (×3): 6 via TOPICAL

## 2019-08-09 NOTE — Progress Notes (Signed)
PROGRESS NOTE  [name] WEX:[id number] DOB: 06-07-1940 DOA: 08/03/2019 PCP: Doree Albee, MD  Brief History: 79 year old female with a history of paroxysmal atrial fibrillation on warfarin, hypertension, diastolic CHF, ESRD(MWF), hyperlipidemia, hypothyroidism, coronary artery disease presenting with altered mental status. The patient was recently in the emergency department on 08/01/2019 after mechanical fall resulting in a T6-7 compression fracture. She was sent home in stable condition with hydrocodone. However, her caretaker and significant other found the patient with altered mental status and unresponsiveness. EMS was activated. The patient's mental status showed improvement after given Narcan. However, the patient was noted to have hyperkalemia with new oxygen requirement with bradycardia in the 30-40 range. Chest x-ray showed mild vascular congestion. The patient was given calcium gluconate and Lokelma in the emergency department with atropine. Cardiology and nephrology were consulted  Assessment/Plan: Acute toxic encephalopathy -Likely related to the patient's opioid medication--significant other was giving pt opioids q2-4 hour at home -Recently started on hydrocodone for her thoracic compression fractureas outpt -08/07/19--more confused, likely due to pt getting fentanyl AND hydrocodone -discontinue IV fentanyl and decrease gabapentin-->mental status improved -d/c gaba as significant other complains of confusion at bedtime  Thoracic spine compression fracture(T6-T7) -Unfortunately, patient is unable to sit up for transport or dialysis -CTthoracicspine--Oblique fracture through the T7 vertebral body. Minimal loss of height of T6 and T7, no more than 10-15%. No retropulsed bone. -Consult IR for possible kyphoplasty-->requesting MR thoracic spine to fully evaluate -Judicious opioid treatment--start norco 5/325 -3/11 MR T-spine--acute T7 fx with <50%  loss of height; Acute to subacute T6 fx with <50% loss of height -kyphoplasty tentatively planned for 08/11/19  Transient junctional bradycardia -Appreciate cardiology consult -May have been partly attributable to her hyperkalemia in the setting of ESRD -Amiodarone restarted -observe off of metoprolol -no further bradycardia or pauses  Chronic diastolic CHF -Volume status managed by hemodialysis -08/04/2019 echo EF of 65-70%, grade 2 DD, mild elevated PASP, mild MR/TR  ESRD -Appreciate nephrology consult -Hemodialysis on 08/05/2019  Paroxysmal atrial fibrillation -Presently in sinus rhythm -Appreciate cardiology -continue warfarin -Restarted amiodarone  Hypothyroidism -Continue Armour Thyroid  Essential hypertension -Holding metoprolol succinate and amlodipine secondary to soft blood pressure initially  supratherapeutic INR -holding warfarin -bridge with IV heparin when subtherapeutic      Disposition Plan: Patient From:Home D/C Place:SNF when able to sit upright for transport and HD Barriers:inability to sit up for transport or HD;  Family Communication:Significant other updatedat bedside 08/08/19  Consultants:Renal; cardiology  Code Status: FULL   DVT Prophylaxis:warfarin   Procedures: As Listed in Progress Note Above  Antibiotics: None   Subjective: Pt complains of back pain.  States she still cannot sit up.  Patient denies fevers, chills, headache, chest pain, dyspnea, nausea, vomiting, diarrhea, abdominal pain, dysuria, hematuria, hematochezia, and melena.   Objective: Vitals:   08/08/19 2100 08/08/19 2229 08/09/19 0613 08/09/19 1500  BP:  (!) 151/50 (!) 141/46 (!) 140/42  Pulse: 65 64 62 62  Resp: 20 20 18    Temp:  98.2 F (36.8 C) 97.9 F (36.6 C) 98 F (36.7 C)  TempSrc:  Oral Oral Oral  SpO2: 95% 99% (!) 89% 96%  Weight:      Height:        Intake/Output Summary (Last 24 hours) at 08/09/2019 1606 Last  data filed at 08/09/2019 9381 Gross per 24 hour  Intake 0 ml  Output 0 ml  Net 0 ml   Weight change:  Exam:   General:  Pt is alert, follows commands appropriately, not in acute distress  HEENT: No icterus, No thrush, No neck mass, [place]/AT  Cardiovascular: RRR, S1/S2, no rubs, no gallops  Respiratory: bibasilar crackle. No wheeze  Abdomen: Soft/+BS, non tender, non distended, no guarding  Extremities: No edema, No lymphangitis, No petechiae, No rashes, no synovitis   Data Reviewed: I have personally reviewed following labs and imaging studies Basic Metabolic Panel: Recent Labs  Lab 08/03/19 1120 08/03/19 1651 08/04/19 0443 08/05/19 1445 08/07/19 1534  NA 135  --  133* 131* 132*  K 5.7*  --  3.8 4.5 4.4  CL 96*  --  91* 90* 91*  CO2 19*  --  27 26 27   GLUCOSE 187*  --  88 133* 268*  BUN 40*  --  15 36* 46*  CREATININE 7.13*  --  3.25* 5.69* 6.06*  CALCIUM 8.1*  --  7.9* 8.2* 8.5*  MG  --  2.3  --   --   --   PHOS  --  7.0*  --  6.4* 3.7   Liver Function Tests: Recent Labs  Lab 08/05/19 1445 08/07/19 1534  ALBUMIN 2.8* 2.7*   No results for input(s): LIPASE, AMYLASE in the last 168 hours. No results for input(s): AMMONIA in the last 168 hours. Coagulation Profile: Recent Labs  Lab 08/05/19 0504 08/06/19 0445 08/07/19 0539 08/08/19 0723 08/09/19 0727  INR 2.7* 2.5* 2.7* 4.6* 3.4*   CBC: Recent Labs  Lab 08/03/19 1120 08/04/19 0443 08/05/19 1445 08/07/19 0539 08/09/19 0727  WBC 8.2 7.9 6.9 11.2* 6.6  NEUTROABS 7.2  --   --   --   --   HGB 9.9* 8.6* 9.0* 10.5* 9.2*  HCT 34.8* 28.3* 29.2* 36.0 31.1*  MCV 108.8* 100.7* 100.7* 102.6* 101.0*  PLT 253 233 306 361 348   Cardiac Enzymes: No results for input(s): CKTOTAL, CKMB, CKMBINDEX, TROPONINI in the last 168 hours. BNP: Invalid input(s): POCBNP CBG: Recent Labs  Lab 08/08/19 1118 08/08/19 1639 08/08/19 2229 08/09/19 0724 08/09/19 1120  GLUCAP 133* 149* 112* 127* 134*   HbA1C: No  results for input(s): HGBA1C in the last 72 hours. Urine analysis:    Component Value Date/Time   COLORURINE AMBER (A) 05/19/2019 0919   APPEARANCEUR HAZY (A) 05/19/2019 0919   LABSPEC 1.016 05/19/2019 0919   PHURINE 5.0 05/19/2019 0919   GLUCOSEU NEGATIVE 05/19/2019 0919   HGBUR NEGATIVE 05/19/2019 0919   BILIRUBINUR NEGATIVE 05/19/2019 0919   KETONESUR NEGATIVE 05/19/2019 0919   PROTEINUR 100 (A) 05/19/2019 0919   UROBILINOGEN 0.2 02/08/2015 1615   NITRITE NEGATIVE 05/19/2019 0919   LEUKOCYTESUR NEGATIVE 05/19/2019 0919   Sepsis Labs: @LABRCNTIP (procalcitonin:4,lacticidven:4) ) Recent Results (from the past 240 hour(s))  Blood culture (routine x 2)     Status: None   Collection Time: 08/03/19 11:21 AM   Specimen: BLOOD LEFT HAND  Result Value [place] Status   Specimen Description BLOOD LEFT HAND  Final   Special Requests   Final    BOTTLES DRAWN AEROBIC ONLY Blood Culture results may not be optimal due to an inadequate volume of blood received in culture bottles   Culture   Final    NO GROWTH 5 DAYS Performed at [facility], [address] [place], [place] [address]    Report Status 08/08/2019 FINAL  Final  Blood culture (routine x 2)     Status: None   Collection Time: 08/03/19  1:22 PM   Specimen: BLOOD  RIGHT ARM  Result Value [place] Status   Specimen Description BLOOD RIGHT ARM  Final   Special Requests   Final    BOTTLES DRAWN AEROBIC AND ANAEROBIC Blood Culture adequate volume   Culture   Final    NO GROWTH 5 DAYS Performed at [facility], [address], [place], [place] [address]    Report Status 08/08/2019 FINAL  Final  Respiratory Panel by RT PCR (Flu A&B, Covid) - Nasopharyngeal Swab     Status: None   Collection Time: 08/03/19  5:09 PM   Specimen: Nasopharyngeal Swab  Result Value [place] Status   SARS Coronavirus 2 by RT PCR NEGATIVE NEGATIVE Final    Comment: (NOTE) SARS-CoV-2 target nucleic acids are NOT DETECTED. The SARS-CoV-2 RNA is  generally detectable in upper respiratoy specimens during the acute phase of infection. The lowest concentration of SARS-CoV-2 viral copies this assay can detect is 131 copies/mL. A negative result does not preclude SARS-Cov-2 infection and should not be used as the sole basis for treatment or other patient management decisions. A negative result may occur with  improper specimen collection/handling, submission of specimen other than nasopharyngeal swab, presence of viral mutation(s) within the areas targeted by this assay, and inadequate number of viral copies (<131 copies/mL). A negative result must be combined with clinical observations, patient history, and epidemiological information. The expected result is Negative. Fact Sheet for Patients:  PinkCheek.be Fact Sheet for Healthcare Providers:  GravelBags.it This test is not yet ap proved or cleared by the [place] FDA and  has been authorized for detection and/or diagnosis of SARS-CoV-2 by FDA under an Emergency Use Authorization (EUA). This EUA will remain  in effect (meaning this test can be used) for the duration of the COVID-19 declaration under Section 564(b)(1) of the Act, 21 U.S.C. section 360bbb-3(b)(1), unless the authorization is terminated or revoked sooner.    Influenza A by PCR NEGATIVE NEGATIVE Final   Influenza B by PCR NEGATIVE NEGATIVE Final    Comment: (NOTE) The Xpert Xpress SARS-CoV-2/FLU/RSV assay is intended as an aid in  the diagnosis of influenza from Nasopharyngeal swab specimens and  should not be used as a sole basis for treatment. Nasal washings and  aspirates are unacceptable for Xpert Xpress SARS-CoV-2/FLU/RSV  testing. Fact Sheet for Patients: PinkCheek.be Fact Sheet for Healthcare Providers: GravelBags.it This test is not yet approved or cleared by the [place] FDA and    has been authorized for detection and/or diagnosis of SARS-CoV-2 by  FDA under an Emergency Use Authorization (EUA). This EUA will remain  in effect (meaning this test can be used) for the duration of the  Covid-19 declaration under Section 564(b)(1) of the Act, 21  U.S.C. section 360bbb-3(b)(1), unless the authorization is  terminated or revoked. Performed at [facility], [address], [place], [place] [address]   MRSA PCR Screening     Status: None   Collection Time: 08/03/19  9:52 PM   Specimen: Nasal Mucosa; Nasopharyngeal  Result Value [place] Status   MRSA by PCR NEGATIVE NEGATIVE Final    Comment:        The GeneXpert MRSA Assay (FDA approved for NASAL specimens only), is one component of a comprehensive MRSA colonization surveillance program. It is not intended to diagnose MRSA infection nor to guide or monitor treatment for MRSA infections. Performed at [facility], [address] [place], [place] [address]      Scheduled Meds: . amiodarone  200 mg Oral  Daily  . aspirin EC  81 mg Oral Daily  . calcium acetate  667-2,001 mg Oral TID WC  . [START ON 08/10/2019] Chlorhexidine Gluconate Cloth  6 each Topical Q0600  . darbepoetin (ARANESP) injection - DIALYSIS  60 mcg Intravenous Q Tue-HD  . febuxostat  40 mg Oral Daily  . feeding supplement (NEPRO CARB STEADY)  237 mL Oral BID BM  . feeding supplement (PRO-STAT SUGAR FREE 64)  30 mL Oral BID  . gabapentin  300 mg Oral QHS  . insulin aspart  0-6 Units Subcutaneous TID WC  . lidocaine  1 patch Transdermal Q24H  . mouth rinse  15 mL Mouth Rinse BID  . multivitamin  1 tablet Oral QHS  . multivitamin with minerals  1 tablet Oral Daily  . pantoprazole  40 mg Oral Daily  . polyethylene glycol  17 g Oral Daily  . senna-docusate  1 tablet Oral QHS  . sodium chloride flush  3 mL Intravenous Q12H  . thyroid  120 mg Oral Daily  . cholecalciferol  5,000 Units Oral Daily   Continuous Infusions: . sodium chloride    .  sodium chloride    . sodium chloride      Procedures/Studies: DG Ribs Unilateral W/Chest Right  Result Date: 08/01/2019 CLINICAL DATA:  Right posterior rib tenderness following a fall this morning. EXAM: RIGHT RIBS AND CHEST - 3+ VIEW COMPARISON:  Portable chest dated 05/19/2019 FINDINGS: Normal sized heart. Post CABG changes. Small amount of interval linear atelectasis or scarring at the lung bases. Stable left proximal humerus fixation hardware. Old, healed right rib fractures with no acute rib fractures or pneumothorax seen. Previously described T6 and T7 vertebral compression deformities, better visualized on the thoracic spine radiographs obtained at the same time. IMPRESSION: 1. No acute rib fractures seen. 2. Small amount of interval bibasilar linear atelectasis or scarring. 3. Separately described interval T6 and T7 vertebral compression deformities. Electronically Signed   By: Claudie Revering M.D.   On: 08/01/2019 13:45   DG Thoracic Spine 2 View  Result Date: 08/01/2019 CLINICAL DATA:  Midline back tenderness following a fall. Upper back pain. EXAM: THORACIC SPINE 2 VIEWS COMPARISON:  Chest radiographs dated 01/05/2019 FINDINGS: Site scratch the interval approximately 20% inferior endplate compression deformity of the T6 vertebral body and approximately 30% compression deformity of the T7 vertebral body. No significant bony retropulsion visualized. No well visualized acute fracture lines. Post CABG changes and previously demonstrated left humerus fixation hardware. Thoracolumbar spine degenerative changes. IMPRESSION: 1. Interval approximately 20% inferior endplate compression deformity of the T6 vertebral body and approximately 30% compression deformity of the T7 vertebral body. 2. Thoracolumbar spine degenerative changes. Electronically Signed   By: Claudie Revering M.D.   On: 08/01/2019 13:42   CT THORACIC SPINE WO CONTRAST  Result Date: 08/05/2019 CLINICAL DATA:  [place] on 08/01/2019.  Back pain.  EXAM: CT THORACIC SPINE WITHOUT CONTRAST TECHNIQUE: Multidetector CT images of the thoracic were obtained using the standard protocol without intravenous contrast. COMPARISON:  Radiography 08/01/2019.  CT chest 11/07/2018 2 FINDINGS: Alignment: Normal Vertebrae: The bones appear in general osteopenic. Widespread ankylosis of the spine. There do appear to be recent fractures at T6 and T7 with minimal loss of height. Fracture extends in an oblique fashion from the lower right vertebral body at T7, ascending upwards towards the left of the T7 vertebral body. The fracture involves the previously solid anterior osteophyte or syndesmophyte column. I can not show any involvement of the posterior  elements, therefore it would be presumed stable. Paraspinal and other soft tissues: Bilateral pleural effusions layering dependently with dependent atelectasis, left more than right. Disc levels: No significant disc space pathology. No bony stenosis of the canal or foramina. 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 Oblique fracture through the T7 vertebral body. Minimal loss of height of T6 and T7, no more than 10-15%. No retropulsed bone. No evidence of extension of the fracture through the fused posterior elements. Electronically Signed   By: Nelson Chimes M.D.   On: 08/05/2019 19:44   MR THORACIC SPINE WO CONTRAST  Result Date: 08/06/2019 CLINICAL DATA:  Mid back pain, CT with fracture EXAM: MRI THORACIC SPINE WITHOUT CONTRAST TECHNIQUE: Multiplanar, multisequence MR imaging of the thoracic spine was performed. No intravenous contrast was administered. COMPARISON:  Correlation made with CT thoracic spine 08/05/2019 FINDINGS: Axial images do not localize to sagittal images. Alignment:  No significant listhesis. Vertebrae: Vertebral body numbering is based on the prior CT as artifact distorts the upper cervical spine on the scout sequence. There are  fractures through the T6 and T7 vertebral bodies. There is less than 50% loss of vertebral body height and no substantial osseous retropulsion. There is disruption of the likely calcified anterior longitudinal ligament as well as a bridging syndesmophyte are osteophyte at T7-T8. Posterior longitudinal ligament appears intact. Cord:  No abnormal signal. Paraspinal and other soft tissues: Probable prevertebral edema at T6-T8. Disc levels: Mild multilevel degenerative disc disease and facet arthropathy. There is no high-grade stenosis. IMPRESSION: Acute T7 vertebral body fracture with less than 50% loss of height. There is likely small focal disruption of the anterior longitudinal ligament at this level. Acute to subacute T6 vertebral body fracture with less than 50% loss of height. No significant osseous retropulsion. Electronically Signed   By: Macy Mis M.D.   On: 08/06/2019 14:35   DG Chest Portable 1 View  Result Date: 08/03/2019 CLINICAL DATA:  Altered level of consciousness, bradycardia EXAM: PORTABLE CHEST 1 VIEW COMPARISON:  08/01/2019 FINDINGS: Cardiomegaly. Prior CABG. Elevation of the left hemidiaphragm with left base atelectasis and small left effusion. No confluent opacity on the right. Mild vascular congestion. IMPRESSION: Mild cardiomegaly, vascular congestion. Elevation of the left hemidiaphragm with left base atelectasis and small left effusion. Electronically Signed   By: Rolm Baptise M.D.   On: 08/03/2019 11:58   ECHOCARDIOGRAM COMPLETE  Result Date: 08/04/2019    ECHOCARDIOGRAM REPORT   Patient Name:   FELISIA BALCOM Date of Exam: 08/04/2019 Medical Rec #:  [id number]        Height:       64.0 in Accession #:    [id number]       Weight:       164.5 lb Date of Birth:  [birth year]        BSA:          1.800 m Patient Age:    39 years         BP:           139/42 mmHg Patient Gender: F                HR:           68 bpm. Exam Location:  [facility] Procedure: 2D Echo Indications:    Atrial  Fibrillation 427.31 / I48.91  History:        Patient has prior history of Echocardiogram examinations, most  recent 11/10/2018. Arrythmias:Atrial Fibrillation; Risk                 Factors:Hypertension, Dyslipidemia and Diabetes. ESRD                 Respiratory failure with hypoxia.  Sonographer:    Vikki Ports Turrentine Referring Phys: [place]  1. Left ventricular ejection fraction, by estimation, is 65 to 70%. The left ventricle has normal function. The left ventricle has no regional wall motion abnormalities. Left ventricular diastolic parameters are consistent with Grade II diastolic dysfunction (pseudonormalization).  2. Right ventricular systolic function is normal. The right ventricular size is normal. There is mildly elevated pulmonary artery systolic pressure. The estimated right ventricular systolic pressure is 43.1 mmHg.  3. Left atrial size was upper normal.  4. The mitral valve is degenerative. Mild mitral valve regurgitation.  5. The aortic valve is tricuspid. Aortic valve regurgitation is not visualized.  6. The inferior vena cava is normal in size with greater than 50% respiratory variability, suggesting right atrial pressure of 3 mmHg. FINDINGS  Left Ventricle: Left ventricular ejection fraction, by estimation, is 65 to 70%. The left ventricle has normal function. The left ventricle has no regional wall motion abnormalities. The left ventricular internal cavity size was normal in size. There is  no left ventricular hypertrophy. Left ventricular diastolic parameters are consistent with Grade II diastolic dysfunction (pseudonormalization). Right Ventricle: The right ventricular size is normal. No increase in right ventricular wall thickness. Right ventricular systolic function is normal. There is mildly elevated pulmonary artery systolic pressure. The tricuspid regurgitant velocity is 2.88  m/s, and with an assumed right atrial pressure of 3 mmHg, the estimated  right ventricular systolic pressure is 54.0 mmHg. Left Atrium: Left atrial size was upper normal. Right Atrium: Right atrial size was normal in size. Pericardium: There is no evidence of pericardial effusion. Mitral Valve: The mitral valve is degenerative in appearance. There is mild calcification of the mitral valve leaflet(s). Mild mitral annular calcification. Mild mitral valve regurgitation. Tricuspid Valve: The tricuspid valve is grossly normal. Tricuspid valve regurgitation is mild. Aortic Valve: The aortic valve is tricuspid. Aortic valve regurgitation is not visualized. Mild aortic valve annular calcification. Aortic valve mean gradient measures 7.0 mmHg. Aortic valve peak gradient measures 12.7 mmHg. Aortic valve area, by VTI measures 2.05 cm. Pulmonic Valve: The pulmonic valve was grossly normal. Pulmonic valve regurgitation is not visualized. Aorta: The aortic root is normal in size and structure. Venous: The inferior vena cava is normal in size with greater than 50% respiratory variability, suggesting right atrial pressure of 3 mmHg. IAS/Shunts: No atrial level shunt detected by color flow Doppler.  LEFT VENTRICLE PLAX 2D LVIDd:         4.98 cm  Diastology LVIDs:         3.44 cm  LV e' lateral:   14.00 cm/s LV PW:         0.74 cm  LV E/e' lateral: 9.3 LV IVS:        0.83 cm  LV e' medial:    4.57 cm/s LVOT diam:     1.80 cm  LV E/e' medial:  28.4 LV SV:         88 LV SV Index:   49 LVOT Area:     2.54 cm  RIGHT VENTRICLE RV S prime:     8.70 cm/s TAPSE (M-mode): 1.7 cm LEFT ATRIUM  Index       RIGHT ATRIUM           Index LA diam:        4.10 cm 2.28 cm/m  RA Area:     19.40 cm LA Vol (A2C):   42.7 ml 23.72 ml/m RA Volume:   56.80 ml  31.55 ml/m LA Vol (A4C):   58.8 ml 32.66 ml/m LA Biplane Vol: 53.3 ml 29.61 ml/m  AORTIC VALVE AV Area (Vmax):    1.72 cm AV Area (Vmean):   1.89 cm AV Area (VTI):     2.05 cm AV Vmax:           178.00 cm/s AV Vmean:          122.000 cm/s AV VTI:             0.431 m AV Peak Grad:      12.7 mmHg AV Mean Grad:      7.0 mmHg LVOT Vmax:         120.00 cm/s LVOT Vmean:        90.400 cm/s LVOT VTI:          0.347 m LVOT/AV VTI ratio: 0.81  AORTA Ao Root diam: 2.60 cm MITRAL VALVE                TRICUSPID VALVE MV Area (PHT): 6.54 cm     TR Peak grad:   33.2 mmHg MV Decel Time: 116 msec     TR Vmax:        288.00 cm/s MV E velocity: 130.00 cm/s MV A velocity: 111.00 cm/s  SHUNTS MV E/A ratio:  1.17         Systemic VTI:  0.35 m                             Systemic Diam: 1.80 cm Rozann Lesches MD Electronically signed by Rozann Lesches MD Signature Date/Time: 08/04/2019/10:07:37 AM    Final     Orson Eva, DO  [facility]  If 7PM-7AM, please contact night-coverage [email or web address] Password [email or web address] 08/09/2019, 4:06 PM   LOS: 5 days

## 2019-08-09 NOTE — Progress Notes (Signed)
ANTICOAGULATION CONSULT NOTE - Follow Up Consult  Pharmacy Consult for heparin infusion Indication: atrial fibrillation bridging  Allergies  Allergen Reactions  . Insulin Glargine Swelling    "Makes me swell like a balloon all over", including face, but without any respiratory distress or rashes. Associated with weight gain.  . Statins Other (See Comments)    "I've tried them all; my muscle aches were so bad I couldn't walk".    Patient Measurements: Height: 5\' 4"  (162.6 cm) Weight: 161 lb 9.6 oz (73.3 kg) IBW/kg (Calculated) : 54.7 Heparin Dosing Weight:   Vital Signs: Temp: 97.9 F (36.6 C) (03/14 0613) Temp Source: Oral (03/14 0613) BP: 141/46 (03/14 6010) Pulse Rate: 62 (03/14 0613)  Labs: Recent Labs    08/07/19 0539 08/07/19 1534 08/08/19 0723 08/09/19 0727  HGB 10.5*  --   --  9.2*  HCT 36.0  --   --  31.1*  PLT 361  --   --  348  APTT 51*  --   --   --   LABPROT 28.4*  --  43.3* 34.1*  INR 2.7*  --  4.6* 3.4*  CREATININE  --  6.06*  --   --     Estimated Creatinine Clearance: 7.4 mL/min (A) (by C-G formula based on SCr of 6.06 mg/dL (H)).  Assessment:   INR: down to 3.4 today-->hold for  Kyphoplasty   being considered for 08/10/19   Warfarin   7.5mg  given on  on 08/06/19    hgb 10.5>9.2    Platelets 348K  Goal of Therapy:  Heparin level 0.3-0.7 units/ml Monitor platelets by anticoagulation protocol: Yes   Plan:  Start heparin infusion for bridging when INR < 2 in preparation for procedure on 08/10/19 Monitor daily PT/INR, CBC qMon-Wed-Fri Monitor for s/s bleeding  Margot Ables, PharmD Clinical Pharmacist 08/09/2019 9:39 AM

## 2019-08-10 LAB — GLUCOSE, CAPILLARY
Glucose-Capillary: 148 mg/dL — ABNORMAL HIGH (ref 70–99)
Glucose-Capillary: 125 mg/dL — ABNORMAL HIGH (ref 70–99)
Glucose-Capillary: 134 mg/dL — ABNORMAL HIGH (ref 70–99)
Glucose-Capillary: 105 mg/dL — ABNORMAL HIGH (ref 70–99)

## 2019-08-10 LAB — CBC
HCT: 28.7 % — ABNORMAL LOW (ref 36.0–46.0)
HCT: 28.6 % — ABNORMAL LOW (ref 36.0–46.0)
Hemoglobin: 8.7 g/dL — ABNORMAL LOW (ref 12.0–15.0)
Hemoglobin: 8.5 g/dL — ABNORMAL LOW (ref 12.0–15.0)
MCH: 30.5 pg (ref 26.0–34.0)
MCH: 30.1 pg (ref 26.0–34.0)
MCHC: 30.3 g/dL (ref 30.0–36.0)
MCHC: 29.7 g/dL — ABNORMAL LOW (ref 30.0–36.0)
MCV: 100.7 fL — ABNORMAL HIGH (ref 80.0–100.0)
MCV: 101.4 fL — ABNORMAL HIGH (ref 80.0–100.0)
Platelets: 360 10*3/uL (ref 150–400)
Platelets: 376 10*3/uL (ref 150–400)
RBC: 2.85 MIL/uL — ABNORMAL LOW (ref 3.87–5.11)
RBC: 2.82 MIL/uL — ABNORMAL LOW (ref 3.87–5.11)
RDW: 17.7 % — ABNORMAL HIGH (ref 11.5–15.5)
RDW: 17.9 % — ABNORMAL HIGH (ref 11.5–15.5)
WBC: 7.3 10*3/uL (ref 4.0–10.5)
WBC: 8.9 10*3/uL (ref 4.0–10.5)
nRBC: 0 % (ref 0.0–0.2)
nRBC: 0 % (ref 0.0–0.2)

## 2019-08-10 LAB — RENAL FUNCTION PANEL
Albumin: 2.7 g/dL — ABNORMAL LOW (ref 3.5–5.0)
Albumin: 2.8 g/dL — ABNORMAL LOW (ref 3.5–5.0)
Anion gap: 12 (ref 5–15)
Anion gap: 11 (ref 5–15)
BUN: 77 mg/dL — ABNORMAL HIGH (ref 8–23)
BUN: 27 mg/dL — ABNORMAL HIGH (ref 8–23)
CO2: 30 mmol/L (ref 22–32)
CO2: 28 mmol/L (ref 22–32)
Calcium: 9.3 mg/dL (ref 8.9–10.3)
Calcium: 8.3 mg/dL — ABNORMAL LOW (ref 8.9–10.3)
Chloride: 90 mmol/L — ABNORMAL LOW (ref 98–111)
Chloride: 91 mmol/L — ABNORMAL LOW (ref 98–111)
Creatinine, Ser: 6.35 mg/dL — ABNORMAL HIGH (ref 0.44–1.00)
Creatinine, Ser: 2.88 mg/dL — ABNORMAL HIGH (ref 0.44–1.00)
GFR calc Af Amer: 7 mL/min — ABNORMAL LOW (ref >60)
GFR calc Af Amer: 17 mL/min — ABNORMAL LOW (ref >60)
GFR calc non Af Amer: 6 mL/min — ABNORMAL LOW (ref >60)
GFR calc non Af Amer: 15 mL/min — ABNORMAL LOW (ref >60)
Glucose, Bld: 129 mg/dL — ABNORMAL HIGH (ref 70–99)
Glucose, Bld: 149 mg/dL — ABNORMAL HIGH (ref 70–99)
Phosphorus: 2.3 mg/dL — ABNORMAL LOW (ref 2.5–4.6)
Phosphorus: 1.2 mg/dL — ABNORMAL LOW (ref 2.5–4.6)
Potassium: 4.4 mmol/L (ref 3.5–5.1)
Potassium: 3.5 mmol/L (ref 3.5–5.1)
Sodium: 132 mmol/L — ABNORMAL LOW (ref 135–145)
Sodium: 130 mmol/L — ABNORMAL LOW (ref 135–145)

## 2019-08-10 LAB — PROTIME-INR
INR: 3.6 — ABNORMAL HIGH (ref 0.8–1.2)
Prothrombin Time: 35.7 seconds — ABNORMAL HIGH (ref 11.4–15.2)

## 2019-08-10 MED ORDER — PHYTONADIONE 5 MG PO TABS
2.5000 mg | ORAL_TABLET | Freq: Once | ORAL | Status: AC
  Administered 2019-08-10: 2.5 mg via ORAL
  Filled 2019-08-10: qty 1

## 2019-08-10 NOTE — TOC Progression Note (Signed)
Transition of Care [facility]) - Progression Note    Patient Details  Name: KYRRA PRADA MRN: [id number] Date of Birth: 19-Jun-1940  Transition of Care [facility]) CM/SW Contact  Boneta Lucks, RN Phone Number: 08/10/2019, 11:24 AM  Clinical Narrative:   Larey Brick with DC plan. Per Mardene Celeste, patient will need new PT Eval and Notes to support she is sitting up in a chair for dialysis before they can start [place] and still offer patient a bed. MD updated. TOC to follow.   Expected Discharge Plan: Skilled Nursing Facility Barriers to Discharge: Continued Medical Work up  Expected Discharge Plan and Services Expected Discharge Plan: [place] arrangements for the past 2 months: Single Family Home                   Readmission Risk Interventions Readmission Risk Prevention Plan 08/04/2019 11/27/2018 11/11/2018  Transportation Screening Complete Complete Complete  Social Work Consult for [place] Planning/Counseling - - Complete  Palliative Care Screening - - Not Applicable  Medication Review Press photographer) Complete Complete -  PCP or Specialist appointment within 3-5 days of discharge Not Complete - -  PCP/Specialist Appt Not Complete comments Patient will discharge to SNF - -  [place] or [place] Not Complete Complete -  [place] or Home Care Consult Pt Refusal Comments Patient will discharge to SNF - -  SW Recovery Care/Counseling Consult Complete Complete -  Palliative Care Screening Not Complete Not Applicable -  Comments Patient discharging to SNF - -  [place] Complete Not Applicable -  Some recent data might be hidden

## 2019-08-10 NOTE — Care Management Important Message (Signed)
Important Message  Patient Details  Name: [name] MRN: [id number] Date of Birth: 1940/07/06   Medicare Important Message Given:  Yes     Tommy Medal 08/10/2019, 3:24 PM

## 2019-08-10 NOTE — Progress Notes (Signed)
Patient ID: BETZAIRA MENTEL, female   DOB: [birth year], 79 y.o.   MRN: [id number]  [facility] Progress Note    Subjective:   Feels well, no new complaints   Objective:   BP (!) 151/52 (BP Location: Left Arm)   Pulse 69   Temp 98.3 F (36.8 C) (Oral)   Resp 16   Ht 5\' 4"  (1.626 m)   Wt 73.4 kg   SpO2 99%   BMI 27.78 kg/m   Intake/Output: I/O last 3 completed shifts: In: 38 [P.O.:810] Out: 0    Intake/Output this shift:  No intake/output data recorded. Weight change:   Physical Exam: Gen:NAD CVS: no rub Resp: cta Abd: +BS, soft, NT/ND Ext: trace pretibial edema, RUE AVF +T/B  Labs: BMET Recent Labs  Lab 08/03/19 1120 08/03/19 1651 08/04/19 0443 08/05/19 1445 08/07/19 1534  NA 135  --  133* 131* 132*  K 5.7*  --  3.8 4.5 4.4  CL 96*  --  91* 90* 91*  CO2 19*  --  27 26 27   GLUCOSE 187*  --  88 133* 268*  BUN 40*  --  15 36* 46*  CREATININE 7.13*  --  3.25* 5.69* 6.06*  ALBUMIN  --   --   --  2.8* 2.7*  CALCIUM 8.1*  --  7.9* 8.2* 8.5*  PHOS  --  7.0*  --  6.4* 3.7   CBC Recent Labs  Lab 08/03/19 1120 08/04/19 0443 08/05/19 1445 08/07/19 0539 08/09/19 0727 08/10/19 0509  WBC 8.2   < > 6.9 11.2* 6.6 7.3  NEUTROABS 7.2  --   --   --   --   --   HGB 9.9*   < > 9.0* 10.5* 9.2* 8.7*  HCT 34.8*   < > 29.2* 36.0 31.1* 28.7*  MCV 108.8*   < > 100.7* 102.6* 101.0* 100.7*  PLT 253   < > 306 361 348 360   < > = values in this interval not displayed.      Medications:    . amiodarone  200 mg Oral Daily  . aspirin EC  81 mg Oral Daily  . calcium acetate  667-2,001 mg Oral TID WC  . Chlorhexidine Gluconate Cloth  6 each Topical Q0600  . darbepoetin (ARANESP) injection - DIALYSIS  60 mcg Intravenous Q Tue-HD  . febuxostat  40 mg Oral Daily  . feeding supplement (NEPRO CARB STEADY)  237 mL Oral BID BM  . feeding supplement (PRO-STAT SUGAR FREE 64)  30 mL Oral BID  . insulin aspart  0-6 Units Subcutaneous TID WC  . lidocaine  1 patch  Transdermal Q24H  . mouth rinse  15 mL Mouth Rinse BID  . multivitamin  1 tablet Oral QHS  . multivitamin with minerals  1 tablet Oral Daily  . pantoprazole  40 mg Oral Daily  . polyethylene glycol  17 g Oral Daily  . senna-docusate  1 tablet Oral QHS  . sodium chloride flush  3 mL Intravenous Q12H  . thyroid  120 mg Oral Daily  . cholecalciferol  5,000 Units Oral Daily     Assessment/ Plan:   1. AMS- presumably due to opioids, improved since admission off of fentanyl and gabapenin 2. Thoracic spine commpression fracture T6-7.  For Kyphoplasty per IR. Awaiting for INR to improve prior to procedure. 3. ESRD continue with HD on MWF schedule 4. Anemia: cont with ESA 5. CKD-MBD: stable cont with meds 6. Nutrition: renal diet 7.  Hypertension: stable 8. A fib with bradycardia- per cardiology  Donetta Potts, MD [place] Pager [phone] 08/10/2019, 9:20 AM

## 2019-08-10 NOTE — Progress Notes (Signed)
Patient ID: [name], female   DOB: Jan 15, 1941, 79 y.o.   MRN: [id number]   Planned for T6-T7 KP in IR at some point Approved with Dr Estanislado Pandy INR 3.6 today Must be wnl to safely proceed with procedure  Will discuss with Dr Tat as soon as possible

## 2019-08-10 NOTE — Progress Notes (Signed)
Patient laying in bed receiving dialysis at this time. Lidocaine patch applied to right side near ribs. Patient states that she needs miralax because she drinks it every morning. Will give to patient after dialysis is complete as patient is laying flat and unable to drink safely in currently position and receiving dialysis. Patient has home heating pad in place on right back side for pain relief. Patient voices no other complaints at this time.

## 2019-08-10 NOTE — Progress Notes (Signed)
Morning dose of miralax was given by student nurse, patient advised.

## 2019-08-10 NOTE — Progress Notes (Signed)
PROGRESS NOTE  [name]:[id number] DOB: March 26, 1941 DOA: 08/03/2019 PCP: Doree Albee, MD   Brief History: 79 year old female with a history of paroxysmal atrial fibrillation on warfarin, hypertension, diastolic CHF, ESRD(MWF), hyperlipidemia, hypothyroidism, coronary artery disease presenting with altered mental status. The patient was recently in the emergency department on 08/01/2019 after mechanical fall resulting in a T6-7 compression fracture. She was sent home in stable condition with hydrocodone. However, her caretaker and significant other found the patient with altered mental status and unresponsiveness. EMS was activated. The patient's mental status showed improvement after given Narcan. However, the patient was noted to have hyperkalemia with new oxygen requirement with bradycardia in the 30-40 range. Chest x-ray showed mild vascular congestion. The patient was given calcium gluconate and Lokelma in the emergency department with atropine. Cardiology and nephrology were consulted  Assessment/Plan: Acute toxic encephalopathy -Likely related to the patient's opioid medication--significant other was giving pt opioids q2-4 hour at home -Recently started on hydrocodone for her thoracic compression fractureas outpt -08/07/19--more confused, likely due to pt getting fentanyl AND hydrocodone -discontinue IV fentanyl and decrease gabapentin-->mental status improved -d/c gaba as significant other complains of confusion at bedtime-->improved  Thoracic spine compression fracture(T6-T7) -Unfortunately, patient is unable to sit up for transport or dialysis -CTthoracicspine--Oblique fracture through the T7 vertebral body. Minimal loss of height of T6 and T7, no more than 10-15%. No retropulsed bone. -Consult IR for possible kyphoplasty-->requesting MR thoracic spine to fully evaluate -Judicious opioid treatment--start norco 5/325 -3/11 MR T-spine--acute T7  fx with <50% loss of height; Acute to subacute T6 fx with <50% loss of height -kyphoplasty tentatively planned for 08/11/19 or 3/17 pending INR improement  Transient junctional bradycardia -Appreciate cardiology consult -May have been partly attributable to her hyperkalemia in the setting of ESRD -Amiodarone restarted -observe off of metoprolol -no further bradycardia or pauses  Chronic diastolic CHF -Volume status managed by hemodialysis -08/04/2019 echo EF of 65-70%, grade 2 DD, mild elevated PASP, mild MR/TR  ESRD -Appreciate nephrology consult -Hemodialysis on 08/07/2019  Paroxysmal atrial fibrillation -Presently in sinus rhythm -Appreciate cardiology -continue warfarin -Restarted amiodarone  Hypothyroidism -Continue Armour Thyroid  Essential hypertension -Holding metoprolol succinate and amlodipine secondary to soft blood pressure initially  supratherapeutic INR -holding warfarin -give vitamin K 2.5 mg x 1 -bridge with IV heparin when subtherapeutic      Disposition Plan: Patient From:Home D/C Place:SNF when able to sit upright for transport and HD Barriers:inability to sit up for transport or HD;  Family Communication:Significant other updatedat bedside 08/08/19  Consultants:Renal; cardiology  Code Status: FULL   DVT Prophylaxis:warfarin   Procedures: As Listed in Progress Note Above  Antibiotics: None    Subjective: Pt complains of back pain when sitting up.  Patient denies fevers, chills, headache, chest pain, dyspnea, nausea, vomiting, diarrhea, abdominal pain, dysuria, hematuria, hematochezia, and melena.   Objective: Vitals:   08/10/19 1200 08/10/19 1230 08/10/19 1300 08/10/19 1330  BP: (!) 104/55 (!) 110/50 (!) 110/52 (!) 117/58  Pulse: 69 70 70 72  Resp: 18 18 18 18   Temp:    98.2 F (36.8 C)  TempSrc:    Oral  SpO2:    98%  Weight:    73 kg  Height:        Intake/Output Summary (Last 24 hours) at  08/10/2019 1802 Last data filed at 08/10/2019 1330 Gross per 24 hour  Intake 693 ml  Output 1000 ml  Net -307  ml   Weight change:  Exam:   General:  Pt is alert, follows commands appropriately, not in acute distress  HEENT: No icterus, No thrush, No neck mass, /AT  Cardiovascular: RRR, S1/S2, no rubs, no gallops  Respiratory: bibasilar crackles. No wheeze  Abdomen: Soft/+BS, non tender, non distended, no guarding  Extremities: No edema, No lymphangitis, No petechiae, No rashes, no synovitis   Data Reviewed: I have personally reviewed following labs and imaging studies Basic Metabolic Panel: Recent Labs  Lab 08/04/19 0443 08/05/19 1445 08/07/19 1534 08/10/19 1201 08/10/19 1644  NA 133* 131* 132* 132* 130*  K 3.8 4.5 4.4 4.4 3.5  CL 91* 90* 91* 90* 91*  CO2 27 26 27 30 28   GLUCOSE 88 133* 268* 129* 149*  BUN 15 36* 46* 77* 27*  CREATININE 3.25* 5.69* 6.06* 6.35* 2.88*  CALCIUM 7.9* 8.2* 8.5* 9.3 8.3*  PHOS  --  6.4* 3.7 2.3* 1.2*   Liver Function Tests: Recent Labs  Lab 08/05/19 1445 08/07/19 1534 08/10/19 1201 08/10/19 1644  ALBUMIN 2.8* 2.7* 2.7* 2.8*   No results for input(s): LIPASE, AMYLASE in the last 168 hours. No results for input(s): AMMONIA in the last 168 hours. Coagulation Profile: Recent Labs  Lab 08/06/19 0445 08/07/19 0539 08/08/19 0723 08/09/19 0727 08/10/19 0509  INR 2.5* 2.7* 4.6* 3.4* 3.6*   CBC: Recent Labs  Lab 08/05/19 1445 08/07/19 0539 08/09/19 0727 08/10/19 0509 08/10/19 1201  WBC 6.9 11.2* 6.6 7.3 8.9  HGB 9.0* 10.5* 9.2* 8.7* 8.5*  HCT 29.2* 36.0 31.1* 28.7* 28.6*  MCV 100.7* 102.6* 101.0* 100.7* 101.4*  PLT 306 361 348 360 376   Cardiac Enzymes: No results for input(s): CKTOTAL, CKMB, CKMBINDEX, TROPONINI in the last 168 hours. BNP: Invalid input(s): POCBNP CBG: Recent Labs  Lab 08/09/19 1644 08/09/19 2112 08/10/19 0745 08/10/19 1126 08/10/19 1647  GLUCAP 161* 141* 148* 125* 134*   HbA1C: No  results for input(s): HGBA1C in the last 72 hours. Urine analysis:    Component Value Date/Time   COLORURINE AMBER (A) 05/19/2019 0919   APPEARANCEUR HAZY (A) 05/19/2019 0919   LABSPEC 1.016 05/19/2019 0919   PHURINE 5.0 05/19/2019 0919   GLUCOSEU NEGATIVE 05/19/2019 0919   HGBUR NEGATIVE 05/19/2019 0919   BILIRUBINUR NEGATIVE 05/19/2019 0919   KETONESUR NEGATIVE 05/19/2019 0919   PROTEINUR 100 (A) 05/19/2019 0919   UROBILINOGEN 0.2 02/08/2015 1615   NITRITE NEGATIVE 05/19/2019 0919   LEUKOCYTESUR NEGATIVE 05/19/2019 0919   Sepsis Labs: @LABRCNTIP (procalcitonin:4,lacticidven:4) ) Recent Results (from the past 240 hour(s))  Blood culture (routine x 2)     Status: None   Collection Time: 08/03/19 11:21 AM   Specimen: BLOOD LEFT HAND  Result Value Ref Range Status   Specimen Description BLOOD LEFT HAND  Final   Special Requests   Final    BOTTLES DRAWN AEROBIC ONLY Blood Culture results may not be optimal due to an inadequate volume of blood received in culture bottles   Culture   Final    NO GROWTH 5 DAYS Performed at [facility], [address] [place],  [address]    Report Status 08/08/2019 FINAL  Final  Blood culture (routine x 2)     Status: None   Collection Time: 08/03/19  1:22 PM   Specimen: BLOOD RIGHT ARM  Result Value Ref Range Status   Specimen Description BLOOD RIGHT ARM  Final   Special Requests   Final    BOTTLES DRAWN AEROBIC AND ANAEROBIC Blood Culture adequate volume  Culture   Final    NO GROWTH 5 DAYS Performed at [facility], [address] [place], [place] [address]    Report Status 08/08/2019 FINAL  Final  Respiratory Panel by RT PCR (Flu A&B, Covid) - Nasopharyngeal Swab     Status: None   Collection Time: 08/03/19  5:09 PM   Specimen: Nasopharyngeal Swab  Result Value Ref Range Status   SARS Coronavirus 2 by RT PCR NEGATIVE NEGATIVE Final    Comment: (NOTE) SARS-CoV-2 target nucleic acids are NOT DETECTED. The SARS-CoV-2 RNA is  generally detectable in upper respiratoy specimens during the acute phase of infection. The lowest concentration of SARS-CoV-2 viral copies this assay can detect is 131 copies/mL. A negative result does not preclude SARS-Cov-2 infection and should not be used as the sole basis for treatment or other patient management decisions. A negative result may occur with  improper specimen collection/handling, submission of specimen other than nasopharyngeal swab, presence of viral mutation(s) within the areas targeted by this assay, and inadequate number of viral copies (<131 copies/mL). A negative result must be combined with clinical observations, patient history, and epidemiological information. The expected result is Negative. Fact Sheet for Patients:  PinkCheek.be Fact Sheet for Healthcare Providers:  GravelBags.it This test is not yet ap proved or cleared by the [place] FDA and  has been authorized for detection and/or diagnosis of SARS-CoV-2 by FDA under an Emergency Use Authorization (EUA). This EUA will remain  in effect (meaning this test can be used) for the duration of the COVID-19 declaration under Section 564(b)(1) of the Act, 21 U.S.C. section 360bbb-3(b)(1), unless the authorization is terminated or revoked sooner.    Influenza A by PCR NEGATIVE NEGATIVE Final   Influenza B by PCR NEGATIVE NEGATIVE Final    Comment: (NOTE) The Xpert Xpress SARS-CoV-2/FLU/RSV assay is intended as an aid in  the diagnosis of influenza from Nasopharyngeal swab specimens and  should not be used as a sole basis for treatment. Nasal washings and  aspirates are unacceptable for Xpert Xpress SARS-CoV-2/FLU/RSV  testing. Fact Sheet for Patients: PinkCheek.be Fact Sheet for Healthcare Providers: GravelBags.it This test is not yet approved or cleared by the [place] FDA and    has been authorized for detection and/or diagnosis of SARS-CoV-2 by  FDA under an Emergency Use Authorization (EUA). This EUA will remain  in effect (meaning this test can be used) for the duration of the  Covid-19 declaration under Section 564(b)(1) of the Act, 21  U.S.C. section 360bbb-3(b)(1), unless the authorization is  terminated or revoked. Performed at [facility], [address] [place], [place] [address]   MRSA PCR Screening     Status: None   Collection Time: 08/03/19  9:52 PM   Specimen: Nasal Mucosa; Nasopharyngeal  Result Value Ref Range Status   MRSA by PCR NEGATIVE NEGATIVE Final    Comment:        The GeneXpert MRSA Assay (FDA approved for NASAL specimens only), is one component of a comprehensive MRSA colonization surveillance program. It is not intended to diagnose MRSA infection nor to guide or monitor treatment for MRSA infections. Performed at [facility], [address] [place], [place] [address]      Scheduled Meds: . amiodarone  200 mg Oral Daily  . aspirin EC  81 mg Oral Daily  . calcium acetate  667-2,001 mg Oral TID WC  . Chlorhexidine Gluconate Cloth  6 each Topical Q0600  . darbepoetin (ARANESP) injection - DIALYSIS  60 mcg Intravenous Q Tue-HD  . febuxostat  40 mg Oral Daily  . feeding supplement (NEPRO CARB STEADY)  237 mL Oral BID BM  . feeding supplement (PRO-STAT SUGAR FREE 64)  30 mL Oral BID  . insulin aspart  0-6 Units Subcutaneous TID WC  . lidocaine  1 patch Transdermal Q24H  . mouth rinse  15 mL Mouth Rinse BID  . multivitamin  1 tablet Oral QHS  . multivitamin with minerals  1 tablet Oral Daily  . pantoprazole  40 mg Oral Daily  . polyethylene glycol  17 g Oral Daily  . senna-docusate  1 tablet Oral QHS  . sodium chloride flush  3 mL Intravenous Q12H  . thyroid  120 mg Oral Daily  . cholecalciferol  5,000 Units Oral Daily   Continuous Infusions: . sodium chloride    . sodium chloride    . sodium chloride       Procedures/Studies: DG Ribs Unilateral W/Chest Right  Result Date: 08/01/2019 CLINICAL DATA:  Right posterior rib tenderness following a fall this morning. EXAM: RIGHT RIBS AND CHEST - 3+ VIEW COMPARISON:  Portable chest dated 05/19/2019 FINDINGS: Normal sized heart. Post CABG changes. Small amount of interval linear atelectasis or scarring at the lung bases. Stable left proximal humerus fixation hardware. Old, healed right rib fractures with no acute rib fractures or pneumothorax seen. Previously described T6 and T7 vertebral compression deformities, better visualized on the thoracic spine radiographs obtained at the same time. IMPRESSION: 1. No acute rib fractures seen. 2. Small amount of interval bibasilar linear atelectasis or scarring. 3. Separately described interval T6 and T7 vertebral compression deformities. Electronically Signed   By: Claudie Revering M.D.   On: 08/01/2019 13:45   DG Thoracic Spine 2 View  Result Date: 08/01/2019 CLINICAL DATA:  Midline back tenderness following a fall. Upper back pain. EXAM: THORACIC SPINE 2 VIEWS COMPARISON:  Chest radiographs dated 01/05/2019 FINDINGS: Site scratch the interval approximately 20% inferior endplate compression deformity of the T6 vertebral body and approximately 30% compression deformity of the T7 vertebral body. No significant bony retropulsion visualized. No well visualized acute fracture lines. Post CABG changes and previously demonstrated left humerus fixation hardware. Thoracolumbar spine degenerative changes. IMPRESSION: 1. Interval approximately 20% inferior endplate compression deformity of the T6 vertebral body and approximately 30% compression deformity of the T7 vertebral body. 2. Thoracolumbar spine degenerative changes. Electronically Signed   By: Claudie Revering M.D.   On: 08/01/2019 13:42   CT THORACIC SPINE WO CONTRAST  Result Date: 08/05/2019 CLINICAL DATA:  [place] on 08/01/2019.  Back pain. EXAM: CT THORACIC SPINE WITHOUT CONTRAST  TECHNIQUE: Multidetector CT images of the thoracic were obtained using the standard protocol without intravenous contrast. COMPARISON:  Radiography 08/01/2019.  CT chest 11/07/2018 2 FINDINGS: Alignment: Normal Vertebrae: The bones appear in general osteopenic. Widespread ankylosis of the spine. There do appear to be recent fractures at T6 and T7 with minimal loss of height. Fracture extends in an oblique fashion from the lower right vertebral body at T7, ascending upwards towards the left of the T7 vertebral body. The fracture involves the previously solid anterior osteophyte or syndesmophyte column. I can not show any involvement of the posterior elements, therefore it would be presumed stable. Paraspinal and other soft tissues: Bilateral pleural effusions layering dependently with dependent atelectasis, left more than right. Disc levels: No significant disc space pathology. No bony stenosis of the canal or foramina. 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 Oblique fracture through the T7 vertebral body. Minimal loss of height of T6 and T7, no more than 10-15%. No retropulsed bone. No evidence of extension of the fracture through the fused posterior elements. Electronically Signed   By: Nelson Chimes M.D.   On: 08/05/2019 19:44   MR THORACIC SPINE WO CONTRAST  Result Date: 08/06/2019 CLINICAL DATA:  Mid back pain, CT with fracture EXAM: MRI THORACIC SPINE WITHOUT CONTRAST TECHNIQUE: Multiplanar, multisequence MR imaging of the thoracic spine was performed. No intravenous contrast was administered. COMPARISON:  Correlation made with CT thoracic spine 08/05/2019 FINDINGS: Axial images do not localize to sagittal images. Alignment:  No significant listhesis. Vertebrae: Vertebral body numbering is based on the prior CT as artifact distorts the upper cervical spine on the scout sequence. There are fractures through the T6 and T7 vertebral  bodies. There is less than 50% loss of vertebral body height and no substantial osseous retropulsion. There is disruption of the likely calcified anterior longitudinal ligament as well as a bridging syndesmophyte are osteophyte at T7-T8. Posterior longitudinal ligament appears intact. Cord:  No abnormal signal. Paraspinal and other soft tissues: Probable prevertebral edema at T6-T8. Disc levels: Mild multilevel degenerative disc disease and facet arthropathy. There is no high-grade stenosis. IMPRESSION: Acute T7 vertebral body fracture with less than 50% loss of height. There is likely small focal disruption of the anterior longitudinal ligament at this level. Acute to subacute T6 vertebral body fracture with less than 50% loss of height. No significant osseous retropulsion. Electronically Signed   By: Macy Mis M.D.   On: 08/06/2019 14:35   DG Chest Portable 1 View  Result Date: 08/03/2019 CLINICAL DATA:  Altered level of consciousness, bradycardia EXAM: PORTABLE CHEST 1 VIEW COMPARISON:  08/01/2019 FINDINGS: Cardiomegaly. Prior CABG. Elevation of the left hemidiaphragm with left base atelectasis and small left effusion. No confluent opacity on the right. Mild vascular congestion. IMPRESSION: Mild cardiomegaly, vascular congestion. Elevation of the left hemidiaphragm with left base atelectasis and small left effusion. Electronically Signed   By: Rolm Baptise M.D.   On: 08/03/2019 11:58   ECHOCARDIOGRAM COMPLETE  Result Date: 08/04/2019    ECHOCARDIOGRAM REPORT   Patient Name:   [name] Date of Exam: 08/04/2019 Medical Rec #:  [id number]        Height:       64.0 in Accession #:    [id number]       Weight:       164.5 lb Date of Birth:  [birth year]        BSA:          1.800 m Patient Age:    64 years         BP:           139/42 mmHg Patient Gender: F                HR:           68 bpm. Exam Location:  [facility] Procedure: 2D Echo Indications:    Atrial Fibrillation 427.31 / I48.91  History:         Patient has prior history of Echocardiogram examinations, most                 recent 11/10/2018. Arrythmias:Atrial Fibrillation; Risk                 Factors:Hypertension, Dyslipidemia and Diabetes. ESRD  Respiratory failure with hypoxia.  Sonographer:    Vikki Ports Turrentine Referring Phys: [place]  1. Left ventricular ejection fraction, by estimation, is 65 to 70%. The left ventricle has normal function. The left ventricle has no regional wall motion abnormalities. Left ventricular diastolic parameters are consistent with Grade II diastolic dysfunction (pseudonormalization).  2. Right ventricular systolic function is normal. The right ventricular size is normal. There is mildly elevated pulmonary artery systolic pressure. The estimated right ventricular systolic pressure is 66.4 mmHg.  3. Left atrial size was upper normal.  4. The mitral valve is degenerative. Mild mitral valve regurgitation.  5. The aortic valve is tricuspid. Aortic valve regurgitation is not visualized.  6. The inferior vena cava is normal in size with greater than 50% respiratory variability, suggesting right atrial pressure of 3 mmHg. FINDINGS  Left Ventricle: Left ventricular ejection fraction, by estimation, is 65 to 70%. The left ventricle has normal function. The left ventricle has no regional wall motion abnormalities. The left ventricular internal cavity size was normal in size. There is  no left ventricular hypertrophy. Left ventricular diastolic parameters are consistent with Grade II diastolic dysfunction (pseudonormalization). Right Ventricle: The right ventricular size is normal. No increase in right ventricular wall thickness. Right ventricular systolic function is normal. There is mildly elevated pulmonary artery systolic pressure. The tricuspid regurgitant velocity is 2.88  m/s, and with an assumed right atrial pressure of 3 mmHg, the estimated right ventricular systolic pressure is 40.3 mmHg.  Left Atrium: Left atrial size was upper normal. Right Atrium: Right atrial size was normal in size. Pericardium: There is no evidence of pericardial effusion. Mitral Valve: The mitral valve is degenerative in appearance. There is mild calcification of the mitral valve leaflet(s). Mild mitral annular calcification. Mild mitral valve regurgitation. Tricuspid Valve: The tricuspid valve is grossly normal. Tricuspid valve regurgitation is mild. Aortic Valve: The aortic valve is tricuspid. Aortic valve regurgitation is not visualized. Mild aortic valve annular calcification. Aortic valve mean gradient measures 7.0 mmHg. Aortic valve peak gradient measures 12.7 mmHg. Aortic valve area, by VTI measures 2.05 cm. Pulmonic Valve: The pulmonic valve was grossly normal. Pulmonic valve regurgitation is not visualized. Aorta: The aortic root is normal in size and structure. Venous: The inferior vena cava is normal in size with greater than 50% respiratory variability, suggesting right atrial pressure of 3 mmHg. IAS/Shunts: No atrial level shunt detected by color flow Doppler.  LEFT VENTRICLE PLAX 2D LVIDd:         4.98 cm  Diastology LVIDs:         3.44 cm  LV e' lateral:   14.00 cm/s LV PW:         0.74 cm  LV E/e' lateral: 9.3 LV IVS:        0.83 cm  LV e' medial:    4.57 cm/s LVOT diam:     1.80 cm  LV E/e' medial:  28.4 LV SV:         88 LV SV Index:   49 LVOT Area:     2.54 cm  RIGHT VENTRICLE RV S prime:     8.70 cm/s TAPSE (M-mode): 1.7 cm LEFT ATRIUM             Index       RIGHT ATRIUM           Index LA diam:        4.10 cm 2.28 cm/m  RA Area:  19.40 cm LA Vol (A2C):   42.7 ml 23.72 ml/m RA Volume:   56.80 ml  31.55 ml/m LA Vol (A4C):   58.8 ml 32.66 ml/m LA Biplane Vol: 53.3 ml 29.61 ml/m  AORTIC VALVE AV Area (Vmax):    1.72 cm AV Area (Vmean):   1.89 cm AV Area (VTI):     2.05 cm AV Vmax:           178.00 cm/s AV Vmean:          122.000 cm/s AV VTI:            0.431 m AV Peak Grad:      12.7 mmHg AV Mean  Grad:      7.0 mmHg LVOT Vmax:         120.00 cm/s LVOT Vmean:        90.400 cm/s LVOT VTI:          0.347 m LVOT/AV VTI ratio: 0.81  AORTA Ao Root diam: 2.60 cm MITRAL VALVE                TRICUSPID VALVE MV Area (PHT): 6.54 cm     TR Peak grad:   33.2 mmHg MV Decel Time: 116 msec     TR Vmax:        288.00 cm/s MV E velocity: 130.00 cm/s MV A velocity: 111.00 cm/s  SHUNTS MV E/A ratio:  1.17         Systemic VTI:  0.35 m                             Systemic Diam: 1.80 cm Rozann Lesches MD Electronically signed by Rozann Lesches MD Signature Date/Time: 08/04/2019/10:07:37 AM    Final     Orson Eva, DO  [facility]  If 7PM-7AM, please contact night-coverage [email or web address] Password [email or web address] 08/10/2019, 6:02 PM   LOS: 6 days

## 2019-08-11 ENCOUNTER — Ambulatory Visit (INDEPENDENT_AMBULATORY_CARE_PROVIDER_SITE_OTHER): Payer: Medicare HMO | Admitting: Nurse Practitioner

## 2019-08-11 LAB — HEPARIN LEVEL (UNFRACTIONATED): Heparin Unfractionated: 0.39 IU/mL (ref 0.30–0.70)

## 2019-08-11 LAB — GLUCOSE, CAPILLARY
Glucose-Capillary: 123 mg/dL — ABNORMAL HIGH (ref 70–99)
Glucose-Capillary: 120 mg/dL — ABNORMAL HIGH (ref 70–99)
Glucose-Capillary: 157 mg/dL — ABNORMAL HIGH (ref 70–99)
Glucose-Capillary: 145 mg/dL — ABNORMAL HIGH (ref 70–99)

## 2019-08-11 LAB — PROTIME-INR
INR: 1.8 — ABNORMAL HIGH (ref 0.8–1.2)
Prothrombin Time: 21.2 seconds — ABNORMAL HIGH (ref 11.4–15.2)

## 2019-08-11 MED ORDER — PHYTONADIONE 5 MG PO TABS
2.5000 mg | ORAL_TABLET | Freq: Once | ORAL | Status: AC
  Administered 2019-08-11: 2.5 mg via ORAL
  Filled 2019-08-11: qty 1

## 2019-08-11 MED ORDER — SODIUM PHOSPHATES 45 MMOLE/15ML IV SOLN
15.0000 mmol | Freq: Once | INTRAVENOUS | Status: AC
  Administered 2019-08-11: 15 mmol via INTRAVENOUS
  Filled 2019-08-11: qty 5

## 2019-08-11 MED ORDER — DARBEPOETIN ALFA 100 MCG/0.5ML IJ SOSY
100.0000 ug | PREFILLED_SYRINGE | INTRAMUSCULAR | Status: DC
  Filled 2019-08-11: qty 0.5

## 2019-08-11 MED ORDER — CHLORHEXIDINE GLUCONATE CLOTH 2 % EX PADS
6.0000 | MEDICATED_PAD | Freq: Every day | CUTANEOUS | Status: DC
  Administered 2019-08-11 – 2019-08-14 (×3): 6 via TOPICAL

## 2019-08-11 MED ORDER — HEPARIN (PORCINE) 25000 UT/250ML-% IV SOLN
1000.0000 [IU]/h | INTRAVENOUS | Status: DC
  Administered 2019-08-11: 1000 [IU]/h via INTRAVENOUS
  Filled 2019-08-11: qty 250

## 2019-08-11 NOTE — Progress Notes (Signed)
ANTICOAGULATION CONSULT NOTE - Follow Up Consult  Pharmacy Consult for heparin infusion Indication: atrial fibrillation bridging  Allergies  Allergen Reactions  . Insulin Glargine Swelling    "Makes me swell like a balloon all over", including face, but without any respiratory distress or rashes. Associated with weight gain.  . Statins Other (See Comments)    "I've tried them all; my muscle aches were so bad I couldn't walk".    Patient Measurements: Height: 5\' 4"  (162.6 cm) Weight: 160 lb 15 oz (73 kg) IBW/kg (Calculated) : 54.7 HEPARIN DW (KG): 69.7  Vital Signs: Temp: 98.6 F (37 C) (03/16 0542) Temp Source: Oral (03/16 0542) BP: 158/53 (03/16 0542) Pulse Rate: 70 (03/16 0542)  Labs: Recent Labs    08/09/19 0727 08/09/19 0727 08/10/19 0509 08/10/19 1201 08/10/19 1644 08/11/19 0522  HGB 9.2*   < > 8.7* 8.5*  --   --   HCT 31.1*  --  28.7* 28.6*  --   --   PLT 348  --  360 376  --   --   LABPROT 34.1*  --  35.7*  --   --  21.2*  INR 3.4*  --  3.6*  --   --  1.8*  CREATININE  --   --   --  6.35* 2.88*  --    < > = values in this interval not displayed.    Estimated Creatinine Clearance: 15.5 mL/min (A) (by C-G formula based on SCr of 2.88 mg/dL (H)).  Assessment: Patient with afib and on chronic warfarin to have kyphoplasty. Plan is to bridge with heparin once INR < 2. INR today is 1.8 after Vit. K 2.5mg  po x 1 dose on 3/15. Will start heparin now without bolus  Last dose of  Warfarin   7.5mg  given on  on 08/06/19    hgb 10.5>9.2>> 8.7>> 8.5    Platelets 376K  Goal of Therapy:  Heparin level 0.3-0.7 units/ml Monitor platelets by anticoagulation protocol: Yes   Plan:  Start Heparin 1000 units/hr Heparin level in ~8 hours and daily while on heparin Monitor daily PT/INR daily Monitor for s/s bleeding  Isac Sarna, BS [place], BCPS Clinical Pharmacist Pager [phone] 08/11/2019 8:49 AM

## 2019-08-11 NOTE — Progress Notes (Signed)
   Plan for T6; T7 KP in IR at [facility] 3/17  We will recheck INR in am Need 1.5 or lower--- will reschedule if remains high  Orders in place for tentative KP RN aware

## 2019-08-11 NOTE — Progress Notes (Signed)
Patient ID: [name], female   DOB: 04-25-1941, 79 y.o.   MRN: [id number]   INR 1.8 today  Recheck INR in am-- hope to be 1.5 or lower Could plan for 3/17 in IR for KP  Will place all orders

## 2019-08-11 NOTE — Progress Notes (Signed)
ANTICOAGULATION CONSULT NOTE - Follow Up Consult  Pharmacy Consult for heparin infusion Indication: atrial fibrillation bridging  Allergies  Allergen Reactions  . Insulin Glargine Swelling    "Makes me swell like a balloon all over", including face, but without any respiratory distress or rashes. Associated with weight gain.  . Statins Other (See Comments)    "I've tried them all; my muscle aches were so bad I couldn't walk".    Patient Measurements: Height: 5\' 4"  (162.6 cm) Weight: 160 lb 15 oz (73 kg) IBW/kg (Calculated) : 54.7 HEPARIN DW (KG): 69.7  Vital Signs: Temp: 98.6 F (37 C) (03/16 1412) Temp Source: Oral (03/16 1412) BP: 169/53 (03/16 1412) Pulse Rate: 66 (03/16 1412)  Labs: Recent Labs    08/09/19 0727 08/09/19 0727 08/10/19 0509 08/10/19 1201 08/10/19 1644 08/11/19 0522 08/11/19 1654  HGB 9.2*   < > 8.7* 8.5*  --   --   --   HCT 31.1*  --  28.7* 28.6*  --   --   --   PLT 348  --  360 376  --   --   --   LABPROT 34.1*  --  35.7*  --   --  21.2*  --   INR 3.4*  --  3.6*  --   --  1.8*  --   HEPARINUNFRC  --   --   --   --   --   --  0.39  CREATININE  --   --   --  6.35* 2.88*  --   --    < > = values in this interval not displayed.    Estimated Creatinine Clearance: 15.5 mL/min (A) (by C-G formula based on SCr of 2.88 mg/dL (H)).  Assessment: Patient with afib and on chronic warfarin to have kyphoplasty. Plan is to bridge with heparin once INR < 2. INR today is 1.8 after Vit. K 2.5mg  po x 1 dose on 3/15. Heparin started without bolus.  Last dose of  Warfarin   7.5mg  given on  on 08/06/19     Heparin level is therapeutic at 0.39 on 1000 units/hr. No bleeding noted. Plan is for kyphoplasty at [facility] on 3/17 if INR <1.5.  Goal of Therapy:  Heparin level 0.3-0.7 units/ml Monitor platelets by anticoagulation protocol: Yes   Plan:  Continue heparin drip at 1000 units/hr Confirmatory heparin level with am labs Monitor daily HL, CBC Monitor for s/s  bleeding  Thank you for involving pharmacy in this patient's care.  Renold Genta, PharmD, BCPS Clinical Pharmacist Clinical phone for 08/11/2019 until 9:30p is [phone] 08/11/2019 5:45 PM  **Pharmacist phone directory can be found on [email or web address] listed under [place]**

## 2019-08-11 NOTE — Progress Notes (Signed)
Patient ID: [name], female   DOB: Jun 18, 1940, 79 y.o.   MRN: [id number]  [facility] Progress Note    Subjective:   Last HD on 3/15 with 1 kg UF.  one unmeasured urine void charted for 3/15.  May have kyphoplasty on 3/17 with IR per charting.  Normally HD at [facility]; she's not sure of EDW.  No dizziness or cramping with HD.  Has been on 2 liters oxygen - not normally on at home.   Review of systems:  Denies shortness of breath  Denies n/v Taking PO ok.   Back pain    Objective:   BP (!) 158/53 (BP Location: Left Arm)   Pulse 70   Temp 98.6 F (37 C) (Oral)   Resp 16   Ht 5\' 4"  (1.626 m)   Wt 73 kg   SpO2 100%   BMI 27.62 kg/m   Intake/Output: I/O last 3 completed shifts: In: 25 [P.O.:690; I.V.:3] Out: 1000 [Other:1000]   Intake/Output this shift:  No intake/output data recorded. Weight change: 0.6 kg  Physical Exam: Gen: elderly female in bed in NAD  CVS: S1S2 no rub   Resp: cta but diminished 2/2 reduced resp excursion with back pain  Abd: soft, NT/ND  Neuro - alert and oriented x 3; provides hx and follows commands Psych normal mood and affect Ext: no edema, RUE AVF +T/B     Labs: BMET Recent Labs  Lab 08/05/19 1445 08/07/19 1534 08/10/19 1201 08/10/19 1644  NA 131* 132* 132* 130*  K 4.5 4.4 4.4 3.5  CL 90* 91* 90* 91*  CO2 26 27 30 28   GLUCOSE 133* 268* 129* 149*  BUN 36* 46* 77* 27*  CREATININE 5.69* 6.06* 6.35* 2.88*  ALBUMIN 2.8* 2.7* 2.7* 2.8*  CALCIUM 8.2* 8.5* 9.3 8.3*  PHOS 6.4* 3.7 2.3* 1.2*   CBC Recent Labs  Lab 08/07/19 0539 08/09/19 0727 08/10/19 0509 08/10/19 1201  WBC 11.2* 6.6 7.3 8.9  HGB 10.5* 9.2* 8.7* 8.5*  HCT 36.0 31.1* 28.7* 28.6*  MCV 102.6* 101.0* 100.7* 101.4*  PLT 361 348 360 376      Medications:    . amiodarone  200 mg Oral Daily  . aspirin EC  81 mg Oral Daily  . calcium acetate  667-2,001 mg Oral TID WC  . Chlorhexidine Gluconate Cloth  6 each Topical Q0600  . darbepoetin  (ARANESP) injection - DIALYSIS  60 mcg Intravenous Q Tue-HD  . febuxostat  40 mg Oral Daily  . feeding supplement (NEPRO CARB STEADY)  237 mL Oral BID BM  . feeding supplement (PRO-STAT SUGAR FREE 64)  30 mL Oral BID  . insulin aspart  0-6 Units Subcutaneous TID WC  . lidocaine  1 patch Transdermal Q24H  . mouth rinse  15 mL Mouth Rinse BID  . multivitamin  1 tablet Oral QHS  . multivitamin with minerals  1 tablet Oral Daily  . pantoprazole  40 mg Oral Daily  . polyethylene glycol  17 g Oral Daily  . senna-docusate  1 tablet Oral QHS  . sodium chloride flush  3 mL Intravenous Q12H  . thyroid  120 mg Oral Daily  . cholecalciferol  5,000 Units Oral Daily     Assessment/ Plan:   1. AMS- presumably due to opioids, improved since admission off of fentanyl and gabapentin   2. Thoracic spine commpression fracture T6-7.  For Kyphoplasty per IR per her INR trends 3. ESRD continue with HD on MWF schedule  4. Anemia: cont with ESA.  Increase aranesp from 60 to 100 mcg IV weekly  5. CKD-MBD/Secondary hyperparathyroidism:  hypophos. renal panel in AM.  Replete phos today. Discontinue binders for now.  Unsure if lab accurate  6. Hypertension: continue current regimen; UF with HD 7. A fib with bradycardia- per cardiology  Claudia Desanctis, MD [facility] 08/11/2019, 9:32 AM

## 2019-08-11 NOTE — Progress Notes (Signed)
PROGRESS NOTE  [name] ZRA:[id number] DOB: 20-May-1941 DOA: 08/03/2019 PCP: Doree Albee, MD  Brief History: 79 year old female with a history of paroxysmal atrial fibrillation on warfarin, hypertension, diastolic CHF, ESRD(MWF), hyperlipidemia, hypothyroidism, coronary artery disease presenting with altered mental status. The patient was recently in the emergency department on 08/01/2019 after mechanical fall resulting in a T6-7 compression fracture. She was sent home in stable condition with hydrocodone. However, her caretaker and significant other found the patient with altered mental status and unresponsiveness. EMS was activated. The patient's mental status showed improvement after given Narcan. However, the patient was noted to have hyperkalemia with new oxygen requirement with bradycardia in the 30-40 range. Chest x-ray showed mild vascular congestion. The patient was given calcium gluconate and Lokelma in the emergency department with atropine. Cardiology and nephrology were consulted.  IR was consulted and kyphoplasty is planned 08/12/19.  Assessment/Plan: Acute toxic encephalopathy -Likely related to the patient's opioid medication--significant other was giving pt opioids q2-4 hour at home -Recently started on hydrocodone for her thoracic compression fractureas outpt -08/07/19--more confused, likely due to pt getting fentanyl AND hydrocodone -discontinue IV fentanyl and decrease gabapentin-->mental status improved -d/c gaba as significant other complains of confusion at bedtime-->improved  Thoracic spine compression fracture(T6-T7) -Unfortunately, patient is unable to sit up for transport or dialysis -CTthoracicspine--Oblique fracture through the T7 vertebral body. Minimal loss of height of T6 and T7, no more than 10-15%. No retropulsed bone. -Consult IR for possible kyphoplasty-->requesting MR thoracic spine to fully evaluate -Judicious opioid  treatment--start norco 5/325 -3/11 MR T-spine--acute T7 fx with <50% loss of height; Acute to subacute T6 fx with <50% loss of height -kyphoplasty tentatively planned for 08/12/19 if INR <1.5  Transient junctional bradycardia -Appreciate cardiology consult -May have been partly attributable to her hyperkalemia in the setting of ESRD -Amiodarone restarted -observe off of metoprolol -no further bradycardia or pauses  Chronic diastolic CHF -Volume status managed by hemodialysis -08/04/2019 echo EF of 65-70%, grade 2 DD, mild elevated PASP, mild MR/TR  ESRD -Appreciate nephrology consult -Hemodialysis on 08/07/2019, planned again on 3/17  Paroxysmal atrial fibrillation -Presently in sinus rhythm -Appreciate cardiology -continue warfarin -Restarted amiodarone  Hypothyroidism -Continue Armour Thyroid  Essential hypertension -Holding metoprolol succinate and amlodipine secondary to soft blood pressure initially  supratherapeutic INR -holding warfarin -give vitamin K 2.5 mg 3/15 and 3/16 -bridge with IV heparin       Disposition Plan: Patient From:Home D/C Place:SNF when able to sit upright for transport and HD Barriers:inability to sit up for transport or HD;  Family Communication:Significant other updatedat bedside 08/08/19  Consultants:Renal; cardiology  Code Status: FULL   DVT Prophylaxis:warfarin   Procedures: As Listed in Progress Note Above  Antibiotics: None    Subjective: Pt complains of back pain about same.  Patient denies fevers, chills, headache, chest pain, dyspnea, nausea, vomiting, diarrhea, abdominal pain, dysuria, hematuria, hematochezia, and melena.   Objective: Vitals:   08/10/19 1330 08/10/19 2118 08/11/19 0542 08/11/19 1412  BP: (!) 117/58 (!) 160/54 (!) 158/53 (!) 169/53  Pulse: 72 70 70 66  Resp: 18 20 16 16   Temp: 98.2 F (36.8 C) 98.4 F (36.9 C) 98.6 F (37 C) 98.6 F (37 C)  TempSrc: Oral Oral  Oral Oral  SpO2: 98% 98% 100% 98%  Weight: 73 kg  73 kg   Height:       No intake or output data in the 24 hours ending 08/11/19  1804 Weight change: 0.6 kg Exam:   General:  Pt is alert, follows commands appropriately, not in acute distress  HEENT: No icterus, No thrush, No neck mass, [place]/AT  Cardiovascular: RRR, S1/S2, no rubs, no gallops  Respiratory: bibasilar crackles. No wheeze  Abdomen: Soft/+BS, non tender, non distended, no guarding  Extremities: No edema, No lymphangitis, No petechiae, No rashes, no synovitis   Data Reviewed: I have personally reviewed following labs and imaging studies Basic Metabolic Panel: Recent Labs  Lab 08/05/19 1445 08/07/19 1534 08/10/19 1201 08/10/19 1644  NA 131* 132* 132* 130*  K 4.5 4.4 4.4 3.5  CL 90* 91* 90* 91*  CO2 26 27 30 28   GLUCOSE 133* 268* 129* 149*  BUN 36* 46* 77* 27*  CREATININE 5.69* 6.06* 6.35* 2.88*  CALCIUM 8.2* 8.5* 9.3 8.3*  PHOS 6.4* 3.7 2.3* 1.2*   Liver Function Tests: Recent Labs  Lab 08/05/19 1445 08/07/19 1534 08/10/19 1201 08/10/19 1644  ALBUMIN 2.8* 2.7* 2.7* 2.8*   No results for input(s): LIPASE, AMYLASE in the last 168 hours. No results for input(s): AMMONIA in the last 168 hours. Coagulation Profile: Recent Labs  Lab 08/07/19 0539 08/08/19 0723 08/09/19 0727 08/10/19 0509 08/11/19 0522  INR 2.7* 4.6* 3.4* 3.6* 1.8*   CBC: Recent Labs  Lab 08/05/19 1445 08/07/19 0539 08/09/19 0727 08/10/19 0509 08/10/19 1201  WBC 6.9 11.2* 6.6 7.3 8.9  HGB 9.0* 10.5* 9.2* 8.7* 8.5*  HCT 29.2* 36.0 31.1* 28.7* 28.6*  MCV 100.7* 102.6* 101.0* 100.7* 101.4*  PLT 306 361 348 360 376   Cardiac Enzymes: No results for input(s): CKTOTAL, CKMB, CKMBINDEX, TROPONINI in the last 168 hours. BNP: Invalid input(s): POCBNP CBG: Recent Labs  Lab 08/10/19 1647 08/10/19 2116 08/11/19 0745 08/11/19 1105 08/11/19 1624  GLUCAP 134* 105* 123* 120* 157*   HbA1C: No results for input(s): HGBA1C in  the last 72 hours. Urine analysis:    Component Value Date/Time   COLORURINE AMBER (A) 05/19/2019 0919   APPEARANCEUR HAZY (A) 05/19/2019 0919   LABSPEC 1.016 05/19/2019 0919   PHURINE 5.0 05/19/2019 0919   GLUCOSEU NEGATIVE 05/19/2019 0919   HGBUR NEGATIVE 05/19/2019 0919   BILIRUBINUR NEGATIVE 05/19/2019 0919   KETONESUR NEGATIVE 05/19/2019 0919   PROTEINUR 100 (A) 05/19/2019 0919   UROBILINOGEN 0.2 02/08/2015 1615   NITRITE NEGATIVE 05/19/2019 0919   LEUKOCYTESUR NEGATIVE 05/19/2019 0919   Sepsis Labs: @LABRCNTIP (procalcitonin:4,lacticidven:4) ) Recent Results (from the past 240 hour(s))  Blood culture (routine x 2)     Status: None   Collection Time: 08/03/19 11:21 AM   Specimen: BLOOD LEFT HAND  Result Value Ref Range Status   Specimen Description BLOOD LEFT HAND  Final   Special Requests   Final    BOTTLES DRAWN AEROBIC ONLY Blood Culture results may not be optimal due to an inadequate volume of blood received in culture bottles   Culture   Final    NO GROWTH 5 DAYS Performed at [facility], [address] [place], [place] [address]    Report Status 08/08/2019 FINAL  Final  Blood culture (routine x 2)     Status: None   Collection Time: 08/03/19  1:22 PM   Specimen: BLOOD RIGHT ARM  Result Value Ref Range Status   Specimen Description BLOOD RIGHT ARM  Final   Special Requests   Final    BOTTLES DRAWN AEROBIC AND ANAEROBIC Blood Culture adequate volume   Culture   Final    NO GROWTH 5 DAYS Performed  at [facility], [address] [place], [place] [address]    Report Status 08/08/2019 FINAL  Final  Respiratory Panel by RT PCR (Flu A&B, Covid) - Nasopharyngeal Swab     Status: None   Collection Time: 08/03/19  5:09 PM   Specimen: Nasopharyngeal Swab  Result Value Ref Range Status   SARS Coronavirus 2 by RT PCR NEGATIVE NEGATIVE Final    Comment: (NOTE) SARS-CoV-2 target nucleic acids are NOT DETECTED. The SARS-CoV-2 RNA is generally detectable in upper  respiratoy specimens during the acute phase of infection. The lowest concentration of SARS-CoV-2 viral copies this assay can detect is 131 copies/mL. A negative result does not preclude SARS-Cov-2 infection and should not be used as the sole basis for treatment or other patient management decisions. A negative result may occur with  improper specimen collection/handling, submission of specimen other than nasopharyngeal swab, presence of viral mutation(s) within the areas targeted by this assay, and inadequate number of viral copies (<131 copies/mL). A negative result must be combined with clinical observations, patient history, and epidemiological information. The expected result is Negative. Fact Sheet for Patients:  PinkCheek.be Fact Sheet for Healthcare Providers:  GravelBags.it This test is not yet ap proved or cleared by the [place] FDA and  has been authorized for detection and/or diagnosis of SARS-CoV-2 by FDA under an Emergency Use Authorization (EUA). This EUA will remain  in effect (meaning this test can be used) for the duration of the COVID-19 declaration under Section 564(b)(1) of the Act, 21 U.S.C. section 360bbb-3(b)(1), unless the authorization is terminated or revoked sooner.    Influenza A by PCR NEGATIVE NEGATIVE Final   Influenza B by PCR NEGATIVE NEGATIVE Final    Comment: (NOTE) The Xpert Xpress SARS-CoV-2/FLU/RSV assay is intended as an aid in  the diagnosis of influenza from Nasopharyngeal swab specimens and  should not be used as a sole basis for treatment. Nasal washings and  aspirates are unacceptable for Xpert Xpress SARS-CoV-2/FLU/RSV  testing. Fact Sheet for Patients: PinkCheek.be Fact Sheet for Healthcare Providers: GravelBags.it This test is not yet approved or cleared by the [place] FDA and  has been authorized for  detection and/or diagnosis of SARS-CoV-2 by  FDA under an Emergency Use Authorization (EUA). This EUA will remain  in effect (meaning this test can be used) for the duration of the  Covid-19 declaration under Section 564(b)(1) of the Act, 21  U.S.C. section 360bbb-3(b)(1), unless the authorization is  terminated or revoked. Performed at [facility], [address] [place], [place] [address]   MRSA PCR Screening     Status: None   Collection Time: 08/03/19  9:52 PM   Specimen: Nasal Mucosa; Nasopharyngeal  Result Value Ref Range Status   MRSA by PCR NEGATIVE NEGATIVE Final    Comment:        The GeneXpert MRSA Assay (FDA approved for NASAL specimens only), is one component of a comprehensive MRSA colonization surveillance program. It is not intended to diagnose MRSA infection nor to guide or monitor treatment for MRSA infections. Performed at [facility], [address], [place], [place] [address]      Scheduled Meds: . amiodarone  200 mg Oral Daily  . aspirin EC  81 mg Oral Daily  . Chlorhexidine Gluconate Cloth  6 each Topical Q0600  . Chlorhexidine Gluconate Cloth  6 each Topical Q0600  . darbepoetin (ARANESP) injection - DIALYSIS  100 mcg Intravenous Q Tue-HD  . febuxostat  40 mg Oral Daily  .  feeding supplement (NEPRO CARB STEADY)  237 mL Oral BID BM  . feeding supplement (PRO-STAT SUGAR FREE 64)  30 mL Oral BID  . insulin aspart  0-6 Units Subcutaneous TID WC  . lidocaine  1 patch Transdermal Q24H  . mouth rinse  15 mL Mouth Rinse BID  . multivitamin  1 tablet Oral QHS  . multivitamin with minerals  1 tablet Oral Daily  . pantoprazole  40 mg Oral Daily  . polyethylene glycol  17 g Oral Daily  . senna-docusate  1 tablet Oral QHS  . sodium chloride flush  3 mL Intravenous Q12H  . thyroid  120 mg Oral Daily  . cholecalciferol  5,000 Units Oral Daily   Continuous Infusions: . sodium chloride    . sodium chloride    . sodium chloride    . heparin 1,000 Units/hr  (08/11/19 1042)    Procedures/Studies: DG Ribs Unilateral W/Chest Right  Result Date: 08/01/2019 CLINICAL DATA:  Right posterior rib tenderness following a fall this morning. EXAM: RIGHT RIBS AND CHEST - 3+ VIEW COMPARISON:  Portable chest dated 05/19/2019 FINDINGS: Normal sized heart. Post CABG changes. Small amount of interval linear atelectasis or scarring at the lung bases. Stable left proximal humerus fixation hardware. Old, healed right rib fractures with no acute rib fractures or pneumothorax seen. Previously described T6 and T7 vertebral compression deformities, better visualized on the thoracic spine radiographs obtained at the same time. IMPRESSION: 1. No acute rib fractures seen. 2. Small amount of interval bibasilar linear atelectasis or scarring. 3. Separately described interval T6 and T7 vertebral compression deformities. Electronically Signed   By: Claudie Revering M.D.   On: 08/01/2019 13:45   DG Thoracic Spine 2 View  Result Date: 08/01/2019 CLINICAL DATA:  Midline back tenderness following a fall. Upper back pain. EXAM: THORACIC SPINE 2 VIEWS COMPARISON:  Chest radiographs dated 01/05/2019 FINDINGS: Site scratch the interval approximately 20% inferior endplate compression deformity of the T6 vertebral body and approximately 30% compression deformity of the T7 vertebral body. No significant bony retropulsion visualized. No well visualized acute fracture lines. Post CABG changes and previously demonstrated left humerus fixation hardware. Thoracolumbar spine degenerative changes. IMPRESSION: 1. Interval approximately 20% inferior endplate compression deformity of the T6 vertebral body and approximately 30% compression deformity of the T7 vertebral body. 2. Thoracolumbar spine degenerative changes. Electronically Signed   By: Claudie Revering M.D.   On: 08/01/2019 13:42   CT THORACIC SPINE WO CONTRAST  Result Date: 08/05/2019 CLINICAL DATA:  [place] on 08/01/2019.  Back pain. EXAM: CT THORACIC SPINE  WITHOUT CONTRAST TECHNIQUE: Multidetector CT images of the thoracic were obtained using the standard protocol without intravenous contrast. COMPARISON:  Radiography 08/01/2019.  CT chest 11/07/2018 2 FINDINGS: Alignment: Normal Vertebrae: The bones appear in general osteopenic. Widespread ankylosis of the spine. There do appear to be recent fractures at T6 and T7 with minimal loss of height. Fracture extends in an oblique fashion from the lower right vertebral body at T7, ascending upwards towards the left of the T7 vertebral body. The fracture involves the previously solid anterior osteophyte or syndesmophyte column. I can not show any involvement of the posterior elements, therefore it would be presumed stable. Paraspinal and other soft tissues: Bilateral pleural effusions layering dependently with dependent atelectasis, left more than right. Disc levels: No significant disc space pathology. No bony stenosis of the canal or foramina. 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 Oblique fracture through the  T7 vertebral body. Minimal loss of height of T6 and T7, no more than 10-15%. No retropulsed bone. No evidence of extension of the fracture through the fused posterior elements. Electronically Signed   By: Nelson Chimes M.D.   On: 08/05/2019 19:44   MR THORACIC SPINE WO CONTRAST  Result Date: 08/06/2019 CLINICAL DATA:  Mid back pain, CT with fracture EXAM: MRI THORACIC SPINE WITHOUT CONTRAST TECHNIQUE: Multiplanar, multisequence MR imaging of the thoracic spine was performed. No intravenous contrast was administered. COMPARISON:  Correlation made with CT thoracic spine 08/05/2019 FINDINGS: Axial images do not localize to sagittal images. Alignment:  No significant listhesis. Vertebrae: Vertebral body numbering is based on the prior CT as artifact distorts the upper cervical spine on the scout sequence. There are fractures through the T6  and T7 vertebral bodies. There is less than 50% loss of vertebral body height and no substantial osseous retropulsion. There is disruption of the likely calcified anterior longitudinal ligament as well as a bridging syndesmophyte are osteophyte at T7-T8. Posterior longitudinal ligament appears intact. Cord:  No abnormal signal. Paraspinal and other soft tissues: Probable prevertebral edema at T6-T8. Disc levels: Mild multilevel degenerative disc disease and facet arthropathy. There is no high-grade stenosis. IMPRESSION: Acute T7 vertebral body fracture with less than 50% loss of height. There is likely small focal disruption of the anterior longitudinal ligament at this level. Acute to subacute T6 vertebral body fracture with less than 50% loss of height. No significant osseous retropulsion. Electronically Signed   By: Macy Mis M.D.   On: 08/06/2019 14:35   DG Chest Portable 1 View  Result Date: 08/03/2019 CLINICAL DATA:  Altered level of consciousness, bradycardia EXAM: PORTABLE CHEST 1 VIEW COMPARISON:  08/01/2019 FINDINGS: Cardiomegaly. Prior CABG. Elevation of the left hemidiaphragm with left base atelectasis and small left effusion. No confluent opacity on the right. Mild vascular congestion. IMPRESSION: Mild cardiomegaly, vascular congestion. Elevation of the left hemidiaphragm with left base atelectasis and small left effusion. Electronically Signed   By: Rolm Baptise M.D.   On: 08/03/2019 11:58   ECHOCARDIOGRAM COMPLETE  Result Date: 08/04/2019    ECHOCARDIOGRAM REPORT   Patient Name:   CHENIKA NEVILS Date of Exam: 08/04/2019 Medical Rec #:  [id number]        Height:       64.0 in Accession #:    [id number]       Weight:       164.5 lb Date of Birth:  [birth year]        BSA:          1.800 m Patient Age:    14 years         BP:           139/42 mmHg Patient Gender: F                HR:           68 bpm. Exam Location:  [facility] Procedure: 2D Echo Indications:    Atrial Fibrillation 427.31 /  I48.91  History:        Patient has prior history of Echocardiogram examinations, most                 recent 11/10/2018. Arrythmias:Atrial Fibrillation; Risk                 Factors:Hypertension, Dyslipidemia and Diabetes. ESRD                 Respiratory  failure with hypoxia.  Sonographer:    Vikki Ports Turrentine Referring Phys: [place]  1. Left ventricular ejection fraction, by estimation, is 65 to 70%. The left ventricle has normal function. The left ventricle has no regional wall motion abnormalities. Left ventricular diastolic parameters are consistent with Grade II diastolic dysfunction (pseudonormalization).  2. Right ventricular systolic function is normal. The right ventricular size is normal. There is mildly elevated pulmonary artery systolic pressure. The estimated right ventricular systolic pressure is 61.9 mmHg.  3. Left atrial size was upper normal.  4. The mitral valve is degenerative. Mild mitral valve regurgitation.  5. The aortic valve is tricuspid. Aortic valve regurgitation is not visualized.  6. The inferior vena cava is normal in size with greater than 50% respiratory variability, suggesting right atrial pressure of 3 mmHg. FINDINGS  Left Ventricle: Left ventricular ejection fraction, by estimation, is 65 to 70%. The left ventricle has normal function. The left ventricle has no regional wall motion abnormalities. The left ventricular internal cavity size was normal in size. There is  no left ventricular hypertrophy. Left ventricular diastolic parameters are consistent with Grade II diastolic dysfunction (pseudonormalization). Right Ventricle: The right ventricular size is normal. No increase in right ventricular wall thickness. Right ventricular systolic function is normal. There is mildly elevated pulmonary artery systolic pressure. The tricuspid regurgitant velocity is 2.88  m/s, and with an assumed right atrial pressure of 3 mmHg, the estimated right ventricular systolic  pressure is 50.9 mmHg. Left Atrium: Left atrial size was upper normal. Right Atrium: Right atrial size was normal in size. Pericardium: There is no evidence of pericardial effusion. Mitral Valve: The mitral valve is degenerative in appearance. There is mild calcification of the mitral valve leaflet(s). Mild mitral annular calcification. Mild mitral valve regurgitation. Tricuspid Valve: The tricuspid valve is grossly normal. Tricuspid valve regurgitation is mild. Aortic Valve: The aortic valve is tricuspid. Aortic valve regurgitation is not visualized. Mild aortic valve annular calcification. Aortic valve mean gradient measures 7.0 mmHg. Aortic valve peak gradient measures 12.7 mmHg. Aortic valve area, by VTI measures 2.05 cm. Pulmonic Valve: The pulmonic valve was grossly normal. Pulmonic valve regurgitation is not visualized. Aorta: The aortic root is normal in size and structure. Venous: The inferior vena cava is normal in size with greater than 50% respiratory variability, suggesting right atrial pressure of 3 mmHg. IAS/Shunts: No atrial level shunt detected by color flow Doppler.  LEFT VENTRICLE PLAX 2D LVIDd:         4.98 cm  Diastology LVIDs:         3.44 cm  LV e' lateral:   14.00 cm/s LV PW:         0.74 cm  LV E/e' lateral: 9.3 LV IVS:        0.83 cm  LV e' medial:    4.57 cm/s LVOT diam:     1.80 cm  LV E/e' medial:  28.4 LV SV:         88 LV SV Index:   49 LVOT Area:     2.54 cm  RIGHT VENTRICLE RV S prime:     8.70 cm/s TAPSE (M-mode): 1.7 cm LEFT ATRIUM             Index       RIGHT ATRIUM           Index LA diam:        4.10 cm 2.28 cm/m  RA Area:     19.40  cm LA Vol (A2C):   42.7 ml 23.72 ml/m RA Volume:   56.80 ml  31.55 ml/m LA Vol (A4C):   58.8 ml 32.66 ml/m LA Biplane Vol: 53.3 ml 29.61 ml/m  AORTIC VALVE AV Area (Vmax):    1.72 cm AV Area (Vmean):   1.89 cm AV Area (VTI):     2.05 cm AV Vmax:           178.00 cm/s AV Vmean:          122.000 cm/s AV VTI:            0.431 m AV Peak Grad:       12.7 mmHg AV Mean Grad:      7.0 mmHg LVOT Vmax:         120.00 cm/s LVOT Vmean:        90.400 cm/s LVOT VTI:          0.347 m LVOT/AV VTI ratio: 0.81  AORTA Ao Root diam: 2.60 cm MITRAL VALVE                TRICUSPID VALVE MV Area (PHT): 6.54 cm     TR Peak grad:   33.2 mmHg MV Decel Time: 116 msec     TR Vmax:        288.00 cm/s MV E velocity: 130.00 cm/s MV A velocity: 111.00 cm/s  SHUNTS MV E/A ratio:  1.17         Systemic VTI:  0.35 m                             Systemic Diam: 1.80 cm Rozann Lesches MD Electronically signed by Rozann Lesches MD Signature Date/Time: 08/04/2019/10:07:37 AM    Final     Orson Eva, DO  [facility]  If 7PM-7AM, please contact night-coverage [email or web address] Password [email or web address] 08/11/2019, 6:04 PM   LOS: 7 days

## 2019-08-11 NOTE — Progress Notes (Signed)
Physical Therapy Treatment Patient Details Name: [name] MRN: [id number] DOB: 1941-04-16 Today's Date: 08/11/2019    History of Present Illness [name] is a 79 y.o. female with PMH significant for type 2 diabetes with nephropathy, A, fib (on coumadin), HTN, chronic diastolic HF, GERD, ESRD (M-W-F schedule), mixed HLD, hypothyroidism and recent ED visit after mechanical fall with findings of T6-T7 compression fracture (08/01/19); who was brought to ED by EMS secondary to episode of unresponsiveness and AMS. Patient with partial resolution to her presenting symptoms after Narcan given by EMS, but found with hyperkalemia, new requirement of oxygen supplementation and significantly bradycardic. Patient denies CP, fever, chills, hematochezia, melena, abd pain, blurred vision, skipping beat sensations or focal weakness.    PT Comments    Patient required some encouragement to participate today secondary to apprehension to move with fear of increase in symptoms. She is limited to supine exercises and requires frequent cueing for proper mechanics of exercise. She continues to be apprehensive about transition to seated. Patient is limited by c/o pain and wishes to end session today. Patient will benefit from continued physical therapy in hospital and recommended venue below to increase strength, balance, endurance for safe ADLs and gait.    Follow Up Recommendations  SNF     Equipment Recommendations  None recommended by PT    Recommendations for Other Services       Precautions / Restrictions Precautions Precautions: Fall Restrictions Weight Bearing Restrictions: No    Mobility  Bed Mobility Overal bed mobility: Needs Assistance Bed Mobility: Rolling Rolling: Min guard;Supervision         General bed mobility comments: Pt limited by pain, able to roll Independently though apprehensive due to increase in pain.  Pt requested to stay in bed as sitting increased  pain.  Transfers                    Ambulation/Gait                 Stairs             Wheelchair Mobility    Modified Rankin (Stroke Patients Only)       Balance                                            Cognition Arousal/Alertness: Awake/alert Behavior During Therapy: WFL for tasks assessed/performed Overall Cognitive Status: Within Functional Limits for tasks assessed                                        Exercises General Exercises - Lower Extremity Ankle Circles/Pumps: AROM;Both;20 reps;Supine Quad Sets: AROM;Both;20 reps;Supine Heel Slides: AROM;Both;20 reps;Supine Straight Leg Raises: AROM;Both;20 reps;Supine Other Exercises Other Exercises: LTR 10x10", SKTC 2x 20", DKTC 2x 20"    General Comments        Pertinent Vitals/Pain Faces Pain Scale: Hurts even more Pain Location: back Pain Intervention(s): Limited activity within patient's tolerance;Monitored during session;Premedicated before session    Home Living                      Prior Function            PT Goals (current goals can now be found in the care plan section)  Acute Rehab PT Goals Patient Stated Goal: return home PT Goal Formulation: With patient Time For Goal Achievement: 08/18/19 Potential to Achieve Goals: Fair    Frequency    Min 3X/week      PT Plan Current plan remains appropriate    Co-evaluation              AM-PAC PT "6 Clicks" Mobility   Outcome Measure  Help needed turning from your back to your side while in a flat bed without using bedrails?: A Lot Help needed moving from lying on your back to sitting on the side of a flat bed without using bedrails?: A Lot Help needed moving to and from a bed to a chair (including a wheelchair)?: A Lot Help needed standing up from a chair using your arms (e.g., wheelchair or bedside chair)?: A Lot Help needed to walk in hospital room?: A Lot Help  needed climbing 3-5 steps with a railing? : A Lot 6 Click Score: 12    End of Session   Activity Tolerance: Patient limited by pain Patient left: in bed;with call bell/phone within reach;with family/visitor present;with bed alarm set Nurse Communication: Mobility status;Patient requests pain meds PT Visit Diagnosis: Unsteadiness on feet (R26.81);Other abnormalities of gait and mobility (R26.89);Muscle weakness (generalized) (M62.81)     Time: 4665-9935 PT Time Calculation (min) (ACUTE ONLY): 9 min  Charges:  $Therapeutic Exercise: 8-22 mins                     3:29 PM, 08/11/19 Mearl Latin PT, DPT Physical Therapist at [facility]

## 2019-08-12 ENCOUNTER — Ambulatory Visit (HOSPITAL_COMMUNITY)
Admission: RE | Admit: 2019-08-12 | Discharge: 2019-08-12 | Disposition: A | Discharge status: Home / Self Care | Payer: Medicare HMO | Source: Ambulatory Visit | Attending: Internal Medicine | Admitting: Internal Medicine

## 2019-08-12 DIAGNOSIS — S22000S Wedge compression fracture of unspecified thoracic vertebra, sequela: Secondary | ICD-10-CM | POA: Insufficient documentation

## 2019-08-12 DIAGNOSIS — W19XXXS Unspecified fall, sequela: Secondary | ICD-10-CM | POA: Insufficient documentation

## 2019-08-12 DIAGNOSIS — M4854XA Collapsed vertebra, not elsewhere classified, thoracic region, initial encounter for fracture: Secondary | ICD-10-CM | POA: Diagnosis not present

## 2019-08-12 DIAGNOSIS — M546 Pain in thoracic spine: Secondary | ICD-10-CM | POA: Diagnosis not present

## 2019-08-12 HISTORY — PX: IR KYPHO EA ADDL LEVEL THORACIC OR LUMBAR: IMG5520

## 2019-08-12 HISTORY — PX: IR KYPHO THORACIC WITH BONE BIOPSY: IMG5518

## 2019-08-12 LAB — GLUCOSE, CAPILLARY
Glucose-Capillary: 123 mg/dL — ABNORMAL HIGH (ref 70–99)
Glucose-Capillary: 127 mg/dL — ABNORMAL HIGH (ref 70–99)
Glucose-Capillary: 127 mg/dL — ABNORMAL HIGH (ref 70–99)
Glucose-Capillary: 108 mg/dL — ABNORMAL HIGH (ref 70–99)

## 2019-08-12 LAB — RENAL FUNCTION PANEL
Albumin: 3 g/dL — ABNORMAL LOW (ref 3.5–5.0)
Anion gap: 12 (ref 5–15)
BUN: 56 mg/dL — ABNORMAL HIGH (ref 8–23)
CO2: 27 mmol/L (ref 22–32)
Calcium: 8.9 mg/dL (ref 8.9–10.3)
Chloride: 88 mmol/L — ABNORMAL LOW (ref 98–111)
Creatinine, Ser: 5.2 mg/dL — ABNORMAL HIGH (ref 0.44–1.00)
GFR calc Af Amer: 8 mL/min — ABNORMAL LOW (ref >60)
GFR calc non Af Amer: 7 mL/min — ABNORMAL LOW (ref >60)
Glucose, Bld: 127 mg/dL — ABNORMAL HIGH (ref 70–99)
Phosphorus: 4 mg/dL (ref 2.5–4.6)
Potassium: 4.5 mmol/L (ref 3.5–5.1)
Sodium: 127 mmol/L — ABNORMAL LOW (ref 135–145)

## 2019-08-12 LAB — CBC
HCT: 30.6 % — ABNORMAL LOW (ref 36.0–46.0)
Hemoglobin: 9.3 g/dL — ABNORMAL LOW (ref 12.0–15.0)
MCH: 30.2 pg (ref 26.0–34.0)
MCHC: 30.4 g/dL (ref 30.0–36.0)
MCV: 99.4 fL (ref 80.0–100.0)
Platelets: 383 10*3/uL (ref 150–400)
RBC: 3.08 MIL/uL — ABNORMAL LOW (ref 3.87–5.11)
RDW: 17.1 % — ABNORMAL HIGH (ref 11.5–15.5)
WBC: 7.1 10*3/uL (ref 4.0–10.5)
nRBC: 0 % (ref 0.0–0.2)

## 2019-08-12 LAB — HEPARIN LEVEL (UNFRACTIONATED): Heparin Unfractionated: 0.68 IU/mL (ref 0.30–0.70)

## 2019-08-12 LAB — PROTIME-INR
INR: 1.4 — ABNORMAL HIGH (ref 0.8–1.2)
Prothrombin Time: 17.1 seconds — ABNORMAL HIGH (ref 11.4–15.2)

## 2019-08-12 MED ORDER — CEFAZOLIN SODIUM-DEXTROSE 2-4 GM/100ML-% IV SOLN
INTRAVENOUS | Status: AC
  Filled 2019-08-12: qty 100

## 2019-08-12 MED ORDER — FENTANYL CITRATE (PF) 100 MCG/2ML IJ SOLN
INTRAMUSCULAR | Status: AC
  Filled 2019-08-12: qty 2

## 2019-08-12 MED ORDER — HYDROXYZINE HCL 10 MG PO TABS: 10.0000 mg | ORAL_TABLET | Freq: Three times a day (TID) | ORAL | Status: DC | PRN

## 2019-08-12 MED ORDER — GELATIN ABSORBABLE 12-7 MM EX MISC
CUTANEOUS | Status: AC
  Filled 2019-08-12: qty 1

## 2019-08-12 MED ORDER — HYDROXYZINE HCL 50 MG/ML IM SOLN
25.0000 mg | Freq: Four times a day (QID) | INTRAMUSCULAR | Status: AC | PRN
  Administered 2019-08-12: 25 mg via INTRAMUSCULAR
  Filled 2019-08-12: qty 1

## 2019-08-12 MED ORDER — MIDAZOLAM HCL 2 MG/2ML IJ SOLN
INTRAMUSCULAR | Status: AC | PRN
  Administered 2019-08-12: 1 mg via INTRAVENOUS

## 2019-08-12 MED ORDER — IOHEXOL 300 MG/ML  SOLN
50.0000 mL | Freq: Once | INTRAMUSCULAR | Status: AC | PRN
  Administered 2019-08-12: 10 mL

## 2019-08-12 MED ORDER — CEFAZOLIN SODIUM-DEXTROSE 2-4 GM/100ML-% IV SOLN
2.0000 g | Freq: Once | INTRAVENOUS | Status: AC
  Administered 2019-08-12: 2 g via INTRAVENOUS

## 2019-08-12 MED ORDER — FENTANYL CITRATE (PF) 100 MCG/2ML IJ SOLN
INTRAMUSCULAR | Status: AC | PRN
  Administered 2019-08-12: 25 ug via INTRAVENOUS

## 2019-08-12 MED ORDER — WARFARIN SODIUM 5 MG PO TABS
5.0000 mg | ORAL_TABLET | Freq: Once | ORAL | Status: AC
  Administered 2019-08-12: 5 mg via ORAL
  Filled 2019-08-12: qty 1

## 2019-08-12 MED ORDER — SODIUM CHLORIDE 0.9 % IV SOLN: INTRAVENOUS | Status: AC

## 2019-08-12 MED ORDER — MIDAZOLAM HCL 2 MG/2ML IJ SOLN
INTRAMUSCULAR | Status: AC
  Filled 2019-08-12: qty 2

## 2019-08-12 MED ORDER — METOPROLOL TARTRATE 5 MG/5ML IV SOLN: 5.0000 mg | Freq: Four times a day (QID) | INTRAVENOUS | Status: DC | PRN

## 2019-08-12 MED ORDER — TOBRAMYCIN SULFATE 1.2 G IJ SOLR
INTRAMUSCULAR | Status: AC
  Filled 2019-08-12: qty 1.2

## 2019-08-12 MED ORDER — BUPIVACAINE HCL (PF) 0.5 % IJ SOLN
INTRAMUSCULAR | Status: AC
  Filled 2019-08-12: qty 60

## 2019-08-12 MED ORDER — BUPIVACAINE HCL (PF) 0.5 % IJ SOLN
INTRAMUSCULAR | Status: AC | PRN
  Administered 2019-08-12: 20 mL

## 2019-08-12 MED ORDER — WARFARIN - PHARMACIST DOSING INPATIENT: Freq: Every day | Status: DC

## 2019-08-12 MED ORDER — SODIUM CHLORIDE 0.9 % IV SOLN
INTRAVENOUS | Status: AC | PRN
  Administered 2019-08-12: 10 mL/h via INTRAVENOUS

## 2019-08-12 NOTE — Progress Notes (Signed)
Patient ID: UBAH RADKE, female   DOB: 1941-01-10, 79 y.o.   MRN: [id number]  [facility] Progress Note    Subjective:   Last HD on 3/15 with 1 kg UF.  She is for kyphoplasty today with IR per charting and is to be at IR at 11 per their note.  Not on oxygen at home.  Has been weaned to 1 liters oxygen here.  No dizziness or cramping with last HD.  She's uncomfortable in bed and hopes procedure will help.   Review of systems:   Denies shortness of breath  Denies n/v Back pain  Has been NPO for procedure today    Objective:   BP (!) 174/54   Pulse 68   Temp 97.9 F (36.6 C) (Oral)   Resp 16   Ht 5\' 4"  (1.626 m)   Wt 72.1 kg   SpO2 100%   BMI 27.28 kg/m   Intake/Output: I/O last 3 completed shifts: In: 182.5 [I.V.:182.5] Out: 600 [Urine:600]   Intake/Output this shift:  No intake/output data recorded. Weight change: -1.9 kg  Physical Exam:  Gen: elderly female in bed in NAD  CVS: S1S2 no rub   Resp: cta but diminished 2/2 reduced resp excursion with back pain  Abd: soft, NT/ND  Neuro - alert and oriented x 3; provides hx and follows commands Psych normal mood and affect Ext: trace if any edema, RUE AVF +T/B     Labs: BMET Recent Labs  Lab 08/05/19 1445 08/07/19 1534 08/10/19 1201 08/10/19 1644 08/12/19 0738  NA 131* 132* 132* 130* 127*  K 4.5 4.4 4.4 3.5 4.5  CL 90* 91* 90* 91* 88*  CO2 26 27 30 28 27   GLUCOSE 133* 268* 129* 149* 127*  BUN 36* 46* 77* 27* 56*  CREATININE 5.69* 6.06* 6.35* 2.88* 5.20*  ALBUMIN 2.8* 2.7* 2.7* 2.8* 3.0*  CALCIUM 8.2* 8.5* 9.3 8.3* 8.9  PHOS 6.4* 3.7 2.3* 1.2* 4.0   CBC Recent Labs  Lab 08/09/19 0727 08/10/19 0509 08/10/19 1201 08/12/19 0738  WBC 6.6 7.3 8.9 7.1  HGB 9.2* 8.7* 8.5* 9.3*  HCT 31.1* 28.7* 28.6* 30.6*  MCV 101.0* 100.7* 101.4* 99.4  PLT 348 360 376 383      Medications:    . amiodarone  200 mg Oral Daily  . aspirin EC  81 mg Oral Daily  . Chlorhexidine Gluconate Cloth  6  each Topical Q0600  . Chlorhexidine Gluconate Cloth  6 each Topical Q0600  . darbepoetin (ARANESP) injection - DIALYSIS  100 mcg Intravenous Q Tue-HD  . febuxostat  40 mg Oral Daily  . feeding supplement (NEPRO CARB STEADY)  237 mL Oral BID BM  . feeding supplement (PRO-STAT SUGAR FREE 64)  30 mL Oral BID  . insulin aspart  0-6 Units Subcutaneous TID WC  . lidocaine  1 patch Transdermal Q24H  . mouth rinse  15 mL Mouth Rinse BID  . multivitamin  1 tablet Oral QHS  . multivitamin with minerals  1 tablet Oral Daily  . pantoprazole  40 mg Oral Daily  . polyethylene glycol  17 g Oral Daily  . senna-docusate  1 tablet Oral QHS  . sodium chloride flush  3 mL Intravenous Q12H  . thyroid  120 mg Oral Daily  . cholecalciferol  5,000 Units Oral Daily     Assessment/ Plan:   1. AMS- presumably due to opioids, improved since admission off of fentanyl and gabapentin   2. Thoracic spine commpression fracture  T6-7.  For Kyphoplasty per IR  3. ESRD continue with HD on MWF schedule; HD after kyphoplasty today 4. Anemia: cont with ESA.  Increased aranesp from 60 to 100 mcg IV weekly.  Wasn't given on 3/16 and was scheduled for 3/17 5. CKD-MBD/Secondary hyperparathyroidism:  hypophos. renal panel in AM.  Repleted phos. Discontinued binders for now given hx hypophosphatemia.  Unsure if lab accurate - trend 6. Hypertension: continue current regimen; UF with HD 7. A fib with hx bradycardia- per cardiology  Claudia Desanctis, MD [facility] 08/12/2019, 8:55 AM

## 2019-08-12 NOTE — Progress Notes (Signed)
PT Cancellation Note  Patient Details Name: [name] MRN: [id number] DOB: 1940/09/17   Cancelled Treatment:    Reason Eval/Treat Not Completed: Patient at procedure or test/unavailable.  Patient going out to [facility] for surgery today and will need new orders to resume physical therapy after surgery.  Thank you.   10:00 AM, 08/12/19 Lonell Grandchild, MPT Physical Therapist with [facility] [phone] office [phone] mobile phone

## 2019-08-12 NOTE — Progress Notes (Signed)
   08/12/19 1509  Hand-Off documentation  Handoff Received Received from shift RN/LPN  Report received from (Full Name) Fredric Dine, RN (IR [facility])  Patient returned to room from procedure at [facility]. Patient easily awakens but drowsy. Lying flat on back per order, on 2 L Perla. Telemetry monitoring resumed per order.

## 2019-08-12 NOTE — Discharge Instructions (Signed)
1. No stooping,bending or lifting more than 10 LBs for 2 weeks. 2.Use walker to ambulate for 2 weeks. 3.No driving for 2 weeks. 4.RTC PRN  2 weeks.

## 2019-08-12 NOTE — Progress Notes (Signed)
ANTICOAGULATION CONSULT NOTE - Follow Up Consult  Pharmacy Consult for heparin infusion Indication: atrial fibrillation bridging  Allergies  Allergen Reactions  . Insulin Glargine Swelling    "Makes me swell like a balloon all over", including face, but without any respiratory distress or rashes. Associated with weight gain.  . Statins Other (See Comments)    "I've tried them all; my muscle aches were so bad I couldn't walk".    Patient Measurements: Height: 5\' 4"  (162.6 cm) Weight: 158 lb 15.2 oz (72.1 kg) IBW/kg (Calculated) : 54.7 HEPARIN DW (KG): 69.7  Vital Signs: Temp: 97.9 F (36.6 C) (03/16 2354) Temp Source: Oral (03/16 2354) BP: 174/54 (03/16 2354) Pulse Rate: 68 (03/16 2354)  Labs: Recent Labs    08/10/19 0509 08/10/19 0509 08/10/19 1201 08/10/19 1644 08/11/19 0522 08/11/19 1654 08/12/19 0738  HGB 8.7*   < > 8.5*  --   --   --  9.3*  HCT 28.7*  --  28.6*  --   --   --  30.6*  PLT 360  --  376  --   --   --  383  LABPROT 35.7*  --   --   --  21.2*  --  17.1*  INR 3.6*  --   --   --  1.8*  --  1.4*  HEPARINUNFRC  --   --   --   --   --  0.39 0.68  CREATININE  --   --  6.35* 2.88*  --   --  5.20*   < > = values in this interval not displayed.    Estimated Creatinine Clearance: 8.5 mL/min (A) (by C-G formula based on SCr of 5.2 mg/dL (H)).  Assessment: Patient with afib and on chronic warfarin to have kyphoplasty. Plan is to bridge with heparin once INR < 2. INR today is 1.8 after Vit. K 2.5mg  po x 1 dose on 3/15. Heparin started without bolus.  Last dose of  Warfarin   7.5mg  given on  on 08/06/19     Heparin level is therapeutic at 0.68 on 1000 units/hr. No bleeding noted. Plan is for kyphoplasty at [facility] today since INR 1.4.  Goal of Therapy:  Heparin level 0.3-0.7 units/ml Monitor platelets by anticoagulation protocol: Yes   Plan:  Heparin off now- Plan for KP at [facility] @ 1100 today Follow-up plans post procedure Monitor daily HL, CBC Monitor  for s/s bleeding  Thank you for involving pharmacy in this patient's care.  Margot Ables, PharmD Clinical Pharmacist 08/12/2019 8:29 AM

## 2019-08-12 NOTE — Procedures (Signed)
S/P T 6 and T7 balloon kyphoplasty. S.Hondo Nanda MD

## 2019-08-12 NOTE — Progress Notes (Addendum)
Patient ID: [name], female   DOB: 1940-07-03, 79 y.o.   MRN: [id number]   INR 1.4 today Will move ahead with KP at [facility] IR today  Pt is to be at [place] 1100 am via ambulance  Hep off now    I have spoken to RN Hep off now To be at [facility] by 1100 am today

## 2019-08-12 NOTE — Sedation Documentation (Signed)
Pt transported back to [facility] by care link. Pt awake and alert. In no distress.

## 2019-08-12 NOTE — Care Management Important Message (Signed)
Important Message  Patient Details  Name: [name] MRN: [id number] Date of Birth: 1940-06-29   Medicare Important Message Given:  Yes     Tommy Medal 08/12/2019, 2:58 PM

## 2019-08-12 NOTE — Progress Notes (Signed)
ANTICOAGULATION CONSULT NOTE - Follow Up Consult  Pharmacy Consult for warfarin Indication: atrial fibrillation   Allergies  Allergen Reactions  . Insulin Glargine Swelling    "Makes me swell like a balloon all over", including face, but without any respiratory distress or rashes. Associated with weight gain.  . Statins Other (See Comments)    "I've tried them all; my muscle aches were so bad I couldn't walk".    Patient Measurements: Height: 5\' 4"  (162.6 cm) Weight: 158 lb 15.2 oz (72.1 kg) IBW/kg (Calculated) : 54.7 HEPARIN DW (KG): 69.7  Vital Signs: BP: 164/66 (03/17 1400) Pulse Rate: 65 (03/17 1400)  Labs: Recent Labs    08/10/19 0509 08/10/19 0509 08/10/19 1201 08/10/19 1644 08/11/19 0522 08/11/19 1654 08/12/19 0738  HGB 8.7*   < > 8.5*  --   --   --  9.3*  HCT 28.7*  --  28.6*  --   --   --  30.6*  PLT 360  --  376  --   --   --  383  LABPROT 35.7*  --   --   --  21.2*  --  17.1*  INR 3.6*  --   --   --  1.8*  --  1.4*  HEPARINUNFRC  --   --   --   --   --  0.39 0.68  CREATININE  --   --  6.35* 2.88*  --   --  5.20*   < > = values in this interval not displayed.    Estimated Creatinine Clearance: 8.5 mL/min (A) (by C-G formula based on SCr of 5.2 mg/dL (H)).  Assessment: Patient with afib and on chronic warfarin completed kyphoplasty 3/17. Restart warfarin post procedure with current INR 1.4.   Last dose of  Warfarin   7.5mg  given on  on 08/06/19  Vitamin K 2.5 mg PO given 3/15 and 3/16    Patient on chronic amiodarone 200 mg daily  Home dose listed as 5 mg Tue/Sat and 7.5 mg ROW. Patient supratherapeutic during admission so will be cautious with dosing.  Goal of Therapy:  INR 2-3 Monitor platelets by anticoagulation protocol: Yes   Plan:  Warfarin 5 mg x 1 dose. Monitor daily INR and s/s of bleeding.   Thank you for involving pharmacy in this patient's care.  Margot Ables, PharmD Clinical Pharmacist 08/12/2019 3:40 PM

## 2019-08-12 NOTE — Progress Notes (Signed)
PROGRESS NOTE  [name] HLK:[id number] DOB: 04-18-1941 DOA: 08/03/2019 PCP: Doree Albee, MD  Brief History: 79 year old female with a history of paroxysmal atrial fibrillation on warfarin, hypertension, diastolic CHF, ESRD(MWF), hyperlipidemia, hypothyroidism, coronary artery disease presenting with altered mental status. The patient was recently in the emergency department on 08/01/2019 after mechanical fall resulting in a T6-7 compression fracture. She was sent home in stable condition with hydrocodone. However, her caretaker and significant other found the patient with altered mental status and unresponsiveness. EMS was activated. The patient's mental status showed improvement after given Narcan. However, the patient was noted to have hyperkalemia with new oxygen requirement with bradycardia in the 30-40 range. Chest x-ray showed mild vascular congestion. The patient was given calcium gluconate and Lokelma in the emergency department with atropine. Cardiology and nephrology were consulted.  IR was consulted and kyphoplasty is planned 08/12/19.  Assessment/Plan: Acute toxic encephalopathy -Likely related to the patient's opioid medication--significant other was giving pt opioids q2-4 hour at home -Recently started on hydrocodone for her thoracic compression fractureas outpt -08/07/19--more confused, likely due to pt getting fentanyl AND hydrocodone -discontinue IV fentanyl and decrease gabapentin-->mental status improved -d/c gaba as significant other complains of confusion at bedtime-->improved  Thoracic spine compression fracture(T6-T7) -Unfortunately, patient is unable to sit up for transport or dialysis -CTthoracicspine--Oblique fracture through the T7 vertebral body. Minimal loss of height of T6 and T7, no more than 10-15%. No retropulsed bone. -Consult IR for possible kyphoplasty-->requesting MR thoracic spine to fully evaluate -Judicious opioid  treatment--start norco 5/325 -3/11 MR T-spine--acute T7 fx with <50% loss of height; Acute to subacute T6 fx with <50% loss of height -kyphoplasty tentatively planned for 08/12/19 if INR <1.5, per IR, ok to proceed INR 1.4 today.    Transient junctional bradycardia -Appreciate cardiology consult -May have been partly attributable to her hyperkalemia in the setting of ESRD -Amiodarone restarted -observe off of metoprolol -no further bradycardia or pauses  Chronic diastolic CHF -Volume status managed by hemodialysis -08/04/2019 echo EF of 65-70%, grade 2 DD, mild elevated PASP, mild MR/TR  ESRD -Appreciate nephrology consult -Hemodialysis on 08/07/2019, planned again on 3/17  Paroxysmal atrial fibrillation -Presently in sinus rhythm -Appreciate cardiology -continue warfarin -Restarted amiodarone  Hypothyroidism -Continue [place] Thyroid  Essential hypertension -Holding metoprolol succinate and amlodipine secondary to soft blood pressure initially  supratherapeutic INR -holding warfarin -s/p vitamin K 2.5 mg 3/15 and 3/16 -bridge with IV heparin  Disposition Plan: Patient From:Home D/C Place:SNF when able to sit upright for transport and HD Barriers:inability to sit up for transport or HD;  Family Communication:Significant other updatedat bedside 08/08/19  Consultants:Renal; cardiology  Code Status: FULL   DVT Prophylaxis:warfarin   Procedures: As Listed in Progress Note Above  Antibiotics: None  Subjective: Pt agreeable to go to [place] for kyphoplasty as she continues to have back pain, hopefully this can help, no loss of bowel or bladder control.    Objective: Vitals:   08/11/19 1412 08/11/19 2354 08/12/19 0500 08/12/19 0804  BP: (!) 169/53 (!) 174/54    Pulse: 66 68    Resp: 16     Temp: 98.6 F (37 C) 97.9 F (36.6 C)    TempSrc: Oral Oral    SpO2: 98% 98%  100%  Weight:   72.1 kg   Height:        Intake/Output  Summary (Last 24 hours) at 08/12/2019 1235 Last data filed at 08/12/2019 0543  Gross per 24 hour  Intake 182.53 ml  Output 600 ml  Net -417.47 ml   Weight change: -1.9 kg Exam:   General:  Lying flat in bed, NAD. Cooperative, somnolent.   HEENT: neck supple, [place]/AT  Cardiovascular: normal S1/S2, no rubs, no gallops  Respiratory: BBS CTA. No wheeze  Abdomen: Soft/+BS, non tender, non distended, no guarding  Extremities: No CCE.   Data Reviewed: I have personally reviewed following labs and imaging studies Basic Metabolic Panel: Recent Labs  Lab 08/05/19 1445 08/07/19 1534 08/10/19 1201 08/10/19 1644 08/12/19 0738  NA 131* 132* 132* 130* 127*  K 4.5 4.4 4.4 3.5 4.5  CL 90* 91* 90* 91* 88*  CO2 26 27 30 28 27   GLUCOSE 133* 268* 129* 149* 127*  BUN 36* 46* 77* 27* 56*  CREATININE 5.69* 6.06* 6.35* 2.88* 5.20*  CALCIUM 8.2* 8.5* 9.3 8.3* 8.9  PHOS 6.4* 3.7 2.3* 1.2* 4.0   Liver Function Tests: Recent Labs  Lab 08/05/19 1445 08/07/19 1534 08/10/19 1201 08/10/19 1644 08/12/19 0738  ALBUMIN 2.8* 2.7* 2.7* 2.8* 3.0*   No results for input(s): LIPASE, AMYLASE in the last 168 hours. No results for input(s): AMMONIA in the last 168 hours. Coagulation Profile: Recent Labs  Lab 08/08/19 0723 08/09/19 0727 08/10/19 0509 08/11/19 0522 08/12/19 0738  INR 4.6* 3.4* 3.6* 1.8* 1.4*   CBC: Recent Labs  Lab 08/07/19 0539 08/09/19 0727 08/10/19 0509 08/10/19 1201 08/12/19 0738  WBC 11.2* 6.6 7.3 8.9 7.1  HGB 10.5* 9.2* 8.7* 8.5* 9.3*  HCT 36.0 31.1* 28.7* 28.6* 30.6*  MCV 102.6* 101.0* 100.7* 101.4* 99.4  PLT 361 348 360 376 383   Cardiac Enzymes: No results for input(s): CKTOTAL, CKMB, CKMBINDEX, TROPONINI in the last 168 hours. BNP: Invalid input(s): POCBNP CBG: Recent Labs  Lab 08/11/19 1105 08/11/19 1624 08/11/19 2054 08/12/19 0745 08/12/19 1104  GLUCAP 120* 157* 145* 123* 127*   HbA1C: No results for input(s): HGBA1C in the last 72 hours. Urine  analysis:    Component Value Date/Time   COLORURINE AMBER (A) 05/19/2019 0919   APPEARANCEUR HAZY (A) 05/19/2019 0919   LABSPEC 1.016 05/19/2019 0919   PHURINE 5.0 05/19/2019 0919   GLUCOSEU NEGATIVE 05/19/2019 0919   HGBUR NEGATIVE 05/19/2019 0919   BILIRUBINUR NEGATIVE 05/19/2019 0919   KETONESUR NEGATIVE 05/19/2019 0919   PROTEINUR 100 (A) 05/19/2019 0919   UROBILINOGEN 0.2 02/08/2015 1615   NITRITE NEGATIVE 05/19/2019 0919   LEUKOCYTESUR NEGATIVE 05/19/2019 0919    Recent Results (from the past 240 hour(s))  Blood culture (routine x 2)     Status: None   Collection Time: 08/03/19 11:21 AM   Specimen: BLOOD LEFT HAND  Result Value Ref Range Status   Specimen Description BLOOD LEFT HAND  Final   Special Requests   Final    BOTTLES DRAWN AEROBIC ONLY Blood Culture results may not be optimal due to an inadequate volume of blood received in culture bottles   Culture   Final    NO GROWTH 5 DAYS Performed at [facility], [address] [place], [place] [address]    Report Status 08/08/2019 FINAL  Final  Blood culture (routine x 2)     Status: None   Collection Time: 08/03/19  1:22 PM   Specimen: BLOOD RIGHT ARM  Result Value Ref Range Status   Specimen Description BLOOD RIGHT ARM  Final   Special Requests   Final    BOTTLES DRAWN AEROBIC AND ANAEROBIC Blood Culture adequate volume  Culture   Final    NO GROWTH 5 DAYS Performed at [facility], [address], [place], [place] [address]    Report Status 08/08/2019 FINAL  Final  Respiratory Panel by RT PCR (Flu A&B, Covid) - Nasopharyngeal Swab     Status: None   Collection Time: 08/03/19  5:09 PM   Specimen: Nasopharyngeal Swab  Result Value Ref Range Status   SARS Coronavirus 2 by RT PCR NEGATIVE NEGATIVE Final    Comment: (NOTE) SARS-CoV-2 target nucleic acids are NOT DETECTED. The SARS-CoV-2 RNA is generally detectable in upper respiratoy specimens during the acute phase of infection. The lowest concentration of  SARS-CoV-2 viral copies this assay can detect is 131 copies/mL. A negative result does not preclude SARS-Cov-2 infection and should not be used as the sole basis for treatment or other patient management decisions. A negative result may occur with  improper specimen collection/handling, submission of specimen other than nasopharyngeal swab, presence of viral mutation(s) within the areas targeted by this assay, and inadequate number of viral copies (<131 copies/mL). A negative result must be combined with clinical observations, patient history, and epidemiological information. The expected result is Negative. Fact Sheet for Patients:  PinkCheek.be Fact Sheet for Healthcare Providers:  GravelBags.it This test is not yet ap proved or cleared by the [place] FDA and  has been authorized for detection and/or diagnosis of SARS-CoV-2 by FDA under an Emergency Use Authorization (EUA). This EUA will remain  in effect (meaning this test can be used) for the duration of the COVID-19 declaration under Section 564(b)(1) of the Act, 21 U.S.C. section 360bbb-3(b)(1), unless the authorization is terminated or revoked sooner.    Influenza A by PCR NEGATIVE NEGATIVE Final   Influenza B by PCR NEGATIVE NEGATIVE Final    Comment: (NOTE) The Xpert Xpress SARS-CoV-2/FLU/RSV assay is intended as an aid in  the diagnosis of influenza from Nasopharyngeal swab specimens and  should not be used as a sole basis for treatment. Nasal washings and  aspirates are unacceptable for Xpert Xpress SARS-CoV-2/FLU/RSV  testing. Fact Sheet for Patients: PinkCheek.be Fact Sheet for Healthcare Providers: GravelBags.it This test is not yet approved or cleared by the [place] FDA and  has been authorized for detection and/or diagnosis of SARS-CoV-2 by  FDA under an Emergency Use Authorization (EUA).  This EUA will remain  in effect (meaning this test can be used) for the duration of the  Covid-19 declaration under Section 564(b)(1) of the Act, 21  U.S.C. section 360bbb-3(b)(1), unless the authorization is  terminated or revoked. Performed at [facility], [address], [place], [place] [address]   MRSA PCR Screening     Status: None   Collection Time: 08/03/19  9:52 PM   Specimen: Nasal Mucosa; Nasopharyngeal  Result Value Ref Range Status   MRSA by PCR NEGATIVE NEGATIVE Final    Comment:        The GeneXpert MRSA Assay (FDA approved for NASAL specimens only), is one component of a comprehensive MRSA colonization surveillance program. It is not intended to diagnose MRSA infection nor to guide or monitor treatment for MRSA infections. Performed at [facility], [address], [place], [place] [address]      Scheduled Meds: . amiodarone  200 mg Oral Daily  . aspirin EC  81 mg Oral Daily  . Chlorhexidine Gluconate Cloth  6 each Topical Q0600  . Chlorhexidine Gluconate Cloth  6 each Topical Q0600  . darbepoetin (ARANESP) injection - DIALYSIS  100  mcg Intravenous Q Tue-HD  . febuxostat  40 mg Oral Daily  . feeding supplement (NEPRO CARB STEADY)  237 mL Oral BID BM  . feeding supplement (PRO-STAT SUGAR FREE 64)  30 mL Oral BID  . insulin aspart  0-6 Units Subcutaneous TID WC  . lidocaine  1 patch Transdermal Q24H  . mouth rinse  15 mL Mouth Rinse BID  . multivitamin  1 tablet Oral QHS  . multivitamin with minerals  1 tablet Oral Daily  . pantoprazole  40 mg Oral Daily  . polyethylene glycol  17 g Oral Daily  . senna-docusate  1 tablet Oral QHS  . sodium chloride flush  3 mL Intravenous Q12H  . thyroid  120 mg Oral Daily  . cholecalciferol  5,000 Units Oral Daily   Continuous Infusions: . sodium chloride    . sodium chloride    . sodium chloride    . heparin Stopped (08/12/19 3382)    Procedures/Studies: DG Ribs Unilateral W/Chest Right  Result Date:  08/01/2019 CLINICAL DATA:  Right posterior rib tenderness following a fall this morning. EXAM: RIGHT RIBS AND CHEST - 3+ VIEW COMPARISON:  Portable chest dated 05/19/2019 FINDINGS: Normal sized heart. Post CABG changes. Small amount of interval linear atelectasis or scarring at the lung bases. Stable left proximal humerus fixation hardware. Old, healed right rib fractures with no acute rib fractures or pneumothorax seen. Previously described T6 and T7 vertebral compression deformities, better visualized on the thoracic spine radiographs obtained at the same time. IMPRESSION: 1. No acute rib fractures seen. 2. Small amount of interval bibasilar linear atelectasis or scarring. 3. Separately described interval T6 and T7 vertebral compression deformities. Electronically Signed   By: Claudie Revering M.D.   On: 08/01/2019 13:45   DG Thoracic Spine 2 View  Result Date: 08/01/2019 CLINICAL DATA:  Midline back tenderness following a fall. Upper back pain. EXAM: THORACIC SPINE 2 VIEWS COMPARISON:  Chest radiographs dated 01/05/2019 FINDINGS: Site scratch the interval approximately 20% inferior endplate compression deformity of the T6 vertebral body and approximately 30% compression deformity of the T7 vertebral body. No significant bony retropulsion visualized. No well visualized acute fracture lines. Post CABG changes and previously demonstrated left humerus fixation hardware. Thoracolumbar spine degenerative changes. IMPRESSION: 1. Interval approximately 20% inferior endplate compression deformity of the T6 vertebral body and approximately 30% compression deformity of the T7 vertebral body. 2. Thoracolumbar spine degenerative changes. Electronically Signed   By: Claudie Revering M.D.   On: 08/01/2019 13:42   CT THORACIC SPINE WO CONTRAST  Result Date: 08/05/2019 CLINICAL DATA:  [place] on 08/01/2019.  Back pain. EXAM: CT THORACIC SPINE WITHOUT CONTRAST TECHNIQUE: Multidetector CT images of the thoracic were obtained using the  standard protocol without intravenous contrast. COMPARISON:  Radiography 08/01/2019.  CT chest 11/07/2018 2 FINDINGS: Alignment: Normal Vertebrae: The bones appear in general osteopenic. Widespread ankylosis of the spine. There do appear to be recent fractures at T6 and T7 with minimal loss of height. Fracture extends in an oblique fashion from the lower right vertebral body at T7, ascending upwards towards the left of the T7 vertebral body. The fracture involves the previously solid anterior osteophyte or syndesmophyte column. I can not show any involvement of the posterior elements, therefore it would be presumed stable. Paraspinal and other soft tissues: Bilateral pleural effusions layering dependently with dependent atelectasis, left more than right. Disc levels: No significant disc space pathology. No bony stenosis of the canal or foramina. 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 Oblique fracture through the T7 vertebral body. Minimal loss of height of T6 and T7, no more than 10-15%. No retropulsed bone. No evidence of extension of the fracture through the fused posterior elements. Electronically Signed   By: Nelson Chimes M.D.   On: 08/05/2019 19:44   MR THORACIC SPINE WO CONTRAST  Result Date: 08/06/2019 CLINICAL DATA:  Mid back pain, CT with fracture EXAM: MRI THORACIC SPINE WITHOUT CONTRAST TECHNIQUE: Multiplanar, multisequence MR imaging of the thoracic spine was performed. No intravenous contrast was administered. COMPARISON:  Correlation made with CT thoracic spine 08/05/2019 FINDINGS: Axial images do not localize to sagittal images. Alignment:  No significant listhesis. Vertebrae: Vertebral body numbering is based on the prior CT as artifact distorts the upper cervical spine on the scout sequence. There are fractures through the T6 and T7 vertebral bodies. There is less than 50% loss of vertebral body height and no  substantial osseous retropulsion. There is disruption of the likely calcified anterior longitudinal ligament as well as a bridging syndesmophyte are osteophyte at T7-T8. Posterior longitudinal ligament appears intact. Cord:  No abnormal signal. Paraspinal and other soft tissues: Probable prevertebral edema at T6-T8. Disc levels: Mild multilevel degenerative disc disease and facet arthropathy. There is no high-grade stenosis. IMPRESSION: Acute T7 vertebral body fracture with less than 50% loss of height. There is likely small focal disruption of the anterior longitudinal ligament at this level. Acute to subacute T6 vertebral body fracture with less than 50% loss of height. No significant osseous retropulsion. Electronically Signed   By: Macy Mis M.D.   On: 08/06/2019 14:35   DG Chest Portable 1 View  Result Date: 08/03/2019 CLINICAL DATA:  Altered level of consciousness, bradycardia EXAM: PORTABLE CHEST 1 VIEW COMPARISON:  08/01/2019 FINDINGS: Cardiomegaly. Prior CABG. Elevation of the left hemidiaphragm with left base atelectasis and small left effusion. No confluent opacity on the right. Mild vascular congestion. IMPRESSION: Mild cardiomegaly, vascular congestion. Elevation of the left hemidiaphragm with left base atelectasis and small left effusion. Electronically Signed   By: Rolm Baptise M.D.   On: 08/03/2019 11:58   ECHOCARDIOGRAM COMPLETE  Result Date: 08/04/2019    ECHOCARDIOGRAM REPORT   Patient Name:   SHAQUETA CASADY Date of Exam: 08/04/2019 Medical Rec #:  [id number]        Height:       64.0 in Accession #:    [id number]       Weight:       164.5 lb Date of Birth:  1941-02-16        BSA:          1.800 m Patient Age:    57 years         BP:           139/42 mmHg Patient Gender: F                HR:           68 bpm. Exam Location:  [facility] Procedure: 2D Echo Indications:    Atrial Fibrillation 427.31 / I48.91  History:        Patient has prior history of Echocardiogram examinations, most                  recent 11/10/2018. Arrythmias:Atrial Fibrillation; Risk                 Factors:Hypertension, Dyslipidemia and Diabetes. ESRD  Respiratory failure with hypoxia.  Sonographer:    Vikki Ports Turrentine Referring Phys: [place]  1. Left ventricular ejection fraction, by estimation, is 65 to 70%. The left ventricle has normal function. The left ventricle has no regional wall motion abnormalities. Left ventricular diastolic parameters are consistent with Grade II diastolic dysfunction (pseudonormalization).  2. Right ventricular systolic function is normal. The right ventricular size is normal. There is mildly elevated pulmonary artery systolic pressure. The estimated right ventricular systolic pressure is 44.0 mmHg.  3. Left atrial size was upper normal.  4. The mitral valve is degenerative. Mild mitral valve regurgitation.  5. The aortic valve is tricuspid. Aortic valve regurgitation is not visualized.  6. The inferior vena cava is normal in size with greater than 50% respiratory variability, suggesting right atrial pressure of 3 mmHg. FINDINGS  Left Ventricle: Left ventricular ejection fraction, by estimation, is 65 to 70%. The left ventricle has normal function. The left ventricle has no regional wall motion abnormalities. The left ventricular internal cavity size was normal in size. There is  no left ventricular hypertrophy. Left ventricular diastolic parameters are consistent with Grade II diastolic dysfunction (pseudonormalization). Right Ventricle: The right ventricular size is normal. No increase in right ventricular wall thickness. Right ventricular systolic function is normal. There is mildly elevated pulmonary artery systolic pressure. The tricuspid regurgitant velocity is 2.88  m/s, and with an assumed right atrial pressure of 3 mmHg, the estimated right ventricular systolic pressure is 10.2 mmHg. Left Atrium: Left atrial size was upper normal. Right Atrium: Right  atrial size was normal in size. Pericardium: There is no evidence of pericardial effusion. Mitral Valve: The mitral valve is degenerative in appearance. There is mild calcification of the mitral valve leaflet(s). Mild mitral annular calcification. Mild mitral valve regurgitation. Tricuspid Valve: The tricuspid valve is grossly normal. Tricuspid valve regurgitation is mild. Aortic Valve: The aortic valve is tricuspid. Aortic valve regurgitation is not visualized. Mild aortic valve annular calcification. Aortic valve mean gradient measures 7.0 mmHg. Aortic valve peak gradient measures 12.7 mmHg. Aortic valve area, by VTI measures 2.05 cm. Pulmonic Valve: The pulmonic valve was grossly normal. Pulmonic valve regurgitation is not visualized. Aorta: The aortic root is normal in size and structure. Venous: The inferior vena cava is normal in size with greater than 50% respiratory variability, suggesting right atrial pressure of 3 mmHg. IAS/Shunts: No atrial level shunt detected by color flow Doppler.  LEFT VENTRICLE PLAX 2D LVIDd:         4.98 cm  Diastology LVIDs:         3.44 cm  LV e' lateral:   14.00 cm/s LV PW:         0.74 cm  LV E/e' lateral: 9.3 LV IVS:        0.83 cm  LV e' medial:    4.57 cm/s LVOT diam:     1.80 cm  LV E/e' medial:  28.4 LV SV:         88 LV SV Index:   49 LVOT Area:     2.54 cm  RIGHT VENTRICLE RV S prime:     8.70 cm/s TAPSE (M-mode): 1.7 cm LEFT ATRIUM             Index       RIGHT ATRIUM           Index LA diam:        4.10 cm 2.28 cm/m  RA Area:  19.40 cm LA Vol (A2C):   42.7 ml 23.72 ml/m RA Volume:   56.80 ml  31.55 ml/m LA Vol (A4C):   58.8 ml 32.66 ml/m LA Biplane Vol: 53.3 ml 29.61 ml/m  AORTIC VALVE AV Area (Vmax):    1.72 cm AV Area (Vmean):   1.89 cm AV Area (VTI):     2.05 cm AV Vmax:           178.00 cm/s AV Vmean:          122.000 cm/s AV VTI:            0.431 m AV Peak Grad:      12.7 mmHg AV Mean Grad:      7.0 mmHg LVOT Vmax:         120.00 cm/s LVOT Vmean:         90.400 cm/s LVOT VTI:          0.347 m LVOT/AV VTI ratio: 0.81  AORTA Ao Root diam: 2.60 cm MITRAL VALVE                TRICUSPID VALVE MV Area (PHT): 6.54 cm     TR Peak grad:   33.2 mmHg MV Decel Time: 116 msec     TR Vmax:        288.00 cm/s MV E velocity: 130.00 cm/s MV A velocity: 111.00 cm/s  SHUNTS MV E/A ratio:  1.17         Systemic VTI:  0.35 m                             Systemic Diam: 1.80 cm Rozann Lesches MD Electronically signed by Rozann Lesches MD Signature Date/Time: 08/04/2019/10:07:37 AM    Final     Irwin Brakeman, MD  How to contact the [facility] Attending or Consulting provider [place] or covering provider during after hours [place], for this patient?  1. Check the care team in [facility] and look for a) attending/consulting [facility] provider listed and b) the [facility] team listed 2. Log into [email or web address] and use [place]'s universal password to access. If you do not have the password, please contact the hospital operator. 3. Locate the [facility] provider you are looking for under [facility] and page to a number that you can be directly reached. 4. If you still have difficulty reaching the provider, please page the [facility] (Director on Call) for the Hospitalists listed on amion for assistance.   If 7PM-7AM, please contact night-coverage [email or web address] Password [facility] 08/12/2019, 12:35 PM   LOS: 8 days

## 2019-08-13 LAB — RENAL FUNCTION PANEL
Albumin: 3 g/dL — ABNORMAL LOW (ref 3.5–5.0)
Anion gap: 12 (ref 5–15)
BUN: 30 mg/dL — ABNORMAL HIGH (ref 8–23)
CO2: 29 mmol/L (ref 22–32)
Calcium: 8.8 mg/dL — ABNORMAL LOW (ref 8.9–10.3)
Chloride: 95 mmol/L — ABNORMAL LOW (ref 98–111)
Creatinine, Ser: 3.25 mg/dL — ABNORMAL HIGH (ref 0.44–1.00)
GFR calc Af Amer: 15 mL/min — ABNORMAL LOW
GFR calc non Af Amer: 13 mL/min — ABNORMAL LOW
Glucose, Bld: 108 mg/dL — ABNORMAL HIGH (ref 70–99)
Phosphorus: 3.2 mg/dL (ref 2.5–4.6)
Potassium: 3.9 mmol/L (ref 3.5–5.1)
Sodium: 136 mmol/L (ref 135–145)

## 2019-08-13 LAB — GLUCOSE, CAPILLARY
Glucose-Capillary: 95 mg/dL (ref 70–99)
Glucose-Capillary: 130 mg/dL — ABNORMAL HIGH (ref 70–99)
Glucose-Capillary: 96 mg/dL (ref 70–99)
Glucose-Capillary: 158 mg/dL — ABNORMAL HIGH (ref 70–99)

## 2019-08-13 LAB — CBC
HCT: 30.4 % — ABNORMAL LOW (ref 36.0–46.0)
Hemoglobin: 9.2 g/dL — ABNORMAL LOW (ref 12.0–15.0)
MCH: 30.2 pg (ref 26.0–34.0)
MCHC: 30.3 g/dL (ref 30.0–36.0)
MCV: 99.7 fL (ref 80.0–100.0)
Platelets: 346 10*3/uL (ref 150–400)
RBC: 3.05 MIL/uL — ABNORMAL LOW (ref 3.87–5.11)
RDW: 17.1 % — ABNORMAL HIGH (ref 11.5–15.5)
WBC: 7.3 10*3/uL (ref 4.0–10.5)
nRBC: 0 % (ref 0.0–0.2)

## 2019-08-13 LAB — PROTIME-INR
INR: 1.3 — ABNORMAL HIGH (ref 0.8–1.2)
Prothrombin Time: 15.7 s — ABNORMAL HIGH (ref 11.4–15.2)

## 2019-08-13 LAB — MAGNESIUM: Magnesium: 2.2 mg/dL (ref 1.7–2.4)

## 2019-08-13 MED ORDER — WARFARIN SODIUM 5 MG PO TABS
5.0000 mg | ORAL_TABLET | Freq: Once | ORAL | Status: AC
  Administered 2019-08-13: 5 mg via ORAL
  Filled 2019-08-13: qty 1

## 2019-08-13 NOTE — Progress Notes (Signed)
Patient ID: ALOHA BARTOK, female   DOB: 02-10-1941, 79 y.o.   MRN: [id number]   IR Round note via phone RN Velna Hatchet  T6/T7 KP yesterday in IR  Site is c/d/i No bleeding Moving all 4s  Although pt is still complaining of much pain She has been able to sit up in chair for 4 hrs this morning per RN  We will follow few days

## 2019-08-13 NOTE — Progress Notes (Signed)
PROGRESS NOTE  [name] TML:[id number] DOB: 1940-08-02 DOA: 08/03/2019 PCP: Doree Albee, MD  Brief History: 79 year old female with a history of paroxysmal atrial fibrillation on warfarin, hypertension, diastolic CHF, ESRD(MWF), hyperlipidemia, hypothyroidism, coronary artery disease presenting with altered mental status. The patient was recently in the emergency department on 08/01/2019 after mechanical fall resulting in a T6-7 compression fracture. She was sent home in stable condition with hydrocodone. However, her caretaker and significant other found the patient with altered mental status and unresponsiveness. EMS was activated. The patient's mental status showed improvement after given Narcan. However, the patient was noted to have hyperkalemia with new oxygen requirement with bradycardia in the 30-40 range. Chest x-ray showed mild vascular congestion. The patient was given calcium gluconate and Lokelma in the emergency department with atropine. Cardiology and nephrology were consulted.  IR was consulted and kyphoplasty is planned 08/12/19.  Assessment/Plan: Acute toxic encephalopathy -Likely related to the patient's opioid medication--significant other was giving pt opioids q2-4 hour at home -Recently started on hydrocodone for her thoracic compression fractureas outpt -08/07/19--more confused, likely due to pt getting fentanyl AND hydrocodone -discontinue IV fentanyl and decrease gabapentin-->mental status improved -d/c gaba as significant other complains of confusion at bedtime-->improved  Thoracic spine compression fracture(T6-T7) -Unfortunately, patient is unable to sit up for transport or dialysis -CTthoracicspine--Oblique fracture through the T7 vertebral body. Minimal loss of height of T6 and T7, no more than 10-15%. No retropulsed bone. -Consult IR for possible kyphoplasty-->requesting MR thoracic spine to fully evaluate -Judicious opioid  treatment--start norco 5/325 -3/11 MR T-spine--acute T7 fx with <50% loss of height; Acute to subacute T6 fx with <50% loss of height -kyphoplasty 08/12/19 completed, working with PT for further ambulation   Transient junctional bradycardia -Appreciate cardiology consult -May have been partly attributable to her hyperkalemia in the setting of ESRD -Amiodarone restarted -observe off of metoprolol -no further bradycardia or pauses  Chronic diastolic CHF -Volume status managed by hemodialysis -08/04/2019 echo EF of 65-70%, grade 2 DD, mild elevated PASP, mild MR/TR  ESRD -Appreciate nephrology consult -Hemodialysis on 08/07/2019, planned again on 3/17  Paroxysmal atrial fibrillation -rate controlled -Appreciate cardiology -restarted warfarin after procedure on 3/17, no need to bridge with heparin -Restarted amiodarone -pharmD managing warfarin dosing, following PT/INR closely.   Hypothyroidism -Continue [place] thyroid  Essential hypertension -Holding metoprolol succinate and amlodipine secondary to soft blood pressure initially   Disposition Plan: Patient From:Home D/C Place:SNF when able to sit upright for transport and HD Barriers:inability to sit up for transport or HD;  Family Communication:Significant other updatedat bedside 08/08/19, attempted phone call unanswered 3/18  Consultants:Renal; cardiology  Code Status: FULL   DVT Prophylaxis:warfarin   Procedures: As Listed in Progress Note Above  Antibiotics: None  Subjective: Pt sitting up in chair today, complains of ongoing pain but much more mobile than prior to procedure.    Objective: Vitals:   08/12/19 2230 08/12/19 2305 08/13/19 0500 08/13/19 0740  BP: (!) 98/50 120/72    Pulse: 80 82    Resp: 18 18    Temp:  98 F (36.7 C)    TempSrc:  Axillary    SpO2:    99%  Weight:   73.4 kg   Height:        Intake/Output Summary (Last 24 hours) at 08/13/2019 1624 Last data  filed at 08/12/2019 2305 Gross per 24 hour  Intake 250 ml  Output 1200 ml  Net -950 ml  Weight change: 0 kg Exam:   General:  Lying flat in bed, NAD. Cooperative, somnolent.   HEENT: neck supple, [place]/AT  Cardiovascular: normal S1/S2, no rubs, no gallops  Respiratory: BBS CTA. No wheeze  Abdomen: Soft/+BS, non tender, non distended, no guarding  Extremities: No CCE.   Data Reviewed: I have personally reviewed following labs and imaging studies Basic Metabolic Panel: Recent Labs  Lab 08/07/19 1534 08/10/19 1201 08/10/19 1644 08/12/19 0738 08/13/19 0638  NA 132* 132* 130* 127* 136  K 4.4 4.4 3.5 4.5 3.9  CL 91* 90* 91* 88* 95*  CO2 27 30 28 27 29   GLUCOSE 268* 129* 149* 127* 108*  BUN 46* 77* 27* 56* 30*  CREATININE 6.06* 6.35* 2.88* 5.20* 3.25*  CALCIUM 8.5* 9.3 8.3* 8.9 8.8*  MG  --   --   --   --  2.2  PHOS 3.7 2.3* 1.2* 4.0 3.2   Liver Function Tests: Recent Labs  Lab 08/07/19 1534 08/10/19 1201 08/10/19 1644 08/12/19 0738 08/13/19 0638  ALBUMIN 2.7* 2.7* 2.8* 3.0* 3.0*   No results for input(s): LIPASE, AMYLASE in the last 168 hours. No results for input(s): AMMONIA in the last 168 hours. Coagulation Profile: Recent Labs  Lab 08/09/19 0727 08/10/19 0509 08/11/19 0522 08/12/19 0738 08/13/19 0638  INR 3.4* 3.6* 1.8* 1.4* 1.3*   CBC: Recent Labs  Lab 08/09/19 0727 08/10/19 0509 08/10/19 1201 08/12/19 0738 08/13/19 0638  WBC 6.6 7.3 8.9 7.1 7.3  HGB 9.2* 8.7* 8.5* 9.3* 9.2*  HCT 31.1* 28.7* 28.6* 30.6* 30.4*  MCV 101.0* 100.7* 101.4* 99.4 99.7  PLT 348 360 376 383 346   Cardiac Enzymes: No results for input(s): CKTOTAL, CKMB, CKMBINDEX, TROPONINI in the last 168 hours. BNP: Invalid input(s): POCBNP CBG: Recent Labs  Lab 08/12/19 1104 08/12/19 1618 08/12/19 2056 08/13/19 0730 08/13/19 1114  GLUCAP 127* 127* 108* 95 130*   HbA1C: No results for input(s): HGBA1C in the last 72 hours. Urine analysis:    Component Value  Date/Time   COLORURINE AMBER (A) 05/19/2019 0919   APPEARANCEUR HAZY (A) 05/19/2019 0919   LABSPEC 1.016 05/19/2019 0919   PHURINE 5.0 05/19/2019 0919   GLUCOSEU NEGATIVE 05/19/2019 0919   HGBUR NEGATIVE 05/19/2019 0919   BILIRUBINUR NEGATIVE 05/19/2019 0919   KETONESUR NEGATIVE 05/19/2019 0919   PROTEINUR 100 (A) 05/19/2019 0919   UROBILINOGEN 0.2 02/08/2015 1615   NITRITE NEGATIVE 05/19/2019 0919   LEUKOCYTESUR NEGATIVE 05/19/2019 0919    Recent Results (from the past 240 hour(s))  Respiratory Panel by RT PCR (Flu A&B, Covid) - Nasopharyngeal Swab     Status: None   Collection Time: 08/03/19  5:09 PM   Specimen: Nasopharyngeal Swab  Result Value Ref Range Status   SARS Coronavirus 2 by RT PCR NEGATIVE NEGATIVE Final    Comment: (NOTE) SARS-CoV-2 target nucleic acids are NOT DETECTED. The SARS-CoV-2 RNA is generally detectable in upper respiratoy specimens during the acute phase of infection. The lowest concentration of SARS-CoV-2 viral copies this assay can detect is 131 copies/mL. A negative result does not preclude SARS-Cov-2 infection and should not be used as the sole basis for treatment or other patient management decisions. A negative result may occur with  improper specimen collection/handling, submission of specimen other than nasopharyngeal swab, presence of viral mutation(s) within the areas targeted by this assay, and inadequate number of viral copies (<131 copies/mL). A negative result must be combined with clinical observations, patient history, and epidemiological information. The expected result is Negative.  Fact Sheet for Patients:  PinkCheek.be Fact Sheet for Healthcare Providers:  GravelBags.it This test is not yet ap proved or cleared by the [place] FDA and  has been authorized for detection and/or diagnosis of SARS-CoV-2 by FDA under an Emergency Use Authorization (EUA). This EUA will remain    in effect (meaning this test can be used) for the duration of the COVID-19 declaration under Section 564(b)(1) of the Act, 21 U.S.C. section 360bbb-3(b)(1), unless the authorization is terminated or revoked sooner.    Influenza A by PCR NEGATIVE NEGATIVE Final   Influenza B by PCR NEGATIVE NEGATIVE Final    Comment: (NOTE) The Xpert Xpress SARS-CoV-2/FLU/RSV assay is intended as an aid in  the diagnosis of influenza from Nasopharyngeal swab specimens and  should not be used as a sole basis for treatment. Nasal washings and  aspirates are unacceptable for Xpert Xpress SARS-CoV-2/FLU/RSV  testing. Fact Sheet for Patients: PinkCheek.be Fact Sheet for Healthcare Providers: GravelBags.it This test is not yet approved or cleared by the [place] FDA and  has been authorized for detection and/or diagnosis of SARS-CoV-2 by  FDA under an Emergency Use Authorization (EUA). This EUA will remain  in effect (meaning this test can be used) for the duration of the  Covid-19 declaration under Section 564(b)(1) of the Act, 21  U.S.C. section 360bbb-3(b)(1), unless the authorization is  terminated or revoked. Performed at [facility], [address], [place], [place] [address]   MRSA PCR Screening     Status: None   Collection Time: 08/03/19  9:52 PM   Specimen: Nasal Mucosa; Nasopharyngeal  Result Value Ref Range Status   MRSA by PCR NEGATIVE NEGATIVE Final    Comment:        The GeneXpert MRSA Assay (FDA approved for NASAL specimens only), is one component of a comprehensive MRSA colonization surveillance program. It is not intended to diagnose MRSA infection nor to guide or monitor treatment for MRSA infections. Performed at [facility], [address], [place], [place] [address]      Scheduled Meds: . amiodarone  200 mg Oral Daily  . aspirin EC  81 mg Oral Daily  . Chlorhexidine Gluconate Cloth  6 each Topical Q0600  .  Chlorhexidine Gluconate Cloth  6 each Topical Q0600  . darbepoetin (ARANESP) injection - DIALYSIS  100 mcg Intravenous Q Tue-HD  . febuxostat  40 mg Oral Daily  . feeding supplement (NEPRO CARB STEADY)  237 mL Oral BID BM  . feeding supplement (PRO-STAT SUGAR FREE 64)  30 mL Oral BID  . insulin aspart  0-6 Units Subcutaneous TID WC  . lidocaine  1 patch Transdermal Q24H  . mouth rinse  15 mL Mouth Rinse BID  . multivitamin  1 tablet Oral QHS  . multivitamin with minerals  1 tablet Oral Daily  . pantoprazole  40 mg Oral Daily  . polyethylene glycol  17 g Oral Daily  . senna-docusate  1 tablet Oral QHS  . sodium chloride flush  3 mL Intravenous Q12H  . thyroid  120 mg Oral Daily  . cholecalciferol  5,000 Units Oral Daily  . Warfarin - Pharmacist Dosing Inpatient   Does not apply q1800   Continuous Infusions: . sodium chloride    . sodium chloride    . sodium chloride      Procedures/Studies: DG Ribs Unilateral W/Chest Right  Result Date: 08/01/2019 CLINICAL DATA:  Right posterior rib tenderness following a fall this morning. EXAM: RIGHT RIBS AND CHEST -  3+ VIEW COMPARISON:  Portable chest dated 05/19/2019 FINDINGS: Normal sized heart. Post CABG changes. Small amount of interval linear atelectasis or scarring at the lung bases. Stable left proximal humerus fixation hardware. Old, healed right rib fractures with no acute rib fractures or pneumothorax seen. Previously described T6 and T7 vertebral compression deformities, better visualized on the thoracic spine radiographs obtained at the same time. IMPRESSION: 1. No acute rib fractures seen. 2. Small amount of interval bibasilar linear atelectasis or scarring. 3. Separately described interval T6 and T7 vertebral compression deformities. Electronically Signed   By: Claudie Revering M.D.   On: 08/01/2019 13:45   DG Thoracic Spine 2 View  Result Date: 08/01/2019 CLINICAL DATA:  Midline back tenderness following a fall. Upper back pain. EXAM:  THORACIC SPINE 2 VIEWS COMPARISON:  Chest radiographs dated 01/05/2019 FINDINGS: Site scratch the interval approximately 20% inferior endplate compression deformity of the T6 vertebral body and approximately 30% compression deformity of the T7 vertebral body. No significant bony retropulsion visualized. No well visualized acute fracture lines. Post CABG changes and previously demonstrated left humerus fixation hardware. Thoracolumbar spine degenerative changes. IMPRESSION: 1. Interval approximately 20% inferior endplate compression deformity of the T6 vertebral body and approximately 30% compression deformity of the T7 vertebral body. 2. Thoracolumbar spine degenerative changes. Electronically Signed   By: Claudie Revering M.D.   On: 08/01/2019 13:42   CT THORACIC SPINE WO CONTRAST  Result Date: 08/05/2019 CLINICAL DATA:  [place] on 08/01/2019.  Back pain. EXAM: CT THORACIC SPINE WITHOUT CONTRAST TECHNIQUE: Multidetector CT images of the thoracic were obtained using the standard protocol without intravenous contrast. COMPARISON:  Radiography 08/01/2019.  CT chest 11/07/2018 2 FINDINGS: Alignment: Normal Vertebrae: The bones appear in general osteopenic. Widespread ankylosis of the spine. There do appear to be recent fractures at T6 and T7 with minimal loss of height. Fracture extends in an oblique fashion from the lower right vertebral body at T7, ascending upwards towards the left of the T7 vertebral body. The fracture involves the previously solid anterior osteophyte or syndesmophyte column. I can not show any involvement of the posterior elements, therefore it would be presumed stable. Paraspinal and other soft tissues: Bilateral pleural effusions layering dependently with dependent atelectasis, left more than right. Disc levels: No significant disc space pathology. No bony stenosis of the canal or foramina. 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 Oblique fracture through the T7 vertebral body. Minimal loss of height of T6 and T7, no more than 10-15%. No retropulsed bone. No evidence of extension of the fracture through the fused posterior elements. Electronically Signed   By: Nelson Chimes M.D.   On: 08/05/2019 19:44   MR THORACIC SPINE WO CONTRAST  Result Date: 08/06/2019 CLINICAL DATA:  Mid back pain, CT with fracture EXAM: MRI THORACIC SPINE WITHOUT CONTRAST TECHNIQUE: Multiplanar, multisequence MR imaging of the thoracic spine was performed. No intravenous contrast was administered. COMPARISON:  Correlation made with CT thoracic spine 08/05/2019 FINDINGS: Axial images do not localize to sagittal images. Alignment:  No significant listhesis. Vertebrae: Vertebral body numbering is based on the prior CT as artifact distorts the upper cervical spine on the scout sequence. There are fractures through the T6 and T7 vertebral bodies. There is less than 50% loss of vertebral body height and no substantial osseous retropulsion. There is disruption of the likely calcified anterior longitudinal ligament as well as a bridging syndesmophyte are osteophyte at T7-T8. Posterior longitudinal ligament appears intact.  Cord:  No abnormal signal. Paraspinal and other soft tissues: Probable prevertebral edema at T6-T8. Disc levels: Mild multilevel degenerative disc disease and facet arthropathy. There is no high-grade stenosis. IMPRESSION: Acute T7 vertebral body fracture with less than 50% loss of height. There is likely small focal disruption of the anterior longitudinal ligament at this level. Acute to subacute T6 vertebral body fracture with less than 50% loss of height. No significant osseous retropulsion. Electronically Signed   By: Macy Mis M.D.   On: 08/06/2019 14:35   DG Chest Portable 1 View  Result Date: 08/03/2019 CLINICAL DATA:  Altered level of consciousness, bradycardia EXAM: PORTABLE CHEST 1 VIEW COMPARISON:  08/01/2019 FINDINGS:  Cardiomegaly. Prior CABG. Elevation of the left hemidiaphragm with left base atelectasis and small left effusion. No confluent opacity on the right. Mild vascular congestion. IMPRESSION: Mild cardiomegaly, vascular congestion. Elevation of the left hemidiaphragm with left base atelectasis and small left effusion. Electronically Signed   By: Rolm Baptise M.D.   On: 08/03/2019 11:58   ECHOCARDIOGRAM COMPLETE  Result Date: 08/04/2019    ECHOCARDIOGRAM REPORT   Patient Name:   AZUSENA ERLANDSON Date of Exam: 08/04/2019 Medical Rec #:  [id number]        Height:       64.0 in Accession #:    [id number]       Weight:       164.5 lb Date of Birth:  1940-12-29        BSA:          1.800 m Patient Age:    31 years         BP:           139/42 mmHg Patient Gender: F                HR:           68 bpm. Exam Location:  [facility] Procedure: 2D Echo Indications:    Atrial Fibrillation 427.31 / I48.91  History:        Patient has prior history of Echocardiogram examinations, most                 recent 11/10/2018. Arrythmias:Atrial Fibrillation; Risk                 Factors:Hypertension, Dyslipidemia and Diabetes. ESRD                 Respiratory failure with hypoxia.  Sonographer:    Vikki Ports Turrentine Referring Phys: [place]  1. Left ventricular ejection fraction, by estimation, is 65 to 70%. The left ventricle has normal function. The left ventricle has no regional wall motion abnormalities. Left ventricular diastolic parameters are consistent with Grade II diastolic dysfunction (pseudonormalization).  2. Right ventricular systolic function is normal. The right ventricular size is normal. There is mildly elevated pulmonary artery systolic pressure. The estimated right ventricular systolic pressure is 68.1 mmHg.  3. Left atrial size was upper normal.  4. The mitral valve is degenerative. Mild mitral valve regurgitation.  5. The aortic valve is tricuspid. Aortic valve regurgitation is not visualized.  6. The  inferior vena cava is normal in size with greater than 50% respiratory variability, suggesting right atrial pressure of 3 mmHg. FINDINGS  Left Ventricle: Left ventricular ejection fraction, by estimation, is 65 to 70%. The left ventricle has normal function. The left ventricle has no regional wall motion abnormalities. The left ventricular internal cavity size was normal in size.  There is  no left ventricular hypertrophy. Left ventricular diastolic parameters are consistent with Grade II diastolic dysfunction (pseudonormalization). Right Ventricle: The right ventricular size is normal. No increase in right ventricular wall thickness. Right ventricular systolic function is normal. There is mildly elevated pulmonary artery systolic pressure. The tricuspid regurgitant velocity is 2.88  m/s, and with an assumed right atrial pressure of 3 mmHg, the estimated right ventricular systolic pressure is 27.0 mmHg. Left Atrium: Left atrial size was upper normal. Right Atrium: Right atrial size was normal in size. Pericardium: There is no evidence of pericardial effusion. Mitral Valve: The mitral valve is degenerative in appearance. There is mild calcification of the mitral valve leaflet(s). Mild mitral annular calcification. Mild mitral valve regurgitation. Tricuspid Valve: The tricuspid valve is grossly normal. Tricuspid valve regurgitation is mild. Aortic Valve: The aortic valve is tricuspid. Aortic valve regurgitation is not visualized. Mild aortic valve annular calcification. Aortic valve mean gradient measures 7.0 mmHg. Aortic valve peak gradient measures 12.7 mmHg. Aortic valve area, by VTI measures 2.05 cm. Pulmonic Valve: The pulmonic valve was grossly normal. Pulmonic valve regurgitation is not visualized. Aorta: The aortic root is normal in size and structure. Venous: The inferior vena cava is normal in size with greater than 50% respiratory variability, suggesting right atrial pressure of 3 mmHg. IAS/Shunts: No  atrial level shunt detected by color flow Doppler.  LEFT VENTRICLE PLAX 2D LVIDd:         4.98 cm  Diastology LVIDs:         3.44 cm  LV e' lateral:   14.00 cm/s LV PW:         0.74 cm  LV E/e' lateral: 9.3 LV IVS:        0.83 cm  LV e' medial:    4.57 cm/s LVOT diam:     1.80 cm  LV E/e' medial:  28.4 LV SV:         88 LV SV Index:   49 LVOT Area:     2.54 cm  RIGHT VENTRICLE RV S prime:     8.70 cm/s TAPSE (M-mode): 1.7 cm LEFT ATRIUM             Index       RIGHT ATRIUM           Index LA diam:        4.10 cm 2.28 cm/m  RA Area:     19.40 cm LA Vol (A2C):   42.7 ml 23.72 ml/m RA Volume:   56.80 ml  31.55 ml/m LA Vol (A4C):   58.8 ml 32.66 ml/m LA Biplane Vol: 53.3 ml 29.61 ml/m  AORTIC VALVE AV Area (Vmax):    1.72 cm AV Area (Vmean):   1.89 cm AV Area (VTI):     2.05 cm AV Vmax:           178.00 cm/s AV Vmean:          122.000 cm/s AV VTI:            0.431 m AV Peak Grad:      12.7 mmHg AV Mean Grad:      7.0 mmHg LVOT Vmax:         120.00 cm/s LVOT Vmean:        90.400 cm/s LVOT VTI:          0.347 m LVOT/AV VTI ratio: 0.81  AORTA Ao Root diam: 2.60 cm MITRAL VALVE  TRICUSPID VALVE MV Area (PHT): 6.54 cm     TR Peak grad:   33.2 mmHg MV Decel Time: 116 msec     TR Vmax:        288.00 cm/s MV E velocity: 130.00 cm/s MV A velocity: 111.00 cm/s  SHUNTS MV E/A ratio:  1.17         Systemic VTI:  0.35 m                             Systemic Diam: 1.80 cm Rozann Lesches MD Electronically signed by Rozann Lesches MD Signature Date/Time: 08/04/2019/10:07:37 AM    Final     Irwin Brakeman, MD  How to contact the [facility] Attending or Consulting provider [place] or covering provider during after hours [place], for this patient?  1. Check the care team in [facility] and look for a) attending/consulting [facility] provider listed and b) the [facility] team listed 2. Log into [email or web address] and use 's universal password to access. If you do not have the password, please contact the hospital  operator. 3. Locate the [facility] provider you are looking for under [facility] and page to a number that you can be directly reached. 4. If you still have difficulty reaching the provider, please page the [facility] (Director on Call) for the Hospitalists listed on amion for assistance.   If 7PM-7AM, please contact night-coverage [email or web address] Password [email or web address] 08/13/2019, 4:24 PM   LOS: 9 days

## 2019-08-13 NOTE — Evaluation (Signed)
Physical Therapy Evaluation Patient Details Name: [name] MRN: [id number] DOB: Dec 25, 1940 Today's Date: 08/13/2019   History of Present Illness  [name] is a 79 y.o. female s/p T6,T7 Kyphoplasty 08/12/19 with PMH significant for type 2 diabetes with nephropathy, A, fib (on coumadin), HTN, chronic diastolic HF, GERD, ESRD (M-W-F schedule), mixed HLD, hypothyroidism and recent ED visit after mechanical fall with findings of T6-T7 compression fracture (08/01/19); who was brought to ED by EMS secondary to episode of unresponsiveness and AMS. Patient with partial resolution to her presenting symptoms after Narcan given by EMS, but found with hyperkalemia, new requirement of oxygen supplementation and significantly bradycardic. Patient denies CP, fever, chills, hematochezia, melena, abd pain, blurred vision, skipping beat sensations or focal weakness.    Clinical Impression  Patient demonstrates fair/good return for rolling to side and sitting up from side lying position with use of bed rail, limited to taking a few steps at bedside mostly due to c/o severe upper back pain even after receiving pain medication earlier in AM and tolerated sitting up in chair with legs elevated and back in reclined position - RN/NT aware.  Patient will benefit from continued physical therapy in hospital and recommended venue below to increase strength, balance, endurance for safe ADLs and gait.    Follow Up Recommendations SNF    Equipment Recommendations  None recommended by PT    Recommendations for Other Services       Precautions / Restrictions Precautions Precautions: Fall Restrictions Weight Bearing Restrictions: No      Mobility  Bed Mobility Overal bed mobility: Needs Assistance Bed Mobility: Rolling;Sidelying to Sit Rolling: Min guard Sidelying to sit: Min assist;Mod assist       General bed mobility comments: bed flat, had to use bedrail, slow labored  movement  Transfers Overall transfer level: Needs assistance Equipment used: Rolling walker (2 wheeled) Transfers: Sit to/from [facility] Sit to Stand: Min assist Stand pivot transfers: Min assist       General transfer comment: increased time, labored movement  Ambulation/Gait Ambulation/Gait assistance: Min assist;Mod assist Gait Distance (Feet): 4 Feet Assistive device: Rolling walker (2 wheeled) Gait Pattern/deviations: Decreased step length - right;Decreased step length - left;Decreased stride length Gait velocity: decreased   General Gait Details: limited to 4-5 slow labored steps at bedside due to c/o increasing upper back pain with movement  Stairs            Wheelchair Mobility    Modified Rankin (Stroke Patients Only)       Balance Overall balance assessment: Needs assistance Sitting-balance support: Feet supported;Bilateral upper extremity supported Sitting balance-Leahy Scale: Fair Sitting balance - Comments: seated at EOB   Standing balance support: During functional activity;Bilateral upper extremity supported Standing balance-Leahy Scale: Fair Standing balance comment: using RW                             Pertinent Vitals/Pain Pain Assessment: Faces Faces Pain Scale: Hurts even more Pain Location: upper back Pain Descriptors / Indicators: Aching;Sore Pain Intervention(s): Limited activity within patient's tolerance;Monitored during session;Premedicated before session;Repositioned    Home Living Family/patient expects to be discharged to:: Private residence Living Arrangements: Non-relatives/Friends Available Help at Discharge: Friend(s);Available 24 hours/day Type of Home: House Home Access: Stairs to enter;Elevator Entrance Stairs-Rails: Right;Left Entrance Stairs-Number of Steps: 5 back, 7 front Home Layout: One level Home Equipment: Walker - 2 wheels;Tub bench;Toilet riser;Bedside commode;Cane - single  point  Prior Function Level of Independence: Independent with assistive device(s)         Comments: patient states community ambulator with RW     Hand Dominance   Dominant Hand: Right    Extremity/Trunk Assessment   Upper Extremity Assessment Upper Extremity Assessment: Generalized weakness    Lower Extremity Assessment Lower Extremity Assessment: Generalized weakness    Cervical / Trunk Assessment Cervical / Trunk Assessment: Kyphotic  Communication   Communication: No difficulties  Cognition Arousal/Alertness: Awake/alert Behavior During Therapy: WFL for tasks assessed/performed Overall Cognitive Status: Within Functional Limits for tasks assessed                                        General Comments      Exercises     Assessment/Plan    PT Assessment Patient needs continued PT services  PT Problem List Decreased strength;Decreased range of motion;Decreased activity tolerance;Decreased balance;Decreased mobility;Pain       PT Treatment Interventions DME instruction;Balance training;Gait training;Neuromuscular re-education;Stair training;Functional mobility training;Patient/family education;Therapeutic activities;Therapeutic exercise;Modalities    PT Goals (Current goals can be found in the Care Plan section)  Acute Rehab PT Goals Patient Stated Goal: return home PT Goal Formulation: With patient Time For Goal Achievement: 08/18/19 Potential to Achieve Goals: Good    Frequency Min 3X/week   Barriers to discharge        Co-evaluation               AM-PAC PT "6 Clicks" Mobility  Outcome Measure Help needed turning from your back to your side while in a flat bed without using bedrails?: A Little Help needed moving from lying on your back to sitting on the side of a flat bed without using bedrails?: A Lot Help needed moving to and from a bed to a chair (including a wheelchair)?: A Lot Help needed standing up from a chair  using your arms (e.g., wheelchair or bedside chair)?: A Little Help needed to walk in hospital room?: A Lot Help needed climbing 3-5 steps with a railing? : A Lot 6 Click Score: 14    End of Session   Activity Tolerance: Patient tolerated treatment well;Patient limited by fatigue;Patient limited by pain Patient left: in chair;with call bell/phone within reach;with chair alarm set Nurse Communication: Mobility status;Patient requests pain meds PT Visit Diagnosis: Unsteadiness on feet (R26.81);Other abnormalities of gait and mobility (R26.89);Muscle weakness (generalized) (M62.81)    Time: 1219-7588 PT Time Calculation (min) (ACUTE ONLY): 28 min   Charges:   PT Evaluation $PT Eval Moderate Complexity: 1 Mod PT Treatments $Therapeutic Activity: 23-37 mins        8:54 AM, 08/13/19 Lonell Grandchild, MPT Physical Therapist with [facility] [phone] office [phone] mobile phone

## 2019-08-13 NOTE — Progress Notes (Signed)
ANTICOAGULATION CONSULT NOTE - Follow Up Consult  Pharmacy Consult for warfarin Indication: atrial fibrillation   Allergies  Allergen Reactions  . Insulin Glargine Swelling    "Makes me swell like a balloon all over", including face, but without any respiratory distress or rashes. Associated with weight gain.  . Statins Other (See Comments)    "I've tried them all; my muscle aches were so bad I couldn't walk".    Patient Measurements: Height: 5\' 4"  (162.6 cm) Weight: 161 lb 13.1 oz (73.4 kg) IBW/kg (Calculated) : 54.7 HEPARIN DW (KG): 69.7  Vital Signs: Temp: 98.7 F (37.1 C) (03/18 0641) Temp Source: Oral (03/18 0641) BP: 146/37 (03/18 0641) Pulse Rate: 77 (03/18 0641)  Labs: Recent Labs    08/10/19 1201 08/10/19 1201 08/10/19 1644 08/11/19 0522 08/11/19 1654 08/12/19 0738 08/13/19 0638  HGB 8.5*   < >  --   --   --  9.3* 9.2*  HCT 28.6*  --   --   --   --  30.6* 30.4*  PLT 376  --   --   --   --  383 346  LABPROT  --   --   --  21.2*  --  17.1* 15.7*  INR  --   --   --  1.8*  --  1.4* 1.3*  HEPARINUNFRC  --   --   --   --  0.39 0.68  --   CREATININE 6.35*   < > 2.88*  --   --  5.20* 3.25*   < > = values in this interval not displayed.    Estimated Creatinine Clearance: 13.8 mL/min (A) (by C-G formula based on SCr of 3.25 mg/dL (H)).  Assessment: Patient with afib and on chronic warfarin completed kyphoplasty 3/17. Restart warfarin post procedure with current INR 1.4.  Vitamin K 2.5 mg PO given 3/15 and 3/16    Patient on chronic amiodarone 200 mg daily  Home dose listed as 5 mg Tue/Sat and 7.5 mg ROW. Patient supratherapeutic during admission so will be cautious with dosing.  Goal of Therapy:  INR 2-3 Monitor platelets by anticoagulation protocol: Yes   Plan:  Warfarin 5 mg x 1 dose. Monitor daily INR and s/s of bleeding.   Thank you for involving pharmacy in this patient's care.  Margot Ables, PharmD Clinical Pharmacist 08/13/2019 9:35  AM

## 2019-08-13 NOTE — Progress Notes (Signed)
Patient ID: [name], female   DOB: 02/10/1941, 79 y.o.   MRN: [id number] S: s/p kyphoplasty yesterday and was able to sit up in her chair for 4 hours and work with PT but is complaining of pain. O:BP 120/72 (BP Location: Left Arm)   Pulse 82   Temp 98 F (36.7 C) (Axillary)   Resp 18   Ht 5\' 4"  (1.626 m)   Wt 73.4 kg   SpO2 99%   BMI 27.78 kg/m   Intake/Output Summary (Last 24 hours) at 08/13/2019 1212 Last data filed at 08/12/2019 2305 Gross per 24 hour  Intake 250 ml  Output 1200 ml  Net -950 ml   Intake/Output: I/O last 3 completed shifts: In: 432.5 [P.O.:250; I.V.:182.5] Out: 1800 [Urine:600; Other:1200]  Intake/Output this shift:  No intake/output data recorded. Weight change: 0 kg Gen: frail, elderly WF in mild discomfort but NAD CVS: no rub Resp: cta Abd:+BS, soft, NT Ext: no edema, RUE AVF +T/B  Recent Labs  Lab 08/07/19 1534 08/10/19 1201 08/10/19 1644 08/12/19 0738 08/13/19 0638  NA 132* 132* 130* 127* 136  K 4.4 4.4 3.5 4.5 3.9  CL 91* 90* 91* 88* 95*  CO2 27 30 28 27 29   GLUCOSE 268* 129* 149* 127* 108*  BUN 46* 77* 27* 56* 30*  CREATININE 6.06* 6.35* 2.88* 5.20* 3.25*  ALBUMIN 2.7* 2.7* 2.8* 3.0* 3.0*  CALCIUM 8.5* 9.3 8.3* 8.9 8.8*  PHOS 3.7 2.3* 1.2* 4.0 3.2   Liver Function Tests: Recent Labs  Lab 08/10/19 1644 08/12/19 0738 08/13/19 0638  ALBUMIN 2.8* 3.0* 3.0*   No results for input(s): LIPASE, AMYLASE in the last 168 hours. No results for input(s): AMMONIA in the last 168 hours. CBC: Recent Labs  Lab 08/09/19 0727 08/09/19 0727 08/10/19 0509 08/10/19 0509 08/10/19 1201 08/12/19 0738 08/13/19 0638  WBC 6.6   < > 7.3   < > 8.9 7.1 7.3  HGB 9.2*   < > 8.7*   < > 8.5* 9.3* 9.2*  HCT 31.1*   < > 28.7*   < > 28.6* 30.6* 30.4*  MCV 101.0*  --  100.7*  --  101.4* 99.4 99.7  PLT 348   < > 360   < > 376 383 346   < > = values in this interval not displayed.   Cardiac Enzymes: No results for input(s): CKTOTAL, CKMB,  CKMBINDEX, TROPONINI in the last 168 hours. CBG: Recent Labs  Lab 08/12/19 1104 08/12/19 1618 08/12/19 2056 08/13/19 0730 08/13/19 1114  GLUCAP 127* 127* 108* 95 130*    Iron Studies: No results for input(s): IRON, TIBC, TRANSFERRIN, FERRITIN in the last 72 hours. Studies/Results: No results found. Marland Kitchen amiodarone  200 mg Oral Daily  . aspirin EC  81 mg Oral Daily  . Chlorhexidine Gluconate Cloth  6 each Topical Q0600  . Chlorhexidine Gluconate Cloth  6 each Topical Q0600  . darbepoetin (ARANESP) injection - DIALYSIS  100 mcg Intravenous Q Tue-HD  . febuxostat  40 mg Oral Daily  . feeding supplement (NEPRO CARB STEADY)  237 mL Oral BID BM  . feeding supplement (PRO-STAT SUGAR FREE 64)  30 mL Oral BID  . insulin aspart  0-6 Units Subcutaneous TID WC  . lidocaine  1 patch Transdermal Q24H  . mouth rinse  15 mL Mouth Rinse BID  . multivitamin  1 tablet Oral QHS  . multivitamin with minerals  1 tablet Oral Daily  . pantoprazole  40 mg Oral Daily  .  polyethylene glycol  17 g Oral Daily  . senna-docusate  1 tablet Oral QHS  . sodium chloride flush  3 mL Intravenous Q12H  . thyroid  120 mg Oral Daily  . cholecalciferol  5,000 Units Oral Daily  . warfarin  5 mg Oral Once  . Warfarin - Pharmacist Dosing Inpatient   Does not apply q1800    BMET    Component Value Date/Time   NA 136 08/13/2019 0638   K 3.9 08/13/2019 0638   CL 95 (L) 08/13/2019 0638   CO2 29 08/13/2019 0638   GLUCOSE 108 (H) 08/13/2019 0638   BUN 30 (H) 08/13/2019 0638   CREATININE 3.25 (H) 08/13/2019 0638   CREATININE 6.21 (H) 02/09/2019 1151   CALCIUM 8.8 (L) 08/13/2019 0638   CALCIUM 8.6 (L) 03/28/2018 1207   GFRNONAA 13 (L) 08/13/2019 0638   GFRNONAA 6 (L) 02/09/2019 1151   GFRAA 15 (L) 08/13/2019 0638   GFRAA 7 (L) 02/09/2019 1151   CBC    Component Value Date/Time   WBC 7.3 08/13/2019 0638   RBC 3.05 (L) 08/13/2019 0638   HGB 9.2 (L) 08/13/2019 0638   HCT 30.4 (L) 08/13/2019 0638   PLT 346  08/13/2019 0638   MCV 99.7 08/13/2019 0638   MCH 30.2 08/13/2019 0638   MCHC 30.3 08/13/2019 0638   RDW 17.1 (H) 08/13/2019 0638   LYMPHSABS 0.5 (L) 08/03/2019 1120   MONOABS 0.5 08/03/2019 1120   EOSABS 0.0 08/03/2019 1120   BASOSABS 0.0 08/03/2019 1120   Dialysis Orders: Center: [name] on MWF . EDW 77kg HD Bath 2K/2.25Ca  Time 4 hours Heparin none. Access RUE AVF BFR 300 DFR 600    Epogen 7600  Units IV/HD  Venofer  50 mg IV qweek   Assessment/Plan: 1. AMS- presumably due to opioids, improved since admission off of fentanyl and gabapenin 2. Thoracic spine commpression fracture T6-7.  s/p kyphoplasty per IR 08/12/19 with improved mobility but some discomfort. Working with PT. 3. ESRD continue with HD on MWF schedule 4. Anemia: cont with ESA 5. CKD-MBD: stable cont with meds 6. Nutrition: renal diet 7. Hypertension: stable 8. A fib with bradycardia- per cardiology 9. Disposition- will need to be able to tolerate sitting in a chair for prolonged periods of time to allow for transfer to and from HD.  Donetta Potts, MD [employer] [phone]

## 2019-08-14 DIAGNOSIS — N250 Renal osteodystrophy: Secondary | ICD-10-CM | POA: Diagnosis not present

## 2019-08-14 DIAGNOSIS — M5489 Other dorsalgia: Secondary | ICD-10-CM | POA: Diagnosis not present

## 2019-08-14 DIAGNOSIS — E872 Acidosis: Secondary | ICD-10-CM | POA: Diagnosis not present

## 2019-08-14 DIAGNOSIS — T886XXA Anaphylactic reaction due to adverse effect of correct drug or medicament properly administered, initial encounter: Secondary | ICD-10-CM | POA: Diagnosis not present

## 2019-08-14 DIAGNOSIS — E118 Type 2 diabetes mellitus with unspecified complications: Secondary | ICD-10-CM | POA: Diagnosis not present

## 2019-08-14 DIAGNOSIS — Z992 Dependence on renal dialysis: Secondary | ICD-10-CM | POA: Diagnosis not present

## 2019-08-14 DIAGNOSIS — I10 Essential (primary) hypertension: Secondary | ICD-10-CM | POA: Diagnosis not present

## 2019-08-14 DIAGNOSIS — E875 Hyperkalemia: Secondary | ICD-10-CM | POA: Diagnosis not present

## 2019-08-14 DIAGNOSIS — R569 Unspecified convulsions: Secondary | ICD-10-CM | POA: Diagnosis not present

## 2019-08-14 DIAGNOSIS — R0689 Other abnormalities of breathing: Secondary | ICD-10-CM | POA: Diagnosis not present

## 2019-08-14 DIAGNOSIS — E1121 Type 2 diabetes mellitus with diabetic nephropathy: Secondary | ICD-10-CM | POA: Diagnosis not present

## 2019-08-14 DIAGNOSIS — R5381 Other malaise: Secondary | ICD-10-CM | POA: Diagnosis not present

## 2019-08-14 DIAGNOSIS — Z951 Presence of aortocoronary bypass graft: Secondary | ICD-10-CM | POA: Diagnosis not present

## 2019-08-14 DIAGNOSIS — E782 Mixed hyperlipidemia: Secondary | ICD-10-CM | POA: Diagnosis not present

## 2019-08-14 DIAGNOSIS — S22000G Wedge compression fracture of unspecified thoracic vertebra, subsequent encounter for fracture with delayed healing: Secondary | ICD-10-CM | POA: Diagnosis not present

## 2019-08-14 DIAGNOSIS — G40911 Epilepsy, unspecified, intractable, with status epilepticus: Secondary | ICD-10-CM | POA: Diagnosis not present

## 2019-08-14 DIAGNOSIS — I480 Paroxysmal atrial fibrillation: Secondary | ICD-10-CM | POA: Diagnosis not present

## 2019-08-14 DIAGNOSIS — G40901 Epilepsy, unspecified, not intractable, with status epilepticus: Secondary | ICD-10-CM | POA: Diagnosis not present

## 2019-08-14 DIAGNOSIS — M6281 Muscle weakness (generalized): Secondary | ICD-10-CM | POA: Diagnosis not present

## 2019-08-14 DIAGNOSIS — G8929 Other chronic pain: Secondary | ICD-10-CM | POA: Diagnosis not present

## 2019-08-14 DIAGNOSIS — Z7901 Long term (current) use of anticoagulants: Secondary | ICD-10-CM | POA: Diagnosis not present

## 2019-08-14 DIAGNOSIS — E785 Hyperlipidemia, unspecified: Secondary | ICD-10-CM | POA: Diagnosis not present

## 2019-08-14 DIAGNOSIS — J9601 Acute respiratory failure with hypoxia: Secondary | ICD-10-CM

## 2019-08-14 DIAGNOSIS — E871 Hypo-osmolality and hyponatremia: Secondary | ICD-10-CM | POA: Diagnosis not present

## 2019-08-14 DIAGNOSIS — I6783 Posterior reversible encephalopathy syndrome: Secondary | ICD-10-CM | POA: Diagnosis not present

## 2019-08-14 DIAGNOSIS — G40801 Other epilepsy, not intractable, with status epilepticus: Secondary | ICD-10-CM | POA: Diagnosis not present

## 2019-08-14 DIAGNOSIS — N186 End stage renal disease: Secondary | ICD-10-CM | POA: Diagnosis not present

## 2019-08-14 DIAGNOSIS — S22060D Wedge compression fracture of T7-T8 vertebra, subsequent encounter for fracture with routine healing: Secondary | ICD-10-CM | POA: Diagnosis not present

## 2019-08-14 DIAGNOSIS — R278 Other lack of coordination: Secondary | ICD-10-CM | POA: Diagnosis not present

## 2019-08-14 DIAGNOSIS — R4189 Other symptoms and signs involving cognitive functions and awareness: Secondary | ICD-10-CM | POA: Diagnosis not present

## 2019-08-14 DIAGNOSIS — R1311 Dysphagia, oral phase: Secondary | ICD-10-CM | POA: Diagnosis not present

## 2019-08-14 DIAGNOSIS — R400 Somnolence: Secondary | ICD-10-CM | POA: Diagnosis not present

## 2019-08-14 DIAGNOSIS — M545 Low back pain: Secondary | ICD-10-CM | POA: Diagnosis not present

## 2019-08-14 DIAGNOSIS — I2510 Atherosclerotic heart disease of native coronary artery without angina pectoris: Secondary | ICD-10-CM | POA: Diagnosis not present

## 2019-08-14 DIAGNOSIS — G92 Toxic encephalopathy: Secondary | ICD-10-CM | POA: Diagnosis not present

## 2019-08-14 DIAGNOSIS — R52 Pain, unspecified: Secondary | ICD-10-CM | POA: Diagnosis not present

## 2019-08-14 DIAGNOSIS — I132 Hypertensive heart and chronic kidney disease with heart failure and with stage 5 chronic kidney disease, or end stage renal disease: Secondary | ICD-10-CM | POA: Diagnosis not present

## 2019-08-14 DIAGNOSIS — D649 Anemia, unspecified: Secondary | ICD-10-CM | POA: Diagnosis not present

## 2019-08-14 DIAGNOSIS — R001 Bradycardia, unspecified: Secondary | ICD-10-CM | POA: Diagnosis not present

## 2019-08-14 DIAGNOSIS — I482 Chronic atrial fibrillation, unspecified: Secondary | ICD-10-CM | POA: Diagnosis not present

## 2019-08-14 DIAGNOSIS — I5030 Unspecified diastolic (congestive) heart failure: Secondary | ICD-10-CM | POA: Diagnosis not present

## 2019-08-14 DIAGNOSIS — R4020 Unspecified coma: Secondary | ICD-10-CM | POA: Diagnosis not present

## 2019-08-14 DIAGNOSIS — I4891 Unspecified atrial fibrillation: Secondary | ICD-10-CM

## 2019-08-14 DIAGNOSIS — I611 Nontraumatic intracerebral hemorrhage in hemisphere, cortical: Secondary | ICD-10-CM | POA: Diagnosis not present

## 2019-08-14 DIAGNOSIS — Z4682 Encounter for fitting and adjustment of non-vascular catheter: Secondary | ICD-10-CM | POA: Diagnosis not present

## 2019-08-14 DIAGNOSIS — I169 Hypertensive crisis, unspecified: Secondary | ICD-10-CM | POA: Diagnosis not present

## 2019-08-14 DIAGNOSIS — I5032 Chronic diastolic (congestive) heart failure: Secondary | ICD-10-CM | POA: Diagnosis not present

## 2019-08-14 DIAGNOSIS — I161 Hypertensive emergency: Secondary | ICD-10-CM | POA: Diagnosis present

## 2019-08-14 DIAGNOSIS — J9600 Acute respiratory failure, unspecified whether with hypoxia or hypercapnia: Secondary | ICD-10-CM | POA: Diagnosis not present

## 2019-08-14 DIAGNOSIS — E1129 Type 2 diabetes mellitus with other diabetic kidney complication: Secondary | ICD-10-CM | POA: Diagnosis not present

## 2019-08-14 DIAGNOSIS — R4182 Altered mental status, unspecified: Secondary | ICD-10-CM | POA: Diagnosis not present

## 2019-08-14 DIAGNOSIS — E039 Hypothyroidism, unspecified: Secondary | ICD-10-CM | POA: Diagnosis not present

## 2019-08-14 DIAGNOSIS — R404 Transient alteration of awareness: Secondary | ICD-10-CM | POA: Diagnosis not present

## 2019-08-14 DIAGNOSIS — I5042 Chronic combined systolic (congestive) and diastolic (congestive) heart failure: Secondary | ICD-10-CM | POA: Diagnosis not present

## 2019-08-14 DIAGNOSIS — Y92239 Unspecified place in hospital as the place of occurrence of the external cause: Secondary | ICD-10-CM | POA: Diagnosis not present

## 2019-08-14 DIAGNOSIS — Z8616 Personal history of COVID-19: Secondary | ICD-10-CM | POA: Diagnosis not present

## 2019-08-14 DIAGNOSIS — G936 Cerebral edema: Secondary | ICD-10-CM | POA: Diagnosis not present

## 2019-08-14 DIAGNOSIS — Z7401 Bed confinement status: Secondary | ICD-10-CM | POA: Diagnosis not present

## 2019-08-14 DIAGNOSIS — D631 Anemia in chronic kidney disease: Secondary | ICD-10-CM | POA: Diagnosis not present

## 2019-08-14 DIAGNOSIS — E1122 Type 2 diabetes mellitus with diabetic chronic kidney disease: Secondary | ICD-10-CM | POA: Diagnosis present

## 2019-08-14 DIAGNOSIS — Z66 Do not resuscitate: Secondary | ICD-10-CM | POA: Diagnosis not present

## 2019-08-14 DIAGNOSIS — N184 Chronic kidney disease, stage 4 (severe): Secondary | ICD-10-CM | POA: Diagnosis not present

## 2019-08-14 DIAGNOSIS — R69 Illness, unspecified: Secondary | ICD-10-CM | POA: Diagnosis not present

## 2019-08-14 DIAGNOSIS — Z5181 Encounter for therapeutic drug level monitoring: Secondary | ICD-10-CM | POA: Diagnosis not present

## 2019-08-14 DIAGNOSIS — R41841 Cognitive communication deficit: Secondary | ICD-10-CM | POA: Diagnosis not present

## 2019-08-14 DIAGNOSIS — R2681 Unsteadiness on feet: Secondary | ICD-10-CM | POA: Diagnosis not present

## 2019-08-14 DIAGNOSIS — I4821 Permanent atrial fibrillation: Secondary | ICD-10-CM | POA: Diagnosis not present

## 2019-08-14 DIAGNOSIS — E119 Type 2 diabetes mellitus without complications: Secondary | ICD-10-CM | POA: Diagnosis not present

## 2019-08-14 DIAGNOSIS — Z452 Encounter for adjustment and management of vascular access device: Secondary | ICD-10-CM | POA: Diagnosis not present

## 2019-08-14 LAB — BASIC METABOLIC PANEL
Anion gap: 11 (ref 5–15)
BUN: 46 mg/dL — ABNORMAL HIGH (ref 8–23)
CO2: 29 mmol/L (ref 22–32)
Calcium: 8.8 mg/dL — ABNORMAL LOW (ref 8.9–10.3)
Chloride: 96 mmol/L — ABNORMAL LOW (ref 98–111)
Creatinine, Ser: 4.98 mg/dL — ABNORMAL HIGH (ref 0.44–1.00)
GFR calc Af Amer: 9 mL/min — ABNORMAL LOW (ref >60)
GFR calc non Af Amer: 8 mL/min — ABNORMAL LOW (ref >60)
Glucose, Bld: 93 mg/dL (ref 70–99)
Potassium: 3.8 mmol/L (ref 3.5–5.1)
Sodium: 136 mmol/L (ref 135–145)

## 2019-08-14 LAB — GLUCOSE, CAPILLARY
Glucose-Capillary: 89 mg/dL (ref 70–99)
Glucose-Capillary: 144 mg/dL — ABNORMAL HIGH (ref 70–99)
Glucose-Capillary: 101 mg/dL — ABNORMAL HIGH (ref 70–99)

## 2019-08-14 LAB — CBC
HCT: 28 % — ABNORMAL LOW (ref 36.0–46.0)
Hemoglobin: 8.3 g/dL — ABNORMAL LOW (ref 12.0–15.0)
MCH: 29.6 pg (ref 26.0–34.0)
MCHC: 29.6 g/dL — ABNORMAL LOW (ref 30.0–36.0)
MCV: 100 fL (ref 80.0–100.0)
Platelets: 354 10*3/uL (ref 150–400)
RBC: 2.8 MIL/uL — ABNORMAL LOW (ref 3.87–5.11)
RDW: 16.9 % — ABNORMAL HIGH (ref 11.5–15.5)
WBC: 7.7 10*3/uL (ref 4.0–10.5)
nRBC: 0 % (ref 0.0–0.2)

## 2019-08-14 LAB — PROTIME-INR
INR: 1.5 — ABNORMAL HIGH (ref 0.8–1.2)
Prothrombin Time: 18.1 seconds — ABNORMAL HIGH (ref 11.4–15.2)

## 2019-08-14 MED ORDER — ALBUMIN HUMAN 25 % IV SOLN
12.5000 g | Freq: Once | INTRAVENOUS | Status: AC
  Administered 2019-08-14: 12.5 g via INTRAVENOUS

## 2019-08-14 MED ORDER — ALBUMIN HUMAN 25 % IV SOLN
12.5000 g | Freq: Once | INTRAVENOUS | Status: AC
  Administered 2019-08-14: 12.5 g via INTRAVENOUS
  Filled 2019-08-14: qty 100

## 2019-08-14 MED ORDER — OXYCODONE HCL 5 MG PO TABS: 5.0000 mg | ORAL_TABLET | Freq: Four times a day (QID) | ORAL | 0 refills | Status: AC | PRN

## 2019-08-14 MED ORDER — HYDROCODONE-ACETAMINOPHEN 7.5-325 MG PO TABS
1.0000 | ORAL_TABLET | Freq: Four times a day (QID) | ORAL | Status: DC | PRN
  Administered 2019-08-14: 1 via ORAL
  Filled 2019-08-14: qty 1

## 2019-08-14 MED ORDER — WARFARIN SODIUM 5 MG PO TABS: 5.0000 mg | ORAL_TABLET | Freq: Once | ORAL | Status: DC

## 2019-08-14 MED ORDER — SENNOSIDES-DOCUSATE SODIUM 8.6-50 MG PO TABS: 1.0000 | ORAL_TABLET | Freq: Every day | ORAL | Status: AC

## 2019-08-14 MED ORDER — POLYETHYLENE GLYCOL 3350 17 G PO PACK: 17.0000 g | PACK | Freq: Every day | ORAL | 0 refills | Status: AC | PRN

## 2019-08-14 MED ORDER — WARFARIN SODIUM 4 MG PO TABS: 4.0000 mg | ORAL_TABLET | Freq: Every day | ORAL | Status: DC

## 2019-08-14 MED ORDER — ACETAMINOPHEN 325 MG PO TABS: 650.0000 mg | ORAL_TABLET | Freq: Four times a day (QID) | ORAL | Status: AC

## 2019-08-14 NOTE — Progress Notes (Signed)
ANTICOAGULATION CONSULT NOTE - Follow Up Consult  Pharmacy Consult for warfarin Indication: atrial fibrillation   Allergies  Allergen Reactions  . Insulin Glargine Swelling    "Makes me swell like a balloon all over", including face, but without any respiratory distress or rashes. Associated with weight gain.  . Statins Other (See Comments)    "I've tried them all; my muscle aches were so bad I couldn't walk".    Patient Measurements: Height: 5\' 4"  (162.6 cm) Weight: 160 lb 11.5 oz (72.9 kg) IBW/kg (Calculated) : 54.7 HEPARIN DW (KG): 69.7  Vital Signs: Temp: 98.7 F (37.1 C) (03/19 1037) Temp Source: Oral (03/19 1037) BP: 121/55 (03/19 1300) Pulse Rate: 75 (03/19 1300)  Labs: Recent Labs    08/11/19 1654 08/12/19 0738 08/12/19 0738 08/13/19 7322 08/14/19 0624 08/14/19 0635  HGB  --  9.3*   < > 9.2*  --  8.3*  HCT  --  30.6*  --  30.4*  --  28.0*  PLT  --  383  --  346  --  354  LABPROT  --  17.1*  --  15.7*  --  18.1*  INR  --  1.4*  --  1.3*  --  1.5*  HEPARINUNFRC 0.39 0.68  --   --   --   --   CREATININE  --  5.20*  --  3.25* 4.98*  --    < > = values in this interval not displayed.    Estimated Creatinine Clearance: 9 mL/min (A) (by C-G formula based on SCr of 4.98 mg/dL (H)).  Assessment: Patient with afib and on chronic warfarin completed kyphoplasty 3/17. Restart warfarin post procedure with current INR 1.5.  Vitamin K 2.5 mg PO given 3/15 and 3/16    Patient on chronic amiodarone 200 mg daily  Home dose listed as 5 mg Tue/Sat and 7.5 mg ROW. Patient supratherapeutic during admission so will be cautious with dosing.  Goal of Therapy:  INR 2-3 Monitor platelets by anticoagulation protocol: Yes   Plan:  Warfarin 5 mg x 1 dose. Monitor daily INR and s/s of bleeding.   Thank you for involving pharmacy in this patient's care.  Margot Ables, PharmD Clinical Pharmacist 08/14/2019 1:22 PM

## 2019-08-14 NOTE — Progress Notes (Signed)
PT Cancellation Note  Patient Details Name: [name] MRN: [id number] DOB: [birth year]   Cancelled Treatment:    Reason Eval/Treat Not Completed: Patient at procedure or test/unavailable  Ihor Austin, LPTA/CLT; CBIS [phone]  Aldona Lento 08/14/2019, 10:44 AM

## 2019-08-14 NOTE — Discharge Summary (Signed)
Physician Discharge Summary  [name]:[id number] DOB: 1941/03/21 DOA: 08/03/2019  PCP: Doree Albee, MD  Admit date: 08/03/2019 Discharge date: 08/14/2019  Admitted From:  Home  Disposition:  SNF   Recommendations for Outpatient Follow-up:  1. Follow up with PCP in 2-3 weeks 2. Resume regular hemodialysis schedule MWF 3. Follow up with cardiology as scheduled.  4. Please monitor PT/INR daily until therapeutic and adjust warfarin dose as needed for goal INR 2-3 5. Bleeding precautions while on anticoagulant therapy.   Discharge Condition: STABLE   CODE STATUS: FULL    Brief Hospitalization Summary: Please see all hospital notes, images, labs for full details of the hospitalization. ADMISSION HPI: [name] is a 79 y.o. female with PMH significant for type 2 diabetes with nephropathy, A, fib (on coumadin), HTN, chronic diastolic HF, GERD, ESRD (M-W-F schedule), mixed HLD, hypothyroidism and recent ED visit after mechanical fall with findings of T6-T7 compression fracture (08/01/19); who was brought to ED by EMS secondary to episode of unresponsiveness and AMS. Patient with partial resolution to her presenting symptoms after Narcan given by EMS, but found with hyperkalemia, new requirement of oxygen supplementation and significantly bradycardic. Patient denies CP, fever, chills, hematochezia, melena, abd pain, blurred vision, skipping beat sensations or focal weakness.  BP wass stable and CXR demonstrated mild vascular congestion. Calcium gluconate and lokelma given in ED along with atropine. Cardiology and nephrology consulted. [facility] contacted to admit patient for further evaluation and management.  Of note, patient is complaining of non radiating mid-back pain, 6-7/10 in intensity pain.   Brief History: 79 year old female with a history of paroxysmal atrial fibrillation on warfarin, hypertension, diastolic CHF, ESRD(MWF), hyperlipidemia, hypothyroidism, coronary artery  disease presenting with altered mental status. The patient was recently in the emergency department on 08/01/2019 after mechanical fall resulting in a T6-7 compression fracture. She was sent home in stable condition with hydrocodone. However, her caretaker and significant other found the patient with altered mental status and unresponsiveness. EMS was activated. The patient's mental status showed improvement after given Narcan. However, the patient was noted to have hyperkalemia with new oxygen requirement with bradycardia in the 30-40 range. Chest x-ray showed mild vascular congestion. The patient was given calcium gluconate and Lokelma in the emergency department with atropine. Cardiology and nephrology were consulted.  IR was consulted and kyphoplasty was performed on 08/12/19.  Assessment/Plan: Acute toxic encephalopathy -Likely related to the patient's opioid medication--significant other was giving pt opioids q2-4 hour at home -Recently started on hydrocodone for her thoracic compression fractureas outpt -08/07/19--more confused, likely due to pt getting fentanyl AND hydrocodone -discontinue IV fentanyl and decrease gabapentin-->mental status improved -d/c gabapentin as significant other complains of confusion at bedtime-->improved after stopping gabapentin.  -Did NOT restart gabapentin as it causes too much somnolence and confusion for patient  Thoracic spine compression fracture(T6-T7) -Unfortunately, patient is unable to sit up for transport or dialysis -CTthoracicspine--Oblique fracture through the T7 vertebral body. Minimal loss of height of T6 and T7, no more than 10-15%. No retropulsed bone. -Consult IR for possible kyphoplasty-->requesting MR thoracic spine to fully evaluate -Judicious opioid treatment--start norco 5/325 -3/11 MR T-spine--acute T7 fx with <50% loss of height; Acute to subacute T6 fx with <50% loss of height -kyphoplasty 08/12/19 completed and patient  tolerated procedure well.  She has been working with PT for further ambulation, Pt has been able to sit up in chair for prolonged periods of time and tolerated having HD sitting in chair.  Transient junctional bradycardia -Appreciate cardiology consult -May have been partly attributable to her hyperkalemia in the setting of ESRD -Amiodarone restarted 200 mg daily and has controlled heart rate very well -observe off of metoprolol as she has been having bradycardia.  -no further bradycardia or pauses -Metoprolol was not restarted due to recurrent severe bradycardia.  HR has been well controlled on amiodarone alone.  -Follow up with cardiology as scheduled.   Chronic diastolic CHF -Volume status managed by hemodialysis -08/04/2019 echo EF of 65-70%, grade 2 DD, mild elevated PASP, mild MR/TR  ESRD -Appreciate nephrology consult -Hemodialysis was given while in hospital  Paroxysmal atrial fibrillation -rate controlled -Appreciate cardiology -restarted warfarin after procedure on 3/17, no need to bridge with heparin -Restarted amiodarone -pharmD managing warfarin dosing, following PT/INR closely.  -DC on warfarin 4 mg daily and recommend check PT/INR daily until therapeutic and adjust warfarin dose as needed for goal INR 2-3.   -Bleeding precautions while on anticoagulant therapy.    Hypothyroidism -Continue [place] thyroid  Essential hypertension -We did not restart metoprolol succinate and amlodipine secondary to soft blood pressures and bradycardia  Disposition Plan: Patient From:Home D/C Place:SNF when insurance authorizes  Family Communication:Significant other updatedat bedside 08/08/19, attempted phone call unanswered 3/18  Consultants:Renal; cardiology  Code Status: FULL   DVT Prophylaxis:warfarin   Procedures: As Listed in Progress Note Above  Antibiotics: None   Discharge Diagnoses:  Principal Problem:   Episode of  unresponsiveness Active Problems:   Mixed hyperlipidemia   Essential hypertension, benign   Hyperkalemia   ESRD (end stage renal disease) on dialysis ([place])   Chronic diastolic HF (heart failure) (HCC)   Respiratory failure with hypoxia ([place])   Diabetes mellitus type 2, controlled, with complications ([place])   Atrial fibrillation (HCC)   Bradycardia   Pressure injury of buttock, stage 2 (HCC)   Compression fx, thoracic spine ([place])   Chronic renal failure   Discharge Instructions:  Allergies as of 08/14/2019      Reactions   Insulin Glargine Swelling   "Makes me swell like a balloon all over", including face, but without any respiratory distress or rashes. Associated with weight gain.   Statins Other (See Comments)   "I've tried them all; my muscle aches were so bad I couldn't walk".      Medication List    STOP taking these medications   amLODipine 10 MG tablet Commonly known as: NORVASC   gabapentin 300 MG capsule Commonly known as: NEURONTIN   HYDROcodone-acetaminophen 5-325 MG tablet Commonly known as: NORCO/VICODIN   metoprolol succinate 25 MG 24 hr tablet Commonly known as: Toprol XL     TAKE these medications   acetaminophen 325 MG tablet Commonly known as: TYLENOL Take 2 tablets (650 mg total) by mouth every 6 (six) hours. What changed:   when to take this  reasons to take this   amiodarone 200 MG tablet Commonly known as: PACERONE Take 1 tablet (200 mg total) by mouth daily.   aspirin 81 MG EC tablet Take 1 tablet (81 mg total) by mouth daily.   febuxostat 40 MG tablet Commonly known as: ULORIC Take 1 tablet by mouth once daily   multivitamin with minerals Tabs tablet Take 1 tablet by mouth daily.   NP Thyroid 120 MG tablet Generic drug: thyroid Take 1 tablet by mouth once daily   ondansetron 4 MG tablet Commonly known as: ZOFRAN TAKE 1 TABLET BY MOUTH EVERY 8 HOURS AS NEEDED FOR NAUSEA AND VOMITING What changed:  See the new instructions.    oxyCODONE 5 MG immediate release tablet Commonly known as: Oxy IR/ROXICODONE Take 1 tablet (5 mg total) by mouth every 6 (six) hours as needed for up to 5 days for severe pain.   pantoprazole 40 MG tablet Commonly known as: PROTONIX TAKE 1 TABLET BY MOUTH TWICE DAILY 30 MINUTES BEFORE BREAKFAST What changed: See the new instructions.   PhosLo 667 MG capsule Generic drug: calcium acetate Take 667-2,001 mg by mouth in the morning, at noon, in the evening, and at bedtime. Take 3 capsules with each meal and 1 capsule with snacks daily   polyethylene glycol 17 g packet Commonly known as: MIRALAX / GLYCOLAX Take 17 g by mouth daily as needed for mild constipation.   Repatha SureClick 546 MG/ML Soaj Generic drug: Evolocumab Inject 140 mg into the skin every 14 (fourteen) days.   senna-docusate 8.6-50 MG tablet Commonly known as: Senokot-S Take 1 tablet by mouth at bedtime.   Vitamin D-3 125 MCG (5000 UT) Tabs Take 5,000 mg by mouth daily.   warfarin 4 MG tablet Commonly known as: COUMADIN Take as directed. If you are unsure how to take this medication, talk to your nurse or doctor. Original instructions: Take 1 tablet (4 mg total) by mouth daily at 6 PM. What changed:   medication strength  how much to take  how to take this  when to take this  additional instructions       Contact information for follow-up providers    Isaiah Serge, NP Follow up on 08/19/2019.   Specialties: Cardiology, Radiology Why: Cardiology Hospital Follow-up on 08/19/2019 at 3:00 PM. Contact information: [place] [address] [phone]            Contact information for after-discharge care    [facility] .   Service: Skilled Nursing Contact information: [place] [phone]                 Allergies  Allergen Reactions  . Insulin Glargine Swelling    "Makes me swell like a  balloon all over", including face, but without any respiratory distress or rashes. Associated with weight gain.  . Statins Other (See Comments)    "I've tried them all; my muscle aches were so bad I couldn't walk".   Allergies as of 08/14/2019      Reactions   Insulin Glargine Swelling   "Makes me swell like a balloon all over", including face, but without any respiratory distress or rashes. Associated with weight gain.   Statins Other (See Comments)   "I've tried them all; my muscle aches were so bad I couldn't walk".      Medication List    STOP taking these medications   amLODipine 10 MG tablet Commonly known as: NORVASC   gabapentin 300 MG capsule Commonly known as: NEURONTIN   HYDROcodone-acetaminophen 5-325 MG tablet Commonly known as: NORCO/VICODIN   metoprolol succinate 25 MG 24 hr tablet Commonly known as: Toprol XL     TAKE these medications   acetaminophen 325 MG tablet Commonly known as: TYLENOL Take 2 tablets (650 mg total) by mouth every 6 (six) hours. What changed:   when to take this  reasons to take this   amiodarone 200 MG tablet Commonly known as: PACERONE Take 1 tablet (200 mg total) by mouth daily.   aspirin 81 MG EC tablet Take 1 tablet (81 mg  total) by mouth daily.   febuxostat 40 MG tablet Commonly known as: ULORIC Take 1 tablet by mouth once daily   multivitamin with minerals Tabs tablet Take 1 tablet by mouth daily.   NP Thyroid 120 MG tablet Generic drug: thyroid Take 1 tablet by mouth once daily   ondansetron 4 MG tablet Commonly known as: ZOFRAN TAKE 1 TABLET BY MOUTH EVERY 8 HOURS AS NEEDED FOR NAUSEA AND VOMITING What changed: See the new instructions.   oxyCODONE 5 MG immediate release tablet Commonly known as: Oxy IR/ROXICODONE Take 1 tablet (5 mg total) by mouth every 6 (six) hours as needed for up to 5 days for severe pain.   pantoprazole 40 MG tablet Commonly known as: PROTONIX TAKE 1 TABLET BY MOUTH TWICE DAILY 30  MINUTES BEFORE BREAKFAST What changed: See the new instructions.   PhosLo 667 MG capsule Generic drug: calcium acetate Take 667-2,001 mg by mouth in the morning, at noon, in the evening, and at bedtime. Take 3 capsules with each meal and 1 capsule with snacks daily   polyethylene glycol 17 g packet Commonly known as: MIRALAX / GLYCOLAX Take 17 g by mouth daily as needed for mild constipation.   Repatha SureClick 354 MG/ML Soaj Generic drug: Evolocumab Inject 140 mg into the skin every 14 (fourteen) days.   senna-docusate 8.6-50 MG tablet Commonly known as: Senokot-S Take 1 tablet by mouth at bedtime.   Vitamin D-3 125 MCG (5000 UT) Tabs Take 5,000 mg by mouth daily.   warfarin 4 MG tablet Commonly known as: COUMADIN Take as directed. If you are unsure how to take this medication, talk to your nurse or doctor. Original instructions: Take 1 tablet (4 mg total) by mouth daily at 6 PM. What changed:   medication strength  how much to take  how to take this  when to take this  additional instructions       Procedures/Studies: DG Ribs Unilateral W/Chest Right  Result Date: 08/01/2019 CLINICAL DATA:  Right posterior rib tenderness following a fall this morning. EXAM: RIGHT RIBS AND CHEST - 3+ VIEW COMPARISON:  Portable chest dated 05/19/2019 FINDINGS: Normal sized heart. Post CABG changes. Small amount of interval linear atelectasis or scarring at the lung bases. Stable left proximal humerus fixation hardware. Old, healed right rib fractures with no acute rib fractures or pneumothorax seen. Previously described T6 and T7 vertebral compression deformities, better visualized on the thoracic spine radiographs obtained at the same time. IMPRESSION: 1. No acute rib fractures seen. 2. Small amount of interval bibasilar linear atelectasis or scarring. 3. Separately described interval T6 and T7 vertebral compression deformities. Electronically Signed   By: Claudie Revering M.D.   On:  08/01/2019 13:45   DG Thoracic Spine 2 View  Result Date: 08/01/2019 CLINICAL DATA:  Midline back tenderness following a fall. Upper back pain. EXAM: THORACIC SPINE 2 VIEWS COMPARISON:  Chest radiographs dated 01/05/2019 FINDINGS: Site scratch the interval approximately 20% inferior endplate compression deformity of the T6 vertebral body and approximately 30% compression deformity of the T7 vertebral body. No significant bony retropulsion visualized. No well visualized acute fracture lines. Post CABG changes and previously demonstrated left humerus fixation hardware. Thoracolumbar spine degenerative changes. IMPRESSION: 1. Interval approximately 20% inferior endplate compression deformity of the T6 vertebral body and approximately 30% compression deformity of the T7 vertebral body. 2. Thoracolumbar spine degenerative changes. Electronically Signed   By: Claudie Revering M.D.   On: 08/01/2019 13:42   CT THORACIC SPINE WO  CONTRAST  Result Date: 08/05/2019 CLINICAL DATA:  [place] on 08/01/2019.  Back pain. EXAM: CT THORACIC SPINE WITHOUT CONTRAST TECHNIQUE: Multidetector CT images of the thoracic were obtained using the standard protocol without intravenous contrast. COMPARISON:  Radiography 08/01/2019.  CT chest 11/07/2018 2 FINDINGS: Alignment: Normal Vertebrae: The bones appear in general osteopenic. Widespread ankylosis of the spine. There do appear to be recent fractures at T6 and T7 with minimal loss of height. Fracture extends in an oblique fashion from the lower right vertebral body at T7, ascending upwards towards the left of the T7 vertebral body. The fracture involves the previously solid anterior osteophyte or syndesmophyte column. I can not show any involvement of the posterior elements, therefore it would be presumed stable. Paraspinal and other soft tissues: Bilateral pleural effusions layering dependently with dependent atelectasis, left more than right. Disc levels: No significant disc space pathology.  No bony stenosis of the canal or foramina. 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 Oblique fracture through the T7 vertebral body. Minimal loss of height of T6 and T7, no more than 10-15%. No retropulsed bone. No evidence of extension of the fracture through the fused posterior elements. Electronically Signed   By: Nelson Chimes M.D.   On: 08/05/2019 19:44   MR THORACIC SPINE WO CONTRAST  Result Date: 08/06/2019 CLINICAL DATA:  Mid back pain, CT with fracture EXAM: MRI THORACIC SPINE WITHOUT CONTRAST TECHNIQUE: Multiplanar, multisequence MR imaging of the thoracic spine was performed. No intravenous contrast was administered. COMPARISON:  Correlation made with CT thoracic spine 08/05/2019 FINDINGS: Axial images do not localize to sagittal images. Alignment:  No significant listhesis. Vertebrae: Vertebral body numbering is based on the prior CT as artifact distorts the upper cervical spine on the scout sequence. There are fractures through the T6 and T7 vertebral bodies. There is less than 50% loss of vertebral body height and no substantial osseous retropulsion. There is disruption of the likely calcified anterior longitudinal ligament as well as a bridging syndesmophyte are osteophyte at T7-T8. Posterior longitudinal ligament appears intact. Cord:  No abnormal signal. Paraspinal and other soft tissues: Probable prevertebral edema at T6-T8. Disc levels: Mild multilevel degenerative disc disease and facet arthropathy. There is no high-grade stenosis. IMPRESSION: Acute T7 vertebral body fracture with less than 50% loss of height. There is likely small focal disruption of the anterior longitudinal ligament at this level. Acute to subacute T6 vertebral body fracture with less than 50% loss of height. No significant osseous retropulsion. Electronically Signed   By: Macy Mis M.D.   On: 08/06/2019 14:35   DG Chest Portable 1  View  Result Date: 08/03/2019 CLINICAL DATA:  Altered level of consciousness, bradycardia EXAM: PORTABLE CHEST 1 VIEW COMPARISON:  08/01/2019 FINDINGS: Cardiomegaly. Prior CABG. Elevation of the left hemidiaphragm with left base atelectasis and small left effusion. No confluent opacity on the right. Mild vascular congestion. IMPRESSION: Mild cardiomegaly, vascular congestion. Elevation of the left hemidiaphragm with left base atelectasis and small left effusion. Electronically Signed   By: Rolm Baptise M.D.   On: 08/03/2019 11:58   ECHOCARDIOGRAM COMPLETE  Result Date: 08/04/2019    ECHOCARDIOGRAM REPORT   Patient Name:   SHAYLAH MCGHIE Date of Exam: 08/04/2019 Medical Rec #:  [id number]        Height:       64.0 in Accession #:    [id number]       Weight:       164.5  lb Date of Birth:  05/16/1941        BSA:          1.800 m Patient Age:    35 years         BP:           139/42 mmHg Patient Gender: F                HR:           68 bpm. Exam Location:  [facility] Procedure: 2D Echo Indications:    Atrial Fibrillation 427.31 / I48.91  History:        Patient has prior history of Echocardiogram examinations, most                 recent 11/10/2018. Arrythmias:Atrial Fibrillation; Risk                 Factors:Hypertension, Dyslipidemia and Diabetes. ESRD                 Respiratory failure with hypoxia.  Sonographer:    Vikki Ports Turrentine Referring Phys: [place]  1. Left ventricular ejection fraction, by estimation, is 65 to 70%. The left ventricle has normal function. The left ventricle has no regional wall motion abnormalities. Left ventricular diastolic parameters are consistent with Grade II diastolic dysfunction (pseudonormalization).  2. Right ventricular systolic function is normal. The right ventricular size is normal. There is mildly elevated pulmonary artery systolic pressure. The estimated right ventricular systolic pressure is 77.8 mmHg.  3. Left atrial size was upper normal.  4.  The mitral valve is degenerative. Mild mitral valve regurgitation.  5. The aortic valve is tricuspid. Aortic valve regurgitation is not visualized.  6. The inferior vena cava is normal in size with greater than 50% respiratory variability, suggesting right atrial pressure of 3 mmHg. FINDINGS  Left Ventricle: Left ventricular ejection fraction, by estimation, is 65 to 70%. The left ventricle has normal function. The left ventricle has no regional wall motion abnormalities. The left ventricular internal cavity size was normal in size. There is  no left ventricular hypertrophy. Left ventricular diastolic parameters are consistent with Grade II diastolic dysfunction (pseudonormalization). Right Ventricle: The right ventricular size is normal. No increase in right ventricular wall thickness. Right ventricular systolic function is normal. There is mildly elevated pulmonary artery systolic pressure. The tricuspid regurgitant velocity is 2.88  m/s, and with an assumed right atrial pressure of 3 mmHg, the estimated right ventricular systolic pressure is 24.2 mmHg. Left Atrium: Left atrial size was upper normal. Right Atrium: Right atrial size was normal in size. Pericardium: There is no evidence of pericardial effusion. Mitral Valve: The mitral valve is degenerative in appearance. There is mild calcification of the mitral valve leaflet(s). Mild mitral annular calcification. Mild mitral valve regurgitation. Tricuspid Valve: The tricuspid valve is grossly normal. Tricuspid valve regurgitation is mild. Aortic Valve: The aortic valve is tricuspid. Aortic valve regurgitation is not visualized. Mild aortic valve annular calcification. Aortic valve mean gradient measures 7.0 mmHg. Aortic valve peak gradient measures 12.7 mmHg. Aortic valve area, by VTI measures 2.05 cm. Pulmonic Valve: The pulmonic valve was grossly normal. Pulmonic valve regurgitation is not visualized. Aorta: The aortic root is normal in size and structure.  Venous: The inferior vena cava is normal in size with greater than 50% respiratory variability, suggesting right atrial pressure of 3 mmHg. IAS/Shunts: No atrial level shunt detected by color flow Doppler.  LEFT VENTRICLE PLAX 2D LVIDd:  4.98 cm  Diastology LVIDs:         3.44 cm  LV e' lateral:   14.00 cm/s LV PW:         0.74 cm  LV E/e' lateral: 9.3 LV IVS:        0.83 cm  LV e' medial:    4.57 cm/s LVOT diam:     1.80 cm  LV E/e' medial:  28.4 LV SV:         88 LV SV Index:   49 LVOT Area:     2.54 cm  RIGHT VENTRICLE RV S prime:     8.70 cm/s TAPSE (M-mode): 1.7 cm LEFT ATRIUM             Index       RIGHT ATRIUM           Index LA diam:        4.10 cm 2.28 cm/m  RA Area:     19.40 cm LA Vol (A2C):   42.7 ml 23.72 ml/m RA Volume:   56.80 ml  31.55 ml/m LA Vol (A4C):   58.8 ml 32.66 ml/m LA Biplane Vol: 53.3 ml 29.61 ml/m  AORTIC VALVE AV Area (Vmax):    1.72 cm AV Area (Vmean):   1.89 cm AV Area (VTI):     2.05 cm AV Vmax:           178.00 cm/s AV Vmean:          122.000 cm/s AV VTI:            0.431 m AV Peak Grad:      12.7 mmHg AV Mean Grad:      7.0 mmHg LVOT Vmax:         120.00 cm/s LVOT Vmean:        90.400 cm/s LVOT VTI:          0.347 m LVOT/AV VTI ratio: 0.81  AORTA Ao Root diam: 2.60 cm MITRAL VALVE                TRICUSPID VALVE MV Area (PHT): 6.54 cm     TR Peak grad:   33.2 mmHg MV Decel Time: 116 msec     TR Vmax:        288.00 cm/s MV E velocity: 130.00 cm/s MV A velocity: 111.00 cm/s  SHUNTS MV E/A ratio:  1.17         Systemic VTI:  0.35 m                             Systemic Diam: 1.80 cm Rozann Lesches MD Electronically signed by Rozann Lesches MD Signature Date/Time: 08/04/2019/10:07:37 AM    Final       Subjective: Pt reports that she has back pain but has been able to manage sitting up in chair for prolonged periods of time.  No loss of bowel or bladder control or function.   Discharge Exam: Vitals:   08/14/19 1330 08/14/19 1417  BP: (!) 123/51 130/60  Pulse:  76 78  Resp: 20 20  Temp:  98.6 F (37 C)  SpO2:  97%   Vitals:   08/14/19 1315 08/14/19 1320 08/14/19 1330 08/14/19 1417  BP: (!) 103/43 (!) 114/48 (!) 123/51 130/60  Pulse: 77 77 76 78  Resp: 18 20 20 20   Temp:    98.6 F (37 C)  TempSrc:    Oral  SpO2:    97%  Weight:    72 kg  Height:       General: Pt is alert, awake, not in acute distress Cardiovascular: RRR, S1/S2 +, no rubs, no gallops Respiratory: CTA bilaterally, no wheezing, no rhonchi Abdominal: Soft, NT, ND, bowel sounds + Extremities: no edema, no cyanosis Neurological: nonfocal   The results of significant diagnostics from this hospitalization (including imaging, microbiology, ancillary and laboratory) are listed below for reference.     Microbiology: No results found for this or any previous visit (from the past 240 hour(s)).   Labs: BNP (last 3 results) No results for input(s): BNP in the last 8760 hours. Basic Metabolic Panel: Recent Labs  Lab 08/07/19 1534 08/07/19 1534 08/10/19 1201 08/10/19 1644 08/12/19 0738 08/13/19 0638 08/14/19 0624  NA 132*   < > 132* 130* 127* 136 136  K 4.4   < > 4.4 3.5 4.5 3.9 3.8  CL 91*   < > 90* 91* 88* 95* 96*  CO2 27   < > 30 28 27 29 29   GLUCOSE 268*   < > 129* 149* 127* 108* 93  BUN 46*   < > 77* 27* 56* 30* 46*  CREATININE 6.06*   < > 6.35* 2.88* 5.20* 3.25* 4.98*  CALCIUM 8.5*   < > 9.3 8.3* 8.9 8.8* 8.8*  MG  --   --   --   --   --  2.2  --   PHOS 3.7  --  2.3* 1.2* 4.0 3.2  --    < > = values in this interval not displayed.   Liver Function Tests: Recent Labs  Lab 08/07/19 1534 08/10/19 1201 08/10/19 1644 08/12/19 0738 08/13/19 0638  ALBUMIN 2.7* 2.7* 2.8* 3.0* 3.0*   No results for input(s): LIPASE, AMYLASE in the last 168 hours. No results for input(s): AMMONIA in the last 168 hours. CBC: Recent Labs  Lab 08/10/19 0509 08/10/19 1201 08/12/19 0738 08/13/19 0638 08/14/19 0635  WBC 7.3 8.9 7.1 7.3 7.7  HGB 8.7* 8.5* 9.3* 9.2* 8.3*   HCT 28.7* 28.6* 30.6* 30.4* 28.0*  MCV 100.7* 101.4* 99.4 99.7 100.0  PLT 360 376 383 346 354   Cardiac Enzymes: No results for input(s): CKTOTAL, CKMB, CKMBINDEX, TROPONINI in the last 168 hours. BNP: Invalid input(s): POCBNP CBG: Recent Labs  Lab 08/13/19 1114 08/13/19 1639 08/13/19 2008 08/14/19 0726 08/14/19 1112  GLUCAP 130* 96 158* 89 144*   D-Dimer No results for input(s): DDIMER in the last 72 hours. Hgb A1c No results for input(s): HGBA1C in the last 72 hours. Lipid Profile No results for input(s): CHOL, HDL, LDLCALC, TRIG, CHOLHDL, LDLDIRECT in the last 72 hours. Thyroid function studies No results for input(s): TSH, T4TOTAL, T3FREE, THYROIDAB in the last 72 hours.  Invalid input(s): FREET3 Anemia work up No results for input(s): VITAMINB12, FOLATE, FERRITIN, TIBC, IRON, RETICCTPCT in the last 72 hours. Urinalysis    Component Value Date/Time   COLORURINE AMBER (A) 05/19/2019 0919   APPEARANCEUR HAZY (A) 05/19/2019 0919   LABSPEC 1.016 05/19/2019 0919   PHURINE 5.0 05/19/2019 0919   GLUCOSEU NEGATIVE 05/19/2019 0919   HGBUR NEGATIVE 05/19/2019 0919   BILIRUBINUR NEGATIVE 05/19/2019 0919   KETONESUR NEGATIVE 05/19/2019 0919   PROTEINUR 100 (A) 05/19/2019 0919   UROBILINOGEN 0.2 02/08/2015 1615   NITRITE NEGATIVE 05/19/2019 0919   LEUKOCYTESUR NEGATIVE 05/19/2019 0919   Sepsis Labs Invalid input(s): PROCALCITONIN,  WBC,  LACTICIDVEN Microbiology No results found for this or  any previous visit (from the past 240 hour(s)).  Time coordinating discharge: 35 minutes   SIGNED:  Irwin Brakeman, MD  [facility] 08/14/2019, 2:40 PM How to contact the [facility] Attending or Consulting provider [place] or covering provider during after hours [place], for this patient?  1. Check the care team in [facility] and look for a) attending/consulting TRH provider listed and b) the [facility] team listed 2. Log into [email or web address] and use [place]'s universal password to access.  If you do not have the password, please contact the hospital operator. 3. Locate the [facility] provider you are looking for under [facility] and page to a number that you can be directly reached. 4. If you still have difficulty reaching the provider, please page the [facility] (Director on Call) for the Hospitalists listed on amion for assistance.

## 2019-08-14 NOTE — TOC Transition Note (Signed)
Transition of Care [facility]) - CM/SW Discharge Note   Patient Details  Name: [name] MRN: [id number] Date of Birth: 1941/04/10  [facility] CM/SW Contact:  Boneta Lucks, RN Phone Number: 08/14/2019, 3:16 PM   Clinical Narrative:   Patient medically ready. [facility] does not have bed. [facility] also offered, CM spoke with [name], he accepted bed offer and will update her son. Clinicals sent to [name], She started [facility] AUTH. Reviewed that patient is on HD in [employer]. Jackelyn Poling will confirm chair time. RN to call report.     Final next level of care: Skilled Nursing Facility Barriers to Discharge: Barriers Resolved   Patient Goals and CMS Choice Patient states their goals for this hospitalization and ongoing recovery are:: Transfer to SNF and then return home CMS Medicare.gov Compare Post Acute Care list provided to:: Patient Represenative (must comment) Choice offered to / list presented to : (sign other)  Discharge Placement              Patient chooses bed at: Other - please specify in the comment section below:([facility]) Patient to be transferred to facility by: EMS Name of family member notified: [name] Patient and family notified of of transfer: 08/14/19  Discharge Plan and Services    Readmission Risk Interventions Readmission Risk Prevention Plan 08/04/2019 11/27/2018 11/11/2018  Transportation Screening Complete Complete Complete  Social Work Consult for [place] Planning/Counseling - - Complete  Palliative Care Screening - - Not Applicable  Medication Review Press photographer) Complete Complete -  PCP or Specialist appointment within 3-5 days of discharge Not Complete - -  PCP/Specialist Appt Not Complete comments Patient will discharge to SNF - -  [place] or [place] Not Complete Complete -  [place] or Home Care Consult Pt Refusal Comments Patient will discharge to SNF - -  SW Recovery Care/Counseling Consult Complete Complete -  Palliative Care Screening Not  Complete Not Applicable -  Comments Patient discharging to SNF - -  [place] Complete Not Applicable -  Some recent data might be hidden

## 2019-08-14 NOTE — Care Management Important Message (Signed)
Important Message  Patient Details  Name: [name] MRN: [id number] Date of Birth: 1941-04-16   Medicare Important Message Given:  Yes     Tommy Medal 08/14/2019, 2:41 PM

## 2019-08-14 NOTE — Procedures (Signed)
I was present at this dialysis session. I have reviewed the session itself and made appropriate changes.  Sitting in recliner during HD and complaining of back pain but tolerable.  Vital signs in last 24 hours:  Temp:  [98.1 F (36.7 C)-98.8 F (37.1 C)] 98.7 F (37.1 C) (03/19 1037) Pulse Rate:  [68-77] 69 (03/19 1100) Resp:  [16-18] 18 (03/19 1100) BP: (127-150)/(33-58) 127/53 (03/19 1100) SpO2:  [92 %-96 %] 95 % (03/19 0530) Weight:  [72.9 kg] 72.9 kg (03/19 0500) Weight change: 0.8 kg Filed Weights   08/13/19 0500 08/14/19 0027 08/14/19 0500  Weight: 73.4 kg 72.9 kg 72.9 kg    Recent Labs  Lab 08/13/19 0638 08/13/19 0638 08/14/19 0624  NA 136   < > 136  K 3.9   < > 3.8  CL 95*   < > 96*  CO2 29   < > 29  GLUCOSE 108*   < > 93  BUN 30*   < > 46*  CREATININE 3.25*   < > 4.98*  CALCIUM 8.8*   < > 8.8*  PHOS 3.2  --   --    < > = values in this interval not displayed.    Recent Labs  Lab 08/12/19 0738 08/13/19 0638 08/14/19 0635  WBC 7.1 7.3 7.7  HGB 9.3* 9.2* 8.3*  HCT 30.6* 30.4* 28.0*  MCV 99.4 99.7 100.0  PLT 383 346 354    Scheduled Meds: . amiodarone  200 mg Oral Daily  . aspirin EC  81 mg Oral Daily  . Chlorhexidine Gluconate Cloth  6 each Topical Q0600  . Chlorhexidine Gluconate Cloth  6 each Topical Q0600  . darbepoetin (ARANESP) injection - DIALYSIS  100 mcg Intravenous Q Tue-HD  . febuxostat  40 mg Oral Daily  . feeding supplement (NEPRO CARB STEADY)  237 mL Oral BID BM  . feeding supplement (PRO-STAT SUGAR FREE 64)  30 mL Oral BID  . insulin aspart  0-6 Units Subcutaneous TID WC  . lidocaine  1 patch Transdermal Q24H  . mouth rinse  15 mL Mouth Rinse BID  . multivitamin  1 tablet Oral QHS  . multivitamin with minerals  1 tablet Oral Daily  . pantoprazole  40 mg Oral Daily  . polyethylene glycol  17 g Oral Daily  . senna-docusate  1 tablet Oral QHS  . sodium chloride flush  3 mL Intravenous Q12H  . thyroid  120 mg Oral Daily  .  cholecalciferol  5,000 Units Oral Daily  . Warfarin - Pharmacist Dosing Inpatient   Does not apply q1800   Continuous Infusions: . sodium chloride    . sodium chloride    . sodium chloride     PRN Meds:.sodium chloride, sodium chloride, sodium chloride, acetaminophen, guaiFENesin-dextromethorphan, HYDROcodone-acetaminophen, lidocaine (PF), lidocaine-prilocaine, metoprolol tartrate, ondansetron **OR** ondansetron (ZOFRAN) IV, pentafluoroprop-tetrafluoroeth, polyethylene glycol, sodium chloride flush    Dialysis Orders: [facility]. EDW77kgHD Bath 2K/2.25CaTime 4 hoursHeparin none. AccessRUE AVFBFR 300DFR 600 Epogen7600Units IV/HD Venofer 50 mg IV qweek  Assessment/Plan: 1. AMS- presumably due to opioids, improved since admission off of fentanyl and gabapenin 2. Thoracic spine commpression fracture T6-7. s/p kyphoplasty per IR 08/12/19 with improved mobility but some discomfort. Working with PT. 3. ESRDcontinue with HD on MWF schedule 4. Anemia:cont with ESA 5. CKD-MBD:stable cont with meds 6. Nutrition:renal diet 7. Hypertension:stable 8. A fib with bradycardia- per cardiology 9. Disposition- will need to be able to tolerate sitting in a chair for prolonged periods of time  to allow for transfer to and from HD.  Donetta Potts,  MD 08/14/2019, 11:15 AM

## 2019-08-14 NOTE — Plan of Care (Signed)
Report called to Roselyn Reef at [facility], EMS notified of transfer.

## 2019-08-14 NOTE — Plan of Care (Signed)
Pt sat up in chair for 4 hours

## 2019-08-15 DIAGNOSIS — R69 Illness, unspecified: Secondary | ICD-10-CM | POA: Diagnosis not present

## 2019-08-15 DIAGNOSIS — E039 Hypothyroidism, unspecified: Secondary | ICD-10-CM | POA: Diagnosis not present

## 2019-08-15 DIAGNOSIS — D649 Anemia, unspecified: Secondary | ICD-10-CM | POA: Diagnosis not present

## 2019-08-15 DIAGNOSIS — E119 Type 2 diabetes mellitus without complications: Secondary | ICD-10-CM | POA: Diagnosis not present

## 2019-08-15 DIAGNOSIS — E785 Hyperlipidemia, unspecified: Secondary | ICD-10-CM | POA: Diagnosis not present

## 2019-08-17 DIAGNOSIS — Z992 Dependence on renal dialysis: Secondary | ICD-10-CM | POA: Diagnosis not present

## 2019-08-17 DIAGNOSIS — N186 End stage renal disease: Secondary | ICD-10-CM | POA: Diagnosis not present

## 2019-08-18 ENCOUNTER — Telehealth: Payer: Self-pay

## 2019-08-18 DIAGNOSIS — R001 Bradycardia, unspecified: Secondary | ICD-10-CM | POA: Diagnosis not present

## 2019-08-18 DIAGNOSIS — I2510 Atherosclerotic heart disease of native coronary artery without angina pectoris: Secondary | ICD-10-CM | POA: Diagnosis not present

## 2019-08-18 DIAGNOSIS — I5030 Unspecified diastolic (congestive) heart failure: Secondary | ICD-10-CM | POA: Diagnosis not present

## 2019-08-18 DIAGNOSIS — D649 Anemia, unspecified: Secondary | ICD-10-CM | POA: Diagnosis not present

## 2019-08-18 DIAGNOSIS — E039 Hypothyroidism, unspecified: Secondary | ICD-10-CM | POA: Diagnosis not present

## 2019-08-18 DIAGNOSIS — E782 Mixed hyperlipidemia: Secondary | ICD-10-CM | POA: Diagnosis not present

## 2019-08-18 DIAGNOSIS — I480 Paroxysmal atrial fibrillation: Secondary | ICD-10-CM | POA: Diagnosis not present

## 2019-08-18 DIAGNOSIS — I4891 Unspecified atrial fibrillation: Secondary | ICD-10-CM | POA: Diagnosis not present

## 2019-08-18 NOTE — Telephone Encounter (Signed)

## 2019-08-19 ENCOUNTER — Encounter: Payer: Self-pay | Admitting: Cardiology

## 2019-08-19 ENCOUNTER — Encounter: Payer: Medicare HMO | Admitting: Cardiology

## 2019-08-19 DIAGNOSIS — Z992 Dependence on renal dialysis: Secondary | ICD-10-CM | POA: Diagnosis not present

## 2019-08-19 DIAGNOSIS — N186 End stage renal disease: Secondary | ICD-10-CM | POA: Diagnosis not present

## 2019-08-19 NOTE — Progress Notes (Signed)
This encounter was created in error - please disregard.

## 2019-08-21 DIAGNOSIS — R5381 Other malaise: Secondary | ICD-10-CM | POA: Diagnosis not present

## 2019-08-21 DIAGNOSIS — Z5181 Encounter for therapeutic drug level monitoring: Secondary | ICD-10-CM | POA: Diagnosis not present

## 2019-08-21 DIAGNOSIS — Z992 Dependence on renal dialysis: Secondary | ICD-10-CM | POA: Diagnosis not present

## 2019-08-21 DIAGNOSIS — N186 End stage renal disease: Secondary | ICD-10-CM | POA: Diagnosis not present

## 2019-08-21 DIAGNOSIS — Z7901 Long term (current) use of anticoagulants: Secondary | ICD-10-CM | POA: Diagnosis not present

## 2019-08-21 DIAGNOSIS — I482 Chronic atrial fibrillation, unspecified: Secondary | ICD-10-CM | POA: Diagnosis not present

## 2019-08-21 DIAGNOSIS — I480 Paroxysmal atrial fibrillation: Secondary | ICD-10-CM | POA: Diagnosis not present

## 2019-08-24 DIAGNOSIS — N186 End stage renal disease: Secondary | ICD-10-CM | POA: Diagnosis not present

## 2019-08-24 DIAGNOSIS — Z992 Dependence on renal dialysis: Secondary | ICD-10-CM | POA: Diagnosis not present

## 2019-08-25 ENCOUNTER — Other Ambulatory Visit: Payer: Self-pay

## 2019-08-25 ENCOUNTER — Encounter (INDEPENDENT_AMBULATORY_CARE_PROVIDER_SITE_OTHER): Payer: Self-pay | Admitting: Internal Medicine

## 2019-08-25 ENCOUNTER — Ambulatory Visit (INDEPENDENT_AMBULATORY_CARE_PROVIDER_SITE_OTHER): Payer: Medicare HMO | Admitting: Internal Medicine

## 2019-08-25 VITALS — BP 145/70 | HR 73 | Temp 97.9°F

## 2019-08-25 DIAGNOSIS — M545 Low back pain, unspecified: Secondary | ICD-10-CM

## 2019-08-25 DIAGNOSIS — I480 Paroxysmal atrial fibrillation: Secondary | ICD-10-CM | POA: Diagnosis not present

## 2019-08-25 DIAGNOSIS — N184 Chronic kidney disease, stage 4 (severe): Secondary | ICD-10-CM | POA: Diagnosis not present

## 2019-08-25 DIAGNOSIS — E039 Hypothyroidism, unspecified: Secondary | ICD-10-CM

## 2019-08-25 DIAGNOSIS — Z5181 Encounter for therapeutic drug level monitoring: Secondary | ICD-10-CM | POA: Diagnosis not present

## 2019-08-25 DIAGNOSIS — I482 Chronic atrial fibrillation, unspecified: Secondary | ICD-10-CM | POA: Diagnosis not present

## 2019-08-25 DIAGNOSIS — I10 Essential (primary) hypertension: Secondary | ICD-10-CM | POA: Diagnosis not present

## 2019-08-25 DIAGNOSIS — I4821 Permanent atrial fibrillation: Secondary | ICD-10-CM | POA: Diagnosis not present

## 2019-08-25 DIAGNOSIS — G8929 Other chronic pain: Secondary | ICD-10-CM

## 2019-08-25 DIAGNOSIS — Z7901 Long term (current) use of anticoagulants: Secondary | ICD-10-CM | POA: Diagnosis not present

## 2019-08-25 MED ORDER — TRAMADOL HCL 50 MG PO TABS: 50.0000 mg | ORAL_TABLET | Freq: Every day | ORAL | 0 refills | Status: AC | PRN

## 2019-08-25 MED ORDER — WARFARIN SODIUM 4 MG PO TABS: 4.0000 mg | ORAL_TABLET | Freq: Every day | ORAL | 3 refills | Status: AC

## 2019-08-25 NOTE — Progress Notes (Signed)
Metrics: Intervention Frequency ACO  Documented Smoking Status Yearly  Screened one or more times in 24 months  Cessation Counseling or  Active cessation medication Past 24 months  Past 24 months   Guideline developer: UpToDate (See UpToDate for funding source) Date Released: 2014       Wellness Office Visit  Subjective:  Patient ID: [name], female    DOB: Mar 01, 1941  Age: 79 y.o. MRN: [id number]  CC: This lady was recently hospitalized and was discharged on August 12, 2019 with admission for confusional state. HPI  She is now living in a skilled nursing facility I believe.  She is less confused.  It was felt that the confusion was probably due to a combination of factors but opioid medications which have been prescribed previously may have contributed.  She also has quite severe back pain resulting from fracture of the T7 vertebral body.  Approximately 8 days ago, she underwent balloon kyphoplasty at T6 and T7.  Opioids have been discontinued but the patient says she is in pain. It seems also from the medication list that warfarin was discontinued upon discharge from the hospital.  Her INR was 1.5 upon discharge.  She does have paroxysmal atrial fibrillation and was on anticoagulation for this purpose. She continues to undergo hemodialysis for end-stage chronic kidney disease. Past Medical History:  Diagnosis Date  . Anemia   . Arthritis   . Blood transfusion without reported diagnosis   . CAD (coronary artery disease)    a. 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  . Cataract   . Chronic anemia   . Chronic combined systolic and diastolic CHF (congestive heart failure) ([place])    a. 2D echo 08/2016 at [facility]: EF 50-55% with inferior wall HK, impaired LV filling, fair study.  . ESRD on hemodialysis ([place])   . Essential hypertension   . Gastritis   . GERD (gastroesophageal reflux disease)   . Gout   . Headache   . Hyperlipidemia   . Hypothyroidism   .  Iron deficiency anemia 11/08/2016  . NSTEMI (non-ST elevated myocardial infarction) ([place])    a. Complex admission 08/2016 - with severe hyperglycemia, AKI on CKD, severe anemia down to Hgb 6.8, acute combined CHF, troponin of 8.5, cath deferred due to renal dysfunction.  . Paroxysmal atrial fibrillation ([place])   . Type 2 diabetes mellitus ([place])   . Wears dentures       Family History  Problem Relation Age of Onset  . Diabetes Mother   . Asthma Mother   . Early death Mother 50       Pneumonia  . Early death Father        Killed at rode[place]  . CAD Neg Hx   . GI Bleed Neg Hx     Social History   Social History Narrative   Lives with significant other/partner [name]   He is her caregiver   Moved from [facility]      Her Son Jaydn Fincher (lives In [place]) -- ph # [phone] ( would like to be called about care issues)   Social History   Tobacco Use  . Smoking status: Never Smoker  . Smokeless tobacco: Never Used  Substance Use Topics  . Alcohol use: No    Alcohol/week: 0.0 standard drinks    Current Meds  Medication Sig  . acetaminophen (TYLENOL) 325 MG tablet Take 2 tablets (650 mg total) by mouth every 6 (six) hours.  Marland Kitchen amiodarone (PACERONE)  200 MG tablet Take 1 tablet (200 mg total) by mouth daily.  Marland Kitchen aspirin EC 81 MG EC tablet Take 1 tablet (81 mg total) by mouth daily.  . calcium acetate (PHOSLO) 667 MG capsule Take 667-2,001 mg by mouth in the morning, at noon, in the evening, and at bedtime. Take 3 capsules with each meal and 1 capsule with snacks daily  . Cholecalciferol (VITAMIN D-3) 125 MCG (5000 UT) TABS Take 5,000 mg by mouth daily.   . Evolocumab (REPATHA SURECLICK) 062 MG/ML SOAJ Inject 140 mg into the skin every 14 (fourteen) days.  . febuxostat (ULORIC) 40 MG tablet Take 1 tablet by mouth once daily  . Multiple Vitamin (MULTIVITAMIN WITH MINERALS) TABS tablet Take 1 tablet by mouth daily.  Marland Kitchen omeprazole (PRILOSEC) 20 MG capsule Take 20 mg by mouth daily.  . ondansetron  (ZOFRAN) 4 MG tablet TAKE 1 TABLET BY MOUTH EVERY 8 HOURS AS NEEDED FOR NAUSEA AND VOMITING (Patient taking differently: Take 4 mg by mouth every 8 (eight) hours as needed for nausea or vomiting. )  . polyethylene glycol (MIRALAX / GLYCOLAX) 17 g packet Take 17 g by mouth daily as needed for mild constipation.  . senna-docusate (SENOKOT-S) 8.6-50 MG tablet Take 1 tablet by mouth at bedtime.  Marland Kitchen warfarin (COUMADIN) 4 MG tablet Take 1 tablet (4 mg total) by mouth daily at 6 PM.  . [DISCONTINUED] warfarin (COUMADIN) 4 MG tablet Take 1 tablet (4 mg total) by mouth daily at 6 PM.       Objective:   Today's Vitals: BP (!) 145/70 (BP Location: Left Arm, Patient Position: Sitting, Cuff Size: Normal)   Pulse 73   Temp 97.9 F (36.6 C) (Temporal)   SpO2 95%  Vitals with BMI 08/25/2019 08/19/2019 08/14/2019  Height - - -  Weight - 160 lbs 158 lbs 12 oz  BMI - 69.48 54.62  Systolic 703 500 938  Diastolic 70 74 60  Pulse 73 66 78     Physical Exam   She is somewhat miserable today.  Blood pressure reasonable.  Lung fields are clear.  She is in sinus rhythm clinically.  She is alert and oriented.    Assessment   1. Chronic kidney disease, stage IV (severe) ([place])   2. Acquired hypothyroidism   3. Essential hypertension   4. Chronic low back pain without sciatica, unspecified back pain laterality   5. Permanent atrial fibrillation ([place])       Tests ordered Orders Placed This Encounter  Procedures  . CBC  . COMPLETE METABOLIC PANEL WITH GFR  . T3, free  . TSH  . Protime-INR     Plan: 1. Blood work ordered above. 2. I think we need to restart the warfarin 4 mg daily which is the dose she was on previously and I have sent a new prescription to her pharmacy and instructed the nursing home to do this. 3. In terms of her back pain, I am prepared to try her on tramadol to see if this will help but hopefully will not cause confusion.  I have told her I would not prescribe opioids such  as oxycodone that she was on before. 4. She will continue with amiodarone for her atrial fibrillation and she appears to be in sinus rhythm at the present time. 5. She will also continue with desiccated NP thyroid for hypothyroidism and we will check levels today. 6. Further recommendations will depend on the results and she will follow-up with Sarah in about  3 months time.   Meds ordered this encounter  Medications  . warfarin (COUMADIN) 4 MG tablet    Sig: Take 1 tablet (4 mg total) by mouth daily at 6 PM.    Dispense:  30 tablet    Refill:  3  . traMADol (ULTRAM) 50 MG tablet    Sig: Take 1 tablet (50 mg total) by mouth daily as needed for up to 5 days.    Dispense:  30 tablet    Refill:  0    Yuktha Kerchner Luther Parody, MD

## 2019-08-26 ENCOUNTER — Telehealth (INDEPENDENT_AMBULATORY_CARE_PROVIDER_SITE_OTHER): Payer: Self-pay

## 2019-08-26 DIAGNOSIS — N186 End stage renal disease: Secondary | ICD-10-CM | POA: Diagnosis not present

## 2019-08-26 DIAGNOSIS — Z992 Dependence on renal dialysis: Secondary | ICD-10-CM | POA: Diagnosis not present

## 2019-08-26 LAB — COMPLETE METABOLIC PANEL WITH GFR
AG Ratio: 1.5 (calc) (ref 1.0–2.5)
ALT: 6 U/L (ref 6–29)
AST: 17 U/L (ref 10–35)
Albumin: 3.4 g/dL — ABNORMAL LOW (ref 3.6–5.1)
Alkaline phosphatase (APISO): 166 U/L — ABNORMAL HIGH (ref 37–153)
BUN/Creatinine Ratio: 6 (calc) (ref 6–22)
BUN: 27 mg/dL — ABNORMAL HIGH (ref 7–25)
CO2: 28 mmol/L (ref 20–32)
Calcium: 10.8 mg/dL — ABNORMAL HIGH (ref 8.6–10.4)
Chloride: 94 mmol/L — ABNORMAL LOW (ref 98–110)
Creat: 4.16 mg/dL — ABNORMAL HIGH (ref 0.60–0.93)
GFR, Est African American: 11 mL/min/{1.73_m2} — ABNORMAL LOW (ref >=60)
GFR, Est Non African American: 10 mL/min/{1.73_m2} — ABNORMAL LOW (ref >=60)
Globulin: 2.2 g/dL (calc) (ref 1.9–3.7)
Glucose, Bld: 127 mg/dL — ABNORMAL HIGH (ref 65–99)
Potassium: 4 mmol/L (ref 3.5–5.3)
Sodium: 133 mmol/L — ABNORMAL LOW (ref 135–146)
Total Bilirubin: 0.3 mg/dL (ref 0.2–1.2)
Total Protein: 5.6 g/dL — ABNORMAL LOW (ref 6.1–8.1)

## 2019-08-26 LAB — CBC
HCT: 29.3 % — ABNORMAL LOW (ref 35.0–45.0)
Hemoglobin: 9.4 g/dL — ABNORMAL LOW (ref 11.7–15.5)
MCH: 30 pg (ref 27.0–33.0)
MCHC: 32.1 g/dL (ref 32.0–36.0)
MCV: 93.6 fL (ref 80.0–100.0)
MPV: 8.4 fL (ref 7.5–12.5)
Platelets: 336 10*3/uL (ref 140–400)
RBC: 3.13 10*6/uL — ABNORMAL LOW (ref 3.80–5.10)
RDW: 16.5 % — ABNORMAL HIGH (ref 11.0–15.0)
WBC: 7.1 10*3/uL (ref 3.8–10.8)

## 2019-08-26 LAB — PROTIME-INR
INR: 5.9 — ABNORMAL HIGH
Prothrombin Time: 54.9 s — ABNORMAL HIGH (ref 9.0–11.5)

## 2019-08-26 LAB — TSH: TSH: 2.99 mIU/L (ref 0.40–4.50)

## 2019-08-26 LAB — T3, FREE: T3, Free: 2.2 pg/mL — ABNORMAL LOW (ref 2.3–4.2)

## 2019-08-26 NOTE — Progress Notes (Signed)
Patient called. Mr [name] took call and was given lab results and instructions. He will notify nurse at the snf she is at & the Div [facility].

## 2019-08-26 NOTE — Telephone Encounter (Signed)
Tell the patient to not take warfarin as I prescribed yesterday.  Repeat INR in 1 week either at the facility she is living at work she needs to come in to have the blood drawn and then we can see if she can go back on warfarin or not.

## 2019-08-26 NOTE — Telephone Encounter (Signed)
Talk with [name] and he was with nursing staff to help with appointment.

## 2019-08-28 DIAGNOSIS — I480 Paroxysmal atrial fibrillation: Secondary | ICD-10-CM | POA: Diagnosis not present

## 2019-08-28 DIAGNOSIS — Z7901 Long term (current) use of anticoagulants: Secondary | ICD-10-CM | POA: Diagnosis not present

## 2019-08-28 DIAGNOSIS — Z992 Dependence on renal dialysis: Secondary | ICD-10-CM | POA: Diagnosis not present

## 2019-08-28 DIAGNOSIS — Z5181 Encounter for therapeutic drug level monitoring: Secondary | ICD-10-CM | POA: Diagnosis not present

## 2019-08-28 DIAGNOSIS — I482 Chronic atrial fibrillation, unspecified: Secondary | ICD-10-CM | POA: Diagnosis not present

## 2019-08-28 DIAGNOSIS — N186 End stage renal disease: Secondary | ICD-10-CM | POA: Diagnosis not present

## 2019-08-31 ENCOUNTER — Other Ambulatory Visit: Payer: Self-pay

## 2019-08-31 ENCOUNTER — Emergency Department (HOSPITAL_COMMUNITY): Payer: Medicare HMO

## 2019-08-31 ENCOUNTER — Inpatient Hospital Stay (HOSPITAL_COMMUNITY): Payer: Medicare HMO

## 2019-08-31 ENCOUNTER — Inpatient Hospital Stay (HOSPITAL_COMMUNITY)
Admission: EM | Admit: 2019-08-31 | Discharge: 2019-09-26 | DRG: 100 | Disposition: E | Payer: Medicare HMO | Source: Skilled Nursing Facility | Attending: Pulmonary Disease | Admitting: Pulmonary Disease

## 2019-08-31 ENCOUNTER — Encounter (HOSPITAL_COMMUNITY): Payer: Self-pay | Admitting: Emergency Medicine

## 2019-08-31 DIAGNOSIS — E8889 Other specified metabolic disorders: Secondary | ICD-10-CM | POA: Diagnosis present

## 2019-08-31 DIAGNOSIS — I2510 Atherosclerotic heart disease of native coronary artery without angina pectoris: Secondary | ICD-10-CM | POA: Diagnosis present

## 2019-08-31 DIAGNOSIS — I5042 Chronic combined systolic (congestive) and diastolic (congestive) heart failure: Secondary | ICD-10-CM | POA: Diagnosis not present

## 2019-08-31 DIAGNOSIS — E876 Hypokalemia: Secondary | ICD-10-CM | POA: Diagnosis present

## 2019-08-31 DIAGNOSIS — Y92239 Unspecified place in hospital as the place of occurrence of the external cause: Secondary | ICD-10-CM | POA: Diagnosis not present

## 2019-08-31 DIAGNOSIS — I611 Nontraumatic intracerebral hemorrhage in hemisphere, cortical: Secondary | ICD-10-CM | POA: Diagnosis not present

## 2019-08-31 DIAGNOSIS — I169 Hypertensive crisis, unspecified: Secondary | ICD-10-CM | POA: Diagnosis not present

## 2019-08-31 DIAGNOSIS — E875 Hyperkalemia: Secondary | ICD-10-CM | POA: Diagnosis present

## 2019-08-31 DIAGNOSIS — H5509 Other forms of nystagmus: Secondary | ICD-10-CM | POA: Diagnosis present

## 2019-08-31 DIAGNOSIS — J9690 Respiratory failure, unspecified, unspecified whether with hypoxia or hypercapnia: Secondary | ICD-10-CM | POA: Diagnosis not present

## 2019-08-31 DIAGNOSIS — Z79899 Other long term (current) drug therapy: Secondary | ICD-10-CM

## 2019-08-31 DIAGNOSIS — Z96642 Presence of left artificial hip joint: Secondary | ICD-10-CM | POA: Diagnosis present

## 2019-08-31 DIAGNOSIS — D72829 Elevated white blood cell count, unspecified: Secondary | ICD-10-CM | POA: Diagnosis present

## 2019-08-31 DIAGNOSIS — Z452 Encounter for adjustment and management of vascular access device: Secondary | ICD-10-CM | POA: Diagnosis not present

## 2019-08-31 DIAGNOSIS — M109 Gout, unspecified: Secondary | ICD-10-CM | POA: Diagnosis present

## 2019-08-31 DIAGNOSIS — G40901 Epilepsy, unspecified, not intractable, with status epilepticus: Secondary | ICD-10-CM | POA: Diagnosis not present

## 2019-08-31 DIAGNOSIS — G40801 Other epilepsy, not intractable, with status epilepticus: Principal | ICD-10-CM | POA: Diagnosis present

## 2019-08-31 DIAGNOSIS — Z992 Dependence on renal dialysis: Secondary | ICD-10-CM

## 2019-08-31 DIAGNOSIS — G936 Cerebral edema: Secondary | ICD-10-CM | POA: Diagnosis not present

## 2019-08-31 DIAGNOSIS — I4510 Unspecified right bundle-branch block: Secondary | ICD-10-CM | POA: Diagnosis present

## 2019-08-31 DIAGNOSIS — E871 Hypo-osmolality and hyponatremia: Secondary | ICD-10-CM | POA: Diagnosis not present

## 2019-08-31 DIAGNOSIS — G92 Toxic encephalopathy: Secondary | ICD-10-CM | POA: Diagnosis present

## 2019-08-31 DIAGNOSIS — Z66 Do not resuscitate: Secondary | ICD-10-CM | POA: Diagnosis not present

## 2019-08-31 DIAGNOSIS — I161 Hypertensive emergency: Secondary | ICD-10-CM | POA: Diagnosis present

## 2019-08-31 DIAGNOSIS — R4182 Altered mental status, unspecified: Secondary | ICD-10-CM | POA: Diagnosis not present

## 2019-08-31 DIAGNOSIS — T886XXA Anaphylactic reaction due to adverse effect of correct drug or medicament properly administered, initial encounter: Secondary | ICD-10-CM | POA: Diagnosis not present

## 2019-08-31 DIAGNOSIS — D473 Essential (hemorrhagic) thrombocythemia: Secondary | ICD-10-CM | POA: Diagnosis present

## 2019-08-31 DIAGNOSIS — R404 Transient alteration of awareness: Secondary | ICD-10-CM | POA: Diagnosis not present

## 2019-08-31 DIAGNOSIS — Z79890 Hormone replacement therapy (postmenopausal): Secondary | ICD-10-CM

## 2019-08-31 DIAGNOSIS — I4821 Permanent atrial fibrillation: Secondary | ICD-10-CM | POA: Diagnosis not present

## 2019-08-31 DIAGNOSIS — I952 Hypotension due to drugs: Secondary | ICD-10-CM | POA: Diagnosis not present

## 2019-08-31 DIAGNOSIS — E1129 Type 2 diabetes mellitus with other diabetic kidney complication: Secondary | ICD-10-CM | POA: Diagnosis not present

## 2019-08-31 DIAGNOSIS — R4020 Unspecified coma: Secondary | ICD-10-CM | POA: Diagnosis not present

## 2019-08-31 DIAGNOSIS — R400 Somnolence: Secondary | ICD-10-CM | POA: Diagnosis not present

## 2019-08-31 DIAGNOSIS — Z951 Presence of aortocoronary bypass graft: Secondary | ICD-10-CM | POA: Diagnosis not present

## 2019-08-31 DIAGNOSIS — I10 Essential (primary) hypertension: Secondary | ICD-10-CM | POA: Diagnosis not present

## 2019-08-31 DIAGNOSIS — J9811 Atelectasis: Secondary | ICD-10-CM | POA: Diagnosis not present

## 2019-08-31 DIAGNOSIS — E778 Other disorders of glycoprotein metabolism: Secondary | ICD-10-CM | POA: Diagnosis present

## 2019-08-31 DIAGNOSIS — L89312 Pressure ulcer of right buttock, stage 2: Secondary | ICD-10-CM | POA: Diagnosis present

## 2019-08-31 DIAGNOSIS — Z9842 Cataract extraction status, left eye: Secondary | ICD-10-CM

## 2019-08-31 DIAGNOSIS — Z833 Family history of diabetes mellitus: Secondary | ICD-10-CM

## 2019-08-31 DIAGNOSIS — D631 Anemia in chronic kidney disease: Secondary | ICD-10-CM | POA: Diagnosis not present

## 2019-08-31 DIAGNOSIS — Z7901 Long term (current) use of anticoagulants: Secondary | ICD-10-CM

## 2019-08-31 DIAGNOSIS — I700 Atherosclerosis of aorta: Secondary | ICD-10-CM | POA: Diagnosis present

## 2019-08-31 DIAGNOSIS — R569 Unspecified convulsions: Secondary | ICD-10-CM | POA: Diagnosis not present

## 2019-08-31 DIAGNOSIS — Z4682 Encounter for fitting and adjustment of non-vascular catheter: Secondary | ICD-10-CM | POA: Diagnosis not present

## 2019-08-31 DIAGNOSIS — Z888 Allergy status to other drugs, medicaments and biological substances status: Secondary | ICD-10-CM

## 2019-08-31 DIAGNOSIS — R0689 Other abnormalities of breathing: Secondary | ICD-10-CM | POA: Diagnosis not present

## 2019-08-31 DIAGNOSIS — E782 Mixed hyperlipidemia: Secondary | ICD-10-CM | POA: Diagnosis present

## 2019-08-31 DIAGNOSIS — R001 Bradycardia, unspecified: Secondary | ICD-10-CM | POA: Diagnosis not present

## 2019-08-31 DIAGNOSIS — R791 Abnormal coagulation profile: Secondary | ICD-10-CM | POA: Diagnosis present

## 2019-08-31 DIAGNOSIS — N250 Renal osteodystrophy: Secondary | ICD-10-CM | POA: Diagnosis not present

## 2019-08-31 DIAGNOSIS — J9600 Acute respiratory failure, unspecified whether with hypoxia or hypercapnia: Secondary | ICD-10-CM | POA: Diagnosis not present

## 2019-08-31 DIAGNOSIS — I480 Paroxysmal atrial fibrillation: Secondary | ICD-10-CM | POA: Diagnosis present

## 2019-08-31 DIAGNOSIS — I619 Nontraumatic intracerebral hemorrhage, unspecified: Secondary | ICD-10-CM | POA: Diagnosis not present

## 2019-08-31 DIAGNOSIS — T4275XA Adverse effect of unspecified antiepileptic and sedative-hypnotic drugs, initial encounter: Secondary | ICD-10-CM | POA: Diagnosis not present

## 2019-08-31 DIAGNOSIS — K219 Gastro-esophageal reflux disease without esophagitis: Secondary | ICD-10-CM | POA: Diagnosis present

## 2019-08-31 DIAGNOSIS — E8770 Fluid overload, unspecified: Secondary | ICD-10-CM | POA: Diagnosis present

## 2019-08-31 DIAGNOSIS — I132 Hypertensive heart and chronic kidney disease with heart failure and with stage 5 chronic kidney disease, or end stage renal disease: Secondary | ICD-10-CM | POA: Diagnosis not present

## 2019-08-31 DIAGNOSIS — Z8616 Personal history of COVID-19: Secondary | ICD-10-CM

## 2019-08-31 DIAGNOSIS — Z825 Family history of asthma and other chronic lower respiratory diseases: Secondary | ICD-10-CM

## 2019-08-31 DIAGNOSIS — I6783 Posterior reversible encephalopathy syndrome: Secondary | ICD-10-CM | POA: Diagnosis present

## 2019-08-31 DIAGNOSIS — N186 End stage renal disease: Secondary | ICD-10-CM | POA: Diagnosis not present

## 2019-08-31 DIAGNOSIS — E781 Pure hyperglyceridemia: Secondary | ICD-10-CM | POA: Diagnosis not present

## 2019-08-31 DIAGNOSIS — E1122 Type 2 diabetes mellitus with diabetic chronic kidney disease: Secondary | ICD-10-CM | POA: Diagnosis present

## 2019-08-31 DIAGNOSIS — Z7982 Long term (current) use of aspirin: Secondary | ICD-10-CM

## 2019-08-31 DIAGNOSIS — I252 Old myocardial infarction: Secondary | ICD-10-CM

## 2019-08-31 DIAGNOSIS — J90 Pleural effusion, not elsewhere classified: Secondary | ICD-10-CM | POA: Diagnosis not present

## 2019-08-31 DIAGNOSIS — Z86010 Personal history of colonic polyps: Secondary | ICD-10-CM

## 2019-08-31 DIAGNOSIS — E8809 Other disorders of plasma-protein metabolism, not elsewhere classified: Secondary | ICD-10-CM | POA: Diagnosis present

## 2019-08-31 DIAGNOSIS — E039 Hypothyroidism, unspecified: Secondary | ICD-10-CM | POA: Diagnosis present

## 2019-08-31 DIAGNOSIS — R2689 Other abnormalities of gait and mobility: Secondary | ICD-10-CM | POA: Diagnosis not present

## 2019-08-31 DIAGNOSIS — G40911 Epilepsy, unspecified, intractable, with status epilepticus: Secondary | ICD-10-CM | POA: Diagnosis not present

## 2019-08-31 LAB — BASIC METABOLIC PANEL
Anion gap: 11 (ref 5–15)
BUN: 34 mg/dL — ABNORMAL HIGH (ref 8–23)
CO2: 28 mmol/L (ref 22–32)
Calcium: 12.4 mg/dL — ABNORMAL HIGH (ref 8.9–10.3)
Chloride: 95 mmol/L — ABNORMAL LOW (ref 98–111)
Creatinine, Ser: 5.16 mg/dL — ABNORMAL HIGH (ref 0.44–1.00)
GFR calc Af Amer: 9 mL/min — ABNORMAL LOW (ref >60)
GFR calc non Af Amer: 7 mL/min — ABNORMAL LOW (ref >60)
Glucose, Bld: 113 mg/dL — ABNORMAL HIGH (ref 70–99)
Potassium: 4.2 mmol/L (ref 3.5–5.1)
Sodium: 134 mmol/L — ABNORMAL LOW (ref 135–145)

## 2019-08-31 LAB — RESPIRATORY PANEL BY RT PCR (FLU A&B, COVID)
Influenza A by PCR: NEGATIVE
Influenza B by PCR: NEGATIVE
SARS Coronavirus 2 by RT PCR: NEGATIVE

## 2019-08-31 LAB — PROTIME-INR
INR: 1.5 — ABNORMAL HIGH (ref 0.8–1.2)
Prothrombin Time: 18 seconds — ABNORMAL HIGH (ref 11.4–15.2)

## 2019-08-31 LAB — GLUCOSE, CAPILLARY
Glucose-Capillary: 106 mg/dL — ABNORMAL HIGH (ref 70–99)
Glucose-Capillary: 102 mg/dL — ABNORMAL HIGH (ref 70–99)
Glucose-Capillary: 119 mg/dL — ABNORMAL HIGH (ref 70–99)
Glucose-Capillary: 161 mg/dL — ABNORMAL HIGH (ref 70–99)

## 2019-08-31 LAB — BLOOD GAS, ARTERIAL
Acid-Base Excess: 4.4 mmol/L — ABNORMAL HIGH (ref 0.0–2.0)
Bicarbonate: 28.7 mmol/L — ABNORMAL HIGH (ref 20.0–28.0)
FIO2: 40
O2 Saturation: 99.1 %
Patient temperature: 37
pCO2 arterial: 35.3 mmHg (ref 32.0–48.0)
pH, Arterial: 7.505 — ABNORMAL HIGH (ref 7.350–7.450)
pO2, Arterial: 127 mmHg — ABNORMAL HIGH (ref 83.0–108.0)

## 2019-08-31 LAB — CBC
HCT: 36.7 % (ref 36.0–46.0)
Hemoglobin: 11.1 g/dL — ABNORMAL LOW (ref 12.0–15.0)
MCH: 30.8 pg (ref 26.0–34.0)
MCHC: 30.2 g/dL (ref 30.0–36.0)
MCV: 101.9 fL — ABNORMAL HIGH (ref 80.0–100.0)
Platelets: 386 10*3/uL (ref 150–400)
RBC: 3.6 MIL/uL — ABNORMAL LOW (ref 3.87–5.11)
RDW: 18.6 % — ABNORMAL HIGH (ref 11.5–15.5)
WBC: 8.2 10*3/uL (ref 4.0–10.5)
nRBC: 0 % (ref 0.0–0.2)

## 2019-08-31 LAB — PHENYTOIN LEVEL, TOTAL: Phenytoin Lvl: 16.6 ug/mL (ref 10.0–20.0)

## 2019-08-31 LAB — TROPONIN I (HIGH SENSITIVITY)
Troponin I (High Sensitivity): 63 ng/L — ABNORMAL HIGH (ref <18)
Troponin I (High Sensitivity): 62 ng/L — ABNORMAL HIGH (ref <18)

## 2019-08-31 LAB — MRSA PCR SCREENING: MRSA by PCR: NEGATIVE

## 2019-08-31 MED ORDER — SODIUM CHLORIDE 0.9 % IV SOLN
20.0000 mg/kg | Freq: Once | INTRAVENOUS | Status: AC
  Administered 2019-08-31: 1452 mg via INTRAVENOUS
  Filled 2019-08-31: qty 29.04

## 2019-08-31 MED ORDER — LEVETIRACETAM IN NACL 1500 MG/100ML IV SOLN
1500.0000 mg | INTRAVENOUS | Status: AC
  Administered 2019-08-31: 1500 mg via INTRAVENOUS
  Filled 2019-08-31: qty 100

## 2019-08-31 MED ORDER — MIDAZOLAM 50MG/50ML (1MG/ML) PREMIX INFUSION
0.0000 mg/h | INTRAVENOUS | Status: DC
  Administered 2019-08-31: 5 mg/h via INTRAVENOUS

## 2019-08-31 MED ORDER — ETOMIDATE 2 MG/ML IV SOLN
INTRAVENOUS | Status: AC | PRN
  Administered 2019-08-31: 10 mg via INTRAVENOUS

## 2019-08-31 MED ORDER — SODIUM CHLORIDE 0.9% FLUSH: 10.0000 mL | INTRAVENOUS | Status: DC | PRN

## 2019-08-31 MED ORDER — MIDAZOLAM 50MG/50ML (1MG/ML) PREMIX INFUSION
30.0000 mg/h | INTRAVENOUS | Status: DC
  Administered 2019-08-31: 20 mg/h via INTRAVENOUS
  Administered 2019-09-01 (×3): 30 mg/h via INTRAVENOUS
  Filled 2019-08-31: qty 100
  Filled 2019-08-31 (×3): qty 50

## 2019-08-31 MED ORDER — PROPOFOL 1000 MG/100ML IV EMUL
40.0000 ug/kg/min | INTRAVENOUS | Status: DC
  Administered 2019-08-31: 5 ug/kg/min via INTRAVENOUS
  Administered 2019-08-31: 30 ug/kg/min via INTRAVENOUS
  Administered 2019-09-01 (×5): 70 ug/kg/min via INTRAVENOUS
  Administered 2019-09-01: 60 ug/kg/min via INTRAVENOUS
  Administered 2019-09-02: 80 ug/kg/min via INTRAVENOUS
  Administered 2019-09-02: 40 ug/kg/min via INTRAVENOUS
  Administered 2019-09-02: 90 ug/kg/min via INTRAVENOUS
  Administered 2019-09-02: 80 ug/kg/min via INTRAVENOUS
  Administered 2019-09-02: 60 ug/kg/min via INTRAVENOUS
  Administered 2019-09-02: 40 ug/kg/min via INTRAVENOUS
  Administered 2019-09-02: 60 ug/kg/min via INTRAVENOUS
  Administered 2019-09-03: 40 ug/kg/min via INTRAVENOUS
  Filled 2019-08-31 (×10): qty 100
  Filled 2019-08-31: qty 200
  Filled 2019-08-31 (×2): qty 100
  Filled 2019-08-31: qty 300
  Filled 2019-08-31: qty 100

## 2019-08-31 MED ORDER — SODIUM CHLORIDE 0.9% FLUSH
10.0000 mL | Freq: Two times a day (BID) | INTRAVENOUS | Status: DC
  Administered 2019-08-31 – 2019-09-03 (×5): 10 mL

## 2019-08-31 MED ORDER — PHENYTOIN 125 MG/5ML PO SUSP: 100.0000 mg | Freq: Three times a day (TID) | ORAL | Status: DC

## 2019-08-31 MED ORDER — LORAZEPAM 2 MG/ML IJ SOLN
INTRAMUSCULAR | Status: AC
  Filled 2019-08-31: qty 2

## 2019-08-31 MED ORDER — MIDAZOLAM 50MG/50ML (1MG/ML) PREMIX INFUSION
0.5000 mg/h | INTRAVENOUS | Status: DC
  Administered 2019-08-31: 0.5 mg/h via INTRAVENOUS
  Filled 2019-08-31: qty 50

## 2019-08-31 MED ORDER — LEVETIRACETAM IN NACL 500 MG/100ML IV SOLN
500.0000 mg | INTRAVENOUS | Status: AC
  Administered 2019-09-01: 500 mg via INTRAVENOUS
  Filled 2019-08-31: qty 100

## 2019-08-31 MED ORDER — WARFARIN SODIUM 2.5 MG PO TABS
2.5000 mg | ORAL_TABLET | Freq: Once | ORAL | Status: AC
  Administered 2019-08-31: 2.5 mg
  Filled 2019-08-31: qty 1

## 2019-08-31 MED ORDER — THYROID 120 MG PO TABS
120.0000 mg | ORAL_TABLET | Freq: Every day | ORAL | Status: DC
  Administered 2019-09-01 – 2019-09-03 (×3): 120 mg
  Filled 2019-08-31 (×4): qty 1

## 2019-08-31 MED ORDER — PHENYTOIN SODIUM 50 MG/ML IJ SOLN: 100.0000 mg | Freq: Three times a day (TID) | INTRAMUSCULAR | Status: DC

## 2019-08-31 MED ORDER — SUCCINYLCHOLINE CHLORIDE 20 MG/ML IJ SOLN
INTRAMUSCULAR | Status: AC | PRN
  Administered 2019-08-31: 100 mg via INTRAVENOUS

## 2019-08-31 MED ORDER — ORAL CARE MOUTH RINSE
15.0000 mL | OROMUCOSAL | Status: DC
  Administered 2019-08-31 – 2019-09-03 (×33): 15 mL via OROMUCOSAL

## 2019-08-31 MED ORDER — FENTANYL CITRATE (PF) 100 MCG/2ML IJ SOLN: 25.0000 ug | INTRAMUSCULAR | Status: DC | PRN

## 2019-08-31 MED ORDER — PHENOBARBITAL SODIUM 130 MG/ML IJ SOLN
1450.0000 mg | Freq: Once | INTRAMUSCULAR | Status: AC
  Administered 2019-08-31: 1450 mg via INTRAVENOUS
  Filled 2019-08-31: qty 11.15

## 2019-08-31 MED ORDER — FAMOTIDINE IN NACL 20-0.9 MG/50ML-% IV SOLN
20.0000 mg | Freq: Every day | INTRAVENOUS | Status: DC
  Administered 2019-08-31: 20 mg via INTRAVENOUS
  Filled 2019-08-31 (×2): qty 50

## 2019-08-31 MED ORDER — LORAZEPAM 2 MG/ML IJ SOLN: 1.0000 mg | Freq: Once | INTRAMUSCULAR | Status: AC

## 2019-08-31 MED ORDER — PHENOBARBITAL NICU INJ SYRINGE 65 MG/ML: 20.0000 mg/kg | INJECTION | Freq: Once | INTRAMUSCULAR | Status: DC

## 2019-08-31 MED ORDER — CHLORHEXIDINE GLUCONATE CLOTH 2 % EX PADS
6.0000 | MEDICATED_PAD | Freq: Every day | CUTANEOUS | Status: DC
  Administered 2019-09-02: 6 via TOPICAL

## 2019-08-31 MED ORDER — WARFARIN SODIUM 2.5 MG PO TABS
2.5000 mg | ORAL_TABLET | Freq: Once | ORAL | Status: DC
  Filled 2019-08-31: qty 1

## 2019-08-31 MED ORDER — LEVETIRACETAM IN NACL 500 MG/100ML IV SOLN: 500.0000 mg | Freq: Two times a day (BID) | INTRAVENOUS | Status: DC

## 2019-08-31 MED ORDER — NICARDIPINE HCL IN NACL 20-0.86 MG/200ML-% IV SOLN
0.0000 mg/h | INTRAVENOUS | Status: DC
  Administered 2019-08-31 (×2): 5 mg/h via INTRAVENOUS
  Filled 2019-08-31 (×2): qty 200

## 2019-08-31 MED ORDER — INSULIN ASPART 100 UNIT/ML ~~LOC~~ SOLN
0.0000 [IU] | SUBCUTANEOUS | Status: DC
  Administered 2019-09-01 (×2): 2 [IU] via SUBCUTANEOUS
  Administered 2019-09-03 (×2): 1 [IU] via SUBCUTANEOUS

## 2019-08-31 MED ORDER — LORAZEPAM 2 MG/ML IJ SOLN
4.0000 mg | INTRAMUSCULAR | Status: AC
  Administered 2019-08-31: 4 mg via INTRAVENOUS

## 2019-08-31 MED ORDER — LEVETIRACETAM IN NACL 1000 MG/100ML IV SOLN
1000.0000 mg | Freq: Two times a day (BID) | INTRAVENOUS | Status: DC
  Administered 2019-08-31 – 2019-09-03 (×6): 1000 mg via INTRAVENOUS
  Filled 2019-08-31 (×6): qty 100

## 2019-08-31 MED ORDER — AMIODARONE HCL 200 MG PO TABS
200.0000 mg | ORAL_TABLET | Freq: Every day | ORAL | Status: DC
  Administered 2019-08-31: 200 mg
  Filled 2019-08-31: qty 1

## 2019-08-31 MED ORDER — LEVETIRACETAM IN NACL 1000 MG/100ML IV SOLN
1000.0000 mg | Freq: Once | INTRAVENOUS | Status: AC
  Administered 2019-08-31: 1000 mg via INTRAVENOUS
  Filled 2019-08-31: qty 100

## 2019-08-31 MED ORDER — LORAZEPAM 2 MG/ML IJ SOLN
INTRAMUSCULAR | Status: AC
  Administered 2019-08-31: 1 mg via INTRAVENOUS
  Filled 2019-08-31: qty 1

## 2019-08-31 MED ORDER — CHLORHEXIDINE GLUCONATE 0.12% ORAL RINSE (MEDLINE KIT)
15.0000 mL | Freq: Two times a day (BID) | OROMUCOSAL | Status: DC
  Administered 2019-08-31 – 2019-09-03 (×6): 15 mL via OROMUCOSAL

## 2019-08-31 MED ORDER — NOREPINEPHRINE 4 MG/250ML-% IV SOLN
0.0000 ug/min | INTRAVENOUS | Status: DC
  Administered 2019-08-31 – 2019-09-01 (×2): 2 ug/min via INTRAVENOUS
  Administered 2019-09-03: 5 ug/min via INTRAVENOUS
  Filled 2019-08-31 (×4): qty 250

## 2019-08-31 MED ORDER — ATROPINE SULFATE 1 MG/ML IJ SOLN
1.0000 mg | Freq: Once | INTRAMUSCULAR | Status: AC
  Administered 2019-09-03: 1 mg via INTRAVENOUS
  Filled 2019-08-31 (×2): qty 1

## 2019-08-31 MED ORDER — ATROPINE SULFATE 1 MG/10ML IJ SOSY
PREFILLED_SYRINGE | INTRAMUSCULAR | Status: AC
  Filled 2019-08-31: qty 10

## 2019-08-31 MED ORDER — DOPAMINE-DEXTROSE 3.2-5 MG/ML-% IV SOLN
0.0000 ug/kg/min | INTRAVENOUS | Status: DC
  Administered 2019-08-31: 5 ug/kg/min via INTRAVENOUS
  Administered 2019-09-01: 15 ug/kg/min via INTRAVENOUS
  Administered 2019-09-02: 12 ug/kg/min via INTRAVENOUS
  Administered 2019-09-02: 15 ug/kg/min via INTRAVENOUS
  Administered 2019-09-03: 12 ug/kg/min via INTRAVENOUS
  Filled 2019-08-31 (×5): qty 250

## 2019-08-31 MED ORDER — WARFARIN - PHARMACIST DOSING INPATIENT: Freq: Every day | Status: DC

## 2019-08-31 MED ORDER — VALPROIC ACID 250 MG/5ML PO SOLN
1400.0000 mg | Freq: Once | ORAL | Status: AC
  Administered 2019-08-31: 1400 mg via ORAL
  Filled 2019-08-31: qty 28

## 2019-08-31 MED ORDER — MIDAZOLAM BOLUS VIA INFUSION
1.0000 mg | INTRAVENOUS | Status: DC | PRN
  Filled 2019-08-31: qty 2

## 2019-08-31 NOTE — ED Notes (Signed)
[facility] at bedside 

## 2019-08-31 NOTE — Progress Notes (Signed)
After patient received loading dose of fosphenytoin IVBP her HR dropped into the 30s. Her rhythm remained in sinus brady without any arrhythmias noted. HR increased to the 40s and remained there. Dr Hortense Ramal was notified. No new orders were placed.   Dr Vaughan Browner was notified of patient's HR sustaining in the 12s. Central line was placed along with order's for levo gtt if needed. Will continue to monitor patient at this time.

## 2019-08-31 NOTE — ED Notes (Signed)
Pt's son, Liliane Channel, was updated on pt's plan of care via telephone. Liliane Channel reports she does not have POA but that he is her next of kin. He is currently in [place], [place] and said that pt has a caregiver in town by the name of Shirlean Schlein who he gives consent for him to make any medical decisions for pt since he does not live nearby.

## 2019-08-31 NOTE — Procedures (Signed)
Central Venous Catheter Insertion Procedure Note ANIAH PAULI [id number] 1941-02-16  Procedure: Insertion of Central Venous Catheter Indications: Assessment of intravascular volume and Drug and/or fluid administration  Procedure Details Consent: Risks of procedure as well as the alternatives and risks of each were explained to the (patient/caregiver).  Consent for procedure obtained. Time Out: Verified patient identification, verified procedure, site/side was marked, verified correct patient position, special equipment/implants available, medications/allergies/relevent history reviewed, required imaging and test results available.  Performed  Maximum sterile technique was used including antiseptics, cap, gloves, gown, hand hygiene, mask and sheet. Skin prep: Chlorhexidine; local anesthetic administered A antimicrobial bonded/coated triple lumen catheter was placed in the left internal jugular vein using the Seldinger technique.  Evaluation Blood flow good Complications: No apparent complications Patient did tolerate procedure well. Chest X-ray ordered to verify placement.  CXR: pending.  Marshell Garfinkel MD [facility] Please see [email or web address] for pager details.  09/12/2019, 6:32 PM

## 2019-08-31 NOTE — Progress Notes (Signed)
ANTICOAGULATION CONSULT NOTE - Initial Consult  Pharmacy Consult for warfarin Indication: atrial fibrillation  Allergies  Allergen Reactions  . Insulin Glargine Swelling    "Makes me swell like a balloon all over", including face, but without any respiratory distress or rashes. Associated with weight gain.  . Statins Other (See Comments)    "I've tried them all; my muscle aches were so bad I couldn't walk".  . Other     Patient Measurements: Height: 5\' 5"  (165.1 cm) Weight: 72.6 kg (160 lb) IBW/kg (Calculated) : 57  Vital Signs: Temp: 97.9 F (36.6 C) (04/05 0149) Temp Source: Oral (04/05 0149) BP: 131/65 (04/05 1148) Pulse Rate: 65 (04/05 1030)  Labs: Recent Labs    09/16/2019 0147 08/30/2019 0348  HGB 11.1*  --   HCT 36.7  --   PLT 386  --   LABPROT 18.0*  --   INR 1.5*  --   CREATININE 5.16*  --   TROPONINIHS 63* 62*    Estimated Creatinine Clearance: 8.8 mL/min (A) (by C-G formula based on SCr of 5.16 mg/dL (H)).   Medical History: Past Medical History:  Diagnosis Date  . Anemia   . Arthritis   . Blood transfusion without reported diagnosis   . CAD (coronary artery disease)    a. 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  . Cataract   . Chronic anemia   . Chronic combined systolic and diastolic CHF (congestive heart failure) ([place])    a. 2D echo 08/2016 at [facility]: EF 50-55% with inferior wall HK, impaired LV filling, fair study.  . ESRD on hemodialysis ([place])   . Essential hypertension   . Gastritis   . GERD (gastroesophageal reflux disease)   . Gout   . Headache   . Hyperlipidemia   . Hypothyroidism   . Iron deficiency anemia 11/08/2016  . NSTEMI (non-ST elevated myocardial infarction) ([place])    a. Complex admission 08/2016 - with severe hyperglycemia, AKI on CKD, severe anemia down to Hgb 6.8, acute combined CHF, troponin of 8.5, cath deferred due to renal dysfunction.  . Paroxysmal atrial fibrillation ([place])   . Type 2 diabetes  mellitus ([place])   . Wears dentures      Assessment: 79 yo female admitted on 09/10/2019 with altered mental status, rhythmic jerking of L upper extremity and concern of PRES based on CT head. No hemorrhage noted on CT head. Patient is on warfarin prior to admission for Afib. Pharmacy consulted to dose warfarin. INR 1.5 on admission is suptherapeutic.   Prior to admission warfarin 4mg  daily per chart notes though appears patient was holding warfarin since 3/31 due to supratherapeutic INR of 5.9. Confirmed with [facility] in [place] that patient was not currently taking warfarin.   Goal of Therapy:  INR 2-3 Monitor platelets by anticoagulation protocol: Yes   Plan:  Warfarin 2.5mg  x1 tonight  Monitor INR, CBC, and S/S of bleeding daily   Cristela Felt, PharmD PGY1 Pharmacy Resident [employer]: [phone]   09/24/2019,12:01 PM

## 2019-08-31 NOTE — Progress Notes (Signed)
eLink Physician-Brief Progress Note Patient Name: [name] DOB: [birth year] MRN: [id number]   Date of Service  09/10/2019  HPI/Events of Note  Sinus bradycardia - HR = 40.   eICU Interventions  Will order: 1. Dopamine IV infusion. Titrate to HR > 60.      Intervention Category Major Interventions: Arrhythmia - evaluation and management  Santia Labate [place] 09/11/2019, 10:15 PM

## 2019-08-31 NOTE — Progress Notes (Signed)
Attending note: I have seen and examined the patient. History, labs and imaging reviewed.  79 year old with multiple chronic medical issues including end-stage renal disease, A. fib, coronary artery disease, diabetes admitted with hypertensive emergency, findings on CT concerning for PRESS and altered mental status requiring intubation at [facility], ED.  She had seizure-like activity in the ED.  Transfer to [facility] for further management  Blood pressure 126/63, pulse 65, temperature 97.9 F (36.6 C), temperature source Oral, resp. rate 14, height 5\' 5"  (1.651 m), weight 72.6 kg, SpO2 100 %. Gen:      Elderly HEENT:  EOMI, sclera anicteric Neck:     No masses; no thyromegaly, ETT Lungs:    Clear to auscultation bilaterally; normal respiratory effort CV:         Regular rate and rhythm; no murmurs Abd:      + bowel sounds; soft, non-tender; no palpable masses, no distension Ext:    No edema; adequate peripheral perfusion Skin:      Warm and dry; no rash Neuro: Unresponsive  Labs/Imaging personally reviewed, significant for BUN/creatinine 34/5.16, troponin 63.2, platelets 386 CT head 09/14/2019-findings concerning for PRESS. Chest x-ray 4/5-no acute cardiopulmonary disease.  Assessment/plan:  Acute encephalopathy secondary to hypertensive emergency/PRESS Possible seizure activity Consult neurology, check EEG, MRI Blood pressure control.  She is currently off nicardipine drip.  Atrial fibrillation Continue warfarin, amiodarone Telemetry monitoring   End-stage renal disease Nephrology consulted  The patient is critically ill with multiple organ systems failure and requires high complexity decision making for assessment and support, frequent evaluation and titration of therapies, application of advanced monitoring technologies and extensive interpretation of multiple databases.  Critical care time - 35 mins. This represents my time independent of the NPs time taking care of the  pt.  Marshell Garfinkel MD [facility] 09/02/2019, 11:05 AM

## 2019-08-31 NOTE — Consult Note (Signed)
Neurology Consultation  Reason for Consult: Posterior reversible encephalopathy syndrome Referring Physician: Mannam  CC: Seizure and posterior reversible encephalopathy syndrome  History is obtained from: Chart  HPI: [name] is a 79 y.o. female who was brought to the emergency department secondary to nursing facility noting she had altered mental status and periods of unresponsiveness early this a.m.  EMS was called and patient was transported to [facility] emergency department where she was found to be significantly hypertensive along with having rhythmic jerking of her left upper extremity and some purposeful movement of her right upper extremity.  Patient was given IV Ativan for possible seizure and then intubated for airway protection.  Patient's blood pressure initially ranged between 211/106 and 207/98.  CT of head was obtained and showed what appeared to be PRES. nicardipine infusion for her hypertension was started and patient was transferred to [facility].  EEG was obtained showing patient to be in status epilepticus.  At that time Ativan was given followed by a loading dose of Keppra and maintenance dose of Keppra.  ED course  CT head shows-hypoattenuation within the bilateral occipital white matter which was new compared to 03/08/2017 and may be a manifestation of posterior reversible encephalopathy syndrome.  Chart review (no prior neurology notes)  Work up that has been done: CT of head to evaluate for possible stroke Blood pressure management secondary to hypertensive urgency   Past Medical History:  Diagnosis Date  . Anemia   . Arthritis   . Blood transfusion without reported diagnosis   . CAD (coronary artery disease)    a. 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  . Cataract   . Chronic anemia   . Chronic combined systolic and diastolic CHF (congestive heart failure) ([place])    a. 2D echo 08/2016 at [facility]: EF 50-55% with inferior  wall HK, impaired LV filling, fair study.  . ESRD on hemodialysis ([place])   . Essential hypertension   . Gastritis   . GERD (gastroesophageal reflux disease)   . Gout   . Headache   . Hyperlipidemia   . Hypothyroidism   . Iron deficiency anemia 11/08/2016  . NSTEMI (non-ST elevated myocardial infarction) ([place])    a. Complex admission 08/2016 - with severe hyperglycemia, AKI on CKD, severe anemia down to Hgb 6.8, acute combined CHF, troponin of 8.5, cath deferred due to renal dysfunction.  . Paroxysmal atrial fibrillation ([place])   . Type 2 diabetes mellitus ([place])   . Wears dentures     Family History  Problem Relation Age of Onset  . Diabetes Mother   . Asthma Mother   . Early death Mother 49       Pneumonia  . Early death Father        Killed at rodeo  . CAD Neg Hx   . GI Bleed Neg Hx      Social History:   reports that she has never smoked. She has never used smokeless tobacco. She reports that she does not drink alcohol or use drugs.  Medications  Current Facility-Administered Medications:  .  amiodarone (PACERONE) tablet 200 mg, 200 mg, Per Tube, Daily, Corey Harold, NP .  fentaNYL (SUBLIMAZE) injection 25 mcg, 25 mcg, Intravenous, Q15 min PRN, Corey Harold, NP .  fentaNYL (SUBLIMAZE) injection 25-100 mcg, 25-100 mcg, Intravenous, Q30 min PRN, Corey Harold, NP .  insulin aspart (novoLOG) injection 0-9 Units, 0-9 Units, Subcutaneous, Q4H, Corey Harold, NP .  LORazepam (ATIVAN) 2 MG/ML injection, , , ,  .  nicardipine (CARDENE) 20mg  in 0.86% saline 250ml IV infusion (0.1 mg/ml), 0-15 mg/hr, Intravenous, Continuous, Tomi Bamberger, Iva, MD, Last Rate: 50 mL/hr at 09/11/2019 0747, 5 mg/hr at 09/21/2019 0747 .  propofol (DIPRIVAN) 1000 MG/100ML infusion, 0-50 mcg/kg/min, Intravenous, Continuous, Corey Harold, NP, Last Rate: 2.18 mL/hr at 09/01/2019 1305, 5 mcg/kg/min at 09/13/2019 1305 .  [START ON 09/01/2019] thyroid (ARMOUR) tablet 120 mg, 120 mg, Per Tube, QAC breakfast, Corey Harold, NP .  warfarin (COUMADIN) tablet 2.5 mg, 2.5 mg, Oral, ONCE-1600, Henri Medal, RPH .  Warfarin - Pharmacist Dosing Inpatient, , Does not apply, q1600, Henri Medal, RPH  ROS:  Unable to obtain due to sedation and intubated  Exam: Current vital signs: BP 130/68   Pulse 61   Temp (!) 97.5 F (36.4 C) (Axillary)   Resp 14   Ht 5\' 5"  (1.651 m)   Wt 72.6 kg   SpO2 100%   BMI 26.63 kg/m  Vital signs in last 24 hours: Temp:  [97.5 F (36.4 C)-97.9 F (36.6 C)] 97.5 F (36.4 C) (04/05 1200) Pulse Rate:  [61-148] 61 (04/05 1200) Resp:  [8-31] 14 (04/05 1200) BP: (111-211)/(53-155) 130/68 (04/05 1200) SpO2:  [89 %-100 %] 100 % (04/05 1200) FiO2 (%):  [40 %] 40 % (04/05 1148) Weight:  [72.6 kg] 72.6 kg (04/05 0141)   Constitutional: Appears well-developed and well-nourished.  Eyes: No scleral injection HENT: No OP obstrucion Head: Normocephalic.  Cardiovascular: Normal rate and regular rhythm.  Respiratory: Effort normal, non-labored breathing GI: Soft.  No distension. There is no tenderness.  Skin: WDI  Neuro: Mental Status: Patient is intubated, not breathing over the vent, only responds to noxious stimuli Cranial Nerves: II: No blink to threat III,IV, VI: 1 mm bilaterally and nonreactive  V: Facial sensation is intact to noxious stimuli VII: Facial appears symmetric.  VIII: No response to voice   Motor: Patient does wince and slightly withdraw from noxious stimuli Sensory: As above she winces and slightly withdraws from noxious stimuli  Deep Tendon Reflexes: Symmetric in the biceps and patellae.  Plantars: Toes are downgoing bilaterally.    Labs I have reviewed labs in epic and the results pertinent to this consultation are:   CBC    Component Value Date/Time   WBC 8.2 09/20/2019 0147   RBC 3.60 (L) 09/19/2019 0147   HGB 11.1 (L) 09/12/2019 0147   HCT 36.7 09/19/2019 0147   PLT 386 09/05/2019 0147   MCV 101.9 (H) 09/22/2019 0147   MCH 30.8  09/25/2019 0147   MCHC 30.2 09/24/2019 0147   RDW 18.6 (H) 08/28/2019 0147   LYMPHSABS 0.5 (L) 08/03/2019 1120   MONOABS 0.5 08/03/2019 1120   EOSABS 0.0 08/03/2019 1120   BASOSABS 0.0 08/03/2019 1120    CMP     Component Value Date/Time   NA 134 (L) 09/20/2019 0147   K 4.2 09/15/2019 0147   CL 95 (L) 08/30/2019 0147   CO2 28 09/22/2019 0147   GLUCOSE 113 (H) 09/23/2019 0147   BUN 34 (H) 09/11/2019 0147   CREATININE 5.16 (H) 09/07/2019 0147   CREATININE 4.16 (H) 08/25/2019 1334   CALCIUM 12.4 (H) 08/28/2019 0147   CALCIUM 8.6 (L) 03/28/2018 1207   PROT 5.6 (L) 08/25/2019 1334   ALBUMIN 3.0 (L) 08/13/2019 0638   AST 17 08/25/2019 1334   ALT 6 08/25/2019 1334   ALKPHOS 82 06/24/2019 2259   BILITOT 0.3  08/25/2019 1334   GFRNONAA 7 (L) 09/18/2019 0147   GFRNONAA 10 (L) 08/25/2019 1334   GFRAA 9 (L) 09/10/2019 0147   GFRAA 11 (L) 08/25/2019 1334    Lipid Panel     Component Value Date/Time   CHOL 340 (H) 04/28/2019 0909   TRIG 271 (H) 05/19/2019 1134   HDL 48 04/28/2019 0909   CHOLHDL 7.1 04/28/2019 0909   VLDL 75 (H) 04/28/2019 0909   LDLCALC 217 (H) 04/28/2019 0909   LDLDIRECT 215.4 (H) 04/28/2019 0909     Imaging I have reviewed the images obtained:  CT head shows-hypoattenuation within the bilateral occipital white matter which was new compared to 03/08/2017 and may be a manifestation of posterior reversible encephalopathy syndrome.   Etta Quill PA-C [facility] [phone]  M-F  (9:00 am- 5:00 PM)  09/23/2019, 1:10 PM     Assessment:  This is a 79 year old female with posterior reversible encephalopathy syndrome and possible seizure requiring intubation after receiving Ativan.  Patient transferred to [facility] with nicardipine infusion currently blood pressure is well controlled. Stat EEG was obtained-patient appears to be in nonconvulsive status epilepticus.  Patient loaded with Keppra 1.5 g and start on  propofol.   Impression: -Altered mental status -Posterior reversible encephalopathy syndrome -Hypertensive urgency now controlled with IV antihypertensives --Nonconvulsive status epilepticus  Recommendations: - LTM EEG mornitoring -Continue blood pressure control with goal blood pressure less than 825 systolically and less than 90 diastolically -Continue Keppra 500 mg twice daily IV with extra dose of 500 mg IV after hemodialysis -Seizure precautions -MRI brain when feasible  Patient continues to be in status epilepticus, will load with fosphenytoin and increase propofol rate to 40 mcg/kg/min. continue Versed drip at 3 mg/hr   NEUROHOSPITALIST ADDENDUM Performed a face to face diagnostic evaluation.   I have reviewed the contents of history and physical exam as documented by PA/ARNP/Resident and agree with above documentation.  I have discussed and formulated the above plan as documented. Edits to the note have been made as needed.   79 year old female transferred from nursing facility after being altered, blood pressure in the range greater than 053 systolic.  CT head showed bilateral occipital white matter hypodensities concerning for PR ES.  There is also some concern for seizure activity with rhythmic jerking of the left upper extremity and patient received 1 mg of Ativan.  She became obtunded and intubated for airway protection.  Transferred to [facility] ICU for further management.  Currently blood pressure in the 976B-341P systolic.  She is on Cardene drip.  On examination, patient has occasional rotatory nystagmus.  Exam performed on sedated patient-pupils pinpoint, corneals present.  Gag present.  Withdraws in bilateral upper extremities.  EEG concerning for nonconvulsive status epilepticus arising from bilateral posterior quadrants. Shows generalized polyspikes lasting 2 to 10 seconds.  Will load with Keppra 1.5 g and start 500 mg twice daily.  Addendum: Continues to be in  status, will load with fosphenytoin 20 mg/Kg.  We will also increase propofol.   CRITICAL CARE Performed by: Lanice Schwab Giomar Gusler   Total critical care time: 45  minutes  Critical care time was exclusive of separately billable procedures and treating other patients.  Critical care was necessary to treat or prevent imminent or life-threatening deterioration.  Critical care was time spent personally by me on the following activities: development of treatment plan with patient and/or surrogate as well as nursing, discussions with consultants, evaluation of patient's response to treatment, examination of patient, obtaining  history from patient or surrogate, ordering and performing treatments and interventions, ordering and review of laboratory studies, ordering and review of radiographic studies, pulse oximetry and re-evaluation of patient's condition.   Karena Addison Joevon Holliman MD [facility] [phone]   If 7pm to 7am, please call on call as listed on AMION.

## 2019-08-31 NOTE — Procedures (Signed)
Patient Name: [name]  MRN: [id number]  Epilepsy Attending: Lora Havens  Referring Physician/Provider: Georgann Housekeeper, NP Date: 09/19/2019 Duration: 26.07 minutes  Patient history: 79 year old female noted to have altered mental status.  In the ED noted a rhythmic jerking of left upper extremity.  CT head concerning for PRES. EEG to evaluate for seizures.  Level of alertness: Comatose  AEDs during EEG study: Ativan  Technical aspects: This EEG study was done with scalp electrodes positioned according to the 10-20 International system of electrode placement. Electrical activity was acquired at a sampling rate of 500Hz  and reviewed with a high frequency filter of 70Hz  and a low frequency filter of 1Hz . EEG data were recorded continuously and digitally stored.   Description: EEG showed bilateral (right more than left) posterior quadrant polyspikes lasting 2 to 10 seconds alternating with generalized low amplitude 13 to 15 Hz beta activity.  Hyperventilation and photic stimulation were not performed.  Abnormality -Nonconvulsive status epilepticus, bilateral (right more than left) posterior quadrant -Excessive beta, generalized  IMPRESSION: This study showed evidence of nonconvulsive status epilepticus arising from bilateral (right more than left) posterior quadrant as well as profound diffuse encephalopathy, nonspecific etiology.   Dr. Lorraine Lax was immediately notified.

## 2019-08-31 NOTE — Progress Notes (Signed)
STAT EEG complete - results pending. ? ?

## 2019-08-31 NOTE — Consult Note (Signed)
Renal Service Consult Note [facility]  [name] 09/23/2019 Sol [place] Requesting Physician: Dr Vaughan Browner  Reason for Consult:  ESRD pt w/ AMS HPI: The patient is a 79 y.o. year-old with hx of DM2, atrial fib, HL, gout, HTN, combined CHF, CAD sp CABG June 2020 and ESRD on HD presented unresponsive to this am, sent from SNF.  Symptoms started this am. Per H&P in ED pt was jerking LUE possibly seizure-like activity.  BP's were high at 161/123 initially.  CT head showed hypoattenuation within the bilateral occipital white matter, possibly a manifestation of PRES. Cleveprex started and BP's are now 135/ 80.  Pt intubated for airway protection.  Asked to see for HD.   Last HD presumably was Friday. Pt on MWF schedule.  RUA access.  Pt not providing any hx.  Gets HD at [facility].    Recent admit march 2021 for AMS felt to be d/t opioids given for thoracic comp fx pain. Gabapentin also avoided d/t somnolence. T6-7 comp fx, underwent kyphoplasty on 08/12/19.  PAF, ESRD, HTN. Did not get home metop/ norvasc due to soft BP's. DC'd to SNF.  Stage 2 buttock ulcer.   Recent admit Dec 2020 for AMS, acute resp failure d/t COVID-19 infection, hyperkalemia. Improved over 4-5 day stay in hospital. DC"d to [facility] dialysis unit for COVID pt's.    ROS - n/a   Past Medical History  Past Medical History:  Diagnosis Date  . Anemia   . Arthritis   . Blood transfusion without reported diagnosis   . CAD (coronary artery disease)    a. 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  . Cataract   . Chronic anemia   . Chronic combined systolic and diastolic CHF (congestive heart failure) ([place])    a. 2D echo 08/2016 at [facility]: EF 50-55% with inferior wall HK, impaired LV filling, fair study.  . ESRD on hemodialysis ([place])   . Essential hypertension   . Gastritis   . GERD (gastroesophageal reflux disease)   . Gout   . Headache   . Hyperlipidemia   .  Hypothyroidism   . Iron deficiency anemia 11/08/2016  . NSTEMI (non-ST elevated myocardial infarction) ([place])    a. Complex admission 08/2016 - with severe hyperglycemia, AKI on CKD, severe anemia down to Hgb 6.8, acute combined CHF, troponin of 8.5, cath deferred due to renal dysfunction.  . Paroxysmal atrial fibrillation ([place])   . Type 2 diabetes mellitus ([place])   . Wears dentures    Past Surgical History  Past Surgical History:  Procedure Laterality Date  . BASCILIC VEIN TRANSPOSITION Right 06/12/2018   Procedure: FIRST STAGE BASCILIC VEIN TRANSPOSITION RIGHT ARM;  Surgeon: Rosetta Posner, MD;  Location: [place];  Service: Vascular;  Laterality: Right;  . BASCILIC VEIN TRANSPOSITION Right 08/07/2018   Procedure: BASCILIC VEIN TRANSPOSITION SECOND STAGE RIGHT ARM;  Surgeon: Rosetta Posner, MD;  Location: [place];  Service: Vascular;  Laterality: Right;  . CATARACT EXTRACTION     left  . COLONOSCOPY WITH PROPOFOL N/A 10/08/2016   5 mm transverse colon polyp note resected due to plavix. hemorrhoids  . CORONARY ARTERY BYPASS GRAFT N/A 11/20/2018   Procedure: CORONARY ARTERY BYPASS GRAFTING (CABG), ON PUMP, TIMES four, USING LEFT INTERNAL MAMMARY ARTERY AND ENDOSCOPICALLY HARVESTED RIGHT GREATER SAPHENOUS VEIN;  Surgeon: Melrose Nakayama, MD;  Location: [place];  Service: Open Heart Surgery;  Laterality: N/A;  . ENTEROSCOPY N/A 08/08/2017  Procedure: ENTEROSCOPY;  Surgeon: Daneil Dolin, MD;  [name];  Service: Endoscopy;  Laterality: N/A;  . ESOPHAGOGASTRODUODENOSCOPY N/A 10/06/2016   mild chroni gastritis, negative H.pylori  . ESOPHAGOGASTRODUODENOSCOPY (EGD) WITH PROPOFOL N/A 03/12/2017   mild edema/erythema of stomach, small bowel biopsy with focal villous tip lymphocytosis, ?partially developed celiac  . ESOPHAGOGASTRODUODENOSCOPY (EGD) WITH PROPOFOL N/A 08/08/2017   normal esophagus, small hiatal hernia, normal duodenal bulb, abnormal small bowel junction of duodenum and jejunum  lwith active bleeding 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, s/p clips and lesion tattooed  . GIVENS CAPSULE STUDY  10/08/2016   normal  . GIVENS CAPSULE STUDY N/A 10/29/2016   occasional gastric erosion, unremarkable small bowel  . IR KYPHO EA ADDL LEVEL THORACIC OR LUMBAR  08/12/2019  . IR KYPHO THORACIC WITH BONE BIOPSY  08/12/2019  . LEFT HEART CATH AND CORONARY ANGIOGRAPHY N/A 11/12/2018   Procedure: LEFT HEART CATH AND CORONARY ANGIOGRAPHY;  Surgeon: Burnell Blanks, MD;  Location: [place] [facility];  Service: Cardiovascular;  Laterality: N/A;  . ORIF HUMERUS FRACTURE Left 02/07/2015   Procedure: OPEN REDUCTION INTERNAL FIXATION (ORIF) PROXIMAL HUMERUS FRACTURE;  Surgeon: Marybelle Killings, MD;  Location: [place];  Service: Orthopedics;  Laterality: Left;  . TEE WITHOUT CARDIOVERSION N/A 11/20/2018   Procedure: TRANSESOPHAGEAL ECHOCARDIOGRAM (TEE);  Surgeon: Melrose Nakayama, MD;  Location: [place];  Service: Open Heart Surgery;  Laterality: N/A;  . teeth extractions    . THYROID SURGERY    . TOTAL HIP ARTHROPLASTY Left 02/07/2015   Procedure: TOTAL HIP ARTHROPLASTY ANTERIOR APPROACH ;  Surgeon: Marybelle Killings, MD;  Location: [place];  Service: Orthopedics;  Laterality: Left;  . WRIST SURGERY Left    Family History  Family History  Problem Relation Age of Onset  . Diabetes Mother   . Asthma Mother   . Early death Mother 54       Pneumonia  . Early death Father        Killed at rodeo  . CAD Neg Hx   . GI Bleed Neg Hx    Social History  reports that she has never smoked. She has never used smokeless tobacco. She reports that she does not drink alcohol or use drugs. Allergies  Allergies  Allergen Reactions  . Insulin Glargine Swelling    "Makes me swell like a balloon all over", including face, but without any respiratory distress or rashes. Associated with weight gain.  . Statins Other (See Comments)    "I've tried them all; my muscle aches were so bad I couldn't walk".  . Other     Home medications Prior to Admission medications   Medication Sig Start Date End Date Taking? Authorizing Provider  acetaminophen (TYLENOL) 325 MG tablet Take 2 tablets (650 mg total) by mouth every 6 (six) hours. 08/14/19   Johnson, Clanford L, MD  amiodarone (PACERONE) 200 MG tablet Take 1 tablet (200 mg total) by mouth daily. 01/28/19   Arnoldo Lenis, MD  aspirin EC 81 MG EC tablet Take 1 tablet (81 mg total) by mouth daily. 11/28/18   Nani Skillern, PA-C  calcium acetate (PHOSLO) 667 MG capsule Take 667-2,001 mg by mouth in the morning, at noon, in the evening, and at bedtime. Take 3 capsules with each meal and 1 capsule with snacks daily    [provider]  Cholecalciferol (VITAMIN D-3) 125 MCG (5000 UT) TABS Take 5,000 mg by mouth daily.     [provider]  Evolocumab (REPATHA SURECLICK) 540 MG/ML SOAJ Inject 140 mg into the skin every 14 (fourteen) days. 01/06/19   Arnoldo Lenis, MD  febuxostat (ULORIC) 40 MG tablet Take 1 tablet by mouth once daily 06/16/19   Doree Albee, MD  Multiple Vitamin (MULTIVITAMIN WITH MINERALS) TABS tablet Take 1 tablet by mouth daily.    [provider]  NP THYROID 120 MG tablet Take 1 tablet by mouth once daily 07/22/19 08/21/19  Hurshel Party C, MD  omeprazole (PRILOSEC) 20 MG capsule Take 20 mg by mouth daily.    [provider]  ondansetron (ZOFRAN) 4 MG tablet TAKE 1 TABLET BY MOUTH EVERY 8 HOURS AS NEEDED FOR NAUSEA AND VOMITING Patient taking differently: Take 4 mg by mouth every 8 (eight) hours as needed for nausea or vomiting.  07/14/19   Mahala Menghini, PA-C  polyethylene glycol (MIRALAX / GLYCOLAX) 17 g packet Take 17 g by mouth daily as needed for mild constipation. 08/14/19   Johnson, Clanford L, MD  senna-docusate (SENOKOT-S) 8.6-50 MG tablet Take 1 tablet by mouth at bedtime. 08/14/19   Murlean Iba, MD  warfarin (COUMADIN) 4 MG tablet Take 1 tablet (4 mg total) by mouth daily at 6 PM.  08/25/19   Gosrani, Nimish C, MD  gabapentin (NEURONTIN) 300 MG capsule Take 2 capsules (600 mg total) by mouth at bedtime. 11/18/17   Mahala Menghini, PA-C  gabapentin (NEURONTIN) 300 MG capsule TAKE 2 CAPSULES BY MOUTH ONCE DAILY AT NIGHT 01/30/19   Doree Albee, MD  NP THYROID 120 MG tablet Take 120 mg by mouth daily. 08/28/18   [provider]  NP THYROID 120 MG tablet Take 1 tablet by mouth once daily 06/16/19   Hurshel Party C, MD     Vitals:   08/30/2019 1130 08/28/2019 1145 09/20/2019 1148 09/01/2019 1200  BP: 129/64 131/65 131/65 130/68  Pulse: 62 62  61  Resp: 14 14  14   Temp:    (!) 97.5 F (36.4 C)  TempSrc:    Axillary  SpO2: 100% 100% 100% 100%  Weight:      Height:       Exam Gen on vent, not responsive, getting EEG leads placed No rash, cyanosis or gangrene Sclera anicteric, throat w/ ETT  No jvd or bruits Chest clear bilat to bases no rales, wheezing or bronchial BS RRR no MRG Abd soft ntnd no mass or ascites +bs GU defer MS no joint effusions or deformity Ext 1+ hip and pretib edema, no wounds or ulcers Neuro is not responsive, sedated on vent RUA AVF+ bruit   Home meds:  - aspirin 81/ warfarin 4mg  pm  - amiodarone 200 qd  - phoslo 3 ac tid  - prilosec 20/ febuxostat 40 qd  - evolocumab 140 mg sq q2wks  - prn's/ vitamins/ supplements     Na 134  K 4.2  Bun 34   Creat 5.1   Ca 10.8/ 12.4   Hb 11    CXR - no active disease   Outpt HD: [name] MWF    4h  300/600   71.5kg   2/2.25 bath  Hep none  RUE AVF      Assessment/ Plan: 1. AMS - possible PRES by CT scan, high BP's on presentation. Is now sp Cleveprex w/ improved BP's. Some vol overload/ periph edema on exam. CXR no edema.  2. ESRD - usual HD MWF.  Labs stable, will plan HD off sched tomorrow due  to high pt census today.   3. HTN/ vol - not on BP meds at home. Could be vol- related if pt losing body wt. Will lower vol w/ HD as tolerated. Ask pharm if any meds could be implicated.  4. Gout   5. Anemia ckd - Hb > 11, no need esa 6. MBD ckd - cont binder, get records 7. VDRF - per primary team, for airway protection. CXR neg.       Kelly Splinter  MD 09/14/2019, 1:07 PM  Recent Labs  Lab 08/25/19 1334 09/23/2019 0147  WBC 7.1 8.2  HGB 9.4* 11.1*   Recent Labs  Lab 08/25/19 1334 09/25/2019 0147  K 4.0 4.2  BUN 27* 34*  CREATININE 4.16* 5.16*  CALCIUM 10.8* 12.4*

## 2019-08-31 NOTE — H&P (Signed)
NAME:  [name], MRN:  [id number], DOB:  Nov 12, 1940, LOS: 0 ADMISSION DATE:  09/15/2019, CONSULTATION DATE: April 5 REFERRING MD: Dr. Tomi Bamberger EDP, CHIEF COMPLAINT: Altered mental status/PRES  Brief History   79 year old female from nursing home with altered mental status of 4/5.  Profoundly hypertensive in ED with CT concerning for PRES.  Intubated due to unresponsiveness transferred to [facility] for admission.  History of present illness   Patient is encephalopathic and/or intubated. Therefore history has been obtained from chart review. 79 year old female with past medical history as below, which is significant for CAD status post CABG, chronic combined heart failure, ESRD on HD, hypertension, hypothyroidism, diabetes mellitus, and paroxysmal atrial fibrillation.  She takes warfarin for anticoagulation.  She resides in a nursing facility where she was noted to have altered mental status with periods of unresponsiveness in the early morning hours of 4/5.  EMS was called and she was transported to [facility] emergency department where she was found to be significantly hypertensive and to have rhythmic jerking of her left upper extremity with some purposeful movement of the right upper extremity.  She was given Ativan IV for possible seizure of the left upper extremity and shortly after was noted to be unresponsive, for which she was intubated.  CT scan of the head was suspicious for PRES. she was started on nicardipine infusion for hypertension.  She was accepted for admission at [facility] under the pulmonary critical care medicine service.  She was recently admitted 3/8-3/19 for AMS presumably secondary to home opioid medications. Also found to have thoracic spine compression fracture t6-t7. Underwent kyphoplasty 3/17.   Past Medical History   has a past medical history of Anemia, Arthritis, Blood transfusion without reported diagnosis, CAD (coronary artery disease), Cataract, Chronic anemia,  Chronic combined systolic and diastolic CHF (congestive heart failure) ([place]), ESRD on hemodialysis ([place]), Essential hypertension, Gastritis, GERD (gastroesophageal reflux disease), Gout, Headache, Hyperlipidemia, Hypothyroidism, Iron deficiency anemia (11/08/2016), NSTEMI (non-ST elevated myocardial infarction) ([place]), Paroxysmal atrial fibrillation ([place]), Type 2 diabetes mellitus ([place]), and Wears dentures.   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  MRI recommended.  Micro Data:    Antimicrobials:     Interim history/subjective:    Objective   Blood pressure 126/63, pulse 65, temperature 97.9 F (36.6 C), temperature source Oral, resp. rate 14, height 5\' 5"  (1.651 m), weight 72.6 kg, SpO2 100 %.    Vent Mode: PRVC FiO2 (%):  [40 %] 40 % Set Rate:  [14 bmp] 14 bmp Vt Set:  [450 mL] 450 mL PEEP:  [5 cmH20] 5 cmH20 Plateau Pressure:  [14 cmH20-16 cmH20] 14 cmH20   Intake/Output Summary (Last 24 hours) at 09/10/2019 1048 Last data filed at 09/10/2019 0350 Gross per 24 hour  Intake 100 ml  Output --  Net 100 ml   Filed Weights   08/30/2019 0141  Weight: 72.6 kg    Examination: General: elderly female in NAD HENT: [place]/AT, PERRL, pinpoint, no JVD Lungs: Clear bilateral breath sounds.  Cardiovascular: RRR, no MRG Abdomen: Soft, non-distended Extremities: No acute deformity Neuro: Sedated. PASS -3  Resolved Hospital Problem list     Assessment & Plan:   Acute encephalopathy: concern for hypertensive encephalopathy/PRES considering hypertension on presentation and CT head findings. Questionable seizure  activity in ED.  - Admit to 4N neuro ICU - Consult neurology - EEG - MRI - BP control as below. - Transition off versed infusion  in favor of propofol due to advanced age. RASS goal -1 to -2.   PAF: warfarin, subtherapeutic - Continue warfarin per tube with pharmacy dosing.  - Telemetry monitoring - Continue home amiodarone  ESRD on HD: MWF schedule - Nephrology consulted (Dr. Jonnie Finner) - Follow BMP  DM: - CBG monitoring and SSI  Hypertensive crisis: - continue nicardipine infusion as needed (currently off) - Has been > 6 hours since presentation so OK to target SBP < 15mm Hg.  - Will hold off restarting home BP meds for now because sedatives are keeping SBP 120s.   CAD s/p CABG HFpEF - Telemetry  Hypothyroid - Continue home NP thyroid 120mg  via tube.    Best practice:  Diet: NPO Pain/Anxiety/Delirium protocol (if indicated): Propofol infusion VAP protocol (if indicated): per protocol DVT prophylaxis: warfarin GI prophylaxis: NA Glucose control: SSI Mobility: BR Code Status: FULL Family Communication: Son updated Disposition: ICU  Labs   CBC: Recent Labs  Lab 08/25/19 1334 09/23/2019 0147  WBC 7.1 8.2  HGB 9.4* 11.1*  HCT 29.3* 36.7  MCV 93.6 101.9*  PLT 336 176    Basic Metabolic Panel: Recent Labs  Lab 08/25/19 1334 09/02/2019 0147  NA 133* 134*  K 4.0 4.2  CL 94* 95*  CO2 28 28  GLUCOSE 127* 113*  BUN 27* 34*  CREATININE 4.16* 5.16*  CALCIUM 10.8* 12.4*   GFR: Estimated Creatinine Clearance: 8.8 mL/min (A) (by C-G formula based on SCr of 5.16 mg/dL (H)). Recent Labs  Lab 08/25/19 1334 09/12/2019 0147  WBC 7.1 8.2    Liver Function Tests: Recent Labs  Lab 08/25/19 1334  AST 17  ALT 6  BILITOT 0.3  PROT 5.6*   No results for input(s): LIPASE, AMYLASE in the last 168 hours. No results for input(s): AMMONIA in the last 168 hours.  ABG    Component Value Date/Time   PHART 7.505 (H) 09/14/2019 0546   PCO2ART 35.3 09/08/2019 0546   PO2ART 127 (H) 08/29/2019 0546   HCO3 28.7 (H) 09/08/2019 0546   TCO2 28 04/13/2019 1752   ACIDBASEDEF 4.6 (H) 08/03/2019 1120    O2SAT 99.1 09/23/2019 0546     Coagulation Profile: Recent Labs  Lab 08/25/19 1334 09/23/2019 0147  INR 5.9* 1.5*    Cardiac Enzymes: No results for input(s): CKTOTAL, CKMB, CKMBINDEX, TROPONINI in the last 168 hours.  HbA1C: Hgb A1c MFr Bld  Date/Time Value Ref Range Status  08/03/2019 11:20 AM 4.9 4.8 - 5.6 % Final    Comment:    (NOTE) Pre diabetes:          5.7%-6.4% Diabetes:              >6.4% Glycemic control for   <7.0% adults with diabetes   05/19/2019 09:30 AM 6.1 (H) 4.8 - 5.6 % Final    Comment:    (NOTE) Pre diabetes:          5.7%-6.4% Diabetes:              >6.4% Glycemic control for   <7.0% adults with diabetes     CBG: No results for input(s): GLUCAP in the last 168 hours.  Review of Systems:   Unable as patient is encephalopathic and intubated.   Past Medical History  She,  has a past medical history of Anemia, Arthritis, Blood  transfusion without reported diagnosis, CAD (coronary artery disease), Cataract, Chronic anemia, Chronic combined systolic and diastolic CHF (congestive heart failure) ([place]), ESRD on hemodialysis ([place]), Essential hypertension, Gastritis, GERD (gastroesophageal reflux disease), Gout, Headache, Hyperlipidemia, Hypothyroidism, Iron deficiency anemia (11/08/2016), NSTEMI (non-ST elevated myocardial infarction) ([place]), Paroxysmal atrial fibrillation ([place]), Type 2 diabetes mellitus ([place]), and Wears dentures.   Surgical History    Past Surgical History:  Procedure Laterality Date  . BASCILIC VEIN TRANSPOSITION Right 06/12/2018   Procedure: FIRST STAGE BASCILIC VEIN TRANSPOSITION RIGHT ARM;  Surgeon: Rosetta Posner, MD;  Location: [place];  Service: Vascular;  Laterality: Right;  . BASCILIC VEIN TRANSPOSITION Right 08/07/2018   Procedure: BASCILIC VEIN TRANSPOSITION SECOND STAGE RIGHT ARM;  Surgeon: Rosetta Posner, MD;  Location: [place];  Service: Vascular;  Laterality: Right;  . CATARACT EXTRACTION     left  . COLONOSCOPY WITH PROPOFOL N/A  10/08/2016   5 mm transverse colon polyp note resected due to plavix. hemorrhoids  . CORONARY ARTERY BYPASS GRAFT N/A 11/20/2018   Procedure: CORONARY ARTERY BYPASS GRAFTING (CABG), ON PUMP, TIMES four, USING LEFT INTERNAL MAMMARY ARTERY AND ENDOSCOPICALLY HARVESTED RIGHT GREATER SAPHENOUS VEIN;  Surgeon: Melrose Nakayama, MD;  Location: [place];  Service: Open Heart Surgery;  Laterality: N/A;  . ENTEROSCOPY N/A 08/08/2017   Procedure: ENTEROSCOPY;  Surgeon: Daneil Dolin, MD;  [name];  Service: Endoscopy;  Laterality: N/A;  . ESOPHAGOGASTRODUODENOSCOPY N/A 10/06/2016   mild chroni gastritis, negative H.pylori  . ESOPHAGOGASTRODUODENOSCOPY (EGD) WITH PROPOFOL N/A 03/12/2017   mild edema/erythema of stomach, small bowel biopsy with focal villous tip lymphocytosis, ?partially developed celiac  . 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 GIVENS CAPSULE STUDY  10/08/2016   normal  . GIVENS CAPSULE STUDY N/A 10/29/2016   occasional gastric erosion, unremarkable small bowel  . IR KYPHO EA ADDL LEVEL THORACIC OR LUMBAR  08/12/2019  . IR KYPHO THORACIC WITH BONE BIOPSY  08/12/2019  . LEFT HEART CATH AND CORONARY ANGIOGRAPHY N/A 11/12/2018   Procedure: LEFT HEART CATH AND CORONARY ANGIOGRAPHY;  Surgeon: Burnell Blanks, MD;  Location: [place] [facility];  Service: Cardiovascular;  Laterality: N/A;  . ORIF HUMERUS FRACTURE Left 02/07/2015   Procedure: OPEN REDUCTION INTERNAL FIXATION (ORIF) PROXIMAL HUMERUS FRACTURE;  Surgeon: Marybelle Killings, MD;  Location: [place];  Service: Orthopedics;  Laterality: Left;  . TEE WITHOUT CARDIOVERSION N/A 11/20/2018   Procedure: TRANSESOPHAGEAL ECHOCARDIOGRAM (TEE);  Surgeon: Melrose Nakayama, MD;  Location: [place];  Service:  Open Heart Surgery;  Laterality: N/A;  . teeth extractions    . THYROID SURGERY    . TOTAL HIP ARTHROPLASTY Left 02/07/2015   Procedure: TOTAL HIP ARTHROPLASTY ANTERIOR APPROACH ;  Surgeon: Marybelle Killings, MD;  Location: [place];  Service: Orthopedics;  Laterality: Left;  . WRIST SURGERY Left      Social History   reports that she has never smoked. She has never used smokeless tobacco. She reports that she does not drink alcohol or use drugs.   Family History   Her family history includes Asthma in her mother; Diabetes in her mother; Early death in her father; Early death (age of onset: 59) in her mother. There is no history of CAD or GI Bleed.   Allergies Allergies  Allergen Reactions  .  Insulin Glargine Swelling    "Makes me swell like a balloon all over", including face, but without any respiratory distress or rashes. Associated with weight gain.  . Statins Other (See Comments)    "I've tried them all; my muscle aches were so bad I couldn't walk".  . Other      Home Medications  Prior to Admission medications   Medication Sig Start Date End Date Taking? Authorizing Provider  acetaminophen (TYLENOL) 325 MG tablet Take 2 tablets (650 mg total) by mouth every 6 (six) hours. 08/14/19   Johnson, Clanford L, MD  amiodarone (PACERONE) 200 MG tablet Take 1 tablet (200 mg total) by mouth daily. 01/28/19   Arnoldo Lenis, MD  aspirin EC 81 MG EC tablet Take 1 tablet (81 mg total) by mouth daily. 11/28/18   Nani Skillern, PA-C  calcium acetate (PHOSLO) 667 MG capsule Take 667-2,001 mg by mouth in the morning, at noon, in the evening, and at bedtime. Take 3 capsules with each meal and 1 capsule with snacks daily    [provider]  Cholecalciferol (VITAMIN D-3) 125 MCG (5000 UT) TABS Take 5,000 mg by mouth daily.     [provider]  Evolocumab (REPATHA SURECLICK) 754 MG/ML SOAJ Inject 140 mg into the skin every 14 (fourteen) days. 01/06/19   Arnoldo Lenis, MD    febuxostat (ULORIC) 40 MG tablet Take 1 tablet by mouth once daily 06/16/19   Doree Albee, MD  Multiple Vitamin (MULTIVITAMIN WITH MINERALS) TABS tablet Take 1 tablet by mouth daily.    [provider]  NP THYROID 120 MG tablet Take 1 tablet by mouth once daily 07/22/19 08/21/19  Hurshel Party C, MD  omeprazole (PRILOSEC) 20 MG capsule Take 20 mg by mouth daily.    [provider]  ondansetron (ZOFRAN) 4 MG tablet TAKE 1 TABLET BY MOUTH EVERY 8 HOURS AS NEEDED FOR NAUSEA AND VOMITING Patient taking differently: Take 4 mg by mouth every 8 (eight) hours as needed for nausea or vomiting.  07/14/19   Mahala Menghini, PA-C  polyethylene glycol (MIRALAX / GLYCOLAX) 17 g packet Take 17 g by mouth daily as needed for mild constipation. 08/14/19   Johnson, Clanford L, MD  senna-docusate (SENOKOT-S) 8.6-50 MG tablet Take 1 tablet by mouth at bedtime. 08/14/19   Murlean Iba, MD  warfarin (COUMADIN) 4 MG tablet Take 1 tablet (4 mg total) by mouth daily at 6 PM. 08/25/19   Gosrani, Nimish C, MD  gabapentin (NEURONTIN) 300 MG capsule Take 2 capsules (600 mg total) by mouth at bedtime. 11/18/17   Mahala Menghini, PA-C  gabapentin (NEURONTIN) 300 MG capsule TAKE 2 CAPSULES BY MOUTH ONCE DAILY AT NIGHT 01/30/19   Doree Albee, MD  NP THYROID 120 MG tablet Take 120 mg by mouth daily. 08/28/18   [provider]  NP THYROID 120 MG tablet Take 1 tablet by mouth once daily 06/16/19   Doree Albee, MD     Critical care time: 45 mins     Georgann Housekeeper, AGACNP-BC [place] for personal pager PCCM on call pager [phone]  09/09/2019 11:40 AM

## 2019-08-31 NOTE — ED Notes (Signed)
Called [facility] with bed assignment and for transport to [facility]. 

## 2019-08-31 NOTE — Progress Notes (Signed)
eLink Physician-Brief Progress Note Patient Name: [name] DOB: [birth year] MRN: [id number]   Date of Service  09/22/2019  HPI/Events of Note  Bradycardia - HR = 40. Likely d/t Propofol.   eICU Interventions  Will order: 1. Atropine 1 mg IV at bedside at all times.  2. Nursing to ask Neurology if Versed IV infusion could be increased and Propofol IV infusion decreased to help increase HR.      Intervention Category Major Interventions: Arrhythmia - evaluation and management  [name] 09/16/2019, 9:15 PM

## 2019-08-31 NOTE — ED Triage Notes (Signed)
Patient brought in by EMS from [facility] for altered mental states . Patient unable to give any information . [place] states patient has had periods of unresponsiveness.

## 2019-08-31 NOTE — Progress Notes (Signed)
LTM EEG reviewed. Continues to show epileptic tracing from both hemispheres posteriorly. Consistent with NCSE. HR issues - currently bradycardic in the 39 range. Increased versed but concern if increase prop-might make her more bradycardic. Spoke with Dr. Oletta Darter, CCM about the heart rate issues. At this time, it is reasonable to load with phenobarbital 20 mg/kg IV x1 and check the level in 1 hour. We will start maintenance dose after the levels obtained. We will continue to watch the LTM EEG If the heart rate stabilizes, we will up the Versed and hopefully propofol as well.  -- Amie [place], MD [facility] Pager: [phone] If 7pm to 7am, please call on call as listed on [facility]ION.

## 2019-08-31 NOTE — Progress Notes (Signed)
LTM EEG hooked up and running - no initial skin breakdown - push button tested - neuro notified.  Same leads used.  

## 2019-08-31 NOTE — ED Provider Notes (Signed)
[facility] EMERGENCY DEPARTMENT Provider Note   CSN: [id number] Arrival date & time: 08/29/2019  0139   Time seen 2:40 AM  History Chief Complaint  Patient presents with  . Altered Mental Status   Level 5 caveat for altered mental status  [name] is a 79 y.o. female.  HPI   When I enter the room patient is noted to have rhythmic jerking of her left upper extremity.  There is no involvement of her legs or her right upper extremity.  Her right extremity is having some semipurposeful movement touching her right face.  Patient does not respond to any verbal stimuli.  She does not open her eyes to tactile stimulus.  Evidently she was brought from her nursing home for altered mental status.  PCP Ailene Ards, NP   Past Medical History:  Diagnosis Date  . Anemia   . Arthritis   . Blood transfusion without reported diagnosis   . CAD (coronary artery disease)    a. 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  . Cataract   . Chronic anemia   . Chronic combined systolic and diastolic CHF (congestive heart failure) ([place])    a. 2D echo 08/2016 at [facility]: EF 50-55% with inferior wall HK, impaired LV filling, fair study.  . ESRD on hemodialysis ([place])   . Essential hypertension   . Gastritis   . GERD (gastroesophageal reflux disease)   . Gout   . Headache   . Hyperlipidemia   . Hypothyroidism   . Iron deficiency anemia 11/08/2016  . NSTEMI (non-ST elevated myocardial infarction) ([place])    a. Complex admission 08/2016 - with severe hyperglycemia, AKI on CKD, severe anemia down to Hgb 6.8, acute combined CHF, troponin of 8.5, cath deferred due to renal dysfunction.  . Paroxysmal atrial fibrillation ([place])   . Type 2 diabetes mellitus ([place])   . Wears dentures     Patient Active Problem List   Diagnosis Date Noted  . Seizure ([place]) 09/25/2019  . Chronic renal failure   . Compression fx, thoracic spine ([place])   . Pressure injury of buttock, stage 2 ([place])  08/04/2019  . Episode of unresponsiveness 08/03/2019  . Bradycardia 08/03/2019  . Cellulitis 07/02/2019  . Diabetic ulcer of right foot ([place]) 06/18/2019  . Acute hypoxemic respiratory failure due to COVID-19 ([place]) 05/19/2019  . Atrial fibrillation ([place]) 12/03/2018  . Encounter for therapeutic drug monitoring 12/03/2018  . Pressure injury of skin 11/24/2018  . CAD (coronary artery disease) 11/21/2018  . Chronic diastolic HF (heart failure) ([place]) 11/21/2018  . Respiratory failure with hypoxia ([place]) 11/21/2018  . End-stage renal disease on hemodialysis ([place]) 11/21/2018  . Diabetes mellitus type 2, controlled, with complications ([place]) 47/82/9562  . 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 ESRD (end stage renal disease) on dialysis ([place]) 11/12/2018  . Non-ST elevation (NSTEMI) myocardial infarction ([place]) 11/06/2018  . Polyp of transverse colon 10/08/2017  . Anemia 08/07/2017  . GI bleed 07/30/2017  . Hyperkalemia 07/30/2017  . Essential hypertension, benign 12/13/2016  . Hypothyroidism 12/13/2016  . Iron deficiency anemia 11/08/2016  . Normocytic anemia 10/26/2016  . Elevated troponin   . Aortic atherosclerosis ([place]) 10/25/2016  . Symptomatic anemia 10/04/2016  . History of non-ST elevation myocardial infarction (NSTEMI) 10/04/2016  . Mixed hyperlipidemia 10/04/2016  . Neuropathy 09/20/2016  . Chronic kidney disease, stage IV (severe) ([place]) 08/03/2015  . Fall 02/05/2015  . Hip fracture ([place]) 02/05/2015  . HTN (hypertension) 02/05/2015  .  Gout 02/05/2015  . Left humeral fracture 02/05/2015  . Hyperlipidemia associated with type 2 diabetes mellitus ([place]) 05/01/2013    Past Surgical History:  Procedure Laterality Date  . BASCILIC VEIN TRANSPOSITION Right 06/12/2018   Procedure: FIRST STAGE BASCILIC VEIN TRANSPOSITION RIGHT ARM;  Surgeon: Rosetta Posner, MD;  Location: [place];  Service: Vascular;  Laterality: Right;  . BASCILIC VEIN TRANSPOSITION Right 08/07/2018   Procedure: BASCILIC VEIN  TRANSPOSITION SECOND STAGE RIGHT ARM;  Surgeon: Rosetta Posner, MD;  Location: [place];  Service: Vascular;  Laterality: Right;  . CATARACT EXTRACTION     left  . COLONOSCOPY WITH PROPOFOL N/A 10/08/2016   5 mm transverse colon polyp note resected due to plavix. hemorrhoids  . CORONARY ARTERY BYPASS GRAFT N/A 11/20/2018   Procedure: CORONARY ARTERY BYPASS GRAFTING (CABG), ON PUMP, TIMES four, USING LEFT INTERNAL MAMMARY ARTERY AND ENDOSCOPICALLY HARVESTED RIGHT GREATER SAPHENOUS VEIN;  Surgeon: Melrose Nakayama, MD;  Location: [place];  Service: Open Heart Surgery;  Laterality: N/A;  . ENTEROSCOPY N/A 08/08/2017   Procedure: ENTEROSCOPY;  Surgeon: Daneil Dolin, MD;  [name];  Service: Endoscopy;  Laterality: N/A;  . ESOPHAGOGASTRODUODENOSCOPY N/A 10/06/2016   mild chroni gastritis, negative H.pylori  . ESOPHAGOGASTRODUODENOSCOPY (EGD) WITH PROPOFOL N/A 03/12/2017   mild edema/erythema of stomach, small bowel biopsy with focal villous tip lymphocytosis, ?partially developed celiac  . 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 GIVENS CAPSULE STUDY  10/08/2016   normal  . GIVENS CAPSULE STUDY N/A 10/29/2016   occasional gastric erosion, unremarkable small bowel  . IR KYPHO EA ADDL LEVEL THORACIC OR LUMBAR  08/12/2019  . IR KYPHO THORACIC WITH BONE BIOPSY  08/12/2019  . LEFT HEART CATH AND CORONARY ANGIOGRAPHY N/A 11/12/2018   Procedure: LEFT HEART CATH AND CORONARY ANGIOGRAPHY;  Surgeon: Burnell Blanks, MD;  Location: [place] [facility];  Service: Cardiovascular;  Laterality: N/A;  . ORIF HUMERUS FRACTURE Left 02/07/2015   Procedure: OPEN REDUCTION INTERNAL FIXATION (ORIF) PROXIMAL HUMERUS FRACTURE;  Surgeon: Marybelle Killings, MD;  Location: [place];  Service:  Orthopedics;  Laterality: Left;  . TEE WITHOUT CARDIOVERSION N/A 11/20/2018   Procedure: TRANSESOPHAGEAL ECHOCARDIOGRAM (TEE);  Surgeon: Melrose Nakayama, MD;  Location: [place];  Service: Open Heart Surgery;  Laterality: N/A;  . teeth extractions    . THYROID SURGERY    . TOTAL HIP ARTHROPLASTY Left 02/07/2015   Procedure: TOTAL HIP ARTHROPLASTY ANTERIOR APPROACH ;  Surgeon: Marybelle Killings, MD;  Location: [place];  Service: Orthopedics;  Laterality: Left;  . WRIST SURGERY Left      OB History   No obstetric history on file.     Family History  Problem Relation Age of Onset  . Diabetes Mother   . Asthma Mother   . Early death Mother 63       Pneumonia  . Early death Father        Killed at rodeo  . CAD Neg Hx   . GI Bleed Neg Hx     Social History   Tobacco Use  . Smoking status: Never Smoker  . Smokeless tobacco: Never Used  Substance Use Topics  . Alcohol use: No    Alcohol/week: 0.0 standard drinks  . Drug use: No  Lives in a facility  Home Medications Prior to Admission medications   Medication Sig Start Date End Date Taking? Authorizing Provider  acetaminophen (TYLENOL) 325 MG tablet Take 2 tablets (650 mg total) by mouth every 6 (six) hours. 08/14/19   Johnson, Clanford L, MD  amiodarone (PACERONE) 200 MG tablet Take 1 tablet (200 mg total) by mouth daily. 01/28/19   Arnoldo Lenis, MD  aspirin EC 81 MG EC tablet Take 1 tablet (81 mg total) by mouth daily. 11/28/18   Nani Skillern, PA-C  calcium acetate (PHOSLO) 667 MG capsule Take 667-2,001 mg by mouth in the morning, at noon, in the evening, and at bedtime. Take 3 capsules with each meal and 1 capsule with snacks daily    [provider]  Cholecalciferol (VITAMIN D-3) 125 MCG (5000 UT) TABS Take 5,000 mg by mouth daily.     [provider]  Evolocumab (REPATHA SURECLICK) 101 MG/ML SOAJ Inject 140 mg into the skin every 14 (fourteen) days. 01/06/19   Arnoldo Lenis, MD  febuxostat  (ULORIC) 40 MG tablet Take 1 tablet by mouth once daily 06/16/19   Doree Albee, MD  Multiple Vitamin (MULTIVITAMIN WITH MINERALS) TABS tablet Take 1 tablet by mouth daily.    [provider]  NP THYROID 120 MG tablet Take 1 tablet by mouth once daily 07/22/19 08/21/19  Hurshel Party C, MD  omeprazole (PRILOSEC) 20 MG capsule Take 20 mg by mouth daily.    [provider]  ondansetron (ZOFRAN) 4 MG tablet TAKE 1 TABLET BY MOUTH EVERY 8 HOURS AS NEEDED FOR NAUSEA AND VOMITING Patient taking differently: Take 4 mg by mouth every 8 (eight) hours as needed for nausea or vomiting.  07/14/19   Mahala Menghini, PA-C  polyethylene glycol (MIRALAX / GLYCOLAX) 17 g packet Take 17 g by mouth daily as needed for mild constipation. 08/14/19   Johnson, Clanford L, MD  senna-docusate (SENOKOT-S) 8.6-50 MG tablet Take 1 tablet by mouth at bedtime. 08/14/19   Murlean Iba, MD  warfarin (COUMADIN) 4 MG tablet Take 1 tablet (4 mg total) by mouth daily at 6 PM. 08/25/19   Gosrani, Nimish C, MD  gabapentin (NEURONTIN) 300 MG capsule Take 2 capsules (600 mg total) by mouth at bedtime. 11/18/17   Mahala Menghini, PA-C  gabapentin (NEURONTIN) 300 MG capsule TAKE 2 CAPSULES BY MOUTH ONCE DAILY AT NIGHT 01/30/19   Doree Albee, MD  NP THYROID 120 MG tablet Take 120 mg by mouth daily. 08/28/18   [provider]  NP THYROID 120 MG tablet Take 1 tablet by mouth once daily 06/16/19   Doree Albee, MD    Allergies    Insulin glargine, Statins, and Other  Review of Systems   Review of Systems  Unable to perform ROS: Mental status change    Physical Exam Updated Vital Signs BP (!) 111/53   Pulse 74   Temp 97.9 F (36.6 C) (Oral)   Resp 18   Ht 5\' 5"  (1.651 m)   Wt 72.6 kg   SpO2 100%   BMI 26.63 kg/m   Physical Exam Vitals and nursing note reviewed.  Constitutional:      Appearance: She is obese.     Comments: Elderly female who has her eyes closed.  HENT:     Head:  Normocephalic and atraumatic.     Nose: Nose normal.  Eyes:     Extraocular Movements: Extraocular movements intact.     Conjunctiva/sclera: Conjunctivae normal.  Pupils: Pupils are equal, round, and reactive to light.     Comments: Eyes are deviated downward into the left sometimes there is some rhythmic movement of her eyelids  Cardiovascular:     Rate and Rhythm: Normal rate and regular rhythm.  Pulmonary:     Effort: Pulmonary effort is normal. No respiratory distress.     Breath sounds: Normal breath sounds.  Musculoskeletal:        General: No swelling or deformity.     Cervical back: Rigidity present.  Skin:    General: Skin is dry.     Coloration: Skin is pale.  Neurological:     Comments: Patient is not alert, she appears to be having some focal seizure activity in her left upper extremity and may be some involvement of her eyelids.  Psychiatric:     Comments: Unable to assess     ED Results / Procedures / Treatments   Labs (all labs ordered are listed, but only abnormal results are displayed) Results for orders placed or performed during the hospital encounter of 09/11/2019  Respiratory Panel by RT PCR (Flu A&B, Covid) - Nasopharyngeal Swab   Specimen: Nasopharyngeal Swab  Result Value Ref Range   SARS Coronavirus 2 by RT PCR NEGATIVE NEGATIVE   Influenza A by PCR NEGATIVE NEGATIVE   Influenza B by PCR NEGATIVE NEGATIVE  CBC  Result Value Ref Range   WBC 8.2 4.0 - 10.5 K/uL   RBC 3.60 (L) 3.87 - 5.11 MIL/uL   Hemoglobin 11.1 (L) 12.0 - 15.0 g/dL   HCT 36.7 36.0 - 46.0 %   MCV 101.9 (H) 80.0 - 100.0 fL   MCH 30.8 26.0 - 34.0 pg   MCHC 30.2 30.0 - 36.0 g/dL   RDW 18.6 (H) 11.5 - 15.5 %   Platelets 386 150 - 400 K/uL   nRBC 0.0 0.0 - 0.2 %  Basic metabolic panel  Result Value Ref Range   Sodium 134 (L) 135 - 145 mmol/L   Potassium 4.2 3.5 - 5.1 mmol/L   Chloride 95 (L) 98 - 111 mmol/L   CO2 28 22 - 32 mmol/L   Glucose, Bld 113 (H) 70 - 99 mg/dL   BUN 34  (H) 8 - 23 mg/dL   Creatinine, Ser 5.16 (H) 0.44 - 1.00 mg/dL   Calcium 12.4 (H) 8.9 - 10.3 mg/dL   GFR calc non Af Amer 7 (L) >60 mL/min   GFR calc Af Amer 9 (L) >60 mL/min   Anion gap 11 5 - 15  Protime-INR  Result Value Ref Range   Prothrombin Time 18.0 (H) 11.4 - 15.2 seconds   INR 1.5 (H) 0.8 - 1.2  Blood gas, arterial  Result Value Ref Range   FIO2 40.00    pH, Arterial 7.505 (H) 7.350 - 7.450   pCO2 arterial 35.3 32.0 - 48.0 mmHg   pO2, Arterial 127 (H) 83.0 - 108.0 mmHg   Bicarbonate 28.7 (H) 20.0 - 28.0 mmol/L   Acid-Base Excess 4.4 (H) 0.0 - 2.0 mmol/L   O2 Saturation 99.1 %   Patient temperature 37.0    Allens test (pass/fail) PASS PASS  Troponin I (High Sensitivity)  Result Value Ref Range   Troponin I (High Sensitivity) 63 (H) <18 ng/L  Troponin I (High Sensitivity)  Result Value Ref Range   Troponin I (High Sensitivity) 62 (H) <18 ng/L   Laboratory interpretation all normal except chronic renal failure on dialysis with normal potassium, subtherapeutic INR on Coumadin, mild  anemia, ABG done while on ventilator shows a alkalosis that is probably mixed.  Her delta troponins are negative, although elevated.    EKG  EKG Interpretation  Date/Time:  Monday August 31 2019 01:47:20 EDT Ventricular Rate:  79 PR Interval:    QRS Duration: 150 QT Interval:  422 QTC Calculation: 484 R Axis:   128 Text Interpretation: Normal sinus rhythm Nonspecific intraventricular conduction delay Baseline wander in lead(s) V2 Confirmed by Rolland Porter [phone]) on 09/06/2019 6:11:53 AM  ED ECG REPORT   Date: 09/16/2019  04:01 AM  Rate: 70  Rhythm: normal sinus rhythm  QRS Axis: left  Intervals: normal  ST/T Wave abnormalities: normal  Conduction Disutrbances:right bundle branch block  Narrative Interpretation:   Old EKG Reviewed: unchanged from EKG about 3 hrs earlier  I have personally reviewed the EKG tracing and agree with the computerized printout as noted.     Radiology  CT Head Wo Contrast  Result Date: 09/21/2019 CLINICAL DATA:  New onset seizure EXAM: CT HEAD WITHOUT CONTRAST TECHNIQUE: Contiguous axial images were obtained from the base of the skull through the vertex without intravenous contrast. COMPARISON:  Head CT 03/08/2017. FINDINGS: Brain: There is hypoattenuation within the bilateral occipital white matter. This is new compared to 03/08/2017. No hemorrhage. No midline shift or other mass effect. No extra-axial collection or hydrocephalus. Vascular: Atherosclerotic calcification of the internal carotid arteries at the skull base. Skull: Normal. Negative for fracture or focal lesion. Sinuses/Orbits: No acute finding. Other: None. IMPRESSION: 1. Hypoattenuation within the bilateral occipital white matter is new compared to 03/08/2017 and may be a manifestation of posterior reversible encephalopathy syndrome (PRES). MRI of the brain with and without contrast is recommended for better characterization. 2. No hemorrhage or mass effect. Electronically Signed   By: Ulyses Jarred M.D.   On: 08/29/2019 03:43   DG Chest Portable 1 View  Result Date: 09/11/2019 CLINICAL DATA:  Intubation EXAM: PORTABLE CHEST 1 VIEW COMPARISON:  08/03/2019 FINDINGS: Cardiomegaly without interval change when allowing for rotation. There has been CABG. The endotracheal tube tip appears halfway between the clavicular heads and carina. The orogastric tube reaches the stomach. Low volume chest with mild interstitial crowding. There is no edema, consolidation, effusion, or pneumothorax. Artifact from overlapping hardware. IMPRESSION: 1. Unremarkable hardware positioning. 2. No evidence of acute cardiopulmonary disease. Electronically Signed   By: Monte Fantasia M.D.   On: 09/22/2019 05:38   DG Abd Portable 1 View  Result Date: 09/05/2019 CLINICAL DATA:  NG tube placement EXAM: PORTABLE ABDOMEN - 1 VIEW COMPARISON:  06/25/2019 FINDINGS: The NG tube tip is in the antral region of the stomach. The  bowel gas pattern is unremarkable. No findings for obstruction or perforation. The soft tissue shadows are maintained. Extensive vascular calcifications. IMPRESSION: NG tube tip is in the antral region of the stomach. Electronically Signed   By: Marijo Sanes M.D.   On: 09/15/2019 05:38    Procedures Procedure Name: Intubation Date/Time: 09/10/2019 4:53 AM Performed by: Rolland Porter, MD Pre-anesthesia Checklist: Patient identified, Patient being monitored, Timeout performed, Emergency Drugs available and Suction available Oxygen Delivery Method: Non-rebreather mask Preoxygenation: Pre-oxygenation with 100% oxygen Induction Type: Rapid sequence Ventilation: Mask ventilation without difficulty Laryngoscope Size: Glidescope and 3 Grade View: Grade II Tube type: Non-subglottic suction tube Tube size: 7.0 mm Number of attempts: 1 Placement Confirmation: ETT inserted through vocal cords under direct vision,  Positive ETCO2 and Breath sounds checked- equal and bilateral Secured at: 22 cm Tube secured with:  ETT holder Dental Injury: Teeth and Oropharynx as per pre-operative assessment      .Critical Care Performed by: Rolland Porter, MD Authorized by: Rolland Porter, MD   Critical care provider statement:    Critical care time (minutes):  35   Critical care was necessary to treat or prevent imminent or life-threatening deterioration of the following conditions:  CNS failure or compromise   Critical care was time spent personally by me on the following activities:  Discussions with consultants, evaluation of patient's response to treatment, examination of patient, ordering and review of laboratory studies, ordering and review of radiographic studies, pulse oximetry and re-evaluation of patient's condition   (including critical care time)  Medications Ordered in ED Medications  nicardipine (CARDENE) 20mg  in 0.86% saline 254ml IV infusion (0.1 mg/ml) (7.5 mg/hr Intravenous Rate/Dose Change 09/24/2019  0703)  midazolam (VERSED) 50 mg/50 mL (1 mg/mL) premix infusion (4.5 mg/hr Intravenous Rate/Dose Change 09/13/2019 0618)  levETIRAcetam (KEPPRA) IVPB 1000 mg/100 mL premix (0 mg Intravenous Stopped 09/22/2019 0350)  LORazepam (ATIVAN) injection 1 mg (1 mg Intravenous Not Given 09/16/2019 0300)  etomidate (AMIDATE) injection (10 mg Intravenous Given 08/30/2019 0447)  succinylcholine (ANECTINE) injection (100 mg Intravenous Given 09/25/2019 0447)    ED Course  I have reviewed the triage vital signs and the nursing notes.  Pertinent labs & imaging results that were available during my care of the patient were reviewed by me and considered in my medical decision making (see chart for details).    MDM Rules/Calculators/A&P                     Patient's rhythmic jerking of her left upper extremity stopped after about 1-1/2 minutes.  We were going to give her Ativan and then when it stopped I was just going to start her on IV Keppra however she started having the rhythmic jerking again and she was given Ativan IV.  She was given a bolus of Keppra.  CT of the head was done to evaluate for new onset seizure.  Patient was rechecked after her seizure however she remains unresponsive.  Decision was made to put her on a ventilator to protect her airway.  The radiologist has read her CT is suspicious for pres syndrome, her blood pressure prior to going for intubation was 196/81, however it had been over 200 shortly after she arrived to the ED.  Cardene drip was ordered.  Her eyes were midline at this point.  She was noted to be moving her right upper extremity.  As we were preparing to intubate patient I noted that she was not using her left arm or her left leg at all.  She was moving her right arm and her right leg.  She would sort of respond to verbal stimuli and would semiopen her eyes.  However she was not verbal and did not follow commands.  Just prior to intubating patient she started having her eyes deviate to the left.   There was no seizure activity seen at this point.  4:50 AM right after intubation his blood pressure shot back up to 989 systolic.  She was started  on the Cardene drip.  She also was put on a Versed drip for sedation due to her seizures.  5:13 AM I talked to Dr. Genevive Bi, critical care, she is going to accept for admission.  However [facility] cannot tell us where the ICU beds are and will call back.  [facility] called back and said she has  a bed in ICU at [facility] that will not be available until after 8 AM.  6:25 AM patient's blood pressure is 125/61   I discussed with her nurse that the goal should be 353-614 systolic.  43:15 BP 400/86, pt moving right arm and leg, not moving the left. Has some eye movements, but not purposeful.   Dr Sedonia Small made aware of patient waiting to be transported to [facility] for admission.   Final Clinical Impression(s) / ED Diagnoses Final diagnoses:  Somnolence  Seizure ([place])  Hypertensive crisis  PRES (posterior reversible encephalopathy syndrome)    Rx / DC Orders  Plan admission  Rolland Porter, MD, Barbette Or, MD 08/30/2019 [phone]

## 2019-08-31 NOTE — Procedures (Deleted)
Hemodialysis Insertion Procedure Note [name] [id number] 1941/05/05  Procedure: Insertion of Hemodialysis Catheter Type: 3 port  Indications: Hemodialysis   Procedure Details Consent: Risks of procedure as well as the alternatives and risks of each were explained to the (patient/caregiver).  Consent for procedure obtained. Time Out: Verified patient identification, verified procedure, site/side was marked, verified correct patient position, special equipment/implants available, medications/allergies/relevent history reviewed, required imaging and test results available.  Performed  Maximum sterile technique was used including antiseptics, cap, gloves, gown, hand hygiene, mask and sheet. Skin prep: Chlorhexidine; local anesthetic administered A antimicrobial bonded/coated triple lumen catheter was placed in the right internal jugular vein using the Seldinger technique. Ultrasound guidance used.Yes.   Catheter placed to 16 cm. Blood aspirated via all 3 ports and then flushed x 3. Line sutured x 2 and dressing applied.  Evaluation Blood flow good Complications: No apparent complications Patient did tolerate procedure well. Chest X-ray ordered to verify placement.  CXR: normal.  Marianna Payment, D.O. Date 09/25/2019 Time 2:24 PM [facility] Internal Medicine, PGY-1 Pager: [phone]

## 2019-08-31 NOTE — Progress Notes (Signed)
Patient transferred from [facility]. Placed on vent at this time with same settings.

## 2019-08-31 NOTE — Progress Notes (Signed)
MEDICATION RELATED CONSULT NOTE - INITIAL   Pharmacy Consult for phenytoin  Indication: Seizures  Allergies  Allergen Reactions  . Insulin Glargine Swelling    "Makes me swell like a balloon all over", including face, but without any respiratory distress or rashes. Associated with weight gain.  . Statins Other (See Comments)    "I've tried them all; my muscle aches were so bad I couldn't walk".  . Other     Patient Measurements: Height: 5\' 5"  (165.1 cm) Weight: 72.6 kg (160 lb) IBW/kg (Calculated) : 57 Adjusted Body Weight: 63 kg    Assessment: 79 yo female admission with AMS, rhythmic jerking of left upper extremity, seizures, and concern of PRES based on CT findings. 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 Patient loaded with fosphenytoin 20mg /kg. Pharmacy consulted to dose phenytoin. Will dose based on ABW due to TBW >20% IBW.   Of note, patient is also on amiodarone which can increase phenytoin concentration and warfarin which can increase risk of bleeding when starting phenytoin and with prolonged warfarin and phenytoin administration cause a decreased effect of warfarin.   Goal of Therapy:  Total phenytoin level 10-20 mcg/mL Free phenytoin level 1-2 mcg/mL  Plan:  Start phenytoin 100mg  q8h - starting 12 hours after fosphenytoin load  Follow up effect on warfarin  Follow up obtaining a phenytoin level   Henri Medal 09/08/2019,2:49 PM

## 2019-09-01 ENCOUNTER — Inpatient Hospital Stay (HOSPITAL_COMMUNITY): Payer: Medicare HMO

## 2019-09-01 ENCOUNTER — Other Ambulatory Visit (INDEPENDENT_AMBULATORY_CARE_PROVIDER_SITE_OTHER): Payer: Medicare HMO

## 2019-09-01 LAB — POCT I-STAT 7, (LYTES, BLD GAS, ICA,H+H)
Bicarbonate: 23.4 mmol/L (ref 20.0–28.0)
Calcium, Ion: 1.43 mmol/L — ABNORMAL HIGH (ref 1.15–1.40)
HCT: 34 % — ABNORMAL LOW (ref 36.0–46.0)
Hemoglobin: 11.6 g/dL — ABNORMAL LOW (ref 12.0–15.0)
O2 Saturation: 98 %
Patient temperature: 98.2
Potassium: 4.1 mmol/L (ref 3.5–5.1)
Sodium: 130 mmol/L — ABNORMAL LOW (ref 135–145)
TCO2: 24 mmol/L (ref 22–32)
pCO2 arterial: 32.2 mmHg (ref 32.0–48.0)
pH, Arterial: 7.469 — ABNORMAL HIGH (ref 7.350–7.450)
pO2, Arterial: 94 mmHg (ref 83.0–108.0)

## 2019-09-01 LAB — GLUCOSE, CAPILLARY
Glucose-Capillary: 166 mg/dL — ABNORMAL HIGH (ref 70–99)
Glucose-Capillary: 88 mg/dL (ref 70–99)
Glucose-Capillary: 61 mg/dL — ABNORMAL LOW (ref 70–99)
Glucose-Capillary: 101 mg/dL — ABNORMAL HIGH (ref 70–99)
Glucose-Capillary: 83 mg/dL (ref 70–99)
Glucose-Capillary: 80 mg/dL (ref 70–99)
Glucose-Capillary: 76 mg/dL (ref 70–99)

## 2019-09-01 LAB — COMPREHENSIVE METABOLIC PANEL
ALT: 10 U/L (ref 0–44)
AST: 21 U/L (ref 15–41)
Albumin: 2.9 g/dL — ABNORMAL LOW (ref 3.5–5.0)
Alkaline Phosphatase: 138 U/L — ABNORMAL HIGH (ref 38–126)
Anion gap: 13 (ref 5–15)
BUN: 15 mg/dL (ref 8–23)
CO2: 27 mmol/L (ref 22–32)
Calcium: 9.2 mg/dL (ref 8.9–10.3)
Chloride: 95 mmol/L — ABNORMAL LOW (ref 98–111)
Creatinine, Ser: 2.79 mg/dL — ABNORMAL HIGH (ref 0.44–1.00)
GFR calc Af Amer: 18 mL/min — ABNORMAL LOW (ref >60)
GFR calc non Af Amer: 15 mL/min — ABNORMAL LOW (ref >60)
Glucose, Bld: 82 mg/dL (ref 70–99)
Potassium: 2.9 mmol/L — ABNORMAL LOW (ref 3.5–5.1)
Sodium: 135 mmol/L (ref 135–145)
Total Bilirubin: 0.3 mg/dL (ref 0.3–1.2)
Total Protein: 5.5 g/dL — ABNORMAL LOW (ref 6.5–8.1)

## 2019-09-01 LAB — CBC
HCT: 35.7 % — ABNORMAL LOW (ref 36.0–46.0)
Hemoglobin: 10.9 g/dL — ABNORMAL LOW (ref 12.0–15.0)
MCH: 30.8 pg (ref 26.0–34.0)
MCHC: 30.5 g/dL (ref 30.0–36.0)
MCV: 100.8 fL — ABNORMAL HIGH (ref 80.0–100.0)
Platelets: 485 10*3/uL — ABNORMAL HIGH (ref 150–400)
RBC: 3.54 MIL/uL — ABNORMAL LOW (ref 3.87–5.11)
RDW: 19.4 % — ABNORMAL HIGH (ref 11.5–15.5)
WBC: 12.7 10*3/uL — ABNORMAL HIGH (ref 4.0–10.5)
nRBC: 0 % (ref 0.0–0.2)

## 2019-09-01 LAB — BASIC METABOLIC PANEL
Anion gap: 20 — ABNORMAL HIGH (ref 5–15)
BUN: 40 mg/dL — ABNORMAL HIGH (ref 8–23)
CO2: 22 mmol/L (ref 22–32)
Calcium: 10.8 mg/dL — ABNORMAL HIGH (ref 8.9–10.3)
Chloride: 93 mmol/L — ABNORMAL LOW (ref 98–111)
Creatinine, Ser: 6.19 mg/dL — ABNORMAL HIGH (ref 0.44–1.00)
GFR calc Af Amer: 7 mL/min — ABNORMAL LOW (ref >60)
GFR calc non Af Amer: 6 mL/min — ABNORMAL LOW (ref >60)
Glucose, Bld: 137 mg/dL — ABNORMAL HIGH (ref 70–99)
Potassium: 4.1 mmol/L (ref 3.5–5.1)
Sodium: 135 mmol/L (ref 135–145)

## 2019-09-01 LAB — PHENOBARBITAL LEVEL: Phenobarbital: 34.3 ug/mL — ABNORMAL HIGH (ref 15.0–30.0)

## 2019-09-01 LAB — TRIGLYCERIDES: Triglycerides: 205 mg/dL — ABNORMAL HIGH (ref <150)

## 2019-09-01 LAB — AMMONIA: Ammonia: 26 umol/L (ref 9–35)

## 2019-09-01 LAB — PROTIME-INR
INR: 1.9 — ABNORMAL HIGH (ref 0.8–1.2)
Prothrombin Time: 21.9 seconds — ABNORMAL HIGH (ref 11.4–15.2)

## 2019-09-01 LAB — MAGNESIUM: Magnesium: 1.9 mg/dL (ref 1.7–2.4)

## 2019-09-01 LAB — PHOSPHORUS: Phosphorus: 2.1 mg/dL — ABNORMAL LOW (ref 2.5–4.6)

## 2019-09-01 LAB — VALPROIC ACID LEVEL: Valproic Acid Lvl: 30 ug/mL — ABNORMAL LOW (ref 50.0–100.0)

## 2019-09-01 MED ORDER — SODIUM CHLORIDE 0.9 % IV SOLN
0.5000 mg/h | INTRAVENOUS | Status: DC
  Administered 2019-09-01: 40 mg/h via INTRAVENOUS
  Filled 2019-09-01: qty 50

## 2019-09-01 MED ORDER — POTASSIUM CHLORIDE 20 MEQ/15ML (10%) PO SOLN
40.0000 meq | Freq: Once | ORAL | Status: AC
  Administered 2019-09-01: 40 meq via ORAL
  Filled 2019-09-01: qty 30

## 2019-09-01 MED ORDER — MIDAZOLAM 50MG/50ML (1MG/ML) PREMIX INFUSION: 30.0000 mg/h | INTRAVENOUS | Status: DC

## 2019-09-01 MED ORDER — DEXTROSE 50 % IV SOLN
INTRAVENOUS | Status: AC
  Administered 2019-09-01: 12.5 g via INTRAVENOUS
  Filled 2019-09-01: qty 50

## 2019-09-01 MED ORDER — ALBUMIN HUMAN 25 % IV SOLN
25.0000 g | Freq: Once | INTRAVENOUS | Status: AC
  Administered 2019-09-01: 25 g via INTRAVENOUS
  Filled 2019-09-01: qty 100

## 2019-09-01 MED ORDER — SODIUM CHLORIDE 0.9 % IV SOLN
10.0000 mg | Freq: Every day | INTRAVENOUS | Status: DC
  Administered 2019-09-01 – 2019-09-03 (×3): 10 mg via INTRAVENOUS
  Filled 2019-09-01 (×3): qty 1

## 2019-09-01 MED ORDER — ALBUMIN HUMAN 5 % IV SOLN
INTRAVENOUS | Status: AC
  Filled 2019-09-01: qty 500

## 2019-09-01 MED ORDER — VITAL HIGH PROTEIN PO LIQD
1000.0000 mL | ORAL | Status: DC
  Administered 2019-09-01 – 2019-09-03 (×2): 1000 mL
  Filled 2019-09-01: qty 1000

## 2019-09-01 MED ORDER — CHLORHEXIDINE GLUCONATE CLOTH 2 % EX PADS
6.0000 | MEDICATED_PAD | Freq: Every day | CUTANEOUS | Status: DC
  Administered 2019-09-03: 6 via TOPICAL

## 2019-09-01 MED ORDER — SODIUM CHLORIDE 0.9 % IV SOLN
INTRAVENOUS | Status: DC
  Filled 2019-09-01 (×3): qty 250

## 2019-09-01 MED ORDER — SODIUM CHLORIDE 0.9 % IV SOLN
30.0000 mg/h | INTRAVENOUS | Status: DC
  Administered 2019-09-01: 30 mg/h via INTRAVENOUS
  Filled 2019-09-01 (×2): qty 50

## 2019-09-01 MED ORDER — VALPROIC ACID 250 MG/5ML PO SOLN
500.0000 mg | Freq: Three times a day (TID) | ORAL | Status: DC
  Administered 2019-09-01 – 2019-09-03 (×7): 500 mg
  Filled 2019-09-01 (×7): qty 10

## 2019-09-01 MED ORDER — WARFARIN SODIUM 1 MG PO TABS
1.0000 mg | ORAL_TABLET | Freq: Once | ORAL | Status: AC
  Administered 2019-09-01: 1 mg via ORAL
  Filled 2019-09-01: qty 1

## 2019-09-01 MED ORDER — MIDAZOLAM 50MG/50ML (1MG/ML) PREMIX INFUSION: 50.0000 mg/h | INTRAVENOUS | Status: DC

## 2019-09-01 MED ORDER — MIDAZOLAM 50MG/50ML (1MG/ML) PREMIX INFUSION
50.0000 mg/h | INTRAVENOUS | Status: DC
  Administered 2019-09-01: 50 mg/h via INTRAVENOUS
  Filled 2019-09-01: qty 50

## 2019-09-01 MED ORDER — MIDAZOLAM 50MG/50ML (1MG/ML) PREMIX INFUSION: 40.0000 mg/h | INTRAVENOUS | Status: DC

## 2019-09-01 MED ORDER — SODIUM CHLORIDE 0.9 % IV SOLN
50.0000 mg/h | INTRAVENOUS | Status: DC
  Administered 2019-09-01: 50 mg/h via INTRAVENOUS
  Administered 2019-09-02: 70 mg/h via INTRAVENOUS
  Filled 2019-09-01 (×2): qty 50

## 2019-09-01 MED ORDER — MIDAZOLAM HCL-SODIUM CHLORIDE 100-0.8 MG/100ML-% IV SOLN: 250.0000 mg | INTRAVENOUS | Status: DC

## 2019-09-01 MED ORDER — LEVETIRACETAM IN NACL 500 MG/100ML IV SOLN
500.0000 mg | INTRAVENOUS | Status: DC
  Administered 2019-09-02: 500 mg via INTRAVENOUS
  Filled 2019-09-01: qty 100

## 2019-09-01 MED ORDER — PRO-STAT SUGAR FREE PO LIQD
30.0000 mL | Freq: Three times a day (TID) | ORAL | Status: DC
  Administered 2019-09-01 – 2019-09-03 (×7): 30 mL
  Filled 2019-09-01 (×7): qty 30

## 2019-09-01 MED ORDER — DEXTROSE 50 % IV SOLN: 12.5000 g | INTRAVENOUS | Status: AC

## 2019-09-01 MED ORDER — VALPROIC ACID 250 MG/5ML PO SOLN
250.0000 mg | Freq: Three times a day (TID) | ORAL | Status: DC
  Administered 2019-09-01: 250 mg
  Filled 2019-09-01: qty 5

## 2019-09-01 NOTE — Progress Notes (Signed)
NAME:  [name], MRN:  [id number], DOB:  March 10, 1941, LOS: 1 ADMISSION DATE:  09/11/2019, CONSULTATION DATE: April 5 REFERRING MD: Dr. Tomi Bamberger EDP, CHIEF COMPLAINT: Altered mental status/PRES  Brief History   79 year old female from nursing home with altered mental status of 4/5.  Profoundly hypertensive in ED with CT concerning for PRES.  Intubated due to unresponsiveness transferred to [facility] for admission. She was given Ativan IV for possible seizure of the left upper extremity and shortly after was noted to be unresponsive, for which she was intubated. Started on nicardipine drip and transferred to [facility]  She was recently admitted 3/8-3/19 for AMS presumably secondary to home opioid medications. Also found to have thoracic spine compression fracture t6-t7. Underwent kyphoplasty 3/17.   Past Medical History   has a past medical history of Anemia, Arthritis, Blood transfusion without reported diagnosis, CAD (coronary artery disease), Cataract, Chronic anemia, Chronic combined systolic and diastolic CHF (congestive heart failure) ([place]), ESRD on hemodialysis ([place]), Essential hypertension, Gastritis, GERD (gastroesophageal reflux disease), Gout, Headache, Hyperlipidemia, Hypothyroidism, Iron deficiency anemia (11/08/2016), NSTEMI (non-ST elevated myocardial infarction) ([place]), Paroxysmal atrial fibrillation ([place]), Type 2 diabetes mellitus ([place]), and Wears dentures.   Significant Hospital Events   4/5- Admitted. EEG with status. Neuro consulted.  Given fosphenytoin, phenobarbital, Versed gtt, propofol gtt. started dopamine and nor epi for bradycardia and hypotension  Consults:  Neurology  Procedures:  Intubation 4/5 > Lt IJ 4/5 >  Significant Diagnostic Tests:  CT head 4/5> hypoattenuation within the bilateral occipital white matter is new and may be a manifestation of posterior reversible encephalopathy syndrome.  MRI recommended.  Micro Data:    Antimicrobials:     Interim  history/subjective:   Continues to be deeply sedated for burst suppression. Continues on dopamine, Levophed which is started overnight for bradycardia and hypotension  Objective   Blood pressure (!) 100/50, pulse 91, temperature 98.2 F (36.8 C), temperature source Axillary, resp. rate 14, height 5\' 5"  (1.651 m), weight 74.2 kg, SpO2 98 %.    Vent Mode: PRVC FiO2 (%):  [40 %] 40 % Set Rate:  [14 bmp] 14 bmp Vt Set:  [450 mL] 450 mL PEEP:  [5 cmH20] 5 cmH20 Plateau Pressure:  [14 cmH20-17 cmH20] 16 cmH20   Intake/Output Summary (Last 24 hours) at 09/01/2019 0853 Last data filed at 09/01/2019 4098 Gross per 24 hour  Intake 1665.57 ml  Output --  Net 1665.57 ml   Filed Weights   08/29/2019 0141 09/01/19 0705  Weight: 72.6 kg 74.2 kg    Examination: Gen:      No acute distress HEENT:  EOMI, sclera anicteric Neck:     No masses; no thyromegaly, ETT Lungs:    Clear to auscultation bilaterally; normal respiratory effort CV:         Regular rate and rhythm; no murmurs Abd:      + bowel sounds; soft, non-tender; no palpable masses, no distension Ext:    No edema; adequate peripheral perfusion Skin:      Warm and dry; no rash Neuro: Sedated  Labs significant for creatinine 6.09, Phos 2.1, WBC 12.7 Chest x-ray 4/6-bibasilar atelectasis with small effusions  Resolved Hospital Problem list     Assessment & Plan:   Acute encephalopathy secondary to PRESS syndrome, status epilepticus Continue EEG monitoring Antiepileptics per neurology MRI when stable  PAF: warfarin, subtherapeutic Continue warfarin per tube with pharmacy dosing.  Telemetry monitoring Hold amio due to bradycardia  ESRD on HD: MWF schedule For  dialysis today per nephrology  DM: CBG monitoring and SSI  Hypertensive crisis Off nicardipine drip.  Now on pressors due to bradycardia and hypotension secondary to sedation Continue Levophed, dopamine  CAD s/p CABG HFpEF Tele monitoring  Hypothyroid Continue  home NP thyroid 120mg  via tube.   Best practice:  Diet: NPO Pain/Anxiety/Delirium protocol (if indicated): Propofol, versed infusion VAP protocol (if indicated): per protocol DVT prophylaxis: warfarin GI prophylaxis: NA Glucose control: SSI Mobility: BR Code Status: FULL Family Communication: Son updated Disposition: ICU   Critical care time:    The patient is critically ill with multiple organ system failure and requires high complexity decision making for assessment and support, frequent evaluation and titration of therapies, advanced monitoring, review of radiographic studies and interpretation of complex data.   Critical Care Time devoted to patient care services, exclusive of separately billable procedures, described in this note is 35 minutes.   Marshell Garfinkel MD [facility] Please see [email or web address] for pager details.  09/01/2019, 9:04 AM

## 2019-09-01 NOTE — Procedures (Addendum)
Patient Name: [name]  MRN: [id number]  Epilepsy Attending: Lora Havens  Referring Physician/Provider: Dr Zeb Comfort Duration: 09/19/2019 1305 to 09/01/2019 1305  Patient history: 79 year old female noted to have altered mental status.  In the ED noted a rhythmic jerking of left upper extremity.  CT head concerning for PRES. EEG to evaluate for seizures.  Level of alertness: Comatose  AEDs during EEG study: versed, propofol, LEV, VPA  Technical aspects: This EEG study was done with scalp electrodes positioned according to the 10-20 International system of electrode placement. Electrical activity was acquired at a sampling rate of 500Hz  and reviewed with a high frequency filter of 70Hz  and a low frequency filter of 1Hz . EEG data were recorded continuously and digitally stored.   Description: EEG initially showed bilateral (right more than left) posterior quadrant polyspikes lasting 2 to 10 seconds alternating with generalized low amplitude 13 to 15 Hz beta activity. As sedation was increased after around midnight on 09/01/2019 EEG showed continuous generalized background attenuation admixed with 15-18Hz  generalized beta activity. After around 9am on 09/01/2019, EEG again showed bilateral (right more than left) posterior quadrant polyspikes lasting 2 to 10 seconds alternating with generalized low amplitude 13 to 15 Hz beta activity.  Hyperventilation and photic stimulation were not performed.  Abnormality - Nonconvulsive status epilepticus, bilateral (right more than left) posterior quadrant - Background attenuation, generalized  - Excessive beta, generalized  IMPRESSION: This study initially showed evidence of nonconvulsive status epilepticus arising from bilateral (right more than left) posterior quadrant. After around midnight on 09/01/2019, seizures resolved and EEG showed profound diffuse encephalopathy, nonspecific etiology but likely secondary to sedation. Again after around  9am on 09/01/2019, EEG showed non convulsive status again.   [name] Barbra Sarks

## 2019-09-01 NOTE — Progress Notes (Signed)
MEDICATION RELATED CONSULT NOTE   Pharmacy Consult for Valproic Acid Indication: Status Epilepticus  Allergies  Allergen Reactions  . Insulin Glargine Swelling    "Makes me swell like a balloon all over", including face, but without any respiratory distress or rashes. Associated with weight gain.  . Statins Other (See Comments)    "I've tried them all; my muscle aches were so bad I couldn't walk".  . Other     Patient Measurements: Height: '5\' 5"'  (165.1 cm) Weight: 74.2 kg (163 lb 9.3 oz) IBW/kg (Calculated) : 57   Labs: Recent Labs    09/25/2019 0147 09/01/19 0431 09/01/19 0602  WBC 8.2  --  12.7*  HGB 11.1* 11.6* 10.9*  HCT 36.7 34.0* 35.7*  PLT 386  --  485*  CREATININE 5.16*  --  6.19*  MG  --   --  1.9  PHOS  --   --  2.1*   Estimated Creatinine Clearance: 7.4 mL/min (A) (by C-G formula based on SCr of 6.19 mg/dL (H)).   Medications:  Scheduled:  . amiodarone  200 mg Per Tube Daily  . atropine      . atropine  1 mg Intravenous Once  . chlorhexidine gluconate (MEDLINE KIT)  15 mL Mouth Rinse BID  . Chlorhexidine Gluconate Cloth  6 each Topical Q0600  . insulin aspart  0-9 Units Subcutaneous Q4H  . mouth rinse  15 mL Mouth Rinse 10 times per day  . sodium chloride flush  10-40 mL Intracatheter Q12H  . thyroid  120 mg Per Tube QAC breakfast  . valproic acid  250 mg Per Tube TID  . Warfarin - Pharmacist Dosing Inpatient   Does not apply q1600    Assessment: 79 yo female admission with AMS, rhythmic jerking of left upper extremity, seizures, and concern of PRES based on CT findings. 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 Patient loaded with fosphenytoin yesterday with plan to start phenytoin after though became bradycardic during fosphenytoin load. Patient then received phenobarbital load. Today, neurology is starting valproic acid and asked pharmacy to assist with dosing. Patient received valproic acid 1417m x1 yesterday evening.  Patient is being started on oral valproic acid 2559mthree times daily which is ~1018mg/day.   Of note, patient is ESRD on HD and will monitor by free valproic acid levels due to decreased protein binding.   Goal of Therapy:  Free valproic acid level: 5-72m3ml  Plan:  Start valproic acid 250mg14mthree times daily  Obtain baseline CMP  Obtain free valproic acid as appropriate  GraceCristela FeltrmD PGY1 Pharmacy Resident [employer]: [phone]/2021,8:00 AM

## 2019-09-01 NOTE — Progress Notes (Signed)
[facility] Progress Note  Subjective: pt had seizures and was started on IV propofol and versed, possibly got some IV phenobarb as well per neuro notes. BP's dropped , got dopamine and levo support.  Had HD this am.   Vitals:   09/01/19 1125 09/01/19 1130 09/01/19 1145 09/01/19 1200  BP: (!) 117/47 (!) 104/46 (!) 108/47 (!) 111/45  Pulse: 81 81 81 79  Resp: _0 Temp:    98.3 F (36.8 C)  TempSrc:    Axillary  SpO2: 100% 100% 100% 100%  Weight:      Height:        Exam: Gen on vent, sedated  No jvd or bruits Chest clear bilat RRR no MRG Abd soft ntnd no mass or ascites +bs MS no joint effusions or deformity Ext 1+ hip and pretib edema Neuro is not responsive, sedated on vent RUA AVF+ bruit   Home meds:  - aspirin 81/ warfarin 34m pm  - amiodarone 200 qd  - phoslo 3 ac tid  - prilosec 20/ febuxostat 40 qd  - evolocumab 140 mg sq q2wks  - prn's/ vitamins/ supplements     admit labs > Na 134  K 4.2  Bun 34   Creat 5.1   Ca 10.8/ 12.4   Hb 11    CXR - no active disease   Outpt HD: [name] MWF    4h  300/600   71.5kg   2/2.25 bath  Hep none  RUE AVF      Assessment/ Plan: 1. AMS - possible PRES seen on admit CT scan, high BP's (161/123) on presentation. Got IV Cleveprex w/ improved BP's. Some vol overload/ periph edema on exam. CXR no edema. Last night 6/5 pt went into shock and is requiring pressors now.  2. Seizures - per EEG. Plans per neuro, on IV versed/ propofol drips, VPA ^'d, Keppra continued. Goal SBP < 140/90. 3. ESRD - usual HD MWF. HD this am off sched and HD tomorrow to get back on schedule 4. HTN/ vol - not on BP meds at home. Could be vol- related if pt losing body wt. Will lower vol w/ HD as tolerated 5. Gout  6. Anemia ckd - Hb > 11, no need esa 7. MBD ckd - cont binder, get records 8. VDRF - per primary team, for airway protection. CXR neg.       [name] 09/01/2019, 12:55 PM   Recent Labs  Lab  09/18/2019 0147 09/01/19 0431 09/01/19 0602 09/01/19 0840  K   < > 4.1 4.1 2.9*  BUN   < >  --  40* 15  CREATININE   < >  --  6.19* 2.79*  CALCIUM   < >  --  10.8* 9.2  PHOS  --   --  2.1*  --   HGB  --  11.6* 10.9*  --    < > = values in this interval not displayed.   Inpatient medications: . atropine  1 mg Intravenous Once  . chlorhexidine gluconate (MEDLINE KIT)  15 mL Mouth Rinse BID  . Chlorhexidine Gluconate Cloth  6 each Topical Q0600  . insulin aspart  0-9 Units Subcutaneous Q4H  . mouth rinse  15 mL Mouth Rinse 10 times per day  . sodium chloride flush  10-40 mL Intracatheter Q12H  . thyroid  120 mg Per Tube QAC breakfast  . valproic acid  500 mg Per Tube TID  . warfarin  1 mg Oral ONCE-1600  . Warfarin - Pharmacist Dosing Inpatient   Does not apply q1600   . DOPamine 10 mcg/kg/min (09/01/19 1200)  . famotidine (PEPCID) IV    . levETIRAcetam Stopped (09/01/19 1118)  . levETIRAcetam    . midazolam 30 mg/hr (09/01/19 1200)  . niCARDipine Stopped (08/28/2019 1105)  . norepinephrine (LEVOPHED) Adult infusion 2 mcg/min (09/01/19 1223)  . propofol (DIPRIVAN) infusion 40 mcg/kg/min (09/01/19 1200)   fentaNYL (SUBLIMAZE) injection, fentaNYL (SUBLIMAZE) injection, sodium chloride flush

## 2019-09-01 NOTE — Progress Notes (Signed)
ANTICOAGULATION CONSULT NOTE - Follow Up Consult  Pharmacy Consult for warfarin Indication: atrial fibrillation  Allergies  Allergen Reactions  . Insulin Glargine Swelling    "Makes me swell like a balloon all over", including face, but without any respiratory distress or rashes. Associated with weight gain.  . Statins Other (See Comments)    "I've tried them all; my muscle aches were so bad I couldn't walk".  . Other     Patient Measurements: Height: 5\' 5"  (165.1 cm) Weight: 74.2 kg (163 lb 9.3 oz) IBW/kg (Calculated) : 57  Vital Signs: Temp: 98.2 F (36.8 C) (04/06 0800) Temp Source: Axillary (04/06 0800) BP: 113/53 (04/06 0900) Pulse Rate: 89 (04/06 0900)  Labs: Recent Labs    09/22/2019 0147 09/18/2019 0147 09/01/2019 0348 09/01/19 0431 09/01/19 0602  HGB 11.1*   < >  --  11.6* 10.9*  HCT 36.7  --   --  34.0* 35.7*  PLT 386  --   --   --  485*  LABPROT 18.0*  --   --   --  21.9*  INR 1.5*  --   --   --  1.9*  CREATININE 5.16*  --   --   --  6.19*  TROPONINIHS 63*  --  62*  --   --    < > = values in this interval not displayed.    Estimated Creatinine Clearance: 7.4 mL/min (A) (by C-G formula based on SCr of 6.19 mg/dL (H)).   Medical History: Past Medical History:  Diagnosis Date  . Anemia   . Arthritis   . Blood transfusion without reported diagnosis   . CAD (coronary artery disease)    a. 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  . Cataract   . Chronic anemia   . Chronic combined systolic and diastolic CHF (congestive heart failure) ([place])    a. 2D echo 08/2016 at [facility]: EF 50-55% with inferior wall HK, impaired LV filling, fair study.  . ESRD on hemodialysis ([place])   . Essential hypertension   . Gastritis   . GERD (gastroesophageal reflux disease)   . Gout   . Headache   . Hyperlipidemia   . Hypothyroidism   . Iron deficiency anemia 11/08/2016  . NSTEMI (non-ST elevated myocardial infarction) ([place])    a. Complex admission  08/2016 - with severe hyperglycemia, AKI on CKD, severe anemia down to Hgb 6.8, acute combined CHF, troponin of 8.5, cath deferred due to renal dysfunction.  . Paroxysmal atrial fibrillation ([place])   . Type 2 diabetes mellitus ([place])   . Wears dentures      Assessment: 79 yo female admitted on 08/29/2019 with altered mental status, rhythmic jerking of L upper extremity and concern of PRES based on CT head. No hemorrhage noted on CT head. Patient is on warfarin prior to admission for Afib. Pharmacy consulted to dose warfarin.    Prior to admission warfarin 4mg  daily per chart notes though appears patient was holding warfarin since 3/31 due to supratherapeutic INR of 5.9. Confirmed with [facility] in [place] that patient was not currently taking warfarin.   Today, INR 1.9 is subtherapeutic but increasing. Patient is starting valproic acid today which can increase INR due to decreased protein binding of warfarin. No reported bleeding. CBC stable.   Goal of Therapy:  INR 2-3 Monitor platelets by anticoagulation protocol: Yes   Plan:  Warfarin 1mg  x1 tonight  Monitor INR, CBC, and S/S of bleeding daily  Cristela Felt, PharmD PGY1 Pharmacy Resident [employer]: [phone]   09/01/2019,9:01 AM

## 2019-09-01 NOTE — Progress Notes (Signed)
Initial Nutrition Assessment  DOCUMENTATION CODES:   Not applicable  INTERVENTION:   Vital High Protein @ 30 ml/hr (720 ml/day) via OG tube 30 ml Prostat TID  Provides: 1020 kcal, 108 grams protein, and 601 ml free water.   TF regimen and propofol at current rate providing 1706 total kcal/day    NUTRITION DIAGNOSIS:   Increased nutrient needs related to chronic illness(ESRD on iHD, seizures) as evidenced by estimated needs.  GOAL:   Patient will meet greater than or equal to 90% of their needs  MONITOR:   TF tolerance, Vent status  REASON FOR ASSESSMENT:   Consult, Ventilator Enteral/tube feeding initiation and management  ASSESSMENT:   Pt with a PMH of DM, aftib, HTN, CHF, CAD s/p CABG 10/2018, ESRD on iHD who was at [facility] for rehab after falls and now admitted for Acute encephalopathy secondary to hypertensive emergency/PRESS and seizures.   Pt discussed during ICU rounds and with RN.  Per MD pt in shock secondary to heavy sedation for ongoing seizures, on pressors as needed.  Per Renal due to hypotension unable to pull much fluid during iHD.  Spoke with pt's husband at bedside. Per husband pt has been at rehab in SNF and he reports that pt has not been very mobile. Noted muscle depletion in BLE likely related to this.  Per husband pt had had several falls prior to going to rehab. He has not been able to see her very much during her stay due to [name].   Patient is currently intubated on ventilator support MV: 6.3 L/min Temp (24hrs), Avg:96.9 F (36.1 C), Min:94.4 F (34.7 C), Max:98 F (36.7 C)  Propofol: 26 ml/hr (60 mcg) provides: 686 kcal  Medications reviewed and include: novolog SSI Levophed @ 2 mcg  Ketamine @ 1 mg  Dopamine @ 20 mcg  Labs reviewed: Na 132 (L), TG: 772 CBG's: 782-95-62 UF: 1006 ml   (4/6)  EDW: 71.5 kg   NUTRITION - FOCUSED PHYSICAL EXAM:    Most Recent Value  Orbital Region  No depletion  Upper Arm Region  No depletion  Thoracic  and Lumbar Region  No depletion  Buccal Region  Unable to assess  Temple Region  No depletion  Clavicle Bone Region  No depletion  Clavicle and Acromion Bone Region  No depletion  Scapular Bone Region  Unable to assess  Dorsal Hand  No depletion  Patellar Region  Mild depletion  Anterior Thigh Region  Moderate depletion  Posterior Calf Region  Mild depletion  Edema (RD Assessment)  Mild  Hair  Reviewed  Eyes  Unable to assess  Mouth  Unable to assess  Skin  Reviewed  Nails  Reviewed       Diet Order:   Diet Order            Diet NPO time specified  Diet effective now              EDUCATION NEEDS:   No education needs have been identified at this time  Skin:  Skin Integrity Issues:: Stage II, Diabetic Ulcer Stage II: R buttocks Diabetic Ulcer: R foot  Last BM:  PTA  Height:   Ht Readings from Last 1 Encounters:  08/30/2019 5\' 5"  (1.651 m)    Weight:   Wt Readings from Last 1 Encounters:  09/02/19 73.1 kg    Ideal Body Weight:  56.8 kg  BMI:  Body mass index is 26.82 kg/m.  Estimated Nutritional Needs:   Kcal:  1700  Protein:  105-115 grams  Fluid:  1.2 L  Briana Muhs P., RD, LDN, CNSC See AMiON for contact information

## 2019-09-01 NOTE — Progress Notes (Signed)
EEG with continuing polyspikes, possible seizures. HR in upper 60s. SBP in 120s. Pressors ordered by PCCM   Recs: -Versed - increase to 60/hr -Propofol - increase to 80. Will follow -- Amie Portland, MD [facility] Pager: [phone]

## 2019-09-01 NOTE — Progress Notes (Signed)
Subjective: Status epilepticus improved overnight. On dialysis today  ROS: unable to obtain due to poor mental status  Examination  Vital signs in last 24 hours: Temp:  [94.5 F (34.7 C)-98.2 F (36.8 C)] 98 F (36.7 C) (04/06 1007) Pulse Rate:  [9-91] 87 (04/06 1030) Resp:  [12-16] 14 (04/06 1030) BP: (85-146)/(42-70) 141/56 (04/06 1030) SpO2:  [98 %-100 %] 99 % (04/06 1030) FiO2 (%):  [40 %] 40 % (04/06 0824) Weight:  [73.2 kg-74.2 kg] 73.2 kg (04/06 1007)  General: lying in bed, NAD CVS: pulse-normal rate and rhythm RS: breathing comfortably, intubated Extremities: normal,  warm   Neuro: MS: comatose, doesn't open eyes to noxious stimuli CN: pupils equal and reacting to light, corneal reflex absent, oculocephalic reflex absent, gag reflex absent Motor: doesn't withdraw to noxious stimuli in all 4 extremities, absent  Basic Metabolic Panel: Recent Labs  Lab 08/25/19 1334 08/25/19 1334 09/21/2019 0147 09/01/19 0431 09/01/19 0602 09/01/19 0840  NA 133*  --  134* 130* 135 135  K 4.0  --  4.2 4.1 4.1 2.9*  CL 94*  --  95*  --  93* 95*  CO2 28  --  28  --  22 27  GLUCOSE 127*  --  113*  --  137* 82  BUN 27*  --  34*  --  40* 15  CREATININE 4.16*  --  5.16*  --  6.19* 2.79*  CALCIUM 10.8*   < > 12.4*  --  10.8* 9.2  MG  --   --   --   --  1.9  --   PHOS  --   --   --   --  2.1*  --    < > = values in this interval not displayed.    CBC: Recent Labs  Lab 08/25/19 1334 09/02/2019 0147 09/01/19 0431 09/01/19 0602  WBC 7.1 8.2  --  12.7*  HGB 9.4* 11.1* 11.6* 10.9*  HCT 29.3* 36.7 34.0* 35.7*  MCV 93.6 101.9*  --  100.8*  PLT 336 386  --  485*     Coagulation Studies: Recent Labs    08/30/2019 0147 09/01/19 0602  LABPROT 18.0* 21.9*  INR 1.5* 1.9*    Imaging CT head 09/25/2019: Hypoattenuation within the bilateral occipital white matter is new compared to 03/08/2017 and may be a manifestation of posterior reversible encephalopathy syndrome (PRES).    ASSESSMENT AND PLAN: 79 year old female with posterior reversible encephalopathy syndrome and possible seizure requiring intubation after receiving Ativan.   Refractory New onset Non-convulsive status epilepticus  PRES Acute toxic-metabolic encephalopathy Acute respiratory failure End-stage renal disease on dialysis Hypoalbuminemia with hypoproteinemia Hypokalemia Leukocytosis Anemia Thrombocytosis - LTM eeg improved overnight after propofol and versed. However, again started having subclinical seizures this morning  Recommendations - Increase versed to 1ml/hr and continue propofol at 41mcg/hr - VPA level 30, will increase VPA to 500mg  TID - Continue Keppra 1000mg  BID ( adjusted for renal dysfunction, additional 500mg  after dialysis) -Strict blood pressure control with goal Systolic BP less than 992 and diastolic EQ<68 , currently patient is hypotensive -We will obtain MRI brain without contrast after LTM can be discontinued -As needed IV Versed 5 mg for clinical seizure-like activity -Management of rest of the comorbidities per primary team  CRITICAL CARE Performed by: Lora Havens   Total critical care time: 35 minutes  Critical care time was exclusive of separately billable procedures and treating other patients.  Critical care was necessary to treat or prevent  imminent or life-threatening deterioration.  Critical care was time spent personally by me on the following activities: development of treatment plan with patient and/or surrogate as well as nursing, discussions with consultants, evaluation of patient's response to treatment, examination of patient, obtaining history from patient or surrogate, ordering and performing treatments and interventions, ordering and review of laboratory studies, ordering and review of radiographic studies, pulse oximetry and re-evaluation of patient's condition.

## 2019-09-01 NOTE — Progress Notes (Signed)
EEG with continuing subclinical seizures alhough frequency and duration has somewhat decreased since around 11 PM.  Recommendations: Heart rate is now stable in the 80s with dopamine and norepinephrine. Increase propofol to 70 mcg/kg/min Increase Versed to 30mg /hr  We will continue to follow   -- Amie Portland, MD [facility] Pager: [phone] If 7pm to 7am, please call on call as listed on AMION.

## 2019-09-01 NOTE — Progress Notes (Signed)
LTM maint complete - skin breakdown under: F8. Skin check at FP1,FP2

## 2019-09-02 ENCOUNTER — Inpatient Hospital Stay (HOSPITAL_COMMUNITY): Payer: Medicare HMO

## 2019-09-02 DIAGNOSIS — G40911 Epilepsy, unspecified, intractable, with status epilepticus: Secondary | ICD-10-CM

## 2019-09-02 LAB — GLUCOSE, CAPILLARY
Glucose-Capillary: 66 mg/dL — ABNORMAL LOW (ref 70–99)
Glucose-Capillary: 147 mg/dL — ABNORMAL HIGH (ref 70–99)
Glucose-Capillary: 73 mg/dL (ref 70–99)
Glucose-Capillary: 109 mg/dL — ABNORMAL HIGH (ref 70–99)
Glucose-Capillary: 66 mg/dL — ABNORMAL LOW (ref 70–99)
Glucose-Capillary: 62 mg/dL — ABNORMAL LOW (ref 70–99)
Glucose-Capillary: 108 mg/dL — ABNORMAL HIGH (ref 70–99)
Glucose-Capillary: 70 mg/dL (ref 70–99)
Glucose-Capillary: 94 mg/dL (ref 70–99)

## 2019-09-02 LAB — BASIC METABOLIC PANEL
Anion gap: 16 — ABNORMAL HIGH (ref 5–15)
BUN: 25 mg/dL — ABNORMAL HIGH (ref 8–23)
CO2: 21 mmol/L — ABNORMAL LOW (ref 22–32)
Calcium: 8.5 mg/dL — ABNORMAL LOW (ref 8.9–10.3)
Chloride: 95 mmol/L — ABNORMAL LOW (ref 98–111)
Creatinine, Ser: 3.62 mg/dL — ABNORMAL HIGH (ref 0.44–1.00)
GFR calc Af Amer: 13 mL/min — ABNORMAL LOW (ref >60)
GFR calc non Af Amer: 11 mL/min — ABNORMAL LOW (ref >60)
Glucose, Bld: 74 mg/dL (ref 70–99)
Potassium: 3.9 mmol/L (ref 3.5–5.1)
Sodium: 132 mmol/L — ABNORMAL LOW (ref 135–145)

## 2019-09-02 LAB — CBC
HCT: 33.2 % — ABNORMAL LOW (ref 36.0–46.0)
Hemoglobin: 10.4 g/dL — ABNORMAL LOW (ref 12.0–15.0)
MCH: 31.6 pg (ref 26.0–34.0)
MCHC: 31.3 g/dL (ref 30.0–36.0)
MCV: 100.9 fL — ABNORMAL HIGH (ref 80.0–100.0)
Platelets: 258 10*3/uL (ref 150–400)
RBC: 3.29 MIL/uL — ABNORMAL LOW (ref 3.87–5.11)
RDW: 19.2 % — ABNORMAL HIGH (ref 11.5–15.5)
WBC: 14.5 10*3/uL — ABNORMAL HIGH (ref 4.0–10.5)
nRBC: 0 % (ref 0.0–0.2)

## 2019-09-02 LAB — MAGNESIUM
Magnesium: 1.7 mg/dL (ref 1.7–2.4)
Magnesium: 1.7 mg/dL (ref 1.7–2.4)

## 2019-09-02 LAB — PROTIME-INR
INR: 2.8 — ABNORMAL HIGH (ref 0.8–1.2)
Prothrombin Time: 29.2 seconds — ABNORMAL HIGH (ref 11.4–15.2)

## 2019-09-02 LAB — PHENOBARBITAL LEVEL: Phenobarbital: 16.2 ug/mL (ref 15.0–30.0)

## 2019-09-02 LAB — PHOSPHORUS
Phosphorus: 2.6 mg/dL (ref 2.5–4.6)
Phosphorus: 1.5 mg/dL — ABNORMAL LOW (ref 2.5–4.6)

## 2019-09-02 LAB — TRIGLYCERIDES: Triglycerides: 772 mg/dL — ABNORMAL HIGH (ref <150)

## 2019-09-02 MED ORDER — MIDAZOLAM 50MG/50ML (1MG/ML) PREMIX INFUSION: 70.0000 mg/h | INTRAVENOUS | Status: DC

## 2019-09-02 MED ORDER — SODIUM CHLORIDE 0.9 % IV SOLN
90.0000 mg/h | INTRAVENOUS | Status: AC
  Administered 2019-09-02: 90 mg/h via INTRAVENOUS
  Filled 2019-09-02: qty 50

## 2019-09-02 MED ORDER — SODIUM CHLORIDE 0.9 % IV SOLN
INTRAVENOUS | Status: DC
  Filled 2019-09-02 (×11): qty 400

## 2019-09-02 MED ORDER — DEXTROSE 50 % IV SOLN
25.0000 mL | Freq: Once | INTRAVENOUS | Status: AC
  Administered 2019-09-02: 25 mL via INTRAVENOUS

## 2019-09-02 MED ORDER — KETAMINE BOLUS VIA INFUSION
1.5000 mg/kg | Freq: Once | INTRAVENOUS | Status: DC
  Filled 2019-09-02: qty 110

## 2019-09-02 MED ORDER — DEXTROSE 50 % IV SOLN
12.5000 g | INTRAVENOUS | Status: AC
  Administered 2019-09-02: 12.5 g via INTRAVENOUS
  Filled 2019-09-02: qty 50

## 2019-09-02 MED ORDER — TOPIRAMATE 100 MG PO TABS
100.0000 mg | ORAL_TABLET | Freq: Every day | ORAL | Status: DC
  Administered 2019-09-02: 100 mg via ORAL
  Filled 2019-09-02: qty 1

## 2019-09-02 MED ORDER — SODIUM CHLORIDE 0.9 % IV SOLN
1.8000 mg/kg/h | INTRAVENOUS | Status: DC
  Administered 2019-09-02: 1 mg/kg/h via INTRAVENOUS
  Administered 2019-09-02 – 2019-09-03 (×3): 1.8 mg/kg/h via INTRAVENOUS
  Filled 2019-09-02 (×6): qty 12.5

## 2019-09-02 MED ORDER — DEXTROSE 50 % IV SOLN
12.5000 g | INTRAVENOUS | Status: AC
  Administered 2019-09-02: 12.5 g via INTRAVENOUS

## 2019-09-02 MED ORDER — SODIUM CHLORIDE 0.9 % IV SOLN
70.0000 mg/h | INTRAVENOUS | Status: DC
  Filled 2019-09-02 (×2): qty 50

## 2019-09-02 MED ORDER — DEXTROSE 50 % IV SOLN
INTRAVENOUS | Status: AC
  Filled 2019-09-02: qty 50

## 2019-09-02 MED ORDER — MIDAZOLAM 50MG/50ML (1MG/ML) PREMIX INFUSION: 90.0000 mg/h | INTRAVENOUS | Status: DC

## 2019-09-02 MED ORDER — KETAMINE BOLUS VIA INFUSION
2.0000 mg/kg | Freq: Once | INTRAVENOUS | Status: AC
  Administered 2019-09-02: 146.2 mg via INTRAVENOUS

## 2019-09-02 MED ORDER — DEXAMETHASONE SODIUM PHOSPHATE 10 MG/ML IJ SOLN
10.0000 mg | Freq: Once | INTRAMUSCULAR | Status: AC
  Administered 2019-09-02: 10 mg via INTRAVENOUS
  Filled 2019-09-02: qty 1

## 2019-09-02 MED ORDER — SODIUM CHLORIDE 0.9 % IV SOLN
80.0000 mg/h | INTRAVENOUS | Status: DC
  Administered 2019-09-02 (×3): 80 mg/h via INTRAVENOUS
  Filled 2019-09-02 (×4): qty 50

## 2019-09-02 MED ORDER — MIDAZOLAM 50MG/50ML (1MG/ML) PREMIX INFUSION: 80.0000 mg/h | INTRAVENOUS | Status: DC

## 2019-09-02 NOTE — Progress Notes (Addendum)
Subjective: Patient continues to be nonconvulsive status epilepticus.  Called and updated patient's son [name].  ROS: Unable to obtain due to poor mental status  Examination  Vital signs in last 24 hours: Temp:  [94.4 F (34.7 C)-98 F (36.7 C)] 96.9 F (36.1 C) (04/07 0800) Pulse Rate:  [57-77] 73 (04/07 1200) Resp:  [8-16] 14 (04/07 1200) BP: (87-155)/(29-60) 138/60 (04/07 1200) SpO2:  [98 %-100 %] 98 % (04/07 1200) FiO2 (%):  [40 %] 40 % (04/07 1134) Weight:  [73.1 kg] 73.1 kg (04/07 0700)  General: lying in bed, NAD CVS: pulse-normal rate and rhythm RS: breathing comfortably, intubated Extremities: normal,  warm   Neuro: MS: comatose, doesn't open eyes to noxious stimuli CN: pupils equal and reacting to light, corneal reflex absent, oculocephalic reflex absent, gag reflex absent Motor: doesn't withdraw to noxious stimuli in all 4 extremities, absent  Basic Metabolic Panel: Recent Labs  Lab 09/20/2019 0147 09/14/2019 0147 09/01/19 0431 09/01/19 0602 09/01/19 0840 09/02/19 0323  NA 134*  --  130* 135 135 132*  K 4.2  --  4.1 4.1 2.9* 3.9  CL 95*  --   --  93* 95* 95*  CO2 28  --   --  22 27 21*  GLUCOSE 113*  --   --  137* 82 74  BUN 34*  --   --  40* 15 25*  CREATININE 5.16*  --   --  6.19* 2.79* 3.62*  CALCIUM 12.4*   < >  --  10.8* 9.2 8.5*  MG  --   --   --  1.9  --  1.7  PHOS  --   --   --  2.1*  --  2.6   < > = values in this interval not displayed.    CBC: Recent Labs  Lab 09/15/2019 0147 09/01/19 0431 09/01/19 0602 09/02/19 0323  WBC 8.2  --  12.7* 14.5*  HGB 11.1* 11.6* 10.9* 10.4*  HCT 36.7 34.0* 35.7* 33.2*  MCV 101.9*  --  100.8* 100.9*  PLT 386  --  485* 258     Coagulation Studies: Recent Labs    09/11/2019 0147 09/01/19 0602 09/02/19 0323  LABPROT 18.0* 21.9* 29.2*  INR 1.5* 1.9* 2.8*    Imaging CT head without contrast 09/02/2019: Hypodensity in the occipital lobes bilaterally right greater than left. 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 Small area of hemorrhage in the right occipital lobe slightly increased from CT yesterday.  ASSESSMENT AND PLAN: 79 year old femalewith posterior reversible encephalopathy syndrome and possible seizure requiring intubation after receiving Ativan.   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 However, again started having subclinical seizures yesterday morning which have been very difficult to control even with high doses of propofol and Versed. -Triglycerides elevated at 772 this morning  Recommendations -We will continue to uptitrate Versed, start ketamine and slowly reduce propofol for management of nonconvulsive status epilepticus. -Will give time dose of dexamethasone 10mg  for status - Continue Keppra 1000mg  BID ( adjusted for renal dysfunction, additional 500mg  after dialysis) and valproic acid 500 mg daily -Strict blood pressure control with goal Systolic BP less than 250 and diastolic NL<97 ,currently patient is hypotensive -Discussed super refractory status epilepticus with patient son [name] again today.  Updated him that this is usually  suggestive of significant irreversible neurologic injury poor neurologic recovery.  [name] states patient would not have wanted to live in a vegetative state.  However he would like to talk to patient's partner Rob and her other son who is in [place] before making any further decisions.  In the interim he would like [place] to continue trying to control status epilepticus with ketamine.  I also discussed with him that the higher dose of the medication that we are using might lead to further hypotension and bradycardia which could eventually lead to  cardiaorespiratory arrest. -As needed IV Versed 5 mg for clinical seizure-like activity -Management of rest of the comorbidities per primary team  ADDENDUM -Spoke with patient's son [name] earlier.  Also spoke with patient's caretaker [name] again this afternoon.  They expressed understanding and would have a family meeting to update care team about their decisions.  In the meanwhile they would like for [place] to continue treating status epilepticus -We will also start patient on topiramate 100MG  daily  CRITICAL CARE Performed by: Lora Havens   Total critical care time: 65 minutes  Critical care time was exclusive of separately billable procedures and treating other patients.  Critical care was necessary to treat or prevent imminent or life-threatening deterioration.  Critical care was time spent personally by me on the following activities: development of treatment plan with patient and/or surrogate as well as nursing, discussions with consultants, evaluation of patient's response to treatment, examination of patient, obtaining history from patient or surrogate, ordering and performing treatments and interventions, ordering and review of laboratory studies, ordering and review of radiographic studies, pulse oximetry and re-evaluation of patient's condition.

## 2019-09-02 NOTE — Progress Notes (Signed)
LTM maint complete -skin breakdown under: F7 lead moved slightly.  Checked Fp1, Fp2  A1  A2 F3  Pz P4 P7

## 2019-09-02 NOTE — Progress Notes (Signed)
ANTICOAGULATION CONSULT NOTE - Follow Up Consult  Pharmacy Consult for warfarin Indication: atrial fibrillation  Allergies  Allergen Reactions  . Lantus [Insulin Glargine] Swelling and Other (See Comments)    "Makes me swell like a balloon all over," including face, but without any respiratory distress or rashes and associated with weight gain  . Statins Other (See Comments)    "I've tried them all; my muscle aches were so bad I couldn't walk".    Patient Measurements: Height: 5\' 5"  (165.1 cm) Weight: 73.1 kg (161 lb 2.5 oz) IBW/kg (Calculated) : 57  Vital Signs: Temp: 96.8 F (36 C) (04/07 0700) Temp Source: Axillary (04/07 0700) BP: 95/47 (04/07 0745) Pulse Rate: 64 (04/07 0745)  Labs: Recent Labs    09/02/2019 0147 09/05/2019 0147 09/11/2019 0348 09/01/19 0431 09/01/19 0431 09/01/19 0602 09/01/19 0840 09/02/19 0323  HGB 11.1*   < >  --  11.6*   < > 10.9*  --  10.4*  HCT 36.7   < >  --  34.0*  --  35.7*  --  33.2*  PLT 386  --   --   --   --  485*  --  258  LABPROT 18.0*  --   --   --   --  21.9*  --  29.2*  INR 1.5*  --   --   --   --  1.9*  --  2.8*  CREATININE 5.16*   < >  --   --   --  6.19* 2.79* 3.62*  TROPONINIHS 63*  --  62*  --   --   --   --   --    < > = values in this interval not displayed.    Estimated Creatinine Clearance: 12.6 mL/min (A) (by C-G formula based on SCr of 3.62 mg/dL (H)).   Medical History: Past Medical History:  Diagnosis Date  . Anemia   . Arthritis   . Blood transfusion without reported diagnosis   . CAD (coronary artery disease)    a. 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  . Cataract   . Chronic anemia   . Chronic combined systolic and diastolic CHF (congestive heart failure) ([place])    a. 2D echo 08/2016 at [facility]: EF 50-55% with inferior wall HK, impaired LV filling, fair study.  . ESRD on hemodialysis ([place])   . Essential hypertension   . Gastritis   . GERD (gastroesophageal reflux disease)   .  Gout   . Headache   . Hyperlipidemia   . Hypothyroidism   . Iron deficiency anemia 11/08/2016  . NSTEMI (non-ST elevated myocardial infarction) ([place])    a. Complex admission 08/2016 - with severe hyperglycemia, AKI on CKD, severe anemia down to Hgb 6.8, acute combined CHF, troponin of 8.5, cath deferred due to renal dysfunction.  . Paroxysmal atrial fibrillation ([place])   . Type 2 diabetes mellitus ([place])   . Wears dentures      Assessment: 79 yo female admitted on 09/24/2019 with altered mental status, rhythmic jerking of L upper extremity and concern of PRES based on CT head. No hemorrhage noted on CT head. Patient is on warfarin prior to admission for Afib. Pharmacy consulted to dose warfarin.    Prior to admission warfarin 4mg  daily per chart notes though appears patient was holding warfarin since 3/31 due to supratherapeutic INR of 5.9. Confirmed with [facility] in [place] that patient was not currently taking warfarin.   Today, INR  2.8 is therapeutic and increasing. INR increased from 1.9 yesterday to 2.8 today. Patient has started valproic acid which can increase INR due to decreased protein binding of warfarin. No reported bleeding. CBC stable.   Goal of Therapy:  INR 2-3 Monitor platelets by anticoagulation protocol: Yes   Plan:  Hold warfarin tonight  Monitor INR, CBC, and S/S of bleeding daily   Cristela Felt, PharmD PGY1 Pharmacy Resident [employer]: [phone]   09/02/2019,7:56 AM

## 2019-09-02 NOTE — Procedures (Signed)
   I was present at this dialysis session, have reviewed the session itself and made  appropriate changes Kelly Splinter MD [place] pager [phone]   09/02/2019, 12:12 PM

## 2019-09-02 NOTE — Procedures (Addendum)
Patient Name:Krystalyn CHELESA WEINGARTNER JPE:[id number] Epilepsy Attending:Niesha Bame Barbra Sarks Referring Physician/Provider:Dr Zeb Comfort Duration:09/01/2019 7225 to 09/02/2019 1305  Patient history:79 year old female noted to have altered mental status. In the ED noted a rhythmic jerking of left upper extremity. CT head concerning for PRES.EEG to evaluate for seizures.  Level of alertness:Comatose  AEDs during EEG study:versed, propofol, LEV, VPA  Technical aspects: This EEG study was done with scalp electrodes positioned according to the 10-20 International system of electrode placement. Electrical activity was acquired at a sampling rate of 500Hz  and reviewed with a high frequency filter of 70Hz  and a low frequency filter of 1Hz . EEG data were recorded continuously and digitally stored.  Description:EEG showed bilateral(right more than left) posterior quadrant polyspikes lasting 2 to 10 seconds alternating with generalized low amplitude 13 to 15 Hz beta activity.Hyperventilation and photic stimulation were not performed.  Abnormality - Nonconvulsive status epilepticus, bilateral (right more than left) posterior quadrant - Background attenuation, generalized  - Excessive beta, generalized  IMPRESSION: This study showed evidence of nonconvulsive status epilepticus arising from bilateral (right more than left) posterior quadrant as well as profound diffuse encephalopathy, nonspecific etiology but likely secondary to sedation.  Aldous Housel Barbra Sarks

## 2019-09-02 NOTE — Progress Notes (Signed)
[facility] Progress Note  Subjective: continues to have nonconvulsive status epilepticus, on propofol/ versed gtt's  Vitals:   09/02/19 0945 09/02/19 1000 09/02/19 1015 09/02/19 1134  BP: (!) 109/51 (!) 118/53 (!) 119/51 (!) 119/55  Pulse: 69 70 71 71  Resp:      Temp:      TempSrc:      SpO2:    100%  Weight:      Height:        Exam: Gen on vent, sedated  No jvd or bruits Chest clear bilat RRR no MRG Abd soft ntnd no mass or ascites +bs MS no joint effusions or deformity Ext 1+ LE dependent edema Neuro is not responsive, sedated on vent RUA AVF+ bruit   Home meds:  - aspirin 81/ warfarin 54m pm  - amiodarone 200 qd  - phoslo 3 ac tid  - prilosec 20/ febuxostat 40 qd  - evolocumab 140 mg sq q2wks  - prn's/ vitamins/ supplements     admit labs > Na 134  K 4.2  Bun 34   Creat 5.1   Ca 10.8/ 12.4   Hb 11    CXR - no active disease   Outpt HD: [name] MWF    4h  300/600   71.5kg   2/2.25 bath  Hep none  RUE AVF       - EPO 8000 qhd    - venofer 50 mg q wk     - no vdra   Assessment/ Plan: 1. AMS - PRES per head CT, also seizures 2. Seizures - non-convulsive status epilepticus, per neurology on versed/ propofol gtt's  3. ESRD - usual HD MWF. HD today.  4. HTN/ vol - not on BP meds at home. BP's were high but now in shock from sedating med for seizures, getting dopamine support. Unable to UF much w/ HD. Up 1-2kg only.  5. Anemia ckd - Hb > 10, no need esa 6. MBD ckd - cont binder, get records 7. VDRF - per primary team. CXR was negative    Rob Vaughan Garfinkle 09/02/2019, 11:38 AM   Recent Labs  Lab 09/01/19 0602 09/01/19 0602 09/01/19 0840 09/02/19 0323  K 4.1   < > 2.9* 3.9  BUN 40*   < > 15 25*  CREATININE 6.19*   < > 2.79* 3.62*  CALCIUM 10.8*   < > 9.2 8.5*  PHOS 2.1*  --   --  2.6  HGB 10.9*  --   --  10.4*   < > = values in this interval not displayed.   Inpatient medications: . atropine  1 mg Intravenous Once  .  chlorhexidine gluconate (MEDLINE KIT)  15 mL Mouth Rinse BID  . Chlorhexidine Gluconate Cloth  6 each Topical Q0600  . Chlorhexidine Gluconate Cloth  6 each Topical Q0600  . feeding supplement (PRO-STAT SUGAR FREE 64)  30 mL Per Tube TID  . insulin aspart  0-9 Units Subcutaneous Q4H  . ketamine  1.5 mg/kg Intravenous Once  . mouth rinse  15 mL Mouth Rinse 10 times per day  . sodium chloride flush  10-40 mL Intracatheter Q12H  . thyroid  120 mg Per Tube QAC breakfast  . valproic acid  500 mg Per Tube TID  . Warfarin - Pharmacist Dosing Inpatient   Does not apply q1600   . DOPamine 20 mcg/kg/min (09/02/19 0800)  . famotidine (PEPCID) IV Stopped (09/01/19 1833)  . feeding supplement (VITAL HIGH PROTEIN) 1,000 mL (  09/01/19 1607)  . ketamine (KETALAR) Adult IV Infusion 1 mg/kg/hr (09/02/19 0858)  . levETIRAcetam 1,000 mg (09/02/19 1135)  . levETIRAcetam    . midazolam 80 mg/hr (09/02/19 0853)  . niCARDipine Stopped (09/22/2019 1105)  . norepinephrine (LEVOPHED) Adult infusion Stopped (09/02/19 0531)  . propofol (DIPRIVAN) infusion 60 mcg/kg/min (09/02/19 1130)   fentaNYL (SUBLIMAZE) injection, fentaNYL (SUBLIMAZE) injection, sodium chloride flush

## 2019-09-02 NOTE — Progress Notes (Signed)
EEG with continuing abnormalities-freq polyspikes, possible brief seizures intermixed with gross slowing.  RECS Increase versed to 80 and propofol to 90 Will need pressors ready should bp drop. PCCM has ordered already. RN aware to titrate as needed.  -- Amie Portland, MD [facility] Pager: [phone] If 7pm to 7am, please call on call as listed on AMION.

## 2019-09-02 NOTE — Progress Notes (Signed)
NAME:  [name], MRN:  [id number], DOB:  11/04/1940, LOS: 2 ADMISSION DATE:  09/21/2019, CONSULTATION DATE: April 5 REFERRING MD: Dr. Tomi Bamberger EDP, CHIEF COMPLAINT: Altered mental status/PRES  Brief History   79 year old female from nursing home with altered mental status of 4/5.  Profoundly hypertensive in ED with CT concerning for PRES.  Intubated due to unresponsiveness transferred to [facility] for admission. She was given Ativan IV for possible seizure of the left upper extremity and shortly after was noted to be unresponsive, for which she was intubated. Started on nicardipine drip and transferred to [facility]  She was recently admitted 3/8-3/19 for AMS presumably secondary to home opioid medications. Also found to have thoracic spine compression fracture t6-t7. Underwent kyphoplasty 3/17.   Past Medical History   has a past medical history of Anemia, Arthritis, Blood transfusion without reported diagnosis, CAD (coronary artery disease), Cataract, Chronic anemia, Chronic combined systolic and diastolic CHF (congestive heart failure) ([place]), ESRD on hemodialysis ([place]), Essential hypertension, Gastritis, GERD (gastroesophageal reflux disease), Gout, Headache, Hyperlipidemia, Hypothyroidism, Iron deficiency anemia (11/08/2016), NSTEMI (non-ST elevated myocardial infarction) ([place]), Paroxysmal atrial fibrillation ([place]), Type 2 diabetes mellitus ([place]), and Wears dentures.   Significant Hospital Events   4/5- Admitted. EEG with status. Neuro consulted.  Given fosphenytoin, phenobarbital, Versed gtt, propofol gtt. started dopamine and nor epi for bradycardia and hypotension  Consults:  Neurology  Procedures:  Intubation 4/5 > Lt IJ 4/5 >  Significant Diagnostic Tests:  CT head 4/5> hypoattenuation within the bilateral occipital white matter is new and may be a manifestation of posterior reversible encephalopathy syndrome.  MRI recommended. EEG 4/7 > continued frequent polyspikes.] MRI brain 4/7  >   Micro Data:    Antimicrobials:     Interim history/subjective:  Continues to have spikes on EEG. Heavily sedated to facilitate burst suppression.  Objective   Blood pressure (!) 97/35, pulse 67, temperature (!) 96.9 F (36.1 C), temperature source Axillary, resp. rate 15, height 5\' 5"  (1.651 m), weight 73.1 kg, SpO2 99 %.    Vent Mode: PRVC FiO2 (%):  [40 %] 40 % Set Rate:  [14 bmp] 14 bmp Vt Set:  [450 mL] 450 mL PEEP:  [5 cmH20] 5 cmH20 Plateau Pressure:  [16 cmH20-17 cmH20] 17 cmH20   Intake/Output Summary (Last 24 hours) at 09/02/2019 0920 Last data filed at 09/02/2019 0700 Gross per 24 hour  Intake 3388.89 ml  Output 1006 ml  Net 2382.89 ml   Filed Weights   09/01/19 0705 09/01/19 1007 09/02/19 0700  Weight: 74.2 kg 73.2 kg 73.1 kg    Examination: General: Adult female in NAD. Neuro: Heavily sedated, does not follow any commands. HEENT: [place]/AT. Sclerae anicteric. ETT in place. Cardiovascular: RRR, no M/R/G.  Lungs: Respirations even and unlabored.  CTA bilaterally, No W/R/R. Abdomen: BS x 4, soft, NT/ND.  Musculoskeletal: No gross deformities, no edema.  Skin: Intact, warm, no rashes.   Assessment & Plan:   Acute encephalopathy secondary to PRES and status epilepticus Continue EEG monitoring Antiepileptics per neurology MRI when stable  PAF: warfarin, subtherapeutic Continue warfarin per tube with pharmacy dosing.  Telemetry monitoring Hold amio due to bradycardia   ESRD on HD: MWF schedule HD per nephrology  DM: CBG monitoring and SSI  Hypertensive crisis - resolved and now off cardene. No further interventions required.  Hypotension - 2/2 heavy sedation for burst suppression. Continue levophed and dopamine PRN.  CAD s/p CABG HFpEF Tele monitoring  Hypothyroid Continue home NP thyroid 120mg   via tube.   Best practice:  Diet: TF's Pain/Anxiety/Delirium protocol (if indicated): Propofol gtt, versed gtt, ketamine gtt VAP protocol (if  indicated): per protocol DVT prophylaxis: warfarin GI prophylaxis: NA Glucose control: SSI Mobility: BR Code Status: FULL Family Communication: Will call Disposition: ICU   Critical care time: 35 min.   Montey Hora, [facility] 09/02/2019, 9:28 AM

## 2019-09-02 NOTE — Progress Notes (Addendum)
EEG with continuing polyspikes intermixed with severe slowing. Question continuing seizure activity off and on.  Will increase Versed to 70/hr Check phenobarb level. Was supratherapeutic after load. Will reassess level and maybe consider adding phenobarb  -- Amie Portland, MD [facility]

## 2019-09-02 NOTE — Progress Notes (Signed)
Patient transported to CT and back without complications. RN at bedside.  

## 2019-09-03 ENCOUNTER — Inpatient Hospital Stay (HOSPITAL_COMMUNITY): Payer: Medicare HMO

## 2019-09-03 DIAGNOSIS — J9600 Acute respiratory failure, unspecified whether with hypoxia or hypercapnia: Secondary | ICD-10-CM

## 2019-09-03 DIAGNOSIS — G40901 Epilepsy, unspecified, not intractable, with status epilepticus: Secondary | ICD-10-CM

## 2019-09-03 LAB — POCT I-STAT 7, (LYTES, BLD GAS, ICA,H+H)
Acid-base deficit: 13 mmol/L — ABNORMAL HIGH (ref 0.0–2.0)
Bicarbonate: 15.9 mmol/L — ABNORMAL LOW (ref 20.0–28.0)
Calcium, Ion: 1.04 mmol/L — ABNORMAL LOW (ref 1.15–1.40)
HCT: 34 % — ABNORMAL LOW (ref 36.0–46.0)
Hemoglobin: 11.6 g/dL — ABNORMAL LOW (ref 12.0–15.0)
O2 Saturation: 43 %
Patient temperature: 98.6
Potassium: 5.9 mmol/L — ABNORMAL HIGH (ref 3.5–5.1)
Sodium: 132 mmol/L — ABNORMAL LOW (ref 135–145)
TCO2: 17 mmol/L — ABNORMAL LOW (ref 22–32)
pCO2 arterial: 51 mmHg — ABNORMAL HIGH (ref 32.0–48.0)
pH, Arterial: 7.103 — CL (ref 7.350–7.450)
pO2, Arterial: 33 mmHg — CL (ref 83.0–108.0)

## 2019-09-03 LAB — CBC
HCT: 30.9 % — ABNORMAL LOW (ref 36.0–46.0)
Hemoglobin: 9.6 g/dL — ABNORMAL LOW (ref 12.0–15.0)
MCH: 32.4 pg (ref 26.0–34.0)
MCHC: 31.1 g/dL (ref 30.0–36.0)
MCV: 104.4 fL — ABNORMAL HIGH (ref 80.0–100.0)
Platelets: 252 10*3/uL (ref 150–400)
RBC: 2.96 MIL/uL — ABNORMAL LOW (ref 3.87–5.11)
RDW: 19.2 % — ABNORMAL HIGH (ref 11.5–15.5)
WBC: 15.3 10*3/uL — ABNORMAL HIGH (ref 4.0–10.5)
nRBC: 0 % (ref 0.0–0.2)

## 2019-09-03 LAB — GLUCOSE, CAPILLARY
Glucose-Capillary: 107 mg/dL — ABNORMAL HIGH (ref 70–99)
Glucose-Capillary: 98 mg/dL (ref 70–99)
Glucose-Capillary: 133 mg/dL — ABNORMAL HIGH (ref 70–99)
Glucose-Capillary: 119 mg/dL — ABNORMAL HIGH (ref 70–99)
Glucose-Capillary: 131 mg/dL — ABNORMAL HIGH (ref 70–99)

## 2019-09-03 LAB — PHOSPHORUS
Phosphorus: 1.1 mg/dL — ABNORMAL LOW (ref 2.5–4.6)
Phosphorus: 3.9 mg/dL (ref 2.5–4.6)

## 2019-09-03 LAB — BASIC METABOLIC PANEL
Anion gap: 14 (ref 5–15)
BUN: 27 mg/dL — ABNORMAL HIGH (ref 8–23)
CO2: 19 mmol/L — ABNORMAL LOW (ref 22–32)
Calcium: 7.5 mg/dL — ABNORMAL LOW (ref 8.9–10.3)
Chloride: 101 mmol/L (ref 98–111)
Creatinine, Ser: 2.33 mg/dL — ABNORMAL HIGH (ref 0.44–1.00)
GFR calc Af Amer: 22 mL/min — ABNORMAL LOW (ref >60)
GFR calc non Af Amer: 19 mL/min — ABNORMAL LOW (ref >60)
Glucose, Bld: 102 mg/dL — ABNORMAL HIGH (ref 70–99)
Potassium: 5.2 mmol/L — ABNORMAL HIGH (ref 3.5–5.1)
Sodium: 134 mmol/L — ABNORMAL LOW (ref 135–145)

## 2019-09-03 LAB — TRIGLYCERIDES: Triglycerides: 1179 mg/dL — ABNORMAL HIGH (ref <150)

## 2019-09-03 LAB — TYPE AND SCREEN
ABO/RH(D): B POS
Antibody Screen: NEGATIVE

## 2019-09-03 LAB — MAGNESIUM
Magnesium: 1.7 mg/dL (ref 1.7–2.4)
Magnesium: 1.5 mg/dL — ABNORMAL LOW (ref 1.7–2.4)

## 2019-09-03 MED ORDER — SODIUM ZIRCONIUM CYCLOSILICATE 5 G PO PACK
5.0000 g | PACK | Freq: Once | ORAL | Status: AC
  Administered 2019-09-03: 5 g via ORAL
  Filled 2019-09-03: qty 1

## 2019-09-03 MED ORDER — SODIUM BICARBONATE 8.4 % IV SOLN: 100.0000 meq | Freq: Once | INTRAVENOUS | Status: AC

## 2019-09-03 MED ORDER — SODIUM BICARBONATE 8.4 % IV SOLN
INTRAVENOUS | Status: AC
  Administered 2019-09-03: 100 meq via INTRAVENOUS
  Filled 2019-09-03: qty 50

## 2019-09-03 MED ORDER — SODIUM CHLORIDE 0.9 % IV SOLN
INTRAVENOUS | Status: DC | PRN
  Administered 2019-09-03: 250 mL via INTRAVENOUS

## 2019-09-03 MED ORDER — ATROPINE SULFATE 1 MG/10ML IJ SOSY
PREFILLED_SYRINGE | INTRAMUSCULAR | Status: AC
  Administered 2019-09-03: 1 mg
  Filled 2019-09-03: qty 10

## 2019-09-03 MED ORDER — TOPIRAMATE 100 MG PO TABS
100.0000 mg | ORAL_TABLET | Freq: Every day | ORAL | Status: DC
  Administered 2019-09-03: 100 mg
  Filled 2019-09-03: qty 1

## 2019-09-03 MED ORDER — EPINEPHRINE HCL 5 MG/250ML IV SOLN IN NS
0.5000 ug/min | INTRAVENOUS | Status: DC
  Filled 2019-09-03: qty 250

## 2019-09-03 MED ORDER — SODIUM POLYSTYRENE SULFONATE 15 GM/60ML PO SUSP: 30.0000 g | Freq: Once | ORAL | Status: DC

## 2019-09-03 MED ORDER — SODIUM PHOSPHATES 45 MMOLE/15ML IV SOLN
20.0000 mmol | Freq: Once | INTRAVENOUS | Status: AC
  Administered 2019-09-03: 20 mmol via INTRAVENOUS
  Filled 2019-09-03: qty 6.67

## 2019-09-03 MED ORDER — EPINEPHRINE 1 MG/10ML IJ SOSY: 1.0000 mg | PREFILLED_SYRINGE | Freq: Once | INTRAMUSCULAR | Status: AC

## 2019-09-03 MED ORDER — SODIUM BICARBONATE-DEXTROSE 150-5 MEQ/L-% IV SOLN
150.0000 meq | INTRAVENOUS | Status: DC
  Administered 2019-09-03: 150 meq via INTRAVENOUS
  Filled 2019-09-03: qty 1000

## 2019-09-03 MED ORDER — EPINEPHRINE HCL 5 MG/250ML IV SOLN IN NS
0.5000 ug/min | INTRAVENOUS | Status: DC
  Administered 2019-09-03: 0.5 ug/min via INTRAVENOUS
  Filled 2019-09-03: qty 250

## 2019-09-03 MED ORDER — ATROPINE SULFATE 1 MG/ML IJ SOLN
1.0000 mg | Freq: Once | INTRAMUSCULAR | Status: AC
  Administered 2019-09-03: 1 mg via INTRAVENOUS

## 2019-09-03 MED ORDER — ISOPROTERENOL HCL 0.2 MG/ML IJ SOLN
2.0000 ug/min | INTRAVENOUS | Status: DC
  Filled 2019-09-03: qty 5

## 2019-09-03 MED ORDER — SODIUM CHLORIDE 0.9% IV SOLUTION: Freq: Once | INTRAVENOUS | Status: DC

## 2019-09-03 MED ORDER — CHLORHEXIDINE GLUCONATE CLOTH 2 % EX PADS: 6.0000 | MEDICATED_PAD | Freq: Every day | CUTANEOUS | Status: DC

## 2019-09-03 MED ORDER — VITAMIN K1 10 MG/ML IJ SOLN
5.0000 mg | Freq: Once | INTRAVENOUS | Status: AC
  Administered 2019-09-03: 5 mg via INTRAVENOUS
  Filled 2019-09-03: qty 0.5

## 2019-09-03 MED ORDER — EPINEPHRINE 1 MG/10ML IJ SOSY
PREFILLED_SYRINGE | INTRAMUSCULAR | Status: AC
  Administered 2019-09-03: 1 mg via INTRAVENOUS
  Filled 2019-09-03: qty 10

## 2019-09-04 LAB — BPAM FFP
Blood Product Expiration Date: 202104132359
Blood Product Expiration Date: 202104132359
ISSUE DATE / TIME: 202104081305
ISSUE DATE / TIME: 202104081305
Unit Type and Rh: 7300
Unit Type and Rh: 7300

## 2019-09-04 LAB — PROTIME-INR
INR: 6.4 (ref 0.8–1.2)
Prothrombin Time: 56.3 seconds — ABNORMAL HIGH (ref 11.4–15.2)

## 2019-09-04 LAB — PREPARE FRESH FROZEN PLASMA

## 2019-09-26 NOTE — Procedures (Signed)
Patient Name:[name] ZHG:[id number] Epilepsy Attending:Damali Broadfoot Barbra Sarks Referring Physician/Provider:Dr Zeb Comfort Duration:09/02/2019 [phone] to September 13, 2019 2297 Total duration of study: 51 hours  Patient history:79 year old female noted to have altered mental status. In the ED noted a rhythmic jerking of left upper extremity. CT head concerning for PRES.EEG to evaluate for seizures.  Level of alertness:Comatose  AEDs during EEG study:versed, propofol, LEV, VPA, ketamine, dexamethasone, topiramate  Technical aspects: This EEG study was done with scalp electrodes positioned according to the 10-20 International system of electrode placement. Electrical activity was acquired at a sampling rate of 500Hz  and reviewed with a high frequency filter of 70Hz  and a low frequency filter of 1Hz . EEG data were recorded continuously and digitally stored.  Description:EEGshowed bilateral(right more than left) posterior quadrant polyspikes lasting 2 to 10 seconds. After around 2200 on 09/02/2019, EEG showed generalized background attenuation with intermittent bilateral(right more than left) posterior quadrant polyspikes.After 8am on 2019/09/13, again eeg showed bilateral(right more than left) posterior quadrant polyspikes lasting 2 to 10 seconds. Hyperventilation and photic stimulation were not performed.  Abnormality - Nonconvulsive status epilepticus, bilateral (right more than left) posterior quadrant - Polyspikes, bilateral posterior quadrant ( right more than left) - Background attenuation, generalized  IMPRESSION: This study initially showed evidence of nonconvulsive status epilepticus arising from bilateral (right more than left) posterior quadrant. EEG improved after 2200 on 09/02/2019 and showed profound diffuse encephalopathy, nonspecific etiology. Again after 8am on September 13, 2019, seizures recurred.  [name] Barbra Sarks

## 2019-09-26 NOTE — Progress Notes (Signed)
eLink Physician-Brief Progress Note Patient Name: [name] DOB: 1941-04-03 MRN: [id number]   Date of Service  10-Sep-2019  HPI/Events of Note  Pt with agonal heart rhythm, she is a DNR but her son was hoping to arrive tomorrow evening from [place] prior to withdrawing care.  eICU Interventions  Patient received 2 doses of Atropine 1 mg, Epinephrine 100 mcg followed by an Epinephrine infusion after she showed a positive response to the Epinephrine, 2 amps of sodium bicarbonate for a PH of 7.1 that was felt to be contributing to the depressed heart function, transcutaneous pacing was attempted but failed to capture, the son was allowed to camera into the room to "visit" with his mom while resuscitative efforts were ongoing, despite aggressive efforts at resuscitation patient ultimately expired, she did not receive CPR in line with her DNR status.        Kerry Kass [name] September 10, 2019, 10:59 PM

## 2019-09-26 NOTE — Progress Notes (Signed)
At 2127, pt asystole on monitor. Auscultated lung and heart sounds with Neila Gear, RN who confirmed time of death. Notified family Liliane Channel) and friend [name]). Post-Mortem checklist complete. CDC called, no donation warranted.   CDS Referral: [id number]

## 2019-09-26 NOTE — Discharge Summary (Signed)
   NAME:  [name], MRN:  [id number], DOB:  02-12-1941, LOS: 3 ADMISSION DATE:  09/07/2019, CONSULTATION DATE: April 5 REFERRING MD: Dr. Tomi Bamberger EDP, CHIEF COMPLAINT: Altered mental status/PRES  Brief History   79 year old female from nursing home with altered mental status of 4/5.  Profoundly hypertensive in ED with CT concerning for PRES.  Intubated due to unresponsiveness transferred to [facility] for admission. She was given Ativan IV for possible seizure of the left upper extremity and shortly after was noted to be unresponsive, for which she was intubated. Started on nicardipine drip and transferred to [facility]  She was recently admitted 3/8-3/19 for AMS presumably secondary to home opioid medications. Also found to have thoracic spine compression fracture t6-t7. Underwent kyphoplasty 3/17.   Past Medical History   has a past medical history of Anemia, Arthritis, Blood transfusion without reported diagnosis, CAD (coronary artery disease), Cataract, Chronic anemia, Chronic combined systolic and diastolic CHF (congestive heart failure) ([place]), ESRD on hemodialysis ([place]), Essential hypertension, Gastritis, GERD (gastroesophageal reflux disease), Gout, Headache, Hyperlipidemia, Hypothyroidism, Iron deficiency anemia (11/08/2016), NSTEMI (non-ST elevated myocardial infarction) ([place]), Paroxysmal atrial fibrillation ([place]), Type 2 diabetes mellitus ([place]), and Wears dentures.   Significant Hospital Events   4/5- Admitted. EEG with status. Neuro consulted.  Given fosphenytoin, phenobarbital, Versed gtt, propofol gtt. started dopamine and nor epi for bradycardia and hypotension  Consults:  Neurology  Procedures:  Intubation 4/5 > Lt IJ 4/5 >  Significant Diagnostic Tests:  CT head 4/5> hypoattenuation within the bilateral occipital white matter is new and may be a manifestation of posterior reversible encephalopathy syndrome.  MRI recommended. EEG 4/7 > continued frequent polyspikes, evidence of  NCSE. MRI brain 4/7 >  CT head 4/8 >   Micro Data:    Antimicrobials:     Assessment & Plan:   Acute encephalopathy secondary to PRES and status epilepticus Continue EEG monitoring Antiepileptics per neurology, poor prognosis MRI when stable  PAF (on warfarin - has been on hold due to supratherapeutic INR) Hold warfarin (pharmacy dosing / managing) Hold on reversal agent until after STAT head CT Follow coags Telemetry monitoring Hold amio due to bradycardia   ESRD on HD: MWF schedule. Mild hyperkalemia - s/p lokelma. Hypophosphatemia. HD per nephrology 20 mmol NaPhos   DM. CBG monitoring and SSI  Hypertensive crisis - resolved and now off cardene. No further interventions required  Hypotension - 2/2 heavy sedation for burst suppression. Continue levophed and dopamine PRN  CAD s/p CABG. HFpEF. Tele monitoring   COURSE - 4/8 underwent emergent head CT due to absent cranial nerve reflexes which worsening stable tiny occipital hemorrhage on right and mild cerebral edema  EEG showed recurrence of seizures in spite of high-dose Versed and ketamine and other AEDs.  Poor prognosis for meaningful neurological recovery given to family Confirmed DNR status from son Liliane Channel, orders issued.  She passed away later that night after bradycardic event  Cause of death -status epilepticus , comorbidities-ESRD on dialysis, paroxysmal atrial fibrillation, CAD, diabetes type 2  Shandria Clinch V. Elsworth Soho MD

## 2019-09-26 NOTE — Progress Notes (Addendum)
Subjective: Continues to be in status epilepticus which improved last night but seizures resumed this morning.  ROS: Unable to obtain due to poor mental status  Examination  Vital signs in last 24 hours: Temp:  [97.5 F (36.4 C)-101.8 F (38.8 C)] 97.8 F (36.6 C) (04/08 0800) Pulse Rate:  [56-108] 59 (04/08 0945) Resp:  [14-31] 20 (04/08 0945) BP: (90-170)/(41-65) 128/51 (04/08 0945) SpO2:  [94 %-100 %] 94 % (04/08 0945) FiO2 (%):  [40 %] 40 % (04/08 0747)  General: lying in bed, NAD CVS: pulse-normal rate and rhythm RS: breathing comfortably, intubated Extremities: normal, warm  Neuro: YT:WKMQKMMN, doesn't open eyes to noxious stimuli CN: pupilsabout 2 to 3 mm and difficult to appreciate any reactivity,corneal reflex absent, oculocephalic reflex absent, gag reflex absent Motor:doesn't withdraw to noxious stimuliin all 4 extremities, absent  Basic Metabolic Panel: Recent Labs  Lab 08/30/2019 0147 08/28/2019 0147 09/01/19 0431 09/01/19 0602 09/01/19 0602 09/01/19 0840 09/02/19 0323 09/02/19 1634 09/24/2019 0437  NA 134*   < > 130* 135  --  135 132*  --  134*  K 4.2   < > 4.1 4.1  --  2.9* 3.9  --  5.2*  CL 95*  --   --  93*  --  95* 95*  --  101  CO2 28  --   --  22  --  27 21*  --  19*  GLUCOSE 113*  --   --  137*  --  82 74  --  102*  BUN 34*  --   --  40*  --  15 25*  --  27*  CREATININE 5.16*  --   --  6.19*  --  2.79* 3.62*  --  2.33*  CALCIUM 12.4*   < >  --  10.8*   < > 9.2 8.5*  --  7.5*  MG  --   --   --  1.9  --   --  1.7 1.7 1.7  PHOS  --   --   --  2.1*  --   --  2.6 1.5* 1.1*   < > = values in this interval not displayed.    CBC: Recent Labs  Lab 09/20/2019 0147 09/01/19 0431 09/01/19 0602 09/02/19 0323 09/24/2019 0437  WBC 8.2  --  12.7* 14.5* 15.3*  HGB 11.1* 11.6* 10.9* 10.4* 9.6*  HCT 36.7 34.0* 35.7* 33.2* 30.9*  MCV 101.9*  --  100.8* 100.9* 104.4*  PLT 386  --  485* 258 252     Coagulation Studies: Recent Labs    09/01/19 0602  09/02/19 0323 September 24, 2019 0437  LABPROT 21.9* 29.2* 56.3*  INR 1.9* 2.8* 6.4*    Imaging CT head without contrast 09/24/2019: Stable symmetric occipital edema with minimal right-sided subcortical hemorrhage.   ASSESSMENT AND PLAN: 79 year old femalewith posterior reversible encephalopathy syndrome and possible seizure requiring intubation after receiving Ativan.  Super Refractory New onsetNon-convulsive status epilepticus  PRES Acute toxic-metabolic encephalopathy Acute respiratory failure End-stage renal disease on dialysis Hypertriglyceridemia Cerebral edema Right occipital lobe hemorrhage - LTM eeg improved initially after propofol and Versed however seizures resumed after few hours.  Again on 09/02/2019 seizures stopped briefly overnight but resumed again this morning. -Triglycerides elevated at 1179 this morning - This morning patient does not have any cranial nerve reflexes (pupils possibly sluggishly reacting to light but very difficult to appreciate), she does not initiate breath and does not withdraw to noxious stimuli.  Her seizures briefly ceased overnight without significant  change in AED regimen.  Patient has elevated INR and had small area of hemorrhage in the right occipital lobe. Repeated CT head to look for worsening edema or hemorrhage which was stable.  Recommendations - Discontinue propofol due to hypertriglyceridemia - Continue Versed and ketamine at current dose. Will likely DC if family would like to transition to comfort care  - Continue Keppra 1000mg  BID ( adjusted for renal dysfunction, additional 500mg  after dialysis) and valproic acid 500 mg daily, topiramate 100MG  daily -Strict blood pressure control with goalSystolicBP less than 191YOM diastolic AY<04 ,currently patient is hypotensive -Patient has had super refractory status epilepticus for over 72 hours at this point and we have been unable to control it on high-dose Versed, propofol (had to be  discontinued today due to hypertriglyceridemia), ketamine, Keppra, valproic acid, topiramate, dexamethasone.  At this point, I am concerned that given patient's age, comorbidities and super refractory status epilepticus, patient has likely suffered irreversible neurologic damage with poor neurologic recovery.  I have updated patient's family including son Liliane Channel and Comptroller on 09/02/2019.  Spoke again with caretaker/partner [name] today and he reported that after family discussion they are thinking of transitioning patient to DNR status and possibly comfort care by this weekend because they understand that at this point even if patient recovers her quality of life will not be what she would have wanted.  I also left voicemail for patient's son Liliane Channel to confirm the change in goals of care to DNR status.  Will notify ICU team once son Liliane Channel who will be the power of attorney confirms change in [place].   CRITICAL CARE Performed by: Lora Havens   Total critical care time:24minutes  Critical care time was exclusive of separately billable procedures and treating other patients.  Critical care was necessary to treat or prevent imminent or life-threatening deterioration.  Critical care was time spent personally by me on the following activities: development of treatment plan with patient and/or surrogate as well as nursing, discussions with consultants, evaluation of patient's response to treatment, examination of patient, obtaining history from patient or surrogate, ordering and performing treatments and interventions, ordering and review of laboratory studies, ordering and review of radiographic studies, pulse oximetry and re-evaluation of patient's condition.

## 2019-09-26 NOTE — Progress Notes (Signed)
Confirmed DNR status from son Liliane Channel, orders issued. He desires to continue mechanical ventilation, dialysis and pressors until he can get in from [place] tomorrow evening and would then decide on further course  Sofie Schendel V. Elsworth Soho MD

## 2019-09-26 NOTE — Progress Notes (Signed)
eLink Physician-Brief Progress Note Patient Name: SHIRLENA BRINEGAR DOB: [birth year] MRN: [id number]   Date of Service  2019-09-19  HPI/Events of Note  Coagulopathy - INR = 6.4.   eICU Interventions  Will order: 1. Continue to hold Warfarin.  2. Transfuse 2 units FFP. 3. Repeat PT/INR at 10 AM.        Lysle Dingwall Sep 19, 2019, 5:59 AM

## 2019-09-26 NOTE — Progress Notes (Signed)
Pt began sustaining a HR in the 30"s at 2000. Atropine X3 given, Dopamine maxed out. Notified Dr. Lucile Shutters through [place] pacer pads and began pacing. Orders for Epi gtt and bicarb at this time. Family present through [place]. Will continue to monitor.

## 2019-09-26 NOTE — Progress Notes (Signed)
NAME:  [name], MRN:  [id number], DOB:  01/08/1941, LOS: 3 ADMISSION DATE:  08/29/2019, CONSULTATION DATE: April 5 REFERRING MD: Dr. Tomi Bamberger EDP, CHIEF COMPLAINT: Altered mental status/PRES  Brief History   79 year old female from nursing home with altered mental status of 4/5.  Profoundly hypertensive in ED with CT concerning for PRES.  Intubated due to unresponsiveness transferred to [facility] for admission. She was given Ativan IV for possible seizure of the left upper extremity and shortly after was noted to be unresponsive, for which she was intubated. Started on nicardipine drip and transferred to [facility]  She was recently admitted 3/8-3/19 for AMS presumably secondary to home opioid medications. Also found to have thoracic spine compression fracture t6-t7. Underwent kyphoplasty 3/17.   Past Medical History   has a past medical history of Anemia, Arthritis, Blood transfusion without reported diagnosis, CAD (coronary artery disease), Cataract, Chronic anemia, Chronic combined systolic and diastolic CHF (congestive heart failure) ([place]), ESRD on hemodialysis ([place]), Essential hypertension, Gastritis, GERD (gastroesophageal reflux disease), Gout, Headache, Hyperlipidemia, Hypothyroidism, Iron deficiency anemia (11/08/2016), NSTEMI (non-ST elevated myocardial infarction) ([place]), Paroxysmal atrial fibrillation ([place]), Type 2 diabetes mellitus ([place]), and Wears dentures.   Significant Hospital Events   4/5- Admitted. EEG with status. Neuro consulted.  Given fosphenytoin, phenobarbital, Versed gtt, propofol gtt. started dopamine and nor epi for bradycardia and hypotension  Consults:  Neurology  Procedures:  Intubation 4/5 > Lt IJ 4/5 >  Significant Diagnostic Tests:  CT head 4/5> hypoattenuation within the bilateral occipital white matter is new and may be a manifestation of posterior reversible encephalopathy syndrome.  MRI recommended. EEG 4/7 > continued frequent polyspikes, evidence of  NCSE. MRI brain 4/7 >  CT head 4/8 >   Micro Data:    Antimicrobials:     Interim history/subjective:  Ongoing NCSE.  On ketamine and versed (prop stopped due to hypertriglyceridemia). EEG flat, concern for ICH?  STAT CT head pending.  Objective   Blood pressure (!) 133/49, pulse 65, temperature (!) 101.6 F (38.7 C), temperature source Axillary, resp. rate (!) 22, height 5\' 5"  (1.651 m), weight 73.1 kg, SpO2 95 %.    Vent Mode: PRVC FiO2 (%):  [40 %] 40 % Set Rate:  [14 bmp] 14 bmp Vt Set:  [450 mL] 450 mL PEEP:  [5 cmH20] 5 cmH20 Plateau Pressure:  [17 cmH20-20 cmH20] 20 cmH20   Intake/Output Summary (Last 24 hours) at September 11, 2019 0833 Last data filed at 09-11-19 0700 Gross per 24 hour  Intake 4874.76 ml  Output --  Net 4874.76 ml   Filed Weights   09/01/19 0705 09/01/19 1007 09/02/19 0700  Weight: 74.2 kg 73.2 kg 73.1 kg    Examination: General: Adult female in NAD. Neuro: Heavily sedated, does not follow any commands. HEENT: [place]/AT. Sclerae anicteric. ETT in place. Cardiovascular: RRR, no M/R/G.  Lungs: Respirations even and unlabored.  CTA bilaterally, No W/R/R. Abdomen: BS x 4, soft, NT/ND.  Musculoskeletal: No gross deformities, no edema.  Skin: Intact, warm, no rashes.   Assessment & Plan:   Acute encephalopathy secondary to PRES and status epilepticus Continue EEG monitoring Antiepileptics per neurology, poor prognosis Family discussing things today, will notify us of their decisions and any questions they might have regarding plan going forward / goals of care MRI when stable  PAF (on warfarin - has been on hold due to supratherapeutic INR) Hold warfarin (pharmacy dosing / managing) Hold on reversal agent until after STAT head CT Follow coags Telemetry monitoring  Hold amio due to bradycardia   ESRD on HD: MWF schedule. Mild hyperkalemia - s/p lokelma. Hypophosphatemia. HD per nephrology 20 mmol NaPhos Follow BMP  DM. CBG monitoring and  SSI  Hypertensive crisis - resolved and now off cardene. No further interventions required  Hypotension - 2/2 heavy sedation for burst suppression. Continue levophed and dopamine PRN  CAD s/p CABG. HFpEF. Tele monitoring  Hypothyroid. Continue home NP thyroid 120mg  via tube   Best practice:  Diet: TF's Pain/Anxiety/Delirium protocol (if indicated): Propofol gtt, versed gtt, ketamine gtt VAP protocol (if indicated): per protocol DVT prophylaxis: warfarin GI prophylaxis: NA Glucose control: SSI Mobility: BR Code Status: FULL Family Communication: Will call Disposition: ICU   Critical care time: 35 min.   Montey Hora, [facility] 09/13/19, 8:33 AM

## 2019-09-26 NOTE — Progress Notes (Signed)
[facility] Progress Note  Subjective: worsening neuro exam per neuro notes.   Vitals:   09-24-2019 1430 09/24/19 1435 09/24/19 1445 09/24/19 1500  BP: (!) 67/40 (!) 80/40 (!) 118/48 (!) 135/35  Pulse: 75 75 93 92  Resp: (!) '23 18 18 19  ' Temp:      TempSrc:      SpO2: 93% 93% 93% 93%  Weight:      Height:        Exam: Gen on vent, sedated  No jvd or bruits Chest clear bilat RRR no MRG Abd soft ntnd no mass or ascites +bs MS no joint effusions or deformity Ext 1+ LE dependent edema Neuro is not responsive, sedated on vent RUA AVF+ bruit   Home meds:  - aspirin 81/ warfarin 34m pm  - amiodarone 200 qd  - phoslo 3 ac tid  - prilosec 20/ febuxostat 40 qd  - evolocumab 140 mg sq q2wks  - prn's/ vitamins/ supplements     admit labs > Na 134  K 4.2  Bun 34   Creat 5.1   Ca 10.8/ 12.4   Hb 11    CXR - no active disease   Outpt HD: [name] MWF    4h  300/600   71.5kg   2/2.25 bath  Hep none  RUE AVF       - EPO 8000 qhd    - venofer 50 mg q wk     - no vdra   Assessment/ Plan: 1. AMS - PRES per head CT, also seizures 2. Seizures - non-convulsive status epilepticus, per neurology on versed/ propofol gtt's. Worsening neuro exam per neuro notes.  3. ESRD - usual HD MWF. HD tomorrow.  4. HTN/ vol - not on BP meds at home. Up 1-2kg, no sig vol excess on exam 5. Anemia ckd - Hb > 10, no need esa 6. MBD ckd - cont binder, get records 7. VDRF - per primary team. CXR was negative    [name] 42021-04-29 3:09 PM   Recent Labs  Lab 09/02/19 0323 09/02/19 0323 09/02/19 1634 [id number]  K 3.9  --   --  5.2*  BUN 25*  --   --  27*  CREATININE 3.62*  --   --  2.33*  CALCIUM 8.5*  --   --  7.5*  PHOS 2.6   < > 1.5* 1.1*  HGB 10.4*  --   --  9.6*   < > = values in this interval not displayed.   Inpatient medications: . sodium chloride   Intravenous Once  . atropine  1 mg Intravenous Once  . chlorhexidine gluconate (MEDLINE KIT)  15 mL  Mouth Rinse BID  . Chlorhexidine Gluconate Cloth  6 each Topical Q0600  . Chlorhexidine Gluconate Cloth  6 each Topical Q0600  . feeding supplement (PRO-STAT SUGAR FREE 64)  30 mL Per Tube TID  . insulin aspart  0-9 Units Subcutaneous Q4H  . ketamine  1.5 mg/kg Intravenous Once  . mouth rinse  15 mL Mouth Rinse 10 times per day  . sodium chloride flush  10-40 mL Intracatheter Q12H  . thyroid  120 mg Per Tube QAC breakfast  . topiramate  100 mg Per Tube Daily  . valproic acid  500 mg Per Tube TID  . Warfarin - Pharmacist Dosing Inpatient   Does not apply q1600   . sodium chloride Stopped (004-29-211100)  . DOPamine 10 mcg/kg/min (004/29/20211500)  . famotidine (  PEPCID) IV Stopped (09/02/19 1904)  . feeding supplement (VITAL HIGH PROTEIN) 30 mL/hr at 2019-09-05 1500  . ketamine (KETALAR) Adult IV Infusion 1.8 mg/kg/hr (09-05-2019 1500)  . levETIRAcetam Stopped (09-05-2019 1046)  . levETIRAcetam Stopped (09/02/19 1528)  . midazolam (VERSED) 500 mg in sodium chloride 0.9% 500 mL infusion 90 mL/hr at Sep 05, 2019 1500  . norepinephrine (LEVOPHED) Adult infusion 7 mcg/min (09/05/19 1500)  . sodium phosphate  Dextrose 5% IVPB 43 mL/hr at 09-05-19 1500   sodium chloride, fentaNYL (SUBLIMAZE) injection, fentaNYL (SUBLIMAZE) injection, sodium chloride flush

## 2019-09-26 NOTE — Progress Notes (Signed)
Notified CCM of critical values PT 56.3 and INR 6.4. New orders to transfuse 2 units of FFP. Also, CCM placed a new order for kayexalate for potassium of 5.2.   Notified Nephrology of potassium increase since patient was just dialyzed 4/7 and 4/6. Verbal order from nephrology to d/c kayexalate and order lokelma 5G instead.

## 2019-09-26 NOTE — Progress Notes (Signed)
Patient transported to CT and back without complications. RN at bedside.  

## 2019-09-26 NOTE — Progress Notes (Addendum)
Advised by CCM (at [place]) to hold off on giving FFP pending CT head and family conversation.

## 2019-09-26 NOTE — Progress Notes (Addendum)
ANTICOAGULATION CONSULT NOTE - Follow Up Consult  Pharmacy Consult for warfarin Indication: atrial fibrillation  Allergies  Allergen Reactions  . Lantus [Insulin Glargine] Swelling and Other (See Comments)    "Makes me swell like a balloon all over," including face, but without any respiratory distress or rashes and associated with weight gain  . Statins Other (See Comments)    "I've tried them all; my muscle aches were so bad I couldn't walk".    Patient Measurements: Height: 5\' 5"  (165.1 cm) Weight: 73.1 kg (161 lb 2.5 oz) IBW/kg (Calculated) : 57  Vital Signs: Temp: 97.8 F (36.6 C) (04/08 0800) Temp Source: Axillary (04/08 0441) BP: 122/49 (04/08 0830) Pulse Rate: 62 (04/08 0830)  Labs: Recent Labs    09/01/19 0602 09/01/19 0602 09/01/19 0840 09/02/19 0323 2019-09-06 0437  HGB 10.9*   < >  --  10.4* 9.6*  HCT 35.7*  --   --  33.2* 30.9*  PLT 485*  --   --  258 252  LABPROT 21.9*  --   --  29.2* 56.3*  INR 1.9*  --   --  2.8* 6.4*  CREATININE 6.19*   < > 2.79* 3.62* 2.33*   < > = values in this interval not displayed.    Estimated Creatinine Clearance: 19.6 mL/min (A) (by C-G formula based on SCr of 2.33 mg/dL (H)).   Medical History: Past Medical History:  Diagnosis Date  . Anemia   . Arthritis   . Blood transfusion without reported diagnosis   . CAD (coronary artery disease)    a. 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  . Cataract   . Chronic anemia   . Chronic combined systolic and diastolic CHF (congestive heart failure) ([place])    a. 2D echo 08/2016 at [facility]: EF 50-55% with inferior wall HK, impaired LV filling, fair study.  . ESRD on hemodialysis ([place])   . Essential hypertension   . Gastritis   . GERD (gastroesophageal reflux disease)   . Gout   . Headache   . Hyperlipidemia   . Hypothyroidism   . Iron deficiency anemia 11/08/2016  . NSTEMI (non-ST elevated myocardial infarction) ([place])    a. Complex admission 08/2016 -  with severe hyperglycemia, AKI on CKD, severe anemia down to Hgb 6.8, acute combined CHF, troponin of 8.5, cath deferred due to renal dysfunction.  . Paroxysmal atrial fibrillation ([place])   . Type 2 diabetes mellitus ([place])   . Wears dentures      Assessment: 79 yo female admitted on 09/06/2019 with altered mental status, rhythmic jerking of L upper extremity and concern of PRES based on CT head. No hemorrhage noted on CT head. Patient is on warfarin prior to admission for Afib. Pharmacy consulted to dose warfarin.    Prior to admission warfarin 4mg  daily per chart notes though appears patient was holding warfarin since 3/31 due to supratherapeutic INR of 5.9. Confirmed with [facility] in [place] that patient was not currently taking warfarin.   Today, INR 6.4 is supratherapeutic. Warfarin was held yesterday. Patient has started valproic acid which can increase INR due to decreased protein binding of warfarin. CBC stable. No current bleeding and patient intended to receive 2 units FFP but spoke with Dr. Hortense Ramal and CCM and getting a head CT due to EEG changes and will hold giving FFP and follow up with CCM on potential reversal after CT head.   Goal of Therapy:  INR 2-3 Monitor platelets by anticoagulation  protocol: Yes   Plan:  Hold warfarin tonight  Follow up results of CT head  Monitor INR, CBC, and S/S of bleeding daily   Cristela Felt, PharmD PGY1 Pharmacy Resident [employer]: [phone]   2019-09-26,9:05 AM  Addendum: Damaris Schooner with CCM and subcortical hemorrhage present on CT head but stable. No indication to reverse warfarin. Continue with plan to hold warfarin tonight.

## 2019-09-26 NOTE — Progress Notes (Signed)
Notified Neurology MD about EEG waves. Previously showing seizure like activity, now all waves are flat. No changes at this time in burst suppression medications. Patient still on propofol, versed and ketamine continuous ggts. Will continue to monitor.

## 2019-09-26 NOTE — Progress Notes (Signed)
LTM EEG discontinued - no skin breakdown at unhook.   

## 2019-09-26 DEATH — deceased

## 2019-11-05 ENCOUNTER — Ambulatory Visit: Payer: Medicare HMO | Admitting: Internal Medicine

## 2019-12-01 ENCOUNTER — Ambulatory Visit (INDEPENDENT_AMBULATORY_CARE_PROVIDER_SITE_OTHER): Payer: Medicare HMO | Admitting: Internal Medicine

## 2020-08-31 IMAGING — CT CT CHEST WITHOUT CONTRAST
2 of 3 series · 15 of 36 positions shown, 18 images · non-contrast
Comparison: CT chest 09/17/2016

CLINICAL DATA: Evaluate chest wall pain

EXAM:
CT CHEST WITHOUT CONTRAST
TECHNIQUE: Multidetector CT imaging of the chest was performed following the
standard protocol without IV contrast.

[Series 3: thorax · axial · 0.70mm/px · z∈[+792,+1014]mm · 12 of 131 slices shown, 15 images]
[im 10/131  mediastinal]
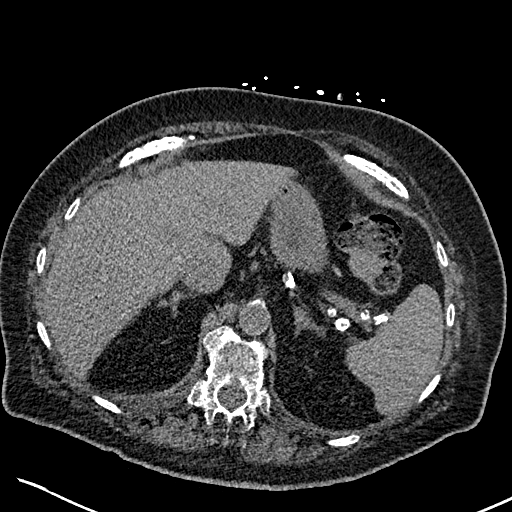
[im 10/131  lung]
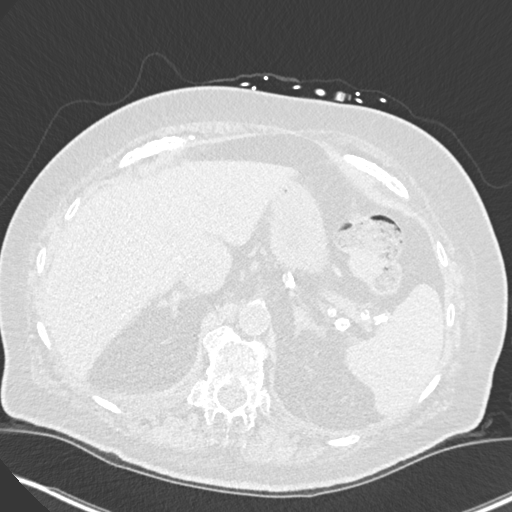
[im 20/131  lung]
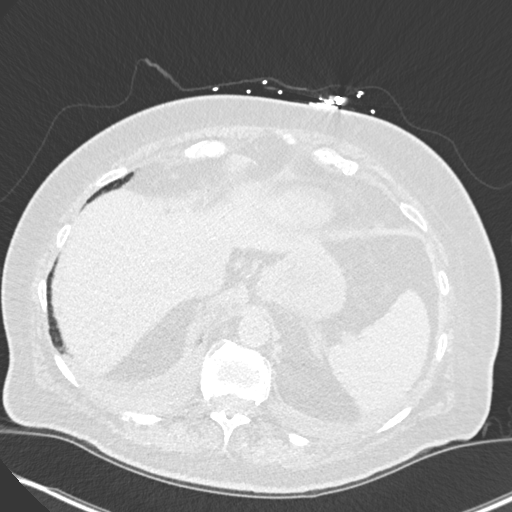
[im 29/131  lung]
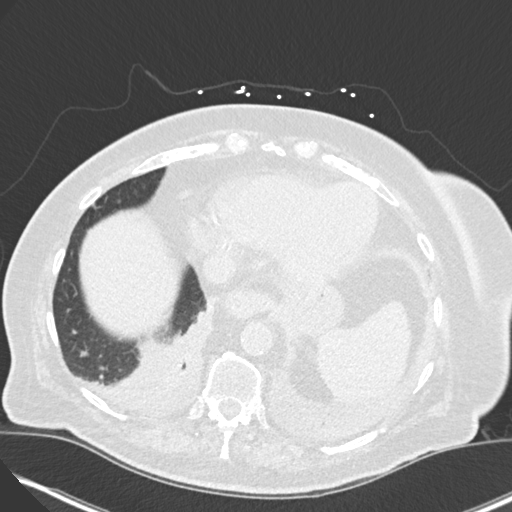
[im 39/131  lung]
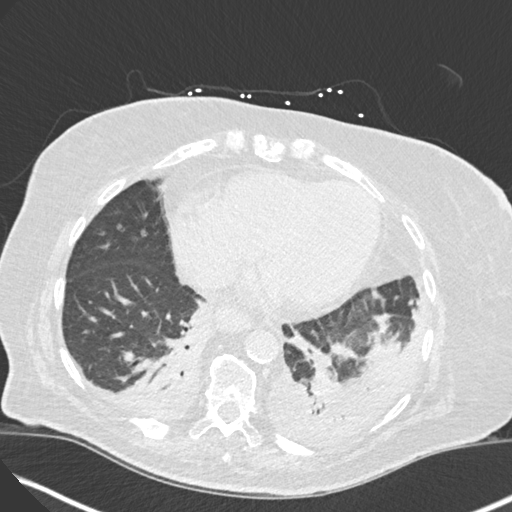
[im 49/131  mediastinal]
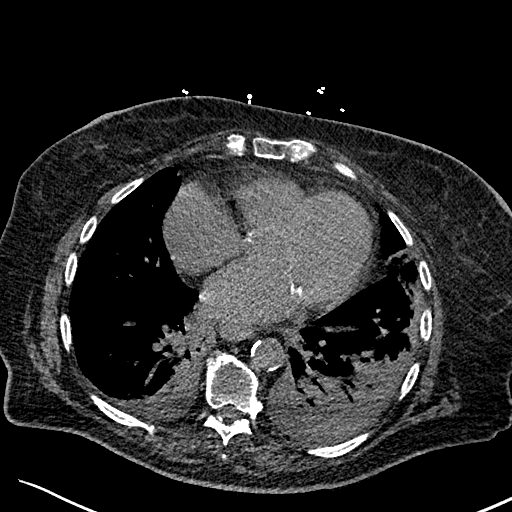
[im 49/131  lung]
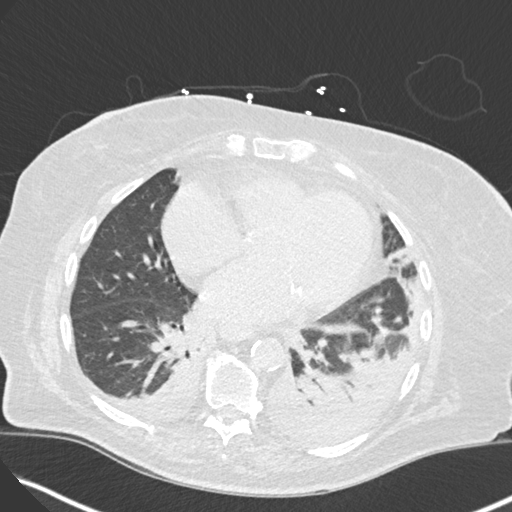
[im 58/131  lung]
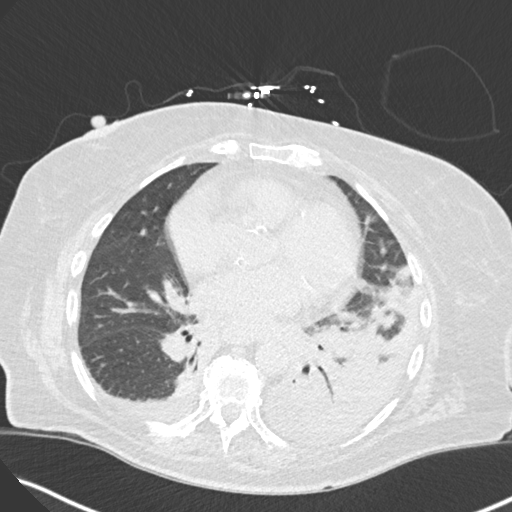
[im 73/131  lung]
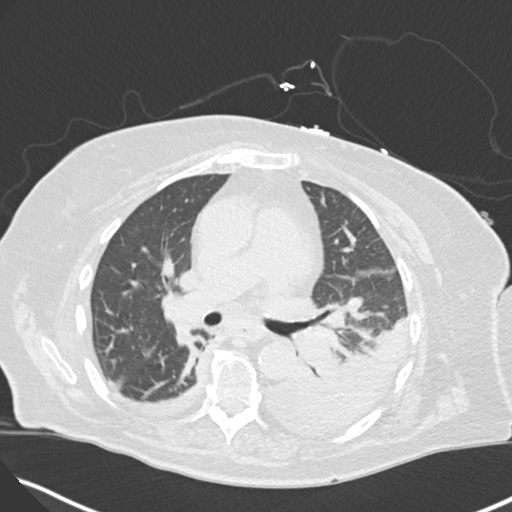
[im 82/131  lung]
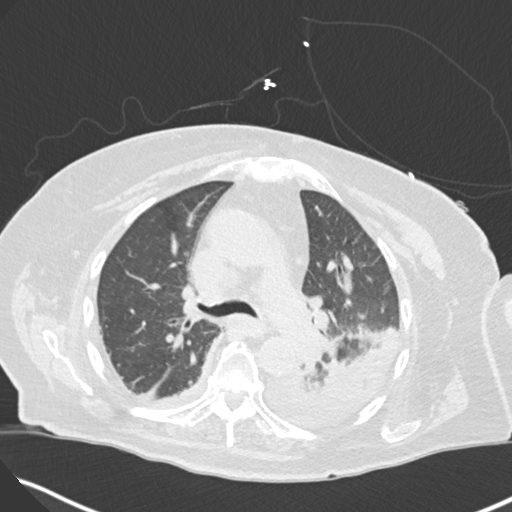
[im 92/131  mediastinal]
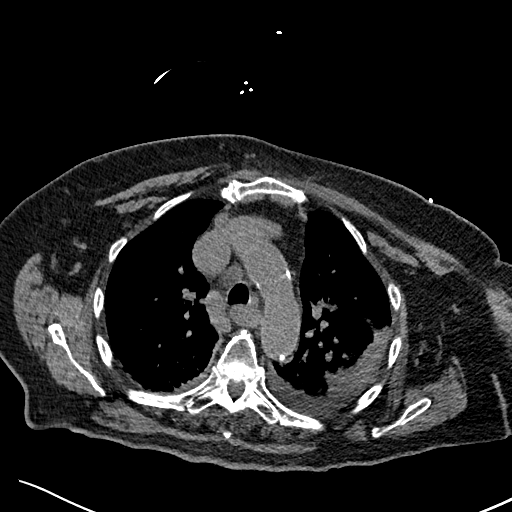
[im 92/131  lung]
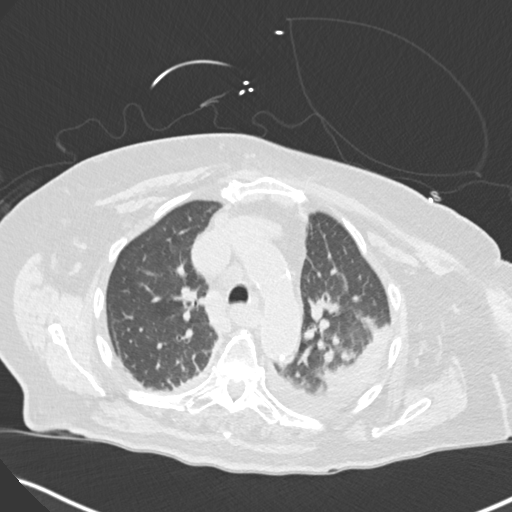
[im 102/131  lung]
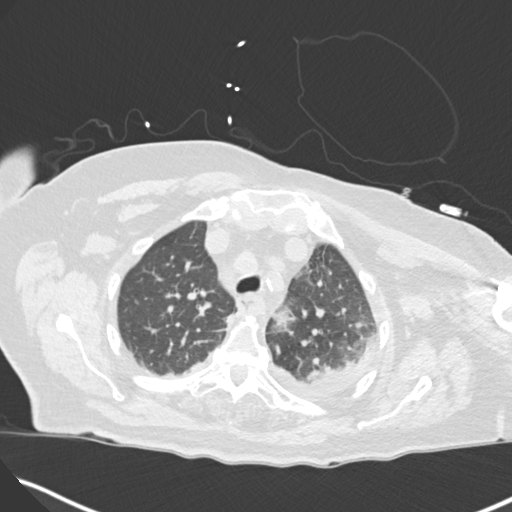
[im 111/131  lung]
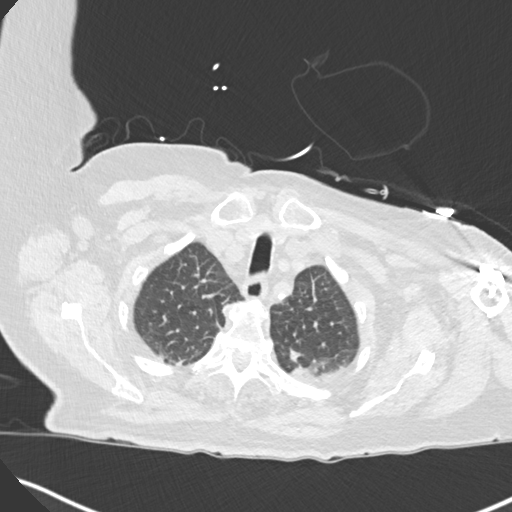
[im 121/131  lung]
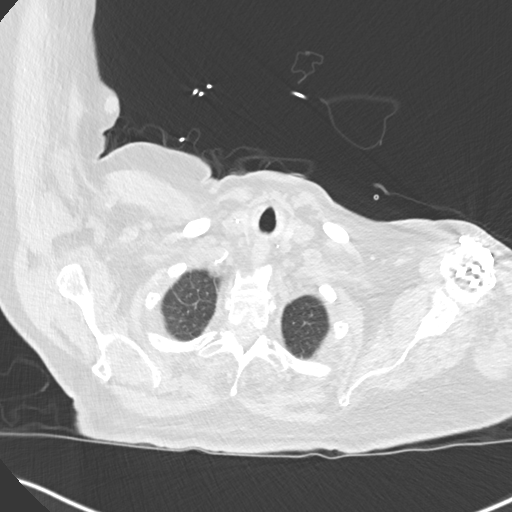

[Series 6: coronal · coronal · 0.55mm/px · 3 of 151 slices shown]
[im 31/151  lung]
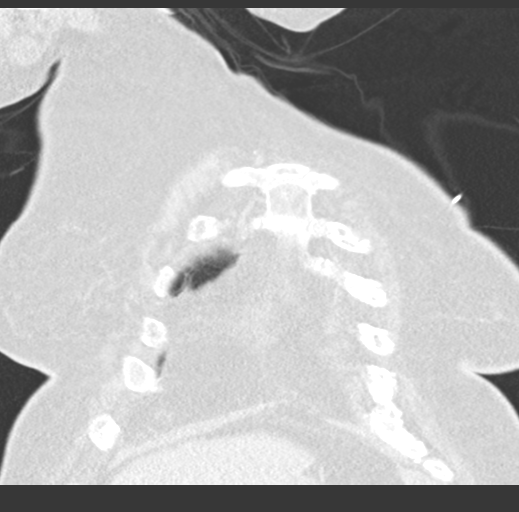
[im 61/151  lung]
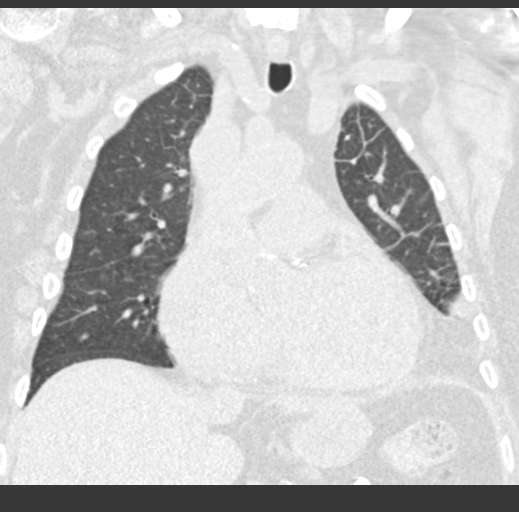
[im 91/151  lung]
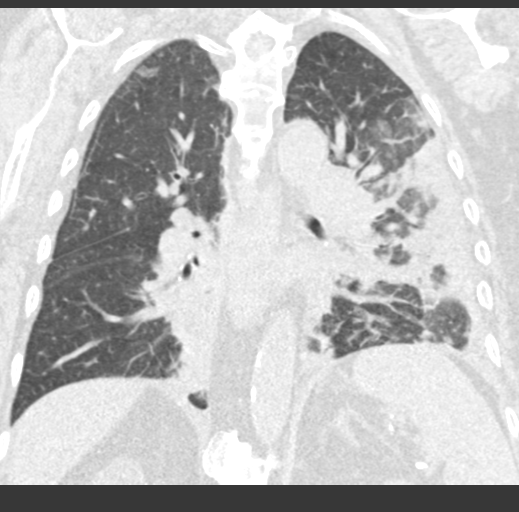

[15 of 36 positions shown; findings below may reference images not displayed]

FINDINGS: Cardiovascular: Cardiac enlargement. No pericardial effusion. Aortic
atherosclerosis. Lad, left circumflex and RCA coronary artery
calcifications.

Mediastinum/Nodes: Normal appearance of the thyroid gland. The
trachea appears patent and is midline. Normal appearance of the
esophagus. Prominent mediastinal lymph nodes measuring up to 1.2 cm
within the right paratracheal chain noted. No adenopathy. The hilar
structures are suboptimally evaluated due to lack of IV contrast.

Lungs/Pleura: There are small bilateral pleural effusions.
Interlobular septal thickening is identified bilaterally compatible
with interstitial edema. Airspace consolidation and subsegmental
atelectasis noted in both lower lobes, lingula and posterolateral
left upper lobe.

Upper Abdomen: No acute abnormality.

Musculoskeletal: No chest wall mass or suspicious bone lesions
identified.
IMPRESSION: 1. Bilateral multifocal airspace consolidation and atelectasis.
Findings consistent with multifocal pneumonia.
2. Cardiac enlargement, 3 vessel coronary artery calcifications,
pulmonary edema, and bilateral pleural effusions compatible with
CHF.
3.  Aortic Atherosclerosis ([id number]).

## 2020-09-04 IMAGING — DX CHEST - 2 VIEW
2 series · 2 of 2 positions shown · non-contrast
Comparison: CT 11/07/2018, plain film 11/06/2018

CLINICAL DATA: 77-year-old female with a history of weakness

EXAM:
CHEST - 2 VIEW

[chest pa]
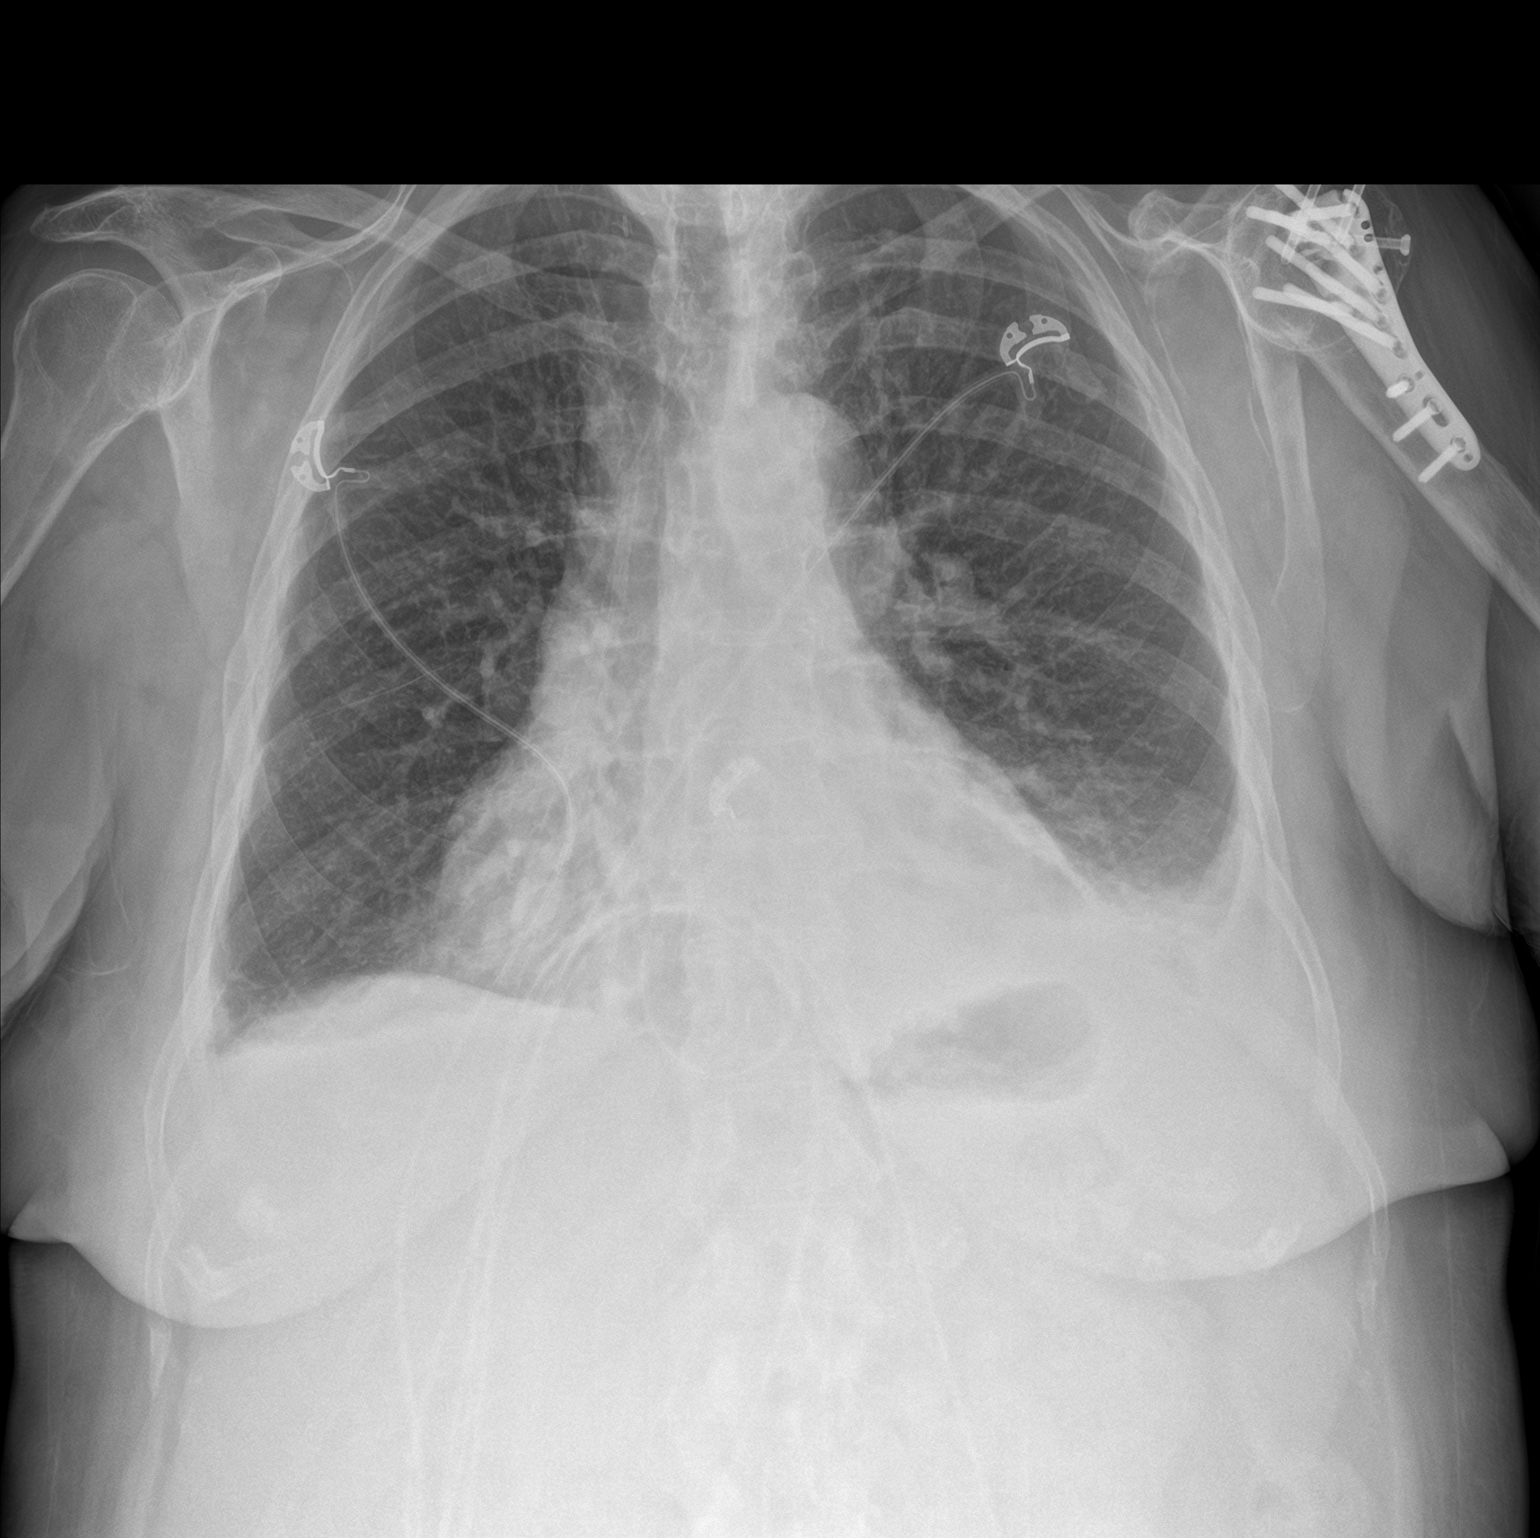

[chest lat]
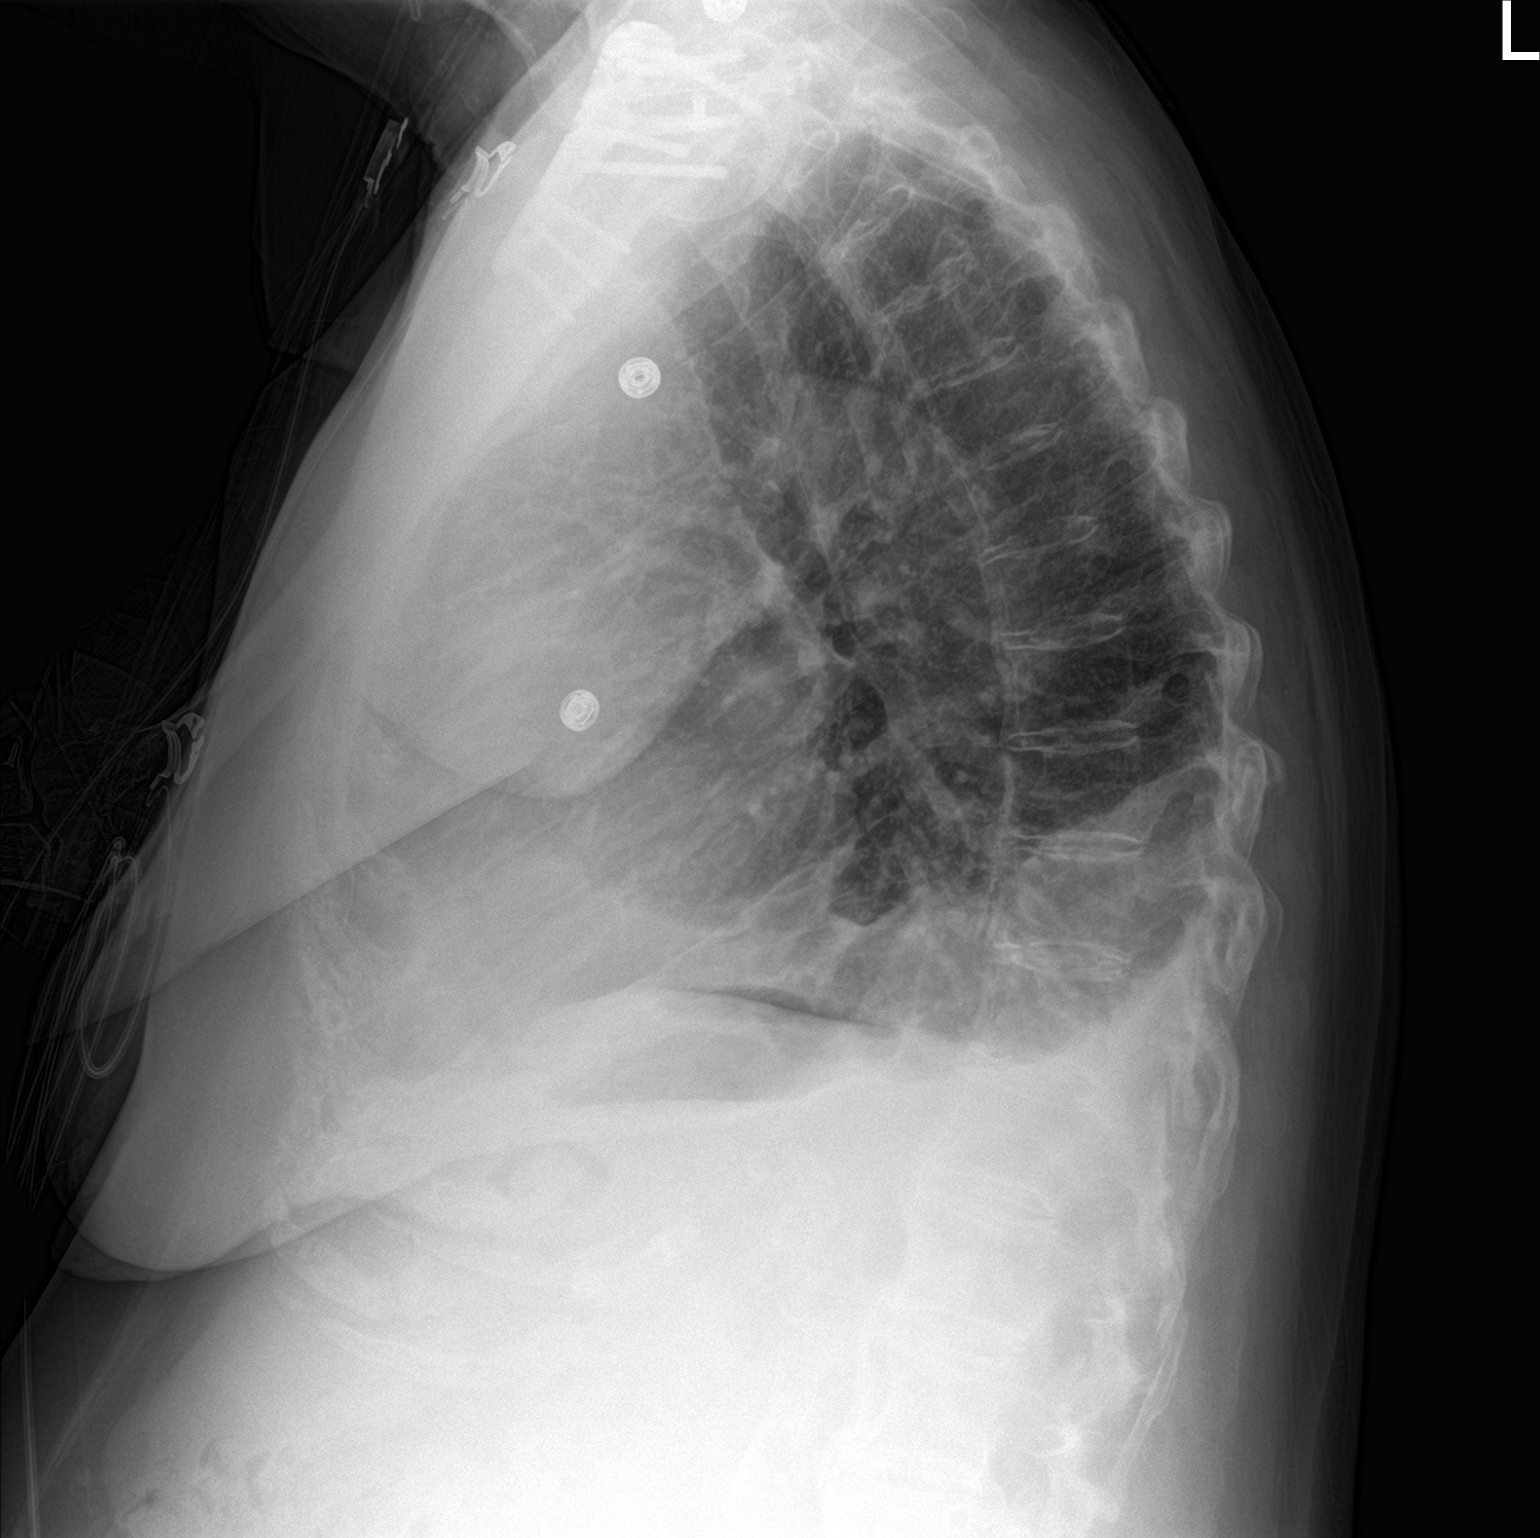

[2 of 2 positions shown; findings below may reference images not displayed]

FINDINGS: Cardiomediastinal silhouette unchanged in size and contour.

Improved aeration at the right lung base with blunting of the right
costophrenic angle and cardiophrenic angle. Persistent opacity at
the left lung base obscuring the left hemidiaphragm with blunting at
the left costophrenic angle. Meniscus at the bilateral lung bases on
the lateral view.

No pneumothorax. Coarsened interstitial markings throughout. No
interlobular septal thickening.

Surgical changes of the left humerus. Degenerative changes of the
spine. No displaced fracture
IMPRESSION: Bilateral pleural effusions, larger on the left, with associated
atelectasis/consolidation.
